# Patient Record
Sex: Female | Born: 1960 | Race: Black or African American | Hispanic: No | Smoking: Former smoker
Health system: Southern US, Community
[De-identification: ages and names within clinical notes are randomized; demographics above are authoritative.]

## PROBLEM LIST (undated history)

## (undated) DIAGNOSIS — I10 Essential (primary) hypertension: Secondary | ICD-10-CM

## (undated) DIAGNOSIS — I700 Atherosclerosis of aorta: Secondary | ICD-10-CM

## (undated) DIAGNOSIS — N186 End stage renal disease: Secondary | ICD-10-CM

## (undated) DIAGNOSIS — I63342 Cerebral infarction due to thrombosis of left cerebellar artery: Secondary | ICD-10-CM

## (undated) DIAGNOSIS — D509 Iron deficiency anemia, unspecified: Secondary | ICD-10-CM

## (undated) DIAGNOSIS — K429 Umbilical hernia without obstruction or gangrene: Secondary | ICD-10-CM

## (undated) DIAGNOSIS — D259 Leiomyoma of uterus, unspecified: Secondary | ICD-10-CM

## (undated) DIAGNOSIS — R110 Nausea: Secondary | ICD-10-CM

## (undated) DIAGNOSIS — K5790 Diverticulosis of intestine, part unspecified, without perforation or abscess without bleeding: Secondary | ICD-10-CM

## (undated) DIAGNOSIS — E876 Hypokalemia: Secondary | ICD-10-CM

## (undated) DIAGNOSIS — K219 Gastro-esophageal reflux disease without esophagitis: Secondary | ICD-10-CM

## (undated) DIAGNOSIS — E785 Hyperlipidemia, unspecified: Secondary | ICD-10-CM

## (undated) DIAGNOSIS — M1710 Unilateral primary osteoarthritis, unspecified knee: Secondary | ICD-10-CM

## (undated) DIAGNOSIS — I4729 Other ventricular tachycardia: Secondary | ICD-10-CM

## (undated) DIAGNOSIS — I472 Ventricular tachycardia, unspecified: Secondary | ICD-10-CM

## (undated) DIAGNOSIS — E1143 Type 2 diabetes mellitus with diabetic autonomic (poly)neuropathy: Secondary | ICD-10-CM

## (undated) DIAGNOSIS — N289 Disorder of kidney and ureter, unspecified: Secondary | ICD-10-CM

## (undated) DIAGNOSIS — Z972 Presence of dental prosthetic device (complete) (partial): Secondary | ICD-10-CM

## (undated) HISTORY — DX: Essential (primary) hypertension: I10

## (undated) HISTORY — PX: CATARACT EXTRACTION: SUR2

## (undated) HISTORY — DX: Atherosclerosis of aorta: I70.0

## (undated) HISTORY — PX: REDUCTION MAMMAPLASTY: SUR839

## (undated) HISTORY — PX: CHOLECYSTECTOMY: SHX55

## (undated) HISTORY — DX: Type 2 diabetes mellitus with diabetic autonomic (poly)neuropathy: E11.43

## (undated) HISTORY — PX: MULTIPLE TOOTH EXTRACTIONS: SHX2053

## (undated) HISTORY — DX: Hyperlipidemia, unspecified: E78.5

## (undated) HISTORY — DX: Umbilical hernia without obstruction or gangrene: K42.9

## (undated) HISTORY — DX: Unilateral primary osteoarthritis, unspecified knee: M17.10

## (undated) HISTORY — DX: Other ventricular tachycardia: I47.29

## (undated) HISTORY — DX: Iron deficiency anemia, unspecified: D50.9

## (undated) HISTORY — DX: Nausea: R11.0

## (undated) HISTORY — DX: Hypokalemia: E87.6

## (undated) HISTORY — PX: BREAST SURGERY: SHX581

## (undated) HISTORY — DX: Ventricular tachycardia, unspecified: I47.20

## (undated) HISTORY — DX: Diverticulosis of intestine, part unspecified, without perforation or abscess without bleeding: K57.90

## (undated) HISTORY — DX: Leiomyoma of uterus, unspecified: D25.9

## (undated) HISTORY — DX: Gastro-esophageal reflux disease without esophagitis: K21.9

---

## 2000-08-28 ENCOUNTER — Encounter: Payer: Self-pay | Admitting: Emergency Medicine

## 2000-08-28 ENCOUNTER — Emergency Department (HOSPITAL_COMMUNITY): Admission: EM | Admit: 2000-08-28 | Discharge: 2000-08-28 | Payer: Self-pay | Admitting: Emergency Medicine

## 2000-11-02 ENCOUNTER — Emergency Department (HOSPITAL_COMMUNITY): Admission: EM | Admit: 2000-11-02 | Discharge: 2000-11-02 | Payer: Self-pay | Admitting: Emergency Medicine

## 2001-03-22 ENCOUNTER — Emergency Department (HOSPITAL_COMMUNITY): Admission: EM | Admit: 2001-03-22 | Discharge: 2001-03-22 | Payer: Self-pay | Admitting: Emergency Medicine

## 2001-06-07 ENCOUNTER — Encounter: Payer: Self-pay | Admitting: Emergency Medicine

## 2001-06-07 ENCOUNTER — Emergency Department (HOSPITAL_COMMUNITY): Admission: EM | Admit: 2001-06-07 | Discharge: 2001-06-07 | Payer: Self-pay | Admitting: Emergency Medicine

## 2001-06-13 ENCOUNTER — Emergency Department (HOSPITAL_COMMUNITY): Admission: EM | Admit: 2001-06-13 | Discharge: 2001-06-13 | Payer: Self-pay | Admitting: Emergency Medicine

## 2001-09-06 ENCOUNTER — Encounter: Payer: Self-pay | Admitting: Emergency Medicine

## 2001-09-06 ENCOUNTER — Emergency Department (HOSPITAL_COMMUNITY): Admission: EM | Admit: 2001-09-06 | Discharge: 2001-09-06 | Payer: Self-pay | Admitting: Emergency Medicine

## 2001-10-16 ENCOUNTER — Emergency Department (HOSPITAL_COMMUNITY): Admission: EM | Admit: 2001-10-16 | Discharge: 2001-10-16 | Payer: Self-pay | Admitting: Emergency Medicine

## 2002-03-28 ENCOUNTER — Emergency Department (HOSPITAL_COMMUNITY): Admission: EM | Admit: 2002-03-28 | Discharge: 2002-03-28 | Payer: Self-pay | Admitting: Emergency Medicine

## 2003-01-23 ENCOUNTER — Emergency Department (HOSPITAL_COMMUNITY): Admission: EM | Admit: 2003-01-23 | Discharge: 2003-01-23 | Payer: Self-pay | Admitting: Emergency Medicine

## 2004-02-29 ENCOUNTER — Emergency Department (HOSPITAL_COMMUNITY): Admission: EM | Admit: 2004-02-29 | Discharge: 2004-02-29 | Payer: Self-pay | Admitting: Emergency Medicine

## 2004-07-17 IMAGING — CR DG CHEST 1V
1 series · 1 of 1 positions shown · non-contrast
Comparison: None.

CLINICAL DATA: Weakness and headache, code stroke.
 CHEST - 1 VIEW:

[view not recorded]
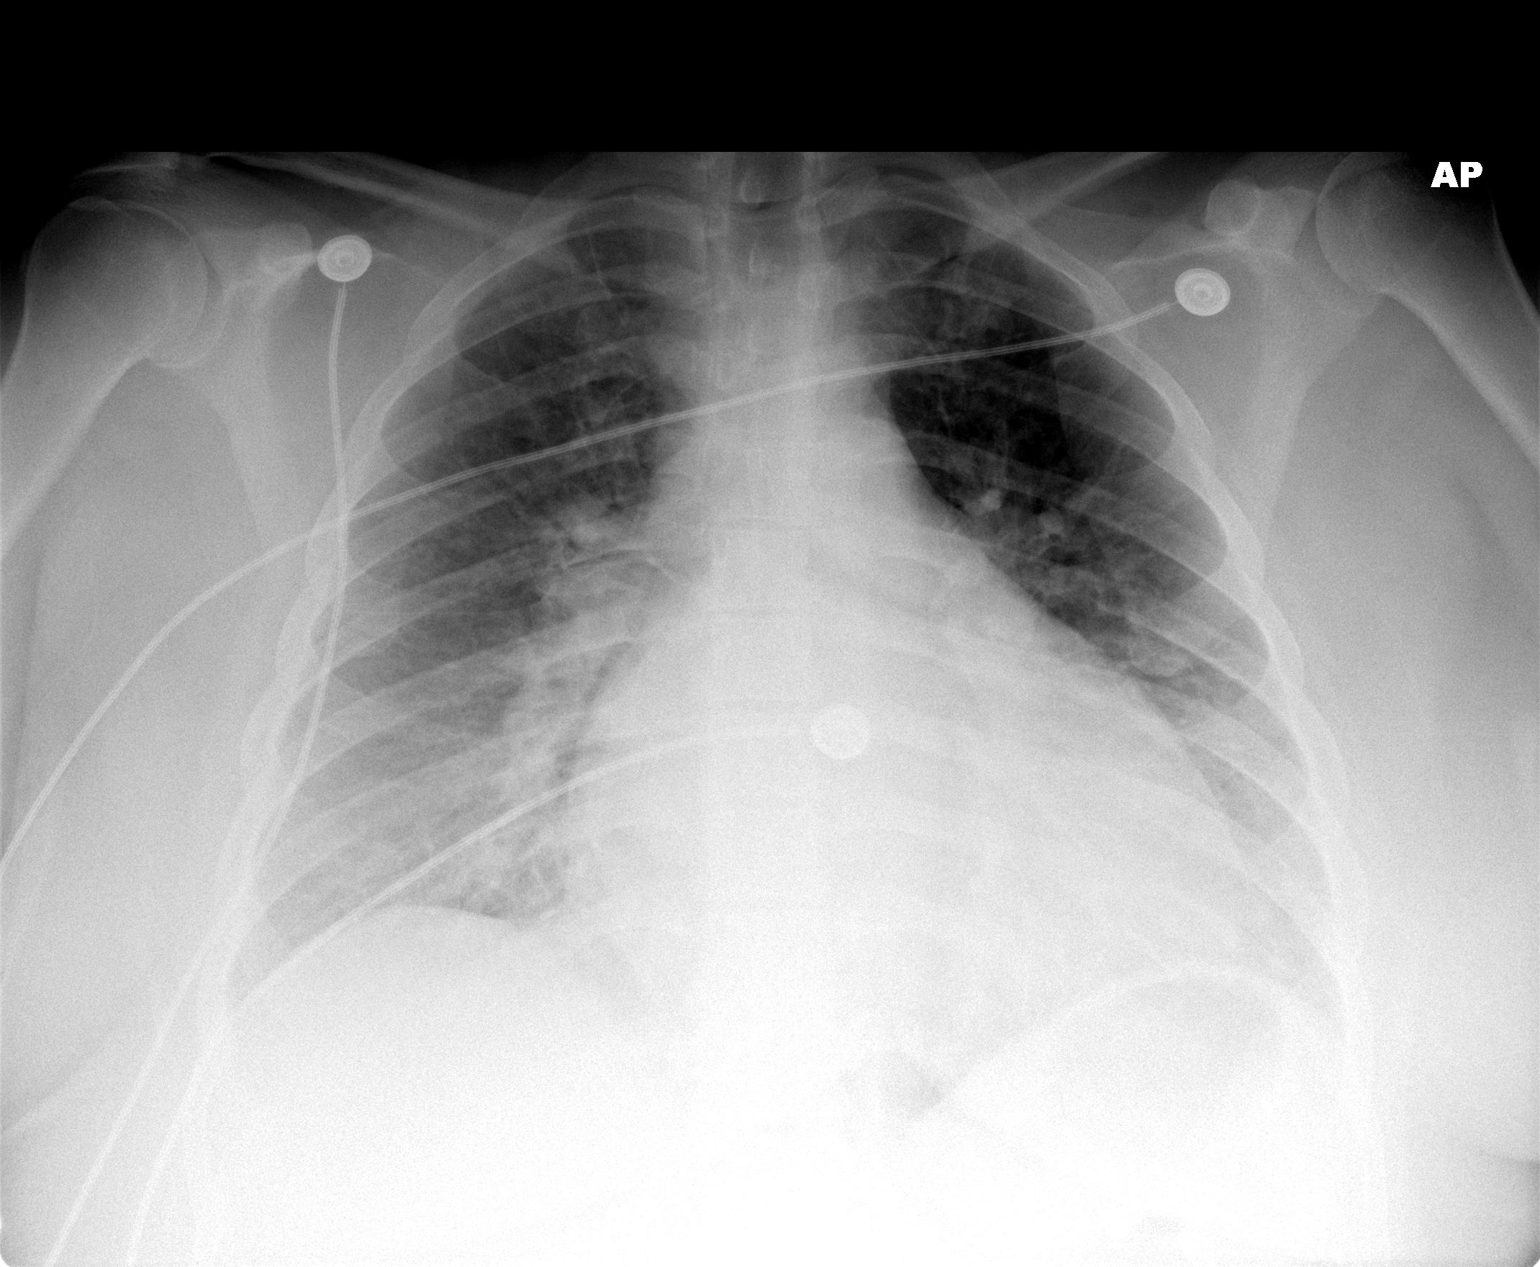

[1 of 1 positions shown; findings below may reference images not displayed]

FINDINGS: Cardiomediastinal silhouette is borderline enlarged.  No definite focal air space opacity is seen; generalized haziness over the bilateral lower lobes is most likely due to overlying soft tissue.  Osseous structures are grossly intact.
IMPRESSION: 1.  Borderline enlarged cardiomediastinal silhouette.
 2.  No definite focal air space opacity.

## 2005-01-23 ENCOUNTER — Emergency Department (HOSPITAL_COMMUNITY): Admission: EM | Admit: 2005-01-23 | Discharge: 2005-01-24 | Payer: Self-pay | Admitting: Emergency Medicine

## 2005-01-23 IMAGING — CT CT HEAD W/O CM
1 series · 16 of 30 positions shown, 20 images · IV contrast (agent unspecified)
Comparison: none

CLINICAL DATA: Mental status changes, code stroke. 
 HEAD CT WITHOUT CONTRAST:
TECHNIQUE: Contiguous axial images were obtained from the base of the skull through the vertex according to standard protocol without contrast.
 There is no evidence of intracranial hemorrhage, brain edema, or mass effect.  No other intra-axial abnormalities are seen, and the ventricles are within normal limits.  No abnormal extra-axial fluid collections or masses are identified.  No skull abnormalities are noted.

[Series 2: head_seq 4.5 h42s st · axial · 0.43mm/px · z∈[+1169,+1313]mm · 16 of 36 slices shown, 20 images]
[im 2/36  brain]
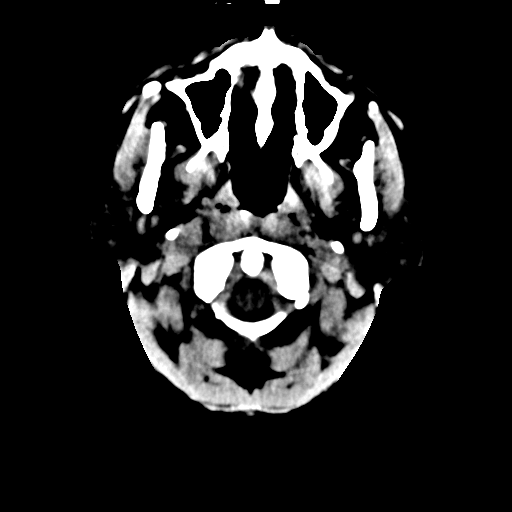
[im 2/36  bone]
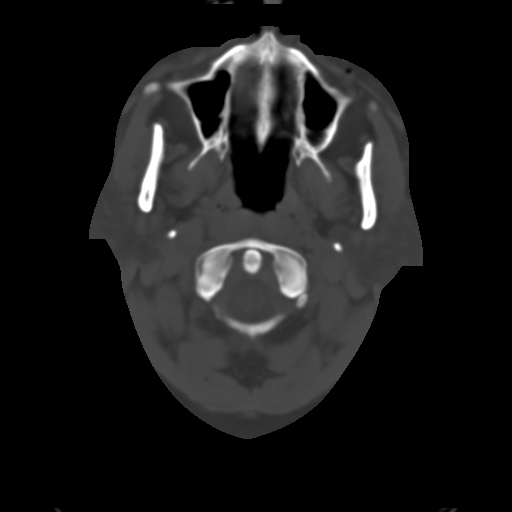
[im 4/36  brain]
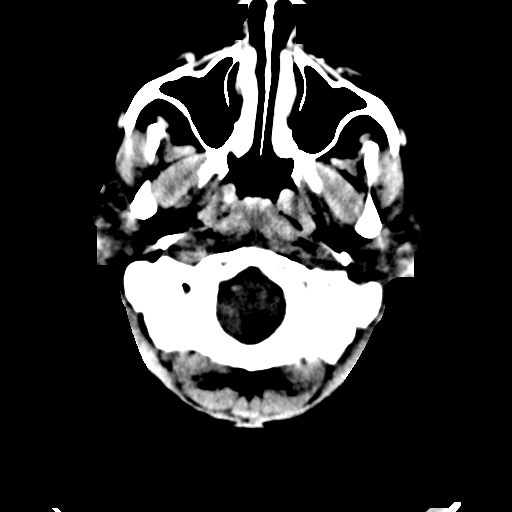
[im 7/36  brain]
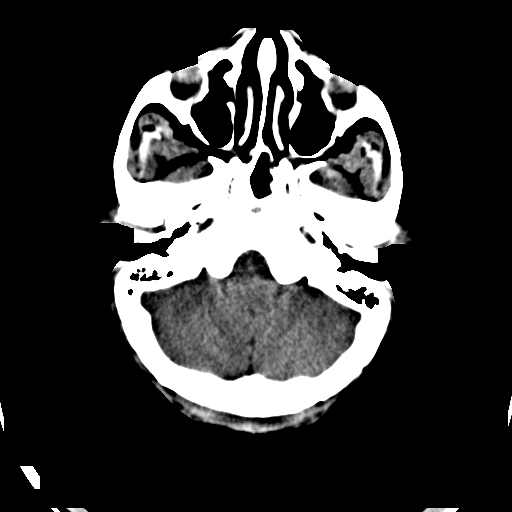
[im 9/36  brain]
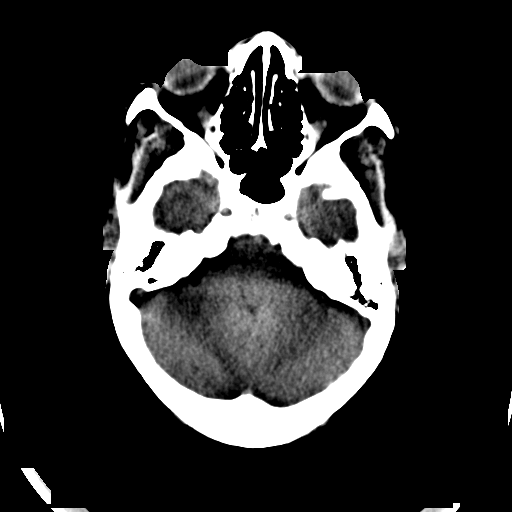
[im 10/36  brain]
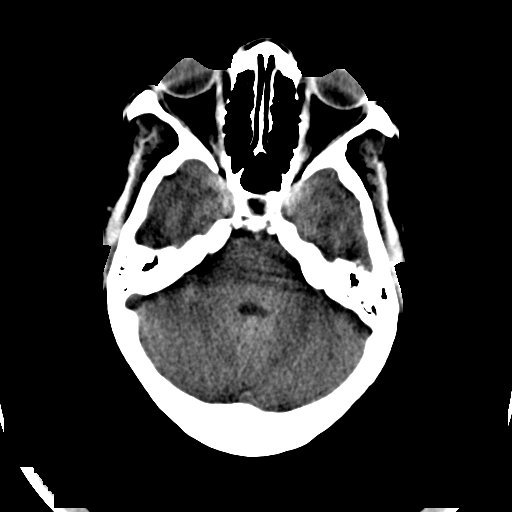
[im 10/36  bone]
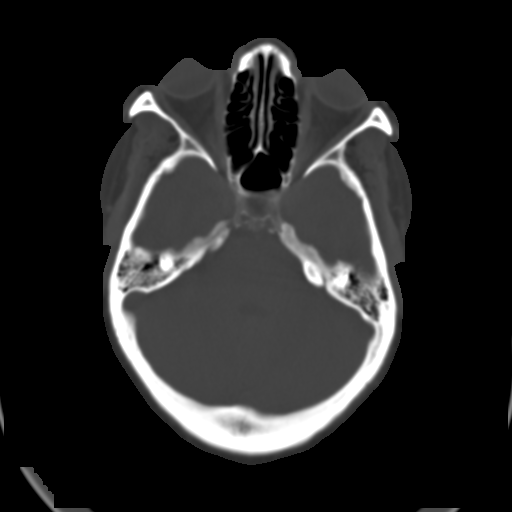
[im 13/36  brain]
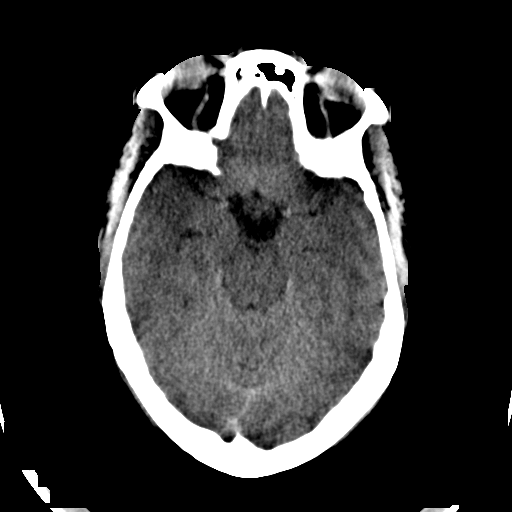
[im 15/36  brain]
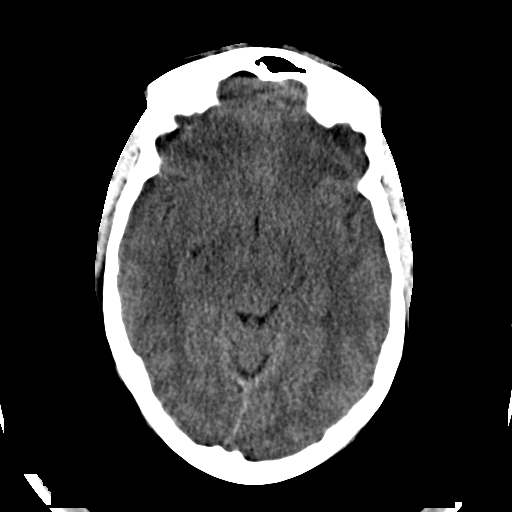
[im 17/36  brain]
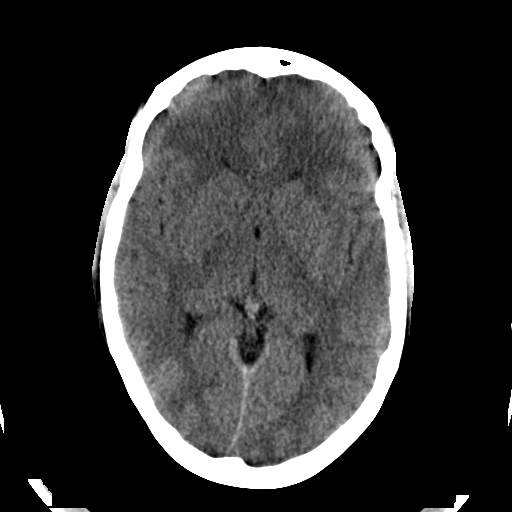
[im 19/36  brain]
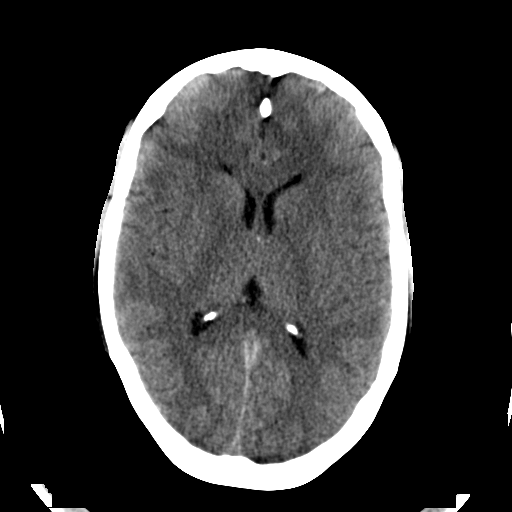
[im 19/36  bone]
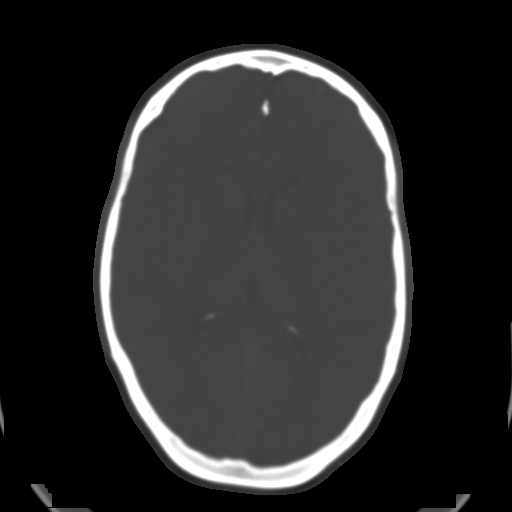
[im 21/36  brain]
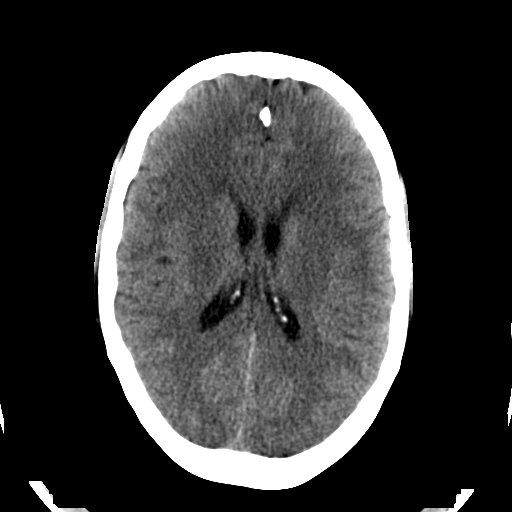
[im 23/36  brain]
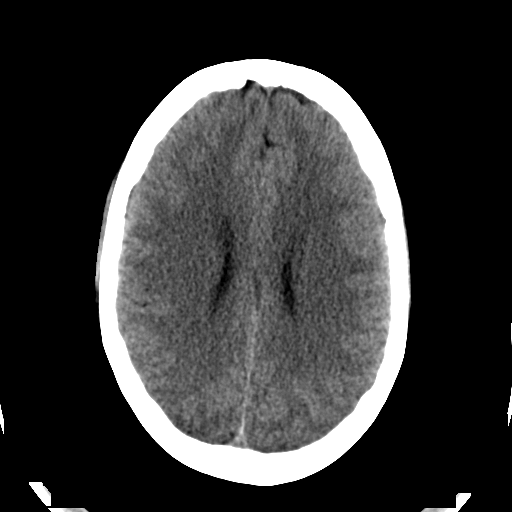
[im 26/36  brain]
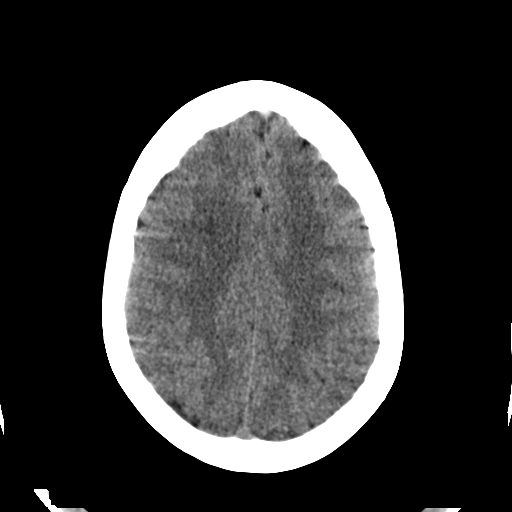
[im 27/36  brain]
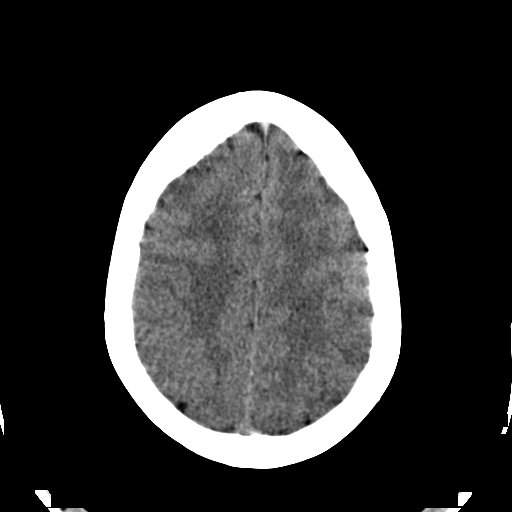
[im 27/36  bone]
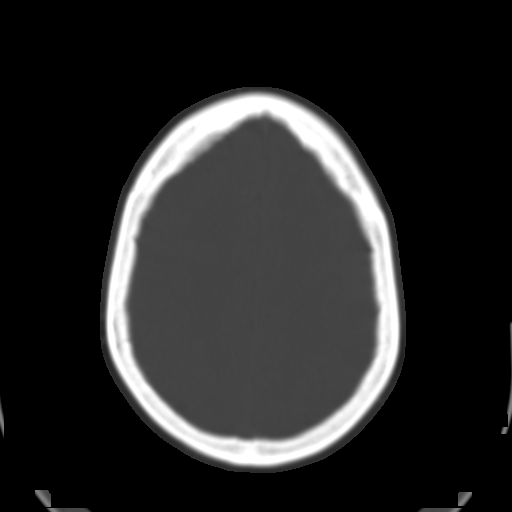
[im 29/36  brain]
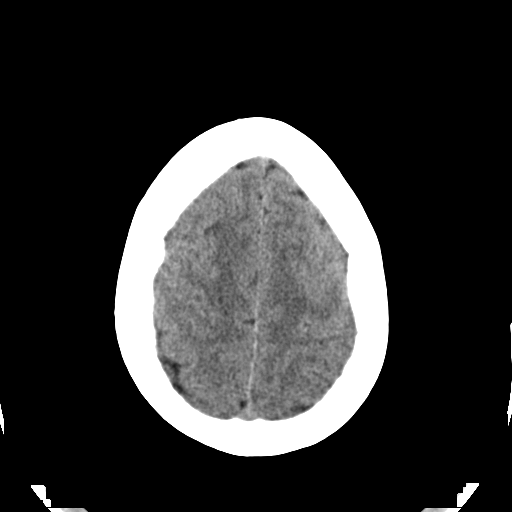
[im 32/36  brain]
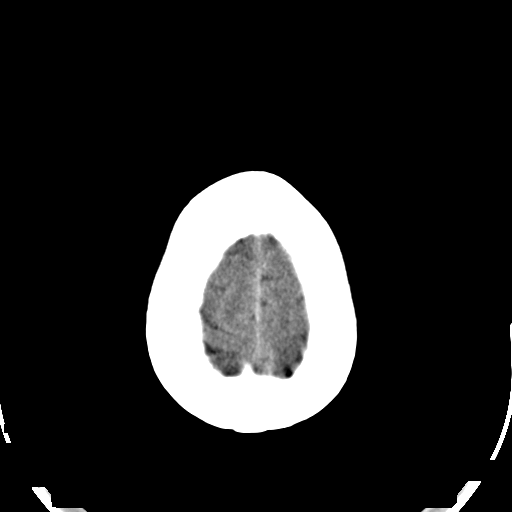
[im 34/36  brain]
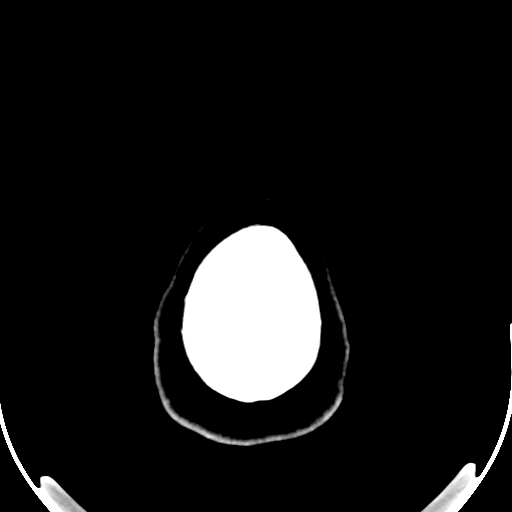

[16 of 30 positions shown; findings below may reference images not displayed]

IMPRESSION: Negative non-contrast head CT.

## 2005-06-04 ENCOUNTER — Emergency Department (HOSPITAL_COMMUNITY): Admission: EM | Admit: 2005-06-04 | Discharge: 2005-06-04 | Payer: Self-pay | Admitting: Emergency Medicine

## 2005-06-15 ENCOUNTER — Emergency Department (HOSPITAL_COMMUNITY): Admission: EM | Admit: 2005-06-15 | Discharge: 2005-06-15 | Payer: Self-pay | Admitting: Emergency Medicine

## 2005-08-06 ENCOUNTER — Emergency Department (HOSPITAL_COMMUNITY): Admission: EM | Admit: 2005-08-06 | Discharge: 2005-08-06 | Payer: Self-pay | Admitting: Emergency Medicine

## 2005-08-16 ENCOUNTER — Emergency Department (HOSPITAL_COMMUNITY): Admission: EM | Admit: 2005-08-16 | Discharge: 2005-08-16 | Payer: Self-pay | Admitting: Family Medicine

## 2005-08-27 ENCOUNTER — Ambulatory Visit: Payer: Self-pay | Admitting: Family Medicine

## 2005-09-03 ENCOUNTER — Encounter (INDEPENDENT_AMBULATORY_CARE_PROVIDER_SITE_OTHER): Payer: Self-pay | Admitting: *Deleted

## 2005-09-24 ENCOUNTER — Other Ambulatory Visit: Admission: RE | Admit: 2005-09-24 | Discharge: 2005-09-24 | Payer: Self-pay | Admitting: Family Medicine

## 2005-09-24 ENCOUNTER — Ambulatory Visit: Payer: Self-pay | Admitting: Family Medicine

## 2005-09-26 ENCOUNTER — Ambulatory Visit: Payer: Self-pay | Admitting: Sports Medicine

## 2006-01-22 ENCOUNTER — Ambulatory Visit: Payer: Self-pay | Admitting: Family Medicine

## 2006-01-22 ENCOUNTER — Ambulatory Visit (HOSPITAL_COMMUNITY): Admission: RE | Admit: 2006-01-22 | Discharge: 2006-01-22 | Payer: Self-pay | Admitting: Family Medicine

## 2006-01-22 IMAGING — CR DG ABDOMEN ACUTE W/ 1V CHEST
4 series · 4 of 4 positions shown · non-contrast
Comparison: none

CLINICAL DATA: Abdominal pain, history of cholecystectomy.  
 ACUTE ABDOMINAL SERIES WITH CHEST:

[view not recorded (1 of 4)]
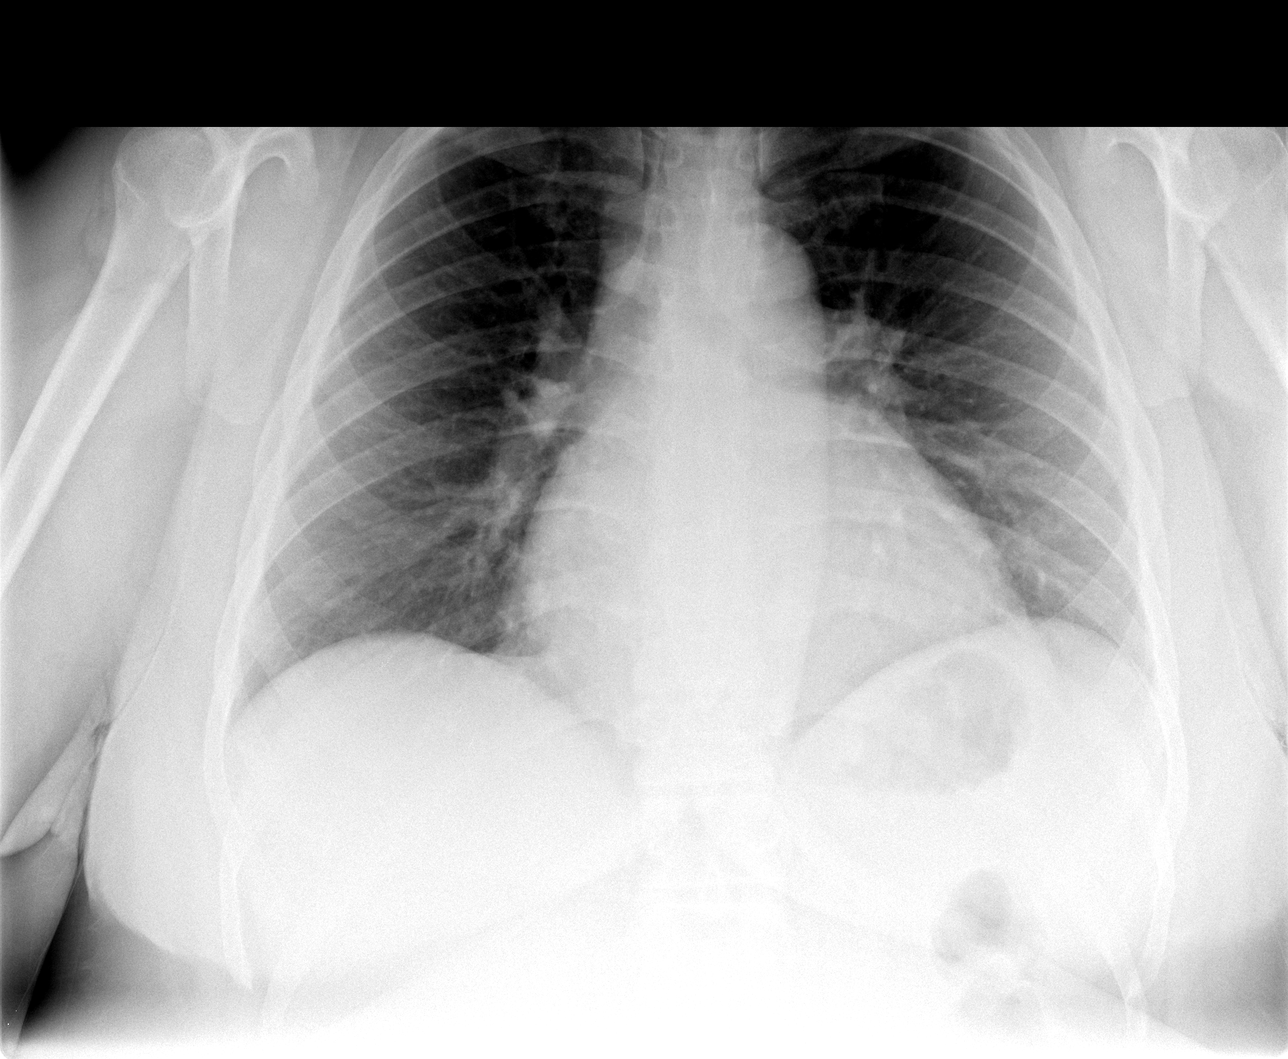

[view not recorded (2 of 4)]
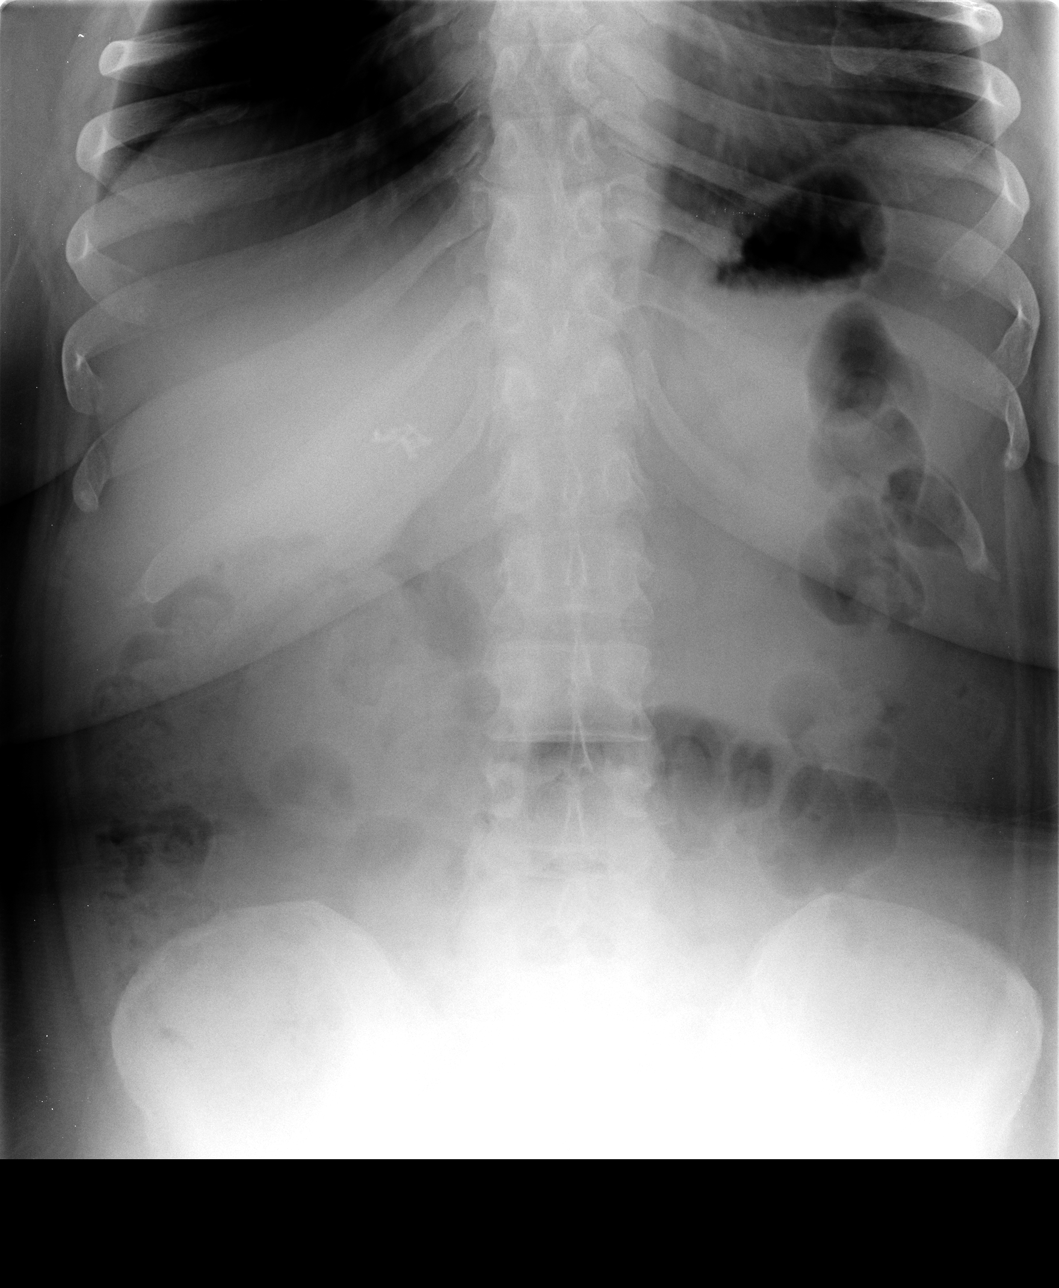

[view not recorded (3 of 4)]
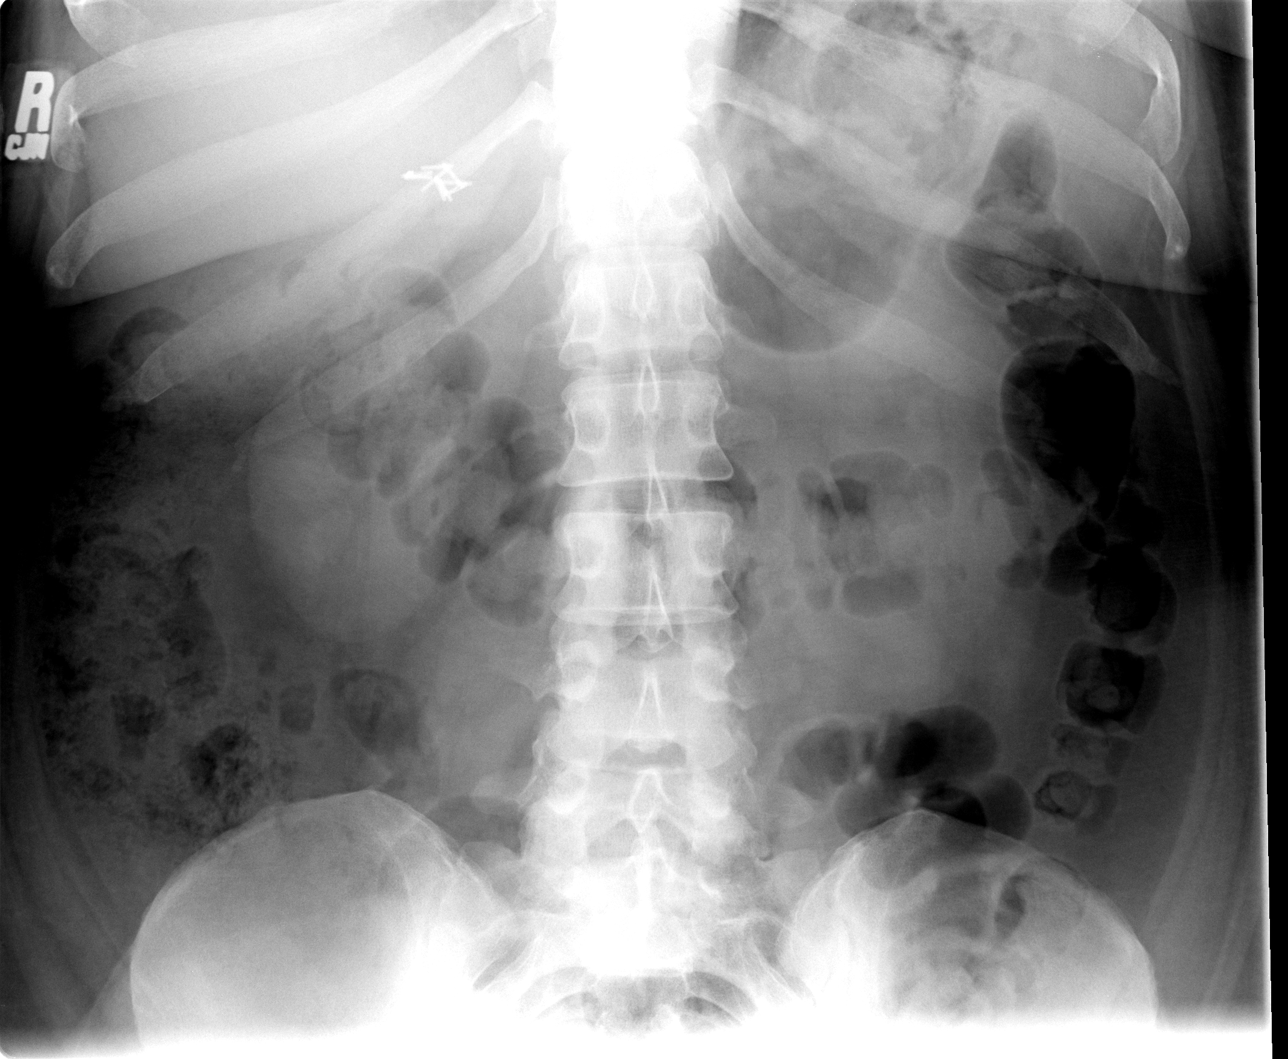

[view not recorded (4 of 4)]
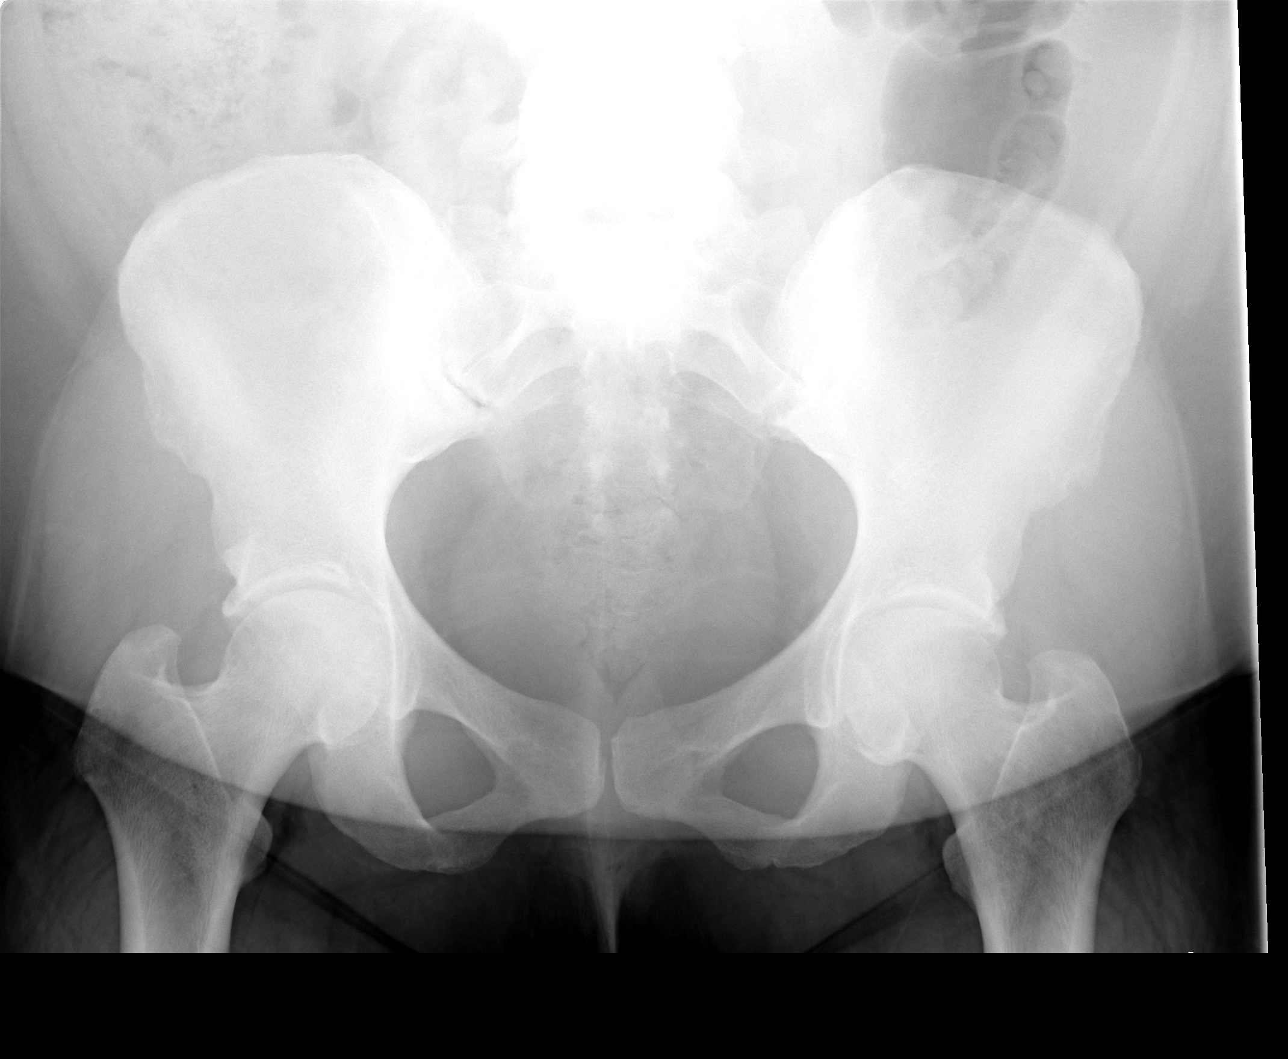

[4 of 4 positions shown; findings below may reference images not displayed]

FINDINGS: The bowel gas pattern is normal.  There are surgical clips within the upper quadrant from a prior cholecystectomy.  There is no evidence for free peritoneal air.  
 The upright chest view included in the study demonstrates borderline cardiomegaly.  There are no infiltrative or edematous changes.
IMPRESSION: 1.  Normal bowel gas pattern and no evidence for free peritoneal air. 
 2.  Borderline cardiomegaly.

## 2006-03-23 ENCOUNTER — Emergency Department (HOSPITAL_COMMUNITY): Admission: EM | Admit: 2006-03-23 | Discharge: 2006-03-24 | Payer: Self-pay | Admitting: Emergency Medicine

## 2006-03-23 IMAGING — CR DG NECK SOFT TISSUE
2 series · 2 of 2 positions shown · non-contrast
Comparison: none

CLINICAL DATA: The patient has food stuck in throat.
 LATERAL NECK SOFT TISSUES - 1 VIEW:

[w soft tissue neck * (1 of 2)]
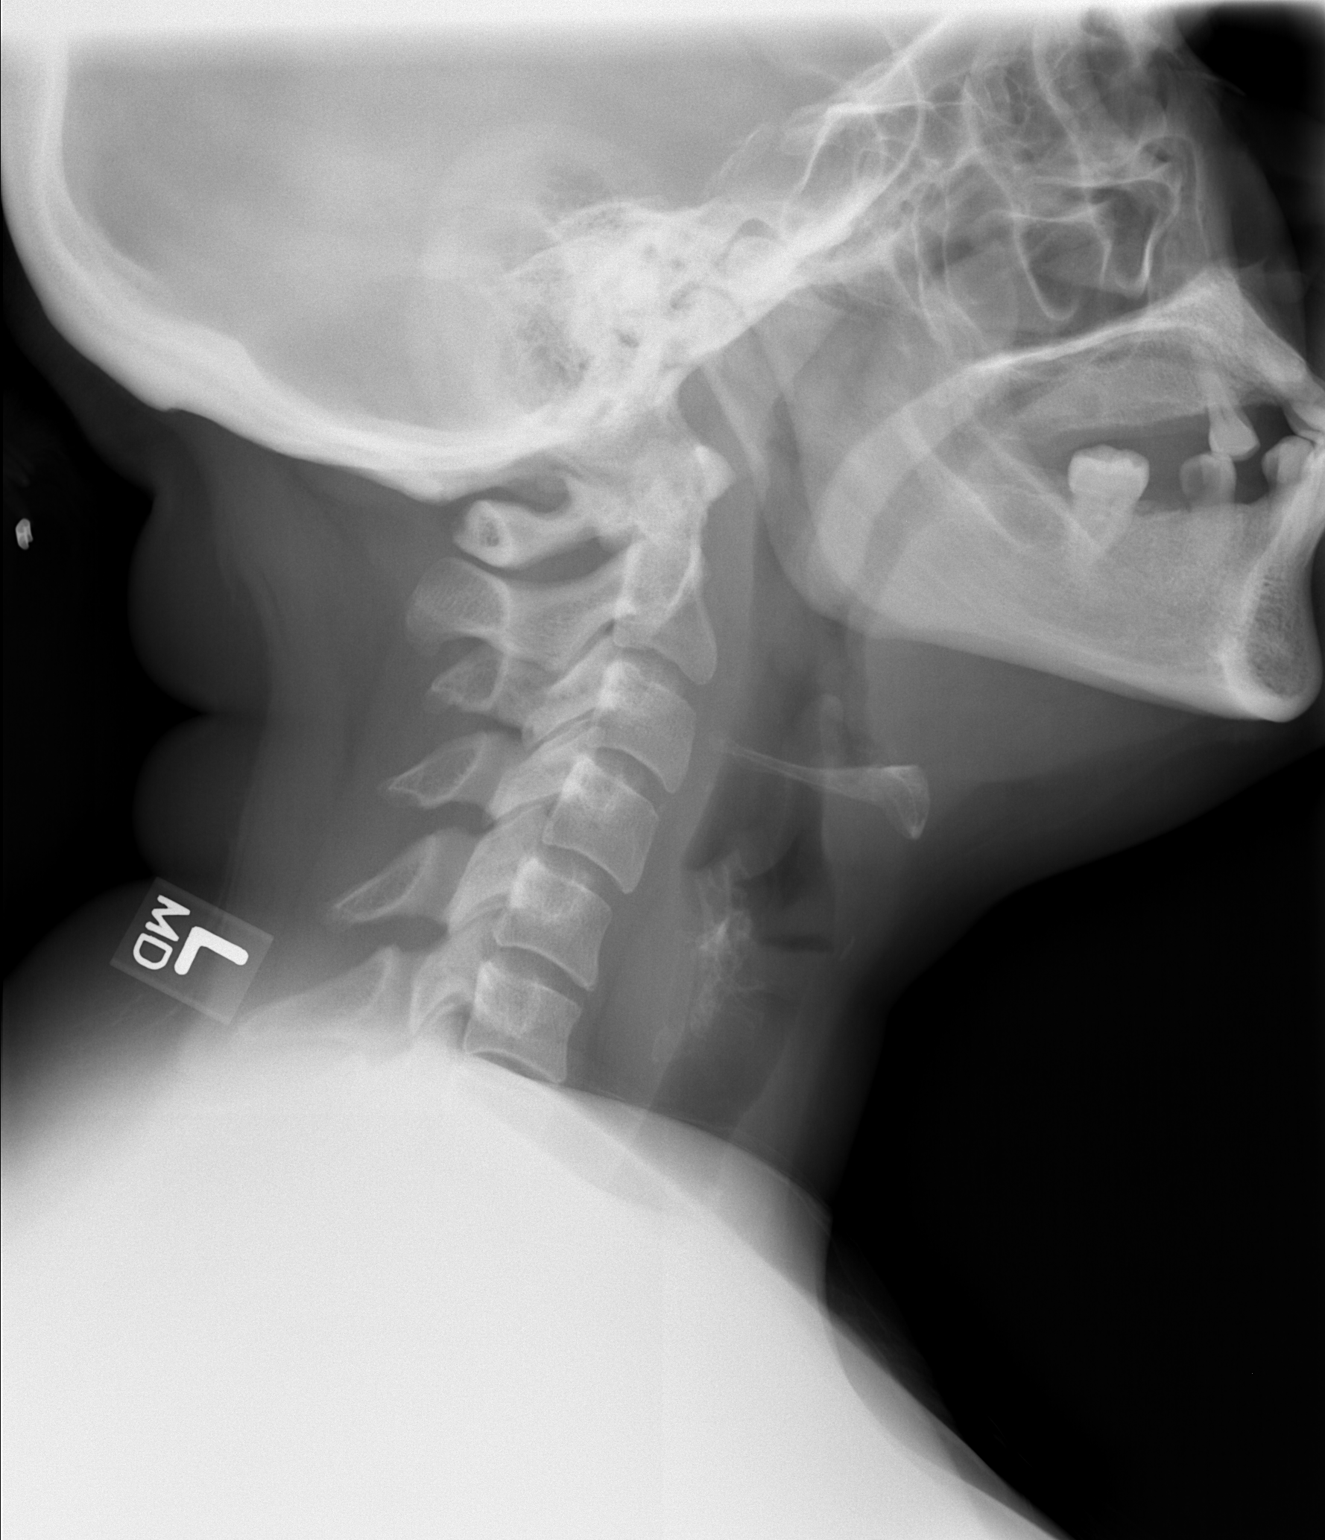

[w soft tissue neck * (2 of 2)]
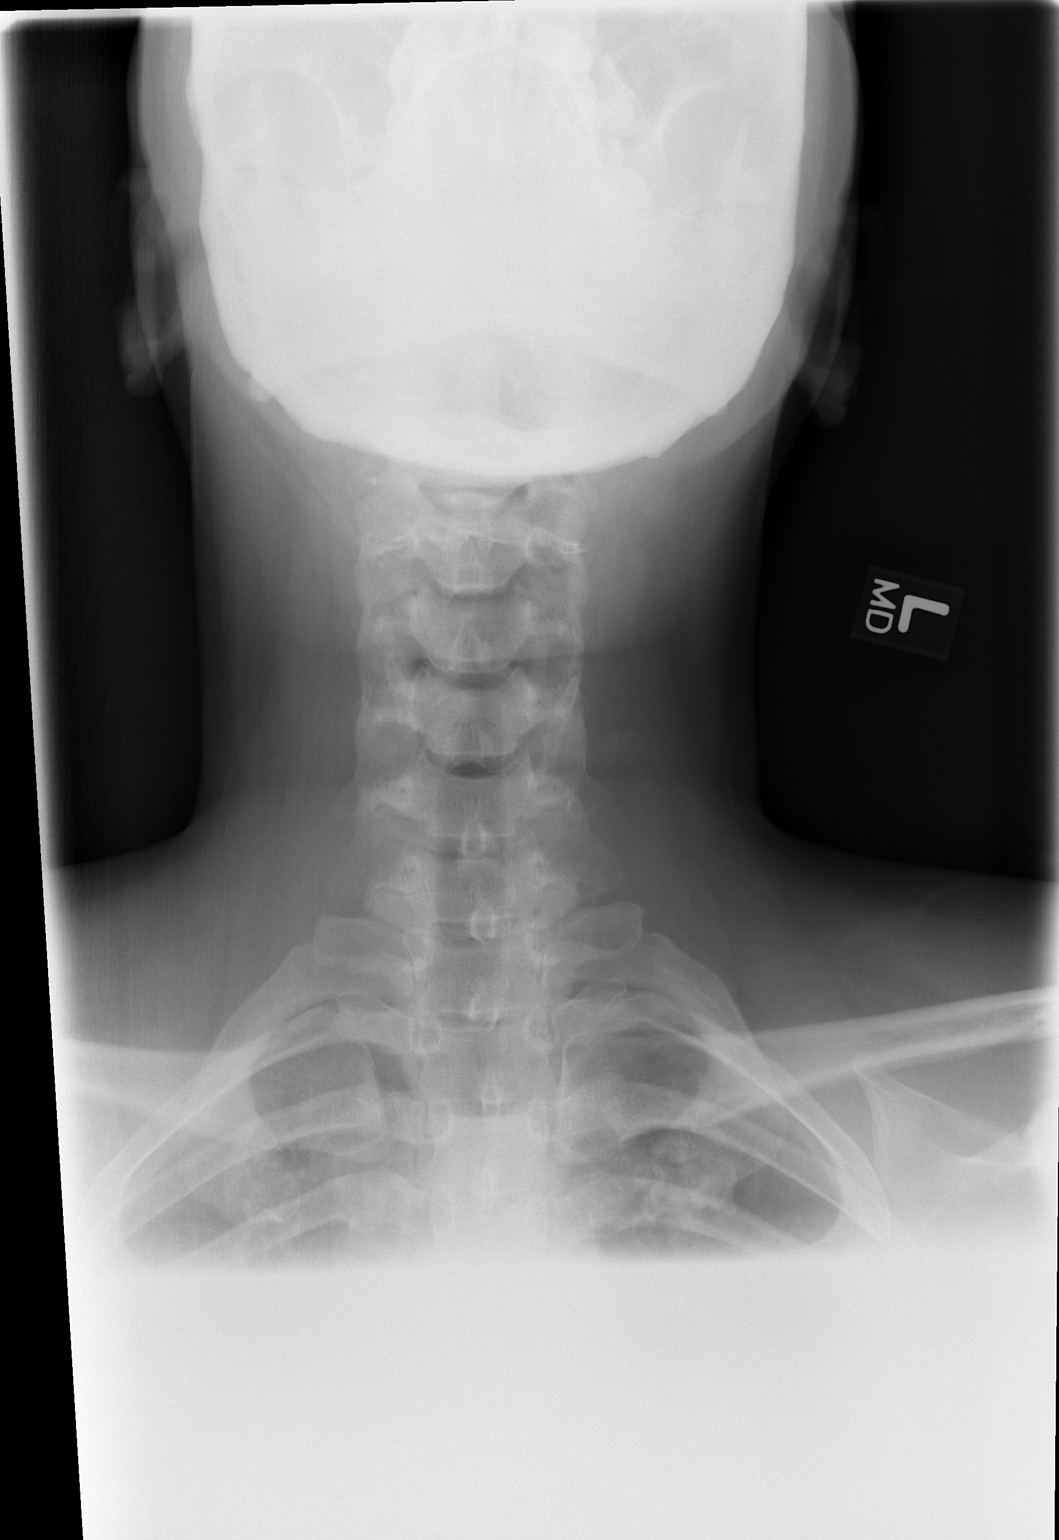

[2 of 2 positions shown; findings below may reference images not displayed]

FINDINGS: There is no radiopaque foreign body or food substance seen within the esophagus or tracheal air column.  Straightening of the cervical spine is noted.
IMPRESSION: Negative for obstructing food substance.

## 2006-07-02 ENCOUNTER — Emergency Department (HOSPITAL_COMMUNITY): Admission: EM | Admit: 2006-07-02 | Discharge: 2006-07-02 | Payer: Self-pay | Admitting: Emergency Medicine

## 2006-07-03 DIAGNOSIS — G44209 Tension-type headache, unspecified, not intractable: Secondary | ICD-10-CM | POA: Insufficient documentation

## 2006-07-03 DIAGNOSIS — Z87891 Personal history of nicotine dependence: Secondary | ICD-10-CM

## 2006-07-03 DIAGNOSIS — I10 Essential (primary) hypertension: Secondary | ICD-10-CM

## 2006-07-03 HISTORY — DX: Essential (primary) hypertension: I10

## 2006-07-04 ENCOUNTER — Encounter (INDEPENDENT_AMBULATORY_CARE_PROVIDER_SITE_OTHER): Payer: Self-pay | Admitting: *Deleted

## 2006-07-04 ENCOUNTER — Ambulatory Visit: Payer: Self-pay | Admitting: Family Medicine

## 2006-07-14 ENCOUNTER — Telehealth: Payer: Self-pay | Admitting: *Deleted

## 2006-10-16 ENCOUNTER — Telehealth: Payer: Self-pay | Admitting: *Deleted

## 2006-10-16 ENCOUNTER — Ambulatory Visit: Payer: Self-pay | Admitting: Family Medicine

## 2006-10-16 ENCOUNTER — Encounter (INDEPENDENT_AMBULATORY_CARE_PROVIDER_SITE_OTHER): Payer: Self-pay | Admitting: Family Medicine

## 2006-10-16 DIAGNOSIS — R7989 Other specified abnormal findings of blood chemistry: Secondary | ICD-10-CM | POA: Insufficient documentation

## 2006-10-16 LAB — CONVERTED CEMR LAB
BUN: 9 mg/dL (ref 6–23)
Bilirubin Urine: NEGATIVE
Blood Glucose, Fingerstick: 111
Blood in Urine, dipstick: NEGATIVE
CO2: 22 meq/L (ref 19–32)
Calcium: 8.9 mg/dL (ref 8.4–10.5)
Glucose, Bld: 90 mg/dL (ref 70–99)
Glucose, Urine, Semiquant: NEGATIVE
Hgb A1c MFr Bld: 6.2 %
Ketones, urine, test strip: NEGATIVE
Specific Gravity, Urine: 1.025
pH: 6.5

## 2006-10-29 ENCOUNTER — Emergency Department (HOSPITAL_COMMUNITY): Admission: EM | Admit: 2006-10-29 | Discharge: 2006-10-30 | Payer: Self-pay | Admitting: Emergency Medicine

## 2006-10-29 IMAGING — CT CT ANGIO CHEST
2 of 4 series · 19 of 36 positions shown · IV contrast (APPLIED)
Comparison: None.

CLINICAL DATA: Chest pain.   Shortness of breath and elevated D-dimer.  
 CT ANGIOGRAPHY OF CHEST:
TECHNIQUE: Multidetector CT imaging of the chest was performed during bolus injection of intravenous contrast.  Multiplanar CT angiographic image reconstructions were generated to evaluate the vascular anatomy.
 Contrast:  100 cc Omnipaque 300

[Series 4: pulm embolism 2.0 st · axial · 0.67mm/px · z∈[-376,-80]mm · 16 of 162 slices shown]
[im 7/162  lung]
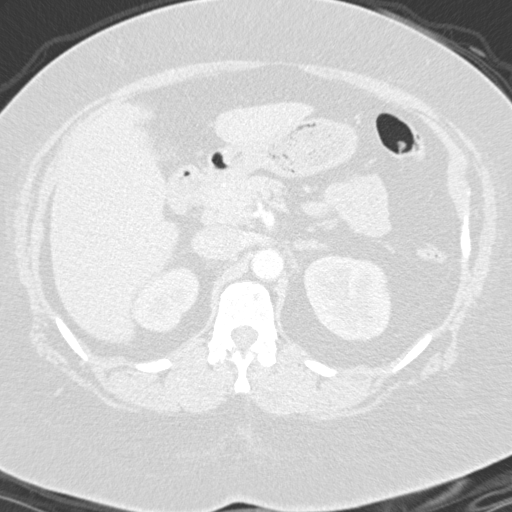
[im 19/162  mediastinal]
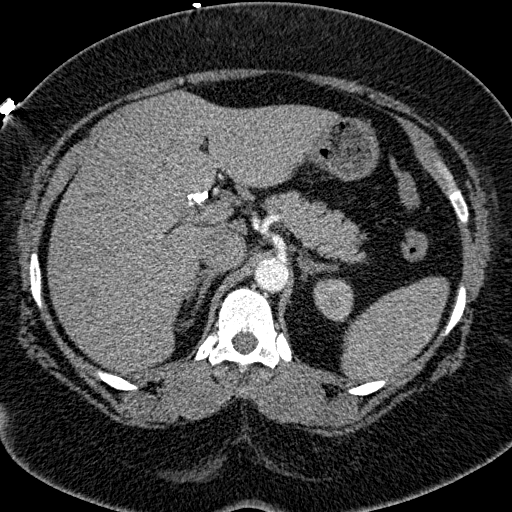
[im 25/162  lung]
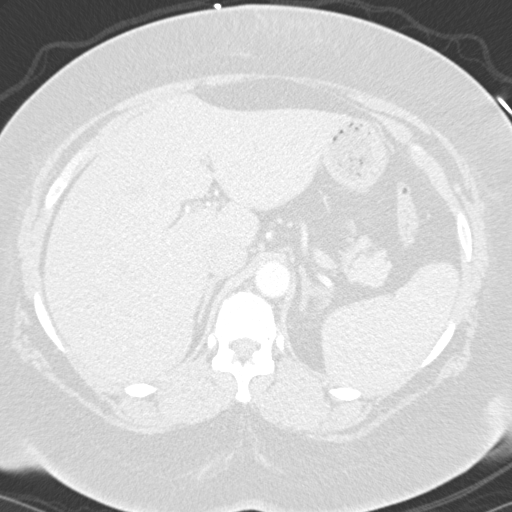
[im 38/162  mediastinal]
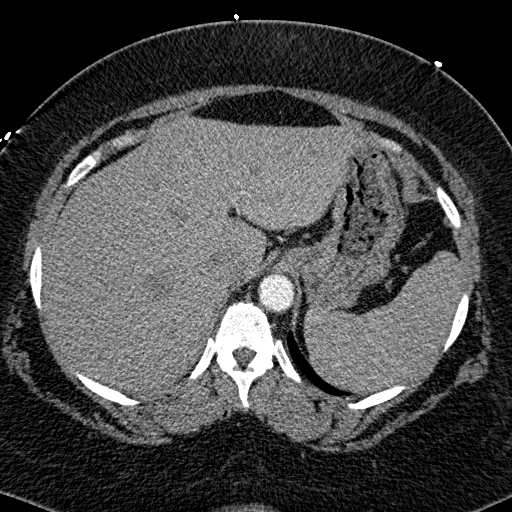
[im 50/162  lung]
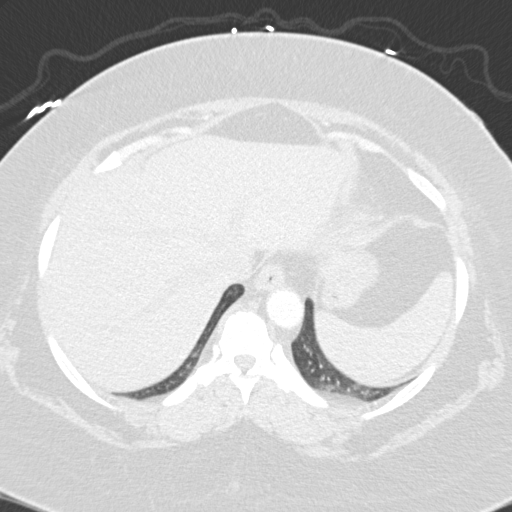
[im 56/162  mediastinal]
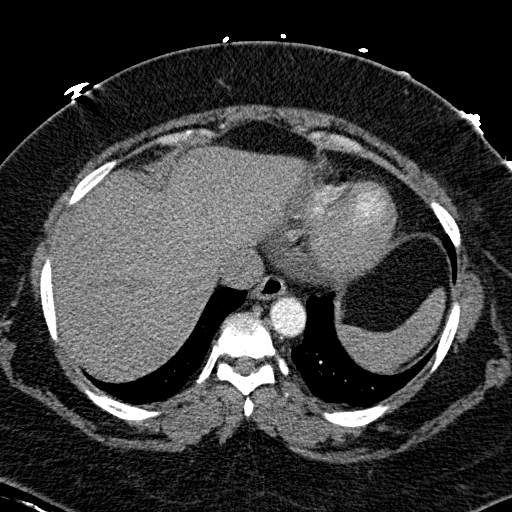
[im 69/162  lung]
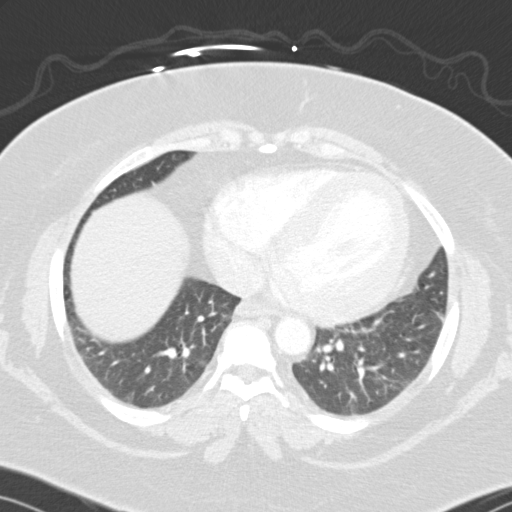
[im 75/162  mediastinal]
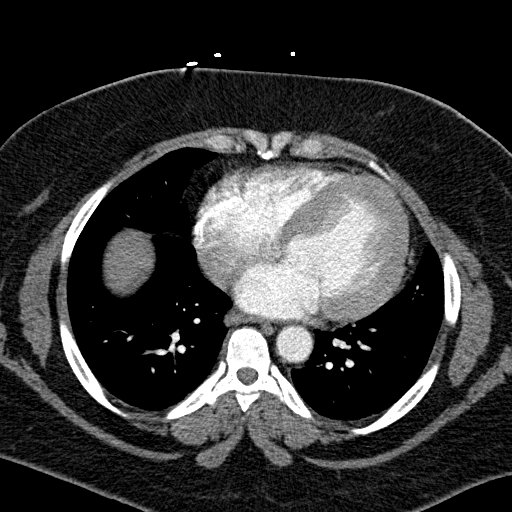
[im 87/162  lung]
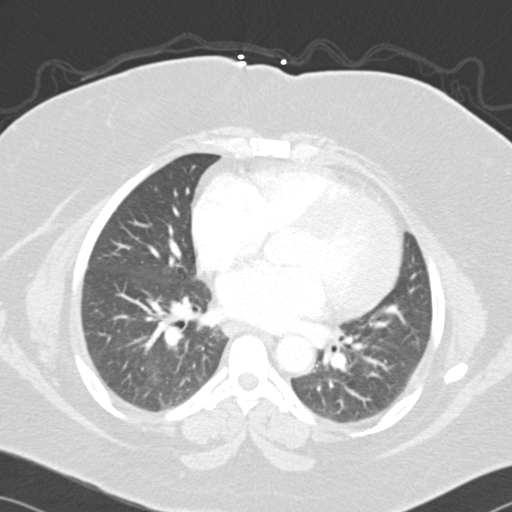
[im 93/162  mediastinal]
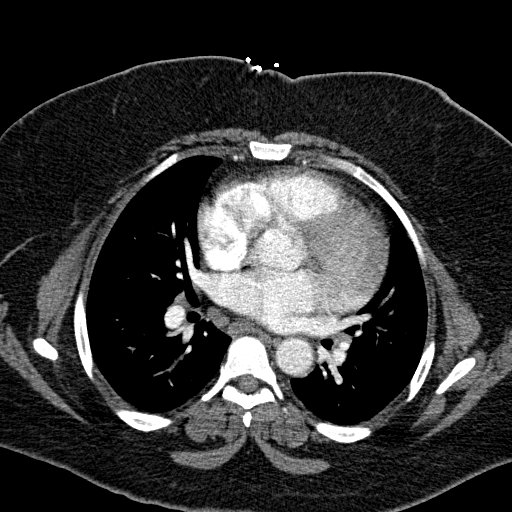
[im 106/162  lung]
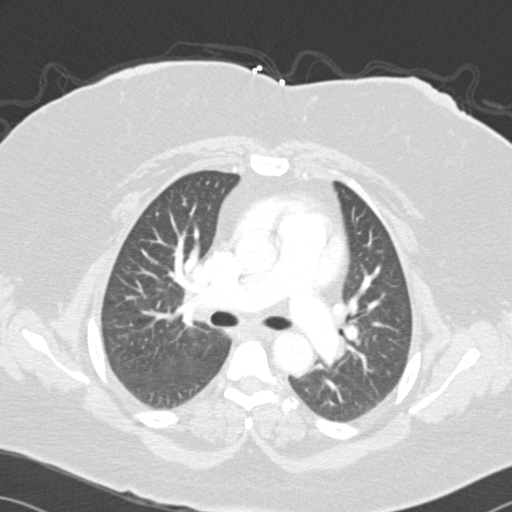
[im 112/162  mediastinal]
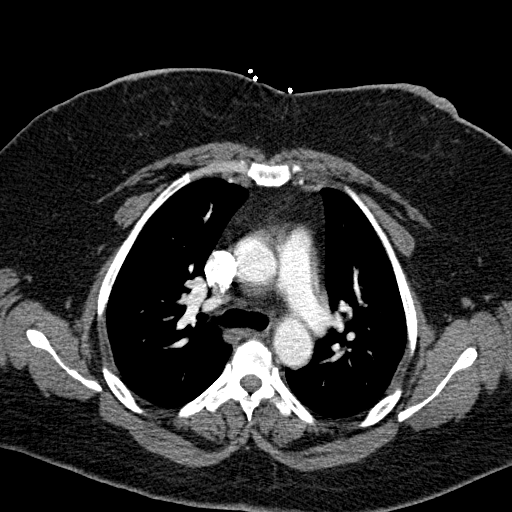
[im 124/162  lung]
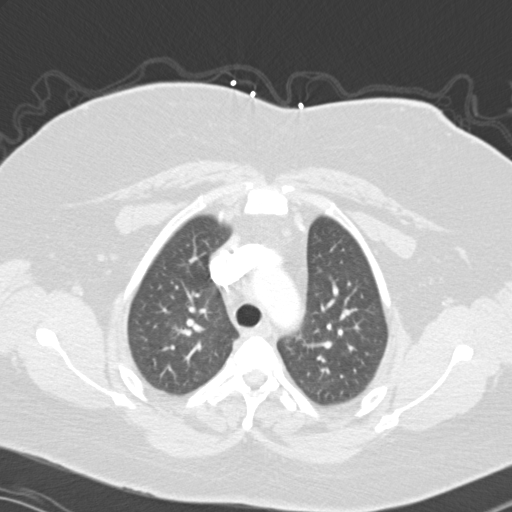
[im 137/162  mediastinal]
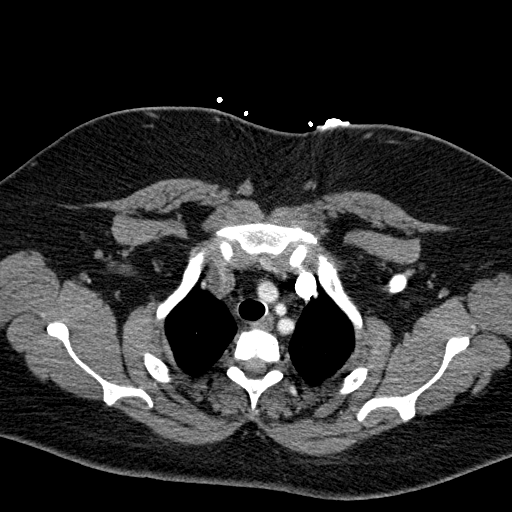
[im 143/162  lung]
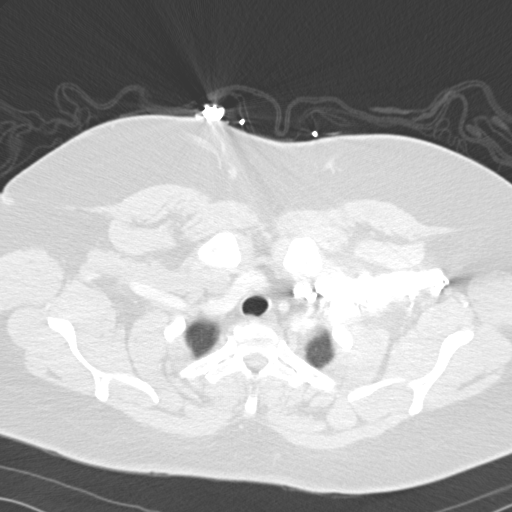
[im 155/162  mediastinal]
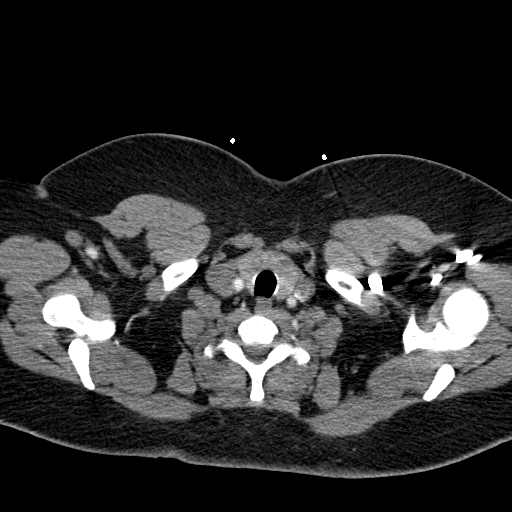

[Series 6: pulm embolism 2.0 cor · coronal · 0.63mm/px · 3 of 127 slices shown]
[im 26/127  mediastinal]
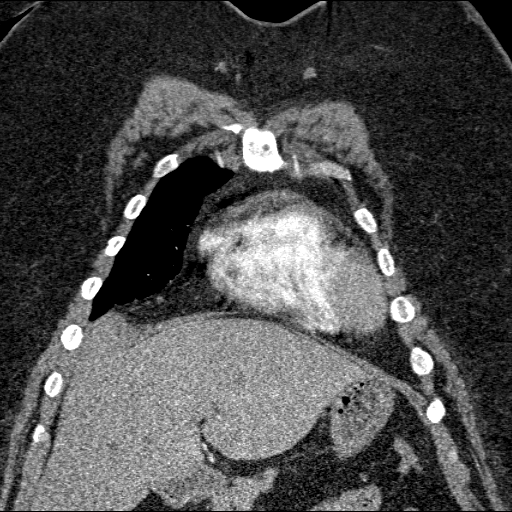
[im 51/127  mediastinal]
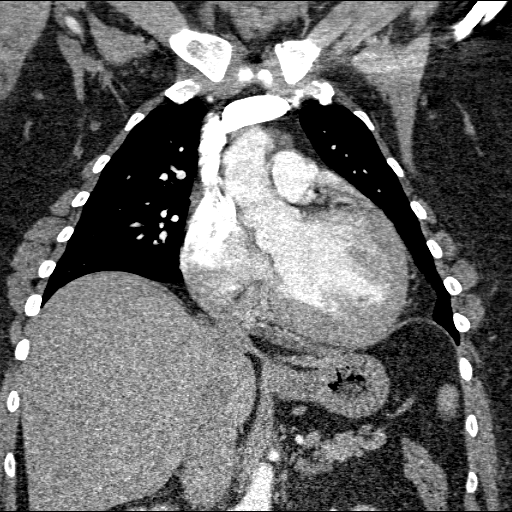
[im 76/127  mediastinal]
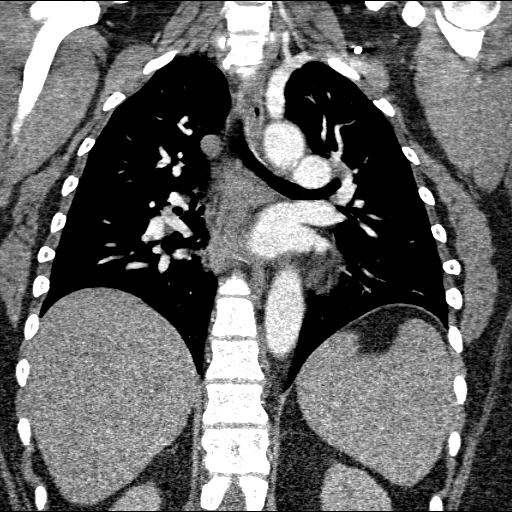

[19 of 36 positions shown; findings below may reference images not displayed]

FINDINGS: There is no CT evidence of pulmonary embolus.  No hilar, mediastinal, or axillary lymphadenopathy.  Heart size is enlarged.  No pleural or pericardial effusion.  The lungs demonstrate some mild dependent atelectatic change.  Lungs are otherwise clear.  Incidentally imaged upper abdomen demonstrates postoperative change of cholecystectomy.  Upper abdomen is otherwise unremarkable.  No focal bony abnormality.
IMPRESSION: 1.  Negative for pulmonary embolus or other acute process.
 2.  Cardiomegaly.

## 2007-01-13 ENCOUNTER — Telehealth: Payer: Self-pay | Admitting: *Deleted

## 2007-01-13 ENCOUNTER — Ambulatory Visit: Payer: Self-pay | Admitting: Family Medicine

## 2007-01-13 ENCOUNTER — Encounter: Admission: RE | Admit: 2007-01-13 | Discharge: 2007-01-13 | Payer: Self-pay | Admitting: Sports Medicine

## 2007-01-13 ENCOUNTER — Encounter (INDEPENDENT_AMBULATORY_CARE_PROVIDER_SITE_OTHER): Payer: Self-pay | Admitting: Family Medicine

## 2007-01-13 LAB — CONVERTED CEMR LAB
Basophils Absolute: 0 10*3/uL (ref 0.0–0.1)
CO2: 24 meq/L (ref 19–32)
Calcium: 9.4 mg/dL (ref 8.4–10.5)
Eosinophils Absolute: 0.1 10*3/uL (ref 0.0–0.7)
Eosinophils Relative: 1 % (ref 0–5)
Glucose, Bld: 105 mg/dL — ABNORMAL HIGH (ref 70–99)
HCT: 41.2 % (ref 36.0–46.0)
Lymphocytes Relative: 26 % (ref 12–46)
Lymphs Abs: 2.4 10*3/uL (ref 0.7–3.3)
Neutrophils Relative %: 67 % (ref 43–77)
Platelets: 481 10*3/uL — ABNORMAL HIGH (ref 150–400)
Potassium: 4.2 meq/L (ref 3.5–5.3)
RDW: 19.6 % — ABNORMAL HIGH (ref 11.5–14.0)
Sodium: 140 meq/L (ref 135–145)
Uric Acid, Serum: 4.5 mg/dL (ref 2.4–7.0)
WBC: 9.1 10*3/uL (ref 4.0–10.5)

## 2007-01-13 IMAGING — CR DG ELBOW 2V*R*
3 series · 3 of 3 positions shown · non-contrast
Comparison: None.

CLINICAL DATA: Left elbow pain for one day, no specific injury.  Decreased extension of right elbow.
 RIGHT ELBOW ? TWO VIEWS:

[view not recorded (1 of 3)]
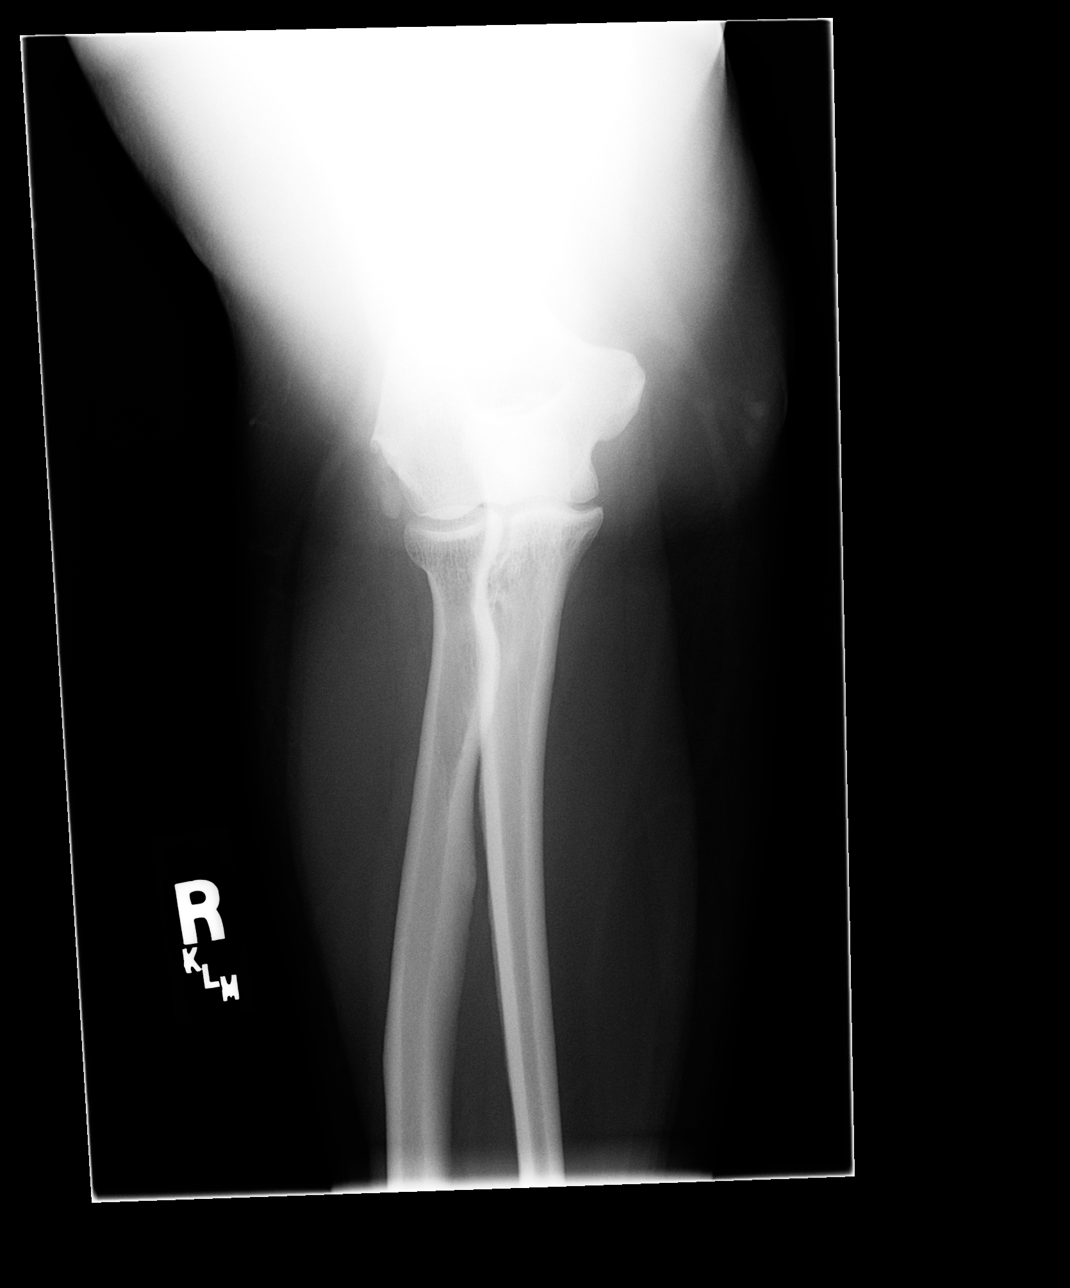

[view not recorded (2 of 3)]
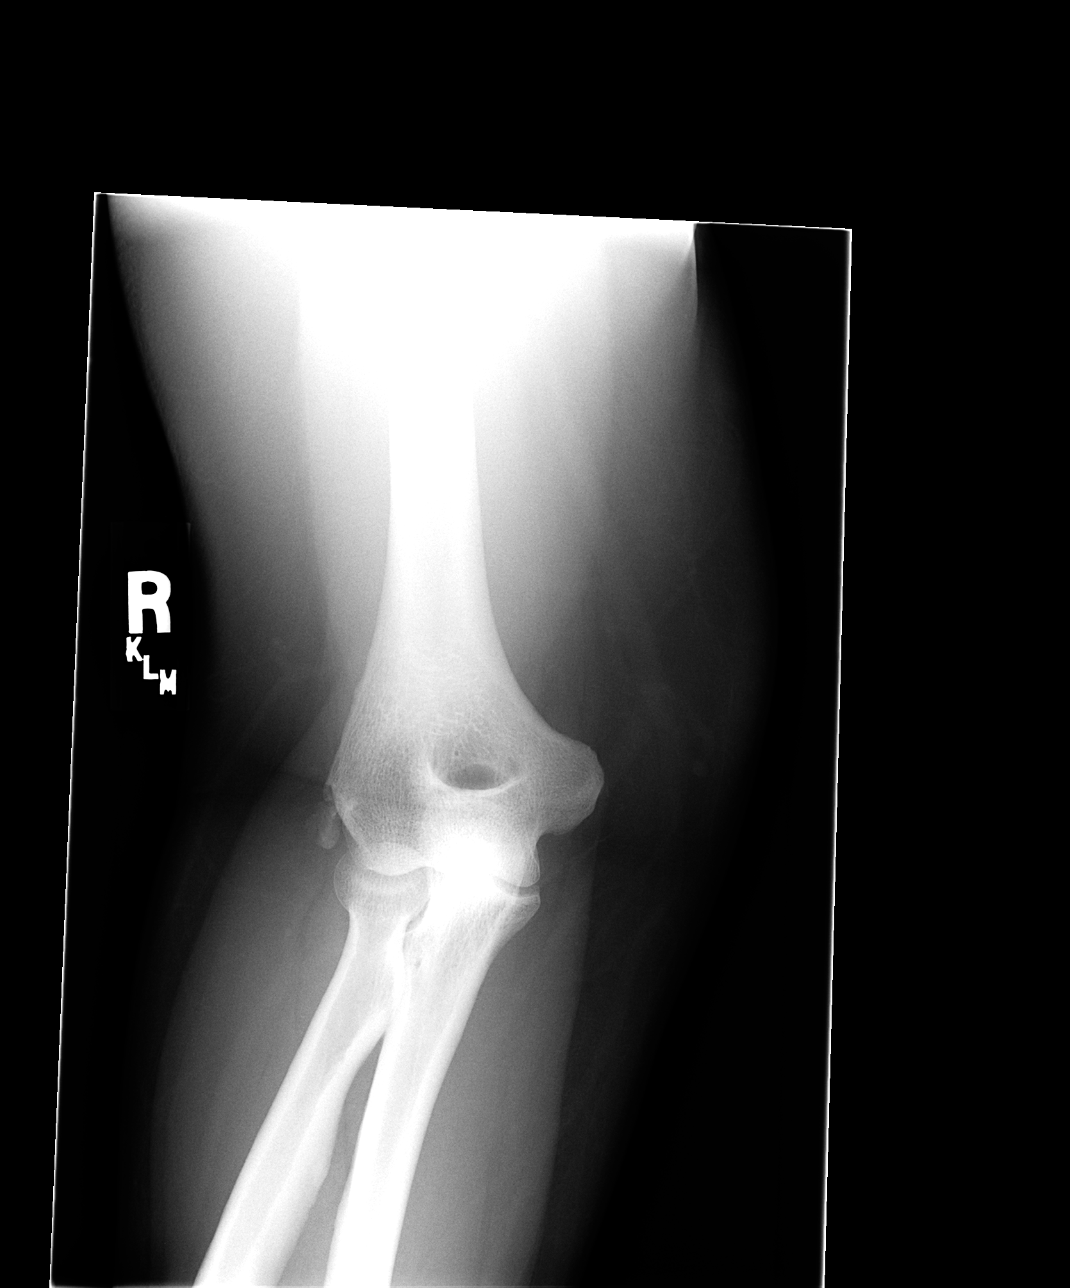

[view not recorded (3 of 3)]
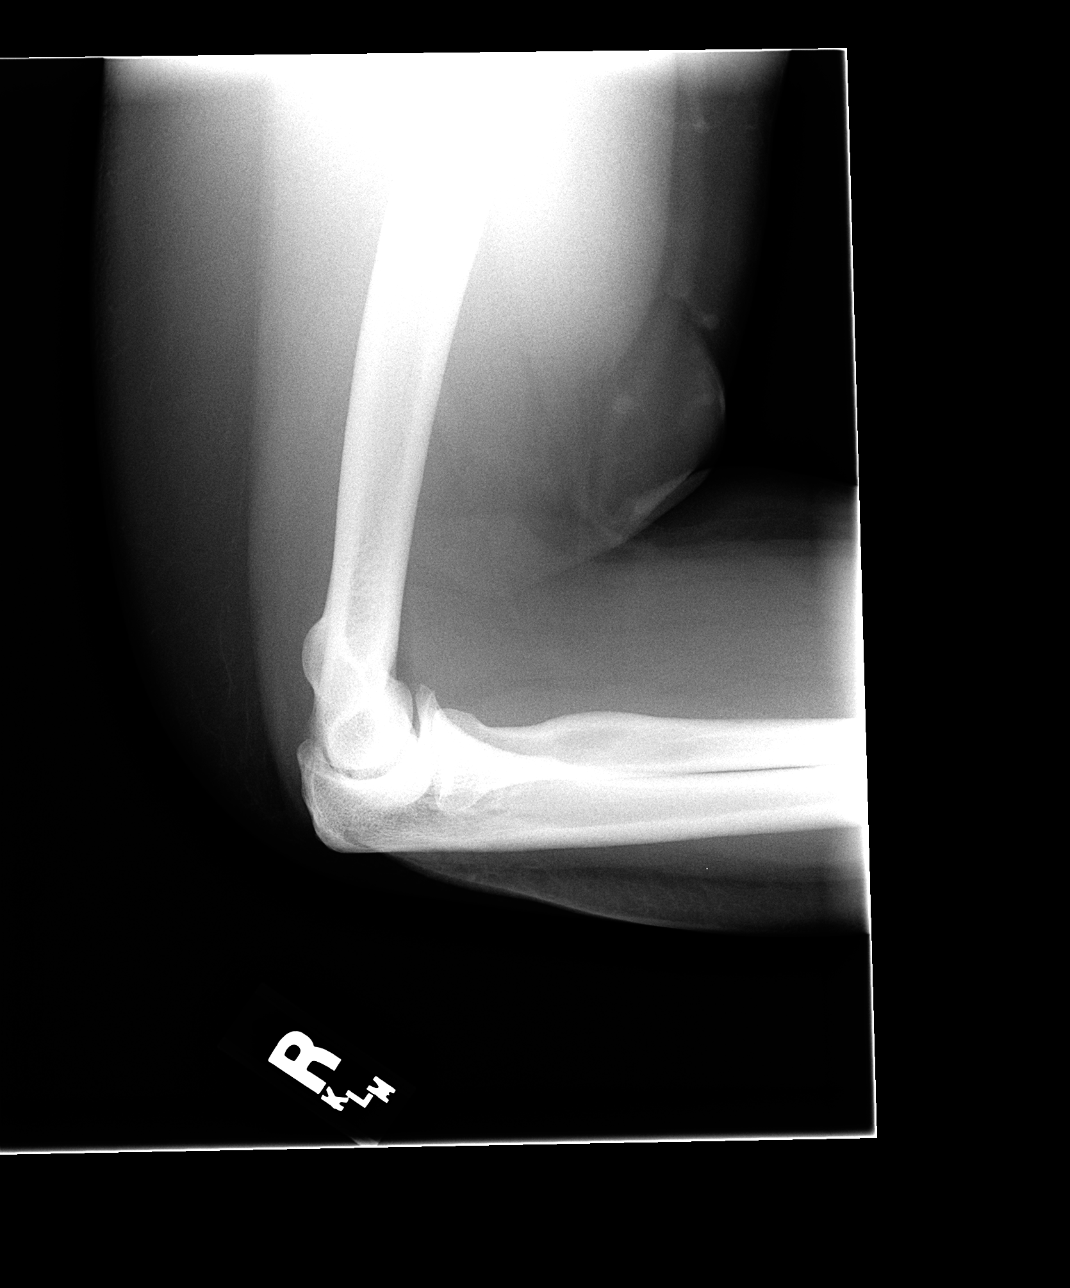

[3 of 3 positions shown; findings below may reference images not displayed]

FINDINGS: At the lateral right elbow joint is focal 14 mm long by 8 mm wide calcification consistent with chronic calcific tendinosis or tendinitis.  No displacement of fat pads for right elbow joint effusion seen.  No other significant osseous nor articular abnormality is seen.
IMPRESSION: 1.  Lateral peripheral right elbow calcific tendinosis/tendinitis.
 2.  Otherwise negative.

## 2007-01-14 ENCOUNTER — Ambulatory Visit: Payer: Self-pay | Admitting: Family Medicine

## 2007-01-14 DIAGNOSIS — D259 Leiomyoma of uterus, unspecified: Secondary | ICD-10-CM

## 2007-01-19 ENCOUNTER — Encounter (INDEPENDENT_AMBULATORY_CARE_PROVIDER_SITE_OTHER): Payer: Self-pay | Admitting: *Deleted

## 2007-03-04 ENCOUNTER — Telehealth: Payer: Self-pay | Admitting: *Deleted

## 2007-04-08 ENCOUNTER — Encounter: Payer: Self-pay | Admitting: *Deleted

## 2007-05-01 ENCOUNTER — Telehealth: Payer: Self-pay | Admitting: *Deleted

## 2007-05-06 ENCOUNTER — Encounter: Payer: Self-pay | Admitting: *Deleted

## 2007-06-29 ENCOUNTER — Encounter: Payer: Self-pay | Admitting: *Deleted

## 2007-07-12 ENCOUNTER — Emergency Department (HOSPITAL_COMMUNITY): Admission: EM | Admit: 2007-07-12 | Discharge: 2007-07-13 | Payer: Self-pay | Admitting: Emergency Medicine

## 2007-07-13 ENCOUNTER — Other Ambulatory Visit: Payer: Self-pay | Admitting: Emergency Medicine

## 2007-07-13 IMAGING — CT CT ABDOMEN W/O CM
3 of 4 series · 14 of 32 positions shown, 19 images · non-contrast
Comparison: Comparing report from [DATE].

CLINICAL DATA: Left back pain
ABDOMEN CT WITHOUT CONTRAST - [DATE]:
TECHNIQUE: Multidetector CT imaging of the abdomen was performed following the standard protocol without IV contrast.
TECHNIQUE: Multidetector CT imaging of the pelvis was performed following the standard protocol without IV contrast.

[Series 2: greater than (id) · axial · 0.98mm/px · z∈[-426,-120]mm · 4 of 103 slices shown, 9 images]
[im 21/103  soft-tissue]
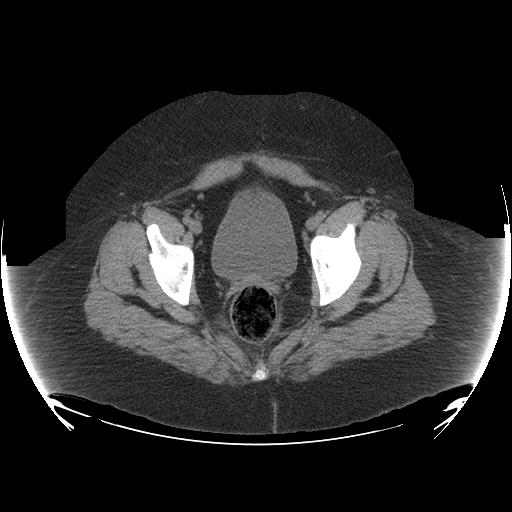
[im 21/103  lung]
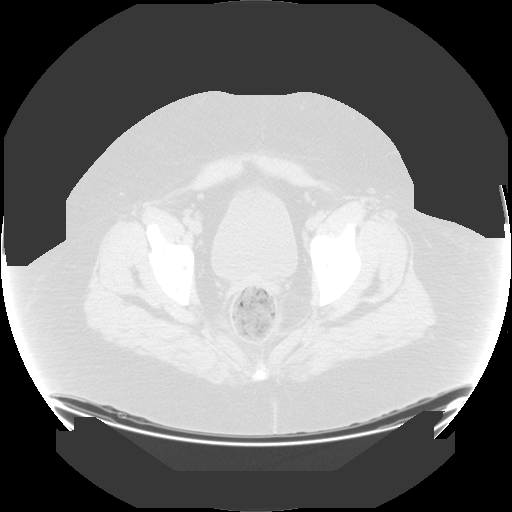
[im 21/103  bone]
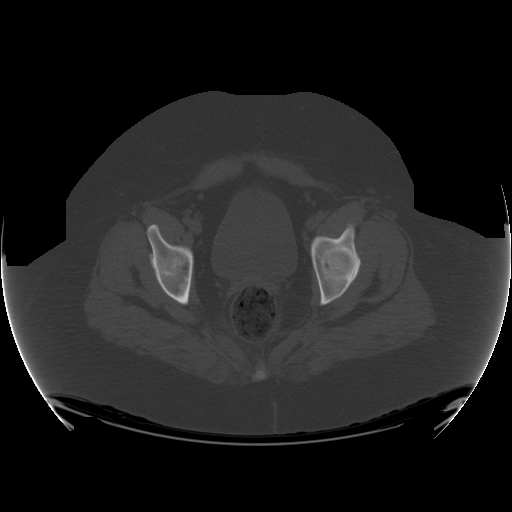
[im 41/103  soft-tissue]
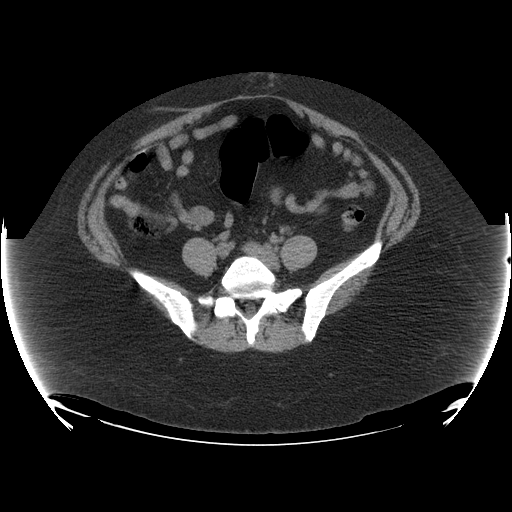
[im 41/103  lung]
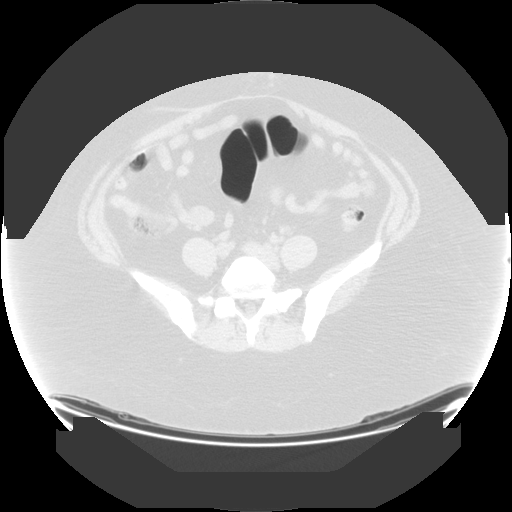
[im 62/103  soft-tissue]
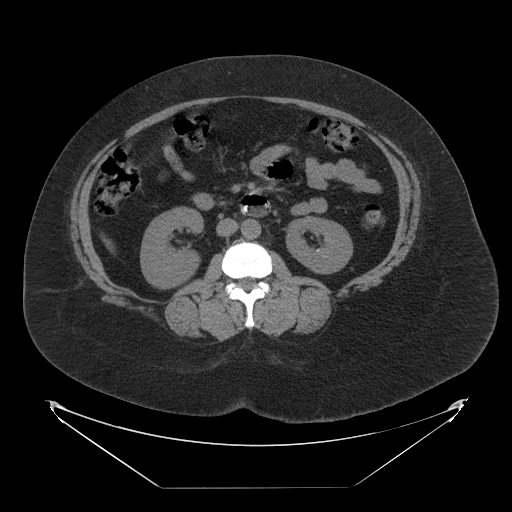
[im 62/103  lung]
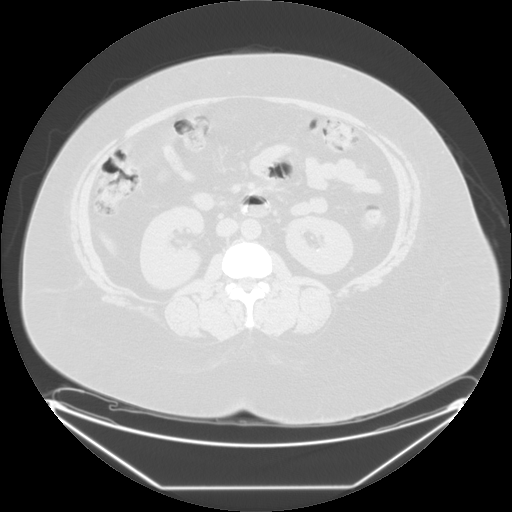
[im 82/103  soft-tissue]
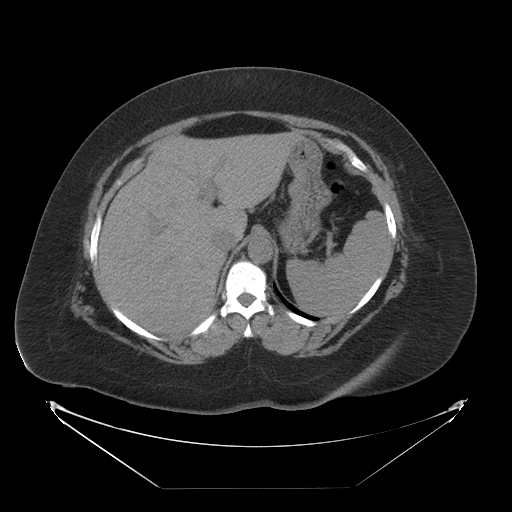
[im 82/103  lung]
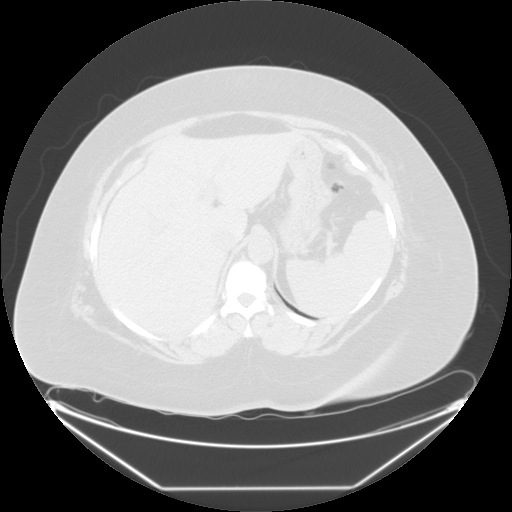

[Series 400: reformatted · sagittal · 1.02mm/px · 8 of 239 slices shown (1 of 2)]
[im 20/239  soft-tissue]
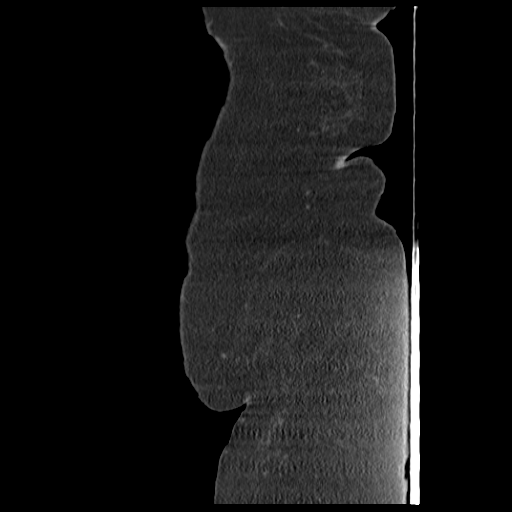
[im 60/239  soft-tissue]
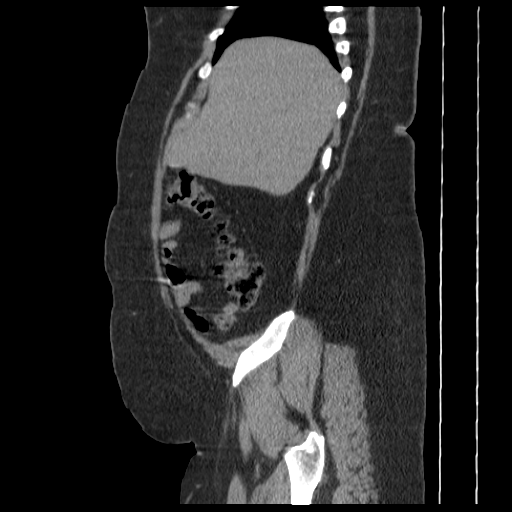
[im 80/239  soft-tissue]
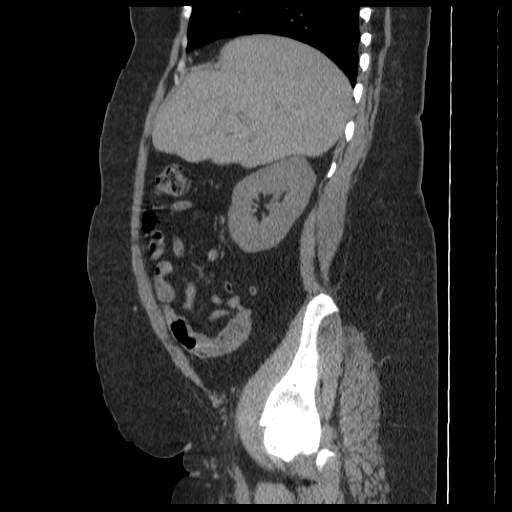
[im 100/239  soft-tissue]
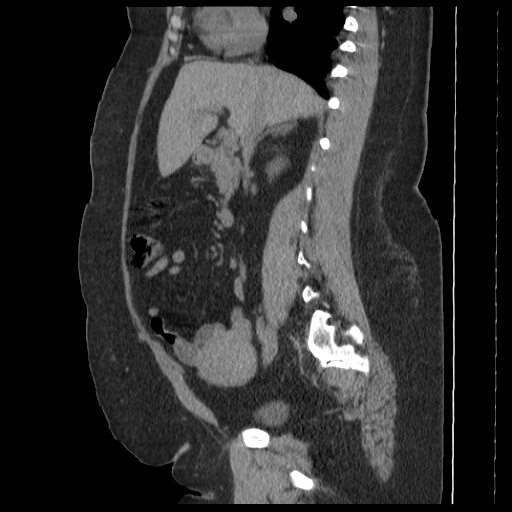
[im 139/239  soft-tissue]
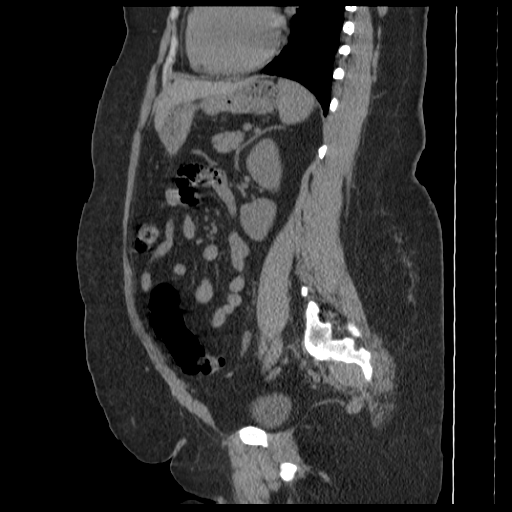
[im 159/239  soft-tissue]
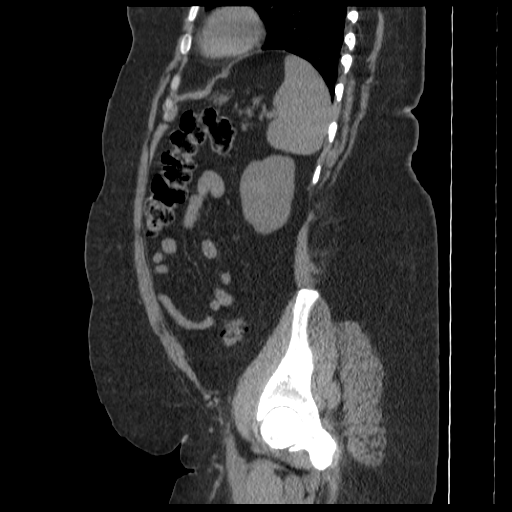
[im 179/239  soft-tissue]
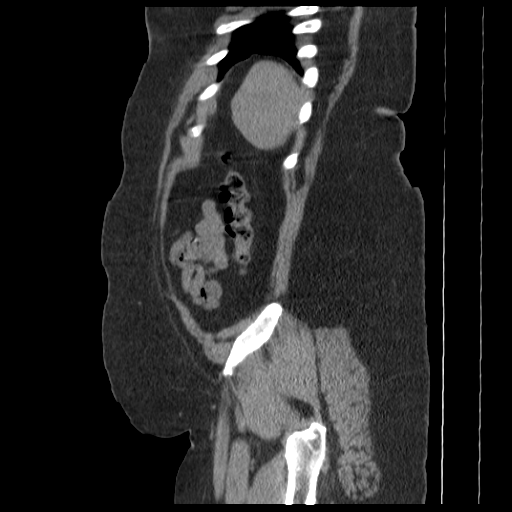
[im 219/239  soft-tissue]
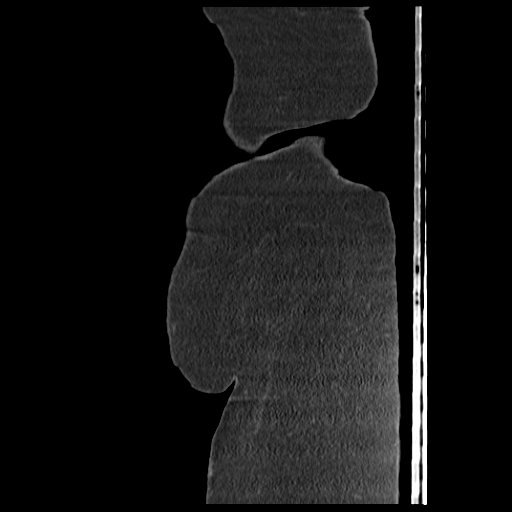

[Series 401: reformatted · coronal · 1.02mm/px · 2 of 203 slices shown (2 of 2)]
[im 21/203  soft-tissue]
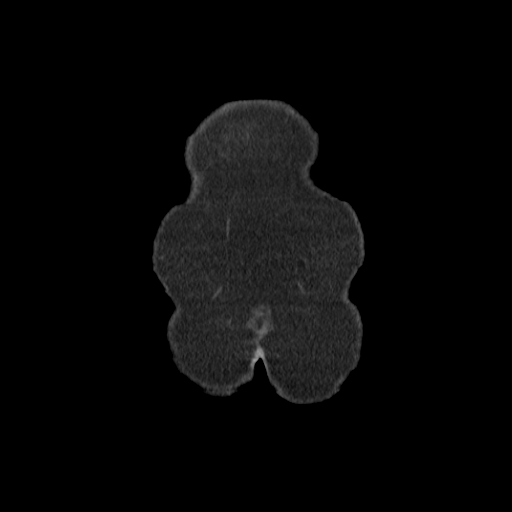
[im 41/203  soft-tissue]
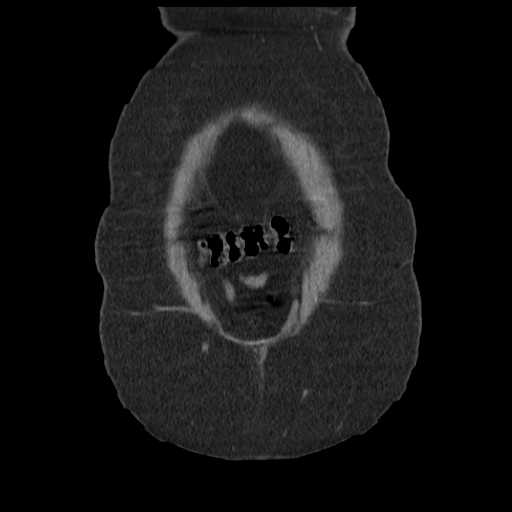

[14 of 32 positions shown; findings below may reference images not displayed]

FINDINGS: Dependent subsegmental atelectasis is present in the right lower lobe.  Cholecystectomy clips noted.  The noncontrast CT appearance of the liver, spleen, pancreas, and adrenal glands is normal.  A right kidney lower pole nonobstructive 2 mm calculus is present.  A similar finding is present on the left side.  No hydronephrosis or hydroureter noted.  No dilated bowel is noted.
IMPRESSION: Bilateral nephrolithiasis without hydronephrosis or hydroureter. 
PELVIS CT WITHOUT CONTRAST:
FINDINGS: An umbilical hernia contains adipose tissue.  There is lobularity of the uterus suggesting uterine fibroids.  The urinary bladder appears unremarkable.  No distal ureteral calculus or distal hydroureter is noted.  No pathologic pelvic adenopathy is identified.  Sclerosis along the iliac side of the sacroiliac joints suggests osteitis condensans ilii.
IMPRESSION: 1.  Suspected uterine fibroids.  
2.  Umbilical hernia contains adipose tissue.
3.  Osteitis condensans ilii.  
4.  The appendix and distal ureters appear normal.

## 2007-07-28 ENCOUNTER — Encounter: Payer: Self-pay | Admitting: Family Medicine

## 2007-07-28 ENCOUNTER — Encounter: Payer: Self-pay | Admitting: *Deleted

## 2007-08-15 ENCOUNTER — Emergency Department (HOSPITAL_COMMUNITY): Admission: EM | Admit: 2007-08-15 | Discharge: 2007-08-15 | Payer: Self-pay | Admitting: Family Medicine

## 2007-09-01 ENCOUNTER — Telehealth: Payer: Self-pay | Admitting: *Deleted

## 2007-09-22 ENCOUNTER — Ambulatory Visit: Payer: Self-pay | Admitting: Family Medicine

## 2007-09-22 DIAGNOSIS — N950 Postmenopausal bleeding: Secondary | ICD-10-CM | POA: Insufficient documentation

## 2007-09-22 DIAGNOSIS — E1065 Type 1 diabetes mellitus with hyperglycemia: Secondary | ICD-10-CM

## 2007-09-22 DIAGNOSIS — E108 Type 1 diabetes mellitus with unspecified complications: Secondary | ICD-10-CM

## 2007-09-22 LAB — CONVERTED CEMR LAB
ALT: 10 units/L (ref 0–35)
AST: 12 units/L (ref 0–37)
Albumin: 4.1 g/dL (ref 3.5–5.2)
Alkaline Phosphatase: 112 units/L (ref 39–117)
Potassium: 3.9 meq/L (ref 3.5–5.3)
Sodium: 142 meq/L (ref 135–145)
Total Bilirubin: 0.3 mg/dL (ref 0.3–1.2)
Total Protein: 7.6 g/dL (ref 6.0–8.3)

## 2007-10-02 ENCOUNTER — Telehealth: Payer: Self-pay | Admitting: *Deleted

## 2008-09-26 ENCOUNTER — Inpatient Hospital Stay (HOSPITAL_COMMUNITY): Admission: EM | Admit: 2008-09-26 | Discharge: 2008-09-29 | Payer: Self-pay | Admitting: Emergency Medicine

## 2008-09-26 IMAGING — CR DG CHEST 2V
2 series · 2 of 2 positions shown · non-contrast
Comparison: [DATE]

CLINICAL DATA: Chest pain

CHEST - 2 VIEW

[w chest pa]
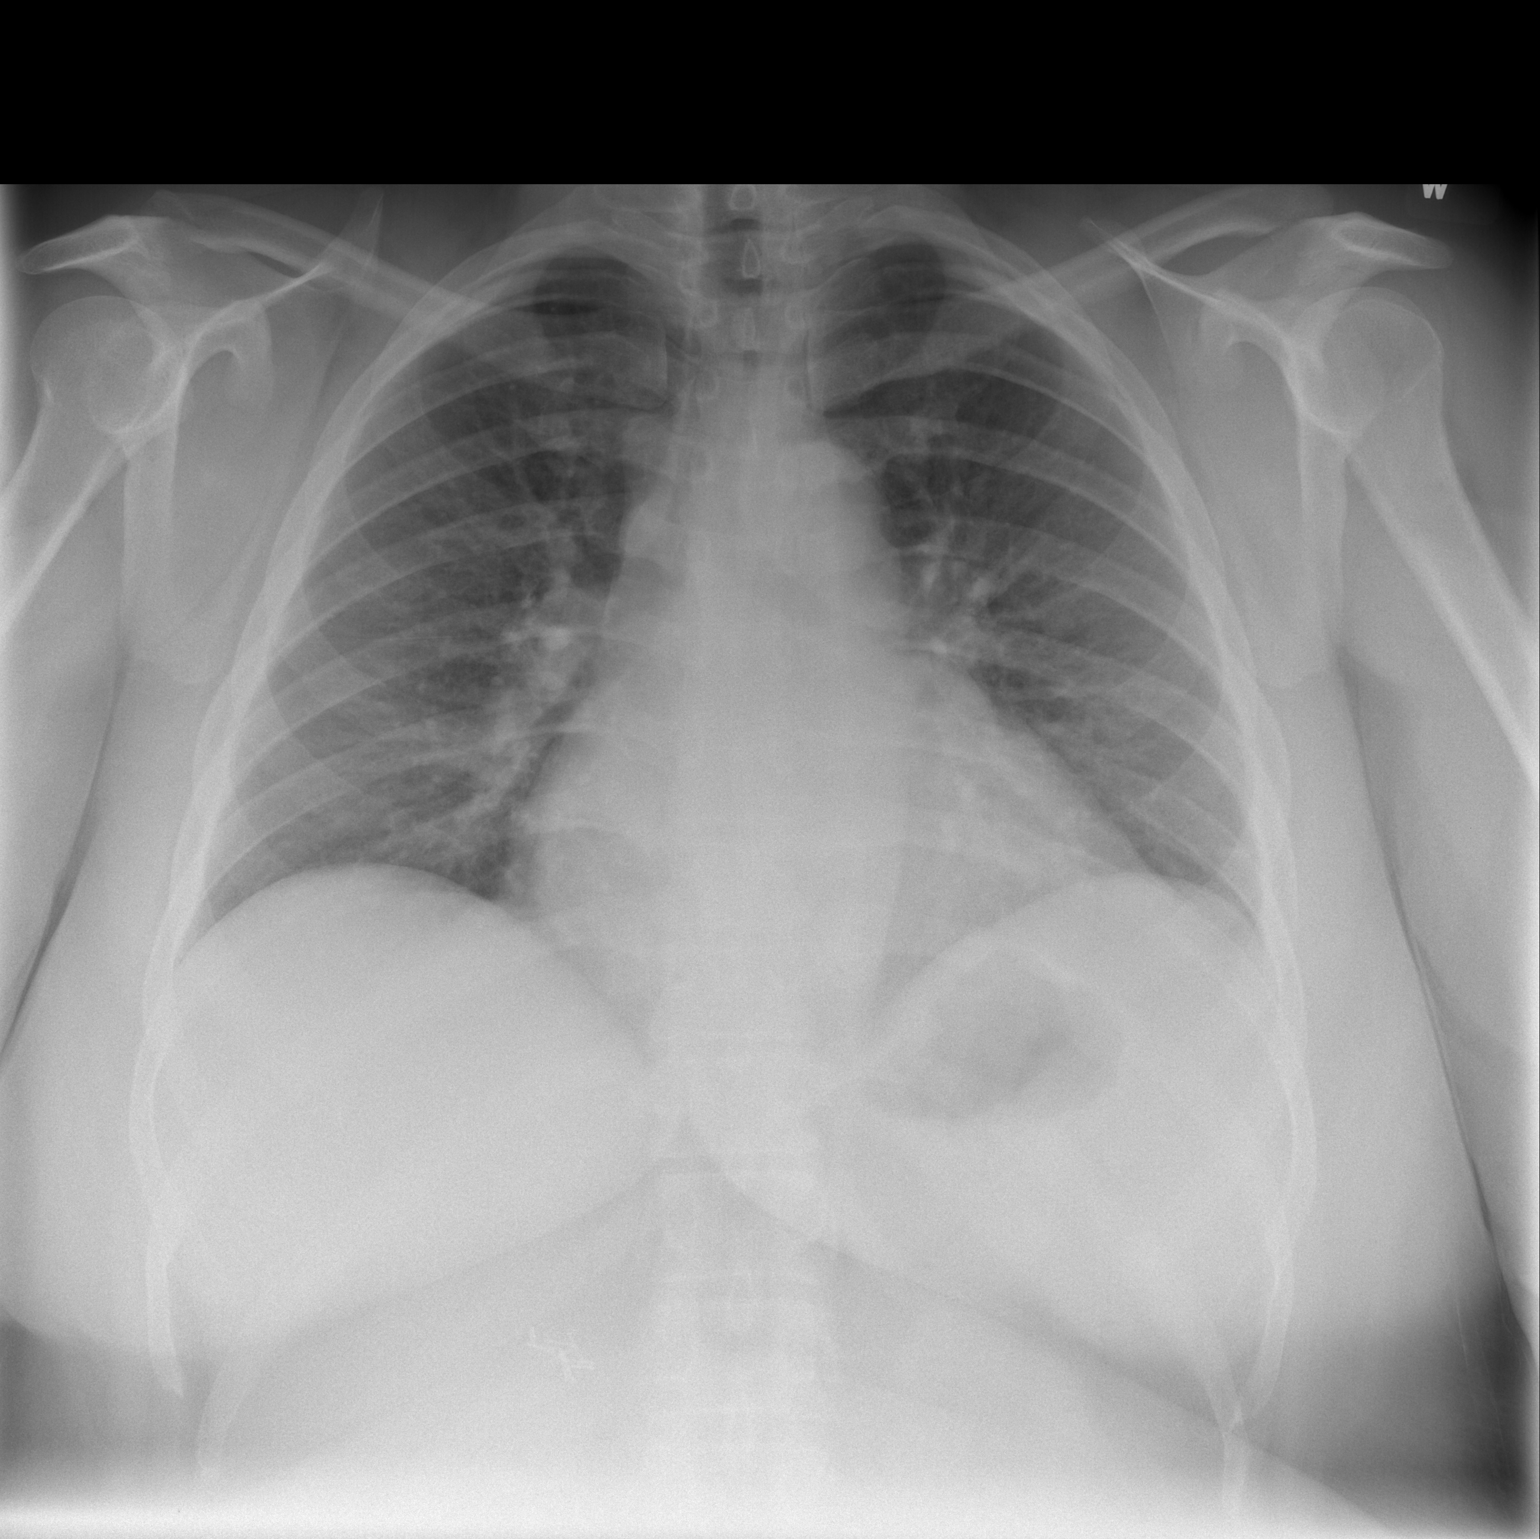

[w chest lat]
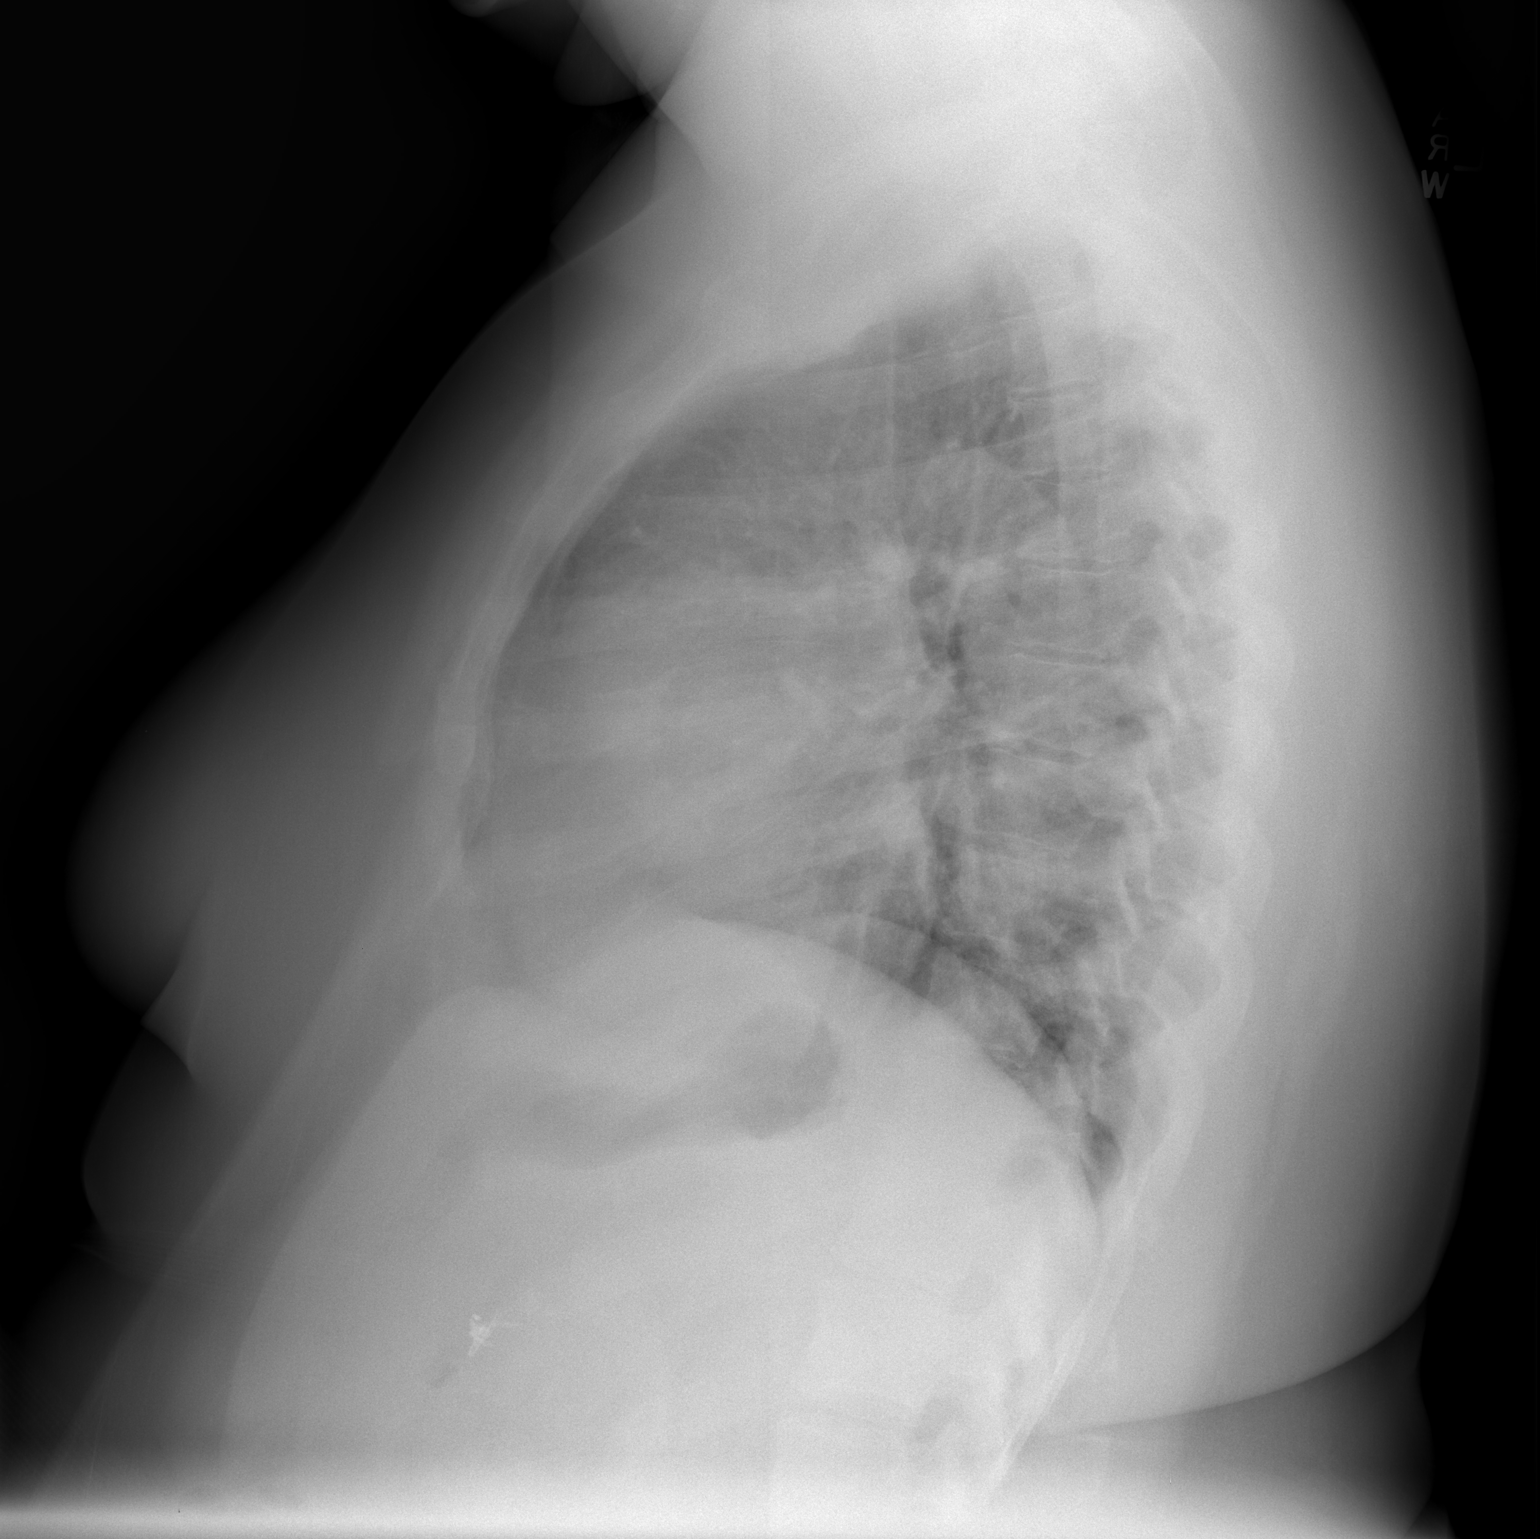

[2 of 2 positions shown; findings below may reference images not displayed]

FINDINGS: There is cardiac enlargement.

No pleural effusion or pulmonary edema noted.

There are no airspace densities identified.
IMPRESSION: 1.  No active cardiopulmonary disease.

## 2008-09-27 ENCOUNTER — Encounter: Payer: Self-pay | Admitting: Internal Medicine

## 2008-09-27 ENCOUNTER — Encounter (INDEPENDENT_AMBULATORY_CARE_PROVIDER_SITE_OTHER): Payer: Self-pay | Admitting: Internal Medicine

## 2008-11-13 ENCOUNTER — Emergency Department (HOSPITAL_COMMUNITY): Admission: EM | Admit: 2008-11-13 | Discharge: 2008-11-13 | Payer: Self-pay | Admitting: Emergency Medicine

## 2008-12-19 ENCOUNTER — Telehealth: Payer: Self-pay | Admitting: Family Medicine

## 2008-12-20 ENCOUNTER — Ambulatory Visit: Payer: Self-pay | Admitting: Family Medicine

## 2008-12-20 LAB — CONVERTED CEMR LAB: Hgb A1c MFr Bld: 6.8 %

## 2009-01-06 ENCOUNTER — Ambulatory Visit: Payer: Self-pay | Admitting: Family Medicine

## 2009-01-24 ENCOUNTER — Encounter: Payer: Self-pay | Admitting: Family Medicine

## 2009-05-25 ENCOUNTER — Telehealth: Payer: Self-pay | Admitting: Family Medicine

## 2009-10-25 ENCOUNTER — Encounter: Payer: Self-pay | Admitting: *Deleted

## 2009-10-30 ENCOUNTER — Ambulatory Visit: Payer: Self-pay | Admitting: Family Medicine

## 2010-03-19 ENCOUNTER — Encounter: Payer: Self-pay | Admitting: Family Medicine

## 2010-06-05 NOTE — Assessment & Plan Note (Signed)
 Summary: out of meds/[facility]   Vital Signs:  Patient profile:   50 year old female Height:      69 inches Weight:      297.4 pounds BMI:     44.08 Temp:     98.3 degrees F oral Pulse rate:   80 / minute BP sitting:   190 / 102  (left arm)  Vitals Entered By: Letitia Reusing (December 20, 2008 8:46 AM) CC: med refill Is Patient Diabetic? Yes  Pain Assessment Patient in pain? no        Primary Care Provider:  GLENYS BIRK MD  CC:  med refill.  History of Present Illness: 1F with HTN, HLD, DM2 comes in after not taking BP meds x >1 week.  Comes in for refill of meds.  BP very elevated today but no CP, SOB, LE swelling, visual changes, confusion, neck pain, numbness, tingling, weakness.  Was recently hospitalized and BP was well controlled on Coreg , HCTZ, Norvasc , and Clonidine .  She is no longer taking Atenolol .   Habits & Providers  Alcohol-Tobacco-Diet     Tobacco Status: never  Allergies: 1)  ! 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 children  Social History: Smoking Status:  never  Review of Systems       See HPI  Physical Exam  General:  Well-developed,well-nourished,in no acute distress; alert,appropriate and cooperative throughout examination Head:  Normocephalic and atraumatic without obvious abnormalities.  Eyes:  No corneal or conjunctival inflammation noted. EOMI. Perrla.  Ears:  External ear exam shows no significant lesions or deformities.  Nose:  External nasal examination shows no deformity or inflammation. Lungs:  Normal respiratory effort, chest expands  symmetrically. Lungs are clear to auscultation, no crackles or wheezes. Heart:  Normal rate and regular rhythm. S1 and S2 normal without gallop, murmur, click, rub or other extra sounds. Abdomen:  Bowel sounds positive,abdomen soft and non-tender without masses, organomegaly or hernias noted. Extremities:  No clubbing, cyanosis, edema, or deformity noted with normal full range of motion of all joints.   Neurologic:  Grossly non-focal.   Impression & Recommendations:  Problem # 1:  HYPERTENSION, BENIGN SYSTEMIC (ICD-401.1) Assessment Deteriorated Severe hypertension 2/2 noncompliance with medications.  No signs hypertensive emergency.  Will refill BP meds with 1 month supply.  This should give her time to get an appt with Dr. Birk to fu BP and other chronic diseases.    The following medications were removed from the medication list:    Atenolol  50 Mg Tabs (Atenolol ) .SABRA... 1 by mouth daily Her updated medication list for this problem includes:    Catapres  0.3 Mg Tabs (Clonidine  hcl) .SABRA... 1 by mouth two times a day    Hydrochlorothiazide  25 Mg Tabs (Hydrochlorothiazide ) .SABRA... Take one tablet by mouth every morning    Norvasc  5 Mg Tabs (Amlodipine  besylate) .SABRA... 1 by mouth daily    Coreg  12.5 Mg Tabs (Carvedilol ) ..... One tab by mouth two times a day  Orders: [facility]- Est Level  3 (00786)  Problem # 2:  DIABETES MELLITUS, NONINSULIN DEPENDENT (NIDDM) (ICD-250.00) Assessment: Deteriorated A1c checked today, 6.8.  Slightly worse than last.  May  need to consider increasing metformin  if still >6.5% in 3 months.  Her updated medication list for this problem includes:    Metformin  Hcl 500 Mg Tabs (Metformin  hcl) .SABRA... Take 1 tablet by mouth twice a day  Orders: A1C-[facility] (16963) [facility]- Est Level  3 (00786)  Complete Medication List: 1)  Catapres  0.3 Mg Tabs (Clonidine  hcl) .SABRA.. 1 by mouth two times a day 2)  Metformin  Hcl 500 Mg Tabs (Metformin  hcl) .... Take 1 tablet by mouth twice a day 3)   Naproxen  500 Mg Tabs (Naproxen ) .SABRA.. 1 two times a day 4)  Omeprazole  20 Mg Cpdr (Omeprazole ) .... Take 1 capsule by mouth twice a day 5)  Hydrochlorothiazide  25 Mg Tabs (Hydrochlorothiazide ) .... Take one tablet by mouth every morning 6)  Promethazine  Hcl 25 Mg Tabs (Promethazine  hcl) .... Take 1/2 - 1 tablet 4 times a day 7)  Norvasc  5 Mg Tabs (Amlodipine  besylate) .SABRA.. 1 by mouth daily 8)  Coreg  12.5 Mg Tabs (Carvedilol ) .... One tab by mouth two times a day  Patient Instructions: 1)  Good to meet you today, 2)  We gave you a month supply of your Catapres , Coreg , HCTZ, and Novasc.  This should give you time to make an appt with Dr. Fredirick to follow up your high blood pressures. 3)  Call back if you have chest pain, shortness of breath. 4)  -Dr. ONEIDA. Prescriptions: HYDROCHLOROTHIAZIDE  25 MG  TABS (HYDROCHLOROTHIAZIDE ) Take one tablet by mouth every morning  #30 x 0   Entered and Authorized by:   Debby Petties MD   Signed by:   Debby Petties MD on 12/20/2008   Method used:   Electronically to        [facility] [address] (retail)       [phone]       [place], [place]  [address]       Ph: [phone]       Fax: [phone]   RxID:   [id number] NORVASC  5 MG  TABS (AMLODIPINE  BESYLATE) 1 by mouth daily  #30 x 0   Entered and Authorized by:   Debby Petties MD   Signed by:   Debby Petties MD on 12/20/2008   Method used:   Electronically to        [facility] [address] (retail)       [phone]       [place], [place]  [address]       Ph: [phone]       Fax: [phone]   RxID:   [id number] CATAPRES  0.3 MG  TABS (CLONIDINE  HCL) 1 by mouth two times a day  #60 x 0   Entered and Authorized by:   Debby Petties MD   Signed by:   Debby Petties MD on 12/20/2008   Method used:   Electronically to        [facility] [address] (retail)       [phone]       [place], [place]  [address]       Ph: [phone]       Fax: [phone]   RxID:   [id number] COREG  12.5 MG TABS (CARVEDILOL ) One tab by mouth two times a day  #60 x 0   Entered and Authorized by:   Debby Petties MD   Signed  by:   Debby Petties MD on 12/20/2008   Method used:   Electronically to        [facility] [address] (retail)       [phone]       [place], [place]  [address]       Ph: [phone]       Fax: [phone]   RxID:   [id number]   Laboratory Results   Blood Tests   Date/Time Received: December 20, 2008 8:52 AM  Date/Time Reported: December 20, 2008 9:03 AM   HGBA1C: 6.8%   (Normal Range: Non-Diabetic - 3-6%   Control Diabetic - 6-8%)  Comments: ...............test performed by......SABRABonnie A. Jordan, [place] (ASCP)

## 2010-06-05 NOTE — Progress Notes (Signed)
Summary: needs refill on clonidine  Phone Note Outgoing Call   Summary of Call: called patient to advise her make appointment with Dr. Kennon Rounds to follow up on BP. states she tried to come in for appointment but we were not able to see her because she doesn't have any insurance. she was given phone number of Clarice Pole and plans to call her back tomorrow to schedule appointment and hopefully will get the certification  and then she will schedule appointment. advised her that metformin has been  sent in. states she needs refill on Clonidine also .will forward message to Dr. Kennon Rounds.   pharmacy , [facility] Initial call taken by: Marcell Barlow RN,  May 25, 2009 3:31 PM    Prescriptions: CATAPRES 0.3 MG  TABS (CLONIDINE HCL) 1 by mouth two times a day  #60 x 3   Entered and Authorized by:   Darron Doom MD   Signed by:   Darron Doom MD on 05/25/2009   Method used:   Electronically to        [facility] (retail)       [phone]       [place], [place]  [address]       Ph: [phone]       Fax: [phone]   RxID:   [id number]

## 2010-06-05 NOTE — Miscellaneous (Signed)
  Clinical Lists Changes  Problems: Removed problem of ELBOW PAIN, RIGHT (ICD-719.42) Removed problem of FAMILY HISTORY DIABETES 1ST DEGREE RELATIVE (ICD-V18.0)

## 2010-06-05 NOTE — Miscellaneous (Signed)
Summary: re; refills  Clinical Lists      message left on voicemail to return call. Marcell Barlow RN  October 25, 2009 5:12 PM

## 2010-06-05 NOTE — Assessment & Plan Note (Signed)
Summary: follow up BP, needs reffils/ls   Vital Signs:  Patient profile:   50 year old female Height:      69 inches Weight:      284.5 pounds BMI:     42.17 Temp:     98.4 degrees F oral Pulse rate:   80 / minute BP sitting:   164 / 98  (left arm) Cuff size:   large  Vitals Entered By: Isabelle Course (October 30, 2009 1:27 PM)  Serial Vital Signs/Assessments:  Time      Position  BP       Pulse  Resp  Temp     By 2:02 PM             162/102                        Isabelle Course  Comments: 2:02 PM re checked manually By: Isabelle Course   CC: F/U HTN, DM, med refill Is Patient Diabetic? Yes Did you bring your meter with you today? No Pain Assessment Patient in pain? no        Primary Care Provider:  Darron Doom MD  CC:  F/U HTN, DM, and med refill.  History of Present Illness: 1) HTN: [id number] systolic last visit (99991111) same today even after recheck (see vitals). Exercises 1.5 - 2 hrs per day total (walking on treadmill twice a day). Monitors sodium intake, has increased fruit and vegetable intake. Denies chest pain, LE edema, dyspnea. Reports near 100% compliance with all meds   2) DM2: 6.8 A1C at last visit in 9/10. 6.7 today. Reports near 100% compliance with all meds. Has not been checking blood sugars (used to check at work but she lost her job in January 2011). Diet and exercise as above. Has not been to ophtho in 2 years. Denies polyuria, polydipsia, vision change, neuropathy. Note ALLERGY - (angioedema) w/ ACE-I)   3) Obesity: Weight down 10 lbs since last visit in 9/10. Exercise and dietary changes as above.  4) Tobacco use: Actively quitting. Down to 1 pack every 2 weeks. Smokes when anxious or stressed.     Habits & Providers  Alcohol-Tobacco-Diet     Tobacco Status: current     Tobacco Counseling: to quit use of tobacco products     Cigarette Packs/Day: <0.25  Medications Prior to Update: 1)  Catapres 0.3 Mg  Tabs (Clonidine Hcl) .Marland Kitchen.. 1 By Mouth Two Times  A Day 2)  Metformin Hcl 500 Mg Tabs (Metformin Hcl) .... Take 1 Tablet By Mouth Twice A Day 3)  Naproxen 500 Mg Tabs (Naproxen) .Marland Kitchen.. 1 Two Times A Day 4)  Omeprazole 20 Mg Cpdr (Omeprazole) .... Take 1 Capsule By Mouth Twice A Day 5)  Hydrochlorothiazide 25 Mg  Tabs (Hydrochlorothiazide) .... Take One Tablet By Mouth Every Morning 6)  Promethazine Hcl 25 Mg  Tabs (Promethazine Hcl) .... Take 1/2 - 1 Tablet 4 Times A Day 7)  Norvasc 10 Mg Tabs (Amlodipine Besylate) .Marland Kitchen.. 1 By Mouth Daily 8)  Coreg 12.5 Mg Tabs (Carvedilol) .... One Tab By Mouth Two Times A Day  Current Medications (verified): 1)  Catapres 0.3 Mg  Tabs (Clonidine Hcl) .Marland Kitchen.. 1 By Mouth Two Times A Day 2)  Metformin Hcl 500 Mg Tabs (Metformin Hcl) .... Take 1 Tablet By Mouth Twice A Day 3)  Naproxen 500 Mg Tabs (Naproxen) .Marland Kitchen.. 1 Two Times A Day 4)  Omeprazole 20 Mg Cpdr (Omeprazole) .... Take  1 Capsule By Mouth Twice A Day 5)  Hydrochlorothiazide 25 Mg  Tabs (Hydrochlorothiazide) .... Take One Tablet By Mouth Every Morning 6)  Promethazine Hcl 25 Mg  Tabs (Promethazine Hcl) .... Take 1/2 - 1 Tablet 4 Times A Day 7)  Norvasc 10 Mg Tabs (Amlodipine Besylate) .Marland Kitchen.. 1 By Mouth Daily 8)  Coreg 25 Mg Tabs (Carvedilol) .... One Tab By Mouth Two Times A Day  Allergies (verified): 1)  ! Lisinopril (Lisinopril)  Review of Systems       as per HPI o/w negative   Physical Exam  General:  obese, NAD, pleasant  Eyes:  no retinopathy  Neck:  no JVD or carotid bruits  Lungs:  normal respiratory effort and normal breath sounds.   Heart:  normal rate and regular rhythm.   Pulses:  2+ radials and pedals  Extremities:  trace bilateral pedal edema  Neurologic:  alert & oriented X3.    Diabetes Management Exam:    Foot Exam (with socks and/or shoes not present):       Sensory-Pinprick/Light touch:          Left medial foot (L-4): normal          Left dorsal foot (L-5): normal          Left lateral foot (S-1): normal          Right  medial foot (L-4): normal          Right dorsal foot (L-5): normal          Right lateral foot (S-1): normal       Sensory-Monofilament:          Left foot: normal          Right foot: normal       Inspection:          Left foot: normal          Right foot: normal       Nails:          Left foot: normal          Right foot: normal   Impression & Recommendations:  Problem # 1:  DIABETES MELLITUS, NONINSULIN DEPENDENT (NIDDM) (ICD-250.00) Assessment Unchanged  At goal. Continue metformin at current dose. Encouraged to continue dietary and lifestyle modifications as above. NO ACE-I OR ARB w/ ANGIOEDEMA! Follow up three months. Advised to get Reli-On meter from [facility] (cheapest w/o insurance and check CBGs once a day).  Her updated medication list for this problem includes:    Metformin Hcl 500 Mg Tabs (Metformin hcl) .Marland Kitchen... Take 1 tablet by mouth twice a day  Orders: A1C-[facility] [id number]) [facility]- Est  Level 4 [id number])  Labs Reviewed: Creat: 0.71 (09/22/2007)   Microalbumin: 1+ (09/22/2007) Reviewed HgBA1c results: 6.7 (10/30/2009)  6.8 (12/20/2008)  Problem # 2:  TOBACCO DEPENDENCE (ICD-305.1) Assessment: Unchanged  Actively quitting. Encouraged complete cessation. Follow in three months   Orders: [facility]- Est  Level 4 [id number])  Problem # 3:  OBESITY, NOS (ICD-278.00) Assessment: Unchanged  Continue exercising and diet. Encouraged by current level of activity.   Ht: 69 (10/30/2009)   Wt: 284.5 (10/30/2009)   BMI: 42.17 (10/30/2009)  Orders: [facility]- Est  Level 4 [id number])  Problem # 4:  HYPERTENSION, BENIGN SYSTEMIC (ICD-401.1) Assessment: Unchanged  Will increase Coreg from 12.5 mg two times a day to 25 mg two times a day. Follow up in three months. DASH diet. WOULD CHECK LIPID PANEL AT NEXT APPOINTMENT.   Her updated  medication list for this problem includes:    Catapres 0.3 Mg Tabs (Clonidine hcl) .Marland Kitchen... 1 by mouth two times a day    Hydrochlorothiazide 25 Mg Tabs  (Hydrochlorothiazide) .Marland Kitchen... Take one tablet by mouth every morning    Norvasc 10 Mg Tabs (Amlodipine besylate) .Marland Kitchen... 1 by mouth daily    Coreg 25 Mg Tabs (Carvedilol) ..... One tab by mouth two times a day  BP today: 164/98 Prior BP: 164/91 (01/06/2009)  Prior 10 Yr Risk Heart Disease: Not enough information (01/13/2007)  Labs Reviewed: K+: 3.9 (09/22/2007) Creat: : 0.71 (09/22/2007)     Orders: [facility]- Est  Level 4 [id number])  Complete Medication List: 1)  Catapres 0.3 Mg Tabs (Clonidine hcl) .Marland Kitchen.. 1 by mouth two times a day 2)  Metformin Hcl 500 Mg Tabs (Metformin hcl) .... Take 1 tablet by mouth twice a day 3)  Naproxen 500 Mg Tabs (Naproxen) .Marland Kitchen.. 1 two times a day 4)  Omeprazole 20 Mg Cpdr (Omeprazole) .... Take 1 capsule by mouth twice a day 5)  Hydrochlorothiazide 25 Mg Tabs (Hydrochlorothiazide) .... Take one tablet by mouth every morning 6)  Promethazine Hcl 25 Mg Tabs (Promethazine hcl) .... Take 1/2 - 1 tablet 4 times a day 7)  Norvasc 10 Mg Tabs (Amlodipine besylate) .Marland Kitchen.. 1 by mouth daily 8)  Coreg 25 Mg Tabs (Carvedilol) .... One tab by mouth two times a day  Patient Instructions: 1)  It was great to see you today!  2)  Follow up in three months with Dr. Kennon Rounds. 3)  Keep working on quitting smoking. 4)  Continue to exercise, to reduce salt intake and increase vegetable and fruit intake.  5)  I have increased the dose of your Coreg. 6)  Go to [facility] on [address] to pick up your medicines. 7)  Go to your eye doctor to have your eyes checked YEARLY since you have diabetes  Prescriptions: NORVASC 10 MG TABS (AMLODIPINE BESYLATE) 1 by mouth daily  #30 Each x 3   Entered and Authorized by:   Mariana Arn  MD   Signed by:   Mariana Arn  MD on 10/30/2009   Method used:   Electronically to        [facility].* (retail)       [phone]       [place], [place]  [address]       Ph: [phone]       Fax: [phone]   [id number] HYDROCHLOROTHIAZIDE 25 MG  TABS (HYDROCHLOROTHIAZIDE) Take one tablet by mouth every morning  #30 x 3   Entered and Authorized by:   Mariana Arn  MD   Signed by:   Mariana Arn  MD on 10/30/2009   Method used:   Electronically to        [facility].* (retail)       [phone]       [place], [place]  [address]       Ph: [phone]       Fax: [phone]   RxID:   [id number] METFORMIN HCL 500 MG TABS (METFORMIN HCL) Take 1 tablet by mouth twice a day  #60 x 3   Entered and Authorized by:   Mariana Arn  MD   Signed by:   Mariana Arn  MD on 10/30/2009   Method used:   Electronically to  [facility].* (retail)       [phone]       [place], [place]  [address]       Ph: [phone]       Fax: [phone]   [id number] CATAPRES 0.3 MG  TABS (CLONIDINE HCL) 1 by mouth two times a day  #60 x 3   Entered and Authorized by:   Mariana Arn  MD   Signed by:   Mariana Arn  MD on 10/30/2009   Method used:   Electronically to        [facility].* (retail)       [phone]       [place], [place]  [address]       Ph: [phone]       Fax: [phone]   RxID:   [id number] COREG 25 MG TABS (CARVEDILOL) one tab by mouth two times a day  #60 x 3   Entered and Authorized by:   Mariana Arn  MD   Signed by:   Mariana Arn  MD on 10/30/2009   Method used:   Electronically to        [facility].* (retail)       [phone]       [place], [place]  [address]       Ph: [phone]       Fax: [phone]   RxID:   [id number] COREG 12.5 MG TABS (CARVEDILOL) One tab by mouth two times a day  #60 x 3   Entered and Authorized by:   Mariana Arn  MD   Signed by:   Mariana Arn  MD on 10/30/2009   Method used:   Electronically to        [facility]. [id number]* (retail)        [address]       [place], [place]  [address]       Ph: [phone] or [phone]       Fax: [phone]   RxID:   [id number] NORVASC 10 MG TABS (AMLODIPINE BESYLATE) 1 by mouth daily  #30 Each x 3   Entered and Authorized by:   Mariana Arn  MD   Signed by:   Mariana Arn  MD on 10/30/2009   Method used:   Electronically to        [facility]. [id number]* (retail)       [address]       [place], [place]  [address]       Ph: [phone] or [phone]       Fax: [phone]   [id number] HYDROCHLOROTHIAZIDE 25 MG  TABS (HYDROCHLOROTHIAZIDE) Take one tablet by mouth every morning  #30 x 3   Entered and Authorized by:   Mariana Arn  MD   Signed by:   Mariana Arn  MD on 10/30/2009   Method used:   Electronically to        [facility]. [id number]* (retail)       [address]       [place],   [address]       Ph: [phone]  or [phone]       Fax: [phone]   [id number] METFORMIN HCL 500 MG TABS (METFORMIN HCL) Take 1 tablet by mouth twice a day  #60 x 3   Entered and Authorized by:   Mariana Arn  MD   Signed by:   Mariana Arn  MD on 10/30/2009   Method used:   Electronically to        [facility]. [id number]* (retail)       [address].       [place], [place]  [address]       Ph: [phone] or [phone]       Fax: [phone]   [id number] CATAPRES 0.3 MG  TABS (CLONIDINE HCL) 1 by mouth two times a day  #60 x 3   Entered and Authorized by:   Mariana Arn  MD   Signed by:   Mariana Arn  MD on 10/30/2009   Method used:   Electronically to        [facility]. [id number]* (retail)       [address].       [place], [place]  [address]       Ph: [phone] or [phone]       Fax: [phone]   RxID:   [id number]   Laboratory Results   Blood Tests   Date/Time Received: October 30, 2009 1:33 PM  Date/Time  Reported: October 30, 2009 2:10 PM   HGBA1C: 6.7%   (Normal Range: Non-Diabetic - 3-6%   Control Diabetic - 6-8%)  Comments: ...........test performed by...........Marland KitchenHedy Camara, CMA       Prevention & Chronic Care Immunizations   Influenza vaccine: Not documented    Tetanus booster: Not documented    Pneumococcal vaccine: Not documented  Colorectal Screening   Hemoccult: Not documented    Colonoscopy: Not documented  Other Screening   Pap smear: Done.  (09/03/2005)    Mammogram: Not documented   Smoking status: current  (10/30/2009)  Diabetes Mellitus   HgbA1C: 6.7  (10/30/2009)   HgbA1C action/deferral: Ordered  (10/30/2009)    Eye exam: Not documented    Foot exam: yes  (10/30/2009)   Foot exam action/deferral: Do today   High risk foot: Not documented   Foot care education: Not documented    Urine microalbumin/creatinine ratio: Not documented    Diabetes flowsheet reviewed?: Yes   Progress toward A1C goal: At goal  Lipids   Total Cholesterol: Not documented   LDL: Not documented   LDL Direct: Not documented   HDL: Not documented   Triglycerides: Not documented   Lipid panel due: 01/30/2010  Hypertension   Last Blood Pressure: 164 / 98  (10/30/2009)   Serum creatinine: 0.71  (09/22/2007)   Serum potassium 3.9  (09/22/2007)    Hypertension flowsheet reviewed?: Yes   Progress toward BP goal: Unchanged  Self-Management Support :   Personal Goals (by the next clinic visit) :     Personal A1C goal: 7  (10/30/2009)     Personal blood pressure goal: 130/80  (10/30/2009)   Patient will work on the following items until the next clinic visit to reach self-care goals:     Medications and monitoring: take my medicines every day, check my blood sugar, check my blood pressure, bring all of my medications to every visit, weigh myself weekly, examine my feet  every day  (10/30/2009)     Eating: drink diet soda or water instead of juice or soda, eat more  vegetables, use fresh or frozen vegetables, eat foods that are low in salt, eat baked foods instead of fried foods, eat fruit for snacks and desserts  (10/30/2009)    Diabetes self-management support: Not documented    Diabetes self-management support not done because: Good outcomes  (10/30/2009)    Hypertension self-management support: BP self-monitoring log, Written self-care plan  (10/30/2009)   Hypertension self-care plan printed.

## 2010-07-06 ENCOUNTER — Other Ambulatory Visit: Payer: Self-pay | Admitting: Family Medicine

## 2010-07-06 NOTE — Telephone Encounter (Signed)
Please review and refill

## 2010-08-12 LAB — CBC
HCT: 43.5 % (ref 36.0–46.0)
Hemoglobin: 13.9 g/dL (ref 12.0–15.0)
MCHC: 32 g/dL (ref 30.0–36.0)
MCV: 82.7 fL (ref 78.0–100.0)
Platelets: 315 10*3/uL (ref 150–400)
RDW: 16.4 % — ABNORMAL HIGH (ref 11.5–15.5)

## 2010-08-12 LAB — BASIC METABOLIC PANEL
BUN: 3 mg/dL — ABNORMAL LOW (ref 6–23)
CO2: 28 mEq/L (ref 19–32)
Chloride: 106 mEq/L (ref 96–112)
GFR calc non Af Amer: >60 mL/min (ref >60)
Glucose, Bld: 113 mg/dL — ABNORMAL HIGH (ref 70–99)
Potassium: 3.3 mEq/L — ABNORMAL LOW (ref 3.5–5.1)
Sodium: 144 mEq/L (ref 135–145)

## 2010-08-12 LAB — DIFFERENTIAL
Basophils Absolute: 0 10*3/uL (ref 0.0–0.1)
Eosinophils Absolute: 0.3 10*3/uL (ref 0.0–0.7)
Eosinophils Relative: 3 % (ref 0–5)
Lymphocytes Relative: 20 % (ref 12–46)
Monocytes Absolute: 0.5 10*3/uL (ref 0.1–1.0)

## 2010-08-12 LAB — URINE MICROSCOPIC-ADD ON

## 2010-08-12 LAB — URINALYSIS, ROUTINE W REFLEX MICROSCOPIC
Bilirubin Urine: NEGATIVE
Ketones, ur: NEGATIVE mg/dL
Nitrite: POSITIVE — AB
Protein, ur: 30 mg/dL — AB
pH: 7 (ref 5.0–8.0)

## 2010-08-14 LAB — LIPID PANEL
Cholesterol: 208 mg/dL — ABNORMAL HIGH (ref 0–200)
LDL Cholesterol: 154 mg/dL — ABNORMAL HIGH (ref 0–99)
Total CHOL/HDL Ratio: 6.7 RATIO
VLDL: 23 mg/dL (ref 0–40)

## 2010-08-14 LAB — CBC
HCT: 40.8 % (ref 36.0–46.0)
HCT: 40.9 % (ref 36.0–46.0)
Hemoglobin: 13.1 g/dL (ref 12.0–15.0)
Hemoglobin: 13.4 g/dL (ref 12.0–15.0)
Hemoglobin: 15.1 g/dL — ABNORMAL HIGH (ref 12.0–15.0)
MCHC: 32.1 g/dL (ref 30.0–36.0)
MCHC: 32.9 g/dL (ref 30.0–36.0)
MCV: 80.3 fL (ref 78.0–100.0)
Platelets: 279 10*3/uL (ref 150–400)
RBC: 5.08 MIL/uL (ref 3.87–5.11)
RBC: 5.75 MIL/uL — ABNORMAL HIGH (ref 3.87–5.11)
RDW: 15.8 % — ABNORMAL HIGH (ref 11.5–15.5)
RDW: 15.9 % — ABNORMAL HIGH (ref 11.5–15.5)

## 2010-08-14 LAB — BASIC METABOLIC PANEL
BUN: 6 mg/dL (ref 6–23)
CO2: 26 mEq/L (ref 19–32)
CO2: 28 mEq/L (ref 19–32)
CO2: 30 mEq/L (ref 19–32)
Calcium: 8.8 mg/dL (ref 8.4–10.5)
Calcium: 9.5 mg/dL (ref 8.4–10.5)
Chloride: 103 mEq/L (ref 96–112)
GFR calc Af Amer: >60 mL/min (ref >60)
GFR calc Af Amer: >60 mL/min (ref >60)
GFR calc non Af Amer: >60 mL/min (ref >60)
GFR calc non Af Amer: >60 mL/min (ref >60)
Glucose, Bld: 137 mg/dL — ABNORMAL HIGH (ref 70–99)
Glucose, Bld: 202 mg/dL — ABNORMAL HIGH (ref 70–99)
Potassium: 3.6 mEq/L (ref 3.5–5.1)
Potassium: 3.6 mEq/L (ref 3.5–5.1)
Sodium: 137 mEq/L (ref 135–145)
Sodium: 138 mEq/L (ref 135–145)
Sodium: 140 mEq/L (ref 135–145)

## 2010-08-14 LAB — GLUCOSE, CAPILLARY
Glucose-Capillary: 113 mg/dL — ABNORMAL HIGH (ref 70–99)
Glucose-Capillary: 119 mg/dL — ABNORMAL HIGH (ref 70–99)
Glucose-Capillary: 153 mg/dL — ABNORMAL HIGH (ref 70–99)
Glucose-Capillary: 209 mg/dL — ABNORMAL HIGH (ref 70–99)

## 2010-08-14 LAB — URINALYSIS, ROUTINE W REFLEX MICROSCOPIC
Bilirubin Urine: NEGATIVE
Ketones, ur: NEGATIVE mg/dL
Nitrite: NEGATIVE
Urobilinogen, UA: 1 mg/dL (ref 0.0–1.0)
pH: 7 (ref 5.0–8.0)

## 2010-08-14 LAB — URINE CULTURE: Colony Count: >=100000

## 2010-08-14 LAB — BRAIN NATRIURETIC PEPTIDE: Pro B Natriuretic peptide (BNP): 66.5 pg/mL (ref 0.0–100.0)

## 2010-08-14 LAB — POCT CARDIAC MARKERS
CKMB, poc: <1 ng/mL — ABNORMAL LOW (ref 1.0–8.0)
Myoglobin, poc: 65.7 ng/mL (ref 12–200)
Troponin i, poc: <0.05 ng/mL (ref 0.00–0.09)

## 2010-08-14 LAB — CARDIAC PANEL(CRET KIN+CKTOT+MB+TROPI)
CK, MB: 0.6 ng/mL (ref 0.3–4.0)
Relative Index: INVALID (ref 0.0–2.5)
Relative Index: INVALID (ref 0.0–2.5)
Total CK: 49 U/L (ref 7–177)
Total CK: 51 U/L (ref 7–177)

## 2010-08-14 LAB — D-DIMER, QUANTITATIVE: D-Dimer, Quant: 0.54 ug/mL-FEU — ABNORMAL HIGH (ref 0.00–0.48)

## 2010-08-14 LAB — DIFFERENTIAL
Lymphocytes Relative: 22 % (ref 12–46)
Monocytes Absolute: 0.5 10*3/uL (ref 0.1–1.0)
Monocytes Relative: 7 % (ref 3–12)
Neutro Abs: 4.9 10*3/uL (ref 1.7–7.7)

## 2010-08-14 LAB — URINE MICROSCOPIC-ADD ON

## 2010-08-14 LAB — RAPID URINE DRUG SCREEN, HOSP PERFORMED: Barbiturates: NOT DETECTED

## 2010-09-18 NOTE — Discharge Summary (Signed)
[name], [name]                  ACCOUNT NO.:  [id number]   MEDICAL RECORD NO.:  [id number]          PATIENT TYPE:  INP   LOCATION:  1440                         FACILITY:  [facility]   PHYSICIAN:  Adele Barthel, MD    DATE OF BIRTH:  07/17/1960   DATE OF ADMISSION:  09/26/2008  DATE OF DISCHARGE:  09/29/2008                               DISCHARGE SUMMARY   PRIMARY CARE PHYSICIAN:  None.   ADMITTING HISTORY:  Please refer to the excellent admission note  dictated by Dr. Billey Chang under history of present illness.   DISCHARGE DIAGNOSES:  1. Chest pain.  2. Hypertensive urgency.  3. Diabetes mellitus.   SECONDARY DIAGNOSES:  1. History of hypertension.  2. History of diabetes.   DISCHARGE MEDICATIONS:  1. Norvasc 10 mg p.o. daily.  2. Aspirin 325 mg enteric-coated p.o. daily.  3. Coreg 12.5 mg p.o. q.12 h.  4. Hydrochlorothiazide 25 mg p.o. daily.  5. Clonidine 0.3 mg p.o. 12 h.  6. Glipizide 2.5 mg p.o. daily.  7. Metformin 500 mg p.o. q.12 h.  8. Zocor 10 mg p.o. daily.  9. Omeprazole 20 mg p.o. daily.   HOSPITAL COURSE:  The following issues were addressed during the  hospitalization;  1. Chest pain.  The patient was initially admitted to the step-down      unit because of hypertensive urgency.  Three set of cardiac enzymes      were performed which were negative.  A cardiology consultation was      performed by Dr. Marlou Porch, whose assessment was the most likely cause      of chest pain was hypertensive urgency.  An echocardiogram was done      which did not show any regional wall motion defect, EF was 60-65%.      Cardiac markers were negative.  Telemetry stayed negative.  The      patient became chest pain free once hypertension control was      achieved.  At the time of discharge, the patient is asymptomatic      and hemodynamically stable.  2. Hypertensive emergency.  The patient initially received IV      nitroglycerin drip.  Her blood pressure currently is  well-      controlled on current antihypertensives.  3. Diabetes mellitus.  The patient was started on glipizide, metformin      was continued.  4. Hyperlipidemia.  Zocor was started.   CONSULTATION PERFORMED:  Dr. Marlou Porch on Sep 27, 2008.   PROCEDURE PERFORMED:  None.   IMAGING PERFORMED:  As mentioned in HPI.  In addition, there was a chest  x-ray performed on Sep 26, 2008, which did not show any acute  cardiopulmonary process.   DISPOSITION:  The patient does not have a PCP at this time.  She is  going to establish a primary care physician soon for follow-up of her  diabetes and hypertension.   Total time spent in discharge of this patient 1 hour.      Adele Barthel, MD  Electronically Signed     NP/MEDQ  D:  09/29/2008  T:  09/29/2008  Job:  [id number]

## 2010-09-18 NOTE — H&P (Signed)
[name], ALMONOR                  ACCOUNT NO.:  [id number]   MEDICAL RECORD NO.:  [id number]          PATIENT TYPE:  EMS   LOCATION:  ED                           [facility]ACILITY[facility]  [facility]   PHYSICIAN:  Melissa L. Lovena Le, MD  DATE OF BIRTH:  05/06/1961   DATE OF ADMISSION:  09/26/2008  DATE OF DISCHARGE:                              HISTORY & PHYSICAL   PRIMARY CARE PHYSICIAN:  [name], M.D.   CHIEF COMPLAINT:  Chest pain.   HISTORY OF PRESENT ILLNESS:  The patient is a 50 year old African  American female with past medical history for hypertension and diabetes  who presented to the emergency room this afternoon with 2 days worth of  chest pain which was intermittent in nature.  The patient states that  yesterday and last p.m. she started to have chest pain with associated  sweating.  She states that the pain actually woke her up during the  night.  It persisted on and off today and was associated with some  shortness of breath today only and some nausea.  She also had some  nausea 2-3 days ago.  She locates the pain in the center of her chest,  nonradiating.  Nothing made it feel worse.  The medication given to her  in the emergency room which was 3 nitroglycerin and aspirin caused the  pain to go to 0.  She now just identifies an area of soreness in the  anterior chest wall.  The patient states she has never seen a  cardiologist, that Dr. Kennon Rounds manages her diabetes and her blood  pressure.   PAST MEDICAL HISTORY:  1. Hypertension.  2. Diabetes.   PAST SURGICAL HISTORY:  1. Cholecystectomy.  2. Breast reduction.  3. C-section.   REVIEW OF SYSTEMS:  The patient states that her weight has been the  same.  She has had no fever, chills.  HEENT:  Eyes:  No blurred vision, double vision.  Ears, nose and throat:  No tinnitus, dysphagia discharge.  CARDIOVASCULAR:  Chest pain as above.  RESPIRATORY:  Positive shortness of breath with the pain earlier.  No  cough.  GI:  Positive  nausea, no vomiting.  GU:  No hesitancy, frequency, dysuria.  ENDOCRINE:  She has diabetes.  She denies thyroid disorder.  HEMATOLOGY:  No bleeding disorder.  PSYCHIATRIC:  No depression or anxiety.  NEUROLOGIC:  No headaches or seizure disorder.   ALLERGIES:  LISINOPRIL WHICH CAUSES ANGIOEDEMA.  PLEASE NOTE THAT OUR  COMPUTER LISTS CLONIDINE.  THIS IS NOT CORRECT.  THE PATIENT HAD  ANGIOEDEMA RELATED TO AN ACE INHIBITOR.  SHE ACTUALLY IS TAKING CATAPRES  NOW SUCCESSFULLY.   MEDICATIONS AT HOME:  1. Atenolol 50 mg daily.  2. Catapres 0.3 p.o. b.i.d.  3. Hydrochlorothiazide 25 mg daily.  4. Metformin 500 mg b.i.d.  5. Omeprazole 20 mg daily.  6. Norvasc 5 mg daily.   FAMILY HISTORY:  Mom is living with a history of hypertension and  diabetes.  Dad is deceased with a history of lung cancer.   SOCIAL HISTORY:  She has  two children.  She is separated.  Her contact  person is her daughter, [name], [id number].  She occasionally  smokes.  She does not drink any alcohol, and she does not do illicit  drugs.   PHYSICAL EXAMINATION:  VITAL SIGNS:  The patient is afebrile,  temperature 98.2.  On arrival to the emergency room she was 253/100,  pulse 93, respirations 16, saturation 98%.  Currently she is 75, blood  pressures ranging from 160s to 180s, respirations 12, saturation 100%.  GENERAL:  She is tearful, in no acute distress.  She is normocephalic, atraumatic.  Pupils equal, round and reactive to  light.  Extraocular muscles intact.  I was unable to assess her fundi.  She has anicteric sclerae.  Examination of the ears reveal tympanic  membranes are intact without discharge.  Examination of nose, the septum  midline without discharge.  Examination of mouth reveals no oral lesions  or lip lesions.  Mucous membranes are moist.  NECK:  Supple.  There is no JVD.  No lymph nodes, no carotid bruits.  There is no thyromegaly.  Trachea is in the midline.  CHEST:  Decreased with  distant breath sounds.  I do not appreciate any  rhonchi, rales, wheezes.  CARDIOVASCULAR:  Very distant, regular rate, rhythm.  Positive S1 and  S2.  No S3, S4.  I do not appreciate a murmur, rub or gallop.  There is  no heave or thrill.  ABDOMEN:  Morbidly obese, nontender, nondistended  with positive bowel sounds.  There is no hepatosplenomegaly.  No  guarding or rebound.  No bruits.  EXTREMITIES:  No clubbing, cyanosis or edema.  NEUROLOGIC:  She is awake, alert, oriented.  Cranial nerves II-XII are  intact.  Power is 5/5.  DTRs are 2+.  Plantars are downgoing.  PSYCHIATRIC:  Affect is tearful.  She is scared, but her recent and  remote memory seem to be impaired by this anxiety.  She is unable to  give me her medication list.  Judgment and insight are intact, however.   EKG shows 94 beats per minute with LVH, no acute ST-T wave changes  noted.   White count 7.0, hemoglobin 15.1, hematocrit 46.0, platelets of 298.  Sodium 140, potassium 3.6, chloride 105, CO2 30, BUN 5, creatinine 0.6  and glucose is 150.  Urine pregnancy is negative.  Initial myoglobin is  65.7 with an MB of less than 1.30, troponin less than 0.05.  Her chest x-  ray shows no acute cardiopulmonary disease.   ASSESSMENT/PLAN:  This is a 50 year old African American female with a  history of diabetes, hypertension who presents with chest pain  associated with nausea, vomiting, sweating and hypertension.  She states  that the pain was relieved with the sublingual nitro and aspirin x3 and  she was also given Tylenol.  1. Chest pain history with hypertensive urgency.  Will admit her to      telemetry, rule her out for myocardial infarction on serial cardiac      markers.  I will consult cardiology unassigned for possible future      intervention, whether that be stress testing or cardiac      catheterization.  She has had her aspirin.  We will continue that.      She will be on Lovenox 1 mg/kg.  I will continue the  nitro paste 1      inch q.6 hours.  2. Pulmonary.  I will check a D-dimer.  If this  is high, may consider      CT scan of the chest.  For now, her obvious hypertensive urgency is      the likely source for her chest pain.  3. Gastrointestinal.  She has no complaints and did not give me a      history but is on omeprazole in the outpatient setting.  I did      speak with the clinic, so we will continue omeprazole.  4. Genitourinary.  She has no complaints but I will check a UA and a      UDS.  5. Endocrine.  Diabetes.  Will check a hemoglobin A1c.  Will continue      her metformin and use sliding scale insulin.  Also will check a      lipid panel and TSH.   Total time on this case was from 3:00 p.m. to 4:15 p.m.      Melissa L. Lovena Le, MD  Electronically Signed     MLT/MEDQ  D:  09/26/2008  T:  09/26/2008  Job:  [id number]   cc:   Standley Dakins. Kennon Rounds, M.D.

## 2010-09-18 NOTE — Consult Note (Signed)
[name], [name]                  ACCOUNT NO.:  [id number]   MEDICAL RECORD NO.:  [id number]          PATIENT TYPE:  INP   LOCATION:  1231                         FACILITY:  [facility]   PHYSICIAN:  Jerline Pain, MD      DATE OF BIRTH:  [birth year]   DATE OF CONSULTATION:  09/27/2008  DATE OF DISCHARGE:                                 CONSULTATION   REASON FOR CONSULTATION:  Ms. Feeser is being seen at the request of Dr.  Colin Ina for the evaluation of hypertensive emergency with chest pain.   HISTORY OF PRESENT ILLNESS:  A 50 year old female with morbid obesity,  hypertension, hyperlipidemia, diabetes, tobacco use with no early family  history of coronary artery disease who presented to the [facility]  Emergency Department with chief complaint of chest discomfort.  Yesterday morning, she was awoken by a dull pressure sensation,  substernal chest pain, describing mild shortness of breath and  diaphoresis as well as mild nausea.  This prompted evaluation at the  emergency department, and when she arrived she was found to have a blood  pressure of 255/100.  The chest pain was considered to be moderate to  severe and occurred at rest.  She has had difficulty in the past  controlling her blood pressure.  She does not have a primary care  physician currently, but she does see an OB/GYN physician.  At home, she  is supposed to be taking atenolol, clonidine, hydrochlorothiazide, as  well as metformin.   Once in the emergency department, she was given nitroglycerin which did  help both relieve her blood pressure and her chest discomfort.  She was  then placed in the [place] ICU overnight for careful observation and  careful monitoring of blood pressure and symptoms.  Currently, her blood  pressure is 163/87.  She currently is feeling comfortable without any  complaints of any chest discomfort or shortness of breath.   PAST MEDICAL HISTORY:  As described above.   PAST SURGICAL HISTORY:   Cholecystectomy and breast reduction.   ALLERGIES:  LISINOPRIL caused angioedema.  Please avoid ACE inhibitors.   SOCIAL HISTORY:  She has 2 children.  She does smoke.  No alcohol use.  No illicit drug use.  She denied any cocaine given the hypertensive  emergency.   FAMILY HISTORY:  Her mother has hypertension and diabetes.  Her father  died of lung cancer but no early family history of coronary artery  disease.   REVIEW OF SYSTEMS:  Occasional joint pains obesity.  No fevers.  No  chills.  No orthopnea.  Mild shortness of breath.  Chest discomfort as  noted above.  No syncope.  No bleeding.  Unless specified above, all  other 12 review of systems negative.   PHYSICAL EXAMINATION:  VITAL SIGNS:  Blood pressure currently 163/87,  was 250/100 on arrival.  Afebrile 98.7 with a heart rate mostly in the  80s, sating 99% on room air.  GENERAL:  Alert and oriented x3 in no acute distress, resting  comfortably in bed.  EYES:  Well-perfused conjunctivae.  EOMI.  No scleral icterus.  Sclerae  are clear.  NECK:  Thick.  Full range of motion.  No carotid bruits.  No JVD  appreciated.  No thyromegaly.  No lymphadenopathy appreciated.  CARDIOVASCULAR:  Regular rate and rhythm.  Somewhat distant heart sounds  secondary to body habitus.  I do not appreciate an S4 currently.  She  does have soft 1/6 systolic murmur heard at right upper sternal border.  LUNGS:  Clear to auscultation bilaterally.  No wheezes.  No rales.  Normal respiratory effort.  ABDOMEN:  Obese.  Positive bowel sounds.  Cannot palpate liver margin.  No bruits.  EXTREMITIES:  No clubbing, cyanosis, or any significant edema.  Pulses  2+ distally.  NEUROLOGIC:  Nonfocal.  Moves all extremities.  No tremors noted.  SKIN:  Warm, dry, and intact.  No rashes noted.  PSYCHIATRIC:  Normal affect.   DATA:  ECGs personally reviewed shows sinus rhythm with left ventricular  hypertrophy and subtle evidence of repolarization abnormality.   No  evidence of ST-segment elevation or depression or Q-waves present.  Ventricular rate was in the 80s to 90s.  Chest x-ray personally reviewed  shows no active cardiopulmonary disease, possible cardiac enlargement.  Prior CT angiogram of chest on October 29, 2006, showed cardiomegaly,  personally reviewed.  There is no significant coronary artery  calcification present.  Normal anatomical takeoff of coronary arteries.  Cardiac biomarkers are negative x3.  BNP was 66 and normal.  Urine  pregnancy was normal.  Urine drug screen was negative.  D-dimer mildly  elevated at 0.54.  Hemoglobin A1c 8.4, elevated.  Total cholesterol 288,  HDL 31, LDL 154, triglycerides are 116.  Sodium 138, potassium 3.6, BUN  6, creatinine 0.5, glucose 202.  White count 7.2, hemoglobin 13.1,  hematocrit 40.9, platelet count 279.   ASSESSMENT AND PLAN:  A 50 year old female with morbid obesity,  hypertension, hyperlipidemia, diabetes, smoking history, admitted with  hypertensive emergency categorized by chest discomfort and mild  shortness of breath.  1. Hypertensive emergency - this has been treated very well overnight,      and her chest discomfort is relieved with reduction in her blood      pressure.  Her nitroglycerin currently is at a very, very small      dose of 5 mcg per minute and can be weaned to off.  My      recommendations currently are to change her atenolol from 50 mg      once a day to carvedilol 12.5 mg twice a day for added alpha      inhibition.  Continue her hydrochlorothiazide 25 mg once a day as      well as Norvasc 10 mg once a day and clonidine 0.3 mg twice a day.      If she remains chest pain free after control of her blood pressure      has been established, then with her negative cardiac biomarkers and      control over the hypertensive emergency, I am pleased with the      situation.  If she does have recurrence of chest discomfort despite      blood pressure control, at that point we  would consider further      testing such as cardiac stress test.  At this time, I do believe      that control her hypertension is #1 goal, and when this was      accomplished her chest pain was  relieved.  2. Tobacco cessation has been discussed at length given her continued      smoking.  This also can raise blood pressure as I explained to her.  3. Hypertension - as described above and hypertensive emergency, salt      reduction, fluid reduction, NSAIDs, avoid Sudafed.  Needs to      maintain medical compliance.  4. Obesity - encouraged and diet exercise  5. Diabetes - A1c is 8.4, mildly elevated.  She is on metformin at      home, may need additional medication or uptitration of dose.  She      also should establish primary care physician locally.  6. Hyperlipidemia - LDL in the 150 range.  With her strong family      history of diabetes, I have started her on pravastatin 20 mg once a      day.   I will follow along with you.       Jerline Pain, MD  Electronically Signed     MCS/MEDQ  D:  09/27/2008  T:  09/27/2008  Job:  [id number]   cc:   Champ Mungo. Lovena Le, [address] [place]  [place] [address]

## 2010-09-21 NOTE — Consult Note (Signed)
NAMESHERIN, LEDINGHAM                  ACCOUNT NO.:  [id number]   MEDICAL RECORD NO.:  [id number]          PATIENT TYPE:  EMS   LOCATION:  ED                           FACILITY:  [facility]   PHYSICIAN:  Pramod P. Leonie Man, MD    DATE OF BIRTH:  1960/12/12   DATE OF CONSULTATION:  DATE OF DISCHARGE:                                   CONSULTATION   REFERRING PHYSICIAN:  [facility].   REASON FOR REFERRAL:  Headache and weakness.   HISTORY OF PRESENT ILLNESS:  Ms. Orrick is a 50 year old obese African-  American lady who presented with sudden onset of severe headache this  evening as well as nausea and an episode of vomiting and generalized  weakness.  She states that the headache began abruptly and was severe, has  not been able to shake it, could not get up because of the headache.  She  denies any slurred speech or loss of vision, double vision, vertigo, focal  extremity weakness and numbness.  She states that she has had headaches in  the past but not as severe as this.  She has a long history of high blood  pressure and states she ran out of blood pressure medications a month ago  and did not have a physician to fill it.  On arrival in the emergency room,  her blood pressure was significantly elevated at 210/110.  She has been  given a dose of labetalol, and she is now down to 180/100.  She was also  given morphine, which has resulted in near-complete resolution of the  headache.  She seems quite comfortable right now and does not have a  significant headache.   PAST MEDICAL HISTORY:  Significant for hypertension.   HOME MEDICATIONS:  Hydrochlorothiazide/triamterene 25/37.5 mg daily.   SOCIAL HISTORY:  She lives with others.  Smokes 1-2 packs per week.  Does  not drink alcohol.  Denies doing drugs.   REVIEW OF SYSTEMS:  Not significant for any chest pain, fever, cough,  shortness of breath, diarrhea, or other illness.   PHYSICAL EXAMINATION:  GENERAL:  An obese African-American lady  who is not  in distress.  VITAL SIGNS:  Temperature 97.6, pulse 105 per minute, respirations 26 per  minute.  Blood pressure as previously stated.  Respiratory rate 16 per  minute.  Distal pulses felt.  HEENT:  Head is normocephalic and atraumatic.  NECK:  Supple without any neck stiffness or tenderness.  NEUROLOGIC:  Face is symmetric bilaterally.  Movements are normal.  Tongue  is midline.  Motor system exam reveals symmetric upper and lower extremity  strength.  Knee jerks and ankle jerks are not obtained.  Plantars are  downgoing.  There is no sensory loss.  Coordination is intact.  Gait is not  tested.  CARDIAC:  Normal.  No murmur or gallop.  LUNGS:  Clear to auscultation.  ABDOMEN:  Soft and nontender.   DATA REVIEWED:  CT scan of the head, noncontrast study done today, is  unremarkable.  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   No labs are available.   EKG revealed sinus tachycardia with left ventricular hypertrophy.   IMPRESSION:  A 50 year old lady with sudden onset of headache, which seems  to have nearly subsided.  There are no focal symptoms or deficits on exam.  The exact etiology of the headache is uncertain at this point, but since her  symptoms essentially have resolved, she may be discharged home with  instructions to resume her antihypertensives and remain strict control of  hypertension and establish care with a primary care physician.  If the  headache returns, she may call or return for further evaluation.  I do not  believe further diagnostic testing as indicated.  If she has recurrence of  severe headache, may at some point need a spinal tap for further evaluation.  She can follow up in the office electively as necessary.  Thank you for this  referral.            ______________________________  Kathie Rhodes. Leonie Man, MD     PPS/MEDQ  D:  01/23/2005  T:  01/24/2005  Job:  [id number]

## 2010-10-02 ENCOUNTER — Other Ambulatory Visit: Payer: Self-pay | Admitting: Family Medicine

## 2010-10-02 DIAGNOSIS — I10 Essential (primary) hypertension: Secondary | ICD-10-CM

## 2010-10-02 NOTE — Telephone Encounter (Signed)
Refill request

## 2010-11-13 ENCOUNTER — Other Ambulatory Visit: Payer: Self-pay | Admitting: Family Medicine

## 2010-11-13 DIAGNOSIS — I10 Essential (primary) hypertension: Secondary | ICD-10-CM

## 2010-11-13 MED ORDER — CLONIDINE HCL 0.3 MG PO TABS: 0.3000 mg | ORAL_TABLET | Freq: Two times a day (BID) | ORAL | Status: DC

## 2010-11-20 ENCOUNTER — Other Ambulatory Visit: Payer: Self-pay | Admitting: Family Medicine

## 2010-11-21 ENCOUNTER — Other Ambulatory Visit: Payer: Self-pay | Admitting: Family Medicine

## 2010-11-21 MED ORDER — HYDROCHLOROTHIAZIDE 25 MG PO TABS: 25.0000 mg | ORAL_TABLET | Freq: Every day | ORAL | Status: DC

## 2010-11-21 MED ORDER — AMLODIPINE BESYLATE 10 MG PO TABS: 10.0000 mg | ORAL_TABLET | Freq: Every day | ORAL | Status: DC

## 2011-01-28 LAB — I-STAT 8, (EC8 V) (CONVERTED LAB)
Acid-Base Excess: 3 — ABNORMAL HIGH
BUN: 11
Chloride: 107
HCT: 43
Hemoglobin: 14.6
Operator id: 277751
Potassium: 3.7
Sodium: 139
pCO2, Ven: 38 — ABNORMAL LOW

## 2011-02-20 ENCOUNTER — Other Ambulatory Visit: Payer: Self-pay | Admitting: Family Medicine

## 2011-02-20 LAB — DIFFERENTIAL
Lymphocytes Relative: 23
Lymphs Abs: 2.1
Monocytes Relative: 9
Neutro Abs: 6
Neutrophils Relative %: 65

## 2011-02-20 LAB — I-STAT 8, (EC8 V) (CONVERTED LAB)
Acid-Base Excess: 1
BUN: 6
Chloride: 109
HCT: 41
Hemoglobin: 13.9
Operator id: 270651
Sodium: 140
pCO2, Ven: 38.4 — ABNORMAL LOW

## 2011-02-20 LAB — CBC
Platelets: 441 — ABNORMAL HIGH
RBC: 5.06
WBC: 9.3

## 2011-02-20 LAB — POCT I-STAT CREATININE
Creatinine, Ser: 0.6
Operator id: 270651

## 2011-02-20 LAB — D-DIMER, QUANTITATIVE: D-Dimer, Quant: 0.51 — ABNORMAL HIGH

## 2011-02-20 LAB — POCT CARDIAC MARKERS
CKMB, poc: 1.3
Myoglobin, poc: 56.2
Operator id: 270651

## 2011-03-11 ENCOUNTER — Telehealth: Payer: Self-pay | Admitting: Family Medicine

## 2011-03-11 ENCOUNTER — Ambulatory Visit (INDEPENDENT_AMBULATORY_CARE_PROVIDER_SITE_OTHER): Payer: Medicaid Other | Admitting: Family Medicine

## 2011-03-11 ENCOUNTER — Encounter: Payer: Self-pay | Admitting: Family Medicine

## 2011-03-11 ENCOUNTER — Other Ambulatory Visit (HOSPITAL_COMMUNITY)
Admission: RE | Admit: 2011-03-11 | Discharge: 2011-03-11 | Disposition: A | Discharge status: Home / Self Care | Payer: Medicaid Other | Source: Ambulatory Visit | Attending: Family Medicine | Admitting: Family Medicine

## 2011-03-11 DIAGNOSIS — Z01419 Encounter for gynecological examination (general) (routine) without abnormal findings: Secondary | ICD-10-CM | POA: Insufficient documentation

## 2011-03-11 DIAGNOSIS — Z124 Encounter for screening for malignant neoplasm of cervix: Secondary | ICD-10-CM

## 2011-03-11 DIAGNOSIS — Z1231 Encounter for screening mammogram for malignant neoplasm of breast: Secondary | ICD-10-CM

## 2011-03-11 DIAGNOSIS — Z0000 Encounter for general adult medical examination without abnormal findings: Secondary | ICD-10-CM

## 2011-03-11 DIAGNOSIS — I10 Essential (primary) hypertension: Secondary | ICD-10-CM

## 2011-03-11 DIAGNOSIS — E119 Type 2 diabetes mellitus without complications: Secondary | ICD-10-CM

## 2011-03-11 MED ORDER — AMLODIPINE BESYLATE 10 MG PO TABS: 10.0000 mg | ORAL_TABLET | Freq: Every day | ORAL | Status: DC

## 2011-03-11 MED ORDER — PROMETHAZINE HCL 25 MG PO TABS: 12.5000 mg | ORAL_TABLET | Freq: Four times a day (QID) | ORAL | Status: DC

## 2011-03-11 MED ORDER — OMEPRAZOLE 20 MG PO CPDR: 20.0000 mg | DELAYED_RELEASE_CAPSULE | Freq: Two times a day (BID) | ORAL | Status: DC

## 2011-03-11 MED ORDER — LOSARTAN POTASSIUM 25 MG PO TABS: 25.0000 mg | ORAL_TABLET | Freq: Every day | ORAL | Status: DC

## 2011-03-11 MED ORDER — METFORMIN HCL 500 MG PO TABS: 500.0000 mg | ORAL_TABLET | Freq: Two times a day (BID) | ORAL | Status: DC

## 2011-03-11 MED ORDER — CARVEDILOL 25 MG PO TABS: 25.0000 mg | ORAL_TABLET | Freq: Two times a day (BID) | ORAL | Status: DC

## 2011-03-11 MED ORDER — GLUCOSE BLOOD VI STRP: ORAL_STRIP | Status: DC

## 2011-03-11 MED ORDER — FREESTYLE SYSTEM KIT: 1.0000 | PACK | Status: DC | PRN

## 2011-03-11 MED ORDER — CLONIDINE HCL 0.3 MG PO TABS: 0.3000 mg | ORAL_TABLET | Freq: Two times a day (BID) | ORAL | Status: DC

## 2011-03-11 MED ORDER — HYDROCHLOROTHIAZIDE 25 MG PO TABS: 25.0000 mg | ORAL_TABLET | Freq: Every day | ORAL | Status: DC

## 2011-03-11 NOTE — Progress Notes (Addendum)
  Subjective:    Patient ID: [name], female    DOB: 1960/10/15, 50 y.o.   MRN: [id number]  HPI Here today for f/u and yearly exam.  Has not been seen here for some time.  Also, has not had health insurance and so has not had any important health maintenance done in some time.  Has no meter, or strips to check her Bs.  She is taking her medications except for Norvasc which is making her have dark stools.   She had lip swelling with Lisinopril and is not on an ARB.  She takes an ASA daily.  She has not seen opthalmology, had a mammogram, pap smear or colonoscopy.  She is postmenopausal.  She has no h/o abnl pap.  PMH, PSH, FH, SH, Meds, Allergies were all reviewed.  Review of Systems  Constitutional: Negative for fever and appetite change.  HENT: Negative for nosebleeds, congestion and sneezing.   Eyes: Negative for visual disturbance.  Respiratory: Negative for cough, chest tightness, shortness of breath and wheezing.   Cardiovascular: Negative for chest pain.  Gastrointestinal: Positive for nausea and constipation. Negative for vomiting, abdominal pain and diarrhea.  Genitourinary: Negative for dysuria, hematuria, vaginal bleeding and vaginal discharge.  Musculoskeletal: Negative for back pain and joint swelling.  Skin: Negative for rash.  Neurological: Negative for light-headedness, numbness and headaches.  Psychiatric/Behavioral: Negative for behavioral problems and agitation.       Objective:   Physical Exam  Vitals reviewed. Constitutional: She is oriented to person, place, and time. She appears well-developed and well-nourished.  HENT:  Head: Normocephalic and atraumatic.  Eyes: No scleral icterus.  Neck: Neck supple. No thyromegaly present.  Cardiovascular: Normal rate and regular rhythm.   No murmur heard. Pulmonary/Chest: Effort normal and breath sounds normal. Right breast exhibits no mass. Left breast exhibits no mass.       Healed scars from breast reduction noted.    Abdominal: Soft. Bowel sounds are normal. She exhibits no mass. There is no tenderness.  Genitourinary: Vagina normal and uterus normal. Cervix exhibits no motion tenderness and no discharge. Right adnexum displays no mass and no tenderness. Left adnexum displays no mass and no tenderness.  Musculoskeletal: Normal range of motion.  Lymphadenopathy:    She has cervical adenopathy.  Neurological: She is alert and oriented to person, place, and time. No sensory deficit.       Monofilament testing is normal on bilateral lower extremeties  Skin: Skin is warm and dry.  Psychiatric: She has a normal mood and affect.          Assessment:  Yearly exam. HTN-poorly controlled DM-? Control, pt is not checking BS  Plan: Pap smear Mammo Optho for eye exam GI for colonoscopy Declines flu and tdap Fasting labs:   CBC, Hgb A1C, CMP, Lipid, TSH Urine microalbumin Refill meds New glucometer and strips Weight loss, exercise Quit smoking Start ARB F/u in 3 months

## 2011-03-11 NOTE — Patient Instructions (Addendum)
Preventative Care for Adults, Female A healthy lifestyle and preventative care can promote health and wellness. Preventative health guidelines for women include the following key practices:  A routine yearly physical is a good way to check with your caregiver about your health and preventative screening. It is a chance to share any concerns and updates on your health, and to receive a thorough exam.   Visit your dentist for a routine exam and preventative care every 6 months. Brush your teeth twice a day and floss once a day. Good oral hygiene prevents tooth decay and gum disease.   The frequency of eye exams is based on your age, health, family medical history, use of contact lenses, and other factors. Follow your caregiver's recommendations for frequency of eye exams.   Eat a healthy diet. Foods like vegetables, fruits, whole grains, low-fat dairy products, and lean protein foods contain the nutrients you need without too many calories. Decrease your intake of foods high in solid fats, added sugars, and salt. Eat the right amount of calories for you.Get information about a proper diet from your caregiver, if necessary.   Regular physical exercise is one of the most important things you can do for your health. Most adults should get at least 150 minutes of moderate-intensity exercise (any activity that increases your heart rate and causes you to sweat) each week. In addition, most adults need muscle-strengthening exercises on 2 or more days a week.   Maintain a healthy weight. The body mass index (BMI) is a screening tool to identify possible weight problems. It provides an estimate of body fat based on height and weight. Your caregiver can help determine your BMI, and can help you achieve or maintain a healthy weight.For adults 20 years and older:   A BMI below 18.5 is considered underweight.   A BMI of 18.5 to 24.9 is normal.   A BMI of 25 to 29.9 is considered overweight.   A BMI of 30 and  above is considered obese.   Maintain normal blood lipids and cholesterol levels by exercising and minimizing your intake of saturated fat. Eat a balanced diet with plenty of fruit and vegetables. Blood tests for lipids and cholesterol should begin at age 20 and be repeated every 5 years. If your lipid or cholesterol levels are high, you are over 50, or you are a high risk for heart disease, you may need your cholesterol levels checked more frequently.Ongoing high lipid and cholesterol levels should be treated with medicines if diet and exercise are not effective.   If you smoke, find out from your caregiver how to quit. If you do not use tobacco, do not start.   If you are pregnant, do not drink alcohol. If you are breastfeeding, be very cautious about drinking alcohol. If you are not pregnant and choose to drink alcohol, do not exceed 1 drink per day. One drink is considered to be 12 ounces (355 mL) of beer, 5 ounces (148 mL) of wine, or 1.5 ounces (44 mL) of liquor.   Avoid use of street drugs. Do not share needles with anyone. Ask for help if you need support or instructions about stopping the use of drugs.   High blood pressure causes heart disease and increases the risk of stroke. Your blood pressure should be checked at least every 1 to 2 years. Ongoing high blood pressure should be treated with medicines if weight loss and exercise are not effective.   If you are 55 to 52   years old, ask your caregiver if you should take aspirin to prevent strokes.   Diabetes screening involves taking a blood sample to check your fasting blood sugar level. This should be done once every 3 years, after age 45, if you are within normal weight and without risk factors for diabetes. Testing should be considered at a younger age or be carried out more frequently if you are overweight and have at least 1 risk factor for diabetes.   Breast cancer screening is essential preventative care for women. You should  practice "breast self-awareness." This means understanding the normal appearance and feel of your breasts and may include breast self-examination. Any changes detected, no matter how small, should be reported to a caregiver. Women in their 20s and 30s should have a clinical breast exam (CBE) by a caregiver as part of a regular health exam every 1 to 3 years. After age 40, women should have a CBE every year. Starting at age 40, women should consider having a mammogram (breast X-ray) every year. Women who have a family history of breast cancer should talk to their caregiver about genetic screening. Women at a high risk of breast cancer should talk to their caregiver about having an MRI and a mammogram every year.   The Pap test is a screening test for cervical cancer. A Pap test can show cell changes on the cervix that might become cervical cancer if left untreated. A Pap test is a procedure in which cells are obtained and examined from the lower end of the uterus (cervix).   Women should have a Pap test starting at age 21.   Between ages 21 and 29, Pap tests should be repeated every 2 years.   Beginning at age 30, you should have a Pap test every 3 years as long as the past 3 Pap tests have been normal.   Some women have medical problems that increase the chance of getting cervical cancer. Talk to your caregiver about these problems. It is especially important to talk to your caregiver if a new problem develops soon after your last Pap test. In these cases, your caregiver may recommend more frequent screening and Pap tests.   The above recommendations are the same for women who have or have not gotten the vaccine for human papillomavirus (HPV).   If you had a hysterectomy for a problem that was not cancer or a condition that could lead to cancer, then you no longer need Pap tests. Even if you no longer need a Pap test, a regular exam is a good idea to make sure no other problems are starting.   If you  are between ages 65 and 70, and you have had normal Pap tests going back 10 years, you no longer need Pap tests. Even if you no longer need a Pap test, a regular exam is a good idea to make sure no other problems are starting.   If you have had past treatment for cervical cancer or a condition that could lead to cancer, you need Pap tests and screening for cancer for at least 20 years after your treatment.   If Pap tests have been discontinued, risk factors (such as a new sexual partner) need to be reassessed to determine if screening should be resumed.   The HPV test is an additional test that may be used for cervical cancer screening. The HPV test looks for the virus that can cause the cell changes on the cervix. The cells collected   during the Pap test can be tested for HPV. The HPV test could be used to screen women aged 30 years and older, and should be used in women of any age who have unclear Pap test results. After the age of 30, women should have HPV testing at the same frequency as a Pap test.   Colorectal cancer can be detected and often prevented. Most routine colorectal cancer screening begins at the age of 50 and continues through age 75. However, your caregiver may recommend screening at an earlier age if you have risk factors for colon cancer. On a yearly basis, your caregiver may provide home test kits to check for hidden blood in the stool. Use of a small camera at the end of a tube, to directly examine the colon (sigmoidoscopy or colonoscopy), can detect the earliest forms of colorectal cancer. Talk to your caregiver about this at age 50, when routine screening begins. Direct examination of the colon should be repeated every 5 to 10 years through age 75, unless early forms of pre-cancerous polyps or small growths are found.   Practice safe sex. Use condoms and avoid high-risk sexual practices to reduce the spread of sexually transmitted infections (STIs). STIs include gonorrhea,  chlamydia, syphilis, trichomonas, herpes, HPV, and human immunodeficiency virus (HIV). Herpes, HIV, and HPV are viral illnesses that have no cure. They can result in disability, cancer, and death. Sexually active women aged 25 and younger should be checked for Chlamydia. Older women with new or multiple partners should also be tested for Chlamydia. Testing for other STIs is recommended if you are sexually active and at increased risk.   Osteoporosis is a disease in which the bones lose minerals and strength with aging. This can result in serious bone fractures. The risk of osteoporosis can be identified using a bone density scan. Women ages 65 and over and women at risk for fractures or osteoporosis should discuss screening with their caregivers. Ask your caregiver whether you should take a calcium supplement or vitamin D to reduce the rate of osteoporosis.   Menopause can be associated with physical symptoms and risks. Hormone replacement therapy is available to decrease symptoms and risks. You should talk to your caregiver about whether hormone replacement therapy is right for you.   Use sunscreen with skin protection factor (SPF) of 30 or more. Apply sunscreen liberally and repeatedly throughout the day. You should seek shade when your shadow is shorter than you. Protect yourself by wearing long sleeves, pants, a wide-brimmed hat, and sunglasses year round, whenever you are outdoors.   Once a month, do a whole body skin exam, using a mirror to look at the skin on your back. Notify your caregiver of new moles, moles that have irregular borders, moles that are larger than a pencil eraser, or moles that have changed in shape or color.   Stay current with required immunizations.   Influenza. You need a dose every fall (or winter). The composition of the flu vaccine changes each year, so being vaccinated once is not enough.   Pneumococcal polysaccharide. You need 1 to 2 doses if you smoke cigarettes or  if you have certain chronic medical conditions. You need 1 dose at age 65 (or older) if you have never been vaccinated.   Tetanus, diphtheria, pertussis (Tdap, Td). Get 1 dose of Tdap vaccine if you are younger than age 65 years, are over 65 and have contact with an infant, are a healthcare worker, are pregnant, or simply want   to be protected from whooping cough. After that, you need a Td booster dose every 10 years. Consult your caregiver if you have not had at least 3 tetanus and diphtheria-containing shots sometime in your life or have a deep or dirty wound.   HPV. You need this vaccine if you are a woman age 26 years or younger. The vaccine is given in 3 doses over 6 months.   Measles, mumps, rubella (MMR). You need at least 1 dose of MMR if you were born in 1957 or later. You may also need a 2nd dose.   Meningococcal. If you are age 19 to 21 years and a first-year college student living in a residence hall, or have one of several medical conditions, you need to get vaccinated against meningococcal disease. You may also need additional booster doses.   Zoster (shingles). If you are age 60 years or older, you should get this vaccine.   Varicella (chickenpox). If you have never had chickenpox or you were vaccinated but received only 1 dose, talk to your caregiver to find out if you need this vaccine.   Hepatitis A. You need this vaccine if you have a specific risk factor for hepatitis A virus infection or you simply wish to be protected from this disease. The vaccine is usually given as 2 doses, 6 to 18 months apart.   Hepatitis B. You need this vaccine if you have a specific risk factor for hepatitis B virus infection or you simply wish to be protected from this disease. The vaccine is given in 3 doses, usually over 6 months.  Preventative Services / Frequency Ages 19 to 39  Blood pressure check.** / Every 1 to 2 years.   Lipid and cholesterol check.**/ Every 5 years beginning at age 20.    Clinical breast exam.** / Every 3 years for women in their 20s and 30s.   Pap Test.** / Every 2 years from ages 21 through 29. Every 3 years starting at age 30 years through age 65 or 70 with a history of 3 consecutive normal Pap tests.   HPV Screening.** / Every 3 years from ages 30 through ages 65 to 70 with a history of 3 consecutive normal Pap tests.   Skin self-exam. / Monthly.   Influenza immunization.** / Every year.   Pneumococcal polysaccharide immunization.** / 1 to 2 doses if you smoke cigarettes or if you have certain chronic medical conditions.   Tetanus, diphtheria, pertussis (Tdap,Td) immunization. / A one-time dose of Tdap vaccine. After that, you need a Td booster dose every 10 years.   HPV immunization. / 3 doses over 6 months, if 26 and younger.   Measles, mumps, rubella (MMR) immunization. / You need at least 1 dose of MMR if you were born in 1957 or later. You may also need a 2nd dose.   Meningococcal immunization. / 1 dose if you are age 19 to 21 years and a first-year college student living in a residence hall, or have one of several medical conditions, you need to get vaccinated against meningococcal disease. You may also need additional booster doses.   Varicella immunization. **/ Consult your caregiver.   Hepatitis A immunization. ** / Consult your caregiver. 2 doses, 6 to 18 months apart.   Hepatitis B immunization.** / Consult your caregiver. 3 doses usually over 6 months.  Ages 40 to 64  Blood pressure check.** / Every 1 to 2 years.   Lipid and cholesterol check.**/ Every 5 years beginning   at age 20.   Clinical breast exam.** / Every year after age 40.   Mammogram.** / Every year beginning at age 40 and continuing for as long as you are in good health. Consult with your caregiver.   Pap Test.** / Every 3 years starting at age 30 years through age 65 or 70 with a history of 3 consecutive normal Pap tests.   HPV Screening.** / Every 3 years from  ages 30 through ages 65 to 70 with a history of 3 consecutive normal Pap tests.   Fecal occult blood test (FOBT) of stool. / Every year beginning at age 50 and continuing until age 75. You may not have to do this test if you get colonoscopy every 10 years.   Flexible sigmoidoscopy** or colonoscopy.** / Every 5 years for a flexible sigmoidoscopy or every 10 years for a colonoscopy beginning at age 50 and continuing until age 75.   Skin self-exam. / Monthly.   Influenza immunization.** / Every year.   Pneumococcal polysaccharide immunization.** / 1 to 2 doses if you smoke cigarettes or if you have certain chronic medical conditions.   Tetanus, diphtheria, pertussis (Tdap/Td) immunization.** / A one-time dose of Tdap vaccine. After that, you need a Td booster dose every 10 years.   Measles, mumps, rubella (MMR) immunization. / You need at least 1 dose of MMR if you were born in 1957 or later. You may also need a 2nd dose.   Varicella immunization. **/ Consult your caregiver.   Meningococcal immunization.** / Consult your caregiver.     Hepatitis A immunization. ** / Consult your caregiver. 2 doses, 6 to 18 months apart.   Hepatitis B immunization.** / Consult your caregiver. 3 doses, usually over 6 months.  Ages 65 and over  Blood pressure check.** / Every 1 to 2 years.   Lipid and cholesterol check.**/ Every 5 years beginning at age 20.   Clinical breast exam.** / Every year after age 40.   Mammogram.** / Every year beginning at age 40 and continuing for as long as you are in good health. Consult with your caregiver.   Pap Test,** / Every 3 years starting at age 30 years through age 65 or 70 with a 3 consecutive normal Pap tests. Testing can be stopped between 65 and 70 with 3 consecutive normal Pap tests and no abnormal Pap or HPV tests in the past 10 years.   HPV Screening.** / Every 3 years from ages 30 through ages 65 or 70 with a history of 3 consecutive normal Pap tests.  Testing can be stopped between 65 and 70 with 3 consecutive normal Pap tests and no abnormal Pap or HPV tests in the past 10 years.   Fecal occult blood test (FOBT) of stool. / Every year beginning at age 50 and continuing until age 75. You may not have to do this test if you get colonoscopy every 10 years.   Flexible sigmoidoscopy** or colonoscopy.** / Every 5 years for a flexible sigmoidoscopy or every 10 years for a colonoscopy beginning at age 50 and continuing until age 75.   Osteoporosis screening.** / A one-time screening for women ages 65 and over and women at risk for fractures or osteoporosis.   Skin self-exam. / Monthly.   Influenza immunization.** / Every year.   Pneumococcal polysaccharide immunization.** / 1 dose at age 65 (or older) if you have never been vaccinated.   Tetanus, diphtheria, pertussis (Tdap, Td) immunization. / A one-time dose of Tdap   vaccine if you are over 65 and have contact with an infant, are a Dietitian, or simply want to be protected from whooping cough. After that, you need a Td booster dose every 10 years.   Varicella immunization. **/ Consult your caregiver.   Meningococcal immunization.** / Consult your caregiver.   Hepatitis A immunization. ** / Consult your caregiver. 2 doses, 6 to 18 months apart.   Hepatitis B immunization.** / Check with your caregiver. 3 doses, usually over 6 months.  ** Family history and personal history of risk and conditions may change your caregiver's recommendations. Document Released: 06/18/2001 Document Revised: 01/02/2011 Document Reviewed: 09/17/2010 [facility] Patient Information 2012 [place].Calorie Counting Diet A calorie counting diet requires you to eat the number of calories that are right for you in a day. Calories are the measurement of how much energy you get from the food you eat. Eating the right amount of calories is important for staying at a healthy weight. If you eat too many calories,  your body will store them as fat and you may gain weight. If you eat too few calories, you may lose weight. Counting the number of calories you eat during a day will help you know if you are eating the right amount. A Registered Dietitian can determine how many calories you need in a day. The amount of calories needed varies from person to person. If your goal is to lose weight, you will need to eat fewer calories. Losing weight can benefit you if you are overweight or have health problems such as heart disease, high blood pressure, or diabetes. If your goal is to gain weight, you will need to eat more calories. Gaining weight may be necessary if you have a certain health problem that causes your body to need more energy. TIPS Whether you are increasing or decreasing the number of calories you eat during a day, it may be hard to get used to changes in what you eat and drink. The following are tips to help you keep track of the number of calories you eat.  Measure foods at home with measuring cups. This helps you know the amount of food and number of calories you are eating.   Restaurants often serve food in amounts that are larger than 1 serving. While eating out, estimate how many servings of a food you are given. For example, a serving of cooked rice is  cup or about the size of half of a fist. Knowing serving sizes will help you be aware of how much food you are eating at restaurants.   Ask for smaller portion sizes or child-size portions at restaurants.   Plan to eat half of a meal at a restaurant. Take the rest home or share the other half with a friend.   Read the Nutrition Facts panel on food labels for calorie content and serving size. You can find out how many servings are in a package, the size of a serving, and the number of calories each serving has.   For example, a package might contain 3 cookies. The Nutrition Facts panel on that package says that 1 serving is 1 cookie. Below that, it  will say there are 3 servings in the container. The calories section of the Nutrition Facts label says there are 90 calories. This means there are 90 calories in 1 cookie (1 serving). If you eat 1 cookie you have eaten 90 calories. If you eat all 3 cookies, you have eaten 270  calories (3 servings x 90 calories = 270 calories).  The list below tells you how big or small some common portion sizes are.  1 oz.........4 stacked dice.   3 oz........Marland KitchenDeck of cards.   1 tsp.......Marland KitchenTip of little finger.   1 tbs......Marland KitchenMarland KitchenThumb.   2 tbs.......Marland KitchenGolf ball.    cup......Marland KitchenHalf of a fist.   1 cup.......Marland KitchenA fist.  KEEP A FOOD LOG Write down every food item you eat, the amount you eat, and the number of calories in each food you eat during the day. At the end of the day, you can add up the total number of calories you have eaten. It may help to keep a list like the one below. Find out the calorie information by reading the Nutrition Facts panel on food labels. Breakfast  Bran cereal (1 cup, 110 calories).   Fat-free milk ( cup, 45 calories).  Snack  Apple (1 medium, 80 calories).  Lunch  Spinach (1 cup, 20 calories).   Tomato ( medium, 20 calories).   Chicken breast strips (3 oz, 165 calories).   Shredded cheddar cheese ( cup, 110 calories).   Light [place] dressing (2 tbs, 60 calories).   Whole-wheat bread (1 slice, 80 calories).   Tub margarine (1 tsp, 35 calories).   Vegetable soup (1 cup, 160 calories).  Dinner  Pork chop (3 oz, 190 calories).   Brown rice (1 cup, 215 calories).   Steamed broccoli ( cup, 20 calories).   Strawberries (1  cup, 65 calories).   Whipped cream (1 tbs, 50 calories).  Daily Calorie Total: [id number] Document Released: 04/22/2005 Document Revised: 01/02/2011 Document Reviewed: 10/17/2006 [facility] Patient Information 2012 [place].Diabetes, Keeping Your Heart and Blood Vessels Healthy Too much glucose (sugar) in the blood for a long period of time  can cause problems for those who have diabetes. This high blood glucose can damage many parts of the body, such as the heart, blood vessels, eyes, and kidneys. Heart and blood vessel disease can lead to heart attacks and strokes, which is the leading cause of death for people with diabetes. There are many things you can to do slow down or prevent the complications from diabetes. WHAT DO MY HEART AND BLOOD VESSELS DO? Your heart and blood vessels make up your circulatory system. Your heart is a big muscle that pumps blood through your body. Your heart pumps blood carrying oxygen to large blood vessels (arteries) and small blood vessels (capillaries). Other blood vessels, called veins, carry blood back to the heart. HOW DO MY BLOOD VESSELS GET CLOGGED? Several things, including having diabetes, can make your blood cholesterol level too high. Cholesterol is a substance that is made by the body and used for many important functions. It is also found in some food that comes from animals. When cholesterol is too high, the insides of large blood vessels become narrowed, even clogged. Narrowed and clogged blood vessels make it harder for enough blood to get to all parts of your body. This problem is called atherosclerosis. WHAT CAN HAPPEN WHEN BLOOD VESSELS ARE CLOGGED? When arteries become narrowed and clogged, you may have heart problems and are at increased risk for heart attack and stroke:  Chest pain (angina) causes pain in your chest, arms, shoulders, or back. You may feel the pain more when your heart beats faster, such as when you exercise. The pain may go away when you rest. You also may feel very weak and sweaty. If you do not get treatment, chest pain may  happen more often. If diabetes has damaged the heart nerves, you may not feel the chest pain.   Heart attack. A heart attack happens when a blood vessel in or near the heart becomes blocked. Not enough blood can get to that part of the heart muscle so  the area becomes oxygen deprived and the heart muscle may be permanently damaged.  WHAT ARE THE WARNING SIGNS OF A HEART ATTACK? You may have one or more of the following warning signs:  Chest pain or discomfort.   Pain or discomfort in your arms, back, jaw, or neck.   Indigestion or stomach pain.   Shortness of breath.   Sweating.   Nausea or vomiting.   Light-headedness.   You may have no warning signs at all, or they may come and go.  HOW DOES HEART DISEASE CAUSE HIGH BLOOD PRESSURE? Narrowed blood vessels leave a smaller opening for blood to flow through. It is like turning on a garden hose and holding your thumb over the opening. The smaller opening makes the water shoot out with more pressure. In the same way, narrowed blood vessels lead to high blood pressure. Other factors, such as kidney problems and being overweight, can also lead to high blood pressure. Many people with diabetes also have high blood pressure. If you have heart, eye, or kidney problems from diabetes, high blood pressure can make them worse. If you have high blood pressure, ask your caregiver how to lower it. Your caregiver may be asked to take blood pressure medicine every day. Some types of blood pressure medicine can also help keep your kidneys healthy. To lower your blood pressure, your may be asked to lose weight; eat more fruits and vegetables; eat less salt and high-sodium foods, such as canned soups, luncheon meats, salty snack foods, and fast foods; and drink less alcohol. WHAT ARE THE WARNING SIGNS OF A STROKE? A stroke happens when part of your brain is not getting enough blood and stops working. Depending on the part of the brain that is damaged, a stroke can cause:  Sudden weakness or numbness of your face, arm, or leg on one side of your body.   Sudden confusion, trouble talking, or trouble understanding.   Sudden dizziness, loss of balance, or trouble walking.   Sudden trouble seeing in one or  both eyes or sudden double vision.   Sudden severe headache.  Sometimes, one or more of these warning signs may happen and then disappear. You might be having a "mini-stroke," also called a TIA (transient ischemic attack). If you have any of these warning signs, tell your caregiver right away. HOW CAN CLOGGED BLOOD VESSELS HURT MY LEGS AND FEET? Peripheral vascular disease can happen when the openings in your blood vessels become narrow and not enough blood gets to your legs and feet. You may feel pain in your buttocks, the back of your legs, or your thighs when you stand, walk, or exercise. Sometimes, surgery is necessary to treat this problem. WHAT CAN I DO TO KEEP MY BLOOD VESSELS HEALTHY AND PREVENT HEART DISEASE AND STROKE?  Keep your blood glucose under control. An A1c blood test will probably be ordered by your caregiver at least twice a year. The A1c test tells you your average blood glucose for the past 2 to 3 months and can give valuable information on the overall control of your diabetes.   Keep your blood pressure under control. Have it checked at every health care visit. If you are  on medication to control blood pressure, take it exactly as prescribed. The target for most people is below 130/80.   Keep your cholesterol under control. Have it checked at least once a year. The targets for most people are:   LDL (bad) cholesterol: below 100 mg/dl.   HDL (good) cholesterol: above 40 mg/dl in men and above 50 mg/dl in women.   Triglycerides (another type of fat in the blood): below 150 mg/dl.   Make physical activity a part of your daily routine. Aim for at least 30 minutes of exercise most days of the week. Check with your caregiver to learn what activities are best for you.   Make sure that the foods you eat are "heart-healthy." Include foods high in fiber, such as oat bran, oatmeal, whole-grain breads and cereals, fruits, and vegetables. Cut back on foods high in saturated fat or  cholesterol, such as meats, butter, dairy products with fat, eggs, shortening, lard, and foods with palm oil or coconut oil.   Maintain a healthy weight. If you are overweight, try to exercise most days of the week. See a registered dietitian for help in planning meals and lowering the fat and calorie content.   If you smoke, QUIT. Your caregiver can tell you about ways to help you quit smoking.   Ask your caregiver whether you should take an aspirin every day. Studies have shown that taking a low dose of aspirin every day can help reduce your risk of heart disease and stroke.   Take your medicines as directed.  SEEK MEDICAL CARE IF:   You have any of the warning signs of a heart attack or stroke.   If you have pain in your legs or feet when walking.   If your feet and legs are cool or cold to the touch.  FOR MORE INFORMATION   Diabetes educators (nurses, dietitians, pharmacists, and other health professionals).   To find a diabetes educator near you, call the American Association of Diabetes Educators (AADE) toll-free at 1-800-TEAMUP4 573-020-7354), or look on the Internet at www.diabeteseducator.org and click on "Find a Diabetes Educator."   Dietitians.   To find a dietitian near you, call the [place] toll-free at 9011144992, or look on the Internet at [place].org and click on "Find a Nutrition Professional."   Government.   The National Heart, Lung, and [place] ([place]) is part of the [employer]. To learn more about heart and blood vessel problems, write or call [facility], [id number], [place] [address], [phone]; or see https://wilson-eaton.com/ on the Internet.   To get more information about taking care of diabetes, contact:  [place] [address] Phone: [phone] or [phone] Fax: [phone] Email:  ndic@info .AmenCredit.is Internet: www.diabetes.AmenCredit.is National Diabetes Education Program [address] [place], [place] [address] Phone: [phone] Fax: [phone] Internet: lockgannon.com American Diabetes Association [address] [place] [address] Phone: [phone] Internet: www.diabetes.[place] [address] [place] [address] Phone: [phone] Internet: www.jdrf.org Document Released: 04/25/2003 Document Revised: 01/02/2011 Document Reviewed: 09/30/2008 [facility] Patient Information 2012 [place], [place].You Can Quit Smoking If you are ready to quit smoking or are thinking about it, congratulations! You have chosen to help yourself be healthier and live longer! There are lots of different ways to quit smoking. Nicotine gum, nicotine patches, a nicotine inhaler, or nicotine nasal spray can help with physical craving. Hypnosis, support groups, and medicines  help break the habit of smoking. TIPS TO GET OFF AND STAY OFF CIGARETTES  Learn to predict your moods. Do not let a bad situation be your excuse to have a cigarette. Some situations in your life might tempt you to have a cigarette.   Ask friends and co-workers not to smoke around you.   Make your home smoke-free.   Never have "just one" cigarette. It leads to wanting another and another. Remind yourself of your decision to quit.   On a card, make a list of your reasons for not smoking. Read it at least the same number of times a day as you have a cigarette. Tell yourself everyday, "I do not want to smoke. I choose not to smoke."   Ask someone at home or work to help you with your plan to quit smoking.   Have something planned after you eat or have a cup of coffee. Take a walk or get other exercise to perk you up. This will help to keep you from overeating.   Try a relaxation exercise to calm you down and decrease your stress.  Remember, you may be tense and nervous the first two weeks after you quit. This will pass.   Find new activities to keep your hands busy. Play with a pen, coin, or rubber band. Doodle or draw things on paper.   Brush your teeth right after eating. This will help cut down the craving for the taste of tobacco after meals. You can try mouthwash too.   Try gum, breath mints, or diet candy to keep something in your mouth.  IF YOU SMOKE AND WANT TO QUIT:  Do not stock up on cigarettes. Never buy a carton. Wait until one pack is finished before you buy another.   Never carry cigarettes with you at work or at home.   Keep cigarettes as far away from you as possible. Leave them with someone else.   Never carry matches or a lighter with you.   Ask yourself, "Do I need this cigarette or is this just a reflex?"   Bet with someone that you can quit. Put cigarette money in a piggy bank every morning. If you smoke, you give up the money. If you do not smoke, by the end of the week, you keep the money.   Keep trying. It takes 21 days to change a habit!   Talk to your doctor about using medicines to help you quit. These include nicotine replacement gum, lozenges, or skin patches.  Document Released: 02/16/2009 Document Revised: 01/02/2011 Document Reviewed: 02/16/2009 [facility] Patient Information 2012 [place].

## 2011-03-11 NOTE — Telephone Encounter (Signed)
Opened in error

## 2011-03-12 ENCOUNTER — Other Ambulatory Visit: Payer: Medicaid Other

## 2011-03-12 ENCOUNTER — Telehealth: Payer: Self-pay | Admitting: *Deleted

## 2011-03-12 LAB — MICROALBUMIN / CREATININE URINE RATIO
Creatinine, Urine: 107.9 mg/dL
Microalb Creat Ratio: 19.5 mg/g (ref 0.0–30.0)

## 2011-03-12 NOTE — Telephone Encounter (Signed)
PA request received for Losartan. Will place in MD box. Page sent to Dr. Kennon Rounds also.

## 2011-03-15 ENCOUNTER — Other Ambulatory Visit (INDEPENDENT_AMBULATORY_CARE_PROVIDER_SITE_OTHER): Payer: Medicaid Other

## 2011-03-15 DIAGNOSIS — E119 Type 2 diabetes mellitus without complications: Secondary | ICD-10-CM

## 2011-03-15 LAB — CBC
MCH: 26.7 pg (ref 26.0–34.0)
Platelets: 394 10*3/uL (ref 150–400)
RBC: 5.25 MIL/uL — ABNORMAL HIGH (ref 3.87–5.11)
WBC: 8.9 10*3/uL (ref 4.0–10.5)

## 2011-03-15 LAB — COMPREHENSIVE METABOLIC PANEL
ALT: 13 U/L (ref 0–35)
CO2: 30 mEq/L (ref 19–32)
Potassium: 4.1 mEq/L (ref 3.5–5.3)
Sodium: 142 mEq/L (ref 135–145)
Total Bilirubin: 0.5 mg/dL (ref 0.3–1.2)
Total Protein: 7.3 g/dL (ref 6.0–8.3)

## 2011-03-15 LAB — LIPID PANEL
Cholesterol: 209 mg/dL — ABNORMAL HIGH (ref 0–200)
LDL Cholesterol: 146 mg/dL — ABNORMAL HIGH (ref 0–99)
VLDL: 14 mg/dL (ref 0–40)

## 2011-03-15 LAB — TSH: TSH: 0.951 u[IU]/mL (ref 0.350–4.500)

## 2011-03-15 NOTE — Progress Notes (Signed)
Cmp,flp,tsh,cbc and a1c done today [facility] Kendyl Festa

## 2011-03-15 NOTE — Telephone Encounter (Signed)
Form faxed to insurance 

## 2011-03-18 ENCOUNTER — Encounter: Payer: Self-pay | Admitting: Family Medicine

## 2011-03-18 NOTE — Telephone Encounter (Signed)
Pharmacy notified that approval has been received from [facility] for losartan.

## 2011-03-20 ENCOUNTER — Ambulatory Visit
Admission: RE | Admit: 2011-03-20 | Discharge: 2011-03-20 | Disposition: A | Discharge status: Home / Self Care | Payer: Medicaid Other | Source: Ambulatory Visit | Attending: Family Medicine | Admitting: Family Medicine

## 2011-03-20 DIAGNOSIS — Z1231 Encounter for screening mammogram for malignant neoplasm of breast: Secondary | ICD-10-CM

## 2011-03-20 IMAGING — MG MM DIGITAL SCREENING BILAT W/ CAD
5 series · 5 of 5 positions shown · non-contrast
Comparison: none

DG SCREEN MAMMOGRAM BILATERAL
Bilateral CC and MLO view(s) were taken.
Technologist: LILL-TONE

DIGITAL SCREENING MAMMOGRAM WITH CAD:
The breast tissue is almost entirely fatty.  No masses or malignant type calcifications are 
identified.  Compared with prior studies.
Images were processed with CAD.

[R CC (1 of 2)]
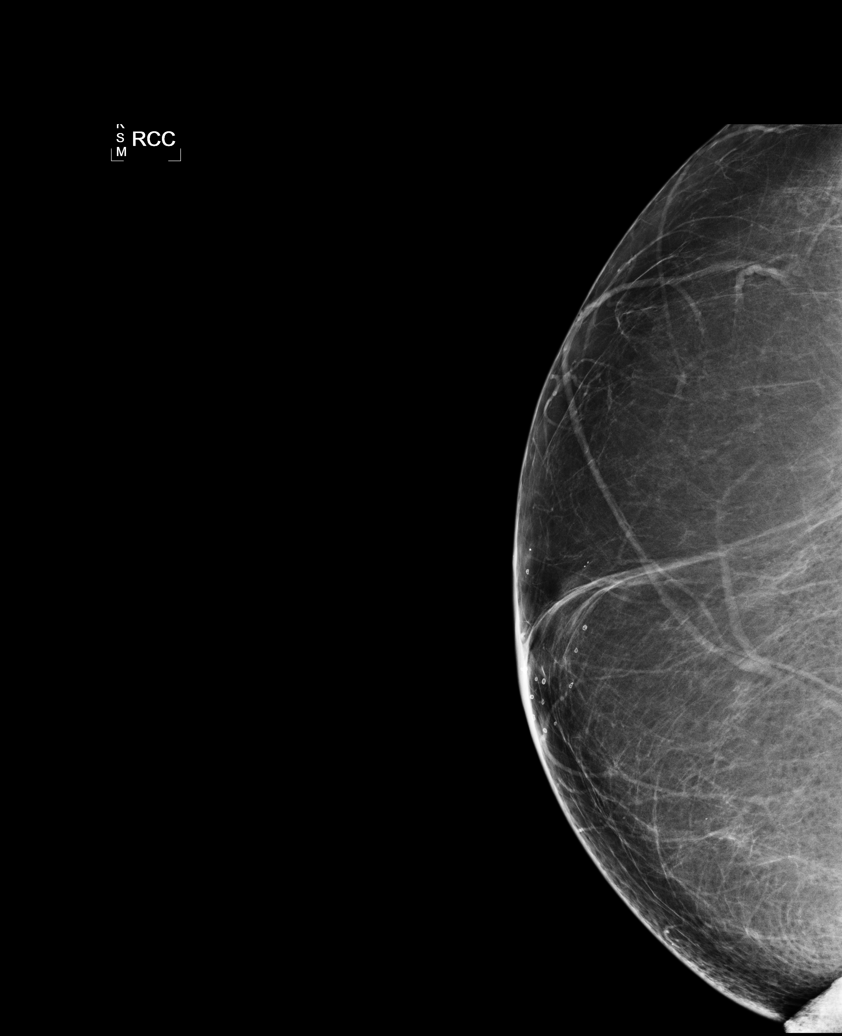

[L CC]
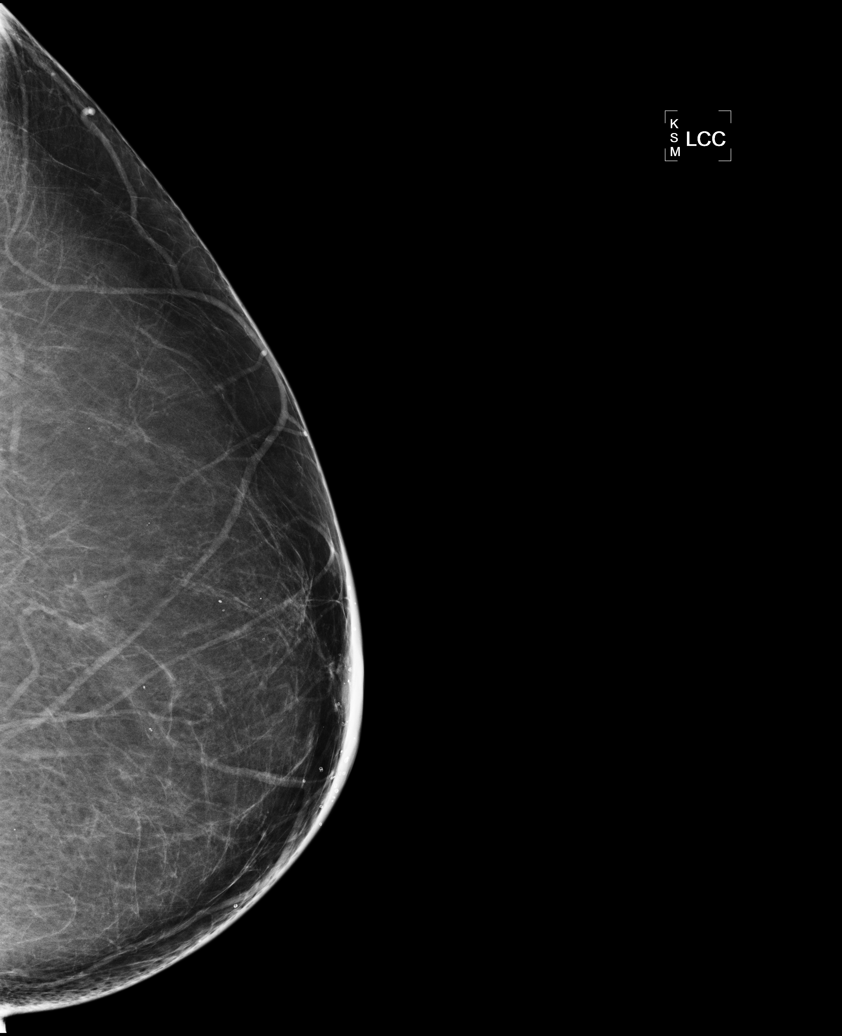

[L MLO]
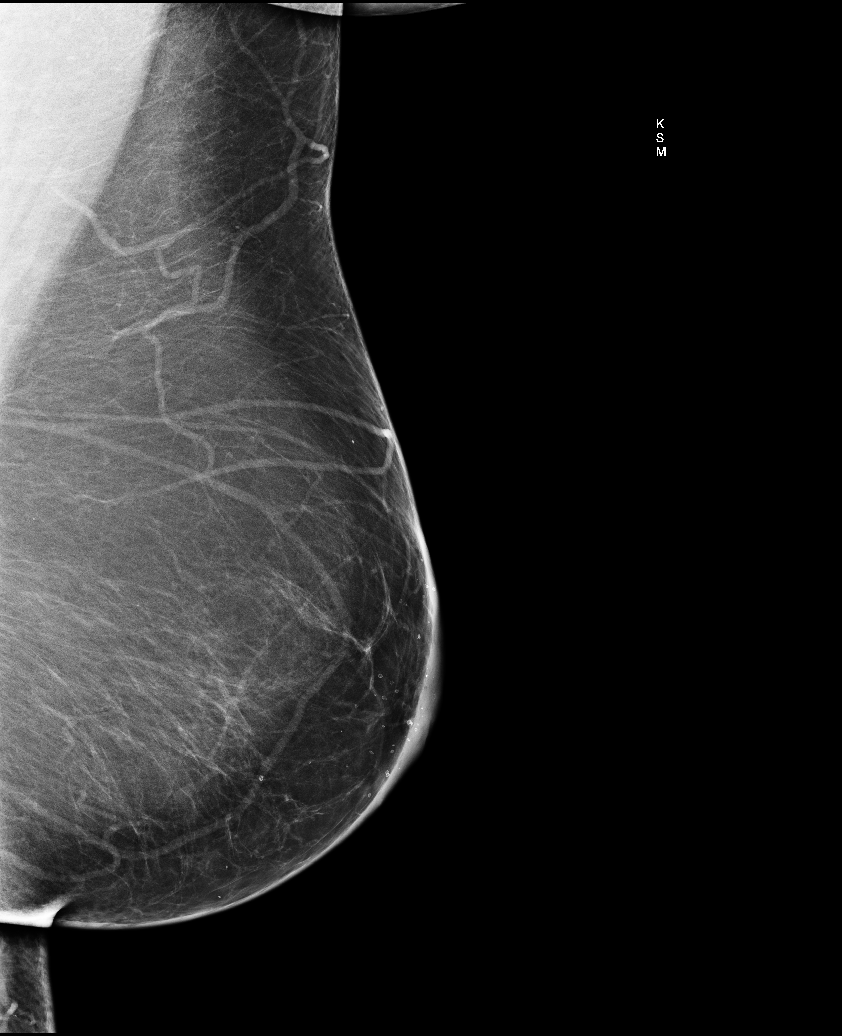

[R MLO]
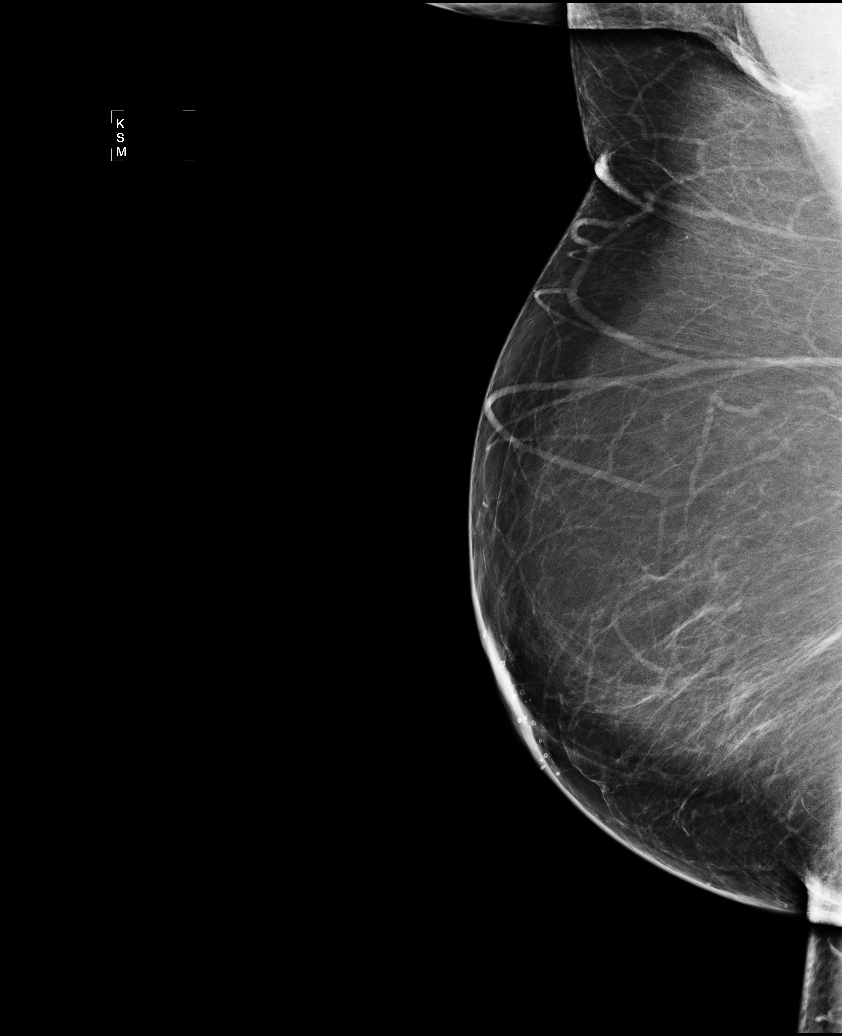

[R CC (2 of 2)]
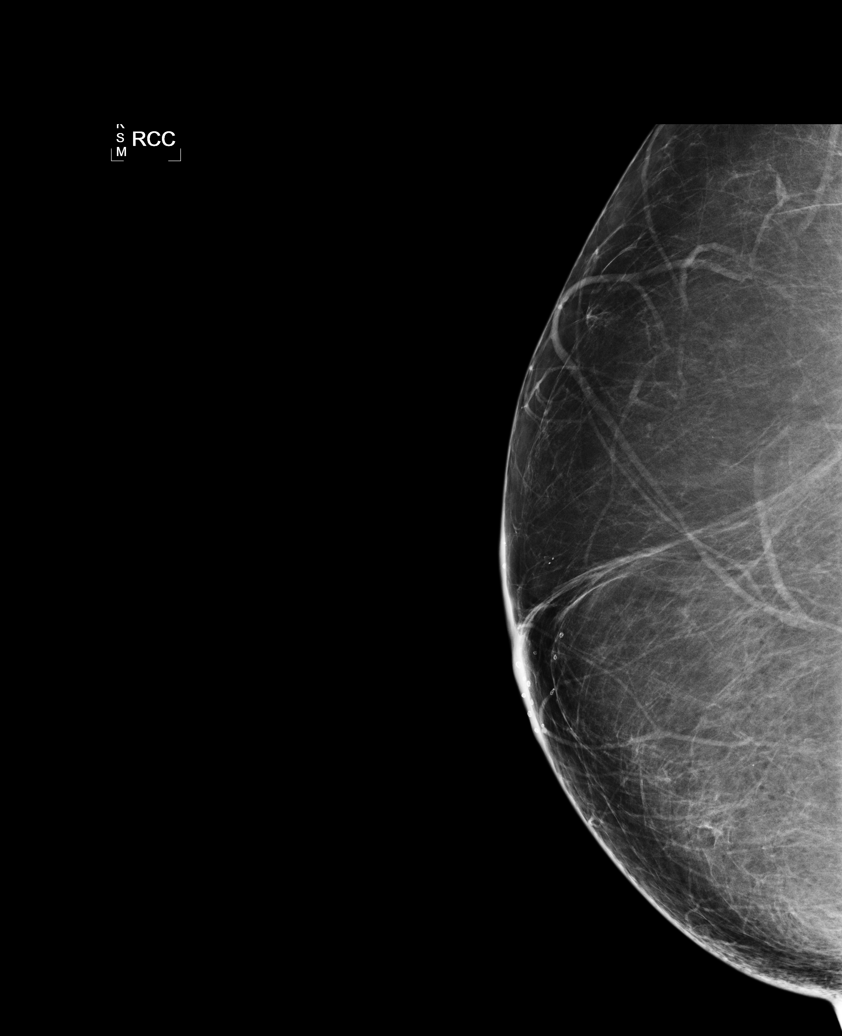

[5 of 5 positions shown; findings below may reference images not displayed]

IMPRESSION: No specific mammographic evidence of malignancy.  Next screening mammogram is recommended in one 
year.

A result letter of this screening mammogram will be mailed directly to the patient.

ASSESSMENT: Negative - BI-RADS 1

Screening mammogram in 1 year.
,

## 2011-04-01 ENCOUNTER — Telehealth: Payer: Self-pay | Admitting: *Deleted

## 2011-04-01 NOTE — Telephone Encounter (Signed)
Pt. No show for previsit for colon.  Unable to reach by phone.  Procedure cancelled and no show letter sent

## 2011-04-05 ENCOUNTER — Encounter: Payer: Self-pay | Admitting: Internal Medicine

## 2011-04-19 ENCOUNTER — Other Ambulatory Visit: Payer: Medicaid Other | Admitting: Internal Medicine

## 2011-04-23 ENCOUNTER — Ambulatory Visit (AMBULATORY_SURGERY_CENTER): Payer: Medicaid Other

## 2011-04-23 VITALS — Ht 71.0 in | Wt 296.2 lb

## 2011-04-23 DIAGNOSIS — Z801 Family history of malignant neoplasm of trachea, bronchus and lung: Secondary | ICD-10-CM

## 2011-04-23 DIAGNOSIS — Z1211 Encounter for screening for malignant neoplasm of colon: Secondary | ICD-10-CM

## 2011-04-23 MED ORDER — PEG-KCL-NACL-NASULF-NA ASC-C 100 G PO SOLR: 1.0000 | Freq: Once | ORAL | Status: AC

## 2011-05-14 ENCOUNTER — Encounter: Payer: Self-pay | Admitting: Internal Medicine

## 2011-05-14 ENCOUNTER — Ambulatory Visit (AMBULATORY_SURGERY_CENTER): Payer: Medicaid Other | Admitting: Internal Medicine

## 2011-05-14 VITALS — BP 134/63 | HR 89 | Temp 97.8°F | Resp 10 | Ht 71.0 in | Wt 296.0 lb

## 2011-05-14 DIAGNOSIS — Z1211 Encounter for screening for malignant neoplasm of colon: Secondary | ICD-10-CM

## 2011-05-14 LAB — GLUCOSE, CAPILLARY: Glucose-Capillary: 156 mg/dL — ABNORMAL HIGH (ref 70–99)

## 2011-05-14 MED ORDER — SODIUM CHLORIDE 0.9 % IV SOLN: 500.0000 mL | INTRAVENOUS | Status: DC

## 2011-05-14 NOTE — Progress Notes (Signed)
Patient did not experience any of the following events: a burn prior to discharge; a fall within the facility; wrong site/side/patient/procedure/implant event; or a hospital transfer or hospital admission upon discharge from the facility. [phone]) Patient with preoperative order for IV antibiotic SSI prophylaxis, antibiotic initiated on time. [phone])

## 2011-05-14 NOTE — Op Note (Signed)
[facility] [address] [facility] [address] [place], [place]  [id number]  COLONOSCOPY PROCEDURE REPORT  PATIENT:  Carolyn, Brown  MR#:  [id number] BIRTHDATE:  [birth year], 50 yrs. old  GENDER:  female ENDOSCOPIST:  Lajuan Lines. Mylin Hirano, MD REF. BY:  Jeannetta Nap, M.D. PROCEDURE DATE:  05/14/2011 PROCEDURE:  Colonoscopy [id number] ASA CLASS:  Class II INDICATIONS:  Routine Risk Screening MEDICATIONS:   These medications were titrated to patient response per physician's verbal order, Versed 8 mg IV, Fentanyl 75 mcg IV  DESCRIPTION OF PROCEDURE:   After the risks benefits and alternatives of the procedure were thoroughly explained, informed consent was obtained.  Digital rectal exam was performed and revealed no rectal masses.   The LB [id number]-[id number] endoscope was introduced through the anus and advanced to the terminal ileum which was intubated for a short distance, without limitations. The quality of the prep was good, using MoviPrep.  The instrument was then slowly withdrawn as the colon was fully examined. <<PROCEDUREIMAGES>>  FINDINGS:  The terminal ileum appeared normal.  Mild diverticulosis was found in the sigmoid colon.  This was otherwise a normal examination of the colon.   Retroflexed views in the rectum revealed no abnormalities.  The scope was then withdrawn from the cecum and the procedure completed.  COMPLICATIONS:  None  ENDOSCOPIC IMPRESSION: 1) Normal terminal ileum 2) Mild diverticulosis in the sigmoid colon 3) Otherwise normal examination  RECOMMENDATIONS: 1) High fiber diet. 2) You should continue to follow colorectal cancer screening guidelines for "routine risk" patients with a repeat colonoscopy in 10 years. There is no need for FOBT (stool) testing for at least 5 years.  Lajuan Lines. Chermaine Schnyder, MD  CC:  Jeannetta Nap, MD The Patient  n. eSIGNEDLajuan Lines. Laiklyn Pilkenton at 05/14/2011 11:25 AM  Milana Kidney, [id number]

## 2011-05-14 NOTE — Progress Notes (Signed)
Pt having PVC every fourth beat on her heart rhythm check when first accessed in the procedure room.  Dr. Hilarie Fredrickson was made aware of this pre-procedure.maw  After sedation was given the pt had occasional PVC unifocal. Maw  The pt tolerated the colonoscopy very well. maw

## 2011-05-15 ENCOUNTER — Ambulatory Visit (INDEPENDENT_AMBULATORY_CARE_PROVIDER_SITE_OTHER): Payer: Medicaid Other | Admitting: *Deleted

## 2011-05-15 ENCOUNTER — Telehealth: Payer: Self-pay | Admitting: *Deleted

## 2011-05-15 DIAGNOSIS — Z111 Encounter for screening for respiratory tuberculosis: Secondary | ICD-10-CM

## 2011-05-15 NOTE — Telephone Encounter (Signed)
No answer, message left for patient. 

## 2011-05-17 ENCOUNTER — Ambulatory Visit (INDEPENDENT_AMBULATORY_CARE_PROVIDER_SITE_OTHER): Payer: Medicaid Other | Admitting: *Deleted

## 2011-05-17 DIAGNOSIS — IMO0001 Reserved for inherently not codable concepts without codable children: Secondary | ICD-10-CM

## 2011-05-17 DIAGNOSIS — Z23 Encounter for immunization: Secondary | ICD-10-CM

## 2011-05-17 LAB — TB SKIN TEST
Induration: 0
TB Skin Test: NEGATIVE mm

## 2011-05-17 NOTE — Progress Notes (Signed)
Patient in to read PPD.  Reports 2 days ago sustained a laceration to right hand just above index finger  while washing a glass that broke.. The edges are approximated  and 1.5 cm in length .  Appears slightly swollen and patient reports it is sore to touch. Dr. Lindell Noe came in to look at hand and he does not think there is any infection at this time. Advised to keep clean and covered.  Call back is redness is noted , drainage , etc. Tdap given.

## 2011-05-29 ENCOUNTER — Telehealth: Payer: Self-pay | Admitting: Family Medicine

## 2011-05-29 NOTE — Telephone Encounter (Signed)
Message left to return call.

## 2011-05-29 NOTE — Telephone Encounter (Signed)
Patient reports chest congestion , cough with some yellow green sputum. No fever. Symptoms started 3-4 days ago. She tried Tussinex OTC and this AM when she woke up had swelling of lip and tongue.  Consulted with Dr. Doreene Nest and advised she can take plain mucinex and  not   D. Advised if not improving will need appointment.

## 2011-05-29 NOTE — Telephone Encounter (Signed)
[name] is wondering what kind of medication she can take for her cold symptoms being a diabetic.

## 2011-06-12 ENCOUNTER — Other Ambulatory Visit: Payer: Self-pay | Admitting: Family Medicine

## 2011-06-12 MED ORDER — OMEPRAZOLE 20 MG PO CPDR: 20.0000 mg | DELAYED_RELEASE_CAPSULE | Freq: Two times a day (BID) | ORAL | Status: DC

## 2011-07-18 ENCOUNTER — Encounter: Payer: Self-pay | Admitting: Family Medicine

## 2011-07-18 ENCOUNTER — Ambulatory Visit (INDEPENDENT_AMBULATORY_CARE_PROVIDER_SITE_OTHER): Payer: Medicaid Other | Admitting: Family Medicine

## 2011-07-18 DIAGNOSIS — G44209 Tension-type headache, unspecified, not intractable: Secondary | ICD-10-CM

## 2011-07-18 DIAGNOSIS — E1169 Type 2 diabetes mellitus with other specified complication: Secondary | ICD-10-CM | POA: Insufficient documentation

## 2011-07-18 DIAGNOSIS — G4710 Hypersomnia, unspecified: Secondary | ICD-10-CM

## 2011-07-18 DIAGNOSIS — E876 Hypokalemia: Secondary | ICD-10-CM

## 2011-07-18 DIAGNOSIS — E785 Hyperlipidemia, unspecified: Secondary | ICD-10-CM | POA: Insufficient documentation

## 2011-07-18 DIAGNOSIS — I10 Essential (primary) hypertension: Secondary | ICD-10-CM

## 2011-07-18 DIAGNOSIS — E119 Type 2 diabetes mellitus without complications: Secondary | ICD-10-CM

## 2011-07-18 DIAGNOSIS — F17200 Nicotine dependence, unspecified, uncomplicated: Secondary | ICD-10-CM

## 2011-07-18 DIAGNOSIS — E7800 Pure hypercholesterolemia, unspecified: Secondary | ICD-10-CM

## 2011-07-18 DIAGNOSIS — E669 Obesity, unspecified: Secondary | ICD-10-CM

## 2011-07-18 DIAGNOSIS — D259 Leiomyoma of uterus, unspecified: Secondary | ICD-10-CM

## 2011-07-18 MED ORDER — LOSARTAN POTASSIUM 25 MG PO TABS: 25.0000 mg | ORAL_TABLET | Freq: Every day | ORAL | Status: DC

## 2011-07-18 MED ORDER — HYDROCHLOROTHIAZIDE 25 MG PO TABS: 25.0000 mg | ORAL_TABLET | Freq: Every day | ORAL | Status: DC

## 2011-07-18 MED ORDER — POTASSIUM CHLORIDE ER 10 MEQ PO TBCR: 20.0000 meq | EXTENDED_RELEASE_TABLET | Freq: Two times a day (BID) | ORAL | Status: DC

## 2011-07-18 MED ORDER — CLONIDINE HCL 0.3 MG PO TABS: 0.3000 mg | ORAL_TABLET | Freq: Two times a day (BID) | ORAL | Status: DC

## 2011-07-18 MED ORDER — SIMVASTATIN 10 MG PO TABS: 10.0000 mg | ORAL_TABLET | Freq: Every day | ORAL | Status: DC

## 2011-07-18 NOTE — Assessment & Plan Note (Signed)
Sleep study pending 

## 2011-07-18 NOTE — Assessment & Plan Note (Signed)
Start simvastatin 10, recheck LFt'S in one month.

## 2011-07-18 NOTE — Patient Instructions (Addendum)
Smoking Cessation This document explains the best ways for you to quit smoking and new treatments to help. It lists new medicines that can double or triple your chances of quitting and quitting for good. It also considers ways to avoid relapses and concerns you may have about quitting, including weight gain. NICOTINE: A POWERFUL ADDICTION If you have tried to quit smoking, you know how hard it can be. It is hard because nicotine is a very addictive drug. For some people, it can be as addictive as heroin or cocaine. Usually, people make 2 or 3 tries, or more, before finally being able to quit. Each time you try to quit, you can learn about what helps and what hurts. Quitting takes hard work and a lot of effort, but you can quit smoking. QUITTING SMOKING IS ONE OF THE MOST IMPORTANT THINGS YOU WILL EVER DO.  You will live longer, feel better, and live better.   The impact on your body of quitting smoking is felt almost immediately:   Within 20 minutes, blood pressure decreases. Pulse returns to its normal level.   After 8 hours, carbon monoxide levels in the blood return to normal. Oxygen level increases.   After 24 hours, chance of heart attack starts to decrease. Breath, hair, and body stop smelling like smoke.   After 48 hours, damaged nerve endings begin to recover. Sense of taste and smell improve.   After 72 hours, the body is virtually free of nicotine. Bronchial tubes relax and breathing becomes easier.   After 2 to 12 weeks, lungs can hold more air. Exercise becomes easier and circulation improves.   Quitting will reduce your risk of having a heart attack, stroke, cancer, or lung disease:   After 1 year, the risk of coronary heart disease is cut in half.   After 5 years, the risk of stroke falls to the same as a nonsmoker.   After 10 years, the risk of lung cancer is cut in half and the risk of other cancers decreases significantly.   After 15 years, the risk of coronary heart  disease drops, usually to the level of a nonsmoker.   If you are pregnant, quitting smoking will improve your chances of having a healthy baby.   The people you live with, especially your children, will be healthier.   You will have extra money to spend on things other than cigarettes.  FIVE KEYS TO QUITTING Studies have shown that these 5 steps will help you quit smoking and quit for good. You have the best chances of quitting if you use them together: 1. Get ready.  2. Get support and encouragement.  3. Learn new skills and behaviors.  4. Get medicine to reduce your nicotine addiction and use it correctly.  5. Be prepared for relapse or difficult situations. Be determined to continue trying to quit, even if you do not succeed at first.  1. GET READY  Set a quit date.   Change your environment.   Get rid of ALL cigarettes, ashtrays, matches, and lighters in your home, car, and place of work.   Do not let people smoke in your home.   Review your past attempts to quit. Think about what worked and what did not.   Once you quit, do not smoke. NOT EVEN A PUFF!  2. GET SUPPORT AND ENCOURAGEMENT Studies have shown that you have a better chance of being successful if you have help. You can get support in many ways.  Tell   your family, friends, and coworkers that you are going to quit and need their support. Ask them not to smoke around you.   Talk to your caregivers (doctor, dentist, nurse, pharmacist, psychologist, and/or smoking counselor).   Get individual, group, or telephone counseling and support. The more counseling you have, the better your chances are of quitting. Programs are available at local hospitals and health centers. Call your local health department for information about programs in your area.   Spiritual beliefs and practices may help some smokers quit.   Quit meters are small computer programs online or downloadable that keep track of quit statistics, such as amount  of "quit-time," cigarettes not smoked, and money saved.   Many smokers find one or more of the many self-help books available useful in helping them quit and stay off tobacco.  3. LEARN NEW SKILLS AND BEHAVIORS  Try to distract yourself from urges to smoke. Talk to someone, go for a walk, or occupy your time with a task.   When you first try to quit, change your routine. Take a different route to work. Drink tea instead of coffee. Eat breakfast in a different place.   Do something to reduce your stress. Take a hot bath, exercise, or read a book.   Plan something enjoyable to do every day. Reward yourself for not smoking.   Explore interactive web-based programs that specialize in helping you quit.  4. GET MEDICINE AND USE IT CORRECTLY Medicines can help you stop smoking and decrease the urge to smoke. Combining medicine with the above behavioral methods and support can quadruple your chances of successfully quitting smoking. The [place] Food and Drug Administration (FDA) has approved 7 medicines to help you quit smoking. These medicines fall into 3 categories.  Nicotine replacement therapy (delivers nicotine to your body without the negative effects and risks of smoking):   Nicotine gum: Available over-the-counter.   Nicotine lozenges: Available over-the-counter.   Nicotine inhaler: Available by prescription.   Nicotine nasal spray: Available by prescription.   Nicotine skin patches (transdermal): Available by prescription and over-the-counter.   Antidepressant medicine (helps people abstain from smoking, but how this works is unknown):   Bupropion sustained-release (SR) tablets: Available by prescription.   Nicotinic receptor partial agonist (simulates the effect of nicotine in your brain):   Varenicline tartrate tablets: Available by prescription.   Ask your caregiver for advice about which medicines to use and how to use them. Carefully read the information on the package.    Everyone who is trying to quit may benefit from using a medicine. If you are pregnant or trying to become pregnant, nursing an infant, you are under age 18, or you smoke fewer than 10 cigarettes per day, talk to your caregiver before taking any nicotine replacement medicines.   You should stop using a nicotine replacement product and call your caregiver if you experience nausea, dizziness, weakness, vomiting, fast or irregular heartbeat, mouth problems with the lozenge or gum, or redness or swelling of the skin around the patch that does not go away.   Do not use any other product containing nicotine while using a nicotine replacement product.   Talk to your caregiver before using these products if you have diabetes, heart disease, asthma, stomach ulcers, you had a recent heart attack, you have high blood pressure that is not controlled with medicine, a history of irregular heartbeat, or you have been prescribed medicine to help you quit smoking.  5. BE PREPARED FOR RELAPSE OR   DIFFICULT SITUATIONS  Most relapses occur within the first 3 months after quitting. Do not be discouraged if you start smoking again. Remember, most people try several times before they finally quit.   You may have symptoms of withdrawal because your body is used to nicotine. You may crave cigarettes, be irritable, feel very hungry, cough often, get headaches, or have difficulty concentrating.   The withdrawal symptoms are only temporary. They are strongest when you first quit, but they will go away within 10 to 14 days.  Here are some difficult situations to watch for:  Alcohol. Avoid drinking alcohol. Drinking lowers your chances of successfully quitting.   Caffeine. Try to reduce the amount of caffeine you consume. It also lowers your chances of successfully quitting.   Other smokers. Being around smoking can make you want to smoke. Avoid smokers.   Weight gain. Many smokers will gain weight when they quit, usually  less than 10 pounds. Eat a healthy diet and stay active. Do not let weight gain distract you from your main goal, quitting smoking. Some medicines that help you quit smoking may also help delay weight gain. You can always lose the weight gained after you quit.   Bad mood or depression. There are a lot of ways to improve your mood other than smoking.  If you are having problems with any of these situations, talk to your caregiver. SPECIAL SITUATIONS AND CONDITIONS Studies suggest that everyone can quit smoking. Your situation or condition can give you a special reason to quit.  Pregnant women/new mothers: By quitting, you protect your baby's health and your own.   Hospitalized patients: By quitting, you reduce health problems and help healing.   Heart attack patients: By quitting, you reduce your risk of a second heart attack.   Lung, head, and neck cancer patients: By quitting, you reduce your chance of a second cancer.   Parents of children and adolescents: By quitting, you protect your children from illnesses caused by secondhand smoke.  QUESTIONS TO THINK ABOUT Think about the following questions before you try to stop smoking. You may want to talk about your answers with your caregiver.  Why do you want to quit?   If you tried to quit in the past, what helped and what did not?   What will be the most difficult situations for you after you quit? How will you plan to handle them?   Who can help you through the tough times? Your family? Friends? Caregiver?   What pleasures do you get from smoking? What ways can you still get pleasure if you quit?  Here are some questions to ask your caregiver:  How can you help me to be successful at quitting?   What medicine do you think would be best for me and how should I take it?   What should I do if I need more help?   What is smoking withdrawal like? How can I get information on withdrawal?  Quitting takes hard work and a lot of effort,  but you can quit smoking. FOR MORE INFORMATION  Smokefree.gov (Inrails.tn) provides free, accurate, evidence-based information and professional assistance to help support the immediate and long-term needs of people trying to quit smoking. Document Released: 04/16/2001 Document Revised: 04/11/2011 Document Reviewed: 02/06/2009 [facility] Patient Information 2012 [place]. Sleep Apnea Sleep apnea is a common disorder. The main problem of this disorder is excessive daytime sleepiness and compromised quality of life. This may include social and emotional problems. There  are two types of sleep apnea.  Obstructive sleep apnea is when breathing stops due to a blocked airway.   Central sleep apnea is a malfunction of the brain's normal signal to breathe.  SYMPTOMS  Restless sleep.   Falling asleep while driving and/or during the day.   Loss of energy.   Irritability.   Mood or behavior changes.   Loud, heavy snoring.   Morning headaches.   Trouble concentrating.   Forgetfulness.   Anxiety or depression.   Decreased interest in sex.  Not all people with sleep apnea have all of these symptoms. However, people who have a few of these symptoms should visit their caregiver for an evaluation. Problems related to untreated sleep apnea include:  High blood pressure (hypertension).   Coronary artery disease.   Impotence.   Cognitive dysfunction.   Memory loss.  TREATMENT  For mild cases, treatment may include avoiding sleeping on one's back.   For people with nasal congestion, a decongestant may be prescribed.   Patients with obstructive and central apnea should avoid depressants. This includes alcohol, sedatives and narcotics. Weight loss and diet control are encouraged for overweight patients.   Many serious cases of obstructive sleep apnea can be relieved by a treatment called nasal continuous positive airway pressure (nasal CPAP). Nasal CPAP uses a mask-like  device and pump that work together to keep the airway open. The pump delivers air pressure during each breath.   Surgery may help some patients by stopping or reducing the narrowing of the airway due to anatomical defects.  PROGNOSIS  Removing the obstruction usually reverses hypertension and cardiac problems. Untreated, sleep apnea sufferers have a tendency to fall asleep during the day. This is can result in serious accident or loss of ones job. RESEARCH Sleep apnea is currently one of the most active areas of sleep research.  Document Released: 04/12/2002 Document Revised: 04/11/2011 Document Reviewed: 08/08/2005 [facility] Patient Information 2012 [place].

## 2011-07-18 NOTE — Assessment & Plan Note (Signed)
BP looks good today. 

## 2011-07-18 NOTE — Assessment & Plan Note (Signed)
Hgb A1C is 7.0, looks good.

## 2011-07-18 NOTE — Progress Notes (Signed)
  Subjective:    Patient ID: [name], female    DOB: [birth year], 51 y.o.   MRN: [id number]  HPI Elevated cholesterol for DM.  Will begin treatment. Hypersomnolence, does snore at night.  She has lost 2 jobs for falling asleep on the job. Knee pain   Review of Systems  Constitutional: Negative for fever, chills and activity change.  HENT: Negative for rhinorrhea.   Respiratory: Negative for chest tightness and shortness of breath.   Gastrointestinal: Negative for nausea, vomiting and diarrhea.  Musculoskeletal: Positive for joint swelling and arthralgias.       Objective:   Physical Exam  Vitals reviewed. Constitutional: She is oriented to person, place, and time. She appears well-developed and well-nourished.  HENT:  Head: Normocephalic and atraumatic.  Neck: Neck supple.  Cardiovascular: Normal rate.   No murmur heard. Pulmonary/Chest: Effort normal and breath sounds normal.  Abdominal: Soft. There is no tenderness.  Musculoskeletal: Normal range of motion. She exhibits edema (right knee) and tenderness (right knee).  Neurological: She is alert and oriented to person, place, and time.          Assessment & Plan:   1. Essential hypertension  hydrochlorothiazide (HYDRODIURIL) 25 MG tablet  2. Essential hypertension, benign  hydrochlorothiazide (HYDRODIURIL) 25 MG tablet, losartan (COZAAR) 25 MG tablet, cloNIDine (CATAPRES) 0.3 MG tablet  3. Hypertension  cloNIDine (CATAPRES) 0.3 MG tablet  4. Hypopotassemia  potassium chloride (K-DUR) 10 MEQ tablet  5. Hypersomnolent  Nocturnal polysomnography (NPSG)  6. Hypercholesteremia  simvastatin (ZOCOR) 10 MG tablet  7. DIABETES MELLITUS, NONINSULIN DEPENDENT (NIDDM)    8. LEIOMYOMA, UTERUS    9. OBESITY, NOS    10. Tension headache    11. TOBACCO DEPENDENCE    see AVS, smoking cessation info given, sleep study. Follow up in 1 month and check LFT's.

## 2011-07-18 NOTE — Assessment & Plan Note (Signed)
smoking cessation discussed.

## 2011-07-22 ENCOUNTER — Telehealth: Payer: Self-pay | Admitting: Family Medicine

## 2011-07-22 MED ORDER — TRAMADOL HCL 50 MG PO TABS: 50.0000 mg | ORAL_TABLET | Freq: Four times a day (QID) | ORAL | Status: DC | PRN

## 2011-07-22 NOTE — Telephone Encounter (Signed)
Patient was in on 3/14 for her knee pain and would like Dr. Kennon Rounds to call ing something because Tylenol is not working.

## 2011-07-22 NOTE — Telephone Encounter (Signed)
Pt informed and agreeable. [name] Dawn  

## 2011-07-22 NOTE — Telephone Encounter (Signed)
Trial of Ultram, has NSAIDS (Naprosyn and Tylenol)  May take this in addition to those.

## 2011-08-07 ENCOUNTER — Telehealth: Payer: Self-pay | Admitting: Family Medicine

## 2011-08-07 NOTE — Telephone Encounter (Signed)
Pt has been taking simvastatin for 3 weeks and it's causing her to have stomach pains - wants to know what she should do

## 2011-08-07 NOTE — Telephone Encounter (Signed)
Returned call to patient.  Started simvastatin and potassium 3 weeks ago.  Stomach and back started hurting on Sunday evening.  Thinks it may due to the simvastatin.  States pain is sharp and intermittent and is 4/10.  "Feels nauseated sometimes" and wants to know if she should stop taking the med.  Offered patient a work-in appt and she declined.  States she will continue her meds and feels she can wait until her appt on Monday with Dr. Kennon Rounds.  Patient will call back for a sooner appt if pain worsens.  Will route note to Dr. Kennon Rounds.  Nolene Ebbs, RN

## 2011-08-12 ENCOUNTER — Ambulatory Visit: Payer: Medicaid Other | Admitting: Family Medicine

## 2011-08-14 ENCOUNTER — Ambulatory Visit (HOSPITAL_BASED_OUTPATIENT_CLINIC_OR_DEPARTMENT_OTHER): Payer: Medicaid Other | Attending: Family Medicine | Admitting: Radiology

## 2011-08-14 VITALS — Ht 69.0 in | Wt 290.0 lb

## 2011-08-14 DIAGNOSIS — G4733 Obstructive sleep apnea (adult) (pediatric): Secondary | ICD-10-CM | POA: Insufficient documentation

## 2011-08-14 DIAGNOSIS — G4710 Hypersomnia, unspecified: Secondary | ICD-10-CM

## 2011-08-14 DIAGNOSIS — I4949 Other premature depolarization: Secondary | ICD-10-CM | POA: Insufficient documentation

## 2011-08-18 DIAGNOSIS — I4949 Other premature depolarization: Secondary | ICD-10-CM

## 2011-08-18 DIAGNOSIS — G4733 Obstructive sleep apnea (adult) (pediatric): Secondary | ICD-10-CM

## 2011-08-18 NOTE — Procedures (Signed)
[name], [name]                  ACCOUNT NO.:  [id number]  MEDICAL RECORD NO.:  [id number]          PATIENT TYPE:  OUT  LOCATION:  SLEEP CENTER                 FACILITY:  [facility]  PHYSICIAN:  Beatriz Settles D. Annamaria Boots, MD, FCCP, FACPDATE OF BIRTH:  1960/11/02  DATE OF STUDY:  08/14/2011                           NOCTURNAL POLYSOMNOGRAM  REFERRING PHYSICIAN:  [name], M.D.  REFERRING PHYSICIAN:  Standley Dakins. Kennon Rounds, MD  INDICATION FOR STUDY:  Hypersomnia with sleep apnea.  EPWORTH SLEEPINESS SCORE:  18/24.  BMI 42.8, weight 290 pounds, height 69 inches, neck 14.5 inches.  MEDICATIONS:  Home medications are charted and reviewed.  SLEEP ARCHITECTURE:  Total sleep time 258.5 minutes with sleep efficiency 62.3%.  Stage I was 22.1%, stage II 71.2%, stage III absent, REM 6.8% of total sleep time.  Sleep latency 12 minutes, REM latency 135 minutes, awake after sleep onset 139 minutes, arousal index 36.9.  Bedtime medication:  None.  Sleep was marked by frequent brief and more prolonged awakenings throughout the night, nonspecific and suggesting an insomnia pattern.  RESPIRATORY DATA:  Apnea-hypopnea index (AHI) 16.9 per hour.  A total of 73 events were scored including 7 obstructive apneas and 66 hypopneas. Events were seen in all sleep positions.  REM AHI 20.6 per hour.  This is a diagnostic NPSG protocol study as ordered.  CPAP was not tested.  OXYGEN DATA:  Mild to moderate snoring with oxygen desaturation to a nadir of 85% and mean oxygen saturation through the study of 95.7% on room air.  CARDIAC DATA:  Sinus rhythm with frequent PVCs, multifocal and couplets.  MOVEMENT-PARASOMNIA:  No significant movement disturbance.  Bathroom x2.  IMPRESSIONS-RECOMMENDATION: 1. Moderate obstructive sleep apnea/hypopnea syndrome, apnea/hypopnea     index 16.9 per hour with events in all sleep positions.  Mild to     moderate snoring with oxygen desaturation to a nadir of 85% and     mean  oxygen saturation through the study of 95.7% on room air. 2. Sleep architecture was significant for frequent brief and more     prolonged spontaneous awakening throughout the night suggesting an     insomnia pattern, beyond the effects of respiratory sleep     disturbance. 3. This study was performed as a diagnostic nocturnal polysomnogram     protocol as ordered.  Consider return for dedicated CPAP titration     study if appropriate.  It may be helpful to bring a sleep     medication to improve sleep consolidation. 4. Frequent premature ventricular contractions including multifocal     events and couplets were noted.     Hardy Harcum D. Annamaria Boots, MD, [facility], FACP Diplomate, [place] Board of Sleep Medicine    CDY/MEDQ  D:  08/18/2011 09:11:55  T:  08/18/2011 13:14:13  Job:  [id number]

## 2011-08-19 ENCOUNTER — Telehealth: Payer: Self-pay | Admitting: *Deleted

## 2011-08-19 NOTE — Telephone Encounter (Signed)
Refill request received by fax for Amlodipine 10 mg and Carvedilol 25 mg . Will forward to Dr. Kennon Rounds.

## 2011-08-20 MED ORDER — CARVEDILOL 25 MG PO TABS: 25.0000 mg | ORAL_TABLET | Freq: Two times a day (BID) | ORAL | Status: DC

## 2011-08-20 MED ORDER — AMLODIPINE BESYLATE 10 MG PO TABS: 10.0000 mg | ORAL_TABLET | Freq: Every day | ORAL | Status: DC

## 2011-08-20 NOTE — Telephone Encounter (Signed)
Patient notified

## 2011-08-28 ENCOUNTER — Ambulatory Visit: Payer: Medicaid Other | Admitting: Family Medicine

## 2011-10-16 ENCOUNTER — Other Ambulatory Visit: Payer: Self-pay | Admitting: *Deleted

## 2011-10-17 MED ORDER — PROMETHAZINE HCL 25 MG PO TABS: 12.5000 mg | ORAL_TABLET | Freq: Four times a day (QID) | ORAL | Status: DC

## 2011-11-01 ENCOUNTER — Other Ambulatory Visit: Payer: Self-pay | Admitting: *Deleted

## 2011-11-01 MED ORDER — OMEPRAZOLE 20 MG PO CPDR: 20.0000 mg | DELAYED_RELEASE_CAPSULE | Freq: Two times a day (BID) | ORAL | Status: DC

## 2011-11-01 NOTE — Telephone Encounter (Signed)
Pharmacy requesting refill of medication, prilosec.

## 2011-11-11 ENCOUNTER — Other Ambulatory Visit: Payer: Self-pay | Admitting: *Deleted

## 2011-11-11 MED ORDER — TRAMADOL HCL 50 MG PO TABS: 50.0000 mg | ORAL_TABLET | Freq: Four times a day (QID) | ORAL | Status: DC | PRN

## 2011-11-11 MED ORDER — METFORMIN HCL 500 MG PO TABS: 500.0000 mg | ORAL_TABLET | Freq: Two times a day (BID) | ORAL | Status: DC

## 2011-12-02 ENCOUNTER — Telehealth: Payer: Self-pay | Admitting: Family Medicine

## 2011-12-02 DIAGNOSIS — E119 Type 2 diabetes mellitus without complications: Secondary | ICD-10-CM

## 2011-12-02 MED ORDER — FREESTYLE LANCETS MISC: Status: DC

## 2011-12-02 NOTE — Telephone Encounter (Signed)
needs lancets in order to check her BS [facility] [place]

## 2011-12-16 ENCOUNTER — Other Ambulatory Visit: Payer: Self-pay | Admitting: *Deleted

## 2011-12-16 DIAGNOSIS — I10 Essential (primary) hypertension: Secondary | ICD-10-CM

## 2011-12-17 ENCOUNTER — Other Ambulatory Visit: Payer: Self-pay | Admitting: *Deleted

## 2011-12-17 DIAGNOSIS — I10 Essential (primary) hypertension: Secondary | ICD-10-CM

## 2011-12-17 MED ORDER — CLONIDINE HCL 0.3 MG PO TABS: 0.3000 mg | ORAL_TABLET | Freq: Two times a day (BID) | ORAL | Status: DC

## 2011-12-17 MED ORDER — CARVEDILOL 25 MG PO TABS: 25.0000 mg | ORAL_TABLET | Freq: Two times a day (BID) | ORAL | Status: DC

## 2011-12-17 MED ORDER — HYDROCHLOROTHIAZIDE 25 MG PO TABS: 25.0000 mg | ORAL_TABLET | Freq: Every day | ORAL | Status: DC

## 2011-12-17 MED ORDER — AMLODIPINE BESYLATE 10 MG PO TABS: 10.0000 mg | ORAL_TABLET | Freq: Every day | ORAL | Status: DC

## 2011-12-17 NOTE — Telephone Encounter (Signed)
Rx sent to pharmacy electronically. Advised patient to call for appointment before next refill .

## 2011-12-17 NOTE — Telephone Encounter (Signed)
Patient is calling to check the status of these refills.  She said she had gone to [facility] on Friday to request these refills and we just received them today.  She wants Dr. Kennon Rounds to know that she has now been out for a week and would really appreciate if these can be refilled today.

## 2012-01-07 ENCOUNTER — Ambulatory Visit: Payer: Medicaid Other | Admitting: Family Medicine

## 2012-01-16 ENCOUNTER — Ambulatory Visit (INDEPENDENT_AMBULATORY_CARE_PROVIDER_SITE_OTHER): Payer: Medicaid Other | Admitting: Family Medicine

## 2012-01-16 ENCOUNTER — Encounter: Payer: Self-pay | Admitting: Family Medicine

## 2012-01-16 VITALS — BP 131/84 | HR 80 | Temp 98.2°F | Ht 69.0 in | Wt 291.0 lb

## 2012-01-16 DIAGNOSIS — I10 Essential (primary) hypertension: Secondary | ICD-10-CM

## 2012-01-16 DIAGNOSIS — E876 Hypokalemia: Secondary | ICD-10-CM

## 2012-01-16 DIAGNOSIS — G4710 Hypersomnia, unspecified: Secondary | ICD-10-CM

## 2012-01-16 DIAGNOSIS — M170 Bilateral primary osteoarthritis of knee: Secondary | ICD-10-CM | POA: Insufficient documentation

## 2012-01-16 DIAGNOSIS — K219 Gastro-esophageal reflux disease without esophagitis: Secondary | ICD-10-CM | POA: Insufficient documentation

## 2012-01-16 DIAGNOSIS — G4733 Obstructive sleep apnea (adult) (pediatric): Secondary | ICD-10-CM

## 2012-01-16 DIAGNOSIS — Z87891 Personal history of nicotine dependence: Secondary | ICD-10-CM

## 2012-01-16 DIAGNOSIS — R110 Nausea: Secondary | ICD-10-CM

## 2012-01-16 DIAGNOSIS — E119 Type 2 diabetes mellitus without complications: Secondary | ICD-10-CM

## 2012-01-16 DIAGNOSIS — M1710 Unilateral primary osteoarthritis, unspecified knee: Secondary | ICD-10-CM

## 2012-01-16 DIAGNOSIS — E7800 Pure hypercholesterolemia, unspecified: Secondary | ICD-10-CM

## 2012-01-16 MED ORDER — CARVEDILOL 25 MG PO TABS: 25.0000 mg | ORAL_TABLET | Freq: Two times a day (BID) | ORAL | Status: DC

## 2012-01-16 MED ORDER — HYDROCHLOROTHIAZIDE 25 MG PO TABS: 25.0000 mg | ORAL_TABLET | Freq: Every day | ORAL | Status: DC

## 2012-01-16 MED ORDER — DOXEPIN HCL 10 MG PO CAPS: 10.0000 mg | ORAL_CAPSULE | Freq: Every day | ORAL | Status: DC

## 2012-01-16 MED ORDER — METFORMIN HCL 500 MG PO TABS: 500.0000 mg | ORAL_TABLET | Freq: Two times a day (BID) | ORAL | Status: DC

## 2012-01-16 MED ORDER — TRAMADOL HCL 50 MG PO TABS: 50.0000 mg | ORAL_TABLET | Freq: Four times a day (QID) | ORAL | Status: DC | PRN

## 2012-01-16 MED ORDER — SIMVASTATIN 10 MG PO TABS: 10.0000 mg | ORAL_TABLET | Freq: Every day | ORAL | Status: DC

## 2012-01-16 MED ORDER — POTASSIUM CHLORIDE CRYS ER 20 MEQ PO TBCR: 20.0000 meq | EXTENDED_RELEASE_TABLET | Freq: Two times a day (BID) | ORAL | Status: DC

## 2012-01-16 MED ORDER — LOSARTAN POTASSIUM 25 MG PO TABS: 25.0000 mg | ORAL_TABLET | Freq: Every day | ORAL | Status: DC

## 2012-01-16 MED ORDER — ASPIRIN 325 MG PO TABS: 325.0000 mg | ORAL_TABLET | Freq: Every day | ORAL | Status: DC

## 2012-01-16 MED ORDER — OMEPRAZOLE 20 MG PO CPDR: 20.0000 mg | DELAYED_RELEASE_CAPSULE | Freq: Two times a day (BID) | ORAL | Status: DC

## 2012-01-16 MED ORDER — PROMETHAZINE HCL 25 MG PO TABS: 12.5000 mg | ORAL_TABLET | Freq: Four times a day (QID) | ORAL | Status: DC

## 2012-01-16 MED ORDER — CLONIDINE HCL 0.3 MG PO TABS: 0.3000 mg | ORAL_TABLET | Freq: Two times a day (BID) | ORAL | Status: DC

## 2012-01-16 MED ORDER — NAPROXEN 500 MG PO TABS: 500.0000 mg | ORAL_TABLET | Freq: Two times a day (BID) | ORAL | Status: DC

## 2012-01-16 MED ORDER — DOXEPIN HCL 3 MG PO TABS: 3.0000 mg | ORAL_TABLET | Freq: Every day | ORAL | Status: DC

## 2012-01-16 MED ORDER — AMLODIPINE BESYLATE 10 MG PO TABS: 10.0000 mg | ORAL_TABLET | Freq: Every day | ORAL | Status: DC

## 2012-01-16 NOTE — Assessment & Plan Note (Signed)
On meds now--will recheck levels next visit

## 2012-01-16 NOTE — Patient Instructions (Addendum)
Congratulations on quitting smoking!  I am so proud of your progress.  Keep up the good work.   We will schedule your sleep study.  I have refilled your medicines and have added Doxepin to be taken at bedtime to help you sleep.  Diabetes and Exercise Regular exercise is important and can help:   Control blood glucose (sugar).   Decrease blood pressure.    Control blood lipids (cholesterol, triglycerides).   Improve overall health.  BENEFITS FROM EXERCISE  Improved fitness.   Improved flexibility.   Improved endurance.   Increased bone density.   Weight control.   Increased muscle strength.   Decreased body fat.   Improvement of the body's use of insulin, a hormone.   Increased insulin sensitivity.   Reduction of insulin needs.   Reduced stress and tension.   Helps you feel better.  People with diabetes who add exercise to their lifestyle gain additional benefits, including:  Weight loss.   Reduced appetite.   Improvement of the body's use of blood glucose.   Decreased risk factors for heart disease:   Lowering of cholesterol and triglycerides.   Raising the level of good cholesterol (high-density lipoproteins, HDL).   Lowering blood sugar.   Decreased blood pressure.  TYPE 1 DIABETES AND EXERCISE  Exercise will usually lower your blood glucose.   If blood glucose is greater than 240 mg/dl, check urine ketones. If ketones are present, do not exercise.   Location of the insulin injection sites may need to be adjusted with exercise. Avoid injecting insulin into areas of the body that will be exercised. For example, avoid injecting insulin into:   The arms when playing tennis.   The legs when jogging. For more information, discuss this with your caregiver.   Keep a record of:   Food intake.   Type and amount of exercise.   Expected peak times of insulin action.   Blood glucose levels.  Do this before, during, and after exercise. Review your  records with your caregiver. This will help you to develop guidelines for adjusting food intake and insulin amounts.  TYPE 2 DIABETES AND EXERCISE  Regular physical activity can help control blood glucose.   Exercise is important because it may:   Increase the body's sensitivity to insulin.   Improve blood glucose control.   Exercise reduces the risk of heart disease. It decreases serum cholesterol and triglycerides. It also lowers blood pressure.   Those who take insulin or oral hypoglycemic agents should watch for signs of hypoglycemia. These signs include dizziness, shaking, sweating, chills, and confusion.   Body water is lost during exercise. It must be replaced. This will help to avoid loss of body fluids (dehydration) or heat stroke.  Be sure to talk to your caregiver before starting an exercise program to make sure it is safe for you. Remember, any activity is better than none.  Document Released: 07/13/2003 Document Revised: 04/11/2011 Document Reviewed: 10/27/2008 [facility] Patient Information 2012 [place].Calorie Counting Diet A calorie counting diet requires you to eat the number of calories that are right for you in a day. Calories are the measurement of how much energy you get from the food you eat. Eating the right amount of calories is important for staying at a healthy weight. If you eat too many calories, your body will store them as fat and you may gain weight. If you eat too few calories, you may lose weight. Counting the number of calories you eat during  a day will help you know if you are eating the right amount. A Registered Dietitian can determine how many calories you need in a day. The amount of calories needed varies from person to person. If your goal is to lose weight, you will need to eat fewer calories. Losing weight can benefit you if you are overweight or have health problems such as heart disease, high blood pressure, or diabetes. If your goal is to gain  weight, you will need to eat more calories. Gaining weight may be necessary if you have a certain health problem that causes your body to need more energy. TIPS Whether you are increasing or decreasing the number of calories you eat during a day, it may be hard to get used to changes in what you eat and drink. The following are tips to help you keep track of the number of calories you eat.  Measure foods at home with measuring cups. This helps you know the amount of food and number of calories you are eating.   Restaurants often serve food in amounts that are larger than 1 serving. While eating out, estimate how many servings of a food you are given. For example, a serving of cooked rice is  cup or about the size of half of a fist. Knowing serving sizes will help you be aware of how much food you are eating at restaurants.   Ask for smaller portion sizes or child-size portions at restaurants.   Plan to eat half of a meal at a restaurant. Take the rest home or share the other half with a friend.   Read the Nutrition Facts panel on food labels for calorie content and serving size. You can find out how many servings are in a package, the size of a serving, and the number of calories each serving has.   For example, a package might contain 3 cookies. The Nutrition Facts panel on that package says that 1 serving is 1 cookie. Below that, it will say there are 3 servings in the container. The calories section of the Nutrition Facts label says there are 90 calories. This means there are 90 calories in 1 cookie (1 serving). If you eat 1 cookie you have eaten 90 calories. If you eat all 3 cookies, you have eaten 270 calories (3 servings x 90 calories = 270 calories).  The list below tells you how big or small some common portion sizes are.  1 oz.........4 stacked dice.   3 oz........Marland KitchenDeck of cards.   1 tsp.......Marland KitchenTip of little finger.   1 tbs......Marland KitchenMarland KitchenThumb.   2 tbs.......Marland KitchenGolf ball.     cup......Marland KitchenHalf of a fist.   1 cup.......Marland KitchenA fist.  KEEP A FOOD LOG Write down every food item you eat, the amount you eat, and the number of calories in each food you eat during the day. At the end of the day, you can add up the total number of calories you have eaten. It may help to keep a list like the one below. Find out the calorie information by reading the Nutrition Facts panel on food labels. Breakfast  Bran cereal (1 cup, 110 calories).   Fat-free milk ( cup, 45 calories).  Snack  Apple (1 medium, 80 calories).  Lunch  Spinach (1 cup, 20 calories).   Tomato ( medium, 20 calories).   Chicken breast strips (3 oz, 165 calories).   Shredded cheddar cheese ( cup, 110 calories).   Light [place] dressing (2 tbs, 60 calories).  Whole-wheat bread (1 slice, 80 calories).   Tub margarine (1 tsp, 35 calories).   Vegetable soup (1 cup, 160 calories).  Dinner  Pork chop (3 oz, 190 calories).   Brown rice (1 cup, 215 calories).   Steamed broccoli ( cup, 20 calories).   Strawberries (1  cup, 65 calories).   Whipped cream (1 tbs, 50 calories).  Daily Calorie Total: [id number] Document Released: 04/22/2005 Document Revised: 04/11/2011 Document Reviewed: 10/17/2006 [facility] Patient Information 2012 [place], [place].

## 2012-01-16 NOTE — Assessment & Plan Note (Signed)
Good control today

## 2012-01-16 NOTE — Assessment & Plan Note (Signed)
Recently quit--encourage continued cessation

## 2012-01-16 NOTE — Assessment & Plan Note (Signed)
CPAP titration

## 2012-01-16 NOTE — Assessment & Plan Note (Signed)
Hgb A1C is 9.2--readjust diet

## 2012-01-16 NOTE — Addendum Note (Signed)
Addended by: Donnamae Jude on: 01/16/2012 03:39 PM   Modules accepted: Orders

## 2012-01-16 NOTE — Progress Notes (Signed)
  Subjective:    Patient ID: [name], female    DOB: 1960-12-20, 51 y.o.   MRN: [id number]  HPI Has quit smoking.  Doing juices, + one meal/day.  Reports BS 130-145.  Feeling better.  Is walking every morning.  Sometimes in the evening also.  Eating more fruits. HgbA1C is up today.  Discussed diet.  She started on Zocor 10 mg after last visit.  She had a sleep study that showed insomnia and moderate sleep apnea.  She needs CPAP testing and they recommended something for sleep.   Review of Systems  Constitutional: Negative for fever and chills.  HENT: Negative for congestion and sneezing.   Respiratory: Negative for chest tightness and shortness of breath.   Cardiovascular: Negative for chest pain.  Gastrointestinal: Positive for nausea. Negative for vomiting and abdominal pain.  Genitourinary: Negative for dysuria and frequency.  Musculoskeletal: Positive for arthralgias (knee pain-bilaterally).  Neurological: Negative for numbness and headaches.       Objective:   Physical Exam  Vitals reviewed. Constitutional: She is oriented to person, place, and time. She appears well-developed and well-nourished.  HENT:  Head: Normocephalic and atraumatic.  Eyes: No scleral icterus.  Neck: Neck supple.  Cardiovascular: Normal rate and regular rhythm.  Exam reveals no gallop.   Pulses:      Radial pulses are 2+ on the right side, and 2+ on the left side.       Dorsalis pedis pulses are 1+ on the right side, and 1+ on the left side.  Pulmonary/Chest: Effort normal and breath sounds normal.  Abdominal: Soft. She exhibits no distension. There is no tenderness.  Musculoskeletal: Normal range of motion.  Neurological: She is alert and oriented to person, place, and time. No sensory deficit.  Skin: Skin is warm and dry.  Psychiatric: She has a normal mood and affect.          Assessment & Plan:

## 2012-01-18 ENCOUNTER — Inpatient Hospital Stay (HOSPITAL_COMMUNITY): Payer: Medicaid Other

## 2012-01-18 ENCOUNTER — Emergency Department (HOSPITAL_COMMUNITY): Payer: Medicaid Other

## 2012-01-18 ENCOUNTER — Other Ambulatory Visit: Payer: Self-pay

## 2012-01-18 ENCOUNTER — Inpatient Hospital Stay (HOSPITAL_COMMUNITY)
Admission: EM | Admit: 2012-01-18 | Discharge: 2012-01-21 | DRG: 305 | Disposition: A | Discharge status: Home / Self Care | Payer: Medicaid Other | Attending: Internal Medicine | Admitting: Internal Medicine

## 2012-01-18 ENCOUNTER — Encounter (HOSPITAL_COMMUNITY): Payer: Self-pay | Admitting: Emergency Medicine

## 2012-01-18 DIAGNOSIS — E1169 Type 2 diabetes mellitus with other specified complication: Secondary | ICD-10-CM | POA: Diagnosis present

## 2012-01-18 DIAGNOSIS — R197 Diarrhea, unspecified: Secondary | ICD-10-CM | POA: Diagnosis present

## 2012-01-18 DIAGNOSIS — E119 Type 2 diabetes mellitus without complications: Secondary | ICD-10-CM | POA: Diagnosis present

## 2012-01-18 DIAGNOSIS — K219 Gastro-esophageal reflux disease without esophagitis: Secondary | ICD-10-CM | POA: Diagnosis present

## 2012-01-18 DIAGNOSIS — R110 Nausea: Secondary | ICD-10-CM

## 2012-01-18 DIAGNOSIS — I10 Essential (primary) hypertension: Principal | ICD-10-CM | POA: Diagnosis present

## 2012-01-18 DIAGNOSIS — R51 Headache: Secondary | ICD-10-CM | POA: Diagnosis present

## 2012-01-18 DIAGNOSIS — E7800 Pure hypercholesterolemia, unspecified: Secondary | ICD-10-CM | POA: Diagnosis present

## 2012-01-18 DIAGNOSIS — G4733 Obstructive sleep apnea (adult) (pediatric): Secondary | ICD-10-CM

## 2012-01-18 DIAGNOSIS — R079 Chest pain, unspecified: Secondary | ICD-10-CM

## 2012-01-18 DIAGNOSIS — E785 Hyperlipidemia, unspecified: Secondary | ICD-10-CM | POA: Diagnosis present

## 2012-01-18 DIAGNOSIS — E1065 Type 1 diabetes mellitus with hyperglycemia: Secondary | ICD-10-CM | POA: Diagnosis present

## 2012-01-18 DIAGNOSIS — D259 Leiomyoma of uterus, unspecified: Secondary | ICD-10-CM

## 2012-01-18 DIAGNOSIS — I160 Hypertensive urgency: Secondary | ICD-10-CM

## 2012-01-18 DIAGNOSIS — R112 Nausea with vomiting, unspecified: Secondary | ICD-10-CM | POA: Diagnosis present

## 2012-01-18 DIAGNOSIS — R1110 Vomiting, unspecified: Secondary | ICD-10-CM

## 2012-01-18 DIAGNOSIS — G44209 Tension-type headache, unspecified, not intractable: Secondary | ICD-10-CM

## 2012-01-18 DIAGNOSIS — R1012 Left upper quadrant pain: Secondary | ICD-10-CM | POA: Diagnosis present

## 2012-01-18 DIAGNOSIS — Z6841 Body Mass Index (BMI) 40.0 and over, adult: Secondary | ICD-10-CM

## 2012-01-18 DIAGNOSIS — Z87891 Personal history of nicotine dependence: Secondary | ICD-10-CM

## 2012-01-18 DIAGNOSIS — E876 Hypokalemia: Secondary | ICD-10-CM | POA: Diagnosis present

## 2012-01-18 DIAGNOSIS — G4710 Hypersomnia, unspecified: Secondary | ICD-10-CM

## 2012-01-18 DIAGNOSIS — E669 Obesity, unspecified: Secondary | ICD-10-CM

## 2012-01-18 DIAGNOSIS — N390 Urinary tract infection, site not specified: Secondary | ICD-10-CM | POA: Diagnosis present

## 2012-01-18 DIAGNOSIS — M170 Bilateral primary osteoarthritis of knee: Secondary | ICD-10-CM

## 2012-01-18 LAB — GLUCOSE, CAPILLARY: Glucose-Capillary: 221 mg/dL — ABNORMAL HIGH (ref 70–99)

## 2012-01-18 LAB — COMPREHENSIVE METABOLIC PANEL
BUN: 6 mg/dL (ref 6–23)
CO2: 28 mEq/L (ref 19–32)
Chloride: 99 mEq/L (ref 96–112)
Creatinine, Ser: 0.62 mg/dL (ref 0.50–1.10)
GFR calc non Af Amer: >90 mL/min (ref >90)
Total Bilirubin: 0.4 mg/dL (ref 0.3–1.2)

## 2012-01-18 LAB — CBC WITH DIFFERENTIAL/PLATELET
HCT: 43.2 % (ref 36.0–46.0)
Hemoglobin: 13.7 g/dL (ref 12.0–15.0)
Lymphocytes Relative: 17 % (ref 12–46)
Lymphs Abs: 1.8 10*3/uL (ref 0.7–4.0)
Monocytes Absolute: 0.3 10*3/uL (ref 0.1–1.0)
Monocytes Relative: 3 % (ref 3–12)
Neutro Abs: 8.2 10*3/uL — ABNORMAL HIGH (ref 1.7–7.7)
WBC: 10.5 10*3/uL (ref 4.0–10.5)

## 2012-01-18 LAB — LIPASE, BLOOD
Lipase: 26 U/L (ref 11–59)
Lipase: 26 U/L (ref 11–59)

## 2012-01-18 LAB — URINALYSIS, ROUTINE W REFLEX MICROSCOPIC
Ketones, ur: 15 mg/dL — AB
Protein, ur: >300 mg/dL — AB
Urobilinogen, UA: 1 mg/dL (ref 0.0–1.0)

## 2012-01-18 LAB — POCT I-STAT, CHEM 8
BUN: 4 mg/dL — ABNORMAL LOW (ref 6–23)
Calcium, Ion: 1.16 mmol/L (ref 1.12–1.23)
Glucose, Bld: 225 mg/dL — ABNORMAL HIGH (ref 70–99)
TCO2: 26 mmol/L (ref 0–100)

## 2012-01-18 LAB — AMYLASE: Amylase: 124 U/L — ABNORMAL HIGH (ref 0–105)

## 2012-01-18 LAB — URINE MICROSCOPIC-ADD ON

## 2012-01-18 LAB — APTT: aPTT: 30 seconds (ref 24–37)

## 2012-01-18 LAB — POCT I-STAT TROPONIN I: Troponin i, poc: 0 ng/mL (ref 0.00–0.08)

## 2012-01-18 LAB — TROPONIN I: Troponin I: <0.3 ng/mL (ref <0.30)

## 2012-01-18 LAB — PROTIME-INR
INR: 1.09 (ref 0.00–1.49)
Prothrombin Time: 14.3 seconds (ref 11.6–15.2)

## 2012-01-18 IMAGING — CT CT HEAD W/O CM
2 series · 16 of 30 positions shown, 20 images · non-contrast
Comparison: [DATE].

CLINICAL DATA: Vomiting

CT HEAD WITHOUT CONTRAST
TECHNIQUE: Contiguous axial images were obtained from the base of
the skull through the vertex without contrast.

[Series 2: head w/o · axial · non-contrast · 0.43mm/px · z∈[-128,+2]mm · 13 of 32 slices shown, 17 images]
[im 3/32  brain]
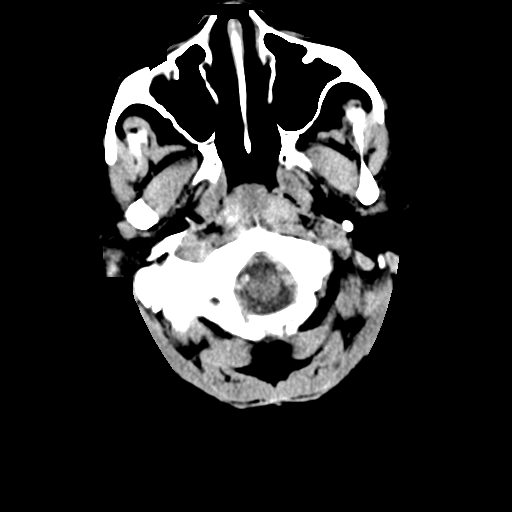
[im 3/32  bone]
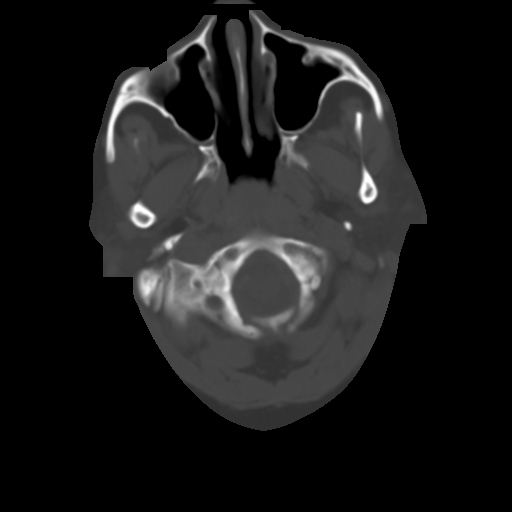
[im 5/32  brain]
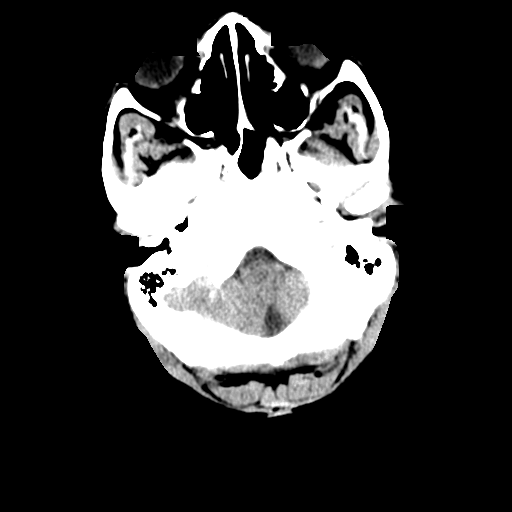
[im 7/32  brain]
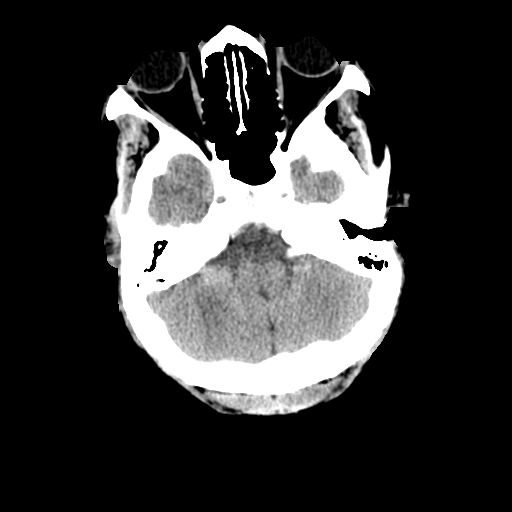
[im 9/32  brain]
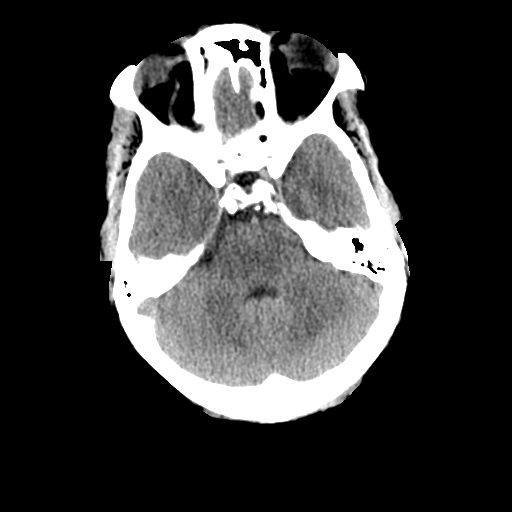
[im 12/32  brain]
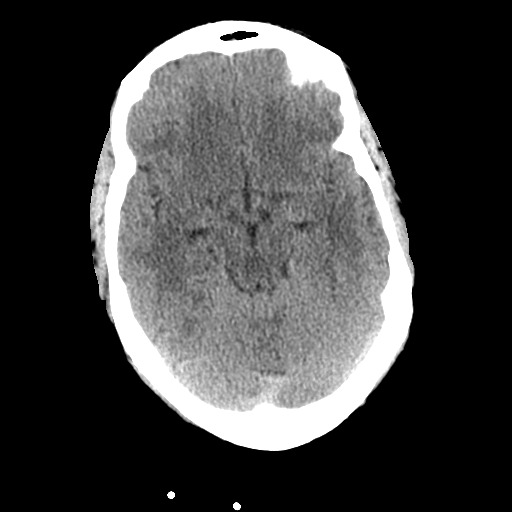
[im 12/32  bone]
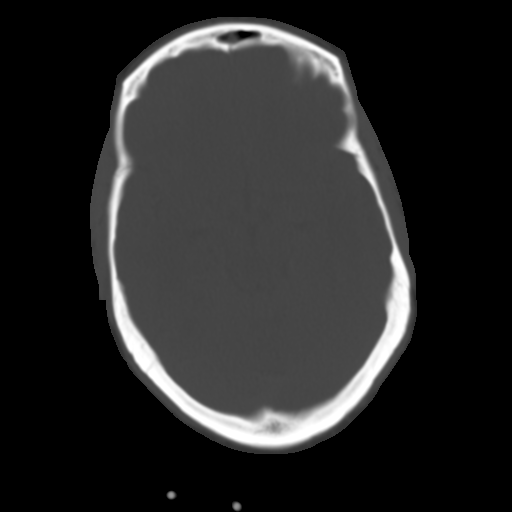
[im 14/32  brain]
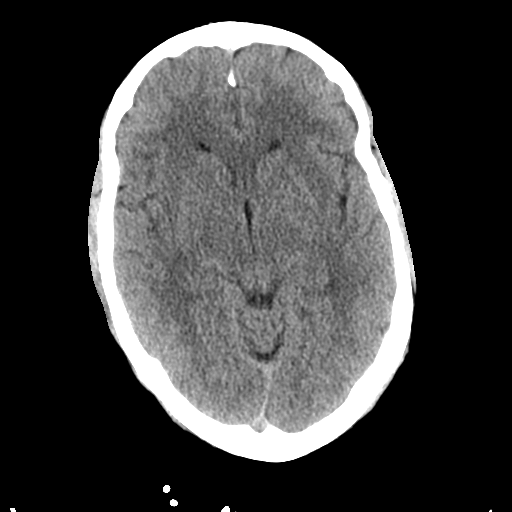
[im 16/32  brain]
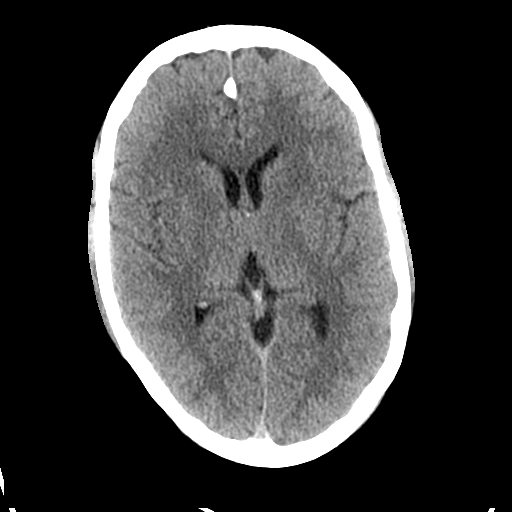
[im 18/32  brain]
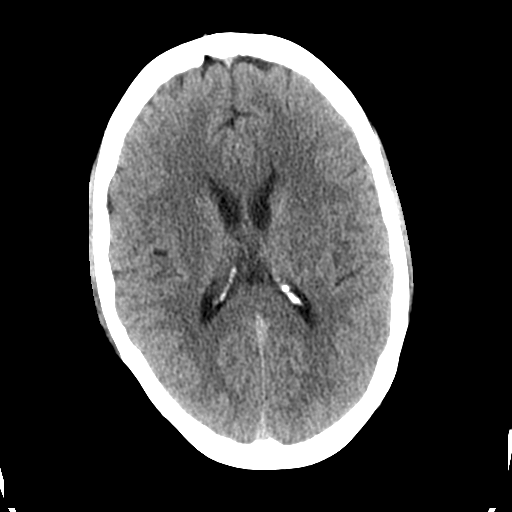
[im 20/32  brain]
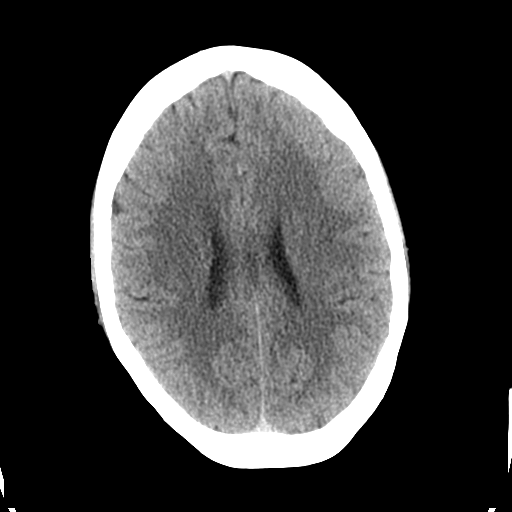
[im 20/32  bone]
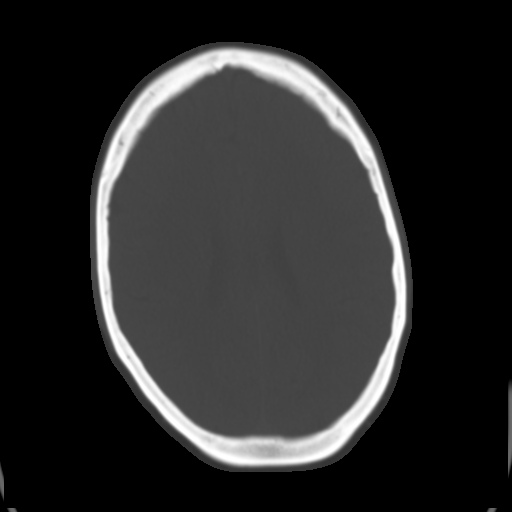
[im 23/32  brain]
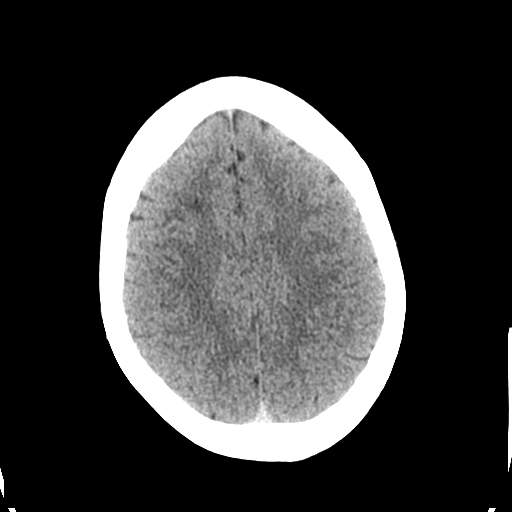
[im 25/32  brain]
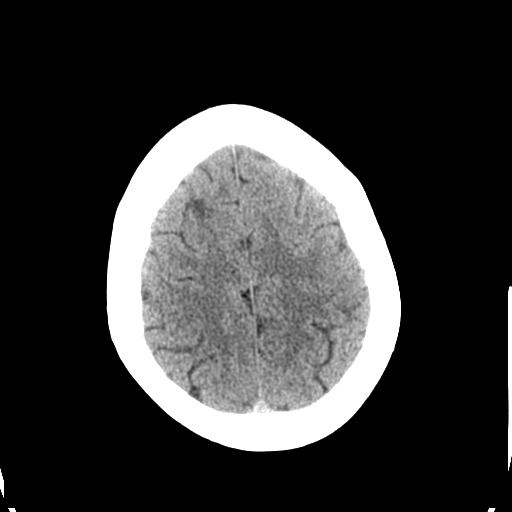
[im 27/32  brain]
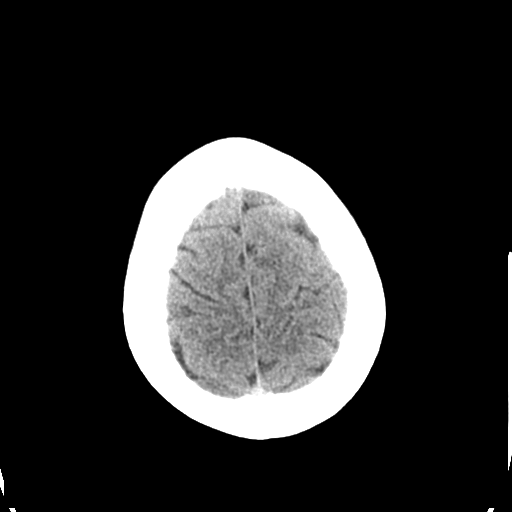
[im 29/32  brain]
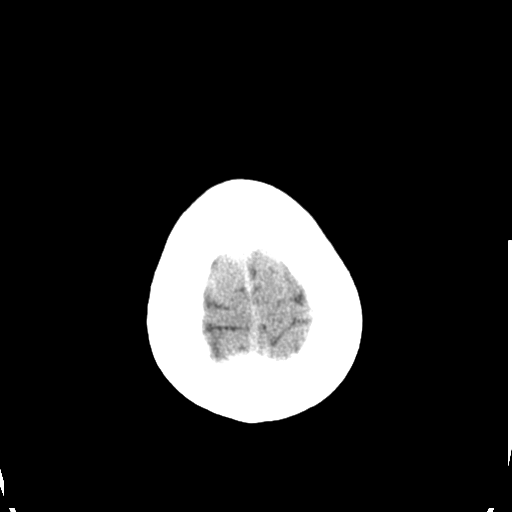
[im 29/32  bone]
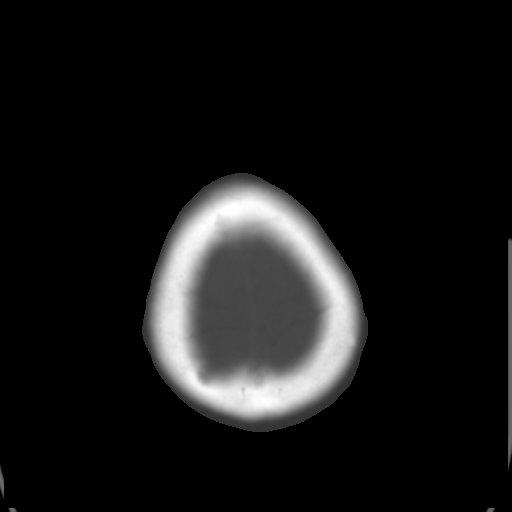

[Series 3: bone windows · axial · 0.43mm/px · z∈[-128,-83]mm · 3 of 32 slices shown]
[im 3/32  bone]
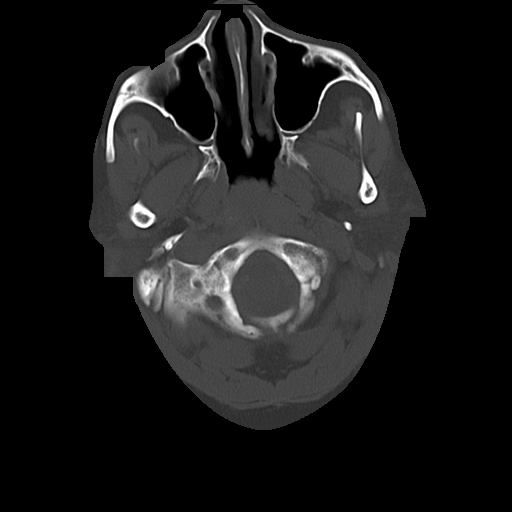
[im 7/32  bone]
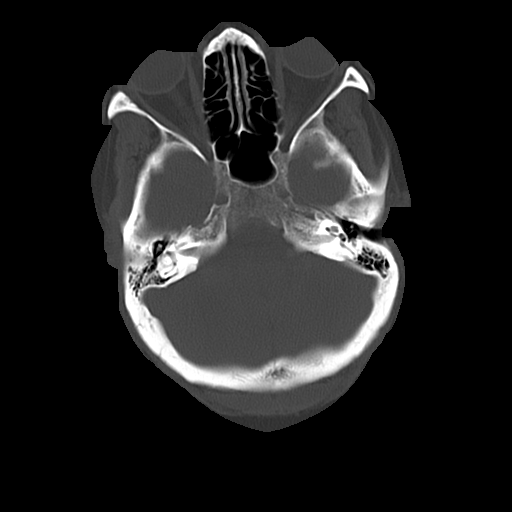
[im 12/32  bone]
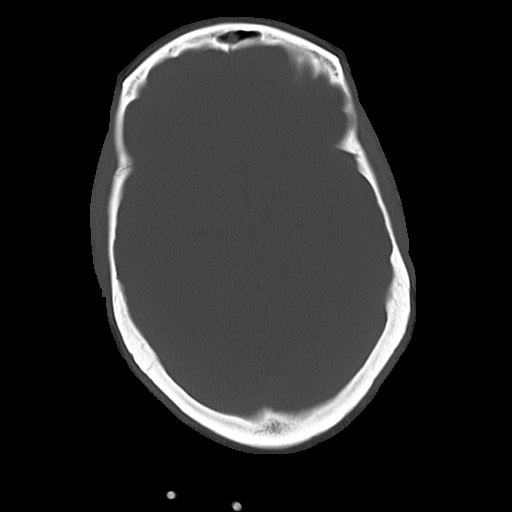

[16 of 30 positions shown; findings below may reference images not displayed]

FINDINGS: The brainstem and cerebellum appear unremarkable.  The
cerebral hemispheres are normal except for low density in the right
frontal white matter most consistent with an old small vessel
infarction.  Exact age is indeterminate . No evidence of mass
lesion, hemorrhage, hydrocephalus or extra-axial collection.  The
calvarium is unremarkable.  Sinuses, middle ears and mastoids are
clear.
IMPRESSION: 1 cm low density in the right frontal white matter likely to
represent an old infarction.  The exact age is indeterminate.  The
remainder the study is normal.

## 2012-01-18 IMAGING — CR DG ABDOMEN 1V
2 series · 2 of 2 positions shown · non-contrast
Comparison: [DATE] radiographs

CLINICAL DATA: Abdominal pelvic pain with nausea/vomiting.

ABDOMEN - 1 VIEW

[t abdomen supine (1 of 2)]
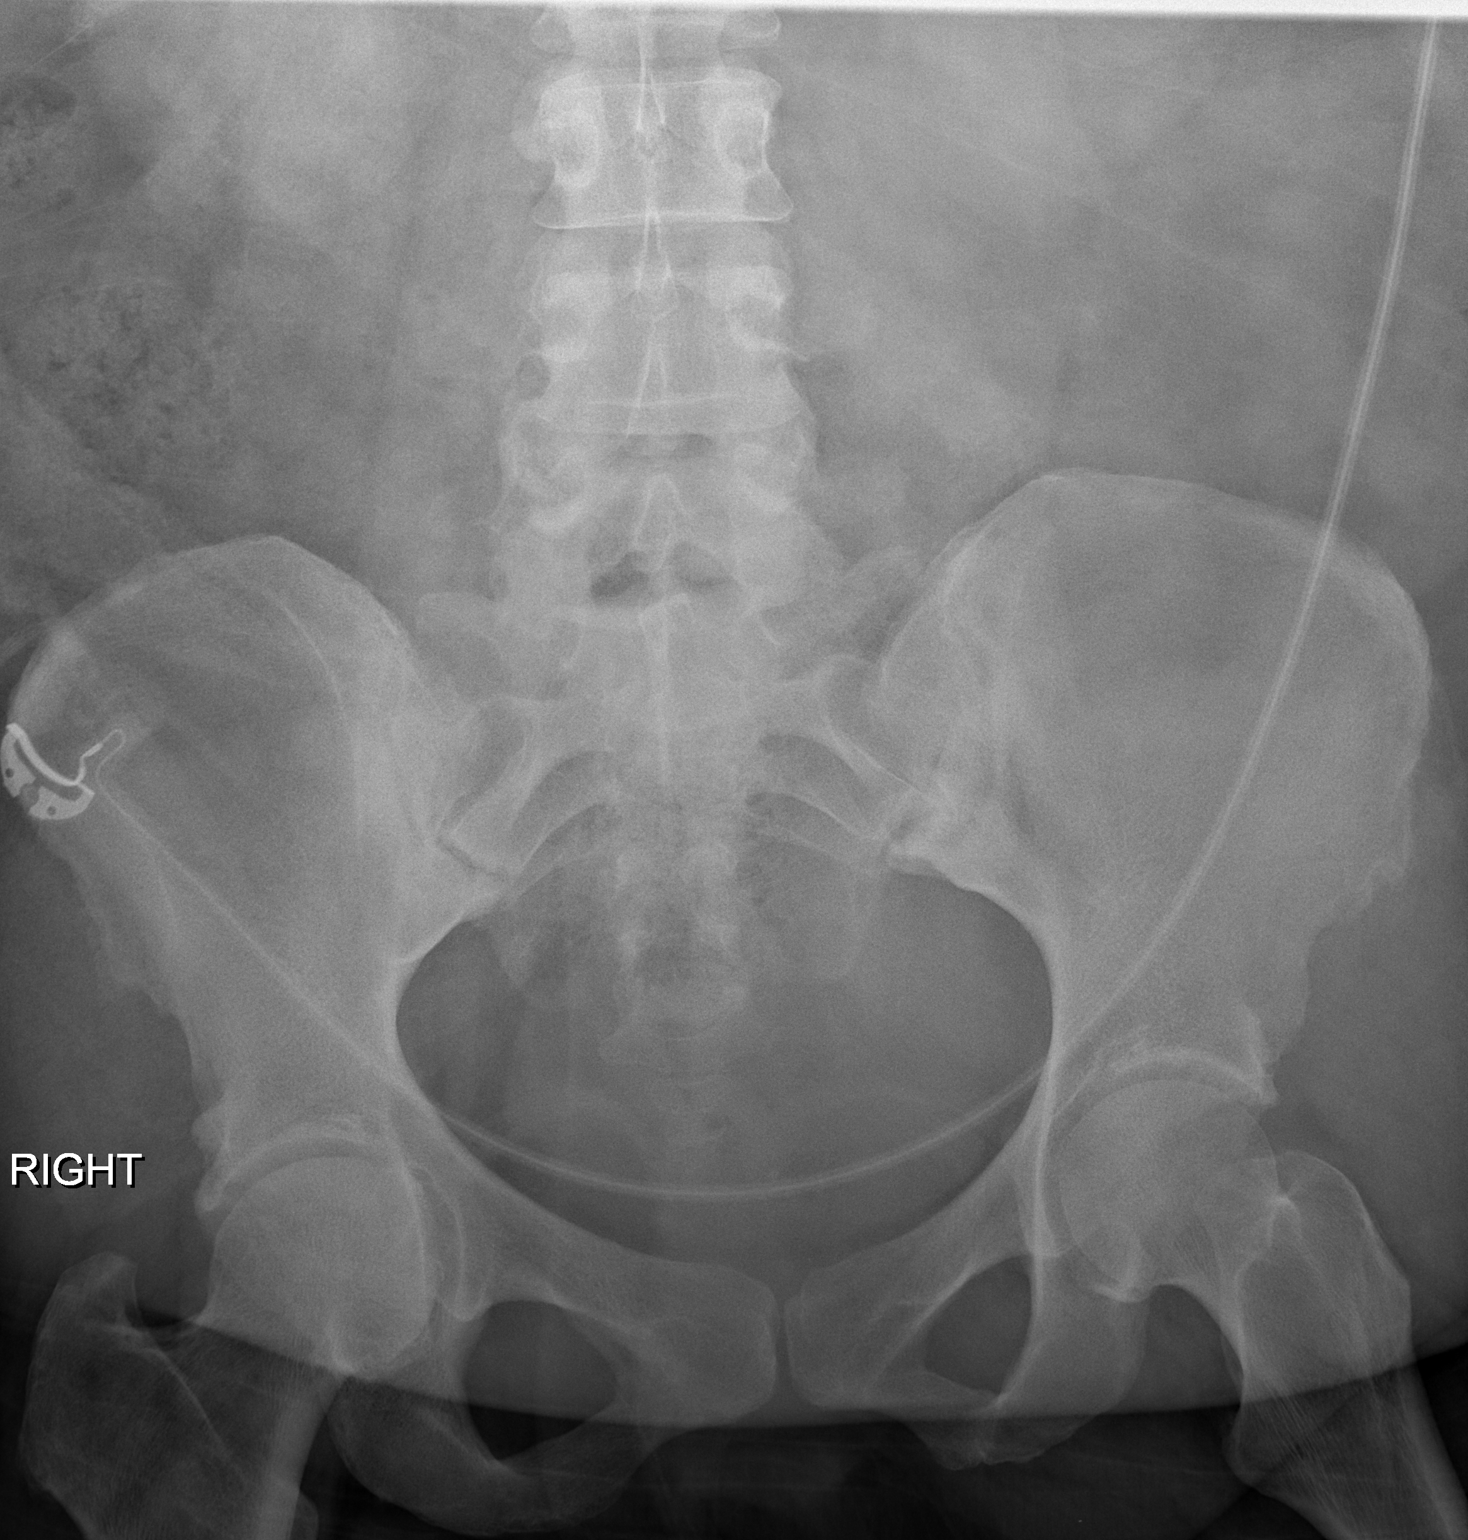

[t abdomen supine (2 of 2)]
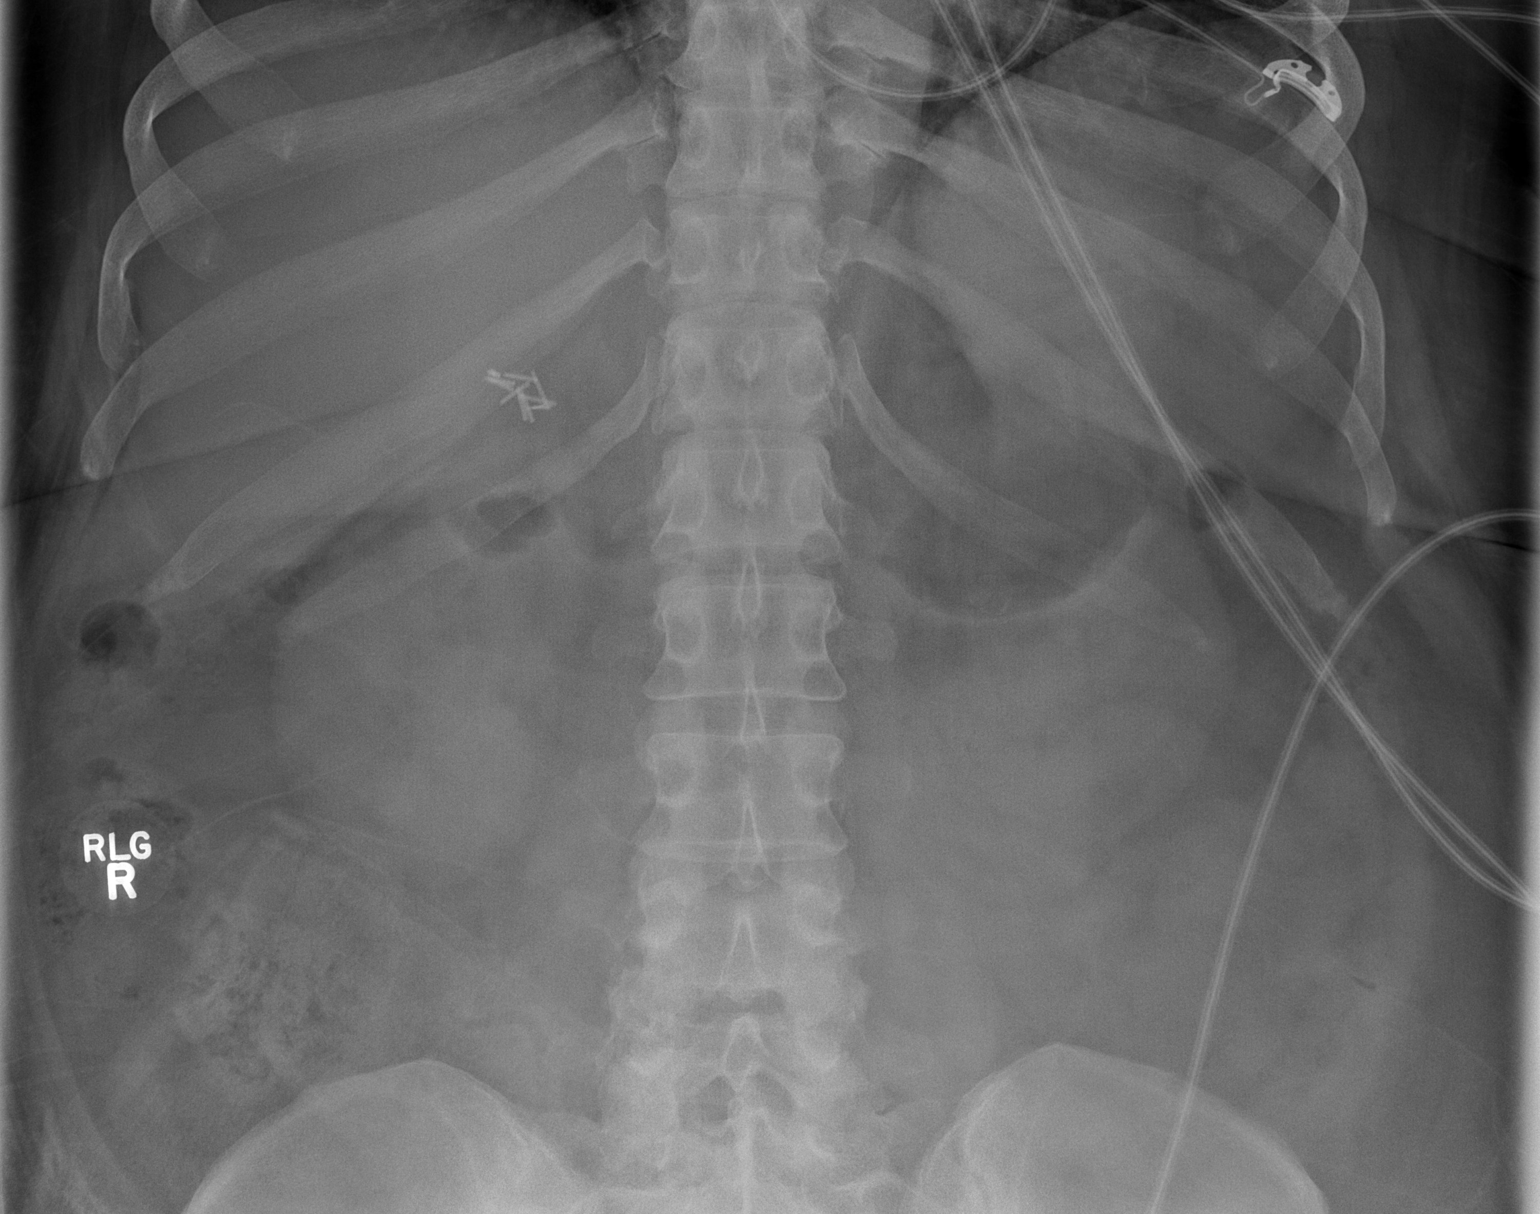

[2 of 2 positions shown; findings below may reference images not displayed]

FINDINGS: The bowel gas pattern is unremarkable.
There is no evidence of bowel obstruction.
No suspicious calcifications are identified.
Cholecystectomy clips are present.
No acute bony abnormalities are identified.
IMPRESSION: No acute or significant abnormalities identified..

## 2012-01-18 MED ORDER — ACETAMINOPHEN 325 MG PO TABS: 650.0000 mg | ORAL_TABLET | Freq: Four times a day (QID) | ORAL | Status: DC | PRN

## 2012-01-18 MED ORDER — ASPIRIN 300 MG RE SUPP
300.0000 mg | Freq: Once | RECTAL | Status: DC
  Filled 2012-01-18: qty 1

## 2012-01-18 MED ORDER — MORPHINE SULFATE 2 MG/ML IJ SOLN
2.0000 mg | Freq: Once | INTRAMUSCULAR | Status: AC
  Administered 2012-01-18: 2 mg via INTRAVENOUS
  Filled 2012-01-18: qty 1

## 2012-01-18 MED ORDER — ASPIRIN 325 MG PO TABS
325.0000 mg | ORAL_TABLET | Freq: Once | ORAL | Status: AC
  Administered 2012-01-18: 325 mg via ORAL
  Filled 2012-01-18: qty 1

## 2012-01-18 MED ORDER — HYDROMORPHONE HCL PF 1 MG/ML IJ SOLN
1.0000 mg | INTRAMUSCULAR | Status: DC | PRN
  Administered 2012-01-18 – 2012-01-19 (×4): 1 mg via INTRAVENOUS
  Filled 2012-01-18 (×4): qty 1

## 2012-01-18 MED ORDER — NITROGLYCERIN 0.4 MG SL SUBL
SUBLINGUAL_TABLET | SUBLINGUAL | Status: AC
  Administered 2012-01-18: 0.4 mg via SUBLINGUAL
  Filled 2012-01-18: qty 25

## 2012-01-18 MED ORDER — CEFTRIAXONE SODIUM 1 G IJ SOLR
1.0000 g | Freq: Once | INTRAMUSCULAR | Status: AC
  Administered 2012-01-18: 1 g via INTRAVENOUS
  Filled 2012-01-18: qty 10

## 2012-01-18 MED ORDER — ALUM & MAG HYDROXIDE-SIMETH 200-200-20 MG/5ML PO SUSP: 30.0000 mL | Freq: Four times a day (QID) | ORAL | Status: DC | PRN

## 2012-01-18 MED ORDER — LABETALOL HCL 5 MG/ML IV SOLN
20.0000 mg | Freq: Once | INTRAVENOUS | Status: AC
  Administered 2012-01-18 (×2): 10 mg via INTRAVENOUS
  Filled 2012-01-18 (×2): qty 4

## 2012-01-18 MED ORDER — PANTOPRAZOLE SODIUM 40 MG IV SOLR
40.0000 mg | Freq: Two times a day (BID) | INTRAVENOUS | Status: DC
  Administered 2012-01-18 – 2012-01-20 (×5): 40 mg via INTRAVENOUS
  Filled 2012-01-18 (×7): qty 40

## 2012-01-18 MED ORDER — PROMETHAZINE HCL 25 MG/ML IJ SOLN
12.5000 mg | Freq: Once | INTRAMUSCULAR | Status: AC
  Administered 2012-01-18: 12.5 mg via INTRAVENOUS
  Filled 2012-01-18: qty 1

## 2012-01-18 MED ORDER — NICARDIPINE HCL IN NACL 20-0.86 MG/200ML-% IV SOLN
5.0000 mg/h | Freq: Once | INTRAVENOUS | Status: AC
  Administered 2012-01-18: 5 mg/h via INTRAVENOUS
  Filled 2012-01-18: qty 200

## 2012-01-18 MED ORDER — DEXTROSE 5 % IV SOLN
1.0000 g | INTRAVENOUS | Status: DC
  Administered 2012-01-19 – 2012-01-20 (×2): 1 g via INTRAVENOUS
  Filled 2012-01-18 (×3): qty 10

## 2012-01-18 MED ORDER — ONDANSETRON HCL 4 MG/2ML IJ SOLN
INTRAMUSCULAR | Status: AC
  Administered 2012-01-18: 18:00:00
  Filled 2012-01-18: qty 2

## 2012-01-18 MED ORDER — PROMETHAZINE HCL 25 MG/ML IJ SOLN: 12.5000 mg | Freq: Four times a day (QID) | INTRAMUSCULAR | Status: DC | PRN

## 2012-01-18 MED ORDER — HEPARIN BOLUS VIA INFUSION
4000.0000 [IU] | Freq: Once | INTRAVENOUS | Status: AC
  Administered 2012-01-19: 4000 [IU] via INTRAVENOUS
  Filled 2012-01-18: qty 4000

## 2012-01-18 MED ORDER — SODIUM CHLORIDE 0.9 % IV SOLN
INTRAVENOUS | Status: DC
  Administered 2012-01-18: 20:00:00 via INTRAVENOUS

## 2012-01-18 MED ORDER — ONDANSETRON HCL 4 MG PO TABS: 4.0000 mg | ORAL_TABLET | ORAL | Status: DC | PRN

## 2012-01-18 MED ORDER — NICARDIPINE HCL IN NACL 20-0.86 MG/200ML-% IV SOLN
5.0000 mg/h | INTRAVENOUS | Status: DC
  Filled 2012-01-18 (×2): qty 200

## 2012-01-18 MED ORDER — ENOXAPARIN SODIUM 80 MG/0.8ML ~~LOC~~ SOLN
65.0000 mg | SUBCUTANEOUS | Status: DC
  Filled 2012-01-18: qty 0.8

## 2012-01-18 MED ORDER — NITROGLYCERIN IN D5W 200-5 MCG/ML-% IV SOLN
2.0000 ug/min | INTRAVENOUS | Status: DC
  Administered 2012-01-18: 20 ug/min via INTRAVENOUS
  Administered 2012-01-19: 70 ug/min via INTRAVENOUS
  Filled 2012-01-18 (×2): qty 250

## 2012-01-18 MED ORDER — ACETAMINOPHEN 650 MG RE SUPP: 650.0000 mg | Freq: Four times a day (QID) | RECTAL | Status: DC | PRN

## 2012-01-18 MED ORDER — ONDANSETRON HCL 4 MG/2ML IJ SOLN
4.0000 mg | Freq: Once | INTRAMUSCULAR | Status: AC
  Administered 2012-01-18: 4 mg via INTRAVENOUS
  Filled 2012-01-18: qty 2

## 2012-01-18 MED ORDER — INSULIN ASPART 100 UNIT/ML ~~LOC~~ SOLN
0.0000 [IU] | SUBCUTANEOUS | Status: DC
  Administered 2012-01-18: 3 [IU] via SUBCUTANEOUS
  Administered 2012-01-19: 5 [IU] via SUBCUTANEOUS
  Administered 2012-01-19 (×3): 3 [IU] via SUBCUTANEOUS
  Administered 2012-01-19: 5 [IU] via SUBCUTANEOUS
  Administered 2012-01-20: 2 [IU] via SUBCUTANEOUS
  Administered 2012-01-20: 1 [IU] via SUBCUTANEOUS
  Administered 2012-01-20: 2 [IU] via SUBCUTANEOUS
  Administered 2012-01-20: 3 [IU] via SUBCUTANEOUS
  Administered 2012-01-20: 1 [IU] via SUBCUTANEOUS

## 2012-01-18 MED ORDER — NITROGLYCERIN 0.4 MG SL SUBL
0.4000 mg | SUBLINGUAL_TABLET | SUBLINGUAL | Status: DC | PRN
  Administered 2012-01-18 (×3): 0.4 mg via SUBLINGUAL

## 2012-01-18 MED ORDER — ONDANSETRON HCL 4 MG/2ML IJ SOLN: 4.0000 mg | Freq: Three times a day (TID) | INTRAMUSCULAR | Status: DC | PRN

## 2012-01-18 MED ORDER — HEPARIN (PORCINE) IN NACL 100-0.45 UNIT/ML-% IJ SOLN
1250.0000 [IU]/h | INTRAMUSCULAR | Status: DC
  Administered 2012-01-19: 1250 [IU]/h via INTRAVENOUS
  Filled 2012-01-18: qty 250

## 2012-01-18 MED ORDER — ONDANSETRON HCL 4 MG/2ML IJ SOLN
4.0000 mg | INTRAMUSCULAR | Status: AC | PRN
  Administered 2012-01-18 – 2012-01-19 (×2): 4 mg via INTRAVENOUS
  Filled 2012-01-18 (×2): qty 2

## 2012-01-18 MED ORDER — SODIUM CHLORIDE 0.9 % IV BOLUS (SEPSIS)
1000.0000 mL | Freq: Once | INTRAVENOUS | Status: AC
  Administered 2012-01-18: 1000 mL via INTRAVENOUS

## 2012-01-18 NOTE — ED Notes (Signed)
Pt w/ sudden onset of emesis this morning around 1000. Denies pain, fever. Endorses one diarrhea stool, emesis 7-8 times since this morning.

## 2012-01-18 NOTE — ED Notes (Signed)
rn notified of cbg while in the room with pt

## 2012-01-18 NOTE — ED Provider Notes (Signed)
History     CSN: [id number]  Arrival date & time 01/18/12  80   First MD Initiated Contact with Patient 01/18/12 1641      Chief Complaint  Patient presents with  . Emesis    (Consider location/radiation/quality/duration/timing/severity/associated sxs/prior treatment) HPI Pt presents with multiple episodes of vomiting >10 starting at 1000 this AM. Last ate [employer] ham and cheese last night. She missed her evening HTN meds and was unable to take meds this AM. Has had 2 loose stools. No blood in either stool or vomit. No abd pain, HA, focal weakness, sensory changes, visual changes.  Past Medical History  Diagnosis Date  . Diabetes mellitus   . Hypertension   . GERD (gastroesophageal reflux disease)   . Nausea     Past Surgical History  Procedure Date  . Breast surgery     reduction  . Cesarean section     x2  . Cholecystectomy     laparoscopic    Family History  Problem Relation Age of Onset  . Hypertension Mother   . Diabetes Mother   . Cancer Father     lung    History  Substance Use Topics  . Smoking status: Former Smoker -- 10 years    Types: Cigarettes    Quit date: 10/16/2011  . Smokeless tobacco: Never Used   Comment: 1 pack will last a week  . Alcohol Use: No    OB History    Grav Para Term Preterm Abortions TAB SAB Ect Mult Living                  Review of Systems  Constitutional: Negative for fever and chills.  HENT: Negative for neck pain and neck stiffness.   Respiratory: Negative for shortness of breath.   Cardiovascular: Negative for chest pain, palpitations and leg swelling.  Gastrointestinal: Positive for nausea, vomiting and diarrhea. Negative for abdominal pain, constipation and abdominal distention.  Genitourinary: Negative for dysuria.  Musculoskeletal: Negative for myalgias and back pain.  Skin: Negative for rash.  Neurological: Negative for dizziness, syncope, weakness, light-headedness, numbness and headaches.     Allergies  Lisinopril  Home Medications   Current Outpatient Rx  Name Route Sig Dispense Refill  . AMLODIPINE BESYLATE 10 MG PO TABS Oral Take 10 mg by mouth daily.    . ASPIRIN 325 MG PO TABS Oral Take 325 mg by mouth daily.    Marland Kitchen CARVEDILOL 25 MG PO TABS Oral Take 25 mg by mouth 2 (two) times daily with a meal.    . CLONIDINE HCL 0.3 MG PO TABS Oral Take 0.3 mg by mouth 2 (two) times daily.    Marland Kitchen DOXEPIN HCL 10 MG PO CAPS Oral Take 10 mg by mouth at bedtime.    Marland Kitchen GLUCOSE BLOOD VI STRP  1 each. Use as instructed    . FREESTYLE SYSTEM KIT Does not apply 1 each by Does not apply route as needed.    Marland Kitchen HYDROCHLOROTHIAZIDE 25 MG PO TABS Oral Take 25 mg by mouth daily.    Marland Kitchen LOSARTAN POTASSIUM 25 MG PO TABS Oral Take 25 mg by mouth daily.    Marland Kitchen METFORMIN HCL 500 MG PO TABS Oral Take 500 mg by mouth 2 (two) times daily with a meal.    . OMEPRAZOLE 20 MG PO CPDR Oral Take 1 capsule (20 mg total) by mouth 2 (two) times daily. 60 capsule 5  . POTASSIUM CHLORIDE ER 10 MEQ PO TBCR Oral Take 20  mEq by mouth 2 (two) times daily.    Marland Kitchen POTASSIUM CHLORIDE CRYS ER 20 MEQ PO TBCR Oral Take 20 mEq by mouth 2 (two) times daily.    Marland Kitchen SIMVASTATIN 10 MG PO TABS Oral Take 10 mg by mouth at bedtime.      BP 205/96  Pulse 87  Temp 99.2 F (37.3 C) (Oral)  Resp 24  Ht 5\' 9"  (1.753 m)  Wt 291 lb (131.997 kg)  BMI 42.97 kg/m2  SpO2 98%  Physical Exam  Nursing note and vitals reviewed. Constitutional: She is oriented to person, place, and time. She appears well-developed and well-nourished. She appears distressed.  HENT:  Head: Normocephalic and atraumatic.  Mouth/Throat: Oropharynx is clear and moist.  Eyes: EOM are normal. Pupils are equal, round, and reactive to light.  Neck: Normal range of motion. Neck supple.  Cardiovascular: Normal rate and regular rhythm.   Pulmonary/Chest: Effort normal and breath sounds normal. No respiratory distress. She has no wheezes. She has no rales.  Abdominal: Soft.  Bowel sounds are normal. She exhibits no distension and no mass. There is no tenderness. There is no rebound and no guarding.  Musculoskeletal: Normal range of motion. She exhibits no edema and no tenderness.  Neurological: She is alert and oriented to person, place, and time.       5/5 motor in all ext, sensation intact.   Skin: Skin is warm and dry. No rash noted. No erythema.  Psychiatric: She has a normal mood and affect. Her behavior is normal.    ED Course  Procedures (including critical care time)  Labs Reviewed  CBC WITH DIFFERENTIAL - Abnormal; Notable for the following:    RBC 5.44 (*)     MCH 25.2 (*)     Platelets 459 (*)     Neutrophils Relative 79 (*)     Neutro Abs 8.2 (*)     All other components within normal limits  COMPREHENSIVE METABOLIC PANEL - Abnormal; Notable for the following:    Glucose, Bld 226 (*)     Total Protein 8.7 (*)     Alkaline Phosphatase 137 (*)     All other components within normal limits  URINALYSIS, ROUTINE W REFLEX MICROSCOPIC - Abnormal; Notable for the following:    APPearance CLOUDY (*)     Glucose, UA 100 (*)     Hgb urine dipstick SMALL (*)     Ketones, ur 15 (*)     Protein, ur >300 (*)     Nitrite POSITIVE (*)     All other components within normal limits  POCT I-STAT, CHEM 8 - Abnormal; Notable for the following:    BUN 4 (*)     Glucose, Bld 225 (*)     Hemoglobin 16.7 (*)     HCT 49.0 (*)     All other components within normal limits  URINE MICROSCOPIC-ADD ON - Abnormal; Notable for the following:    Bacteria, UA MANY (*)     All other components within normal limits  GLUCOSE, CAPILLARY - Abnormal; Notable for the following:    Glucose-Capillary 221 (*)     All other components within normal limits  LIPASE, BLOOD  POCT I-STAT TROPONIN I   Ct Head Wo Contrast  01/18/2012  *RADIOLOGY REPORT*  Clinical Data: Vomiting  CT HEAD WITHOUT CONTRAST  Technique:  Contiguous axial images were obtained from the base of the skull  through the vertex without contrast.  Comparison: 01/23/2005.  Findings:  The  brainstem and cerebellum appear unremarkable.  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  Exact age is indeterminate . No evidence of mass lesion, hemorrhage, hydrocephalus or extra-axial collection.  The calvarium is unremarkable.  Sinuses, middle ears and mastoids are clear.  IMPRESSION: 1 cm low density in the right frontal white matter likely to represent an old infarction.  The exact age is indeterminate.  The remainder the study is normal.   Original Report Authenticated By: Jules Schick, M.D.      1. Hypertensive urgency   2. Vomiting   3. UTI (urinary tract infection)       MDM   Pt continues to vomit in ED despite meds. Mild improvement with IV labetolol and then increased. Cardene started. Discussed with [facility] who will admit to stepdown.        Julianne Rice, MD 01/18/12 [phone]

## 2012-01-18 NOTE — ED Notes (Signed)
Pt sts she are Mrs [name] today for breakfast and shortly after she began experiencing N/V/D. Family member sts pt has vomited 8x and has runny stools 2x. Patient currently vomiting clear yellow bile. No other persons around patient have been experiencing same sx.

## 2012-01-18 NOTE — ED Notes (Signed)
Report given to RN in step down

## 2012-01-18 NOTE — Consult Note (Signed)
CARDIOLOGY CONSULT NOTE  Patient ID: [name] MRN: [id number], DOB/AGE: 53-Jan-1962   Admit date: 01/18/2012 Date of Consult: 01/18/2012   Primary Physician: Donnamae Jude, MD Primary Cardiologist: unassigned  Pt. Profile  ?EKG changes, admitted with hypertensive urgency  Problem List  Past Medical History  Diagnosis Date  . Diabetes mellitus   . Hypertension   . GERD (gastroesophageal reflux disease)   . Nausea     Past Surgical History  Procedure Date  . Breast surgery     reduction  . Cesarean section     x2  . Cholecystectomy     laparoscopic     Allergies  Allergies  Allergen Reactions  . Lisinopril     REACTION: angioedema    HPI   This is a 51 year old American female with history of hypertension on multiple antihypertensives, diabetes, hyperlipidemia, GERD presented with multiple episodes of nausea and vomiting that started today around 10 AM. Patient skipped her BP meds last night. She was able to tolerate some PO intake today but continued to have persistent N/V so decided to come to the ER. She also complains of mild left upper quadrant abdominal pain , 2 episodes of diarrhea with no hematochezia or melena. Patient was noted to have a systolic blood pressure in 200s, highest 221/101 and patient was started on cardene drip. Patient was admitted to the hospitalist service.  While she has been here she has developed pressure type sensation to the left upper quadrant of abdomen worse with palpation.  As she did not have optimal BP control, her Nipride was switched to NTG drip.  Her EKG showed sinus tachy with inferolateral ST depression. First set of cardiac enzymes were negative.  CT head reportedly negative. Cardiology is consulted for further evaluation.  Inpatient Medications     . aspirin  325 mg Oral Once  . cefTRIAXone (ROCEPHIN)  IV  1 g Intravenous Once  . cefTRIAXone (ROCEPHIN)  IV  1 g Intravenous Q24H  . heparin  4,000 Units Intravenous  Once  . insulin aspart  0-9 Units Subcutaneous Q4H  . labetalol  20 mg Intravenous Once  .  morphine injection  2 mg Intravenous Once  . niCARDipine  5 mg/hr Intravenous Once  . ondansetron      . ondansetron (ZOFRAN) IV  4 mg Intravenous Once  . pantoprazole (PROTONIX) IV  40 mg Intravenous Q12H  . promethazine  12.5 mg Intravenous Once  . sodium chloride  1,000 mL Intravenous Once  . DISCONTD: aspirin  300 mg Rectal Once  . DISCONTD: enoxaparin (LOVENOX) injection  65 mg Subcutaneous Q24H    Family History Family History  Problem Relation Age of Onset  . Hypertension Mother   . Diabetes Mother   . Cancer Father     lung     Social History History   Social History  . Marital Status: Legally Separated    Spouse Name: N/A    Number of Children: N/A  . Years of Education: N/A   Occupational History  . Not on file.   Social History Main Topics  . Smoking status: Former Smoker -- 10 years    Types: Cigarettes    Quit date: 10/16/2011  . Smokeless tobacco: Never Used   Comment: 1 pack will last a week  . Alcohol Use: No  . Drug Use: No  . Sexually Active: No   Other Topics Concern  . Not on file   Social History Narrative  .  No narrative on file     Review of Systems  Constitutional: Denies fever, chills, diaphoresis, + appetite change, unable to tolerate anything by mouth and fatigue.  HEENT: Denies photophobia, eye pain, redness, hearing loss, ear pain, congestion, sore throat, rhinorrhea, sneezing, mouth sores, trouble swallowing, neck pain, neck stiffness and tinnitus.  Respiratory: Denies SOB, DOE, cough, chest tightness, and wheezing.  Cardiovascular: Denies chest pain, palpitations and leg swelling.  Gastrointestinal: see HPI  Genitourinary: Denies dysuria, urgency, frequency, hematuria, flank pain and difficulty urinating.  Musculoskeletal: Denies myalgias, back pain, joint swelling, arthralgias and gait problem.  Skin: Denies pallor, rash and wound.    Neurological: see HPI  Hematological: Denies adenopathy. Easy bruising, personal or family bleeding history  Psychiatric/Behavioral: Denies suicidal ideation, mood changes, confusion, nervousness, sleep disturbance and agitation   Physical Exam  Blood pressure 189/93, pulse 117, temperature 99.2 F (37.3 C), temperature source Oral, resp. rate 20, height 5\' 9"  (1.753 m), weight 130.8 kg (288 lb 5.8 oz), SpO2 100.00%.  General: Pleasant, appears distressed secondary to pain. Psych: Normal affect. Neuro: Alert and oriented X 3. Moves all extremities spontaneously. HEENT: Normal  Neck: Supple without bruits or JVD. Lungs:  Resp regular and unlabored, CTA. Heart: RRR no s3, s4, or murmurs. Abdomen: Soft, non-tender, non-distended, BS + x 4.  Extremities: No clubbing, cyanosis or edema. DP/PT/Radials 2+ and equal bilaterally.  Labs   [facility] 01/18/12 1943  CKTOTAL 85  CKMB 2.3  TROPONINI <0.30   Lab Results  Component Value Date   WBC 10.5 01/18/2012   HGB 16.7* 01/18/2012   HCT 49.0* 01/18/2012   MCV 79.4 01/18/2012   PLT 459* 01/18/2012    Lab 01/18/12 1708 01/18/12 1645  NA 143 --  K 3.7 --  CL 104 --  CO2 -- 28  BUN 4* --  CREATININE 0.80 --  CALCIUM -- 10.2  PROT -- 8.7*  BILITOT -- 0.4  ALKPHOS -- 137*  ALT -- 16  AST -- 14  GLUCOSE 225* --   Lab Results  Component Value Date   CHOL 209* 03/15/2011   HDL 49 03/15/2011   LDLCALC 146* 03/15/2011   TRIG 72 03/15/2011   Lab Results  Component Value Date   DDIMER  Value: 0.54        AT THE INHOUSE ESTABLISHED CUTOFF VALUE OF 0.48 ug/mL FEU, THIS ASSAY HAS BEEN DOCUMENTED IN THE LITERATURE TO HAVE A SENSITIVITY AND NEGATIVE PREDICTIVE VALUE OF AT LEAST 98 TO 99%.  THE TEST RESULT SHOULD BE CORRELATED WITH AN ASSESSMENT OF THE CLINICAL PROBABILITY OF DVT / VTE.* 09/26/2008    Radiology/Studies  Dg Abd 1 View  01/18/2012  *RADIOLOGY REPORT*  Clinical Data: Abdominal pelvic pain with nausea/vomiting.  ABDOMEN - 1 VIEW   Comparison: 01/22/2006 radiographs  Findings: The bowel gas pattern is unremarkable. There is no evidence of bowel obstruction. No suspicious calcifications are identified. Cholecystectomy clips are present. No acute bony abnormalities are identified.  IMPRESSION: No acute or significant abnormalities identified.   Original Report Authenticated By: Lura Em, M.D.    Ct Head Wo Contrast  01/18/2012  *RADIOLOGY REPORT*  Clinical Data: Vomiting  CT HEAD WITHOUT CONTRAST  Technique:  Contiguous axial images were obtained from the base of the skull through the vertex without contrast.  Comparison: 01/23/2005.  Findings:  The brainstem and cerebellum appear unremarkable.  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  Exact age is indeterminate . No evidence of mass lesion, hemorrhage, hydrocephalus or extra-axial collection.  The calvarium is unremarkable.  Sinuses, middle ears and mastoids are clear.  IMPRESSION: 1 cm low density in the right frontal white matter likely to represent an old infarction.  The exact age is indeterminate.  The remainder the study is normal.   Original Report Authenticated By: Jules Schick, M.D.     ECG  Sinus tachycardia (VR 120s bpm), horizontal ST-D inferolateral leads  ASSESSMENT 1) Hypertensive urgency 2) LUQ abdominal pain (?etiology) though probably triggering sinus tachycardia and ST-D 3) DM   PLAN 1) continue to lower BP with IV NTG and add Nipride 2) follow cardiac enzymes and EKGs 3) Once BP is better controlled and imaging shows no evidence for bleeding, may use heparin drip per ACS until MI ruled out 4) May continue ASA 5) Continue workup of abd pain with CT as ordered 6) Thanks for consult and will follow and make recommendations as needed  Signed, Costella Hatcher, MD 01/18/2012, 11:03 PM

## 2012-01-18 NOTE — Progress Notes (Signed)
ANTICOAGULATION CONSULT NOTE - Initial Consult  Pharmacy Consult for Heparin Indication: R/O MI  Allergies  Allergen Reactions  . Lisinopril     REACTION: angioedema    Patient Measurements: Height: 5\' 9"  (175.3 cm) Weight: 288 lb 5.8 oz (130.8 kg) IBW/kg (Calculated) : 66.2  Heparin Dosing Weight: 97 kg  Vital Signs: Temp: 99.2 F (37.3 C) (09/14 1609) Temp src: Oral (09/14 1609) BP: 189/93 mmHg (09/14 2245) Pulse Rate: 117  (09/14 2245)  Labs:  [facility] 01/18/12 1943 01/18/12 1708 01/18/12 1645  HGB -- 16.7* 13.7  HCT -- 49.0* 43.2  PLT -- -- 459*  APTT -- -- --  LABPROT -- -- --  INR -- -- --  HEPARINUNFRC -- -- --  CREATININE -- 0.80 0.62  CKTOTAL 85 -- --  CKMB 2.3 -- --  TROPONINI <0.30 -- --    Estimated Creatinine Clearance: 119.5 ml/min (by C-G formula based on Cr of 0.8).   Medical History: Past Medical History  Diagnosis Date  . Diabetes mellitus   . Hypertension   . GERD (gastroesophageal reflux disease)   . Nausea     Medications:  Scheduled:    . aspirin  325 mg Oral Once  . cefTRIAXone (ROCEPHIN)  IV  1 g Intravenous Once  . cefTRIAXone (ROCEPHIN)  IV  1 g Intravenous Q24H  . insulin aspart  0-9 Units Subcutaneous Q4H  . labetalol  20 mg Intravenous Once  .  morphine injection  2 mg Intravenous Once  . niCARDipine  5 mg/hr Intravenous Once  . ondansetron      . ondansetron (ZOFRAN) IV  4 mg Intravenous Once  . pantoprazole (PROTONIX) IV  40 mg Intravenous Q12H  . promethazine  12.5 mg Intravenous Once  . sodium chloride  1,000 mL Intravenous Once  . DISCONTD: aspirin  300 mg Rectal Once  . DISCONTD: enoxaparin (LOVENOX) injection  65 mg Subcutaneous Q24H   Infusions:    . sodium chloride    . nitroGLYCERIN 20 mcg/min (01/18/12 2145)  . DISCONTD: niCARDipine Stopped (01/18/12 2145)    Assessment:  51 year old with initial chief complaint of nausea/vomiting. Found to have BP as high as 221/101. 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  IV heparin to begin for r/o MI.  Cardiology consult has been requested  Lovenox for DVT prophylaxis, ordered previously upon admission has been discontinued.  No Lovenox has been charted as given. Goal of Therapy:  Heparin level 0.3-0.7 units/ml Monitor platelets by anticoagulation protocol: Yes   Plan:   Obtain baseline PTT and PT/INR  Heparin 4000 unit IV bolus x 1 then followed by heparin drip @ 1250 units/hr  Check heparin level 6 hr after heparin started  Check daily heparin level & CBC while on heparin  [name], [name], PharmD 01/18/2012,10:49 PM

## 2012-01-18 NOTE — ED Notes (Signed)
1 set of blood cultures drawn in case MD would like them. Antibiotics started after blood cultures drawn.

## 2012-01-18 NOTE — ED Notes (Signed)
Report attempted. RN unavailable. Phone number given for call back.

## 2012-01-18 NOTE — Consult Note (Signed)
Name: [name] MRN: [id number] DOB: [birth year]    LOS: 0  PULMONARY / CRITICAL CARE MEDICINE  HPI:  Ms. [name] is a 51 year old lady who presented to the emergency department today following several hours of nausea and vomiting and chest pressure. This started at 10 AM this morning. She actually threw up, had no prodromal symptoms. Since then she's been describing pressure in her left lower chest versus left upper quadrant of her abdomen. She reports that this does not radiate, although her participation in my exam is limited. She has received Dilaudid for the chest pressure. Her blood pressures were quite elevated and this was not responding to nicardipine drip; she was transitioned to nitroglycerin drip, after 3 nitroglycerin dropped her pain from a 10 to a 2. However she continues to have some chest pressure.  Past Medical History  Diagnosis Date  . Diabetes mellitus   . Hypertension   . GERD (gastroesophageal reflux disease)   . Nausea    Past Surgical History  Procedure Date  . Breast surgery     reduction  . Cesarean section     x2  . Cholecystectomy     laparoscopic   Prior to Admission medications   Medication Sig Start Date End Date Taking? Authorizing Provider  amLODipine (NORVASC) 10 MG tablet Take 10 mg by mouth daily. 01/16/12  Yes Donnamae Jude, MD  aspirin 325 MG tablet Take 325 mg by mouth daily. 01/16/12  Yes Donnamae Jude, MD  carvedilol (COREG) 25 MG tablet Take 25 mg by mouth 2 (two) times daily with a meal. 01/16/12  Yes Donnamae Jude, MD  cloNIDine (CATAPRES) 0.3 MG tablet Take 0.3 mg by mouth 2 (two) times daily. 01/16/12  Yes Donnamae Jude, MD  doxepin (SINEQUAN) 10 MG capsule Take 10 mg by mouth at bedtime. 01/16/12 01/15/13 Yes Donnamae Jude, MD  glucose blood test strip 1 each. Use as instructed 03/11/11 03/10/12 Yes Donnamae Jude, MD  glucose monitoring kit (FREESTYLE) monitoring kit 1 each by Does not apply route as needed. 03/11/11 03/10/12 Yes Donnamae Jude, MD   hydrochlorothiazide (HYDRODIURIL) 25 MG tablet Take 25 mg by mouth daily. 01/16/12  Yes Donnamae Jude, MD  losartan (COZAAR) 25 MG tablet Take 25 mg by mouth daily. 01/16/12 01/15/13 Yes Donnamae Jude, MD  metFORMIN (GLUCOPHAGE) 500 MG tablet Take 500 mg by mouth 2 (two) times daily with a meal. 01/16/12  Yes Donnamae Jude, MD  omeprazole (PRILOSEC) 20 MG capsule Take 1 capsule (20 mg total) by mouth 2 (two) times daily. 01/16/12  Yes Donnamae Jude, MD  potassium chloride (K-DUR) 10 MEQ tablet Take 20 mEq by mouth 2 (two) times daily. 07/18/11 07/17/12 Yes Donnamae Jude, MD  potassium chloride SA (K-DUR,KLOR-CON) 20 MEQ tablet Take 20 mEq by mouth 2 (two) times daily. 01/16/12 01/15/13 Yes Donnamae Jude, MD  simvastatin (ZOCOR) 10 MG tablet Take 10 mg by mouth at bedtime. 01/16/12 01/15/13 Yes Donnamae Jude, MD   Allergies Allergies  Allergen Reactions  . Lisinopril     REACTION: angioedema    Family History Family History  Problem Relation Age of Onset  . Hypertension Mother   . Diabetes Mother   . Cancer Father     lung   Social History  reports that she quit smoking about 3 months ago. Her smoking use included Cigarettes. She quit after 10 years of use. She has never used smokeless tobacco. She  reports that she does not drink alcohol or use illicit drugs.  Review Of Systems:  Unable to provide.  Vital Signs: Temp:  [99.2 F (37.3 C)] 99.2 F (37.3 C) (09/14 1609) Pulse Rate:  [75-131] 117  (09/14 2245) Resp:  [12-29] 20  (09/14 2245) BP: (156-221)/(66-115) 189/93 mmHg (09/14 2245) SpO2:  [94 %-100 %] 100 % (09/14 2245) Weight:  [130.8 kg (288 lb 5.8 oz)-131.997 kg (291 lb)] 130.8 kg (288 lb 5.8 oz) (09/14 2000)  Physical Examination: General:  Somewhat sleepy and not completely interactive. Neuro:  Follows most commands but is somewhat slow and delayed. HEENT:  PERRL, pink conjunctivae, moist membranes Neck:  Supple, no JVD   Cardiovascular:  RRR, no M/R/G Lungs:  Bilateral  diminished air entry, no W/R/R Abdomen:  Soft, appears tender in the left upper quadrant with no tenderness in any other quadrant, nondistended, bowel sounds present Musculoskeletal:  Moves all extremities, no pedal edema Skin:  No rash  Principal Problem:  *HTN (hypertension), malignant Active Problems:  DIABETES MELLITUS, NONINSULIN DEPENDENT (NIDDM)  Hypercholesteremia  GERD (gastroesophageal reflux disease)  Nausea and vomiting  UTI (urinary tract infection)  Diarrhea   ASSESSMENT AND PLAN  CARDIOVASCULAR  Lab 01/18/12 1943  TROPONINI <0.30  LATICACIDVEN --  PROBNP --   ECG:  ST depressions in the inferolateral leads, new from earlier today Lines: PIV  A: Chest pain or pressure with ST changes, hypertensive crisis P:  It is not clear that this represents a purely cardiac source, however it is somewhat concerning that she continues to have some chest pressure with evolving ST depression on her EKG. It's reassuring that her enzymes are negative thus far as her symptoms started at 10:00 this morning. Would check a lactate to ensure that she is not having some other reason to have left upper quadrant pain like ischemic bowel. The hypertensive crisis could certainly be precipitating some ischemia.   GASTROINTESTINAL  Lab 01/18/12 1645  AST 14  ALT 16  ALKPHOS 137*  BILITOT 0.4  PROT 8.7*  ALBUMIN 3.8    A:  Nausea, vomiting, and diarrhea P:   C. differential is pending though her normal white count would argue against this. This could still be some ischemic bowel and I agree with the CT abdomen and pelvis. An additional consideration could be her pancreas although her amylase and lipase were normal. Her last labs indicate that she is probably third spacing some fluids as she appears more hemoconcentrated. However this was a point-of-care test and it is more likely that this was merely an error. She does also have a very slight gamma gap with an elevated total protein but  a normal albumin. I would not be critical now but would be of consideration to evaluate for a hypergammaglobulinemia.  INFECTIOUS  Lab 01/18/12 1645  WBC 10.5  PROCALCITON --   Cultures: None Antibiotics: Ceftriaxone 1 g every 24  A:  Possible UTI P:   Patient's UA was mildly dirty with many bacteria. An infected renal stone could certainly be causing some of her symptoms as well however again a normal white count and being particularly hypertensive with potentially argue against this.  ENDOCRINE  Lab 01/18/12 1831  GLUCAP 221*   A:  Hyperglycemia   P:   Tight glucose control   BEST PRACTICE / DISPOSITION - Level of Care:  ICU - Primary Service:  [facility] - Consultants:  Cardiology  - Code Status:  Full - Diet:  N.p.o. for now -  DVT Px:  Heparin infusion - GI Px:  Protonix - Skin Integrity:  Normal - Social / Family:  Discussed with family multiple complicated issues  Gaylyn Cheers, M.D. Pulmonary and [place] Pager: [phone]  01/18/2012, 10:54 PM

## 2012-01-18 NOTE — H&P (Signed)
History and Physical       Hospital Admission Note Date: 01/18/2012  Patient name: [name] Medical record number: [id number] Date of birth: 02-15-1961 Age: 51 y.o. Gender: female PCP: Donnamae Jude, MD    Chief Complaint:  Nausea vomiting since morning   HPI: 51 year old American female with history of hypertension on multiple antihypertensives, diabetes, hyperlipidemia, GERD presented with multiple episodes of nausea and vomiting that started today. History was obtained from the patient and her daughter in the room. Patient had dinner at [employer] last night and breakfast in restaurant this morning, family members to not have the same symptoms. The patient was able to tolerate breakfast, took her a.m. medications however around 10:00 she started having intractable episodes of nausea and vomiting. She also complains of mild left upper quadrant abdominal pain , 2 episodes of diarrhea with no hematochezia or melena. Patient was noted to have a systolic blood pressure in 200s, highest 221/101 and patient was started on cardene drip. Hospitalist service was requested for admission  Review of Systems:  Constitutional: Denies fever, chills, diaphoresis, + appetite change, unable to tolerate anything by mouth and fatigue.  HEENT: Denies photophobia, eye pain, redness, hearing loss, ear pain, congestion, sore throat, rhinorrhea, sneezing, mouth sores, trouble swallowing, neck pain, neck stiffness and tinnitus.   Respiratory: Denies SOB, DOE, cough, chest tightness,  and wheezing.   Cardiovascular: Denies chest pain, palpitations and leg swelling.  Gastrointestinal: see HPI Genitourinary: Denies dysuria, urgency, frequency, hematuria, flank pain and difficulty urinating.  Musculoskeletal: Denies myalgias, back pain, joint swelling, arthralgias and gait problem.  Skin: Denies pallor, rash and wound.  Neurological: see  HPI Hematological: Denies adenopathy. Easy bruising, personal or family bleeding history  Psychiatric/Behavioral: Denies suicidal ideation, mood changes, confusion, nervousness, sleep disturbance and agitation  Past Medical History: Past Medical History  Diagnosis Date  . Diabetes mellitus   . Hypertension   . GERD (gastroesophageal reflux disease)   . Nausea    Past Surgical History  Procedure Date  . Breast surgery     reduction  . Cesarean section     x2  . Cholecystectomy     laparoscopic    Medications: Prior to Admission medications   Medication Sig Start Date End Date Taking? Authorizing Provider  amLODipine (NORVASC) 10 MG tablet Take 10 mg by mouth daily. 01/16/12  Yes Donnamae Jude, MD  aspirin 325 MG tablet Take 325 mg by mouth daily. 01/16/12  Yes Donnamae Jude, MD  carvedilol (COREG) 25 MG tablet Take 25 mg by mouth 2 (two) times daily with a meal. 01/16/12  Yes Donnamae Jude, MD  cloNIDine (CATAPRES) 0.3 MG tablet Take 0.3 mg by mouth 2 (two) times daily. 01/16/12  Yes Donnamae Jude, MD  doxepin (SINEQUAN) 10 MG capsule Take 10 mg by mouth at bedtime. 01/16/12 01/15/13 Yes Donnamae Jude, MD  glucose blood test strip 1 each. Use as instructed 03/11/11 03/10/12 Yes Donnamae Jude, MD  glucose monitoring kit (FREESTYLE) monitoring kit 1 each by Does not apply route as needed. 03/11/11 03/10/12 Yes Donnamae Jude, MD  hydrochlorothiazide (HYDRODIURIL) 25 MG tablet Take 25 mg by mouth daily. 01/16/12  Yes Donnamae Jude, MD  losartan (COZAAR) 25 MG tablet Take 25 mg by mouth daily. 01/16/12 01/15/13 Yes Donnamae Jude, MD  metFORMIN (GLUCOPHAGE) 500 MG tablet Take 500 mg by mouth 2 (two) times daily with a meal. 01/16/12  Yes Donnamae Jude, MD  omeprazole (  PRILOSEC) 20 MG capsule Take 1 capsule (20 mg total) by mouth 2 (two) times daily. 01/16/12  Yes Donnamae Jude, MD  potassium chloride (K-DUR) 10 MEQ tablet Take 20 mEq by mouth 2 (two) times daily. 07/18/11 07/17/12 Yes Donnamae Jude, MD   potassium chloride SA (K-DUR,KLOR-CON) 20 MEQ tablet Take 20 mEq by mouth 2 (two) times daily. 01/16/12 01/15/13 Yes Donnamae Jude, MD  simvastatin (ZOCOR) 10 MG tablet Take 10 mg by mouth at bedtime. 01/16/12 01/15/13 Yes Donnamae Jude, MD    Allergies:   Allergies  Allergen Reactions  . Lisinopril     REACTION: angioedema    Social History:  reports that she quit smoking about 3 months ago. Her smoking use included Cigarettes. She quit after 10 years of use. She has never used smokeless tobacco. She reports that she does not drink alcohol or use illicit drugs.  Family History: Family History  Problem Relation Age of Onset  . Hypertension Mother   . Diabetes Mother   . Cancer Father     lung    Physical Exam: Blood pressure 203/83, pulse 103, temperature 99.2 F (37.3 C), temperature source Oral, resp. rate 22, height 5\' 9"  (1.753 m), weight 131.997 kg (291 lb), SpO2 100.00%. General: Alert, awake, oriented x3, appears miserable HEENT: normocephalic, atraumatic, anicteric sclera, pink conjunctiva, pupils equal and reactive to light and accomodation, oropharynx clear Neck: supple, no masses or lymphadenopathy, no goiter, no bruits  Heart: Regular rate and rhythm, without murmurs, rubs or gallops. Lungs: Clear to auscultation bilaterally, no wheezing, rales or rhonchi. Abdomen: LUQ abd pain, nondistended, hypoactive BS no masses. Extremities: No clubbing, cyanosis or edema with positive pedal pulses. Neuro: Grossly intact, no focal neurological deficits, strength 5/5 upper and lower extremities bilaterally Psych: alert and oriented x 3, normal mood and affect Skin: no rashes or lesions, warm and dry   LABS on Admission:  Basic Metabolic Panel:  Lab [id number] 01/18/12 1645  NA 143 138  K 3.7 3.6  CL 104 99  CO2 -- 28  GLUCOSE 225* 226*  BUN 4* 6  CREATININE 0.80 0.62  CALCIUM -- 10.2  MG -- --  PHOS -- --   Liver Function Tests:  Lab 01/18/12 1645  AST 14   ALT 16  ALKPHOS 137*  BILITOT 0.4  PROT 8.7*  ALBUMIN 3.8    Lab 01/18/12 1645  LIPASE 26  AMYLASE --   No results found for this basename: AMMONIA:2 in the last 168 hours CBC:  Lab 01/18/12 1708 01/18/12 1645  WBC -- 10.5  NEUTROABS -- 8.2*  HGB 16.7* 13.7  HCT 49.0* 43.2  MCV -- 79.4  PLT -- 459*   CBG:  Lab 01/18/12 1831  GLUCAP 221*     Radiological Exams on Admission: Ct Head Wo Contrast  01/18/2012  *RADIOLOGY REPORT*  Clinical Data: Vomiting  CT HEAD WITHOUT CONTRAST  Technique:  Contiguous axial images were obtained from the base of the skull through the vertex without contrast.  Comparison: 01/23/2005.  Findings:  The brainstem and cerebellum appear unremarkable.  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  Exact age is indeterminate . No evidence of mass lesion, hemorrhage, hydrocephalus or extra-axial collection.  The calvarium is unremarkable.  Sinuses, middle ears and mastoids are clear.  IMPRESSION: 1 cm low density in the right frontal white matter likely to represent an  old infarction.  The exact age is indeterminate.  The remainder the study is normal.   Original Report Authenticated By: Jules Schick, M.D.     Assessment/Plan Present on Admission:  .HTN (hypertension), malignant  - There is unable to tolerate oral antihypertensives can be intractable nausea and vomiting, hence admit to step down for IV antihypertensives. Patient will be continued on Cardene drip to titrate for SBP between 150-60 and on to nausea and vomiting improves and patient is able to tolerate oral . - obtain MRI brain (CT head with 1cm density in right frontol white matter), r/o CVA. Will place on ASA suppository until CVA ruled out. If MRI positive, will initiate stroke work-up. .Nausea and vomiting: Unclear etiology possibly secondary to malignant hypertension, gastroenteritis,  rule out SBO. Lipase WNL.  - obtain portable KUB, CT abdomen and pelvis, C. difficile PCR, start on IV antiemetics, n.p.o. Status, IVF   .UTI (urinary tract infection) - obtain urine cultures, blood cultures, continue Rocephin   .DIABETES MELLITUS, NONINSULIN DEPENDENT (NIDDM) - obtain HbA1C, place on SSI  .GERD (gastroesophageal reflux disease) - place on IV PPI  .Hypercholesteremia - NPO for now  .Diarrhea -obtain CDIFF PCR and stool studies  DVT prophylaxis: lovenox  CODE STATUS: Full Code  Further plan will depend as patient's clinical course evolves and further radiologic and laboratory data become available.   Time Spent on Admission: 1 hour  RAI,RIPUDEEP M.D. [facility] 01/18/2012, 7:30 PM Pager: [phone]  If 7PM-7AM, please contact night-coverage [email or web address] Password [email or web address]

## 2012-01-18 NOTE — Progress Notes (Signed)
Subjective: Interval History: patient admitted from ED w/malignant HTN - maximum dose on Cardene gtt with SBP 190's and initially reporting chest pressure / pain 10 on 1-10 scale  Objective: Vital signs in last 24 hours: Temp:  [99.2 F (37.3 C)] 99.2 F (37.3 C) (09/14 1609) Pulse Rate:  [75-123] 123  (09/14 2115) Resp:  [12-29] 29  (09/14 2115) BP: (156-221)/(72-115) 184/77 mmHg (09/14 2115) SpO2:  [94 %-100 %] 94 % (09/14 2115) Weight:  [130.8 kg (288 lb 5.8 oz)-131.997 kg (291 lb)] 130.8 kg (288 lb 5.8 oz) (09/14 2000)  Intake/Output from previous day:   Intake/Output this shift:    BP 184/77  Pulse 123  Temp 99.2 F (37.3 C) (Oral)  Resp 29  Ht 5\' 9"  (1.753 m)  Wt 130.8 kg (288 lb 5.8 oz)  BMI 42.58 kg/m2  SpO2 94% General appearance: distracted, fatigued and moderate distress Lungs: clear to auscultation bilaterally Heart: sinus tachycardia - rate 130's  Results for orders placed during the hospital encounter of 01/18/12 (from the past 24 hour(s))  CBC WITH DIFFERENTIAL     Status: Abnormal   Collection Time   01/18/12  4:45 PM      Component Value Range   WBC 10.5  4.0 - 10.5 K/uL   RBC 5.44 (*) 3.87 - 5.11 MIL/uL   Hemoglobin 13.7  12.0 - 15.0 g/dL   HCT 43.2  36.0 - 46.0 %   MCV 79.4  78.0 - 100.0 fL   MCH 25.2 (*) 26.0 - 34.0 pg   MCHC 31.7  30.0 - 36.0 g/dL   RDW 14.8  11.5 - 15.5 %   Platelets 459 (*) 150 - 400 K/uL   Neutrophils Relative 79 (*) 43 - 77 %   Neutro Abs 8.2 (*) 1.7 - 7.7 K/uL   Lymphocytes Relative 17  12 - 46 %   Lymphs Abs 1.8  0.7 - 4.0 K/uL   Monocytes Relative 3  3 - 12 %   Monocytes Absolute 0.3  0.1 - 1.0 K/uL   Eosinophils Relative 1  0 - 5 %   Eosinophils Absolute 0.1  0.0 - 0.7 K/uL   Basophils Relative 0  0 - 1 %   Basophils Absolute 0.0  0.0 - 0.1 K/uL  COMPREHENSIVE METABOLIC PANEL     Status: Abnormal   Collection Time   01/18/12  4:45 PM      Component Value Range   Sodium 138  135 - 145 mEq/L   Potassium 3.6  3.5 -  5.1 mEq/L   Chloride 99  96 - 112 mEq/L   CO2 28  19 - 32 mEq/L   Glucose, Bld 226 (*) 70 - 99 mg/dL   BUN 6  6 - 23 mg/dL   Creatinine, Ser 0.62  0.50 - 1.10 mg/dL   Calcium 10.2  8.4 - 10.5 mg/dL   Total Protein 8.7 (*) 6.0 - 8.3 g/dL   Albumin 3.8  3.5 - 5.2 g/dL   AST 14  0 - 37 U/L   ALT 16  0 - 35 U/L   Alkaline Phosphatase 137 (*) 39 - 117 U/L   Total Bilirubin 0.4  0.3 - 1.2 mg/dL   GFR calc non Af Amer >90  >90 mL/min   GFR calc Af Amer >90  >90 mL/min  LIPASE, BLOOD     Status: Normal   Collection Time   01/18/12  4:45 PM      Component Value Range  Lipase 26  11 - 59 U/L  POCT I-STAT, CHEM 8     Status: Abnormal   Collection Time   01/18/12  5:08 PM      Component Value Range   Sodium 143  135 - 145 mEq/L   Potassium 3.7  3.5 - 5.1 mEq/L   Chloride 104  96 - 112 mEq/L   BUN 4 (*) 6 - 23 mg/dL   Creatinine, Ser 0.80  0.50 - 1.10 mg/dL   Glucose, Bld 225 (*) 70 - 99 mg/dL   Calcium, Ion 1.16  1.12 - 1.23 mmol/L   TCO2 26  0 - 100 mmol/L   Hemoglobin 16.7 (*) 12.0 - 15.0 g/dL   HCT 49.0 (*) 36.0 - 46.0 %  POCT I-STAT TROPONIN I     Status: Normal   Collection Time   01/18/12  5:09 PM      Component Value Range   Troponin i, poc 0.00  0.00 - 0.08 ng/mL   Comment 3           URINALYSIS, ROUTINE W REFLEX MICROSCOPIC     Status: Abnormal   Collection Time   01/18/12  5:28 PM      Component Value Range   Color, Urine YELLOW  YELLOW   APPearance CLOUDY (*) CLEAR   Specific Gravity, Urine 1.019  1.005 - 1.030   pH 7.5  5.0 - 8.0   Glucose, UA 100 (*) NEGATIVE mg/dL   Hgb urine dipstick SMALL (*) NEGATIVE   Bilirubin Urine NEGATIVE  NEGATIVE   Ketones, ur 15 (*) NEGATIVE mg/dL   Protein, ur >300 (*) NEGATIVE mg/dL   Urobilinogen, UA 1.0  0.0 - 1.0 mg/dL   Nitrite POSITIVE (*) NEGATIVE   Leukocytes, UA NEGATIVE  NEGATIVE  URINE MICROSCOPIC-ADD ON     Status: Abnormal   Collection Time   01/18/12  5:28 PM      Component Value Range   Squamous Epithelial / LPF  RARE  RARE   WBC, UA 3-6  <3 WBC/hpf   RBC / HPF 0-2  <3 RBC/hpf   Bacteria, UA MANY (*) RARE  GLUCOSE, CAPILLARY     Status: Abnormal   Collection Time   01/18/12  6:31 PM      Component Value Range   Glucose-Capillary 221 (*) 70 - 99 mg/dL   Comment 1 Notify RN    TROPONIN I     Status: Normal   Collection Time   01/18/12  7:43 PM      Component Value Range   Troponin I <0.30  <0.30 ng/mL  CK TOTAL AND CKMB     Status: Normal   Collection Time   01/18/12  7:43 PM      Component Value Range   Total CK 85  7 - 177 U/L   CK, MB 2.3  0.3 - 4.0 ng/mL   Relative Index RELATIVE INDEX IS INVALID  0.0 - 2.5  LIPASE, BLOOD     Status: Normal   Collection Time   01/18/12  7:43 PM      Component Value Range   Lipase 26  11 - 59 U/L  AMYLASE     Status: Abnormal   Collection Time   01/18/12  7:43 PM      Component Value Range   Amylase 124 (*) 0 - 105 U/L    Studies/Results: Dg Abd 1 View  01/18/2012  *RADIOLOGY REPORT*  Clinical Data: Abdominal pelvic pain with nausea/vomiting.  ABDOMEN - 1 VIEW  Comparison: 01/22/2006 radiographs  Findings: The bowel gas pattern is unremarkable. There is no evidence of bowel obstruction. No suspicious calcifications are identified. Cholecystectomy clips are present. No acute bony abnormalities are identified.  IMPRESSION: No acute or significant abnormalities identified.   Original Report Authenticated By: Lura Em, M.D.    Ct Head Wo Contrast  01/18/2012  *RADIOLOGY REPORT*  Clinical Data: Vomiting  CT HEAD WITHOUT CONTRAST  Technique:  Contiguous axial images were obtained from the base of the skull through the vertex without contrast.  Comparison: 01/23/2005.  Findings:  The brainstem and cerebellum appear unremarkable.  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  Exact age is indeterminate . No evidence of mass lesion, hemorrhage, hydrocephalus or  extra-axial collection.  The calvarium is unremarkable.  Sinuses, middle ears and mastoids are clear.  IMPRESSION: 1 cm low density in the right frontal white matter likely to represent an old infarction.  The exact age is indeterminate.  The remainder the study is normal.   Original Report Authenticated By: Jules Schick, M.D.     Scheduled Meds:   . aspirin  325 mg Oral Once  . cefTRIAXone (ROCEPHIN)  IV  1 g Intravenous Once  . cefTRIAXone (ROCEPHIN)  IV  1 g Intravenous Q24H  . enoxaparin (LOVENOX) injection  65 mg Subcutaneous Q24H  . insulin aspart  0-9 Units Subcutaneous Q4H  . labetalol  20 mg Intravenous Once  .  morphine injection  2 mg Intravenous Once  . niCARDipine  5 mg/hr Intravenous Once  . ondansetron      . ondansetron (ZOFRAN) IV  4 mg Intravenous Once  . pantoprazole (PROTONIX) IV  40 mg Intravenous Q12H  . promethazine  12.5 mg Intravenous Once  . sodium chloride  1,000 mL Intravenous Once  . DISCONTD: aspirin  300 mg Rectal Once   Continuous Infusions:   . sodium chloride    . nitroGLYCERIN    . DISCONTD: niCARDipine     PRN Meds:acetaminophen, acetaminophen, alum & mag hydroxide-simeth, HYDROmorphone (DILAUDID) injection, nitroGLYCERIN, ondansetron (ZOFRAN) IV, ondansetron, promethazine, DISCONTD: ondansetron (ZOFRAN) IV  Assessment/Plan: Chest pain. 12-Lead EKG with ST changes - Dilaudid given and Ntg x 3 with improvement/decrease in pain to 2 on 1-10 pain scale. Contacted [facility] who agreed with changing to nitroglycerin gtt. Gave ASA. Cardiology consult called.   LOS: 0 days   Endy Easterly A.

## 2012-01-18 NOTE — ED Notes (Signed)
Pt going to CT at this time, will get EKG upon arrival back to room.

## 2012-01-19 ENCOUNTER — Inpatient Hospital Stay (HOSPITAL_COMMUNITY): Payer: Medicaid Other

## 2012-01-19 DIAGNOSIS — R112 Nausea with vomiting, unspecified: Secondary | ICD-10-CM

## 2012-01-19 DIAGNOSIS — I10 Essential (primary) hypertension: Secondary | ICD-10-CM

## 2012-01-19 LAB — T4, FREE: Free T4: 1.28 ng/dL (ref 0.80–1.80)

## 2012-01-19 LAB — CBC
HCT: 43.1 % (ref 36.0–46.0)
Hemoglobin: 14.8 g/dL (ref 12.0–15.0)
MCV: 79.2 fL (ref 78.0–100.0)
WBC: 17.1 10*3/uL — ABNORMAL HIGH (ref 4.0–10.5)

## 2012-01-19 LAB — BASIC METABOLIC PANEL
BUN: 6 mg/dL (ref 6–23)
CO2: 22 mEq/L (ref 19–32)
Chloride: 98 mEq/L (ref 96–112)
Creatinine, Ser: 0.49 mg/dL — ABNORMAL LOW (ref 0.50–1.10)
GFR calc Af Amer: >90 mL/min (ref >90)
Potassium: 3.2 mEq/L — ABNORMAL LOW (ref 3.5–5.1)

## 2012-01-19 LAB — CK TOTAL AND CKMB (NOT AT ARMC)
CK, MB: 3.7 ng/mL (ref 0.3–4.0)
Relative Index: INVALID (ref 0.0–2.5)
Total CK: 70 U/L (ref 7–177)
Total CK: 56 U/L (ref 7–177)

## 2012-01-19 LAB — HEMOGLOBIN A1C: Mean Plasma Glucose: 235 mg/dL — ABNORMAL HIGH (ref <117)

## 2012-01-19 LAB — GLUCOSE, CAPILLARY
Glucose-Capillary: 238 mg/dL — ABNORMAL HIGH (ref 70–99)
Glucose-Capillary: 279 mg/dL — ABNORMAL HIGH (ref 70–99)
Glucose-Capillary: 232 mg/dL — ABNORMAL HIGH (ref 70–99)
Glucose-Capillary: 226 mg/dL — ABNORMAL HIGH (ref 70–99)

## 2012-01-19 LAB — TROPONIN I
Troponin I: <0.3 ng/mL (ref <0.30)
Troponin I: <0.3 ng/mL (ref <0.30)

## 2012-01-19 LAB — LIPID PANEL
LDL Cholesterol: 148 mg/dL — ABNORMAL HIGH (ref 0–99)
Total CHOL/HDL Ratio: 4.3 RATIO
VLDL: 19 mg/dL (ref 0–40)

## 2012-01-19 LAB — LACTIC ACID, PLASMA: Lactic Acid, Venous: 1 mmol/L (ref 0.5–2.2)

## 2012-01-19 LAB — HEPARIN LEVEL (UNFRACTIONATED): Heparin Unfractionated: 0.24 IU/mL — ABNORMAL LOW (ref 0.30–0.70)

## 2012-01-19 LAB — TSH: TSH: 0.359 u[IU]/mL (ref 0.350–4.500)

## 2012-01-19 IMAGING — CT CT ABD-PELV W/ CM
2 of 5 series · 17 of 46 positions shown, 19 images · IV contrast (agent unspecified)
Comparison: [DATE] radiograph, [DATE] CT

CLINICAL DATA: Nausea, vomiting.

CT ABDOMEN AND PELVIS WITH CONTRAST
TECHNIQUE: Multidetector CT imaging of the abdomen and pelvis was
performed following the standard protocol during bolus
administration of intravenous contrast.
Contrast:  100 ml [SW]

[Series 2: rtn a/p with · axial · 0.93mm/px · z∈[-521,-76]mm · 14 of 99 slices shown, 16 images]
[im 5/99  soft-tissue]
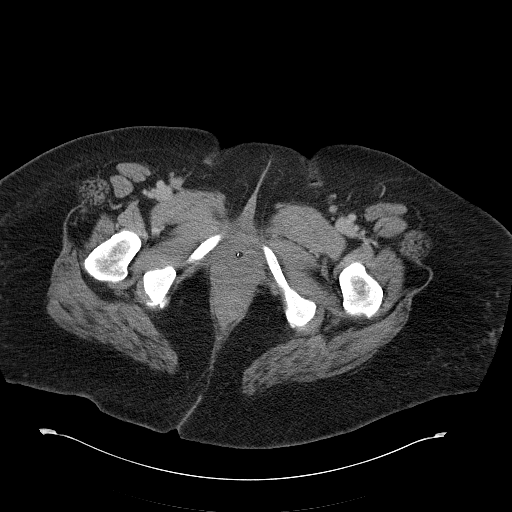
[im 5/99  bone]
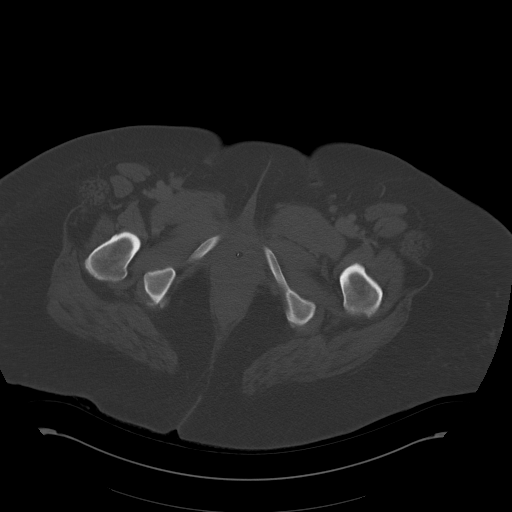
[im 15/99  soft-tissue]
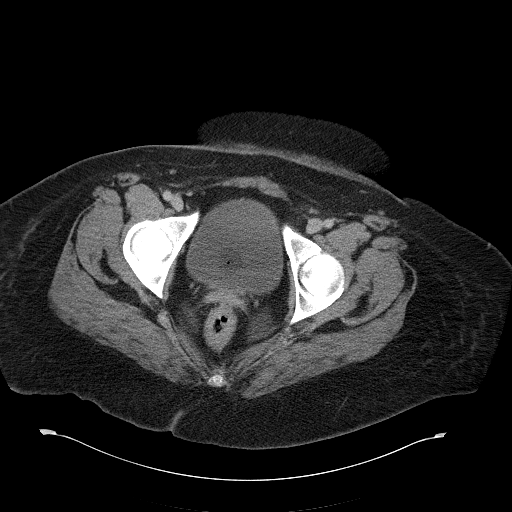
[im 20/99  soft-tissue]
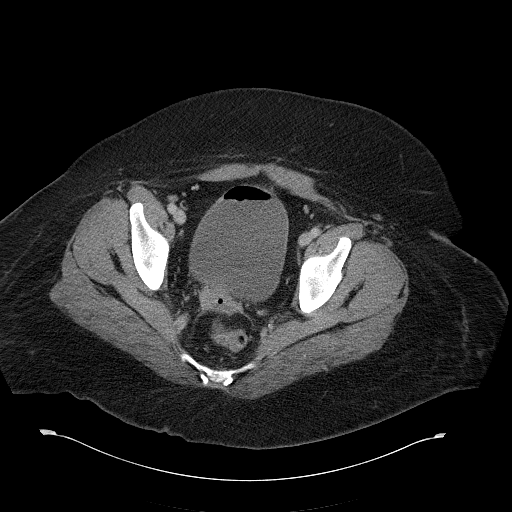
[im 25/99  soft-tissue]
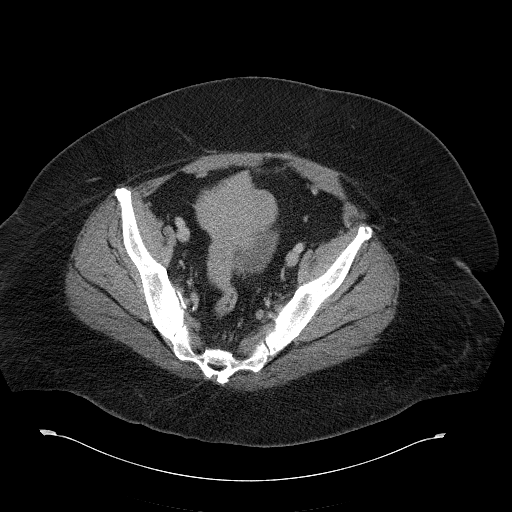
[im 35/99  soft-tissue]
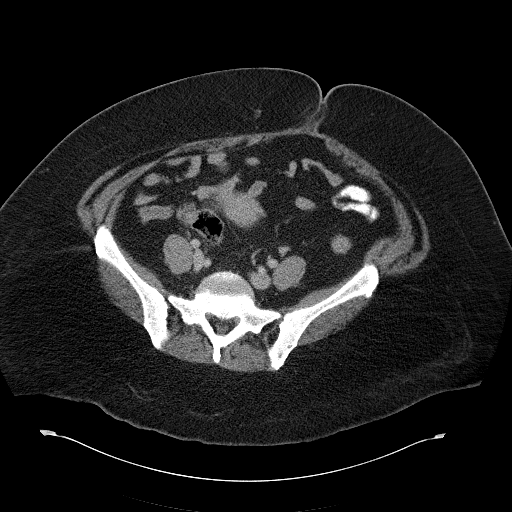
[im 40/99  soft-tissue]
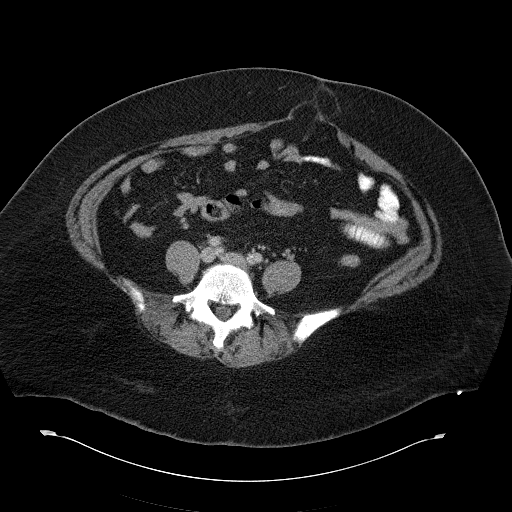
[im 45/99  soft-tissue]
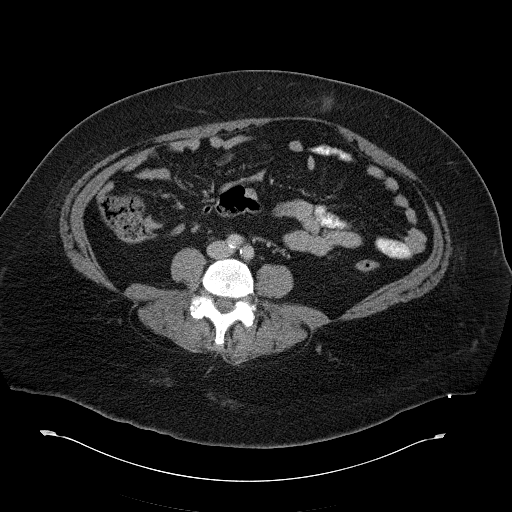
[im 54/99  soft-tissue]
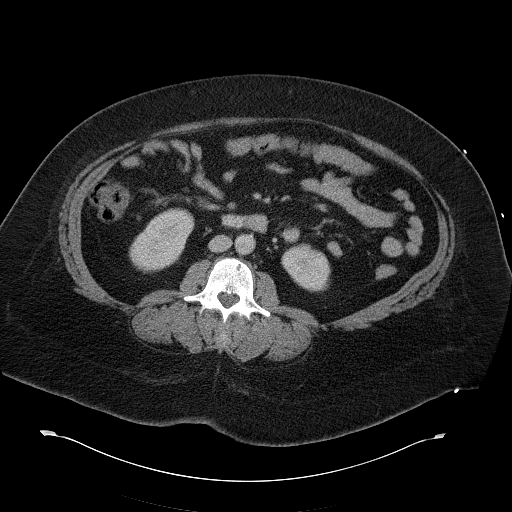
[im 59/99  soft-tissue]
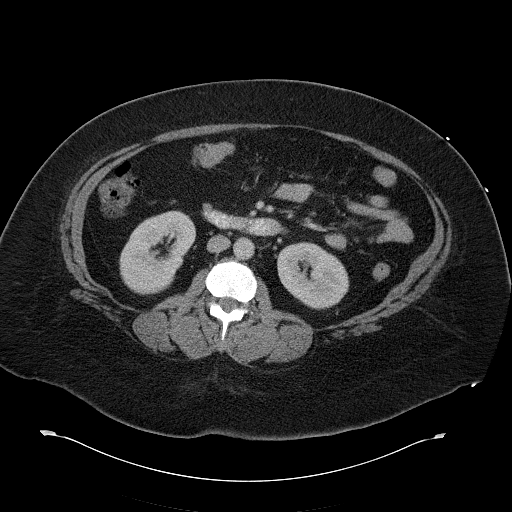
[im 59/99  bone]
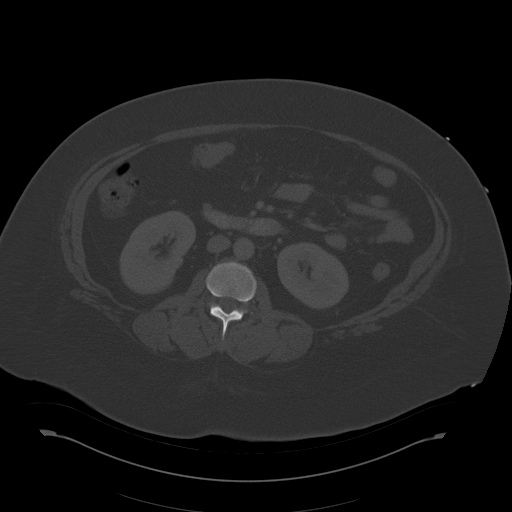
[im 64/99  soft-tissue]
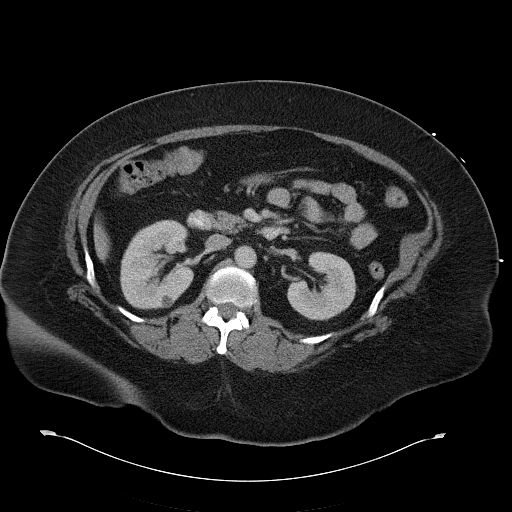
[im 74/99  soft-tissue]
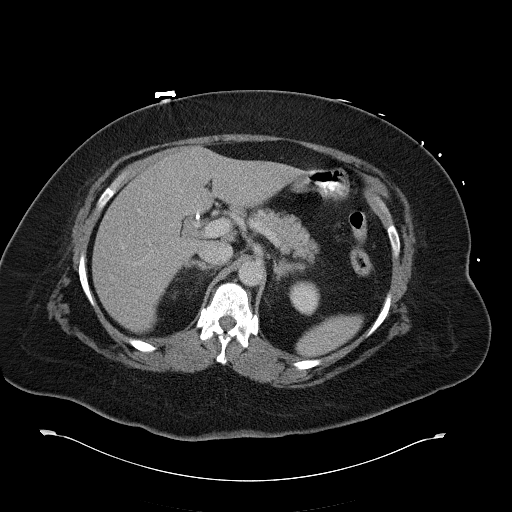
[im 79/99  soft-tissue]
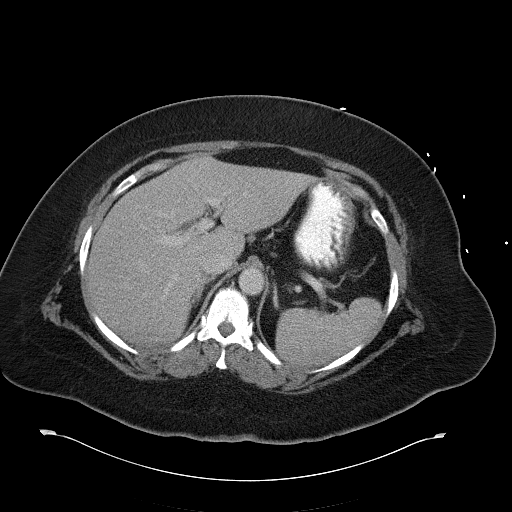
[im 84/99  soft-tissue]
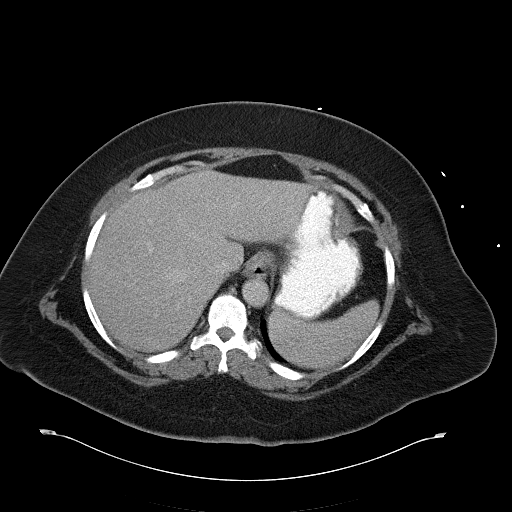
[im 94/99  soft-tissue]
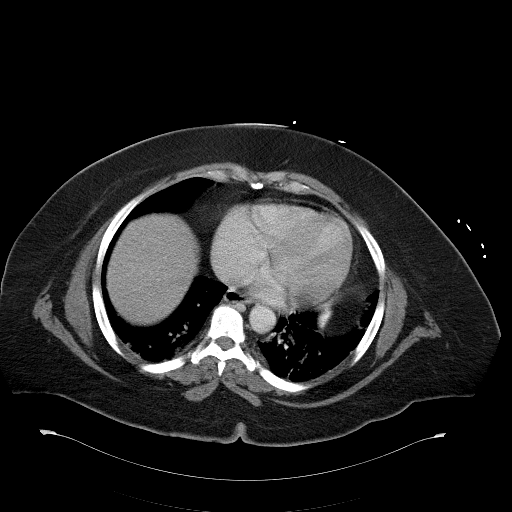

[Series 602: coronal abdomen · coronal · 0.96mm/px · 3 of 164 slices shown]
[im 55/164  soft-tissue]
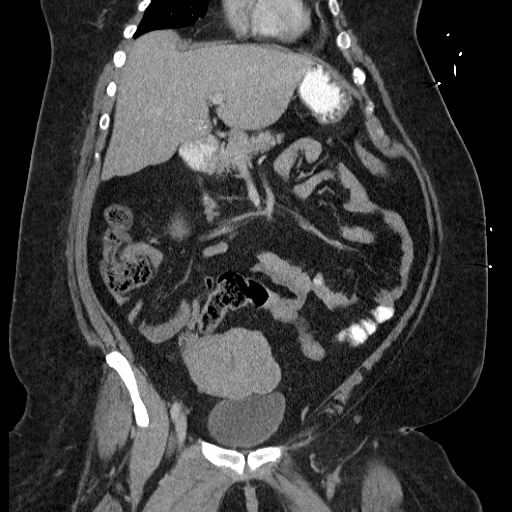
[im 73/164  soft-tissue]
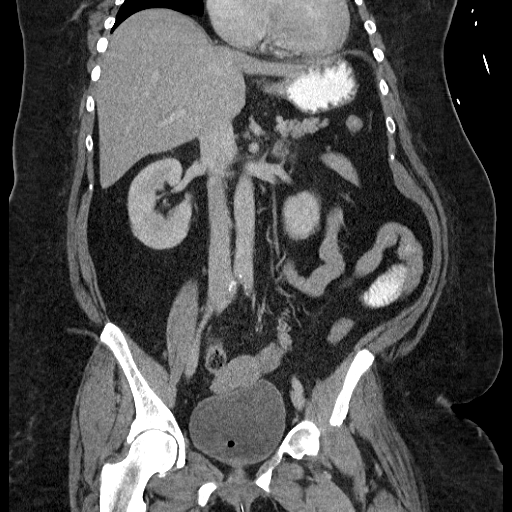
[im 91/164  soft-tissue]
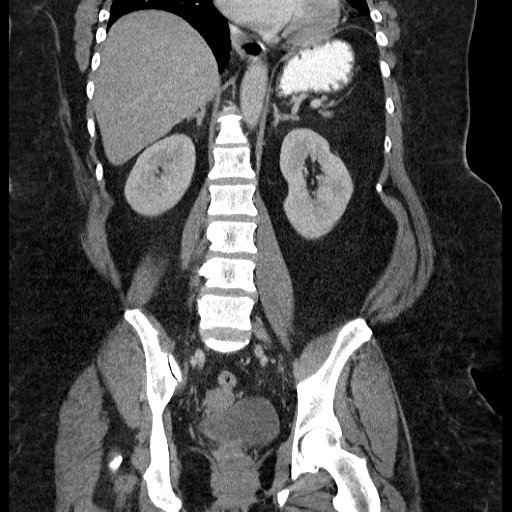

[17 of 46 positions shown; findings below may reference images not displayed]

FINDINGS: Heterogeneous attenuation of a right lower lobe pulmonary
vessel.  I cannot confirm on this study, however, am concerned this
may represent a pulmonary embolism.  Mild bibasilar opacities,
likely scarring or atelectasis.  Heart size upper normal to mildly
enlarged.

Low attenuation of the liver suggests fatty infiltration.  Absent
gallbladder.  Unremarkable spleen, pancreas, adrenal glands.  No
biliary ductal dilatation.

Bilateral renal hypodensities are incompletely characterized.  No
hydronephrosis or hydroureter.  Urinary tract calculi visualized.

No bowel obstruction.  No CT evidence for colitis.  Normal
appendix.  No free intraperitoneal air or fluid.

Normal caliber vasculature with scattered atherosclerosis of the
aorta and branch vessels.

Anterior abdominal wall fat containing periumbilical hernia.  No CT
evidence for incarceration.

Air non dependently within the bladder.  A Foley catheter is in
place.  The uterus is lobular in contour.  No adnexal mass
visualized.

Multilevel degenerative change.  No acute osseous finding.
IMPRESSION: No acute intra-abdominal process.

Questionable filling defect within a right lower lobe pulmonary
branch.  Recommend PE protocol chest CT. Discussed via telephone
with Dr. HON KWONG at [DATE] on [DATE].

## 2012-01-19 IMAGING — CT CT ANGIO CHEST
2 of 6 series · 19 of 36 positions shown · IV contrast (OMNIPAQUE)
Comparison: [DATE]

CLINICAL DATA: Questionable pulmonary embolism on abdominal CT.

CT ANGIOGRAPHY CHEST
TECHNIQUE: Multidetector CT imaging of the chest using the
standard protocol during bolus administration of intravenous
contrast. Multiplanar reconstructed images including MIPs were
obtained and reviewed to evaluate the vascular anatomy.
Contrast: 100mL OMNIPAQUE IOHEXOL 350 MG/ML SOLN

[Series 6: thins · axial · 0.70mm/px · z∈[-308,-108]mm · 18 of 224 slices shown]
[im 12/224  lung]
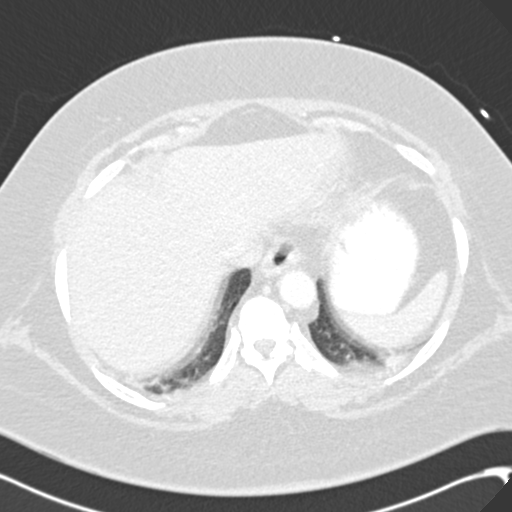
[im 23/224  mediastinal]
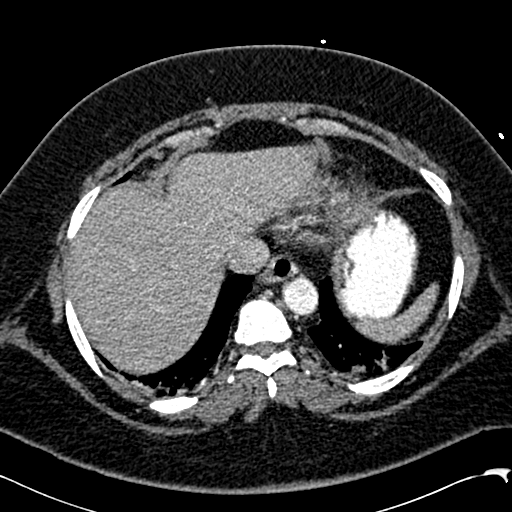
[im 34/224  lung]
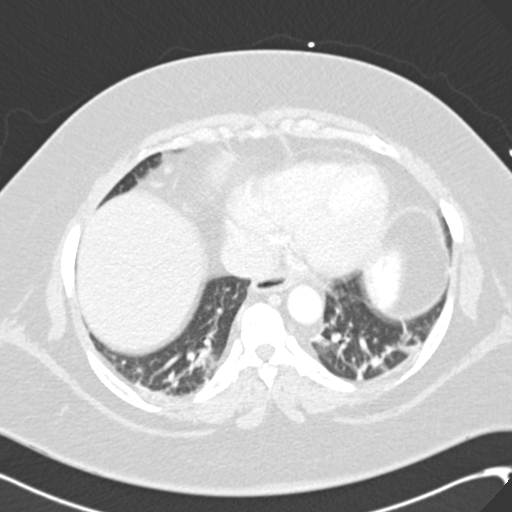
[im 45/224  mediastinal]
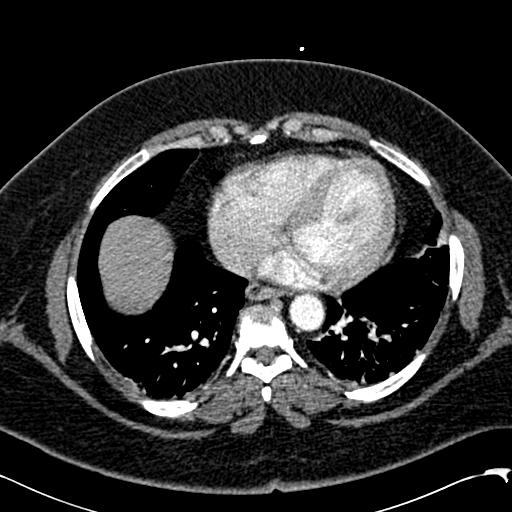
[im 56/224  lung]
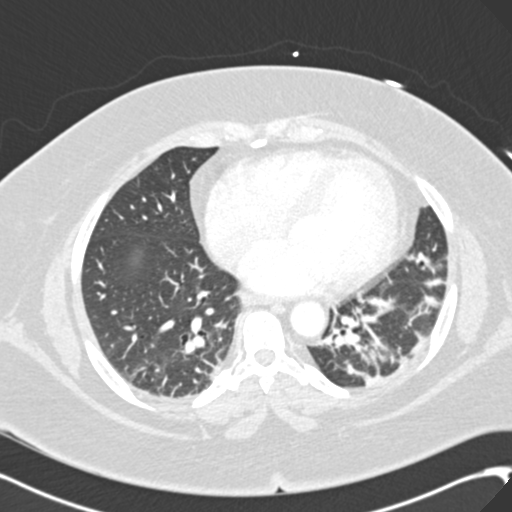
[im 67/224  mediastinal]
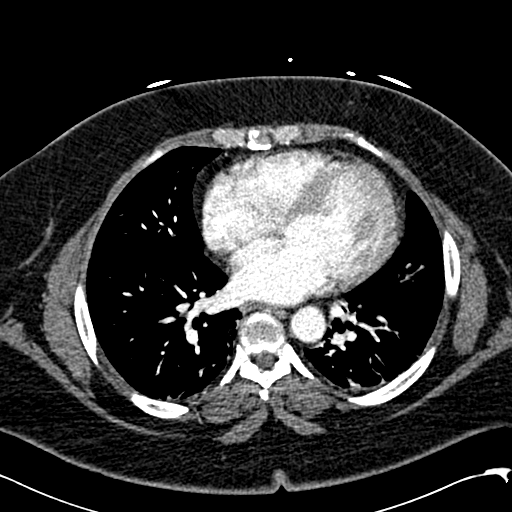
[im 79/224  lung]
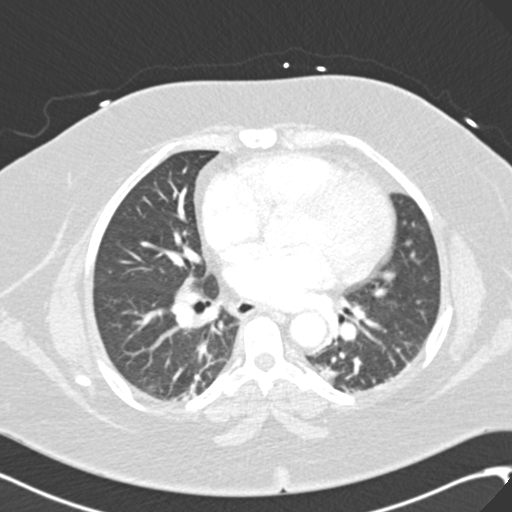
[im 90/224  mediastinal]
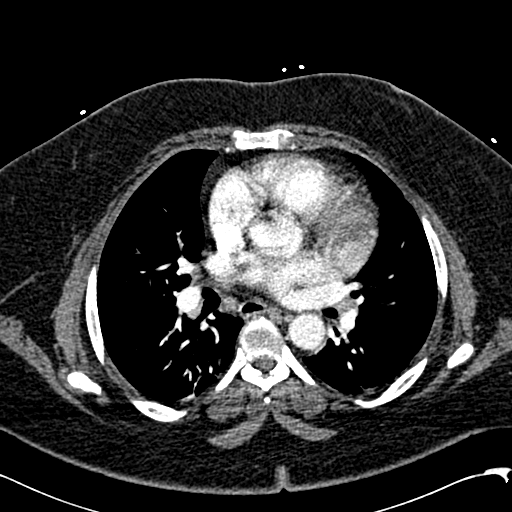
[im 101/224  lung]
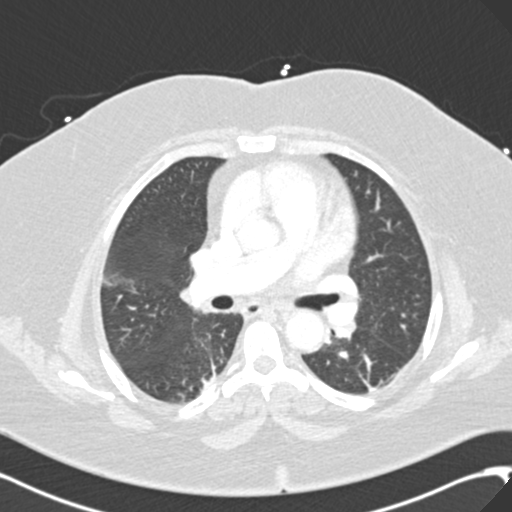
[im 123/224  mediastinal]
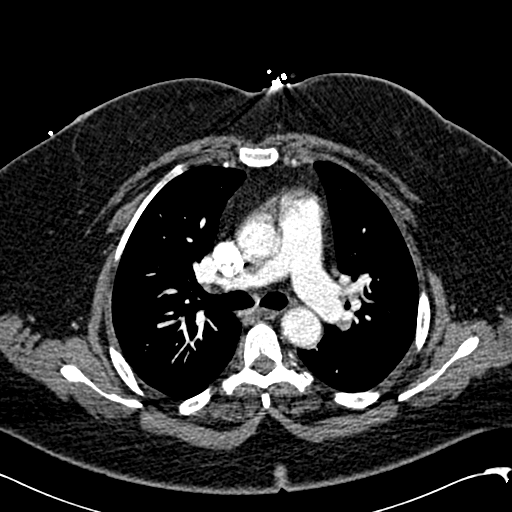
[im 134/224  lung]
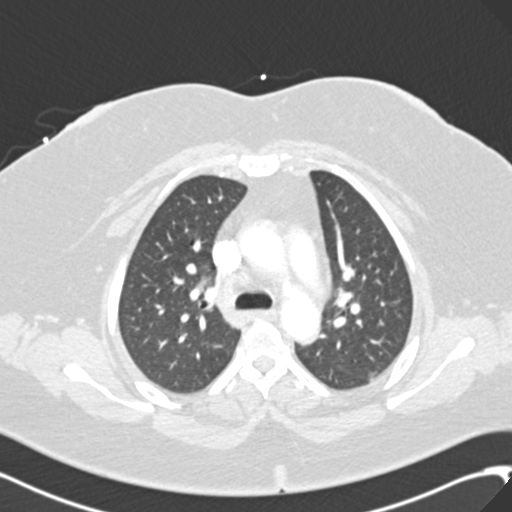
[im 145/224  mediastinal]
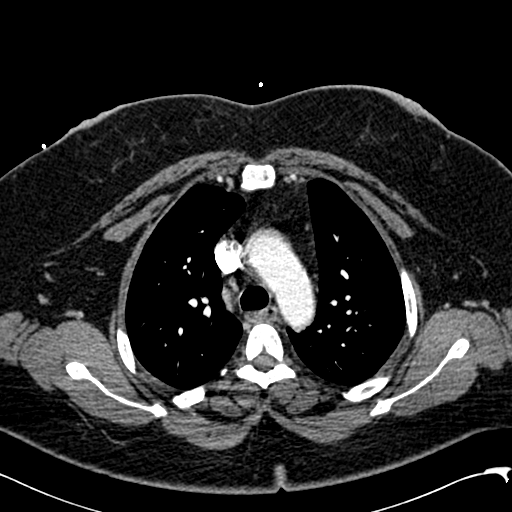
[im 157/224  lung]
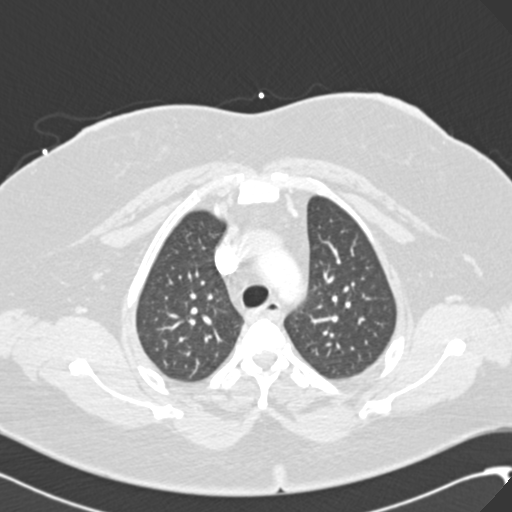
[im 168/224  mediastinal]
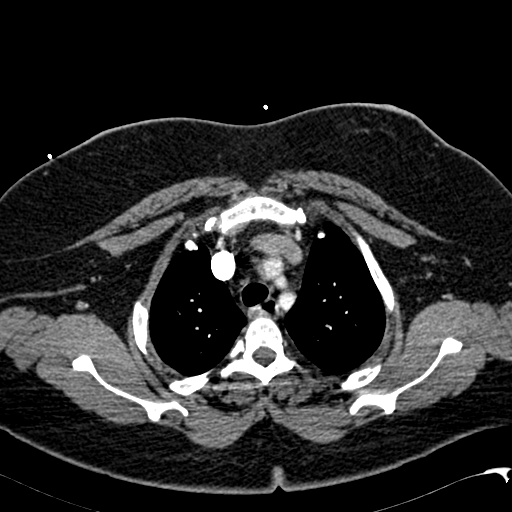
[im 179/224  lung]
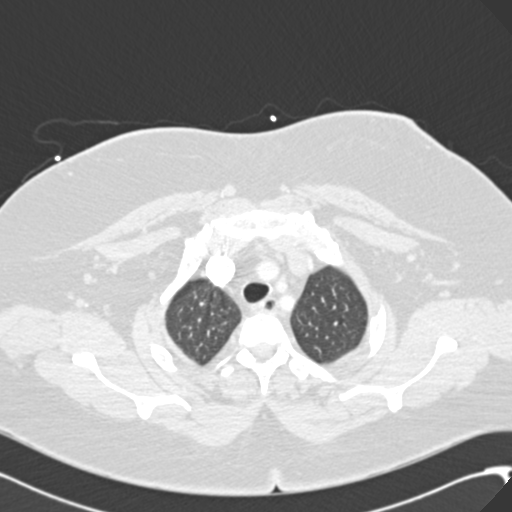
[im 190/224  mediastinal]
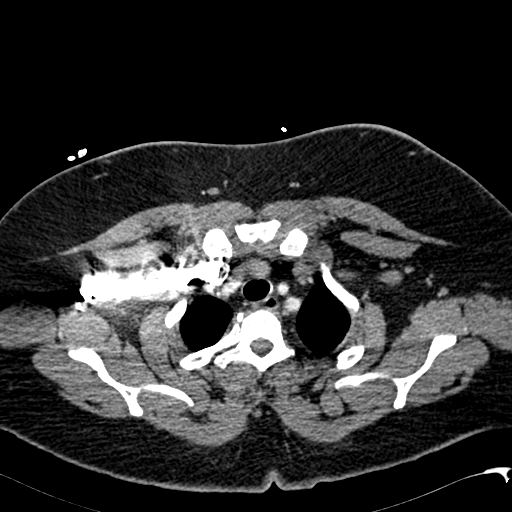
[im 201/224  lung]
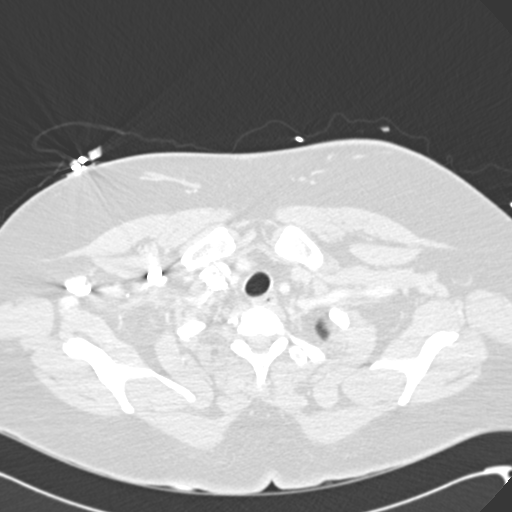
[im 212/224  mediastinal]
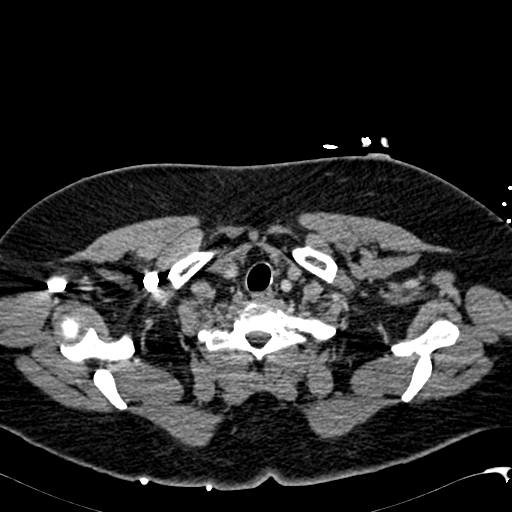

[Series 602: coronal chest · coronal · 0.70mm/px · 1 of 114 slices shown]
[im 57/114  mediastinal]
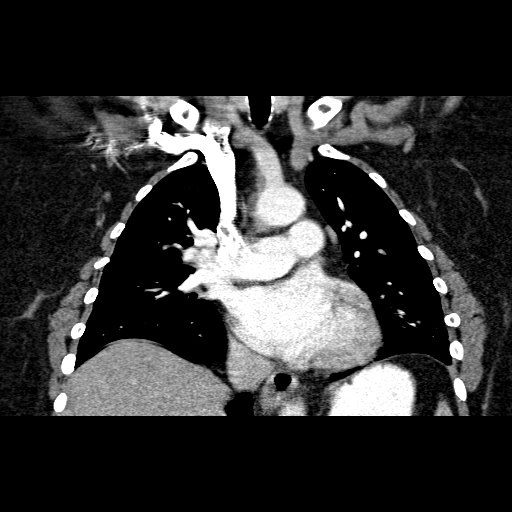

[19 of 36 positions shown; findings below may reference images not displayed]

FINDINGS: Suboptimal contrast bolus timing.  Allowing for this, no
pulmonary arterial branch filling defect is identified.  Heart size
upper normal to mildly enlarged.  No pleural or pericardial
effusion.  No intrathoracic lymphadenopathy.  Scattered
atherosclerosis of the aorta.  No aneurysmal dilatation.

Central airways are patent.  Mild dependent opacities bilaterally,
likely scarring or atelectasis.  No pneumothorax.

Limited images through the upper abdomen show no acute finding.

No acute osseous finding.
IMPRESSION: No pulmonary arterial filling defect identified. The vessel
questioned on abdominal CT may have corresponded to mixing artifact
as I do not see a filling defect here on the current examination.

No acute intrathoracic process.

## 2012-01-19 MED ORDER — POTASSIUM CHLORIDE ER 10 MEQ PO TBCR
20.0000 meq | EXTENDED_RELEASE_TABLET | Freq: Two times a day (BID) | ORAL | Status: DC
  Filled 2012-01-19 (×2): qty 2

## 2012-01-19 MED ORDER — CLONIDINE HCL 0.3 MG PO TABS
0.3000 mg | ORAL_TABLET | Freq: Two times a day (BID) | ORAL | Status: DC
  Administered 2012-01-19 – 2012-01-21 (×5): 0.3 mg via ORAL
  Filled 2012-01-19 (×6): qty 1

## 2012-01-19 MED ORDER — CLONIDINE HCL 0.2 MG/24HR TD PTWK
0.2000 mg | MEDICATED_PATCH | TRANSDERMAL | Status: DC
  Filled 2012-01-19: qty 1

## 2012-01-19 MED ORDER — METOPROLOL TARTRATE 25 MG PO TABS: 25.0000 mg | ORAL_TABLET | Freq: Two times a day (BID) | ORAL | Status: DC

## 2012-01-19 MED ORDER — INSULIN GLARGINE 100 UNIT/ML ~~LOC~~ SOLN
10.0000 [IU] | Freq: Every day | SUBCUTANEOUS | Status: DC
  Administered 2012-01-19 – 2012-01-21 (×3): 10 [IU] via SUBCUTANEOUS

## 2012-01-19 MED ORDER — CARVEDILOL 25 MG PO TABS
25.0000 mg | ORAL_TABLET | Freq: Two times a day (BID) | ORAL | Status: DC
  Administered 2012-01-20 – 2012-01-21 (×3): 25 mg via ORAL
  Filled 2012-01-19 (×7): qty 1

## 2012-01-19 MED ORDER — LOSARTAN POTASSIUM 25 MG PO TABS
25.0000 mg | ORAL_TABLET | Freq: Every day | ORAL | Status: DC
  Administered 2012-01-19 – 2012-01-21 (×3): 25 mg via ORAL
  Filled 2012-01-19 (×3): qty 1

## 2012-01-19 MED ORDER — POTASSIUM CHLORIDE CRYS ER 20 MEQ PO TBCR
20.0000 meq | EXTENDED_RELEASE_TABLET | Freq: Two times a day (BID) | ORAL | Status: DC
  Administered 2012-01-19 – 2012-01-21 (×5): 20 meq via ORAL
  Filled 2012-01-19 (×4): qty 1

## 2012-01-19 MED ORDER — POTASSIUM CHLORIDE IN NACL 20-0.9 MEQ/L-% IV SOLN: INTRAVENOUS | Status: DC

## 2012-01-19 MED ORDER — METOPROLOL TARTRATE 1 MG/ML IV SOLN
5.0000 mg | Freq: Once | INTRAVENOUS | Status: AC
  Administered 2012-01-19: 5 mg via INTRAVENOUS
  Filled 2012-01-19: qty 5

## 2012-01-19 MED ORDER — NITROGLYCERIN IN D5W 200-5 MCG/ML-% IV SOLN: 2.0000 ug/min | INTRAVENOUS | Status: DC

## 2012-01-19 MED ORDER — AMLODIPINE BESYLATE 10 MG PO TABS
10.0000 mg | ORAL_TABLET | Freq: Every day | ORAL | Status: DC
  Administered 2012-01-19: 10 mg via ORAL
  Filled 2012-01-19 (×2): qty 1

## 2012-01-19 MED ORDER — IOHEXOL 300 MG/ML  SOLN: 100.0000 mL | Freq: Once | INTRAMUSCULAR | Status: AC | PRN

## 2012-01-19 MED ORDER — DILTIAZEM HCL 25 MG/5ML IV SOLN
10.0000 mg | Freq: Once | INTRAVENOUS | Status: DC
  Filled 2012-01-19: qty 5

## 2012-01-19 MED ORDER — ENOXAPARIN SODIUM 40 MG/0.4ML ~~LOC~~ SOLN
40.0000 mg | SUBCUTANEOUS | Status: DC
  Administered 2012-01-19 – 2012-01-21 (×3): 40 mg via SUBCUTANEOUS
  Filled 2012-01-19 (×3): qty 0.4

## 2012-01-19 MED ORDER — IOHEXOL 350 MG/ML SOLN
100.0000 mL | Freq: Once | INTRAVENOUS | Status: AC | PRN
  Administered 2012-01-19: 100 mL via INTRAVENOUS

## 2012-01-19 MED ORDER — NICARDIPINE HCL IN NACL 20-0.86 MG/200ML-% IV SOLN
5.0000 mg/h | INTRAVENOUS | Status: DC
  Administered 2012-01-19 (×2): 5 mg/h via INTRAVENOUS
  Filled 2012-01-19 (×5): qty 200

## 2012-01-19 NOTE — Consult Note (Signed)
Name: [name] MRN: [id number] DOB: [birth year]    LOS: 1  PULMONARY / CRITICAL CARE MEDICINE  HPI:  [name] is a 51 year old lady who presented to the emergency department today following several hours of nausea and vomiting and chest pressure. This started at 10 AM this morning. She actually threw up, had no prodromal symptoms. Since then she's been describing pressure in her left lower chest versus left upper quadrant of her abdomen. She reports that this does not radiate, although her participation in my exam is limited. She has received Dilaudid for the chest pressure. Her blood pressures were quite elevated and this was not responding to nicardipine drip; she was transitioned to nitroglycerin drip, after 3 nitroglycerin dropped her pain from a 10 to a 2. However she continues to have some chest pressure.  Current : c/o chest pain & LUQ pain 2/10, denies dyspnea, headache better (from SL NTG)  Vital Signs: Temp:  [98.4 F (36.9 C)-99.3 F (37.4 C)] 98.4 F (36.9 C) (09/15 0930) Pulse Rate:  [75-131] 120  (09/15 0930) Resp:  [12-37] 27  (09/15 0930) BP: (130-221)/(54-115) 148/66 mmHg (09/15 0930) SpO2:  [94 %-100 %] 98 % (09/15 0930) Weight:  [130.8 kg (288 lb 5.8 oz)-131.997 kg (291 lb)] 130.8 kg (288 lb 5.8 oz) (09/14 2000)  Physical Examination: General:  Somewhat sleepy ,interactive. Neuro: non focal HEENT:  PERRL, pink conjunctivae, moist membranes Neck:  Supple, no JVD   Cardiovascular:  RRR, no M/R/G Lungs:  Bilateral diminished air entry, no W/R/R Abdomen:  Soft, appears tender in the left upper quadrant with no tenderness in any other quadrant, nondistended, bowel sounds present Musculoskeletal:  Moves all extremities, no pedal edema Skin:  No rash  Principal Problem:  *HTN (hypertension), malignant Active Problems:  DIABETES MELLITUS, NONINSULIN DEPENDENT (NIDDM)  Hypercholesteremia  GERD (gastroesophageal reflux disease)  Nausea and vomiting  UTI (urinary  tract infection)  Diarrhea   ASSESSMENT AND PLAN  CARDIOVASCULAR  Lab 01/19/12 0805 01/19/12 0129 01/18/12 2321 01/18/12 1943  TROPONINI <0.30 <0.30 -- <0.30  LATICACIDVEN -- -- 1.0 --  PROBNP -- -- -- --   ECG:  ST depressions in the inferolateral leads, new from earlier today Lines: PIV  A: Chest pain or pressure with ST changes, hypertensive crisis- reproducible ? Musculoskeletal vs GI Troponin negative . ECG abnormality with ST depression  Hypertensive urgency  P:  Echo, cards following Add po clonidine, losartan (home meds), amlodipin Dc NTG drip once PO doses given Dc cardene Replete K  GASTROINTESTINAL  Lab 01/18/12 1645  AST 14  ALT 16  ALKPHOS 137*  BILITOT 0.4  PROT 8.7*  ALBUMIN 3.8    A:  Nausea, vomiting, and diarrhea P:   C. differential is pending though her normal white count would argue against this. CT abdomen and pelvis neg for ischemic bowel.She does also have a very slight gamma gap with an elevated total protein but a normal albumin. ? hypergammaglobulinemia.  INFECTIOUS  Lab 01/19/12 0339 01/18/12 1645  WBC 17.1* 10.5  PROCALCITON -- --   Cultures: None Antibiotics: Ceftriaxone 1 g every 24  A:  Possible UTI P:   Patient's UA was mildly dirty with many bacteria. An infected renal stone could certainly be causing some of her symptoms as well however again a normal white count and being particularly hypertensive with potentially argue against this.  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 [name].  Pulmonary & Critical care Pager [phone] If no response call [phone]    01/19/2012, 10:30 AM

## 2012-01-19 NOTE — Progress Notes (Signed)
[facility] Progress Note Patient Name: [name] DOB: 07-24-1960 MRN: [id number]  Date of Service  01/19/2012   HPI/Events of Note  Nurse concerned about HTN and tacycardia. Pt. Earlier admitted for HTN urgency with chest pain. Trop neg. Some ST-T wave changes. Seen by cardiology.  eICU Interventions  Will use Metoprolol to bring down the HR and BP. Cont. NTG drip.      Emelda Brothers 01/19/2012, 1:57 AM

## 2012-01-19 NOTE — Progress Notes (Signed)
[facility] PROGRESS NOTE  [name] [id number] DOB: Mar 10, 1961 DOA: 01/18/2012 PCP: Donnamae Jude, MD  Assessment/Plan: Principal Problem:  *HTN (hypertension), malignant Active Problems:  DIABETES MELLITUS, NONINSULIN DEPENDENT (NIDDM)  Hypercholesteremia  GERD (gastroesophageal reflux disease)  Nausea and vomiting  UTI (urinary tract infection)  Diarrhea  Hypertensive urgency will resume Coreg clonidine, reintroduce home medications  Chest pain no pulmonary embolism cardiac enzymes negative, 2-D echo ordered, no pulmonary embolism, discontinue heparin drip  Headache/nausea/vomiting MRI of the brain ordered continue aspirin  Urinary tract infection continue Rocephin  Left upper quadrant pain no obvious pathology found on CT scan  Hypokalemia replete  Diabetes start the patient on sliding scale insulin, check hemoglobin A1c  Code Status: full Family Communication: family updated about patient's clinical progress Disposition Plan:  As above    Brief narrative: 51 year old American female with history of hypertension on multiple antihypertensives, diabetes, hyperlipidemia, GERD presented with multiple episodes of nausea and vomiting that started today around 10 AM   Consultants:  Cardiology  Pulmonary critical care  Procedures:  None  Antibiotics:  Rocephin  HPI/Subjective: Pain improved overnight   Objective: Filed Vitals:   01/19/12 0815 01/19/12 0830 01/19/12 0845 01/19/12 0900  BP: 155/66 156/69 162/68 154/75  Pulse: 126 126 126 126  Temp: 98.6 F (37 C) 98.6 F (37 C) 98.6 F (37 C) 98.6 F (37 C)  TempSrc: Core (Comment) Core (Comment) Core (Comment) Core (Comment)  Resp: 27 23 26 30   Height:      Weight:      SpO2: 97% 98% 95% 96%    Intake/Output Summary (Last 24 hours) at 01/19/12 0916 Last data filed at 01/19/12 0900  Gross per 24 hour  Intake 2913.6 ml  Output   1425 ml  Net 1488.6 ml    Exam:  HENT:  Head:  Atraumatic.  Nose: Nose normal.  Mouth/Throat: Oropharynx is clear and moist.  Eyes: Conjunctivae are normal. Pupils are equal, round, and reactive to light. No scleral icterus.  Neck: Neck supple. No tracheal deviation present.  Cardiovascular: Normal rate, regular rhythm, normal heart sounds and intact distal pulses.  Pulmonary/Chest: Effort normal and breath sounds normal. No respiratory distress.  Abdominal: Soft. Normal appearance and bowel sounds are normal. She exhibits no distension. There is no tenderness.  Musculoskeletal: She exhibits no edema and no tenderness.  Neurological: She is alert. No cranial nerve deficit.    Data Reviewed: Basic Metabolic Panel:  Lab [id number] 01/18/12 1708 01/18/12 1645  NA 135 143 138  K 3.2* 3.7 3.6  CL 98 104 99  CO2 22 -- 28  GLUCOSE 297* 225* 226*  BUN 6 4* 6  CREATININE 0.49* 0.80 0.62  CALCIUM 9.1 -- 10.2  MG -- -- --  PHOS -- -- --    Liver Function Tests:  Lab 01/18/12 1645  AST 14  ALT 16  ALKPHOS 137*  BILITOT 0.4  PROT 8.7*  ALBUMIN 3.8    Lab 01/18/12 1943 01/18/12 1645  LIPASE 26 26  AMYLASE 124* --   No results found for this basename: AMMONIA:5 in the last 168 hours  CBC:  Lab 01/19/12 0339 01/18/12 1708 01/18/12 1645  WBC 17.1* -- 10.5  NEUTROABS -- -- 8.2*  HGB 14.8 16.7* 13.7  HCT 43.1 49.0* 43.2  MCV 79.2 -- 79.4  PLT 419* -- 459*    Cardiac Enzymes:  Lab 01/19/12 0805 01/19/12 0129 01/18/12 1943  CKTOTAL 70 87 85  CKMB 3.7 2.9 2.3  CKMBINDEX -- -- --  TROPONINI <0.30 <0.30 <0.30   BNP (last 3 results) No results found for this basename: PROBNP:3 in the last 8760 hours   CBG:  Lab 01/19/12 0426 01/18/12 2325 01/18/12 1831  GLUCAP 279* 238* 221*    Recent Results (from the past 240 hour(s))  MRSA PCR SCREENING     Status: Normal   Collection Time   01/18/12  8:05 PM      Component Value Range Status Comment   MRSA by PCR NEGATIVE  NEGATIVE Final      Studies: Dg Abd 1  View  01/18/2012  *RADIOLOGY REPORT*  Clinical Data: Abdominal pelvic pain with nausea/vomiting.  ABDOMEN - 1 VIEW  Comparison: 01/22/2006 radiographs  Findings: The bowel gas pattern is unremarkable. There is no evidence of bowel obstruction. No suspicious calcifications are identified. Cholecystectomy clips are present. No acute bony abnormalities are identified.  IMPRESSION: No acute or significant abnormalities identified.   Original Report Authenticated By: Lura Em, M.D.    Ct Head Wo Contrast  01/18/2012  *RADIOLOGY REPORT*  Clinical Data: Vomiting  CT HEAD WITHOUT CONTRAST  Technique:  Contiguous axial images were obtained from the base of the skull through the vertex without contrast.  Comparison: 01/23/2005.  Findings:  The brainstem and cerebellum appear unremarkable.  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  Exact age is indeterminate . No evidence of mass lesion, hemorrhage, hydrocephalus or extra-axial collection.  The calvarium is unremarkable.  Sinuses, middle ears and mastoids are clear.  IMPRESSION: 1 cm low density in the right frontal white matter likely to represent an old infarction.  The exact age is indeterminate.  The remainder the study is normal.   Original Report Authenticated By: Jules Schick, M.D.    Ct Angio Chest Pe W/cm &/or Wo Cm  01/19/2012  *RADIOLOGY REPORT*  Clinical Data: Questionable pulmonary embolism on abdominal CT.  CT ANGIOGRAPHY CHEST  Technique:  Multidetector CT imaging of the chest using the standard protocol during bolus administration of intravenous contrast. Multiplanar reconstructed images including MIPs were obtained and reviewed to evaluate the vascular anatomy.  Contrast: 155mL OMNIPAQUE IOHEXOL 350 MG/ML SOLN  Comparison: 01/19/2012  Findings: Suboptimal contrast bolus timing.  Allowing for this, no pulmonary arterial branch filling defect is  identified.  Heart size upper normal to mildly enlarged.  No pleural or pericardial effusion.  No intrathoracic lymphadenopathy.  Scattered atherosclerosis of the aorta.  No aneurysmal dilatation.  Central airways are patent.  Mild dependent opacities bilaterally, likely scarring or atelectasis.  No pneumothorax.  Limited images through the upper abdomen show no acute finding.  No acute osseous finding.  IMPRESSION: No pulmonary arterial filling defect identified. The vessel questioned on abdominal CT may have corresponded to mixing artifact as I do not see a filling defect here on the current examination.  No acute intrathoracic process.   Original Report Authenticated By: Suanne Marker, M.D.    Ct Abdomen Pelvis W Contrast  01/19/2012  *RADIOLOGY REPORT*  Clinical Data: Nausea, vomiting.  CT ABDOMEN AND PELVIS WITH CONTRAST  Technique:  Multidetector CT imaging of the abdomen and pelvis was performed following the standard protocol during bolus administration of intravenous contrast.  Contrast:  100 ml Omnipaque-300  Comparison: 01/18/2012 radiograph, 07/13/2007 CT  Findings: Heterogeneous attenuation of a right lower lobe pulmonary vessel.  I cannot confirm on this study, however, am  concerned this may represent a pulmonary embolism.  Mild bibasilar opacities, likely scarring or atelectasis.  Heart size upper normal to mildly enlarged.  Low attenuation of the liver suggests fatty infiltration.  Absent gallbladder.  Unremarkable spleen, pancreas, adrenal glands.  No biliary ductal dilatation.  Bilateral renal hypodensities are incompletely characterized.  No hydronephrosis or hydroureter.  Urinary tract calculi visualized.  No bowel obstruction.  No CT evidence for colitis.  Normal appendix.  No free intraperitoneal air or fluid.  Normal caliber vasculature with scattered atherosclerosis of the aorta and branch vessels.  Anterior abdominal wall fat containing periumbilical hernia.  No CT evidence for  incarceration.  Air non dependently within the bladder.  A Foley catheter is in place.  The uterus is lobular in contour.  No adnexal mass visualized.  Multilevel degenerative change.  No acute osseous finding.  IMPRESSION: No acute intra-abdominal process.  Questionable filling defect within a right lower lobe pulmonary branch.  Recommend PE protocol chest CT. Discussed via telephone with Dr. Greer Pickerel at 02:58 on 01/19/2012.   Original Report Authenticated By: Suanne Marker, M.D.     Scheduled Meds:   . aspirin  325 mg Oral Once  . carvedilol  25 mg Oral BID WC  . cefTRIAXone (ROCEPHIN)  IV  1 g Intravenous Once  . cefTRIAXone (ROCEPHIN)  IV  1 g Intravenous Q24H  . cloNIDine  0.2 mg Transdermal Weekly  . diltiazem  10 mg Intravenous Once  . heparin  4,000 Units Intravenous Once  . insulin aspart  0-9 Units Subcutaneous Q4H  . labetalol  20 mg Intravenous Once  . metoprolol  5 mg Intravenous Once  .  morphine injection  2 mg Intravenous Once  . niCARDipine  5 mg/hr Intravenous Once  . ondansetron      . ondansetron (ZOFRAN) IV  4 mg Intravenous Once  . pantoprazole (PROTONIX) IV  40 mg Intravenous Q12H  . promethazine  12.5 mg Intravenous Once  . sodium chloride  1,000 mL Intravenous Once  . DISCONTD: aspirin  300 mg Rectal Once  . DISCONTD: enoxaparin (LOVENOX) injection  65 mg Subcutaneous Q24H  . DISCONTD: metoprolol tartrate  25 mg Oral BID   Continuous Infusions:   . sodium chloride 100 mL/hr at 01/18/12 2000  . heparin 1,250 Units/hr (01/19/12 0300)  . niCARDipine 5 mg/hr (01/19/12 0831)  . nitroGLYCERIN 60 mcg/min (01/19/12 0831)  . DISCONTD: niCARDipine Stopped (01/18/12 2145)    Principal Problem:  *HTN (hypertension), malignant Active Problems:  DIABETES MELLITUS, NONINSULIN DEPENDENT (NIDDM)  Hypercholesteremia  GERD (gastroesophageal reflux disease)  Nausea and vomiting  UTI (urinary tract infection)  Diarrhea    Time spent: 40  minutes   [facility] Pager [phone]. If 8PM-8AM, please contact night-coverage at [email or web address], password [facility] 01/19/2012, 9:16 AM  LOS: 1 day

## 2012-01-19 NOTE — Progress Notes (Signed)
Subjective:  Feeling a little better. No CP currently. Resting. Now tachycardic. No PE.   Objective:  Vital Signs in the last 24 hours: Temp:  [98.4 F (36.9 C)-99.3 F (37.4 C)] 98.6 F (37 C) (09/15 0900) Pulse Rate:  [75-131] 126  (09/15 0900) Resp:  [12-37] 30  (09/15 0900) BP: (140-221)/(54-115) 154/75 mmHg (09/15 0900) SpO2:  [94 %-100 %] 96 % (09/15 0900) Weight:  [130.8 kg (288 lb 5.8 oz)-131.997 kg (291 lb)] 130.8 kg (288 lb 5.8 oz) (09/14 2000)  Intake/Output from previous day: 09/14 0701 - 09/15 0700 In: 2618.6 [I.V.:2598.6; IV Piggyback:20] Out: 1200 [Urine:1200]   Physical Exam: General: Well developed, well nourished, in no acute distress. Appears tired.  Head:  Normocephalic and atraumatic. Lungs: Clear to auscultation and percussion. Heart: Tachy RR.  No murmur, rubs or gallops.  Abdomen: soft, non-tender, positive bowel sounds.Obese Extremities: No clubbing or cyanosis. No edema. Neurologic: Alert and oriented x 3.    Lab Results:  Basename 01/19/12 0339 01/18/12 1708 01/18/12 1645  WBC 17.1* -- 10.5  HGB 14.8 16.7* --  PLT 419* -- 459*    Basename 01/19/12 0339 01/18/12 1708 01/18/12 1645  NA 135 143 --  K 3.2* 3.7 --  CL 98 104 --  CO2 22 -- 28  GLUCOSE 297* 225* --  BUN 6 4* --  CREATININE 0.49* 0.80 --    Basename 01/19/12 0805 01/19/12 0129  TROPONINI <0.30 <0.30   Hepatic Function Panel  Basename 01/18/12 1645  PROT 8.7*  ALBUMIN 3.8  AST 14  ALT 16  ALKPHOS 137*  BILITOT 0.4  BILIDIR --  IBILI --   Imaging: Ct Head Wo Contrast  IMPRESSION: 1 cm low density in the right frontal white matter likely to represent an old infarction.  The exact age is indeterminate.  The remainder the study is normal.   Original Report Authenticated By: Jules Schick, M.D.    Ct Angio Chest Pe W/cm &/or Wo Cm  Heart size upper normal to mildly enlarged.  No pleural or pericardial effusion.   IMPRESSION: No pulmonary arterial filling defect  identified.   Ct Abdomen Pelvis W Contrast   IMPRESSION: No acute intra-abdominal process.      Telemetry: Sinus Tachy 123 current Personally viewed.   EKG:  Sinus tachy, ST depression lat leads.   Cardiac Studies:  ECHO pending  Assessment/Plan:   51 year old with morbid obesity admitted with persistent nausea and vomiting as well as LUQ abd pain, hypertensive urgency, abnormal ECG.   1. Chest pain - likely from GI source/ emesis. Troponin negative and reassuring. ECG abnormality with ST depression may be tachy induced repolarization change or perhaps exacerbated by severe HTN. Ischemia can not be excluded as contributing to ECG change. No objective evidence of ACS. ECHO pending.   2. HTN urgency/ accelerated HTN - nicardipine gtt. NTG gtt.  Clonidine, Coreg 25 BID. Consider wean off drip and add amlodipine 10mg  when able. Also consider spironolactone as well.   3. Leukocytosis - WBC now 17, PLTS increased as well 459. Acute phase reactant. ? Infectious source causing abd pain. CT's unremarkable.   4. Morbid obesity - encourage weight loss.   Will follow.      SKAINS, [place] 01/19/2012, 9:29 AM

## 2012-01-19 NOTE — Progress Notes (Signed)
[facility] Progress Note Patient Name: ABIHAIL COALE DOB: 12-Jan-1961 MRN: [id number]  Date of Service  01/19/2012   HPI/Events of Note  Radiology called regarding RLL pulmonary embolism.  Pt. Already on anticoagulation with heparin.   eICU Interventions  Will order CTA Will cont. Heparin. Will order echo to look for Rt heart strain.      Emelda Brothers 01/19/2012, 3:04 AM

## 2012-01-20 LAB — COMPREHENSIVE METABOLIC PANEL
ALT: 10 U/L (ref 0–35)
AST: 11 U/L (ref 0–37)
Albumin: 3.2 g/dL — ABNORMAL LOW (ref 3.5–5.2)
Alkaline Phosphatase: 111 U/L (ref 39–117)
CO2: 26 mEq/L (ref 19–32)
Chloride: 103 mEq/L (ref 96–112)
Creatinine, Ser: 0.82 mg/dL (ref 0.50–1.10)
GFR calc non Af Amer: 81 mL/min — ABNORMAL LOW (ref >90)
Potassium: 3.9 mEq/L (ref 3.5–5.1)
Total Bilirubin: 0.4 mg/dL (ref 0.3–1.2)

## 2012-01-20 LAB — CBC
MCH: 25.3 pg — ABNORMAL LOW (ref 26.0–34.0)
MCHC: 31.4 g/dL (ref 30.0–36.0)
MCV: 80.4 fL (ref 78.0–100.0)
Platelets: 374 10*3/uL (ref 150–400)
RBC: 5.06 MIL/uL (ref 3.87–5.11)
RDW: 15.4 % (ref 11.5–15.5)

## 2012-01-20 LAB — GLUCOSE, CAPILLARY
Glucose-Capillary: 157 mg/dL — ABNORMAL HIGH (ref 70–99)
Glucose-Capillary: 180 mg/dL — ABNORMAL HIGH (ref 70–99)

## 2012-01-20 MED ORDER — AMLODIPINE BESYLATE 2.5 MG PO TABS
2.5000 mg | ORAL_TABLET | Freq: Every day | ORAL | Status: DC
  Administered 2012-01-20 – 2012-01-21 (×2): 2.5 mg via ORAL
  Filled 2012-01-20 (×3): qty 1

## 2012-01-20 NOTE — Progress Notes (Signed)
Subjective:  She feels much better, no shortness of breath, heart rate is much decreased. Still with some mild left upper quadrant discomfort.  Objective:  Vital Signs in the last 24 hours: Temp:  [98.2 F (36.8 C)-99 F (37.2 C)] 98.2 F (36.8 C) (09/16 0600) Pulse Rate:  [68-126] 68  (09/16 0600) Resp:  [14-30] 18  (09/16 0600) BP: (94-172)/(43-87) 101/59 mmHg (09/16 0600) SpO2:  [95 %-100 %] 99 % (09/16 0600) Weight:  [131.6 kg (290 lb 2 oz)] 131.6 kg (290 lb 2 oz) (09/16 0300)  Intake/Output from previous day: 09/15 0701 - 09/16 0700 In: 525 [P.O.:120; I.V.:353; IV Piggyback:52] Out: 2075 [Urine:2075]   Physical Exam: General: Well developed, well nourished, in no acute distress. Head:  Normocephalic and atraumatic. Lungs: Clear to auscultation and percussion. Heart: Normal S1 and S2.  No murmur, rubs or gallops.  Abdomen: soft, slightly tender left upper quadrant, positive bowel sounds. Obese Extremities: No clubbing or cyanosis. No edema. Neurologic: Alert and oriented x 3.    Lab Results:  Basename 01/19/12 0339 01/18/12 1708 01/18/12 1645  WBC 17.1* -- 10.5  HGB 14.8 16.7* --  PLT 419* -- 459*    Basename 01/19/12 0339 01/18/12 1708 01/18/12 1645  NA 135 143 --  K 3.2* 3.7 --  CL 98 104 --  CO2 22 -- 28  GLUCOSE 297* 225* --  BUN 6 4* --  CREATININE 0.49* 0.80 --    Basename 01/19/12 0805 01/19/12 0129  TROPONINI <0.30 <0.30   Hepatic Function Panel  Basename 01/18/12 1645  PROT 8.7*  ALBUMIN 3.8  AST 14  ALT 16  ALKPHOS 137*  BILITOT 0.4  BILIDIR --  IBILI --    Basename 01/19/12 1410  CHOL 217*   No results found for this basename: PROTIME in the last 72 hours  Imaging: No pulmonary embolism.  Telemetry: Sinus rhythm, no adverse arrhythmias, decreased heart rate from sinus tachycardia yesterday. Personally viewed.   EKG:  Prior EKG with ST segment changes during tachycardia.  Cardiac Studies:  Echocardiogram  pending  Assessment/Plan:   51 year old female with left upper quadrant abdominal pain, previous tachycardia with EKG changes.   1. Abnormal EKG/tachycardia-currently reassuring. Doing well. Much improved. Await echocardiogram. No chest discomfort. Prior chest discomfort likely from GI source/emesis. Normal troponin.  2. Accelerated hypertension-much improved, she is off of nitroglycerin IV, nicardipine IV. Currently on amlodipine 10 mg Clonidine 0.3 mg twice a day, carvedilol 25 mg twice a day, losartan 25 mg a day. Blood pressure much better control. Actually, may need to decrease amlodipine dose to 5 mg daily. Continue to monitor.  3. Morbid obesity-encourage weight loss  I will review echocardiogram. At this point, I do not believe that further cardiac testing is warranted. If she begins to have exertional chest discomfort please let me know. I will go ahead and sign off.   SKAINS, [place] 01/20/2012, 8:27 AM

## 2012-01-20 NOTE — Progress Notes (Signed)
  Echocardiogram 2D Echocardiogram has been performed.  Basilia Jumbo 01/20/2012, 10:38 AM

## 2012-01-20 NOTE — Progress Notes (Signed)
[facility] PROGRESS NOTE  [name] [id number] DOB: May 23, 1960 DOA: 01/18/2012 PCP: Donnamae Jude, MD  Assessment/Plan: Principal Problem:  *HTN (hypertension), malignant Active Problems:  DIABETES MELLITUS, NONINSULIN DEPENDENT (NIDDM)  Hypercholesteremia  GERD (gastroesophageal reflux disease)  Nausea and vomiting  UTI (urinary tract infection)  Diarrhea    Hypertensive urgency she is off of nitroglycerin IV, nicardipine IV. Currently on amlodipine 10 mg  Clonidine 0.3 mg twice a day, carvedilol 25 mg twice a day, losartan 25 mg a day. Blood pressure much better control. Actually, may need to decrease amlodipine dose to 5 mg daily. Continue to monitor.  Chest pain no pulmonary embolism cardiac enzymes negative, Doing well. Much improved. Await echocardiogram. No chest discomfort. Prior chest discomfort likely from GI source/emesis. Normal troponin.  discontinue heparin drip  Stress test prior to discharge?  Headache/nausea/vomiting MRI of the brain ordered continue aspirin   Urinary tract infection continue Rocephin   Left upper quadrant pain no obvious pathology found on CT scan   Hypokalemia replete   Diabetes start the patient on sliding scale insulin, check hemoglobin A1c    Code Status: full  Family Communication: family updated about patient's clinical progress  Disposition Plan: As above    Brief narrative:  51 year old American female with history of hypertension on multiple antihypertensives, diabetes, hyperlipidemia, GERD presented with multiple episodes of nausea and vomiting that started today around 10 AM  Consultants:  Cardiology  Pulmonary critical care Procedures:  None Antibiotics:  Rocephin HPI/Subjective:  Pain improved overnight   Telemetry: Sinus rhythm, no adverse arrhythmias, decreased heart rate from sinus tachycardia yesterday.   Objective: Filed Vitals:   01/20/12 0400 01/20/12 0600 01/20/12 0800 01/20/12 1043  BP:  98/50 101/59 112/63 131/66  Pulse: 78 68 80   Temp: 98.8 F (37.1 C) 98.2 F (36.8 C) 98.1 F (36.7 C)   TempSrc: Core (Comment) Core (Comment)    Resp: 15 18 24    Height:      Weight:      SpO2: 98% 99% 100%     Intake/Output Summary (Last 24 hours) at 01/20/12 1125 Last data filed at 01/20/12 1040  Gross per 24 hour  Intake    675 ml  Output   2025 ml  Net  -1350 ml    Exam:  HENT:  Head: Atraumatic.  Nose: Nose normal.  Mouth/Throat: Oropharynx is clear and moist.  Eyes: Conjunctivae are normal. Pupils are equal, round, and reactive to light. No scleral icterus.  Neck: Neck supple. No tracheal deviation present.  Cardiovascular: Normal rate, regular rhythm, normal heart sounds and intact distal pulses.  Pulmonary/Chest: Effort normal and breath sounds normal. No respiratory distress.  Abdominal: Soft. Normal appearance and bowel sounds are normal. She exhibits no distension. There is no tenderness.  Musculoskeletal: She exhibits no edema and no tenderness.  Neurological: She is alert. No cranial nerve deficit.    Data Reviewed: Basic Metabolic Panel:  Lab [id number] 01/19/12 0339 01/18/12 1708 01/18/12 1645  NA 139 135 143 138  K 3.9 3.2* 3.7 3.6  CL 103 98 104 99  CO2 26 22 -- 28  GLUCOSE 203* 297* 225* 226*  BUN 11 6 4* 6  CREATININE 0.82 0.49* 0.80 0.62  CALCIUM 9.2 9.1 -- 10.2  MG -- -- -- --  PHOS -- -- -- --    Liver Function Tests:  Lab 01/20/12 0950 01/18/12 1645  AST 11 14  ALT 10 16  ALKPHOS 111 137*  BILITOT 0.4  0.4  PROT 7.3 8.7*  ALBUMIN 3.2* 3.8    Lab 01/18/12 1943 01/18/12 1645  LIPASE 26 26  AMYLASE 124* --   No results found for this basename: AMMONIA:5 in the last 168 hours  CBC:  Lab 01/20/12 0950 01/19/12 0339 01/18/12 1708 01/18/12 1645  WBC 10.0 17.1* -- 10.5  NEUTROABS -- -- -- 8.2*  HGB 12.8 14.8 16.7* 13.7  HCT 40.7 43.1 49.0* 43.2  MCV 80.4 79.2 -- 79.4  PLT 374 419* -- 459*    Cardiac Enzymes:  Lab  01/19/12 1410 01/19/12 0805 01/19/12 0129 01/18/12 1943  CKTOTAL 56 70 87 85  CKMB 3.5 3.7 2.9 2.3  CKMBINDEX -- -- -- --  TROPONINI -- <0.30 <0.30 <0.30   BNP (last 3 results) No results found for this basename: PROBNP:3 in the last 8760 hours   CBG:  Lab 01/20/12 0751 01/20/12 0421 01/20/12 0012 01/19/12 2013 01/19/12 1619  GLUCAP 145* 137* 157* 226* 236*    Recent Results (from the past 240 hour(s))  MRSA PCR SCREENING     Status: Normal   Collection Time   01/18/12  8:05 PM      Component Value Range Status Comment   MRSA by PCR NEGATIVE  NEGATIVE Final      Studies: Dg Abd 1 View  01/18/2012  *RADIOLOGY REPORT*  Clinical Data: Abdominal pelvic pain with nausea/vomiting.  ABDOMEN - 1 VIEW  Comparison: 01/22/2006 radiographs  Findings: The bowel gas pattern is unremarkable. There is no evidence of bowel obstruction. No suspicious calcifications are identified. Cholecystectomy clips are present. No acute bony abnormalities are identified.  IMPRESSION: No acute or significant abnormalities identified.   Original Report Authenticated By: Lura Em, M.D.    Ct Head Wo Contrast  01/18/2012  *RADIOLOGY REPORT*  Clinical Data: Vomiting  CT HEAD WITHOUT CONTRAST  Technique:  Contiguous axial images were obtained from the base of the skull through the vertex without contrast.  Comparison: 01/23/2005.  Findings:  The brainstem and cerebellum appear unremarkable.  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  Exact age is indeterminate . No evidence of mass lesion, hemorrhage, hydrocephalus or extra-axial collection.  The calvarium is unremarkable.  Sinuses, middle ears and mastoids are clear.  IMPRESSION: 1 cm low density in the right frontal white matter likely to represent an old infarction.  The exact age is indeterminate.  The remainder the study is normal.   Original Report Authenticated  By: Jules Schick, M.D.    Ct Angio Chest Pe W/cm &/or Wo Cm  01/19/2012  *RADIOLOGY REPORT*  Clinical Data: Questionable pulmonary embolism on abdominal CT.  CT ANGIOGRAPHY CHEST  Technique:  Multidetector CT imaging of the chest using the standard protocol during bolus administration of intravenous contrast. Multiplanar reconstructed images including MIPs were obtained and reviewed to evaluate the vascular anatomy.  Contrast: 165mL OMNIPAQUE IOHEXOL 350 MG/ML SOLN  Comparison: 01/19/2012  Findings: Suboptimal contrast bolus timing.  Allowing for this, no pulmonary arterial branch filling defect is identified.  Heart size upper normal to mildly enlarged.  No pleural or pericardial effusion.  No intrathoracic lymphadenopathy.  Scattered atherosclerosis of the aorta.  No aneurysmal dilatation.  Central airways are patent.  Mild dependent opacities bilaterally, likely scarring or atelectasis.  No pneumothorax.  Limited images through the upper abdomen show no acute finding.  No acute osseous finding.  IMPRESSION: No pulmonary arterial  filling defect identified. The vessel questioned on abdominal CT may have corresponded to mixing artifact as I do not see a filling defect here on the current examination.  No acute intrathoracic process.   Original Report Authenticated By: Suanne Marker, M.D.    Ct Abdomen Pelvis W Contrast  01/19/2012  *RADIOLOGY REPORT*  Clinical Data: Nausea, vomiting.  CT ABDOMEN AND PELVIS WITH CONTRAST  Technique:  Multidetector CT imaging of the abdomen and pelvis was performed following the standard protocol during bolus administration of intravenous contrast.  Contrast:  100 ml Omnipaque-300  Comparison: 01/18/2012 radiograph, 07/13/2007 CT  Findings: Heterogeneous attenuation of a right lower lobe pulmonary vessel.  I cannot confirm on this study, however, am concerned this may represent a pulmonary embolism.  Mild bibasilar opacities, likely scarring or atelectasis.  Heart size  upper normal to mildly enlarged.  Low attenuation of the liver suggests fatty infiltration.  Absent gallbladder.  Unremarkable spleen, pancreas, adrenal glands.  No biliary ductal dilatation.  Bilateral renal hypodensities are incompletely characterized.  No hydronephrosis or hydroureter.  Urinary tract calculi visualized.  No bowel obstruction.  No CT evidence for colitis.  Normal appendix.  No free intraperitoneal air or fluid.  Normal caliber vasculature with scattered atherosclerosis of the aorta and branch vessels.  Anterior abdominal wall fat containing periumbilical hernia.  No CT evidence for incarceration.  Air non dependently within the bladder.  A Foley catheter is in place.  The uterus is lobular in contour.  No adnexal mass visualized.  Multilevel degenerative change.  No acute osseous finding.  IMPRESSION: No acute intra-abdominal process.  Questionable filling defect within a right lower lobe pulmonary branch.  Recommend PE protocol chest CT. Discussed via telephone with Dr. Greer Pickerel at 02:58 on 01/19/2012.   Original Report Authenticated By: Suanne Marker, M.D.     Scheduled Meds:   . amLODipine  2.5 mg Oral Daily  . carvedilol  25 mg Oral BID WC  . cefTRIAXone (ROCEPHIN)  IV  1 g Intravenous Q24H  . cloNIDine  0.3 mg Oral BID  . enoxaparin (LOVENOX) injection  40 mg Subcutaneous Q24H  . insulin aspart  0-9 Units Subcutaneous Q4H  . insulin glargine  10 Units Subcutaneous Daily  . losartan  25 mg Oral Daily  . pantoprazole (PROTONIX) IV  40 mg Intravenous Q12H  . potassium chloride  20 mEq Oral BID  . DISCONTD: amLODipine  10 mg Oral Daily  . DISCONTD: potassium chloride  20 mEq Oral BID   Continuous Infusions:   . DISCONTD: nitroGLYCERIN Stopped (01/19/12 1345)    Principal Problem:  *HTN (hypertension), malignant Active Problems:  DIABETES MELLITUS, NONINSULIN DEPENDENT (NIDDM)  Hypercholesteremia  GERD (gastroesophageal reflux disease)  Nausea and vomiting  UTI  (urinary tract infection)  Diarrhea    Time spent: 40 minutes   [facility] Pager [phone]. If 8PM-8AM, please contact night-coverage at [email or web address], password [facility] 01/20/2012, 11:25 AM  LOS: 2 days

## 2012-01-21 LAB — GLUCOSE, CAPILLARY: Glucose-Capillary: 152 mg/dL — ABNORMAL HIGH (ref 70–99)

## 2012-01-21 MED ORDER — INSULIN ASPART 100 UNIT/ML ~~LOC~~ SOLN
0.0000 [IU] | Freq: Three times a day (TID) | SUBCUTANEOUS | Status: DC
  Administered 2012-01-21: 11 [IU] via SUBCUTANEOUS

## 2012-01-21 MED ORDER — INSULIN GLARGINE 100 UNIT/ML ~~LOC~~ SOLN: 10.0000 [IU] | Freq: Every day | SUBCUTANEOUS | Status: DC

## 2012-01-21 MED ORDER — CIPROFLOXACIN HCL 500 MG PO TABS: 500.0000 mg | ORAL_TABLET | Freq: Two times a day (BID) | ORAL | Status: AC

## 2012-01-21 MED ORDER — INSULIN GLARGINE 100 UNIT/ML ~~LOC~~ SOLN: 15.0000 [IU] | Freq: Every day | SUBCUTANEOUS | Status: DC

## 2012-01-21 MED ORDER — PANTOPRAZOLE SODIUM 40 MG PO TBEC: 40.0000 mg | DELAYED_RELEASE_TABLET | Freq: Two times a day (BID) | ORAL | Status: DC

## 2012-01-21 MED ORDER — "INSULIN SYRINGE 29G X 1/2"" 0.3 ML MISC": Status: DC

## 2012-01-21 MED ORDER — PANTOPRAZOLE SODIUM 40 MG PO TBEC
40.0000 mg | DELAYED_RELEASE_TABLET | Freq: Two times a day (BID) | ORAL | Status: DC
  Administered 2012-01-21: 40 mg via ORAL
  Filled 2012-01-21: qty 1

## 2012-01-21 MED ORDER — ACCU-CHEK SOFT TOUCH LANCETS MISC: Status: DC

## 2012-01-21 MED ORDER — INSULIN GLARGINE 100 UNIT/ML ~~LOC~~ SOLN
15.0000 [IU] | Freq: Every day | SUBCUTANEOUS | Status: DC
  Administered 2012-01-21: 15 [IU] via SUBCUTANEOUS

## 2012-01-21 NOTE — Progress Notes (Signed)
Patient discharged to home.  RN taught patient how to draw up and inject herself with insulin.  Patient was able to draw up her own dose of lantus insulin.  Patient stated she will be better when she uses her glasses which were at home.  RN printed up patient education materials of lanuts insulin, hypoglycemia, hyperglycemia and diabetes and exercise.  IV removed from right and left antecube.  Reviewed discharge instructions and prescriptions with patient.  Patient without questions at this time.  Patient escorted to lobby via wheelchair.  Patient discharged.

## 2012-01-21 NOTE — Progress Notes (Signed)
PHARMACY BRIEF NOTE:  PROTONIX IV TO PO CONVERSION  The patient is receiving Protonix by the intravenous route.  Based on criteria approved by the Pharmacy and [facility], the medication is being converted to the equivalent oral dose form.  These criteria include: -No Active GI bleeding -Able to tolerate diet of full liquids (or better) or tube feeding OR able to tolerate other medications by the oral or enteral route  If you have any questions about this conversion, please contact the Pharmacy Department (ext [phone]).  Thank you.  Joycelyn Rua, PHARMD Pager: [phone] 01/21/2012 8:54 AM

## 2012-01-21 NOTE — Progress Notes (Signed)
Pulse ox pre ambulation on room air 97%, HR 67. Pulse ox during ambulation on room air 98% HR 84. Post ambulation on room air 97% HR 79.  Patient tolerated ambulation well.

## 2012-01-21 NOTE — Progress Notes (Signed)
PT Cancellation Note  ___Treatment cancelled today due to medical issues with patient which prohibited   therapy  ___ Treatment cancelled today due to patient receiving procedure or test   ___ Treatment cancelled today due to patient's refusal to participate   __x_ Evaluationcancelled today due to pt. Has ambulated in hall w/ RN. Check back 9/18 [place] [facility] [phone]

## 2012-01-21 NOTE — Discharge Summary (Signed)
Physician Discharge Summary  [name] MRN: [id number] DOB/AGE: 1960-06-24 51 y.o.  PCP: Donnamae Jude, MD   Admit date: 01/18/2012 Discharge date: 01/21/2012  Discharge Diagnoses:  Morbid obesity   *HTN (hypertension), malignant Active Problems:  DIABETES MELLITUS, NONINSULIN DEPENDENT (NIDDM)  Hypercholesteremia  GERD (gastroesophageal reflux disease)  Nausea and vomiting  UTI (urinary tract infection)  Diarrhea     Medication List     As of 01/21/2012 11:26 AM    TAKE these medications         amLODipine 10 MG tablet   Commonly known as: NORVASC   Take 10 mg by mouth daily.      aspirin 325 MG tablet   Take 325 mg by mouth daily.      carvedilol 25 MG tablet   Commonly known as: COREG   Take 25 mg by mouth 2 (two) times daily with a meal.      ciprofloxacin 500 MG tablet   Commonly known as: CIPRO   Take 1 tablet (500 mg total) by mouth 2 (two) times daily.      cloNIDine 0.3 MG tablet   Commonly known as: CATAPRES   Take 0.3 mg by mouth 2 (two) times daily.      doxepin 10 MG capsule   Commonly known as: SINEQUAN   Take 10 mg by mouth at bedtime.      glucose blood test strip   1 each. Use as instructed      glucose monitoring kit monitoring kit   1 each by Does not apply route as needed.      hydrochlorothiazide 25 MG tablet   Commonly known as: HYDRODIURIL   Take 25 mg by mouth daily.      insulin glargine 100 UNIT/ML injection   Commonly known as: LANTUS   Inject 10 Units into the skin daily.      losartan 25 MG tablet   Commonly known as: COZAAR   Take 25 mg by mouth daily.      metFORMIN 500 MG tablet   Commonly known as: GLUCOPHAGE   Take 500 mg by mouth 2 (two) times daily with a meal.      omeprazole 20 MG capsule   Commonly known as: PRILOSEC   Take 1 capsule (20 mg total) by mouth 2 (two) times daily.      pantoprazole 40 MG tablet   Commonly known as: PROTONIX   Take 1 tablet (40 mg total) by mouth 2 (two) times  daily.      potassium chloride 10 MEQ tablet   Commonly known as: K-DUR   Take 20 mEq by mouth 2 (two) times daily.      potassium chloride SA 20 MEQ tablet   Commonly known as: K-DUR,KLOR-CON   Take 20 mEq by mouth 2 (two) times daily.      simvastatin 10 MG tablet   Commonly known as: ZOCOR   Take 10 mg by mouth at bedtime.        Discharge Condition: Stable   Disposition:    Consults:  #1 cardiology   #2 critical care   Significant Diagnostic Studies: Dg Abd 1 View  01/18/2012  *RADIOLOGY REPORT*  Clinical Data: Abdominal pelvic pain with nausea/vomiting.  ABDOMEN - 1 VIEW  Comparison: 01/22/2006 radiographs  Findings: The bowel gas pattern is unremarkable. There is no evidence of bowel obstruction. No suspicious calcifications are identified. Cholecystectomy clips are present. No acute bony abnormalities are identified.  IMPRESSION:  No acute or significant abnormalities identified.   Original Report Authenticated By: Lura Em, M.D.    Ct Head Wo Contrast  01/18/2012  *RADIOLOGY REPORT*  Clinical Data: Vomiting  CT HEAD WITHOUT CONTRAST  Technique:  Contiguous axial images were obtained from the base of the skull through the vertex without contrast.  Comparison: 01/23/2005.  Findings:  The brainstem and cerebellum appear unremarkable.  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  Exact age is indeterminate . No evidence of mass lesion, hemorrhage, hydrocephalus or extra-axial collection.  The calvarium is unremarkable.  Sinuses, middle ears and mastoids are clear.  IMPRESSION: 1 cm low density in the right frontal white matter likely to represent an old infarction.  The exact age is indeterminate.  The remainder the study is normal.   Original Report Authenticated By: Jules Schick, M.D.    Ct Angio Chest Pe W/cm &/or Wo Cm  01/19/2012  *RADIOLOGY REPORT*  Clinical Data:  Questionable pulmonary embolism on abdominal CT.  CT ANGIOGRAPHY CHEST  Technique:  Multidetector CT imaging of the chest using the standard protocol during bolus administration of intravenous contrast. Multiplanar reconstructed images including MIPs were obtained and reviewed to evaluate the vascular anatomy.  Contrast: 148mL OMNIPAQUE IOHEXOL 350 MG/ML SOLN  Comparison: 01/19/2012  Findings: Suboptimal contrast bolus timing.  Allowing for this, no pulmonary arterial branch filling defect is identified.  Heart size upper normal to mildly enlarged.  No pleural or pericardial effusion.  No intrathoracic lymphadenopathy.  Scattered atherosclerosis of the aorta.  No aneurysmal dilatation.  Central airways are patent.  Mild dependent opacities bilaterally, likely scarring or atelectasis.  No pneumothorax.  Limited images through the upper abdomen show no acute finding.  No acute osseous finding.  IMPRESSION: No pulmonary arterial filling defect identified. The vessel questioned on abdominal CT may have corresponded to mixing artifact as I do not see a filling defect here on the current examination.  No acute intrathoracic process.   Original Report Authenticated By: Suanne Marker, M.D.    Ct Abdomen Pelvis W Contrast  01/19/2012  *RADIOLOGY REPORT*  Clinical Data: Nausea, vomiting.  CT ABDOMEN AND PELVIS WITH CONTRAST  Technique:  Multidetector CT imaging of the abdomen and pelvis was performed following the standard protocol during bolus administration of intravenous contrast.  Contrast:  100 ml Omnipaque-300  Comparison: 01/18/2012 radiograph, 07/13/2007 CT  Findings: Heterogeneous attenuation of a right lower lobe pulmonary vessel.  I cannot confirm on this study, however, am concerned this may represent a pulmonary embolism.  Mild bibasilar opacities, likely scarring or atelectasis.  Heart size upper normal to mildly enlarged.  Low attenuation of the liver suggests fatty infiltration.  Absent gallbladder.   Unremarkable spleen, pancreas, adrenal glands.  No biliary ductal dilatation.  Bilateral renal hypodensities are incompletely characterized.  No hydronephrosis or hydroureter.  Urinary tract calculi visualized.  No bowel obstruction.  No CT evidence for colitis.  Normal appendix.  No free intraperitoneal air or fluid.  Normal caliber vasculature with scattered atherosclerosis of the aorta and branch vessels.  Anterior abdominal wall fat containing periumbilical hernia.  No CT evidence for incarceration.  Air non dependently within the bladder.  A Foley catheter is in place.  The uterus is lobular in contour.  No adnexal mass visualized.  Multilevel degenerative change.  No acute osseous finding.  IMPRESSION: No acute intra-abdominal process.  Questionable filling defect within  a right lower lobe pulmonary branch.  Recommend PE protocol chest CT. Discussed via telephone with Dr. Greer Pickerel at 02:58 on 01/19/2012.   Original Report Authenticated By: Suanne Marker, M.D.      2-D echo LV EF: 55% - 60%  ------------------------------------------------------------ Indications: Pulmonary embolus 415.19.  ------------------------------------------------------------ History: Risk factors: Former tobacco use. Hypertension. Diabetes mellitus. Obese. Dyslipidemia.  ------------------------------------------------------------ Study Conclusions  - Left ventricle: The cavity size was normal. There was mild concentric hypertrophy. Systolic function was normal. The estimated ejection fraction was in the range of 55% to 60%. Cannot exclude severe hypokinesis of the basalinferior myocardium. - Atrial septum: No defect or patent foramen ovale was identified.      Microbiology: Recent Results (from the past 240 hour(s))  MRSA PCR SCREENING     Status: Normal   Collection Time   01/18/12  8:05 PM      Component Value Range Status Comment   MRSA by PCR NEGATIVE  NEGATIVE Final   CLOSTRIDIUM DIFFICILE BY  PCR     Status: Normal   Collection Time   01/20/12  4:11 PM      Component Value Range Status Comment   C difficile by pcr NEGATIVE  NEGATIVE Final      Labs: Results for orders placed during the hospital encounter of 01/18/12 (from the past 48 hour(s))  GLUCOSE, CAPILLARY     Status: Abnormal   Collection Time   01/19/12 12:17 PM      Component Value Range Comment   Glucose-Capillary 232 (*) 70 - 99 mg/dL   CK TOTAL AND CKMB     Status: Normal   Collection Time   01/19/12  2:10 PM      Component Value Range Comment   Total CK 56  7 - 177 U/L    CK, MB 3.5  0.3 - 4.0 ng/mL    Relative Index RELATIVE INDEX IS INVALID  0.0 - 2.5   LIPID PANEL     Status: Abnormal   Collection Time   01/19/12  2:10 PM      Component Value Range Comment   Cholesterol 217 (*) 0 - 200 mg/dL    Triglycerides 96  <150 mg/dL    HDL 50  >39 mg/dL    Total CHOL/HDL Ratio 4.3      VLDL 19  0 - 40 mg/dL    LDL Cholesterol 148 (*) 0 - 99 mg/dL   TSH     Status: Normal   Collection Time   01/19/12  2:13 PM      Component Value Range Comment   TSH 0.359  0.350 - 4.500 uIU/mL   T4, FREE     Status: Normal   Collection Time   01/19/12  2:13 PM      Component Value Range Comment   Free T4 1.28  0.80 - 1.80 ng/dL   HEMOGLOBIN A1C     Status: Abnormal   Collection Time   01/19/12  2:13 PM      Component Value Range Comment   Hemoglobin A1C 9.7 (*) <5.7 %    Mean Plasma Glucose 232 (*) <117 mg/dL   GLUCOSE, CAPILLARY     Status: Abnormal   Collection Time   01/19/12  4:19 PM      Component Value Range Comment   Glucose-Capillary 236 (*) 70 - 99 mg/dL    Comment 1 Documented in Chart      Comment 2 Notify RN     GLUCOSE,  CAPILLARY     Status: Abnormal   Collection Time   01/19/12  8:13 PM      Component Value Range Comment   Glucose-Capillary 226 (*) 70 - 99 mg/dL    Comment 1 Documented in Chart      Comment 2 Notify RN     GLUCOSE, CAPILLARY     Status: Abnormal   Collection Time   01/20/12 12:12 AM        Component Value Range Comment   Glucose-Capillary 157 (*) 70 - 99 mg/dL    Comment 1 Documented in Chart      Comment 2 Notify RN     GLUCOSE, CAPILLARY     Status: Abnormal   Collection Time   01/20/12  4:21 AM      Component Value Range Comment   Glucose-Capillary 137 (*) 70 - 99 mg/dL    Comment 1 Documented in Chart      Comment 2 Notify RN     GLUCOSE, CAPILLARY     Status: Abnormal   Collection Time   01/20/12  7:51 AM      Component Value Range Comment   Glucose-Capillary 145 (*) 70 - 99 mg/dL    Comment 1 Documented in Chart      Comment 2 Notify RN     COMPREHENSIVE METABOLIC PANEL     Status: Abnormal   Collection Time   01/20/12  9:50 AM      Component Value Range Comment   Sodium 139  135 - 145 mEq/L    Potassium 3.9  3.5 - 5.1 mEq/L    Chloride 103  96 - 112 mEq/L    CO2 26  19 - 32 mEq/L    Glucose, Bld 203 (*) 70 - 99 mg/dL    BUN 11  6 - 23 mg/dL    Creatinine, Ser 0.82  0.50 - 1.10 mg/dL    Calcium 9.2  8.4 - 10.5 mg/dL    Total Protein 7.3  6.0 - 8.3 g/dL    Albumin 3.2 (*) 3.5 - 5.2 g/dL    AST 11  0 - 37 U/L    ALT 10  0 - 35 U/L    Alkaline Phosphatase 111  39 - 117 U/L    Total Bilirubin 0.4  0.3 - 1.2 mg/dL    GFR calc non Af Amer 81 (*) >90 mL/min    GFR calc Af Amer >90  >90 mL/min   CBC     Status: Abnormal   Collection Time   01/20/12  9:50 AM      Component Value Range Comment   WBC 10.0  4.0 - 10.5 K/uL    RBC 5.06  3.87 - 5.11 MIL/uL    Hemoglobin 12.8  12.0 - 15.0 g/dL    HCT 40.7  36.0 - 46.0 %    MCV 80.4  78.0 - 100.0 fL    MCH 25.3 (*) 26.0 - 34.0 pg    MCHC 31.4  30.0 - 36.0 g/dL    RDW 15.4  11.5 - 15.5 %    Platelets 374  150 - 400 K/uL   GLUCOSE, CAPILLARY     Status: Abnormal   Collection Time   01/20/12 12:49 PM      Component Value Range Comment   Glucose-Capillary 180 (*) 70 - 99 mg/dL    Comment 1 Notify RN     CLOSTRIDIUM DIFFICILE BY PCR     Status: Normal  Collection Time   01/20/12  4:11 PM      Component  Value Range Comment   C difficile by pcr NEGATIVE  NEGATIVE   GLUCOSE, CAPILLARY     Status: Abnormal   Collection Time   01/20/12  5:21 PM      Component Value Range Comment   Glucose-Capillary 206 (*) 70 - 99 mg/dL    Comment 1 Notify RN     GLUCOSE, CAPILLARY     Status: Abnormal   Collection Time   01/20/12  9:30 PM      Component Value Range Comment   Glucose-Capillary 170 (*) 70 - 99 mg/dL    Comment 1 Notify RN     GLUCOSE, CAPILLARY     Status: Abnormal   Collection Time   01/21/12  7:31 AM      Component Value Range Comment   Glucose-Capillary 152 (*) 70 - 99 mg/dL    Comment 1 Notify RN     GLUCOSE, CAPILLARY     Status: Abnormal   Collection Time   01/21/12 11:17 AM      Component Value Range Comment   Glucose-Capillary 316 (*) 70 - 99 mg/dL    Comment 1 Notify RN        HPI :51 year old lady who presented to the emergency department today following several hours of nausea and vomiting and chest pressure. This started at 10 AM this morning. She actually threw up, had no prodromal symptoms. She described pressure in her left lower chest versus left upper quadrant of her abdomen. She received Dilaudid for the chest pressure. Her blood pressures were quite elevated greater than [id number] systolic, she was admitted to ICU and started on a Cardene drip. Subsequently blood pressure  was not responding to nicardipine drip; she was transitioned to nitroglycerin drip, after 3 nitroglycerin dropped her pain from a 10 to a 2.    HOSPITAL COURSE:   Hypertensive urgency she was titrated off nitroglycerin IV and nicardipine IV. Restarted on amlodipine 10 mg  Clonidine 0.3 mg twice a day, carvedilol 25 mg twice a day, losartan 25 mg a day. Blood pressure much better control now.   Chest pain cardiology consultation was obtained due to abnormal EKG/tachycardia. The patient was initially started on nitroglycerin drip, heparin drip, workup included a CAT scan that showed no pulmonary embolism-  cardiac enzymes negative, chest pain improved, 2-D echo was done with results as above. Per cardiology chest discomfort likely from GI source/emesis. Normal troponin.  Given possibility of severe hypokinesis of the basalinferior myocardium a followup cardiology appointment will be scheduled. The patient may need a followup stress test outpatient.     Headache/nausea/vomiting MRI of the brain  canceled due to no gross focal neurologic deficits,  continue aspirin   Urinary tract infection  started  Rocephin , urine culture negative, switched to ciprofloxacin for another 4 days  Left upper quadrant pain no obvious pathology found on CT scan   Hypokalemia repleted   Diabetes start the patient  was started on sliding scale insulin, hemoglobin A1c was found to be 9.7. The patient was initiated on Lantus at 15 units at bedtime. She will followup with her primary care provider closely. She will monitor Accu-Cheks fasting and 15 minutes preprandial   Discharge Exam:  Blood pressure 144/81, pulse 68, temperature 98.1 F (36.7 C), temperature source Oral, resp. rate 18, height 5\' 9"  (1.753 m), weight 131.6 kg (290 lb 2 oz), SpO2 96.00%.   General: Well  developed, well nourished, in no acute distress.  Head: Normocephalic and atraumatic.  Lungs: Clear to auscultation and percussion.  Heart: Normal S1 and S2. No murmur, rubs or gallops.  Abdomen: soft, slightly tender left upper quadrant, positive bowel sounds. Obese  Extremities: No clubbing or cyanosis. No edema.  Neurologic: Alert and oriented x 3.       SignedReyne Dumas 01/21/2012, 11:26 AM

## 2012-01-22 LAB — FECAL LACTOFERRIN, QUANT: Fecal Lactoferrin: NEGATIVE

## 2012-01-23 LAB — OVA AND PARASITE EXAMINATION

## 2012-01-24 LAB — STOOL CULTURE

## 2012-02-17 ENCOUNTER — Other Ambulatory Visit: Payer: Self-pay | Admitting: *Deleted

## 2012-02-17 MED ORDER — HYDROCHLOROTHIAZIDE 25 MG PO TABS: 25.0000 mg | ORAL_TABLET | Freq: Every day | ORAL | Status: DC

## 2012-02-17 MED ORDER — CLONIDINE HCL 0.3 MG PO TABS: 0.3000 mg | ORAL_TABLET | Freq: Two times a day (BID) | ORAL | Status: DC

## 2012-02-17 MED ORDER — CARVEDILOL 25 MG PO TABS: 25.0000 mg | ORAL_TABLET | Freq: Two times a day (BID) | ORAL | Status: DC

## 2012-02-19 ENCOUNTER — Ambulatory Visit (HOSPITAL_BASED_OUTPATIENT_CLINIC_OR_DEPARTMENT_OTHER): Payer: Medicaid Other | Attending: Family Medicine | Admitting: Radiology

## 2012-02-19 VITALS — Ht 69.0 in | Wt 290.0 lb

## 2012-02-19 DIAGNOSIS — G4733 Obstructive sleep apnea (adult) (pediatric): Secondary | ICD-10-CM

## 2012-02-19 DIAGNOSIS — I4949 Other premature depolarization: Secondary | ICD-10-CM | POA: Insufficient documentation

## 2012-02-19 DIAGNOSIS — G4710 Hypersomnia, unspecified: Secondary | ICD-10-CM | POA: Insufficient documentation

## 2012-02-19 DIAGNOSIS — G4730 Sleep apnea, unspecified: Secondary | ICD-10-CM | POA: Insufficient documentation

## 2012-02-22 DIAGNOSIS — G4733 Obstructive sleep apnea (adult) (pediatric): Secondary | ICD-10-CM

## 2012-02-22 DIAGNOSIS — I4949 Other premature depolarization: Secondary | ICD-10-CM

## 2012-02-22 NOTE — Procedures (Signed)
[name], [name]                  ACCOUNT NO.:  [id number]  MEDICAL RECORD NO.:  [id number]          PATIENT TYPE:  OUT  LOCATION:  SLEEP CENTER                 FACILITY[facility]  [facility]  PHYSICIAN:  Curry Seefeldt D. Annamaria Boots, MD, FCCP, FACPDATE OF BIRTH:  25-Mar-1959  DATE OF STUDY:  02/19/2012                           NOCTURNAL POLYSOMNOGRAM  REFERRING PHYSICIAN:  [name], M.D.  REFERRING PHYSICIAN:  Standley Dakins. Kennon Rounds, MD  INDICATION FOR STUDY:  Hypersomnia with sleep apnea.  EPWORTH SLEEPINESS SCORE:  14/24.  BMI 42.8, weight 290 pounds, height 69 inches, neck 14.5 inches.  MEDICATIONS:  Home medications are charted and reviewed.  A baseline diagnostic NPSG on August 14, 2011 had recorded an AHI of 16.9 per hour.  Body weight for that initial study was 290 pounds.  CPAP titration is now requested.  SLEEP ARCHITECTURE:  Total sleep time 227 minutes with sleep efficiency 57.9%.  Stage I was 16.7%, stage II 76.2%.  Stage III absent, REM 7% of total sleep time.  Sleep latency 4 minutes.  REM latency 171 minutes. Awake after sleep onset 144.5 minutes.  Arousal index 23.3.  Bedtime medication:  None.  She was awake most of the night after 2 a.m. Difficulty initiating and maintaining sleep had been noted on her diagnostic study on August 14, 2011.  RESPIRATORY DATA:  CPAP titration protocol.  CPAP was titrated to 15 CWP with inadequate control and a residual AHI of 60 per hour.  Best pressure had been recorded at 8 CWP with AHI 2.5 per hour.  She wore a medium ResMed Mirage Quattro full-face mask with heated humidifier and a C-Flex setting of 3.  She was unable to maintain sleep on higher pressures.  OXYGEN DATA:  Snoring was prevented by CPAP with mean oxygen saturation 92.7% on room air.  CARDIAC DATA:  Sinus rhythm with multifocal PVCs.  MOVEMENT/PARASOMNIA:  No significant movement disturbance.  No bathroom trips.  IMPRESSION/RECOMMENDATION: 1. CPAP was titrated to a best pressure  of 8 CWP, AHI 2.5 per hour.     She was unable to maintain sleep on higher pressures.  A few     residual events were noted up to the maximum utilized pressure of     15 on this study.  Recommended pressure of 8 for initial home use.     She wore a medium ResMed Mirage Quattro full-face mask with heated     humidifier and a C-Flex setting of 3. 2. Baseline diagnostic NPSG on August 14, 2011 had recorded an AHI of     16.9 per hour.  Body weight was recorded 290 pounds for that study.     Josiah Wojtaszek D. Annamaria Boots, MD, [facility], FACP Diplomate, [place] Board of Sleep Medicine    CDY/MEDQ  D:  02/22/2012 12:09:51  T:  02/22/2012 13:36:14  Job:  [id number]

## 2012-02-26 ENCOUNTER — Other Ambulatory Visit: Payer: Self-pay | Admitting: *Deleted

## 2012-02-27 MED ORDER — AMLODIPINE BESYLATE 10 MG PO TABS: 10.0000 mg | ORAL_TABLET | Freq: Every day | ORAL | Status: DC

## 2012-05-21 ENCOUNTER — Encounter: Payer: Medicaid Other | Admitting: Family Medicine

## 2012-05-28 ENCOUNTER — Encounter: Payer: Medicaid Other | Admitting: Family Medicine

## 2012-06-10 ENCOUNTER — Other Ambulatory Visit: Payer: Self-pay | Admitting: Family Medicine

## 2012-06-11 ENCOUNTER — Other Ambulatory Visit: Payer: Self-pay | Admitting: Family Medicine

## 2012-07-14 ENCOUNTER — Other Ambulatory Visit: Payer: Self-pay | Admitting: Family Medicine

## 2012-08-17 ENCOUNTER — Other Ambulatory Visit: Payer: Self-pay | Admitting: Family Medicine

## 2012-08-19 ENCOUNTER — Other Ambulatory Visit: Payer: Self-pay | Admitting: Family Medicine

## 2012-08-19 MED ORDER — TRAMADOL HCL 50 MG PO TABS: 50.0000 mg | ORAL_TABLET | Freq: Four times a day (QID) | ORAL | Status: DC | PRN

## 2012-09-03 ENCOUNTER — Telehealth: Payer: Self-pay | Admitting: *Deleted

## 2012-09-03 ENCOUNTER — Encounter: Payer: Self-pay | Admitting: Family Medicine

## 2012-09-03 ENCOUNTER — Ambulatory Visit (INDEPENDENT_AMBULATORY_CARE_PROVIDER_SITE_OTHER): Payer: Medicaid Other | Admitting: Family Medicine

## 2012-09-03 VITALS — BP 130/76 | HR 76 | Ht 69.0 in | Wt 286.5 lb

## 2012-09-03 DIAGNOSIS — E7800 Pure hypercholesterolemia, unspecified: Secondary | ICD-10-CM

## 2012-09-03 DIAGNOSIS — E119 Type 2 diabetes mellitus without complications: Secondary | ICD-10-CM

## 2012-09-03 DIAGNOSIS — I10 Essential (primary) hypertension: Secondary | ICD-10-CM

## 2012-09-03 DIAGNOSIS — G4733 Obstructive sleep apnea (adult) (pediatric): Secondary | ICD-10-CM

## 2012-09-03 DIAGNOSIS — M1710 Unilateral primary osteoarthritis, unspecified knee: Secondary | ICD-10-CM

## 2012-09-03 DIAGNOSIS — K219 Gastro-esophageal reflux disease without esophagitis: Secondary | ICD-10-CM

## 2012-09-03 DIAGNOSIS — E876 Hypokalemia: Secondary | ICD-10-CM

## 2012-09-03 DIAGNOSIS — E669 Obesity, unspecified: Secondary | ICD-10-CM

## 2012-09-03 DIAGNOSIS — M170 Bilateral primary osteoarthritis of knee: Secondary | ICD-10-CM

## 2012-09-03 LAB — POCT GLYCOSYLATED HEMOGLOBIN (HGB A1C): Hemoglobin A1C: 8.3

## 2012-09-03 MED ORDER — OMEPRAZOLE 20 MG PO CPDR: 20.0000 mg | DELAYED_RELEASE_CAPSULE | Freq: Two times a day (BID) | ORAL | Status: DC

## 2012-09-03 MED ORDER — AMLODIPINE BESYLATE 10 MG PO TABS: 10.0000 mg | ORAL_TABLET | Freq: Every day | ORAL | Status: DC

## 2012-09-03 MED ORDER — CLONIDINE HCL 0.3 MG PO TABS: 0.3000 mg | ORAL_TABLET | Freq: Two times a day (BID) | ORAL | Status: DC

## 2012-09-03 MED ORDER — TRAMADOL HCL 50 MG PO TABS: 50.0000 mg | ORAL_TABLET | Freq: Four times a day (QID) | ORAL | Status: DC | PRN

## 2012-09-03 MED ORDER — ASPIRIN 325 MG PO TABS: 325.0000 mg | ORAL_TABLET | Freq: Every day | ORAL | Status: DC

## 2012-09-03 MED ORDER — INSULIN GLARGINE 100 UNIT/ML ~~LOC~~ SOLN: 15.0000 [IU] | Freq: Every day | SUBCUTANEOUS | Status: DC

## 2012-09-03 MED ORDER — INSULIN GLARGINE 100 UNIT/ML ~~LOC~~ SOLN: 20.0000 [IU] | Freq: Every day | SUBCUTANEOUS | Status: DC

## 2012-09-03 MED ORDER — CARVEDILOL 25 MG PO TABS: 25.0000 mg | ORAL_TABLET | Freq: Two times a day (BID) | ORAL | Status: DC

## 2012-09-03 MED ORDER — POTASSIUM CHLORIDE CRYS ER 20 MEQ PO TBCR: 20.0000 meq | EXTENDED_RELEASE_TABLET | Freq: Two times a day (BID) | ORAL | Status: DC

## 2012-09-03 MED ORDER — METFORMIN HCL 500 MG PO TABS: 500.0000 mg | ORAL_TABLET | Freq: Two times a day (BID) | ORAL | Status: DC

## 2012-09-03 MED ORDER — LOSARTAN POTASSIUM 25 MG PO TABS: 25.0000 mg | ORAL_TABLET | Freq: Every day | ORAL | Status: DC

## 2012-09-03 MED ORDER — HYDROCHLOROTHIAZIDE 25 MG PO TABS: 25.0000 mg | ORAL_TABLET | Freq: Every day | ORAL | Status: DC

## 2012-09-03 NOTE — Telephone Encounter (Signed)
Clarification given to pharmacy regarding e prescription of lanuts - no further questions Jonaven Hilgers, Ines Bloomer, RN

## 2012-09-03 NOTE — Patient Instructions (Signed)
Exercise to Lose Weight Exercise and a healthy diet may help you lose weight. Your doctor may suggest specific exercises. EXERCISE IDEAS AND TIPS  Choose low-cost things you enjoy doing, such as walking, bicycling, or exercising to workout videos.  Take stairs instead of the elevator.  Walk during your lunch break.  Park your car further away from work or school.  Go to a gym or an exercise class.  Start with 5 to 10 minutes of exercise each day. Build up to 30 minutes of exercise 4 to 6 days a week.  Wear shoes with good support and comfortable clothes.  Stretch before and after working out.  Work out until you breathe harder and your heart beats faster.  Drink extra water when you exercise.  Do not do so much that you hurt yourself, feel dizzy, or get very short of breath. Exercises that burn about 150 calories:  Running 1  miles in 15 minutes.  Playing volleyball for 45 to 60 minutes.  Washing and waxing a car for 45 to 60 minutes.  Playing touch football for 45 minutes.  Walking 1  miles in 35 minutes.  Pushing a stroller 1  miles in 30 minutes.  Playing basketball for 30 minutes.  Raking leaves for 30 minutes.  Bicycling 5 miles in 30 minutes.  Walking 2 miles in 30 minutes.  Dancing for 30 minutes.  Shoveling snow for 15 minutes.  Swimming laps for 20 minutes.  Walking up stairs for 15 minutes.  Bicycling 4 miles in 15 minutes.  Gardening for 30 to 45 minutes.  Jumping rope for 15 minutes.  Washing windows or floors for 45 to 60 minutes. Document Released: 05/25/2010 Document Revised: 07/15/2011 Document Reviewed: 05/25/2010 [facility] Patient Information 2013 [place]. Diabetes and Foot Care Diabetes may cause you to have a poor blood supply (circulation) to your legs and feet. Because of this, the skin may be thinner, break easier, and heal more slowly. You also may have nerve damage in your legs and feet causing decreased feeling. You  may not notice minor injuries to your feet that could lead to serious problems or infections. Taking care of your feet is one of the most important things you can do for yourself.  HOME CARE INSTRUCTIONS  Do not go barefoot. Bare feet are easily injured.  Check your feet daily for blisters, cuts, and redness.  Wash your feet with warm water (not hot) and mild soap. Pat your feet and between your toes until completely dry.  Apply a moisturizing lotion that does not contain alcohol or petroleum jelly to the dry skin on your feet and to dry brittle toenails. Do not put it between your toes.  Trim your toenails straight across. Do not dig under them or around the cuticle.  Do not cut corns or calluses, or try to remove them with medicine.  Wear clean cotton socks or stockings every day. Make sure they are not too tight. Do not wear knee high stockings since they may decrease blood flow to your legs.  Wear leather shoes that fit properly and have enough cushioning. To break in new shoes, wear them just a few hours a day to avoid injuring your feet.  Wear shoes at all times, even in the house.  Do not cross your legs. This may decrease the blood flow to your feet.  If you find a minor scrape, cut, or break in the skin on your feet, keep it and the skin around it  clean and dry. These areas may be cleansed with mild soap and water. Do not use peroxide, alcohol, iodine or Merthiolate.  When you remove an adhesive bandage, be sure not to harm the skin around it.  If you have a wound, look at it several times a day to make sure it is healing.  Do not use heating pads or hot water bottles. Burns can occur. If you have lost feeling in your feet or legs, you may not know it is happening until it is too late.  Report any cuts, sores or bruises to your caregiver. Do not wait! SEEK MEDICAL CARE IF:   You have an injury that is not healing or you notice redness, numbness, burning, or  tingling.  Your feet always feel cold.  You have pain or cramps in your legs and feet. SEEK IMMEDIATE MEDICAL CARE IF:   There is increasing redness, swelling, or increasing pain in the wound.  There is a red line that goes up your leg.  Pus is coming from a wound.  You develop an unexplained oral temperature above 102 F (38.9 C), or as your caregiver suggests.  You notice a bad smell coming from an ulcer or wound. MAKE SURE YOU:   Understand these instructions.  Will watch your condition.  Will get help right away if you are not doing well or get worse. Document Released: 04/19/2000 Document Revised: 07/15/2011 Document Reviewed: 10/26/2008 [facility] Patient Information 2013 [place].

## 2012-09-03 NOTE — Progress Notes (Signed)
Patient Identified Concern:  Dm managment Stage of Change Patient Is In:  Preparation, pt making changes for less than 6 months Patient Reported Barriers:  Lack of understanding disease management, Patient Reported Perceived Benefits:  Not having to take as much medication. Patient Reports Self-Efficacy:   Pt displays some self efficacy based on successful behavioral changes in the past.  Quit smoking 2013. Behavior Change Supports:  Daughter/family Goals:  To reduce the amount of fruits she is juicing, increase vegetables, eat 1/2 cup of scoopable carbohydrates, walk 3x this next week. Patient Education:  We talked about foods the raise blood sugar, importance of exercise.

## 2012-09-04 LAB — MICROALBUMIN / CREATININE URINE RATIO: Microalb Creat Ratio: 31.2 mg/g — ABNORMAL HIGH (ref 0.0–30.0)

## 2012-09-05 ENCOUNTER — Encounter: Payer: Self-pay | Admitting: Family Medicine

## 2012-09-05 NOTE — Assessment & Plan Note (Signed)
Continue to work on weight loss and exercise.

## 2012-09-05 NOTE — Assessment & Plan Note (Signed)
CPAP ordered with the following settings: 8 CWP, heated humidifier, and C-flex setting 3.

## 2012-09-05 NOTE — Progress Notes (Signed)
  Subjective:    Patient ID: [name], female    DOB: 06-09-1960, 52 y.o.   MRN: [id number]  Diabetes Pertinent negatives for hypoglycemia include no dizziness or headaches. Pertinent negatives for diabetes include no chest pain.    Here for annual exam.  A1C is better.  She is continuing to juice.  She is needing medication refill.  She declines pneumovax at this time.  Review of Systems  Constitutional: Negative for fever and chills.  HENT: Negative for nosebleeds, congestion and rhinorrhea.   Eyes: Negative for visual disturbance.  Respiratory: Negative for chest tightness and shortness of breath.   Cardiovascular: Negative for chest pain.  Gastrointestinal: Negative for nausea, vomiting, abdominal pain, diarrhea, constipation, blood in stool and abdominal distention.  Genitourinary: Negative for dysuria, frequency, vaginal bleeding and menstrual problem.  Musculoskeletal: Negative for arthralgias.  Skin: Negative for rash.  Neurological: Negative for dizziness and headaches.  Psychiatric/Behavioral: Negative for behavioral problems, sleep disturbance and dysphoric mood.  All other systems reviewed and are negative.       Objective:   Physical Exam  Vitals reviewed. Constitutional: She is oriented to person, place, and time. She appears well-developed and well-nourished. No distress.  HENT:  Head: Normocephalic and atraumatic.  Eyes: No scleral icterus.  Neck: Neck supple. No thyromegaly present.  Cardiovascular: Normal rate and regular rhythm.   No murmur heard. Pulmonary/Chest: Effort normal. She has no wheezes.  Abdominal: Soft. There is no tenderness.  Musculoskeletal: Normal range of motion. She exhibits no tenderness.  Neurological: She is alert and oriented to person, place, and time.  Skin: Skin is warm and dry. No rash noted.  Psychiatric: She has a normal mood and affect.          Assessment & Plan:

## 2012-09-05 NOTE — Assessment & Plan Note (Signed)
Meds refilled.  BP in excellent control.

## 2012-09-05 NOTE — Assessment & Plan Note (Signed)
Saw Health coach today, continue to address dietary issues and addition of exercise.  She is to work on weight loss.  Will increase her Lantus by 5 units at HS.  Microalbuminuria is stable.  Optho referral made.

## 2012-09-05 NOTE — Assessment & Plan Note (Addendum)
Not taking Zocor, as it led to muscle pain.  Will re-check at next visit and consider alternative therapy.

## 2012-09-05 NOTE — Assessment & Plan Note (Signed)
Continues to have issues, she is not ready for ortho referral.

## 2012-09-07 ENCOUNTER — Other Ambulatory Visit: Payer: Self-pay | Admitting: Family Medicine

## 2012-09-07 DIAGNOSIS — G4733 Obstructive sleep apnea (adult) (pediatric): Secondary | ICD-10-CM

## 2012-09-09 ENCOUNTER — Other Ambulatory Visit: Payer: Self-pay | Admitting: Family Medicine

## 2012-09-09 DIAGNOSIS — G4733 Obstructive sleep apnea (adult) (pediatric): Secondary | ICD-10-CM

## 2012-09-09 DIAGNOSIS — E669 Obesity, unspecified: Secondary | ICD-10-CM

## 2012-09-11 ENCOUNTER — Other Ambulatory Visit: Payer: Self-pay | Admitting: Family Medicine

## 2012-09-30 ENCOUNTER — Ambulatory Visit (HOSPITAL_BASED_OUTPATIENT_CLINIC_OR_DEPARTMENT_OTHER): Payer: Medicaid Other

## 2012-10-19 ENCOUNTER — Other Ambulatory Visit: Payer: Self-pay | Admitting: *Deleted

## 2012-10-19 MED ORDER — HYDROCHLOROTHIAZIDE 25 MG PO TABS: 25.0000 mg | ORAL_TABLET | Freq: Every day | ORAL | Status: DC

## 2012-11-12 ENCOUNTER — Other Ambulatory Visit: Payer: Self-pay

## 2012-11-16 ENCOUNTER — Other Ambulatory Visit: Payer: Self-pay | Admitting: *Deleted

## 2012-11-16 MED ORDER — HYDROCHLOROTHIAZIDE 25 MG PO TABS: 25.0000 mg | ORAL_TABLET | Freq: Every day | ORAL | Status: DC

## 2012-12-22 ENCOUNTER — Other Ambulatory Visit: Payer: Self-pay | Admitting: *Deleted

## 2012-12-23 ENCOUNTER — Encounter: Payer: Self-pay | Admitting: Home Health Services

## 2012-12-23 ENCOUNTER — Telehealth: Payer: Self-pay | Admitting: Family Medicine

## 2012-12-23 NOTE — Telephone Encounter (Signed)
Will forward to MD. Wilmore Holsomback,CMA  

## 2012-12-23 NOTE — Telephone Encounter (Signed)
[phone] Pt wants to know when the ffuild pill will be called in Please advise

## 2012-12-25 MED ORDER — HYDROCHLOROTHIAZIDE 25 MG PO TABS: 25.0000 mg | ORAL_TABLET | Freq: Every day | ORAL | Status: DC

## 2012-12-25 NOTE — Telephone Encounter (Signed)
It was sent over as rx refill.

## 2013-01-26 ENCOUNTER — Telehealth: Payer: Self-pay | Admitting: Family Medicine

## 2013-01-26 ENCOUNTER — Other Ambulatory Visit: Payer: Self-pay | Admitting: Family Medicine

## 2013-01-26 DIAGNOSIS — M170 Bilateral primary osteoarthritis of knee: Secondary | ICD-10-CM

## 2013-01-26 MED ORDER — TRAMADOL HCL 50 MG PO TABS: 50.0000 mg | ORAL_TABLET | Freq: Four times a day (QID) | ORAL | Status: DC | PRN

## 2013-01-26 NOTE — Telephone Encounter (Signed)
Patient calls stating that she requested a refill from her pharmacy about 2 weeks ago from Tramad[place], and the pharmacy stated that they never received anything from [place]. Patient is out of medicine. Pls call patient when med are ready for pickup or called in.

## 2013-01-26 NOTE — Telephone Encounter (Signed)
Do not see any refill request from pharmacy.  Will forward to MD. [name], [name]

## 2013-01-26 NOTE — Telephone Encounter (Signed)
Pt informed. Matt Delpizzo Dawn  

## 2013-01-26 NOTE — Progress Notes (Signed)
Rx called in.Clinton Sawyer, Salome Spotted

## 2013-02-01 ENCOUNTER — Ambulatory Visit: Payer: Medicaid Other | Admitting: Family Medicine

## 2013-02-17 ENCOUNTER — Ambulatory Visit: Payer: Medicaid Other | Admitting: Family Medicine

## 2013-02-23 ENCOUNTER — Other Ambulatory Visit: Payer: Self-pay | Admitting: Family Medicine

## 2013-03-11 ENCOUNTER — Other Ambulatory Visit: Payer: Self-pay

## 2013-03-30 ENCOUNTER — Telehealth: Payer: Self-pay | Admitting: *Deleted

## 2013-03-30 NOTE — Telephone Encounter (Signed)
Prior authorization form for losartan placed in MD box, please return to [place] or [place] when complete.

## 2013-03-31 ENCOUNTER — Telehealth: Payer: Self-pay | Admitting: Family Medicine

## 2013-03-31 DIAGNOSIS — I10 Essential (primary) hypertension: Secondary | ICD-10-CM

## 2013-03-31 MED ORDER — CLONIDINE HCL 0.3 MG PO TABS: 0.3000 mg | ORAL_TABLET | Freq: Two times a day (BID) | ORAL | Status: DC

## 2013-03-31 NOTE — Telephone Encounter (Signed)
Dr. Kennon Rounds, Medicaid requires a trial and failure of an ACE inhibitor before losartan will be covered. Patient out of bp med for a week, can something else be sent in?

## 2013-03-31 NOTE — Telephone Encounter (Signed)
Emergency Line / After Hours Call  Patient called the emergency line because she has been out of her clonidine, which she takes for hypertension. Has been having a headache due to being out of her medicine. Requests refill. States she tried to call the clinic for a refill on this but has not heard back from her PCP. I explained that we do not give out refills over the emergency line, but because this is a holiday weekend I will make an exception. Sent in rx for 30 days worth of clonidine with no refills. Advised pt that as she has not been seen in our clinic since May that she is due for another appointment to be seen by her PCP. Advised that if she is feeling poorly that she can always come into the ER for evaluation tonight. Pt understood these instructions.  Chrisandra Netters, MD Family Medicine PGY-2

## 2013-03-31 NOTE — Telephone Encounter (Signed)
Patient calls today. Has been without med for a week. Dr. Kennon Rounds will not be back in clinic until 12/11. Is there anyone else that could complete prior-auth form? Please call patient.

## 2013-04-01 NOTE — Telephone Encounter (Signed)
The patient is allergic to ACE-inhibitors. I will come by on Monday to fill out the form.

## 2013-04-05 NOTE — Telephone Encounter (Signed)
Completed form given to [place] to put into [employer]

## 2013-04-06 NOTE — Telephone Encounter (Signed)
Prior authorization completed in [place].  Approval pending.  Will recheck site in 24 hours.  Nolene Ebbs, RN

## 2013-04-07 NOTE — Telephone Encounter (Signed)
Prior approval for losartan completed via [place] [employer].  Med approved for 04/06/13 - 04/06/14  Prior approval # [id number].  [place] informed.  Nolene Ebbs, RN

## 2013-04-21 ENCOUNTER — Ambulatory Visit: Payer: Medicaid Other | Admitting: Family Medicine

## 2013-04-30 ENCOUNTER — Other Ambulatory Visit: Payer: Self-pay | Admitting: Family Medicine

## 2013-05-03 ENCOUNTER — Telehealth: Payer: Self-pay | Admitting: Family Medicine

## 2013-05-03 NOTE — Telephone Encounter (Signed)
Tried to call pt but the number listed does not work.  Please ask her which medications she needs filled and inform that she also needs to make an appt with Dr. Kennon Rounds since she didn't make the last one scheduled.  [name],CMA

## 2013-05-03 NOTE — Telephone Encounter (Signed)
Pt called because she is still waiting on her medications to be refilled. She has only received one and still has three more that need refilling. jw

## 2013-05-12 ENCOUNTER — Ambulatory Visit (INDEPENDENT_AMBULATORY_CARE_PROVIDER_SITE_OTHER): Payer: Medicaid Other | Admitting: Family Medicine

## 2013-05-12 ENCOUNTER — Encounter: Payer: Self-pay | Admitting: Family Medicine

## 2013-05-12 VITALS — BP 166/97 | HR 82 | Temp 98.8°F | Ht 69.0 in | Wt 276.0 lb

## 2013-05-12 DIAGNOSIS — K219 Gastro-esophageal reflux disease without esophagitis: Secondary | ICD-10-CM

## 2013-05-12 DIAGNOSIS — E119 Type 2 diabetes mellitus without complications: Secondary | ICD-10-CM

## 2013-05-12 DIAGNOSIS — I10 Essential (primary) hypertension: Secondary | ICD-10-CM

## 2013-05-12 DIAGNOSIS — E1165 Type 2 diabetes mellitus with hyperglycemia: Principal | ICD-10-CM

## 2013-05-12 DIAGNOSIS — E876 Hypokalemia: Secondary | ICD-10-CM

## 2013-05-12 DIAGNOSIS — M1710 Unilateral primary osteoarthritis, unspecified knee: Secondary | ICD-10-CM

## 2013-05-12 DIAGNOSIS — IMO0001 Reserved for inherently not codable concepts without codable children: Secondary | ICD-10-CM

## 2013-05-12 DIAGNOSIS — IMO0002 Reserved for concepts with insufficient information to code with codable children: Secondary | ICD-10-CM

## 2013-05-12 DIAGNOSIS — M170 Bilateral primary osteoarthritis of knee: Secondary | ICD-10-CM

## 2013-05-12 LAB — POCT GLYCOSYLATED HEMOGLOBIN (HGB A1C): Hemoglobin A1C: 7.3

## 2013-05-12 MED ORDER — HYDROCHLOROTHIAZIDE 25 MG PO TABS: 25.0000 mg | ORAL_TABLET | Freq: Every day | ORAL | Status: DC

## 2013-05-12 MED ORDER — AMLODIPINE BESYLATE 10 MG PO TABS: 10.0000 mg | ORAL_TABLET | Freq: Every day | ORAL | Status: DC

## 2013-05-12 MED ORDER — OMEPRAZOLE 20 MG PO CPDR: 20.0000 mg | DELAYED_RELEASE_CAPSULE | Freq: Two times a day (BID) | ORAL | Status: DC

## 2013-05-12 MED ORDER — CARVEDILOL 25 MG PO TABS: 25.0000 mg | ORAL_TABLET | Freq: Two times a day (BID) | ORAL | Status: DC

## 2013-05-12 MED ORDER — TRAMADOL HCL 50 MG PO TABS: 50.0000 mg | ORAL_TABLET | Freq: Four times a day (QID) | ORAL | Status: DC | PRN

## 2013-05-12 MED ORDER — METFORMIN HCL 500 MG PO TABS: 500.0000 mg | ORAL_TABLET | Freq: Two times a day (BID) | ORAL | Status: DC

## 2013-05-12 MED ORDER — EPINEPHRINE 0.3 MG/0.3ML IJ SOAJ: 0.3000 mg | Freq: Once | INTRAMUSCULAR | Status: DC

## 2013-05-12 MED ORDER — PROMETHAZINE HCL 25 MG PO TABS: ORAL_TABLET | ORAL | Status: DC

## 2013-05-12 MED ORDER — LOSARTAN POTASSIUM 25 MG PO TABS: 25.0000 mg | ORAL_TABLET | Freq: Every day | ORAL | Status: DC

## 2013-05-12 NOTE — Assessment & Plan Note (Signed)
Out of medications for some time. 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 Pt also w/ benadryl at home Pt aware of risk of medication

## 2013-05-12 NOTE — Assessment & Plan Note (Addendum)
Refill metformin.  Cont lantus 20.  Home CBG appropriate A1c improving despite Holiday binging per pt

## 2013-05-12 NOTE — Progress Notes (Signed)
[name] is a 53 y.o. female who presents to [facility] today for HTN and DM refills  HTN: out of all bp meds accept for clonidine for a couple of weeks. Deneis CP, SOB, palpitations, syncope  DM: CBG at home lately been around mid to high 100's. Out of metformin. No significant/symptomatic low or high sugars. Taking 20 units lantus daily  GERD: out of prilosec. Flares w/ spicy foods.   The following portions of the patient's history were reviewed and updated as appropriate: allergies, current medications, past medical history, family and social history, and problem list.    Past Medical History  Diagnosis Date  . Diabetes mellitus   . Hypertension   . GERD (gastroesophageal reflux disease)   . Nausea     ROS as above otherwise neg.    Medications reviewed. Current Outpatient Prescriptions  Medication Sig Dispense Refill  . amLODipine (NORVASC) 10 MG tablet Take 1 tablet (10 mg total) by mouth daily.  30 tablet  5  . aspirin 325 MG tablet Take 1 tablet (325 mg total) by mouth daily.  30 tablet  5  . carvedilol (COREG) 25 MG tablet Take 1 tablet (25 mg total) by mouth 2 (two) times daily with a meal.  60 tablet  5  . cloNIDine (CATAPRES) 0.3 MG tablet TAKE ONE TABLET BY MOUTH TWICE DAILY  60 tablet  0  . EPINEPHrine (EPIPEN) 0.3 mg/0.3 mL SOAJ injection Inject 0.3 mLs (0.3 mg total) into the muscle once.  2 Device  2  . hydrochlorothiazide (HYDRODIURIL) 25 MG tablet Take 1 tablet (25 mg total) by mouth daily.  30 tablet  3  . insulin glargine (LANTUS) 100 UNIT/ML injection Inject 0.2 mLs (20 Units total) into the skin daily.  100 mL  10  . Insulin Syringe-Needle U-100 (INSULIN SYRINGE .3CC/29GX1/2") 29G X 1/2" 0.3 ML MISC Insulin syringes  100 each  2  . Lancets (ACCU-CHEK SOFT TOUCH) lancets Use as instructed  100 each  12  . losartan (COZAAR) 25 MG tablet Take 1 tablet (25 mg total) by mouth daily.  30 tablet  5  . metFORMIN (GLUCOPHAGE) 500 MG tablet Take 1 tablet (500 mg total) by  mouth 2 (two) times daily with a meal.  60 tablet  5  . omeprazole (PRILOSEC) 20 MG capsule Take 1 capsule (20 mg total) by mouth 2 (two) times daily.  60 capsule  5  . potassium chloride (K-DUR) 10 MEQ tablet Take 20 mEq by mouth 2 (two) times daily.      . potassium chloride SA (K-DUR,KLOR-CON) 20 MEQ tablet Take 1 tablet (20 mEq total) by mouth 2 (two) times daily.  60 tablet  5  . promethazine (PHENERGAN) 25 MG tablet TAKE ONE-HALF TO ONE TABLET BY MOUTH 4 TIMES DAILY  42 tablet  0  . simvastatin (ZOCOR) 10 MG tablet Take 10 mg by mouth at bedtime.      . traMADol (ULTRAM) 50 MG tablet Take 1 tablet (50 mg total) by mouth every 6 (six) hours as needed.  42 tablet  5   No current facility-administered medications for this visit.    Exam:  BP 166/97  Pulse 82  Temp(Src) 98.8 F (37.1 C) (Oral)  Ht 5\' 9"  (1.753 m)  Wt 276 lb (125.193 kg)  BMI 40.74 kg/m2 Gen: Well NAD HEENT: EOMI,  MMM Lungs: CTABL Nl WOB Heart: RRR no MRG Abd: NABS, NT, ND Exts: Non edematous BL  LE, warm and well perfused.  Results for orders placed in visit on 05/12/13 (from the past 72 hour(s))  POCT GLYCOSYLATED HEMOGLOBIN (HGB A1C)     Status: None   Collection Time    05/12/13  2:36 PM      Result Value Range   Hemoglobin A1C 7.3      A/P (as seen in Problem list)  HTN (hypertension), malignant Out of medications for some time. 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 Pt also w/ benadryl at home Pt aware of risk of medication  Type II or unspecified type diabetes mellitus without mention of complication, uncontrolled Refill metformin.  Cont lantus 20.  Home CBG appropriate A1c improving despite Holiday binging per pt   GERD Refill Prilosec

## 2013-05-12 NOTE — Patient Instructions (Signed)
I have refilled all of yoru medicine  Please restart all of your medications as prescribed If you become dizzy please decrease how many of tyour blood pressure medications you are taking Pelase also keep an Epi pen and benadryl with you at all times in case your tongue and mouth start swelling Follow up with Dr. Kennon Rounds as needed

## 2013-05-19 ENCOUNTER — Other Ambulatory Visit: Payer: Self-pay | Admitting: Family Medicine

## 2013-07-01 ENCOUNTER — Other Ambulatory Visit: Payer: Self-pay | Admitting: Family Medicine

## 2013-07-21 ENCOUNTER — Telehealth: Payer: Self-pay | Admitting: Family Medicine

## 2013-07-21 NOTE — Telephone Encounter (Signed)
Needs tramadol refilled [facility] on [address]

## 2013-07-22 ENCOUNTER — Other Ambulatory Visit: Payer: Self-pay | Admitting: Family Medicine

## 2013-07-22 MED ORDER — TRAMADOL HCL 50 MG PO TABS: 50.0000 mg | ORAL_TABLET | Freq: Four times a day (QID) | ORAL | Status: DC | PRN

## 2013-07-22 NOTE — Telephone Encounter (Signed)
Rx phoned in.  LMOVM informing patient that "rx you requested was called into the pharmacy". [name], [name]

## 2013-08-07 ENCOUNTER — Other Ambulatory Visit: Payer: Self-pay | Admitting: Family Medicine

## 2013-09-10 ENCOUNTER — Other Ambulatory Visit: Payer: Self-pay | Admitting: Family Medicine

## 2013-10-15 ENCOUNTER — Other Ambulatory Visit: Payer: Self-pay | Admitting: Family Medicine

## 2013-10-18 ENCOUNTER — Other Ambulatory Visit: Payer: Self-pay | Admitting: *Deleted

## 2013-10-19 MED ORDER — CLONIDINE HCL 0.3 MG PO TABS: 0.3000 mg | ORAL_TABLET | Freq: Two times a day (BID) | ORAL | Status: DC

## 2013-10-19 MED ORDER — HYDROCHLOROTHIAZIDE 25 MG PO TABS: 25.0000 mg | ORAL_TABLET | Freq: Every day | ORAL | Status: DC

## 2013-10-25 ENCOUNTER — Telehealth: Payer: Self-pay | Admitting: Family Medicine

## 2013-10-27 NOTE — Telephone Encounter (Signed)
Called Pt about DM care. When Pt calls back please schedule appointment to check LDL and BP.

## 2013-11-21 ENCOUNTER — Other Ambulatory Visit: Payer: Self-pay | Admitting: Family Medicine

## 2013-11-29 ENCOUNTER — Other Ambulatory Visit: Payer: Self-pay | Admitting: Family Medicine

## 2013-11-29 NOTE — Telephone Encounter (Signed)
Rx called in . Shonteria Abeln, Salome Spotted

## 2013-12-26 ENCOUNTER — Other Ambulatory Visit: Payer: Self-pay | Admitting: Family Medicine

## 2013-12-27 ENCOUNTER — Other Ambulatory Visit: Payer: Self-pay | Admitting: Family Medicine

## 2013-12-27 NOTE — Telephone Encounter (Signed)
Rx called in. Leane Loring, Salome Spotted

## 2014-04-15 ENCOUNTER — Encounter: Payer: Self-pay | Admitting: *Deleted

## 2014-04-15 NOTE — Progress Notes (Signed)
Prior Authorization received from losartan pharmacy for [place]. Formulary and PA form placed in provider box for completion. Derl Barrow, RN

## 2014-04-20 ENCOUNTER — Other Ambulatory Visit: Payer: Self-pay | Admitting: *Deleted

## 2014-04-20 DIAGNOSIS — E1165 Type 2 diabetes mellitus with hyperglycemia: Secondary | ICD-10-CM

## 2014-04-20 DIAGNOSIS — IMO0002 Reserved for concepts with insufficient information to code with codable children: Secondary | ICD-10-CM

## 2014-04-21 MED ORDER — METFORMIN HCL 500 MG PO TABS: 500.0000 mg | ORAL_TABLET | Freq: Two times a day (BID) | ORAL | Status: DC

## 2014-04-26 NOTE — Progress Notes (Signed)
PA for losartan 25 mg pending per [place] [employer].  Confirmation number [id number] Lilia Pro, RN

## 2014-05-02 ENCOUNTER — Telehealth: Payer: Self-pay | Admitting: Family Medicine

## 2014-05-02 NOTE — Telephone Encounter (Signed)
Appt made for patient to be seen for DM 05/11/14.  She stopped taking her insulin on her own and informed her daughter just recently that it has been months since she took it.  Daughter is concerned that it could be life threatening.  Please advise if there is anything she needs to be doing before her appt. [name],CMA

## 2014-05-02 NOTE — Telephone Encounter (Signed)
Pt currently prescribed metformin and insulin, does not want to take insulin, wants to know if this is ok?

## 2014-05-03 NOTE — Telephone Encounter (Signed)
Over an extended period of time, there may be consequences, but she should be ok until her appointment.

## 2014-05-03 NOTE — Progress Notes (Signed)
Received PA approval for Losartan via [place] [employer].  Med approved for 04/26/2014 - 04/26/2015.  [facility] pharmacy informed.  PA confirmation number [id number] Teresita Madura, Rosine Beat, RN

## 2014-05-03 NOTE — Telephone Encounter (Signed)
Daughter is aware of this and appt date and time confirmed. Zaedyn Covin,CMA

## 2014-05-11 ENCOUNTER — Other Ambulatory Visit (HOSPITAL_COMMUNITY)
Admission: RE | Admit: 2014-05-11 | Discharge: 2014-05-11 | Disposition: A | Discharge status: Home / Self Care | Payer: Medicaid Other | Source: Ambulatory Visit | Attending: Family Medicine | Admitting: Family Medicine

## 2014-05-11 ENCOUNTER — Ambulatory Visit (INDEPENDENT_AMBULATORY_CARE_PROVIDER_SITE_OTHER): Payer: Medicaid Other | Admitting: Family Medicine

## 2014-05-11 ENCOUNTER — Encounter: Payer: Self-pay | Admitting: Family Medicine

## 2014-05-11 VITALS — BP 157/83 | HR 78 | Temp 98.1°F | Ht 69.0 in | Wt 284.0 lb

## 2014-05-11 DIAGNOSIS — I10 Essential (primary) hypertension: Secondary | ICD-10-CM

## 2014-05-11 DIAGNOSIS — K219 Gastro-esophageal reflux disease without esophagitis: Secondary | ICD-10-CM

## 2014-05-11 DIAGNOSIS — M545 Low back pain, unspecified: Secondary | ICD-10-CM

## 2014-05-11 DIAGNOSIS — Z01411 Encounter for gynecological examination (general) (routine) with abnormal findings: Secondary | ICD-10-CM | POA: Diagnosis present

## 2014-05-11 DIAGNOSIS — IMO0002 Reserved for concepts with insufficient information to code with codable children: Secondary | ICD-10-CM

## 2014-05-11 DIAGNOSIS — Z1151 Encounter for screening for human papillomavirus (HPV): Secondary | ICD-10-CM | POA: Diagnosis present

## 2014-05-11 DIAGNOSIS — E119 Type 2 diabetes mellitus without complications: Secondary | ICD-10-CM

## 2014-05-11 DIAGNOSIS — Z1239 Encounter for other screening for malignant neoplasm of breast: Secondary | ICD-10-CM

## 2014-05-11 DIAGNOSIS — E1165 Type 2 diabetes mellitus with hyperglycemia: Secondary | ICD-10-CM

## 2014-05-11 DIAGNOSIS — Z124 Encounter for screening for malignant neoplasm of cervix: Secondary | ICD-10-CM

## 2014-05-11 LAB — COMPREHENSIVE METABOLIC PANEL
ALT: 11 U/L (ref 0–35)
AST: 11 U/L (ref 0–37)
Albumin: 3.6 g/dL (ref 3.5–5.2)
Alkaline Phosphatase: 127 U/L — ABNORMAL HIGH (ref 39–117)
BUN: 9 mg/dL (ref 6–23)
CALCIUM: 9.3 mg/dL (ref 8.4–10.5)
CHLORIDE: 99 meq/L (ref 96–112)
CO2: 26 mEq/L (ref 19–32)
Creat: 0.48 mg/dL — ABNORMAL LOW (ref 0.50–1.10)
Glucose, Bld: 308 mg/dL — ABNORMAL HIGH (ref 70–99)
Potassium: 3.8 mEq/L (ref 3.5–5.3)
Sodium: 136 mEq/L (ref 135–145)
Total Bilirubin: 0.3 mg/dL (ref 0.2–1.2)
Total Protein: 7.1 g/dL (ref 6.0–8.3)

## 2014-05-11 LAB — TSH: TSH: 2.332 u[IU]/mL (ref 0.350–4.500)

## 2014-05-11 LAB — LIPID PANEL
CHOLESTEROL: 232 mg/dL — AB (ref 0–200)
HDL: 52 mg/dL (ref >39)
LDL Cholesterol: 151 mg/dL — ABNORMAL HIGH (ref 0–99)
TRIGLYCERIDES: 146 mg/dL (ref <150)
Total CHOL/HDL Ratio: 4.5 Ratio
VLDL: 29 mg/dL (ref 0–40)

## 2014-05-11 LAB — CBC
HCT: 38.3 % (ref 36.0–46.0)
Hemoglobin: 12.8 g/dL (ref 12.0–15.0)
MCH: 26 pg (ref 26.0–34.0)
MCHC: 33.4 g/dL (ref 30.0–36.0)
MCV: 77.8 fL — ABNORMAL LOW (ref 78.0–100.0)
MPV: 10.6 fL (ref 8.6–12.4)
Platelets: 365 10*3/uL (ref 150–400)
RBC: 4.92 MIL/uL (ref 3.87–5.11)
RDW: 16.5 % — ABNORMAL HIGH (ref 11.5–15.5)
WBC: 8.2 10*3/uL (ref 4.0–10.5)

## 2014-05-11 LAB — POCT GLYCOSYLATED HEMOGLOBIN (HGB A1C): Hemoglobin A1C: 11.4

## 2014-05-11 MED ORDER — CLONIDINE HCL 0.3 MG PO TABS: 0.3000 mg | ORAL_TABLET | Freq: Two times a day (BID) | ORAL | Status: DC

## 2014-05-11 MED ORDER — "INSULIN SYRINGE 29G X 1/2"" 0.3 ML MISC": Status: DC

## 2014-05-11 MED ORDER — AMLODIPINE BESYLATE 10 MG PO TABS: 10.0000 mg | ORAL_TABLET | Freq: Every day | ORAL | Status: DC

## 2014-05-11 MED ORDER — ACCU-CHEK SOFT TOUCH LANCETS MISC
Status: DC
Start: 2014-05-11 — End: 2015-05-19

## 2014-05-11 MED ORDER — METFORMIN HCL 500 MG PO TABS: 500.0000 mg | ORAL_TABLET | Freq: Two times a day (BID) | ORAL | Status: DC

## 2014-05-11 MED ORDER — TRAMADOL HCL 50 MG PO TABS: 50.0000 mg | ORAL_TABLET | Freq: Four times a day (QID) | ORAL | Status: DC | PRN

## 2014-05-11 MED ORDER — INSULIN GLARGINE 100 UNIT/ML ~~LOC~~ SOLN: 20.0000 [IU] | Freq: Every day | SUBCUTANEOUS | Status: DC

## 2014-05-11 MED ORDER — HYDROCHLOROTHIAZIDE 25 MG PO TABS: 25.0000 mg | ORAL_TABLET | Freq: Every day | ORAL | Status: DC

## 2014-05-11 MED ORDER — LOSARTAN POTASSIUM 25 MG PO TABS: 25.0000 mg | ORAL_TABLET | Freq: Every day | ORAL | Status: DC

## 2014-05-11 MED ORDER — OMEPRAZOLE 20 MG PO CPDR
20.0000 mg | DELAYED_RELEASE_CAPSULE | Freq: Two times a day (BID) | ORAL | Status: DC
Start: 2014-05-11 — End: 2015-08-06

## 2014-05-11 MED ORDER — ASPIRIN 325 MG PO TABS: 325.0000 mg | ORAL_TABLET | Freq: Every day | ORAL | Status: DC

## 2014-05-11 MED ORDER — CARVEDILOL 25 MG PO TABS: 25.0000 mg | ORAL_TABLET | Freq: Two times a day (BID) | ORAL | Status: DC

## 2014-05-11 MED ORDER — SIMVASTATIN 10 MG PO TABS: 10.0000 mg | ORAL_TABLET | Freq: Every day | ORAL | Status: DC

## 2014-05-11 NOTE — Assessment & Plan Note (Signed)
Advised to restart insulin at 20 u qhs, see Nutrition.

## 2014-05-11 NOTE — Patient Instructions (Signed)
Diabetes and Foot Care Diabetes may cause you to have problems because of poor blood supply (circulation) to your feet and legs. This may cause the skin on your feet to become thinner, break easier, and heal more slowly. Your skin may become dry, and the skin may peel and crack. You may also have nerve damage in your legs and feet causing decreased feeling in them. You may not notice minor injuries to your feet that could lead to infections or more serious problems. Taking care of your feet is one of the most important things you can do for yourself.  HOME CARE INSTRUCTIONS  Wear shoes at all times, even in the house. Do not go barefoot. Bare feet are easily injured.  Check your feet daily for blisters, cuts, and redness. If you cannot see the bottom of your feet, use a mirror or ask someone for help.  Wash your feet with warm water (do not use hot water) and mild soap. Then pat your feet and the areas between your toes until they are completely dry. Do not soak your feet as this can dry your skin.  Apply a moisturizing lotion or petroleum jelly (that does not contain alcohol and is unscented) to the skin on your feet and to dry, brittle toenails. Do not apply lotion between your toes.  Trim your toenails straight across. Do not dig under them or around the cuticle. File the edges of your nails with an emery board or nail file.  Do not cut corns or calluses or try to remove them with medicine.  Wear clean socks or stockings every day. Make sure they are not too tight. Do not wear knee-high stockings since they may decrease blood flow to your legs.  Wear shoes that fit properly and have enough cushioning. To break in new shoes, wear them for just a few hours a day. This prevents you from injuring your feet. Always look in your shoes before you put them on to be sure there are no objects inside.  Do not cross your legs. This may decrease the blood flow to your feet.  If you find a minor scrape,  cut, or break in the skin on your feet, keep it and the skin around it clean and dry. These areas may be cleansed with mild soap and water. Do not cleanse the area with peroxide, alcohol, or iodine.  When you remove an adhesive bandage, be sure not to damage the skin around it.  If you have a wound, look at it several times a day to make sure it is healing.  Do not use heating pads or hot water bottles. They may burn your skin. If you have lost feeling in your feet or legs, you may not know it is happening until it is too late.  Make sure your health care provider performs a complete foot exam at least annually or more often if you have foot problems. Report any cuts, sores, or bruises to your health care provider immediately. SEEK MEDICAL CARE IF:   You have an injury that is not healing.  You have cuts or breaks in the skin.  You have an ingrown nail.  You notice redness on your legs or feet.  You feel burning or tingling in your legs or feet.  You have pain or cramps in your legs and feet.  Your legs or feet are numb.  Your feet always feel cold. SEEK IMMEDIATE MEDICAL CARE IF:   There is increasing redness,   swelling, or pain in or around a wound.  There is a red line that goes up your leg.  Pus is coming from a wound.  You develop a fever or as directed by your health care provider.  You notice a bad smell coming from an ulcer or wound. Document Released: 04/19/2000 Document Revised: 12/23/2012 Document Reviewed: 09/29/2012 [facility] Patient Information 2015 [place], [place]. This information is not intended to replace advice given to you by your health care provider. Make sure you discuss any questions you have with your health care provider. Basic Carbohydrate Counting for Diabetes Mellitus Carbohydrate counting is a method for keeping track of the amount of carbohydrates you eat. Eating carbohydrates naturally increases the level of sugar (glucose) in your blood, so it is  important for you to know the amount that is okay for you to have in every meal. Carbohydrate counting helps keep the level of glucose in your blood within normal limits. The amount of carbohydrates allowed is different for every person. A dietitian can help you calculate the amount that is right for you. Once you know the amount of carbohydrates you can have, you can count the carbohydrates in the foods you want to eat. Carbohydrates are found in the following foods:  Grains, such as breads and cereals.  Dried beans and soy products.  Starchy vegetables, such as potatoes, peas, and corn.  Fruit and fruit juices.  Milk and yogurt.  Sweets and snack foods, such as cake, cookies, candy, chips, soft drinks, and fruit drinks. CARBOHYDRATE COUNTING There are two ways to count the carbohydrates in your food. You can use either of the methods or a combination of both. Reading the "Nutrition Facts" on [place] The "Nutrition Facts" is an area that is included on the labels of almost all packaged food and beverages in the [place]. It includes the serving size of that food or beverage and information about the nutrients in each serving of the food, including the grams (g) of carbohydrate per serving.  Decide the number of servings of this food or beverage that you will be able to eat or drink. Multiply that number of servings by the number of grams of carbohydrate that is listed on the label for that serving. The total will be the amount of carbohydrates you will be having when you eat or drink this food or beverage. Learning Standard Serving Sizes of Food When you eat food that is not packaged or does not include "Nutrition Facts" on the label, you need to measure the servings in order to count the amount of carbohydrates.A serving of most carbohydrate-rich foods contains about 15 g of carbohydrates. The following list includes serving sizes of carbohydrate-rich foods that provide 15 g  ofcarbohydrate per serving:   1 slice of bread (1 oz) or 1 six-inch tortilla.    of a hamburger bun or English muffin.  4-6 crackers.   cup unsweetened dry cereal.    cup hot cereal.   cup rice or pasta.    cup mashed potatoes or  of a large baked potato.  1 cup fresh fruit or one small piece of fruit.    cup canned or frozen fruit or fruit juice.  1 cup milk.   cup plain fat-free yogurt or yogurt sweetened with artificial sweeteners.   cup cooked dried beans or starchy vegetable, such as peas, corn, or potatoes.  Decide the number of standard-size servings that you will eat. Multiply that number of servings by 15 (  the grams of carbohydrates in that serving). For example, if you eat 2 cups of strawberries, you will have eaten 2 servings and 30 g of carbohydrates (2 servings x 15 g = 30 g). For foods such as soups and casseroles, in which more than one food is mixed in, you will need to count the carbohydrates in each food that is included. EXAMPLE OF CARBOHYDRATE COUNTING Sample Dinner  3 oz chicken breast.   cup of brown rice.   cup of corn.  1 cup milk.   1 cup strawberries with sugar-free whipped topping.  Carbohydrate Calculation Step 1: Identify the foods that contain carbohydrates:   Rice.   Corn.   Milk.   Strawberries. Step 2:Calculate the number of servings eaten of each:   2 servings of rice.   1 serving of corn.   1 serving of milk.   1 serving of strawberries. Step 3: Multiply each of those number of servings by 15 g:   2 servings of rice x 15 g = 30 g.   1 serving of corn x 15 g = 15 g.   1 serving of milk x 15 g = 15 g.   1 serving of strawberries x 15 g = 15 g. Step 4: Add together all of the amounts to find the total grams of carbohydrates eaten: 30 g + 15 g + 15 g + 15 g = 75 g. Document Released: 04/22/2005 Document Revised: 09/06/2013 Document Reviewed: 03/19/2013 [facility] Patient Information 2015  [place], [place]. This information is not intended to replace advice given to you by your health care provider. Make sure you discuss any questions you have with your health care provider.

## 2014-05-11 NOTE — Assessment & Plan Note (Signed)
Continue current meds-refills given.

## 2014-05-11 NOTE — Progress Notes (Signed)
Subjective:    Patient ID: [name] is a 54 y.o. female presenting with Diabetes and Medication Refill  on 05/11/2014  HPI: Has not taken insulin for about 1 year. Now her BS are in the 300's and her HgbA1c is 11.3 today.  Notes some lapses over the holiday.  Weight is stable. Needs lots of health maintenance.  Review of Systems  Respiratory: Negative for chest tightness and shortness of breath.   Cardiovascular: Negative for chest pain.  Gastrointestinal: Negative for abdominal pain.  Endocrine: Positive for polydipsia and polyuria.  Musculoskeletal: Positive for back pain.      Objective:    BP 157/83 mmHg  Pulse 78  Temp(Src) 98.1 F (36.7 C) (Oral)  Ht 5\' 9"  (1.753 m)  Wt 284 lb (128.822 kg)  BMI 41.92 kg/m2 Physical Exam  Constitutional: She is oriented to person, place, and time. She appears well-developed and well-nourished. No distress.  obese  HENT:  Head: Normocephalic and atraumatic.  Eyes: No scleral icterus.  Neck: Neck supple.  Cardiovascular: Normal rate.   Pulmonary/Chest: Effort normal.  Abdominal: Soft. She exhibits no mass. There is no tenderness.  Genitourinary: Vagina normal and uterus normal. No vaginal discharge found.  Musculoskeletal: Normal range of motion.  Neurological: She is alert and oriented to person, place, and time.  Skin: Skin is warm and dry.  Psychiatric: She has a normal mood and affect.        Assessment & Plan:   Problem List Items Addressed This Visit      Unprioritized   Diabetes mellitus without complication   Relevant Medications      metFORMIN (GLUCOPHAGE) tablet      insulin glargine (LANTUS) 100 UNIT/ML injection      losartan (COZAAR) tablet      simvastatin (ZOCOR) tablet      aspirin tablet   Other Relevant Orders      POCT A1C (Completed)   HYPERTENSION, BENIGN SYSTEMIC    Continue current meds-refills given.    Relevant Medications      amLODIpine (NORVASC) tablet      carvedilol (COREG) tablet       losartan (COZAAR) tablet      cloNIDine (CATAPRES) tablet      hydrochlorothiazide tablet      simvastatin (ZOCOR) tablet      aspirin tablet   GERD (gastroesophageal reflux disease)   Relevant Medications      omeprazole (PRILOSEC) capsule   RESOLVED: HTN (hypertension), malignant   Relevant Medications      amLODIpine (NORVASC) tablet      carvedilol (COREG) tablet      losartan (COZAAR) tablet      cloNIDine (CATAPRES) tablet      hydrochlorothiazide tablet      simvastatin (ZOCOR) tablet      aspirin tablet   Poorly controlled type 2 diabetes mellitus - Primary    Advised to restart insulin at 20 u qhs, see Nutrition.    Relevant Medications      metFORMIN (GLUCOPHAGE) tablet      insulin glargine (LANTUS) 100 UNIT/ML injection      losartan (COZAAR) tablet      Insulin Syringe-Needle U-100 (INSULIN SYRINGE .3CC/29GX1/2") 29G X 1/2" 0.3 ML MISC      Lancets (ACCU-CHEK SOFT TOUCH) lancets      simvastatin (ZOCOR) tablet      aspirin tablet   Other Relevant Orders      CBC  Comprehensive metabolic panel      TSH      Lipid panel      Microalbumin/Creatinine Ratio, Urine      Ambulatory referral to diabetic education    Other Visit Diagnoses    Screening for cervical cancer        Relevant Orders       Cytology - PAP    Screening for breast cancer        Relevant Orders       MM DIGITAL SCREENING BILATERAL    Type II diabetes mellitus, uncontrolled        Relevant Medications       metFORMIN (GLUCOPHAGE) tablet       insulin glargine (LANTUS) 100 UNIT/ML injection       losartan (COZAAR) tablet       simvastatin (ZOCOR) tablet       aspirin tablet    Bilateral low back pain without sciatica        Relevant Medications       traMADol (ULTRAM) 50 MG tablet       aspirin tablet

## 2014-05-12 LAB — MICROALBUMIN / CREATININE URINE RATIO
Creatinine, Urine: 74.3 mg/dL
MICROALB UR: 44.5 mg/dL — AB (ref <2.0)
MICROALB/CREAT RATIO: 598.9 mg/g — AB (ref 0.0–30.0)

## 2014-05-12 LAB — CYTOLOGY - PAP

## 2014-05-28 ENCOUNTER — Other Ambulatory Visit: Payer: Self-pay | Admitting: Family Medicine

## 2014-05-30 NOTE — Telephone Encounter (Signed)
rx called in . [name], [name]

## 2014-06-02 ENCOUNTER — Telehealth: Payer: Self-pay | Admitting: Family Medicine

## 2014-06-02 NOTE — Telephone Encounter (Signed)
Says the tramadol isnt working Wants another pain medication

## 2014-06-06 NOTE — Telephone Encounter (Signed)
appt made for 06-09-14. [name],CMA

## 2014-06-08 ENCOUNTER — Ambulatory Visit (HOSPITAL_COMMUNITY)
Admission: RE | Admit: 2014-06-08 | Discharge: 2014-06-08 | Disposition: A | Discharge status: Home / Self Care | Payer: Medicaid Other | Source: Ambulatory Visit | Attending: Family Medicine | Admitting: Family Medicine

## 2014-06-08 DIAGNOSIS — Z1231 Encounter for screening mammogram for malignant neoplasm of breast: Secondary | ICD-10-CM | POA: Insufficient documentation

## 2014-06-08 DIAGNOSIS — Z1239 Encounter for other screening for malignant neoplasm of breast: Secondary | ICD-10-CM

## 2014-06-08 IMAGING — MG MM DIGITAL SCREENING BILAT
5 series · 5 of 5 positions shown · non-contrast
Comparison: Previous exam(s).

ACR Breast Density Category a: The breast tissue is almost entirely
fatty.

CLINICAL DATA: Screening.

EXAM:
DIGITAL SCREENING BILATERAL MAMMOGRAM WITH CAD

[R CC]
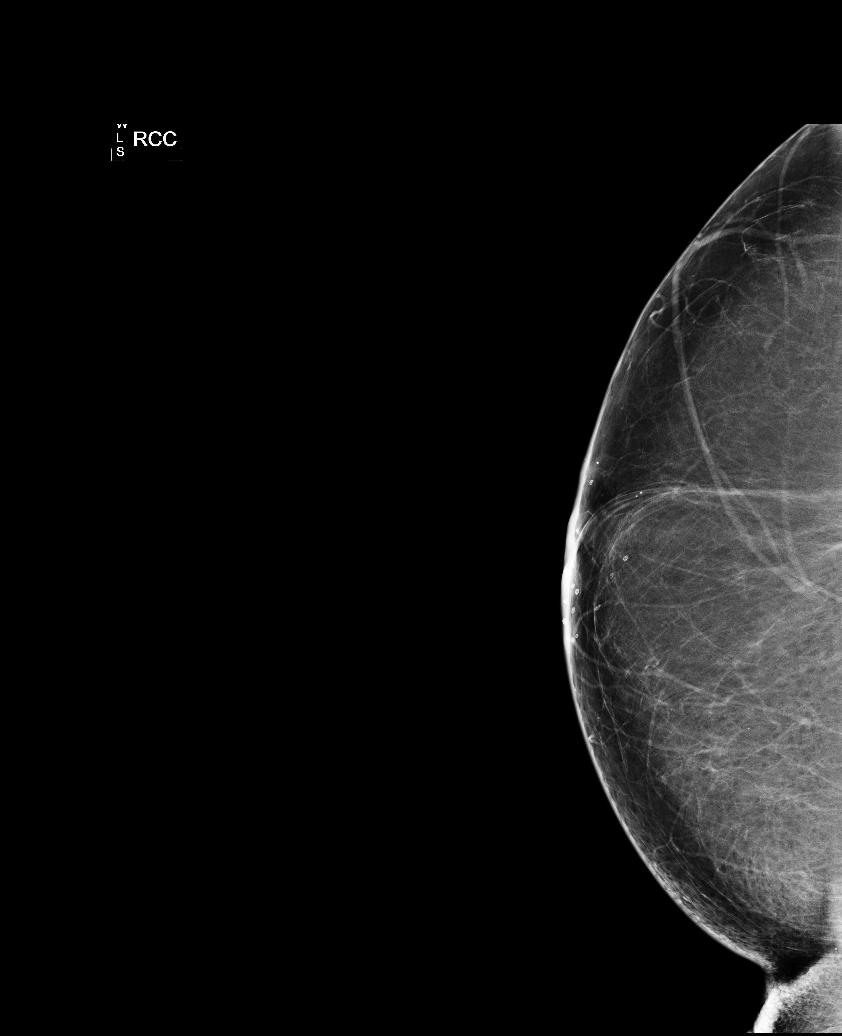

[R MLO]
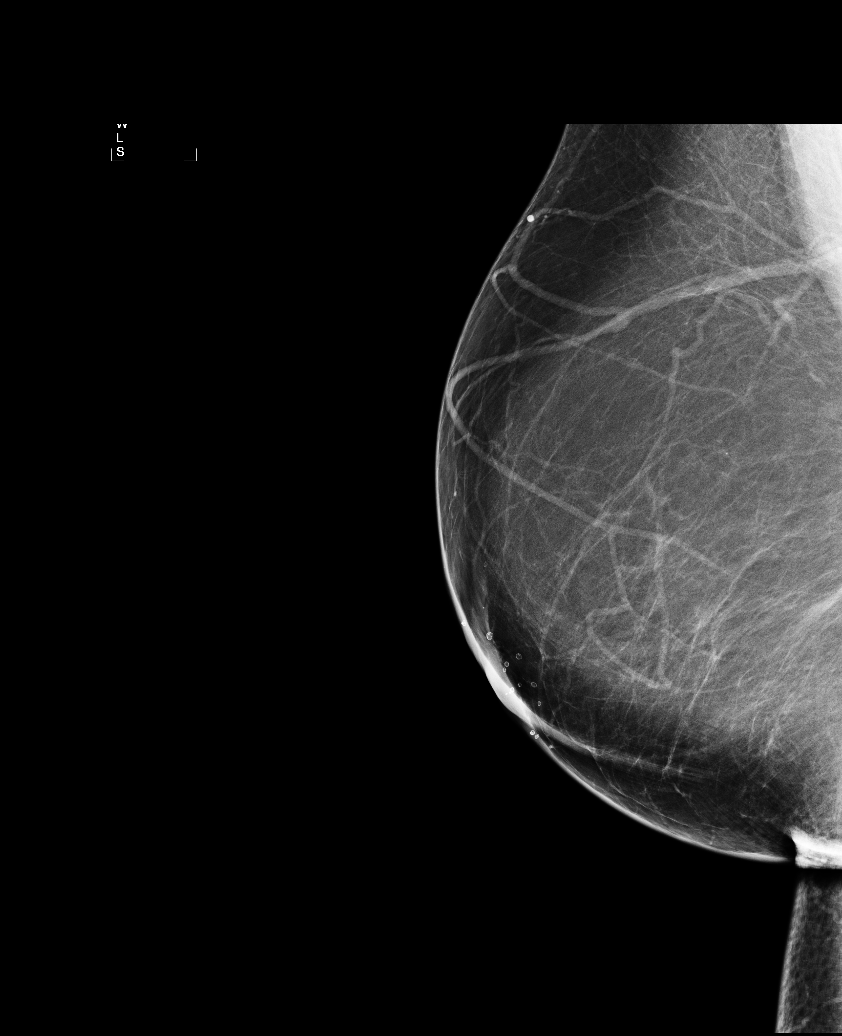

[L CC]
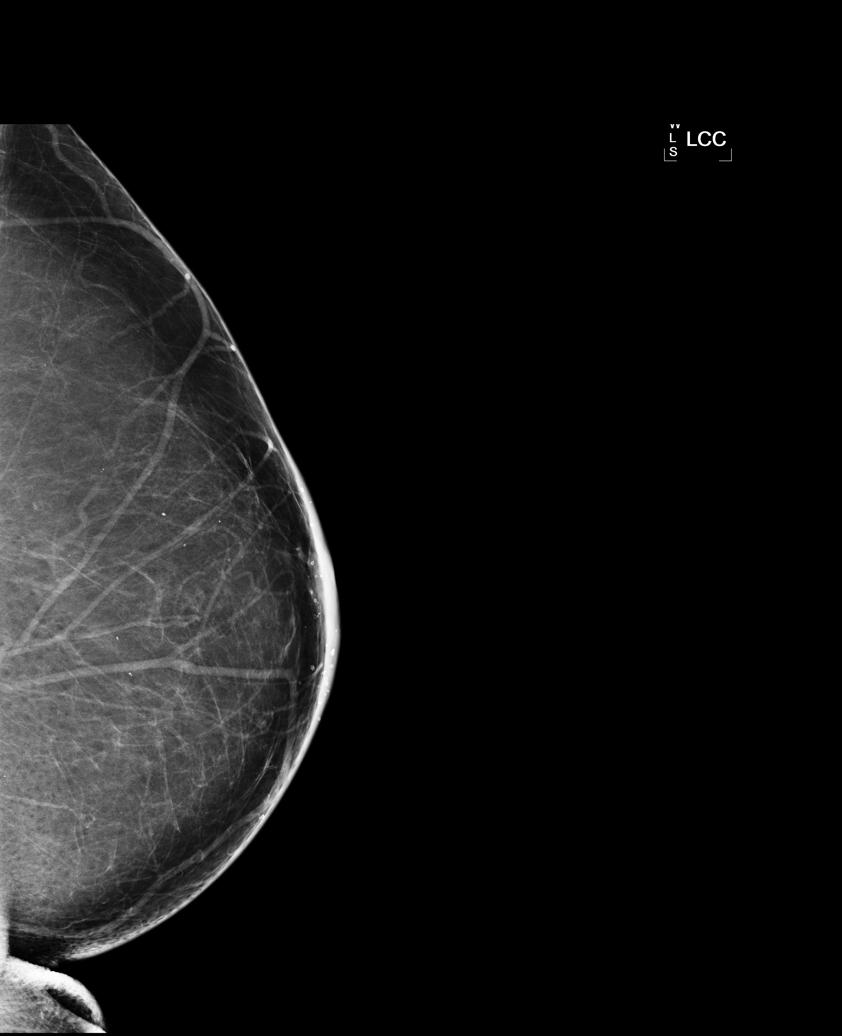

[L MLO (1 of 2)]
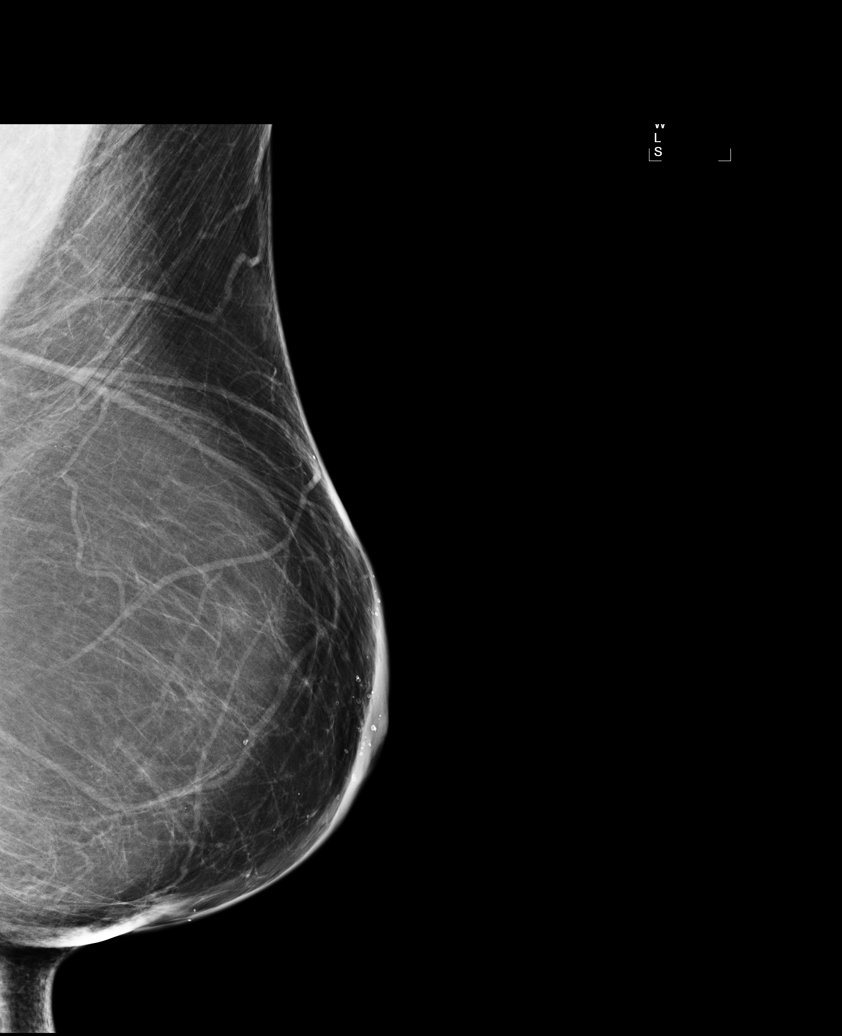

[L MLO (2 of 2)]
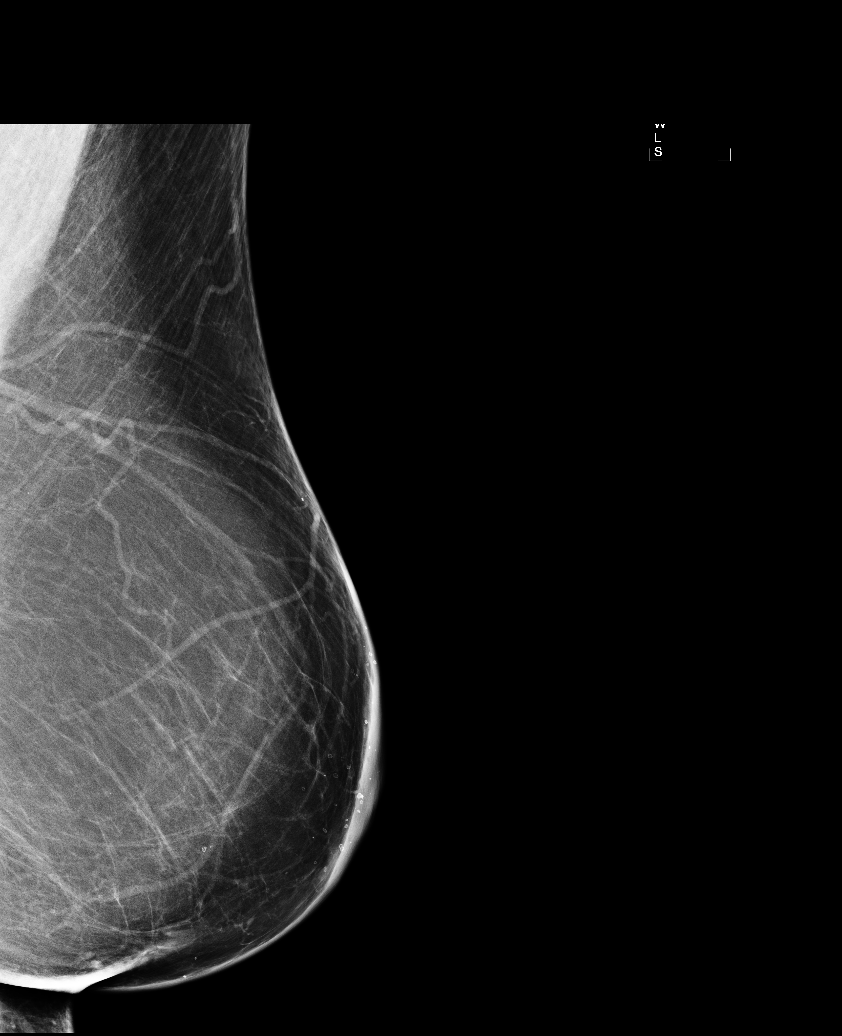

[5 of 5 positions shown; findings below may reference images not displayed]

FINDINGS: There are no findings suspicious for malignancy. Images were
processed with CAD.
IMPRESSION: No mammographic evidence of malignancy. A result letter of this
screening mammogram will be mailed directly to the patient.

RECOMMENDATION:
Screening mammogram in one year. (Code:[0V])

BI-RADS CATEGORY  1: Negative.

## 2014-06-09 ENCOUNTER — Encounter: Payer: Self-pay | Admitting: Family Medicine

## 2014-06-09 ENCOUNTER — Encounter: Payer: Self-pay | Admitting: *Deleted

## 2014-06-09 ENCOUNTER — Ambulatory Visit (INDEPENDENT_AMBULATORY_CARE_PROVIDER_SITE_OTHER): Payer: Medicaid Other | Admitting: Family Medicine

## 2014-06-09 ENCOUNTER — Encounter: Payer: Medicaid Other | Attending: Family Medicine | Admitting: *Deleted

## 2014-06-09 VITALS — Ht 69.0 in | Wt 284.5 lb

## 2014-06-09 VITALS — BP 178/94 | HR 77 | Temp 97.8°F | Ht 69.0 in | Wt 283.0 lb

## 2014-06-09 DIAGNOSIS — Z794 Long term (current) use of insulin: Secondary | ICD-10-CM | POA: Diagnosis not present

## 2014-06-09 DIAGNOSIS — E1165 Type 2 diabetes mellitus with hyperglycemia: Secondary | ICD-10-CM

## 2014-06-09 DIAGNOSIS — E119 Type 2 diabetes mellitus without complications: Secondary | ICD-10-CM | POA: Insufficient documentation

## 2014-06-09 DIAGNOSIS — M545 Low back pain, unspecified: Secondary | ICD-10-CM | POA: Insufficient documentation

## 2014-06-09 DIAGNOSIS — Z713 Dietary counseling and surveillance: Secondary | ICD-10-CM | POA: Diagnosis not present

## 2014-06-09 DIAGNOSIS — I10 Essential (primary) hypertension: Secondary | ICD-10-CM

## 2014-06-09 MED ORDER — LOSARTAN POTASSIUM 50 MG PO TABS: 50.0000 mg | ORAL_TABLET | Freq: Every day | ORAL | Status: DC

## 2014-06-09 MED ORDER — DICLOFENAC SODIUM 75 MG PO TBEC: 75.0000 mg | DELAYED_RELEASE_TABLET | Freq: Two times a day (BID) | ORAL | Status: DC

## 2014-06-09 MED ORDER — CYCLOBENZAPRINE HCL 10 MG PO TABS: 10.0000 mg | ORAL_TABLET | Freq: Three times a day (TID) | ORAL | Status: DC | PRN

## 2014-06-09 NOTE — Progress Notes (Signed)
Diabetes Self-Management Education   Appt. Start Time: 1030 Appt. End Time: 1200  06/09/2014  Ms. Yalini Duplessis, identified by name and date of birth, is a 54 y.o. female with a diagnosis of Diabetes: Type 2.  Other people present during visit:  Patient . Mrs. Cerra lives with her husband, grown son, grown daughter and grand child. She does most of the shopping and food preparation. She has begun testing her glucose BID. However she is not testing fasting. She is testing 30 minutes after breakfast and about 1-2 hours after dinner. I have directed her to test FBS & 2hpp dinner. She recognizes that part of her weight and glucose problem is due to snacking, poor choices and excess.  ASSESSMENT  Height 5\' 9"  (1.753 m), weight 284 lb 8 oz (129.048 kg). Body mass index is 41.99 kg/(m^2).  Initial Visit Information:  Are you currently following a meal plan?: No Are you taking your medications as prescribed?: Yes How often do you need to have someone help you when you read instructions, pamphlets, or other written materials from your doctor or pharmacy?: 1 - Never  Psychosocial:  Patient Belief/Attitude about Diabetes: Motivated to manage diabetes Self-management support: Doctor's office, CDE visits, Family Other persons present: Patient Patient Concerns: Nutrition/Meal planning, Monitoring, Glycemic Control Special Needs: None Preferred Learning Style: No preference indicated Learning Readiness: Change in progress  Complications:   Last HgB A1C per patient/outside source: 11.4 mg/dL How often do you check your blood sugar?: 1-2 times/day (patient has been testing 30-60 minutes after her meals) Fasting Blood glucose range (mg/dL): >200 Postprandial Blood glucose range (mg/dL): 180-200 Have you had a dilated eye exam in the past 12 months?: Yes Have you had a dental exam in the past 12 months?: Yes  Diet Intake:  Breakfast: oatmeal 1C, milk, water Lunch: soup (tomato) and sandwich (ham &  cheese) Dinner: baked chicken, snap green beans, corn, roll Beverage(s): water. coffe(milk, Monia Pouch), diet Pepsi (rare)  Exercise:  Exercise: Light (walking / raking leaves) Light Exercise amount of time (min / week): 150  Individualized Plan for Diabetes Self-Management Training:   Learning Objective:  Patient will have a greater understanding of diabetes self-management. Patient education plan per assessed needs and concerns is to attend individual sessions     Education Topics Reviewed with Patient Today:  Definition of diabetes, type 1 and 2, and the diagnosis of diabetes, Factors that contribute to the development of diabetes Role of diet in the treatment of diabetes and the relationship between the three main macronutrients and blood glucose level, Food label reading, portion sizes and measuring food., Carbohydrate counting, Information on hints to eating out and maintain blood glucose control., Meal options for control of blood glucose level and chronic complications. Role of exercise on diabetes management, blood pressure control and cardiac health. Reviewed patients medication for diabetes, action, purpose, timing of dose and side effects. Purpose and frequency of SMBG., Yearly dilated eye exam, Interpreting lab values - A1C, lipid, urine microalbumina. Relationship between chronic complications and blood glucose control, Dental care  PATIENTS GOALS/Plan (Developed by the patient):  Nutrition: Follow meal plan discussed Physical Activity: Exercise 5-7 days per week Medications: take my medication as prescribed Monitoring : test my blood glucose as discussed (note x per day with comment) (FBS & 2hpp dinner)  Patient Instructions  Plan:  Aim for 2 Carb Choices per meal (30 grams) +/- 1 either way  Aim for 0-15 Carbs per snack if hungry  Include protein in moderation  with your meals and snacks Consider reading food labels for Total Carbohydrate and Fat Grams of foods Consider   increasing your activity level by walking for 30-60 minutes daily as tolerated Consider checking BG at alternate times per day to include fasting( before breakfast) and 2 hours after dinner as directed by MD  Continue taking medication  as directed by MD  Decrease portions Make your plate and don't go back for seconds Measure out your snack [facility]) and put the box back Stay away from the Activia Regular Yogurt ( too much sugar)  Canned fished OK if packed in water Not oil  Expected Outcomes:  Demonstrated interest in learning. Expect positive outcomes  Education material provided: Living Well with Diabetes, A1C conversion sheet, Meal plan card and Snack sheet  If problems or questions, patient to contact team via:  Phone  Future DSME appointment: 4-6 wks

## 2014-06-09 NOTE — Patient Instructions (Addendum)
Plan:  Aim for 2 Carb Choices per meal (30 grams) +/- 1 either way  Aim for 0-15 Carbs per snack if hungry  Include protein in moderation with your meals and snacks Consider reading food labels for Total Carbohydrate and Fat Grams of foods Consider  increasing your activity level by walking for 30-60 minutes daily as tolerated Consider checking BG at alternate times per day to include fasting( before breakfast) and 2 hours after dinner as directed by MD  Continue taking medication  as directed by MD  Decrease portions Make your plate and don't go back for seconds Measure out your snack [facility]) and put the box back Stay away from the Activia Regular Yogurt ( too much sugar)  Canned fished OK if packed in water Not oil

## 2014-06-09 NOTE — Progress Notes (Signed)
    Subjective:    Patient ID: [name] is a 54 y.o. female presenting with Back Pain  on 06/09/2014  HPI: One month h/o low back pain.  It is midline.  Has been there for 1 month. Notes crampy pain.  There all the time.  Does not radiate. Denies leg pain or weakness. Has tried tylenol and ibuprofen and tramadol is not helping.  Notes some constipation.  Review of Systems  Constitutional: Negative for fever and chills.  Respiratory: Negative for shortness of breath.   Cardiovascular: Negative for chest pain.  Gastrointestinal: Negative for nausea, vomiting and abdominal pain.  Genitourinary: Negative for dysuria.  Skin: Negative for rash.      Objective:    BP 178/94 mmHg  Pulse 77  Temp(Src) 97.8 F (36.6 C) (Oral)  Ht 5\' 9"  (1.753 m)  Wt 283 lb (128.368 kg)  BMI 41.77 kg/m2 Physical Exam  Constitutional: She is oriented to person, place, and time. She appears well-developed and well-nourished. No distress.  HENT:  Head: Normocephalic and atraumatic.  Eyes: No scleral icterus.  Neck: Neck supple.  Cardiovascular: Normal rate.   Pulmonary/Chest: Effort normal.  Abdominal: Soft.  Musculoskeletal: She exhibits no tenderness (Neg SLR bilaterally).  Neurological: She is alert and oriented to person, place, and time. She displays normal reflexes.  Skin: Skin is warm and dry.  Psychiatric: She has a normal mood and affect.        Assessment & Plan:   Problem List Items Addressed This Visit      Unprioritized   Diabetes mellitus without complication    To see eye doctor, saw nutritionist today--will work on diet.      Relevant Medications   losartan (COZAAR) tablet   HYPERTENSION, BENIGN SYSTEMIC    Poorly controlled--will increase Cozaar.      Relevant Medications   losartan (COZAAR) tablet   Poorly controlled type 2 diabetes mellitus - Primary   Relevant Medications   losartan (COZAAR) tablet   Low back pain   Relevant Medications   diclofenac  (VOLTAREN) EC tablet   cyclobenzaprine (FLEXERIL) tablet      Return in about 4 weeks (around 07/07/2014).

## 2014-06-09 NOTE — Assessment & Plan Note (Signed)
Poorly controlled--will increase Cozaar.

## 2014-06-09 NOTE — Assessment & Plan Note (Signed)
To see eye doctor, saw nutritionist today--will work on diet.

## 2014-06-09 NOTE — Patient Instructions (Addendum)

## 2014-06-13 ENCOUNTER — Encounter: Payer: Self-pay | Admitting: *Deleted

## 2014-06-13 NOTE — Progress Notes (Signed)
Prior Authorization received from [place] for Losartan. Formulary and PA form placed in provider box for completion. Pt must have tried and failed an ACE Inhibitor, uneven when using a preferred product.  Derl Barrow, RN

## 2014-06-15 NOTE — Progress Notes (Signed)
PA for Losartan 50 mg pending per [place] [employer].  Confirmation number [id number] Teresita Madura, Rosine Beat, RN  Received PA approval for Losartan 50 mg via [place] [employer].  Med approved for 06/15/2014 - 06/15/2015.  [facility] pharmacy informed.  PA confirmation number [id number] W. PA form scanned in pt's chart for future reference.  Derl Barrow, RN

## 2014-07-14 ENCOUNTER — Other Ambulatory Visit: Payer: Self-pay | Admitting: *Deleted

## 2014-07-14 DIAGNOSIS — M545 Low back pain: Secondary | ICD-10-CM

## 2014-07-14 MED ORDER — CYCLOBENZAPRINE HCL 10 MG PO TABS: 10.0000 mg | ORAL_TABLET | Freq: Three times a day (TID) | ORAL | Status: DC | PRN

## 2014-07-21 ENCOUNTER — Ambulatory Visit: Payer: Medicaid Other | Admitting: *Deleted

## 2014-08-02 ENCOUNTER — Other Ambulatory Visit: Payer: Self-pay | Admitting: *Deleted

## 2014-08-02 MED ORDER — PROMETHAZINE HCL 25 MG PO TABS: ORAL_TABLET | ORAL | Status: DC

## 2014-08-11 ENCOUNTER — Other Ambulatory Visit: Payer: Self-pay | Admitting: *Deleted

## 2014-08-11 MED ORDER — PROMETHAZINE HCL 25 MG PO TABS: ORAL_TABLET | ORAL | Status: DC

## 2014-10-05 ENCOUNTER — Other Ambulatory Visit: Payer: Self-pay | Admitting: *Deleted

## 2014-10-05 DIAGNOSIS — M545 Low back pain: Secondary | ICD-10-CM

## 2014-10-05 MED ORDER — TRAMADOL HCL 50 MG PO TABS: 50.0000 mg | ORAL_TABLET | Freq: Four times a day (QID) | ORAL | Status: DC | PRN

## 2014-10-05 MED ORDER — CYCLOBENZAPRINE HCL 10 MG PO TABS: 10.0000 mg | ORAL_TABLET | Freq: Three times a day (TID) | ORAL | Status: DC | PRN

## 2014-10-05 NOTE — Telephone Encounter (Signed)
Tramadol refill left on pharmacy VM. [name],CMA

## 2014-12-27 ENCOUNTER — Other Ambulatory Visit: Payer: Self-pay | Admitting: Family Medicine

## 2015-01-31 ENCOUNTER — Other Ambulatory Visit: Payer: Self-pay | Admitting: Family Medicine

## 2015-02-01 ENCOUNTER — Other Ambulatory Visit: Payer: Self-pay | Admitting: *Deleted

## 2015-02-01 MED ORDER — TRAMADOL HCL 50 MG PO TABS: 50.0000 mg | ORAL_TABLET | Freq: Four times a day (QID) | ORAL | Status: DC | PRN

## 2015-02-01 NOTE — Telephone Encounter (Signed)
LM on pharmacy VM with rx directions and to call office back for any questions. Mckayla Mulcahey, CMA.

## 2015-03-02 ENCOUNTER — Other Ambulatory Visit: Payer: Self-pay | Admitting: Family Medicine

## 2015-03-03 ENCOUNTER — Other Ambulatory Visit: Payer: Self-pay | Admitting: *Deleted

## 2015-03-05 MED ORDER — TRAMADOL HCL 50 MG PO TABS: 50.0000 mg | ORAL_TABLET | Freq: Four times a day (QID) | ORAL | Status: DC | PRN

## 2015-03-06 NOTE — Telephone Encounter (Signed)
LM with pharmacy with refill information on it. Journi Moffa,CMA

## 2015-03-16 ENCOUNTER — Other Ambulatory Visit: Payer: Self-pay | Admitting: Family Medicine

## 2015-03-27 ENCOUNTER — Other Ambulatory Visit: Payer: Self-pay | Admitting: Family Medicine

## 2015-04-18 ENCOUNTER — Other Ambulatory Visit: Payer: Self-pay | Admitting: Family Medicine

## 2015-05-02 ENCOUNTER — Other Ambulatory Visit: Payer: Self-pay | Admitting: Family Medicine

## 2015-05-03 ENCOUNTER — Other Ambulatory Visit: Payer: Self-pay | Admitting: Family Medicine

## 2015-05-10 ENCOUNTER — Other Ambulatory Visit: Payer: Self-pay | Admitting: Family Medicine

## 2015-05-19 ENCOUNTER — Other Ambulatory Visit: Payer: Self-pay | Admitting: Family Medicine

## 2015-05-28 ENCOUNTER — Other Ambulatory Visit: Payer: Self-pay | Admitting: Family Medicine

## 2015-06-18 ENCOUNTER — Other Ambulatory Visit: Payer: Self-pay | Admitting: Family Medicine

## 2015-06-19 ENCOUNTER — Other Ambulatory Visit: Payer: Self-pay | Admitting: *Deleted

## 2015-06-19 MED ORDER — PROMETHAZINE HCL 25 MG PO TABS: ORAL_TABLET | ORAL | Status: DC

## 2015-06-19 MED ORDER — TRAMADOL HCL 50 MG PO TABS: 50.0000 mg | ORAL_TABLET | Freq: Four times a day (QID) | ORAL | Status: DC | PRN

## 2015-06-26 ENCOUNTER — Other Ambulatory Visit: Payer: Self-pay | Admitting: *Deleted

## 2015-06-26 MED ORDER — "INSULIN SYRINGE-NEEDLE U-100 28G X 1/2"" 0.5 ML MISC": 1.0000 [IU] | Status: DC

## 2015-06-26 NOTE — Telephone Encounter (Signed)
Medication is being called in and message sent to MD to approve insulin needle and syringes. Chrisopher Pustejovsky,CMA

## 2015-06-26 NOTE — Telephone Encounter (Signed)
Pt states that the pharmacy gave her pen needles instead of a needle and syringe (she uses vial insulin) and also that her Tramadol was not called in on 2/13 as out chart says.  Will forward to team. Clinton Sawyer, Salome Spotted

## 2015-07-09 ENCOUNTER — Other Ambulatory Visit: Payer: Self-pay | Admitting: Family Medicine

## 2015-07-12 ENCOUNTER — Other Ambulatory Visit: Payer: Self-pay | Admitting: Family Medicine

## 2015-07-18 ENCOUNTER — Other Ambulatory Visit: Payer: Self-pay | Admitting: Family Medicine

## 2015-07-23 ENCOUNTER — Other Ambulatory Visit: Payer: Self-pay | Admitting: Family Medicine

## 2015-08-05 ENCOUNTER — Encounter (HOSPITAL_COMMUNITY): Payer: Self-pay

## 2015-08-05 ENCOUNTER — Emergency Department (HOSPITAL_COMMUNITY): Payer: Medicaid Other

## 2015-08-05 ENCOUNTER — Emergency Department (HOSPITAL_COMMUNITY)
Admission: EM | Admit: 2015-08-05 | Discharge: 2015-08-05 | Disposition: A | Discharge status: Home / Self Care | Payer: Medicaid Other | Attending: Emergency Medicine | Admitting: Emergency Medicine

## 2015-08-05 DIAGNOSIS — Z7982 Long term (current) use of aspirin: Secondary | ICD-10-CM | POA: Insufficient documentation

## 2015-08-05 DIAGNOSIS — R112 Nausea with vomiting, unspecified: Secondary | ICD-10-CM | POA: Diagnosis not present

## 2015-08-05 DIAGNOSIS — I10 Essential (primary) hypertension: Secondary | ICD-10-CM | POA: Diagnosis not present

## 2015-08-05 DIAGNOSIS — Z79899 Other long term (current) drug therapy: Secondary | ICD-10-CM | POA: Insufficient documentation

## 2015-08-05 DIAGNOSIS — E119 Type 2 diabetes mellitus without complications: Secondary | ICD-10-CM | POA: Insufficient documentation

## 2015-08-05 DIAGNOSIS — Z8719 Personal history of other diseases of the digestive system: Secondary | ICD-10-CM | POA: Insufficient documentation

## 2015-08-05 DIAGNOSIS — Z7984 Long term (current) use of oral hypoglycemic drugs: Secondary | ICD-10-CM | POA: Insufficient documentation

## 2015-08-05 DIAGNOSIS — Z87891 Personal history of nicotine dependence: Secondary | ICD-10-CM | POA: Insufficient documentation

## 2015-08-05 DIAGNOSIS — Z794 Long term (current) use of insulin: Secondary | ICD-10-CM | POA: Insufficient documentation

## 2015-08-05 LAB — URINALYSIS, ROUTINE W REFLEX MICROSCOPIC
Bilirubin Urine: NEGATIVE
Ketones, ur: 15 mg/dL — AB
LEUKOCYTES UA: NEGATIVE
Nitrite: NEGATIVE
PH: 7.5 (ref 5.0–8.0)
Protein, ur: >300 mg/dL — AB
Specific Gravity, Urine: 1.02 (ref 1.005–1.030)

## 2015-08-05 LAB — URINE MICROSCOPIC-ADD ON

## 2015-08-05 LAB — COMPREHENSIVE METABOLIC PANEL
ALK PHOS: 132 U/L — AB (ref 38–126)
ALT: 15 U/L (ref 14–54)
AST: 21 U/L (ref 15–41)
Albumin: 4 g/dL (ref 3.5–5.0)
Anion gap: 13 (ref 5–15)
BUN: 8 mg/dL (ref 6–20)
CALCIUM: 9.7 mg/dL (ref 8.9–10.3)
CHLORIDE: 101 mmol/L (ref 101–111)
CO2: 24 mmol/L (ref 22–32)
CREATININE: 0.55 mg/dL (ref 0.44–1.00)
Glucose, Bld: 292 mg/dL — ABNORMAL HIGH (ref 65–99)
Potassium: 3.9 mmol/L (ref 3.5–5.1)
SODIUM: 138 mmol/L (ref 135–145)
Total Bilirubin: 0.4 mg/dL (ref 0.3–1.2)
Total Protein: 9.2 g/dL — ABNORMAL HIGH (ref 6.5–8.1)

## 2015-08-05 LAB — CBC WITH DIFFERENTIAL/PLATELET
BASOS ABS: 0 10*3/uL (ref 0.0–0.1)
Basophils Relative: 0 %
EOS PCT: 1 %
Eosinophils Absolute: 0.1 10*3/uL (ref 0.0–0.7)
HCT: 44.3 % (ref 36.0–46.0)
HEMOGLOBIN: 14.3 g/dL (ref 12.0–15.0)
LYMPHS ABS: 2 10*3/uL (ref 0.7–4.0)
LYMPHS PCT: 14 %
MCH: 25.4 pg — AB (ref 26.0–34.0)
MCHC: 32.3 g/dL (ref 30.0–36.0)
MCV: 78.8 fL (ref 78.0–100.0)
Monocytes Absolute: 0.3 10*3/uL (ref 0.1–1.0)
Monocytes Relative: 2 %
NEUTROS PCT: 83 %
Neutro Abs: 11.7 10*3/uL — ABNORMAL HIGH (ref 1.7–7.7)
PLATELETS: 404 10*3/uL — AB (ref 150–400)
RBC: 5.62 MIL/uL — AB (ref 3.87–5.11)
RDW: 14.8 % (ref 11.5–15.5)
WBC: 14.1 10*3/uL — AB (ref 4.0–10.5)

## 2015-08-05 LAB — CBG MONITORING, ED: GLUCOSE-CAPILLARY: 288 mg/dL — AB (ref 65–99)

## 2015-08-05 LAB — LIPASE, BLOOD: Lipase: 32 U/L (ref 11–51)

## 2015-08-05 LAB — TROPONIN I: Troponin I: <0.03 ng/mL (ref <0.031)

## 2015-08-05 IMAGING — CT CT RENAL STONE PROTOCOL
2 of 3 series · 17 of 46 positions shown, 19 images · non-contrast
Comparison: CT scan of [DATE].

CLINICAL DATA: Nausea.

EXAM:
CT ABDOMEN AND PELVIS WITHOUT CONTRAST
TECHNIQUE: Multidetector CT imaging of the abdomen and pelvis was performed
following the standard protocol without IV contrast.

[Series 4: lung · axial · 0.94mm/px · z∈[-104,-8]mm · 14 of 38 slices shown, 16 images]
[im 3/38  soft-tissue]
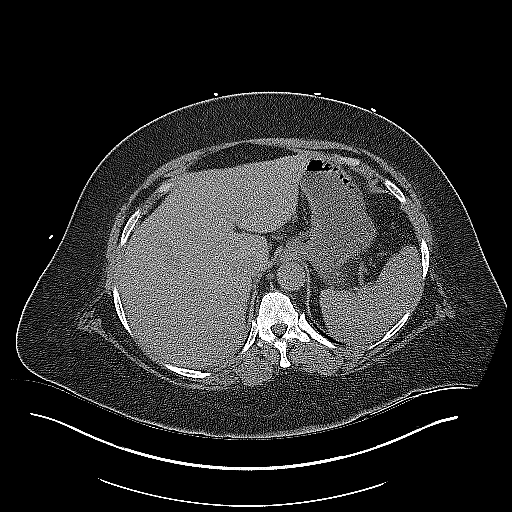
[im 3/38  bone]
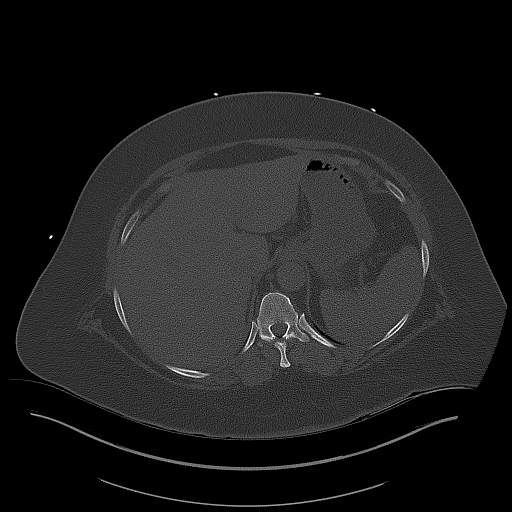
[im 5/38  soft-tissue]
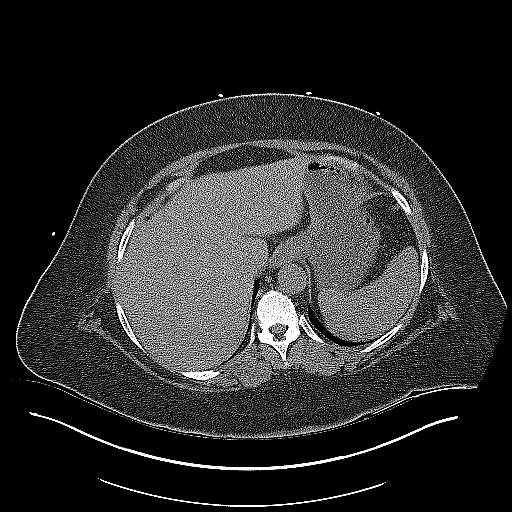
[im 8/38  soft-tissue]
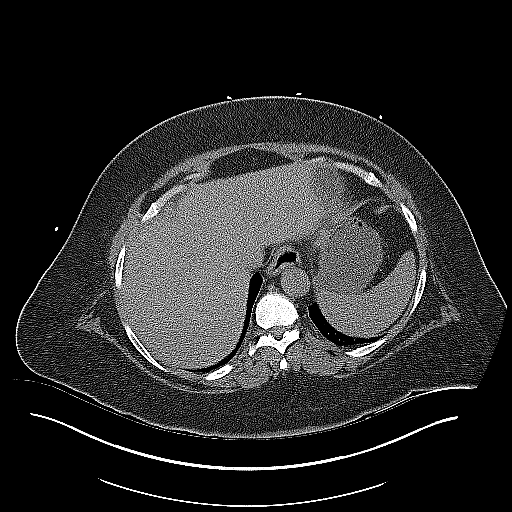
[im 10/38  soft-tissue]
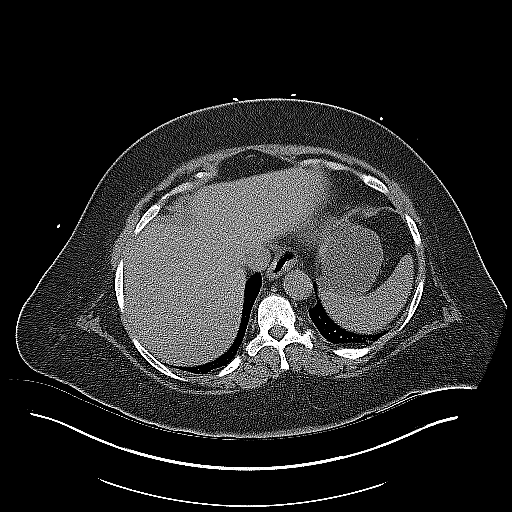
[im 12/38  soft-tissue]
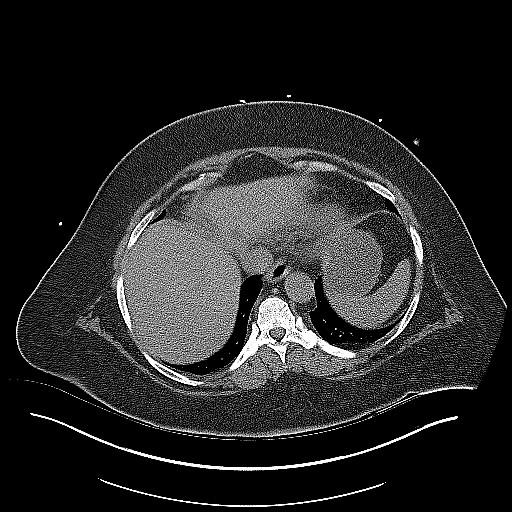
[im 15/38  soft-tissue]
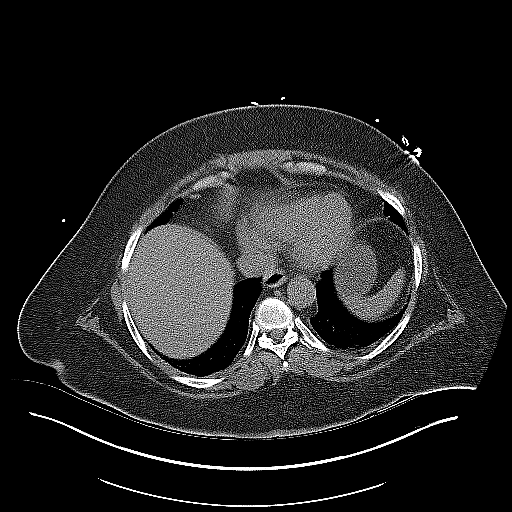
[im 17/38  soft-tissue]
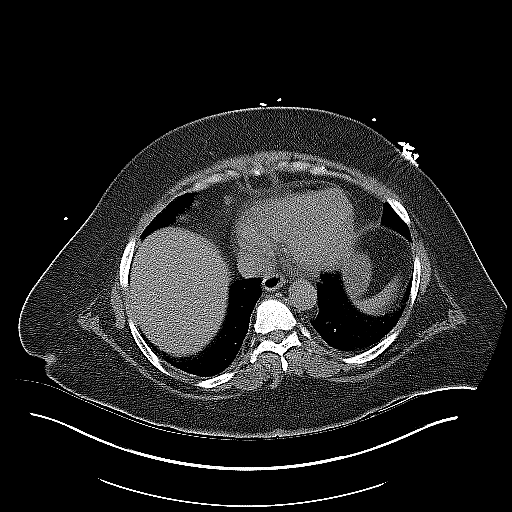
[im 21/38  soft-tissue]
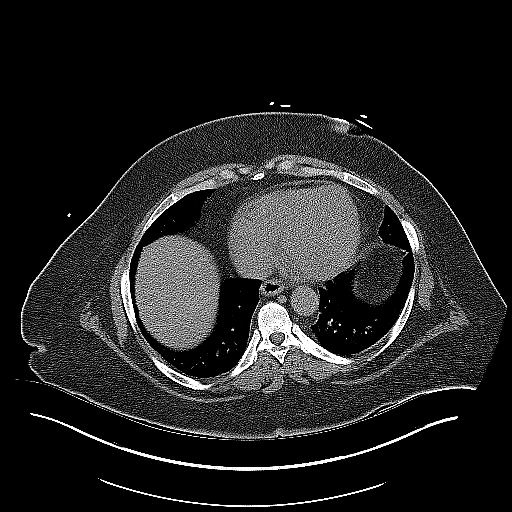
[im 23/38  soft-tissue]
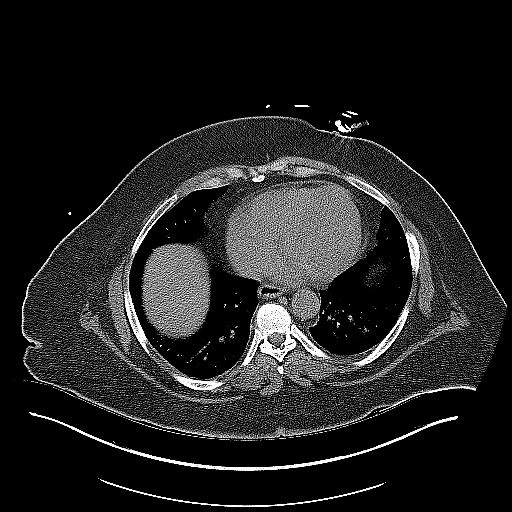
[im 23/38  bone]
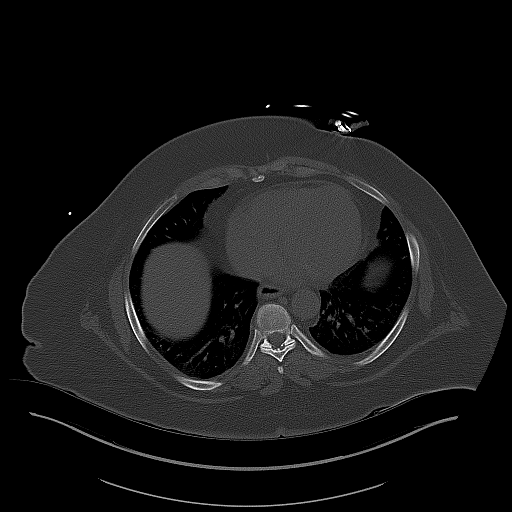
[im 26/38  soft-tissue]
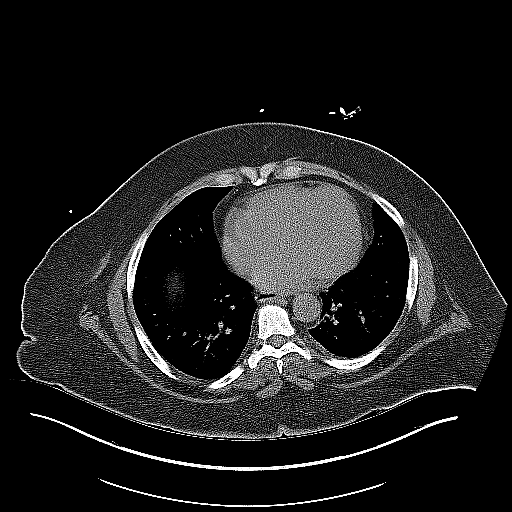
[im 28/38  soft-tissue]
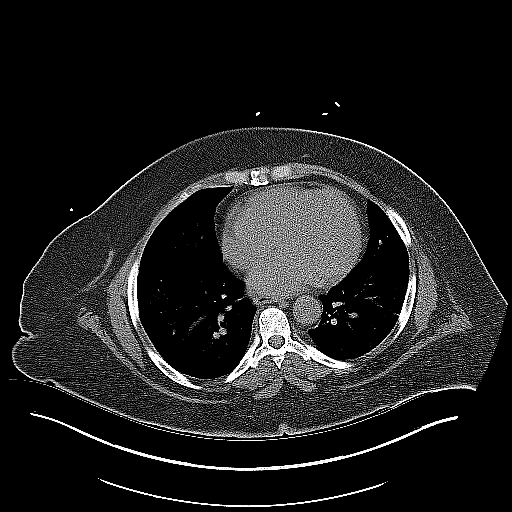
[im 30/38  soft-tissue]
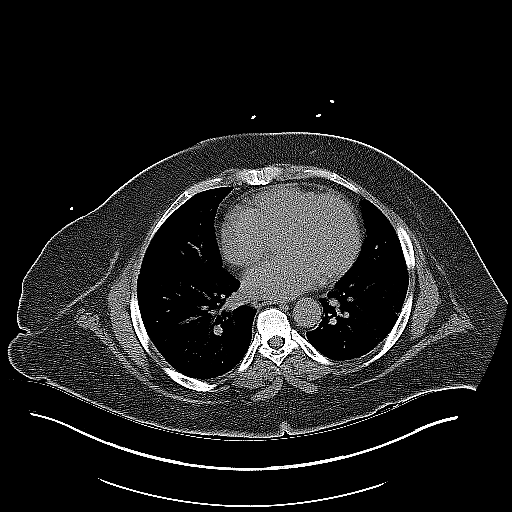
[im 33/38  soft-tissue]
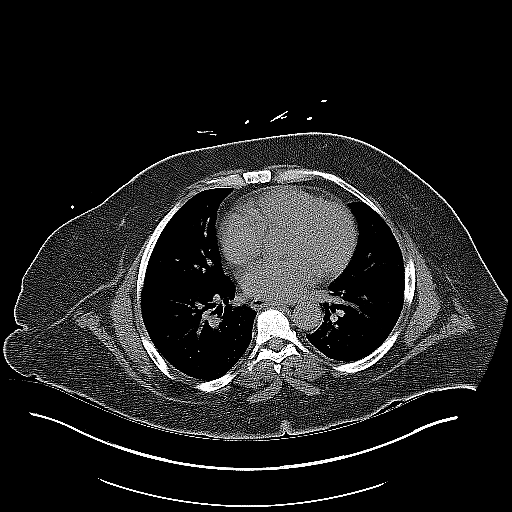
[im 35/38  soft-tissue]
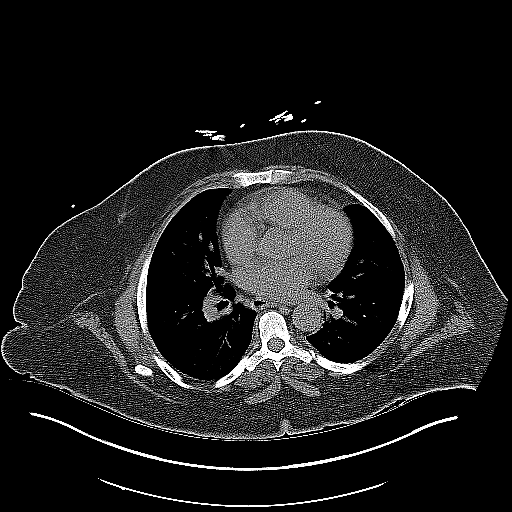

[Series 5: coronal · coronal · 0.92mm/px · 3 of 162 slices shown]
[im 54/162  soft-tissue]
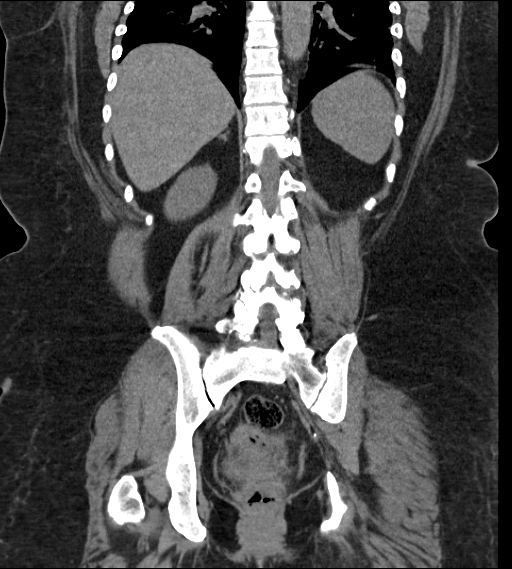
[im 72/162  soft-tissue]
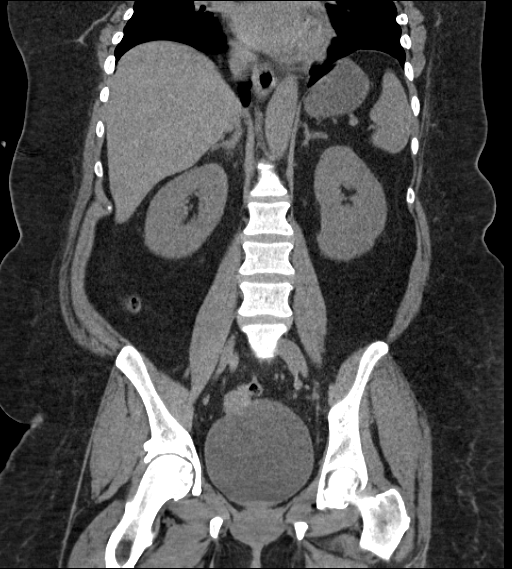
[im 90/162  soft-tissue]
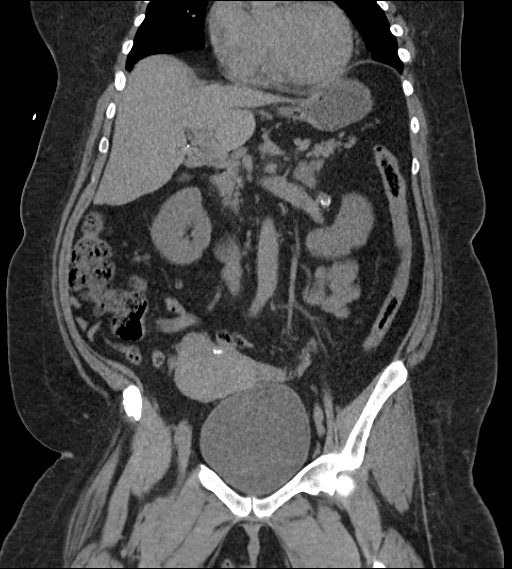

[17 of 46 positions shown; findings below may reference images not displayed]

FINDINGS: Visualized lung bases are unremarkable. No significant osseous
abnormality is noted.

Status post cholecystectomy. No focal abnormality is noted in the
liver, spleen or pancreas on these unenhanced images. Adrenal glands
appear normal. Small nonobstructive calculus is noted in lower pole
collecting system of right kidney. No hydronephrosis or renal
obstruction is noted. No ureteral calculi are noted. Possible 12 mm
calcified left renal artery aneurysm is noted. The appendix appears
normal. There is no evidence of bowel obstruction. No abnormal fluid
collection is noted. Urinary bladder is mildly distended. Fibroid
uterus is noted. Mild fat containing periumbilical hernia is noted.
No significant adenopathy is noted. There is no evidence of
abdominal aortic aneurysm.
IMPRESSION: Small nonobstructive left renal calculus. No hydronephrosis or renal
obstruction is noted.

Probable 12 mm calcified left renal artery aneurysm is noted. CT
angiography on nonemergent basis is recommended for further
evaluation.

Probable fibroid uterus is noted.

Mild fat containing periumbilical hernia.

## 2015-08-05 IMAGING — CR DG ABDOMEN ACUTE W/ 1V CHEST
4 series · 4 of 4 positions shown · non-contrast
Comparison: CTA of the chest, and CT of the abdomen and pelvis,
performed [DATE]

CLINICAL DATA: Acute onset of nausea.  Initial encounter.

EXAM:
DG ABDOMEN ACUTE W/ 1V CHEST

[w abdomen decub]
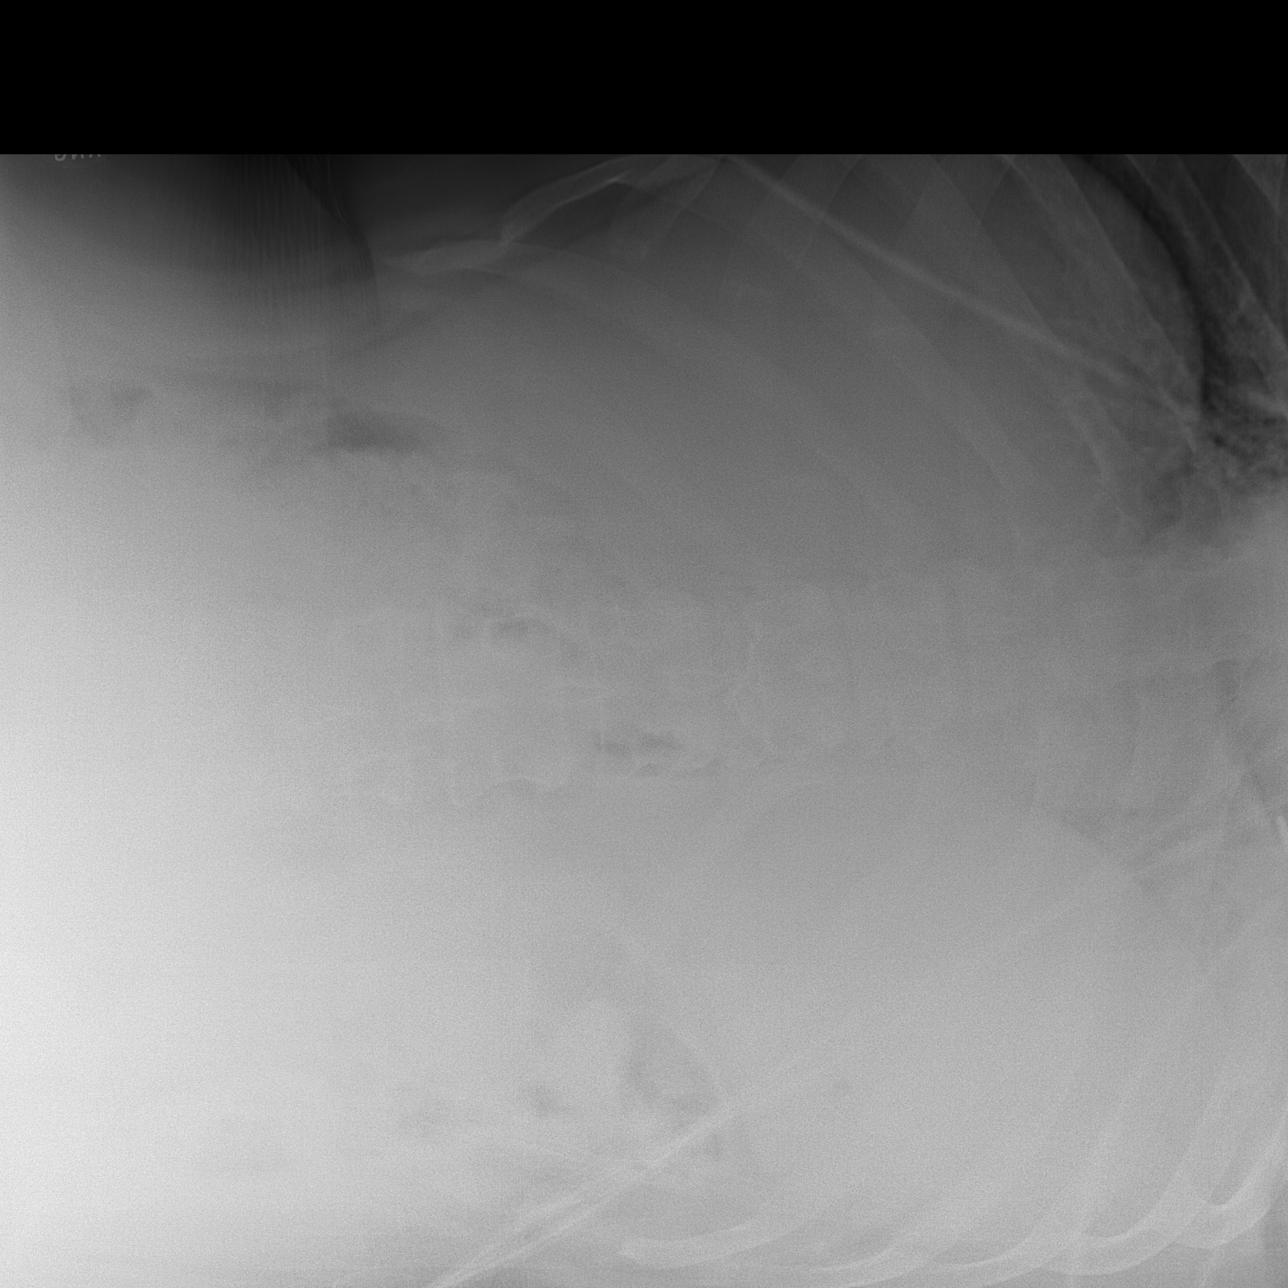

[t abdomen supine (1 of 2)]
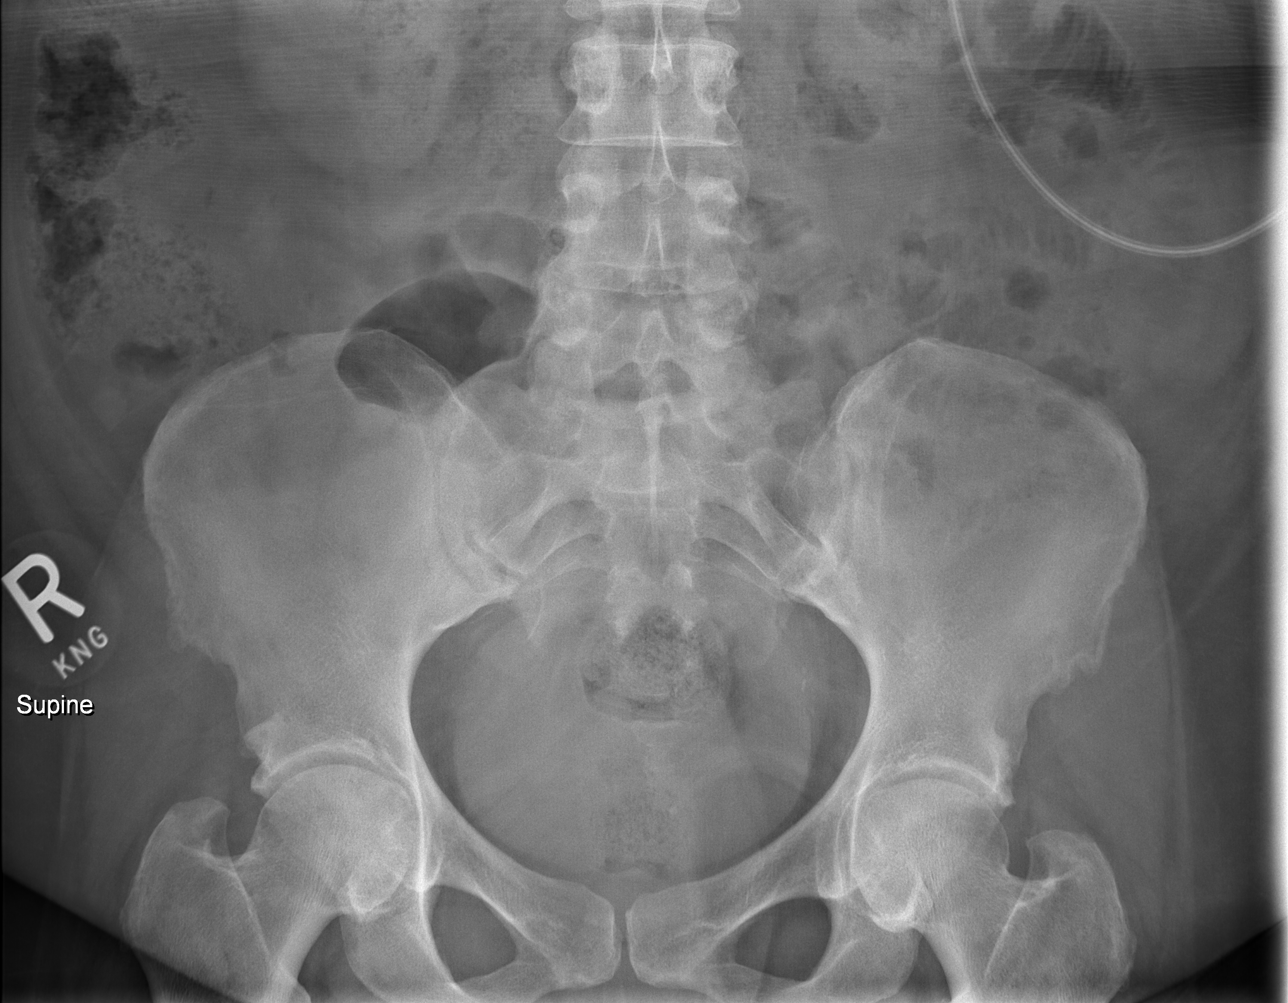

[t abdomen supine (2 of 2)]
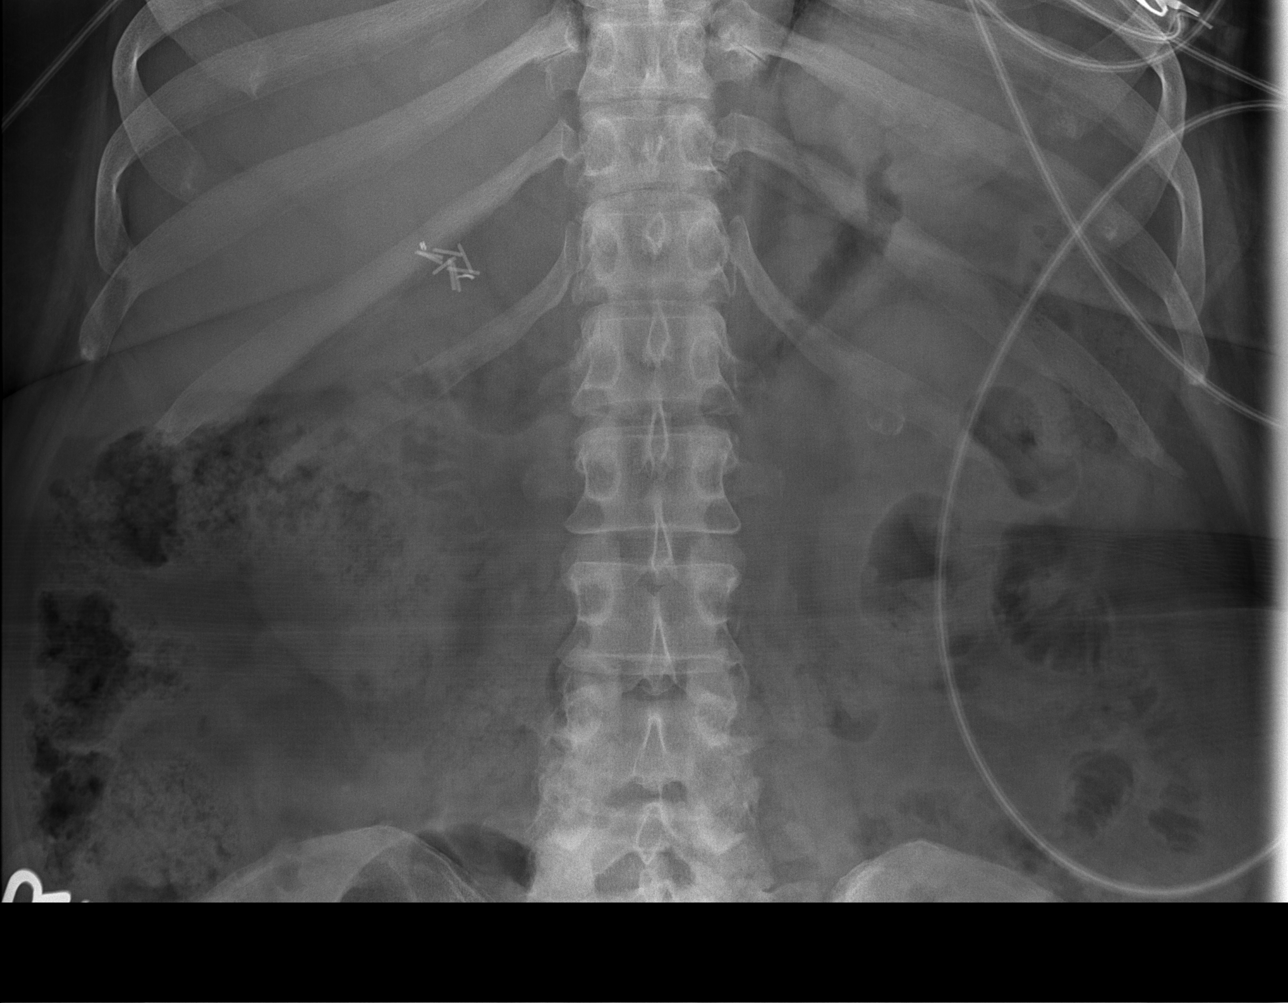

[t chest supine]
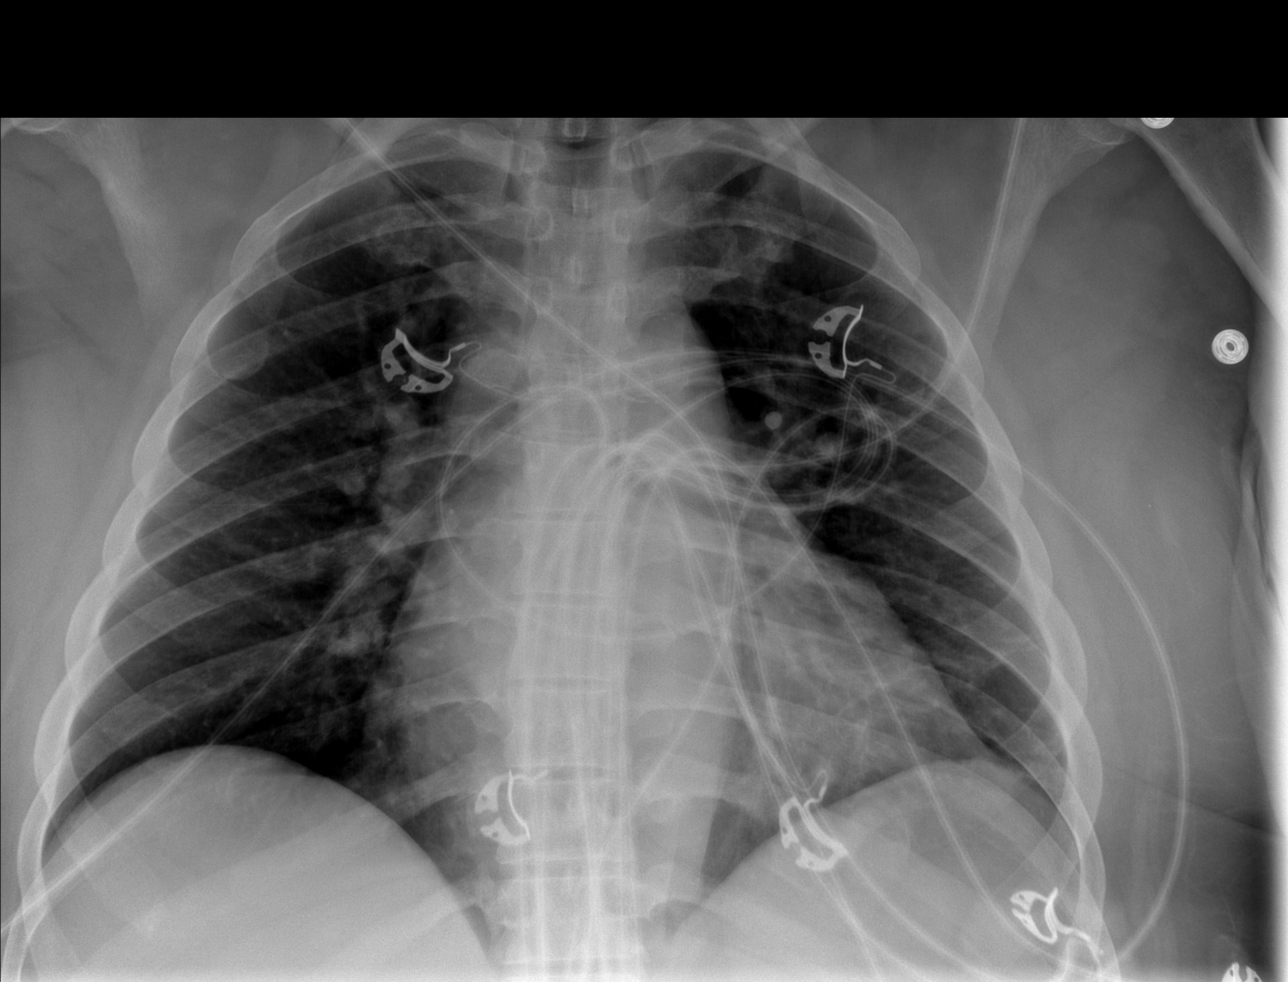

[4 of 4 positions shown; findings below may reference images not displayed]

FINDINGS: The lungs are well-aerated. Minimal left basilar atelectasis is
noted. There is no evidence of pleural effusion or pneumothorax. The
cardiomediastinal silhouette is borderline normal in size.

The visualized bowel gas pattern is unremarkable. Scattered stool
and air are seen within the colon; there is no evidence of small
bowel dilatation to suggest obstruction. No free intra-abdominal air
is identified on the provided decubitus view. Clips are noted within
the right upper quadrant, reflecting prior cholecystectomy.

No acute osseous abnormalities are seen; the sacroiliac joints are
unremarkable in appearance.
IMPRESSION: 1. Minimal left basilar atelectasis noted.  Lungs otherwise clear.
2. Unremarkable bowel gas pattern; no free intra-abdominal air seen.
Moderate amount of stool noted in the colon.

## 2015-08-05 MED ORDER — SODIUM CHLORIDE 0.9 % IV BOLUS (SEPSIS)
1000.0000 mL | Freq: Once | INTRAVENOUS | Status: AC
  Administered 2015-08-05: 1000 mL via INTRAVENOUS

## 2015-08-05 MED ORDER — DICYCLOMINE HCL 10 MG/ML IM SOLN
20.0000 mg | Freq: Once | INTRAMUSCULAR | Status: AC
  Administered 2015-08-05: 20 mg via INTRAMUSCULAR
  Filled 2015-08-05: qty 2

## 2015-08-05 MED ORDER — LORAZEPAM 2 MG/ML IJ SOLN
1.0000 mg | Freq: Once | INTRAMUSCULAR | Status: AC
  Administered 2015-08-05: 1 mg via INTRAVENOUS
  Filled 2015-08-05: qty 1

## 2015-08-05 MED ORDER — ONDANSETRON HCL 4 MG/2ML IJ SOLN
4.0000 mg | Freq: Once | INTRAMUSCULAR | Status: AC
  Administered 2015-08-05: 4 mg via INTRAVENOUS
  Filled 2015-08-05: qty 2

## 2015-08-05 MED ORDER — METOCLOPRAMIDE HCL 10 MG PO TABS: 10.0000 mg | ORAL_TABLET | Freq: Four times a day (QID) | ORAL | Status: DC

## 2015-08-05 MED ORDER — METOCLOPRAMIDE HCL 5 MG/ML IJ SOLN
10.0000 mg | Freq: Once | INTRAMUSCULAR | Status: AC
  Administered 2015-08-05: 10 mg via INTRAVENOUS
  Filled 2015-08-05: qty 2

## 2015-08-05 NOTE — ED Provider Notes (Signed)
Patient care signed out by Antonietta Breach, PA-C. Please see her note for further detail. Briefly, patient with history of diabetes presents for evaluation of emesis. Presentation appears to be consistent with a gastroparesis. Pending plain films of the abdomen to evaluate for possible obstruction. Given nausea medicines. Plan is to follow-up on imaging and reassess after medications.  Patient still with some nausea and discomfort and remains slightly tachycardic. Plan to give Ativan, 1 L fluid bolus. Reports relief with administration of Ativan. She does have hemoglobin in her urine as well as many bacteria. Also plan to obtain CT renal stone study to rule out other causes of abdominal pain.  CT scan does show a small nonobstructive left renal calculus with no hydronephrosis or obstruction. 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 I discussed these findings with the patient.  Upon reevaluation, patient is be resting comfortably, is tolerating POs. She does maintain a mild tachycardia. She does admit that she did not take any of her blood pressure medications today including her carvedilol. Suspect tachycardia and hypertension are due to her not taking medication this morning. No headache, vision changes, chest pain, shortness of breath or changes in urinary habits. No evidence of hypertensive emergency.   Physical exam is otherwise unremarkable, mild abdominal tenderness but no peritoneal signs. Overall, she appears well, nontoxic, afebrile and appropriate for outpatient follow-up. Prescription for Reglan. Discussed follow-up with PCP in the next 2 or 3 days for reevaluation. Discussed recurrent precautions. She verbalizes understanding and agrees with this plan Prior to patient discharge, I discussed and reviewed this case with Dr.Belfi Filed Vitals:   08/05/15 0550 08/05/15 0730 08/05/15 0758 08/05/15 0814  BP: 193/95 170/78 189/92  180/92  Pulse: 102 107 105 108  Temp:  98.8 F (37.1 C)  98.6 F (37 C)  TempSrc:  Oral  Oral  Resp: 18 26 22 24   SpO2: 96% 98% 94% 94%   Results for orders placed or performed during the hospital encounter of 08/05/15  CBC with Differential  Result Value Ref Range   WBC 14.1 (H) 4.0 - 10.5 K/uL   RBC 5.62 (H) 3.87 - 5.11 MIL/uL   Hemoglobin 14.3 12.0 - 15.0 g/dL   HCT 44.3 36.0 - 46.0 %   MCV 78.8 78.0 - 100.0 fL   MCH 25.4 (L) 26.0 - 34.0 pg   MCHC 32.3 30.0 - 36.0 g/dL   RDW 14.8 11.5 - 15.5 %   Platelets 404 (H) 150 - 400 K/uL   Neutrophils Relative % 83 %   Neutro Abs 11.7 (H) 1.7 - 7.7 K/uL   Lymphocytes Relative 14 %   Lymphs Abs 2.0 0.7 - 4.0 K/uL   Monocytes Relative 2 %   Monocytes Absolute 0.3 0.1 - 1.0 K/uL   Eosinophils Relative 1 %   Eosinophils Absolute 0.1 0.0 - 0.7 K/uL   Basophils Relative 0 %   Basophils Absolute 0.0 0.0 - 0.1 K/uL  Comprehensive metabolic panel  Result Value Ref Range   Sodium 138 135 - 145 mmol/L   Potassium 3.9 3.5 - 5.1 mmol/L   Chloride 101 101 - 111 mmol/L   CO2 24 22 - 32 mmol/L   Glucose, Bld 292 (H) 65 - 99 mg/dL   BUN 8 6 - 20 mg/dL   Creatinine, Ser 0.55 0.44 - 1.00 mg/dL   Calcium 9.7 8.9 - 10.3 mg/dL   Total Protein 9.2 (H) 6.5 - 8.1  g/dL   Albumin 4.0 3.5 - 5.0 g/dL   AST 21 15 - 41 U/L   ALT 15 14 - 54 U/L   Alkaline Phosphatase 132 (H) 38 - 126 U/L   Total Bilirubin 0.4 0.3 - 1.2 mg/dL   GFR calc non Af Amer >60 >60 mL/min   GFR calc Af Amer >60 >60 mL/min   Anion gap 13 5 - 15  Lipase, blood  Result Value Ref Range   Lipase 32 11 - 51 U/L  Troponin I  Result Value Ref Range   Troponin I <0.03 <0.031 ng/mL  Urinalysis, Routine w reflex microscopic (not at [facility])  Result Value Ref Range   Color, Urine YELLOW YELLOW   APPearance CLEAR CLEAR   Specific Gravity, Urine 1.020 1.005 - 1.030   pH 7.5 5.0 - 8.0   Glucose, UA >1000 (A) NEGATIVE mg/dL   Hgb urine dipstick MODERATE (A) NEGATIVE   Bilirubin Urine  NEGATIVE NEGATIVE   Ketones, ur 15 (A) NEGATIVE mg/dL   Protein, ur >300 (A) NEGATIVE mg/dL   Nitrite NEGATIVE NEGATIVE   Leukocytes, UA NEGATIVE NEGATIVE  Urine microscopic-add on  Result Value Ref Range   Squamous Epithelial / LPF 0-5 (A) NONE SEEN   WBC, UA 0-5 0 - 5 WBC/hpf   RBC / HPF 6-30 0 - 5 RBC/hpf   Bacteria, UA MANY (A) NONE SEEN  CBG monitoring, ED  Result Value Ref Range   Glucose-Capillary 288 (H) 65 - 99 mg/dL   Dg Abd Acute W/chest  08/05/2015  CLINICAL DATA:  Acute onset of nausea.  Initial encounter. EXAM: DG ABDOMEN ACUTE W/ 1V CHEST COMPARISON:  CTA of the chest, and CT of the abdomen and pelvis, performed 01/19/2012 FINDINGS: The lungs are well-aerated. Minimal left basilar atelectasis is noted. There is no evidence of pleural effusion or pneumothorax. The cardiomediastinal silhouette is borderline normal in size. The visualized bowel gas pattern is unremarkable. Scattered stool and air are seen within the colon; there is no evidence of small bowel dilatation to suggest obstruction. No free intra-abdominal air is identified on the provided decubitus view. Clips are noted within the right upper quadrant, reflecting prior cholecystectomy. No acute osseous abnormalities are seen; the sacroiliac joints are unremarkable in appearance. IMPRESSION: 1. Minimal left basilar atelectasis noted.  Lungs otherwise clear. 2. Unremarkable bowel gas pattern; no free intra-abdominal air seen. Moderate amount of stool noted in the colon. Electronically Signed   By: Garald Balding M.D.   On: 08/05/2015 06:27   Ct Renal Stone Study  08/05/2015  CLINICAL DATA:  Nausea. EXAM: CT ABDOMEN AND PELVIS WITHOUT CONTRAST TECHNIQUE: Multidetector CT imaging of the abdomen and pelvis was performed following the standard protocol without IV contrast. COMPARISON:  CT scan of January 19, 2012. FINDINGS: Visualized lung bases are unremarkable. No significant osseous abnormality is noted. Status post  cholecystectomy. No focal abnormality is noted in the liver, spleen or pancreas on these unenhanced images. Adrenal glands appear normal. Small nonobstructive calculus is noted in lower pole collecting system of right kidney. No hydronephrosis or renal obstruction is noted. No ureteral calculi are noted. 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 The appendix appears normal. There is no evidence of bowel obstruction. No abnormal fluid collection is noted. Urinary bladder is mildly distended. Fibroid uterus is noted. Mild fat containing periumbilical hernia is noted. No significant adenopathy is noted. There is no evidence of abdominal aortic aneurysm. IMPRESSION: Small nonobstructive left renal calculus. No  hydronephrosis or renal obstruction is noted. 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 CT angiography on nonemergent basis is recommended for further evaluation. Probable fibroid uterus is noted. Mild fat containing periumbilical hernia. Electronically Signed   By: Marijo Conception, M.D.   On: 08/05/2015 09:04     I personally reviewed the imaging and agree with the results as interpreted by the radiologist.  The patient appears reasonably screened and/or stabilized for discharge and I doubt any other medical condition or other [facility] requiring further screening, evaluation, or treatment in the ED at this time prior to discharge.       Comer Locket, PA-C 08/05/15 [place], MD 08/05/15 1148

## 2015-08-05 NOTE — ED Notes (Signed)
Pt states that she's been vomiting since 11pm

## 2015-08-05 NOTE — Discharge Instructions (Signed)
There does not appear to be an emergent cause for your symptoms at this time. It is important feet follow-up with your doctor in the next 2 days for reevaluation. It is also important to stay well-hydrated, drink plenty of water or Gatorade. You'll need to take your blood pressure medication to help with her blood pressure and heart rate. Your CT scan did show an aneurysm in your renal artery that he will need to have followed up by your PCP. Return to ED for any new, worsening or other concerning symptoms.

## 2015-08-05 NOTE — ED Provider Notes (Signed)
CSN: [id number]     Arrival date & time 08/05/15  0356 History   First MD Initiated Contact with Patient 08/05/15 0404     Chief Complaint  Patient presents with  . Emesis     (Consider location/radiation/quality/duration/timing/severity/associated sxs/prior Treatment) HPI Comments: 55 year old female with a history of diabetes mellitus, hypertension, and dyslipidemia presents to the emergency department for sudden onset of nausea which began at 2300 yesterday. Patient reports persistent nausea which has been waxing and waning, but, overall, worsening in severity. She has had 7 episodes of nonbloody, nonbilious emesis since onset of her symptoms. Patient denies taking any medication for her pain. She does state that she took her sugar today and it was slightly elevated, in the 300s. She reports taking all of her prescribed medication and denies missing any doses. Patient has no chest pain, abdominal pain, diarrhea, or fever. No syncope prior to arrival. Patient reports a normal bowel movement yesterday. Abdominal surgical history significant for cesarean section 2 and laparoscopic cholecystectomy.  Patient is a 55 y.o. female presenting with vomiting. The history is provided by the patient. No language interpreter was used.  Emesis Associated symptoms: no abdominal pain and no diarrhea     Past Medical History  Diagnosis Date  . Diabetes mellitus   . Hypertension   . GERD (gastroesophageal reflux disease)   . Nausea   . Hyperlipidemia    Past Surgical History  Procedure Laterality Date  . Breast surgery      reduction  . Cesarean section      x2  . Cholecystectomy      laparoscopic   Family History  Problem Relation Age of Onset  . Hypertension Mother   . Diabetes Mother   . Cancer Father     lung   Social History  Substance Use Topics  . Smoking status: Former Smoker -- 10 years    Types: Cigarettes    Quit date: 10/16/2011  . Smokeless tobacco: Never Used     Comment:  1 pack will last a week  . Alcohol Use: No   OB History    No data available      Review of Systems  Constitutional: Negative for fever.  Cardiovascular: Negative for chest pain.  Gastrointestinal: Positive for nausea and vomiting. Negative for abdominal pain, diarrhea, constipation and blood in stool.  Neurological: Negative for syncope.  All other systems reviewed and are negative.   Allergies  Lisinopril  Home Medications   Prior to Admission medications   Medication Sig Start Date End Date Taking? Authorizing Provider  amLODipine (NORVASC) 10 MG tablet TAKE ONE TABLET BY MOUTH ONCE DAILY Patient taking differently: TAKE 10 MG BY MOUTH ONCE DAILY 03/17/15  Yes Donnamae Jude, MD  aspirin 325 MG tablet Take 1 tablet (325 mg total) by mouth daily. 05/11/14  Yes Donnamae Jude, MD  carvedilol (COREG) 25 MG tablet TAKE ONE TABLET BY MOUTH TWICE DAILY WITH MEALS Patient taking differently: TAKE 25 MG BY MOUTH TWICE DAILY WITH MEALS 03/17/15  Yes Donnamae Jude, MD  cloNIDine (CATAPRES) 0.3 MG tablet TAKE ONE TABLET BY MOUTH TWICE DAILY Patient taking differently: TAKE 0.3 MG BY MOUTH TWICE DAILY 03/17/15  Yes Donnamae Jude, MD  EXEL COMFORT POINT PEN NEEDLE 29G X 12MM MISC AS DIRECTED 06/19/15  Yes Donnamae Jude, MD  hydrochlorothiazide (HYDRODIURIL) 25 MG tablet TAKE ONE TABLET BY MOUTH ONCE DAILY. Patient taking differently: TAKE 25 MG BY MOUTH ONCE DAILY.  05/29/15  Yes Donnamae Jude, MD  Insulin Syringe-Needle U-100 28G X 1/2" 0.5 ML MISC 1 Units by Does not apply route as directed. 06/26/15  Yes Donnamae Jude, MD  Lancets (ACCU-CHEK SOFT TOUCH) lancets TAKE AS DIRECTED. 05/20/15  Yes Tanna Savoy Stinson, DO  LANTUS 100 UNIT/ML injection INJECT 20 UNITS INTO THE SKIN DAILY 07/19/15  Yes Donnamae Jude, MD  losartan (COZAAR) 50 MG tablet TAKE ONE TABLET BY MOUTH ONCE DAILY Patient taking differently: TAKE 50 MG BY MOUTH ONCE DAILY 01/31/15  Yes Donnamae Jude, MD  metFORMIN (GLUCOPHAGE) 500 MG  tablet Take 1 tablet (500 mg total) by mouth 2 (two) times daily with a meal. 05/11/14  Yes Donnamae Jude, MD  promethazine (PHENERGAN) 25 MG tablet TAKE ONE-HALF TO ONE TABLET BY MOUTH 4 TIMES DAILY Patient taking differently: Take 12.5-25 mg by mouth every 6 (six) hours as needed for nausea or vomiting.  06/19/15  Yes Donnamae Jude, MD  traMADol (ULTRAM) 50 MG tablet Take 1 tablet (50 mg total) by mouth every 6 (six) hours as needed for moderate pain. 06/19/15  Yes Donnamae Jude, MD  cyclobenzaprine (FLEXERIL) 10 MG tablet TAKE ONE TABLET BY MOUTH EVERY 8 HOURS AS NEEDED FOR MUSCLE SPASM Patient not taking: Reported on 08/05/2015 07/12/15   Donnamae Jude, MD  EPINEPHrine (EPIPEN) 0.3 mg/0.3 mL SOAJ injection Inject 0.3 mLs (0.3 mg total) into the muscle once. Patient not taking: Reported on 08/05/2015 05/12/13   Waldemar Dickens, MD  omeprazole (PRILOSEC) 20 MG capsule Take 1 capsule (20 mg total) by mouth 2 (two) times daily. Patient not taking: Reported on 08/05/2015 05/11/14   Donnamae Jude, MD   BP 193/95 mmHg  Pulse 102  Temp(Src) 97.8 F (36.6 C) (Oral)  Resp 18  SpO2 96%   Physical Exam  Constitutional: She is oriented to person, place, and time. She appears well-developed and well-nourished. No distress.  Patient appears uncomfortable; nontoxic.  HENT:  Head: Normocephalic and atraumatic.  Mildly dry mm  Eyes: Conjunctivae and EOM are normal. No scleral icterus.  Neck: Normal range of motion.  Cardiovascular: Regular rhythm and intact distal pulses.   Mild tachycardia  Pulmonary/Chest: Effort normal and breath sounds normal. No respiratory distress. She has no wheezes. She has no rales.  No tachypnea or dyspnea. Chest expansion symmetric. Lungs grossly clear.  Abdominal: Soft. There is no tenderness. There is no rebound and no guarding.  Soft, obese abdomen. No masses or TTP. No rigidity.  Musculoskeletal: Normal range of motion.  Neurological: She is alert and oriented to person, place,  and time. She exhibits normal muscle tone. Coordination normal.  Skin: Skin is warm and dry. No rash noted. She is not diaphoretic. No erythema. No pallor.  Psychiatric: She has a normal mood and affect. Her behavior is normal.  Nursing note and vitals reviewed.   ED Course  Procedures (including critical care time) Labs Review Labs Reviewed  CBC WITH DIFFERENTIAL/PLATELET - Abnormal; Notable for the following:    WBC 14.1 (*)    RBC 5.62 (*)    MCH 25.4 (*)    Platelets 404 (*)    Neutro Abs 11.7 (*)    All other components within normal limits  COMPREHENSIVE METABOLIC PANEL - Abnormal; Notable for the following:    Glucose, Bld 292 (*)    Total Protein 9.2 (*)    Alkaline Phosphatase 132 (*)    All other components within normal limits  CBG MONITORING, ED - Abnormal; Notable for the following:    Glucose-Capillary 288 (*)    All other components within normal limits  LIPASE, BLOOD  TROPONIN I  URINALYSIS, ROUTINE W REFLEX MICROSCOPIC (NOT AT [facility])    Imaging Review No results found.   I have personally reviewed and evaluated these images and lab results as part of my medical decision-making.   EKG Interpretation   Date/Time:  Saturday August 05 2015 05:09:47 EDT Ventricular Rate:  94 PR Interval:  177 QRS Duration: 101 QT Interval:  361 QTC Calculation: 451 R Axis:   46 Text Interpretation:  Sinus rhythm Ventricular premature complex Baseline  wander in lead(s) II No significant change since last tracing Confirmed by  [facility]  MD, MARTHA [phone]) on 08/05/2015 5:15:19 AM      MDM   Final diagnoses:  Non-intractable vomiting with nausea, vomiting of unspecified type    55 year old female with a history of hypertension, dyslipidemia, and diabetes mellitus presents to the emergency department for onset of nausea and vomiting. Symptoms began at 2300. Patient reports 7 episodes of emesis prior to arrival. Patient with no complaints of abdominal pain and abdomen is  soft and grossly nontender. Patient does have a leukocytosis today which is thought to be secondary to the stress of emesis rather than infection as patient is afebrile. Mild hyperglycemia without DKA. No evidence of ischemic change on EKG. Troponin negative. Doubt atypically presenting ACS.  Suspect symptoms to be secondary to gastroparesis due to diabetes history. Patient continued to complain of nausea despite Zofran. Reglan ordered as well as Bentyl. We will further evaluate with abdominal x-ray. Patient signed out to Comer Locket, PA-C at change of shift who will follow-up on imaging and reassess.    Antonietta Breach, PA-C 08/05/15 [place], MD 08/05/15 [phone]

## 2015-08-06 ENCOUNTER — Emergency Department (HOSPITAL_COMMUNITY): Payer: Medicaid Other

## 2015-08-06 ENCOUNTER — Observation Stay (HOSPITAL_COMMUNITY)
Admission: EM | Admit: 2015-08-06 | Discharge: 2015-08-08 | Disposition: A | Discharge status: Home / Self Care | Payer: Medicaid Other | Source: Home / Self Care | Attending: Emergency Medicine | Admitting: Emergency Medicine

## 2015-08-06 ENCOUNTER — Encounter (HOSPITAL_COMMUNITY): Payer: Self-pay

## 2015-08-06 DIAGNOSIS — E785 Hyperlipidemia, unspecified: Secondary | ICD-10-CM | POA: Insufficient documentation

## 2015-08-06 DIAGNOSIS — Z79899 Other long term (current) drug therapy: Secondary | ICD-10-CM

## 2015-08-06 DIAGNOSIS — R0789 Other chest pain: Secondary | ICD-10-CM

## 2015-08-06 DIAGNOSIS — E860 Dehydration: Secondary | ICD-10-CM | POA: Diagnosis present

## 2015-08-06 DIAGNOSIS — R112 Nausea with vomiting, unspecified: Secondary | ICD-10-CM | POA: Insufficient documentation

## 2015-08-06 DIAGNOSIS — E119 Type 2 diabetes mellitus without complications: Secondary | ICD-10-CM | POA: Insufficient documentation

## 2015-08-06 DIAGNOSIS — R1084 Generalized abdominal pain: Secondary | ICD-10-CM | POA: Insufficient documentation

## 2015-08-06 DIAGNOSIS — I10 Essential (primary) hypertension: Secondary | ICD-10-CM | POA: Insufficient documentation

## 2015-08-06 DIAGNOSIS — Z7984 Long term (current) use of oral hypoglycemic drugs: Secondary | ICD-10-CM | POA: Insufficient documentation

## 2015-08-06 DIAGNOSIS — R1110 Vomiting, unspecified: Secondary | ICD-10-CM

## 2015-08-06 DIAGNOSIS — E1122 Type 2 diabetes mellitus with diabetic chronic kidney disease: Secondary | ICD-10-CM | POA: Insufficient documentation

## 2015-08-06 DIAGNOSIS — E1165 Type 2 diabetes mellitus with hyperglycemia: Secondary | ICD-10-CM

## 2015-08-06 DIAGNOSIS — I722 Aneurysm of renal artery: Secondary | ICD-10-CM | POA: Insufficient documentation

## 2015-08-06 DIAGNOSIS — Z794 Long term (current) use of insulin: Secondary | ICD-10-CM | POA: Insufficient documentation

## 2015-08-06 LAB — CBC
HEMATOCRIT: 48.6 % — AB (ref 36.0–46.0)
HEMOGLOBIN: 17 g/dL — AB (ref 12.0–15.0)
MCH: 26.1 pg (ref 26.0–34.0)
MCHC: 35 g/dL (ref 30.0–36.0)
MCV: 74.7 fL — ABNORMAL LOW (ref 78.0–100.0)
Platelets: 568 10*3/uL — ABNORMAL HIGH (ref 150–400)
RBC: 6.51 MIL/uL — AB (ref 3.87–5.11)
RDW: 14.3 % (ref 11.5–15.5)
WBC: 17.6 10*3/uL — ABNORMAL HIGH (ref 4.0–10.5)

## 2015-08-06 LAB — URINE MICROSCOPIC-ADD ON

## 2015-08-06 LAB — COMPREHENSIVE METABOLIC PANEL
ALK PHOS: 154 U/L — AB (ref 38–126)
ALT: 12 U/L — ABNORMAL LOW (ref 14–54)
ANION GAP: 14 (ref 5–15)
AST: 17 U/L (ref 15–41)
Albumin: 3.8 g/dL (ref 3.5–5.0)
BILIRUBIN TOTAL: 0.8 mg/dL (ref 0.3–1.2)
BUN: 16 mg/dL (ref 6–20)
CALCIUM: 9.7 mg/dL (ref 8.9–10.3)
CO2: 23 mmol/L (ref 22–32)
Chloride: 97 mmol/L — ABNORMAL LOW (ref 101–111)
Creatinine, Ser: 0.89 mg/dL (ref 0.44–1.00)
GFR calc non Af Amer: >60 mL/min (ref >60)
Glucose, Bld: 363 mg/dL — ABNORMAL HIGH (ref 65–99)
POTASSIUM: 3.2 mmol/L — AB (ref 3.5–5.1)
SODIUM: 134 mmol/L — AB (ref 135–145)
TOTAL PROTEIN: 9.6 g/dL — AB (ref 6.5–8.1)

## 2015-08-06 LAB — I-STAT TROPONIN, ED: Troponin i, poc: 0.06 ng/mL (ref 0.00–0.08)

## 2015-08-06 LAB — URINALYSIS, ROUTINE W REFLEX MICROSCOPIC
Bilirubin Urine: NEGATIVE
Glucose, UA: >1000 mg/dL — AB
Ketones, ur: NEGATIVE mg/dL
Leukocytes, UA: NEGATIVE
NITRITE: NEGATIVE
Protein, ur: >300 mg/dL — AB
SPECIFIC GRAVITY, URINE: 1.02 (ref 1.005–1.030)
pH: 6 (ref 5.0–8.0)

## 2015-08-06 LAB — LIPASE, BLOOD: Lipase: 30 U/L (ref 11–51)

## 2015-08-06 LAB — CBG MONITORING, ED: Glucose-Capillary: 382 mg/dL — ABNORMAL HIGH (ref 65–99)

## 2015-08-06 LAB — I-STAT CG4 LACTIC ACID, ED: Lactic Acid, Venous: 1.36 mmol/L (ref 0.5–2.0)

## 2015-08-06 IMAGING — CR DG CHEST 2V
2 series · 2 of 2 positions shown · non-contrast
Comparison: [DATE]

CLINICAL DATA: Nausea and vomiting 3 days. Some chest pain and
dizziness.

EXAM:
CHEST  2 VIEW

[w chest pa]
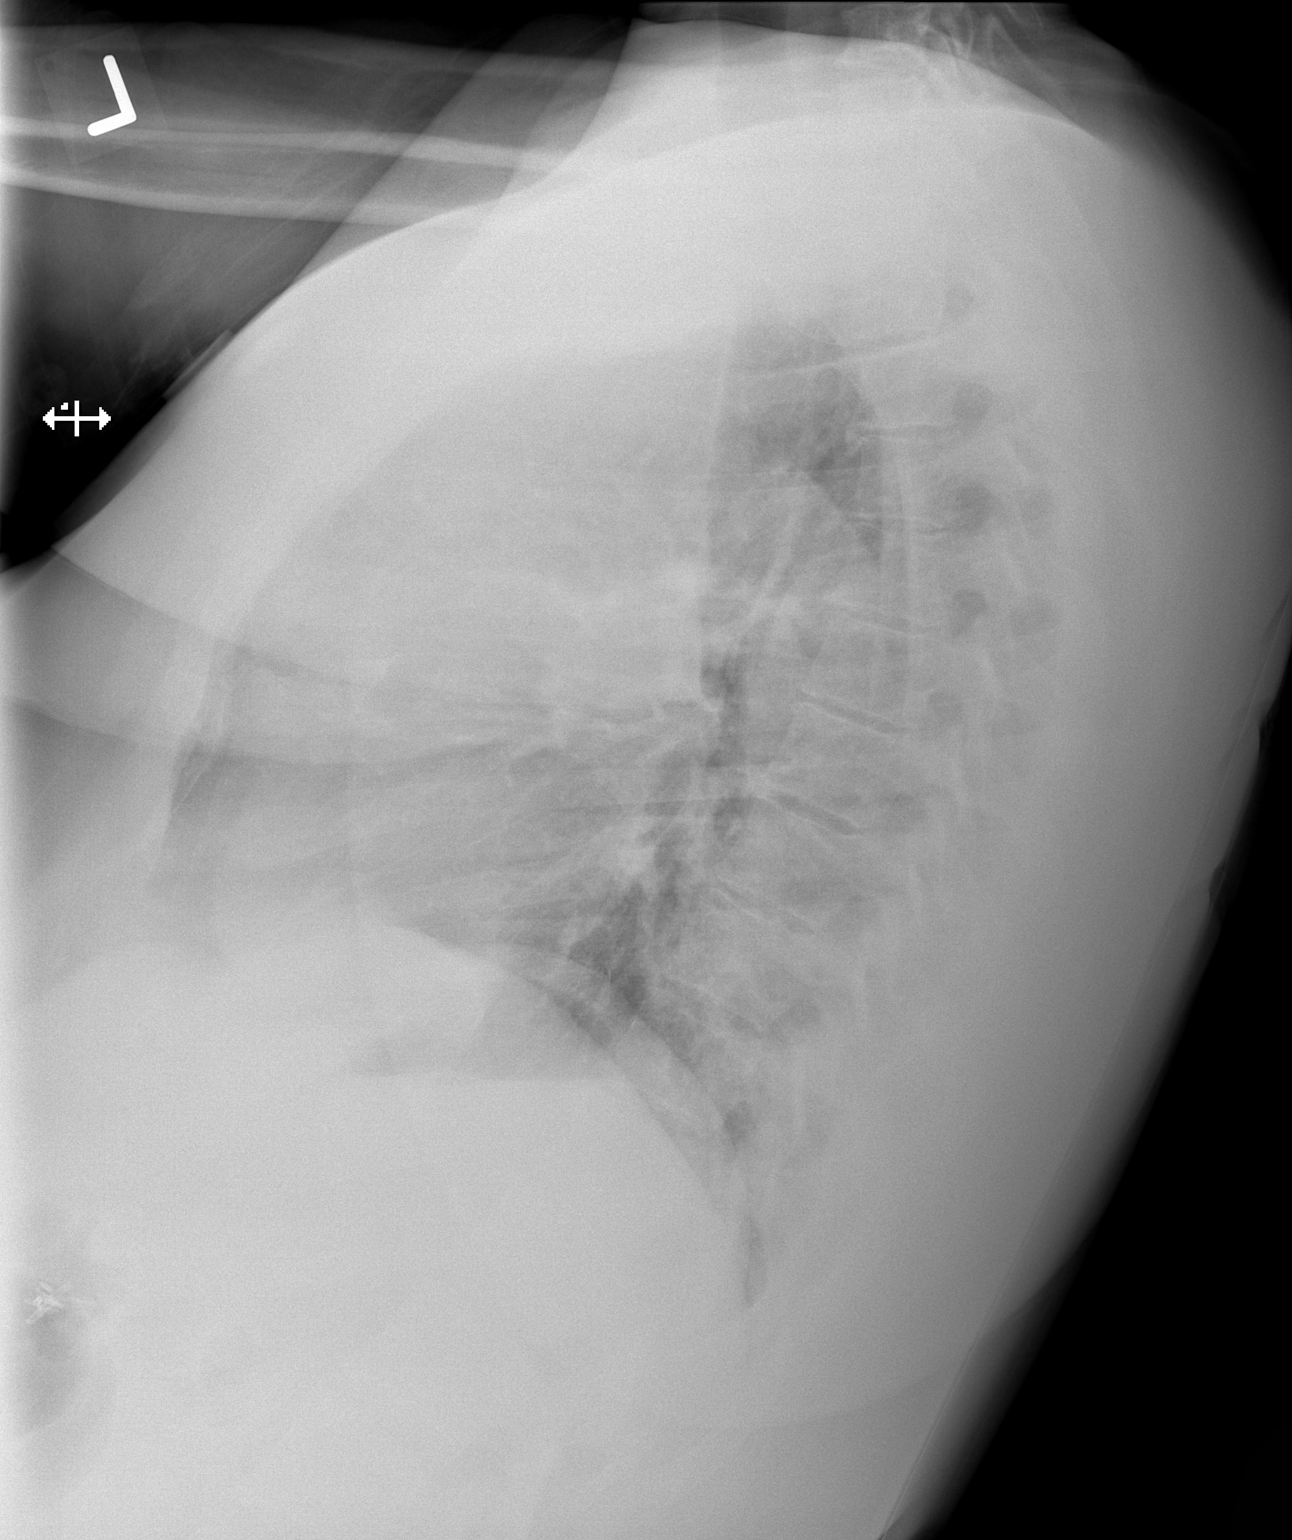

[x chest ap]
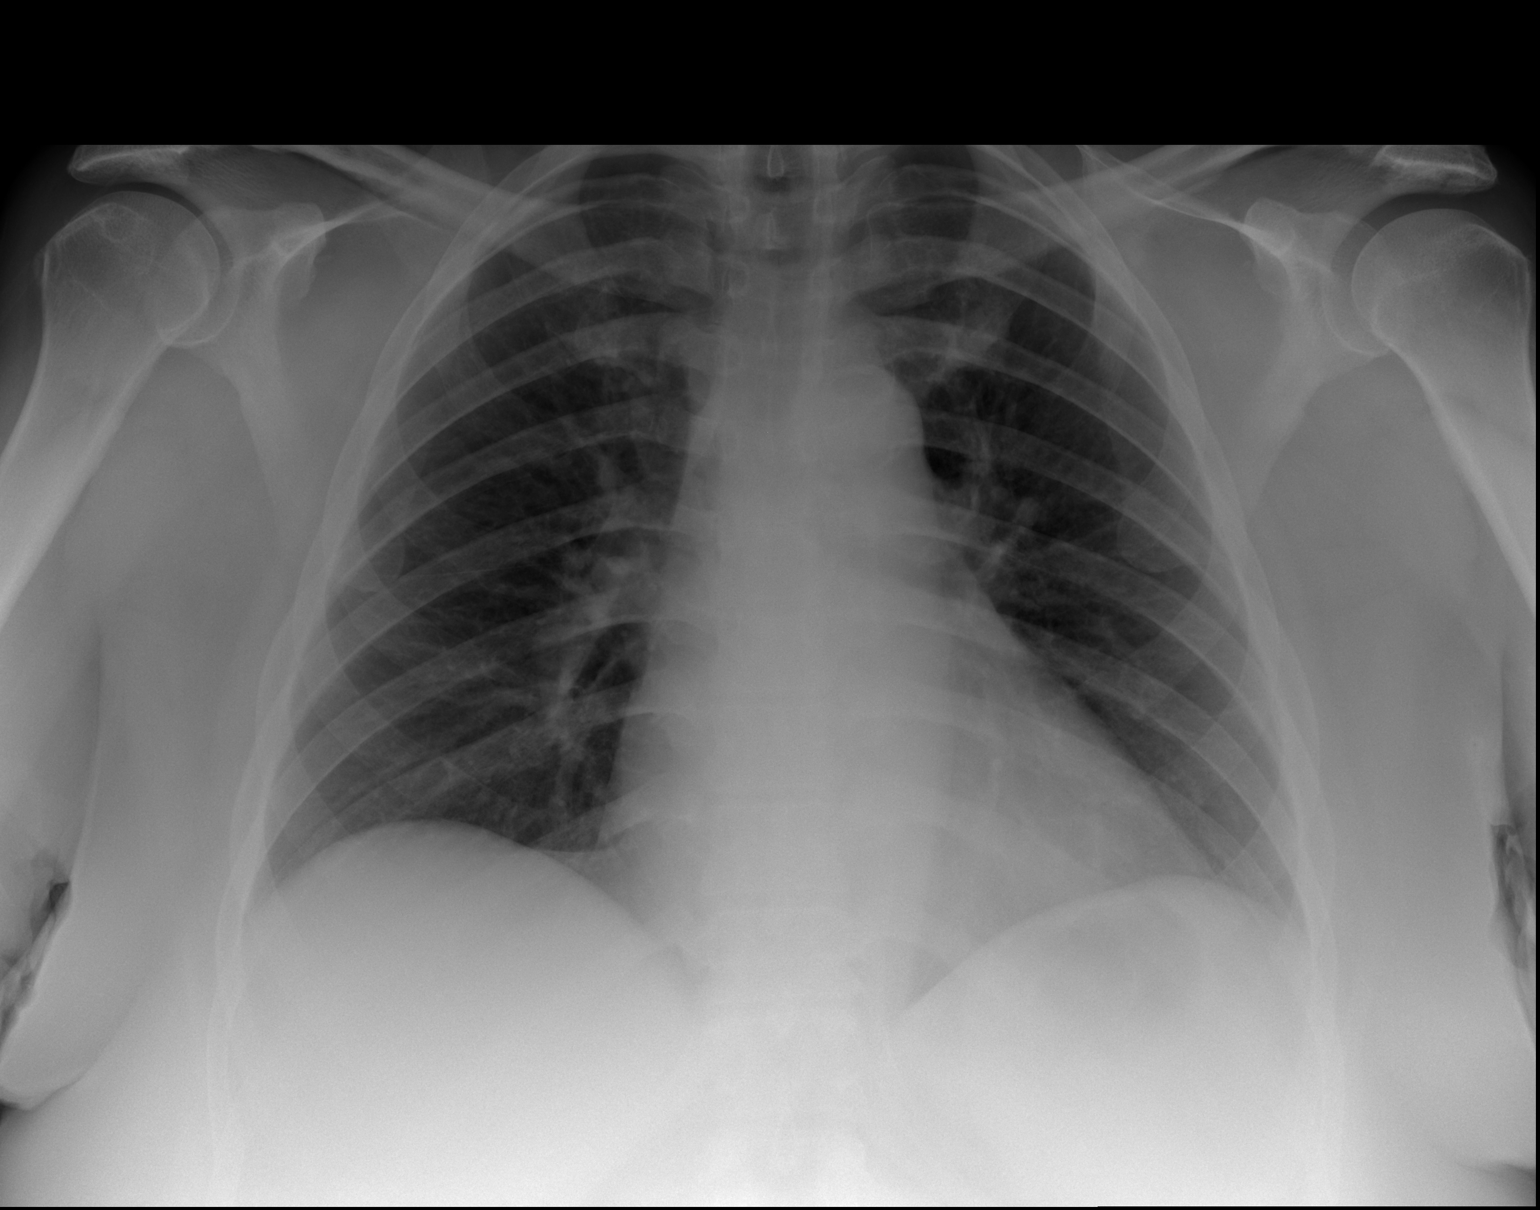

[2 of 2 positions shown; findings below may reference images not displayed]

FINDINGS: Lungs are adequately inflated without focal consolidation or
effusion. Cardiomediastinal silhouette is within normal. Bony
structures are within normal. There are prominent overlying soft
tissues.
IMPRESSION: No active cardiopulmonary disease.

## 2015-08-06 MED ORDER — LORAZEPAM 2 MG/ML IJ SOLN
0.5000 mg | Freq: Once | INTRAMUSCULAR | Status: AC
  Administered 2015-08-06: 0.5 mg via INTRAVENOUS
  Filled 2015-08-06: qty 1

## 2015-08-06 MED ORDER — DEXTROSE 5 % IV SOLN
1.0000 g | Freq: Once | INTRAVENOUS | Status: AC
  Administered 2015-08-06: 1 g via INTRAVENOUS
  Filled 2015-08-06: qty 10

## 2015-08-06 MED ORDER — ONDANSETRON 4 MG PO TBDP
4.0000 mg | ORAL_TABLET | Freq: Once | ORAL | Status: AC | PRN
  Administered 2015-08-06: 4 mg via ORAL
  Filled 2015-08-06: qty 1

## 2015-08-06 MED ORDER — PROCHLORPERAZINE EDISYLATE 5 MG/ML IJ SOLN
10.0000 mg | Freq: Once | INTRAMUSCULAR | Status: AC
  Administered 2015-08-06: 10 mg via INTRAVENOUS
  Filled 2015-08-06: qty 2

## 2015-08-06 MED ORDER — SODIUM CHLORIDE 0.9 % IV BOLUS (SEPSIS)
1000.0000 mL | Freq: Once | INTRAVENOUS | Status: AC
  Administered 2015-08-06: 1000 mL via INTRAVENOUS

## 2015-08-06 NOTE — ED Notes (Signed)
Pt c/o N&V since Friday. Pt denies fever or diarrhea. Pt denies ShOB, but states that today she developed some dizziness upon standing and CP that gets worse with vomiting.  Pt states she was evaluated in the ED on Friday and given an antiemetic, but that has not been helping. Pt states that she has been unable to keep down any food or liquids since onset (2 days).

## 2015-08-06 NOTE — ED Notes (Signed)
Attempted an IV x 2 but was unsuccessful. Notified Manuela Schwartz, RN who performed ultrasound guided IV's. She reports she will come to bedside. Informed Jada, RN (primary nurse) of this information.

## 2015-08-06 NOTE — ED Provider Notes (Signed)
CSN: [id number]     Arrival date & time 08/06/15  1601 History   First MD Initiated Contact with Patient 08/06/15 1727     Chief Complaint  Patient presents with  . Nausea     (Consider location/radiation/quality/duration/timing/severity/associated sxs/prior Treatment) HPI Patient presents to the emergency department with nausea, vomiting that started last night and she was seen in the emergency department early yesterday morning.  Patient states that she was feeling some better when she left, still felt nauseated.  She states that she is unable to keep any medicines down that is what brought her back to the emergency department.  Patient states that nothing seems make the condition better oral intake makes her condition worse. The patient denies chest pain, shortness of breath, headache,blurred vision, neck pain, fever, cough, weakness, numbness, dizziness, anorexia, edema, abdominal pain, nausea, vomiting, diarrhea, rash, back pain, dysuria, hematemesis, bloody stool, near syncope, or syncope. Past Medical History  Diagnosis Date  . Diabetes mellitus   . Hypertension   . GERD (gastroesophageal reflux disease)   . Nausea   . Hyperlipidemia    Past Surgical History  Procedure Laterality Date  . Breast surgery      reduction  . Cesarean section      x2  . Cholecystectomy      laparoscopic   Family History  Problem Relation Age of Onset  . Hypertension Mother   . Diabetes Mother   . Cancer Father     lung   Social History  Substance Use Topics  . Smoking status: Former Smoker -- 10 years    Types: Cigarettes    Quit date: 10/16/2011  . Smokeless tobacco: Never Used     Comment: 1 pack will last a week  . Alcohol Use: No   OB History    No data available     Review of Systems  All other systems negative except as documented in the HPI. All pertinent positives and negatives as reviewed in the HPI.  Allergies  Lisinopril  Home Medications   Prior to Admission  medications   Medication Sig Start Date End Date Taking? Authorizing Provider  amLODipine (NORVASC) 10 MG tablet TAKE ONE TABLET BY MOUTH ONCE DAILY Patient taking differently: TAKE 10 MG BY MOUTH ONCE DAILY 03/17/15  Yes Donnamae Jude, MD  aspirin 325 MG tablet Take 1 tablet (325 mg total) by mouth daily. 05/11/14  Yes Donnamae Jude, MD  carvedilol (COREG) 25 MG tablet TAKE ONE TABLET BY MOUTH TWICE DAILY WITH MEALS Patient taking differently: TAKE 25 MG BY MOUTH TWICE DAILY WITH MEALS 03/17/15  Yes Donnamae Jude, MD  cloNIDine (CATAPRES) 0.3 MG tablet TAKE ONE TABLET BY MOUTH TWICE DAILY Patient taking differently: TAKE 0.3 MG BY MOUTH TWICE DAILY 03/17/15  Yes Donnamae Jude, MD  cyclobenzaprine (FLEXERIL) 10 MG tablet TAKE ONE TABLET BY MOUTH EVERY 8 HOURS AS NEEDED FOR MUSCLE SPASM 07/12/15  Yes Donnamae Jude, MD  hydrochlorothiazide (HYDRODIURIL) 25 MG tablet TAKE ONE TABLET BY MOUTH ONCE DAILY. Patient taking differently: TAKE 25 MG BY MOUTH ONCE DAILY. 05/29/15  Yes Donnamae Jude, MD  LANTUS 100 UNIT/ML injection INJECT 20 UNITS INTO THE SKIN DAILY 07/19/15  Yes Donnamae Jude, MD  losartan (COZAAR) 50 MG tablet TAKE ONE TABLET BY MOUTH ONCE DAILY Patient taking differently: TAKE 50 MG BY MOUTH ONCE DAILY 01/31/15  Yes Donnamae Jude, MD  metFORMIN (GLUCOPHAGE) 500 MG tablet Take 1 tablet (500 mg total)  by mouth 2 (two) times daily with a meal. 05/11/14  Yes Donnamae Jude, MD  metoCLOPramide (REGLAN) 10 MG tablet Take 1 tablet (10 mg total) by mouth every 6 (six) hours. 08/05/15  Yes Benjamin Cartner, PA-C  promethazine (PHENERGAN) 25 MG tablet TAKE ONE-HALF TO ONE TABLET BY MOUTH 4 TIMES DAILY Patient taking differently: Take 12.5-25 mg by mouth every 6 (six) hours as needed for nausea or vomiting.  06/19/15  Yes Donnamae Jude, MD  traMADol (ULTRAM) 50 MG tablet Take 1 tablet (50 mg total) by mouth every 6 (six) hours as needed for moderate pain. 06/19/15  Yes Donnamae Jude, MD  EPINEPHrine (EPIPEN) 0.3  mg/0.3 mL SOAJ injection Inject 0.3 mLs (0.3 mg total) into the muscle once. Patient not taking: Reported on 08/05/2015 05/12/13   Waldemar Dickens, MD  EXEL COMFORT POINT PEN NEEDLE 29G X 12MM MISC AS DIRECTED 06/19/15   Donnamae Jude, MD  Insulin Syringe-Needle U-100 28G X 1/2" 0.5 ML MISC 1 Units by Does not apply route as directed. 06/26/15   Donnamae Jude, MD  Lancets (ACCU-CHEK SOFT TOUCH) lancets TAKE AS DIRECTED. 05/20/15   Tanna Savoy Stinson, DO   BP 205/92 mmHg  Pulse 124  Temp(Src) 98.3 F (36.8 C) (Oral)  Resp 33  Ht 5\' 11"  (1.803 m)  Wt 131.543 kg  BMI 40.46 kg/m2  SpO2 96% Physical Exam  Constitutional: She is oriented to person, place, and time. She appears well-developed and well-nourished. No distress.  HENT:  Head: Normocephalic and atraumatic.  Mouth/Throat: Oropharynx is clear and moist. Mucous membranes are dry.  Eyes: Pupils are equal, round, and reactive to light.  Neck: Normal range of motion. Neck supple.  Cardiovascular: Normal rate, regular rhythm and normal heart sounds.  Exam reveals no gallop and no friction rub.   No murmur heard. Pulmonary/Chest: Effort normal and breath sounds normal. No respiratory distress. She has no wheezes.  Abdominal: Soft. Bowel sounds are normal. She exhibits no distension. There is no tenderness.  Neurological: She is alert and oriented to person, place, and time. She exhibits normal muscle tone. Coordination normal.  Skin: Skin is warm and dry. No rash noted. No erythema.  Psychiatric: She has a normal mood and affect. Her behavior is normal.  Nursing note and vitals reviewed.   ED Course  Procedures (including critical care time) Labs Review Labs Reviewed  COMPREHENSIVE METABOLIC PANEL - Abnormal; Notable for the following:    Sodium 134 (*)    Potassium 3.2 (*)    Chloride 97 (*)    Glucose, Bld 363 (*)    Total Protein 9.6 (*)    ALT 12 (*)    Alkaline Phosphatase 154 (*)    All other components within normal limits  CBC  - Abnormal; Notable for the following:    WBC 17.6 (*)    RBC 6.51 (*)    Hemoglobin 17.0 (*)    HCT 48.6 (*)    MCV 74.7 (*)    Platelets 568 (*)    All other components within normal limits  URINALYSIS, ROUTINE W REFLEX MICROSCOPIC (NOT AT [facility]) - Abnormal; Notable for the following:    APPearance CLOUDY (*)    Glucose, UA >1000 (*)    Hgb urine dipstick MODERATE (*)    Protein, ur >300 (*)    All other components within normal limits  URINE MICROSCOPIC-ADD ON - Abnormal; Notable for the following:    Squamous Epithelial / LPF 0-5 (*)  Bacteria, UA MANY (*)    All other components within normal limits  CBG MONITORING, ED - Abnormal; Notable for the following:    Glucose-Capillary 382 (*)    All other components within normal limits  LIPASE, BLOOD  I-STAT TROPOININ, ED  I-STAT CG4 LACTIC ACID, ED    Imaging Review Dg Chest 2 View  08/06/2015  CLINICAL DATA:  Nausea and vomiting 3 days. Some chest pain and dizziness. EXAM: CHEST  2 VIEW COMPARISON:  08/05/2015 FINDINGS: Lungs are adequately inflated without focal consolidation or effusion. Cardiomediastinal silhouette is within normal. Bony structures are within normal. There are prominent overlying soft tissues. IMPRESSION: No active cardiopulmonary disease. Electronically Signed   By: Marin Olp M.D.   On: 08/06/2015 16:52   Dg Abd Acute W/chest  08/05/2015  CLINICAL DATA:  Acute onset of nausea.  Initial encounter. EXAM: DG ABDOMEN ACUTE W/ 1V CHEST COMPARISON:  CTA of the chest, and CT of the abdomen and pelvis, performed 01/19/2012 FINDINGS: The lungs are well-aerated. Minimal left basilar atelectasis is noted. There is no evidence of pleural effusion or pneumothorax. The cardiomediastinal silhouette is borderline normal in size. The visualized bowel gas pattern is unremarkable. Scattered stool and air are seen within the colon; there is no evidence of small bowel dilatation to suggest obstruction. No free intra-abdominal air  is identified on the provided decubitus view. Clips are noted within the right upper quadrant, reflecting prior cholecystectomy. No acute osseous abnormalities are seen; the sacroiliac joints are unremarkable in appearance. IMPRESSION: 1. Minimal left basilar atelectasis noted.  Lungs otherwise clear. 2. Unremarkable bowel gas pattern; no free intra-abdominal air seen. Moderate amount of stool noted in the colon. Electronically Signed   By: Garald Balding M.D.   On: 08/05/2015 06:27   Ct Renal Stone Study  08/05/2015  CLINICAL DATA:  Nausea. EXAM: CT ABDOMEN AND PELVIS WITHOUT CONTRAST TECHNIQUE: Multidetector CT imaging of the abdomen and pelvis was performed following the standard protocol without IV contrast. COMPARISON:  CT scan of January 19, 2012. FINDINGS: Visualized lung bases are unremarkable. No significant osseous abnormality is noted. Status post cholecystectomy. No focal abnormality is noted in the liver, spleen or pancreas on these unenhanced images. Adrenal glands appear normal. Small nonobstructive calculus is noted in lower pole collecting system of right kidney. No hydronephrosis or renal obstruction is noted. No ureteral calculi are noted. 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 The appendix appears normal. There is no evidence of bowel obstruction. No abnormal fluid collection is noted. Urinary bladder is mildly distended. Fibroid uterus is noted. Mild fat containing periumbilical hernia is noted. No significant adenopathy is noted. There is no evidence of abdominal aortic aneurysm. IMPRESSION: Small nonobstructive left renal calculus. No hydronephrosis or renal obstruction is noted. 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 CT angiography on nonemergent basis is recommended for further evaluation. Probable fibroid uterus is noted. Mild fat containing periumbilical hernia. Electronically Signed   By: Marijo Conception, M.D.   On: 08/05/2015 09:04   I  have personally reviewed and evaluated these images and lab results as part of my medical decision-making.   EKG Interpretation   Date/Time:  Sunday August 06 2015 16:25:15 EDT Ventricular Rate:  127 PR Interval:  140 QRS Duration: 98 QT Interval:  319 QTC Calculation: 464 R Axis:   25 Text Interpretation:  Sinus tachycardia Consider right atrial enlargement  Repol abnrm suggests ischemia, lateral leads Since prior ECG, heart  rate  has incerased and repolarization abnormality has developed Confirmed by  [facility] MD, [place] ([id number]) on 08/06/2015 4:35:35 PM      MDM   Final diagnoses:  Intractable vomiting with nausea, vomiting of unspecified type     Patient be admitted to [facility] by the family practice resident.  Patient is advised of the plan and all questions were answered.  Dalia Heading, PA-C 08/07/15 [id number]  Gareth Morgan, MD 08/07/15 2151

## 2015-08-07 DIAGNOSIS — R1110 Vomiting, unspecified: Secondary | ICD-10-CM | POA: Diagnosis not present

## 2015-08-07 DIAGNOSIS — E119 Type 2 diabetes mellitus without complications: Secondary | ICD-10-CM | POA: Diagnosis not present

## 2015-08-07 DIAGNOSIS — E860 Dehydration: Secondary | ICD-10-CM | POA: Diagnosis not present

## 2015-08-07 DIAGNOSIS — I10 Essential (primary) hypertension: Secondary | ICD-10-CM | POA: Diagnosis not present

## 2015-08-07 LAB — CBC
HCT: 48.3 % — ABNORMAL HIGH (ref 36.0–46.0)
Hemoglobin: 15.6 g/dL — ABNORMAL HIGH (ref 12.0–15.0)
MCH: 25.1 pg — ABNORMAL LOW (ref 26.0–34.0)
MCHC: 32.3 g/dL (ref 30.0–36.0)
MCV: 77.7 fL — ABNORMAL LOW (ref 78.0–100.0)
Platelets: 475 10*3/uL — ABNORMAL HIGH (ref 150–400)
RBC: 6.22 MIL/uL — AB (ref 3.87–5.11)
RDW: 14.5 % (ref 11.5–15.5)
WBC: 14.7 10*3/uL — AB (ref 4.0–10.5)

## 2015-08-07 LAB — COMPREHENSIVE METABOLIC PANEL
ALK PHOS: 135 U/L — AB (ref 38–126)
ALT: 16 U/L (ref 14–54)
AST: 17 U/L (ref 15–41)
Albumin: 2.9 g/dL — ABNORMAL LOW (ref 3.5–5.0)
Anion gap: 12 (ref 5–15)
BUN: 12 mg/dL (ref 6–20)
CALCIUM: 9.2 mg/dL (ref 8.9–10.3)
CHLORIDE: 101 mmol/L (ref 101–111)
CO2: 26 mmol/L (ref 22–32)
CREATININE: 0.87 mg/dL (ref 0.44–1.00)
Glucose, Bld: 304 mg/dL — ABNORMAL HIGH (ref 65–99)
Potassium: 3.1 mmol/L — ABNORMAL LOW (ref 3.5–5.1)
Sodium: 139 mmol/L (ref 135–145)
Total Bilirubin: 0.6 mg/dL (ref 0.3–1.2)
Total Protein: 8 g/dL (ref 6.5–8.1)

## 2015-08-07 LAB — CK: Total CK: 57 U/L (ref 38–234)

## 2015-08-07 LAB — LIPID PANEL
Cholesterol: 269 mg/dL — ABNORMAL HIGH (ref 0–200)
HDL: 54 mg/dL (ref >40)
LDL CALC: 195 mg/dL — AB (ref 0–99)
TRIGLYCERIDES: 99 mg/dL (ref <150)
Total CHOL/HDL Ratio: 5 RATIO
VLDL: 20 mg/dL (ref 0–40)

## 2015-08-07 LAB — TROPONIN I
TROPONIN I: 0.06 ng/mL — AB (ref <0.031)
TROPONIN I: 0.06 ng/mL — AB (ref <0.031)
TROPONIN I: 0.05 ng/mL — AB (ref <0.031)

## 2015-08-07 LAB — GLUCOSE, CAPILLARY
GLUCOSE-CAPILLARY: 320 mg/dL — AB (ref 65–99)
GLUCOSE-CAPILLARY: 254 mg/dL — AB (ref 65–99)
GLUCOSE-CAPILLARY: 306 mg/dL — AB (ref 65–99)
Glucose-Capillary: 259 mg/dL — ABNORMAL HIGH (ref 65–99)

## 2015-08-07 LAB — TSH: TSH: 0.667 u[IU]/mL (ref 0.350–4.500)

## 2015-08-07 MED ORDER — HEPARIN SODIUM (PORCINE) 5000 UNIT/ML IJ SOLN
5000.0000 [IU] | Freq: Three times a day (TID) | INTRAMUSCULAR | Status: DC
  Administered 2015-08-07 – 2015-08-08 (×5): 5000 [IU] via SUBCUTANEOUS
  Filled 2015-08-07 (×4): qty 1

## 2015-08-07 MED ORDER — CARVEDILOL 25 MG PO TABS
25.0000 mg | ORAL_TABLET | Freq: Two times a day (BID) | ORAL | Status: DC
  Administered 2015-08-07 – 2015-08-08 (×3): 25 mg via ORAL
  Filled 2015-08-07 (×3): qty 1

## 2015-08-07 MED ORDER — SODIUM CHLORIDE 0.9 % IV SOLN
INTRAVENOUS | Status: AC
  Administered 2015-08-07 (×2): via INTRAVENOUS

## 2015-08-07 MED ORDER — ONDANSETRON HCL 4 MG/2ML IJ SOLN
4.0000 mg | Freq: Four times a day (QID) | INTRAMUSCULAR | Status: DC | PRN
  Administered 2015-08-07: 4 mg via INTRAVENOUS
  Filled 2015-08-07: qty 2

## 2015-08-07 MED ORDER — INSULIN GLARGINE 100 UNIT/ML ~~LOC~~ SOLN
10.0000 [IU] | Freq: Every day | SUBCUTANEOUS | Status: DC
  Administered 2015-08-07: 10 [IU] via SUBCUTANEOUS
  Filled 2015-08-07 (×2): qty 0.1

## 2015-08-07 MED ORDER — ACETAMINOPHEN 650 MG RE SUPP: 650.0000 mg | Freq: Four times a day (QID) | RECTAL | Status: DC | PRN

## 2015-08-07 MED ORDER — POTASSIUM CHLORIDE CRYS ER 20 MEQ PO TBCR
40.0000 meq | EXTENDED_RELEASE_TABLET | Freq: Two times a day (BID) | ORAL | Status: AC
  Administered 2015-08-07 (×2): 40 meq via ORAL
  Filled 2015-08-07 (×2): qty 2

## 2015-08-07 MED ORDER — CLONIDINE HCL 0.3 MG PO TABS
0.3000 mg | ORAL_TABLET | Freq: Two times a day (BID) | ORAL | Status: DC
  Administered 2015-08-07 – 2015-08-08 (×3): 0.3 mg via ORAL
  Filled 2015-08-07 (×3): qty 1

## 2015-08-07 MED ORDER — TRAMADOL HCL 50 MG PO TABS
50.0000 mg | ORAL_TABLET | Freq: Four times a day (QID) | ORAL | Status: DC | PRN
  Administered 2015-08-07 – 2015-08-08 (×5): 50 mg via ORAL
  Filled 2015-08-07 (×5): qty 1

## 2015-08-07 MED ORDER — HYDROCHLOROTHIAZIDE 25 MG PO TABS
25.0000 mg | ORAL_TABLET | Freq: Every day | ORAL | Status: DC
  Administered 2015-08-07: 25 mg via ORAL
  Filled 2015-08-07 (×3): qty 1

## 2015-08-07 MED ORDER — INSULIN ASPART 100 UNIT/ML ~~LOC~~ SOLN
0.0000 [IU] | Freq: Three times a day (TID) | SUBCUTANEOUS | Status: DC
  Administered 2015-08-07: 7 [IU] via SUBCUTANEOUS
  Administered 2015-08-07: 5 [IU] via SUBCUTANEOUS
  Administered 2015-08-07: 7 [IU] via SUBCUTANEOUS
  Administered 2015-08-08: 3 [IU] via SUBCUTANEOUS
  Administered 2015-08-08: 5 [IU] via SUBCUTANEOUS

## 2015-08-07 MED ORDER — CYCLOBENZAPRINE HCL 10 MG PO TABS: 10.0000 mg | ORAL_TABLET | Freq: Three times a day (TID) | ORAL | Status: DC | PRN

## 2015-08-07 MED ORDER — ONDANSETRON HCL 4 MG/2ML IJ SOLN
4.0000 mg | Freq: Once | INTRAMUSCULAR | Status: AC
  Administered 2015-08-07: 4 mg via INTRAVENOUS
  Filled 2015-08-07: qty 2

## 2015-08-07 MED ORDER — AMLODIPINE BESYLATE 10 MG PO TABS
10.0000 mg | ORAL_TABLET | Freq: Every day | ORAL | Status: DC
  Administered 2015-08-07: 10 mg via ORAL
  Filled 2015-08-07: qty 1

## 2015-08-07 MED ORDER — ACETAMINOPHEN 325 MG PO TABS: 650.0000 mg | ORAL_TABLET | Freq: Four times a day (QID) | ORAL | Status: DC | PRN

## 2015-08-07 MED ORDER — ASPIRIN 325 MG PO TABS
325.0000 mg | ORAL_TABLET | Freq: Every day | ORAL | Status: DC
  Administered 2015-08-07 – 2015-08-08 (×2): 325 mg via ORAL
  Filled 2015-08-07 (×2): qty 1

## 2015-08-07 MED ORDER — MORPHINE SULFATE (PF) 4 MG/ML IV SOLN
4.0000 mg | Freq: Once | INTRAVENOUS | Status: AC
  Administered 2015-08-07: 4 mg via INTRAVENOUS
  Filled 2015-08-07: qty 1

## 2015-08-07 NOTE — H&P (Signed)
[facility] Admission History and Physical Service Pager: [phone]  Patient name: DMAYA KHATIB Medical record number: [id number] Date of birth: 24-Jul-1960 Age: 55 y.o. Gender: female  Primary Care Provider: Donnamae Jude, MD Consultants: None Code Status: Full  Chief Complaint: Nausea/Vomiting  Assessment and Plan: TINAMARIE BOSCO is a 55 y.o. female presenting with nausea and vomiting . PMH is significant for T2DM, HTN, and chronic back pain.  Nausea / Vomiting / Dehydration.  Likely viral gastroenteritis. Gastroparesis is also on the differential, though less likely has patient does not have any other complications from her diabetes. She appears moderately dehydrated on admission (dry mucus membranes, tachycardia to 120s). She has mild LLQ tenderness to palpation, otherwise her exam is benign without signs of acute intra-abdominal pathology. Abdominal pain likely secondary to retching.  - IVF: NS @125cc /hr - Will give an additional 1L bolus of NS - NPO, advance as tolerated - Zofran 4mg  IV q6hrs prn - Consider repeat CT scan if not improving or if exam worsens.  - Consider work up for gastroparesis if not improving - Check CK  Chest Pain. Atypical in nature. Likely secondary to retching. Heart score of 4 on admission. Initial troponin 0.06. EKG and CXR unremarkable.  - Trend trop - EKG in AM - Risk stratification labs - Telemetry   Hypokalemia.  K 3.2 on admission. Likely secondary to dehydration / vomiting. - Replete K - Monitor BMP  T2DM. Patient on metformin and lantus 20U daily at home. CBG elevated to 360s on admission, though with normal bicarb and no ketones in UA. - Half home lantus to 10U daily while NPO - Sensitive SSI while NPO - Titrate as needed.  - Hold home metformin  HTN. History of resistant hypertension. Was elevated to 210s/100s in ED. -Continue home norvasc, coreg, clonidine, and HCTZ -Holding home losartan. Restart as  indicated.   Asymptomatic Bacteruria. Many bacteria noted on UA. No urinary symptoms. Patient given 1g of ceftriaxone in ED. - Hold further doses of antibiotics unless patient becomes symptomatic.   Chronic Pain. - Continue home tramadol and flexeril.   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 - CT angiography recommended for further evaluation.  FEN/GI: NPO, NS @125cc /hr Prophylaxis: subQ heparin  Disposition: Admitted pending above management. Anticipate discharge home once tolerating PO.   History of Present Illness:  IWALANI SODA is a 55 y.o. female presenting with nausea and vomiting.  Symptoms started 3 days ago with sudden onset nausea and vomiting. Patient went to the ED and was given antinausea medications, which did not help. Patient has been unable to keep anything down and went back to the ED yesterday. She has not noticed anything that makes the pain better or worse. Associated symptoms include LLQ abdominal pain and central chest pain. She only has pain in her chest when actively vomiting. No shortness of breath. No diarrhea. No sick contacts. Patient has never had similar episodes in the past.   In the ED, patient was found to be tachycardic to the 120s and hypertensive to the 210s/100s. She was given zofran and compazine which helped some with the nausea. She was also given a total of 1L normal saline.   Review Of Systems: Per HPI with the following additions: No dysuria or increased urinary frequency.  Otherwise the remainder of the systems were negative.  Patient Active Problem List   Diagnosis Date Noted  . Dehydration 08/06/2015  . Low back pain 06/09/2014  .  Poorly controlled type 2 diabetes mellitus ([place]) 05/11/2014  . Obstructive sleep apnea of adult 01/16/2012  . GERD (gastroesophageal reflux disease) 01/16/2012  . Osteoarthritis of both knees 01/16/2012  . Hypercholesteremia 07/18/2011  . Diabetes mellitus without complication ([place])  [id number]  . LEIOMYOMA, UTERUS 01/14/2007  . OBESITY, NOS 07/03/2006  . Former smoker 07/03/2006  . Tension headache 07/03/2006  . HYPERTENSION, BENIGN SYSTEMIC 07/03/2006    Past Medical History: Past Medical History  Diagnosis Date  . Diabetes mellitus   . Hypertension   . GERD (gastroesophageal reflux disease)   . Nausea   . Hyperlipidemia     Past Surgical History: Past Surgical History  Procedure Laterality Date  . Breast surgery      reduction  . Cesarean section      x2  . Cholecystectomy      laparoscopic    Social History: Social History  Substance Use Topics  . Smoking status: Former Smoker -- 10 years    Types: Cigarettes    Quit date: 10/16/2011  . Smokeless tobacco: Never Used     Comment: 1 pack will last a week  . Alcohol Use: No   Please also refer to relevant sections of EMR.  Family History: Family History  Problem Relation Age of Onset  . Hypertension Mother   . Diabetes Mother   . Cancer Father     lung   Allergies and Medications: Allergies  Allergen Reactions  . Lisinopril     REACTION: angioedema   No current facility-administered medications on file prior to encounter.   Current Outpatient Prescriptions on File Prior to Encounter  Medication Sig Dispense Refill  . amLODipine (NORVASC) 10 MG tablet TAKE ONE TABLET BY MOUTH ONCE DAILY (Patient taking differently: TAKE 10 MG BY MOUTH ONCE DAILY) 30 tablet 5  . aspirin 325 MG tablet Take 1 tablet (325 mg total) by mouth daily. 30 tablet 5  . carvedilol (COREG) 25 MG tablet TAKE ONE TABLET BY MOUTH TWICE DAILY WITH MEALS (Patient taking differently: TAKE 25 MG BY MOUTH TWICE DAILY WITH MEALS) 60 tablet 5  . cloNIDine (CATAPRES) 0.3 MG tablet TAKE ONE TABLET BY MOUTH TWICE DAILY (Patient taking differently: TAKE 0.3 MG BY MOUTH TWICE DAILY) 60 tablet 5  . cyclobenzaprine (FLEXERIL) 10 MG tablet TAKE ONE TABLET BY MOUTH EVERY 8 HOURS AS NEEDED FOR MUSCLE SPASM 30 tablet 0  .  hydrochlorothiazide (HYDRODIURIL) 25 MG tablet TAKE ONE TABLET BY MOUTH ONCE DAILY. (Patient taking differently: TAKE 25 MG BY MOUTH ONCE DAILY.) 30 tablet 0  . LANTUS 100 UNIT/ML injection INJECT 20 UNITS INTO THE SKIN DAILY 100 vial 3  . losartan (COZAAR) 50 MG tablet TAKE ONE TABLET BY MOUTH ONCE DAILY (Patient taking differently: TAKE 50 MG BY MOUTH ONCE DAILY) 30 tablet 0  . metFORMIN (GLUCOPHAGE) 500 MG tablet Take 1 tablet (500 mg total) by mouth 2 (two) times daily with a meal. 60 tablet 5  . metoCLOPramide (REGLAN) 10 MG tablet Take 1 tablet (10 mg total) by mouth every 6 (six) hours. 30 tablet 0  . promethazine (PHENERGAN) 25 MG tablet TAKE ONE-HALF TO ONE TABLET BY MOUTH 4 TIMES DAILY (Patient taking differently: Take 12.5-25 mg by mouth every 6 (six) hours as needed for nausea or vomiting. ) 42 tablet 0  . traMADol (ULTRAM) 50 MG tablet Take 1 tablet (50 mg total) by mouth every 6 (six) hours as needed for moderate pain. 42 tablet 3  . EPINEPHrine (EPIPEN) 0.3 mg/0.3  mL SOAJ injection Inject 0.3 mLs (0.3 mg total) into the muscle once. (Patient not taking: Reported on 08/05/2015) 2 Device 2  . EXEL COMFORT POINT PEN NEEDLE 29G X 12MM MISC AS DIRECTED 100 each 0  . Insulin Syringe-Needle U-100 28G X 1/2" 0.5 ML MISC 1 Units by Does not apply route as directed. 100 each 4  . Lancets (ACCU-CHEK SOFT TOUCH) lancets TAKE AS DIRECTED. 100 each 0    Objective: BP 193/89 mmHg  Pulse 126  Temp(Src) 98.3 F (36.8 C) (Oral)  Resp 32  Ht 5\' 11"  (1.803 m)  Wt 290 lb (131.543 kg)  BMI 40.46 kg/m2  SpO2 97% Exam: General: Ill appearing 55 year old in NAD, lying on hospital bed.  Eyes: EOMMI, PERRL ENTM: Dry appearing mucus membranes, OP clear Neck: FROM Cardiovascular: Tachycardic, regular rate, no murmurs noted Respiratory: NWOB, CTAB Abdomen: +BS, tender in LLQ, no rebound or guarding, nondistended MSK: No cyanosis or edema Skin: warm, dry Neuro: Alert and oriented, moves all  extremities spontaneously, no focal deficits.  Psych: Normal affect and thought content.   Labs and Imaging: CBC BMET   Recent Labs Lab 08/06/15 1659  WBC 17.6*  HGB 17.0*  HCT 48.6*  PLT 568*    Recent Labs Lab 08/06/15 1659  NA 134*  K 3.2*  CL 97*  CO2 23  BUN 16  CREATININE 0.89  GLUCOSE 363*  CALCIUM 9.7     Trop 0.06 Lactic acid 1.36  Urinalysis    Component Value Date/Time   COLORURINE YELLOW 08/06/2015 1638   APPEARANCEUR CLOUDY* 08/06/2015 1638   LABSPEC 1.020 08/06/2015 1638   PHURINE 6.0 08/06/2015 1638   GLUCOSEU >1000* 08/06/2015 1638   HGBUR MODERATE* 08/06/2015 1638   HGBUR negative 10/16/2006 1403   [place] 08/06/2015 1638   KETONESUR NEGATIVE 08/06/2015 1638   PROTEINUR >300* 08/06/2015 1638   UROBILINOGEN 1.0 01/18/2012 1728   NITRITE NEGATIVE 08/06/2015 1638   LEUKOCYTESUR NEGATIVE 08/06/2015 1638    Dg Chest 2 View  08/06/2015  CLINICAL DATA:  Nausea and vomiting 3 days. Some chest pain and dizziness. EXAM: CHEST  2 VIEW COMPARISON:  08/05/2015 FINDINGS: Lungs are adequately inflated without focal consolidation or effusion. Cardiomediastinal silhouette is within normal. Bony structures are within normal. There are prominent overlying soft tissues. IMPRESSION: No active cardiopulmonary disease. Electronically Signed   By: Marin Olp M.D.   On: 08/06/2015 16:52   EKG: Sinus tachycardia  Vivi Barrack, MD 08/07/2015, 12:29 AM PGY-2, [employer] [facility] pager: [phone], text pages welcome

## 2015-08-07 NOTE — Progress Notes (Addendum)
Inpatient Diabetes Program Recommendations  AACE/ADA: New Consensus Statement on Inpatient Glycemic Control (2015)  Target Ranges:  Prepandial:   less than 140 mg/dL      Peak postprandial:   less than 180 mg/dL (1-2 hours)      Critically ill patients:  140 - 180 mg/dL   Review of Glycemic Control  Diabetes history: DM 2 Outpatient Diabetes medications: Lantus 20 and metformin 500 bid Current orders for Inpatient glycemic control: lantus 10  Inpatient Diabetes Program Recommendations:     Results for MYERS, HAWKS (MRN [id number]) as of 08/07/2015 15:43  Ref. Range 08/05/2015 04:43 08/06/2015 17:20 08/07/2015 07:46 08/07/2015 11:34  Glucose-Capillary Latest Ref Range: 65-99 mg/dL 288 (H) 382 (H) 320 (H) 254 (H)   Patient with nausea and vomiting, history of GERD, probable gastroparesis. Ordered lantu 10 units and sensitive correction tidwc. Noted that patient has history of not taking his insulin as prescribed. With glucose in the 200's-300's, please consider increase in lantus to 20 units to start and may need increase in correction to moderate tidwc. Will follow.  Ad-History states home meds include metformin-may not be appropriate in setting of GERD/Gastroparesis.  Thank you Rosita Kea, RN, MSN, CDE  Diabetes Inpatient Program Office: [phone] Pager: [phone] 8:00 am to 5:00 pm

## 2015-08-08 DIAGNOSIS — E119 Type 2 diabetes mellitus without complications: Secondary | ICD-10-CM | POA: Diagnosis not present

## 2015-08-08 DIAGNOSIS — I10 Essential (primary) hypertension: Secondary | ICD-10-CM | POA: Insufficient documentation

## 2015-08-08 DIAGNOSIS — E860 Dehydration: Secondary | ICD-10-CM | POA: Diagnosis not present

## 2015-08-08 DIAGNOSIS — R1110 Vomiting, unspecified: Secondary | ICD-10-CM | POA: Diagnosis not present

## 2015-08-08 LAB — BASIC METABOLIC PANEL
Anion gap: 9 (ref 5–15)
BUN: 21 mg/dL — ABNORMAL HIGH (ref 6–20)
CALCIUM: 8.8 mg/dL — AB (ref 8.9–10.3)
CO2: 26 mmol/L (ref 22–32)
CREATININE: 1.06 mg/dL — AB (ref 0.44–1.00)
Chloride: 103 mmol/L (ref 101–111)
GFR calc Af Amer: >60 mL/min (ref >60)
GFR calc non Af Amer: 58 mL/min — ABNORMAL LOW (ref >60)
GLUCOSE: 228 mg/dL — AB (ref 65–99)
Potassium: 3.6 mmol/L (ref 3.5–5.1)
Sodium: 138 mmol/L (ref 135–145)

## 2015-08-08 LAB — HEMOGLOBIN A1C
HEMOGLOBIN A1C: 10.4 % — AB (ref 4.8–5.6)
MEAN PLASMA GLUCOSE: 252 mg/dL

## 2015-08-08 LAB — GLUCOSE, CAPILLARY
Glucose-Capillary: 234 mg/dL — ABNORMAL HIGH (ref 65–99)
Glucose-Capillary: 273 mg/dL — ABNORMAL HIGH (ref 65–99)

## 2015-08-08 MED ORDER — LOSARTAN POTASSIUM 50 MG PO TABS: 25.0000 mg | ORAL_TABLET | Freq: Every day | ORAL | Status: DC

## 2015-08-08 MED ORDER — INSULIN GLARGINE 100 UNIT/ML ~~LOC~~ SOLN
15.0000 [IU] | Freq: Every day | SUBCUTANEOUS | Status: DC
  Administered 2015-08-08: 15 [IU] via SUBCUTANEOUS
  Filled 2015-08-08: qty 0.15

## 2015-08-08 MED ORDER — ONDANSETRON HCL 4 MG PO TABS: 4.0000 mg | ORAL_TABLET | Freq: Three times a day (TID) | ORAL | Status: DC | PRN

## 2015-08-08 MED ORDER — SIMVASTATIN 40 MG PO TABS: 40.0000 mg | ORAL_TABLET | Freq: Every day | ORAL | Status: DC

## 2015-08-08 NOTE — Progress Notes (Signed)
08/08/2015 2:58 PM  Reviewed discharge information with patient, including medications, follow-up appt information, diet instructions, etc.  Pt asked questions appropriately and verbalized understanding of information.  IV and telemetry removed.  Pt escorted out via wheelchair.  Princella Pellegrini

## 2015-08-08 NOTE — Care Management Note (Signed)
Case Management Note  Patient Details  Name: [name] MRN: [id number] Date of Birth: 07/27/1960  Subjective/Objective:       CM following for progression and d/c planning.             Action/Plan: 08/08/2015 Met with pt and family re [facility] , they selected [facility] for [facility] services. [place] notified. Pt appears to needs medication management.   Expected Discharge Date:    08/08/2015              Expected Discharge Plan:  [place]  In-House Referral:  NA  Discharge planning Services  CM Consult  Post Acute Care Choice:  Home Health Choice offered to:  Patient  DME Arranged:  N/A DME Agency:  NA  HH Arranged:  RN [place] Agency:  [place]  Status of Service:  Completed, signed off  Medicare Important Message Given:    Date Medicare IM Given:    Medicare IM give by:    Date Additional Medicare IM Given:    Additional Medicare Important Message give by:     If discussed at [place] of Stay Meetings, dates discussed:    Additional Comments:  Adron Bene, RN 08/08/2015, 2:15 PM

## 2015-08-08 NOTE — Progress Notes (Signed)
Family Medicine Teaching Service Daily Progress Note Intern Pager: [phone]  Patient name: [name] Medical record number: [id number] Date of birth: Jan 29, 1961 Age: 55 y.o. Gender: female  Primary Care Provider: Donnamae Jude, MD Consultants: None  Code Status: FULL   Pt Overview and Major Events to Date:  4/3: Patient admitted for nausea/vomiting/dehydration  4/4: Tolerating full diet without further emesis   Assessment and Plan: [name] is a 55 y.o. female presenting with nausea and vomiting . PMH is significant for T2DM, HTN, and chronic back pain.  Nausea / Vomiting / Dehydration. Viral gastoenteritis vs. Gastroparesis. Patient reports one similar episode of this last year, has otherwise been asymptomatic throughout the year. Denies taking Reglan (says she has no treatment for GI complications of DM) but this is on her home med list. Continues to have mild abdominal pain, likely 2/2 to retching. No emesis for over 24 hours.  Appears well hydrated with good PO intake.  - tolerating full diet  - Zofran 4mg  IV q6hrs prn  Chest Pain. Atypical in nature. Likely secondary to retching. Has since resolved. Troponins peaked at 0.06 and then down-trended to 0.05. EKG unremarkable.  -CVD risk score calculated to be about 37% 10 year risk; patient with simvastatin listed on home medication previously but she has stopped taking it, discussed importance of medication  -will give Rx for simvastatin at d/c   Hypokalemia. Repletion with Kdur 40 mEq x2 yesterday after K of 3.1.  -repeat BMET pending from today, will replete K prn   T2DM. Patient on metformin and lantus 20U daily at home. CBGs 254-320.  - increased lantus to 15 units this AM  - Sensitive SSI  - Hold home metformin  HTN. History of resistant hypertension. Was elevated to 210s/100s in ED. BPs now 101-110/48-55.  -Continue home coreg and clonidine -held home amlodipine and HCTZ on 4/4  -Holding home losartan.    Asymptomatic Bacteruria. Many bacteria noted on UA. No urinary symptoms. Patient given 1g of ceftriaxone in ED. - Hold further doses of antibiotics unless patient becomes symptomatic.   Chronic Pain. - Continue home tramadol and flexeril.   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 - CT angiography recommended for further evaluation on nonemergent basis, will put in d/c recommendations   FEN/GI: Carb Modified Diet, SLIV  Prophylaxis: subQ heparin  Disposition: Home likely today 4/4   Subjective:  Tolerating diet and drinking fluids. Some nausea, but no vomiting. Still some soreness in her abdomen. No chest pain.   Objective: Temp:  [97.8 F (36.6 C)-98.2 F (36.8 C)] 98.2 F (36.8 C) (04/04 0801) Pulse Rate:  [71-99] 71 (04/04 0801) Resp:  [17-20] 17 (04/04 0801) BP: (101-168)/(48-93) 110/48 mmHg (04/04 0801) SpO2:  [96 %-98 %] 96 % (04/04 0801) Weight:  [289 lb 11 oz (131.4 kg)] 289 lb 11 oz (131.4 kg) (04/03 2041) Physical Exam: General: female resting in bed in NAD, non-toxic  Cardiovascular: RRR. No murmurs appreciated.  Respiratory: CTAB. Normal WOB.  Abdomen: +BS, soft, ND, mildly TTP diffusely, no rebound or guarding.  Extremities: No LE edema.   Laboratory:  Recent Labs Lab 08/05/15 0440 08/06/15 1659 08/07/15 0406  WBC 14.1* 17.6* 14.7*  HGB 14.3 17.0* 15.6*  HCT 44.3 48.6* 48.3*  PLT 404* 568* 475*    Recent Labs Lab 08/05/15 0440 08/06/15 1659 08/07/15 0406  NA 138 134* 139  K 3.9 3.2* 3.1*  CL 101 97* 101  CO2 24 23 26  BUN 8 16 12   CREATININE 0.55 0.89 0.87  CALCIUM 9.7 9.7 9.2  PROT 9.2* 9.6* 8.0  BILITOT 0.4 0.8 0.6  ALKPHOS 132* 154* 135*  ALT 15 12* 16  AST 21 17 17   GLUCOSE 292* 363* 304*    Trop 0.06 >> 0.06 >> 0.05  Lactic acid 1.36  Imaging/Diagnostic Tests: Dg Chest 2 View  08/06/2015 CLINICAL DATA: Nausea and vomiting 3 days. Some chest pain and dizziness. EXAM: CHEST 2 VIEW COMPARISON:  08/05/2015 FINDINGS: Lungs are adequately inflated without focal consolidation or effusion. Cardiomediastinal silhouette is within normal. Bony structures are within normal. There are prominent overlying soft tissues. IMPRESSION: No active cardiopulmonary disease. Electronically Signed By: [name] M.D. On: 08/06/2015 16:52   EKG: Sinus tachycardia  Carolyn Bang, DO 08/08/2015, 9:10 AM PGY-1, [place] Intern pager: [phone], text pages welcome

## 2015-08-08 NOTE — Discharge Summary (Signed)
[facility] Discharge Summary  Patient name: [name] Medical record number: [id number] Date of birth: 01-Mar-1961 Age: 55 y.o. Gender: female Date of Admission: 08/06/2015  Date of Discharge: 08/08/2015 Admitting Physician: Carolyn Feil, MD  Primary Care Provider: Donnamae Jude, MD Consultants: None   Indication for Hospitalization: Dehydration 2/2 to Emesis   Discharge Diagnoses/Problem List:  Patient Active Problem List   Diagnosis Date Noted  . Accelerated hypertension   . Type 2 diabetes mellitus without complication, without long-term current use of insulin ([place])   . Uncontrollable vomiting   . Dehydration 08/06/2015  . Low back pain 06/09/2014  . Poorly controlled type 2 diabetes mellitus ([place]) 05/11/2014  . Obstructive sleep apnea of adult 01/16/2012  . GERD (gastroesophageal reflux disease) 01/16/2012  . Osteoarthritis of both knees 01/16/2012  . Hypercholesteremia 07/18/2011  . Diabetes mellitus without complication ([place]) [id number]  . LEIOMYOMA, UTERUS 01/14/2007  . OBESITY, NOS 07/03/2006  . Former smoker 07/03/2006  . Tension headache 07/03/2006  . HYPERTENSION, BENIGN SYSTEMIC 07/03/2006    Disposition: Home with [facility] RN   Discharge Condition: Improved   Discharge Exam:  General: female resting in bed in NAD, non-toxic  Cardiovascular: RRR. No murmurs appreciated.  Respiratory: CTAB. Normal WOB.  Abdomen: +BS, soft, ND, mildly TTP diffusely, no rebound or guarding.  Extremities: No LE edema.   Brief Hospital Course:  [name] is 55 y.o. female with PMH of T2DM, HTN, and chronic back pain who presented with nausea/vomiting/dehydration.   Nausea/Vomiting: IVF resuscitation performed during hospitalization and anti-emetics given. Patient without emesis since admission and diet advanced to full, which she was tolerating well at discharge. Abdominal exam was benign and abdominal pain seemed consistent with retching. Would  consider diabetic gastroparesis as cause. Patient reports one similar episode one year ago, which she reports she was told was related to her DM. Reglan is one her medication list in the EMR, however she denies taking this and says she was never given any type of treatment for this condition.   Chest Pain: Patient reported some atypical substernal chest pain related to emesis. ACS rule out was performed given heart score. Troponin peaked at 0.06 and then down-trended to 0.05. EKG was without acute changes. Chest pain resolved and was likely esophageal discomfort from recurrent vomiting.   HTN: Patient with h/o of resistant HTN. BPs were initially elevated to 210s/100s. Patient reported inability to take medications for several days due to vomiting. Was restarted on home amlodipine, coreg, Clonidine, and HCTZ. Losartan was held. However, BPs then became very soft to 100s/50s. HCTZ and Amlodipine were then held. Concern that patient does not actually take all of her medications at home given sudden decrease in BP after receiving home medications.   Issues for Follow Up:  1. Blood Pressure: Patient discharged with Losartan decreased to 25 mg daily (from 50 mg) and Coreg 25 mg BID. HCTZ, Amlodipine and Clonidine held at discharge. Home Health RN ordered for BP checks. Increase losartan and/or add back additional anti-hypertensive agents as needed.  2. CT renal stone study from prior ED visit on 4/1 showed a probable 12 mm calcified left renal artery aneurysm. CT angiography on non-emergent basis recommended for further evaluation.  3. CVD 10 year risk score calculated to be approximately 37%. Discussed importance of taking statin to lower risk. Patient previously on Simvastatin but self discontinued this medication at home. Rx for Simvastatin given at discharge. 4. Consider further work up for diabetic  gastroparesis in the future.    Significant Procedures: None   Significant Labs and Imaging:   Recent  Labs Lab 08/05/15 0440 08/06/15 1659 08/07/15 0406  WBC 14.1* 17.6* 14.7*  HGB 14.3 17.0* 15.6*  HCT 44.3 48.6* 48.3*  PLT 404* 568* 475*    Recent Labs Lab 08/05/15 0440 08/06/15 1659 08/07/15 0406 08/08/15 0851  NA 138 134* 139 138  K 3.9 3.2* 3.1* 3.6  CL 101 97* 101 103  CO2 24 23 26 26   GLUCOSE 292* 363* 304* 228*  BUN 8 16 12  21*  CREATININE 0.55 0.89 0.87 1.06*  CALCIUM 9.7 9.7 9.2 8.8*  ALKPHOS 132* 154* 135*  --   AST 21 17 17   --   ALT 15 12* 16  --   ALBUMIN 4.0 3.8 2.9*  --    Dg Chest 2 View  08/06/2015 CLINICAL DATA: Nausea and vomiting 3 days. Some chest pain and dizziness. EXAM: CHEST 2 VIEW COMPARISON: 08/05/2015 FINDINGS: Lungs are adequately inflated without focal consolidation or effusion. Cardiomediastinal silhouette is within normal. Bony structures are within normal. There are prominent overlying soft tissues. IMPRESSION: No active cardiopulmonary disease. Electronically Signed By: [name] M.D. On: 08/06/2015 16:52   Results/Tests Pending at Time of Discharge: Hemoglobin A1C   Discharge Medications:    Medication List    STOP taking these medications        amLODipine 10 MG tablet  Commonly known as:  NORVASC     cloNIDine 0.3 MG tablet  Commonly known as:  CATAPRES     hydrochlorothiazide 25 MG tablet  Commonly known as:  HYDRODIURIL     metoCLOPramide 10 MG tablet  Commonly known as:  REGLAN      TAKE these medications        accu-chek soft touch lancets  TAKE AS DIRECTED.     aspirin 325 MG tablet  Take 1 tablet (325 mg total) by mouth daily.     carvedilol 25 MG tablet  Commonly known as:  COREG  TAKE ONE TABLET BY MOUTH TWICE DAILY WITH MEALS     cyclobenzaprine 10 MG tablet  Commonly known as:  FLEXERIL  TAKE ONE TABLET BY MOUTH EVERY 8 HOURS AS NEEDED FOR MUSCLE SPASM     EPINEPHrine 0.3 mg/0.3 mL Soaj injection  Commonly known as:  EPIPEN  Inject 0.3 mLs (0.3 mg total) into the muscle once.     EXEL  COMFORT POINT PEN NEEDLE 29G X 12MM Misc  Generic drug:  Insulin Pen Needle  AS DIRECTED     Insulin Syringe-Needle U-100 28G X 1/2" 0.5 ML Misc  1 Units by Does not apply route as directed.     LANTUS 100 UNIT/ML injection  Generic drug:  insulin glargine  INJECT 20 UNITS INTO THE SKIN DAILY     losartan 50 MG tablet  Commonly known as:  COZAAR  Take 0.5 tablets (25 mg total) by mouth daily.     metFORMIN 500 MG tablet  Commonly known as:  GLUCOPHAGE  Take 1 tablet (500 mg total) by mouth 2 (two) times daily with a meal.     ondansetron 4 MG tablet  Commonly known as:  ZOFRAN  Take 1 tablet (4 mg total) by mouth every 8 (eight) hours as needed for nausea or vomiting.     promethazine 25 MG tablet  Commonly known as:  PHENERGAN  TAKE ONE-HALF TO ONE TABLET BY MOUTH 4 TIMES DAILY     simvastatin 40  MG tablet  Commonly known as:  ZOCOR  Take 1 tablet (40 mg total) by mouth daily.     traMADol 50 MG tablet  Commonly known as:  ULTRAM  Take 1 tablet (50 mg total) by mouth every 6 (six) hours as needed for moderate pain.        Discharge Instructions: Please refer to Patient Instructions section of EMR for full details.  Patient was counseled important signs and symptoms that should prompt return to medical care, changes in medications, dietary instructions, activity restrictions, and follow up appointments.   Follow-Up Appointments: Follow-up Information    Follow up with [facility]. Go on 08/11/2015.   Specialty:  Family Medicine   Why:  For Hospital Followup at 3:30 pm with Dr. Lilyan Punt information:   [address] [id number] [place] [id number] [place], DO 08/08/2015, 3:10 PM PGY-1, [place]

## 2015-08-08 NOTE — Discharge Instructions (Signed)
You were hospitalized for dehydration due to nausea and vomiting. Take Zofran as needed for the next few days for nausea. Eat small meals and eat bland foods. Make sure you are drinking plenty of water.   Your blood pressure was initially very high but got low when we restarted your home medications. You should continue taking your Coreg and we have sent a new prescription for a lower dose Losartan. Do not take your Clonidine, HCTZ, or Amlodipine. Home Health Nurses will come check your Blood pressure at home and help with medication management. Also, it is important you go to your scheduled follow up appointment.   We have sent Simvastatin to your pharmacy to help lower cholesterol.

## 2015-08-09 ENCOUNTER — Encounter (HOSPITAL_COMMUNITY): Payer: Self-pay

## 2015-08-09 ENCOUNTER — Emergency Department (HOSPITAL_COMMUNITY): Payer: Medicaid Other

## 2015-08-09 ENCOUNTER — Inpatient Hospital Stay (HOSPITAL_COMMUNITY)
Admission: EM | Admit: 2015-08-09 | Discharge: 2015-08-13 | DRG: 074 | Disposition: A | Discharge status: Home Health | Payer: Medicaid Other | Attending: Family Medicine | Admitting: Family Medicine

## 2015-08-09 DIAGNOSIS — E876 Hypokalemia: Secondary | ICD-10-CM | POA: Diagnosis present

## 2015-08-09 DIAGNOSIS — G4733 Obstructive sleep apnea (adult) (pediatric): Secondary | ICD-10-CM | POA: Diagnosis present

## 2015-08-09 DIAGNOSIS — R1110 Vomiting, unspecified: Secondary | ICD-10-CM

## 2015-08-09 DIAGNOSIS — I722 Aneurysm of renal artery: Secondary | ICD-10-CM | POA: Diagnosis not present

## 2015-08-09 DIAGNOSIS — Z7982 Long term (current) use of aspirin: Secondary | ICD-10-CM | POA: Diagnosis not present

## 2015-08-09 DIAGNOSIS — Z6835 Body mass index (BMI) 35.0-35.9, adult: Secondary | ICD-10-CM

## 2015-08-09 DIAGNOSIS — G43A1 Cyclical vomiting, intractable: Secondary | ICD-10-CM | POA: Diagnosis not present

## 2015-08-09 DIAGNOSIS — K219 Gastro-esophageal reflux disease without esophagitis: Secondary | ICD-10-CM | POA: Diagnosis present

## 2015-08-09 DIAGNOSIS — Z888 Allergy status to other drugs, medicaments and biological substances status: Secondary | ICD-10-CM

## 2015-08-09 DIAGNOSIS — K3184 Gastroparesis: Secondary | ICD-10-CM | POA: Diagnosis present

## 2015-08-09 DIAGNOSIS — D751 Secondary polycythemia: Secondary | ICD-10-CM | POA: Diagnosis present

## 2015-08-09 DIAGNOSIS — E860 Dehydration: Secondary | ICD-10-CM | POA: Diagnosis present

## 2015-08-09 DIAGNOSIS — R1115 Cyclical vomiting syndrome unrelated to migraine: Secondary | ICD-10-CM | POA: Diagnosis present

## 2015-08-09 DIAGNOSIS — E1143 Type 2 diabetes mellitus with diabetic autonomic (poly)neuropathy: Secondary | ICD-10-CM | POA: Diagnosis not present

## 2015-08-09 DIAGNOSIS — E118 Type 2 diabetes mellitus with unspecified complications: Secondary | ICD-10-CM | POA: Diagnosis not present

## 2015-08-09 DIAGNOSIS — Z9114 Patient's other noncompliance with medication regimen: Secondary | ICD-10-CM | POA: Diagnosis not present

## 2015-08-09 DIAGNOSIS — E1165 Type 2 diabetes mellitus with hyperglycemia: Secondary | ICD-10-CM | POA: Diagnosis present

## 2015-08-09 DIAGNOSIS — R1084 Generalized abdominal pain: Secondary | ICD-10-CM | POA: Diagnosis present

## 2015-08-09 DIAGNOSIS — Z79899 Other long term (current) drug therapy: Secondary | ICD-10-CM

## 2015-08-09 DIAGNOSIS — K5900 Constipation, unspecified: Secondary | ICD-10-CM | POA: Diagnosis not present

## 2015-08-09 DIAGNOSIS — M170 Bilateral primary osteoarthritis of knee: Secondary | ICD-10-CM | POA: Diagnosis present

## 2015-08-09 DIAGNOSIS — D7589 Other specified diseases of blood and blood-forming organs: Secondary | ICD-10-CM | POA: Diagnosis present

## 2015-08-09 DIAGNOSIS — Z87891 Personal history of nicotine dependence: Secondary | ICD-10-CM | POA: Diagnosis not present

## 2015-08-09 DIAGNOSIS — Z9049 Acquired absence of other specified parts of digestive tract: Secondary | ICD-10-CM | POA: Diagnosis not present

## 2015-08-09 DIAGNOSIS — Z09 Encounter for follow-up examination after completed treatment for conditions other than malignant neoplasm: Secondary | ICD-10-CM | POA: Diagnosis not present

## 2015-08-09 DIAGNOSIS — I10 Essential (primary) hypertension: Secondary | ICD-10-CM | POA: Diagnosis not present

## 2015-08-09 DIAGNOSIS — E785 Hyperlipidemia, unspecified: Secondary | ICD-10-CM | POA: Diagnosis present

## 2015-08-09 DIAGNOSIS — E108 Type 1 diabetes mellitus with unspecified complications: Secondary | ICD-10-CM

## 2015-08-09 DIAGNOSIS — IMO0002 Reserved for concepts with insufficient information to code with codable children: Secondary | ICD-10-CM | POA: Diagnosis present

## 2015-08-09 DIAGNOSIS — Z794 Long term (current) use of insulin: Secondary | ICD-10-CM | POA: Diagnosis not present

## 2015-08-09 DIAGNOSIS — E1065 Type 1 diabetes mellitus with hyperglycemia: Secondary | ICD-10-CM | POA: Diagnosis present

## 2015-08-09 LAB — CBC WITH DIFFERENTIAL/PLATELET
Basophils Absolute: 0 10*3/uL (ref 0.0–0.1)
Basophils Relative: 0 %
EOS PCT: 0 %
Eosinophils Absolute: 0 10*3/uL (ref 0.0–0.7)
HCT: 49.2 % — ABNORMAL HIGH (ref 36.0–46.0)
Hemoglobin: 15.9 g/dL — ABNORMAL HIGH (ref 12.0–15.0)
LYMPHS ABS: 1.4 10*3/uL (ref 0.7–4.0)
LYMPHS PCT: 9 %
MCH: 25.1 pg — ABNORMAL LOW (ref 26.0–34.0)
MCHC: 32.3 g/dL (ref 30.0–36.0)
MCV: 77.7 fL — AB (ref 78.0–100.0)
MONO ABS: 0.3 10*3/uL (ref 0.1–1.0)
MONOS PCT: 2 %
Neutro Abs: 13.3 10*3/uL — ABNORMAL HIGH (ref 1.7–7.7)
Neutrophils Relative %: 89 %
PLATELETS: 434 10*3/uL — AB (ref 150–400)
RBC: 6.33 MIL/uL — AB (ref 3.87–5.11)
RDW: 14.4 % (ref 11.5–15.5)
WBC: 15.1 10*3/uL — ABNORMAL HIGH (ref 4.0–10.5)

## 2015-08-09 LAB — COMPREHENSIVE METABOLIC PANEL
ALBUMIN: 3.4 g/dL — AB (ref 3.5–5.0)
ALT: 17 U/L (ref 14–54)
AST: 20 U/L (ref 15–41)
Alkaline Phosphatase: 129 U/L — ABNORMAL HIGH (ref 38–126)
Anion gap: 16 — ABNORMAL HIGH (ref 5–15)
BILIRUBIN TOTAL: 0.5 mg/dL (ref 0.3–1.2)
BUN: 6 mg/dL (ref 6–20)
CHLORIDE: 101 mmol/L (ref 101–111)
CO2: 21 mmol/L — AB (ref 22–32)
Calcium: 9.3 mg/dL (ref 8.9–10.3)
Creatinine, Ser: 0.71 mg/dL (ref 0.44–1.00)
GFR calc Af Amer: >60 mL/min (ref >60)
GFR calc non Af Amer: >60 mL/min (ref >60)
GLUCOSE: 236 mg/dL — AB (ref 65–99)
POTASSIUM: 3.3 mmol/L — AB (ref 3.5–5.1)
SODIUM: 138 mmol/L (ref 135–145)
TOTAL PROTEIN: 8.8 g/dL — AB (ref 6.5–8.1)

## 2015-08-09 LAB — LIPASE, BLOOD: Lipase: 30 U/L (ref 11–51)

## 2015-08-09 LAB — GLUCOSE, CAPILLARY: Glucose-Capillary: 269 mg/dL — ABNORMAL HIGH (ref 65–99)

## 2015-08-09 LAB — I-STAT TROPONIN, ED: Troponin i, poc: 0.05 ng/mL (ref 0.00–0.08)

## 2015-08-09 LAB — CBG MONITORING, ED: GLUCOSE-CAPILLARY: 202 mg/dL — AB (ref 65–99)

## 2015-08-09 IMAGING — CT CT ABD-PELV W/ CM
2 of 5 series · 8 of 46 positions shown, 9 images · IV contrast (iopamidol)
Comparison: None.

CLINICAL DATA: Generalized abdominal pain with nausea and vomiting
for 4 days.

EXAM:
CT ABDOMEN AND PELVIS WITH CONTRAST
TECHNIQUE: Multidetector CT imaging of the abdomen and pelvis was performed
using the standard protocol following bolus administration of
intravenous contrast.
CONTRAST:  100mL [6S] IOPAMIDOL ([6S]) INJECTION 61%

[Series 201: routine, idose (2) · axial · 0.93mm/px · z∈[+88,+498]mm · 5 of 104 slices shown, 6 images]
[im 11/104  soft-tissue]
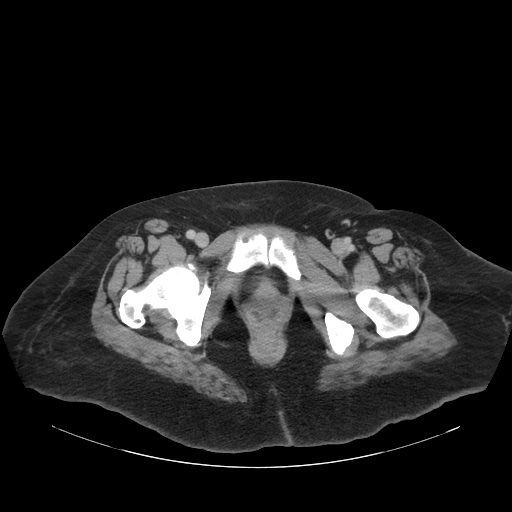
[im 11/104  bone]
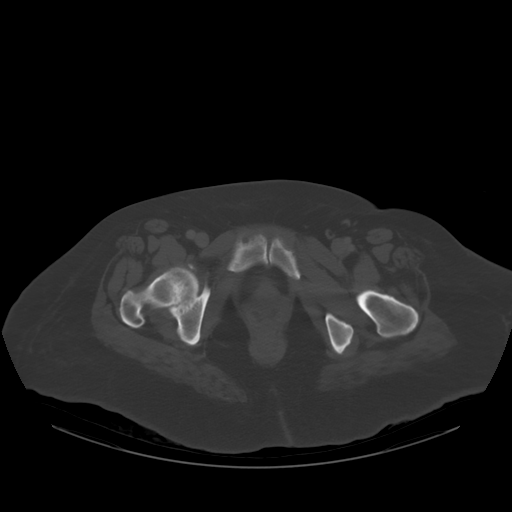
[im 33/104  soft-tissue]
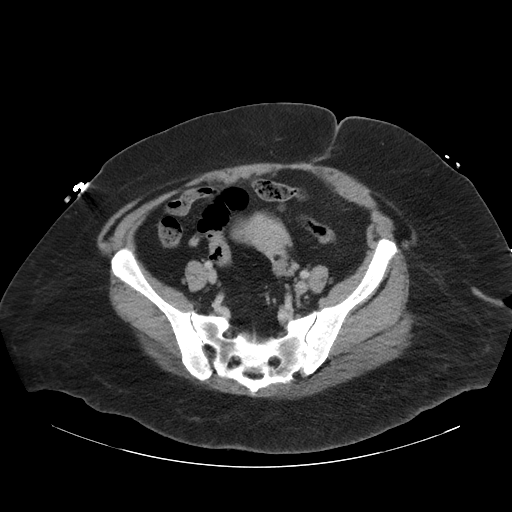
[im 55/104  soft-tissue]
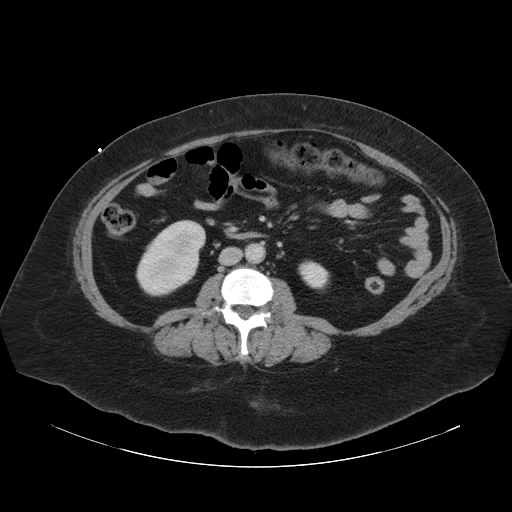
[im 71/104  soft-tissue]
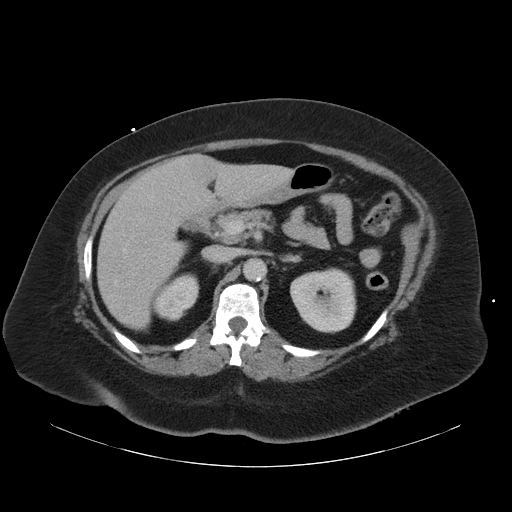
[im 93/104  soft-tissue]
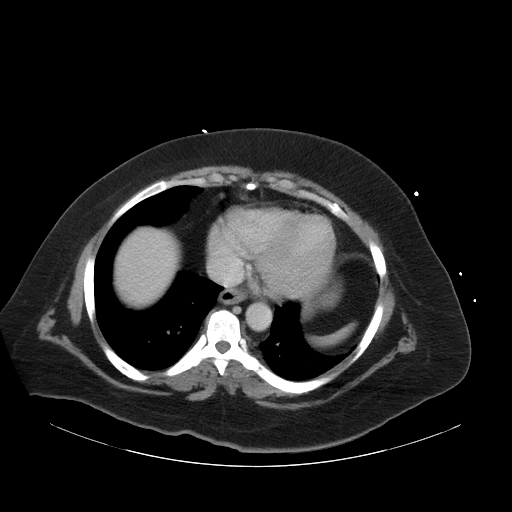

[Series 203: coronals, idose (2) · coronal · 0.45mm/px · 3 of 147 slices shown]
[im 49/147  soft-tissue]
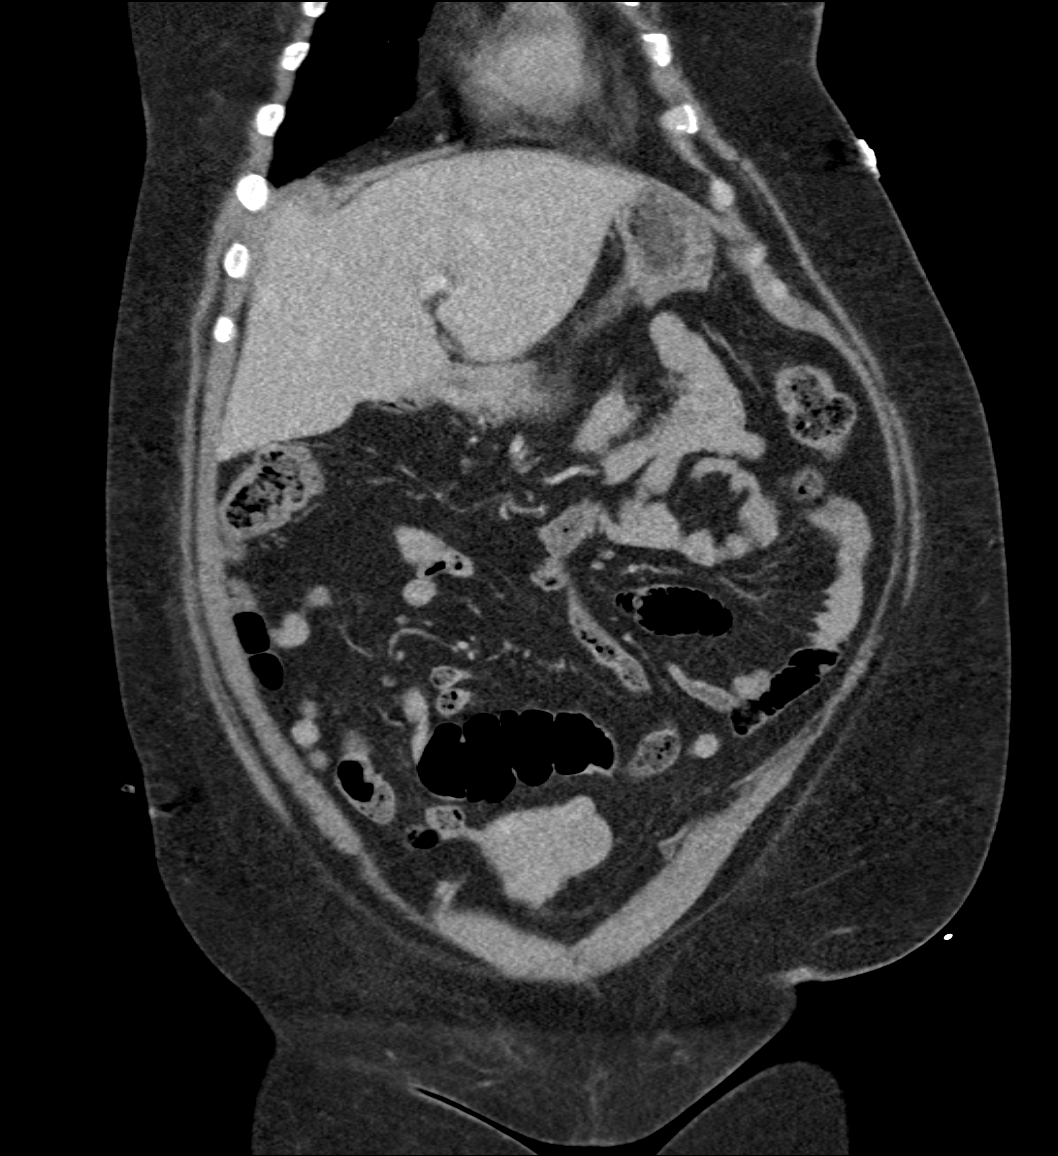
[im 65/147  soft-tissue]
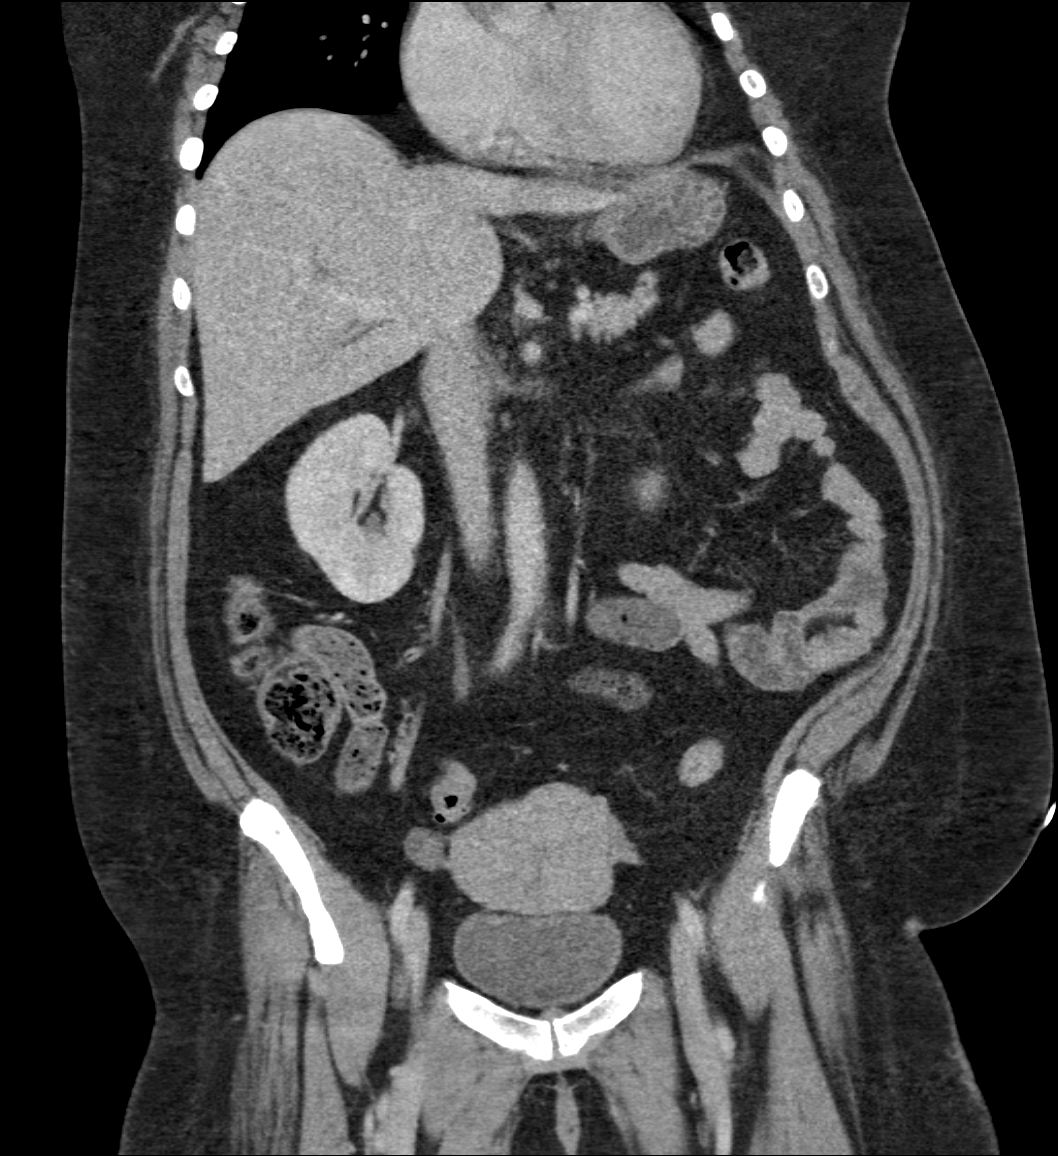
[im 82/147  soft-tissue]
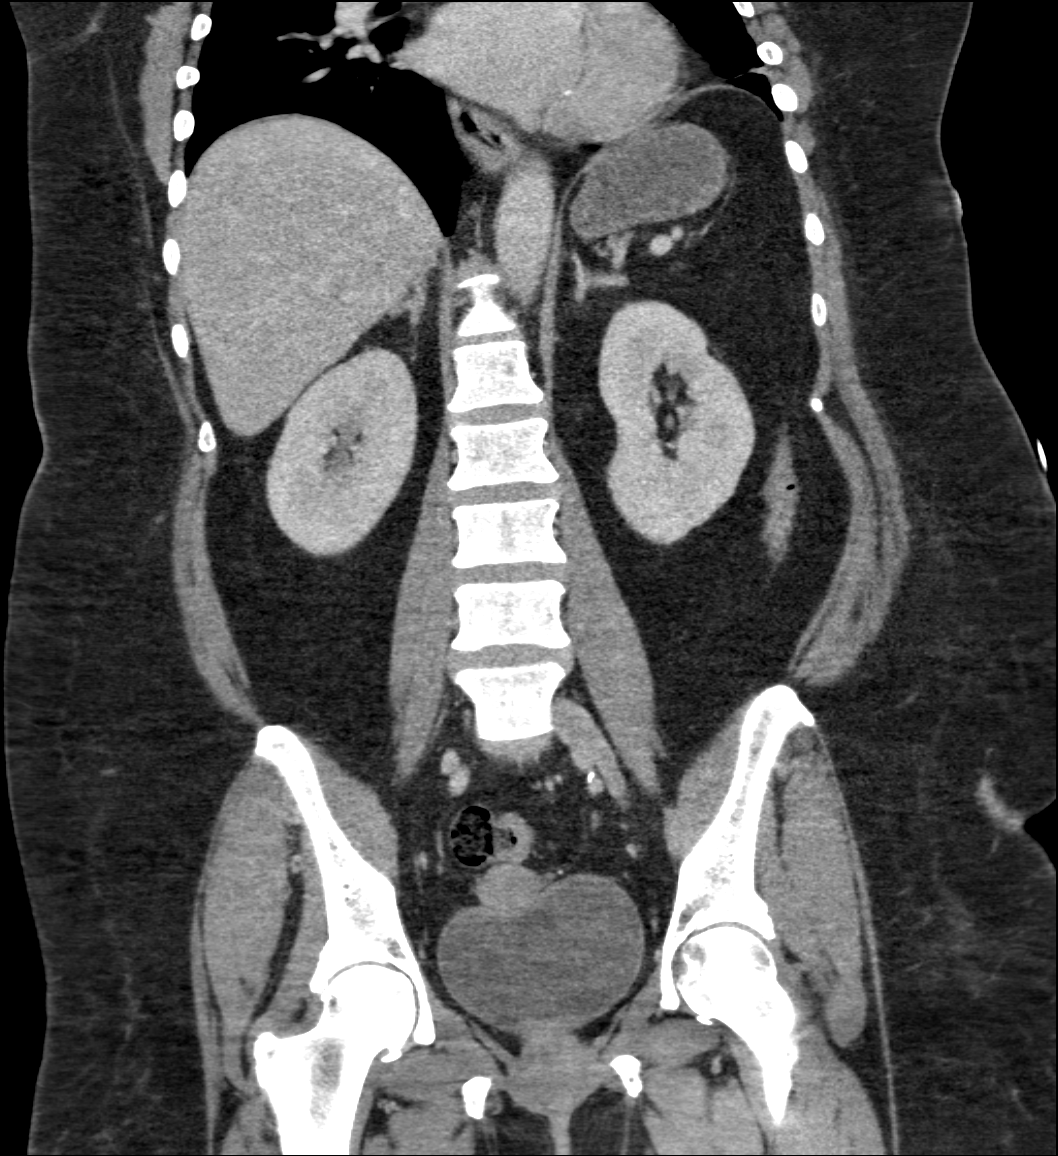

[8 of 46 positions shown; findings below may reference images not displayed]

FINDINGS: Lower chest:  No acute findings.

Hepatobiliary: No masses or other significant abnormality. Prior
cholecystectomy noted. No evidence of biliary dilatation.

Pancreas: No mass, inflammatory changes, or other significant
abnormality.

Spleen: Within normal limits in size and appearance.

Adrenals/Urinary Tract: No masses identified. Tiny bilateral renal
cysts noted. No evidence of hydronephrosis.

Stomach/Bowel: No evidence of obstruction, inflammatory process, or
abnormal fluid collections. Normal appendix visualized.

Vascular/Lymphatic: No pathologically enlarged lymph nodes. No
evidence of abdominal aortic aneurysm. 11 mm peripherally calcified
left renal artery aneurysm remains stable.

Reproductive: Mildly enlarged uterus with several small fibroids
shows no significant change. No adnexal mass or free fluid
identified. A small amount of fluid and gas is seen within the
vagina on today's study, which is of uncertain etiology and clinical
significance.

Other: A small periumbilical ventral hernia containing only fat is
unchanged.

Musculoskeletal:  No suspicious bone lesions identified.
IMPRESSION: Small amount of fluid and gas seen within the vagina, which is of
uncertain etiology and clinical significance. Clinical correlation
is recommended.

Stable mildly enlarged fibroid uterus.

Stable 11 mm calcified renal artery aneurysm.

Stable small periumbilical ventral hernia containing only fat.

## 2015-08-09 MED ORDER — INSULIN GLARGINE 100 UNIT/ML ~~LOC~~ SOLN: 10.0000 [IU] | Freq: Every day | SUBCUTANEOUS | Status: DC

## 2015-08-09 MED ORDER — IOPAMIDOL (ISOVUE-300) INJECTION 61%
INTRAVENOUS | Status: AC
  Filled 2015-08-09: qty 100

## 2015-08-09 MED ORDER — METOCLOPRAMIDE HCL 5 MG/ML IJ SOLN
10.0000 mg | Freq: Once | INTRAMUSCULAR | Status: AC
  Administered 2015-08-09: 10 mg via INTRAVENOUS
  Filled 2015-08-09: qty 2

## 2015-08-09 MED ORDER — INSULIN GLARGINE 100 UNIT/ML ~~LOC~~ SOLN
10.0000 [IU] | Freq: Every day | SUBCUTANEOUS | Status: DC
  Administered 2015-08-10: 10 [IU] via SUBCUTANEOUS
  Filled 2015-08-09 (×2): qty 0.1

## 2015-08-09 MED ORDER — INSULIN ASPART 100 UNIT/ML ~~LOC~~ SOLN
0.0000 [IU] | Freq: Every day | SUBCUTANEOUS | Status: DC
  Administered 2015-08-09: 3 [IU] via SUBCUTANEOUS
  Administered 2015-08-10 – 2015-08-11 (×2): 2 [IU] via SUBCUTANEOUS

## 2015-08-09 MED ORDER — ONDANSETRON 4 MG PO TBDP
4.0000 mg | ORAL_TABLET | Freq: Three times a day (TID) | ORAL | Status: DC | PRN
  Administered 2015-08-11 – 2015-08-13 (×2): 4 mg via ORAL
  Filled 2015-08-09 (×2): qty 1

## 2015-08-09 MED ORDER — ONDANSETRON HCL 4 MG/2ML IJ SOLN
4.0000 mg | Freq: Once | INTRAMUSCULAR | Status: AC
  Administered 2015-08-09: 4 mg via INTRAVENOUS
  Filled 2015-08-09: qty 2

## 2015-08-09 MED ORDER — IOPAMIDOL (ISOVUE-300) INJECTION 61%
100.0000 mL | Freq: Once | INTRAVENOUS | Status: AC | PRN
Start: 2015-08-09 — End: 2015-08-09
  Administered 2015-08-09: 100 mL via INTRAVENOUS

## 2015-08-09 MED ORDER — LABETALOL HCL 5 MG/ML IV SOLN
20.0000 mg | Freq: Once | INTRAVENOUS | Status: AC
  Administered 2015-08-09: 20 mg via INTRAVENOUS
  Filled 2015-08-09: qty 4

## 2015-08-09 MED ORDER — SIMVASTATIN 40 MG PO TABS
40.0000 mg | ORAL_TABLET | Freq: Every day | ORAL | Status: DC
  Administered 2015-08-10: 40 mg via ORAL
  Filled 2015-08-09: qty 1

## 2015-08-09 MED ORDER — HYDRALAZINE HCL 20 MG/ML IJ SOLN
5.0000 mg | Freq: Once | INTRAMUSCULAR | Status: AC
  Administered 2015-08-09: 5 mg via INTRAVENOUS
  Filled 2015-08-09: qty 1

## 2015-08-09 MED ORDER — INSULIN ASPART 100 UNIT/ML ~~LOC~~ SOLN
0.0000 [IU] | Freq: Three times a day (TID) | SUBCUTANEOUS | Status: DC
  Administered 2015-08-10: 5 [IU] via SUBCUTANEOUS
  Administered 2015-08-10: 8 [IU] via SUBCUTANEOUS
  Administered 2015-08-10: 5 [IU] via SUBCUTANEOUS
  Administered 2015-08-11: 8 [IU] via SUBCUTANEOUS
  Administered 2015-08-11 (×2): 5 [IU] via SUBCUTANEOUS
  Administered 2015-08-12: 3 [IU] via SUBCUTANEOUS
  Administered 2015-08-12: 11 [IU] via SUBCUTANEOUS
  Administered 2015-08-12: 3 [IU] via SUBCUTANEOUS
  Administered 2015-08-13: 5 [IU] via SUBCUTANEOUS
  Administered 2015-08-13: 3 [IU] via SUBCUTANEOUS

## 2015-08-09 MED ORDER — MORPHINE SULFATE (PF) 2 MG/ML IV SOLN
2.0000 mg | INTRAVENOUS | Status: DC | PRN
  Administered 2015-08-10 – 2015-08-11 (×4): 2 mg via INTRAVENOUS
  Filled 2015-08-09 (×4): qty 1

## 2015-08-09 MED ORDER — MORPHINE SULFATE (PF) 2 MG/ML IV SOLN
2.0000 mg | Freq: Once | INTRAVENOUS | Status: AC
  Administered 2015-08-09: 2 mg via INTRAVENOUS
  Filled 2015-08-09: qty 1

## 2015-08-09 MED ORDER — MORPHINE SULFATE (PF) 4 MG/ML IV SOLN
4.0000 mg | Freq: Once | INTRAVENOUS | Status: AC
  Administered 2015-08-09: 4 mg via INTRAVENOUS
  Filled 2015-08-09: qty 1

## 2015-08-09 MED ORDER — LOSARTAN POTASSIUM 25 MG PO TABS
25.0000 mg | ORAL_TABLET | Freq: Every day | ORAL | Status: DC
Start: 2015-08-10 — End: 2015-08-13
  Administered 2015-08-10 – 2015-08-13 (×4): 25 mg via ORAL
  Filled 2015-08-09 (×4): qty 1

## 2015-08-09 MED ORDER — CARVEDILOL 25 MG PO TABS
25.0000 mg | ORAL_TABLET | Freq: Two times a day (BID) | ORAL | Status: DC
  Administered 2015-08-10 – 2015-08-13 (×7): 25 mg via ORAL
  Filled 2015-08-09 (×7): qty 1

## 2015-08-09 MED ORDER — SODIUM CHLORIDE 0.9 % IV SOLN
INTRAVENOUS | Status: AC
  Administered 2015-08-09 – 2015-08-10 (×2): via INTRAVENOUS

## 2015-08-09 MED ORDER — ENOXAPARIN SODIUM 40 MG/0.4ML ~~LOC~~ SOLN
40.0000 mg | Freq: Every day | SUBCUTANEOUS | Status: DC
  Administered 2015-08-10 – 2015-08-12 (×3): 40 mg via SUBCUTANEOUS
  Filled 2015-08-09 (×4): qty 0.4

## 2015-08-09 MED ORDER — SODIUM CHLORIDE 0.9 % IV BOLUS (SEPSIS)
1000.0000 mL | Freq: Once | INTRAVENOUS | Status: AC
  Administered 2015-08-09: 1000 mL via INTRAVENOUS

## 2015-08-09 NOTE — H&P (Signed)
[facility] Admission History and Physical Service Pager: [phone]  Patient name: [name] Medical record number: [id number] Date of birth: Sep 15, 1960 Age: 55 y.o. Gender: female  Primary Care Provider: Donnamae Jude, MD Consultants: none Code Status: FULL  Chief Complaint: vomiting, abdominal pain  Assessment and Plan: [name] is a 55 y.o. female presenting with emesis and abdominal pain. PMH is significant for Type II DM, HTN, GERD, obesity.  Abdominal pain, vomiting: Diabetic gastroparesis vs viral gastroenteritis. Patient recently admitted with same symptoms which improved after IVF and anti-emetics, however symptoms returned less than one day after discharge. Patient afebrile and no symptoms other than vomiting. Electrolytes stable.  Patient does not appear dehydrated on admission (moist mucous membranes), though is slightly tachycardic. Endorses diffuse abdominal tenderness to palpation, which could be secondary to gastroparesis or retching. CT stable with no acute pathology- notes gas and fluid in the vagina which is changed from previous, however patient denies GU symptoms.  - Admit to inpatient unit, attending Nori Riis - Clear liquid diet - NS@150  mL/hr x 12hrs  - Morphine 2mg  q4hr PRN  - Zofran ODT 4mg  q8hr PRN  - AM BMP, CBC - lactic acid ordered, however patient history and exam not consistent with a mesenteric ischemia.  - Can consider Reglan for gastroparesis treatment if not improving - Consider gastric emptying study for further gastroparesis work-up  Type II DM: Hyperglycemic on admission (CBG 202-236). On metformin and Lantus 20U at home. Last A1C 10.4 (two days prior to this admission). Last A1c 10.4  - Hold metformin - Moderate SSI - Lantus 10U (half home dose) - CBG qACHS  HTN: BP 233/85 in ED. On losartan 25mg  (recently decreased from 50mg ), Coreg 25mg  BID. Formerly on HCTZ, amlodipine, and clonidine as well, though these  medications were discontinued on discharge yesterday. Concern that patient is non-compliant with meds, and as such did not need as many anti-hypertensives. Received IV labetalol and hydralazine in ED given elevated systolic and inability to tolerate PO.  - Continue losartan and Coreg - add back amlodipine  - continue to monitor, would like to avoid clonidine if possible in this patient with concerns for non compliance.   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  - Outpatient CT angiography recommended for further evaluation  FEN/GI: clear liquid diet, NS@150  mL/hr x12hrs  Prophylaxis: lovenox  Disposition: admit to inpatient unit  History of Present Illness:  [name] is a 55 y.o. female presenting with emesis and abdominal pain.  Patient was discharged yesterday after a two day admission for similar complaints (emesis with dehydration). Diabetic gastroparesis was thought to be cause, and patient improved with IVF resuscitation and anti-emetics. This afternoon, patient began experiencing centrally-located abdominal pain, nausea, and vomiting again. She subsequently presented to the ED.      She denies hematemesis- emesis was clear/yellow. Denies diarrhea and constipation, with last reported BM earlier today. She started having fevers (Tmax 101F) and chills while in the ED. She also reported a HA for most of the day today, but denies blurred vision. She reports taking all her medications last night, but vomited immediately after taking her meds this AM. CBGs since hospital discharge yesterday have been in low 200s.    In ED, she was given Reglan, Zofran, and morphine, with no improvement in symptoms. She was also found to be hypertensive, with BP 233/85 with a temperature of 101F.   Review Of Systems: Per HPI  with the following additions: none. Otherwise the remainder of the systems were negative.  Patient Active Problem List   Diagnosis Date Noted  .  Diabetic gastroparesis ([place]) 08/09/2015  . Emesis, persistent 08/09/2015  . Accelerated hypertension   . Type 2 diabetes mellitus without complication, without long-term current use of insulin ([place])   . Uncontrollable vomiting   . Dehydration 08/06/2015  . Low back pain 06/09/2014  . Poorly controlled type 2 diabetes mellitus ([place]) 05/11/2014  . Obstructive sleep apnea of adult 01/16/2012  . GERD (gastroesophageal reflux disease) 01/16/2012  . Osteoarthritis of both knees 01/16/2012  . Hypercholesteremia 07/18/2011  . Diabetes mellitus without complication ([place]) [id number]  . LEIOMYOMA, UTERUS 01/14/2007  . OBESITY, NOS 07/03/2006  . Former smoker 07/03/2006  . Tension headache 07/03/2006  . HYPERTENSION, BENIGN SYSTEMIC 07/03/2006    Past Medical History: Past Medical History  Diagnosis Date  . Diabetes mellitus   . Hypertension   . GERD (gastroesophageal reflux disease)   . Nausea   . Hyperlipidemia     Past Surgical History: Past Surgical History  Procedure Laterality Date  . Breast surgery      reduction  . Cesarean section      x2  . Cholecystectomy      laparoscopic    Social History: Social History  Substance Use Topics  . Smoking status: Former Smoker -- 10 years    Types: Cigarettes    Quit date: 10/16/2011  . Smokeless tobacco: Never Used     Comment: 1 pack will last a week  . Alcohol Use: No   Please also refer to relevant sections of EMR.  Family History: Family History  Problem Relation Age of Onset  . Hypertension Mother   . Diabetes Mother   . Cancer Father     lung    Allergies and Medications: Allergies  Allergen Reactions  . Lisinopril Other (See Comments)    REACTION: angioedema   No current facility-administered medications on file prior to encounter.   Current Outpatient Prescriptions on File Prior to Encounter  Medication Sig Dispense Refill  . aspirin 325 MG tablet Take 1 tablet (325 mg total) by mouth daily. 30 tablet 5   . carvedilol (COREG) 25 MG tablet TAKE ONE TABLET BY MOUTH TWICE DAILY WITH MEALS (Patient taking differently: TAKE 25 MG BY MOUTH TWICE DAILY WITH MEALS) 60 tablet 5  . cyclobenzaprine (FLEXERIL) 10 MG tablet TAKE ONE TABLET BY MOUTH EVERY 8 HOURS AS NEEDED FOR MUSCLE SPASM 30 tablet 0  . EPINEPHrine (EPIPEN) 0.3 mg/0.3 mL SOAJ injection Inject 0.3 mLs (0.3 mg total) into the muscle once. (Patient not taking: Reported on 08/05/2015) 2 Device 2  . EXEL COMFORT POINT PEN NEEDLE 29G X 12MM MISC AS DIRECTED 100 each 0  . Insulin Syringe-Needle U-100 28G X 1/2" 0.5 ML MISC 1 Units by Does not apply route as directed. 100 each 4  . Lancets (ACCU-CHEK SOFT TOUCH) lancets TAKE AS DIRECTED. 100 each 0  . LANTUS 100 UNIT/ML injection INJECT 20 UNITS INTO THE SKIN DAILY 100 vial 3  . losartan (COZAAR) 50 MG tablet Take 0.5 tablets (25 mg total) by mouth daily. 30 tablet 0  . metFORMIN (GLUCOPHAGE) 500 MG tablet Take 1 tablet (500 mg total) by mouth 2 (two) times daily with a meal. 60 tablet 5  . ondansetron (ZOFRAN) 4 MG tablet Take 1 tablet (4 mg total) by mouth every 8 (eight) hours as needed for nausea or vomiting. 20 tablet 0  .  promethazine (PHENERGAN) 25 MG tablet TAKE ONE-HALF TO ONE TABLET BY MOUTH 4 TIMES DAILY (Patient taking differently: Take 12.5-25 mg by mouth every 6 (six) hours as needed for nausea or vomiting. ) 42 tablet 0  . simvastatin (ZOCOR) 40 MG tablet Take 1 tablet (40 mg total) by mouth daily. 30 tablet 1  . traMADol (ULTRAM) 50 MG tablet Take 1 tablet (50 mg total) by mouth every 6 (six) hours as needed for moderate pain. 42 tablet 3    Objective: BP 206/70 mmHg  Pulse 100  Temp(Src) 101 F (38.3 C) (Oral)  Resp 0  SpO2 96% Exam: General: lying in bed, appears uncomfortably Eyes: PERRLA, EOMI, no scleral icterus or discharge ENTM: MMM, no oropharyngeal erythema or exudates Neck: supple, no lymphadenopathy Cardiovascular: RRR, no murmurs appreciated Respiratory: CTAB, no  wheezes or rhonchi noted Abdomen: diffusely tender to palpation, without rebound or guarding, soft, non-distended, +BS MSK: moving all extremities spontaneously; no LE edema Skin: no rashes or bruises noted Neuro: A&Ox3, no focal deficits  Labs and Imaging: CBC BMET   Recent Labs Lab 08/09/15 2021  WBC 15.1*  HGB 15.9*  HCT 49.2*  PLT 434*    Recent Labs Lab 08/09/15 2021  NA 138  K 3.3*  CL 101  CO2 21*  BUN 6  CREATININE 0.71  GLUCOSE 236*  CALCIUM 9.3    Itrop: 0.05  EKG: sinus tachycardia, HR 103, minimal ST depression in V5 unchanged from prior. QTc 459  Ct Abdomen Pelvis W Contrast 08/09/2015  CLINICAL DATA:  Generalized abdominal pain with nausea and vomiting for 4 days. EXAM: CT ABDOMEN AND PELVIS WITH CONTRAST. IMPRESSION: Small amount of fluid and gas seen within the vagina, which is of uncertain etiology and clinical significance. Clinical correlation is recommended. Stable mildly enlarged fibroid uterus. 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 Stable small periumbilical ventral hernia containing only fat.    Verner Mould, MD 08/10/2015, 12:04 AM PGY-1, [place] Intern pager: [phone], text pages welcome  Upper Level Addendum:  I have seen and evaluated this patient along with Dr. Avon Gully and reviewed the above note, making necessary revisions in purple.   Archie Patten, MD [place] Resident, PGY-2

## 2015-08-09 NOTE — ED Notes (Signed)
Dr. Stark Jock notified of 233/85 blood pressure

## 2015-08-09 NOTE — ED Notes (Addendum)
Dr Stark Jock notified of high blood pressure, daughter at bedside states pts doctor discontinued 2 of her blood pressure medications recently.

## 2015-08-09 NOTE — ED Notes (Signed)
Pt arrived via [facility] from home c/o lethargy and N/V.  Family states she has had recent admission for the same, and that pt was talking in sentences yesterday.  CBG 200.  EMS gave 536ml NS, 4mg  Zofran at 1655.

## 2015-08-09 NOTE — ED Notes (Signed)
Dr Avon Gully notified of 193/101 blood pressure.  Waiting for orders.

## 2015-08-09 NOTE — ED Provider Notes (Signed)
CSN: [id number]     Arrival date & time 08/09/15  1715 History   First MD Initiated Contact with Patient 08/09/15 1720     Chief Complaint  Patient presents with  . Nausea  . Emesis  . Altered Mental Status     (Consider location/radiation/quality/duration/timing/severity/associated sxs/prior Treatment) HPI Comments: Patient is a 55 year old female with history of diabetes, hypertension, reflux, obesity. She was recently admitted for vomiting that was felt to be related to gastroparesis. She was discharged yesterday and was initially doing well. This afternoon she began to vomit again and experienced severe generalized abdominal pain. She denies any fevers or chills. She denies any bloody vomit or stools. Her last bowel movement was earlier today and normal. She denies any urinary complaints.  Patient is a 55 y.o. female presenting with vomiting. The history is provided by the patient and a relative.  Emesis Severity:  Severe Duration:  6 hours Timing:  Constant Progression:  Unchanged Chronicity:  New Recent urination:  Normal Relieved by:  Nothing Worsened by:  Nothing tried Ineffective treatments:  None tried Associated symptoms: no chills, no diarrhea and no fever     Past Medical History  Diagnosis Date  . Diabetes mellitus   . Hypertension   . GERD (gastroesophageal reflux disease)   . Nausea   . Hyperlipidemia    Past Surgical History  Procedure Laterality Date  . Breast surgery      reduction  . Cesarean section      x2  . Cholecystectomy      laparoscopic   Family History  Problem Relation Age of Onset  . Hypertension Mother   . Diabetes Mother   . Cancer Father     lung   Social History  Substance Use Topics  . Smoking status: Former Smoker -- 10 years    Types: Cigarettes    Quit date: 10/16/2011  . Smokeless tobacco: Never Used     Comment: 1 pack will last a week  . Alcohol Use: No   OB History    No data available     Review of Systems   Constitutional: Negative for chills.  Gastrointestinal: Positive for vomiting. Negative for diarrhea.  All other systems reviewed and are negative.     Allergies  Lisinopril  Home Medications   Prior to Admission medications   Medication Sig Start Date End Date Taking? Authorizing Provider  aspirin 325 MG tablet Take 1 tablet (325 mg total) by mouth daily. 05/11/14   Donnamae Jude, MD  carvedilol (COREG) 25 MG tablet TAKE ONE TABLET BY MOUTH TWICE DAILY WITH MEALS Patient taking differently: TAKE 25 MG BY MOUTH TWICE DAILY WITH MEALS 03/17/15   Donnamae Jude, MD  cyclobenzaprine (FLEXERIL) 10 MG tablet TAKE ONE TABLET BY MOUTH EVERY 8 HOURS AS NEEDED FOR MUSCLE SPASM 07/12/15   Donnamae Jude, MD  EPINEPHrine (EPIPEN) 0.3 mg/0.3 mL SOAJ injection Inject 0.3 mLs (0.3 mg total) into the muscle once. Patient not taking: Reported on 08/05/2015 05/12/13   Waldemar Dickens, MD  EXEL COMFORT POINT PEN NEEDLE 29G X 12MM MISC AS DIRECTED 06/19/15   Donnamae Jude, MD  Insulin Syringe-Needle U-100 28G X 1/2" 0.5 ML MISC 1 Units by Does not apply route as directed. 06/26/15   Donnamae Jude, MD  Lancets (ACCU-CHEK SOFT TOUCH) lancets TAKE AS DIRECTED. 05/20/15   Truett Mainland, DO  LANTUS 100 UNIT/ML injection INJECT 20 UNITS INTO THE SKIN DAILY 07/19/15  Donnamae Jude, MD  losartan (COZAAR) 50 MG tablet Take 0.5 tablets (25 mg total) by mouth daily. 08/08/15   Nicolette Bang, DO  metFORMIN (GLUCOPHAGE) 500 MG tablet Take 1 tablet (500 mg total) by mouth 2 (two) times daily with a meal. 05/11/14   Donnamae Jude, MD  ondansetron (ZOFRAN) 4 MG tablet Take 1 tablet (4 mg total) by mouth every 8 (eight) hours as needed for nausea or vomiting. 08/08/15   Nicolette Bang, DO  promethazine (PHENERGAN) 25 MG tablet TAKE ONE-HALF TO ONE TABLET BY MOUTH 4 TIMES DAILY Patient taking differently: Take 12.5-25 mg by mouth every 6 (six) hours as needed for nausea or vomiting.  06/19/15   Donnamae Jude, MD   simvastatin (ZOCOR) 40 MG tablet Take 1 tablet (40 mg total) by mouth daily. 08/08/15   Nicolette Bang, DO  traMADol (ULTRAM) 50 MG tablet Take 1 tablet (50 mg total) by mouth every 6 (six) hours as needed for moderate pain. 06/19/15   Donnamae Jude, MD   Temp(Src) 100.1 F (37.8 C) (Rectal)  SpO2 100% Physical Exam  Constitutional: She is oriented to person, place, and time. She appears well-developed and well-nourished. No distress.  HENT:  Head: Normocephalic and atraumatic.  Neck: Normal range of motion. Neck supple.  Cardiovascular: Normal rate and regular rhythm.  Exam reveals no gallop and no friction rub.   No murmur heard. Pulmonary/Chest: Effort normal and breath sounds normal. No respiratory distress. She has no wheezes.  Abdominal: Soft. Bowel sounds are normal. She exhibits no distension. There is tenderness. There is no rebound.  There is generalized abdominal tenderness. Exam is somewhat limited secondary to patient compliance, severity of symptoms.  Musculoskeletal: Normal range of motion.  Neurological: She is alert and oriented to person, place, and time.  Skin: Skin is warm and dry. She is not diaphoretic.  Nursing note and vitals reviewed.   ED Course  Procedures (including critical care time) Labs Review Labs Reviewed  LIPASE, BLOOD  COMPREHENSIVE METABOLIC PANEL  CBC WITH DIFFERENTIAL/PLATELET  I-STAT TROPOININ, ED    Imaging Review No results found. I have personally reviewed and evaluated these images and lab results as part of my medical decision-making.   EKG Interpretation   Date/Time:  Wednesday August 09 2015 17:35:38 EDT Ventricular Rate:  103 PR Interval:  165 QRS Duration: 97 QT Interval:  351 QTC Calculation: 459 R Axis:   44 Text Interpretation:  Sinus tachycardia Minimal ST depression, lateral  leads No significant change from 08/06/15 Confirmed by Trigger Frasier  MD, [place]  [phone]) on 08/09/2015 6:15:49 PM      MDM   Final  diagnoses:  None    Patient returns one day after discharge with severe abdominal pain, nausea, vomiting. Her prior admission was for similar complaints that were felt to be related to gastroparesis. Today she appears very uncomfortable. Her workup today reveals a white count of 15,000 with CO2 of 21, glucose of 236. She was hydrated with normal saline and given medication for pain and nausea. She is markedly hypertensive and was also given an IV dose of labetalol. She will be admitted to the family medicine service.    Veryl Speak, MD 08/09/15 2158

## 2015-08-09 NOTE — ED Notes (Signed)
Phlebotomy at bedside.

## 2015-08-10 DIAGNOSIS — R1084 Generalized abdominal pain: Secondary | ICD-10-CM | POA: Insufficient documentation

## 2015-08-10 DIAGNOSIS — K5900 Constipation, unspecified: Secondary | ICD-10-CM

## 2015-08-10 LAB — URINE MICROSCOPIC-ADD ON

## 2015-08-10 LAB — URINALYSIS, ROUTINE W REFLEX MICROSCOPIC
BILIRUBIN URINE: NEGATIVE
GLUCOSE, UA: 500 mg/dL — AB
Ketones, ur: 15 mg/dL — AB
Leukocytes, UA: NEGATIVE
Nitrite: NEGATIVE
Protein, ur: >300 mg/dL — AB
SPECIFIC GRAVITY, URINE: 1.018 (ref 1.005–1.030)
pH: 6.5 (ref 5.0–8.0)

## 2015-08-10 LAB — BASIC METABOLIC PANEL
ANION GAP: 11 (ref 5–15)
Anion gap: 17 — ABNORMAL HIGH (ref 5–15)
BUN: 6 mg/dL (ref 6–20)
BUN: 9 mg/dL (ref 6–20)
CALCIUM: 9.3 mg/dL (ref 8.9–10.3)
CHLORIDE: 102 mmol/L (ref 101–111)
CO2: 25 mmol/L (ref 22–32)
CO2: 27 mmol/L (ref 22–32)
Calcium: 8.9 mg/dL (ref 8.9–10.3)
Chloride: 100 mmol/L — ABNORMAL LOW (ref 101–111)
Creatinine, Ser: 0.77 mg/dL (ref 0.44–1.00)
Creatinine, Ser: 0.77 mg/dL (ref 0.44–1.00)
GFR calc Af Amer: >60 mL/min (ref >60)
GFR calc Af Amer: >60 mL/min (ref >60)
GFR calc non Af Amer: >60 mL/min (ref >60)
GLUCOSE: 267 mg/dL — AB (ref 65–99)
GLUCOSE: 210 mg/dL — AB (ref 65–99)
POTASSIUM: 3 mmol/L — AB (ref 3.5–5.1)
Potassium: 2.9 mmol/L — ABNORMAL LOW (ref 3.5–5.1)
Sodium: 142 mmol/L (ref 135–145)
Sodium: 140 mmol/L (ref 135–145)

## 2015-08-10 LAB — GLUCOSE, CAPILLARY
GLUCOSE-CAPILLARY: 202 mg/dL — AB (ref 65–99)
Glucose-Capillary: 223 mg/dL — ABNORMAL HIGH (ref 65–99)
Glucose-Capillary: 262 mg/dL — ABNORMAL HIGH (ref 65–99)
Glucose-Capillary: 223 mg/dL — ABNORMAL HIGH (ref 65–99)

## 2015-08-10 LAB — MAGNESIUM
Magnesium: 1.6 mg/dL — ABNORMAL LOW (ref 1.7–2.4)
Magnesium: 2.1 mg/dL (ref 1.7–2.4)

## 2015-08-10 LAB — CBC
HEMATOCRIT: 49.6 % — AB (ref 36.0–46.0)
Hemoglobin: 16.6 g/dL — ABNORMAL HIGH (ref 12.0–15.0)
MCH: 25.9 pg — AB (ref 26.0–34.0)
MCHC: 33.5 g/dL (ref 30.0–36.0)
MCV: 77.5 fL — ABNORMAL LOW (ref 78.0–100.0)
Platelets: 479 10*3/uL — ABNORMAL HIGH (ref 150–400)
RBC: 6.4 MIL/uL — ABNORMAL HIGH (ref 3.87–5.11)
RDW: 14.9 % (ref 11.5–15.5)
WBC: 16.5 10*3/uL — ABNORMAL HIGH (ref 4.0–10.5)

## 2015-08-10 LAB — LACTIC ACID, PLASMA
LACTIC ACID, VENOUS: 1.5 mmol/L (ref 0.5–2.0)
Lactic Acid, Venous: 1.1 mmol/L (ref 0.5–2.0)

## 2015-08-10 MED ORDER — LORAZEPAM 2 MG/ML IJ SOLN
1.0000 mg | Freq: Once | INTRAMUSCULAR | Status: AC
  Administered 2015-08-10: 1 mg via INTRAVENOUS
  Filled 2015-08-10: qty 1

## 2015-08-10 MED ORDER — MORPHINE SULFATE (PF) 4 MG/ML IV SOLN
4.0000 mg | Freq: Three times a day (TID) | INTRAVENOUS | Status: AC
  Administered 2015-08-10 – 2015-08-12 (×6): 4 mg via INTRAVENOUS
  Filled 2015-08-10 (×6): qty 1

## 2015-08-10 MED ORDER — POTASSIUM CHLORIDE CRYS ER 20 MEQ PO TBCR
40.0000 meq | EXTENDED_RELEASE_TABLET | Freq: Two times a day (BID) | ORAL | Status: AC
  Administered 2015-08-10 – 2015-08-11 (×2): 40 meq via ORAL
  Filled 2015-08-10 (×2): qty 2

## 2015-08-10 MED ORDER — POTASSIUM CHLORIDE 10 MEQ/100ML IV SOLN
10.0000 meq | INTRAVENOUS | Status: AC
  Administered 2015-08-10 (×3): 10 meq via INTRAVENOUS
  Filled 2015-08-10 (×3): qty 100

## 2015-08-10 MED ORDER — PANTOPRAZOLE SODIUM 40 MG IV SOLR
40.0000 mg | INTRAVENOUS | Status: DC
  Administered 2015-08-10 – 2015-08-11 (×2): 40 mg via INTRAVENOUS
  Filled 2015-08-10 (×2): qty 40

## 2015-08-10 MED ORDER — AMLODIPINE BESYLATE 10 MG PO TABS
10.0000 mg | ORAL_TABLET | Freq: Every day | ORAL | Status: DC
  Administered 2015-08-10 – 2015-08-13 (×4): 10 mg via ORAL
  Filled 2015-08-10 (×4): qty 1

## 2015-08-10 MED ORDER — ATORVASTATIN CALCIUM 20 MG PO TABS
20.0000 mg | ORAL_TABLET | Freq: Every day | ORAL | Status: DC
  Administered 2015-08-11 – 2015-08-12 (×2): 20 mg via ORAL
  Filled 2015-08-10 (×2): qty 1

## 2015-08-10 MED ORDER — MAGNESIUM SULFATE 2 GM/50ML IV SOLN
2.0000 g | Freq: Once | INTRAVENOUS | Status: AC
  Administered 2015-08-10: 2 g via INTRAVENOUS
  Filled 2015-08-10: qty 50

## 2015-08-10 MED ORDER — METOCLOPRAMIDE HCL 5 MG/ML IJ SOLN
5.0000 mg | Freq: Three times a day (TID) | INTRAMUSCULAR | Status: DC
  Administered 2015-08-10 – 2015-08-11 (×3): 5 mg via INTRAVENOUS
  Filled 2015-08-10 (×4): qty 2

## 2015-08-10 NOTE — Progress Notes (Signed)
New Admission Note:  Arrival Method: Via stretcher with nurse Tech Mental Orientation: Alert and oriented x4 Telemetry: n/a Assessment: Completed Skin: dry and intact IV: Left AC  Pain: See MAR Tubes: N/A Safety Measures: Safety Fall Prevention Plan was given, discussed and signed. Admission: Completed [facility] [address] Orientation: Patient has been orientated to the room, unit and the staff. Family: significant other at bedside  Orders have been reviewed and implemented. Will continue to monitor the patient. Call light has been placed within reach and bed alarm has been activated.   Leandro Reasoner BSN, RN  Phone Number: [phone] [facility]

## 2015-08-10 NOTE — Progress Notes (Signed)
Family Medicine Teaching Service Daily Progress Note Intern Pager: [phone]  Patient name: [name] Medical record number: [id number] Date of birth: [birth year] Age: 55 y.o. Gender: female  Primary Care Provider: Donnamae Jude, MD Consultants: None  Code Status: FULL   Pt Overview and Major Events to Date:  4/5: Patient admitted with vomiting and abdominal pain   Assessment and Plan: [name] is a 55 y.o. female presenting with emesis and abdominal pain. PMH is significant for Type II DM, HTN, GERD, obesity.  Abdominal pain, vomiting: Diabetic gastroparesis vs viral gastroenteritis. Patient recently admitted with same symptoms which improved after IVF and anti-emetics, however symptoms returned less than one day after discharge. Patient febrile to 101 on 4/5 and with WBC of 16.5.  Electrolytes stable. Patient does not appear dehydrated on admission (moist mucous membranes), though is slightly tachycardic. Endorses diffuse abdominal tenderness to palpation, which could be secondary to gastroparesis or retching. CT stable with no acute pathology- notes gas and fluid in the vagina which is changed from previous, however patient denies GU symptoms.  - Clear liquid diet - NS@150  mL/hr x 12hrs  - Morphine 2mg  q4hr PRN  - Zofran ODT 4mg  q8hr PRN  -will start on Reglan for possible diabetic gastroparesis  -consult GI  -GC/Chlamydia and bimanual exam today   Hypokalemia: K 3.3 at admission >> 2.9 (4/6).  -10 mEq K IV q1h x3 for repletion  -repeat BMET this afternoon   Type II DM:  On metformin and Lantus 20U at home. Last A1C 10.4 (two days prior to this admission). CBGs 202-269.  - Hold metformin - Moderate SSI - Lantus 10U (half home dose) due to poor PO intake  - CBG qACHS  HTN: . On losartan 25mg  (recently decreased from 50mg ), Coreg 25mg  BID. Formerly on HCTZ, amlodipine, and clonidine as well, though these medications were discontinued on recent discharge. Concern that  patient is non-compliant with meds, and as such did not need as many anti-hypertensives. Received IV labetalol and hydralazine in ED given elevated systolic and inability to tolerate PO.  - Continue losartan and Coreg - add back amlodipine  - continue to monitor, would like to avoid clonidine if possible in this patient with concerns for non compliance.   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  - Outpatient CT angiography recommended for further evaluation  FEN/GI: clear liquid diet, NS@150  mL/hr x12hrs  Prophylaxis: lovenox  Disposition: Home pending clinical improvement   Subjective:  Still with abdominal pain this morning, mostly midline. Still having emesis this AM. Has not had anything off breakfast tray yet this morning.   Objective: Temp:  [98.2 F (36.8 C)-101 F (38.3 C)] 98.2 F (36.8 C) (04/06 0839) Pulse Rate:  [86-112] 111 (04/06 0839) Resp:  [0-30] 17 (04/06 0511) BP: (133-235)/(69-104) 164/72 mmHg (04/06 0839) SpO2:  [94 %-100 %] 100 % (04/06 0839) Weight:  [289 lb 11 oz (131.4 kg)] 289 lb 11 oz (131.4 kg) (04/06 0006) Physical Exam: General: obese female lying in bed, appears uncomfortable Cardiovascular: RRR. No murmurs appreciated.  Respiratory: CTAB. Normal WOB.  Abdomen: +BS, soft, non-distended, diffusely TTP, without rebound or guarding  Extremities: No LE edema.  Laboratory:  Recent Labs Lab 08/07/15 0406 08/09/15 2021 08/10/15 0644  WBC 14.7* 15.1* 16.5*  HGB 15.6* 15.9* 16.6*  HCT 48.3* 49.2* 49.6*  PLT 475* 434* 479*    Recent Labs Lab 08/06/15 1659 08/07/15 0406 08/08/15 [id number] 08/09/15 2021 08/10/15 [id number]  NA 134* 139 138 138 142  K 3.2* 3.1* 3.6 3.3* 2.9*  CL 97* 101 103 101 100*  CO2 23 26 26  21* 25  BUN 16 12 21* 6 6  CREATININE 0.89 0.87 1.06* 0.71 0.77  CALCIUM 9.7 9.2 8.8* 9.3 9.3  PROT 9.6* 8.0  --  8.8*  --   BILITOT 0.8 0.6  --  0.5  --   ALKPHOS 154* 135*  --  129*  --   ALT 12*  16  --  17  --   AST 17 17  --  20  --   GLUCOSE 363* 304* 228* 236* 267*    Itrop: 0.05  EKG: sinus tachycardia, HR 103, minimal ST depression in V5 unchanged from prior. QTc 459  Imaging/Diagnostic Tests:  Ct Abdomen Pelvis W Contrast 08/09/2015 CLINICAL DATA: Generalized abdominal pain with nausea and vomiting for 4 days. EXAM: CT ABDOMEN AND PELVIS WITH CONTRAST. IMPRESSION: Small amount of fluid and gas seen within the vagina, which is of uncertain etiology and clinical significance. Clinical correlation is recommended. Stable mildly enlarged fibroid uterus. 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 Stable small periumbilical ventral hernia containing only fat.   Nicolette Bang, DO 08/10/2015, 9:38 AM PGY-1, [place] Intern pager: [phone], text pages welcome

## 2015-08-10 NOTE — Consult Note (Signed)
[facility] Consult: 12:54 PM 08/10/2015  LOS: 1 day    Referring Provider: Dr Nori Riis  Primary Care Physician:  Donnamae Jude, MD Primary Gastroenterologist:  Dr. Hilarie Fredrickson    Reason for Consultation:  N/V   HPI: [name] is a 55 y.o. female.  Type 2 DM, on scheduled but not SS insulin. HTN.  OSA.  Obesity.  HLD.   05/2011 Colonoscopy.  Screening study.  Mild sigmoid tics. Normal TI.    Admission with n/v, chest pain, htn urgency in 01/2012.   ED visit with n/v on 4/1: hydrated and sent home. WBCs 14.1 Returned with ongoing sxs.  Admitted 4/2 - 08/08/2015 with 3 days of n/v and abdominal pain and dehydration. sxs coincided with eating [employer] chicken.  Plain xray showed stool in colon.     Renal stone protocol CT 08/05/15 showed non-obstructing left renal stone, left renal artery aneurysm, probable fibroid uterus, small fat-containing umbilical hernia.  Her alk phos to 154 but other lfts and lipase normal. WBCs of 17.6.  Glucose into 300s. + bacteruria, +glucosuria but negative leukocytes, nitrates, + proteinuria.  A second U/A showed pyuria. No urine clx obtained.    N/V/ pain in lower and mid abdomen/pelvis continues.  Emesis clear: yellow to brown color, no blood.  Last BM was 1 week ago, at onset of sxs. Readmitted.  Sxs better with Zofran and Morphine.  Reglan IV started, now at 50m tid but only 2 doses so far.   08/09/15 CT scan abd/pelvis with contrast:  Small amount fluid/gas in vagina of unclear significance (per resindent this is c/w intercourse). Fibroid uterus.  The renal artery aneurysm.  Small, fat containing ventral hernia.    K, mag both low. Alk phos 129.  WBCs now 16.6, fever to 101.  U/A ordered.   Pain present with abdominal exam and on bimanual exam.  Vaginal swab for GC/Chlamydia obtained.  Pain is  worse with consumption of clear liquids.    Pt's blood sugars regularly run into 200s.  No optical issues and up to date on eye exam.  Up to date on pap smear.  +neuropathic sxs in feet. Family report 15 to 20# weight drop as pt works 2 jobs, 6AM to 930 PM 5 days per week ([employer]) so little time to eat. However current weight vs 06/10/2015 actually shows weight up 6#. Has rx for phenergan to use for nausea which occurs 2 or 3 x per month.  Normally has 2 BMs per month.  Takes 325 ASA daily.  No PPI at home.       Past Medical History  Diagnosis Date  . Diabetes mellitus   . Hypertension   . GERD (gastroesophageal reflux disease)   . Nausea   . Hyperlipidemia     Past Surgical History  Procedure Laterality Date  . Breast surgery      reduction  . Cesarean section      x2  . Cholecystectomy      laparoscopic    Prior to Admission medications  Medication Sig Start Date End Date Taking? Authorizing Provider  aspirin 325 MG tablet Take 1 tablet (325 mg total) by mouth daily. 05/11/14  Yes Tanya S Pratt, MD  carvedilol (COREG) 25 MG tablet TAKE ONE TABLET BY MOUTH TWICE DAILY WITH MEALS Patient taking differently: TAKE 25 MG BY MOUTH TWICE DAILY WITH MEALS 03/17/15  Yes Tanya S Pratt, MD  cyclobenzaprine (FLEXERIL) 10 MG tablet TAKE ONE TABLET BY MOUTH EVERY 8 HOURS AS NEEDED FOR MUSCLE SPASM 07/12/15  Yes Tanya S Pratt, MD  EPINEPHrine (EPIPEN) 0.3 mg/0.3 mL SOAJ injection Inject 0.3 mLs (0.3 mg total) into the muscle once. 05/12/13  Yes David J Merrell, MD  LANTUS 100 UNIT/ML injection INJECT 20 UNITS INTO THE SKIN DAILY 07/19/15  Yes Tanya S Pratt, MD  losartan (COZAAR) 50 MG tablet Take 0.5 tablets (25 mg total) by mouth daily. 08/08/15  Yes Catherine Lauren Wallace, DO  metFORMIN (GLUCOPHAGE) 500 MG tablet Take 1 tablet (500 mg total) by mouth 2 (two) times daily with a meal. 05/11/14  Yes Tanya S Pratt, MD  ondansetron (ZOFRAN) 4 MG tablet Take 1 tablet (4 mg total) by  mouth every 8 (eight) hours as needed for nausea or vomiting. 08/08/15  Yes Catherine Lauren Wallace, DO  promethazine (PHENERGAN) 25 MG tablet TAKE ONE-HALF TO ONE TABLET BY MOUTH 4 TIMES DAILY Patient taking differently: Take 12.5-25 mg by mouth every 6 (six) hours as needed for nausea or vomiting.  06/19/15  Yes Tanya S Pratt, MD  simvastatin (ZOCOR) 40 MG tablet Take 1 tablet (40 mg total) by mouth daily. 08/08/15  Yes Catherine Lauren Wallace, DO  traMADol (ULTRAM) 50 MG tablet Take 1 tablet (50 mg total) by mouth every 6 (six) hours as needed for moderate pain. 06/19/15  Yes Tanya S Pratt, MD    Scheduled Meds: . amLODipine  10 mg Oral Daily  . carvedilol  25 mg Oral BID WC  . enoxaparin (LOVENOX) injection  40 mg Subcutaneous Daily  . insulin aspart  0-15 Units Subcutaneous TID WC  . insulin aspart  0-5 Units Subcutaneous QHS  . insulin glargine  10 Units Subcutaneous Daily  . losartan  25 mg Oral Daily  . metoCLOPramide (REGLAN) injection  5 mg Intravenous 3 times per day  . potassium chloride  10 mEq Intravenous Q1 Hr x 3  . simvastatin  40 mg Oral Daily   Infusions:   PRN Meds: morphine injection, ondansetron   Allergies as of 08/09/2015 - Review Complete 08/09/2015  Allergen Reaction Noted  . Lisinopril Other (See Comments) 07/04/2006    Family History  Problem Relation Age of Onset  . Hypertension Mother   . Diabetes Mother   . Cancer Father     lung    Social History   Social History  . Marital Status: Legally Separated    Spouse Name: N/A  . Number of Children: N/A  . Years of Education: N/A   Occupational History  . Not on file.   Social History Main Topics  . Smoking status: Former Smoker -- 10 years    Types: Cigarettes    Quit date: 10/16/2011  . Smokeless tobacco: Never Used     Comment: 1 pack will last a week  . Alcohol Use: No  . Drug Use: No  . Sexual Activity: No   Other Topics Concern  . Not on file   Social History Narrative     REVIEW OF SYSTEMS: Constitutional:  Weight drop of  ENT:    No nose bleeds Pulm:  No SOB or cough CV:  No palpitations, no LE edema.  GU:  No hematuria, no frequency Gyn:  05/2014 PAP unremarkable.Marland Kitchen GI:  No dysphagia.  Per HPI Heme:  No excessive bleeding or bruising   Transfusions:  None per her and dtr's recall Neuro:  No headaches, no peripheral tingling or numbness Derm:  No itching, no rash or sores.  Endocrine:  No sweats or chills.  No polyuria or dysuria Immunization:  reviewed Travel:  None beyond local counties in last few months.    PHYSICAL EXAM: Vital signs in last 24 hours: Filed Vitals:   08/10/15 0511 08/10/15 0839  BP: 133/69 164/72  Pulse: 112 111  Temp: 98.6 F (37 C) 98.2 F (36.8 C)  Resp: 17    Wt Readings from Last 3 Encounters:  08/10/15 131.4 kg (289 lb 11 oz)  08/07/15 131.4 kg (289 lb 11 oz)  06/09/14 128.368 kg (283 lb)    General: obese, somnolent after Morphine.  Not toxic Head:  No asymmetry or swelling  Eyes:  No icterus or pallor Ears:  Not HOH  Nose:  No discharge Mouth:  Clear, moist.  Upper denture Neck:  No mass, no JVD.  No TMG Lungs:  Clear bil.  No cough or dyspnea Heart: mild tachy, regular.  No mrg Abdomen:   Obese, soft, ND, BS active.  Tender all over but most pronounced in mid to lower abdomen without guard or rebound.  Rectal: not performed   Musc/Skeltl: no joint contracture, no swelllig Extremities:  No CCE.  Feet warm.  3+ pedal pulse  Neurologic:  Somnolent but arousable and oriented x 3.  Moves all 4 limbs, no tremor.   Skin:  No telangectasia or rash   Intake/Output from previous day: 04/05 0701 - 04/06 0700 In: 1317.5 [P.O.:240; I.V.:1077.5] Out: 0  Intake/Output this shift:    LAB RESULTS:  Recent Labs  08/09/15 2021 08/10/15 0644  WBC 15.1* 16.5*  HGB 15.9* 16.6*  HCT 49.2* 49.6*  PLT 434* 479*   BMET Lab Results  Component Value Date   NA 142 08/10/2015   NA 138 08/09/2015   NA 138  08/08/2015   K 2.9* 08/10/2015   K 3.3* 08/09/2015   K 3.6 08/08/2015   CL 100* 08/10/2015   CL 101 08/09/2015   CL 103 08/08/2015   CO2 25 08/10/2015   CO2 21* 08/09/2015   CO2 26 08/08/2015   GLUCOSE 267* 08/10/2015   GLUCOSE 236* 08/09/2015   GLUCOSE 228* 08/08/2015   BUN 6 08/10/2015   BUN 6 08/09/2015   BUN 21* 08/08/2015   CREATININE 0.77 08/10/2015   CREATININE 0.71 08/09/2015   CREATININE 1.06* 08/08/2015   CALCIUM 9.3 08/10/2015   CALCIUM 9.3 08/09/2015   CALCIUM 8.8* 08/08/2015   LFT  Recent Labs  08/09/15 2021  PROT 8.8*  ALBUMIN 3.4*  AST 20  ALT 17  ALKPHOS 129*  BILITOT 0.5   PT/INR Lab Results  Component Value Date   INR 1.09 01/18/2012   Hepatitis Panel No results for input(s): HEPBSAG, HCVAB, HEPAIGM, HEPBIGM in the last 72 hours. C-Diff No components found for: CDIFF Lipase     Component Value Date/Time   LIPASE 30 08/09/2015 2021    Drugs of Abuse     Component Value Date/Time   LABOPIA NONE DETECTED 09/26/2008 1449   COCAINSCRNUR NONE DETECTED 09/26/2008 1449   LABBENZ NONE DETECTED 09/26/2008 1449   AMPHETMU NONE DETECTED 09/26/2008  1449   THCU NONE DETECTED 09/26/2008 1449   LABBARB  09/26/2008 1449    NONE DETECTED        DRUG SCREEN FOR MEDICAL PURPOSES ONLY.  IF CONFIRMATION IS NEEDED FOR ANY PURPOSE, NOTIFY LAB WITHIN 5 DAYS.        LOWEST DETECTABLE LIMITS FOR URINE DRUG SCREEN Drug Class       Cutoff (ng/mL) Amphetamine      1000 Barbiturate      200 Benzodiazepine   200 Tricyclics       300 Opiates          300 Cocaine          300 THC              50     RADIOLOGY STUDIES: Ct Abdomen Pelvis W Contrast  08/09/2015  CLINICAL DATA:  Generalized abdominal pain with nausea and vomiting for 4 days. EXAM: CT ABDOMEN AND PELVIS WITH CONTRAST TECHNIQUE: Multidetector CT imaging of the abdomen and pelvis was performed using the standard protocol following bolus administration of intravenous contrast. CONTRAST:  100mL  ISOVUE-300 IOPAMIDOL (ISOVUE-300) INJECTION 61% COMPARISON:  None. FINDINGS: Lower chest:  No acute findings. Hepatobiliary: No masses or other significant abnormality. Prior cholecystectomy noted. No evidence of biliary dilatation. Pancreas: No mass, inflammatory changes, or other significant abnormality. Spleen: Within normal limits in size and appearance. Adrenals/Urinary Tract: No masses identified. Tiny bilateral renal cysts noted. No evidence of hydronephrosis. Stomach/Bowel: No evidence of obstruction, inflammatory process, or abnormal fluid collections. Normal appendix visualized. Vascular/Lymphatic: No pathologically enlarged lymph nodes. No evidence of abdominal aortic aneurysm. 11 mm peripherally calcified left renal artery aneurysm remains stable. Reproductive: Mildly enlarged uterus with several small fibroids shows no significant change. No adnexal mass or free fluid identified. A small amount of fluid and gas is seen within the vagina on today's study, which is of uncertain etiology and clinical significance. Other: A small periumbilical ventral hernia containing only fat is unchanged. Musculoskeletal:  No suspicious bone lesions identified. IMPRESSION: Small amount of fluid and gas seen within the vagina, which is of uncertain etiology and clinical significance. Clinical correlation is recommended. Stable mildly enlarged fibroid uterus. 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 Stable small periumbilical ventral hernia containing only fat. Electronically Signed   By: John  Stahl M.D.   On: 08/09/2015 18:50    ENDOSCOPIC STUDIES: Per HPI  IMPRESSION:   *  Prolonged, 1 weeks hx N/V and abdominal pain with electrolyte disturbance.  Suspect element of diabetic gastroparesis as has background of periodic nausea controlled with Phenergan.  Reglan just initiated.  CT shows vaginal air and fluid, medical team just obtained vaginal swab.   Her WBCs consistently elevated and fever to 101  yesterday, no defined source.  Pyuria and bacteruria present on recent U/A, this is to be repeated. Non-obstructing kidney stone on recent CT.    *  Insulin requiring DM2.  Poorly controlled, A1c is 10.4.     PLAN:     *  Needs urine clx.   *  Will add daily IV Protonix.  Dr Danis to follow.   *  Consider laxative PR as no BM for 7 days, moderate stool seen on recent xray.     Sarah Gribbin  08/10/2015, 12:54 PM Pager: [phone]     I have reviewed the entire case in detail with the above APP and discussed the plan in detail.  Therefore, I agree with the diagnoses recorded above. In   addition,  I have personally interviewed and examined the patient and have personally reviewed any abdominal/pelvic CT scan images.  My additional thoughts are as follows:  I think this patient does have underlying diabetic gastropathy with generalized abdominal pain and vomiting, but there is probably an additional process going on.  Perhaps a UTI given the UA findings and documented fever.  CT findings in the pelvis are of unclear significance.  IV reglan, judicious use of narcotics, glucose control.  ducolax suppository ordered for constipation.  We will follow  Thanks for the consult.    Arsenia Goracke L Danis III Pager [phone]  Mon-Fri 8a-5p [phone] after 5p, weekends, holidays  

## 2015-08-11 ENCOUNTER — Inpatient Hospital Stay: Payer: Medicaid Other | Admitting: Internal Medicine

## 2015-08-11 DIAGNOSIS — G43A1 Cyclical vomiting, intractable: Secondary | ICD-10-CM

## 2015-08-11 LAB — COMPREHENSIVE METABOLIC PANEL
ALT: 15 U/L (ref 14–54)
AST: 16 U/L (ref 15–41)
Albumin: 2.8 g/dL — ABNORMAL LOW (ref 3.5–5.0)
Alkaline Phosphatase: 131 U/L — ABNORMAL HIGH (ref 38–126)
Anion gap: 13 (ref 5–15)
BUN: 13 mg/dL (ref 6–20)
CHLORIDE: 100 mmol/L — AB (ref 101–111)
CO2: 28 mmol/L (ref 22–32)
Calcium: 9.2 mg/dL (ref 8.9–10.3)
Creatinine, Ser: 0.89 mg/dL (ref 0.44–1.00)
Glucose, Bld: 236 mg/dL — ABNORMAL HIGH (ref 65–99)
POTASSIUM: 3.5 mmol/L (ref 3.5–5.1)
SODIUM: 141 mmol/L (ref 135–145)
Total Bilirubin: 0.7 mg/dL (ref 0.3–1.2)
Total Protein: 8 g/dL (ref 6.5–8.1)

## 2015-08-11 LAB — CBC
HCT: 48.7 % — ABNORMAL HIGH (ref 36.0–46.0)
Hemoglobin: 15.9 g/dL — ABNORMAL HIGH (ref 12.0–15.0)
MCH: 25.7 pg — ABNORMAL LOW (ref 26.0–34.0)
MCHC: 32.6 g/dL (ref 30.0–36.0)
MCV: 78.8 fL (ref 78.0–100.0)
PLATELETS: 480 10*3/uL — AB (ref 150–400)
RBC: 6.18 MIL/uL — AB (ref 3.87–5.11)
RDW: 15.3 % (ref 11.5–15.5)
WBC: 14.8 10*3/uL — AB (ref 4.0–10.5)

## 2015-08-11 LAB — GLUCOSE, CAPILLARY
GLUCOSE-CAPILLARY: 258 mg/dL — AB (ref 65–99)
GLUCOSE-CAPILLARY: 250 mg/dL — AB (ref 65–99)
Glucose-Capillary: 243 mg/dL — ABNORMAL HIGH (ref 65–99)
Glucose-Capillary: 237 mg/dL — ABNORMAL HIGH (ref 65–99)

## 2015-08-11 LAB — CERVICOVAGINAL ANCILLARY ONLY
CHLAMYDIA, DNA PROBE: NEGATIVE
Neisseria Gonorrhea: NEGATIVE

## 2015-08-11 MED ORDER — METOCLOPRAMIDE HCL 10 MG PO TABS: 10.0000 mg | ORAL_TABLET | Freq: Three times a day (TID) | ORAL | Status: DC

## 2015-08-11 MED ORDER — BISACODYL 10 MG RE SUPP
10.0000 mg | Freq: Once | RECTAL | Status: AC
  Administered 2015-08-12: 10 mg via RECTAL
  Filled 2015-08-11: qty 1

## 2015-08-11 MED ORDER — METOCLOPRAMIDE HCL 10 MG PO TABS
10.0000 mg | ORAL_TABLET | Freq: Three times a day (TID) | ORAL | Status: DC
  Administered 2015-08-11 – 2015-08-12 (×3): 10 mg via ORAL
  Filled 2015-08-11 (×3): qty 1

## 2015-08-11 MED ORDER — BISACODYL 10 MG RE SUPP
10.0000 mg | Freq: Once | RECTAL | Status: AC
  Administered 2015-08-11: 10 mg via RECTAL
  Filled 2015-08-11: qty 1

## 2015-08-11 MED ORDER — METOCLOPRAMIDE HCL 5 MG/ML IJ SOLN
10.0000 mg | Freq: Four times a day (QID) | INTRAMUSCULAR | Status: DC
  Administered 2015-08-11: 10 mg via INTRAVENOUS

## 2015-08-11 MED ORDER — METOCLOPRAMIDE HCL 5 MG/ML IJ SOLN: 10.0000 mg | Freq: Four times a day (QID) | INTRAMUSCULAR | Status: DC

## 2015-08-11 MED ORDER — PANTOPRAZOLE SODIUM 40 MG PO TBEC
40.0000 mg | DELAYED_RELEASE_TABLET | Freq: Every day | ORAL | Status: DC
  Administered 2015-08-12 – 2015-08-13 (×2): 40 mg via ORAL
  Filled 2015-08-11 (×2): qty 1

## 2015-08-11 MED ORDER — INSULIN GLARGINE 100 UNIT/ML ~~LOC~~ SOLN
15.0000 [IU] | Freq: Every day | SUBCUTANEOUS | Status: DC
  Administered 2015-08-11: 15 [IU] via SUBCUTANEOUS
  Filled 2015-08-11 (×2): qty 0.15

## 2015-08-11 MED ORDER — SODIUM CHLORIDE 0.45 % IV SOLN
INTRAVENOUS | Status: DC
Start: 2015-08-11 — End: 2015-08-13
  Administered 2015-08-11 – 2015-08-13 (×3): via INTRAVENOUS
  Filled 2015-08-11 (×3): qty 1000

## 2015-08-11 NOTE — Progress Notes (Signed)
Family Medicine Teaching Service Daily Progress Note Intern Pager: [phone]  Patient name: [name] Medical record number: [id number] Date of birth: 1960/12/15 Age: 55 y.o. Gender: female  Primary Care Provider: Donnamae Jude, MD Consultants: GI Code Status: FULL   Pt Overview and Major Events to Date:  4/5: Patient admitted with vomiting and abdominal pain  4/6: GI consulted, IV Reglan started for probable diabetic gastroparesis   Assessment and Plan: [name] is a 55 y.o. female presenting with emesis and abdominal pain. PMH is significant for Type II DM, HTN, GERD, obesity.  Abdominal pain, vomiting: Diabetic gastroparesis vs viral gastroenteritis vs. Infectious process. Patient recently admitted with same symptoms which improved after IVF and anti-emetics, however symptoms returned less than one day after discharge. Patient febrile at admission but has been afebrile for 24 hours. WBC improving to 14.8. CT stable with no acute pathology- notes gas and fluid in the vagina which is changed from previous, however patient denies GU symptoms.   - Clear liquid diet - 1/2 NS at 75 cc/hr, continued encouragement of PO intake as tolerated  - Morphine 2mg  q4hr PRN  - Zofran ODT 4mg  q8hr PRN  -will start on Reglan for possible diabetic gastroparesis  -consult GI, appreciate recs  -GC/Chlamydia negative  -IV protonix started  -UA without signs of infection and negative for pyuria  -dulcolax suppository for constipation   Hypokalemia/Hypomagnesia, Resolved: K 3.5 today after oral Kdur repletion for K of 3.0. Magnesium 2.1 after repletion. --continue to monitor   Type II DM:  On metformin and Lantus 20U at home. Last A1C 10.4 (two days prior to this admission). CBGs [id number]. Required 12 units of Novolog.  - Hold metformin - Moderate SSI - Lantus increased to 15u  - CBG qACHS  HTN: . On losartan 25mg  (recently decreased from 50mg ), Coreg 25mg  BID. Formerly on HCTZ, amlodipine, and  clonidine as well, though these medications were discontinued on recent discharge. Concern that patient is non-compliant with meds, and as such did not need as many anti-hypertensives.  -continue losartan, coreg, and amlodopine - continue to monitor, would like to avoid clonidine if possible in this patient with concerns for non compliance.   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  - Outpatient CT angiography recommended for further evaluation  FEN/GI: clear liquid diet, NS@150  mL/hr x12hrs  Prophylaxis: lovenox  Disposition: Home pending clinical improvement   Subjective:  Last episode of emesis yesterday evening. Tolerating small sips of fluids but still feeling quite nauseous. Last BM one week ago.   Objective: Temp:  [97.6 F (36.4 C)-98.5 F (36.9 C)] 98.2 F (36.8 C) (04/07 0733) Pulse Rate:  [80-113] 111 (04/07 0733) Resp:  [17-20] 20 (04/07 0733) BP: (98-197)/(49-92) 159/92 mmHg (04/07 0733) SpO2:  [97 %-99 %] 98 % (04/07 0733) Physical Exam: General: obese female lying in bed, appears uncomfortable Cardiovascular: RRR. No murmurs appreciated.  Respiratory: CTAB. Normal WOB.  Abdomen: +BS, soft, non-distended, diffusely TTP, without rebound or guarding  Extremities: No LE edema.  Laboratory:  Recent Labs Lab 08/09/15 2021 08/10/15 0644 08/11/15 0528  WBC 15.1* 16.5* 14.8*  HGB 15.9* 16.6* 15.9*  HCT 49.2* 49.6* 48.7*  PLT 434* 479* 480*    Recent Labs Lab 08/07/15 0406  08/09/15 2021 08/10/15 0644 08/10/15 2000 08/11/15 0528  NA 139  < > 138 142 140 141  K 3.1*  < > 3.3* 2.9* 3.0* 3.5  CL 101  < > 101 100*  102 100*  CO2 26  < > 21* 25 27 28   BUN 12  < > 6 6 9 13   CREATININE 0.87  < > 0.71 0.77 0.77 0.89  CALCIUM 9.2  < > 9.3 9.3 8.9 9.2  PROT 8.0  --  8.8*  --   --  8.0  BILITOT 0.6  --  0.5  --   --  0.7  ALKPHOS 135*  --  129*  --   --  131*  ALT 16  --  17  --   --  15  AST 17  --  20  --   --  16   GLUCOSE 304*  < > 236* 267* 210* 236*  < > = values in this interval not displayed.  Itrop: 0.05  EKG: sinus tachycardia, HR 103, minimal ST depression in V5 unchanged from prior. QTc 459  Imaging/Diagnostic Tests:  Ct Abdomen Pelvis W Contrast 08/09/2015 CLINICAL DATA: Generalized abdominal pain with nausea and vomiting for 4 days. EXAM: CT ABDOMEN AND PELVIS WITH CONTRAST. IMPRESSION: Small amount of fluid and gas seen within the vagina, which is of uncertain etiology and clinical significance. Clinical correlation is recommended. Stable mildly enlarged fibroid uterus. 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 Stable small periumbilical ventral hernia containing only fat.   Nicolette Bang, DO 08/11/2015, 9:26 AM PGY-1, [facility] Intern pager: [phone], text pages welcome

## 2015-08-11 NOTE — Plan of Care (Signed)
Problem: Pain Managment: Goal: General experience of comfort will improve Outcome: Progressing Still c/o abdominal pain in muscles due to vomitting.  Problem: Activity: Goal: Risk for activity intolerance will decrease Outcome: Progressing General weakness due to n/v  Problem: Fluid Volume: Goal: Ability to maintain a balanced intake and output will improve Outcome: Progressing Restarting IVF today  Problem: Nutrition: Goal: Adequate nutrition will be maintained Outcome: Progressing Remains on clear liquids, due to n/v.

## 2015-08-11 NOTE — Progress Notes (Signed)
Daily Rounding Note  08/11/2015, 2:48 PM  LOS: 2 days   SUBJECTIVE:       No n/v thus far today.  Tolerating clears and would like to try full liquids.  + BM.  Abdomen is sore to touch, but the lower abdominal pain has passed.   OBJECTIVE:         Vital signs in last 24 hours:    Temp:  [97.6 F (36.4 C)-98.5 F (36.9 C)] 98.2 F (36.8 C) (04/07 0733) Pulse Rate:  [80-113] 111 (04/07 0733) Resp:  [18-20] 20 (04/07 0733) BP: (98-172)/(49-92) 142/90 mmHg (04/07 1026) SpO2:  [97 %-99 %] 98 % (04/07 0733) Last BM Date: 08/04/15 Filed Weights   08/10/15 0006  Weight: 131.4 kg (289 lb 11 oz)   General: looks somewhat ill    Heart: RRR Chest: clear bil.  No dypnea or cough Abdomen: obese, active BS.  Soft, diffusely tender to soft/medium pressure  Extremities: no CCE Neuro/Psych:  Oriented x 3.  No tremor, no involuntary movement or lip smacking.   Intake/Output from previous day: 04/06 0701 - 04/07 0700 In: 450 [P.O.:50; IV Piggyback:400] Out: 0   Intake/Output this shift: Total I/O In: 330 [P.O.:330] Out: -   Lab Results:  Recent Labs  08/09/15 2021 08/10/15 0644 08/11/15 0528  WBC 15.1* 16.5* 14.8*  HGB 15.9* 16.6* 15.9*  HCT 49.2* 49.6* 48.7*  PLT 434* 479* 480*   BMET  Recent Labs  08/10/15 0644 08/10/15 2000 08/11/15 0528  NA 142 140 141  K 2.9* 3.0* 3.5  CL 100* 102 100*  CO2 25 27 28   GLUCOSE 267* 210* 236*  BUN 6 9 13   CREATININE 0.77 0.77 0.89  CALCIUM 9.3 8.9 9.2   LFT  Recent Labs  08/09/15 2021 08/11/15 0528  PROT 8.8* 8.0  ALBUMIN 3.4* 2.8*  AST 20 16  ALT 17 15  ALKPHOS 129* 131*  BILITOT 0.5 0.7   PT/INR No results for input(s): LABPROT, INR in the last 72 hours. Hepatitis Panel No results for input(s): HEPBSAG, HCVAB, HEPAIGM, HEPBIGM in the last 72 hours.  Studies/Results: Ct Abdomen Pelvis W Contrast  08/09/2015  CLINICAL DATA:  Generalized abdominal pain  with nausea and vomiting for 4 days. EXAM: CT ABDOMEN AND PELVIS WITH CONTRAST TECHNIQUE: Multidetector CT imaging of the abdomen and pelvis was performed using the standard protocol following bolus administration of intravenous contrast. CONTRAST:  163mL ISOVUE-300 IOPAMIDOL (ISOVUE-300) INJECTION 61% COMPARISON:  None. FINDINGS: Lower chest:  No acute findings. Hepatobiliary: No masses or other significant abnormality. Prior cholecystectomy noted. No evidence of biliary dilatation. Pancreas: No mass, inflammatory changes, or other significant abnormality. Spleen: Within normal limits in size and appearance. Adrenals/Urinary Tract: No masses identified. Tiny bilateral renal cysts noted. No evidence of hydronephrosis. Stomach/Bowel: No evidence of obstruction, inflammatory process, or abnormal fluid collections. Normal appendix visualized. Vascular/Lymphatic: No pathologically enlarged lymph nodes. No evidence of abdominal aortic aneurysm. 11 mm peripherally calcified left renal artery aneurysm remains stable. Reproductive: Mildly enlarged uterus with several small fibroids shows no significant change. No adnexal mass or free fluid identified. A small amount of fluid and gas is seen within the vagina on today's study, which is of uncertain etiology and clinical significance. Other: A small periumbilical ventral hernia containing only fat is unchanged. Musculoskeletal:  No suspicious bone lesions identified. IMPRESSION: Small amount of fluid and gas seen within the vagina, which is of uncertain etiology and clinical significance.  Clinical correlation is recommended. Stable mildly enlarged fibroid uterus. 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 Stable small periumbilical ventral hernia containing only fat. Electronically Signed   By: Earle Gell M.D.   On: 08/09/2015 18:50   Scheduled Meds: . amLODipine  10 mg Oral Daily  . atorvastatin  20 mg Oral q1800  . [START ON 08/12/2015] bisacodyl  10 mg Rectal Once    . carvedilol  25 mg Oral BID WC  . enoxaparin (LOVENOX) injection  40 mg Subcutaneous Daily  . insulin aspart  0-15 Units Subcutaneous TID WC  . insulin aspart  0-5 Units Subcutaneous QHS  . insulin glargine  15 Units Subcutaneous Daily  . losartan  25 mg Oral Daily  . metoCLOPramide (REGLAN) injection  10 mg Intravenous 4 times per day  .  morphine injection  4 mg Intravenous 3 times per day  . pantoprazole (PROTONIX) IV  40 mg Intravenous Q24H   Continuous Infusions: . sodium chloride 0.45 % 1,000 mL infusion 75 mL/hr at 08/11/15 1031   PRN Meds:.morphine injection, ondansetron  ASSESMENT:   *  N/V, acute/persistent now but previously intermittent episodes.  Likely diabetic gastroparesis.  sxs better.  Vaginal swab negative for chlamydia and gonorrhea. U/A not worrisome for UTI.   WBCs improved.      PLAN   *  Advance to full liquids. Change to po Reglan, 10 mg QID, though would like to drop dose to 5 mg ASAP and wean off ASAP, if possible.  Switch to oral protonix one daily.     Azucena Freed  08/11/2015, 2:48 PM Pager: [phone]  I have discussed the case with the PA, and that is the plan I formulated.  CC: Nausea and vomiting Other Dx as above  I would drop the dose of reglan to 5 mg tid as soon as possible, then give her about 3-4 weeks of it to get this condition under control.  Give usual precautions about tardive dyskinesia.

## 2015-08-12 LAB — CBC
HEMATOCRIT: 44.2 % (ref 36.0–46.0)
HEMOGLOBIN: 13.8 g/dL (ref 12.0–15.0)
MCH: 25.1 pg — ABNORMAL LOW (ref 26.0–34.0)
MCHC: 31.2 g/dL (ref 30.0–36.0)
MCV: 80.4 fL (ref 78.0–100.0)
Platelets: 442 10*3/uL — ABNORMAL HIGH (ref 150–400)
RBC: 5.5 MIL/uL — AB (ref 3.87–5.11)
RDW: 15.1 % (ref 11.5–15.5)
WBC: 11.6 10*3/uL — ABNORMAL HIGH (ref 4.0–10.5)

## 2015-08-12 LAB — GLUCOSE, CAPILLARY
GLUCOSE-CAPILLARY: 310 mg/dL — AB (ref 65–99)
GLUCOSE-CAPILLARY: 159 mg/dL — AB (ref 65–99)
GLUCOSE-CAPILLARY: 186 mg/dL — AB (ref 65–99)
GLUCOSE-CAPILLARY: 138 mg/dL — AB (ref 65–99)

## 2015-08-12 MED ORDER — INSULIN GLARGINE 100 UNIT/ML ~~LOC~~ SOLN
20.0000 [IU] | Freq: Every day | SUBCUTANEOUS | Status: DC
  Administered 2015-08-12 – 2015-08-13 (×2): 20 [IU] via SUBCUTANEOUS
  Filled 2015-08-12 (×2): qty 0.2

## 2015-08-12 MED ORDER — METOCLOPRAMIDE HCL 5 MG PO TABS
5.0000 mg | ORAL_TABLET | Freq: Three times a day (TID) | ORAL | Status: DC
  Administered 2015-08-12 – 2015-08-13 (×5): 5 mg via ORAL
  Filled 2015-08-12 (×5): qty 1

## 2015-08-12 NOTE — Progress Notes (Signed)
Family Medicine Teaching Service Daily Progress Note Intern Pager: [phone]  Patient name: [name] Medical record number: [id number] Date of birth: 1960/12/19 Age: 55 y.o. Gender: female  Primary Care Provider: Donnamae Jude, MD Consultants: GI Code Status: FULL   Pt Overview and Major Events to Date:  4/5: Patient admitted with vomiting and abdominal pain  4/6: GI consulted, IV Reglan started for probable diabetic gastroparesis   Assessment and Plan: [name] is a 55 y.o. female presenting with emesis and abdominal pain. PMH is significant for Type II DM, HTN, GERD, obesity.  #Abdominal pain, vomiting: Most likely Diabetic gastroparesis. Doubtful for viral gastroenteritis vs. Infectious process.  - consult GI, appreciate recs  - Reglan 10 mg QID-->5 mg QID today  - Morphine 2mg  q4hr PRN  - Zofran ODT 4mg  q8hr PRN  - PO protonix 40 mg QD - dulcolax suppository for constipation   #Type II DM:  On metformin and Lantus 20U at home. Last A1C 10.4 (two days prior to this admission). CBGs [id number]. Required 12 units of Novolog.  - Lantus increased to 20 u  - Hold metformin - Moderate SSI - CBG qACHS  #HTN: . On losartan 25mg  (recently decreased from 50mg ), Coreg 25mg  BID. Formerly on HCTZ, amlodipine, and clonidine as well, though these medications were discontinued on recent discharge. Concern that patient is non-compliant with meds, and as such did not need as many anti-hypertensives.  -continue losartan, coreg, and amlodopine - continue to monitor, would like to avoid clonidine if possible in this patient with concerns for non compliance  #L Renal Artery Aneurysm: Incidental finding on CT renal stone study (08/05/15).  - Outpatient CT angiography recommended for further evaluation  #Hypokalemia/Hypomagnesia: Resolved.  Magnesium 2.1 after repletion. - continue to monitor   FEN/GI: Full liquid diet, 1/2 NS@10  mL/hr  Prophylaxis: lovenox  Disposition: Home pending  clinical improvement   Subjective:  Sitting up in chair. Denies any ab pain. She used the bathroom this morning with no problems.   Objective: Temp:  [97.6 F (36.4 C)-99.1 F (37.3 C)] 98.5 F (36.9 C) (04/08 0848) Pulse Rate:  [94-106] 94 (04/08 0848) Resp:  [17-20] 18 (04/08 0848) BP: (127-151)/(56-90) 127/56 mmHg (04/08 0848) SpO2:  [93 %-100 %] 93 % (04/08 0848) Weight:  [248 lb 12.8 oz (112.855 kg)] 248 lb 12.8 oz (112.855 kg) (04/07 2127) Physical Exam: General: obese female sitting up bed,  Cardiovascular: RRR. No murmurs appreciated.  Respiratory: CTAB. Normal WOB.  Abdomen: +BS, soft, non-distended, diffusely TTP, without rebound or guarding  Extremities: No LE edema.  Laboratory:  Recent Labs Lab 08/10/15 0644 08/11/15 0528 08/12/15 0608  WBC 16.5* 14.8* 11.6*  HGB 16.6* 15.9* 13.8  HCT 49.6* 48.7* 44.2  PLT 479* 480* 442*    Recent Labs Lab 08/07/15 0406  08/09/15 2021 08/10/15 0644 08/10/15 2000 08/11/15 0528  NA 139  < > 138 142 140 141  K 3.1*  < > 3.3* 2.9* 3.0* 3.5  CL 101  < > 101 100* 102 100*  CO2 26  < > 21* 25 27 28   BUN 12  < > 6 6 9 13   CREATININE 0.87  < > 0.71 0.77 0.77 0.89  CALCIUM 9.2  < > 9.3 9.3 8.9 9.2  PROT 8.0  --  8.8*  --   --  8.0  BILITOT 0.6  --  0.5  --   --  0.7  ALKPHOS 135*  --  129*  --   --  131*  ALT 16  --  17  --   --  15  AST 17  --  20  --   --  16  GLUCOSE 304*  < > 236* 267* 210* 236*  < > = values in this interval not displayed.  Itrop: 0.05  EKG: sinus tachycardia, HR 103, minimal ST depression in V5 unchanged from prior. QTc 459  Imaging/Diagnostic Tests:  Ct Abdomen Pelvis W Contrast 08/09/2015 CLINICAL DATA: Generalized abdominal pain with nausea and vomiting for 4 days. EXAM: CT ABDOMEN AND PELVIS WITH CONTRAST. IMPRESSION: Small amount of fluid and gas seen within the vagina, which is of uncertain etiology and clinical significance. Clinical correlation is recommended. Stable mildly enlarged  fibroid uterus. 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 Stable small periumbilical ventral hernia containing only fat.   Rosemarie Ax, MD 08/12/2015, 9:17 AM PGY-3, [place] Intern pager: [phone], text pages welcome

## 2015-08-13 LAB — CBC
HCT: 41.6 % (ref 36.0–46.0)
HEMOGLOBIN: 12.9 g/dL (ref 12.0–15.0)
MCH: 24.7 pg — ABNORMAL LOW (ref 26.0–34.0)
MCHC: 31 g/dL (ref 30.0–36.0)
MCV: 79.5 fL (ref 78.0–100.0)
Platelets: 387 10*3/uL (ref 150–400)
RBC: 5.23 MIL/uL — AB (ref 3.87–5.11)
RDW: 15.1 % (ref 11.5–15.5)
WBC: 10.2 10*3/uL (ref 4.0–10.5)

## 2015-08-13 LAB — BASIC METABOLIC PANEL
Anion gap: 12 (ref 5–15)
BUN: 21 mg/dL — ABNORMAL HIGH (ref 6–20)
CHLORIDE: 101 mmol/L (ref 101–111)
CO2: 27 mmol/L (ref 22–32)
CREATININE: 1.07 mg/dL — AB (ref 0.44–1.00)
Calcium: 9.1 mg/dL (ref 8.9–10.3)
GFR, EST NON AFRICAN AMERICAN: 57 mL/min — AB (ref >60)
Glucose, Bld: 191 mg/dL — ABNORMAL HIGH (ref 65–99)
Potassium: 3.4 mmol/L — ABNORMAL LOW (ref 3.5–5.1)
SODIUM: 140 mmol/L (ref 135–145)

## 2015-08-13 LAB — GLUCOSE, CAPILLARY
GLUCOSE-CAPILLARY: 175 mg/dL — AB (ref 65–99)
GLUCOSE-CAPILLARY: 210 mg/dL — AB (ref 65–99)

## 2015-08-13 MED ORDER — ATORVASTATIN CALCIUM 20 MG PO TABS: 20.0000 mg | ORAL_TABLET | Freq: Every day | ORAL | Status: DC

## 2015-08-13 MED ORDER — AMLODIPINE BESYLATE 10 MG PO TABS: 10.0000 mg | ORAL_TABLET | Freq: Every day | ORAL | Status: DC

## 2015-08-13 MED ORDER — POTASSIUM CHLORIDE CRYS ER 20 MEQ PO TBCR
40.0000 meq | EXTENDED_RELEASE_TABLET | Freq: Once | ORAL | Status: AC
  Administered 2015-08-13: 40 meq via ORAL
  Filled 2015-08-13: qty 2

## 2015-08-13 MED ORDER — PANTOPRAZOLE SODIUM 40 MG PO TBEC: 40.0000 mg | DELAYED_RELEASE_TABLET | Freq: Every day | ORAL | Status: DC

## 2015-08-13 MED ORDER — METOCLOPRAMIDE HCL 5 MG PO TABS: 5.0000 mg | ORAL_TABLET | Freq: Three times a day (TID) | ORAL | Status: DC

## 2015-08-13 NOTE — Progress Notes (Signed)
Family Medicine Teaching Service Daily Progress Note Intern Pager: [phone]  Patient name: [name] Medical record number: [id number] Date of birth: 07/05/1960 Age: 55 y.o. Gender: female  Primary Care Provider: Donnamae Jude, MD Consultants: GI Code Status: FULL   Pt Overview and Major Events to Date:  4/5: Patient admitted with vomiting and abdominal pain  4/6: GI consulted, IV Reglan started for probable diabetic gastroparesis   Assessment and Plan: [name] is a 55 y.o. female presenting with emesis and abdominal pain. PMH is significant for Type II DM, HTN, GERD, obesity.  #Abdominal pain, vomiting: Most likely Diabetic gastroparesis. Doubtful for viral gastroenteritis vs. Infectious process.  - consult GI, appreciate recs  - Reglan 5 mg TID, GI recommends patient continue this for 3-4 weeks to get gastroparesis under control  - Morphine 2mg  q4hr PRN  - Zofran ODT 4mg  q8hr PRN  - PO protonix 40 mg QD - dulcolax suppository for constipation   #Type II DM:  On metformin and Lantus 20U at home. Last A1C 10.4 (two days prior to this admission). CBGs 138-191.  Required 6 units of Novolog.  - Lantus 20u daily  - Hold metformin - Moderate SSI - CBG qACHS  #HTN: . On losartan 25mg  (recently decreased from 50mg ), Coreg 25mg  BID. Formerly on HCTZ, amlodipine, and clonidine as well, though these medications were discontinued on recent discharge. Concern that patient is non-compliant with meds, and as such did not need as many anti-hypertensives.  -continue losartan, coreg, and amlodopine - continue to monitor, would like to avoid clonidine if possible in this patient with concerns for non compliance  #L Renal Artery Aneurysm: Incidental finding on CT renal stone study (08/05/15).  - Outpatient CT angiography recommended for further evaluation  #Hypokalemia/Hypomagnesia: Resolved.  Magnesium 2.1 after repletion. - continue to monitor   FEN/GI: Heart Healthy/Carb Modified  Diet, 1/2 NS@10  mL/hr  Prophylaxis: lovenox  Disposition: Home pending clinical improvement   Subjective:  Feeling much better this AM. Still with mild nausea but no emesis. Having bowel movements. Tolerating regular diet.   Objective: Temp:  [98.4 F (36.9 C)-98.6 F (37 C)] 98.6 F (37 C) (04/09 0537) Pulse Rate:  [84-94] 92 (04/09 0537) Resp:  [16-18] 18 (04/09 0537) BP: (127-142)/(56-83) 129/73 mmHg (04/09 0537) SpO2:  [93 %-100 %] 100 % (04/09 0537) Weight:  [251 lb 1.6 oz (113.898 kg)] 251 lb 1.6 oz (113.898 kg) (04/08 2128) Physical Exam: General: obese female sitting up bed,  Cardiovascular: RRR. No murmurs appreciated.  Respiratory: CTAB. Normal WOB.  Abdomen: +BS, soft, non-distended, diffusely TTP, without rebound or guarding  Extremities: No LE edema.  Laboratory:  Recent Labs Lab 08/11/15 0528 08/12/15 0608 08/13/15 0508  WBC 14.8* 11.6* 10.2  HGB 15.9* 13.8 12.9  HCT 48.7* 44.2 41.6  PLT 480* 442* 387    Recent Labs Lab 08/07/15 0406  08/09/15 2021  08/10/15 2000 08/11/15 0528 08/13/15 0508  NA 139  < > 138  < > 140 141 140  K 3.1*  < > 3.3*  < > 3.0* 3.5 3.4*  CL 101  < > 101  < > 102 100* 101  CO2 26  < > 21*  < > 27 28 27   BUN 12  < > 6  < > 9 13 21*  CREATININE 0.87  < > 0.71  < > 0.77 0.89 1.07*  CALCIUM 9.2  < > 9.3  < > 8.9 9.2 9.1  PROT 8.0  --  8.8*  --   --  8.0  --   BILITOT 0.6  --  0.5  --   --  0.7  --   ALKPHOS 135*  --  129*  --   --  131*  --   ALT 16  --  17  --   --  15  --   AST 17  --  20  --   --  16  --   GLUCOSE 304*  < > 236*  < > 210* 236* 191*  < > = values in this interval not displayed.  Itrop: 0.05  EKG: sinus tachycardia, HR 103, minimal ST depression in V5 unchanged from prior. QTc 459  Imaging/Diagnostic Tests:  Ct Abdomen Pelvis W Contrast 08/09/2015 CLINICAL DATA: Generalized abdominal pain with nausea and vomiting for 4 days. EXAM: CT ABDOMEN AND PELVIS WITH CONTRAST. IMPRESSION: Small amount of fluid  and gas seen within the vagina, which is of uncertain etiology and clinical significance. Clinical correlation is recommended. Stable mildly enlarged fibroid uterus. 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 Stable small periumbilical ventral hernia containing only fat.   Nicolette Bang, DO 08/13/2015, 7:22 AM PGY-3, [place] Intern pager: [phone], text pages welcome

## 2015-08-13 NOTE — Discharge Summary (Signed)
[facility] Discharge Summary  Patient name: [name] Medical record number: [id number] Date of birth: 1960/05/28 Age: 55 y.o. Gender: female Date of Admission: 08/09/2015  Date of Discharge: 08/13/2015 Admitting Physician: Dickie La, MD  Primary Care Provider: Donnamae Jude, MD Consultants: GI   Indication for Hospitalization: Abdominal pain with N/V   Discharge Diagnoses/Problem List:  Patient Active Problem List   Diagnosis Date Noted  . Generalized abdominal pain   . Diabetic gastroparesis ([place]) 08/09/2015  . Emesis, persistent 08/09/2015  . Accelerated hypertension   . Type 2 diabetes mellitus without complication, without long-term current use of insulin ([place])   . Uncontrollable vomiting   . Dehydration 08/06/2015  . Low back pain 06/09/2014  . Poorly controlled type 2 diabetes mellitus ([place]) 05/11/2014  . Obstructive sleep apnea of adult 01/16/2012  . GERD (gastroesophageal reflux disease) 01/16/2012  . Osteoarthritis of both knees 01/16/2012  . Hypercholesteremia 07/18/2011  . Type I diabetes mellitus with complication, uncontrolled ([place]) 09/22/2007  . LEIOMYOMA, UTERUS 01/14/2007  . Morbid obesity ([place]) 07/03/2006  . Former smoker 07/03/2006  . Tension headache 07/03/2006  . HYPERTENSION, BENIGN SYSTEMIC 07/03/2006    Disposition: Home   Discharge Condition: Improved   Discharge Exam:  General: obese female sitting up bed,  Cardiovascular: RRR. No murmurs appreciated.  Respiratory: CTAB. Normal WOB.  Abdomen: +BS, soft, non-distended, diffusely TTP, without rebound or guarding  Extremities: No LE edema.  Brief Hospital Course:  [name] is 55 y.o. with PMH of T2DM, HTN, and chronic back pain who presented with nausea/vomiting/dehydration. She had been discharged from hospital one day prior after hospital stay for similar symptoms which improved rapidly.   Nausea/Vomiting: IVF resuscitation performed during  hospitalization and anti-emetics given. Patient with one documented temperature and mild leukocytosis. UA was negative for infectious processes. CT abdomen was benign. Bimanual exam was performed and was negative for cervical motion tenderness. GC/Chlamydia were negative. Leukocytosis resolved during hospitalization and patient was afebrile for over 48 hours prior to discharge. Suspected diabetic gastroparesis as cause of abdominal pain/nausea/vomiting. IV Reglan was started. GI was consulted. GI assisted with tapering down of Reglan. Patient was taking Reglan PO 5 mg TID at discharge. Diet was advanced throughout hospitalization, which patient tolerated well.   HTN: Patient with h/o of resistant HTN. BPs were initially elevated to 230s/80s. Patient presented similarly at previous recent admission. Was restarted on home amlodipine, coreg, and losartan (dose decreased at last admission). BPs quickly stabilized. Concern at this admission and at previous that patient does not take all of her home blood pressure medications because BPs quickly stabilized with less agents than her home regimen. Clonidine and HCTZ were not resumed during this hospitalization.    Issues for Follow Up:  1. Blood Pressure: Patient discharged with Losartan decreased to 25 mg daily (from 50 mg), Amlodipine 10 mg daily and Coreg 25 mg BID. HCTZ and Clonidine held at discharge. Home Health RN ordered for BP checks at previous admission. Increase losartan and/or add back additional anti-hypertensive agents as needed.  2. CT renal stone study from prior ED visit on 4/1 showed a probable 12 mm calcified left renal artery aneurysm. CT angiography on non-emergent basis recommended for further evaluation. 3. CVD 10 year risk score calculated to be approximately 37%. Discussed importance of taking statin to lower risk. Rx for Atorvastatin given. Discharged with Simvastatin at past hospitalization, but pharmacy recommended Atorvastatin given  Simvastatin's risk of interaction with amlodipine and increased  risk for rhabdomyolysis.  4. Patient to take Reglan 5 mg TID for 3 weeks.  5. HgB A1C elevated to 10.4. Will need close follow up regarding T2DM regimen and CBG control.    Significant Procedures: None   Significant Labs and Imaging:   Recent Labs Lab 08/11/15 0528 08/12/15 0608 08/13/15 0508  WBC 14.8* 11.6* 10.2  HGB 15.9* 13.8 12.9  HCT 48.7* 44.2 41.6  PLT 480* 442* 387    Recent Labs Lab 08/06/15 1659 08/07/15 0406  08/09/15 2021 08/10/15 [id number] 08/10/15 0715 08/10/15 2000 08/11/15 0528 08/13/15 0508  NA 134* 139  < > 138 142  --  140 141 140  K 3.2* 3.1*  < > 3.3* 2.9*  --  3.0* 3.5 3.4*  CL 97* 101  < > 101 100*  --  102 100* 101  CO2 23 26  < > 21* 25  --  27 28 27   GLUCOSE 363* 304*  < > 236* 267*  --  210* 236* 191*  BUN 16 12  < > 6 6  --  9 13 21*  CREATININE 0.89 0.87  < > 0.71 0.77  --  0.77 0.89 1.07*  CALCIUM 9.7 9.2  < > 9.3 9.3  --  8.9 9.2 9.1  MG  --   --   --   --   --  1.6* 2.1  --   --   ALKPHOS 154* 135*  --  129*  --   --   --  131*  --   AST 17 17  --  20  --   --   --  16  --   ALT 12* 16  --  17  --   --   --  15  --   ALBUMIN 3.8 2.9*  --  3.4*  --   --   --  2.8*  --   < > = values in this interval not displayed.  Dg Chest 2 View  08/06/2015  CLINICAL DATA:  Nausea and vomiting 3 days. Some chest pain and dizziness. EXAM: CHEST  2 VIEW COMPARISON:  08/05/2015 FINDINGS: Lungs are adequately inflated without focal consolidation or effusion. Cardiomediastinal silhouette is within normal. Bony structures are within normal. There are prominent overlying soft tissues. IMPRESSION: No active cardiopulmonary disease. Electronically Signed   By: Marin Olp M.D.   On: 08/06/2015 16:52   Ct Abdomen Pelvis W Contrast  08/09/2015  CLINICAL DATA:  Generalized abdominal pain with nausea and vomiting for 4 days. EXAM: CT ABDOMEN AND PELVIS WITH CONTRAST TECHNIQUE: Multidetector CT imaging of  the abdomen and pelvis was performed using the standard protocol following bolus administration of intravenous contrast. CONTRAST:  159mL ISOVUE-300 IOPAMIDOL (ISOVUE-300) INJECTION 61% COMPARISON:  None. FINDINGS: Lower chest:  No acute findings. Hepatobiliary: No masses or other significant abnormality. Prior cholecystectomy noted. No evidence of biliary dilatation. Pancreas: No mass, inflammatory changes, or other significant abnormality. Spleen: Within normal limits in size and appearance. Adrenals/Urinary Tract: No masses identified. Tiny bilateral renal cysts noted. No evidence of hydronephrosis. Stomach/Bowel: No evidence of obstruction, inflammatory process, or abnormal fluid collections. Normal appendix visualized. Vascular/Lymphatic: No pathologically enlarged lymph nodes. No evidence of abdominal aortic aneurysm. 11 mm peripherally calcified left renal artery aneurysm remains stable. Reproductive: Mildly enlarged uterus with several small fibroids shows no significant change. No adnexal mass or free fluid identified. A small amount of fluid and gas is seen within the vagina on today's study, which is  of uncertain etiology and clinical significance. Other: A small periumbilical ventral hernia containing only fat is unchanged. Musculoskeletal:  No suspicious bone lesions identified. IMPRESSION: Small amount of fluid and gas seen within the vagina, which is of uncertain etiology and clinical significance. Clinical correlation is recommended. Stable mildly enlarged fibroid uterus. 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 Stable small periumbilical ventral hernia containing only fat. Electronically Signed   By: Earle Gell M.D.   On: 08/09/2015 18:50   Dg Abd Acute W/chest  08/05/2015  CLINICAL DATA:  Acute onset of nausea.  Initial encounter. EXAM: DG ABDOMEN ACUTE W/ 1V CHEST COMPARISON:  CTA of the chest, and CT of the abdomen and pelvis, performed 01/19/2012 FINDINGS: The lungs are well-aerated.  Minimal left basilar atelectasis is noted. There is no evidence of pleural effusion or pneumothorax. The cardiomediastinal silhouette is borderline normal in size. The visualized bowel gas pattern is unremarkable. Scattered stool and air are seen within the colon; there is no evidence of small bowel dilatation to suggest obstruction. No free intra-abdominal air is identified on the provided decubitus view. Clips are noted within the right upper quadrant, reflecting prior cholecystectomy. No acute osseous abnormalities are seen; the sacroiliac joints are unremarkable in appearance. IMPRESSION: 1. Minimal left basilar atelectasis noted.  Lungs otherwise clear. 2. Unremarkable bowel gas pattern; no free intra-abdominal air seen. Moderate amount of stool noted in the colon. Electronically Signed   By: Garald Balding M.D.   On: 08/05/2015 06:27   Ct Renal Stone Study  08/05/2015  CLINICAL DATA:  Nausea. EXAM: CT ABDOMEN AND PELVIS WITHOUT CONTRAST TECHNIQUE: Multidetector CT imaging of the abdomen and pelvis was performed following the standard protocol without IV contrast. COMPARISON:  CT scan of January 19, 2012. FINDINGS: Visualized lung bases are unremarkable. No significant osseous abnormality is noted. Status post cholecystectomy. No focal abnormality is noted in the liver, spleen or pancreas on these unenhanced images. Adrenal glands appear normal. Small nonobstructive calculus is noted in lower pole collecting system of right kidney. No hydronephrosis or renal obstruction is noted. No ureteral calculi are noted. 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 The appendix appears normal. There is no evidence of bowel obstruction. No abnormal fluid collection is noted. Urinary bladder is mildly distended. Fibroid uterus is noted. Mild fat containing periumbilical hernia is noted. No significant adenopathy is noted. There is no evidence of abdominal aortic aneurysm. IMPRESSION: Small  nonobstructive left renal calculus. No hydronephrosis or renal obstruction is noted. 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 CT angiography on nonemergent basis is recommended for further evaluation. Probable fibroid uterus is noted. Mild fat containing periumbilical hernia. Electronically Signed   By: Marijo Conception, M.D.   On: 08/05/2015 09:04    Results/Tests Pending at Time of Discharge: None   Discharge Medications:    Medication List    ASK your doctor about these medications        aspirin 325 MG tablet  Take 1 tablet (325 mg total) by mouth daily.     carvedilol 25 MG tablet  Commonly known as:  COREG  TAKE ONE TABLET BY MOUTH TWICE DAILY WITH MEALS     cyclobenzaprine 10 MG tablet  Commonly known as:  FLEXERIL  TAKE ONE TABLET BY MOUTH EVERY 8 HOURS AS NEEDED FOR MUSCLE SPASM     EPINEPHrine 0.3 mg/0.3 mL Soaj injection  Commonly known as:  EPIPEN  Inject 0.3 mLs (0.3 mg total)  into the muscle once.     LANTUS 100 UNIT/ML injection  Generic drug:  insulin glargine  INJECT 20 UNITS INTO THE SKIN DAILY     losartan 50 MG tablet  Commonly known as:  COZAAR  Take 0.5 tablets (25 mg total) by mouth daily.     metFORMIN 500 MG tablet  Commonly known as:  GLUCOPHAGE  Take 1 tablet (500 mg total) by mouth 2 (two) times daily with a meal.     ondansetron 4 MG tablet  Commonly known as:  ZOFRAN  Take 1 tablet (4 mg total) by mouth every 8 (eight) hours as needed for nausea or vomiting.     promethazine 25 MG tablet  Commonly known as:  PHENERGAN  TAKE ONE-HALF TO ONE TABLET BY MOUTH 4 TIMES DAILY     simvastatin 40 MG tablet  Commonly known as:  ZOCOR  Take 1 tablet (40 mg total) by mouth daily.     traMADol 50 MG tablet  Commonly known as:  ULTRAM  Take 1 tablet (50 mg total) by mouth every 6 (six) hours as needed for moderate pain.        Discharge Instructions: Please refer to Patient Instructions section of EMR for full details.   Patient was counseled important signs and symptoms that should prompt return to medical care, changes in medications, dietary instructions, activity restrictions, and follow up appointments.   Follow-Up Appointments: Follow-up Information    Follow up with [facility] on 08/21/2015.   Specialty:  Family Medicine   Why:  For Hospital Followup at 9:30 am with Dr. Lucky Rathke information:   [address] [id number] [place] [id number] [place], DO 08/13/2015, 7:54 AM PGY-1, [place]

## 2015-08-13 NOTE — Progress Notes (Signed)
Patient discharge teaching given, including activity, diet, follow-up appoints, and medications. Patient verbalized understanding of all discharge instructions. IV access was d/c'd. Vitals are stable. Skin is intact except as charted in most recent assessments. Pt to be escorted out by NT, to be driven home by family.  Larae Caison, MBA, BSN, RN 

## 2015-08-13 NOTE — Discharge Instructions (Signed)
You were hospitalized for vomiting related to your diabetes (diabetic gastroparesis). Please take the Reglan three times a day for three weeks to treat your diabetic gastroparesis.    For your hypertension, take Losartan 25mg  daily, amlodipine 10 mg daily, and coreg 25 mg twice per day. Your blood pressure was well controlled on this regimen while you were hospitalized. It is important you continue to take these medications at home.   Stop taking Simvastatin and take Atorvastatin instead. This has been prescribed for you.

## 2015-08-14 ENCOUNTER — Inpatient Hospital Stay: Payer: Medicaid Other

## 2015-08-16 ENCOUNTER — Telehealth: Payer: Self-pay | Admitting: *Deleted

## 2015-08-16 NOTE — Telephone Encounter (Signed)
Prior Authorization received from [place] for Losartan 25 mg. PA was approved until 08/15/16.  Approval number: [id number].  Derl Barrow, RN

## 2015-08-17 ENCOUNTER — Telehealth: Payer: Self-pay | Admitting: Internal Medicine

## 2015-08-17 NOTE — Telephone Encounter (Signed)
[facility] called and said that the patient does not want home health. [facility] will not be taking the patient on. jw

## 2015-08-17 NOTE — Telephone Encounter (Signed)
Will forward to MD to make her aware.  Haleigh Desmith,CMA  

## 2015-08-21 ENCOUNTER — Ambulatory Visit (INDEPENDENT_AMBULATORY_CARE_PROVIDER_SITE_OTHER): Payer: Medicaid Other | Admitting: Internal Medicine

## 2015-08-21 ENCOUNTER — Encounter: Payer: Self-pay | Admitting: Internal Medicine

## 2015-08-21 VITALS — BP 124/73 | HR 79 | Temp 98.2°F | Ht 71.0 in | Wt 257.0 lb

## 2015-08-21 DIAGNOSIS — I10 Essential (primary) hypertension: Secondary | ICD-10-CM

## 2015-08-21 DIAGNOSIS — Z09 Encounter for follow-up examination after completed treatment for conditions other than malignant neoplasm: Secondary | ICD-10-CM

## 2015-08-21 DIAGNOSIS — K219 Gastro-esophageal reflux disease without esophagitis: Secondary | ICD-10-CM

## 2015-08-21 DIAGNOSIS — R1115 Cyclical vomiting syndrome unrelated to migraine: Secondary | ICD-10-CM

## 2015-08-21 DIAGNOSIS — R1084 Generalized abdominal pain: Secondary | ICD-10-CM | POA: Diagnosis not present

## 2015-08-21 DIAGNOSIS — IMO0002 Reserved for concepts with insufficient information to code with codable children: Secondary | ICD-10-CM

## 2015-08-21 DIAGNOSIS — K3184 Gastroparesis: Secondary | ICD-10-CM

## 2015-08-21 DIAGNOSIS — E1143 Type 2 diabetes mellitus with diabetic autonomic (poly)neuropathy: Secondary | ICD-10-CM

## 2015-08-21 DIAGNOSIS — I722 Aneurysm of renal artery: Secondary | ICD-10-CM

## 2015-08-21 DIAGNOSIS — E118 Type 2 diabetes mellitus with unspecified complications: Secondary | ICD-10-CM

## 2015-08-21 DIAGNOSIS — Z794 Long term (current) use of insulin: Secondary | ICD-10-CM

## 2015-08-21 DIAGNOSIS — E1165 Type 2 diabetes mellitus with hyperglycemia: Secondary | ICD-10-CM

## 2015-08-21 DIAGNOSIS — G43A1 Cyclical vomiting, intractable: Secondary | ICD-10-CM

## 2015-08-21 MED ORDER — LOSARTAN POTASSIUM 50 MG PO TABS: 25.0000 mg | ORAL_TABLET | Freq: Every day | ORAL | Status: DC

## 2015-08-21 MED ORDER — METFORMIN HCL 500 MG PO TABS: 500.0000 mg | ORAL_TABLET | Freq: Two times a day (BID) | ORAL | Status: DC

## 2015-08-21 MED ORDER — AMLODIPINE BESYLATE 10 MG PO TABS: 10.0000 mg | ORAL_TABLET | Freq: Every day | ORAL | Status: DC

## 2015-08-21 MED ORDER — CARVEDILOL 25 MG PO TABS
25.0000 mg | ORAL_TABLET | Freq: Two times a day (BID) | ORAL | Status: DC
Start: 2015-08-21 — End: 2016-08-21

## 2015-08-21 MED ORDER — ONDANSETRON HCL 4 MG PO TABS: 4.0000 mg | ORAL_TABLET | Freq: Three times a day (TID) | ORAL | Status: DC | PRN

## 2015-08-21 MED ORDER — ATORVASTATIN CALCIUM 20 MG PO TABS: 20.0000 mg | ORAL_TABLET | Freq: Every day | ORAL | Status: DC

## 2015-08-21 MED ORDER — PANTOPRAZOLE SODIUM 40 MG PO TBEC: 40.0000 mg | DELAYED_RELEASE_TABLET | Freq: Every day | ORAL | Status: DC

## 2015-08-21 NOTE — Assessment & Plan Note (Addendum)
Stable and at goal today. Patient has been taking Clonidine and HCTZ which were discontinued at discharge along with her Coreg, Losartan, and Amlodipine. Reports no symptoms of hypotension.  - recommended to only continue Coreg, Losartan and Amlodipine  - recommended to hold Clonidine and HCTZ - advised to check blood pressure at pharmacy and keep record, to call clinic if BP > 140/90 - cautioned about symptoms of hypotension; advised to call clinic if this occurs  - follow up in 4 weeks

## 2015-08-21 NOTE — Progress Notes (Signed)
Patient ID: [name], female   DOB: [birth year], 55 y.o.   MRN: [id number] Date of Visit: 08/21/2015   HPI:  Patient is here for hospital follow up. She was hospitalized from 4/5 to 4/9 for nausea, vomiting, and abdominal pain thought to be due to diabetic gastroparesis. She was instructed to take reglan TID for three weeks, Zofran PRN. Additionally, patient was noted to have a history of resistant HTN. Blood pressures were initially elevated but quickly stabilized once she was restarted on some of her home anti-hypertensive medications. She was discharged with Coreg, Amlodipine, and a decreased dose of Losartan; Clonidine and HCTZ were held because her BP were stable without these medications. There was some concern for noncompliance due to this. Additionally, A1c was noted to be 10.4 on admission; again there was some concern for noncompliance (also noted per chart review).   Vomiting/Abdominal Pain thought to be diabetic gastroparesis- resolved - doing well; has not had vomiting or abdominal pain  - has been taking Reglan as prescribed - has not needed zofran this week; took twice last week  - has never seen GI for evaluation   HTN: - has not taken any medications today because she wanted to wait until visit today; but has been compliant on all other days - still taking clonidine and HCTZ (which were stopped upon discharge) - denies dizziness/lightheadedness    DM2: - taking Lantus 20units in the morning; received education from home health nurse about diet   - CBGs 129 this morning; CBGs usually run 125-135 in the morning - check CBGs in AM and in evening - no hypoglycemia or hypoglycemia symptoms  - has an appointment with eye doctor this month  - declined PNA vaccine today   ROS: See HPI.  [name]:  DM2 HTN  PHYSICAL EXAM: BP 124/73 mmHg  Pulse 79  Temp(Src) 98.2 F (36.8 C) (Oral)  Ht 5\' 11"  (1.803 m)  Wt 257 lb (116.574 kg)  BMI 35.86 kg/m2 GEN: NAD  CV: RRR, no  murmurs, rubs, or gallops PULM: CTAB, normal effort ABD: Soft, nontender, nondistended, NABS, no organomegaly EXTR: No lower extremity edema or calf tenderness  Diabetic Foot Exam - Simple   Simple Foot Form  Diabetic Foot exam was performed with the following findings:  Yes 08/21/2015  5:31 PM  Visual Inspection  No deformities, no ulcerations, no other skin breakdown bilaterally:  Yes  Sensation Testing  Intact to touch and monofilament testing bilaterally:  Yes  Pulse Check  Posterior Tibialis and Dorsalis pulse intact bilaterally:  Yes  Comments      ASSESSMENT/PLAN:  Health maintenance:  - declined PNA vaccine   HYPERTENSION, BENIGN SYSTEMIC Stable and at goal today. Patient has been taking Clonidine and HCTZ which were discontinued at discharge along with her Coreg, Losartan, and Amlodipine. Reports no symptoms of hypotension.  - recommended to only continue Coreg, Losartan and Amlodipine  - recommended to hold Clonidine and HCTZ - advised to check blood pressure at pharmacy and keep record, to call clinic if BP > 140/90 - cautioned about symptoms of hypotension; advised to call clinic if this occurs  - follow up in 4 weeks  Diabetic gastroparesis ([place]) No prior evaluation or study to confirm this diagnosis - referral to GI for evaluation for gastroparesis  Poorly controlled type 2 diabetes mellitus ([name]) Recent A1c not at goal, however recent AM CBGs are well controlled. No hypoglycemia per patient.  - continue Lantus 20 units in the AM - continue  Metformin  - discussed hypoglycemia and plan for hypoglycemia.   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

## 2015-08-21 NOTE — Assessment & Plan Note (Signed)
No prior evaluation or study to confirm this diagnosis - referral to GI for evaluation for gastroparesis

## 2015-08-21 NOTE — Assessment & Plan Note (Signed)
Recent A1c not at goal, however recent AM CBGs are well controlled. No hypoglycemia per patient.  - continue Lantus 20 units in the AM - continue Metformin  - discussed hypoglycemia and plan for hypoglycemia.

## 2015-08-21 NOTE — Assessment & Plan Note (Addendum)
will touch base with vascular regarding specific imaging to order for evaluation vs referral

## 2015-08-21 NOTE — Patient Instructions (Signed)
Thank you for coming in.  Continue to take the blood pressure medications listed on your medication list. Please stop taking the Clonidine and Hydrochlorothiazide for now because I don't want your blood pressure to go down too much.  Please check your blood pressure at the pharmacy about every other day. If your blood pressure is consistently above 140/90, please give [place] a call.   Follow up in about 4 weeks for your blood pressure.   Hypoglycemia Hypoglycemia occurs when the glucose in your blood is too low. Glucose is a type of sugar that is your body's main energy source. Hormones, such as insulin and glucagon, control the level of glucose in the blood. Insulin lowers blood glucose and glucagon increases blood glucose. Having too much insulin in your blood stream, or not eating enough food containing sugar, can result in hypoglycemia. Hypoglycemia can happen to people with or without diabetes. It can develop quickly and can be a medical emergency.  CAUSES   Missing or delaying meals.  Not eating enough carbohydrates at meals.  Taking too much diabetes medicine.  Not timing your oral diabetes medicine or insulin doses with meals, snacks, and exercise.  Nausea and vomiting.  Certain medicines.  Severe illnesses, such as hepatitis, kidney disorders, and certain eating disorders.  Increased activity or exercise without eating something extra or adjusting medicines.  Drinking too much alcohol.  A nerve disorder that affects body functions like your heart rate, blood pressure, and digestion (autonomic neuropathy).  A condition where the stomach muscles do not function properly (gastroparesis). Therefore, medicines and food may not absorb properly.  Rarely, a tumor of the pancreas can produce too much insulin. SYMPTOMS   Hunger.  Sweating (diaphoresis).  Change in body temperature.  Shakiness.  Headache.  Anxiety.  Lightheadedness.  Irritability.  Difficulty  concentrating.  Dry mouth.  Tingling or numbness in the hands or feet.  Restless sleep or sleep disturbances.  Altered speech and coordination.  Change in mental status.  Seizures or prolonged convulsions.  Combativeness.  Drowsiness (lethargic).  Weakness.  Increased heart rate or palpitations.  Confusion.  Pale, gray skin color.  Blurred or double vision.  Fainting. DIAGNOSIS  A physical exam and medical history will be performed. Your caregiver may make a diagnosis based on your symptoms. Blood tests and other lab tests may be performed to confirm a diagnosis. Once the diagnosis is made, your caregiver will see if your signs and symptoms go away once your blood glucose is raised.  TREATMENT  Usually, you can easily treat your hypoglycemia when you notice symptoms.  Check your blood glucose. If it is less than 70 mg/dl, take one of the following:   3-4 glucose tablets.    cup juice.    cup regular soda.   1 cup skim milk.   -1 tube of glucose gel.   5-6 hard candies.   Avoid high-fat drinks or food that may delay a rise in blood glucose levels.  Do not take more than the recommended amount of sugary foods, drinks, gel, or tablets. Doing so will cause your blood glucose to go too high.   Wait 10-15 minutes and recheck your blood glucose. If it is still less than 70 mg/dl or below your target range, repeat treatment.   Eat a snack if it is more than 1 hour until your next meal.  There may be a time when your blood glucose may go so low that you are unable to treat yourself at  home when you start to notice symptoms. You may need someone to help you. You may even faint or be unable to swallow. If you cannot treat yourself, someone will need to bring you to the hospital.  [place]  If you have diabetes, follow your diabetes management plan by:  Taking your medicines as directed.  Following your exercise plan.  Following your meal  plan. Do not skip meals. Eat on time.  Testing your blood glucose regularly. Check your blood glucose before and after exercise. If you exercise longer or different than usual, be sure to check blood glucose more frequently.  Wearing your medical alert jewelry that says you have diabetes.  Identify the cause of your hypoglycemia. Then, develop ways to prevent the recurrence of hypoglycemia.  Do not take a hot bath or shower right after an insulin shot.  Always carry treatment with you. Glucose tablets are the easiest to carry.  If you are going to drink alcohol, drink it only with meals.  Tell friends or family members ways to keep you safe during a seizure. This may include removing hard or sharp objects from the area or turning you on your side.  Maintain a healthy weight. SEEK MEDICAL CARE IF:   You are having problems keeping your blood glucose in your target range.  You are having frequent episodes of hypoglycemia.  You feel you might be having side effects from your medicines.  You are not sure why your blood glucose is dropping so low.  You notice a change in vision or a new problem with your vision. SEEK IMMEDIATE MEDICAL CARE IF:   Confusion develops.  A change in mental status occurs.  The inability to swallow develops.  Fainting occurs.   This information is not intended to replace advice given to you by your health care provider. Make sure you discuss any questions you have with your health care provider.   Document Released: 04/22/2005 Document Revised: 04/27/2013 Document Reviewed: 12/27/2014 Elsevier Interactive Patient Education [employer].

## 2015-08-23 ENCOUNTER — Other Ambulatory Visit: Payer: Self-pay | Admitting: Family Medicine

## 2015-08-31 ENCOUNTER — Telehealth: Payer: Self-pay | Admitting: Internal Medicine

## 2015-08-31 NOTE — Telephone Encounter (Signed)
Covering inbox for Dr. Dallas Schimke:  Printed letter for patient excusing her from work while she was in the hospital. Left letter at front desk for her to pick up.   Smitty Cords, MD [place], PGY-1

## 2015-08-31 NOTE — Telephone Encounter (Signed)
Pt called because she was admitted to the hospital and she needs a letter stating this and for how long so that she can give this to her employer. Please call patient when ready. jw

## 2015-08-31 NOTE — Telephone Encounter (Signed)
Will forward to MD to write this letter and we can print here in the office if needed. [name],CMA

## 2015-09-09 ENCOUNTER — Other Ambulatory Visit: Payer: Self-pay | Admitting: Family Medicine

## 2015-09-14 ENCOUNTER — Other Ambulatory Visit: Payer: Self-pay | Admitting: Family Medicine

## 2015-09-20 ENCOUNTER — Encounter: Payer: Self-pay | Admitting: Internal Medicine

## 2015-09-21 ENCOUNTER — Telehealth: Payer: Self-pay | Admitting: Internal Medicine

## 2015-09-21 ENCOUNTER — Encounter (HOSPITAL_COMMUNITY): Payer: Self-pay | Admitting: *Deleted

## 2015-09-21 ENCOUNTER — Inpatient Hospital Stay (HOSPITAL_COMMUNITY)
Admission: EM | Admit: 2015-09-21 | Discharge: 2015-09-24 | DRG: 392 | Disposition: A | Discharge status: Home / Self Care | Payer: Medicaid Other | Attending: Family Medicine | Admitting: Family Medicine

## 2015-09-21 DIAGNOSIS — E876 Hypokalemia: Secondary | ICD-10-CM | POA: Diagnosis present

## 2015-09-21 DIAGNOSIS — Z7982 Long term (current) use of aspirin: Secondary | ICD-10-CM

## 2015-09-21 DIAGNOSIS — D72829 Elevated white blood cell count, unspecified: Secondary | ICD-10-CM | POA: Diagnosis present

## 2015-09-21 DIAGNOSIS — R109 Unspecified abdominal pain: Secondary | ICD-10-CM | POA: Diagnosis present

## 2015-09-21 DIAGNOSIS — E785 Hyperlipidemia, unspecified: Secondary | ICD-10-CM | POA: Diagnosis present

## 2015-09-21 DIAGNOSIS — E860 Dehydration: Secondary | ICD-10-CM | POA: Diagnosis present

## 2015-09-21 DIAGNOSIS — K219 Gastro-esophageal reflux disease without esophagitis: Principal | ICD-10-CM | POA: Diagnosis present

## 2015-09-21 DIAGNOSIS — Z79899 Other long term (current) drug therapy: Secondary | ICD-10-CM

## 2015-09-21 DIAGNOSIS — R112 Nausea with vomiting, unspecified: Secondary | ICD-10-CM | POA: Diagnosis present

## 2015-09-21 DIAGNOSIS — N179 Acute kidney failure, unspecified: Secondary | ICD-10-CM | POA: Diagnosis present

## 2015-09-21 DIAGNOSIS — R1110 Vomiting, unspecified: Secondary | ICD-10-CM

## 2015-09-21 DIAGNOSIS — E1159 Type 2 diabetes mellitus with other circulatory complications: Secondary | ICD-10-CM | POA: Diagnosis present

## 2015-09-21 DIAGNOSIS — I152 Hypertension secondary to endocrine disorders: Secondary | ICD-10-CM | POA: Diagnosis present

## 2015-09-21 DIAGNOSIS — E1122 Type 2 diabetes mellitus with diabetic chronic kidney disease: Secondary | ICD-10-CM

## 2015-09-21 DIAGNOSIS — I10 Essential (primary) hypertension: Secondary | ICD-10-CM | POA: Diagnosis present

## 2015-09-21 DIAGNOSIS — E119 Type 2 diabetes mellitus without complications: Secondary | ICD-10-CM

## 2015-09-21 DIAGNOSIS — E1165 Type 2 diabetes mellitus with hyperglycemia: Secondary | ICD-10-CM | POA: Diagnosis present

## 2015-09-21 DIAGNOSIS — Z794 Long term (current) use of insulin: Secondary | ICD-10-CM

## 2015-09-21 LAB — CBC
HCT: 45.7 % (ref 36.0–46.0)
Hemoglobin: 14.9 g/dL (ref 12.0–15.0)
MCH: 25.3 pg — ABNORMAL LOW (ref 26.0–34.0)
MCHC: 32.6 g/dL (ref 30.0–36.0)
MCV: 77.5 fL — ABNORMAL LOW (ref 78.0–100.0)
Platelets: 493 10*3/uL — ABNORMAL HIGH (ref 150–400)
RBC: 5.9 MIL/uL — ABNORMAL HIGH (ref 3.87–5.11)
RDW: 15.1 % (ref 11.5–15.5)
WBC: 15.6 10*3/uL — ABNORMAL HIGH (ref 4.0–10.5)

## 2015-09-21 LAB — LIPASE, BLOOD: Lipase: 23 U/L (ref 11–51)

## 2015-09-21 LAB — COMPREHENSIVE METABOLIC PANEL
ALT: 15 U/L (ref 14–54)
AST: 12 U/L — ABNORMAL LOW (ref 15–41)
Albumin: 3.3 g/dL — ABNORMAL LOW (ref 3.5–5.0)
Alkaline Phosphatase: 131 U/L — ABNORMAL HIGH (ref 38–126)
Anion gap: 15 (ref 5–15)
BUN: 32 mg/dL — ABNORMAL HIGH (ref 6–20)
CO2: 25 mmol/L (ref 22–32)
Calcium: 9.8 mg/dL (ref 8.9–10.3)
Chloride: 99 mmol/L — ABNORMAL LOW (ref 101–111)
Creatinine, Ser: 1.64 mg/dL — ABNORMAL HIGH (ref 0.44–1.00)
GFR calc Af Amer: 39 mL/min — ABNORMAL LOW (ref >60)
GFR calc non Af Amer: 34 mL/min — ABNORMAL LOW (ref >60)
Glucose, Bld: 276 mg/dL — ABNORMAL HIGH (ref 65–99)
Potassium: 3.4 mmol/L — ABNORMAL LOW (ref 3.5–5.1)
Sodium: 139 mmol/L (ref 135–145)
Total Bilirubin: 0.8 mg/dL (ref 0.3–1.2)
Total Protein: 8.3 g/dL — ABNORMAL HIGH (ref 6.5–8.1)

## 2015-09-21 LAB — URINALYSIS, ROUTINE W REFLEX MICROSCOPIC
Bilirubin Urine: NEGATIVE
Glucose, UA: 250 mg/dL — AB
Ketones, ur: NEGATIVE mg/dL
Leukocytes, UA: NEGATIVE
Nitrite: NEGATIVE
Protein, ur: >300 mg/dL — AB
Specific Gravity, Urine: 1.025 (ref 1.005–1.030)
pH: 5 (ref 5.0–8.0)

## 2015-09-21 LAB — URINE MICROSCOPIC-ADD ON

## 2015-09-21 MED ORDER — ONDANSETRON 4 MG PO TBDP
4.0000 mg | ORAL_TABLET | Freq: Once | ORAL | Status: AC | PRN
  Administered 2015-09-21: 4 mg via ORAL

## 2015-09-21 MED ORDER — METOPROLOL TARTRATE 5 MG/5ML IV SOLN
5.0000 mg | Freq: Once | INTRAVENOUS | Status: AC
  Administered 2015-09-22: 5 mg via INTRAVENOUS
  Filled 2015-09-21: qty 5

## 2015-09-21 MED ORDER — PROCHLORPERAZINE EDISYLATE 5 MG/ML IJ SOLN
10.0000 mg | Freq: Once | INTRAMUSCULAR | Status: AC
  Administered 2015-09-22: 10 mg via INTRAVENOUS
  Filled 2015-09-21: qty 2

## 2015-09-21 MED ORDER — ONDANSETRON 4 MG PO TBDP
ORAL_TABLET | ORAL | Status: AC
  Filled 2015-09-21: qty 1

## 2015-09-21 MED ORDER — HYDROMORPHONE HCL 1 MG/ML IJ SOLN
1.0000 mg | Freq: Once | INTRAMUSCULAR | Status: AC
  Administered 2015-09-21: 1 mg via INTRAVENOUS
  Filled 2015-09-21: qty 1

## 2015-09-21 MED ORDER — SODIUM CHLORIDE 0.9 % IV BOLUS (SEPSIS)
1000.0000 mL | Freq: Once | INTRAVENOUS | Status: AC
  Administered 2015-09-21: 1000 mL via INTRAVENOUS

## 2015-09-21 MED ORDER — SODIUM CHLORIDE 0.9 % IV SOLN
Freq: Once | INTRAVENOUS | Status: AC
  Administered 2015-09-22: 01:00:00 via INTRAVENOUS

## 2015-09-21 MED ORDER — METOCLOPRAMIDE HCL 5 MG/ML IJ SOLN
5.0000 mg | Freq: Once | INTRAMUSCULAR | Status: AC
  Administered 2015-09-21: 5 mg via INTRAVENOUS
  Filled 2015-09-21: qty 2

## 2015-09-21 NOTE — ED Notes (Signed)
MD at bedside. 

## 2015-09-21 NOTE — Telephone Encounter (Signed)
Patient reports N/V and abdominal pain since Tuesday. Last solid food was on Monday. Subjective fever last night. No blood in vomit. Vomit looks like the water/fluid that she drank. Has not been able to keep down fluids since Tuesday as well. She called the clinic today because symptoms are not getting better. No sick contact. Intermittently light headed. Has not been having as much urine output; about once a day. Patient has a history I recommended that she be evaluated in the ED because I am concerned she is not able to stay hydrated at home.  Patient understood and stated that she will come to the ED.  Of note, patient has a history of diabetic gastroparesis and was recently hospitalized for this (4/5 to 4/9).  She was seen at follow in on 4/17 and was referred to GI. Patient reports she has not heard from GI. Per chart review, the clinic had attempted to call patient but patient has not returned calls to schedule (noted on 09/20/15). I gave patient the number to the GI clinic.

## 2015-09-21 NOTE — ED Notes (Signed)
Pt reports abdominal pain and n/v for several days.

## 2015-09-21 NOTE — H&P (Signed)
[facility] Admission History and Physical Service Pager: [phone]  Patient name: [name] Medical record number: [id number] Date of birth: December 28, 1960 Age: 55 y.o. Gender: female  Primary Care Provider: [name], MD Consultants: None Code Status: Full (per discussion on admission)  Chief Complaint: abdominal pain, nausea, vomiting  Assessment and Plan: [name] is a 55 y.o. female presenting with nausea/vomiting/abdominal pain x 3 days. PMH is significant for Type II DM, HTN, GERD, obesity.  Abdominal pain, nausea, vomiting: Leading differentials include diabetic gastroparesis vs viral gastroenteritis. Patient recently admitted last month with same symptoms which improved after IVF and anti-emetics. She was also started on daily Reglan. She s/p Cholecystectomy so gallbladder would not be causing symptoms. Other less likely differentials include ileus, SBO, or obstruction. Symptoms unlikely infectious in origin as patient is afebrile but she does have a slight leukocytosis of 15.6. Electrolytes stable with slightly low K of 3.4. Patient does not appear dehydrated (moist mucous membranes), though is slightly tachycardic. Physical exam significant for diffuse abdominal tenderness, most prominent in epigastric region and patient is diaphoretic. No succusion splash.    - Admit to [place], attending Dr. Gwendlyn Deutscher - NPO except sips with meds, advance diet as tolerated - NS@150  mL/hr x 12hrs - Tramadol 50 mg q6hr PRN for pain - IV Zofran 4mg  q8hr PRN  - AM BMP, CBC - Schedule home Reglan 5 mg TID - this can be associated with tardive dyskinesia, consider trial of erythromycin if further work up consistent with gastroparesis.  - KUB - Consider gastric emptying study for further gastroparesis work-up and GI consult. Needs EGD to rule out gastric obstruction. Work up can be pursued as outpatient if patient improving symptomatically.  - Still needs colonoscopy  which can be done outpatient  AKI: Cr of 1.64. Baseline appears to be 0.6-0.7 . Likely pre-renal etiology due to dehydration from vomiting.  - IVF as above - Check AM BMP - Hold nephrotoxic agents   ACS R/O: Patient presenting with epigastric pain and is tachycardic and diaphoretic on exam. Although atypical presentation, will need to rule out ACS. 10 year ASCVD risk is 51.6%. - EKG now and in the morning - Trend troponins  Type II DM: Hyperglycemic on admission (Glucose 276). On metformin and Lantus 20U at home. Last A1c was 10.4 on 08/2015. - Hold metformin - Sensitive SSI - Lantus 10U (half home dose) while NPO - CBGs  HTN/HLD: BP up to 197/95 in ED. On losartan 50mg  daily, Coreg 25mg  BID, amlodipine 10 mg daily. Patient has not taken medications since Tuesday because she has not been able to tolerate PO. Received IV labetalol in ED. Has a history of medication noncompliance.  - Continue home Coreg and Amlodipine, hold home Losartan - IV Hydralazine PRN for Systolic BP [id number] and/or Diastolic BP [id number] - Continue home Atorvastatin  L Renal Artery Aneurysm: Incidental finding on CT renal stone study (08/05/15). Stable on repeat CT on 08/09/15 - Monitor outpatient  FEN/GI: NPO, NS@150  mL/hr x12hrs  Prophylaxis: lovenox  Disposition: Admit to [place], attending Dr. Gwendlyn Deutscher  History of Present Illness:  [name] is a 55 y.o. female presenting with [name] is a 55 y.o. female presenting with abdominal pain, nausea, and vomiting.  Symptoms stated 2 days ago, located in epigastric area. Intermittent in nature. No clear precipitating factors. Was admitted twice last month with the same symptoms. Has vomited 5-6 times over the last 24 hours. Emeses looks like stomach  contents. Nonbilious, nonbloody. Has not been able to keep anything down since 2 days ago. No diarrhea. Intermittent fevers. Last BM yesterday. Usually has BM ever 2 days. No sick contacts. No swelling. No shortness of breath. No  NSAID use.   Review Of Systems: Per HPI with the following additions: see HPI Otherwise the remainder of the systems were negative.  Patient Active Problem List   Diagnosis Date Noted  . 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 Generalized abdominal pain   . Diabetic gastroparesis ([place]) 08/09/2015  . Emesis, persistent 08/09/2015  . Accelerated hypertension   . Type 2 diabetes mellitus without complication, without long-term current use of insulin ([place])   . Uncontrollable vomiting   . Dehydration 08/06/2015  . Low back pain 06/09/2014  . Poorly controlled type 2 diabetes mellitus ([place]) 05/11/2014  . Obstructive sleep apnea of adult 01/16/2012  . GERD (gastroesophageal reflux disease) 01/16/2012  . Osteoarthritis of both knees 01/16/2012  . Hypercholesteremia 07/18/2011  . Type I diabetes mellitus with complication, uncontrolled ([place]) 09/22/2007  . LEIOMYOMA, UTERUS 01/14/2007  . Morbid obesity ([place]) 07/03/2006  . Former smoker 07/03/2006  . Tension headache 07/03/2006  . HYPERTENSION, BENIGN SYSTEMIC 07/03/2006    Past Medical History: Past Medical History  Diagnosis Date  . Diabetes mellitus   . Hypertension   . GERD (gastroesophageal reflux disease)   . Nausea   . Hyperlipidemia     Past Surgical History: Past Surgical History  Procedure Laterality Date  . Breast surgery      reduction  . Cesarean section      x2  . Cholecystectomy      laparoscopic    Social History: Social History  Substance Use Topics  . Smoking status: Former Smoker -- 10 years    Types: Cigarettes    Quit date: 10/16/2011  . Smokeless tobacco: Never Used     Comment: 1 pack will last a week  . Alcohol Use: No    Please also refer to relevant sections of EMR.  Family History: Family History  Problem Relation Age of Onset  . Hypertension Mother   . Diabetes Mother   . Cancer Father     lung     Allergies and Medications: Allergies  Allergen Reactions   . Lisinopril Other (See Comments)    REACTION: angioedema   No current facility-administered medications on file prior to encounter.   Current Outpatient Prescriptions on File Prior to Encounter  Medication Sig Dispense Refill  . amLODipine (NORVASC) 10 MG tablet Take 1 tablet (10 mg total) by mouth daily. 30 tablet 0  . aspirin 325 MG tablet Take 1 tablet (325 mg total) by mouth daily. 30 tablet 5  . atorvastatin (LIPITOR) 20 MG tablet Take 1 tablet (20 mg total) by mouth daily at 6 PM. 30 tablet 1  . carvedilol (COREG) 25 MG tablet Take 1 tablet (25 mg total) by mouth 2 (two) times daily with a meal. 60 tablet 5  . cyclobenzaprine (FLEXERIL) 10 MG tablet TAKE ONE TABLET BY MOUTH EVERY 8 HOURS AS NEEDED FOR MUSCLE SPASM 30 tablet 0  . EPINEPHrine (EPIPEN) 0.3 mg/0.3 mL SOAJ injection Inject 0.3 mLs (0.3 mg total) into the muscle once. 2 Device 2  . LANTUS 100 UNIT/ML injection INJECT 20 UNITS INTO THE SKIN DAILY 100 vial 3  . losartan (COZAAR) 50 MG tablet Take 0.5 tablets (25 mg total) by mouth daily. 30 tablet 0  . metFORMIN (GLUCOPHAGE) 500  MG tablet Take 1 tablet (500 mg total) by mouth 2 (two) times daily with a meal. 60 tablet 5  . metoCLOPramide (REGLAN) 5 MG tablet Take 1 tablet (5 mg total) by mouth 3 (three) times daily before meals. 63 tablet 0  . ondansetron (ZOFRAN) 4 MG tablet Take 1 tablet (4 mg total) by mouth every 8 (eight) hours as needed for nausea or vomiting. 20 tablet 0  . pantoprazole (PROTONIX) 40 MG tablet Take 1 tablet (40 mg total) by mouth daily. 30 tablet 0    Objective: BP 197/95 mmHg  Pulse 118  Temp(Src) 98.2 F (36.8 C) (Oral)  Resp 18  SpO2 96% Exam: General: Diaphoretic obese female laying in bed, appears to be in pain but pleasant Eyes: PERRLA, non icteric sclera ENTM: moist mucous membranes, false teeth, no pharyngeal erythema Neck: ~3 mm lymph node on left anterior cervical chain, no other lymphadenopathy Cardiovascular: Tachycardic, regular  rhythm, no murmurs. 2+ dorsalis pedis pulse bilaterally, no edema Respiratory: CTAB, no increased work of breathing Abdomen: Obese abdomen, hypoactive bowel sounds, no succusion splash. Diffuse abdominal tenderness to light palpation, most prominent in epigastric region    MSK: normal ROM of all joints Skin: No rashes Neuro: Alert and oriented, 5/5 strength in bilateral upper and lower extremities. Sensation intact throughout Psych: Normal mood and affect  Labs and Imaging: CBC BMET   Recent Labs Lab 09/21/15 1720  WBC 15.6*  HGB 14.9  HCT 45.7  PLT 493*    Recent Labs Lab 09/21/15 1720  NA 139  K 3.4*  CL 99*  CO2 25  BUN 32*  CREATININE 1.64*  GLUCOSE 276*  CALCIUM 9.8     No imaging.  No EKG.   Carlyle Dolly, MD 09/21/2015, 11:57 PM PGY-1, [place] [facility] pager: [phone], text pages welcome  Upper Level Resident Addendum I have read the above note and made revisions highlighted in blue.  Algis Greenhouse. Jerline Pain, [place] Resident PGY-2 09/22/2015 7:07 AM

## 2015-09-21 NOTE — ED Provider Notes (Signed)
CSN: [id number]     Arrival date & time 09/21/15  1710 History   First MD Initiated Contact with Patient 09/21/15 1838     Chief Complaint  Patient presents with  . Abdominal Pain     (Consider location/radiation/quality/duration/timing/severity/associated sxs/prior Treatment) HPI  55 year old female with abdominal pain and nausea/vomiting. Symptoms have been ongoing for several days but significantly worse today to the point where she is unable to keep her medications down. Recent admission for similar type symptoms. Felt to be secondary to diabetic gastroparesis. She is having pain in her epigastrium. Denies any fevers or chills. No diarrhea. No urinary complaints. No sick contacts.  Past Medical History  Diagnosis Date  . Diabetes mellitus   . Hypertension   . GERD (gastroesophageal reflux disease)   . Nausea   . Hyperlipidemia    Past Surgical History  Procedure Laterality Date  . Breast surgery      reduction  . Cesarean section      x2  . Cholecystectomy      laparoscopic   Family History  Problem Relation Age of Onset  . Hypertension Mother   . Diabetes Mother   . Cancer Father     lung   Social History  Substance Use Topics  . Smoking status: Former Smoker -- 10 years    Types: Cigarettes    Quit date: 10/16/2011  . Smokeless tobacco: Never Used     Comment: 1 pack will last a week  . Alcohol Use: No   OB History    No data available     Review of Systems  All systems reviewed and negative, other than as noted in HPI.   Allergies  Lisinopril  Home Medications   Prior to Admission medications   Medication Sig Start Date End Date Taking? Authorizing Provider  amLODipine (NORVASC) 10 MG tablet Take 1 tablet (10 mg total) by mouth daily. 08/21/15  Yes [name], MD  aspirin 325 MG tablet Take 1 tablet (325 mg total) by mouth daily. 05/11/14  Yes Donnamae Jude, MD  atorvastatin (LIPITOR) 20 MG tablet Take 1 tablet (20 mg total) by mouth daily  at 6 PM. 08/21/15  Yes [name], MD  carvedilol (COREG) 25 MG tablet Take 1 tablet (25 mg total) by mouth 2 (two) times daily with a meal. 08/21/15  Yes [name], MD  cyclobenzaprine (FLEXERIL) 10 MG tablet TAKE ONE TABLET BY MOUTH EVERY 8 HOURS AS NEEDED FOR MUSCLE SPASM 09/14/15  Yes Donnamae Jude, MD  EPINEPHrine (EPIPEN) 0.3 mg/0.3 mL SOAJ injection Inject 0.3 mLs (0.3 mg total) into the muscle once. 05/12/13  Yes Waldemar Dickens, MD  LANTUS 100 UNIT/ML injection INJECT 20 UNITS INTO THE SKIN DAILY 07/19/15  Yes Donnamae Jude, MD  losartan (COZAAR) 50 MG tablet Take 0.5 tablets (25 mg total) by mouth daily. 08/21/15  Yes [name], MD  metFORMIN (GLUCOPHAGE) 500 MG tablet Take 1 tablet (500 mg total) by mouth 2 (two) times daily with a meal. 08/21/15  Yes [name], MD  metoCLOPramide (REGLAN) 5 MG tablet Take 1 tablet (5 mg total) by mouth 3 (three) times daily before meals. 08/13/15  Yes Nicolette Bang, DO  ondansetron (ZOFRAN) 4 MG tablet Take 1 tablet (4 mg total) by mouth every 8 (eight) hours as needed for nausea or vomiting. 08/21/15  Yes [name], MD  pantoprazole (PROTONIX) 40 MG tablet Take 1 tablet (40 mg total)  by mouth daily. 08/21/15  Yes [name], MD   BP 197/95 mmHg  Pulse 118  Temp(Src) 98.2 F (36.8 C) (Oral)  Resp 18  SpO2 96% Physical Exam  Constitutional: She appears well-developed.  Sitting up in bed. Actively retching. Appears uncomfortable.  HENT:  Head: Normocephalic and atraumatic.  Eyes: Conjunctivae are normal. Right eye exhibits no discharge. Left eye exhibits no discharge.  Neck: Neck supple.  Cardiovascular: Regular rhythm and normal heart sounds.  Exam reveals no gallop and no friction rub.   No murmur heard. Tachycardic  Pulmonary/Chest: Effort normal and breath sounds normal. No respiratory distress.  Abdominal: Soft. She exhibits no distension. There is tenderness.  Musculoskeletal: She  exhibits no edema or tenderness.  Neurological: She is alert.  Skin: Skin is warm and dry.  Psychiatric: She has a normal mood and affect. Her behavior is normal. Thought content normal.  Nursing note and vitals reviewed.   ED Course  Procedures (including critical care time) Labs Review Labs Reviewed  COMPREHENSIVE METABOLIC PANEL - Abnormal; Notable for the following:    Potassium 3.4 (*)    Chloride 99 (*)    Glucose, Bld 276 (*)    BUN 32 (*)    Creatinine, Ser 1.64 (*)    Total Protein 8.3 (*)    Albumin 3.3 (*)    AST 12 (*)    Alkaline Phosphatase 131 (*)    GFR calc non Af Amer 34 (*)    GFR calc Af Amer 39 (*)    All other components within normal limits  CBC - Abnormal; Notable for the following:    WBC 15.6 (*)    RBC 5.90 (*)    MCV 77.5 (*)    MCH 25.3 (*)    Platelets 493 (*)    All other components within normal limits  URINALYSIS, ROUTINE W REFLEX MICROSCOPIC (NOT AT [facility]) - Abnormal; Notable for the following:    APPearance CLOUDY (*)    Glucose, UA 250 (*)    Hgb urine dipstick SMALL (*)    Protein, ur >300 (*)    All other components within normal limits  URINE MICROSCOPIC-ADD ON - Abnormal; Notable for the following:    Squamous Epithelial / LPF 6-30 (*)    Bacteria, UA FEW (*)    Casts HYALINE CASTS (*)    All other components within normal limits  LIPASE, BLOOD    Imaging Review No results found. I have personally reviewed and evaluated these images and lab results as part of my medical decision-making.   EKG Interpretation None      MDM   Final diagnoses:  Intractable vomiting with nausea, vomiting of unspecified type  AKI (acute kidney injury) ([place])    55 year old female with epigastric pain and nausea and vomiting. Suspect secondary to gastroparesis. She has a history of the same. Continues to be pretty symptomatic. Acute kidney injury. Very hypertensive. She reports that she is unable to hold her home medications found. Will admit  for ongoing treatment.  Virgel Manifold, MD 09/22/15 [phone]

## 2015-09-21 NOTE — Telephone Encounter (Signed)
Patient need to speak with provider regarding n/v and inability to keep anything in her stomach.

## 2015-09-21 NOTE — ED Notes (Signed)
Pt continues to report some low-level nausea, but is still improved compared to first arrival. Appears to be resting comfortably.

## 2015-09-22 ENCOUNTER — Observation Stay (HOSPITAL_COMMUNITY): Payer: Medicaid Other

## 2015-09-22 ENCOUNTER — Other Ambulatory Visit: Payer: Self-pay

## 2015-09-22 DIAGNOSIS — R1013 Epigastric pain: Secondary | ICD-10-CM | POA: Diagnosis not present

## 2015-09-22 DIAGNOSIS — E1143 Type 2 diabetes mellitus with diabetic autonomic (poly)neuropathy: Secondary | ICD-10-CM | POA: Diagnosis not present

## 2015-09-22 DIAGNOSIS — I152 Hypertension secondary to endocrine disorders: Secondary | ICD-10-CM | POA: Diagnosis present

## 2015-09-22 DIAGNOSIS — E860 Dehydration: Secondary | ICD-10-CM | POA: Diagnosis not present

## 2015-09-22 DIAGNOSIS — Z794 Long term (current) use of insulin: Secondary | ICD-10-CM

## 2015-09-22 DIAGNOSIS — E785 Hyperlipidemia, unspecified: Secondary | ICD-10-CM | POA: Diagnosis present

## 2015-09-22 DIAGNOSIS — R1110 Vomiting, unspecified: Secondary | ICD-10-CM | POA: Diagnosis present

## 2015-09-22 DIAGNOSIS — Z7982 Long term (current) use of aspirin: Secondary | ICD-10-CM | POA: Diagnosis not present

## 2015-09-22 DIAGNOSIS — E1165 Type 2 diabetes mellitus with hyperglycemia: Secondary | ICD-10-CM | POA: Diagnosis not present

## 2015-09-22 DIAGNOSIS — E876 Hypokalemia: Secondary | ICD-10-CM | POA: Diagnosis present

## 2015-09-22 DIAGNOSIS — K219 Gastro-esophageal reflux disease without esophagitis: Principal | ICD-10-CM

## 2015-09-22 DIAGNOSIS — E118 Type 2 diabetes mellitus with unspecified complications: Secondary | ICD-10-CM | POA: Insufficient documentation

## 2015-09-22 DIAGNOSIS — R1084 Generalized abdominal pain: Secondary | ICD-10-CM | POA: Diagnosis not present

## 2015-09-22 DIAGNOSIS — N179 Acute kidney failure, unspecified: Secondary | ICD-10-CM

## 2015-09-22 DIAGNOSIS — K3184 Gastroparesis: Secondary | ICD-10-CM | POA: Diagnosis not present

## 2015-09-22 DIAGNOSIS — Z79899 Other long term (current) drug therapy: Secondary | ICD-10-CM | POA: Diagnosis not present

## 2015-09-22 DIAGNOSIS — R109 Unspecified abdominal pain: Secondary | ICD-10-CM | POA: Diagnosis present

## 2015-09-22 DIAGNOSIS — D72829 Elevated white blood cell count, unspecified: Secondary | ICD-10-CM | POA: Diagnosis present

## 2015-09-22 DIAGNOSIS — I10 Essential (primary) hypertension: Secondary | ICD-10-CM

## 2015-09-22 DIAGNOSIS — R112 Nausea with vomiting, unspecified: Secondary | ICD-10-CM | POA: Diagnosis present

## 2015-09-22 LAB — CREATININE, SERUM
CREATININE: 1.05 mg/dL — AB (ref 0.44–1.00)
GFR calc Af Amer: >60 mL/min (ref >60)
GFR, EST NON AFRICAN AMERICAN: 58 mL/min — AB (ref >60)

## 2015-09-22 LAB — CBC
HCT: 43.2 % (ref 36.0–46.0)
HCT: 42.6 % (ref 36.0–46.0)
HEMOGLOBIN: 14 g/dL (ref 12.0–15.0)
Hemoglobin: 13.8 g/dL (ref 12.0–15.0)
MCH: 25.1 pg — ABNORMAL LOW (ref 26.0–34.0)
MCH: 25.1 pg — AB (ref 26.0–34.0)
MCHC: 32.4 g/dL (ref 30.0–36.0)
MCHC: 32.4 g/dL (ref 30.0–36.0)
MCV: 77.6 fL — ABNORMAL LOW (ref 78.0–100.0)
MCV: 77.5 fL — ABNORMAL LOW (ref 78.0–100.0)
PLATELETS: 499 10*3/uL — AB (ref 150–400)
PLATELETS: 496 10*3/uL — AB (ref 150–400)
RBC: 5.57 MIL/uL — AB (ref 3.87–5.11)
RBC: 5.5 MIL/uL — AB (ref 3.87–5.11)
RDW: 15.1 % (ref 11.5–15.5)
RDW: 15 % (ref 11.5–15.5)
WBC: 15 10*3/uL — ABNORMAL HIGH (ref 4.0–10.5)
WBC: 14 10*3/uL — ABNORMAL HIGH (ref 4.0–10.5)

## 2015-09-22 LAB — GLUCOSE, CAPILLARY
GLUCOSE-CAPILLARY: 220 mg/dL — AB (ref 65–99)
Glucose-Capillary: 219 mg/dL — ABNORMAL HIGH (ref 65–99)
Glucose-Capillary: 178 mg/dL — ABNORMAL HIGH (ref 65–99)
Glucose-Capillary: 177 mg/dL — ABNORMAL HIGH (ref 65–99)
Glucose-Capillary: 135 mg/dL — ABNORMAL HIGH (ref 65–99)

## 2015-09-22 LAB — BASIC METABOLIC PANEL
ANION GAP: 14 (ref 5–15)
BUN: 20 mg/dL (ref 6–20)
CALCIUM: 9.3 mg/dL (ref 8.9–10.3)
CHLORIDE: 102 mmol/L (ref 101–111)
CO2: 26 mmol/L (ref 22–32)
Creatinine, Ser: 0.91 mg/dL (ref 0.44–1.00)
GFR calc non Af Amer: >60 mL/min (ref >60)
Glucose, Bld: 208 mg/dL — ABNORMAL HIGH (ref 65–99)
Potassium: 3 mmol/L — ABNORMAL LOW (ref 3.5–5.1)
Sodium: 142 mmol/L (ref 135–145)

## 2015-09-22 LAB — TROPONIN I
TROPONIN I: 0.03 ng/mL (ref <0.031)
TROPONIN I: 0.03 ng/mL (ref <0.031)
Troponin I: 0.05 ng/mL — ABNORMAL HIGH (ref <0.031)

## 2015-09-22 IMAGING — CR DG ABDOMEN 1V
2 series · 2 of 2 positions shown · non-contrast
Comparison: [DATE] and [DATE]

CLINICAL DATA: Abdominal pain, vomiting for 1 day

EXAM:
ABDOMEN - 1 VIEW

[abdomen kub (1 of 2)]
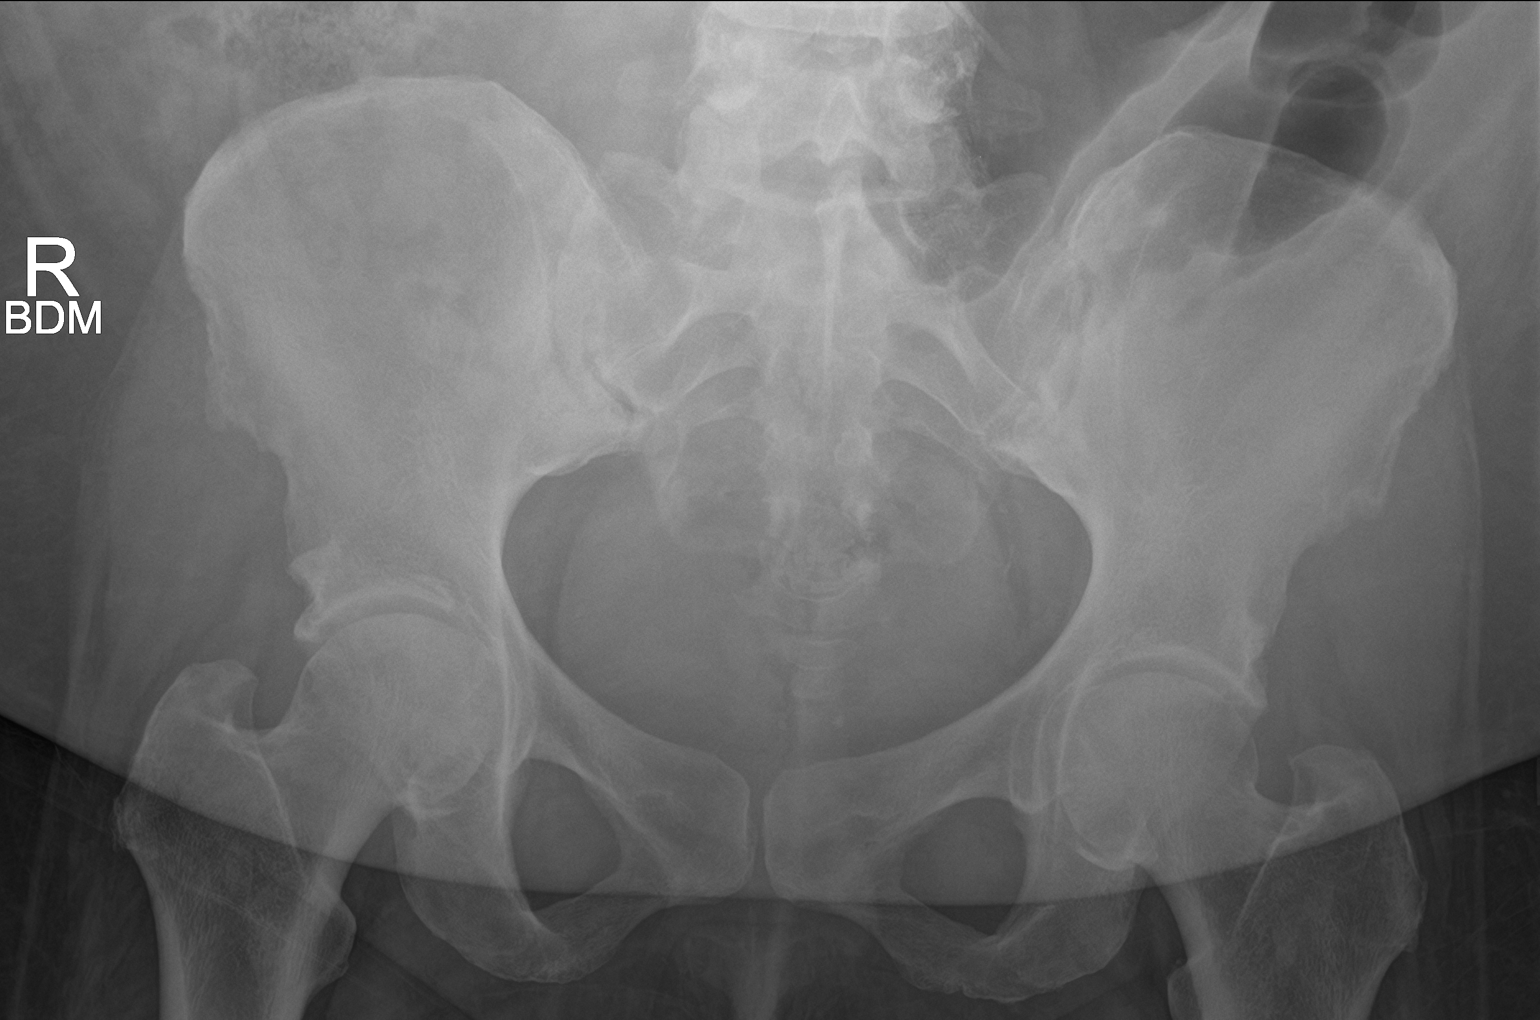

[abdomen kub (2 of 2)]
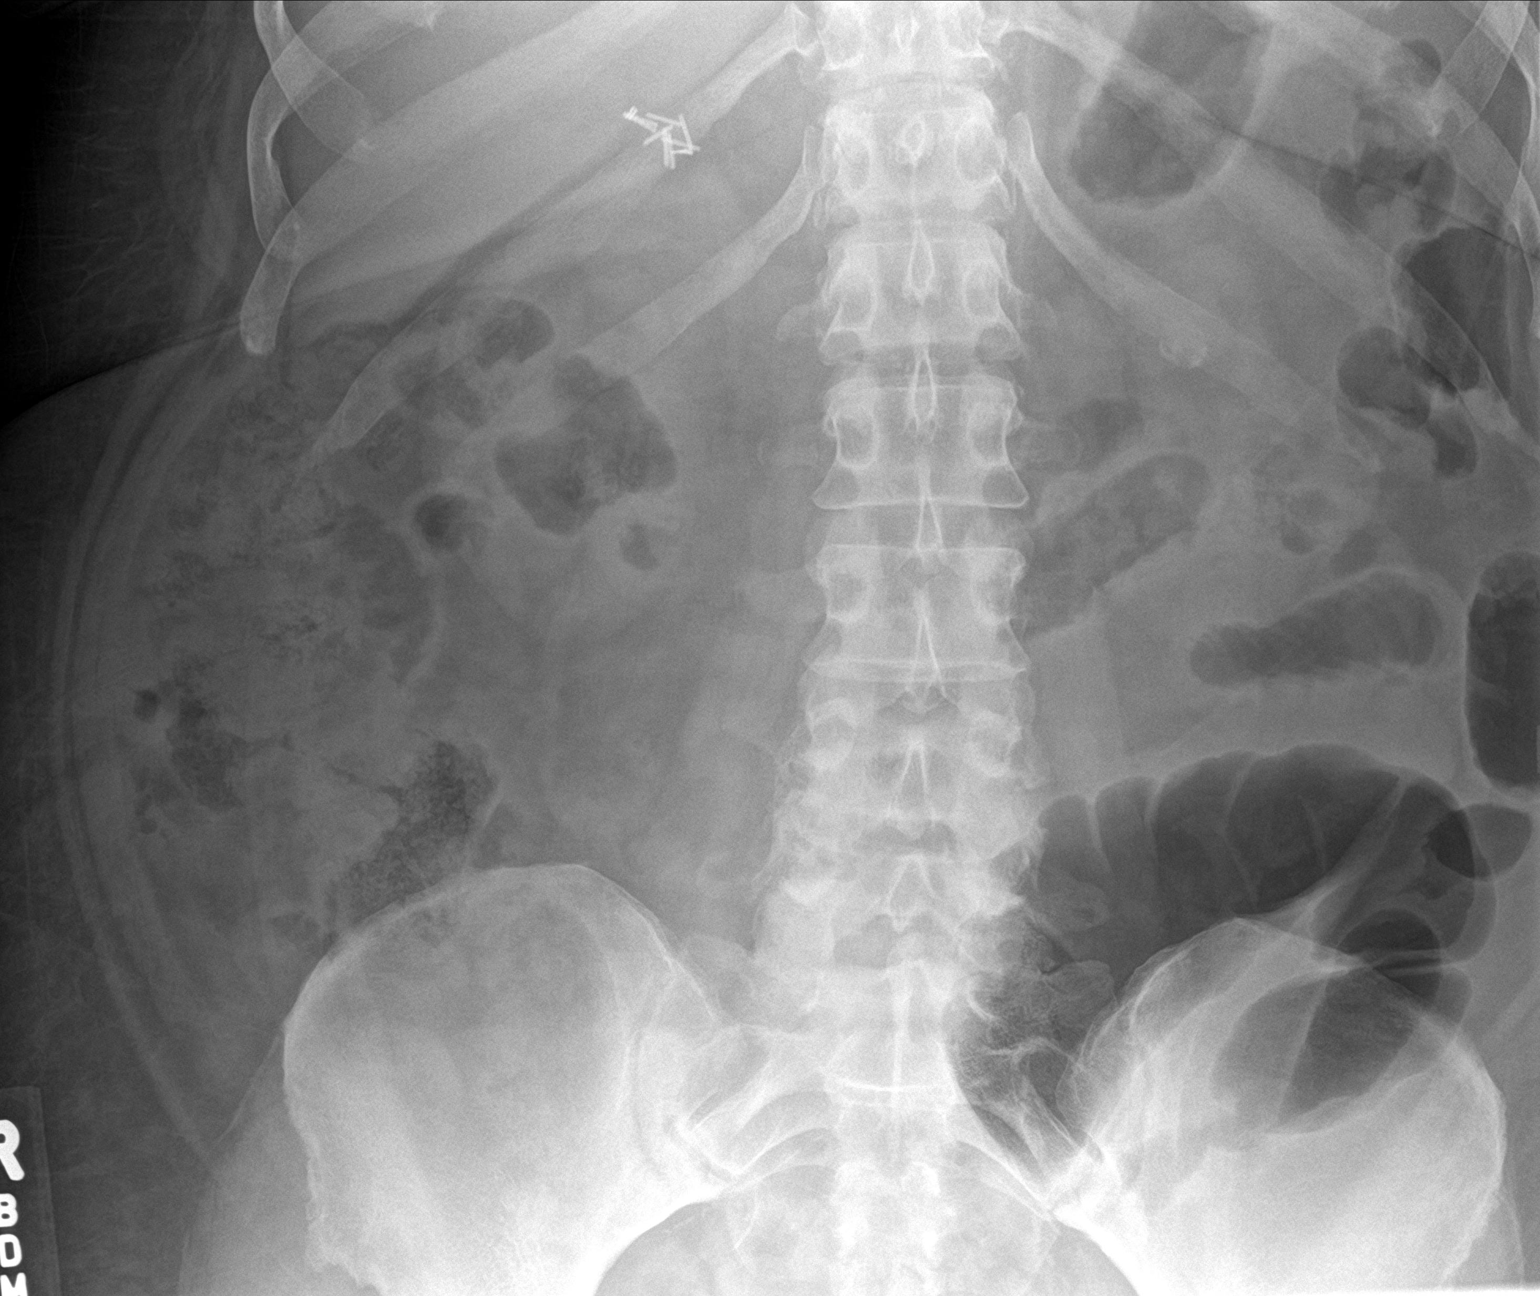

[2 of 2 positions shown; findings below may reference images not displayed]

FINDINGS: Minimal gaseous distended bowel loops in left mid abdomen suspicious
for ileus or enteritis. Postcholecystectomy surgical clips are
noted. Moderate stool noted in right colon. Moderate gaseous
distension mid sigmoid colon.
IMPRESSION: Minimal gaseous distended small bowel loops in left abdomen
suspicious for ileus or enteritis. Moderate colonic stool in right
colon. Moderate gas in mid sigmoid colon.

## 2015-09-22 MED ORDER — SODIUM CHLORIDE 0.9 % IV SOLN
INTRAVENOUS | Status: DC
  Administered 2015-09-22 (×2): via INTRAVENOUS

## 2015-09-22 MED ORDER — AMLODIPINE BESYLATE 10 MG PO TABS
10.0000 mg | ORAL_TABLET | Freq: Every day | ORAL | Status: DC
  Administered 2015-09-22 – 2015-09-24 (×3): 10 mg via ORAL
  Filled 2015-09-22 (×3): qty 1

## 2015-09-22 MED ORDER — METOCLOPRAMIDE HCL 5 MG/ML IJ SOLN
5.0000 mg | Freq: Three times a day (TID) | INTRAMUSCULAR | Status: DC
  Administered 2015-09-22 – 2015-09-24 (×7): 5 mg via INTRAVENOUS
  Filled 2015-09-22 (×7): qty 2

## 2015-09-22 MED ORDER — POLYETHYLENE GLYCOL 3350 17 G PO PACK: 17.0000 g | PACK | Freq: Every day | ORAL | Status: DC | PRN

## 2015-09-22 MED ORDER — LOSARTAN POTASSIUM 50 MG PO TABS: 25.0000 mg | ORAL_TABLET | Freq: Every day | ORAL | Status: DC

## 2015-09-22 MED ORDER — HYDRALAZINE HCL 20 MG/ML IJ SOLN
10.0000 mg | INTRAMUSCULAR | Status: DC | PRN
Start: 2015-09-22 — End: 2015-09-24
  Administered 2015-09-23: 10 mg via INTRAVENOUS
  Filled 2015-09-22: qty 1

## 2015-09-22 MED ORDER — POTASSIUM CHLORIDE CRYS ER 20 MEQ PO TBCR
40.0000 meq | EXTENDED_RELEASE_TABLET | Freq: Once | ORAL | Status: AC
  Administered 2015-09-22: 40 meq via ORAL
  Filled 2015-09-22: qty 2

## 2015-09-22 MED ORDER — SUCRALFATE 1 GM/10ML PO SUSP
1.0000 g | Freq: Three times a day (TID) | ORAL | Status: DC
  Administered 2015-09-22 – 2015-09-24 (×8): 1 g via ORAL
  Filled 2015-09-22 (×8): qty 10

## 2015-09-22 MED ORDER — ONDANSETRON HCL 4 MG PO TABS: 4.0000 mg | ORAL_TABLET | Freq: Four times a day (QID) | ORAL | Status: DC | PRN

## 2015-09-22 MED ORDER — EPINEPHRINE 0.3 MG/0.3ML IJ SOAJ: 0.3000 mg | Freq: Once | INTRAMUSCULAR | Status: DC

## 2015-09-22 MED ORDER — SODIUM CHLORIDE 0.9 % IV SOLN
INTRAVENOUS | Status: DC
  Administered 2015-09-22 (×2): via INTRAVENOUS
  Administered 2015-09-23: 1000 mL via INTRAVENOUS

## 2015-09-22 MED ORDER — ACETAMINOPHEN 650 MG RE SUPP: 650.0000 mg | Freq: Four times a day (QID) | RECTAL | Status: DC | PRN

## 2015-09-22 MED ORDER — CARVEDILOL 25 MG PO TABS
25.0000 mg | ORAL_TABLET | Freq: Two times a day (BID) | ORAL | Status: DC
Start: 2015-09-22 — End: 2015-09-24
  Administered 2015-09-22 – 2015-09-24 (×5): 25 mg via ORAL
  Filled 2015-09-22 (×5): qty 1

## 2015-09-22 MED ORDER — LOSARTAN POTASSIUM 50 MG PO TABS
25.0000 mg | ORAL_TABLET | Freq: Every day | ORAL | Status: DC
  Administered 2015-09-22 – 2015-09-24 (×3): 25 mg via ORAL
  Filled 2015-09-22 (×3): qty 1

## 2015-09-22 MED ORDER — ATORVASTATIN CALCIUM 20 MG PO TABS
20.0000 mg | ORAL_TABLET | Freq: Every day | ORAL | Status: DC
  Administered 2015-09-22 – 2015-09-23 (×2): 20 mg via ORAL
  Filled 2015-09-22 (×2): qty 1

## 2015-09-22 MED ORDER — PANTOPRAZOLE SODIUM 40 MG PO TBEC
40.0000 mg | DELAYED_RELEASE_TABLET | Freq: Every day | ORAL | Status: DC
  Administered 2015-09-22 – 2015-09-24 (×3): 40 mg via ORAL
  Filled 2015-09-22 (×3): qty 1

## 2015-09-22 MED ORDER — INSULIN GLARGINE 100 UNIT/ML ~~LOC~~ SOLN
10.0000 [IU] | Freq: Every day | SUBCUTANEOUS | Status: DC
Start: 2015-09-22 — End: 2015-09-24
  Administered 2015-09-22 – 2015-09-24 (×3): 10 [IU] via SUBCUTANEOUS
  Filled 2015-09-22 (×3): qty 0.1

## 2015-09-22 MED ORDER — INSULIN ASPART 100 UNIT/ML ~~LOC~~ SOLN
0.0000 [IU] | Freq: Three times a day (TID) | SUBCUTANEOUS | Status: DC
  Administered 2015-09-22: 3 [IU] via SUBCUTANEOUS
  Administered 2015-09-22 (×2): 2 [IU] via SUBCUTANEOUS
  Administered 2015-09-23: 1 [IU] via SUBCUTANEOUS
  Administered 2015-09-23 – 2015-09-24 (×4): 2 [IU] via SUBCUTANEOUS

## 2015-09-22 MED ORDER — ACETAMINOPHEN 325 MG PO TABS: 650.0000 mg | ORAL_TABLET | Freq: Four times a day (QID) | ORAL | Status: DC | PRN

## 2015-09-22 MED ORDER — ASPIRIN 325 MG PO TABS
325.0000 mg | ORAL_TABLET | Freq: Every day | ORAL | Status: DC
  Administered 2015-09-22 – 2015-09-24 (×3): 325 mg via ORAL
  Filled 2015-09-22 (×3): qty 1

## 2015-09-22 MED ORDER — ENOXAPARIN SODIUM 40 MG/0.4ML ~~LOC~~ SOLN
40.0000 mg | SUBCUTANEOUS | Status: DC
  Administered 2015-09-22 – 2015-09-24 (×3): 40 mg via SUBCUTANEOUS
  Filled 2015-09-22 (×3): qty 0.4

## 2015-09-22 MED ORDER — TRAMADOL HCL 50 MG PO TABS
50.0000 mg | ORAL_TABLET | Freq: Four times a day (QID) | ORAL | Status: DC | PRN
Start: 2015-09-22 — End: 2015-09-24
  Administered 2015-09-22 – 2015-09-24 (×6): 50 mg via ORAL
  Filled 2015-09-22 (×7): qty 1

## 2015-09-22 MED ORDER — ONDANSETRON HCL 4 MG/2ML IJ SOLN: 4.0000 mg | Freq: Four times a day (QID) | INTRAMUSCULAR | Status: DC | PRN

## 2015-09-22 NOTE — Progress Notes (Signed)
Inpatient Diabetes Program Recommendations  AACE/ADA: New Consensus Statement on Inpatient Glycemic Control (2015)  Target Ranges:  Prepandial:   less than 140 mg/dL      Peak postprandial:   less than 180 mg/dL (1-2 hours)      Critically ill patients:  140 - 180 mg/dL   Review of Glycemic Control:  Results for FRANKIE, LUMBRA (MRN [id number]) as of 09/22/2015 12:31  Ref. Range 09/22/2015 01:47 09/22/2015 07:40 09/22/2015 11:49  Glucose-Capillary Latest Ref Range: 65-99 mg/dL 219 (H) 220 (H) 178 (H)   Diabetes history: Type 2 diabetes Outpatient Diabetes medications:  Lantus 20 units daily, Metformin 500 mg bid  Current orders for Inpatient glycemic control:  Novolog sensitive tid with meals, Lantus 10 units daily  Inpatient Diabetes Program Recommendations:    Please consider increasing Lantus to 20 units daily.   Thanks, Adah Perl, RN, BC-ADM Inpatient Diabetes Coordinator Pager [phone] (8a-5p)

## 2015-09-22 NOTE — Progress Notes (Signed)
FPTS Interim Progress Note  S: Patient still experiencing some abdominal pain, slightly improved from last night. Nausea has improved. No emesis this morning.   O: BP 180/87 mmHg  Pulse 114  Temp(Src) 98.8 F (37.1 C) (Oral)  Resp 17  Ht 5\' 11"  (1.803 m)  Wt 241 lb 6.5 oz (109.5 kg)  BMI 33.68 kg/m2  SpO2 100%  General: Diaphoretic obese female laying in bed, appears to be in pain but pleasant Abdomen: Obese abdomen, hypoactive bowel sounds. Diffuse abdominal tenderness to light palpation, most prominent in epigastric region  Psych: appropriate mood and affect   Cr 1.05>0.91 K: 3.0 Troponin: 0.05>0.03  EKG: sinus tachycardia, no ST elevation but T wave abnormalities. Will need to repeat EKG as leads likely misplaced   Dg Abd 1 View  09/22/2015  CLINICAL DATA:  Abdominal pain, vomiting for 1 day EXAM: ABDOMEN - 1 VIEW COMPARISON:  08/09/2015 and 08/05/2015 FINDINGS: Minimal gaseous distended bowel loops in left mid abdomen suspicious for ileus or enteritis. Postcholecystectomy surgical clips are noted. Moderate stool noted in right colon. Moderate gaseous distension mid sigmoid colon. IMPRESSION: Minimal gaseous distended small bowel loops in left abdomen suspicious for ileus or enteritis. Moderate colonic stool in right colon. Moderate gas in mid sigmoid colon. Electronically Signed   By: Lahoma Crocker M.D.   On: 09/22/2015 08:35    A/P: [name] is a 55 y.o. female presenting with [name] is a 55 y.o. female presenting with abdominal pain, nausea, and vomiting.  Abdominal pain, nausea, vomiting: Leading differentials include diabetic gastroparesis vs viral gastroenteritis vs ileus. Nausea and vomiting improving with medication, abdominal pain persistant  - NPO except sips with meds, advance diet as tolerated - NS@150  mL/hr  - Tramadol 50 mg q6hr PRN for pain - IV Zofran 4mg  q8hr PRN  - Carafate - PPI - Schedule home Reglan 5 mg TID - Consider gastric emptying  study for further gastroparesis work-up and GI consult outpatient  AKI: Resolved  ACS R/O:  - EKG showing sinus tachycardia, but leads likely on incorrectly - Trend troponins: 0.05>0.03 - Repeat EKG  Type II DM:  - Hold metformin - Sensitive SSI - Lantus 10U (half home dose) while NPO - CBGs  HTN: Still elevated. Most recent BP 180/87 - Continue home Coreg and Amlodipine, and home Losartan - IV Hydralazine PRN for Systolic BP [id number] and/or Diastolic BP [id number]  Carlyle Dolly, MD 09/22/2015, 9:07 AM PGY-1, [place] Medicine Service pager [phone]

## 2015-09-23 DIAGNOSIS — E860 Dehydration: Secondary | ICD-10-CM

## 2015-09-23 DIAGNOSIS — R1084 Generalized abdominal pain: Secondary | ICD-10-CM

## 2015-09-23 LAB — CBC
HCT: 46.2 % — ABNORMAL HIGH (ref 36.0–46.0)
Hemoglobin: 14.4 g/dL (ref 12.0–15.0)
MCH: 24.8 pg — AB (ref 26.0–34.0)
MCHC: 31.2 g/dL (ref 30.0–36.0)
MCV: 79.7 fL (ref 78.0–100.0)
PLATELETS: 460 10*3/uL — AB (ref 150–400)
RBC: 5.8 MIL/uL — ABNORMAL HIGH (ref 3.87–5.11)
RDW: 15.2 % (ref 11.5–15.5)
WBC: 14.3 10*3/uL — ABNORMAL HIGH (ref 4.0–10.5)

## 2015-09-23 LAB — BASIC METABOLIC PANEL
ANION GAP: 14 (ref 5–15)
Anion gap: 10 (ref 5–15)
BUN: 8 mg/dL (ref 6–20)
BUN: 9 mg/dL (ref 6–20)
CALCIUM: 9.3 mg/dL (ref 8.9–10.3)
CHLORIDE: 104 mmol/L (ref 101–111)
CO2: 23 mmol/L (ref 22–32)
CO2: 24 mmol/L (ref 22–32)
CREATININE: 0.64 mg/dL (ref 0.44–1.00)
Calcium: 9.1 mg/dL (ref 8.9–10.3)
Chloride: 105 mmol/L (ref 101–111)
Creatinine, Ser: 0.66 mg/dL (ref 0.44–1.00)
GFR calc Af Amer: >60 mL/min (ref >60)
GFR calc non Af Amer: >60 mL/min (ref >60)
GFR calc non Af Amer: >60 mL/min (ref >60)
GLUCOSE: 159 mg/dL — AB (ref 65–99)
Glucose, Bld: 178 mg/dL — ABNORMAL HIGH (ref 65–99)
POTASSIUM: 2.9 mmol/L — AB (ref 3.5–5.1)
Potassium: 3.4 mmol/L — ABNORMAL LOW (ref 3.5–5.1)
SODIUM: 141 mmol/L (ref 135–145)
Sodium: 139 mmol/L (ref 135–145)

## 2015-09-23 LAB — GLUCOSE, CAPILLARY
GLUCOSE-CAPILLARY: 182 mg/dL — AB (ref 65–99)
GLUCOSE-CAPILLARY: 170 mg/dL — AB (ref 65–99)
Glucose-Capillary: 150 mg/dL — ABNORMAL HIGH (ref 65–99)
Glucose-Capillary: 166 mg/dL — ABNORMAL HIGH (ref 65–99)

## 2015-09-23 MED ORDER — POTASSIUM CHLORIDE 10 MEQ/100ML IV SOLN
10.0000 meq | INTRAVENOUS | Status: AC
  Administered 2015-09-23 (×5): 10 meq via INTRAVENOUS
  Filled 2015-09-23 (×5): qty 100

## 2015-09-23 NOTE — Progress Notes (Signed)
Family Medicine Teaching Service Daily Progress Note Intern Pager: [phone]  Patient name: [name] Medical record number: [id number] Date of birth: 1960-09-03 Age: 55 y.o. Gender: female  Primary Care Provider: [name], MD Consultants: None Code Status: Full (per discussion on admission)  Pt Overview and Major Events to Date:  5/19: Admit to FPTS, AKI resolved 5/20: advance diet to clears  Assessment and Plan: [name] is a 55 y.o. female presenting with nausea/vomiting/abdominal pain x 3 days. PMH is significant for Type II DM, HTN, GERD, obesity.  Abdominal pain, nausea, vomiting: Leading differentials include diabetic gastroparesis vs viral gastroenteritis. Other less likely differentials include ileus, SBO, or obstruction. KUB on 5/19 revealed: Minimal gaseous distended bowel loops in left mid abdomen suspicious for ileus or enteritis. Symptoms unlikely infectious in origin as patient is afebrile but did have a slight leukocytosis of 15.6 on admission. Has been getting IVF since admission. Improved abdominal tenderness, most prominent in epigastric region. Nausea and vomiting resolved. Patient no longer diaphoretic.  - NPO except sips with meds, advance diet as tolerated - Decrease NS @100  mL/hr until PO intake improves - Advance diet to clears now - Carafate - Tramadol 50 mg q6hr PRN for pain - IV Zofran 4mg  q8hr PRN  - AM BMPs - Schedule home Reglan 5 mg TID - this can be associated with tardive dyskinesia, consider trial of erythromycin if further work up consistent with gastroparesis.  - Consider gastric emptying study for further gastroparesis work-up and GI consult. Needs EGD to rule out gastric obstruction. Work up can be pursued as outpatient as patient is improving symptomatically.  - Still needs colonoscopy which can be done outpatient  AKI: Resolved  ACS R/O: Patient presenting with epigastric pain and is tachycardic and diaphoretic on exam. ACS  ruled out. 10 year ASCVD risk is 51.6%.  Type II DM: Hyperglycemic on admission (Glucose 276). On metformin and Lantus 20U at home. Last A1c was 10.4 on 08/2015. - Hold metformin - Sensitive SSI - Lantus 10U (half home dose) while NPO - CBGs  HTN/HLD: Still elevated. 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 Stable on repeat CT on 08/09/15 - Monitor outpatient  FEN/GI: clears, NS@100  mL/hr Prophylaxis: lovenox  Disposition: Continue to monitor for improved abdominal pain, nausea, and vomiting  Subjective:  Patient has improved abdominal pain today, she rates it a 0/10 right now but stated it was 9/10 overnight. Denies any nausea or vomiting since admission. Would like to try eating today.  Objective: Temp:  [98.3 F (36.8 C)-98.8 F (37.1 C)] 98.7 F (37.1 C) (05/20 0538) Pulse Rate:  [101-106] 106 (05/20 0538) Resp:  [18] 18 (05/20 0538) BP: (118-193)/(57-97) 141/57 mmHg (05/20 0625) SpO2:  [100 %] 100 % (05/20 0538) Physical Exam: General: In NAD, laying in bed Cardiovascular: Tachycardic, regular rhythm, no murmurs. 2+ dorsalis pedis pulse bilaterally, no edema Respiratory: CTAB, no increased work of breathing Abdomen: Obese abdomen, hypoactive bowel sounds. Diffuse abdominal tenderness to deep, most prominent in epigastric region   Laboratory:  Recent Labs Lab 09/22/15 0313 09/22/15 0717 09/23/15 0816  WBC 15.0* 14.0* 14.3*  HGB 14.0 13.8 14.4  HCT 43.2 42.6 46.2*  PLT 499* 496* 460*    Recent Labs Lab 09/21/15 1720 09/22/15 0313 09/22/15 0717  NA 139  --  142  K 3.4*  --  3.0*  CL 99*  --  102  CO2 25  --  26  BUN 32*  --  20  CREATININE 1.64* 1.05* 0.91  CALCIUM 9.8  --  9.3  PROT 8.3*  --   --   BILITOT 0.8  --   --   ALKPHOS 131*  --   --    ALT 15  --   --   AST 12*  --   --   GLUCOSE 276*  --  208*    Imaging/Diagnostic Tests: Dg Abd 1 View  09/22/2015  CLINICAL DATA:  Abdominal pain, vomiting for 1 day EXAM: ABDOMEN - 1 VIEW COMPARISON:  08/09/2015 and 08/05/2015 FINDINGS: Minimal gaseous distended bowel loops in left mid abdomen suspicious for ileus or enteritis. Postcholecystectomy surgical clips are noted. Moderate stool noted in right colon. Moderate gaseous distension mid sigmoid colon. IMPRESSION: Minimal gaseous distended small bowel loops in left abdomen suspicious for ileus or enteritis. Moderate colonic stool in right colon. Moderate gas in mid sigmoid colon. Electronically Signed   By: Lahoma Crocker M.D.   On: 09/22/2015 08:35     Carlyle Dolly, MD 09/23/2015, 8:05 AM PGY-1, [place] Intern pager: [phone], text pages welcome

## 2015-09-24 DIAGNOSIS — E119 Type 2 diabetes mellitus without complications: Secondary | ICD-10-CM

## 2015-09-24 LAB — BASIC METABOLIC PANEL
Anion gap: 15 (ref 5–15)
BUN: 10 mg/dL (ref 6–20)
CHLORIDE: 101 mmol/L (ref 101–111)
CO2: 22 mmol/L (ref 22–32)
CREATININE: 0.92 mg/dL (ref 0.44–1.00)
Calcium: 9.4 mg/dL (ref 8.9–10.3)
Glucose, Bld: 185 mg/dL — ABNORMAL HIGH (ref 65–99)
POTASSIUM: 3.2 mmol/L — AB (ref 3.5–5.1)
SODIUM: 138 mmol/L (ref 135–145)

## 2015-09-24 LAB — GLUCOSE, CAPILLARY
GLUCOSE-CAPILLARY: 186 mg/dL — AB (ref 65–99)
Glucose-Capillary: 179 mg/dL — ABNORMAL HIGH (ref 65–99)

## 2015-09-24 MED ORDER — POTASSIUM CHLORIDE CRYS ER 20 MEQ PO TBCR
40.0000 meq | EXTENDED_RELEASE_TABLET | Freq: Two times a day (BID) | ORAL | Status: DC
  Administered 2015-09-24: 40 meq via ORAL
  Filled 2015-09-24: qty 2

## 2015-09-24 MED ORDER — TRAMADOL HCL 50 MG PO TABS: 50.0000 mg | ORAL_TABLET | Freq: Four times a day (QID) | ORAL | Status: DC | PRN

## 2015-09-24 NOTE — Discharge Summary (Signed)
[facility] Discharge Summary  Patient name: [name] Medical record number: [id number] Date of birth: Feb 02, 1961 Age: 55 y.o. Gender: female Date of Admission: 09/21/2015  Date of Discharge: 09/24/15 Admitting Physician: Kinnie Feil, MD  Primary Care Provider: [name], MD Consultants: None  Indication for Hospitalization: nausea, vomiting, abdominal pain  Discharge Diagnoses/Problem List:  Patient Active Problem List   Diagnosis Date Noted  . Nausea and vomiting 09/22/2015  . Abdominal pain 09/22/2015  . AKI (acute kidney injury) ([place])   . Type 2 diabetes mellitus with complication, with long-term current use of insulin ([place])   . Essential hypertension   . Esophageal reflux   . 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 Generalized abdominal pain   . Diabetic gastroparesis ([place]) 08/09/2015  . Emesis, persistent 08/09/2015  . Accelerated hypertension   . Type 2 diabetes mellitus without complication, without long-term current use of insulin ([place])   . Uncontrollable vomiting   . Dehydration 08/06/2015  . Low back pain 06/09/2014  . Poorly controlled type 2 diabetes mellitus ([place]) 05/11/2014  . Obstructive sleep apnea of adult 01/16/2012  . GERD (gastroesophageal reflux disease) 01/16/2012  . Osteoarthritis of both knees 01/16/2012  . Hypercholesteremia 07/18/2011  . Type I diabetes mellitus with complication, uncontrolled ([place]) 09/22/2007  . LEIOMYOMA, UTERUS 01/14/2007  . Morbid obesity ([place]) 07/03/2006  . Former smoker 07/03/2006  . Tension headache 07/03/2006  . HYPERTENSION, BENIGN SYSTEMIC 07/03/2006    Disposition: home  Discharge Condition: stable  Discharge Exam: see previous progress note  Brief Hospital Course:  [name] is a 54 y.o. female presented with nausea/vomiting/abdominal pain x 3 days. PMH is significant for Type II DM, HTN, GERD, obesity.  Abdominal pain, nausea, vomiting:   Patient received IVF hydration, Zofran, and was placed on NPO diet with sips for meds until her nausea and vomiting improved. Her home Reglan was resumed. Her home Lantus was decreased while she was NPO, glucoses remained stable. Additionally, ACS rule out was initiated as she was tachycardic and diaphoretic on exam. EKG was unchanged from baseline and troponins were trended and were negative. She was given Tramadol for pain control and Carafate. Leading differentials included diabetic gastroparesis vs viral gastroenteritis. KUB on 5/19 revealed minimal gaseous distended bowel loops in left mid abdomen suspicious for ileus or enteritis. Symptoms unlikely infectious in origin as patient was afebrile but did have a slight leukocytosis of 15.6 on admission. Diet was advanced as tolerated. Upon discharge, patient was tolerating a diet and ate 100% of her meals. Her abdominal tenderness improved and her nausea and vomiting resolved.  AKI/Hypokalemia:  Patient presented with Cr of 1.64. Likely pre-renal etiology due to dehydration from vomiting. Patient was given IVF rehydration and AKI resolved.Her potassium was also repleted with K-DUR.   Issues for Follow Up:  1. Potential gastroparesis: Patient has been admitted several times for nausea, vomiting, and abdominal pain suspicious for gastroparesis but she has not had an official work up. Will need GI referral.  2. Hypokalemia: Potassium was repleted while inpatient. Will need repeat BMP.  3. Leukocytosis: Repeat CBC at follow up  Significant Procedures: None  Significant Labs and Imaging:   Recent Labs Lab 09/22/15 0313 09/22/15 0717 09/23/15 0816  WBC 15.0* 14.0* 14.3*  HGB 14.0 13.8 14.4  HCT 43.2 42.6 46.2*  PLT 499* 496* 460*    Recent Labs Lab 09/21/15 1720 09/22/15 [id number] 09/22/15 [id number] 09/23/15 0816 09/23/15 1623  09/24/15 0535  NA 139  --  142 141 139 138  K 3.4*  --  3.0* 2.9* 3.4* 3.2*  CL 99*  --  102 104 105 101  CO2 25  --   26 23 24 22   GLUCOSE 276*  --  208* 178* 159* 185*  BUN 32*  --  20 8 9 10   CREATININE 1.64* 1.05* 0.91 0.66 0.64 0.92  CALCIUM 9.8  --  9.3 9.1 9.3 9.4  ALKPHOS 131*  --   --   --   --   --   AST 12*  --   --   --   --   --   ALT 15  --   --   --   --   --   ALBUMIN 3.3*  --   --   --   --   --     Dg Abd 1 View  09/22/2015  CLINICAL DATA:  Abdominal pain, vomiting for 1 day EXAM: ABDOMEN - 1 VIEW COMPARISON:  08/09/2015 and 08/05/2015 FINDINGS: Minimal gaseous distended bowel loops in left mid abdomen suspicious for ileus or enteritis. Postcholecystectomy surgical clips are noted. Moderate stool noted in right colon. Moderate gaseous distension mid sigmoid colon. IMPRESSION: Minimal gaseous distended small bowel loops in left abdomen suspicious for ileus or enteritis. Moderate colonic stool in right colon. Moderate gas in mid sigmoid colon. Electronically Signed   By: Lahoma Crocker M.D.   On: 09/22/2015 08:35    Results/Tests Pending at Time of Discharge: None  Discharge Medications:    Medication List    TAKE these medications        amLODipine 10 MG tablet  Commonly known as:  NORVASC  Take 1 tablet (10 mg total) by mouth daily.     aspirin 325 MG tablet  Take 1 tablet (325 mg total) by mouth daily.     atorvastatin 20 MG tablet  Commonly known as:  LIPITOR  Take 1 tablet (20 mg total) by mouth daily at 6 PM.     carvedilol 25 MG tablet  Commonly known as:  COREG  Take 1 tablet (25 mg total) by mouth 2 (two) times daily with a meal.     cyclobenzaprine 10 MG tablet  Commonly known as:  FLEXERIL  TAKE ONE TABLET BY MOUTH EVERY 8 HOURS AS NEEDED FOR MUSCLE SPASM     EPINEPHrine 0.3 mg/0.3 mL Soaj injection  Commonly known as:  EPIPEN  Inject 0.3 mLs (0.3 mg total) into the muscle once.     LANTUS 100 UNIT/ML injection  Generic drug:  insulin glargine  INJECT 20 UNITS INTO THE SKIN DAILY     losartan 50 MG tablet  Commonly known as:  COZAAR  Take 0.5 tablets (25 mg  total) by mouth daily.     metFORMIN 500 MG tablet  Commonly known as:  GLUCOPHAGE  Take 1 tablet (500 mg total) by mouth 2 (two) times daily with a meal.     metoCLOPramide 5 MG tablet  Commonly known as:  REGLAN  Take 1 tablet (5 mg total) by mouth 3 (three) times daily before meals.     ondansetron 4 MG tablet  Commonly known as:  ZOFRAN  Take 1 tablet (4 mg total) by mouth every 8 (eight) hours as needed for nausea or vomiting.     pantoprazole 40 MG tablet  Commonly known as:  PROTONIX  Take 1 tablet (40 mg total) by mouth daily.  traMADol 50 MG tablet  Commonly known as:  ULTRAM  Take 1 tablet (50 mg total) by mouth every 6 (six) hours as needed for severe pain.        Discharge Instructions: Please refer to Patient Instructions section of EMR for full details.  Patient was counseled important signs and symptoms that should prompt return to medical care, changes in medications, dietary instructions, activity restrictions, and follow up appointments.   Follow-Up Appointments: Follow-up Information    Follow up with Lockie Pares, MD On 09/28/2015.   Specialty:  Family Medicine   Why:  hospital follow up at 2:30 PM   Contact information:   [place] [address] [phone]       Carlyle Dolly, MD 09/24/2015, 10:32 AM PGY-1, [place]

## 2015-09-24 NOTE — Progress Notes (Signed)
Family Medicine Teaching Service Daily Progress Note Intern Pager: [phone]  Patient name: [name] Medical record number: [id number] Date of birth: 1960/07/13 Age: 55 y.o. Gender: female  Primary Care Provider: [name], MD Consultants: None Code Status: Full (per discussion on admission)  Pt Overview and Major Events to Date:  5/19: Admit to FPTS, AKI resolved 5/20: advance diet to clears  Assessment and Plan: [name] is a 55 y.o. female presenting with nausea/vomiting/abdominal pain x 3 days. PMH is significant for Type II DM, HTN, GERD, obesity.  Abdominal pain, nausea, vomiting: Leading differentials include diabetic gastroparesis vs viral gastroenteritis. Other less likely differentials include ileus, SBO, or obstruction. KUB on 5/19 revealed: Minimal gaseous distended bowel loops in left mid abdomen suspicious for ileus or enteritis. Symptoms unlikely infectious in origin as patient is afebrile but did have a slight leukocytosis of 15.6 on admission. Improved abdominal tenderness, most prominent in epigastric region. Nausea and vomiting resolved. - clears, advance diet as tolerated - Stop IVF as patient is tolerating clear liquid diet - Carafate - Tramadol 50 mg q6hr PRN for pain - IV Zofran 4mg  q8hr PRN  - Schedule home Reglan 5 mg TID - this can be associated with tardive dyskinesia, consider trial of erythromycin if further work up consistent with gastroparesis.  - Consider gastric emptying study for further gastroparesis work-up and GI consult. Needs EGD to rule out gastric obstruction. Work up can be pursued as outpatient as patient is improving symptomatically.  - Still needs colonoscopy which can be done outpatient  AKI: Resolved  Hypokalemia: K of 3.2 today - Replete with 40 meq KDUR x 2  ACS R/O: Patient presenting with epigastric pain and is tachycardic and diaphoretic on exam. ACS ruled out. 10 year ASCVD risk is 51.6%.  Type II DM: Glucose  stable (159-182) in last 24 hours. On metformin and Lantus 20U at home. Last A1c was 10.4 on 08/2015. - Hold metformin - Sensitive SSI - Lantus 10U (half home dose) while only tolerating clears - CBGs  HTN/HLD: BP improving. 148/72 this AM  - Continue home Coreg and Amlodipine, and Losartan - IV Hydralazine PRN for Systolic BP [id number] and/or Diastolic BP [id number] - Continue home Atorvastatin  L Renal Artery Aneurysm: Incidental finding on CT renal stone study (08/05/15). Stable on repeat CT on 08/09/15 - Monitor outpatient  FEN/GI: clears, SLIV Prophylaxis: lovenox  Disposition: DC today  Subjective:  Patient has improved abdominal pain today, she rates it a 0/10 right now and states it just feels "sore".  Denies any nausea or vomiting since admission. Is tolerating PO and has a good appetite.   Objective: Temp:  [98.5 F (36.9 C)-98.6 F (37 C)] 98.5 F (36.9 C) (05/21 0450) Pulse Rate:  [101-103] 101 (05/20 2109) Resp:  [18] 18 (05/21 0450) BP: (148-161)/(72-85) 148/72 mmHg (05/21 0450) SpO2:  [100 %] 100 % (05/21 0450) Physical Exam: General: In NAD, laying in bed Cardiovascular: Tachycardic, regular rhythm, no murmurs. 2+ dorsalis pedis pulse bilaterally, no edema Respiratory: CTAB, no increased work of breathing Abdomen: Obese abdomen, hypoactive bowel sounds. Diffuse abdominal tenderness to deep, most prominent in epigastric region   Laboratory:  Recent Labs Lab 09/22/15 0313 09/22/15 0717 09/23/15 0816  WBC 15.0* 14.0* 14.3*  HGB 14.0 13.8 14.4  HCT 43.2 42.6 46.2*  PLT 499* 496* 460*    Recent Labs Lab 09/21/15 1720  09/23/15 0816 09/23/15 1623 09/24/15 0535  NA 139  < > 141 139 138  K 3.4*  < > 2.9* 3.4* 3.2*  CL 99*  < > 104 105 101  CO2 25  < > 23 24 22   BUN 32*  < > 8 9 10   CREATININE 1.64*  < > 0.66 0.64 0.92  CALCIUM 9.8  < > 9.1 9.3 9.4  PROT 8.3*  --   --   --   --   BILITOT 0.8  --   --   --   --   ALKPHOS 131*  --   --   --   --   ALT 15  --    --   --   --   AST 12*  --   --   --   --   GLUCOSE 276*  < > 178* 159* 185*  < > = values in this interval not displayed.  Imaging/Diagnostic Tests: No results found.   Carlyle Dolly, MD 09/24/2015, 6:49 AM PGY-1, [facility] Intern pager: [phone], text pages welcome

## 2015-09-24 NOTE — Discharge Instructions (Signed)
You were admitted to the hospital for abdominal pain, nausea, and vomiting. Your symptoms have improved with IV fluids and pain medication. Your symptoms are probably from gastroparesis (your stomach is not moving as well as it should).   You will need to follow up at the appointment time listed above.    Please take your medications as prescribed.      Gastroparesis Gastroparesis, also called delayed gastric emptying, is a condition in which food takes longer than normal to empty from the stomach. The condition is usually long-lasting (chronic). CAUSES This condition may be caused by:  An endocrine disorder, such as hypothyroidism or diabetes. Diabetes is the most common cause of this condition.  A nervous system disease, such as Parkinson disease or multiple sclerosis.  Cancer, infection, or surgery of the stomach or vagus nerve.  A connective tissue disorder, such as scleroderma.  Certain medicines. In most cases, the cause is not known. RISK FACTORS This condition is more likely to develop in:  People with certain disorders, including endocrine disorders, eating disorders, amyloidosis, and scleroderma.  People with certain diseases, including Parkinson disease or multiple sclerosis.  People with cancer or infection of the stomach or vagus nerve.  People who have had surgery on the stomach or vagus nerve.  People who take certain medicines.  Women. SYMPTOMS Symptoms of this condition include:  An early feeling of fullness when eating.  Nausea.  Weight loss.  Vomiting.  Heartburn.  Abdominal bloating.  Inconsistent blood glucose levels.  Lack of appetite.  Acid from the stomach coming up into the esophagus (gastroesophageal reflux).  Spasms of the stomach. Symptoms may come and go. DIAGNOSIS This condition is diagnosed with tests, such as:  Tests that check how long it takes food to move through the stomach and intestines. These tests include:  Upper  gastrointestinal (GI) series. In this test, X-rays of the intestines are taken after you drink a liquid. The liquid makes the intestines show up better on the X-rays.  Gastric emptying scintigraphy. In this test, scans are taken after you eat food that contains a small amount of radioactive material.  Wireless capsule GI monitoring system. This test involves swallowing a capsule that records information about movement through the stomach.  Gastric manometry. This test measures electrical and muscular activity in the stomach. It is done with a thin tube that is passed down the throat and into the stomach.  Endoscopy. This test checks for abnormalities in the lining of the stomach. It is done with a long, thin tube that is passed down the throat and into the stomach.  An ultrasound. This test can help rule out gallbladder disease or pancreatitis as a cause of your symptoms. It uses sound waves to take pictures of the inside of your body. TREATMENT There is no cure for gastroparesis. This condition may be managed with:  Treatment of the underlying condition causing the gastroparesis.  Lifestyle changes, including exercise and dietary changes. Dietary changes can include:  Changes in what and when you eat.  Eating smaller meals more often.  Eating low-fat foods.  Eating low-fiber forms of high-fiber foods, such as cooked vegetables instead of raw vegetables.  Having liquid foods in place of solid foods. Liquid foods are easier to digest.  Medicines. These may be given to control nausea and vomiting and to stimulate stomach muscles.  Getting food through a feeding tube. This may be done in severe cases.  A gastric neurostimulator. This is a device that is  inserted into the body with surgery. It helps improve stomach emptying and control nausea and vomiting. HOME CARE INSTRUCTIONS  Follow your health care provider's instructions about exercise and diet.  Take medicines only as directed  by your health care provider. SEEK MEDICAL CARE IF:  Your symptoms do not improve with treatment.  You have new symptoms. SEEK IMMEDIATE MEDICAL CARE IF:  You have severe abdominal pain that does not improve with treatment.  You have nausea that does not go away.  You cannot keep fluids down.   This information is not intended to replace advice given to you by your health care provider. Make sure you discuss any questions you have with your health care provider.   Document Released: 04/22/2005 Document Revised: 09/06/2014 Document Reviewed: 04/18/2014 Elsevier Interactive Patient Education [employer].

## 2015-09-28 ENCOUNTER — Encounter: Payer: Self-pay | Admitting: Internal Medicine

## 2015-09-28 ENCOUNTER — Ambulatory Visit (INDEPENDENT_AMBULATORY_CARE_PROVIDER_SITE_OTHER): Payer: Medicaid Other | Admitting: Internal Medicine

## 2015-09-28 VITALS — BP 110/70 | HR 102 | Temp 98.0°F | Ht 71.0 in | Wt 242.0 lb

## 2015-09-28 DIAGNOSIS — E1143 Type 2 diabetes mellitus with diabetic autonomic (poly)neuropathy: Secondary | ICD-10-CM

## 2015-09-28 DIAGNOSIS — R1110 Vomiting, unspecified: Secondary | ICD-10-CM | POA: Diagnosis not present

## 2015-09-28 DIAGNOSIS — K3184 Gastroparesis: Secondary | ICD-10-CM

## 2015-09-28 LAB — CBC
HCT: 39.8 % (ref 35.0–45.0)
HEMOGLOBIN: 12.9 g/dL (ref 11.7–15.5)
MCH: 25.3 pg — AB (ref 27.0–33.0)
MCHC: 32.4 g/dL (ref 32.0–36.0)
MCV: 78 fL — AB (ref 80.0–100.0)
MPV: 10.8 fL (ref 7.5–12.5)
PLATELETS: 378 10*3/uL (ref 140–400)
RBC: 5.1 MIL/uL (ref 3.80–5.10)
RDW: 16 % — ABNORMAL HIGH (ref 11.0–15.0)
WBC: 9.6 10*3/uL (ref 3.8–10.8)

## 2015-09-28 NOTE — Assessment & Plan Note (Addendum)
Recently discharge from hospital due episode of nausea/vomit, 3rd hospitalization for this issues. No formal diagnosis of gastroparesis with emptying study. Therefore, referral to GI for further work up  - Ambulatory referral to Gastroenterology - CBC  - BASIC METABOLIC PANEL- low potassium during hospitalization - Continue metoclopramide 5 mg TID- patient should stop this drug after 12 weeks of treatment due to issues with tardive dyskinesia  - Should stop metoclopramide on June 18th 2017 for a total of 12 weeks duration - Advised patient to look out for movement disorder side effects  - Zofran as needed for nausea

## 2015-09-28 NOTE — Progress Notes (Signed)
   [name] Kerrin Mo, MD Phone: [phone]  Reason For Visit: Hospital follow up  due to nausea/vomiting   # Nausea/Vomiting due to presumed gastroparesis vs viral gastritis  - Has been having attacks of nausea and vomiting for 3 years - 3 episodes which patient come hospital  - Last episode was proceeded by insomnia  - Discharge 5/21 from hospital  - Has been feeling weak following discharge  - No issues with eating, keeps down food and drink - Taking Reglan daily, used Zofran x 2 for two episodes of nausea this week. Improved following Zofran  - Not taking Protonix, no reflux - Patient with decreased sexual desire- occurred after starting Reglan vs increased tiredness/stress from episodes  - Patient without issues sleeping currently  - Denies feeling gulity, or depressed, not anxious about everything.   Past Medical History Reviewed problem list.  Medications- reviewed and updated No additions to family history Social history- patient is a previous smoker, stopped in 2015, smoked for 15 years   Objective: BP 110/70 mmHg  Pulse 102  Temp(Src) 98 F (36.7 C) (Oral)  Ht 5\' 11"  (1.803 m)  Wt 242 lb (109.77 kg)  BMI 33.77 kg/m2  SpO2 98% Gen: NAD, alert, cooperative with exam CV: RRR, good S1/S2, no murmur, radial pulses 2+ Abd: BS present, slight tenderness to palpation of epigastric region, no distention ts Skin: no rashes no lesions  Assessment/Plan: See problem based a/p  Diabetic gastroparesis (HCC) Recently discharge from hospital due episode of nausea/vomit, 3rd hospitalization for this issues. No formal diagnosis of gastroparesis with emptying study. Therefore, referral to GI for further work up  - Ambulatory referral to Gastroenterology - CBC  - BASIC METABOLIC PANEL- low potassium during hospitalization - Continue metoclopramide - patient should stop this drug after 12 weeks of treatment due to issues with tardive dyskinesia  -  Should stop metoclopramide on June 18th 2017 for a total of 12 weeks duration - Advised patient to look out for movement disorder side effects  - Zofran as needed for nausea

## 2015-09-28 NOTE — Patient Instructions (Addendum)
Referred you to GI, please follow up. Continue Reglan as needed. Follow up with your PCP in a month or so. If you have problems come in sooner

## 2015-09-29 LAB — BASIC METABOLIC PANEL WITH GFR
BUN: 33 mg/dL — AB (ref 7–25)
CHLORIDE: 99 mmol/L (ref 98–110)
CO2: 23 mmol/L (ref 20–31)
CREATININE: 1.19 mg/dL — AB (ref 0.50–1.05)
Calcium: 9.4 mg/dL (ref 8.6–10.4)
GFR, Est African American: 59 mL/min — ABNORMAL LOW (ref >=60)
GFR, Est Non African American: 51 mL/min — ABNORMAL LOW (ref >=60)
Glucose, Bld: 182 mg/dL — ABNORMAL HIGH (ref 65–99)
Potassium: 3.4 mmol/L — ABNORMAL LOW (ref 3.5–5.3)
Sodium: 138 mmol/L (ref 135–146)

## 2015-10-04 ENCOUNTER — Other Ambulatory Visit: Payer: Self-pay | Admitting: Family Medicine

## 2015-10-06 ENCOUNTER — Telehealth: Payer: Self-pay | Admitting: Internal Medicine

## 2015-10-06 NOTE — Telephone Encounter (Signed)
I called patient a left message regarding labs. Explained potassium was improving, however kidney function needed to monitored. I asked patient to come back to clinic to get labs recheck in a week and to try to follow up with her primary care physician if possible

## 2015-10-09 ENCOUNTER — Other Ambulatory Visit: Payer: Self-pay | Admitting: *Deleted

## 2015-10-09 DIAGNOSIS — K219 Gastro-esophageal reflux disease without esophagitis: Secondary | ICD-10-CM

## 2015-10-10 MED ORDER — EPINEPHRINE 0.3 MG/0.3ML IJ SOAJ: 0.3000 mg | Freq: Once | INTRAMUSCULAR | Status: DC

## 2015-10-10 MED ORDER — GLUCOSE BLOOD VI STRP: ORAL_STRIP | Status: DC

## 2015-10-10 NOTE — Telephone Encounter (Signed)
Did not refill Prilosec as patient is on Protonix per chart review

## 2015-10-11 ENCOUNTER — Other Ambulatory Visit: Payer: Self-pay | Admitting: Family Medicine

## 2015-10-12 ENCOUNTER — Other Ambulatory Visit: Payer: Self-pay | Admitting: *Deleted

## 2015-10-12 MED ORDER — GLUCOSE BLOOD VI STRP: ORAL_STRIP | Status: DC

## 2015-10-12 MED ORDER — TRAMADOL HCL 50 MG PO TABS: 50.0000 mg | ORAL_TABLET | Freq: Two times a day (BID) | ORAL | Status: DC | PRN

## 2015-10-20 ENCOUNTER — Other Ambulatory Visit: Payer: Self-pay | Admitting: Internal Medicine

## 2015-10-26 ENCOUNTER — Encounter: Payer: Self-pay | Admitting: Physician Assistant

## 2015-10-26 ENCOUNTER — Other Ambulatory Visit: Payer: Self-pay

## 2015-10-26 ENCOUNTER — Ambulatory Visit (INDEPENDENT_AMBULATORY_CARE_PROVIDER_SITE_OTHER): Payer: Medicaid Other | Admitting: Physician Assistant

## 2015-10-26 ENCOUNTER — Other Ambulatory Visit (INDEPENDENT_AMBULATORY_CARE_PROVIDER_SITE_OTHER): Payer: Medicaid Other

## 2015-10-26 VITALS — BP 140/90 | HR 100 | Ht 67.75 in | Wt 241.0 lb

## 2015-10-26 DIAGNOSIS — R112 Nausea with vomiting, unspecified: Secondary | ICD-10-CM | POA: Diagnosis not present

## 2015-10-26 DIAGNOSIS — R1013 Epigastric pain: Secondary | ICD-10-CM

## 2015-10-26 DIAGNOSIS — K3184 Gastroparesis: Secondary | ICD-10-CM

## 2015-10-26 LAB — BASIC METABOLIC PANEL
BUN: 19 mg/dL (ref 6–23)
CO2: 36 mEq/L — ABNORMAL HIGH (ref 19–32)
Calcium: 9.6 mg/dL (ref 8.4–10.5)
Chloride: 102 mEq/L (ref 96–112)
Creatinine, Ser: 0.96 mg/dL (ref 0.40–1.20)
GFR: 77.11 mL/min (ref >60.00)
Glucose, Bld: 221 mg/dL — ABNORMAL HIGH (ref 70–99)
Potassium: 3.1 mEq/L — ABNORMAL LOW (ref 3.5–5.1)
Sodium: 139 mEq/L (ref 135–145)

## 2015-10-26 LAB — CBC WITH DIFFERENTIAL/PLATELET
Basophils Absolute: 0 10*3/uL (ref 0.0–0.1)
Basophils Relative: 0.4 % (ref 0.0–3.0)
Eosinophils Absolute: 0.3 10*3/uL (ref 0.0–0.7)
Eosinophils Relative: 2.8 % (ref 0.0–5.0)
HCT: 39 % (ref 36.0–46.0)
Hemoglobin: 12.7 g/dL (ref 12.0–15.0)
Lymphocytes Relative: 23.5 % (ref 12.0–46.0)
Lymphs Abs: 2.4 10*3/uL (ref 0.7–4.0)
MCHC: 32.5 g/dL (ref 30.0–36.0)
MCV: 78.8 fl (ref 78.0–100.0)
Monocytes Absolute: 0.7 10*3/uL (ref 0.1–1.0)
Monocytes Relative: 6.3 % (ref 3.0–12.0)
Neutro Abs: 7 10*3/uL (ref 1.4–7.7)
Neutrophils Relative %: 67 % (ref 43.0–77.0)
Platelets: 386 10*3/uL (ref 150.0–400.0)
RBC: 4.95 Mil/uL (ref 3.87–5.11)
RDW: 16.3 % — ABNORMAL HIGH (ref 11.5–15.5)
WBC: 10.4 10*3/uL (ref 4.0–10.5)

## 2015-10-26 MED ORDER — METOCLOPRAMIDE HCL 5 MG PO TABS: 5.0000 mg | ORAL_TABLET | Freq: Three times a day (TID) | ORAL | Status: DC

## 2015-10-26 MED ORDER — PANTOPRAZOLE SODIUM 40 MG PO TBEC: 40.0000 mg | DELAYED_RELEASE_TABLET | Freq: Every day | ORAL | Status: DC

## 2015-10-26 MED ORDER — ONDANSETRON HCL 4 MG PO TABS: 4.0000 mg | ORAL_TABLET | Freq: Three times a day (TID) | ORAL | Status: DC | PRN

## 2015-10-26 MED ORDER — POTASSIUM CHLORIDE ER 10 MEQ PO TBCR
10.0000 meq | EXTENDED_RELEASE_TABLET | Freq: Two times a day (BID) | ORAL | Status: DC
Start: 2015-10-26 — End: 2017-05-14

## 2015-10-26 NOTE — Patient Instructions (Signed)
Please go to the basement level to have your labs drawn.  You have been scheduled for an endoscopy. Please follow written instructions given to you at your visit today. If you use inhalers (even only as needed), please bring them with you on the day of your procedure. Your physician has requested that you go to www.startemmi.com and enter the access code given to you at your visit today. This web site gives a general overview about your procedure. However, you should still follow specific instructions given to you by our office regarding your preparation for the procedure.  You have been scheduled for a gastric emptying scan at [facility] on Wednesday 11-15-2015 at 7:30 am. Please arrive at least 15 minutes prior to your appointment for registration. Please make certain not to have anything to eat or drink after midnight the night before your test. Hold all stomach medications (ex:  Protonix, Zofran, phenergan, Reglan) 48 hours prior to your test. If you need to reschedule your appointment, please contact radiology scheduling at [phone]. _____________________________________________________________________ A gastric-emptying study measures how long it takes for food to move through your stomach. There are several ways to measure stomach emptying. In the most common test, you eat food that contains a small amount of radioactive material. A scanner that detects the movement of the radioactive material is placed over your abdomen to monitor the rate at which food leaves your stomach. This test normally takes about 2 hours to complete. _____________________________________________________________________  We are sending refills to  your pharmacy for Protonix 40 mg, Reglan 5 mg and zofran for nausea.

## 2015-10-26 NOTE — Progress Notes (Addendum)
Patient ID: [name], female   DOB: 11-Aug-1960, 55 y.o.   MRN: [id number]   Subjective:    Patient ID: [name], female    DOB: 12-29-1960, 55 y.o.   MRN: [id number]  HPI [name] is a pleasant 55 year old African-American female known to Dr. Hilarie Fredrickson. She had last been seen here in 2013 when she underwent screening colonoscopy which was negative with the exception of mild diverticulosis. She is an insulin-dependent diabetic with history of obesity, GERD, hypertension and hyperlipidemia. She is status post cholecystectomy. She had an admission in mid May 2017 with complaints of nausea vomiting and abdominal pain. She was seen by GI during this admission. It was felt she may have symptoms secondary to gastroparesis versus a gastroenteritis. Undergone CT of the abdomen and pelvis in April 2017 showed her to be status post cholecystectomy she did have uterine fibroids and a small. Umbilical hernia containing only fat. Exline Patient says since discharge from the hospital she had another prolonged episode over the past week and a half with nausea, vomiting on a daily basis and associated weakness. She says whenever she would try to eat she would get some abdominal cramping in the epigastrium and then usually vomit. She denies any heartburn indigestion or dysphagia. No fever or chills. No diarrhea. She says today was the first day she was actually able to eat solid food without difficulty. She has a prescription for Zofran which she has not been taking regularly, is on Protonix 40 daily and just started Reglan 5 mg 3 times a day 2 days ago given to her by primary care.  Review of Systems Pertinent positive and negative review of systems were noted in the above HPI section.  All other review of systems was otherwise negative.  Outpatient Encounter Prescriptions as of 10/26/2015  Medication Sig  . amLODipine (NORVASC) 10 MG tablet Take 1 tablet (10 mg total) by mouth daily.  Marland Kitchen aspirin 325 MG tablet Take 1  tablet (325 mg total) by mouth daily.  Marland Kitchen atorvastatin (LIPITOR) 20 MG tablet Take 1 tablet (20 mg total) by mouth daily at 6 PM.  . carvedilol (COREG) 25 MG tablet Take 1 tablet (25 mg total) by mouth 2 (two) times daily with a meal.  . cyclobenzaprine (FLEXERIL) 10 MG tablet TAKE ONE TABLET BY MOUTH EVERY 8 HOURS AS NEEDED FOR MUSCLE SPASM  . EPINEPHrine 0.3 mg/0.3 mL IJ SOAJ injection Inject 0.3 mLs (0.3 mg total) into the muscle once.  Marland Kitchen glucose blood test strip Use as instructed  . LANTUS 100 UNIT/ML injection INJECT 20 UNITS INTO THE SKIN DAILY  . losartan (COZAAR) 50 MG tablet Take 0.5 tablets (25 mg total) by mouth daily.  . metFORMIN (GLUCOPHAGE) 500 MG tablet Take 1 tablet (500 mg total) by mouth 2 (two) times daily with a meal.  . metoCLOPramide (REGLAN) 5 MG tablet Take 1 tablet (5 mg total) by mouth 3 (three) times daily before meals.  . ondansetron (ZOFRAN) 4 MG tablet Take 1 tablet (4 mg total) by mouth every 8 (eight) hours as needed for nausea or vomiting.  . pantoprazole (PROTONIX) 40 MG tablet Take 1 tablet (40 mg total) by mouth daily.  . traMADol (ULTRAM) 50 MG tablet Take 1 tablet (50 mg total) by mouth every 12 (twelve) hours as needed for severe pain.  Marland Kitchen ULTICARE INSULIN SYRINGE 29G X 1/2" 0.3 ML MISC AS DIRECTED  . [DISCONTINUED] metoCLOPramide (REGLAN) 5 MG tablet Take 1 tablet (5 mg total) by  mouth 3 (three) times daily before meals.  . [DISCONTINUED] ondansetron (ZOFRAN) 4 MG tablet Take 1 tablet (4 mg total) by mouth every 8 (eight) hours as needed for nausea or vomiting.  . [DISCONTINUED] pantoprazole (PROTONIX) 40 MG tablet TAKE ONE TABLET BY MOUTH ONCE DAILY   No facility-administered encounter medications on file as of 10/26/2015.   Allergies  Allergen Reactions  . Lisinopril Other (See Comments)    REACTION: angioedema   Patient Active Problem List   Diagnosis Date Noted  . Nausea and vomiting 09/22/2015  . Abdominal pain 09/22/2015  . AKI (acute kidney  injury) ([place])   . Type 2 diabetes mellitus with complication, with long-term current use of insulin ([place])   . Essential hypertension   . Esophageal reflux   . 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 Generalized abdominal pain   . Diabetic gastroparesis ([place]) 08/09/2015  . Emesis, persistent 08/09/2015  . Accelerated hypertension   . Type 2 diabetes mellitus without complication, without long-term current use of insulin ([place])   . Uncontrollable vomiting   . Dehydration 08/06/2015  . Low back pain 06/09/2014  . Poorly controlled type 2 diabetes mellitus ([place]) 05/11/2014  . Obstructive sleep apnea of adult 01/16/2012  . GERD (gastroesophageal reflux disease) 01/16/2012  . Osteoarthritis of both knees 01/16/2012  . Hypercholesteremia 07/18/2011  . Type I diabetes mellitus with complication, uncontrolled ([place]) 09/22/2007  . LEIOMYOMA, UTERUS 01/14/2007  . Morbid obesity ([place]) 07/03/2006  . Former smoker 07/03/2006  . Tension headache 07/03/2006  . HYPERTENSION, BENIGN SYSTEMIC 07/03/2006   Social History   Social History  . Marital Status: Legally Separated    Spouse Name: N/A  . Number of Children: N/A  . Years of Education: N/A   Occupational History  . Not on file.   Social History Main Topics  . Smoking status: Former Smoker -- 10 years    Types: Cigarettes    Quit date: 10/16/2011  . Smokeless tobacco: Never Used     Comment: 1 pack will last a week  . Alcohol Use: No  . Drug Use: No  . Sexual Activity: No   Other Topics Concern  . Not on file   Social History Narrative    [name]'s family history includes Cancer in her father; Diabetes in her mother; Hypertension in her mother.      Objective:    Filed Vitals:   10/26/15 0901  BP: 140/90  Pulse: 100    Physical Exam  well-developed African-American female in no acute distress, pleasant blood pressure 140/90 pulse 100 Height 5 foot 7 weight 241 BMI of 36.9. HEENT; nontraumatic  normocephalic EOMI PERRLA sclera anicteric, Cardiovascular; regular rate and rhythm with S1-S2 no murmur or gallop, Pulmonary; clear bilaterally, Abdomen; obese soft nontender, she is mildly tender along the costal margins no palpable mass or hepatosplenomegaly bowel sounds present, Rectal; exam not done, Ext;no clubbing cyanosis or edema skin warm and dry, Neuropsych; mood and affect appropriate     Assessment & Plan:   #74  55 year old African-American female, insulin-dependent diabetic with recurrent episodes of nausea vomiting and epigastric discomfort, she is status post cholecystectomy and symptoms frequently associated with early satiety. Suspect diabetic gastroparesis, rule out gastropathy or peptic ulcer disease #2 hypertension  #3 hyperlipidemia   #4 obesity #5 diverticulosis  Plan; reviewed a gastroparesis diet and have asked her to follow step to initially and then step 3 gastroparesis diet Small frequent feedings Advised to take Zofran  4 mg 3 times a day on a scheduled basis for now Continue Reglan 5 mg by mouth one half hour before meals which she just started Continue Protonix 40 mg by mouth every morning Avoid pain medications, she had been taking tramadol for her abdominal pain and was advised to stop this Schedule for 4 hour gastric emptying scan Schedule for upper endoscopy with Dr. Hilarie Fredrickson. Procedure discussed in detail with patient including risks and benefits and she is agreeable to proceed   Alfredia Ferguson PA-C 10/26/2015   Cc: Tonette Bihari, MD  Addendum: Reviewed and agree with initial management. Pt needs to be off of metoclopramide at least 48 hr, preferable 72 hr before GES Jerene Bears, MD

## 2015-10-27 NOTE — Progress Notes (Signed)
Spoke with pt and she knows to hold reglan at least 48 but preferably 72 hours prior to GES.

## 2015-11-01 ENCOUNTER — Telehealth: Payer: Self-pay | Admitting: *Deleted

## 2015-11-01 ENCOUNTER — Other Ambulatory Visit: Payer: Self-pay | Admitting: Family Medicine

## 2015-11-01 NOTE — Telephone Encounter (Signed)
Received from [facility] needing verification if patient should be taking both Reglan and Promethazine.  Please give them a call at [phone]. Derl Barrow, RN

## 2015-11-03 NOTE — Telephone Encounter (Signed)
Called pharmacy to verify medications, specifically Reglan and Promethazine. Per GI note in 6/22, patient is to be on Reglan 5mg  by mouth before meals and Zofran 4mg  TID scheduled. Due to this, recommended deactivating rx for promethazine.

## 2015-11-09 ENCOUNTER — Encounter: Payer: Self-pay | Admitting: Internal Medicine

## 2015-11-09 ENCOUNTER — Ambulatory Visit (AMBULATORY_SURGERY_CENTER): Payer: Medicaid Other | Admitting: Internal Medicine

## 2015-11-09 VITALS — BP 164/82 | HR 92 | Temp 98.6°F | Resp 27 | Ht 67.75 in | Wt 241.0 lb

## 2015-11-09 DIAGNOSIS — R112 Nausea with vomiting, unspecified: Secondary | ICD-10-CM

## 2015-11-09 DIAGNOSIS — K2950 Unspecified chronic gastritis without bleeding: Secondary | ICD-10-CM | POA: Diagnosis not present

## 2015-11-09 LAB — GLUCOSE, CAPILLARY
GLUCOSE-CAPILLARY: 188 mg/dL — AB (ref 65–99)
Glucose-Capillary: 158 mg/dL — ABNORMAL HIGH (ref 65–99)

## 2015-11-09 MED ORDER — SODIUM CHLORIDE 0.9 % IV SOLN: 500.0000 mL | INTRAVENOUS | Status: DC

## 2015-11-09 NOTE — Progress Notes (Signed)
Called to room to assist during endoscopic procedure.  Patient ID and intended procedure confirmed with present staff. Received instructions for my participation in the procedure from the performing physician.  

## 2015-11-09 NOTE — Op Note (Signed)
[facility] Patient Name: [name] Procedure Date: 11/09/2015 11:14 AM MRN: [id number] Endoscopist: Jerene Bears , MD Age: 55 Referring MD:  Date of Birth: Jun 29, 1960 Gender: Female Account #: [id number] Procedure:                Upper GI endoscopy Indications:              Epigastric abdominal pain, Nausea with vomiting Medicines:                Monitored Anesthesia Care Procedure:                Pre-Anesthesia Assessment:                           - Prior to the procedure, a History and Physical                            was performed, and patient medications and                            allergies were reviewed. The patient's tolerance of                            previous anesthesia was also reviewed. The risks                            and benefits of the procedure and the sedation                            options and risks were discussed with the patient.                            All questions were answered, and informed consent                            was obtained. Prior Anticoagulants: The patient has                            taken no previous anticoagulant or antiplatelet                            agents. ASA Grade Assessment: III - A patient with                            severe systemic disease. After reviewing the risks                            and benefits, the patient was deemed in                            satisfactory condition to undergo the procedure.                           After obtaining informed consent, the endoscope was  passed under direct vision. Throughout the                            procedure, the patient's blood pressure, pulse, and                            oxygen saturations were monitored continuously. The                            Model GIF-[id number]) scope was introduced                            through the mouth, and advanced to the second part                            of  duodenum. The upper GI endoscopy was                            accomplished without difficulty. The patient                            tolerated the procedure well. Scope In: Scope Out: Findings:                 The esophagus was normal.                           Normal mucosa was found in the entire examined                            stomach. Biopsies were taken with a cold forceps                            for histology and Helicobacter pylori testing.                           The cardia and gastric fundus were normal on                            retroflexion.                           The examined duodenum was normal. Complications:            No immediate complications. Estimated Blood Loss:     Estimated blood loss was minimal. Impression:               - Normal esophagus.                           - Normal mucosa was found in the entire stomach.                            Biopsied.                           - Normal examined duodenum. Recommendation:           -  Patient has a contact number available for                            emergencies. The signs and symptoms of potential                            delayed complications were discussed with the                            patient. Return to normal activities tomorrow.                            Written discharge instructions were provided to the                            patient.                           - Gastroparesis diet.                           - Continue present medications.                           - Await pathology results. Jerene Bears, MD 11/09/2015 11:34:36 AM This report has been signed electronically.

## 2015-11-09 NOTE — Progress Notes (Signed)
To recovery, report to Scott, RN, VSS 

## 2015-11-09 NOTE — Patient Instructions (Signed)
YOU HAD AN ENDOSCOPIC PROCEDURE TODAY AT [facility]:   Refer to the procedure report that was given to you for any specific questions about what was found during the examination.  If the procedure report does not answer your questions, please call your gastroenterologist to clarify.  If you requested that your care partner not be given the details of your procedure findings, then the procedure report has been included in a sealed envelope for you to review at your convenience later.  YOU SHOULD EXPECT: Some feelings of bloating in the abdomen. Passage of more gas than usual.  Walking can help get rid of the air that was put into your GI tract during the procedure and reduce the bloating. If you had a lower endoscopy (such as a colonoscopy or flexible sigmoidoscopy) you may notice spotting of blood in your stool or on the toilet paper. If you underwent a bowel prep for your procedure, you may not have a normal bowel movement for a few days.  Please Note:  You might notice some irritation and congestion in your nose or some drainage.  This is from the oxygen used during your procedure.  There is no need for concern and it should clear up in a day or so.  SYMPTOMS TO REPORT IMMEDIATELY:   Following upper endoscopy (EGD)  Vomiting of blood or coffee ground material  New chest pain or pain under the shoulder blades  Painful or persistently difficult swallowing  New shortness of breath  Fever of 100F or higher  Black, tarry-looking stools  For urgent or emergent issues, a gastroenterologist can be reached at any hour by calling [phone].   DIET: Your first meal following the procedure should be a small meal and then it is ok to progress to your normal diet. Heavy or fried foods are harder to digest and may make you feel nauseous or bloated.  Likewise, meals heavy in dairy and vegetables can increase bloating.  Drink plenty of fluids but you should avoid alcoholic beverages for  24 hours.  ACTIVITY:  You should plan to take it easy for the rest of today and you should NOT DRIVE or use heavy machinery until tomorrow (because of the sedation medicines used during the test).    FOLLOW UP: Our staff will call the number listed on your records the next business day following your procedure to check on you and address any questions or concerns that you may have regarding the information given to you following your procedure. If we do not reach you, we will leave a message.  However, if you are feeling well and you are not experiencing any problems, there is no need to return our call.  We will assume that you have returned to your regular daily activities without incident.  If any biopsies were taken you will be contacted by phone or by letter within the next 1-3 weeks.  Please call us at [phone] if you have not heard about the biopsies in 3 weeks.    SIGNATURES/CONFIDENTIALITY: You and/or your care partner have signed paperwork which will be entered into your electronic medical record.  These signatures attest to the fact that that the information above on your After Visit Summary has been reviewed and is understood.  Full responsibility of the confidentiality of this discharge information lies with you and/or your care-partner.  Discharge instructions given. Biopsies taken. Resume previous medications.

## 2015-11-10 ENCOUNTER — Telehealth: Payer: Self-pay | Admitting: *Deleted

## 2015-11-10 NOTE — Telephone Encounter (Signed)
Number identifier, left message, follow-up  

## 2015-11-15 ENCOUNTER — Ambulatory Visit (HOSPITAL_COMMUNITY)
Admission: RE | Admit: 2015-11-15 | Discharge: 2015-11-15 | Disposition: A | Discharge status: Home / Self Care | Payer: Medicaid Other | Source: Ambulatory Visit | Attending: Physician Assistant | Admitting: Physician Assistant

## 2015-11-15 DIAGNOSIS — K3184 Gastroparesis: Secondary | ICD-10-CM | POA: Diagnosis not present

## 2015-11-15 DIAGNOSIS — R1013 Epigastric pain: Secondary | ICD-10-CM | POA: Diagnosis not present

## 2015-11-15 DIAGNOSIS — R112 Nausea with vomiting, unspecified: Secondary | ICD-10-CM | POA: Diagnosis present

## 2015-11-15 MED ORDER — TECHNETIUM TC 99M SULFUR COLLOID FILTERED
2.2000 | Freq: Once | INTRAVENOUS | Status: AC | PRN
  Administered 2015-11-15: 2.2 via INTRADERMAL

## 2015-11-20 ENCOUNTER — Encounter: Payer: Self-pay | Admitting: Internal Medicine

## 2015-12-22 ENCOUNTER — Telehealth: Payer: Self-pay | Admitting: Internal Medicine

## 2015-12-22 NOTE — Telephone Encounter (Signed)
Pt stated [facility] at [address] has been unable to fill her Rx of potassium for over month. Please advise. Thanks! ep

## 2015-12-25 NOTE — Telephone Encounter (Signed)
Per chart review, this potassium supplement is not a chronic medication for the patient. She was seen by GI in June and was given potassium for 4 days due to borderline low potassium. She likely needs a repeat lab to re-check her potassium if she has had continued vomiting from her gastroparesis. Essentially, this was not meant to be a chronic medication. Please call patient and let her know.

## 2015-12-26 ENCOUNTER — Other Ambulatory Visit: Payer: Self-pay | Admitting: Family Medicine

## 2015-12-27 ENCOUNTER — Telehealth: Payer: Self-pay | Admitting: *Deleted

## 2015-12-27 NOTE — Telephone Encounter (Signed)
Received fax from [place] requesting to change patient's DM supplies to Accu-chek products.  Rx was sent in for OneTouch, which are covered under Medicaid.  Please send Accu-Chek testing supplies.  Derl Barrow, RN

## 2015-12-28 ENCOUNTER — Other Ambulatory Visit: Payer: Self-pay | Admitting: Internal Medicine

## 2015-12-28 NOTE — Telephone Encounter (Signed)
Patient is aware of message and has an appt with Dr. Brett Albino on 01-01-16. [name],CMA

## 2015-12-28 NOTE — Progress Notes (Signed)
Received request to send in diabetic supplies from [place]. Printed prescriptions and placed on Ms. [name]'s office to fax. Unfortunately I am unable to e-prescribe or call in. Please call patient and let her know.

## 2015-12-29 MED ORDER — ACCU-CHEK AVIVA DEVI: 0 refills | Status: AC

## 2015-12-29 MED ORDER — GLUCOSE BLOOD VI STRP: ORAL_STRIP | 12 refills | Status: DC

## 2015-12-29 MED ORDER — ACCU-CHEK SOFTCLIX LANCETS MISC: 12 refills | Status: DC

## 2015-12-29 MED ORDER — ACCU-CHEK AVIVA DEVI: 0 refills | Status: DC

## 2015-12-29 NOTE — Telephone Encounter (Signed)
Ms. Hassell Done faxed prescriptions to pharmacy

## 2016-01-01 ENCOUNTER — Encounter: Payer: Self-pay | Admitting: Internal Medicine

## 2016-01-01 ENCOUNTER — Ambulatory Visit (INDEPENDENT_AMBULATORY_CARE_PROVIDER_SITE_OTHER): Payer: Medicaid Other | Admitting: Internal Medicine

## 2016-01-01 VITALS — BP 164/96 | HR 108 | Temp 98.6°F | Wt 266.0 lb

## 2016-01-01 DIAGNOSIS — E876 Hypokalemia: Secondary | ICD-10-CM

## 2016-01-01 DIAGNOSIS — Z111 Encounter for screening for respiratory tuberculosis: Secondary | ICD-10-CM | POA: Insufficient documentation

## 2016-01-01 DIAGNOSIS — Z0000 Encounter for general adult medical examination without abnormal findings: Secondary | ICD-10-CM | POA: Diagnosis not present

## 2016-01-01 LAB — MAGNESIUM: Magnesium: 1.9 mg/dL (ref 1.5–2.5)

## 2016-01-01 LAB — HIV ANTIBODY (ROUTINE TESTING W REFLEX): HIV 1&2 Ab, 4th Generation: NONREACTIVE

## 2016-01-01 LAB — POTASSIUM: Potassium: 4.1 mmol/L (ref 3.5–5.3)

## 2016-01-01 NOTE — Progress Notes (Signed)
[facility] Phone: [phone]  Subjective:  Hypokalemia: Here for recheck of potassium. She was seen by GI in June and was given K-dur for low K. She has taken K-dur 10 mEq once a day x 1 month. She would like her potassium re-checked to determine if she needs to stay on the K supplementation or if she can stop it. It was thought that her hypokalemia was likely secondary to vomiting due to diabetic gastroparesis. She was recently started on Phenergan and Regland and has not had any vomiting since then. No palpitations, no fatigue, no muscle weakness, no tingling/numbness.   ROS: See HPI for pertinent positives and negatives Past Medical History- HTN, T2DM with diabetic gastroparesis, HLD, morbid obesity Reviewed problem list.  Medications- reviewed and updated Current Outpatient Prescriptions  Medication Sig Dispense Refill  . ACCU-CHEK SOFTCLIX LANCETS lancets Use to test blood sugar twice a day.  ICD-10 code E11.8 100 each 12  . amLODipine (NORVASC) 10 MG tablet Take 1 tablet (10 mg total) by mouth daily. 30 tablet 0  . aspirin 325 MG tablet Take 1 tablet (325 mg total) by mouth daily. 30 tablet 5  . atorvastatin (LIPITOR) 20 MG tablet Take 1 tablet (20 mg total) by mouth daily at 6 PM. 30 tablet 1  . Blood Glucose Monitoring Suppl (ACCU-CHEK AVIVA) device Use to test blood sugar twice a day. ICD-10 code E11.8. 1 each 0  . carvedilol (COREG) 25 MG tablet Take 1 tablet (25 mg total) by mouth 2 (two) times daily with a meal. 60 tablet 5  . cyclobenzaprine (FLEXERIL) 10 MG tablet TAKE ONE TABLET BY MOUTH EVERY 8 HOURS AS NEEDED FOR MUSCLE SPASM 30 tablet 0  . EPINEPHrine 0.3 mg/0.3 mL IJ SOAJ injection Inject 0.3 mLs (0.3 mg total) into the muscle once. (Patient not taking: Reported on 11/09/2015) 2 Device 2  . EXEL COMFORT POINT PEN NEEDLE 29G X 12MM MISC USE AS DIRECTED 100 each 0  . glucose blood (ACCU-CHEK AVIVA) test strip Use to test blood sugar twice a day.   ICD-10 code E11.8. 100 each 12  . LANTUS 100 UNIT/ML injection INJECT 20 UNITS INTO THE SKIN DAILY 100 vial 3  . losartan (COZAAR) 50 MG tablet Take 0.5 tablets (25 mg total) by mouth daily. 30 tablet 0  . metFORMIN (GLUCOPHAGE) 500 MG tablet Take 1 tablet (500 mg total) by mouth 2 (two) times daily with a meal. 60 tablet 5  . metoCLOPramide (REGLAN) 5 MG tablet Take 1 tablet (5 mg total) by mouth 3 (three) times daily before meals. 90 tablet 4  . ondansetron (ZOFRAN) 4 MG tablet Take 1 tablet (4 mg total) by mouth every 8 (eight) hours as needed for nausea or vomiting. 60 tablet 4  . pantoprazole (PROTONIX) 40 MG tablet Take 1 tablet (40 mg total) by mouth daily. 30 tablet 4  . potassium chloride (K-DUR) 10 MEQ tablet Take 1 tablet (10 mEq total) by mouth 2 (two) times daily. 8 tablet 0  . promethazine (PHENERGAN) 25 MG tablet TAKE ONE-HALF TO ONE TABLET BY MOUTH 4 TIMES DAILY 42 tablet 0  . traMADol (ULTRAM) 50 MG tablet Take 1 tablet (50 mg total) by mouth every 12 (twelve) hours as needed for severe pain. 30 tablet 0   No current facility-administered medications for this visit.    Chief complaint-noted Family history reviewed for today's visit. No changes. Social history- patient is a former smoker  Objective: BP (!) 164/96  Pulse (!) 108   Temp 98.6 F (37 C) (Oral)   Wt 266 lb (120.7 kg)   SpO2 100%   BMI 40.74 kg/m  Gen: NAD, alert, cooperative with exam HEENT: NCAT, EOMI, MMM Neck: FROM, supple CV: Tachycardic, regular rhythm, no murmur Resp: CTABL, no wheezes, normal work of breathing Msk: No edema, warm, normal tone, moves UE/LE spontaneously Neuro: Alert and oriented, no gross deficits Skin: No rashes, no lesions Psych: Appropriate behavior  Assessment/Plan: Hypokalemia: Has taken K-Dur 9mEq daily x 1 month. Was started on this for hypokalemia in the setting of vomiting 2/2 diabetic gastroparesis. Vomiting has since resolved. - Will check a potassium and magnesium  level today - If normalized, can likely stop K-Dur - Follow-up with PCP in 1-3 months   Health Care Maintenance: Pt overdue for HIV and Hep C testing - HIV and Hep C ordered, since we are already doing other lab work today.   Hyman Bible, MD PGY-2

## 2016-01-01 NOTE — Assessment & Plan Note (Signed)
Has taken K-Dur 70mEq daily x 1 month. Was started on this for hypokalemia in the setting of vomiting 2/2 diabetic gastroparesis. Vomiting has since resolved. - Will check a potassium and magnesium level today - If normalized, can likely stop K-Dur - Follow-up with PCP in 1-3 months

## 2016-01-01 NOTE — Patient Instructions (Signed)
It was so nice to meet you!  I have ordered some labs today- potassium, magnesium, HIV, and Hepatitis C. We have ordered Hepatitis C and HIV because it is recommended that every adult have these labs checked once in their life.  I will call you with the results of these labs.  -Dr. Brett Albino

## 2016-01-01 NOTE — Assessment & Plan Note (Signed)
Pt overdue for HIV and Hep C testing - HIV and Hep C ordered, since we are already doing other lab work today.

## 2016-01-02 ENCOUNTER — Telehealth: Payer: Self-pay | Admitting: *Deleted

## 2016-01-02 LAB — HEPATITIS C ANTIBODY: HCV AB: NEGATIVE

## 2016-01-02 NOTE — Telephone Encounter (Signed)
-----   Message from Sela Hua, MD sent at 01/02/2016 12:31 PM EDT ----- Please let [name] know that her potassium level was normal. She should stop taking her potassium supplement. Her Hepatitis C and HIV screening tests were also negative. Thank you!

## 2016-01-02 NOTE — Telephone Encounter (Signed)
Patient is aware of results. Torben Soloway,CMA  

## 2016-02-05 ENCOUNTER — Other Ambulatory Visit: Payer: Self-pay | Admitting: Family Medicine

## 2016-02-05 ENCOUNTER — Other Ambulatory Visit: Payer: Self-pay | Admitting: Internal Medicine

## 2016-02-05 DIAGNOSIS — I10 Essential (primary) hypertension: Secondary | ICD-10-CM

## 2016-02-05 NOTE — Telephone Encounter (Signed)
Patient would benefit from being seen in clinic. We need to discuss her diabetes and blood pressure to see how this is going. Please call patient and ask her to make a follow up appointment

## 2016-02-06 ENCOUNTER — Encounter: Payer: Self-pay | Admitting: *Deleted

## 2016-02-06 NOTE — Telephone Encounter (Signed)
lmovm for pt to call back.  Please have her schedule an appt. [place], Salome Spotted, CMA

## 2016-02-07 ENCOUNTER — Telehealth: Payer: Self-pay | Admitting: Internal Medicine

## 2016-02-07 NOTE — Telephone Encounter (Signed)
Pt received a vm from [place] the other day, in the notes it said to have pt make an appointment. I informed patient, but pt did not want to make an appointment. Pt only wants to speak with Janett Billow. Please advise. Thanks! ep

## 2016-02-07 NOTE — Telephone Encounter (Signed)
Will forward to [place]. Ladene Allocca,CMA

## 2016-02-08 NOTE — Telephone Encounter (Signed)
I attempted to call pt back, but had to leave a message again.  Pt needs appt to check on her BP as it was high at her last visit.  She also has not had her A1c checked since 08/07/15.  Dr. Dallas Schimke would like to see her to followup on these issues. Clinton Sawyer, Salome Spotted, [place]

## 2016-02-13 ENCOUNTER — Ambulatory Visit: Payer: Medicaid Other | Admitting: Internal Medicine

## 2016-02-22 ENCOUNTER — Other Ambulatory Visit: Payer: Self-pay | Admitting: Family Medicine

## 2016-03-01 NOTE — Telephone Encounter (Signed)
Rx called in. Please inform patient that she needs to be seen in clinic for further refills.

## 2016-03-01 NOTE — Telephone Encounter (Signed)
Detailed message left for pt

## 2016-04-17 ENCOUNTER — Other Ambulatory Visit: Payer: Self-pay | Admitting: Internal Medicine

## 2016-04-17 ENCOUNTER — Other Ambulatory Visit: Payer: Self-pay | Admitting: Family Medicine

## 2016-04-17 DIAGNOSIS — I10 Essential (primary) hypertension: Secondary | ICD-10-CM

## 2016-04-18 NOTE — Telephone Encounter (Signed)
LVM with pt informed stating her rx was refilled, however, she does need to call the office to schedule a FU apt with PCP for HTN and DM.

## 2016-04-18 NOTE — Telephone Encounter (Signed)
Prescription refilled. Please call patient to ask her to make a follow up appointment for DM and HTN.

## 2016-04-23 NOTE — Telephone Encounter (Signed)
Left voicemail for patient to call office back. If she does please schedule her an appointment. Nat Christen, CMA

## 2016-04-23 NOTE — Telephone Encounter (Signed)
2nd request.  Anabella Capshaw L, RN  

## 2016-04-23 NOTE — Telephone Encounter (Signed)
Patient needs to be seen in clinic prior to this refill. Please call and inform patient.

## 2016-08-21 ENCOUNTER — Other Ambulatory Visit: Payer: Self-pay | Admitting: Internal Medicine

## 2016-08-21 ENCOUNTER — Other Ambulatory Visit: Payer: Self-pay | Admitting: Family Medicine

## 2016-08-21 DIAGNOSIS — I10 Essential (primary) hypertension: Secondary | ICD-10-CM

## 2016-08-22 NOTE — Telephone Encounter (Signed)
Please call patient and tell her that she needs to be seen in clinic for further refills. She needs to make an appointment.

## 2016-08-26 NOTE — Telephone Encounter (Signed)
Spoke with patient and she states that she is waiting for her medicaid to be approved again before making an appointment.  Patient said that it was cancelled and she is working on getting it active again now.  She will call back once this has been taken care of. [facility]

## 2016-12-16 ENCOUNTER — Other Ambulatory Visit: Payer: Self-pay | Admitting: *Deleted

## 2016-12-16 DIAGNOSIS — E1165 Type 2 diabetes mellitus with hyperglycemia: Secondary | ICD-10-CM

## 2016-12-16 DIAGNOSIS — E118 Type 2 diabetes mellitus with unspecified complications: Principal | ICD-10-CM

## 2016-12-16 DIAGNOSIS — I10 Essential (primary) hypertension: Secondary | ICD-10-CM

## 2016-12-16 DIAGNOSIS — Z794 Long term (current) use of insulin: Principal | ICD-10-CM

## 2016-12-16 DIAGNOSIS — IMO0002 Reserved for concepts with insufficient information to code with codable children: Secondary | ICD-10-CM

## 2016-12-16 MED ORDER — AMLODIPINE BESYLATE 10 MG PO TABS: 10.0000 mg | ORAL_TABLET | Freq: Every day | ORAL | 0 refills | Status: DC

## 2016-12-16 MED ORDER — METFORMIN HCL 500 MG PO TABS: 500.0000 mg | ORAL_TABLET | Freq: Two times a day (BID) | ORAL | 0 refills | Status: DC

## 2016-12-16 MED ORDER — CARVEDILOL 25 MG PO TABS: 25.0000 mg | ORAL_TABLET | Freq: Two times a day (BID) | ORAL | 0 refills | Status: DC

## 2017-05-13 NOTE — Progress Notes (Signed)
[facility] Phone: [phone]   Date of Visit: 05/14/2017   HPI:  Type 2 diabetes: -Until the beginning of this year patient has not had insurance and therefore has not been able to afford her Lantus -She has been taking metformin 500 mg twice daily -She has not been able to check her sugars because she has lost her meter and requests a prescription for this -Denies any polyuria, polydipsia, abdominal pain, nausea, vomiting  Constipation: -Intermittent constipation for the past year.  She has used MiraLAX in the past with good results but since she had not had insurance last year it was difficult for her to obtain this medication. -Her last bowel movement was yesterday and she reports stool was hard.  No bleeding or blood noted on stool. -Reports she does stay hydrated with water  Hypertension: - Her medication list lists Norvasc 10 mg daily, carvedilol 25 mg twice daily losartan 50 mg daily - She has been out of her medications except for carvedilol 25 mg twice daily.  This is because his carvedilol is on $3 list at the pharmacy.  She could not afford other medications until recently because she was able to get her insurance - Denies any chest pain, palpitations, shortness of breath, headaches, blurred vision  Bilateral lower extremity edema: -This was noticed during physical exam today -Patient reports this has been going on for the past few weeks -Denies any worsening dyspnea on exertion.  Denies any orthopnea.  No history of heart failure  ROS: See HPI.  [place]:  PMH: HTN HLD Aneurysm of Renal Artery  OSA GERD Diabetic Gastroparesis DM2 OA bilateral knees Leiomyoma, Uterus Tension HA Former Smoker Morbid Obesity   PHYSICAL EXAM: BP (!) 138/96   Pulse (!) 102   Temp 98 F (36.7 C)   Ht 5' 7.75" (1.721 m)   Wt 281 lb (127.5 kg)   SpO2 98%   BMI 43.04 kg/m  GEN: NAD HEENT: Moist mucous membranes CV: RRR, no murmurs, rubs, or  gallops PULM: CTAB, normal effort SKIN: No rash or cyanosis; warm and well-perfused EXTR: Bilateral pitting edema up to mid shin.  No calf tenderness PSYCH: Mood and affect euthymic, normal rate and volume of speech NEURO: Awake, alert, no focal deficits grossly, normal speech   ASSESSMENT/PLAN:   Type 2 diabetes mellitus without complication, without long-term current use of insulin (HCC) A1c 11 today from 10.08 August 2015.  This is partly because she has not been able to afford her medications, mainly the Lantus that she was on.  But now she has insurance.  I would like to see if she will be able to be managed on oral therapies.  Plan is to increase metformin to 1000 mg twice daily.  Additionally will start Jardiance 10 mg daily.  Discussed side effects and symptoms terms to monitor for.  Foot exam completed today.  Patient restarted on ARB today.  BMP today.  Provided prescription for diabetic supplies.  Patient to check her sugars 3 times a day before meals.  Discussed the importance of oral hydration to avoid dehydration.  Follow-up in 1 week.  HYPERTENSION, BENIGN SYSTEMIC Patient's repeat blood pressure is mildly elevated at 138/96.  She has not been on her medications for months.  Therefore I will not restart all of them at once.  We will hold off starting Norvasc 10 mg daily.  Will start losartan at a lower dose of 25 mg daily.  Continue Coreg 25 mg twice  daily.  BMP today.  Follow-up in 1 week.  Constipation: -MiraLAX 17 g daily as needed  Bilateral lower extremity edema: We did not get to discuss in depth about this as this was incidentally found on my physical exam.  No symptoms of pulmonary edema.  Will obtain CBC to evaluate for anemia that may be causing the edema.  Unlikely this is due to DVT as this is bilateral and symmetric.  Will also obtain CMP to further evaluate.  Patient follow-up in 1 week.   [name], MD PGY [place]

## 2017-05-14 ENCOUNTER — Other Ambulatory Visit: Payer: Self-pay

## 2017-05-14 ENCOUNTER — Ambulatory Visit (INDEPENDENT_AMBULATORY_CARE_PROVIDER_SITE_OTHER): Payer: BC Managed Care – PPO | Admitting: Internal Medicine

## 2017-05-14 ENCOUNTER — Encounter: Payer: Self-pay | Admitting: Internal Medicine

## 2017-05-14 VITALS — BP 138/96 | HR 102 | Temp 98.0°F | Ht 67.75 in | Wt 281.0 lb

## 2017-05-14 DIAGNOSIS — E118 Type 2 diabetes mellitus with unspecified complications: Secondary | ICD-10-CM

## 2017-05-14 DIAGNOSIS — E785 Hyperlipidemia, unspecified: Secondary | ICD-10-CM | POA: Diagnosis not present

## 2017-05-14 DIAGNOSIS — R600 Localized edema: Secondary | ICD-10-CM

## 2017-05-14 DIAGNOSIS — Z794 Long term (current) use of insulin: Secondary | ICD-10-CM

## 2017-05-14 DIAGNOSIS — I10 Essential (primary) hypertension: Secondary | ICD-10-CM

## 2017-05-14 DIAGNOSIS — E119 Type 2 diabetes mellitus without complications: Secondary | ICD-10-CM

## 2017-05-14 LAB — POCT GLYCOSYLATED HEMOGLOBIN (HGB A1C): HEMOGLOBIN A1C: 11

## 2017-05-14 MED ORDER — GLUCOSE BLOOD VI STRP: ORAL_STRIP | 12 refills | Status: DC

## 2017-05-14 MED ORDER — EMPAGLIFLOZIN 10 MG PO TABS: 10.0000 mg | ORAL_TABLET | Freq: Every day | ORAL | 0 refills | Status: DC

## 2017-05-14 MED ORDER — LOSARTAN POTASSIUM 25 MG PO TABS: 25.0000 mg | ORAL_TABLET | Freq: Every day | ORAL | 0 refills | Status: DC

## 2017-05-14 MED ORDER — ACCU-CHEK SOFT TOUCH LANCETS MISC: 12 refills | Status: DC

## 2017-05-14 MED ORDER — ATORVASTATIN CALCIUM 20 MG PO TABS: ORAL_TABLET | ORAL | 0 refills | Status: DC

## 2017-05-14 MED ORDER — POLYETHYLENE GLYCOL 3350 17 G PO PACK: 17.0000 g | PACK | Freq: Every day | ORAL | 0 refills | Status: DC | PRN

## 2017-05-14 MED ORDER — ACCU-CHEK AVIVA PLUS W/DEVICE KIT: PACK | 0 refills | Status: DC

## 2017-05-14 NOTE — Assessment & Plan Note (Addendum)
A1c 11 today from 10.08 August 2015.  This is partly because she has not been able to afford her medications, mainly the Lantus that she was on.  But now she has insurance.  I would like to see if she will be able to be managed on oral therapies.  Plan is to increase metformin to 1000 mg twice daily.  Additionally will start Jardiance 10 mg daily.  Discussed side effects and symptoms terms to monitor for.  Foot exam completed today.  Patient restarted on ARB today.  BMP today.  Provided prescription for diabetic supplies.  Patient to check her sugars 3 times a day before meals.  Discussed the importance of oral hydration to avoid dehydration.  Follow-up in 1 week.

## 2017-05-14 NOTE — Assessment & Plan Note (Signed)
Patient's repeat blood pressure is mildly elevated at 138/96.  She has not been on her medications for months.  Therefore I will not restart all of them at once.  We will hold off starting Norvasc 10 mg daily.  Will start losartan at a lower dose of 25 mg daily.  Continue Coreg 25 mg twice daily.  BMP today.  Follow-up in 1 week.

## 2017-05-14 NOTE — Patient Instructions (Addendum)
Please increase metformin to 2 tablets twice a day  We started you on Jardiance. Take one tab daily.  Please check sugars three times a day  Follow up in 1 week  we will check labs  We restarted you on Losartan for blood pressure    Diet Recommendations for Diabetes   Starchy (carb) foods include: Bread, rice, pasta, potatoes, corn, crackers, bagels, muffins, all baked goods.  (Fruits, milk, and yogurt also have carbohydrate, but most of these foods will not spike your blood sugar as the starchy foods will.)  A few fruits do cause high blood sugars; use small portions of bananas (limit to 1/2 at a time), grapes, and most tropical fruits.    Protein foods include: Meat, fish, poultry, eggs, dairy foods, and beans such as pinto and kidney beans (beans also provide carbohydrate).   1. Eat at least 3 meals and 1-2 snacks per day. Never go more than 4-5 hours while awake without eating.  2. Limit starchy foods to TWO per meal and ONE per snack. ONE portion of a starchy  food is equal to the following:   - ONE slice of bread (or its equivalent, such as half of a hamburger bun).   - 1/2 cup of a "scoopable" starchy food such as potatoes or rice.   - 15 grams of carbohydrate as shown on food label.  3. Both lunch and dinner should include a protein food, a carb food, and vegetables.   - Obtain twice as many veg's as protein or carbohydrate foods for both lunch and dinner.   - Fresh or frozen veg's are best.   - Try to keep frozen veg's on hand for a quick vegetable serving.    4. Breakfast should always include protein.

## 2017-05-15 LAB — CBC
Hematocrit: 44.1 % (ref 34.0–46.6)
Hemoglobin: 14.5 g/dL (ref 11.1–15.9)
MCH: 26 pg — ABNORMAL LOW (ref 26.6–33.0)
MCHC: 32.9 g/dL (ref 31.5–35.7)
MCV: 79 fL (ref 79–97)
PLATELETS: 425 10*3/uL — AB (ref 150–379)
RBC: 5.58 x10E6/uL — AB (ref 3.77–5.28)
RDW: 15.9 % — AB (ref 12.3–15.4)
WBC: 9.4 10*3/uL (ref 3.4–10.8)

## 2017-05-15 LAB — CMP14+EGFR
A/G RATIO: 1 — AB (ref 1.2–2.2)
ALT: 10 IU/L (ref 0–32)
AST: 10 IU/L (ref 0–40)
Albumin: 3.6 g/dL (ref 3.5–5.5)
Alkaline Phosphatase: 155 IU/L — ABNORMAL HIGH (ref 39–117)
BUN/Creatinine Ratio: 12 (ref 9–23)
BUN: 11 mg/dL (ref 6–24)
Bilirubin Total: <0.2 mg/dL (ref 0.0–1.2)
CALCIUM: 9.8 mg/dL (ref 8.7–10.2)
CO2: 26 mmol/L (ref 20–29)
CREATININE: 0.89 mg/dL (ref 0.57–1.00)
Chloride: 102 mmol/L (ref 96–106)
GFR, EST AFRICAN AMERICAN: 83 mL/min/{1.73_m2} (ref >59)
GFR, EST NON AFRICAN AMERICAN: 72 mL/min/{1.73_m2} (ref >59)
Globulin, Total: 3.5 g/dL (ref 1.5–4.5)
Glucose: 219 mg/dL — ABNORMAL HIGH (ref 65–99)
POTASSIUM: 4.3 mmol/L (ref 3.5–5.2)
Sodium: 142 mmol/L (ref 134–144)
TOTAL PROTEIN: 7.1 g/dL (ref 6.0–8.5)

## 2017-05-16 ENCOUNTER — Telehealth: Payer: Self-pay | Admitting: Internal Medicine

## 2017-05-16 NOTE — Telephone Encounter (Signed)
Pt says she needs refill on tramadol. I don't see it on her current med list, please give pt a call

## 2017-05-16 NOTE — Telephone Encounter (Signed)
Will forward to MD to advise. [name],CMA  

## 2017-05-19 NOTE — Telephone Encounter (Signed)
Patient had tramadol last filled on Oct 2017. Reports that she takes it for back and knee pain. We discussed that we should discuss about this first before refilling. Patient was agreeable. She has a follow up appointment in clinic this week (unfortunately not with me as I am not in clinic the rest of the week) but she should be able to discuss this at that visit. If appropriate, Dr. Vanetta Shawl can send me a message and I will be happy to prescribe it. Will forward to Dr. Vanetta Shawl.

## 2017-05-21 ENCOUNTER — Ambulatory Visit (INDEPENDENT_AMBULATORY_CARE_PROVIDER_SITE_OTHER): Payer: BC Managed Care – PPO | Admitting: Family Medicine

## 2017-05-21 ENCOUNTER — Encounter: Payer: Self-pay | Admitting: Family Medicine

## 2017-05-21 ENCOUNTER — Other Ambulatory Visit: Payer: Self-pay

## 2017-05-21 VITALS — BP 140/68 | HR 84 | Temp 98.0°F | Ht 68.0 in | Wt 282.0 lb

## 2017-05-21 DIAGNOSIS — E1165 Type 2 diabetes mellitus with hyperglycemia: Secondary | ICD-10-CM

## 2017-05-21 DIAGNOSIS — I10 Essential (primary) hypertension: Secondary | ICD-10-CM | POA: Diagnosis not present

## 2017-05-21 DIAGNOSIS — M170 Bilateral primary osteoarthritis of knee: Secondary | ICD-10-CM

## 2017-05-21 DIAGNOSIS — E119 Type 2 diabetes mellitus without complications: Secondary | ICD-10-CM | POA: Diagnosis not present

## 2017-05-21 MED ORDER — TRAMADOL HCL 50 MG PO TABS: 50.0000 mg | ORAL_TABLET | Freq: Three times a day (TID) | ORAL | 0 refills | Status: DC | PRN

## 2017-05-21 NOTE — Assessment & Plan Note (Signed)
  Rx for tramadol given in office today. Could consider steroid injections in the future if patient desires.

## 2017-05-21 NOTE — Patient Instructions (Signed)
Diet Recommendations for Diabetes   CAREFUL WITH: Starchy(carb) foods: Bread, rice, pasta, potatoes, corn, cereal, grits, crackers, bagels, muffins, all baked goods.  (Fruits, milk, and yogurt also have carbohydrate, but most of these foods will not spike your blood sugar as the starchy foods will.)  A few fruits do cause high blood sugars; use small portions of bananas (limit to 1/2 at a time), grapes, watermelon, oranges, and most tropical fruits.    EAT PLENTY OF: Protein foods: Meat, fish, poultry, eggs, dairy foods, and beans such as pinto and kidney beans (beans also provide carbohydrate).   1. Eat at least 3 meals and 1-2 snacks per day. Never go more than 4-5 hours while awake without eating. Eat breakfast within the first hour of getting up.   2. Limit starchy foods to TWO per meal and ONE per snack. ONE portion of a starchy  food is equal to the following:   - ONE slice of bread (or its equivalent, such as half of a hamburger bun).   - 1/2 cup of a "scoopable" starchy food such as potatoes or rice.   - 15 grams of carbohydrate as shown on food label.  3. Include at every meal: a protein food, a carb food, and vegetables and/or fruit.   - Obtain twice the volume of veg's as protein or carbohydrate foods for both lunch and dinner.   - Fresh or frozen veg's are best.   - Keep frozen veg's on hand for a quick vegetable serving.         Diabetes Mellitus and Nutrition When you have diabetes (diabetes mellitus), it is very important to have healthy eating habits because your blood sugar (glucose) levels are greatly affected by what you eat and drink. Eating healthy foods in the appropriate amounts, at about the same times every day, can help you:  Control your blood glucose.  Lower your risk of heart disease.  Improve your blood pressure.  Reach or maintain a healthy weight.  Every person with diabetes is different, and each person has different needs for a meal plan. Your health  care provider may recommend that you work with a diet and nutrition specialist (dietitian) to make a meal plan that is best for you. Your meal plan may vary depending on factors such as:  The calories you need.  The medicines you take.  Your weight.  Your blood glucose, blood pressure, and cholesterol levels.  Your activity level.  Other health conditions you have, such as heart or kidney disease.  How do carbohydrates affect me? Carbohydrates affect your blood glucose level more than any other type of food. Eating carbohydrates naturally increases the amount of glucose in your blood. Carbohydrate counting is a method for keeping track of how many carbohydrates you eat. Counting carbohydrates is important to keep your blood glucose at a healthy level, especially if you use insulin or take certain oral diabetes medicines. It is important to know how many carbohydrates you can safely have in each meal. This is different for every person. Your dietitian can help you calculate how many carbohydrates you should have at each meal and for snack. Foods that contain carbohydrates include:  Bread, cereal, rice, pasta, and crackers.  Potatoes and corn.  Peas, beans, and lentils.  Milk and yogurt.  Fruit and juice.  Desserts, such as cakes, cookies, ice cream, and candy.  How does alcohol affect me? Alcohol can cause a sudden decrease in blood glucose (hypoglycemia), especially if you use  insulin or take certain oral diabetes medicines. Hypoglycemia can be a life-threatening condition. Symptoms of hypoglycemia (sleepiness, dizziness, and confusion) are similar to symptoms of having too much alcohol. If your health care provider says that alcohol is safe for you, follow these guidelines:  Limit alcohol intake to no more than 1 drink per day for nonpregnant women and 2 drinks per day for men. One drink equals 12 oz of beer, 5 oz of wine, or 1 oz of hard liquor.  Do not drink on an empty  stomach.  Keep yourself hydrated with water, diet soda, or unsweetened iced tea.  Keep in mind that regular soda, juice, and other mixers may contain a lot of sugar and must be counted as carbohydrates.  What are tips for following this plan? Reading food labels  Start by checking the serving size on the label. The amount of calories, carbohydrates, fats, and other nutrients listed on the label are based on one serving of the food. Many foods contain more than one serving per package.  Check the total grams (g) of carbohydrates in one serving. You can calculate the number of servings of carbohydrates in one serving by dividing the total carbohydrates by 15. For example, if a food has 30 g of total carbohydrates, it would be equal to 2 servings of carbohydrates.  Check the number of grams (g) of saturated and trans fats in one serving. Choose foods that have low or no amount of these fats.  Check the number of milligrams (mg) of sodium in one serving. Most people should limit total sodium intake to less than 2,300 mg per day.  Always check the nutrition information of foods labeled as "low-fat" or "nonfat". These foods may be higher in added sugar or refined carbohydrates and should be avoided.  Talk to your dietitian to identify your daily goals for nutrients listed on the label. Shopping  Avoid buying canned, premade, or processed foods. These foods tend to be high in fat, sodium, and added sugar.  Shop around the outside edge of the grocery store. This includes fresh fruits and vegetables, bulk grains, fresh meats, and fresh dairy. Cooking  Use low-heat cooking methods, such as baking, instead of high-heat cooking methods like deep frying.  Cook using healthy oils, such as olive, canola, or sunflower oil.  Avoid cooking with butter, cream, or high-fat meats. Meal planning  Eat meals and snacks regularly, preferably at the same times every day. Avoid going long periods of time  without eating.  Eat foods high in fiber, such as fresh fruits, vegetables, beans, and whole grains. Talk to your dietitian about how many servings of carbohydrates you can eat at each meal.  Eat 4-6 ounces of lean protein each day, such as lean meat, chicken, fish, eggs, or tofu. 1 ounce is equal to 1 ounce of meat, chicken, or fish, 1 egg, or 1/4 cup of tofu.  Eat some foods each day that contain healthy fats, such as avocado, nuts, seeds, and fish. Lifestyle   Check your blood glucose regularly.  Exercise at least 30 minutes 5 or more days each week, or as told by your health care provider.  Take medicines as told by your health care provider.  Do not use any products that contain nicotine or tobacco, such as cigarettes and e-cigarettes. If you need help quitting, ask your health care provider.  Work with a Social worker or diabetes educator to identify strategies to manage stress and any emotional and social challenges. What  are some questions to ask my health care provider?  Do I need to meet with a diabetes educator?  Do I need to meet with a dietitian?  What number can I call if I have questions?  When are the best times to check my blood glucose? Where to find more information:  American Diabetes Association: diabetes.org/food-and-fitness/food  Academy of Nutrition and Dietetics: PokerClues.dk  [employer] of Diabetes and Digestive and Kidney Diseases (NIH): ContactWire.be Summary  A healthy meal plan will help you control your blood glucose and maintain a healthy lifestyle.  Working with a diet and nutrition specialist (dietitian) can help you make a meal plan that is best for you.  Keep in mind that carbohydrates and alcohol have immediate effects on your blood glucose levels. It is important to count carbohydrates and to use alcohol  carefully.

## 2017-05-21 NOTE — Assessment & Plan Note (Signed)
  Tolerating medications well and making good strides with dietary changes. Discussed good foods to choose for DM and handout given with further information. Continue current regimen. Follow up 3 weeks with PCP for med refills/assess progress.

## 2017-05-21 NOTE — Progress Notes (Signed)
    Subjective:    Patient ID: AMORY ZBIKOWSKI, female    DOB: April 24, 1961, 57 y.o.   MRN: [id number]   CC: follow up BP, DM  DM Was seen last week, metformin increased to 1000 BID and added jardiance. She is tolerating these medication changes very well. Additionally she has cut out sodas entirely and now is drinking water and occasionally sugar free crystal light. She is also trying to eat better, incorporate more vegetables into her diet and cutting out pastries/desserts. She reports she feels better. CBG yesterday morning was 211. She reports this is a big improvement from what they used to be.  HTN Restarted on losartan 25 mg and continued coreg. She is tolerating losartan well. Denies CP, headaches, SOB, DOE.   Knee pain/low back pain- worse w/ activty walking up and down steps. Arthritis in these joints. Used to take tramadol 1-2 times a day, 1-2 times a week if she had a "flare up". Pain worse in cold weather as well. Requesting refill on tramadol. No numbness/tingling of LE. No leg weakness. No injury or trauma to area.   Smoking status reviewed- former smoker, quit in 2013  Review of Systems- see HPI   Objective:  BP 140/68   Pulse 84   Temp 98 F (36.7 C) (Oral)   Ht 5\' 8"  (1.727 m)   Wt 282 lb (127.9 kg)   SpO2 95%   BMI 42.88 kg/m  Vitals and nursing note reviewed  General: well nourished, in no acute distress Cardiac: RRR, clear S1 and S2, no murmurs, rubs, or gallops Respiratory: clear to auscultation bilaterally, no increased work of breathing Extremities: no edema or cyanosis. Skin: warm and dry, no rashes noted Neuro: alert and oriented, no focal deficits  Assessment & Plan:    Osteoarthritis of both knees  Rx for tramadol given in office today. Could consider steroid injections in the future if patient desires.   Type 2 diabetes mellitus without complication, without long-term current use of insulin (HCC)  Tolerating medications well and making good  strides with dietary changes. Discussed good foods to choose for DM and handout given with further information. Continue current regimen. Follow up 3 weeks with PCP for med refills/assess progress.   Essential hypertension  At goal today of <140/90 on losartan 25mg  and coreg 25 BID. She could likely be increased on ARB however will give her more time on new medication. Would repeat BMP in 3 weeks to assess kidney function/potassium and possibly increase dose at that time. Appointment made with PCP.     Return in about 4 weeks (around 06/18/2017).   Lucila Maine, DO Family Medicine Resident PGY-2

## 2017-05-21 NOTE — Assessment & Plan Note (Signed)
  At goal today of <140/90 on losartan 25mg  and coreg 25 BID. She could likely be increased on ARB however will give her more time on new medication. Would repeat BMP in 3 weeks to assess kidney function/potassium and possibly increase dose at that time. Appointment made with PCP.

## 2017-06-15 NOTE — Progress Notes (Deleted)
   [facility] Phone: [phone]   Date of Visit: 06/16/2017   HPI:  ***  ROS: See HPI.  [place]:  PMH: HTN HLD Renal Artery Aneurysm  GERD Diabetic Gastroparesis OA Knees Leiomyoma Of Uterus Former Smoker Tension HA    PHYSICAL EXAM: There were no vitals taken for this visit. Gen: *** HEENT: *** Heart: *** Lungs: *** Neuro: *** Ext: ***  ASSESSMENT/PLAN:  Health maintenance:  -***  No problem-specific Assessment & Plan notes found for this encounter.  FOLLOW UP: Follow up in *** for ***  [name], MD PGY [place]

## 2017-06-16 ENCOUNTER — Ambulatory Visit: Payer: BC Managed Care – PPO | Admitting: Internal Medicine

## 2017-06-20 ENCOUNTER — Other Ambulatory Visit: Payer: Self-pay

## 2017-06-20 DIAGNOSIS — I10 Essential (primary) hypertension: Secondary | ICD-10-CM

## 2017-06-22 NOTE — Progress Notes (Signed)
   [facility] Phone: [phone]   Date of Visit: 06/23/2017   HPI:  Type 2 diabetes: -Medications: Jardiance 10 mg daily, metformin 1000 mg twice daily -Patient reports she is doing well with the regimen and not had any side effects -CBGs are ranging from 130-139 in the morning and around 139 in the evening -Denies any polyuria or polydipsia   Hypertension: -Patient has been out of medications for about a week.  We discussed letting the pharmacy know to weeks before she runs out of medication. -Denies any headache, chest pain, blurred vision, shortness of breath -She reports that she does watch her salt intake but does eat a significant amount of canned foods.  We discussed the added salt in the canned foods for preservation.  Recommended switching to frozen vegetables but avoiding frozen meals.  ROS: See HPI.  [place]:  PMH: HTN DM2  PHYSICAL EXAM: BP (!) 182/90   Pulse 88   Temp 98.4 F (36.9 C) (Oral)   Wt 283 lb (128.4 kg)   SpO2 98%   BMI 43.03 kg/m  GEN: NAD HEENT: Atraumatic, normocephalic, neck supple, EOMI, sclera clear  CV: RRR, no murmurs, rubs, or gallops PULM: CTAB, normal effort SKIN: No rash or cyanosis; warm and well-perfused EXTR: no calf tenderness;  PSYCH: Mood and affect euthymic, normal rate and volume of speech NEURO: Awake, alert, no focal deficits grossly, normal speech   ASSESSMENT/PLAN:  HYPERTENSION, BENIGN SYSTEMIC Blood pressure elevated today due to patient running out of medicine for 1 week.  Patient is asymptomatic.  Restart carvedilol and hydrochlorothiazide.  Discussed measuring blood pressures intermittently at home or at the pharmacy.  Goal blood pressures less than 140/90.  Type 2 diabetes mellitus without complication, without long-term current use of insulin (HCC) CBGs seem to be improving with the addition of Jardiance.  Continue current regimen.  Discussed importance of proper diet and lifestyle  habits.  Bilateral lower extremity edema: CBC and CMP at the prior visit were normal.  Will obtain TSH to rule out thyroid causes for swelling.  Again no signs of heart failure.  Consider echocardiogram in the near future.  Likely venous insufficiency.  Discussed decreasing salt intake.  [name], MD PGY [place]

## 2017-06-23 ENCOUNTER — Ambulatory Visit (INDEPENDENT_AMBULATORY_CARE_PROVIDER_SITE_OTHER): Payer: BC Managed Care – PPO | Admitting: Internal Medicine

## 2017-06-23 ENCOUNTER — Encounter: Payer: Self-pay | Admitting: Internal Medicine

## 2017-06-23 ENCOUNTER — Other Ambulatory Visit: Payer: Self-pay

## 2017-06-23 VITALS — BP 182/90 | HR 88 | Temp 98.4°F | Wt 283.0 lb

## 2017-06-23 DIAGNOSIS — E119 Type 2 diabetes mellitus without complications: Secondary | ICD-10-CM

## 2017-06-23 DIAGNOSIS — R600 Localized edema: Secondary | ICD-10-CM | POA: Diagnosis not present

## 2017-06-23 DIAGNOSIS — I10 Essential (primary) hypertension: Secondary | ICD-10-CM

## 2017-06-23 MED ORDER — POLYETHYLENE GLYCOL 3350 17 G PO PACK: 17.0000 g | PACK | Freq: Every day | ORAL | 0 refills | Status: DC | PRN

## 2017-06-23 MED ORDER — CARVEDILOL 25 MG PO TABS: 25.0000 mg | ORAL_TABLET | Freq: Two times a day (BID) | ORAL | 1 refills | Status: DC

## 2017-06-23 MED ORDER — EMPAGLIFLOZIN 10 MG PO TABS: 10.0000 mg | ORAL_TABLET | Freq: Every day | ORAL | 2 refills | Status: DC

## 2017-06-23 MED ORDER — LOSARTAN POTASSIUM 25 MG PO TABS: 25.0000 mg | ORAL_TABLET | Freq: Every day | ORAL | 0 refills | Status: DC

## 2017-06-23 MED ORDER — HYDROCHLOROTHIAZIDE 12.5 MG PO TABS: 12.5000 mg | ORAL_TABLET | Freq: Every day | ORAL | 1 refills | Status: DC

## 2017-06-23 MED ORDER — METFORMIN HCL 1000 MG PO TABS: 1000.0000 mg | ORAL_TABLET | Freq: Two times a day (BID) | ORAL | 2 refills | Status: DC

## 2017-06-23 NOTE — Patient Instructions (Addendum)
Blood pressure goal is less than 140/90 Please try to switch from canned foods to frozen vegetables.  We will stop your Losartan and start you on Hydrochlorothiazide   Please make an appointment for your mammogram   We will check your thyroid test   Follow up in April    DASH Eating Plan DASH stands for "Dietary Approaches to Stop Hypertension." The DASH eating plan is a healthy eating plan that has been shown to reduce high blood pressure (hypertension). It may also reduce your risk for type 2 diabetes, heart disease, and stroke. The DASH eating plan may also help with weight loss. What are tips for following this plan? General guidelines  Avoid eating more than 2,300 mg (milligrams) of salt (sodium) a day. If you have hypertension, you may need to reduce your sodium intake to 1,500 mg a day.  Limit alcohol intake to no more than 1 drink a day for nonpregnant women and 2 drinks a day for men. One drink equals 12 oz of beer, 5 oz of wine, or 1 oz of hard liquor.  Work with your health care provider to maintain a healthy body weight or to lose weight. Ask what an ideal weight is for you.  Get at least 30 minutes of exercise that causes your heart to beat faster (aerobic exercise) most days of the week. Activities may include walking, swimming, or biking.  Work with your health care provider or diet and nutrition specialist (dietitian) to adjust your eating plan to your individual calorie needs. Reading food labels  Check food labels for the amount of sodium per serving. Choose foods with less than 5 percent of the Daily Value of sodium. Generally, foods with less than 300 mg of sodium per serving fit into this eating plan.  To find whole grains, look for the word "whole" as the first word in the ingredient list. Shopping  Buy products labeled as "low-sodium" or "no salt added."  Buy fresh foods. Avoid canned foods and premade or frozen meals. Cooking  Avoid adding salt when  cooking. Use salt-free seasonings or herbs instead of table salt or sea salt. Check with your health care provider or pharmacist before using salt substitutes.  Do not fry foods. Cook foods using healthy methods such as baking, boiling, grilling, and broiling instead.  Cook with heart-healthy oils, such as olive, canola, soybean, or sunflower oil. Meal planning   Eat a balanced diet that includes: ? 5 or more servings of fruits and vegetables each day. At each meal, try to fill half of your plate with fruits and vegetables. ? Up to 6-8 servings of whole grains each day. ? Less than 6 oz of lean meat, poultry, or fish each day. A 3-oz serving of meat is about the same size as a deck of cards. One egg equals 1 oz. ? 2 servings of low-fat dairy each day. ? A serving of nuts, seeds, or beans 5 times each week. ? Heart-healthy fats. Healthy fats called Omega-3 fatty acids are found in foods such as flaxseeds and coldwater fish, like sardines, salmon, and mackerel.  Limit how much you eat of the following: ? Canned or prepackaged foods. ? Food that is high in trans fat, such as fried foods. ? Food that is high in saturated fat, such as fatty meat. ? Sweets, desserts, sugary drinks, and other foods with added sugar. ? Full-fat dairy products.  Do not salt foods before eating.  Try to eat at least 2 vegetarian  meals each week.  Eat more home-cooked food and less restaurant, buffet, and fast food.  When eating at a restaurant, ask that your food be prepared with less salt or no salt, if possible. What foods are recommended? The items listed may not be a complete list. Talk with your dietitian about what dietary choices are best for you. Grains Whole-grain or whole-wheat bread. Whole-grain or whole-wheat pasta. Brown rice. [place]. Bulgur. Whole-grain and low-sodium cereals. Pita bread. Low-fat, low-sodium crackers. Whole-wheat flour tortillas. Vegetables Fresh or frozen vegetables  (raw, steamed, roasted, or grilled). Low-sodium or reduced-sodium tomato and vegetable juice. Low-sodium or reduced-sodium tomato sauce and tomato paste. Low-sodium or reduced-sodium canned vegetables. Fruits All fresh, dried, or frozen fruit. Canned fruit in natural juice (without added sugar). Meat and other protein foods Skinless chicken or [place]. Ground chicken or [place]. Pork with fat trimmed off. Fish and seafood. Egg whites. Dried beans, peas, or lentils. Unsalted nuts, nut butters, and seeds. Unsalted canned beans. Lean cuts of beef with fat trimmed off. Low-sodium, lean deli meat. Dairy Low-fat (1%) or fat-free (skim) milk. Fat-free, low-fat, or reduced-fat cheeses. Nonfat, low-sodium ricotta or cottage cheese. Low-fat or nonfat yogurt. Low-fat, low-sodium cheese. Fats and oils Soft margarine without trans fats. Vegetable oil. Low-fat, reduced-fat, or light mayonnaise and salad dressings (reduced-sodium). Canola, safflower, olive, soybean, and sunflower oils. Avocado. Seasoning and other foods Herbs. Spices. Seasoning mixes without salt. Unsalted popcorn and pretzels. Fat-free sweets. What foods are not recommended? The items listed may not be a complete list. Talk with your dietitian about what dietary choices are best for you. Grains Baked goods made with fat, such as croissants, muffins, or some breads. Dry pasta or rice meal packs. Vegetables Creamed or fried vegetables. Vegetables in a cheese sauce. Regular canned vegetables (not low-sodium or reduced-sodium). Regular canned tomato sauce and paste (not low-sodium or reduced-sodium). Regular tomato and vegetable juice (not low-sodium or reduced-sodium). Angie Fava. Olives. Fruits Canned fruit in a light or heavy syrup. Fried fruit. Fruit in cream or butter sauce. Meat and other protein foods Fatty cuts of meat. Ribs. Fried meat. Berniece Salines. Sausage. Bologna and other processed lunch meats. Salami. Fatback. Hotdogs. Bratwurst. Salted nuts  and seeds. Canned beans with added salt. Canned or smoked fish. Whole eggs or egg yolks. Chicken or [place] with skin. Dairy Whole or 2% milk, cream, and half-and-half. Whole or full-fat cream cheese. Whole-fat or sweetened yogurt. Full-fat cheese. Nondairy creamers. Whipped toppings. Processed cheese and cheese spreads. Fats and oils Butter. Stick margarine. Lard. Shortening. Ghee. Bacon fat. Tropical oils, such as coconut, palm kernel, or palm oil. Seasoning and other foods Salted popcorn and pretzels. Onion salt, garlic salt, seasoned salt, table salt, and sea salt. Worcestershire sauce. Tartar sauce. Barbecue sauce. Teriyaki sauce. Soy sauce, including reduced-sodium. Steak sauce. Canned and packaged gravies. Fish sauce. Oyster sauce. Cocktail sauce. Horseradish that you find on the shelf. Ketchup. Mustard. Meat flavorings and tenderizers. Bouillon cubes. Hot sauce and Tabasco sauce. Premade or packaged marinades. Premade or packaged taco seasonings. Relishes. Regular salad dressings. Where to find more information:  National Heart, Lung, and [place]: https://wilson-eaton.com/  American Heart Association: www.heart.org Summary  The DASH eating plan is a healthy eating plan that has been shown to reduce high blood pressure (hypertension). It may also reduce your risk for type 2 diabetes, heart disease, and stroke.  With the DASH eating plan, you should limit salt (sodium) intake to 2,300 mg a day. If you have hypertension, you may need to  reduce your sodium intake to 1,500 mg a day.  When on the DASH eating plan, aim to eat more fresh fruits and vegetables, whole grains, lean proteins, low-fat dairy, and heart-healthy fats.  Work with your health care provider or diet and nutrition specialist (dietitian) to adjust your eating plan to your individual calorie needs. This information is not intended to replace advice given to you by your health care provider. Make sure you discuss any questions  you have with your health care provider. Document Released: 04/11/2011 Document Revised: 04/15/2016 Document Reviewed: 04/15/2016 Elsevier Interactive Patient Education  [employer].

## 2017-06-24 ENCOUNTER — Encounter: Payer: Self-pay | Admitting: Internal Medicine

## 2017-06-24 LAB — TSH: TSH: 0.772 u[IU]/mL (ref 0.450–4.500)

## 2017-06-25 ENCOUNTER — Encounter: Payer: Self-pay | Admitting: Internal Medicine

## 2017-06-25 ENCOUNTER — Telehealth: Payer: Self-pay | Admitting: Internal Medicine

## 2017-06-25 DIAGNOSIS — N281 Cyst of kidney, acquired: Secondary | ICD-10-CM

## 2017-06-25 NOTE — Telephone Encounter (Signed)
Discussed the need for CTA of abdomen to monitor her small left renal artery aneurysm. Patient okay with imaging. Will order and forward to nursing to get this scheduled.

## 2017-06-25 NOTE — Telephone Encounter (Signed)
CT scheduled, 2/28 @2 :45 [facility]. Pt has been notified.

## 2017-06-25 NOTE — Assessment & Plan Note (Signed)
Blood pressure elevated today due to patient running out of medicine for 1 week.  Patient is asymptomatic.  Restart carvedilol and hydrochlorothiazide.  Discussed measuring blood pressures intermittently at home or at the pharmacy.  Goal blood pressures less than 140/90.

## 2017-06-25 NOTE — Assessment & Plan Note (Signed)
CBGs seem to be improving with the addition of Jardiance.  Continue current regimen.  Discussed importance of proper diet and lifestyle habits.

## 2017-07-01 ENCOUNTER — Telehealth: Payer: Self-pay

## 2017-07-01 NOTE — Telephone Encounter (Signed)
Pam in pre-services center at [facility] left a message on nurse line that patient is scheduled for a CT on 07/03/17. This CT requires a prior auth through patient's insurance and this needs to be obtained.   Pam's call back is [phone]. Danley Danker, RN [facility] RN)

## 2017-07-01 NOTE — Telephone Encounter (Signed)
Spoke with Jeannene Patella. Prior authorization is approved by Medicare, but still pending with [employer].

## 2017-07-03 ENCOUNTER — Ambulatory Visit (HOSPITAL_COMMUNITY): Admission: RE | Admit: 2017-07-03 | Payer: BC Managed Care – PPO | Source: Ambulatory Visit

## 2017-07-24 ENCOUNTER — Telehealth: Payer: Self-pay | Admitting: Internal Medicine

## 2017-07-24 NOTE — Telephone Encounter (Signed)
Pt has a cold and can't seem to get rid of it. Pt wants to know if Gunadasa could call her and suggest something she could take over the counter. Pt said she doesn't know what to buy with all the meds she's on. Please advise

## 2017-07-24 NOTE — Telephone Encounter (Signed)
Sneezing and coughing for the past three days or so. No fevers or increased work of breathing. She is wondering what medications she can take OTC since she does have HTN. I recommended DayQuil HBP. She can also try flonase for her nasal congestion if she likes. Discussed monitoring for symptoms that may need evaluation in the office.

## 2017-08-01 ENCOUNTER — Other Ambulatory Visit: Payer: Self-pay | Admitting: *Deleted

## 2017-08-01 MED ORDER — TRAMADOL HCL 50 MG PO TABS: 50.0000 mg | ORAL_TABLET | Freq: Three times a day (TID) | ORAL | 0 refills | Status: DC | PRN

## 2017-08-01 NOTE — Telephone Encounter (Signed)
Patient scheduled appt for 08-06-17.  Foye Haggart,CMA

## 2017-08-01 NOTE — Addendum Note (Signed)
Addended by: [name] on: 08/01/2017 10:08 AM   Modules accepted: Orders

## 2017-08-01 NOTE — Telephone Encounter (Signed)
Please ask patient to come in for a visit to discuss knee pain further.

## 2017-08-04 ENCOUNTER — Other Ambulatory Visit: Payer: Self-pay | Admitting: *Deleted

## 2017-08-04 MED ORDER — HYDROCHLOROTHIAZIDE 12.5 MG PO TABS: 12.5000 mg | ORAL_TABLET | Freq: Every day | ORAL | 1 refills | Status: DC

## 2017-08-05 NOTE — Progress Notes (Signed)
[facility] Phone: [phone]   Date of Visit: 08/06/2017   HPI:  DM2:  - Medications: Jardiance 10 mg daily, metformin 1000 mg twice daily - checks CBGs twice a day: 134, 129, no higher than 158. AM and before dinner. did not bring CBG log  -Denies polyuria or polydipsia - exercise: ride bicycles on the weekend, walk 2 hours a day just for exercise and she is also very active during work - She is watching her carbohydrate intake but reports she can improve on this  Left knee pain: -Chronic issue that is stable -Worse with walking up and down the steps -She has been doing her leg strengthening exercises that was given at a prior visit -She feels like her trying to lose weight will help with the knee pain as well - She takes tramadol-1-2 times a day about 2 times a week if she has severe pain -No weakness or falls, no numbness or tingling of the legs  Constipation: -Reports pharmacy has not given her MiraLAX yet..  This was  prescribed in February.  Pharmacy may be out of stock  Hypertension: Medications: Coreg 25 mg twice daily, hydrochlorothiazide 12.5 mg daily Denies any chest pain, shortness of breath, dyspnea on exertion, headaches, blurred vision.  Reports of mild bilateral lower extremity pitting edema which resolves with elevation of the lower extremities.  Denies any orthopnea  ROS: See HPI.  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  Minimal lateral joint line tenderness and no medial joint line tenderness of the left knee no effusion or  overlying erythema.  Normal range of motion and normal strength.  Ligaments are intact SKIN: No rash or cyanosis; warm and well-perfused EXTR: Trace pitting edema to midshin bilaterally.  No calf pain bilaterally PSYCH: Mood and affect euthymic, normal rate and volume of speech NEURO: Awake, alert, no focal deficits grossly, normal speech Diabetic Foot Exam - Simple   Simple Foot Form Visual Inspection No deformities, no ulcerations, no other skin breakdown bilaterally:  Yes Sensation Testing Intact to touch and monofilament testing bilaterally:  Yes Pulse Check Posterior Tibialis and Dorsalis pulse intact bilaterally:  Yes Comments      ASSESSMENT/PLAN:  Health maintenance: -Declines shingles vaccine - Reminded to-make an appointment for mammogram  Venous (peripheral) insufficiency Lower extremity pitting edema which resolves with elevation of legs most consistent with venous insufficiency.  No other symptoms to be concerned about heart failure.  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  We will reschedule this for her  Type 2 diabetes mellitus without complication, without long-term current use of insulin (HCC) A1c has improved to 8.9 from 11 in 3 months after starting Jardiance.  We will increase Jardiance to 25 mg as she is tolerating medication well.  Continue metformin 1000 mg twice daily.  She is motivated to improve her diet as well as increase her exercise.  Follow-up in  about 1-2 months.  Foot exam completed today.  Patient declines pneumococcal 23 valent vaccine.  Urine microalbumin creatinine ratio obtained today.  She should be on a ACE or ARB as she does have hypertension in the setting of diabetes.  Unfortunately she does have an allergy to ACE inhibitors.  Due to current recall of certain ARB inhibitors, will hold off starting this for now.  Hypercholesteremia Patient to make a lab visit  and come in fasting for lipid panel   [name], MD PGY [place]

## 2017-08-06 ENCOUNTER — Ambulatory Visit (INDEPENDENT_AMBULATORY_CARE_PROVIDER_SITE_OTHER): Payer: BC Managed Care – PPO | Admitting: Internal Medicine

## 2017-08-06 ENCOUNTER — Other Ambulatory Visit: Payer: Self-pay

## 2017-08-06 ENCOUNTER — Encounter: Payer: Self-pay | Admitting: Internal Medicine

## 2017-08-06 VITALS — BP 142/70 | HR 78 | Temp 98.0°F | Wt 279.0 lb

## 2017-08-06 DIAGNOSIS — Z1231 Encounter for screening mammogram for malignant neoplasm of breast: Secondary | ICD-10-CM

## 2017-08-06 DIAGNOSIS — E119 Type 2 diabetes mellitus without complications: Secondary | ICD-10-CM | POA: Diagnosis not present

## 2017-08-06 DIAGNOSIS — E7800 Pure hypercholesterolemia, unspecified: Secondary | ICD-10-CM | POA: Diagnosis not present

## 2017-08-06 DIAGNOSIS — Z1239 Encounter for other screening for malignant neoplasm of breast: Secondary | ICD-10-CM

## 2017-08-06 DIAGNOSIS — I722 Aneurysm of renal artery: Secondary | ICD-10-CM

## 2017-08-06 DIAGNOSIS — R748 Abnormal levels of other serum enzymes: Secondary | ICD-10-CM | POA: Diagnosis not present

## 2017-08-06 DIAGNOSIS — I872 Venous insufficiency (chronic) (peripheral): Secondary | ICD-10-CM

## 2017-08-06 LAB — POCT GLYCOSYLATED HEMOGLOBIN (HGB A1C): HEMOGLOBIN A1C: 8.9

## 2017-08-06 MED ORDER — DOCUSATE SODIUM 100 MG PO CAPS: 100.0000 mg | ORAL_CAPSULE | Freq: Two times a day (BID) | ORAL | 0 refills | Status: DC

## 2017-08-06 MED ORDER — POLYETHYLENE GLYCOL 3350 17 G PO PACK: 17.0000 g | PACK | Freq: Every day | ORAL | 0 refills | Status: DC | PRN

## 2017-08-06 MED ORDER — EMPAGLIFLOZIN 25 MG PO TABS: 25.0000 mg | ORAL_TABLET | Freq: Every day | ORAL | 2 refills | Status: DC

## 2017-08-06 NOTE — Assessment & Plan Note (Addendum)
A1c has improved to 8.9 from 11 in 3 months after starting Jardiance.  We will increase Jardiance to 25 mg as she is tolerating medication well.  Continue metformin 1000 mg twice daily.  She is motivated to improve her diet as well as increase her exercise.  Follow-up in about 1-2 months.  Foot exam completed today.  Patient declines pneumococcal 23 valent vaccine.  Urine microalbumin creatinine ratio obtained today.  She should be on a ACE or ARB as she does have hypertension in the setting of diabetes.  Unfortunately she does have an allergy to ACE inhibitors.  Due to current recall of certain ARB inhibitors, will hold off starting this for now.

## 2017-08-06 NOTE — Assessment & Plan Note (Signed)
Lower extremity pitting edema which resolves with elevation of legs most consistent with venous insufficiency.  No other symptoms to be concerned about heart failure.  Monitor for now

## 2017-08-06 NOTE — Patient Instructions (Addendum)
Thank you for coming in   [place] will increase Jardiance to 25mg  daily.  We will get blood work   Constipation:  1. Colace 100mg  twice a day  2. Miralax daily as needed   Follow up in 1-2 months    Diet Recommendations for Diabetes   Starchy (carb) foods include: Bread, rice, pasta, potatoes, corn, crackers, bagels, muffins, all baked goods.  (Fruits, milk, and yogurt also have carbohydrate, but most of these foods will not spike your blood sugar as the starchy foods will.)  A few fruits do cause high blood sugars; use small portions of bananas (limit to 1/2 at a time), grapes, and most tropical fruits.    Protein foods include: Meat, fish, poultry, eggs, dairy foods, and beans such as pinto and kidney beans (beans also provide carbohydrate).   1. Eat at least 3 meals and 1-2 snacks per day. Never go more than 4-5 hours while awake without eating.  2. Limit starchy foods to TWO per meal and ONE per snack. ONE portion of a starchy  food is equal to the following:   - ONE slice of bread (or its equivalent, such as half of a hamburger bun).   - 1/2 cup of a "scoopable" starchy food such as potatoes or rice.   - 15 grams of carbohydrate as shown on food label.  3. Both lunch and dinner should include a protein food, a carb food, and vegetables.   - Obtain twice as many veg's as protein or carbohydrate foods for both lunch and dinner.   - Fresh or frozen veg's are best.   - Try to keep frozen veg's on hand for a quick vegetable serving.    4. Breakfast should always include protein.

## 2017-08-06 NOTE — Assessment & Plan Note (Signed)
Patient to make a lab visit and come in fasting for lipid panel

## 2017-08-06 NOTE — Assessment & Plan Note (Signed)
Patient forgot about her appointment for imaging study.  We will reschedule this for her

## 2017-08-07 LAB — CMP14+EGFR
A/G RATIO: 1.1 — AB (ref 1.2–2.2)
ALK PHOS: 145 IU/L — AB (ref 39–117)
ALT: 14 IU/L (ref 0–32)
AST: 13 IU/L (ref 0–40)
Albumin: 3.7 g/dL (ref 3.5–5.5)
BILIRUBIN TOTAL: 0.2 mg/dL (ref 0.0–1.2)
BUN/Creatinine Ratio: 12 (ref 9–23)
BUN: 14 mg/dL (ref 6–24)
CALCIUM: 9.8 mg/dL (ref 8.7–10.2)
CHLORIDE: 100 mmol/L (ref 96–106)
CO2: 24 mmol/L (ref 20–29)
Creatinine, Ser: 1.15 mg/dL — ABNORMAL HIGH (ref 0.57–1.00)
GFR calc Af Amer: 61 mL/min/{1.73_m2} (ref >59)
GFR, EST NON AFRICAN AMERICAN: 53 mL/min/{1.73_m2} — AB (ref >59)
Globulin, Total: 3.4 g/dL (ref 1.5–4.5)
Glucose: 184 mg/dL — ABNORMAL HIGH (ref 65–99)
POTASSIUM: 3.9 mmol/L (ref 3.5–5.2)
SODIUM: 140 mmol/L (ref 134–144)
Total Protein: 7.1 g/dL (ref 6.0–8.5)

## 2017-08-07 LAB — MICROALBUMIN / CREATININE URINE RATIO
Creatinine, Urine: 48.5 mg/dL
MICROALB/CREAT RATIO: 2935.1 mg/g{creat} — AB (ref 0.0–30.0)
Microalbumin, Urine: 1423.5 ug/mL

## 2017-08-08 ENCOUNTER — Telehealth: Payer: Self-pay | Admitting: Internal Medicine

## 2017-08-08 DIAGNOSIS — R801 Persistent proteinuria, unspecified: Secondary | ICD-10-CM

## 2017-08-08 NOTE — Telephone Encounter (Signed)
Spoke with patient about lab work. I would like her to be seen by nephrology for her significantly elevated urine microalbumin to creatinine ratio.  Her Cr is slightly elevated from January. No NSAIDs or other medications that are nephrotoxic. Discussed making a lab visit next week to repeat BMP.

## 2017-08-15 ENCOUNTER — Ambulatory Visit (HOSPITAL_COMMUNITY)
Admission: RE | Admit: 2017-08-15 | Discharge: 2017-08-15 | Disposition: A | Discharge status: Home / Self Care | Payer: BC Managed Care – PPO | Source: Ambulatory Visit | Attending: Family Medicine | Admitting: Family Medicine

## 2017-08-15 DIAGNOSIS — I722 Aneurysm of renal artery: Secondary | ICD-10-CM | POA: Diagnosis not present

## 2017-08-15 DIAGNOSIS — I700 Atherosclerosis of aorta: Secondary | ICD-10-CM | POA: Insufficient documentation

## 2017-08-15 DIAGNOSIS — N281 Cyst of kidney, acquired: Secondary | ICD-10-CM | POA: Diagnosis not present

## 2017-08-15 DIAGNOSIS — Z9049 Acquired absence of other specified parts of digestive tract: Secondary | ICD-10-CM | POA: Insufficient documentation

## 2017-08-15 IMAGING — CT CT ANGIO ABDOMEN
3 of 9 series · 11 of 46 positions shown, 17 images · IV contrast (Omni 300)
Comparison: CT abdomen pelvis, [DATE]

CLINICAL DATA: Followup monitoring for a left renal artery
aneurysm.

EXAM:
CT ANGIOGRAPHY ABDOMEN
TECHNIQUE: Multidetector CT imaging of the abdomen was performed using the
standard protocol during bolus administration of intravenous
contrast. Multiplanar reconstructed images and MIPs were obtained
and reviewed to evaluate the vascular anatomy.
CONTRAST:  100mL [UY] IOPAMIDOL ([UY]) INJECTION 76%

[Series 5: renal cta · axial · 0.98mm/px · z∈[+991,+1138]mm · 5 of 99 slices shown]
[im 10/99  soft-tissue]
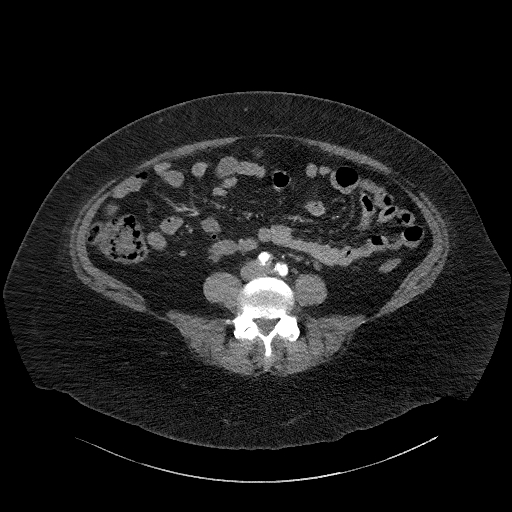
[im 20/99  soft-tissue]
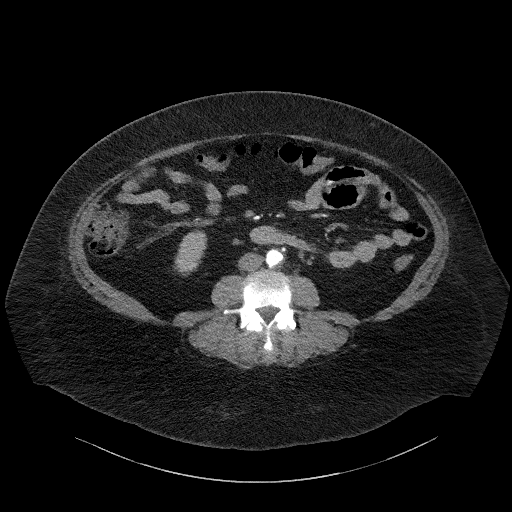
[im 30/99  soft-tissue]
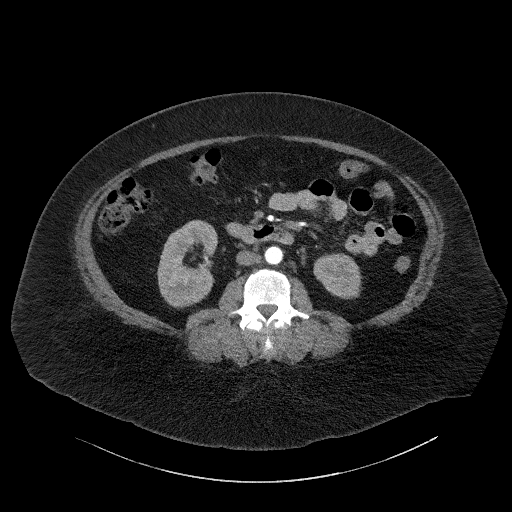
[im 40/99  soft-tissue]
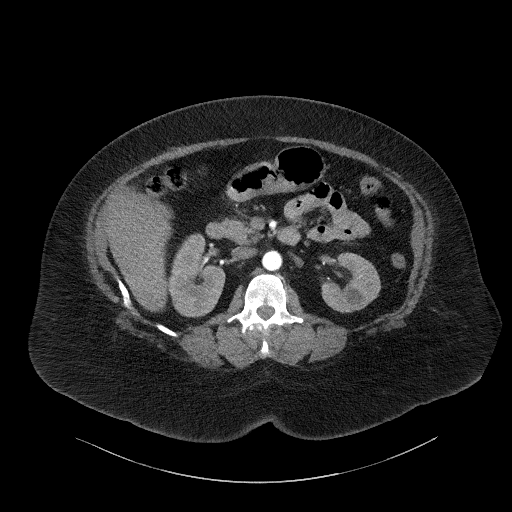
[im 59/99  soft-tissue]
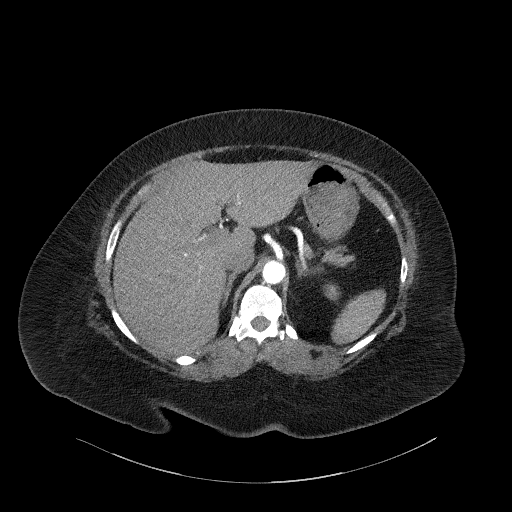

[Series 7: renal cta cor · coronal · 0.59mm/px · 2 of 177 slices shown, 3 images]
[im 59/177  soft-tissue]
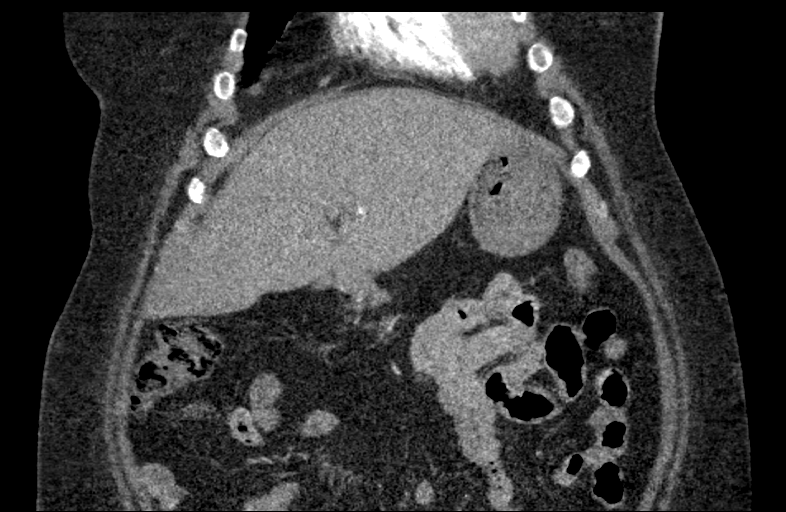
[im 59/177  bone]
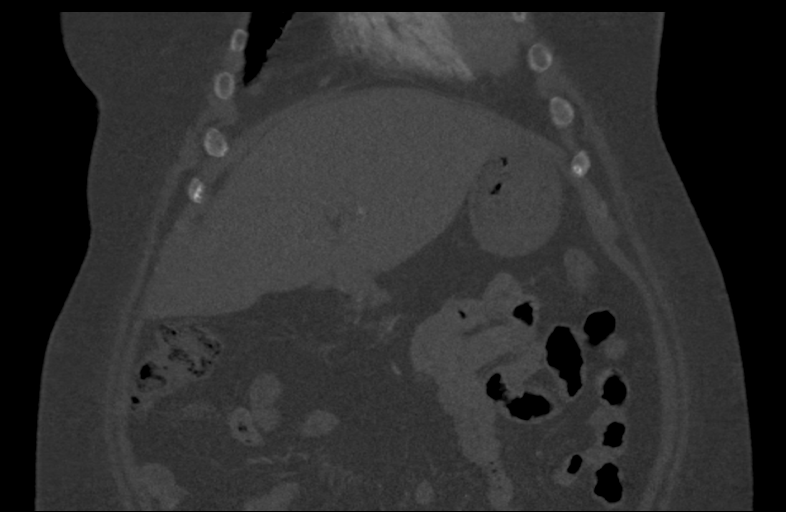
[im 118/177  soft-tissue]
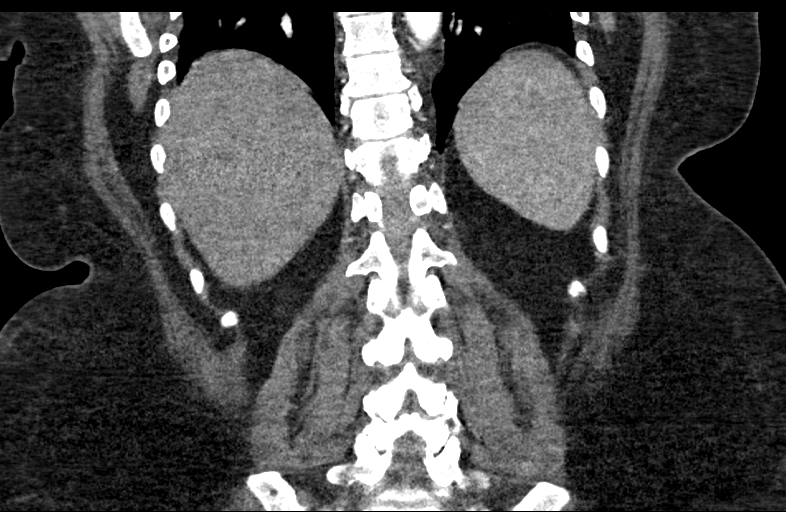

[Series 11: venous 5.0 · axial · portal-venous · 0.98mm/px · z∈[+1023,+1198]mm · 4 of 59 slices shown, 9 images]
[im 12/59  soft-tissue]
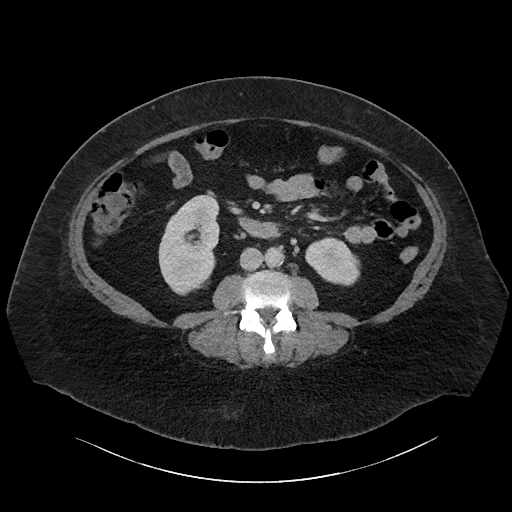
[im 12/59  lung]
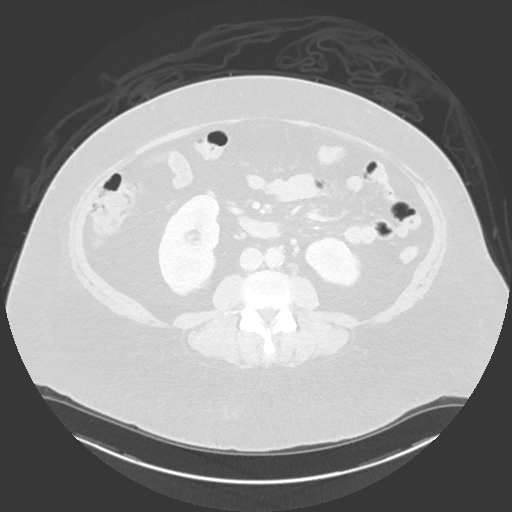
[im 12/59  bone]
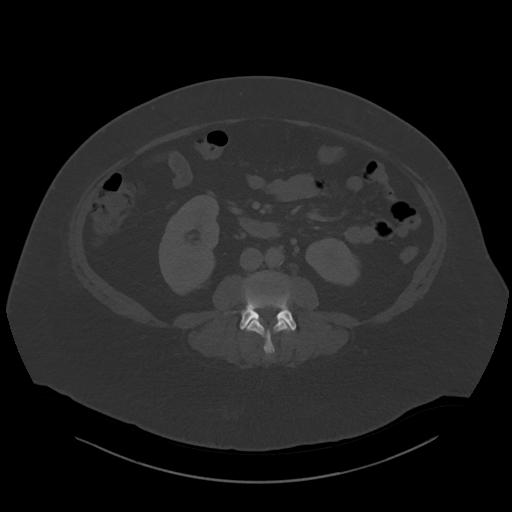
[im 24/59  soft-tissue]
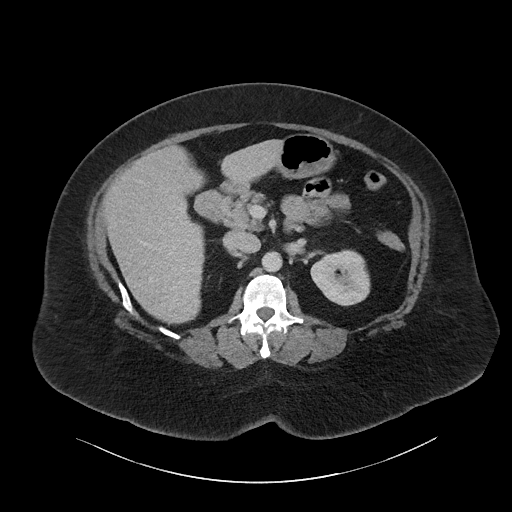
[im 24/59  lung]
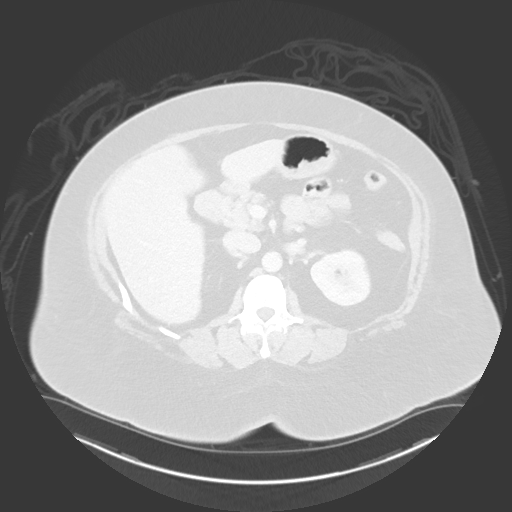
[im 35/59  soft-tissue]
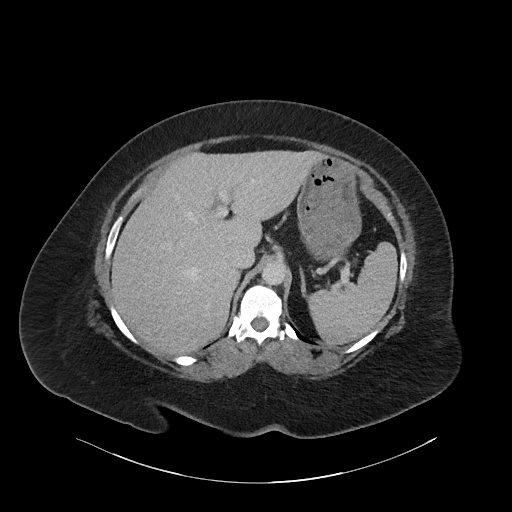
[im 35/59  lung]
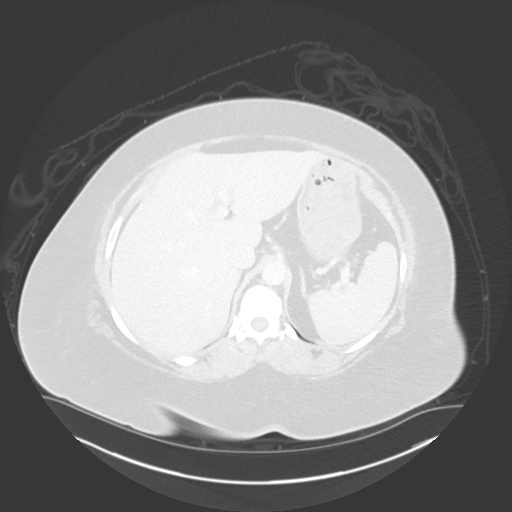
[im 47/59  soft-tissue]
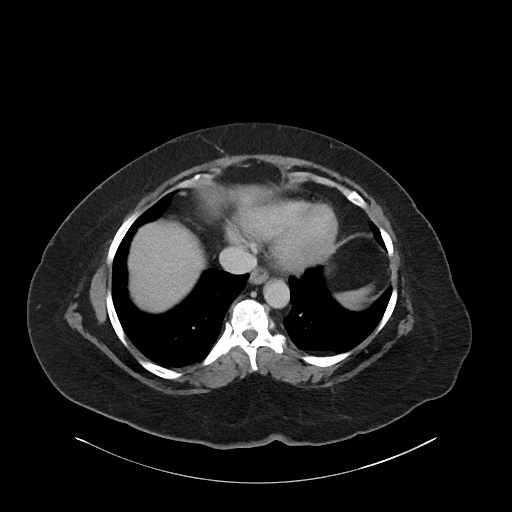
[im 47/59  lung]
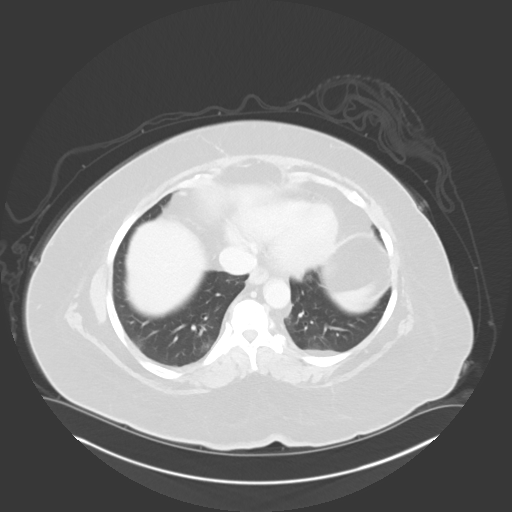

[11 of 46 positions shown; findings below may reference images not displayed]

FINDINGS: VASCULAR

Aorta: Normal in caliber. Mild atherosclerotic disease. No
significant stenosis.

Celiac: Patent without evidence of aneurysm, dissection, vasculitis
or significant stenosis.

SMA: Patent without evidence of aneurysm, dissection, vasculitis or
significant stenosis.

Renals: Minor plaque at the origin of the right renal artery. No
stenosis. No evidence of dissection or vasculitis or FMD.

1 cm aneurysm of the left renal artery, adjacent to the renal hilum,
with discontiguous peripheral wall calcification. This is unchanged
from the prior exam.

IMA: Patent without evidence of aneurysm, dissection, vasculitis or
significant stenosis.

Inflow: Mild atherosclerotic disease along the common femoral
arteries. No significant stenosis.

Veins: Unremarkable.  Venous phase imaging was performed.

Review of the MIP images confirms the above findings.

NON-VASCULAR

Lower chest: No acute abnormality.

Hepatobiliary: No focal liver abnormality is seen. Status post
cholecystectomy. No biliary dilatation.

Pancreas: Unremarkable. No pancreatic ductal dilatation or
surrounding inflammatory changes.

Spleen: Normal in size without focal abnormality.

Adrenals/Urinary Tract: No adrenal masses. 14 mm low-density mass
from the lower pole of the left kidney. 1 cm low-density mass from
the lower pole the right kidney, both consistent with cysts. Left
cysts has enlarged from 11 mm on the prior study. No other renal
masses, no stones and no hydronephrosis.

Stomach/Bowel: No bowel dilation. No wall thickening or
inflammation.

Lymphatic: No adenopathy.

Other: No hernia.  No ascites.

Musculoskeletal: No fracture or acute finding. Mild disc
degenerative changes at T11-T12. No osteoblastic or osteolytic
lesions.
IMPRESSION: VASCULAR

1. Stable 1 cm left renal artery aneurysm.
2. Mild aortic atherosclerosis. No significant stenosis the aorta or
of any branch vessel.

NON-VASCULAR

1. No acute findings.
2. Single renal cysts in each kidney. Status post cholecystectomy.

## 2017-08-15 MED ORDER — IOPAMIDOL (ISOVUE-370) INJECTION 76%: 100.0000 mL | Freq: Once | INTRAVENOUS | Status: DC | PRN

## 2017-08-15 MED ORDER — IOPAMIDOL (ISOVUE-370) INJECTION 76%
INTRAVENOUS | Status: AC
  Administered 2017-08-15: 100 mL
  Filled 2017-08-15: qty 100

## 2017-08-18 ENCOUNTER — Encounter: Payer: Self-pay | Admitting: Internal Medicine

## 2017-08-28 ENCOUNTER — Ambulatory Visit
Admission: RE | Admit: 2017-08-28 | Discharge: 2017-08-28 | Disposition: A | Discharge status: Home / Self Care | Payer: BC Managed Care – PPO | Source: Ambulatory Visit | Attending: Family Medicine | Admitting: Family Medicine

## 2017-08-28 DIAGNOSIS — Z1239 Encounter for other screening for malignant neoplasm of breast: Secondary | ICD-10-CM

## 2017-08-28 IMAGING — MG DIGITAL SCREENING BILATERAL MAMMOGRAM WITH TOMO AND CAD
6 of 10 series · 6 of 30 positions shown · non-contrast
Comparison: Previous exam(s).

ACR Breast Density Category a: The breast tissue is almost entirely
fatty.

CLINICAL DATA: Screening.

EXAM:
DIGITAL SCREENING BILATERAL MAMMOGRAM WITH TOMO AND CAD

[L MLO synth-2D (1 of 2)]
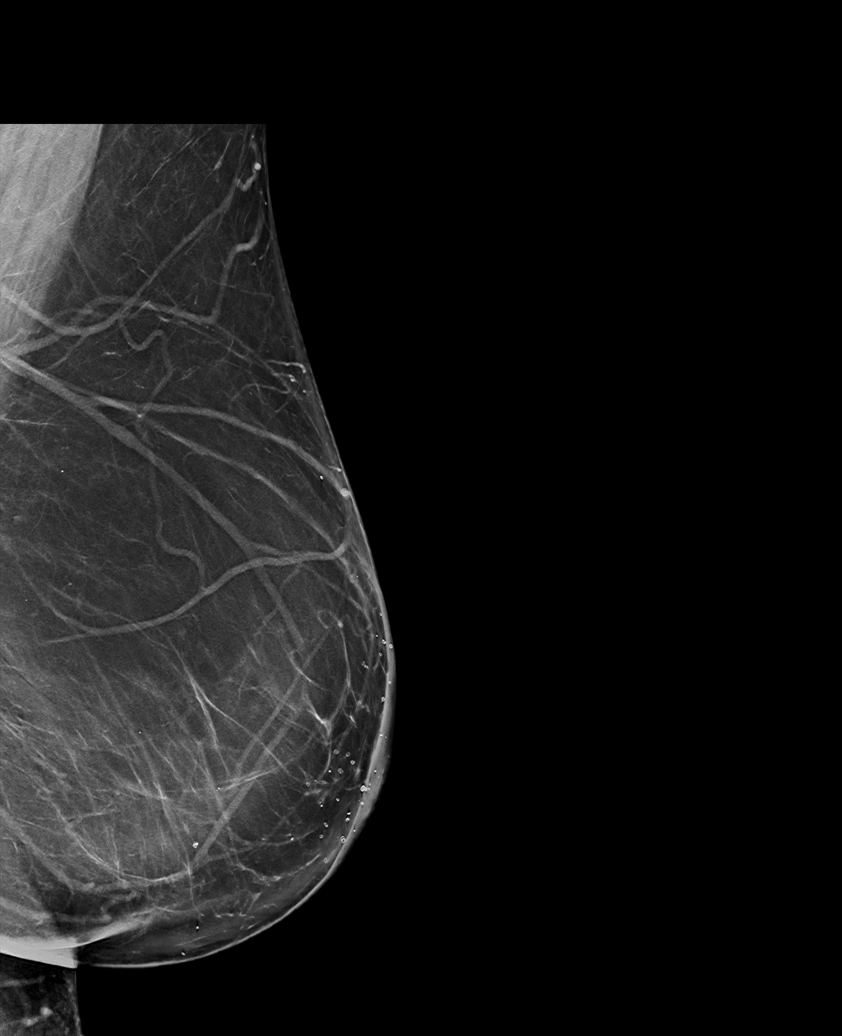

[L CC synth-2D]
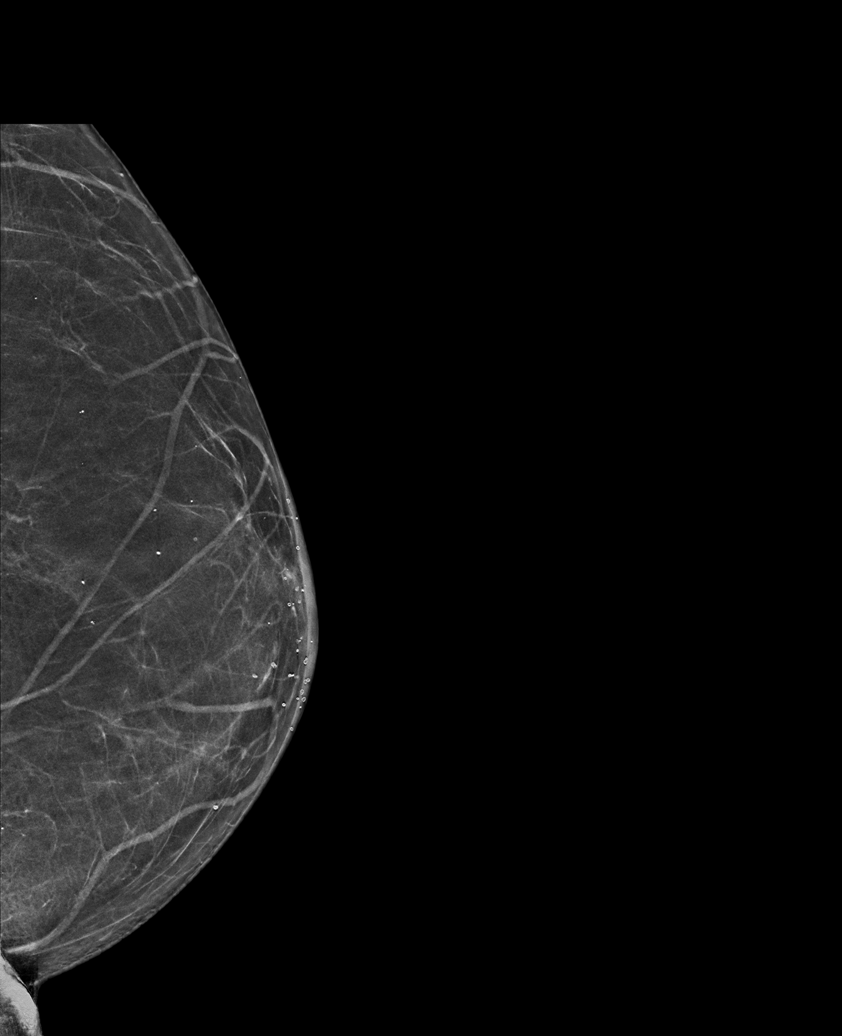

[R MLO synth-2D]
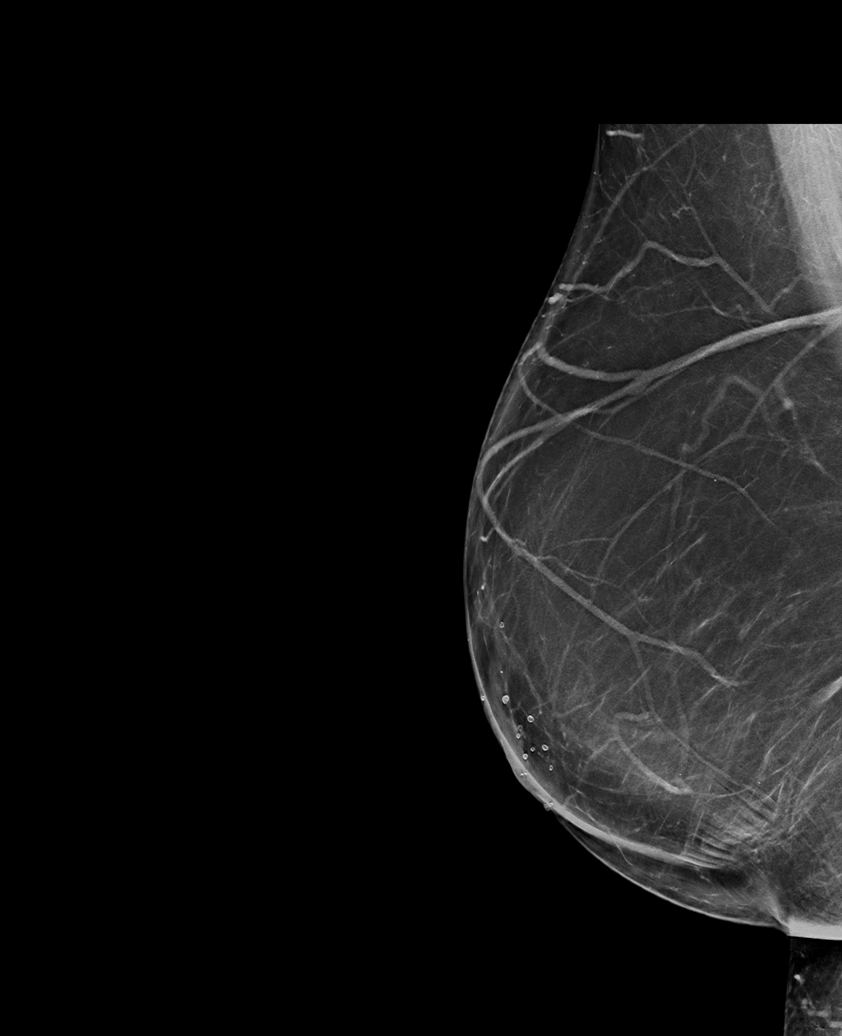

[L MLO synth-2D (2 of 2)]
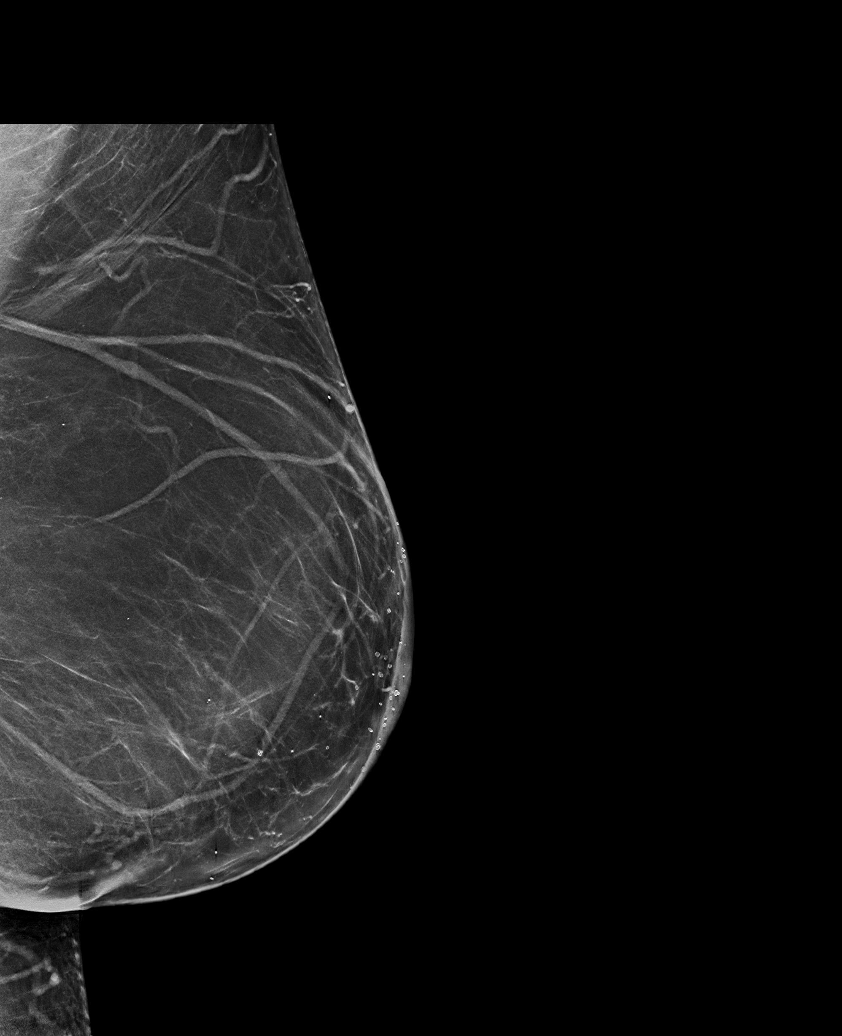

[R CC synth-2D]
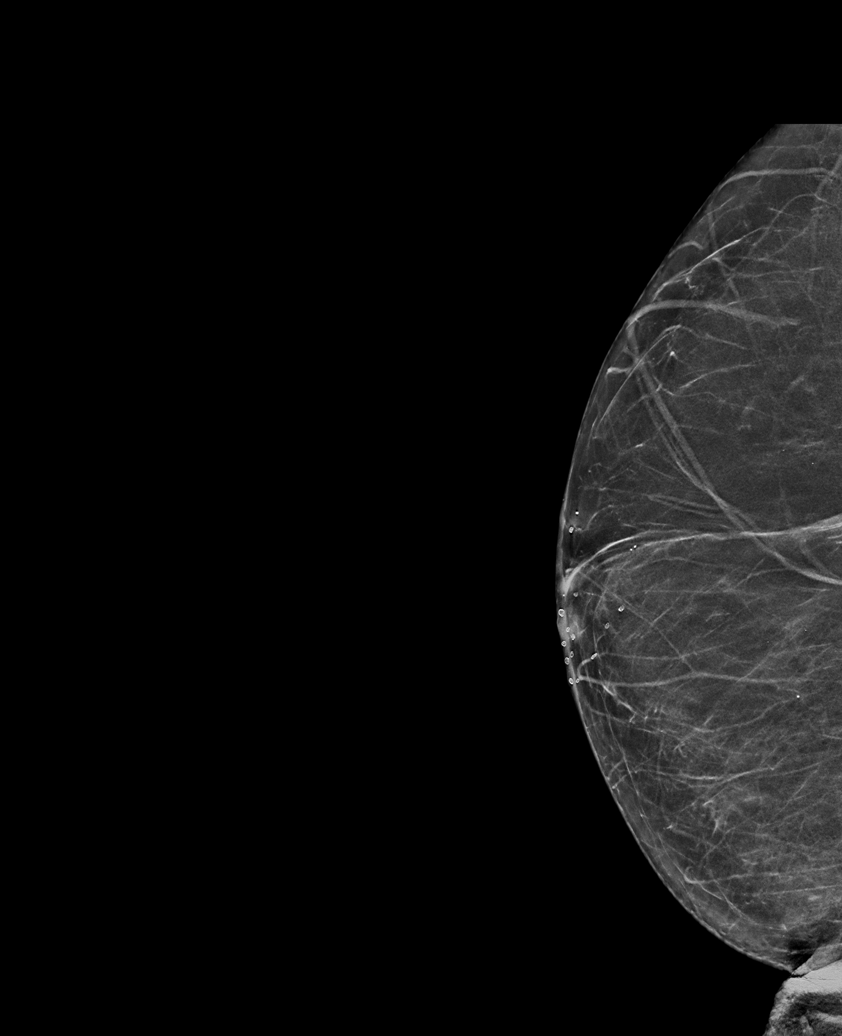

[R MLO tomo · tomo slice 38/75.0]
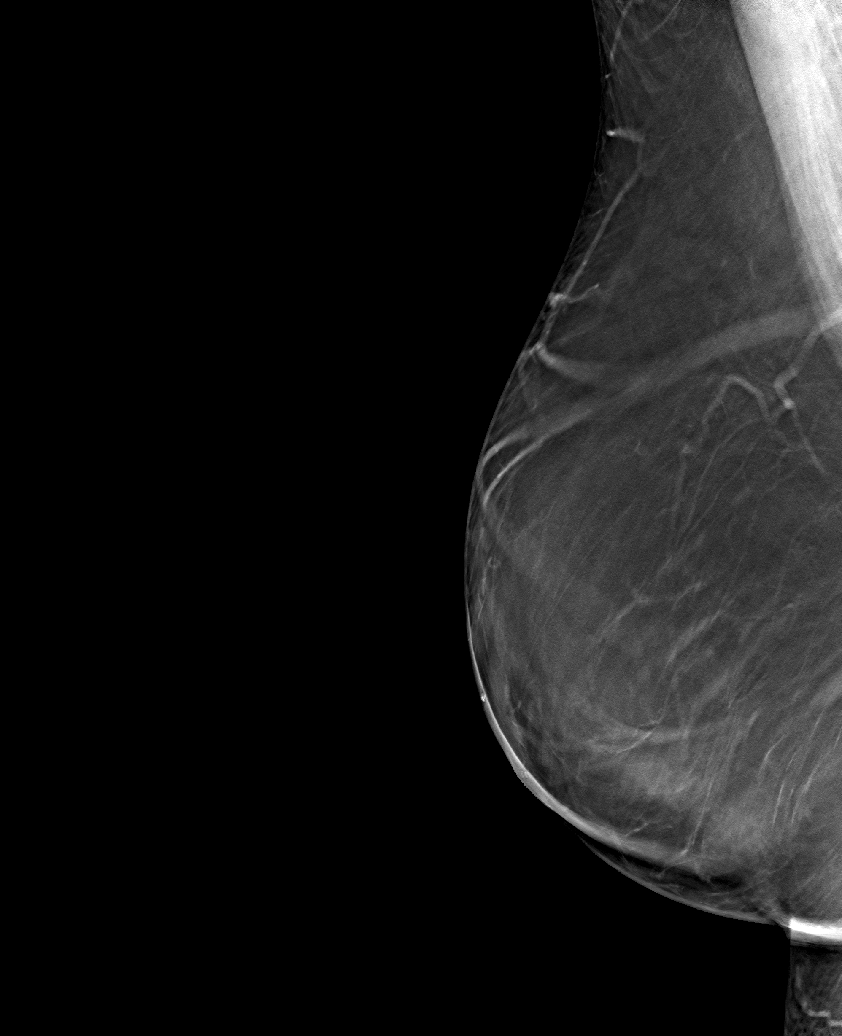

[6 of 30 positions shown; findings below may reference images not displayed]

FINDINGS: There are no findings suspicious for malignancy. Images were
processed with CAD.
IMPRESSION: No mammographic evidence of malignancy. A result letter of this
screening mammogram will be mailed directly to the patient.

RECOMMENDATION:
Screening mammogram in one year. (Code:[TA])

BI-RADS CATEGORY  1: Negative.

## 2017-09-16 ENCOUNTER — Other Ambulatory Visit: Payer: Self-pay | Admitting: Internal Medicine

## 2017-09-16 DIAGNOSIS — I10 Essential (primary) hypertension: Secondary | ICD-10-CM

## 2017-09-16 NOTE — Telephone Encounter (Signed)
Called patient to verify that she is not on norvasc. We did not restart this medication as her blood pressures were doing well off of it.

## 2017-09-29 ENCOUNTER — Other Ambulatory Visit: Payer: Self-pay | Admitting: Internal Medicine

## 2017-09-29 DIAGNOSIS — I10 Essential (primary) hypertension: Secondary | ICD-10-CM

## 2017-09-30 ENCOUNTER — Other Ambulatory Visit: Payer: Self-pay

## 2017-09-30 MED ORDER — TRAMADOL HCL 50 MG PO TABS: 50.0000 mg | ORAL_TABLET | Freq: Three times a day (TID) | ORAL | 0 refills | Status: DC | PRN

## 2017-10-15 ENCOUNTER — Ambulatory Visit (INDEPENDENT_AMBULATORY_CARE_PROVIDER_SITE_OTHER): Payer: BC Managed Care – PPO | Admitting: Student

## 2017-10-15 ENCOUNTER — Other Ambulatory Visit: Payer: Self-pay

## 2017-10-15 ENCOUNTER — Encounter: Payer: Self-pay | Admitting: Student

## 2017-10-15 VITALS — BP 185/100 | HR 87 | Temp 98.9°F | Ht 68.0 in | Wt 271.2 lb

## 2017-10-15 DIAGNOSIS — I160 Hypertensive urgency: Secondary | ICD-10-CM

## 2017-10-15 DIAGNOSIS — I10 Essential (primary) hypertension: Secondary | ICD-10-CM | POA: Diagnosis not present

## 2017-10-15 DIAGNOSIS — B372 Candidiasis of skin and nail: Secondary | ICD-10-CM | POA: Diagnosis not present

## 2017-10-15 MED ORDER — AMLODIPINE BESYLATE 5 MG PO TABS: 5.0000 mg | ORAL_TABLET | Freq: Every day | ORAL | 3 refills | Status: DC

## 2017-10-15 MED ORDER — NYSTATIN 100000 UNIT/GM EX CREA: 1.0000 "application " | TOPICAL_CREAM | Freq: Two times a day (BID) | CUTANEOUS | 1 refills | Status: DC

## 2017-10-15 MED ORDER — CLONIDINE HCL 0.1 MG PO TABS
0.1000 mg | ORAL_TABLET | Freq: Once | ORAL | Status: AC
  Administered 2017-10-15: 0.1 mg via ORAL

## 2017-10-15 MED ORDER — HYDROCHLOROTHIAZIDE 25 MG PO TABS: 25.0000 mg | ORAL_TABLET | Freq: Every day | ORAL | 0 refills | Status: DC

## 2017-10-15 NOTE — Patient Instructions (Addendum)
It was great seeing you today! We have addressed the following issues today  Hypertension/blood pressure: Your blood pressure is 185/100 mmHg.  This is very high.  Your goal blood pressure is less than 130/80 mmHg.  We increased your hydrochlorothiazide from 12.5 mg to 25 mg daily.  We also added a new medication called amlodipine at 5 mg daily.  Continue taking your carvedilol.  Check your blood pressure at home as we discussed in the office and keep the blood pressure log.  Follow-up in clinic in 1 week.  Please bring your blood pressure log to your next appointment.  Skin rash: this is likely due to yeast infection.  You are at risk of yeast infection due to your diabetes and Jardiance.  We sent a prescription for nystatin cream to the pharmacy.  You can use this until the rash resolves completely.  You may discuss about your medication with your primary care doctor.  Cough: this is likely residual from your common cold.  Cough may last about 4 to 5 weeks before it resolves completely.  I suggest good hydration.  You may try Mucinex over-the-counter to loosen up the phlegm.   If we did any lab work today, and the results require attention, either me or my nurse will get in touch with you. If everything is normal, you will get a letter in mail and a message via . If you don't hear from [place] in two weeks, please give [place] a call. Otherwise, we look forward to seeing you again at your next visit. If you have any questions or concerns before then, please call the clinic at [phone].  Please bring all your medications to every doctors visit  Sign up for My Chart to have easy access to your labs results, and communication with your Primary care physician.    Please check-out at the front desk before leaving the clinic.    Take Care,   Dr. Cyndia Skeeters

## 2017-10-15 NOTE — Progress Notes (Signed)
Subjective:    [name] is a 57 y.o. old female here for groin and perineal rash  HPI Rash: in both groins and perineal areas. For two weeks. It looks like it is clearing up but not completely. Has been using Vaseline. Itchy. Denies vaginal discharge or urinary symptoms. Denies skin break or lumps or bumps. 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 Started Jardiance two months ago.  She had similar rash in the past before she started [place].   Coughing: this has been going for three weeks. Started with runny nose and congestion. Cough is whitish to yellowish phlegm. ROS negative.   Blood pressure: she is on Coreg 25 twice a day and hydrochlorothiazide 12.5 mg once daily. Watch her salt intake. Doesn't take OTC pain medicine. Walk about 30 minutes a day. Doesn't check her blood pressure at home.  Thinks she snores at night.  However, she denies fatigue or daytime sleepiness.  Denies smoking cigarettes or drinking alcohol.  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   has a past medical history of Diabetes mellitus, GERD (gastroesophageal reflux disease), Hyperlipidemia, Hypertension, and Nausea.  FH:  Family History  Problem Relation Age of Onset  . Hypertension Mother   . Diabetes Mother   . Cancer Father        lung  . Colon cancer Neg Hx   . Stomach cancer Neg Hx   .  Esophageal cancer Neg Hx     SH Social History   Tobacco Use  . Smoking status: Former Smoker    Years: 10.00    Types: Cigarettes    Last attempt to quit: 10/16/2011    Years since quitting: 6.0  . Smokeless tobacco: Never Used  . Tobacco comment: 1 pack will last a week  Substance Use Topics  . Alcohol use: No    Alcohol/week: 0.0 oz  . Drug use: No    Review of Systems Review of systems negative except for pertinent positives and negatives in history of present illness above.     Objective:     Vitals:   10/15/17 1550 10/15/17 1558 10/15/17 1618 10/15/17 1702  BP: (!) 212/112 (!) 212/108 (!) 200/110 (!) 185/100  Pulse: 87     Temp: 98.9 F (37.2 C)     TempSrc: Oral     SpO2: 99%     Weight: 271 lb 3.2 oz (123 kg)     Height: 5\' 8"  (1.727 m)      Body mass index is 41.24 kg/m.  Physical Exam  GEN: appears well & comfortable. No apparent distress. Head: normocephalic and atraumatic  Eyes: conjunctiva without injection. Sclera anicteric. CVS: RRR, nl s1 & s2, no murmurs, no edema RESP: no IWOB, good air movement bilaterally, CTAB GI: BS present & normal, soft, NTND MSK: no focal tenderness or notable swelling SKIN: Indurated skin in the groin and perineal area.  No inguinal lymphadenopathy or obvious skin ulcer.  No signs of cellulitis.  No tenderness to palpation in groin area.  No notable vaginal discharge.  Chaperone (Ms.  April) present for this exam.  ENDO: negative thyromegally NEURO: awake, alert and oriented appropriately. Cranial nerves II-XII intact, motor 5/5 in all muscle groups of UE and LE bilaterally, normal tone, light sensation intact in all dermatomes of upper and lower ext bilaterally, no pronator drift, biceps, patellar and achille reflexes 2+ bilaterally, finger to nose intact PSYCH: euthymic mood with congruent affect    Assessment and Plan:  1. Hypertensive urgency: unclear etiology.  She reports good compliance with her medications.  She  is on carvedilol 25 mg twice daily and hydrochlorothiazide 12.5 mg daily.  She appears to be doing what she is supposed to do.  She has no signs of endorgan damage.  Cardiopulmonary and neuro exam within normal limits.  BP improved after oral clonidine 0.1 mg in clinic. (sublingual out of stock).  She has history of angioedema with lisinopril.  Will increase hydrochlorothiazide from 12.5 mg to 25 mg daily.  Will add amlodipine 5 mg daily.  Will check BMP today.  Gave her prescription for blood pressure cuff.  Recommended checking her blood pressure at least daily and keeping her blood pressure log.  Follow-up in 5 days or sooner if needed.  Recommended bringing her blood pressure log to that visit.  Discussed return precautions in detail.  We will recheck a BMP when she returns. Her STOP BANG score is 4. We may consider referral to sleep clinic if no improvement in BP. - Basic metabolic panel - DME Other see comment - cloNIDine (CATAPRES) tablet 0.1 mg - amLODipine (NORVASC) 5 MG tablet; Take 1 tablet (5 mg total) by mouth daily.  Dispense: 90 tablet; Refill: 3 - hydrochlorothiazide (HYDRODIURIL) 25 MG tablet; Take 1 tablet (25 mg total) by mouth daily.  Dispense: 30 tablet; Refill: 0  2. Skin candidiasis: history and exam concerning for candidiasis.  She is at high risk for this due to poorly controlled diabetes.  She is also on Jardiance which increases her risk.  Will treat with nystatin cream.  Recommended discussing about her diabetic medication with her PCP. - nystatin cream (MYCOSTATIN); Apply 1 application topically 2 (two) times daily.  Dispense: 30 g; Refill: 1  Return in about 5 days (around 10/20/2017) for Hypertension.  Mercy Riding, MD 10/15/17 Pager: [phone]

## 2017-10-16 LAB — BASIC METABOLIC PANEL
BUN / CREAT RATIO: 11 (ref 9–23)
BUN: 14 mg/dL (ref 6–24)
CO2: 23 mmol/L (ref 20–29)
CREATININE: 1.32 mg/dL — AB (ref 0.57–1.00)
Calcium: 9.5 mg/dL (ref 8.7–10.2)
Chloride: 101 mmol/L (ref 96–106)
GFR calc Af Amer: 51 mL/min/{1.73_m2} — ABNORMAL LOW (ref >59)
GFR, EST NON AFRICAN AMERICAN: 45 mL/min/{1.73_m2} — AB (ref >59)
Glucose: 158 mg/dL — ABNORMAL HIGH (ref 65–99)
POTASSIUM: 3.9 mmol/L (ref 3.5–5.2)
SODIUM: 141 mmol/L (ref 134–144)

## 2017-10-20 NOTE — Progress Notes (Deleted)
   [facility] Phone: [phone]   Date of Visit: 10/21/2017   HPI:  ***  ROS: See HPI.  [place]: ***  PHYSICAL EXAM: There were no vitals taken for this visit. Gen: *** HEENT: *** Heart: *** Lungs: *** Neuro: *** Ext: ***  ASSESSMENT/PLAN:  Health maintenance:  -***  No problem-specific Assessment & Plan notes found for this encounter.  FOLLOW UP: Follow up in *** for ***  [name], MD PGY [place]

## 2017-10-21 ENCOUNTER — Ambulatory Visit: Payer: BC Managed Care – PPO | Admitting: Internal Medicine

## 2017-11-15 ENCOUNTER — Other Ambulatory Visit: Payer: Self-pay | Admitting: Internal Medicine

## 2017-11-15 DIAGNOSIS — I10 Essential (primary) hypertension: Secondary | ICD-10-CM

## 2018-01-03 ENCOUNTER — Other Ambulatory Visit: Payer: Self-pay | Admitting: Internal Medicine

## 2018-01-03 ENCOUNTER — Other Ambulatory Visit: Payer: Self-pay | Admitting: Family Medicine

## 2018-01-03 DIAGNOSIS — I10 Essential (primary) hypertension: Secondary | ICD-10-CM

## 2018-02-02 ENCOUNTER — Emergency Department (HOSPITAL_COMMUNITY): Payer: No Typology Code available for payment source

## 2018-02-02 ENCOUNTER — Encounter (HOSPITAL_COMMUNITY): Payer: Self-pay

## 2018-02-02 ENCOUNTER — Emergency Department (HOSPITAL_COMMUNITY)
Admission: EM | Admit: 2018-02-02 | Discharge: 2018-02-02 | Disposition: A | Discharge status: Home / Self Care | Payer: No Typology Code available for payment source | Attending: Emergency Medicine | Admitting: Emergency Medicine

## 2018-02-02 DIAGNOSIS — Z87891 Personal history of nicotine dependence: Secondary | ICD-10-CM | POA: Diagnosis not present

## 2018-02-02 DIAGNOSIS — M25561 Pain in right knee: Secondary | ICD-10-CM | POA: Diagnosis not present

## 2018-02-02 DIAGNOSIS — I10 Essential (primary) hypertension: Secondary | ICD-10-CM | POA: Diagnosis not present

## 2018-02-02 DIAGNOSIS — Z79899 Other long term (current) drug therapy: Secondary | ICD-10-CM | POA: Diagnosis not present

## 2018-02-02 DIAGNOSIS — Y929 Unspecified place or not applicable: Secondary | ICD-10-CM | POA: Insufficient documentation

## 2018-02-02 DIAGNOSIS — M25562 Pain in left knee: Secondary | ICD-10-CM | POA: Insufficient documentation

## 2018-02-02 DIAGNOSIS — Z7984 Long term (current) use of oral hypoglycemic drugs: Secondary | ICD-10-CM | POA: Insufficient documentation

## 2018-02-02 DIAGNOSIS — Y999 Unspecified external cause status: Secondary | ICD-10-CM | POA: Diagnosis not present

## 2018-02-02 DIAGNOSIS — Y9301 Activity, walking, marching and hiking: Secondary | ICD-10-CM | POA: Diagnosis not present

## 2018-02-02 DIAGNOSIS — E119 Type 2 diabetes mellitus without complications: Secondary | ICD-10-CM | POA: Diagnosis not present

## 2018-02-02 DIAGNOSIS — W1842XA Slipping, tripping and stumbling without falling due to stepping into hole or opening, initial encounter: Secondary | ICD-10-CM | POA: Diagnosis not present

## 2018-02-02 IMAGING — CR DG KNEE COMPLETE 4+V*R*
4 series · 4 of 4 positions shown · non-contrast
Comparison: None.

CLINICAL DATA: Fell this morning. RIGHT greater than LEFT
generalized knee pain.

EXAM:
RIGHT KNEE - COMPLETE 4+ VIEW; LEFT KNEE - COMPLETE 4+ VIEW

[x knee ap right (1 of 3)]
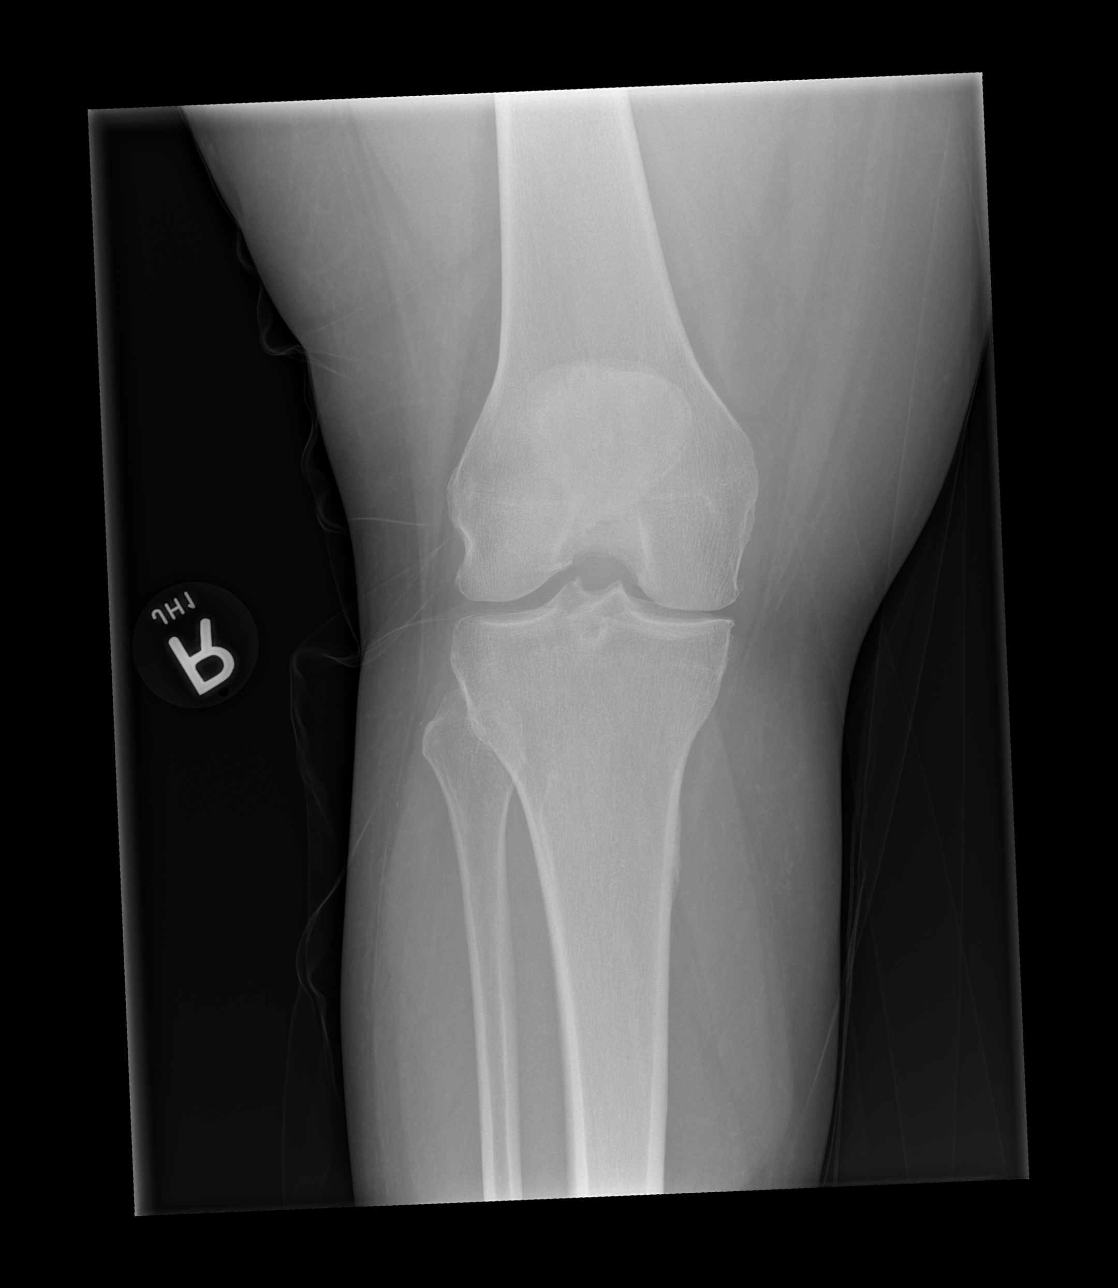

[x knee ap right (2 of 3)]
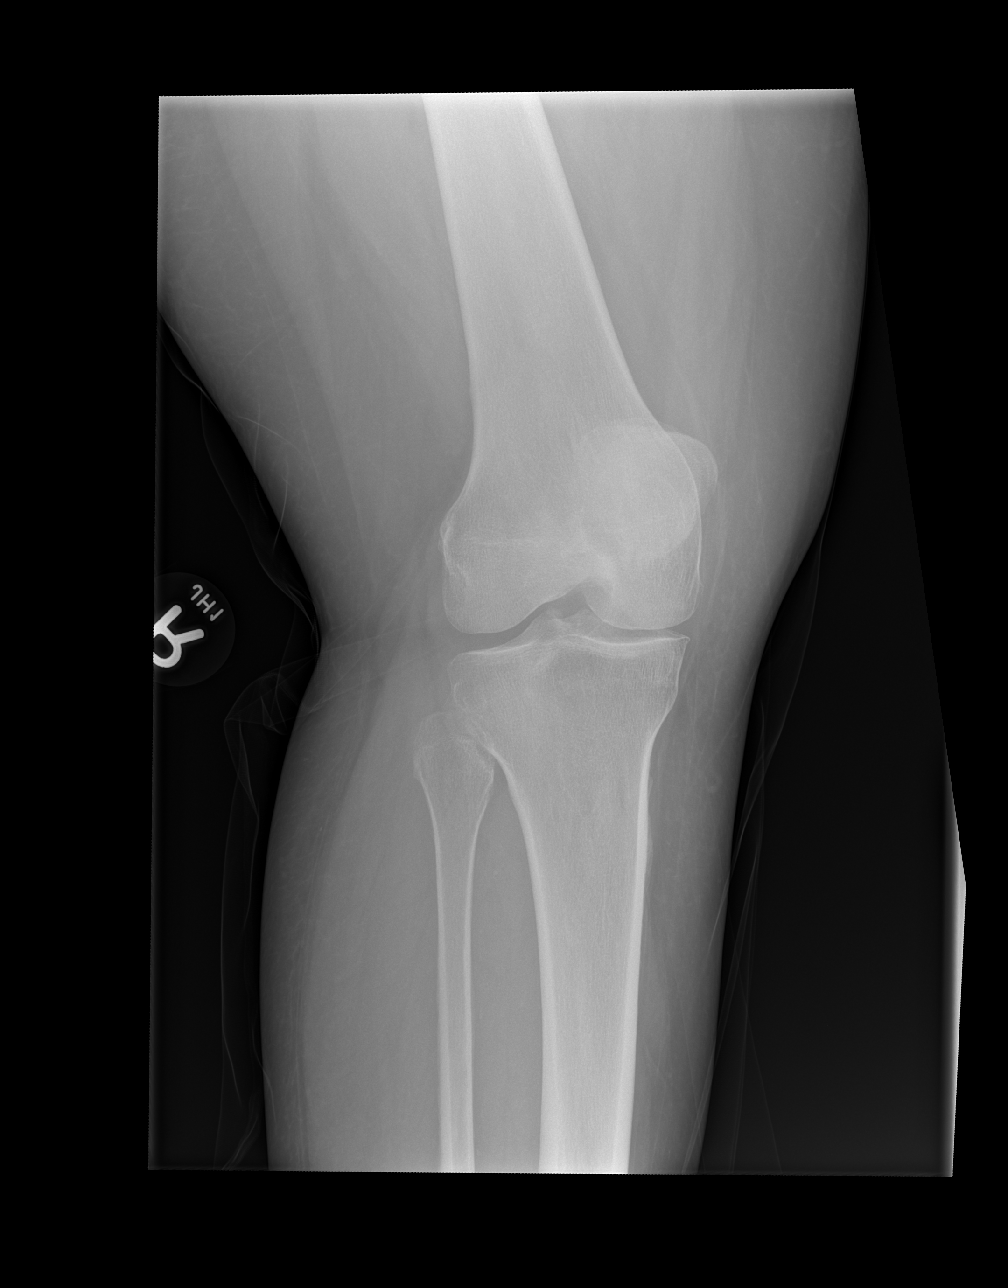

[x knee ap right (3 of 3)]
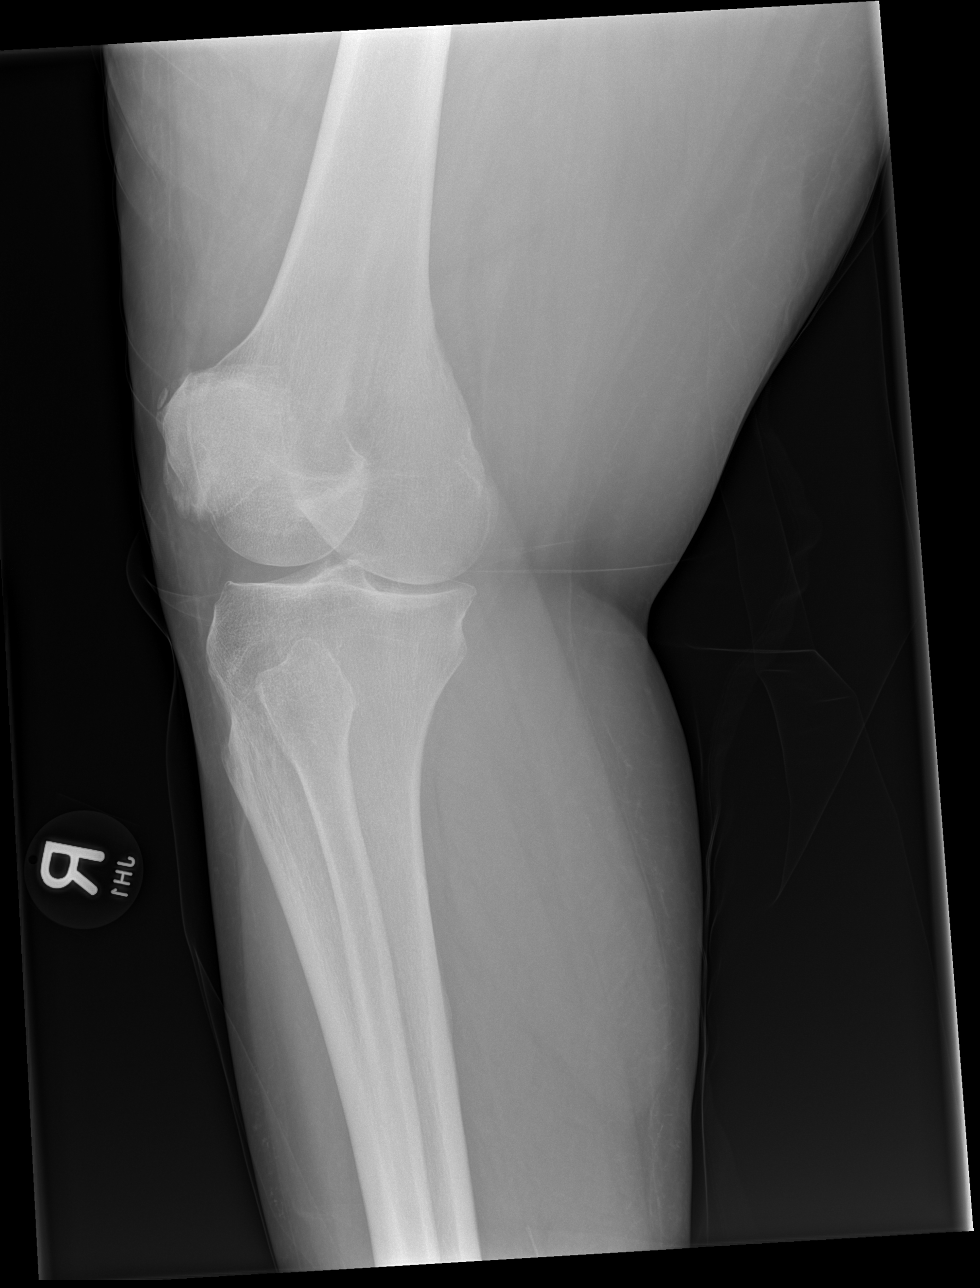

[x knee lat right]
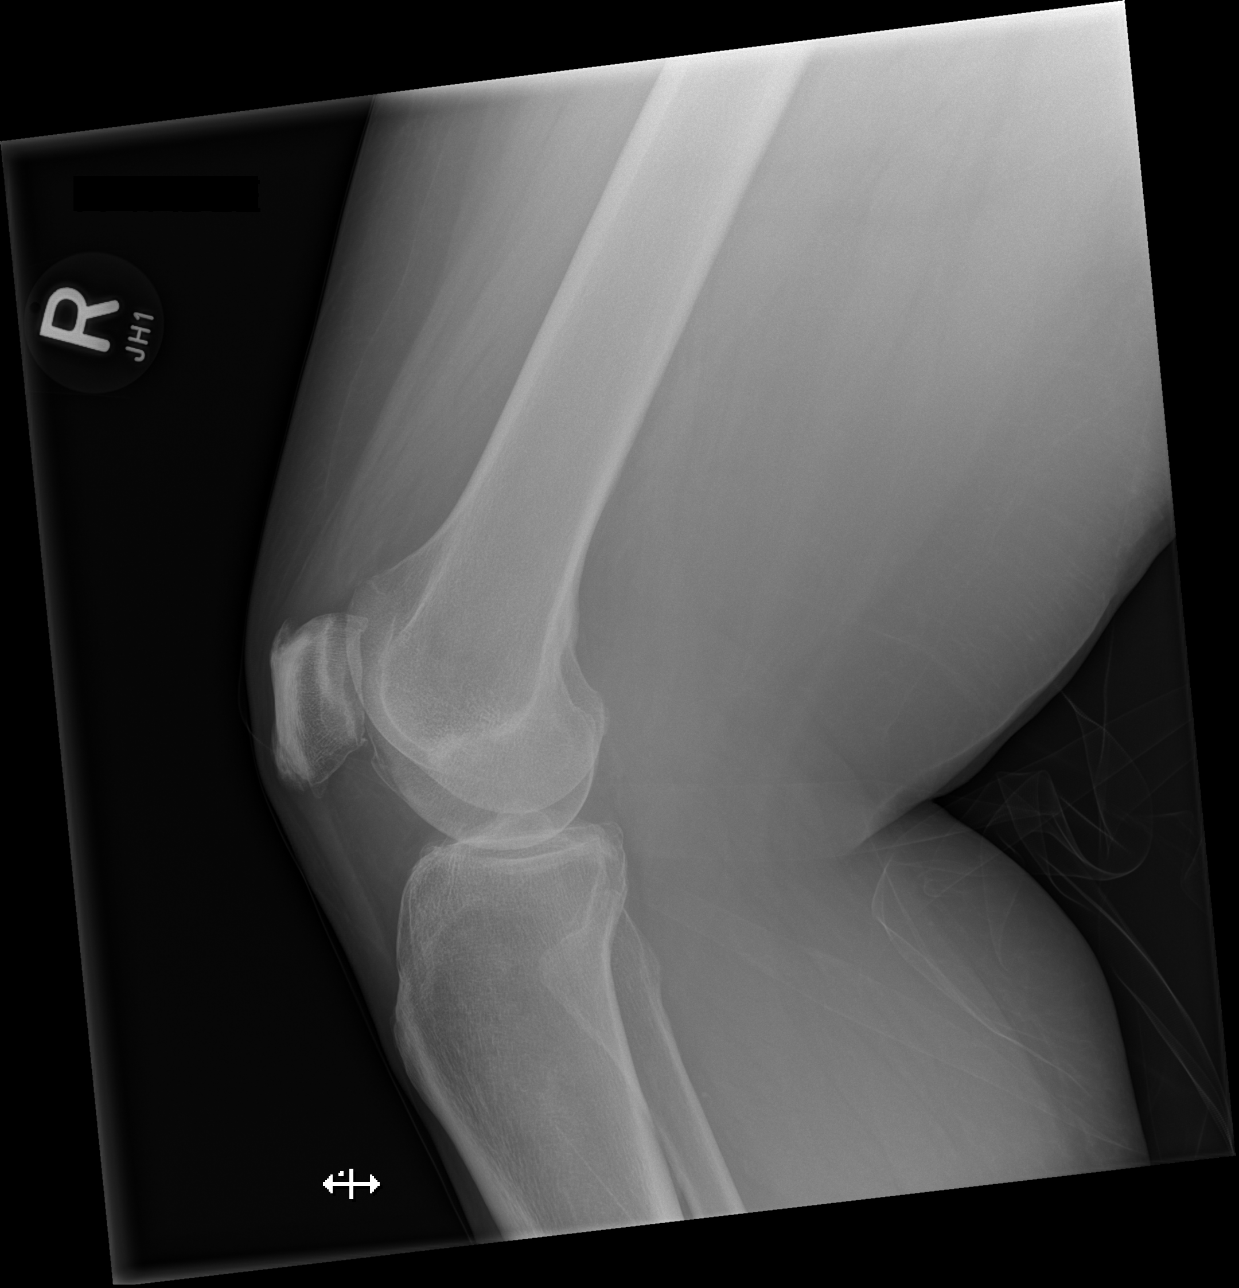

[4 of 4 positions shown; findings below may reference images not displayed]

FINDINGS: LEFT: Moderate medial and patellofemoral compartment narrowing with
periarticular sclerosis and marginal spurring. Tibial spine peaking.
No destructive bony lesions. Patellar enthesopathy. Soft tissue
planes are non suspicious.

RIGHT: Moderate medial and patellofemoral compartment narrowing with
periarticular sclerosis and marginal spurring. Tibial spine peaking.
No destructive bony lesions. Patellar enthesopathy. Soft tissue
planes are non suspicious.
IMPRESSION: 1. No acute fracture deformity or dislocation.
2. Moderate bilateral bicompartmental osteoarthrosis.

## 2018-02-02 IMAGING — CT CT TIBIA FIBULA *R* W/O CM
3 series · 10 of 33 positions shown, 11 images · non-contrast
Comparison: Radiographs [DATE]

CLINICAL DATA: Stepped in a hole this morning and injured leg.

EXAM:
CT OF THE RIGHT KNEE AND LOWER RIGHT EXTREMITY WITHOUT CONTRAST
TECHNIQUE: Multidetector CT imaging of the right lower extremity was performed
according to the standard protocol.

[Series 4: axial st · axial · 0.40mm/px · z∈[+292,+499]mm · 2 of 301 slices shown, 3 images]
[im 93/301  soft-tissue]
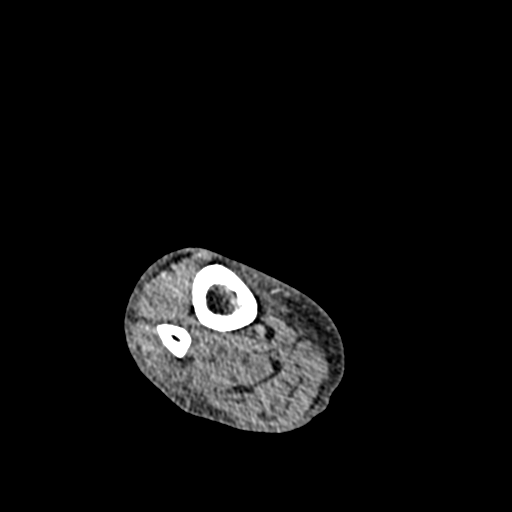
[im 93/301  bone]
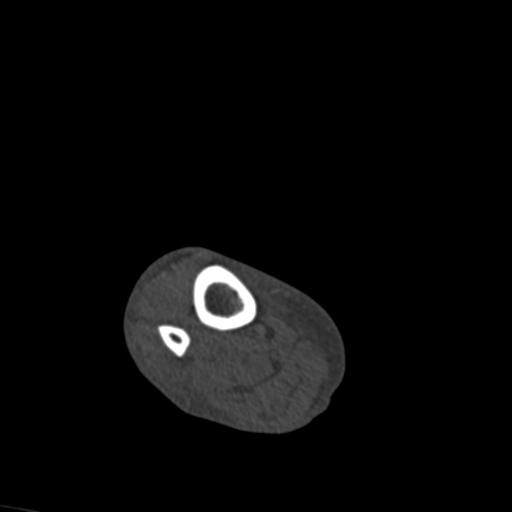
[im 231/301  bone]
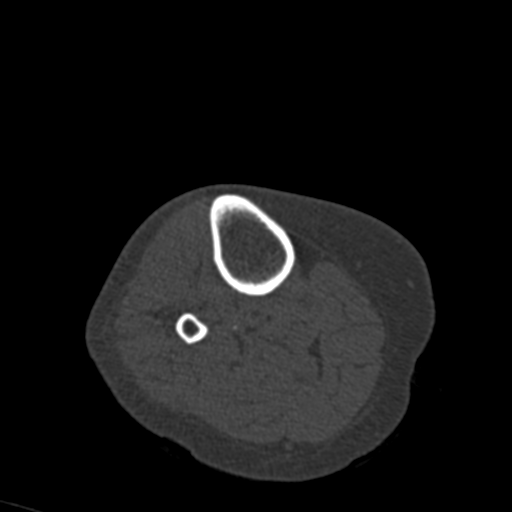

[Series 9: coronal st · coronal · 0.35mm/px · 3 of 90 slices shown]
[im 18/90  bone]
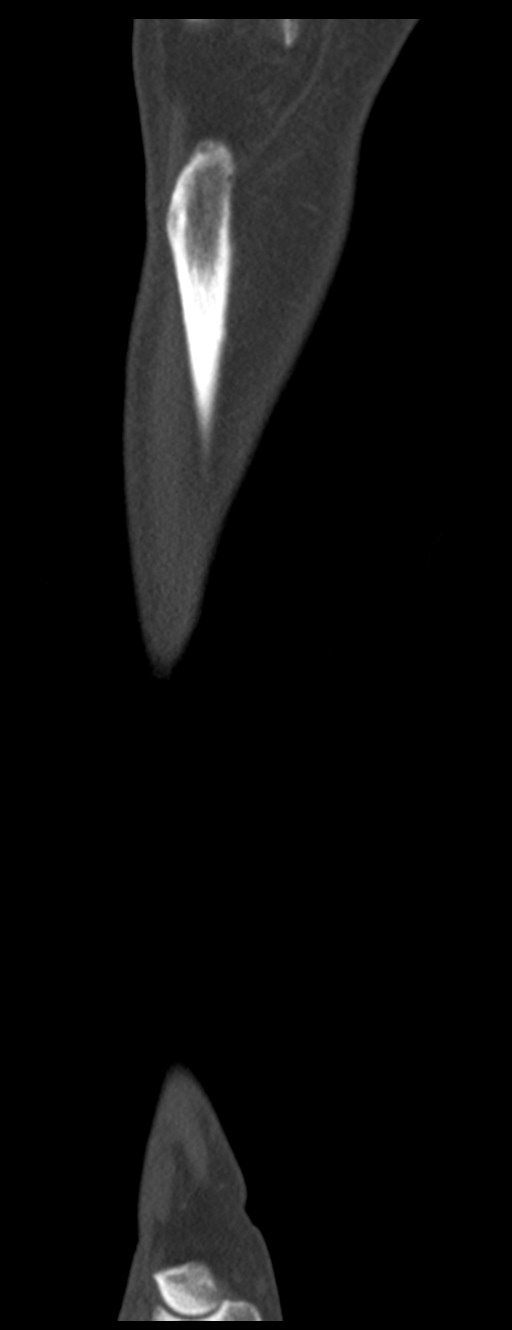
[im 36/90  bone]
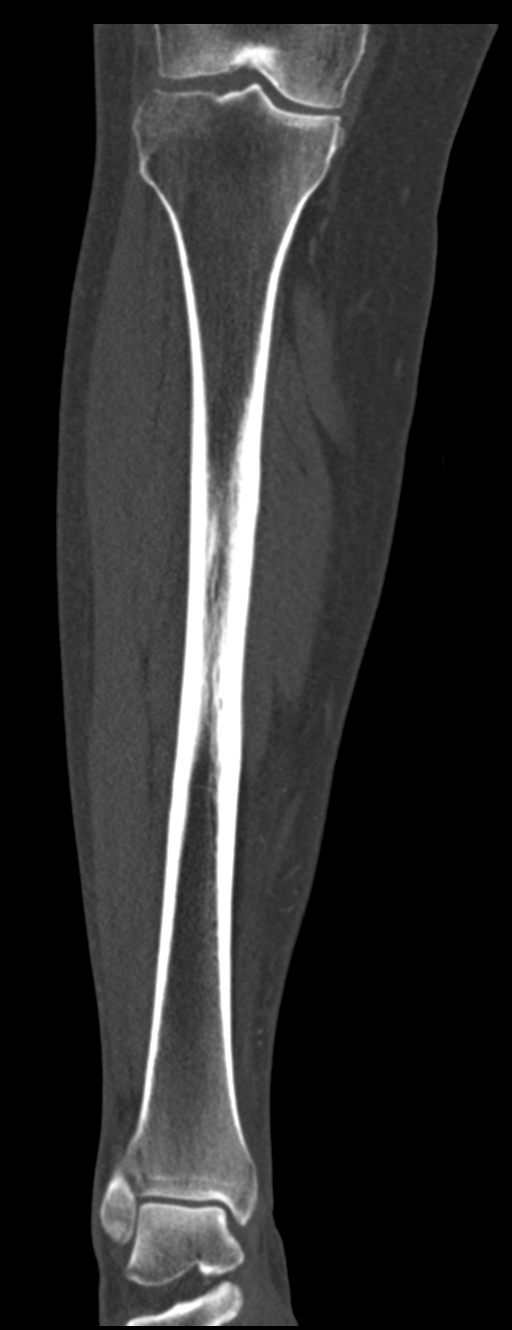
[im 54/90  bone]
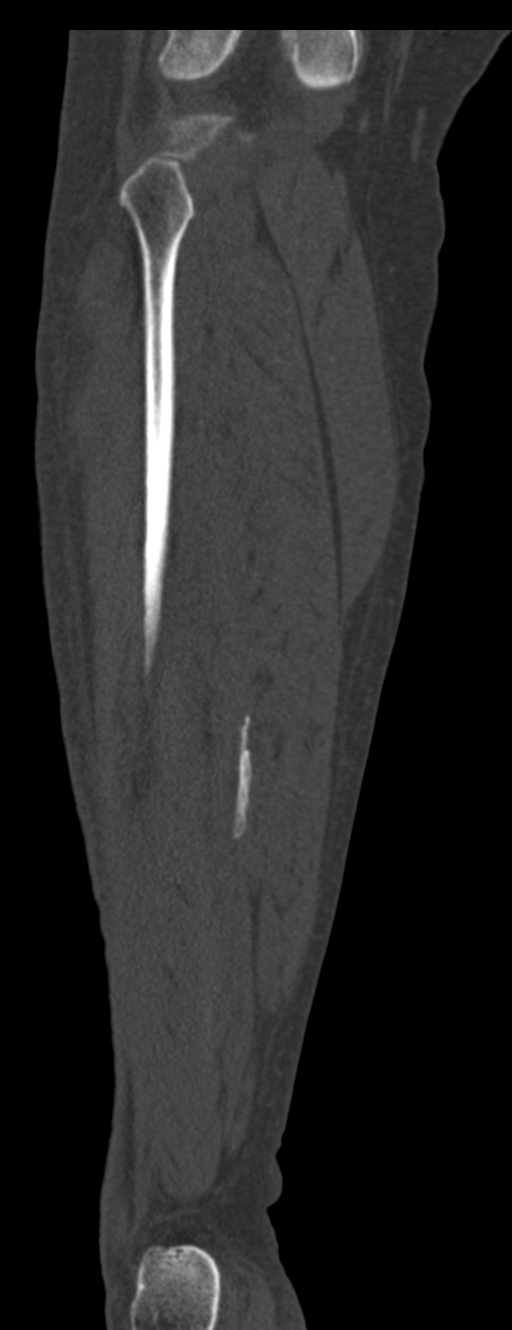

[Series 10: sagittal st · sagittal · 0.46mm/px · 5 of 107 slices shown]
[im 36/107  bone]
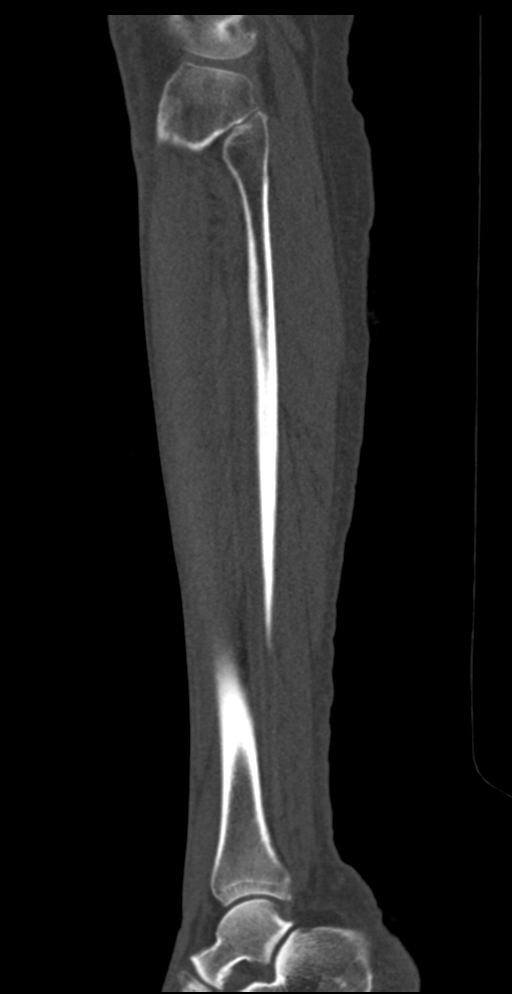
[im 45/107  bone]
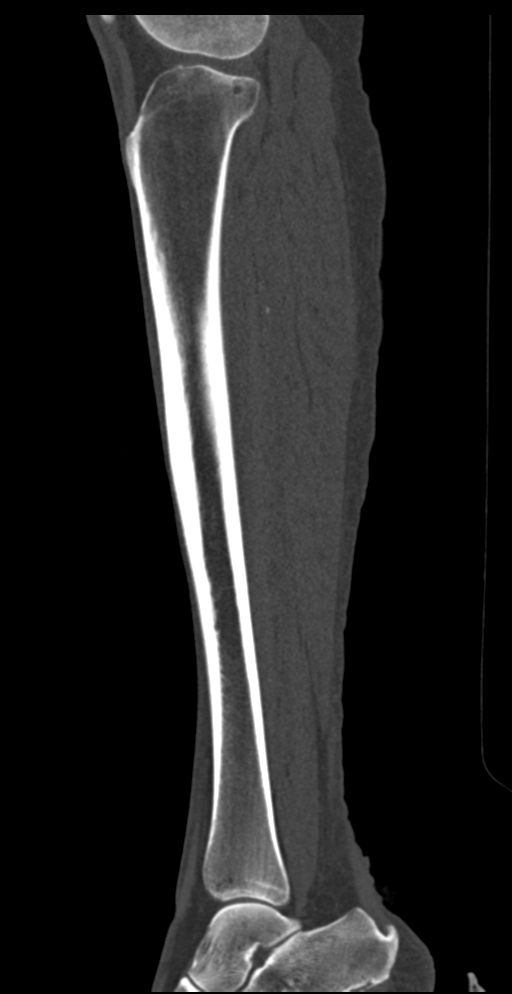
[im 54/107  bone]
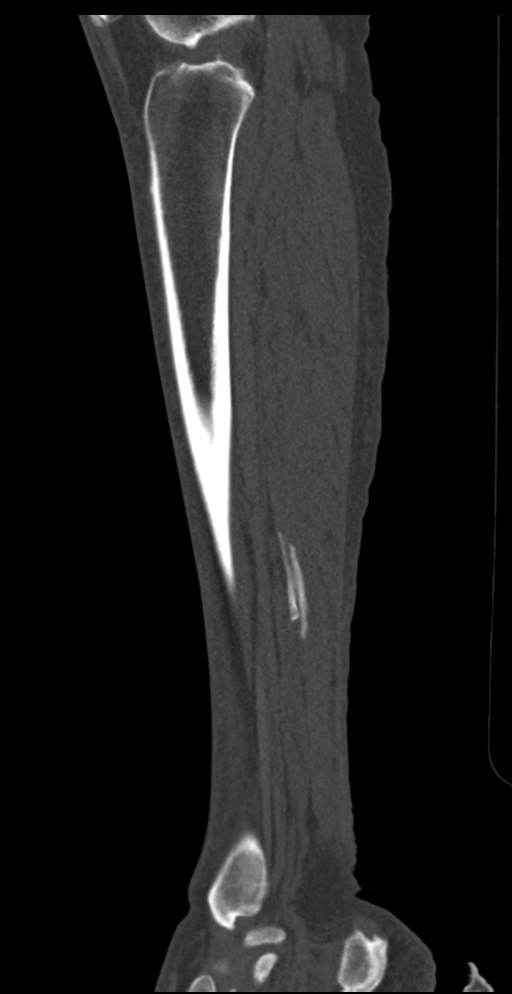
[im 62/107  bone]
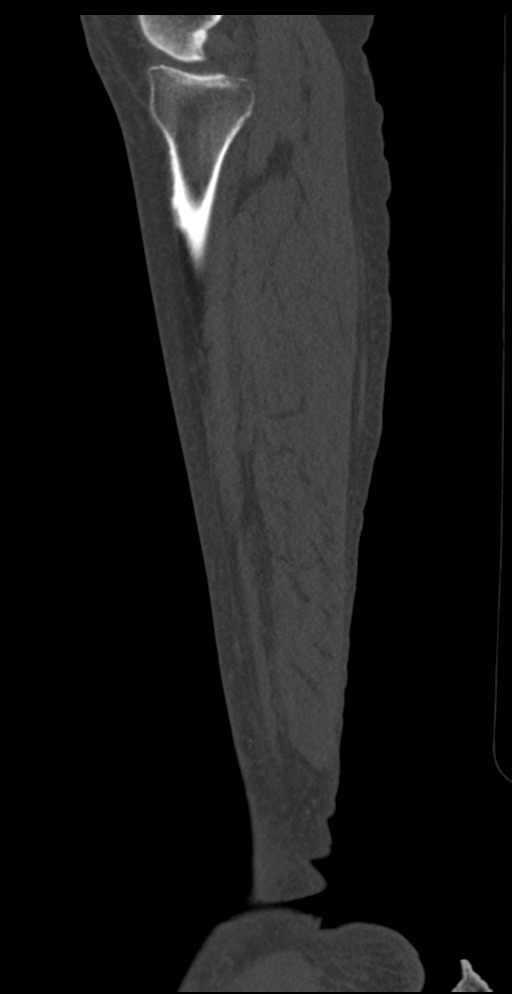
[im 71/107  bone]
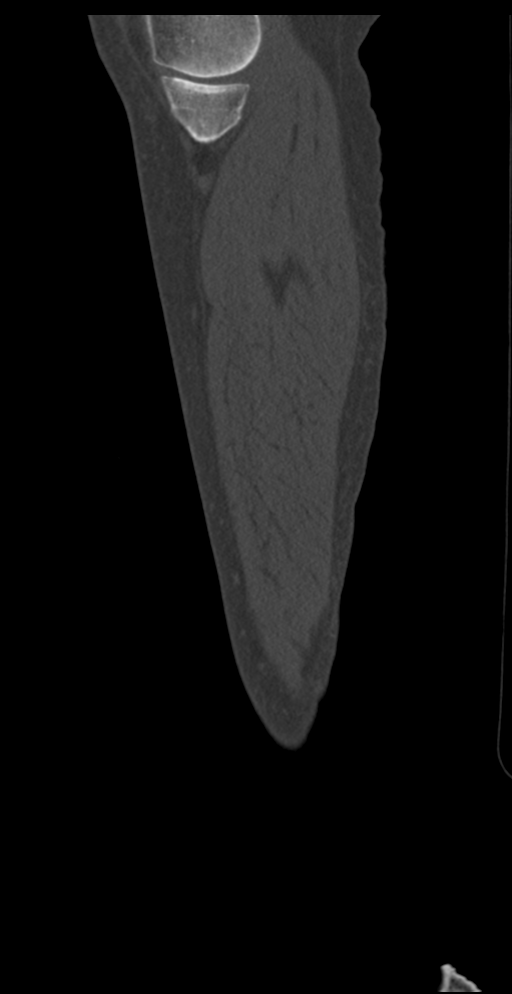

[10 of 33 positions shown; findings below may reference images not displayed]

FINDINGS: Mild-to-moderate degenerative changes noted at the knee and ankle
joints but no acute fractures identified. No knee joint effusion is
identified.

The quadriceps and patellar tendons are intact and the medial and
lateral collateral ligaments are grossly intact. The PCL appears
normal. I do not see the ACL very well but CT is limited for this
IMPRESSION: No acute fracture of the right knee or right tibia/fibula is
identified.

Moderate degenerative changes involving the knee joint.

## 2018-02-02 IMAGING — CT CT KNEE*R* W/O CM
2 of 4 series · 14 of 33 positions shown, 17 images · non-contrast
Comparison: Radiographs [DATE]

CLINICAL DATA: Stepped in a hole this morning and injured leg.

EXAM:
CT OF THE RIGHT KNEE AND LOWER RIGHT EXTREMITY WITHOUT CONTRAST
TECHNIQUE: Multidetector CT imaging of the right lower extremity was performed
according to the standard protocol.

[Series 4: axial st · axial · 0.38mm/px · z∈[+546,+642]mm · 11 of 76 slices shown, 14 images]
[im 6/76  soft-tissue]
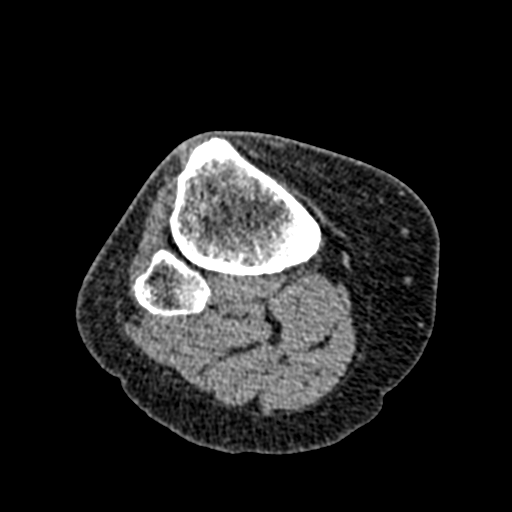
[im 6/76  bone]
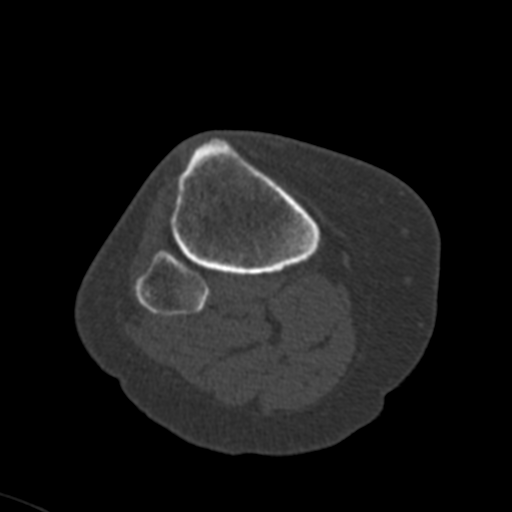
[im 12/76  bone]
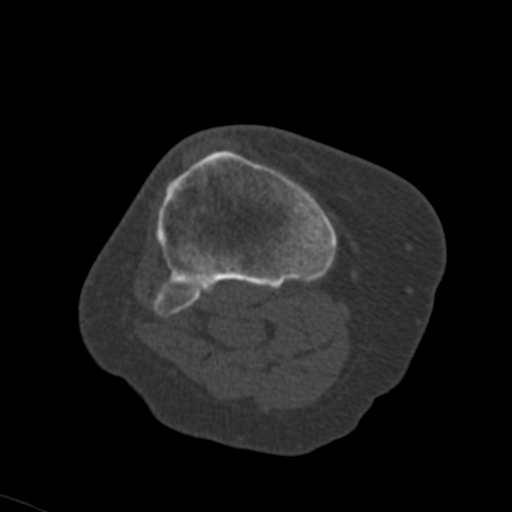
[im 18/76  bone]
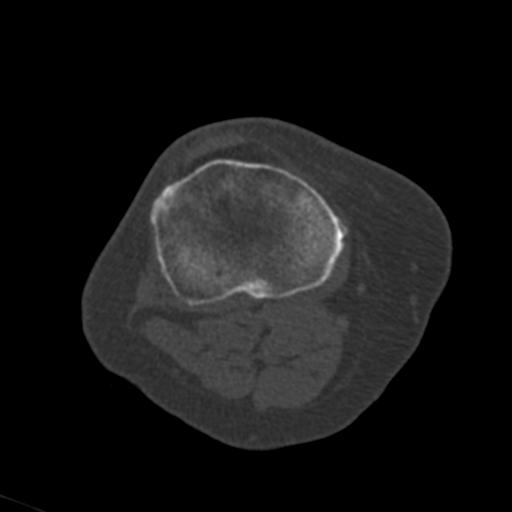
[im 24/76  bone]
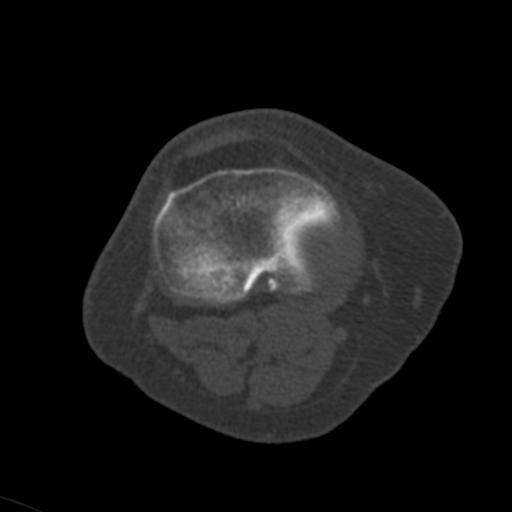
[im 29/76  soft-tissue]
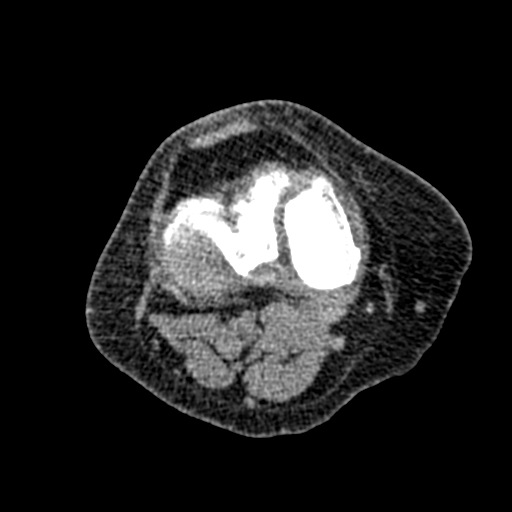
[im 29/76  bone]
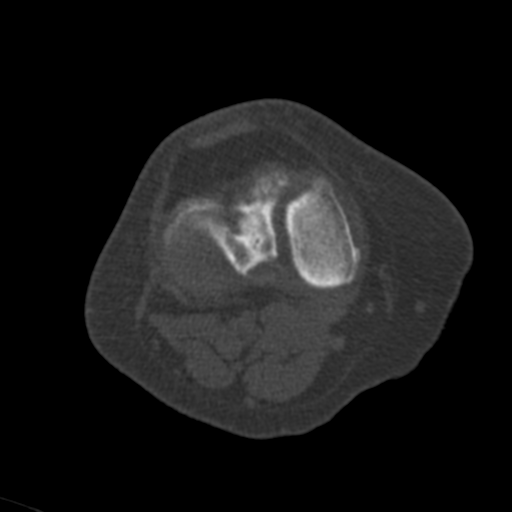
[im 41/76  bone]
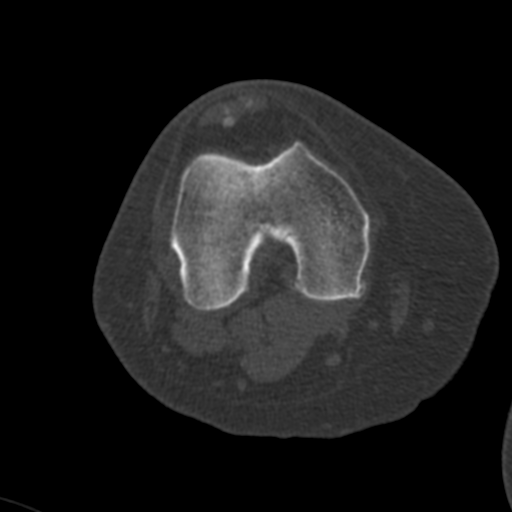
[im 47/76  bone]
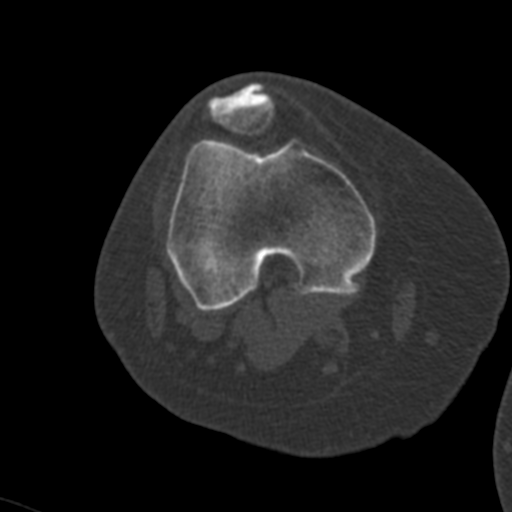
[im 52/76  bone]
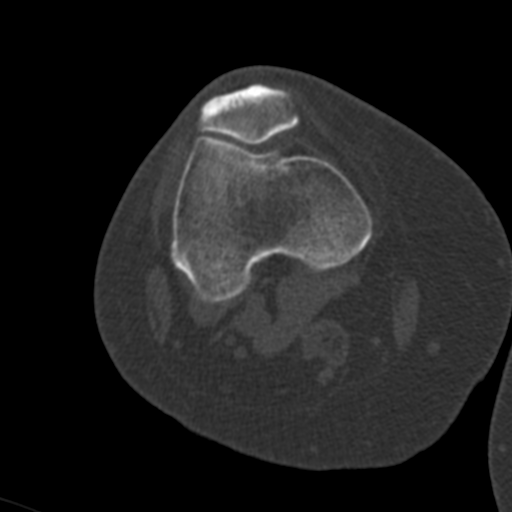
[im 58/76  soft-tissue]
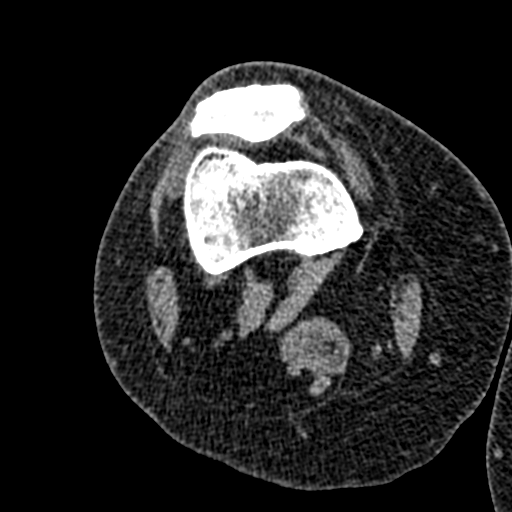
[im 58/76  bone]
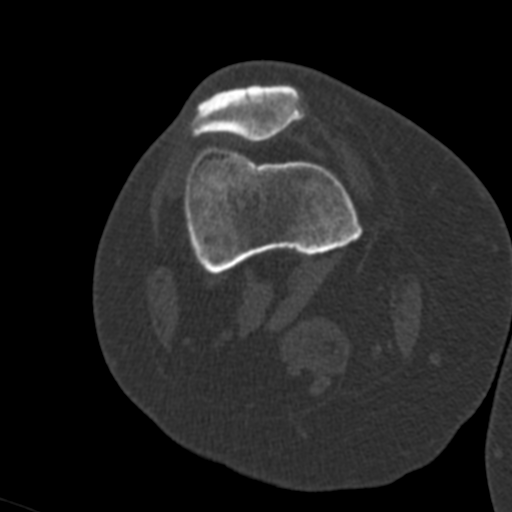
[im 64/76  bone]
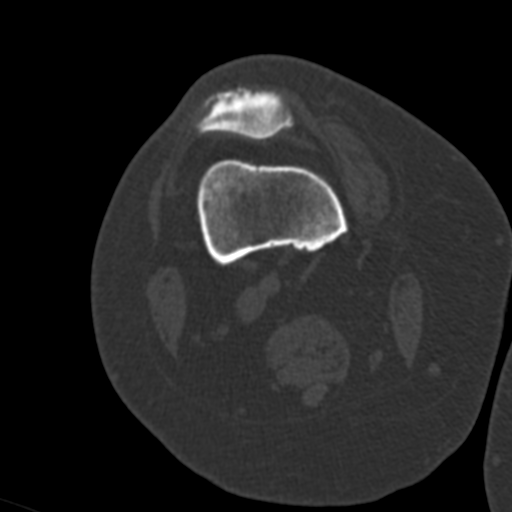
[im 70/76  bone]
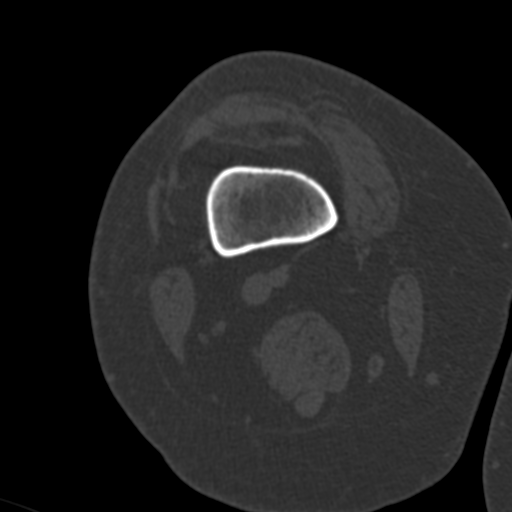

[Series 9: coronal st · coronal · 0.25mm/px · 3 of 96 slices shown]
[im 20/96  bone]
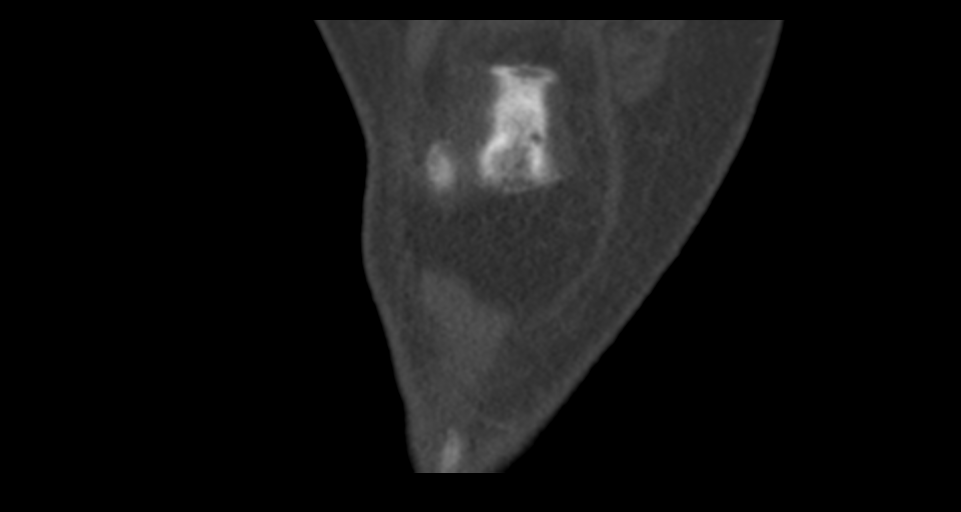
[im 39/96  bone]
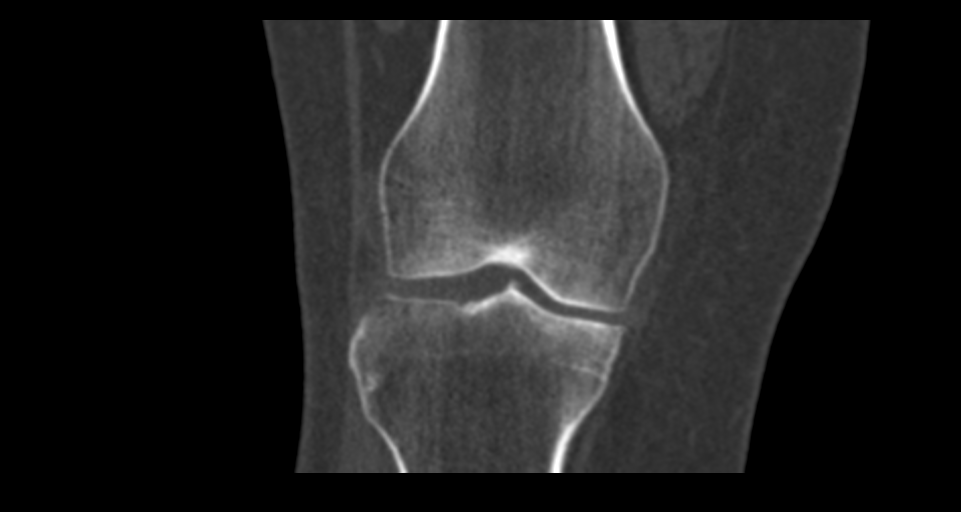
[im 58/96  bone]
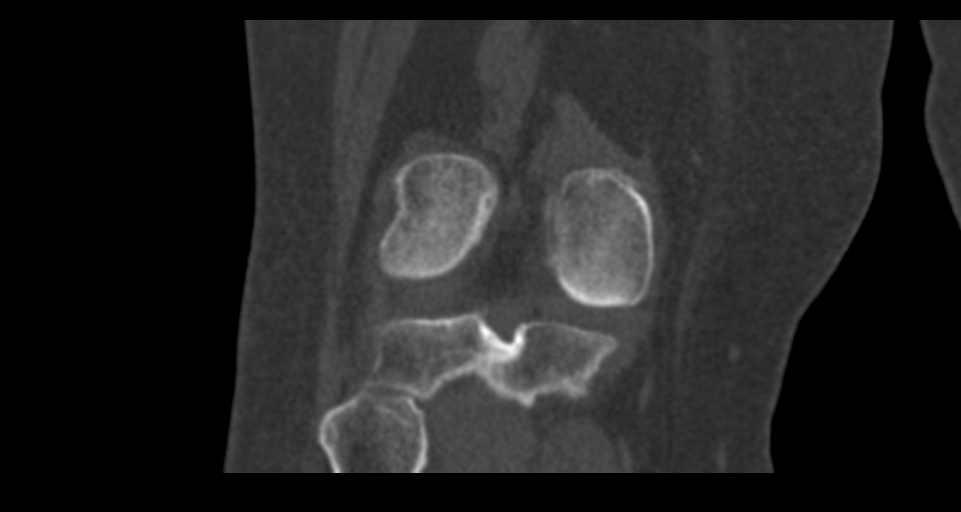

[14 of 33 positions shown; findings below may reference images not displayed]

FINDINGS: Mild-to-moderate degenerative changes noted at the knee and ankle
joints but no acute fractures identified. No knee joint effusion is
identified.

The quadriceps and patellar tendons are intact and the medial and
lateral collateral ligaments are grossly intact. The PCL appears
normal. I do not see the ACL very well but CT is limited for this
IMPRESSION: No acute fracture of the right knee or right tibia/fibula is
identified.

Moderate degenerative changes involving the knee joint.

## 2018-02-02 IMAGING — CR DG KNEE COMPLETE 4+V*L*
4 series · 4 of 4 positions shown · non-contrast
Comparison: None.

CLINICAL DATA: Fell this morning. RIGHT greater than LEFT
generalized knee pain.

EXAM:
RIGHT KNEE - COMPLETE 4+ VIEW; LEFT KNEE - COMPLETE 4+ VIEW

[x knee ap left (1 of 3)]
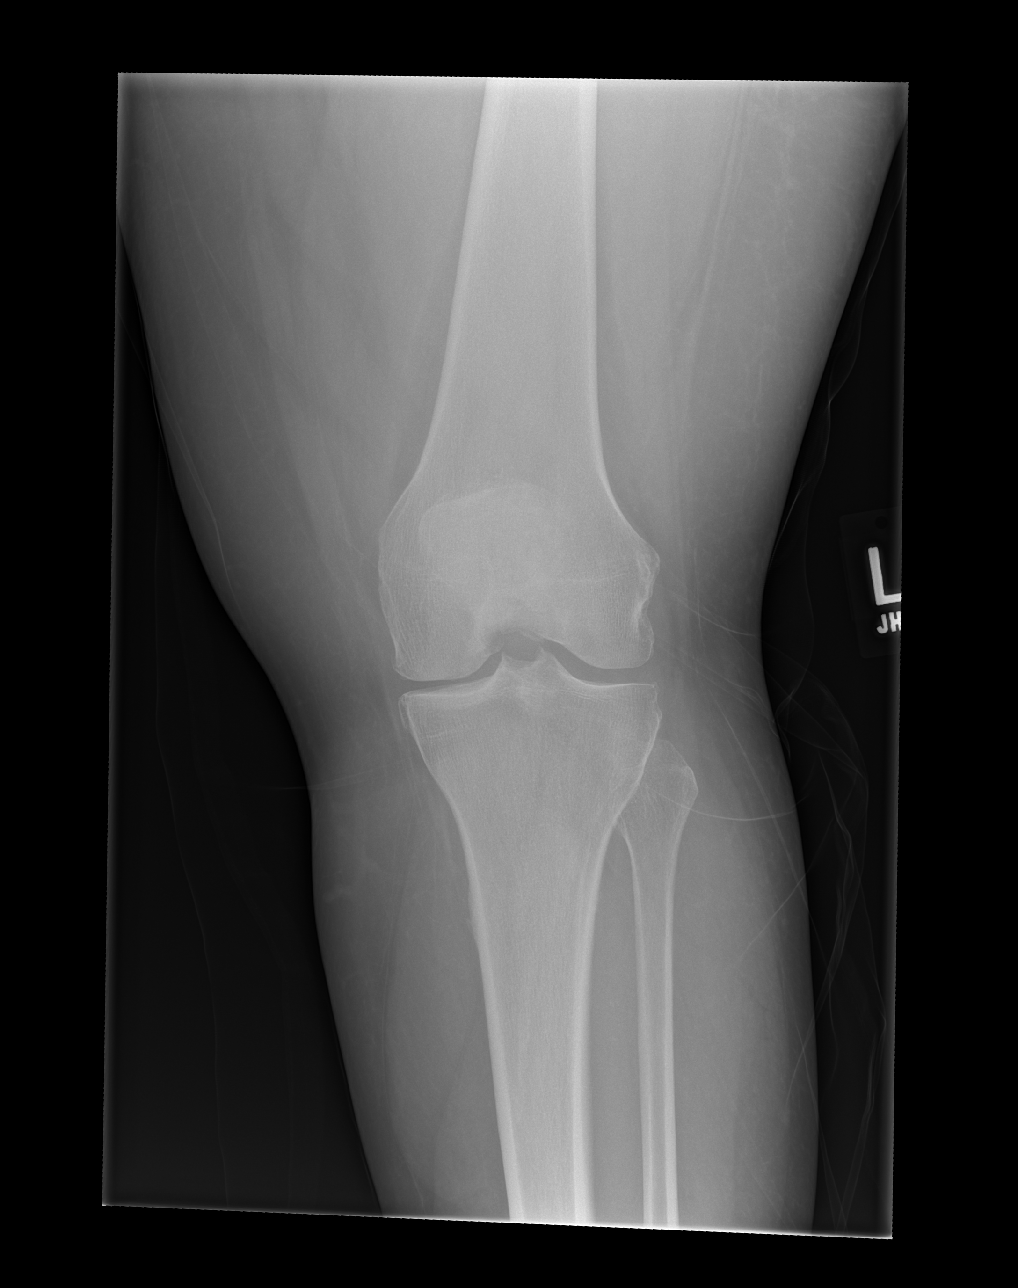

[x knee ap left (2 of 3)]
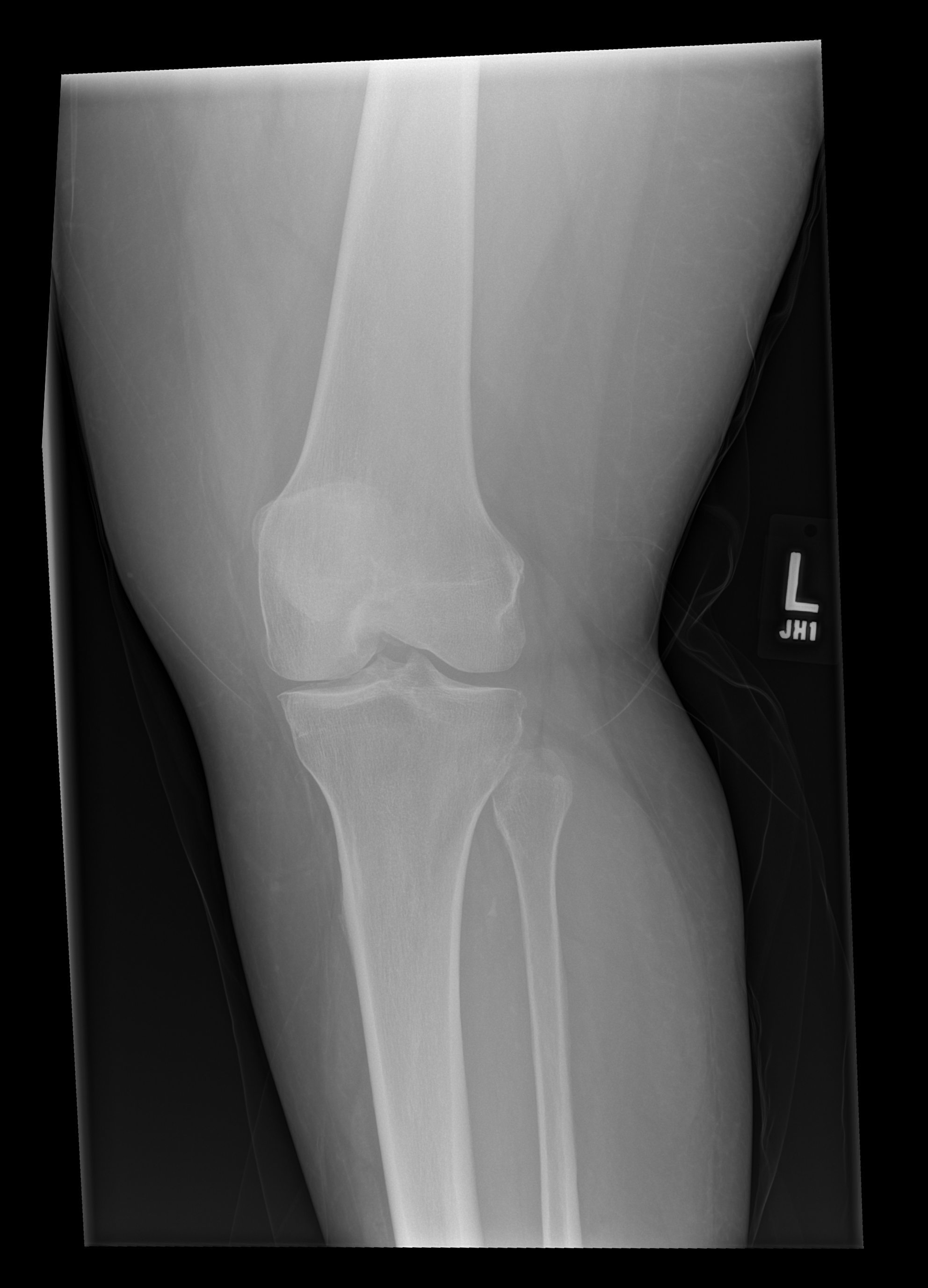

[x knee ap left (3 of 3)]
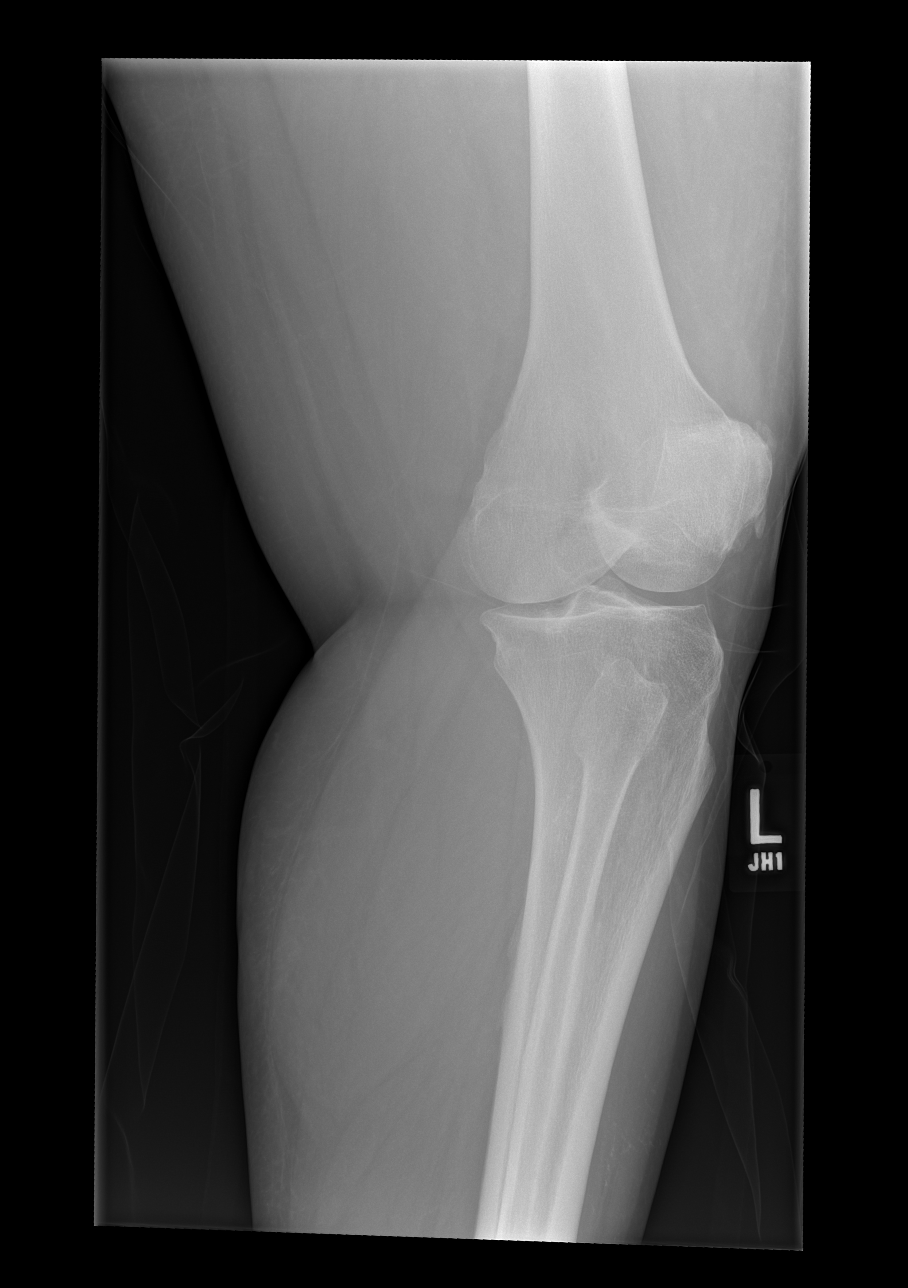

[x knee lat left]
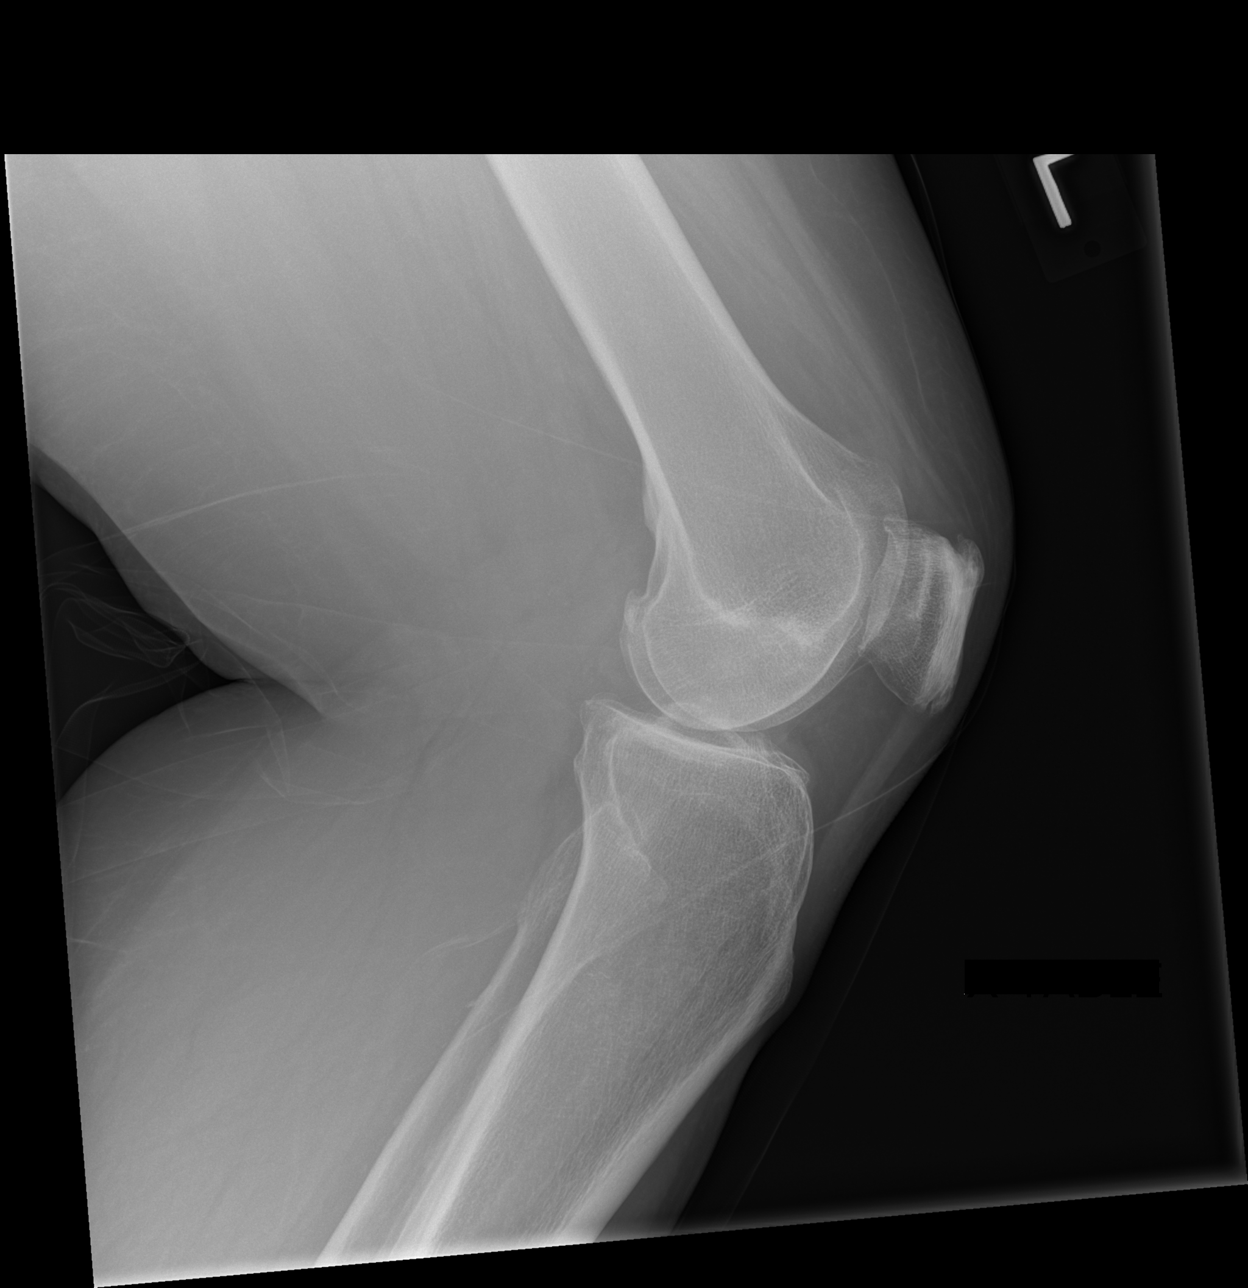

[4 of 4 positions shown; findings below may reference images not displayed]

FINDINGS: LEFT: Moderate medial and patellofemoral compartment narrowing with
periarticular sclerosis and marginal spurring. Tibial spine peaking.
No destructive bony lesions. Patellar enthesopathy. Soft tissue
planes are non suspicious.

RIGHT: Moderate medial and patellofemoral compartment narrowing with
periarticular sclerosis and marginal spurring. Tibial spine peaking.
No destructive bony lesions. Patellar enthesopathy. Soft tissue
planes are non suspicious.
IMPRESSION: 1. No acute fracture deformity or dislocation.
2. Moderate bilateral bicompartmental osteoarthrosis.

## 2018-02-02 MED ORDER — AMLODIPINE BESYLATE 5 MG PO TABS
5.0000 mg | ORAL_TABLET | Freq: Once | ORAL | Status: AC
  Administered 2018-02-02: 5 mg via ORAL
  Filled 2018-02-02: qty 1

## 2018-02-02 MED ORDER — HYDROCHLOROTHIAZIDE 25 MG PO TABS
25.0000 mg | ORAL_TABLET | Freq: Once | ORAL | Status: AC
  Administered 2018-02-02: 25 mg via ORAL
  Filled 2018-02-02: qty 1

## 2018-02-02 MED ORDER — IBUPROFEN 800 MG PO TABS
800.0000 mg | ORAL_TABLET | Freq: Once | ORAL | Status: AC
  Administered 2018-02-02: 800 mg via ORAL
  Filled 2018-02-02: qty 1

## 2018-02-02 NOTE — ED Notes (Signed)
Patient verbalized understanding of discharge instructions, no questions. Patient out of ED via wheelchair in no distress.  

## 2018-02-02 NOTE — ED Notes (Signed)
Attempted to ambulated patient, patient unable to bear weight to right leg. PA aware.

## 2018-02-02 NOTE — ED Triage Notes (Signed)
Pt arrived via [facility] due to a fall, fell and hit both knees.

## 2018-02-02 NOTE — ED Provider Notes (Signed)
[facility] DEPT Provider Note  CSN: [id number] Arrival date & time: 02/02/18  0605    History   Chief Complaint Chief Complaint  Patient presents with  . Fall    HPI [name] is a 57 y.o. female with a medical history of HTN, Type 2 DM and GERD who presented to the ED following a fall. Patient reports stepping into a hole in her yard while walking to the car and states that she landed forward on her knees on to concrete. Endores bilateral knee pain (right > left) and describes it as constant, aching and severe. Pain worse with flexion and palpation. No relieving factors. Denies color or temperature changes in extremity, paresthesias or weakness. Patient has not tried to ambulate since accident and called EMS to transport her to the ED.  Past Medical History:  Diagnosis Date  . Diabetes mellitus   . GERD (gastroesophageal reflux disease)   . Hyperlipidemia   . Hypertension   . Nausea     Patient Active Problem List   Diagnosis Date Noted  . Hypertensive urgency 10/15/2017  . Skin candidiasis 10/15/2017  . Venous (peripheral) insufficiency 08/06/2017  . Health care maintenance 01/01/2016  . Essential hypertension   . 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 Diabetic gastroparesis ([place]) 08/09/2015  . Accelerated hypertension   . Type 2 diabetes mellitus without complication, without long-term current use of insulin ([place])   . Low back pain 06/09/2014  . Obstructive sleep apnea of adult 01/16/2012  . GERD (gastroesophageal reflux disease) 01/16/2012  . Osteoarthritis of both knees 01/16/2012  . Hypercholesteremia 07/18/2011  . LEIOMYOMA, UTERUS 01/14/2007  . Morbid obesity ([place]) 07/03/2006  . Former smoker 07/03/2006  . Tension headache 07/03/2006  . HYPERTENSION, BENIGN SYSTEMIC 07/03/2006    Past Surgical History:  Procedure Laterality Date  . BREAST SURGERY     reduction  . CESAREAN SECTION     x2  .  CHOLECYSTECTOMY     laparoscopic  . REDUCTION MAMMAPLASTY Bilateral      OB History   None      Home Medications    Prior to Admission medications   Medication Sig Start Date End Date Taking? Authorizing Provider  amLODipine (NORVASC) 10 MG tablet TAKE 1 TABLET BY MOUTH ONCE DAILY 01/06/18   Diallo, Earna Coder, MD  amLODipine (NORVASC) 5 MG tablet Take 1 tablet (5 mg total) by mouth daily. 10/15/17   Mercy Riding, MD  atorvastatin (LIPITOR) 20 MG tablet TAKE 1 TABLET BY MOUTH ONCE DAILY AT  [facility] 09/16/17   [name], MD  Blood Glucose Monitoring Suppl (ACCU-CHEK AVIVA PLUS) w/Device KIT Use to check sugar three times a day 05/14/17   [name], MD  carvedilol (COREG) 25 MG tablet Take 1 tablet (25 mg total) by mouth 2 (two) times daily with a meal. 06/23/17   [name], MD  docusate sodium (COLACE) 100 MG capsule Take 1 capsule (100 mg total) by mouth 2 (two) times daily. 08/06/17   [name], MD  empagliflozin (JARDIANCE) 25 MG TABS tablet Take 25 mg by mouth daily. 08/06/17   [name], MD  glucose blood (ACCU-CHEK AVIVA PLUS) test strip Use as instructed 05/14/17   [name], MD  hydrochlorothiazide (HYDRODIURIL) 12.5 MG tablet TAKE 1 TABLET BY MOUTH ONCE DAILY 01/06/18   Diallo, Earna Coder, MD  hydrochlorothiazide (HYDRODIURIL) 25 MG tablet Take 1 tablet (25 mg total) by mouth  daily. 10/15/17   Mercy Riding, MD  Lancets (ACCU-CHEK SOFT TOUCH) lancets Use to check sugars three times a day 05/14/17   [name], MD  metFORMIN (GLUCOPHAGE) 1000 MG tablet Take 1 tablet (1,000 mg total) by mouth 2 (two) times daily with a meal. 06/23/17   [name], MD  nystatin cream (MYCOSTATIN) Apply 1 application topically 2 (two) times daily. 10/15/17   Mercy Riding, MD  pantoprazole (PROTONIX) 40 MG tablet Take 1 tablet (40 mg total) by mouth daily. 10/26/15   Esterwood, Amy S, PA-C  polyethylene glycol (MIRALAX / GLYCOLAX) packet  Take 17 g by mouth daily as needed for moderate constipation. 08/06/17   [name], MD  traMADol (ULTRAM) 50 MG tablet Take 1 tablet (50 mg total) by mouth every 8 (eight) hours as needed. 09/30/17   [name], MD    Family History Family History  Problem Relation Age of Onset  . Hypertension Mother   . Diabetes Mother   . Cancer Father        lung  . Colon cancer Neg Hx   . Stomach cancer Neg Hx   . Esophageal cancer Neg Hx     Social History Social History   Tobacco Use  . Smoking status: Former Smoker    Years: 10.00    Types: Cigarettes    Last attempt to quit: 10/16/2011    Years since quitting: 6.3  . Smokeless tobacco: Never Used  . Tobacco comment: 1 pack will last a week  Substance Use Topics  . Alcohol use: No    Alcohol/week: 0.0 standard drinks  . Drug use: No     Allergies   Lisinopril   Review of Systems Review of Systems  Constitutional: Negative.   Musculoskeletal: Positive for arthralgias and gait problem.  Skin: Negative.   Neurological: Negative for weakness and numbness.  Hematological: Negative.    Physical Exam Updated Vital Signs BP (!) 197/100   Pulse 82   Temp 98.2 F (36.8 C) (Oral)   Resp 18   Ht '5\' 9"'$  (1.753 m)   Wt 120.2 kg   SpO2 97%   BMI 39.13 kg/m   Physical Exam  Constitutional:  Obese  Cardiovascular:  Pulses:      Dorsalis pedis pulses are 2+ on the right side, and 2+ on the left side.       Posterior tibial pulses are 2+ on the right side, and 2+ on the left side.  Musculoskeletal:       Right hip: Normal.       Left hip: Normal.       Right knee: She exhibits decreased range of motion and bony tenderness. She exhibits no effusion. Tenderness found.       Left knee: She exhibits normal range of motion and no bony tenderness. Tenderness found.       Right ankle: Normal.       Left ankle: Normal.       Legs: Right lower extremity tender to palpation inferior to patella/patellar tendon >  left. Significant pain with flexion. No palpable effusion due to body habitus. Negative ligamental laxity tests in knees bilaterally.  Skin: Skin is warm and intact. Capillary refill takes less than 2 seconds. No abrasion and no bruising noted.  Nursing note and vitals reviewed.  ED Treatments / Results  Labs (all labs ordered are listed, but only abnormal results are displayed) Labs Reviewed - No data to display  EKG None  Radiology Dg Knee Complete 4 Views Left  Result Date: 02/02/2018 CLINICAL DATA:  [place] this morning. RIGHT greater than LEFT generalized knee pain. EXAM: RIGHT KNEE - COMPLETE 4+ VIEW; LEFT KNEE - COMPLETE 4+ VIEW COMPARISON:  None. FINDINGS: LEFT: Moderate medial and patellofemoral compartment narrowing with periarticular sclerosis and marginal spurring. Tibial spine peaking. No destructive bony lesions. Patellar enthesopathy. Soft tissue planes are non suspicious. RIGHT: Moderate medial and patellofemoral compartment narrowing with periarticular sclerosis and marginal spurring. Tibial spine peaking. No destructive bony lesions. Patellar enthesopathy. Soft tissue planes are non suspicious. IMPRESSION: 1. No acute fracture deformity or dislocation. 2. Moderate bilateral bicompartmental osteoarthrosis. Electronically Signed   By: Elon Alas M.D.   On: 02/02/2018 06:45   Dg Knee Complete 4 Views Right  Result Date: 02/02/2018 CLINICAL DATA:  [place] this morning. RIGHT greater than LEFT generalized knee pain. EXAM: RIGHT KNEE - COMPLETE 4+ VIEW; LEFT KNEE - COMPLETE 4+ VIEW COMPARISON:  None. FINDINGS: LEFT: Moderate medial and patellofemoral compartment narrowing with periarticular sclerosis and marginal spurring. Tibial spine peaking. No destructive bony lesions. Patellar enthesopathy. Soft tissue planes are non suspicious. RIGHT: Moderate medial and patellofemoral compartment narrowing with periarticular sclerosis and marginal spurring. Tibial spine peaking. No destructive  bony lesions. Patellar enthesopathy. Soft tissue planes are non suspicious. IMPRESSION: 1. No acute fracture deformity or dislocation. 2. Moderate bilateral bicompartmental osteoarthrosis. Electronically Signed   By: Elon Alas M.D.   On: 02/02/2018 06:45    Procedures Procedures (including critical care time)  Medications Ordered in ED Medications - No data to display   Initial Impression / Assessment and Plan / ED Course  Triage vital signs and the nursing notes have been reviewed.  Pertinent labs & imaging results that were available during care of the patient were reviewed and considered in medical decision making (see chart for details).  Patient presents following a mechanical fall where she landed on her knees on to concrete. She has bilaterally knee pain with right > left and has significant pain with flexion and extension. Patient has full sensation in lower extremities. No deformities, decreased muscle tone or other abnormalities visualized. Neurovascular function is intact. Patient has not attempted to ambulate since the fall ~ 45 minute prior to arrival. There are no other physical exam findings or s/s that suggest an underlying infectious or rheumatologic process that warrant further evaluation or intervention today. Will begin with basic x-ray imaging to evaluate for injury.  Clinical Course as of Feb 03 912  Mon Feb 02, 2018  7026 Knee x-rays normal. No fractures or dislocations. Osteoarthritic changes seen.  Patient has significant pain on exam with flexion and has not attempted to ambulate. Increased concern for tibial plateau fracture. Will ask RN to try to ambulate patient.   [GM]  0710 Hypertensive upon arrival. Documented diagnosis of HTN, but patient has not taken today's dose of antihypertensive.   [GM]  [phone] RN alerted this provider that patient unable to bear weight. Will order CT to further evaluate for injury such as tibial plateau. Will order pt's home BP  medications since she will be here for longer than expected.   [GM]  8850 CT resulted and showed no fractures of tib/fib or knee.   [GM]    Clinical Course User Index [GM] [place] Osentoski, Jonelle Sports, PA-C    Final Clinical Impressions(s) / ED Diagnoses  1. Bilateral Knee Pain. Education provided on OTC and supportive treatment for pain relief and swelling. Advised to follow-up  with PCP.  Dispo: Home. After thorough clinical evaluation, this patient is determined to be medically stable and can be safely discharged with the previously mentioned treatment and/or outpatient follow-up/referral(s). At this time, there are no other apparent medical conditions that require further screening, evaluation or treatment.   Final diagnoses:  Acute pain of both knees    ED Discharge Orders    None        Junita Push 02/02/18 3794    Shanon Rosser, MD 02/02/18 2241

## 2018-02-02 NOTE — Discharge Instructions (Addendum)
Both of your x-rays and CT scans are normal. There are no fractures or dislocations seen.  As we discussed, you will be sore for the next few days. I encourage you to try to walk as much as you can on your own. You may use Tylenol and/or Ibuprofen for pain relief and swelling. When you are home resting, keep your leg elevated. You may ice your knee in 15-20 minute intervals for additional relief.   Thank you for allowing me to take care of you today!

## 2018-02-17 ENCOUNTER — Other Ambulatory Visit: Payer: Self-pay | Admitting: Internal Medicine

## 2018-03-25 ENCOUNTER — Other Ambulatory Visit: Payer: Self-pay | Admitting: Internal Medicine

## 2018-03-27 ENCOUNTER — Other Ambulatory Visit: Payer: Self-pay | Admitting: Nephrology

## 2018-03-27 DIAGNOSIS — N183 Chronic kidney disease, stage 3 unspecified: Secondary | ICD-10-CM

## 2018-03-27 DIAGNOSIS — R809 Proteinuria, unspecified: Secondary | ICD-10-CM

## 2018-03-31 ENCOUNTER — Ambulatory Visit
Admission: RE | Admit: 2018-03-31 | Discharge: 2018-03-31 | Disposition: A | Discharge status: Home / Self Care | Payer: BC Managed Care – PPO | Source: Ambulatory Visit | Attending: Nephrology | Admitting: Nephrology

## 2018-03-31 DIAGNOSIS — R809 Proteinuria, unspecified: Secondary | ICD-10-CM

## 2018-03-31 DIAGNOSIS — N183 Chronic kidney disease, stage 3 unspecified: Secondary | ICD-10-CM

## 2018-03-31 IMAGING — US US RENAL
2 series · 14 of 25 positions shown · non-contrast
Comparison: CT of the abdomen and pelvis [DATE]

CLINICAL DATA: Chronic renal disease, stage III. Proteinuria,
unspecified type.

EXAM:
RENAL / URINARY TRACT ULTRASOUND COMPLETE

[Series 1: us renal · 0.25mm/px · 13 of 76 slices shown (1 of 2)]
[im 1/76]
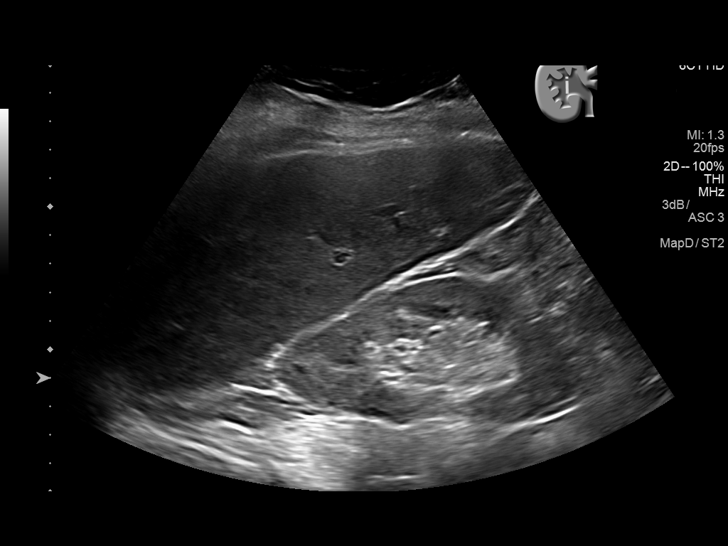
[im 7/76]
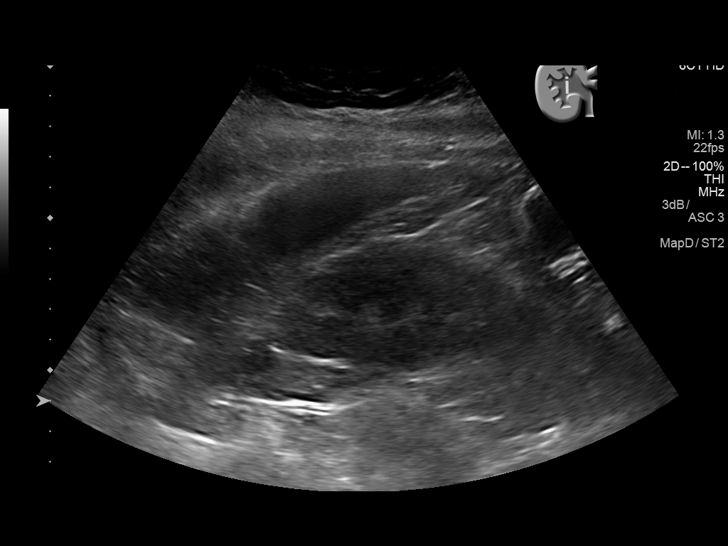
[im 14/76]
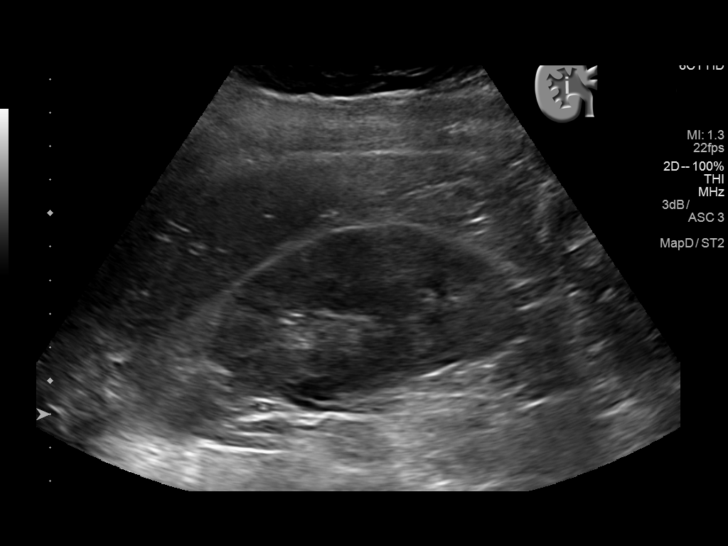
[im 20/76]
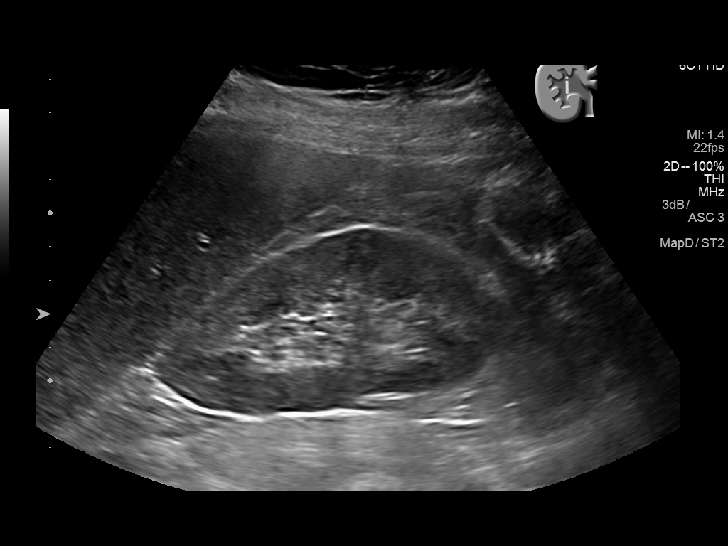
[im 27/76]
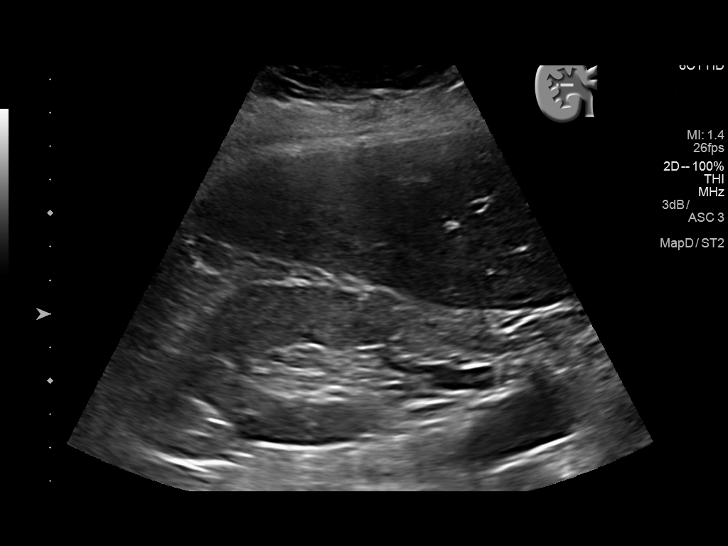
[im 30/76]
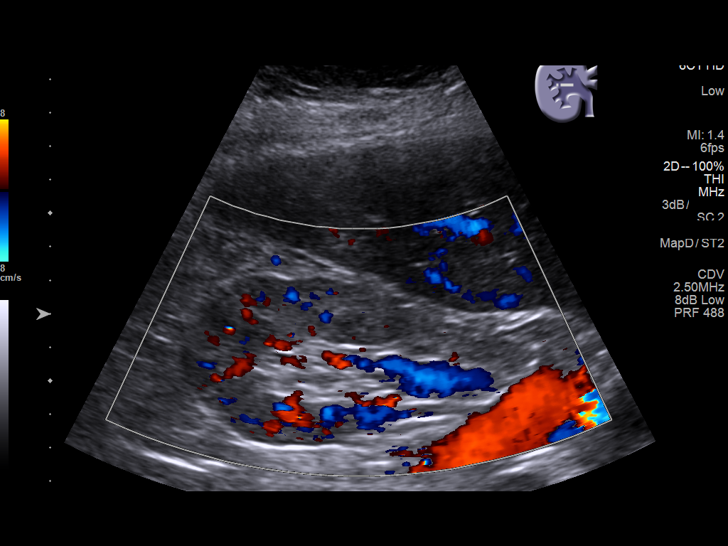
[im 36/76]
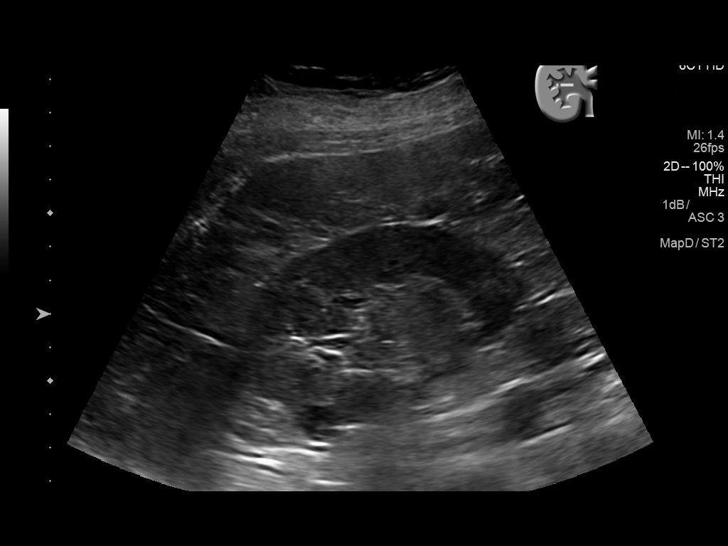
[im 43/76]
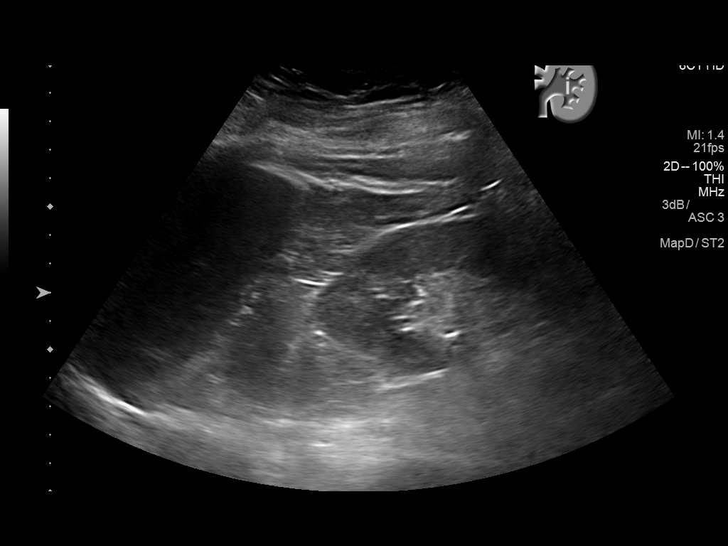
[im 49/76]
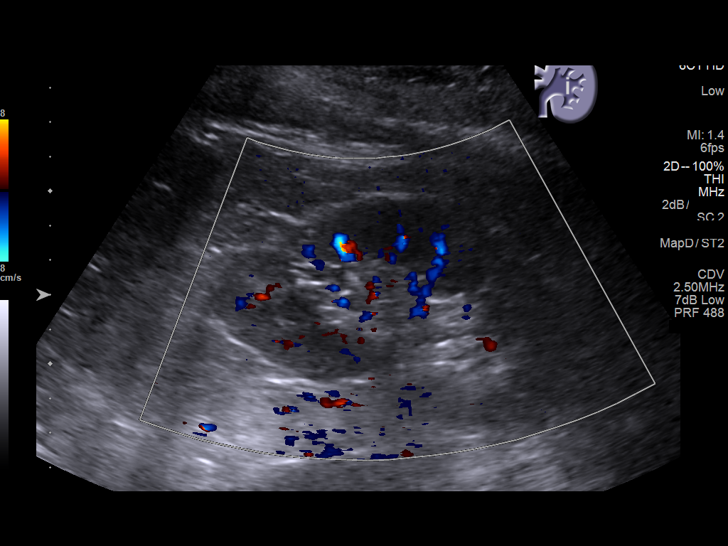
[im 53/76]
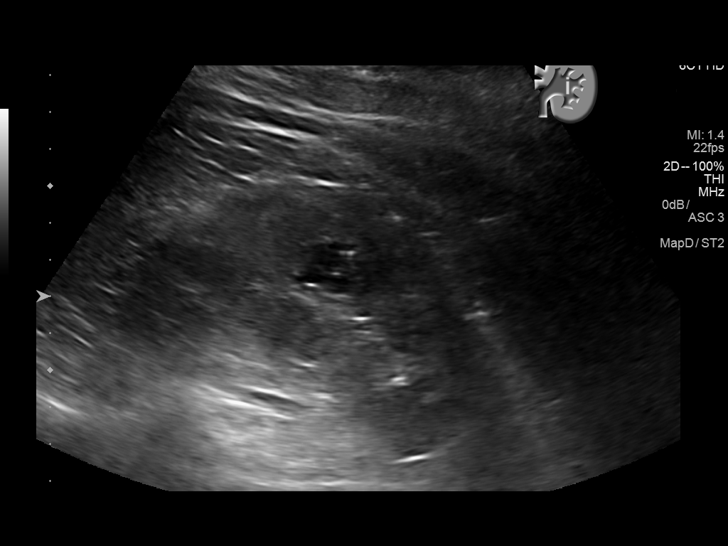
[im 59/76]
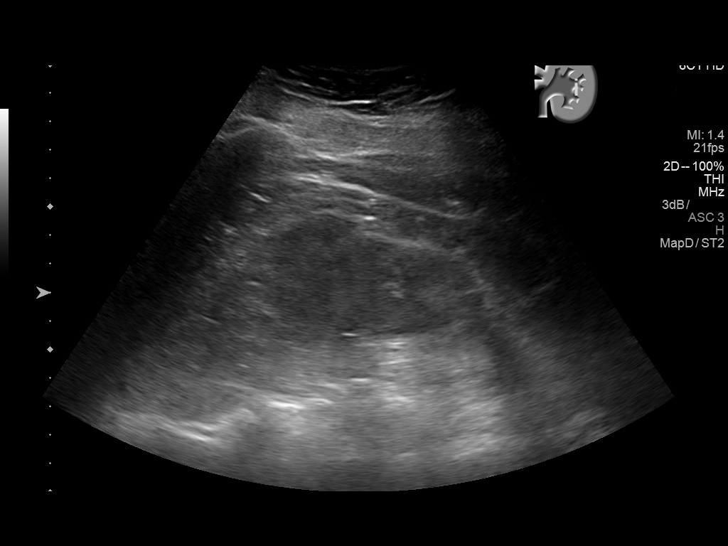
[im 66/76]
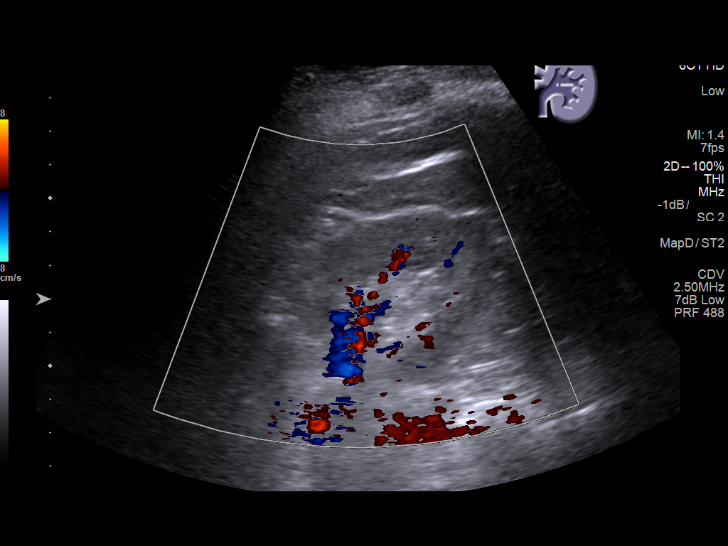
[im 72/76]
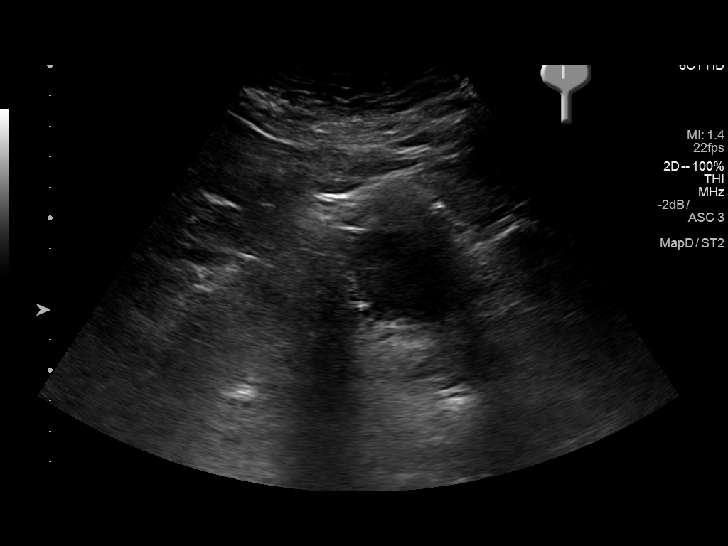

[Series 2: us renal · 0.21mm/px · 1 of 4 slices shown (2 of 2)]
[im 1/4]
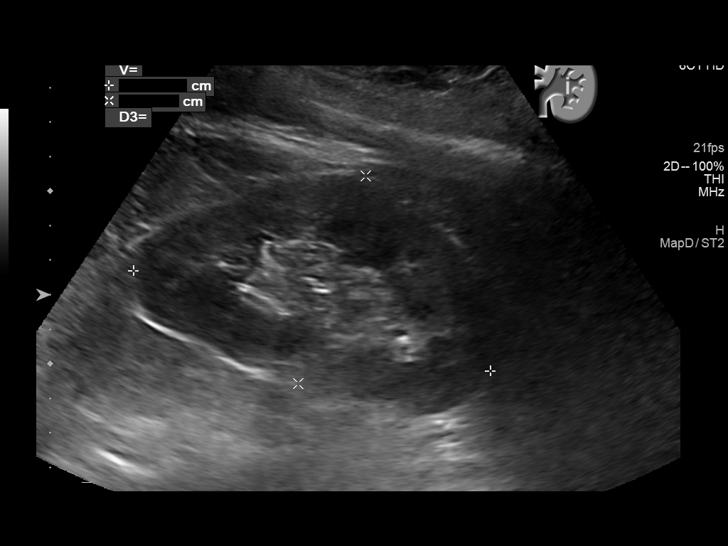

[14 of 25 positions shown; findings below may reference images not displayed]

FINDINGS: Right Kidney:

Renal measurements: 11.7 x 5.3 x 6.5 cm = volume: 212 mL. The renal
parenchyma is mildly echogenic relative to the index organ, the
liver. A small exophytic cyst appears benign, measuring 1.1 x 0.8 x
0.8 cm. No other focal lesions are present.

Left Kidney:

Renal measurements: 10.7 x 6.3 x 5.8 cm = volume: 205 mL. The
parenchyma is isoechoic to the index organ, the spleen. A septated
cyst at the lower pole measures 1.6 x 1.5 x 1.8 cm. No solid
component was present on the recent CT scan. This is a Bosniak 2
cyst.

Bladder:

Appears normal for degree of bladder distention.
IMPRESSION: 1. Echogenic renal parenchyma bilaterally. This is nonspecific, but
consistent with chronic medical renal disease.
2. No obstructive disease or solid mass lesions.
3. Benign cysts of both kidneys. No follow-up necessary for these
lesions.

## 2018-04-18 ENCOUNTER — Other Ambulatory Visit: Payer: Self-pay | Admitting: Internal Medicine

## 2018-06-24 ENCOUNTER — Other Ambulatory Visit (HOSPITAL_COMMUNITY): Payer: Self-pay | Admitting: Nephrology

## 2018-06-24 ENCOUNTER — Other Ambulatory Visit: Payer: Self-pay | Admitting: Nephrology

## 2018-06-24 DIAGNOSIS — R809 Proteinuria, unspecified: Secondary | ICD-10-CM

## 2018-06-30 ENCOUNTER — Other Ambulatory Visit: Payer: Self-pay | Admitting: Family Medicine

## 2018-07-01 ENCOUNTER — Other Ambulatory Visit: Payer: Self-pay

## 2018-07-01 DIAGNOSIS — I10 Essential (primary) hypertension: Secondary | ICD-10-CM

## 2018-07-01 MED ORDER — CARVEDILOL 25 MG PO TABS: 25.0000 mg | ORAL_TABLET | Freq: Two times a day (BID) | ORAL | 1 refills | Status: DC

## 2018-07-07 ENCOUNTER — Other Ambulatory Visit: Payer: Self-pay | Admitting: Radiology

## 2018-07-08 ENCOUNTER — Other Ambulatory Visit (HOSPITAL_COMMUNITY): Payer: Self-pay | Admitting: Nephrology

## 2018-07-08 ENCOUNTER — Encounter (HOSPITAL_COMMUNITY): Payer: Self-pay

## 2018-07-08 ENCOUNTER — Ambulatory Visit (HOSPITAL_COMMUNITY)
Admission: RE | Admit: 2018-07-08 | Discharge: 2018-07-08 | Disposition: A | Discharge status: Home / Self Care | Payer: BC Managed Care – PPO | Source: Ambulatory Visit | Attending: Nephrology | Admitting: Nephrology

## 2018-07-08 DIAGNOSIS — R809 Proteinuria, unspecified: Secondary | ICD-10-CM | POA: Insufficient documentation

## 2018-07-08 LAB — CBC
HCT: 39.8 % (ref 36.0–46.0)
Hemoglobin: 12.3 g/dL (ref 12.0–15.0)
MCH: 25.6 pg — AB (ref 26.0–34.0)
MCHC: 30.9 g/dL (ref 30.0–36.0)
MCV: 82.7 fL (ref 80.0–100.0)
PLATELETS: 360 10*3/uL (ref 150–400)
RBC: 4.81 MIL/uL (ref 3.87–5.11)
RDW: 15.6 % — AB (ref 11.5–15.5)
WBC: 8.4 10*3/uL (ref 4.0–10.5)
nRBC: 0 % (ref 0.0–0.2)

## 2018-07-08 LAB — PROTIME-INR
INR: 1 (ref 0.8–1.2)
Prothrombin Time: 13 seconds (ref 11.4–15.2)

## 2018-07-08 LAB — GLUCOSE, CAPILLARY: Glucose-Capillary: 126 mg/dL — ABNORMAL HIGH (ref 70–99)

## 2018-07-08 IMAGING — US US ABDOMEN LIMITED
1 series · 2 of 2 positions shown · non-contrast
Comparison: Renal ultrasound [DATE]

CLINICAL DATA: 57-year-old with proteinuria and scheduled for renal
biopsy.

EXAM:
ULTRASOUND ABDOMEN LIMITED

[Series 1: us abdomen limited · 2 of 2 slices shown]
[im 1/2]
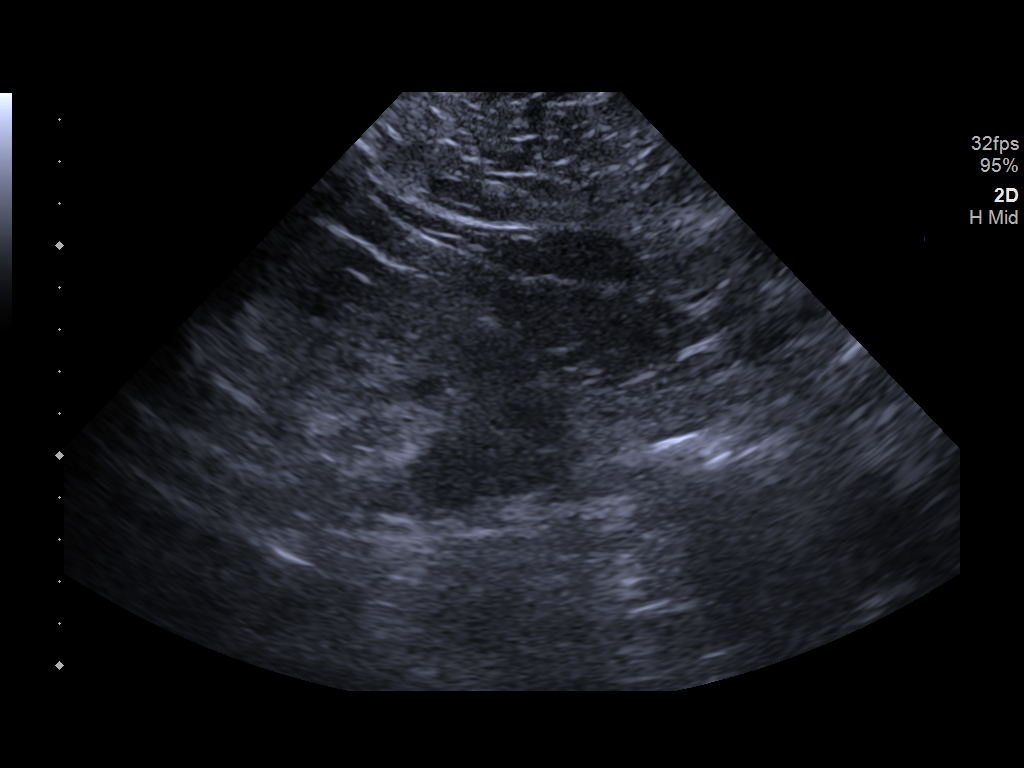
[im 2/2]
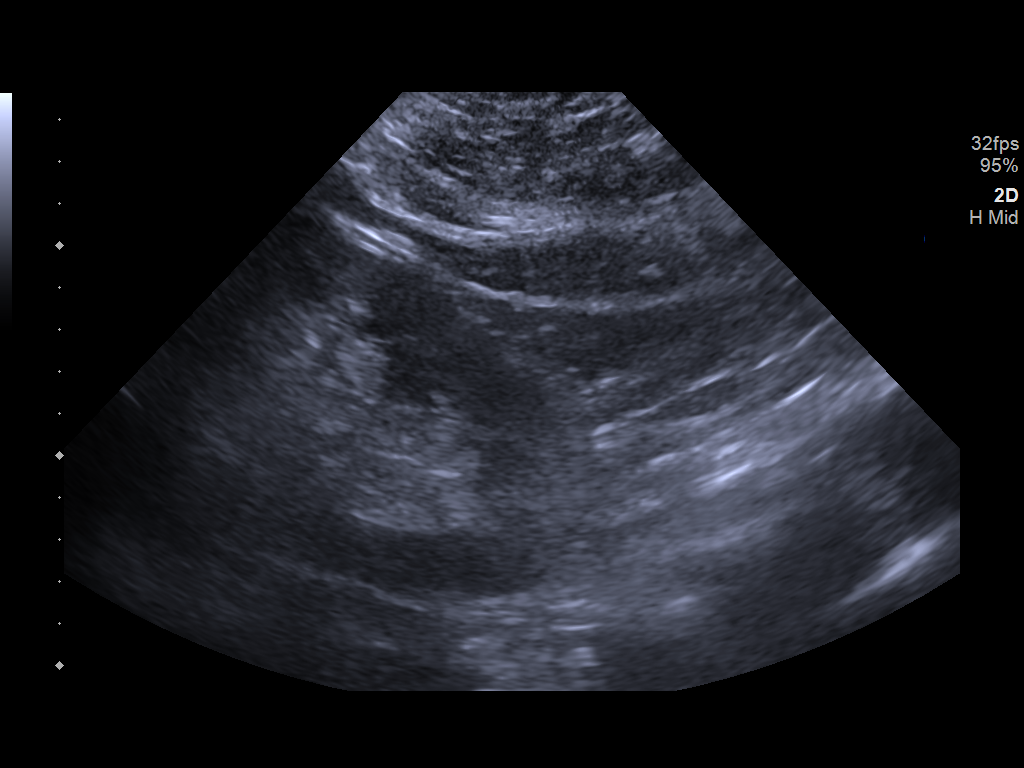

[2 of 2 positions shown; findings below may reference images not displayed]

FINDINGS: Both kidneys were evaluated with ultrasound in anticipation of a
renal biopsy. Both kidneys were visualized without hydronephrosis.
Both kidneys were felt to be suitable for percutaneous biopsy.
However, the patient's blood pressure was markedly elevated with
systolic pressures in the 190s. Due to the elevated blood pressure,
random renal biopsy was not performed.
IMPRESSION: Limited ultrasound images were obtained in anticipation for a renal
biopsy. The renal biopsy was canceled due to hypertension. Dr.
ROMIE was informed of this situation and the patient will be
rescheduled for a random renal biopsy in the future.

Negative for hydronephrosis. Both kidneys were suitable for
ultrasound-guided biopsy.

## 2018-07-08 MED ORDER — LIDOCAINE HCL (PF) 1 % IJ SOLN
INTRAMUSCULAR | Status: AC
  Filled 2018-07-08: qty 30

## 2018-07-08 MED ORDER — GELATIN ABSORBABLE 12-7 MM EX MISC
CUTANEOUS | Status: AC
  Filled 2018-07-08: qty 1

## 2018-07-08 MED ORDER — HYDRALAZINE HCL 20 MG/ML IJ SOLN
INTRAMUSCULAR | Status: DC | PRN
  Administered 2018-07-08: 10 mg via INTRAVENOUS

## 2018-07-08 MED ORDER — HYDRALAZINE HCL 20 MG/ML IJ SOLN
INTRAMUSCULAR | Status: AC
  Filled 2018-07-08: qty 1

## 2018-07-08 MED ORDER — FENTANYL CITRATE (PF) 100 MCG/2ML IJ SOLN
INTRAMUSCULAR | Status: AC
  Filled 2018-07-08: qty 2

## 2018-07-08 MED ORDER — MIDAZOLAM HCL 2 MG/2ML IJ SOLN
INTRAMUSCULAR | Status: AC
  Filled 2018-07-08: qty 2

## 2018-07-08 MED ORDER — SODIUM CHLORIDE 0.9 % IV SOLN: INTRAVENOUS | Status: DC

## 2018-07-08 NOTE — Sedation Documentation (Signed)
Renal biopsy procedure cancelled by Dr. Anselm Pancoast due to elevated blood pressure. Patient states understanding of why procedure was cancelled and she was informed by Dr. Anselm Pancoast to call nephrologist office today for appointment.

## 2018-07-08 NOTE — H&P (Signed)
Chief Complaint: Patient was seen in consultation today for random renal biopsy.  Referring Physician(s): Rosita Fire  Supervising Physician: Markus Daft  Patient Status: [name] - Out-pt  History of Present Illness: [name] is a 58 y.o. female with a past medical history significant for OSA, GERD, DM with gastroparesis, HTN, renal artery aneurysm and CKD III with proteinuria followed by [name] who presents today for a random renal biopsy. [name] reports that she has had problems with her blood pressure being high even with several medications that she takes as prescribed. She had lab work done in April of 2019 which showed a significantly elevated urine microalbumin to creatinine ratio as well as a slight elevation in her creatinine. She was then referred to nephrology for further evaluation. She underwent renal US in November of 2019 which showed echogenic renal parenchyma bilaterally, no obstructive disease or solid mass lesions and benign cysts of both kidneys. She has been referred to IR for a random renal biopsy to further evaluate these findings.  Patient reports she is nervous but denies any other complaints today. She has been feeling very good overall and has been working on weight loss. Her blood pressure at home is typically around 170/80 mmHg with 3 blood pressure medications. She states understanding of procedure and wishes to proceed.  Past Medical History:  Diagnosis Date  . Diabetes mellitus   . GERD (gastroesophageal reflux disease)   . Hyperlipidemia   . Hypertension   . Nausea     Past Surgical History:  Procedure Laterality Date  . BREAST SURGERY     reduction  . CESAREAN SECTION     x2  . CHOLECYSTECTOMY     laparoscopic  . REDUCTION MAMMAPLASTY Bilateral     Allergies: Lisinopril and Jardiance [empagliflozin]  Medications: Prior to Admission medications   Medication Sig Start Date End Date Taking? Authorizing Provider  amLODipine  (NORVASC) 10 MG tablet TAKE 1 TABLET BY MOUTH ONCE DAILY Patient taking differently: Take 10 mg by mouth daily.  01/06/18  Yes Diallo, Abdoulaye, MD  aspirin EC 325 MG tablet Take 325 mg by mouth daily.   Yes [provider]  atorvastatin (LIPITOR) 20 MG tablet TAKE 1 TABLET BY MOUTH ONCE DAILY AT  6PM Patient taking differently: Take 20 mg by mouth daily at 6 PM.  09/16/17  Yes [name], MD  Blood Glucose Monitoring Suppl (ACCU-CHEK AVIVA PLUS) w/Device KIT Use to check sugar three times a day 05/14/17  Yes [name], MD  carvedilol (COREG) 25 MG tablet Take 1 tablet (25 mg total) by mouth 2 (two) times daily with a meal. 07/01/18  Yes Diallo, Abdoulaye, MD  glucose blood (ACCU-CHEK AVIVA PLUS) test strip Use as instructed 05/14/17  Yes [name], MD  hydrochlorothiazide (HYDRODIURIL) 25 MG tablet Take 1 tablet (25 mg total) by mouth daily. 10/15/17  Yes Mercy Riding, MD  metFORMIN (GLUCOPHAGE) 1000 MG tablet Take 1 tablet (1,000 mg total) by mouth 2 (two) times daily with a meal. 06/23/17  Yes [name], MD  pantoprazole (PROTONIX) 40 MG tablet Take 1 tablet (40 mg total) by mouth daily. Patient taking differently: Take 40 mg by mouth daily as needed (acid reflux).  10/26/15  Yes Esterwood, Amy S, PA-C  traMADol (ULTRAM) 50 MG tablet TAKE 1 TABLET BY MOUTH EVERY 8 HOURS AS NEEDED Patient taking differently: Take 50 mg by mouth 2 (two) times daily as needed for moderate pain.  02/17/18  Yes Diallo, Abdoulaye, MD  acetaminophen (TYLENOL) 500 MG tablet Take 1,000 mg by mouth every 6 (six) hours as needed for moderate pain or headache.    [provider]  docusate sodium (COLACE) 100 MG capsule Take 1 capsule (100 mg total) by mouth 2 (two) times daily. Patient not taking: Reported on 07/07/2018 08/06/17   [name], MD  empagliflozin (JARDIANCE) 25 MG TABS tablet Take 25 mg by mouth daily. Patient not taking: Reported on 07/07/2018 08/06/17    [name], MD  hydrochlorothiazide (HYDRODIURIL) 12.5 MG tablet TAKE 1 TABLET BY MOUTH ONCE DAILY Patient not taking: Reported on 07/07/2018 07/01/18   Marjie Skiff, MD  Lancets (ACCU-CHEK SOFT TOUCH) lancets Use to check sugars three times a day 05/14/17   [name], MD  nystatin cream (MYCOSTATIN) Apply 1 application topically 2 (two) times daily. Patient not taking: Reported on 07/07/2018 10/15/17   Mercy Riding, MD  polyethylene glycol (MIRALAX / GLYCOLAX) packet Take 17 g by mouth daily as needed for moderate constipation. 08/06/17   [name], MD     Family History  Problem Relation Age of Onset  . Hypertension Mother   . Diabetes Mother   . Cancer Father        lung  . Colon cancer Neg Hx   . Stomach cancer Neg Hx   . Esophageal cancer Neg Hx     Social History   Socioeconomic History  . Marital status: Legally Separated    Spouse name: Not on file  . Number of children: Not on file  . Years of education: Not on file  . Highest education level: Not on file  Occupational History  . Not on file  Social Needs  . Financial resource strain: Not on file  . Food insecurity:    Worry: Not on file    Inability: Not on file  . Transportation needs:    Medical: Not on file    Non-medical: Not on file  Tobacco Use  . Smoking status: Former Smoker    Years: 10.00    Types: Cigarettes    Last attempt to quit: 10/16/2011    Years since quitting: 6.7  . Smokeless tobacco: Never Used  . Tobacco comment: 1 pack will last a week  Substance and Sexual Activity  . Alcohol use: No    Alcohol/week: 0.0 standard drinks  . Drug use: No  . Sexual activity: Never  Lifestyle  . Physical activity:    Days per week: Not on file    Minutes per session: Not on file  . Stress: Not on file  Relationships  . Social connections:    Talks on phone: Not on file    Gets together: Not on file    Attends religious service: Not on file    Active member of club or  organization: Not on file    Attends meetings of clubs or organizations: Not on file    Relationship status: Not on file  Other Topics Concern  . Not on file  Social History Narrative  . Not on file     Review of Systems: A 12 point ROS discussed and pertinent positives are indicated in the HPI above.  All other systems are negative.  Review of Systems  Constitutional: Negative for appetite change, chills, fatigue, fever and unexpected weight change.  Respiratory: Negative for cough.   Cardiovascular: Negative for chest pain.  Gastrointestinal: Negative for abdominal pain, blood in  stool, diarrhea, nausea and vomiting.  Genitourinary: Negative for dysuria, flank pain and hematuria.  Musculoskeletal: Negative for back pain.  Skin: Negative for color change.  Neurological: Negative for dizziness, syncope and headaches.  Psychiatric/Behavioral: Negative for confusion.    Vital Signs: BP (!) 188/89   Temp 97.9 F (36.6 C) (Oral)   Resp 16   Ht _0  (1.753 m)   Wt 260 lb (117.9 kg)   SpO2 97%   BMI 38.40 kg/m   Physical Exam Vitals signs reviewed.  Constitutional:      General: She is not in acute distress.    Comments: Pleasant, talkative, good historian.  HENT:     Head: Normocephalic.  Cardiovascular:     Rate and Rhythm: Normal rate and regular rhythm.  Pulmonary:     Effort: Pulmonary effort is normal.     Breath sounds: Normal breath sounds.  Abdominal:     General: There is no distension.     Palpations: Abdomen is soft.     Tenderness: There is no abdominal tenderness.  Skin:    General: Skin is warm and dry.  Neurological:     Mental Status: She is alert and oriented to person, place, and time.  Psychiatric:        Mood and Affect: Mood normal.        Behavior: Behavior normal.        Thought Content: Thought content normal.        Judgment: Judgment normal.      MD Evaluation Airway: WNL(top and bottom dentures) Heart: WNL Abdomen: WNL Chest/  Lungs: WNL ASA  Classification: 3 Mallampati/Airway Score: One   Imaging: No results found.  Labs:  CBC: Recent Labs    07/08/18 0650  WBC 8.4  HGB 12.3  HCT 39.8  PLT 360    COAGS: No results for input(s): INR, APTT in the last 8760 hours.  BMP: Recent Labs    08/06/17 1450 10/15/17 1710  NA 140 141  K 3.9 3.9  CL 100 101  CO2 24 23  GLUCOSE 184* 158*  BUN 14 14  CALCIUM 9.8 9.5  CREATININE 1.15* 1.32*  GFRNONAA 53* 45*  GFRAA 61 51*    LIVER FUNCTION TESTS: Recent Labs    08/06/17 1450  BILITOT 0.2  AST 13  ALT 14  ALKPHOS 145*  PROT 7.1  ALBUMIN 3.7    TUMOR MARKERS: No results for input(s): AFPTM, CEA, CA199, CHROMGRNA in the last 8760 hours.  Assessment and Plan:  58 y/o F with poorly controlled HTN, CKD III and proteinuria followed by [name] who presents today for a random renal biopsy.   Afebrile, WBC 8.4, hgb 12.3, plt 360, INR 1.0.  Risks and benefits of renal biopsy was discussed with the patient and/or patient's family including, but not limited to bleeding, infection, damage to adjacent structures or low yield requiring additional tests.  All of the questions were answered and there is agreement to proceed.  Consent signed and in chart.   Thank you for this interesting consult.  I greatly enjoyed meeting JESSICIA NAPOLITANO and look forward to participating in their care.  A copy of this report was sent to the requesting provider on this date.  Electronically Signed: Joaquim Nam, PA-C 07/08/2018, 7:40 AM   I spent a total of  30 Minutes in face to face in clinical consultation, greater than 50% of which was counseling/coordinating care for random renal biopsy.

## 2018-07-08 NOTE — Progress Notes (Signed)
Patient ID: [name], female   DOB: Sep 12, 1960, 58 y.o.   MRN: [id number] Patient's BP remained high despite IV hydralazine.  Discussed with Dr. Carolin Sicks and will re-schedule biopsy after better BP control.

## 2018-07-08 NOTE — Discharge Instructions (Signed)
Percutaneous Kidney Biopsy, Care After °This sheet gives you information about how to care for yourself after your procedure. Your health care provider may also give you more specific instructions. If you have problems or questions, contact your health care provider. °What can I expect after the procedure? °After the procedure, it is common to have: °· Pain or soreness near the area where the needle went through your skin (biopsy site). °· Bright pink or cloudy urine for 24 hours after the procedure. °Follow these instructions at home: °Activity °· Return to your normal activities as told by your health care provider. Ask your health care provider what activities are safe for you. °· Do not drive for 24 hours if you were given a medicine to help you relax (sedative). °· Do not lift anything that is heavier than 10 lb (4.5 kg) until your health care provider tells you that it is safe. °· Avoid activities that take a lot of effort (are strenuous) until your health care provider approves. Most people will have to wait 2 weeks before returning to activities such as exercise or sexual intercourse. °General instructions ° °· Take over-the-counter and prescription medicines only as told by your health care provider. °· You may eat and drink after your procedure. Follow instructions from your health care provider about eating or drinking restrictions. °· Check your biopsy site every day for signs of infection. Check for: °? More redness, swelling, or pain. °? More fluid or blood. °? Warmth. °? Pus or a bad smell. °· Keep all follow-up visits as told by your health care provider. This is important. °Contact a health care provider if: °· You have more redness, swelling, or pain around your biopsy site. °· You have more fluid or blood coming from your biopsy site. °· Your biopsy site feels warm to the touch. °· You have pus or a bad smell coming from your biopsy site. °· You have blood in your urine more than 24 hours after  your procedure. °Get help right away if: °· You have dark red or brown urine. °· You have a fever. °· You are unable to urinate. °· You feel burning when you urinate. °· You feel faint. °· You have severe pain in your abdomen or side. °This information is not intended to replace advice given to you by your health care provider. Make sure you discuss any questions you have with your health care provider. °Document Released: 12/23/2012 Document Revised: 02/02/2016 Document Reviewed: 02/02/2016 °Elsevier Interactive Patient Education © 2019 [employer] °Moderate Conscious Sedation, Adult, Care After °These instructions provide you with information about caring for yourself after your procedure. Your health care provider may also give you more specific instructions. Your treatment has been planned according to current medical practices, but problems sometimes occur. Call your health care provider if you have any problems or questions after your procedure. °What can I expect after the procedure? °After your procedure, it is common: °· To feel sleepy for several hours. °· To feel clumsy and have poor balance for several hours. °· To have poor judgment for several hours. °· To vomit if you eat too soon. °Follow these instructions at home: °For at least 24 hours after the procedure: ° °· Do not: °? Participate in activities where you could fall or become injured. °? Drive. °? Use heavy machinery. °? Drink alcohol. °? Take sleeping pills or medicines that cause drowsiness. °? Make important decisions or sign legal documents. °? Take care of children on   your own. °· Rest. °Eating and drinking °· Follow the diet recommended by your health care provider. °· If you vomit: °? Drink water, juice, or soup when you can drink without vomiting. °? Make sure you have little or no nausea before eating solid foods. °General instructions °· Have a responsible adult stay with you until you are awake and alert. °· Take over-the-counter  and prescription medicines only as told by your health care provider. °· If you smoke, do not smoke without supervision. °· Keep all follow-up visits as told by your health care provider. This is important. °Contact a health care provider if: °· You keep feeling nauseous or you keep vomiting. °· You feel light-headed. °· You develop a rash. °· You have a fever. °Get help right away if: °· You have trouble breathing. °This information is not intended to replace advice given to you by your health care provider. Make sure you discuss any questions you have with your health care provider. °Document Released: 02/10/2013 Document Revised: 09/25/2015 Document Reviewed: 08/12/2015 °Elsevier Interactive Patient Education © 2019 [employer] ° °

## 2018-09-24 ENCOUNTER — Other Ambulatory Visit: Payer: Self-pay | Admitting: *Deleted

## 2018-09-24 MED ORDER — GLUCOSE BLOOD VI STRP: ORAL_STRIP | 12 refills | Status: DC

## 2018-09-24 MED ORDER — ACCU-CHEK SOFT TOUCH LANCETS MISC: 12 refills | Status: AC

## 2018-09-29 ENCOUNTER — Other Ambulatory Visit: Payer: Self-pay | Admitting: Family Medicine

## 2018-09-29 ENCOUNTER — Other Ambulatory Visit: Payer: Self-pay | Admitting: *Deleted

## 2018-09-29 MED ORDER — GLUCOSE BLOOD VI STRP: ORAL_STRIP | 12 refills | Status: AC

## 2018-09-29 MED ORDER — ACCU-CHEK SOFTCLIX LANCETS MISC: 12 refills | Status: AC

## 2018-09-29 NOTE — Telephone Encounter (Signed)
Rx request for softclix lancets. Deseree Kennon Holter, CMA

## 2018-11-12 ENCOUNTER — Other Ambulatory Visit: Payer: Self-pay

## 2018-11-12 ENCOUNTER — Ambulatory Visit (INDEPENDENT_AMBULATORY_CARE_PROVIDER_SITE_OTHER): Payer: BC Managed Care – PPO | Admitting: Family Medicine

## 2018-11-12 ENCOUNTER — Encounter: Payer: Self-pay | Admitting: Family Medicine

## 2018-11-12 VITALS — BP 182/92 | HR 78

## 2018-11-12 DIAGNOSIS — E119 Type 2 diabetes mellitus without complications: Secondary | ICD-10-CM | POA: Diagnosis not present

## 2018-11-12 DIAGNOSIS — Z23 Encounter for immunization: Secondary | ICD-10-CM | POA: Diagnosis not present

## 2018-11-12 DIAGNOSIS — I10 Essential (primary) hypertension: Secondary | ICD-10-CM

## 2018-11-12 LAB — POCT GLYCOSYLATED HEMOGLOBIN (HGB A1C): HbA1c, POC (controlled diabetic range): 6.8 % (ref 0.0–7.0)

## 2018-11-12 LAB — POCT UA - MICROALBUMIN
Albumin/Creatinine Ratio, Urine, POC: >300
Creatinine, POC: 100 mg/dL
Microalbumin Ur, POC: 150 mg/L

## 2018-11-12 NOTE — Assessment & Plan Note (Addendum)
-  Continue diet and exercise, good job decreasing her A1c -Follow-up A1c in 3 months -Follow-up BMP, CBC, lipid panel

## 2018-11-12 NOTE — Progress Notes (Signed)
Subjective:  [name] is a 58 y.o. female who presents to the [facility] today with a chief complaint of diabetes follow up CKD follow up.   HPI: Diabetes She is not currently taking any diabetic medication.  She has made drastic changes in her diet and exercise.  She reports that while she used to drink at least a sixpack of soda daily, she now drinks maybe 2 cans per month.  She reports she is also exercising regularly trying to prioritize her health.  She is not currently endorsing polyuria or dysuria.  A1c 6.8 this morning.  Protein/creatinine ratio remains elevated over 300.  HTN He has been following up with nephrology.  She reports that she has had very elevated blood pressures above 200 during her recent visits with nephrology.  She was recently started on new medications and had old medications discontinued.  The chart has been updated to reflect these changes.  Significantly, she was started on clonidine in the past week.  She reports that she did not take her medications today which may be contributing to her current blood pressure of 182/92.  She reports that she rarely misses doses of medication (maybe twice per week).  CKD She has been following with [facility] for work-up of her CKD, proteinuria and hypertension.  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  She reports that they discussed a kidney biopsy at her last appointment.  Health maintenance Age-appropriate cancer screening is complete.  She was encouraged to get the pneumococcal vaccine today.  She reports that she does not like getting injections.  She was told that a single injection would likely be easier than a hospitalization for pneumonia.  Following this statement, she previously noted the recent passing of her mother due to coronavirus.  Her mother  passed away in a nursing home as result of coronavirus infection.   Chief Complaint noted Review of Symptoms - see HPI PMH -non-smoker, nephrotic syndrome.    Objective:  Physical Exam: BP (!) 182/92   Pulse 78   SpO2 99%    Gen: NAD, resting comfortably CV: RRR with no murmurs appreciated Pulm: NWOB, CTAB with no crackles, wheezes, or rhonchi GI: Normal bowel sounds present. Soft, Nontender, Nondistended. MSK: no edema, cyanosis, or clubbing noted Skin: warm, dry, 2+ LE edema(chronic)  Diabetic Foot Exam - Simple   Simple Foot Form Visual Inspection No deformities, no ulcerations, no other skin breakdown bilaterally: Yes Sensation Testing Intact to touch and monofilament testing bilaterally: Yes Pulse Check Posterior Tibialis and Dorsalis pulse intact bilaterally: Yes Comments      Results for orders placed or performed in visit on 11/12/18 (from the past 72 hour(s))  HgB A1c     Status: None   Collection Time: 11/12/18  8:50 AM  Result Value Ref Range   Hemoglobin A1C     HbA1c POC (<> result, manual entry)     HbA1c, POC (prediabetic range)     HbA1c, POC (controlled diabetic range) 6.8 0.0 - 7.0 %  POCT UA - Microalbumin     Status: Abnormal   Collection Time: 11/12/18  8:50 AM  Result Value Ref Range   Microalbumin Ur, POC 150 mg/L   Creatinine, POC 100 mg/dL   Albumin/Creatinine Ratio, Urine, POC >300      Assessment/Plan:  Type 2 diabetes mellitus  without complication, without long-term current use of insulin (HCC) -Continue diet and exercise, good job decreasing her A1c -Follow-up A1c in 3 months -Follow-up BMP, CBC, lipid panel  HYPERTENSION, BENIGN SYSTEMIC -Continue to follow with nephrology regarding appropriate work-up and treatment of hypertension -DC'd HCTZ in chart -Make sure to take all of her blood pressure medications every day -Continue clonidine -Continue spironolactone -Continue carvedilol -Continue amlodipine -We will call  [facility] for most recent lab work/note (last visit about 1 week ago) -Follow-up in 1 month following next appointment with [facility]  Need for vaccination against Streptococcus pneumoniae She was informed of her increased risk for pneumonia and the benefit she would receive from a pneumococcal vaccine.  She declined the vaccine today and says she would like to think about it. -Continue to offer pneumococcal vaccine

## 2018-11-12 NOTE — Patient Instructions (Signed)
It was great to see you today.  Here's a quick review:  Diabetes: great job! Keep up the good work with diet and exercise.    Blood pressure:  It looks like you pretty high today but you haven't taken you medication.  Continue to see you kidney doctor and make sure to take your medication everyday.  Vaccine: I'm going to keep encouraging you to get the pneumonia vaccine.  Please consider getting it.  Cancer screening: You are up-to-date on you cancer screening.  Come back in one month after you have seen you kidney doctor.

## 2018-11-12 NOTE — Assessment & Plan Note (Addendum)
-  Continue to follow with nephrology regarding appropriate work-up and treatment of hypertension -DC'd HCTZ in chart -Make sure to take all of her blood pressure medications every day -Continue clonidine -Continue spironolactone -Continue carvedilol -Continue amlodipine -We will call [facility] for most recent lab work/note (last visit about 1 week ago) -Follow-up in 1 month following next appointment with [facility]

## 2018-11-12 NOTE — Assessment & Plan Note (Signed)
She was informed of her increased risk for pneumonia and the benefit she would receive from a pneumococcal vaccine.  She declined the vaccine today and says she would like to think about it. -Continue to offer pneumococcal vaccine

## 2018-11-13 ENCOUNTER — Telehealth: Payer: Self-pay | Admitting: Family Medicine

## 2018-11-13 LAB — CBC
Hematocrit: 34.6 % (ref 34.0–46.6)
Hemoglobin: 11.4 g/dL (ref 11.1–15.9)
MCH: 25.7 pg — ABNORMAL LOW (ref 26.6–33.0)
MCHC: 32.9 g/dL (ref 31.5–35.7)
MCV: 78 fL — ABNORMAL LOW (ref 79–97)
Platelets: 404 10*3/uL (ref 150–450)
RBC: 4.43 x10E6/uL (ref 3.77–5.28)
RDW: 15 % (ref 11.7–15.4)
WBC: 9.8 10*3/uL (ref 3.4–10.8)

## 2018-11-13 LAB — BASIC METABOLIC PANEL
BUN/Creatinine Ratio: 10 (ref 9–23)
BUN: 34 mg/dL — ABNORMAL HIGH (ref 6–24)
CO2: 21 mmol/L (ref 20–29)
Calcium: 9.5 mg/dL (ref 8.7–10.2)
Chloride: 107 mmol/L — ABNORMAL HIGH (ref 96–106)
Creatinine, Ser: 3.35 mg/dL — ABNORMAL HIGH (ref 0.57–1.00)
GFR calc Af Amer: 17 mL/min/{1.73_m2} — ABNORMAL LOW (ref >59)
GFR calc non Af Amer: 14 mL/min/{1.73_m2} — ABNORMAL LOW (ref >59)
Glucose: 113 mg/dL — ABNORMAL HIGH (ref 65–99)
Potassium: 5 mmol/L (ref 3.5–5.2)
Sodium: 145 mmol/L — ABNORMAL HIGH (ref 134–144)

## 2018-11-13 LAB — LIPID PANEL
Chol/HDL Ratio: 3.3 ratio (ref 0.0–4.4)
Cholesterol, Total: 181 mg/dL (ref 100–199)
HDL: 55 mg/dL (ref >39)
LDL Calculated: 107 mg/dL — ABNORMAL HIGH (ref 0–99)
Triglycerides: 95 mg/dL (ref 0–149)
VLDL Cholesterol Cal: 19 mg/dL (ref 5–40)

## 2018-11-13 NOTE — Telephone Encounter (Signed)
Omar was called and the clinical assistant of Dr. Carolin Sicks was notified of the creatinine recently noted in clinic (3.3, increased from 1.3 about 1 year ago).  The assistant verified that this was similar to the creatinine of 3.4 noted from their labs drawn on 7/2.  The assistant was informed that because Ms. Risinger was generally well-appearing had no complaints consistent with acute kidney failure, she would not be sent to the ED at this time.  She will be following up with [facility] on 7/20.  [facility] was also notified that Ms. Wickes will be told to stop her spironolactone due to her worsening kidney function.  Ms. Trower was called shortly after the above-mentioned phone call.  She was informed that her kidney function has been worsening and that she should stop her spironolactone.  She was not experiencing symptoms at the time of the phone call.  She was told to follow-up with her kidney doctors on 7/20.  She was told to present to the ED if she began to experience nausea, vomiting, confusion, grogginess, muscle spasms, palpitations, shortness of breath.  Ms. Gintz phone died during our conversation related to reasons to present to the ED.  She was called again and a voicemail was left with the above-mentioned red flag symptoms that would require medical attention in the emergency department.  Matilde Haymaker, MD

## 2018-11-25 ENCOUNTER — Other Ambulatory Visit: Payer: Self-pay | Admitting: Nephrology

## 2018-11-25 DIAGNOSIS — N183 Chronic kidney disease, stage 3 unspecified: Secondary | ICD-10-CM

## 2018-12-02 ENCOUNTER — Ambulatory Visit
Admission: RE | Admit: 2018-12-02 | Discharge: 2018-12-02 | Disposition: A | Discharge status: Home / Self Care | Payer: BC Managed Care – PPO | Source: Ambulatory Visit | Attending: Nephrology | Admitting: Nephrology

## 2018-12-02 ENCOUNTER — Other Ambulatory Visit: Payer: Self-pay

## 2018-12-02 DIAGNOSIS — N183 Chronic kidney disease, stage 3 unspecified: Secondary | ICD-10-CM

## 2018-12-02 IMAGING — US US RENAL
1 series · 14 of 25 positions shown · non-contrast
Comparison: Renal ultrasound [DATE]

CLINICAL DATA: Stage III chronic renal disease

EXAM:
RENAL / URINARY TRACT ULTRASOUND COMPLETE

[Series 1: us renal · 0.25mm/px · 14 of 41 slices shown]
[im 1/41]
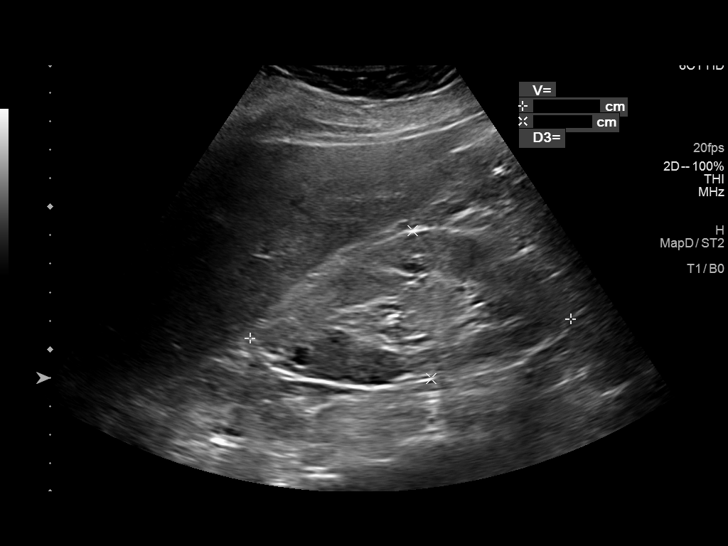
[im 4/41]
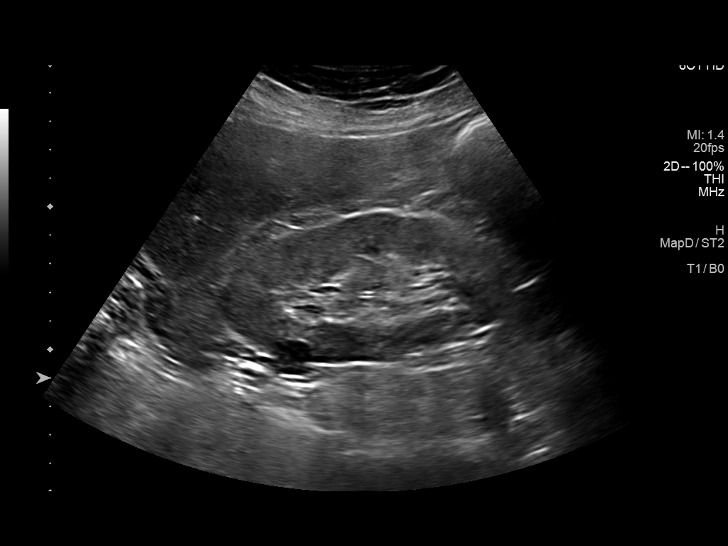
[im 7/41]
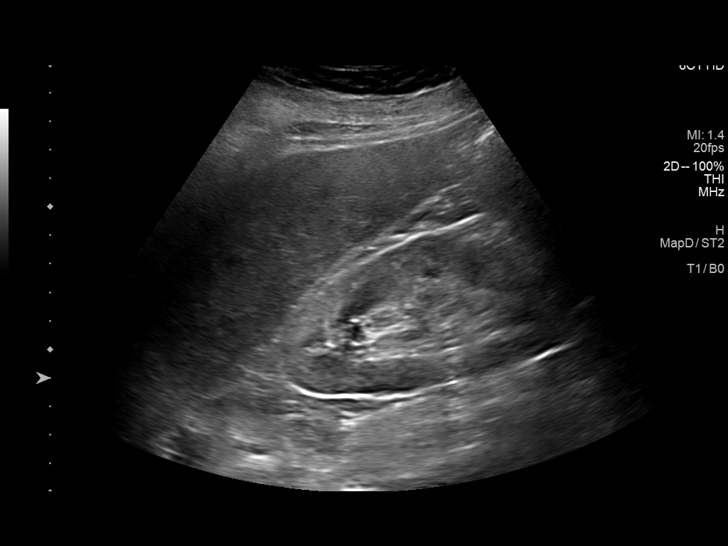
[im 11/41]
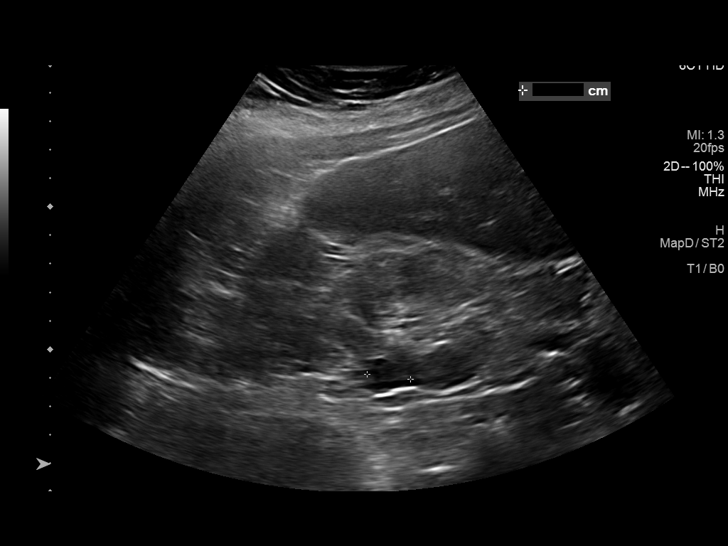
[im 14/41]
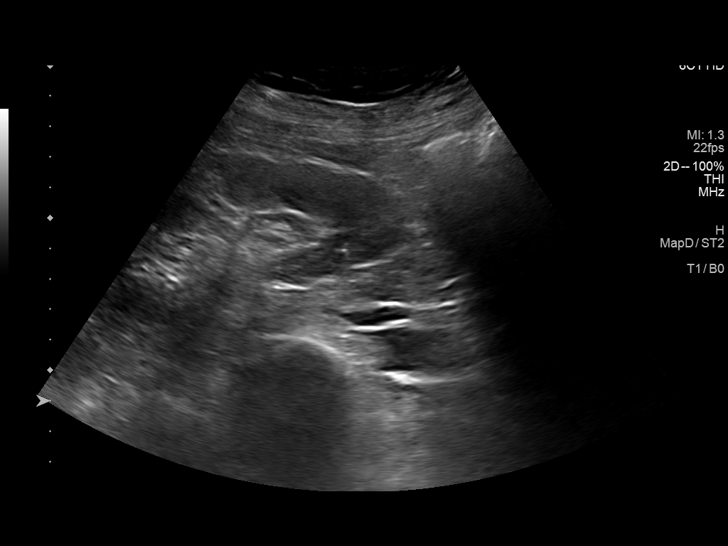
[im 16/41]
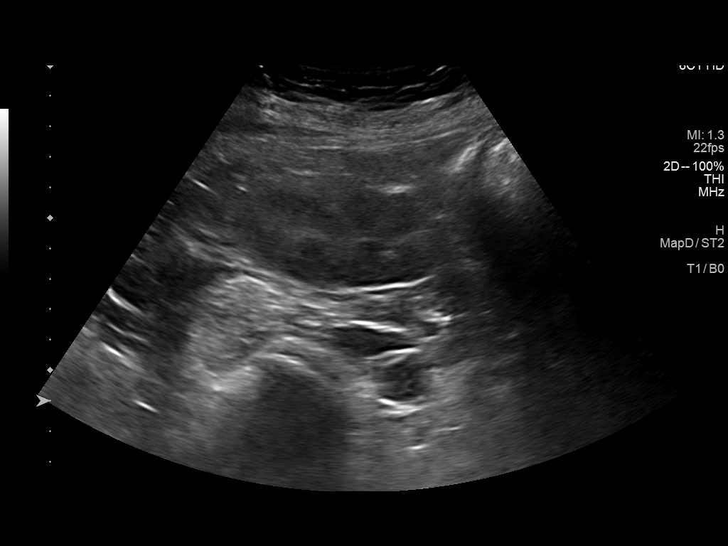
[im 19/41]
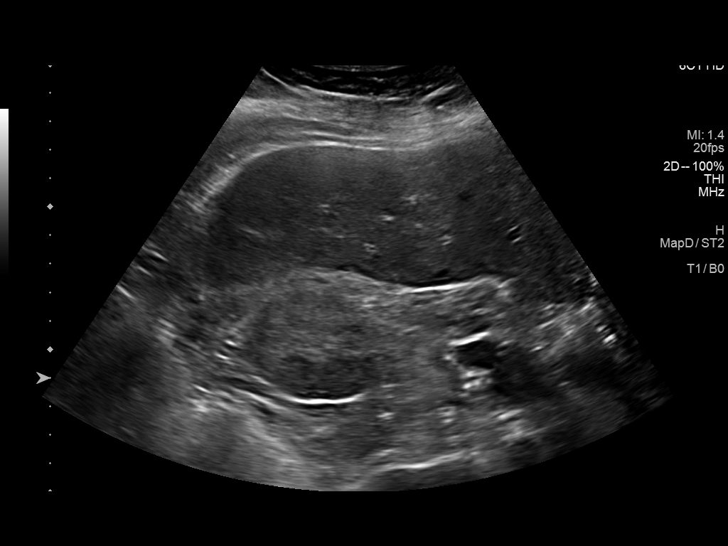
[im 22/41]
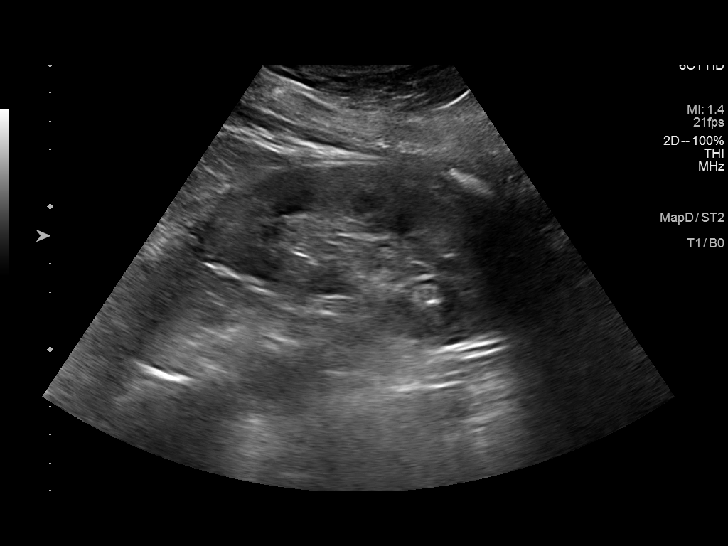
[im 26/41]
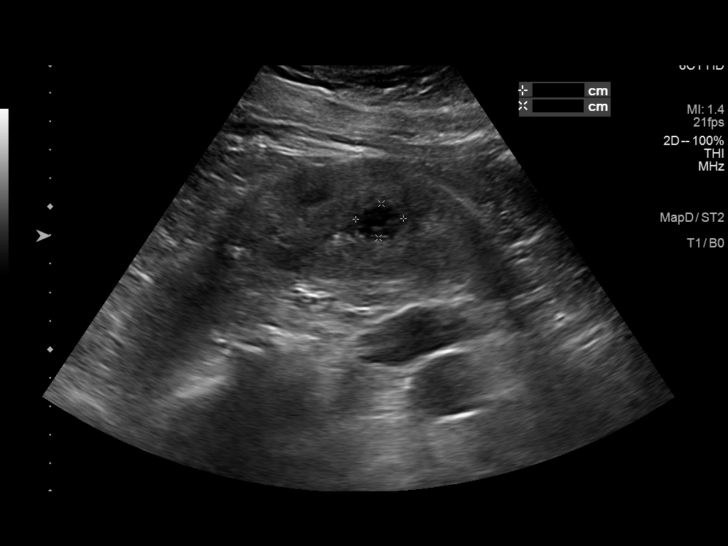
[im 27/41]
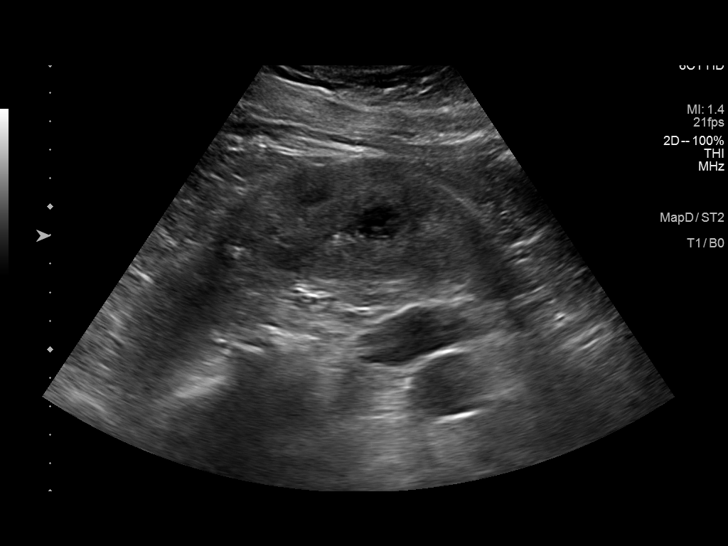
[im 31/41]
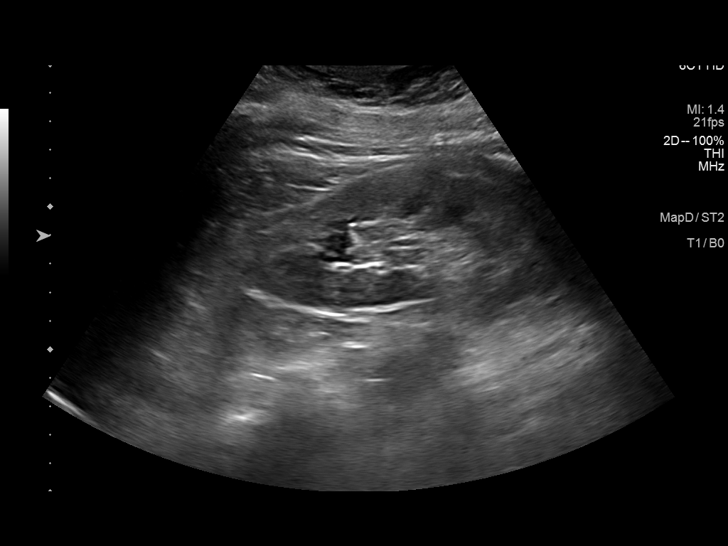
[im 34/41]
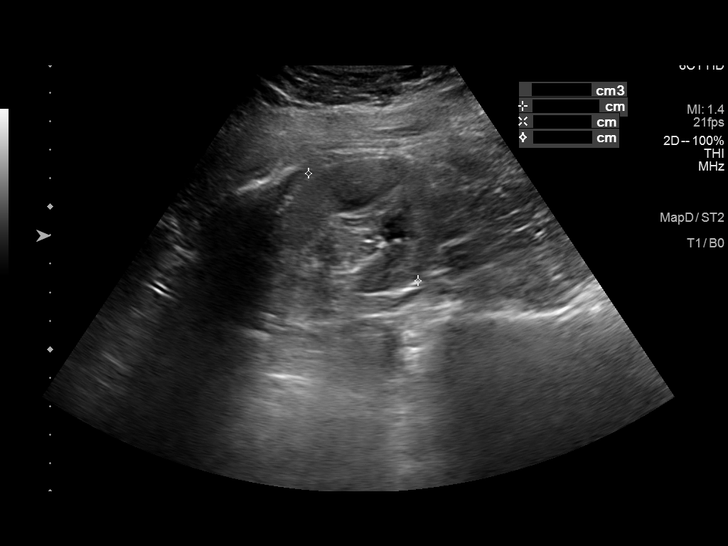
[im 37/41]
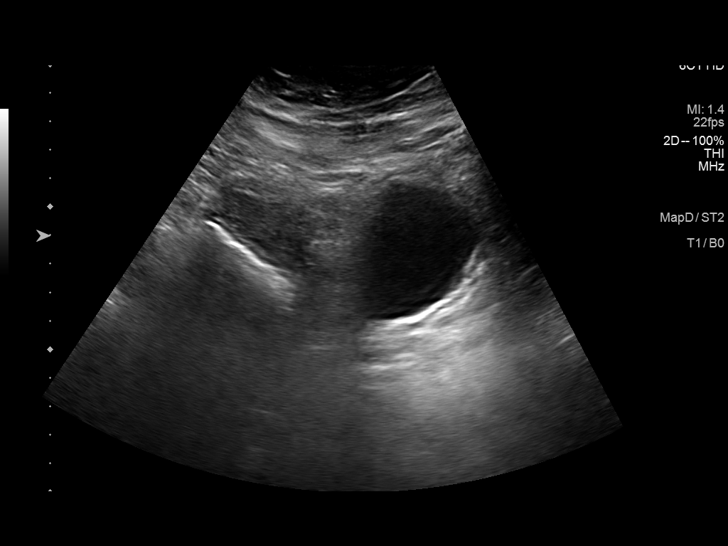
[im 41/41]
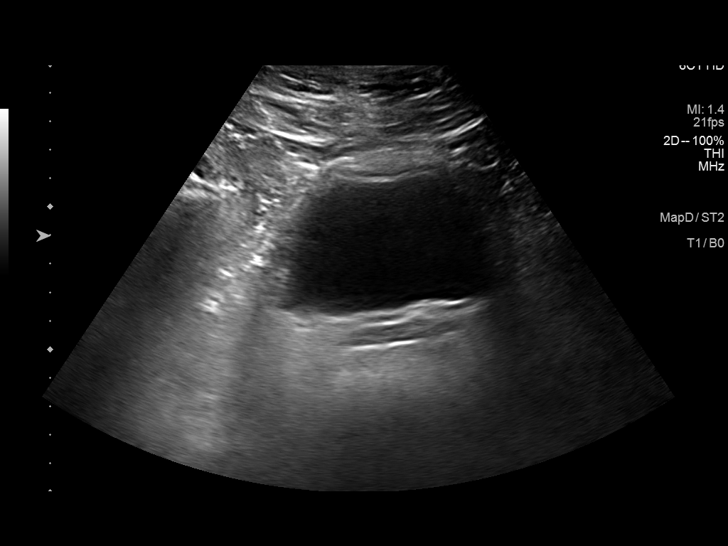

[14 of 25 positions shown; findings below may reference images not displayed]

FINDINGS: Right Kidney:

Renal measurements: 11.3 x 5.2 x 5.5 cm = volume: 169.6 mL .
Echogenicity is mildly increased. Renal cortical thickness normal.
No perinephric fluid or hydronephrosis visualized. There is a cyst
arising from the medial upper right kidney measuring 1.2 x 1.0 x
cm. No sonographically demonstrable calculus or ureterectasis.

Left Kidney:

Renal measurements: 11.8 x 5.6 x 5.4 cm = volume: 185.3 mL.
Echogenicity is mildly increased. Renal cortical thickness within
normal limits. No perinephric fluid or hydronephrosis visualized.
Septated cyst arising from the mid left kidney measures 1.7 x 1.2 x
1.7 cm, essentially stable. No sonographically demonstrable calculus
or ureterectasis.

Bladder:

Appears normal for degree of bladder distention.
IMPRESSION: 1. Kidneys mildly echogenic, a finding indicative of medical renal
disease. Renal cortical thickness normal bilaterally. No obstructing
focus in either kidney.

2. Cystic areas in each kidney with mildly complex cyst in the left
kidney, stable in appearance.

## 2018-12-10 LAB — HM DIABETES EYE EXAM

## 2018-12-16 ENCOUNTER — Encounter (INDEPENDENT_AMBULATORY_CARE_PROVIDER_SITE_OTHER): Payer: BC Managed Care – PPO | Admitting: Ophthalmology

## 2018-12-16 ENCOUNTER — Other Ambulatory Visit: Payer: Self-pay

## 2018-12-16 DIAGNOSIS — E11319 Type 2 diabetes mellitus with unspecified diabetic retinopathy without macular edema: Secondary | ICD-10-CM

## 2018-12-16 DIAGNOSIS — H35033 Hypertensive retinopathy, bilateral: Secondary | ICD-10-CM

## 2018-12-16 DIAGNOSIS — I10 Essential (primary) hypertension: Secondary | ICD-10-CM | POA: Diagnosis not present

## 2018-12-16 DIAGNOSIS — H43813 Vitreous degeneration, bilateral: Secondary | ICD-10-CM

## 2018-12-16 DIAGNOSIS — E113393 Type 2 diabetes mellitus with moderate nonproliferative diabetic retinopathy without macular edema, bilateral: Secondary | ICD-10-CM

## 2019-01-20 ENCOUNTER — Other Ambulatory Visit (HOSPITAL_COMMUNITY): Payer: Self-pay | Admitting: Nephrology

## 2019-01-20 DIAGNOSIS — N184 Chronic kidney disease, stage 4 (severe): Secondary | ICD-10-CM

## 2019-02-02 ENCOUNTER — Other Ambulatory Visit: Payer: Self-pay | Admitting: Radiology

## 2019-02-03 ENCOUNTER — Ambulatory Visit (HOSPITAL_COMMUNITY)
Admission: RE | Admit: 2019-02-03 | Discharge: 2019-02-03 | Disposition: A | Discharge status: Home / Self Care | Payer: BC Managed Care – PPO | Source: Ambulatory Visit | Attending: Nephrology | Admitting: Nephrology

## 2019-02-03 ENCOUNTER — Other Ambulatory Visit: Payer: Self-pay

## 2019-02-03 DIAGNOSIS — E1122 Type 2 diabetes mellitus with diabetic chronic kidney disease: Secondary | ICD-10-CM | POA: Diagnosis not present

## 2019-02-03 DIAGNOSIS — E785 Hyperlipidemia, unspecified: Secondary | ICD-10-CM | POA: Diagnosis not present

## 2019-02-03 DIAGNOSIS — Z79899 Other long term (current) drug therapy: Secondary | ICD-10-CM | POA: Insufficient documentation

## 2019-02-03 DIAGNOSIS — Z801 Family history of malignant neoplasm of trachea, bronchus and lung: Secondary | ICD-10-CM | POA: Insufficient documentation

## 2019-02-03 DIAGNOSIS — Z833 Family history of diabetes mellitus: Secondary | ICD-10-CM | POA: Diagnosis not present

## 2019-02-03 DIAGNOSIS — I129 Hypertensive chronic kidney disease with stage 1 through stage 4 chronic kidney disease, or unspecified chronic kidney disease: Secondary | ICD-10-CM | POA: Diagnosis not present

## 2019-02-03 DIAGNOSIS — Z888 Allergy status to other drugs, medicaments and biological substances status: Secondary | ICD-10-CM | POA: Diagnosis not present

## 2019-02-03 DIAGNOSIS — N184 Chronic kidney disease, stage 4 (severe): Secondary | ICD-10-CM

## 2019-02-03 DIAGNOSIS — Z7982 Long term (current) use of aspirin: Secondary | ICD-10-CM | POA: Diagnosis not present

## 2019-02-03 DIAGNOSIS — Z9049 Acquired absence of other specified parts of digestive tract: Secondary | ICD-10-CM | POA: Diagnosis not present

## 2019-02-03 DIAGNOSIS — Z8249 Family history of ischemic heart disease and other diseases of the circulatory system: Secondary | ICD-10-CM | POA: Diagnosis not present

## 2019-02-03 DIAGNOSIS — Z87891 Personal history of nicotine dependence: Secondary | ICD-10-CM | POA: Diagnosis not present

## 2019-02-03 DIAGNOSIS — N189 Chronic kidney disease, unspecified: Secondary | ICD-10-CM | POA: Insufficient documentation

## 2019-02-03 LAB — CBC
HCT: 30.2 % — ABNORMAL LOW (ref 36.0–46.0)
Hemoglobin: 9.7 g/dL — ABNORMAL LOW (ref 12.0–15.0)
MCH: 27.1 pg (ref 26.0–34.0)
MCHC: 32.1 g/dL (ref 30.0–36.0)
MCV: 84.4 fL (ref 80.0–100.0)
Platelets: 287 10*3/uL (ref 150–400)
RBC: 3.58 MIL/uL — ABNORMAL LOW (ref 3.87–5.11)
RDW: 15.9 % — ABNORMAL HIGH (ref 11.5–15.5)
WBC: 9.1 10*3/uL (ref 4.0–10.5)
nRBC: 0 % (ref 0.0–0.2)

## 2019-02-03 LAB — PROTIME-INR
INR: 1.1 (ref 0.8–1.2)
Prothrombin Time: 13.6 seconds (ref 11.4–15.2)

## 2019-02-03 LAB — GLUCOSE, CAPILLARY: Glucose-Capillary: 131 mg/dL — ABNORMAL HIGH (ref 70–99)

## 2019-02-03 IMAGING — US US BIOPSY
1 series · 12 of 12 positions shown · non-contrast
Comparison: none

INDICATION: 58-year-old female with a history of proteinuria and renal disease

[Series 1: us biopsy · 12 of 12 slices shown]
[im 1/12]
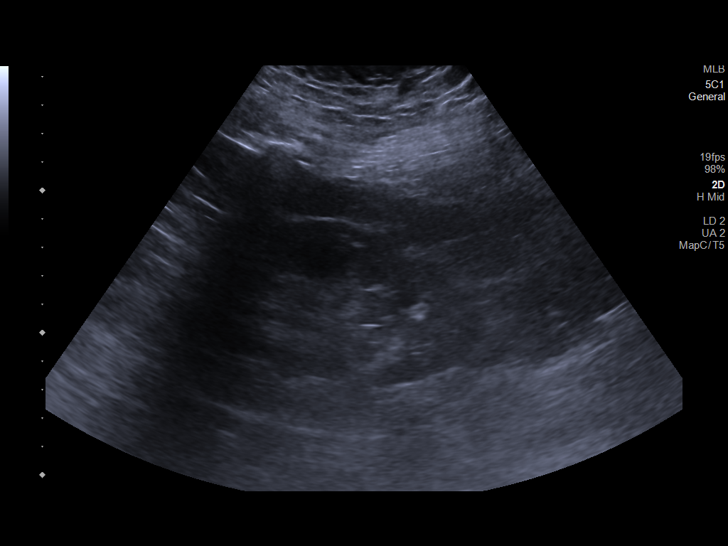
[im 2/12]
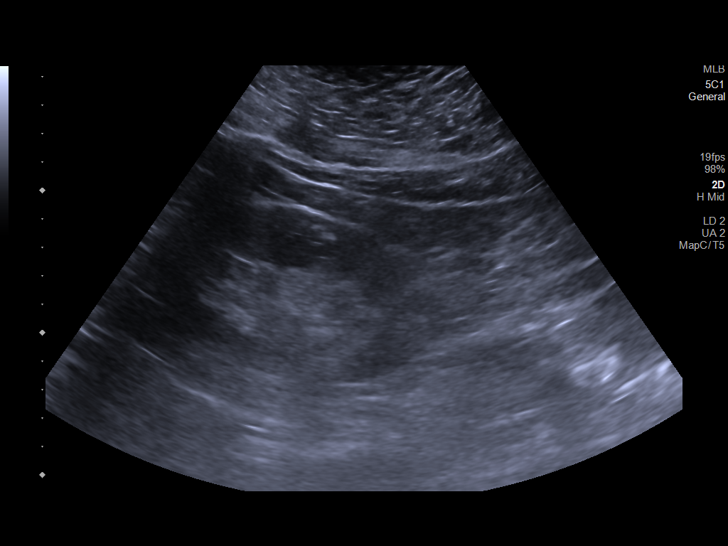
[im 3/12]
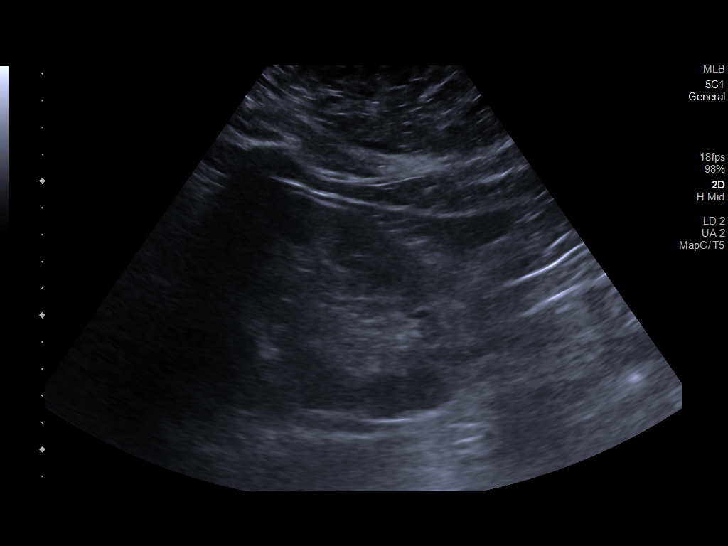
[im 4/12]
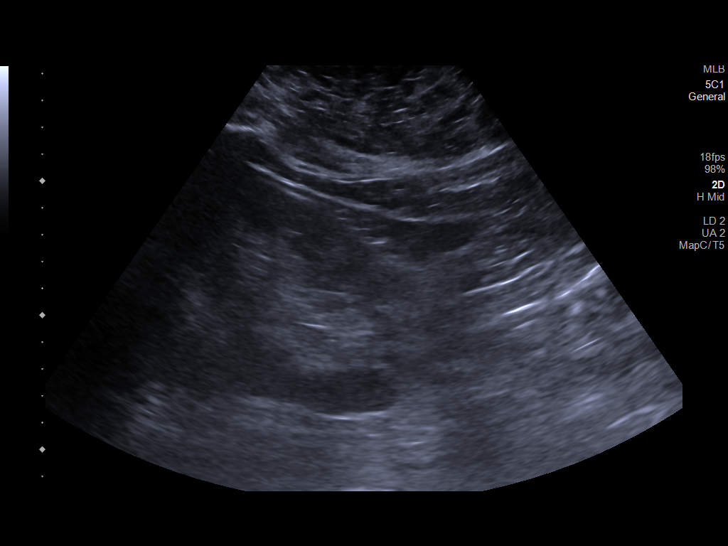
[im 5/12]
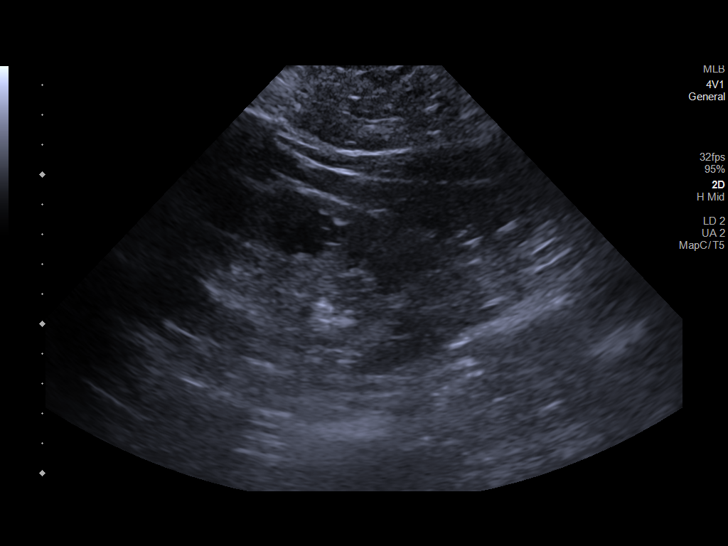
[im 6/12]
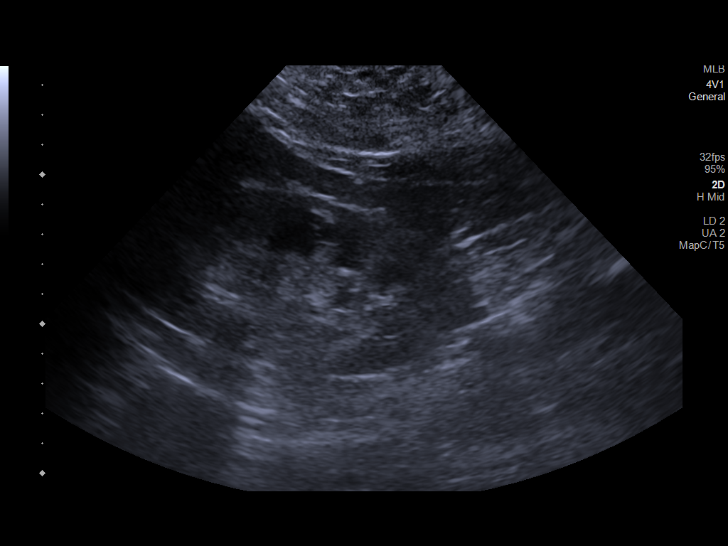
[im 7/12]
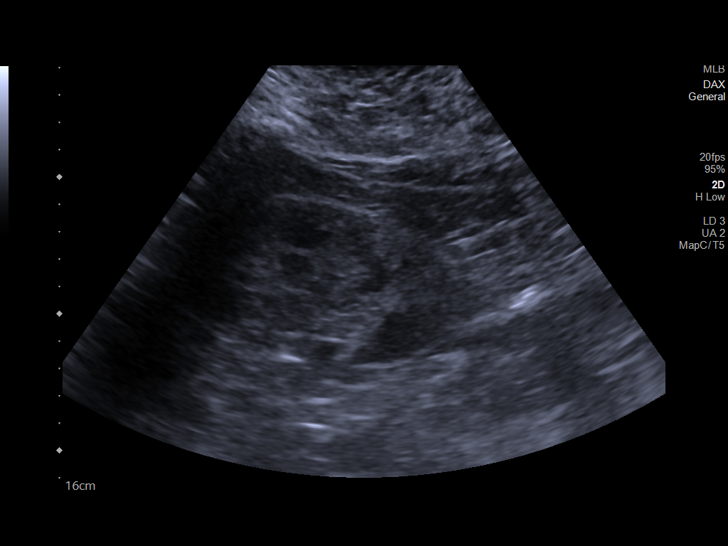
[im 8/12]
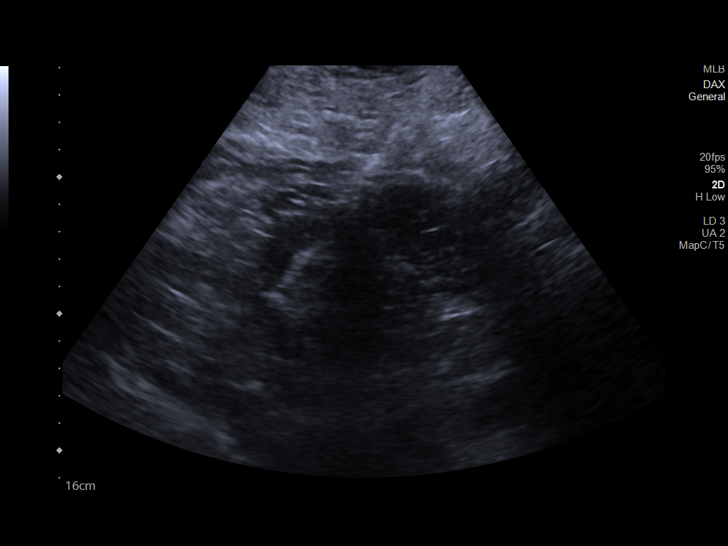
[im 9/12]
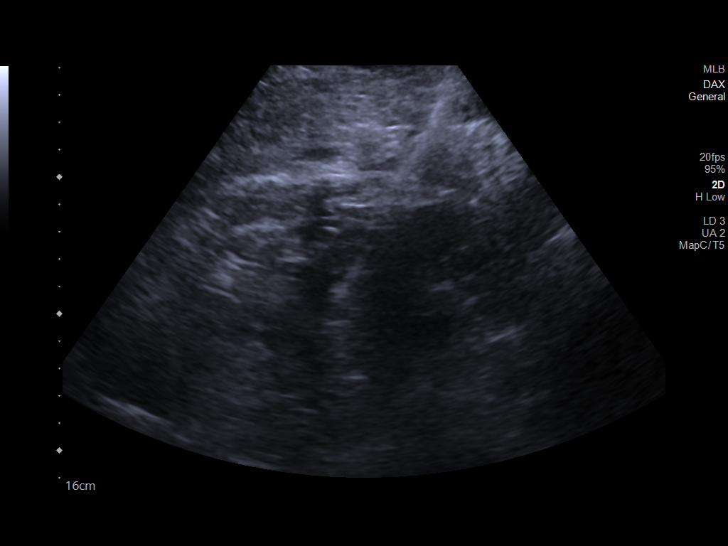
[im 10/12]
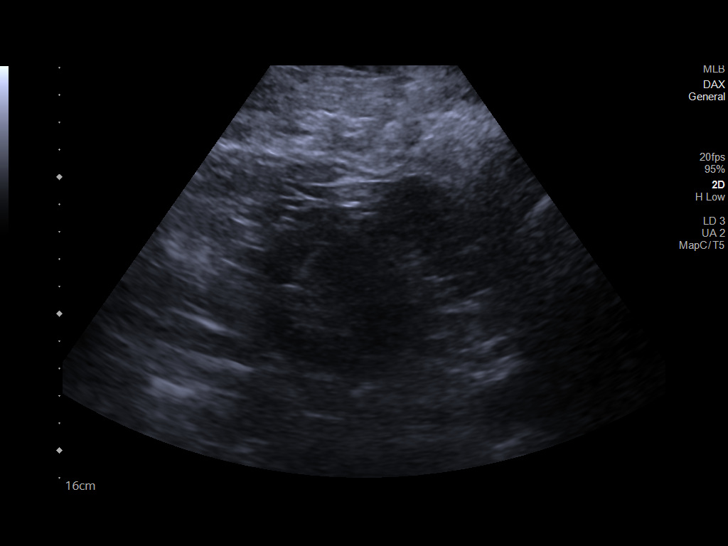
[im 11/12]
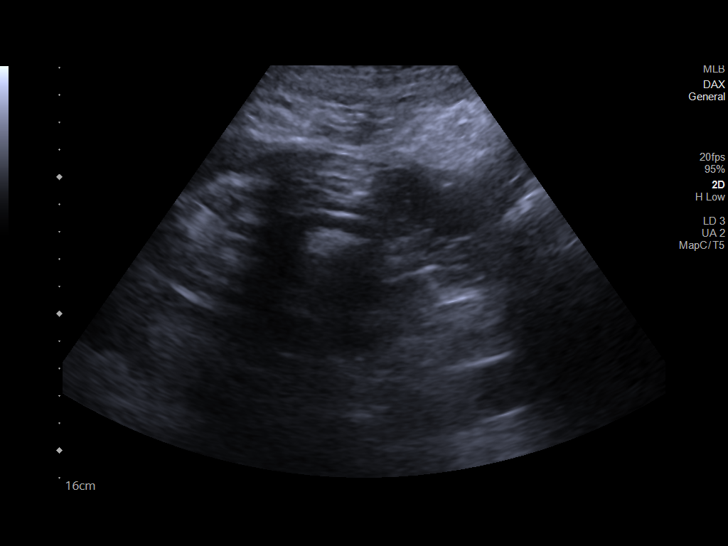
[im 12/12]
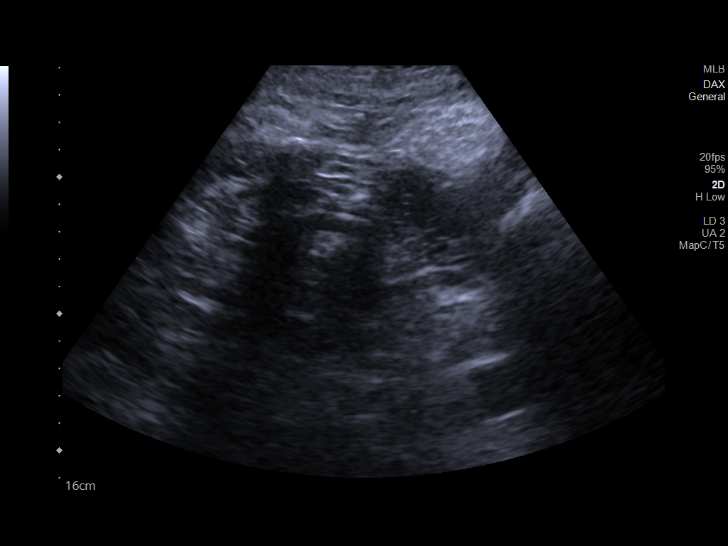

[12 of 12 positions shown; findings below may reference images not displayed]

EXAM:
IMAGE GUIDED medical renal biopsy

MEDICATIONS:
None.

ANESTHESIA/SEDATION:
Moderate (conscious) sedation was employed during this procedure. A
total of Versed 1.5 mg and Fentanyl 75 mcg was administered
intravenously.

Moderate Sedation Time: 13 minutes. The patient's level of
consciousness and vital signs were monitored continuously by
radiology nursing throughout the procedure under my direct
supervision.

FLUOROSCOPY TIME:  None).

COMPLICATIONS:
None

PROCEDURE:
Informed written consent was obtained from the patient after a
thorough discussion of the procedural risks, benefits and
alternatives. All questions were addressed. Maximal Sterile Barrier
Technique was utilized including caps, mask, sterile gowns, sterile
gloves, sterile drape, hand hygiene and skin antiseptic. A timeout
was performed prior to the initiation of the procedure.

Patient was positioned prone position on the gantry table. Images
were stored sent to PACs.

Once the patient is prepped and draped in the usual sterile fashion,
the skin and subcutaneous tissues overlying the left kidney were
generously infiltrated 1% lidocaine for local anesthesia.

Using ultrasound guidance, a 15 gauge guide needle was advanced into
the lower cortex of the left kidney.

Once we confirmed location of the needle tip, 3 separate 16 gauge
core biopsy were achieved.

Two Gel-Foam pledgets were infused with a small amount of saline.
The needle was removed.

Final images were stored.

The patient tolerated the procedure well and remained
hemodynamically stable throughout.

No complications were encountered and no significant blood loss
encountered.
IMPRESSION: Status post image guided biopsy of left kidney for medical renal
purpose

## 2019-02-03 MED ORDER — MIDAZOLAM HCL 2 MG/2ML IJ SOLN
INTRAMUSCULAR | Status: AC | PRN
  Administered 2019-02-03: 0.5 mg via INTRAVENOUS
  Administered 2019-02-03: 1 mg via INTRAVENOUS

## 2019-02-03 MED ORDER — GELATIN ABSORBABLE 12-7 MM EX MISC
CUTANEOUS | Status: AC
  Filled 2019-02-03: qty 1

## 2019-02-03 MED ORDER — FENTANYL CITRATE (PF) 100 MCG/2ML IJ SOLN
INTRAMUSCULAR | Status: AC | PRN
  Administered 2019-02-03: 50 ug via INTRAVENOUS
  Administered 2019-02-03: 25 ug via INTRAVENOUS

## 2019-02-03 MED ORDER — HYDRALAZINE HCL 20 MG/ML IJ SOLN
INTRAMUSCULAR | Status: AC
  Filled 2019-02-03: qty 1

## 2019-02-03 MED ORDER — HYDRALAZINE HCL 20 MG/ML IJ SOLN
INTRAMUSCULAR | Status: AC | PRN
  Administered 2019-02-03: 10 mg via INTRAVENOUS

## 2019-02-03 MED ORDER — SODIUM CHLORIDE 0.9 % IV SOLN: INTRAVENOUS | Status: DC

## 2019-02-03 MED ORDER — MIDAZOLAM HCL 2 MG/2ML IJ SOLN
INTRAMUSCULAR | Status: AC
  Filled 2019-02-03: qty 2

## 2019-02-03 MED ORDER — FENTANYL CITRATE (PF) 100 MCG/2ML IJ SOLN
INTRAMUSCULAR | Status: AC
  Filled 2019-02-03: qty 2

## 2019-02-03 MED ORDER — LIDOCAINE HCL (PF) 1 % IJ SOLN
INTRAMUSCULAR | Status: AC
  Filled 2019-02-03: qty 30

## 2019-02-03 NOTE — Discharge Instructions (Signed)
Moderate Conscious Sedation, Adult, Care After °These instructions provide you with information about caring for yourself after your procedure. Your health care provider may also give you more specific instructions. Your treatment has been planned according to current medical practices, but problems sometimes occur. Call your health care provider if you have any problems or questions after your procedure. °What can I expect after the procedure? °After your procedure, it is common: °· To feel sleepy for several hours. °· To feel clumsy and have poor balance for several hours. °· To have poor judgment for several hours. °· To vomit if you eat too soon. °Follow these instructions at home: °For at least 24 hours after the procedure: ° °· Do not: °? Participate in activities where you could fall or become injured. °? Drive. °? Use heavy machinery. °? Drink alcohol. °? Take sleeping pills or medicines that cause drowsiness. °? Make important decisions or sign legal documents. °? Take care of children on your own. °· Rest. °Eating and drinking °· Follow the diet recommended by your health care provider. °· If you vomit: °? Drink water, juice, or soup when you can drink without vomiting. °? Make sure you have little or no nausea before eating solid foods. °General instructions °· Have a responsible adult stay with you until you are awake and alert. °· Take over-the-counter and prescription medicines only as told by your health care provider. °· If you smoke, do not smoke without supervision. °· Keep all follow-up visits as told by your health care provider. This is important. °Contact a health care provider if: °· You keep feeling nauseous or you keep vomiting. °· You feel light-headed. °· You develop a rash. °· You have a fever. °Get help right away if: °· You have trouble breathing. °This information is not intended to replace advice given to you by your health care provider. Make sure you discuss any questions you have  with your health care provider. °Document Released: 02/10/2013 Document Revised: 04/04/2017 Document Reviewed: 08/12/2015 °Elsevier Patient Education © 2020 [employer] °Needle Biopsy, Care After °This sheet gives you information about how to care for yourself after your procedure. Your health care provider may also give you more specific instructions. If you have problems or questions, contact your health care provider. °What can I expect after the procedure? °After the procedure, it is common to have soreness, bruising, or mild pain at the puncture site. This should go away in a few days. °Follow these instructions at home: °Needle insertion site care ° °· Wash your hands with soap and water before you change your bandage (dressing). If you cannot use soap and water, use hand sanitizer. °· Follow instructions from your health care provider about how to take care of your puncture site. This includes: °? When and how to change your dressing. °? When to remove your dressing. °· Check your puncture site every day for signs of infection. Check for: °? Redness, swelling, or pain. °? Fluid or blood. °? Pus or a bad smell. °? Warmth. °General instructions °· Return to your normal activities as told by your health care provider. Ask your health care provider what activities are safe for you. °· Do not take baths, swim, or use a hot tub until your health care provider approves. Ask your health care provider if you may take showers. You may only be allowed to take sponge baths. °· Take over-the-counter and prescription medicines only as told by your health care provider. °· Keep all follow-up   visits as told by your health care provider. This is important. °Contact a health care provider if: °· You have a fever. °· You have redness, swelling, or pain at the puncture site that lasts longer than a few days. °· You have fluid, blood, or pus coming from your puncture site. °· Your puncture site feels warm to the touch. °Get  help right away if: °· You have severe bleeding from the puncture site. °Summary °· After the procedure, it is common to have soreness, bruising, or mild pain at the puncture site. This should go away in a few days. °· Check your puncture site every day for signs of infection, such as redness, swelling, or pain. °· Get help right away if you have severe bleeding from your puncture site. °This information is not intended to replace advice given to you by your health care provider. Make sure you discuss any questions you have with your health care provider. °Document Released: 09/06/2014 Document Revised: 07/04/2017 Document Reviewed: 05/05/2017 °Elsevier Patient Education © 2020 [employer] ° °

## 2019-02-03 NOTE — Progress Notes (Signed)
Ambulated in room tol well  

## 2019-02-03 NOTE — H&P (Signed)
Chief Complaint: Patient was seen in consultation today for random renal biopsy.  Referring Physician(s): Rosita Fire  Supervising Physician: Corrie Mckusick  Patient Status: [facility] - Out-pt  History of Present Illness: [name] is a 58 y.o. female with a past medical history significant for GERD, HLD. HTN, DM and CKD followed by Dr. Carolin Sicks who presents today for a random renal biopsy. She has been followed by nephrology since 2019 and has had poorly controlled HTN and DM since that time. Her kidney function had continued to decline slowly however her creatinine on 08/18/18 was 1.64 which rapidly worsened to 2.42 on 10/22/18, 3.4 on 11/05/18 and 3.15 on 11/23/18. She was seen by their office on 01/12/19 where she complained of continued lower extremity edema and SBP ~150s. A request was made to IR for a random renal biopsy to further evaluate worsening kidney function.    [name] denies any complaints today, she reports her blood pressure at home is "pretty high" before she takes her blood pressure medication but is "much better" after she takes her medication. She states she checks her blood pressure at home but cannot remember what the numbers are today. She states understanding of the procedure and wishes to proceed.   Past Medical History:  Diagnosis Date  . Diabetes mellitus   . GERD (gastroesophageal reflux disease)   . Hyperlipidemia   . Hypertension   . Nausea     Past Surgical History:  Procedure Laterality Date  . BREAST SURGERY     reduction  . CESAREAN SECTION     x2  . CHOLECYSTECTOMY     laparoscopic  . REDUCTION MAMMAPLASTY Bilateral     Allergies: Lisinopril and Jardiance [empagliflozin]  Medications: Prior to Admission medications   Medication Sig Start Date End Date Taking? Authorizing Provider  acetaminophen (TYLENOL) 500 MG tablet Take 1,000 mg by mouth every 6 (six) hours as needed for moderate pain or headache.   Yes [provider]   amLODipine (NORVASC) 10 MG tablet TAKE 1 TABLET BY MOUTH ONCE DAILY Patient taking differently: Take 10 mg by mouth daily.  01/06/18  Yes Diallo, Abdoulaye, MD  aspirin EC 325 MG tablet Take 325 mg by mouth daily.   Yes [provider]  atorvastatin (LIPITOR) 20 MG tablet TAKE 1 TABLET BY MOUTH ONCE DAILY AT  6PM Patient taking differently: Take 20 mg by mouth daily at 6 PM.  09/16/17  Yes [name], MD  carvedilol (COREG) 25 MG tablet Take 1 tablet (25 mg total) by mouth 2 (two) times daily with a meal. 07/01/18  Yes Diallo, Abdoulaye, MD  cloNIDine (CATAPRES) 0.2 MG tablet Take 0.2 mg by mouth 3 (three) times daily.   Yes [provider]  ferrous sulfate 325 (65 FE) MG tablet Take 325 mg by mouth daily with breakfast.   Yes [provider]  furosemide (LASIX) 40 MG tablet Take 40 mg by mouth 2 (two) times daily.   Yes [provider]  potassium chloride SA (K-DUR) 20 MEQ tablet Take 20 mEq by mouth daily.   Yes [provider]  Vitamin D, Ergocalciferol, (DRISDOL) 1.25 MG (50000 UT) CAPS capsule Take 50,000 Units by mouth every 7 (seven) days. For 5 weeks   Yes [provider]  Accu-Chek Softclix Lancets lancets Use as instructed 09/29/18   Diallo, Earna Coder, MD  Blood Glucose Monitoring Suppl (ACCU-CHEK AVIVA PLUS) w/Device KIT Use to check sugar three times a day 05/14/17   Dallas Schimke,  Almond Lint, MD  glucose blood (ACCU-CHEK AVIVA PLUS) test strip Use as instructed 09/29/18   Diallo, Earna Coder, MD  Lancets (ACCU-CHEK SOFT TOUCH) lancets Use to check sugars three times a day 09/24/18   Diallo, Earna Coder, MD  polyethylene glycol (MIRALAX / GLYCOLAX) packet Take 17 g by mouth daily as needed for moderate constipation. 08/06/17   [name], MD  traMADol (ULTRAM) 50 MG tablet TAKE 1 TABLET BY MOUTH EVERY 8 HOURS AS NEEDED Patient taking differently: Take 50 mg by mouth 2 (two) times daily as needed for moderate pain.  02/17/18   Marjie Skiff, MD     Family History  Problem Relation Age of Onset  . Hypertension Mother   . Diabetes Mother   . Cancer Father        lung  . Colon cancer Neg Hx   . Stomach cancer Neg Hx   . Esophageal cancer Neg Hx     Social History   Socioeconomic History  . Marital status: Legally Separated    Spouse name: Not on file  . Number of children: Not on file  . Years of education: Not on file  . Highest education level: Not on file  Occupational History  . Not on file  Social Needs  . Financial resource strain: Not on file  . Food insecurity    Worry: Not on file    Inability: Not on file  . Transportation needs    Medical: Not on file    Non-medical: Not on file  Tobacco Use  . Smoking status: Former Smoker    Years: 10.00    Types: Cigarettes    Quit date: 10/16/2011    Years since quitting: 7.3  . Smokeless tobacco: Never Used  . Tobacco comment: 1 pack will last a week  Substance and Sexual Activity  . Alcohol use: No    Alcohol/week: 0.0 standard drinks  . Drug use: No  . Sexual activity: Never  Lifestyle  . Physical activity    Days per week: Not on file    Minutes per session: Not on file  . Stress: Not on file  Relationships  . Social Herbalist on phone: Not on file    Gets together: Not on file    Attends religious service: Not on file    Active member of club or organization: Not on file    Attends meetings of clubs or organizations: Not on file    Relationship status: Not on file  Other Topics Concern  . Not on file  Social History Narrative  . Not on file     Review of Systems: A 12 point ROS discussed and pertinent positives are indicated in the HPI above.  All other systems are negative.  Review of Systems  Constitutional: Negative for chills and fever.  HENT: Negative for nosebleeds.   Respiratory: Negative for cough and shortness of breath.   Cardiovascular: Negative for chest pain.  Gastrointestinal: Negative for  abdominal pain, blood in stool, diarrhea, nausea and vomiting.  Genitourinary: Negative for dysuria and hematuria.  Musculoskeletal: Negative for back pain.  Skin: Negative for rash and wound.  Neurological: Negative for dizziness and headaches.    Vital Signs: BP (!) 164/80   Pulse 73   Temp 97.8 F (36.6 C) (Skin)   Resp 16   Ht '5\' 9"'$  (1.753 m)   Wt 279 lb (126.6 kg)   SpO2 100%   BMI 41.20 kg/m  Physical Exam Vitals signs reviewed.  Constitutional:      General: She is not in acute distress. HENT:     Head: Normocephalic.     Mouth/Throat:     Mouth: Mucous membranes are moist.     Pharynx: Oropharynx is clear. No oropharyngeal exudate or posterior oropharyngeal erythema.     Comments: (+) full upper denture Cardiovascular:     Rate and Rhythm: Normal rate and regular rhythm.  Pulmonary:     Effort: Pulmonary effort is normal.     Breath sounds: Normal breath sounds.  Abdominal:     General: Bowel sounds are normal. There is no distension.     Palpations: Abdomen is soft.     Tenderness: There is no abdominal tenderness.  Skin:    General: Skin is warm and dry.  Neurological:     Mental Status: She is alert and oriented to person, place, and time.  Psychiatric:        Mood and Affect: Mood normal.        Behavior: Behavior normal.        Thought Content: Thought content normal.        Judgment: Judgment normal.      MD Evaluation Airway: WNL Heart: WNL Abdomen: WNL Chest/ Lungs: WNL ASA  Classification: 3 Mallampati/Airway Score: Two    Imaging: No results found.  Labs:  CBC: Recent Labs    07/08/18 0650 11/12/18 0949 02/03/19 0703  WBC 8.4 9.8 9.1  HGB 12.3 11.4 9.7*  HCT 39.8 34.6 30.2*  PLT 360 404 287    COAGS: Recent Labs    07/08/18 0650 02/03/19 0703  INR 1.0 1.1    BMP: Recent Labs    11/12/18 0949  NA 145*  K 5.0  CL 107*  CO2 21  GLUCOSE 113*  BUN 34*  CALCIUM 9.5  CREATININE 3.35*  GFRNONAA 14*  GFRAA 17*     LIVER FUNCTION TESTS: No results for input(s): BILITOT, AST, ALT, ALKPHOS, PROT, ALBUMIN in the last 8760 hours.  TUMOR MARKERS: No results for input(s): AFPTM, CEA, CA199, CHROMGRNA in the last 8760 hours.  Assessment and Plan:  58 y/o F with history of CKD, poorly controlled HTN and DM followed by nephrology with rapidly worsening kidney function (creatinine 1.64 08/2018 --> 3.15 11/2018) who presents for a random renal biopsy today for further evaluation.  Patient has been NPO since 8 pm yesterday, she does not take blood thinning medications. Afebrile, WBC 9.1, hgb 9.7, plt 287, INR 1.1.  Risks and benefits of random renal biospy was discussed with the patient and/or patient's family including, but not limited to bleeding, infection, damage to adjacent structures or low yield requiring additional tests.  All of the questions were answered and there is agreement to proceed.  Consent signed and in chart.   Thank you for this interesting consult.  I greatly enjoyed meeting [name] and look forward to participating in their care.  A copy of this report was sent to the requesting provider on this date.  Electronically Signed: Joaquim Nam, PA-C 02/03/2019, 7:39 AM   I spent a total of  15 Minutes  in face to face in clinical consultation, greater than 50% of which was counseling/coordinating care for random renal biopsy.

## 2019-02-03 NOTE — Procedures (Signed)
Interventional Radiology Procedure Note  Procedure: US guided left renal medical renal biopsy. .  Complications: None Recommendations:  - Ok to shower tomorrow - Do not submerge for 7 days - Routine care - 2 hr recovery   Signed,  Dulcy Fanny. Earleen Newport, DO

## 2019-02-03 NOTE — Progress Notes (Signed)
Discharge instructions reviewed with pt and her daughter. Both voice understanding.  

## 2019-02-04 ENCOUNTER — Encounter (HOSPITAL_COMMUNITY): Payer: Self-pay

## 2019-02-04 ENCOUNTER — Emergency Department (HOSPITAL_COMMUNITY)
Admission: EM | Admit: 2019-02-04 | Discharge: 2019-02-04 | Discharge status: Against Medical Advice | Payer: BC Managed Care – PPO

## 2019-02-04 ENCOUNTER — Inpatient Hospital Stay (HOSPITAL_COMMUNITY)
Admission: EM | Admit: 2019-02-04 | Discharge: 2019-02-18 | DRG: 264 | Disposition: A | Discharge status: Home / Self Care | Payer: BC Managed Care – PPO | Attending: Family Medicine | Admitting: Family Medicine

## 2019-02-04 ENCOUNTER — Other Ambulatory Visit: Payer: Self-pay

## 2019-02-04 ENCOUNTER — Emergency Department (HOSPITAL_COMMUNITY): Payer: BC Managed Care – PPO

## 2019-02-04 DIAGNOSIS — N2581 Secondary hyperparathyroidism of renal origin: Secondary | ICD-10-CM | POA: Diagnosis present

## 2019-02-04 DIAGNOSIS — R778 Other specified abnormalities of plasma proteins: Secondary | ICD-10-CM | POA: Diagnosis present

## 2019-02-04 DIAGNOSIS — Z809 Family history of malignant neoplasm, unspecified: Secondary | ICD-10-CM

## 2019-02-04 DIAGNOSIS — G4733 Obstructive sleep apnea (adult) (pediatric): Secondary | ICD-10-CM | POA: Diagnosis present

## 2019-02-04 DIAGNOSIS — Z888 Allergy status to other drugs, medicaments and biological substances status: Secondary | ICD-10-CM

## 2019-02-04 DIAGNOSIS — D631 Anemia in chronic kidney disease: Secondary | ICD-10-CM | POA: Diagnosis present

## 2019-02-04 DIAGNOSIS — R823 Hemoglobinuria: Secondary | ICD-10-CM | POA: Diagnosis present

## 2019-02-04 DIAGNOSIS — K219 Gastro-esophageal reflux disease without esophagitis: Secondary | ICD-10-CM | POA: Diagnosis present

## 2019-02-04 DIAGNOSIS — I161 Hypertensive emergency: Secondary | ICD-10-CM | POA: Diagnosis not present

## 2019-02-04 DIAGNOSIS — M898X9 Other specified disorders of bone, unspecified site: Secondary | ICD-10-CM | POA: Diagnosis present

## 2019-02-04 DIAGNOSIS — Y848 Other medical procedures as the cause of abnormal reaction of the patient, or of later complication, without mention of misadventure at the time of the procedure: Secondary | ICD-10-CM | POA: Diagnosis present

## 2019-02-04 DIAGNOSIS — Z79899 Other long term (current) drug therapy: Secondary | ICD-10-CM

## 2019-02-04 DIAGNOSIS — I272 Pulmonary hypertension, unspecified: Secondary | ICD-10-CM | POA: Diagnosis present

## 2019-02-04 DIAGNOSIS — N99840 Postprocedural hematoma of a genitourinary system organ or structure following a genitourinary system procedure: Secondary | ICD-10-CM | POA: Diagnosis present

## 2019-02-04 DIAGNOSIS — Z6841 Body Mass Index (BMI) 40.0 and over, adult: Secondary | ICD-10-CM

## 2019-02-04 DIAGNOSIS — N179 Acute kidney failure, unspecified: Secondary | ICD-10-CM | POA: Diagnosis present

## 2019-02-04 DIAGNOSIS — D62 Acute posthemorrhagic anemia: Secondary | ICD-10-CM | POA: Diagnosis present

## 2019-02-04 DIAGNOSIS — E785 Hyperlipidemia, unspecified: Secondary | ICD-10-CM | POA: Diagnosis present

## 2019-02-04 DIAGNOSIS — E8779 Other fluid overload: Secondary | ICD-10-CM | POA: Diagnosis present

## 2019-02-04 DIAGNOSIS — E1122 Type 2 diabetes mellitus with diabetic chronic kidney disease: Secondary | ICD-10-CM | POA: Diagnosis present

## 2019-02-04 DIAGNOSIS — I313 Pericardial effusion (noninflammatory): Secondary | ICD-10-CM | POA: Diagnosis present

## 2019-02-04 DIAGNOSIS — Z7982 Long term (current) use of aspirin: Secondary | ICD-10-CM

## 2019-02-04 DIAGNOSIS — Z833 Family history of diabetes mellitus: Secondary | ICD-10-CM

## 2019-02-04 DIAGNOSIS — Z7984 Long term (current) use of oral hypoglycemic drugs: Secondary | ICD-10-CM

## 2019-02-04 DIAGNOSIS — I498 Other specified cardiac arrhythmias: Secondary | ICD-10-CM | POA: Diagnosis not present

## 2019-02-04 DIAGNOSIS — Z87891 Personal history of nicotine dependence: Secondary | ICD-10-CM

## 2019-02-04 DIAGNOSIS — D72829 Elevated white blood cell count, unspecified: Secondary | ICD-10-CM | POA: Diagnosis present

## 2019-02-04 DIAGNOSIS — N186 End stage renal disease: Secondary | ICD-10-CM | POA: Diagnosis present

## 2019-02-04 DIAGNOSIS — Z992 Dependence on renal dialysis: Secondary | ICD-10-CM

## 2019-02-04 DIAGNOSIS — L299 Pruritus, unspecified: Secondary | ICD-10-CM | POA: Diagnosis not present

## 2019-02-04 DIAGNOSIS — Z8249 Family history of ischemic heart disease and other diseases of the circulatory system: Secondary | ICD-10-CM

## 2019-02-04 DIAGNOSIS — J9811 Atelectasis: Secondary | ICD-10-CM | POA: Diagnosis present

## 2019-02-04 DIAGNOSIS — Z20828 Contact with and (suspected) exposure to other viral communicable diseases: Secondary | ICD-10-CM | POA: Diagnosis present

## 2019-02-04 DIAGNOSIS — T465X6A Underdosing of other antihypertensive drugs, initial encounter: Secondary | ICD-10-CM | POA: Diagnosis present

## 2019-02-04 DIAGNOSIS — I120 Hypertensive chronic kidney disease with stage 5 chronic kidney disease or end stage renal disease: Secondary | ICD-10-CM | POA: Diagnosis present

## 2019-02-04 DIAGNOSIS — E872 Acidosis: Secondary | ICD-10-CM | POA: Diagnosis not present

## 2019-02-04 HISTORY — DX: Disorder of kidney and ureter, unspecified: N28.9

## 2019-02-04 LAB — URINALYSIS, ROUTINE W REFLEX MICROSCOPIC
Bilirubin Urine: NEGATIVE
Glucose, UA: 150 mg/dL — AB
Ketones, ur: 5 mg/dL — AB
Leukocytes,Ua: NEGATIVE
Nitrite: NEGATIVE
Protein, ur: >=300 mg/dL — AB
Specific Gravity, Urine: 1.017 (ref 1.005–1.030)
pH: 5 (ref 5.0–8.0)

## 2019-02-04 LAB — BASIC METABOLIC PANEL
Anion gap: 13 (ref 5–15)
BUN: 39 mg/dL — ABNORMAL HIGH (ref 6–20)
CO2: 19 mmol/L — ABNORMAL LOW (ref 22–32)
Calcium: 9.3 mg/dL (ref 8.9–10.3)
Chloride: 109 mmol/L (ref 98–111)
Creatinine, Ser: 5.07 mg/dL — ABNORMAL HIGH (ref 0.44–1.00)
GFR calc Af Amer: 10 mL/min — ABNORMAL LOW (ref >60)
GFR calc non Af Amer: 9 mL/min — ABNORMAL LOW (ref >60)
Glucose, Bld: 169 mg/dL — ABNORMAL HIGH (ref 70–99)
Potassium: 3.7 mmol/L (ref 3.5–5.1)
Sodium: 141 mmol/L (ref 135–145)

## 2019-02-04 LAB — TROPONIN I (HIGH SENSITIVITY): Troponin I (High Sensitivity): 34 ng/L — ABNORMAL HIGH (ref <18)

## 2019-02-04 LAB — CBC
HCT: 40.2 % (ref 36.0–46.0)
Hemoglobin: 13.2 g/dL (ref 12.0–15.0)
MCH: 26.9 pg (ref 26.0–34.0)
MCHC: 32.8 g/dL (ref 30.0–36.0)
MCV: 81.9 fL (ref 80.0–100.0)
Platelets: 371 10*3/uL (ref 150–400)
RBC: 4.91 MIL/uL (ref 3.87–5.11)
RDW: 16 % — ABNORMAL HIGH (ref 11.5–15.5)
WBC: 17.9 10*3/uL — ABNORMAL HIGH (ref 4.0–10.5)
nRBC: 0 % (ref 0.0–0.2)

## 2019-02-04 IMAGING — CR DG CHEST 2V
2 series · 2 of 2 positions shown · non-contrast
Comparison: Radiograph [DATE], CT [DATE]

CLINICAL DATA: Chest pain

EXAM:
CHEST - 2 VIEW

[chest lat]
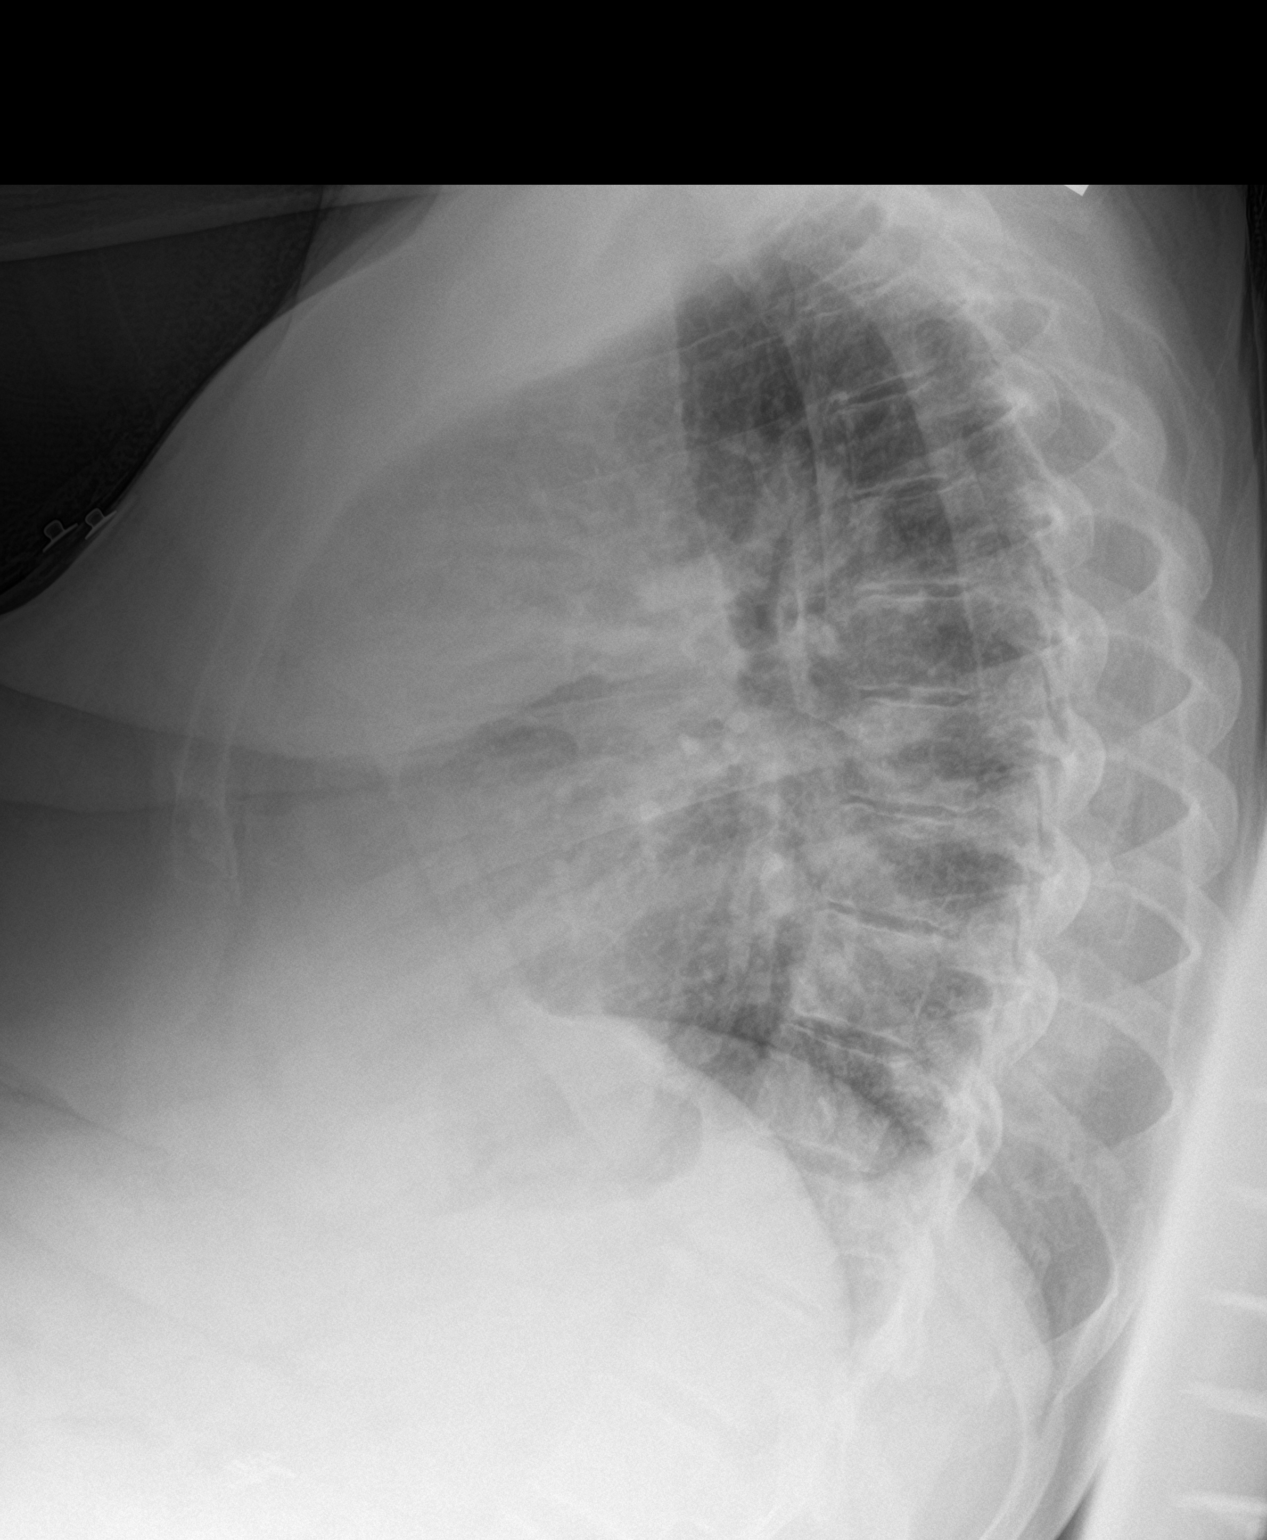

[chest ap]
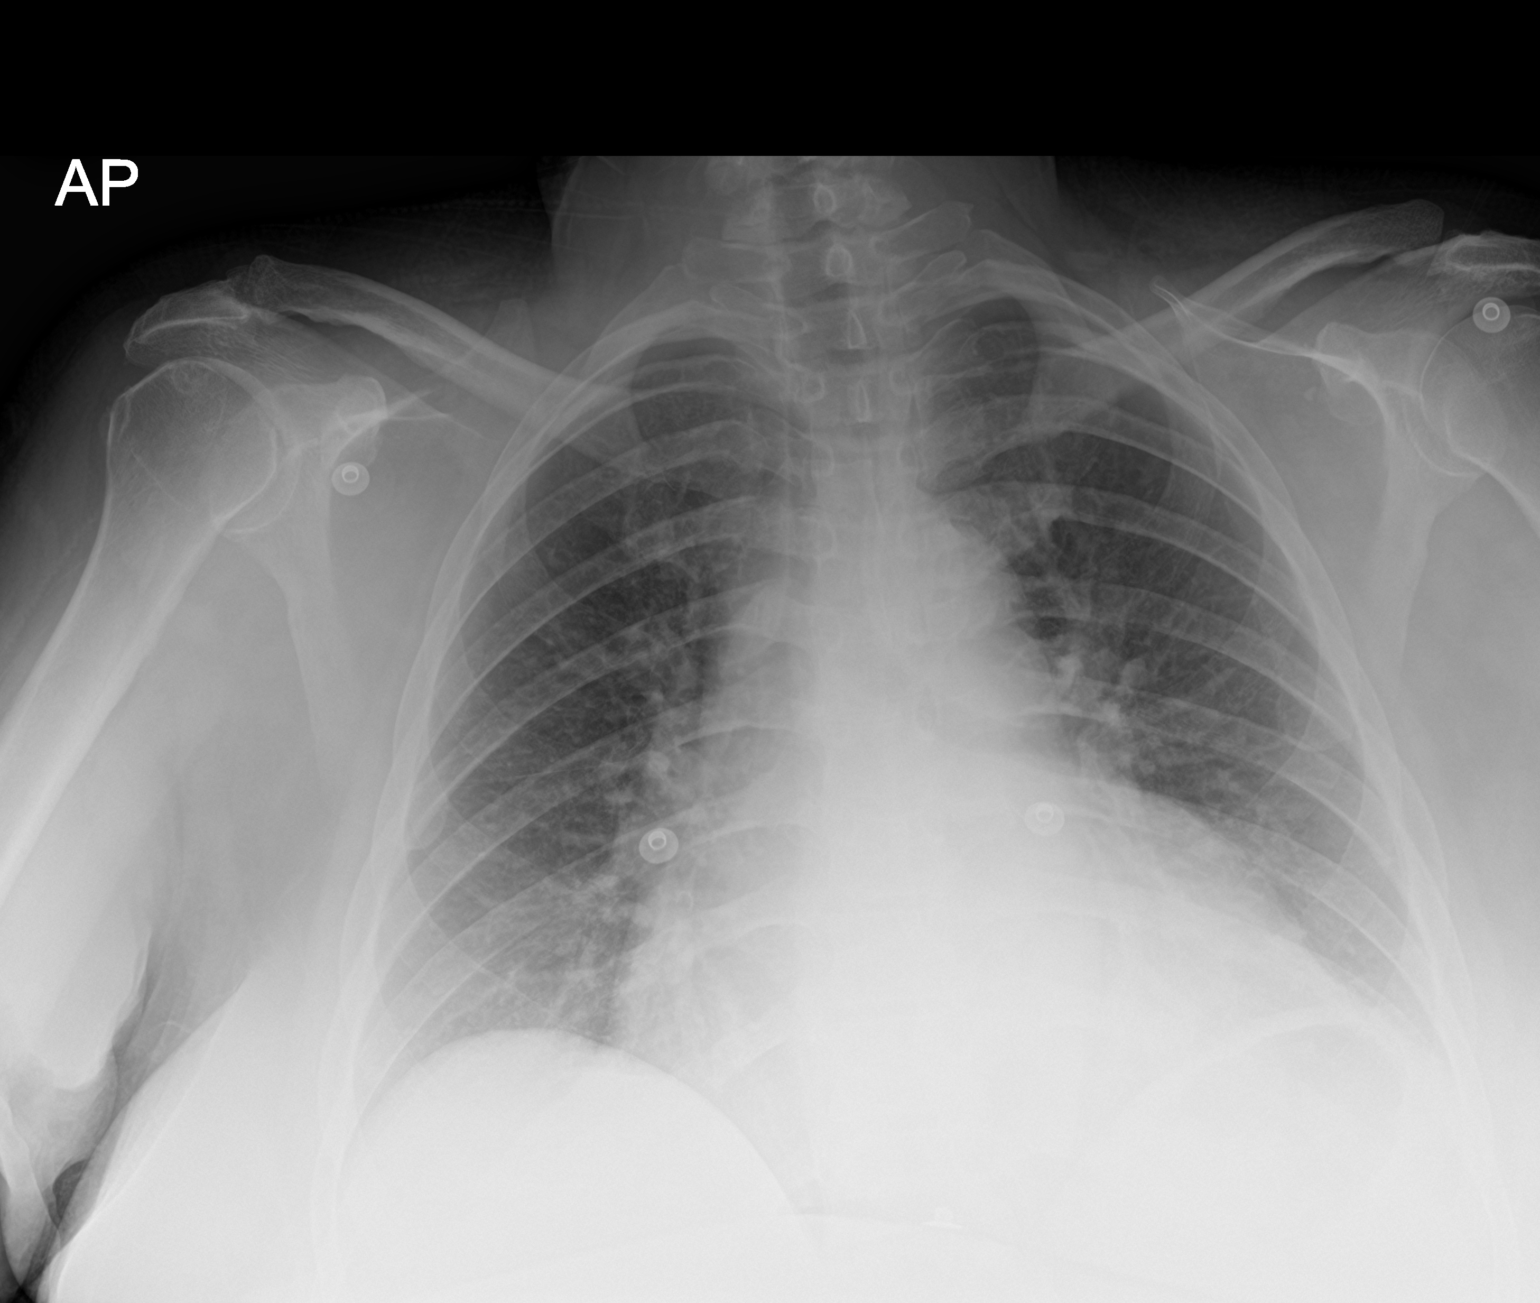

[2 of 2 positions shown; findings below may reference images not displayed]

FINDINGS: Lung volumes are diminished. Streaky basilar areas of atelectasis.
Accounting for body habitus, the lungs are otherwise clear. There is
cardiomegaly and abundant mediastinal fat, similar to priors.
Cardiomediastinal contours are otherwise unremarkable. Degenerative
changes are present in the imaged spine and shoulders.
IMPRESSION: Low lung volumes with bibasilar atelectasis. No other acute
cardiopulmonary abnormality.

Stable cardiomegaly.

## 2019-02-04 MED ORDER — SODIUM CHLORIDE 0.9 % IV BOLUS
1000.0000 mL | Freq: Once | INTRAVENOUS | Status: AC
  Administered 2019-02-04: 1000 mL via INTRAVENOUS

## 2019-02-04 MED ORDER — SODIUM CHLORIDE 0.9% FLUSH
3.0000 mL | Freq: Once | INTRAVENOUS | Status: AC
  Administered 2019-02-04: 22:00:00 3 mL via INTRAVENOUS

## 2019-02-04 MED ORDER — ONDANSETRON HCL 4 MG/2ML IJ SOLN
4.0000 mg | Freq: Once | INTRAMUSCULAR | Status: AC
  Administered 2019-02-04: 23:00:00 4 mg via INTRAVENOUS
  Filled 2019-02-04: qty 2

## 2019-02-04 MED ORDER — MORPHINE SULFATE (PF) 4 MG/ML IV SOLN
4.0000 mg | Freq: Once | INTRAVENOUS | Status: AC
  Administered 2019-02-04: 4 mg via INTRAVENOUS
  Filled 2019-02-04: qty 1

## 2019-02-04 NOTE — ED Provider Notes (Signed)
[facility] EMERGENCY DEPARTMENT Provider Note   CSN: [id number] Arrival date & time: 02/04/19  2142     History   Chief Complaint Chief Complaint  Patient presents with   Chest Pain   Emesis    HPI [name] is a 58 y.o. female past medical history significant for type 2 diabetes on metformin, GERD, hyperlipidemia, hypertension, CKD presents to emergency department today with chief complaint of chest pain and emesis.   Pt had an ultrasound guided random renal biopsy under sedation yesterday with Dr. Earleen Newport in an effort to evaluate her worsening kidney function.  Results are not yet available from the biopsy.  Patient's daughter is at the bedside and is contributing historian.  She states the biopsy was done outpatient and patient was sent home in stable condition.  Several hours later while at home patient began to feel nauseous.  She had multiple episodes of emesis.  She called the nephrologist who recommended she try drinking Gatorade. Unfortunately she continued to have vomiting despite the gatorade.  She estimates at least 15-20 episodes of nonbloody nonbilious emesis in the last 24 hours.  Patient reports generalized abdominal pain.  She describes it out of her abdominal muscles feel sore because she has had multiple episodes of emesis. Patient states while in the ambulance on the way to the hospital she started to feel chest pain.  Pain is located in the left side of her chest.  It does not radiate.  She describes it as sharp sensation and she rates the pain 7 out of 10 in severity.  Patient was noted to be hypertensive for EMS to 790 systolic.  Patient states she has not taken her hypertension medications today because she has been vomiting.  She notes her blood pressure always improves after her meds.  Chart review shows patient was seen in the clinic x3 months ago and changes were made to her hypertension medications including discontinuing HCTZ.  It was  recommended that she continue clonidine, spironolactone, carvedilol, amlodipine.  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   Diabetic gastroparesis ([place]) 08/09/2015   Accelerated hypertension    Type 2 diabetes mellitus without complication, without long-term current use of insulin ([facility])    Low back pain 06/09/2014   Obstructive sleep apnea of adult 01/16/2012   GERD (gastroesophageal reflux disease) 01/16/2012   Osteoarthritis of both knees 01/16/2012   Hypercholesteremia 07/18/2011   LEIOMYOMA, UTERUS 01/14/2007   Morbid obesity ([place]) 07/03/2006   Former smoker 07/03/2006   Tension headache 07/03/2006   HYPERTENSION, BENIGN SYSTEMIC 07/03/2006    Past Surgical History:  Procedure Laterality Date   BREAST SURGERY     reduction   CESAREAN SECTION     x2   CHOLECYSTECTOMY     laparoscopic   REDUCTION MAMMAPLASTY Bilateral      OB History   No obstetric history on file.      Home Medications    Prior to Admission medications  Medication Sig Start Date End Date Taking? Authorizing Provider  Accu-Chek Softclix Lancets lancets Use as instructed 09/29/18   Diallo, Earna Coder, MD  acetaminophen (TYLENOL) 500 MG tablet Take 1,000 mg by mouth every 6 (six) hours as needed for moderate pain or headache.    [provider]  amLODipine (NORVASC) 10 MG tablet TAKE 1 TABLET BY MOUTH ONCE DAILY Patient taking differently: Take 10 mg by mouth daily.  01/06/18   Diallo,  Earna Coder, MD  aspirin EC 325 MG tablet Take 325 mg by mouth daily.    [provider]  atorvastatin (LIPITOR) 20 MG tablet TAKE 1 TABLET BY MOUTH ONCE DAILY AT  6PM Patient taking differently: Take 20 mg by mouth daily at 6 PM.  09/16/17   [name], MD  Blood Glucose Monitoring Suppl (ACCU-CHEK AVIVA PLUS) w/Device KIT Use to check sugar three times a day 05/14/17   [name], MD  carvedilol (COREG) 25 MG tablet Take 1 tablet (25 mg total) by mouth 2 (two) times daily with a meal. 07/01/18   Diallo, Abdoulaye, MD  cloNIDine (CATAPRES) 0.2 MG tablet Take 0.2 mg by mouth 3 (three) times daily.    [provider]  ferrous sulfate 325 (65 FE) MG tablet Take 325 mg by mouth daily with breakfast.    [provider]  furosemide (LASIX) 40 MG tablet Take 40 mg by mouth 2 (two) times daily.    [provider]  glucose blood (ACCU-CHEK AVIVA PLUS) test strip Use as instructed 09/29/18   Diallo, Earna Coder, MD  Lancets (ACCU-CHEK SOFT TOUCH) lancets Use to check sugars three times a day 09/24/18   Diallo, Earna Coder, MD  polyethylene glycol (MIRALAX / GLYCOLAX) packet Take 17 g by mouth daily as needed for moderate constipation. 08/06/17   [name], MD  potassium chloride SA (K-DUR) 20 MEQ tablet Take 20 mEq by mouth daily.    [provider]  traMADol (ULTRAM) 50 MG tablet TAKE 1 TABLET BY MOUTH EVERY 8 HOURS AS NEEDED Patient taking differently: Take 50 mg by mouth 2 (two) times daily as needed for moderate pain.  02/17/18   Diallo, Earna Coder, MD  Vitamin D, Ergocalciferol, (DRISDOL) 1.25 MG (50000 UT) CAPS capsule Take 50,000 Units by mouth every 7 (seven) days. For 5 weeks    [provider]    Family History Family History  Problem Relation Age of Onset   Hypertension Mother    Diabetes Mother    Cancer Father        lung   Colon cancer Neg Hx    Stomach cancer Neg Hx    Esophageal cancer Neg Hx     Social  History Social History   Tobacco Use   Smoking status: Former Smoker    Years: 10.00    Types: Cigarettes    Quit date: 10/16/2011    Years since quitting: 7.3   Smokeless tobacco: Never Used   Tobacco comment: 1 pack will last a week  Substance Use Topics   Alcohol use: No    Alcohol/week: 0.0 standard drinks   Drug use: No     Allergies   Lisinopril and Jardiance [empagliflozin]   Review of Systems Review of Systems  Constitutional: Negative for chills and fever.  HENT: Negative for congestion, ear discharge, ear pain, sinus pressure, sinus pain and sore throat.   Eyes: Negative for pain and redness.  Respiratory: Negative for cough and shortness of breath.   Cardiovascular: Positive for  chest pain.  Gastrointestinal: Positive for abdominal pain, nausea and vomiting. Negative for constipation and diarrhea.  Genitourinary: Negative for dysuria and hematuria.  Musculoskeletal: Negative for back pain and neck pain.  Skin: Negative for wound.  Neurological: Negative for weakness, numbness and headaches.     Physical Exam Updated Vital Signs BP (!) 216/85 (BP Location: Right Arm)    Pulse (!) 110    Temp 98.5 F (36.9 C) (Oral)    Resp 18    SpO2 98%   Physical Exam Vitals signs and nursing note reviewed.  Constitutional:      General: She is not in acute distress.    Appearance: She is not ill-appearing.  HENT:     Head: Normocephalic and atraumatic.     Right Ear: Tympanic membrane and external ear normal.     Left Ear: Tympanic membrane and external ear normal.     Nose: Nose normal.     Mouth/Throat:     Mouth: Mucous membranes are dry.     Pharynx: Oropharynx is clear.  Eyes:     General: No scleral icterus.       Right eye: No discharge.        Left eye: No discharge.     Extraocular Movements: Extraocular movements intact.     Conjunctiva/sclera: Conjunctivae normal.     Pupils: Pupils are equal, round, and reactive to light.  Neck:      Musculoskeletal: Normal range of motion.     Vascular: No JVD.  Cardiovascular:     Rate and Rhythm: Regular rhythm. Tachycardia present.     Pulses: Normal pulses.          Radial pulses are 2+ on the right side and 2+ on the left side.     Heart sounds: Normal heart sounds.  Pulmonary:     Comments: Lungs clear to auscultation in all fields. Symmetric chest rise. No wheezing, rales, or rhonchi. Abdominal:     Comments: Abdomen is obese. Soft, non-distended with generalized tenderness. no rigidity, no guarding. No peritoneal signs.  Musculoskeletal: Normal range of motion.  Skin:    General: Skin is warm and dry.     Capillary Refill: Capillary refill takes less than 2 seconds.  Neurological:     Mental Status: She is oriented to person, place, and time.     GCS: GCS eye subscore is 4. GCS verbal subscore is 5. GCS motor subscore is 6.     Comments: Fluent speech, no facial droop.  Psychiatric:        Behavior: Behavior normal.      ED Treatments / Results  Labs (all labs ordered are listed, but only abnormal results are displayed) Labs Reviewed  BASIC METABOLIC PANEL - Abnormal; Notable for the following components:      Result Value   CO2 19 (*)    Glucose, Bld 169 (*)    BUN 39 (*)    Creatinine, Ser 5.07 (*)    GFR calc non Af Amer 9 (*)    GFR calc Af Amer 10 (*)    All other components within normal limits  CBC - Abnormal; Notable for the following components:   WBC 17.9 (*)    RDW 16.0 (*)    All other components within normal limits  TROPONIN I (HIGH SENSITIVITY) - Abnormal; Notable for the following components:   Troponin I (High Sensitivity) 34 (*)    All other components within normal limits  URINALYSIS, ROUTINE W  REFLEX MICROSCOPIC    EKG EKG Interpretation  Date/Time:  Thursday February 04 2019 22:12:45 EDT Ventricular Rate:  111 PR Interval:  154 QRS Duration: 98 QT Interval:  350 QTC Calculation: 476 R Axis:   22 Text Interpretation:  Sinus  tachycardia Left ventricular hypertrophy with repolarization abnormality Abnormal ECG Confirmed by Dene Gentry [phone]) on 02/04/2019 10:38:31 PM   Radiology Dg Chest 2 View  Result Date: 02/04/2019 CLINICAL DATA:  Chest pain EXAM: CHEST - 2 VIEW COMPARISON:  Radiograph 08/06/2015, CT 01/19/2012 FINDINGS: Lung volumes are diminished. Streaky basilar areas of atelectasis. Accounting for body habitus, the lungs are otherwise clear. There is cardiomegaly and abundant mediastinal fat, similar to priors. Cardiomediastinal contours are otherwise unremarkable. Degenerative changes are present in the imaged spine and shoulders. IMPRESSION: Low lung volumes with bibasilar atelectasis. No other acute cardiopulmonary abnormality. Stable cardiomegaly. Electronically Signed   By: Lovena Le M.D.   On: 02/04/2019 22:48   US Biopsy (kidney)  Result Date: 02/03/2019 INDICATION: 58 year old female with a history of proteinuria and renal disease EXAM: IMAGE GUIDED medical renal biopsy MEDICATIONS: None. ANESTHESIA/SEDATION: Moderate (conscious) sedation was employed during this procedure. A total of Versed 1.5 mg and Fentanyl 75 mcg was administered intravenously. Moderate Sedation Time: 13 minutes. The patient's level of consciousness and vital signs were monitored continuously by radiology nursing throughout the procedure under my direct supervision. FLUOROSCOPY TIME:  None). COMPLICATIONS: None PROCEDURE: Informed written consent was obtained from the patient after a thorough discussion of the procedural risks, benefits and alternatives. All questions were addressed. Maximal Sterile Barrier Technique was utilized including caps, mask, sterile gowns, sterile gloves, sterile drape, hand hygiene and skin antiseptic. A timeout was performed prior to the initiation of the procedure. Patient was positioned prone position on the gantry table. Images were stored sent to PACs. Once the patient is prepped and draped in the  usual sterile fashion, the skin and subcutaneous tissues overlying the left kidney were generously infiltrated 1% lidocaine for local anesthesia. Using ultrasound guidance, a 15 gauge guide needle was advanced into the lower cortex of the left kidney. Once we confirmed location of the needle tip, 3 separate 16 gauge core biopsy were achieved. Two Gel-Foam pledgets were infused with a small amount of saline. The needle was removed. Final images were stored. The patient tolerated the procedure well and remained hemodynamically stable throughout. No complications were encountered and no significant blood loss encountered. IMPRESSION: Status post image guided biopsy of left kidney for medical renal purpose Signed, Dulcy Fanny. Dellia Nims, RPVI [facility] Electronically Signed   By: Corrie Mckusick D.O.   On: 02/03/2019 11:27    Procedures Procedures (including critical care time)  Medications Ordered in ED Medications  morphine 4 MG/ML injection 4 mg (has no administration in time range)  sodium chloride flush (NS) 0.9 % injection 3 mL (3 mLs Intravenous Given 02/04/19 2203)  ondansetron (ZOFRAN) injection 4 mg (4 mg Intravenous Given 02/04/19 2251)  sodium chloride 0.9 % bolus 1,000 mL (1,000 mLs Intravenous New Bag/Given 02/04/19 2249)     Initial Impression / Assessment and Plan / ED Course  I have reviewed the triage vital signs and the nursing notes.  Pertinent labs & imaging results that were available during my care of the patient were reviewed by me and considered in my medical decision making (see chart for details).  Patient seen and examined. Patient appears to not feel well however does not appear to  be septic.  On arrival she was tachycardic to 110 with elevated blood pressure of 216/85.  She is afebrile, not tachypneic or hypoxic.  Mucous membranes are dry.  Lungs are to auscultation all fields.  Abdomen with generalized tenderness, no  peritoneal signs.  She is actively vomiting during exam. EKG with sinus tachycardia, no ischemic changes. Labs are significant for leukocytosis of 17.9 with hemoglobin of 13.2, appears to be hemoconcentrated as hemoglobin yesterday preop labs show hemoglobin of 9.7. BMP shows creatinine of 5.07.  Compared to months ago this is elevated from 3.35.  Troponin is 34.  UA is without signs of infection.  IV fluids, pain and nausea medicine given. Chest xray viewed by me shows lung volumes with bibasilar atelectasis, no infiltrate seen.  Discussed with ED attending Dr. Francia Greaves who agrees with plan of symptomatic care and reassess to see if vital signs and nausea have improved.  On reassessment vitals have not improved and tachycardia has worsened. Case discussed with oncoming team to determine treatment plan. Plan to add lactic acid, blood cultures, second liter IVF.  Patient care transferred to [facility]. McDonald PA-C at the end of my shift. Patient presentation, ED course, and plan of care discussed with review of all pertinent labs and imaging. Please see her note for further details regarding further ED course and disposition. Anticipate admission.   Portions of this note were generated with [employer]. Dictation errors may occur despite best attempts at proofreading.  Vitals:   02/05/19 0000 02/05/19 0015 02/05/19 0033 02/05/19 0047  BP: (!) 191/91 (!) 218/83  (!) 222/81  Pulse: (!) 118 (!) 122    Resp: (!) 26 (!) 29    Temp:   99.1 F (37.3 C) 98.9 F (37.2 C)  TempSrc:   Oral Rectal  SpO2: 100% 97%       Final Clinical Impressions(s) / ED Diagnoses   Final diagnoses:  None    ED Discharge Orders    None       Cherre Robins, PA-C 02/05/19 0050    Valarie Merino, MD 02/05/19 2253

## 2019-02-04 NOTE — ED Notes (Signed)
Pt returned from xray

## 2019-02-04 NOTE — ED Triage Notes (Signed)
Pt from home with ems for initial c.o n/v since having a kidney biopsy done earlier today. Pt also reports centralized chest pain that started about 1 hr ago. Pt hypertensive 220/118 with ems. Pt arrives to ED a.o, nad noted

## 2019-02-05 ENCOUNTER — Emergency Department (HOSPITAL_COMMUNITY): Payer: BC Managed Care – PPO

## 2019-02-05 ENCOUNTER — Inpatient Hospital Stay (HOSPITAL_COMMUNITY): Payer: BC Managed Care – PPO

## 2019-02-05 DIAGNOSIS — Y848 Other medical procedures as the cause of abnormal reaction of the patient, or of later complication, without mention of misadventure at the time of the procedure: Secondary | ICD-10-CM | POA: Diagnosis present

## 2019-02-05 DIAGNOSIS — N179 Acute kidney failure, unspecified: Secondary | ICD-10-CM | POA: Diagnosis present

## 2019-02-05 DIAGNOSIS — N185 Chronic kidney disease, stage 5: Secondary | ICD-10-CM | POA: Diagnosis not present

## 2019-02-05 DIAGNOSIS — N184 Chronic kidney disease, stage 4 (severe): Secondary | ICD-10-CM | POA: Diagnosis not present

## 2019-02-05 DIAGNOSIS — I161 Hypertensive emergency: Secondary | ICD-10-CM | POA: Diagnosis present

## 2019-02-05 DIAGNOSIS — I361 Nonrheumatic tricuspid (valve) insufficiency: Secondary | ICD-10-CM | POA: Diagnosis not present

## 2019-02-05 DIAGNOSIS — I313 Pericardial effusion (noninflammatory): Secondary | ICD-10-CM | POA: Diagnosis present

## 2019-02-05 DIAGNOSIS — N186 End stage renal disease: Secondary | ICD-10-CM | POA: Diagnosis present

## 2019-02-05 DIAGNOSIS — N99840 Postprocedural hematoma of a genitourinary system organ or structure following a genitourinary system procedure: Secondary | ICD-10-CM | POA: Diagnosis present

## 2019-02-05 DIAGNOSIS — M898X9 Other specified disorders of bone, unspecified site: Secondary | ICD-10-CM | POA: Diagnosis present

## 2019-02-05 DIAGNOSIS — K219 Gastro-esophageal reflux disease without esophagitis: Secondary | ICD-10-CM | POA: Diagnosis present

## 2019-02-05 DIAGNOSIS — N2581 Secondary hyperparathyroidism of renal origin: Secondary | ICD-10-CM | POA: Diagnosis present

## 2019-02-05 DIAGNOSIS — E1122 Type 2 diabetes mellitus with diabetic chronic kidney disease: Secondary | ICD-10-CM | POA: Diagnosis present

## 2019-02-05 DIAGNOSIS — J9811 Atelectasis: Secondary | ICD-10-CM | POA: Diagnosis present

## 2019-02-05 DIAGNOSIS — E119 Type 2 diabetes mellitus without complications: Secondary | ICD-10-CM | POA: Diagnosis not present

## 2019-02-05 DIAGNOSIS — G4733 Obstructive sleep apnea (adult) (pediatric): Secondary | ICD-10-CM | POA: Diagnosis present

## 2019-02-05 DIAGNOSIS — D72829 Elevated white blood cell count, unspecified: Secondary | ICD-10-CM | POA: Diagnosis present

## 2019-02-05 DIAGNOSIS — I498 Other specified cardiac arrhythmias: Secondary | ICD-10-CM | POA: Diagnosis not present

## 2019-02-05 DIAGNOSIS — E8779 Other fluid overload: Secondary | ICD-10-CM | POA: Diagnosis present

## 2019-02-05 DIAGNOSIS — Z6841 Body Mass Index (BMI) 40.0 and over, adult: Secondary | ICD-10-CM | POA: Diagnosis not present

## 2019-02-05 DIAGNOSIS — Z20828 Contact with and (suspected) exposure to other viral communicable diseases: Secondary | ICD-10-CM | POA: Diagnosis present

## 2019-02-05 DIAGNOSIS — M7989 Other specified soft tissue disorders: Secondary | ICD-10-CM | POA: Diagnosis not present

## 2019-02-05 DIAGNOSIS — D62 Acute posthemorrhagic anemia: Secondary | ICD-10-CM | POA: Diagnosis present

## 2019-02-05 DIAGNOSIS — I120 Hypertensive chronic kidney disease with stage 5 chronic kidney disease or end stage renal disease: Secondary | ICD-10-CM | POA: Diagnosis present

## 2019-02-05 DIAGNOSIS — M79609 Pain in unspecified limb: Secondary | ICD-10-CM

## 2019-02-05 DIAGNOSIS — L299 Pruritus, unspecified: Secondary | ICD-10-CM | POA: Diagnosis not present

## 2019-02-05 DIAGNOSIS — N189 Chronic kidney disease, unspecified: Secondary | ICD-10-CM | POA: Diagnosis not present

## 2019-02-05 DIAGNOSIS — D631 Anemia in chronic kidney disease: Secondary | ICD-10-CM | POA: Diagnosis present

## 2019-02-05 DIAGNOSIS — E872 Acidosis: Secondary | ICD-10-CM | POA: Diagnosis not present

## 2019-02-05 DIAGNOSIS — E785 Hyperlipidemia, unspecified: Secondary | ICD-10-CM | POA: Diagnosis present

## 2019-02-05 HISTORY — DX: Hypertensive emergency: I16.1

## 2019-02-05 LAB — POCT I-STAT 7, (LYTES, BLD GAS, ICA,H+H)
Acid-base deficit: 6 mmol/L — ABNORMAL HIGH (ref 0.0–2.0)
Bicarbonate: 17.7 mmol/L — ABNORMAL LOW (ref 20.0–28.0)
Calcium, Ion: 1.21 mmol/L (ref 1.15–1.40)
HCT: 34 % — ABNORMAL LOW (ref 36.0–46.0)
Hemoglobin: 11.6 g/dL — ABNORMAL LOW (ref 12.0–15.0)
O2 Saturation: 95 %
Potassium: 3.8 mmol/L (ref 3.5–5.1)
Sodium: 144 mmol/L (ref 135–145)
TCO2: 19 mmol/L — ABNORMAL LOW (ref 22–32)
pCO2 arterial: 30 mmHg — ABNORMAL LOW (ref 32.0–48.0)
pH, Arterial: 7.379 (ref 7.350–7.450)
pO2, Arterial: 74 mmHg — ABNORMAL LOW (ref 83.0–108.0)

## 2019-02-05 LAB — CBC
HCT: 33.6 % — ABNORMAL LOW (ref 36.0–46.0)
Hemoglobin: 10.6 g/dL — ABNORMAL LOW (ref 12.0–15.0)
MCH: 26.3 pg (ref 26.0–34.0)
MCHC: 31.5 g/dL (ref 30.0–36.0)
MCV: 83.4 fL (ref 80.0–100.0)
Platelets: 349 10*3/uL (ref 150–400)
RBC: 4.03 MIL/uL (ref 3.87–5.11)
RDW: 16.3 % — ABNORMAL HIGH (ref 11.5–15.5)
WBC: 15.7 10*3/uL — ABNORMAL HIGH (ref 4.0–10.5)
nRBC: 0 % (ref 0.0–0.2)

## 2019-02-05 LAB — CREATININE, SERUM
Creatinine, Ser: 5.76 mg/dL — ABNORMAL HIGH (ref 0.44–1.00)
GFR calc Af Amer: 9 mL/min — ABNORMAL LOW (ref >60)
GFR calc non Af Amer: 7 mL/min — ABNORMAL LOW (ref >60)

## 2019-02-05 LAB — HEPATIC FUNCTION PANEL
ALT: 21 U/L (ref 0–44)
AST: 22 U/L (ref 15–41)
Albumin: 2.8 g/dL — ABNORMAL LOW (ref 3.5–5.0)
Alkaline Phosphatase: 141 U/L — ABNORMAL HIGH (ref 38–126)
Bilirubin, Direct: 0.2 mg/dL (ref 0.0–0.2)
Indirect Bilirubin: 0.6 mg/dL (ref 0.3–0.9)
Total Bilirubin: 0.8 mg/dL (ref 0.3–1.2)
Total Protein: 7.7 g/dL (ref 6.5–8.1)

## 2019-02-05 LAB — CBG MONITORING, ED: Glucose-Capillary: 176 mg/dL — ABNORMAL HIGH (ref 70–99)

## 2019-02-05 LAB — MRSA PCR SCREENING: MRSA by PCR: NEGATIVE

## 2019-02-05 LAB — LACTIC ACID, PLASMA
Lactic Acid, Venous: 0.8 mmol/L (ref 0.5–1.9)
Lactic Acid, Venous: 1.3 mmol/L (ref 0.5–1.9)

## 2019-02-05 LAB — D-DIMER, QUANTITATIVE: D-Dimer, Quant: 2.68 ug/mL-FEU — ABNORMAL HIGH (ref 0.00–0.50)

## 2019-02-05 LAB — GLUCOSE, CAPILLARY
Glucose-Capillary: 164 mg/dL — ABNORMAL HIGH (ref 70–99)
Glucose-Capillary: 99 mg/dL (ref 70–99)
Glucose-Capillary: 171 mg/dL — ABNORMAL HIGH (ref 70–99)

## 2019-02-05 LAB — BRAIN NATRIURETIC PEPTIDE: B Natriuretic Peptide: 1269 pg/mL — ABNORMAL HIGH (ref 0.0–100.0)

## 2019-02-05 LAB — SARS CORONAVIRUS 2 BY RT PCR (HOSPITAL ORDER, PERFORMED IN ~~LOC~~ HOSPITAL LAB): SARS Coronavirus 2: NEGATIVE

## 2019-02-05 LAB — HIV ANTIBODY (ROUTINE TESTING W REFLEX): HIV Screen 4th Generation wRfx: NONREACTIVE

## 2019-02-05 LAB — ECHOCARDIOGRAM COMPLETE

## 2019-02-05 LAB — TROPONIN I (HIGH SENSITIVITY)
Troponin I (High Sensitivity): 47 ng/L — ABNORMAL HIGH (ref <18)
Troponin I (High Sensitivity): 71 ng/L — ABNORMAL HIGH (ref <18)

## 2019-02-05 LAB — SARS CORONAVIRUS 2 (TAT 6-24 HRS): SARS Coronavirus 2: NEGATIVE

## 2019-02-05 IMAGING — CT CT CHEST W/O CM
2 of 4 series · 16 of 46 positions shown, 18 images · non-contrast
Comparison: Abdominal CT [DATE]

CLINICAL DATA: Nausea and vomiting. Kidney biopsy earlier today.
Chest pain and marked hypertension

EXAM:
CT CHEST, ABDOMEN AND PELVIS WITHOUT CONTRAST
TECHNIQUE: Multidetector CT imaging of the chest, abdomen and pelvis was
performed following the standard protocol without IV contrast.

[Series 3: cap without · axial · non-contrast · 0.98mm/px · z∈[+873,+1458]mm · 13 of 137 slices shown, 15 images]
[im 10/137  soft-tissue]
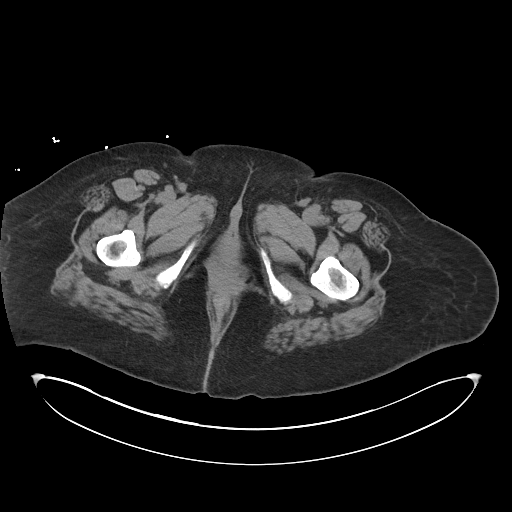
[im 10/137  bone]
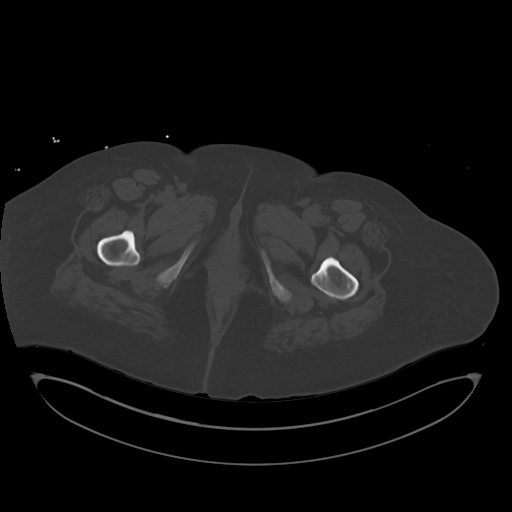
[im 20/137  soft-tissue]
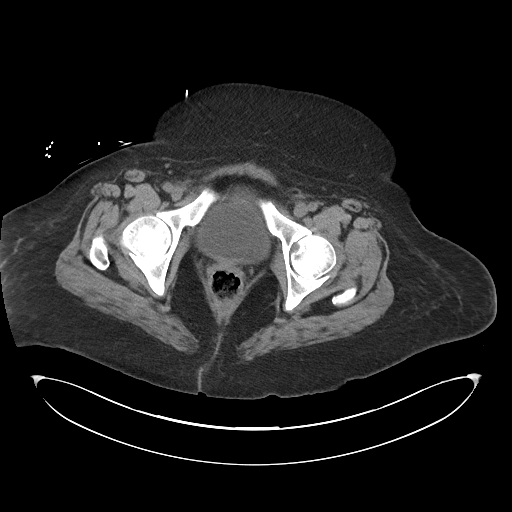
[im 30/137  soft-tissue]
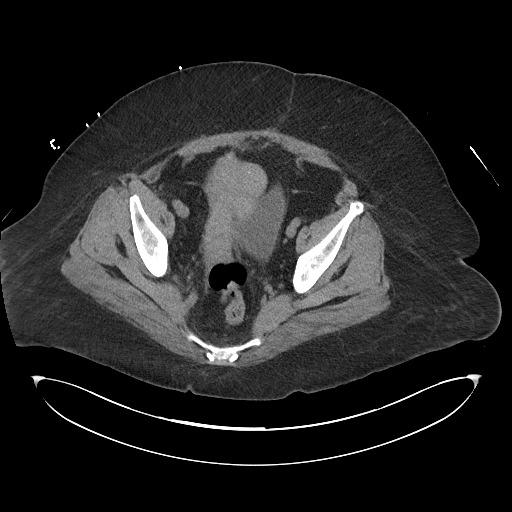
[im 39/137  soft-tissue]
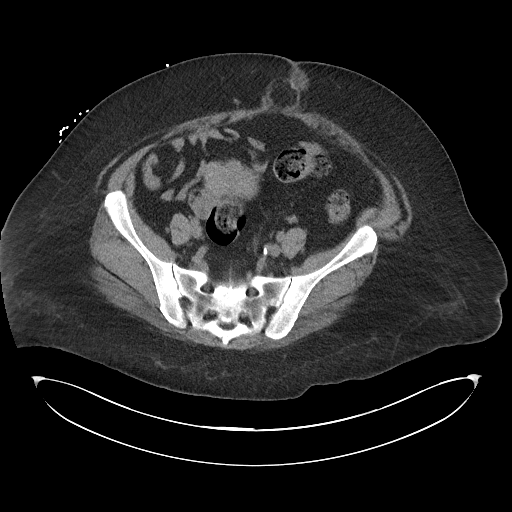
[im 49/137  soft-tissue]
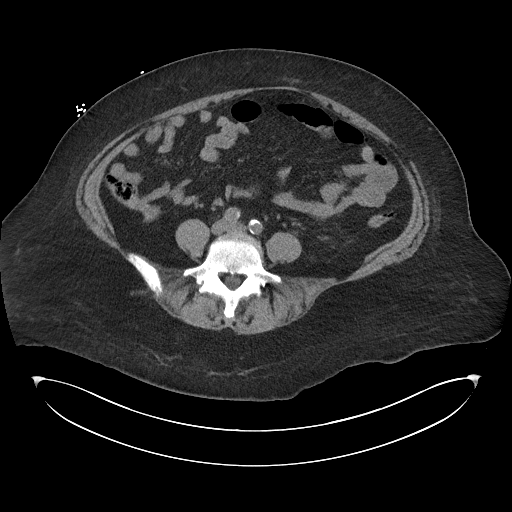
[im 59/137  soft-tissue]
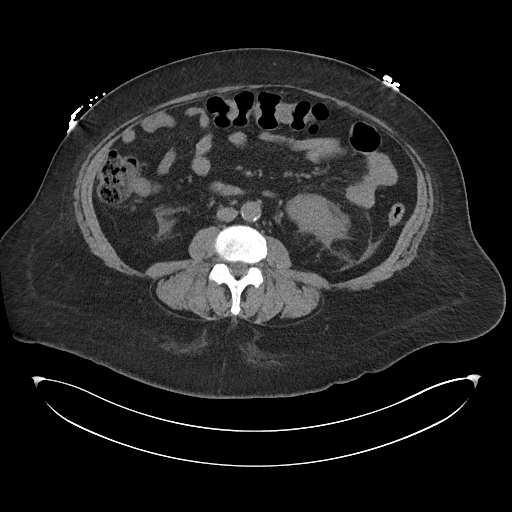
[im 69/137  soft-tissue]
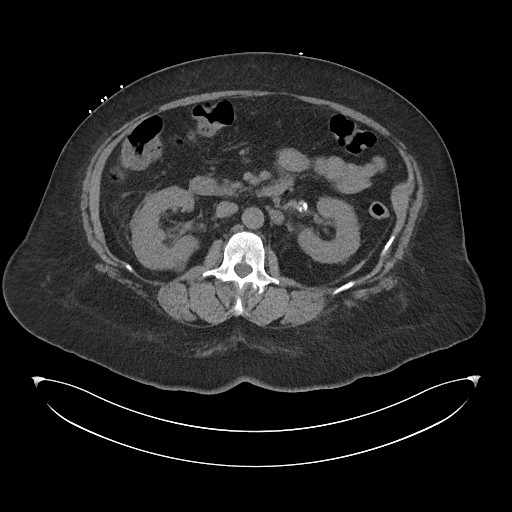
[im 78/137  soft-tissue]
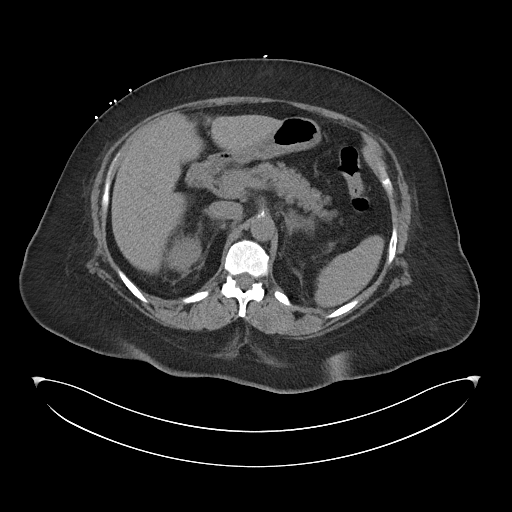
[im 88/137  soft-tissue]
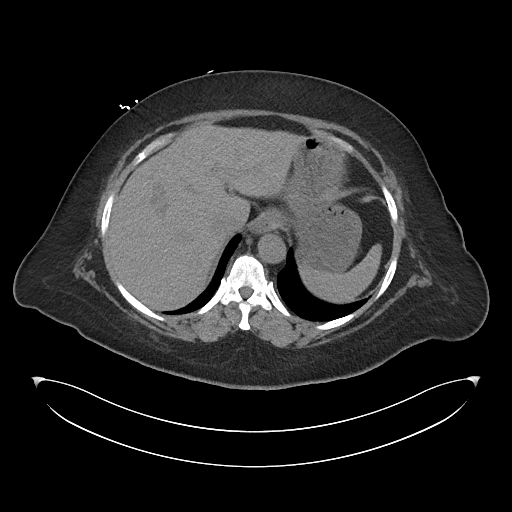
[im 88/137  bone]
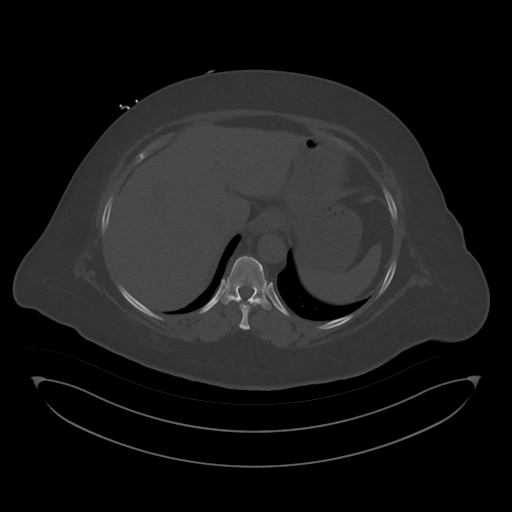
[im 98/137  soft-tissue]
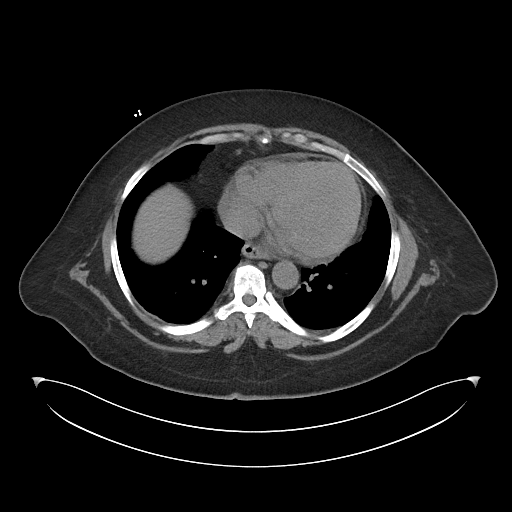
[im 107/137  soft-tissue]
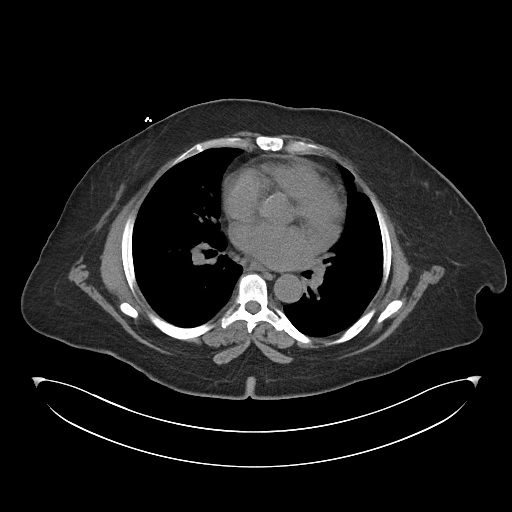
[im 117/137  soft-tissue]
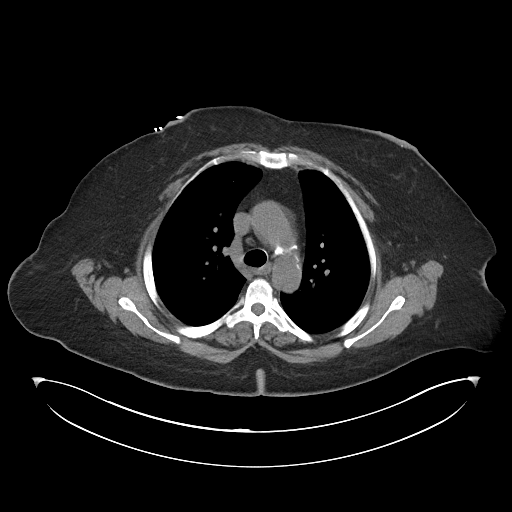
[im 127/137  soft-tissue]
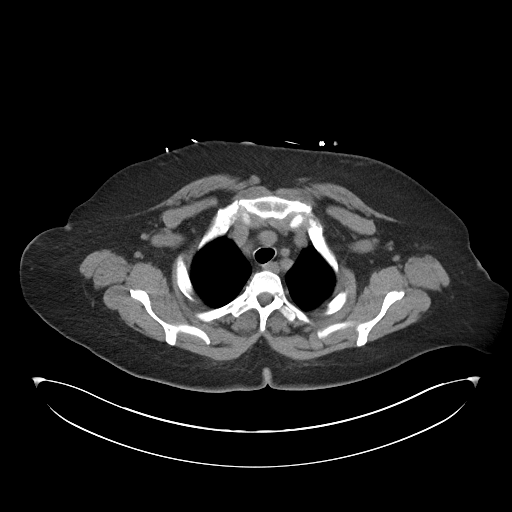

[Series 6: cor · coronal · 1.00mm/px · 3 of 129 slices shown]
[im 43/129  soft-tissue]
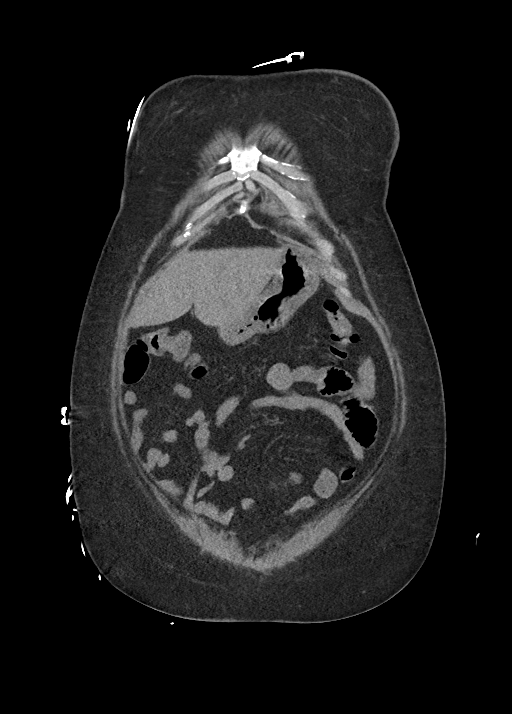
[im 57/129  soft-tissue]
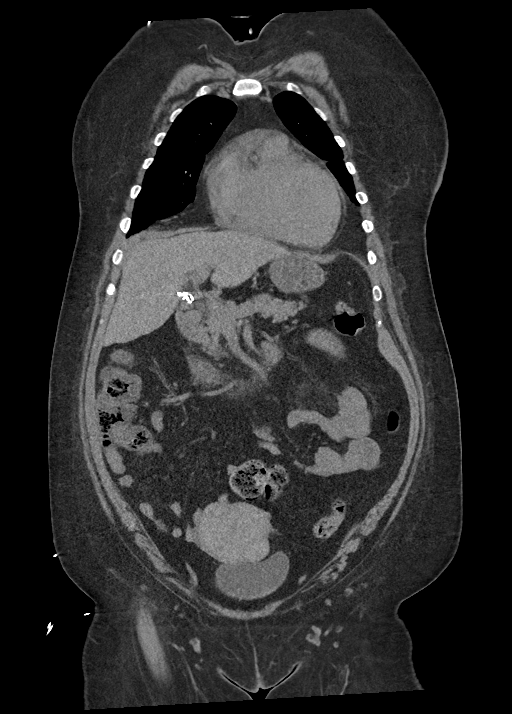
[im 72/129  soft-tissue]
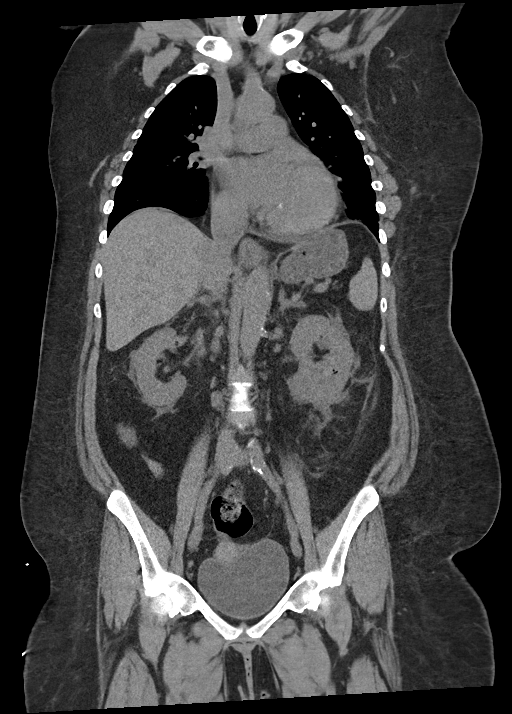

[16 of 46 positions shown; findings below may reference images not displayed]

FINDINGS: CT CHEST FINDINGS

Cardiovascular: Borderline heart size. Trace pericardial fluid or
less likely thickening. Mild atherosclerotic calcification of the
aorta.

Mediastinum/Nodes: No adenopathy.

Lungs/Pleura: There is no edema, consolidation, effusion, or
pneumothorax.

Musculoskeletal: No acute or aggressive finding

CT ABDOMEN PELVIS FINDINGS

Hepatobiliary: No focal liver abnormality.Cholecystectomy. No bile
duct dilatation

Pancreas: Unremarkable.

Spleen: Unremarkable.

Adrenals/Urinary Tract: Negative adrenals. Mild high-density
stranding around the lower pole left kidney where there is minimal
parenchymal high-density and gas, expected biopsy changes. No
measurable hematoma or cortical mass effect. Left renal artery
aneurysm known from prior CTA, calcification measuring 11 mm in
diameter.

Stomach/Bowel:  No obstruction. No evidence of bowel inflammation

Vascular/Lymphatic: No acute vascular abnormality. No mass or
adenopathy.

Reproductive:Lobulated enlarged uterus from fibroids which are
difficult to discretely visualize by noncontrast CT

Other: No ascites or pneumoperitoneum. Fatty wide necked umbilical
hernia.

Musculoskeletal: Degenerative changes without acute finding
IMPRESSION: 1. Small volume parenchymal and perinephric hemorrhage at the left
renal biopsy site which is commonly seen.
2. No acute intrathoracic finding.
3. Small volume pericardial effusion not seen [DATE].

## 2019-02-05 IMAGING — CR DG CHEST 2V
2 series · 2 of 2 positions shown · non-contrast
Comparison: Yesterday

CLINICAL DATA: Shortness of breath

EXAM:
CHEST - 2 VIEW

[chest lat]
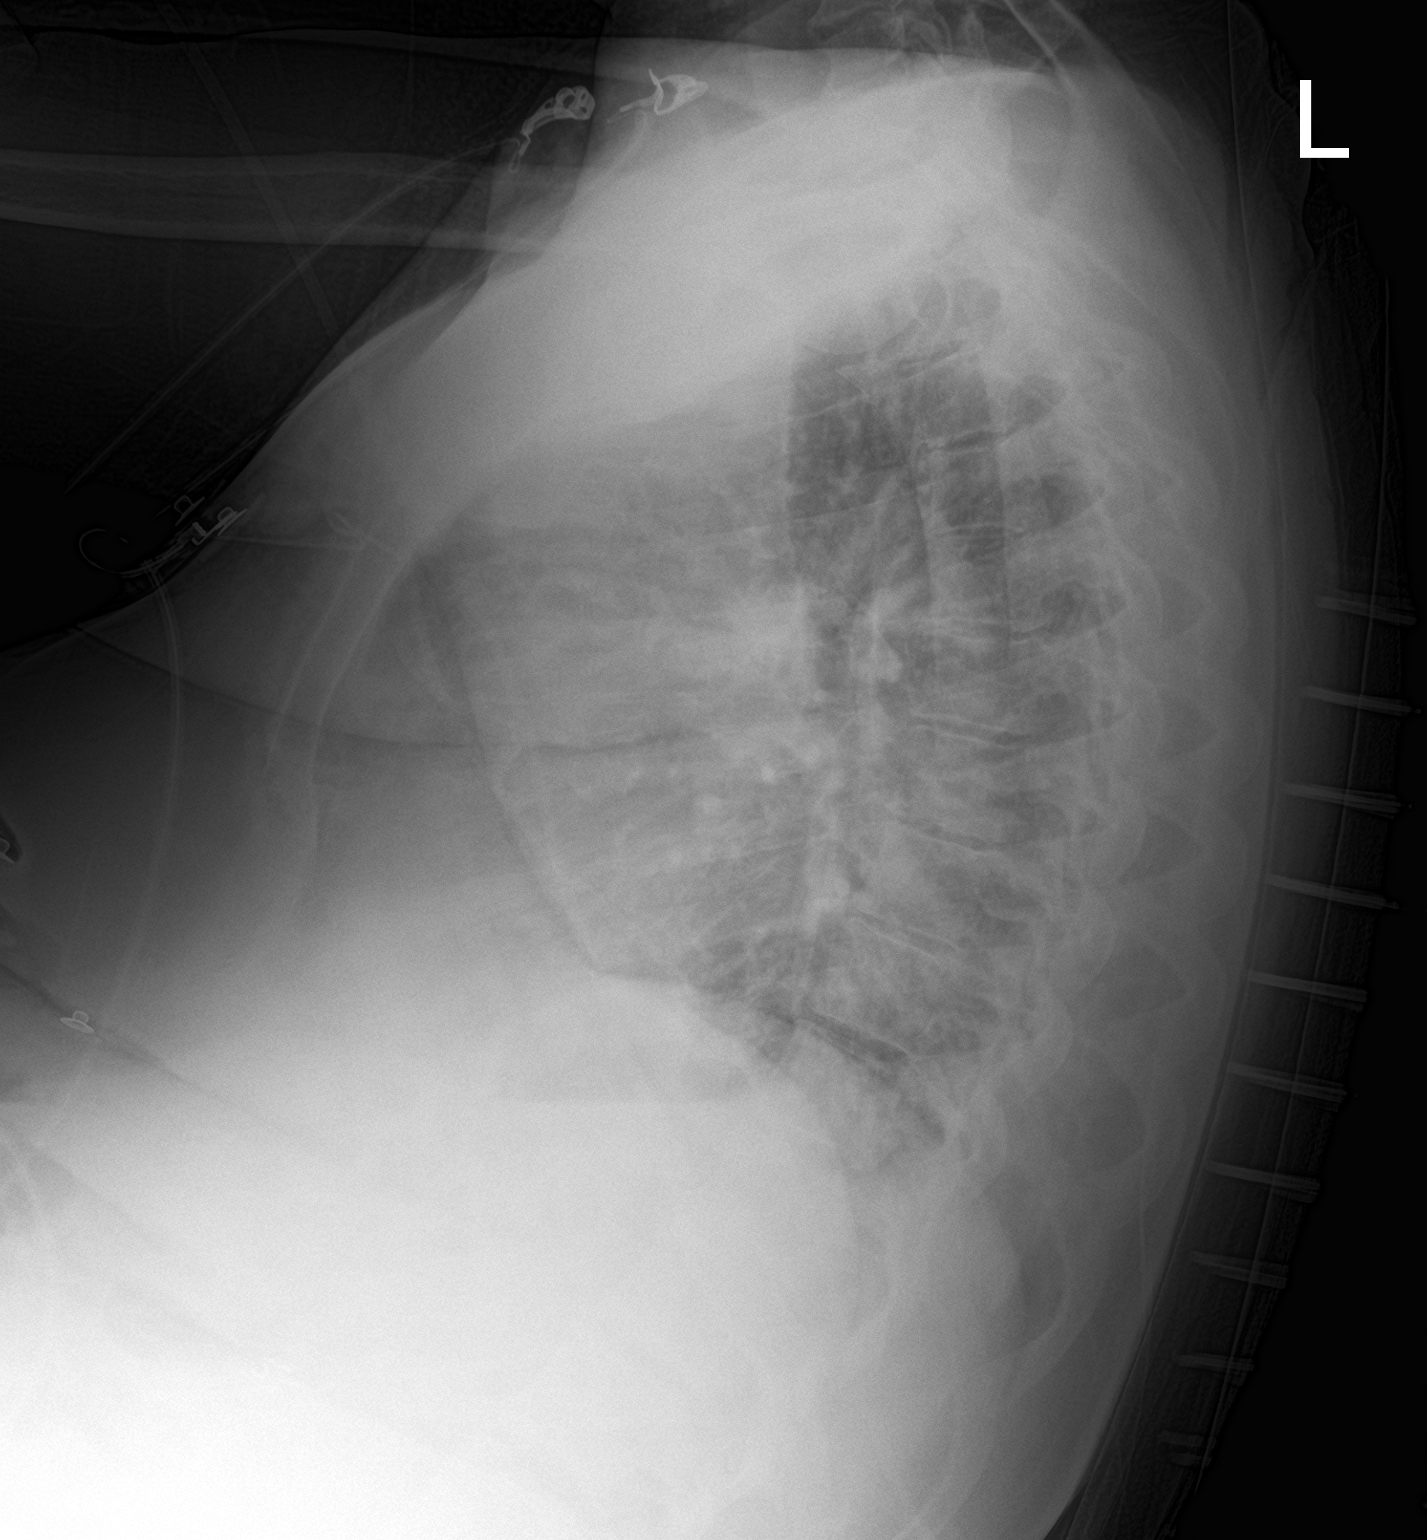

[chest ap]
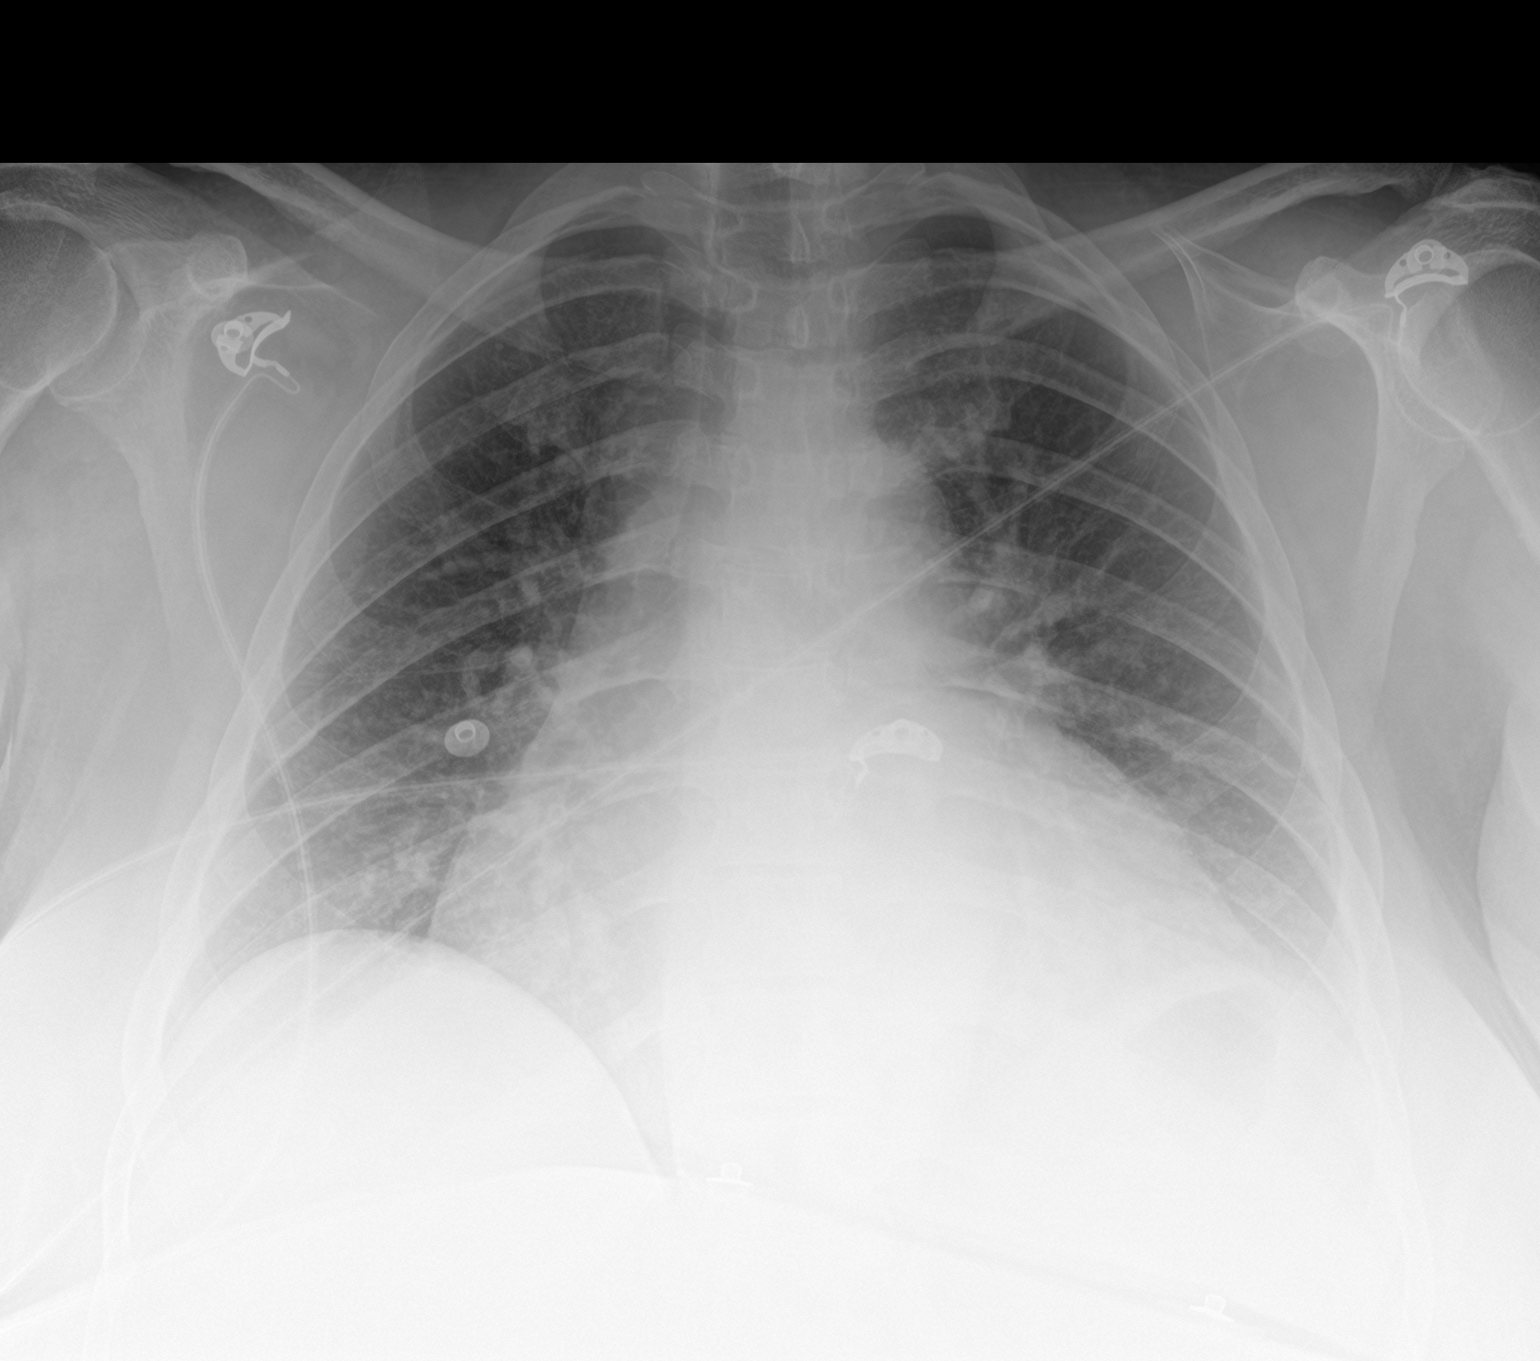

[2 of 2 positions shown; findings below may reference images not displayed]

FINDINGS: Cardiomegaly. Low lung volumes. There is no edema, consolidation,
effusion, or pneumothorax.
IMPRESSION: 1. No acute finding.
2. Chronic cardiomegaly

## 2019-02-05 MED ORDER — CLONIDINE HCL 0.2 MG PO TABS
0.2000 mg | ORAL_TABLET | Freq: Three times a day (TID) | ORAL | Status: DC
  Administered 2019-02-05 – 2019-02-18 (×36): 0.2 mg via ORAL
  Filled 2019-02-05 (×39): qty 1

## 2019-02-05 MED ORDER — CARVEDILOL 25 MG PO TABS
25.0000 mg | ORAL_TABLET | Freq: Two times a day (BID) | ORAL | Status: DC
  Administered 2019-02-05 – 2019-02-18 (×24): 25 mg via ORAL
  Filled 2019-02-05 (×25): qty 1

## 2019-02-05 MED ORDER — NICARDIPINE HCL IN NACL 20-0.86 MG/200ML-% IV SOLN
3.0000 mg/h | INTRAVENOUS | Status: DC
  Administered 2019-02-05 (×2): 15 mg/h via INTRAVENOUS
  Administered 2019-02-05 (×3): 5 mg/h via INTRAVENOUS
  Filled 2019-02-05 (×6): qty 200

## 2019-02-05 MED ORDER — SODIUM CHLORIDE 0.9 % IV BOLUS
1000.0000 mL | Freq: Once | INTRAVENOUS | Status: AC
  Administered 2019-02-05: 01:00:00 1000 mL via INTRAVENOUS

## 2019-02-05 MED ORDER — HYDRALAZINE HCL 20 MG/ML IJ SOLN
5.0000 mg | Freq: Once | INTRAMUSCULAR | Status: AC
  Administered 2019-02-05: 5 mg via INTRAVENOUS
  Filled 2019-02-05: qty 1

## 2019-02-05 MED ORDER — INSULIN ASPART 100 UNIT/ML ~~LOC~~ SOLN
0.0000 [IU] | SUBCUTANEOUS | Status: DC
  Administered 2019-02-05 (×2): 3 [IU] via SUBCUTANEOUS
  Administered 2019-02-06 (×2): 2 [IU] via SUBCUTANEOUS
  Administered 2019-02-07: 3 [IU] via SUBCUTANEOUS
  Administered 2019-02-07 – 2019-02-11 (×5): 2 [IU] via SUBCUTANEOUS
  Administered 2019-02-12 – 2019-02-13 (×2): 3 [IU] via SUBCUTANEOUS
  Administered 2019-02-13 – 2019-02-14 (×2): 2 [IU] via SUBCUTANEOUS
  Administered 2019-02-14: 12:00:00 3 [IU] via SUBCUTANEOUS
  Administered 2019-02-14: 5 [IU] via SUBCUTANEOUS
  Administered 2019-02-15 (×2): 2 [IU] via SUBCUTANEOUS
  Administered 2019-02-16: 17:00:00 3 [IU] via SUBCUTANEOUS
  Administered 2019-02-16: 2 [IU] via SUBCUTANEOUS
  Administered 2019-02-16 – 2019-02-18 (×2): 5 [IU] via SUBCUTANEOUS

## 2019-02-05 MED ORDER — HEPARIN SODIUM (PORCINE) 5000 UNIT/ML IJ SOLN
5000.0000 [IU] | Freq: Three times a day (TID) | INTRAMUSCULAR | Status: DC
  Administered 2019-02-05 – 2019-02-08 (×9): 5000 [IU] via SUBCUTANEOUS
  Filled 2019-02-05 (×9): qty 1

## 2019-02-05 MED ORDER — SODIUM CHLORIDE 0.9 % IV SOLN
2.0000 g | Freq: Once | INTRAVENOUS | Status: AC
  Administered 2019-02-05: 01:00:00 2 g via INTRAVENOUS
  Filled 2019-02-05: qty 2

## 2019-02-05 MED ORDER — VANCOMYCIN VARIABLE DOSE PER UNSTABLE RENAL FUNCTION (PHARMACIST DOSING): Status: DC

## 2019-02-05 MED ORDER — ONDANSETRON HCL 4 MG/2ML IJ SOLN: 4.0000 mg | Freq: Four times a day (QID) | INTRAMUSCULAR | Status: DC | PRN

## 2019-02-05 MED ORDER — PROMETHAZINE HCL 25 MG/ML IJ SOLN
25.0000 mg | Freq: Once | INTRAMUSCULAR | Status: AC
  Administered 2019-02-05: 25 mg via INTRAVENOUS
  Filled 2019-02-05: qty 1

## 2019-02-05 MED ORDER — VANCOMYCIN HCL 10 G IV SOLR
2500.0000 mg | Freq: Once | INTRAVENOUS | Status: AC
  Administered 2019-02-05: 02:00:00 2500 mg via INTRAVENOUS
  Filled 2019-02-05: qty 2500

## 2019-02-05 MED ORDER — HYDRALAZINE HCL 20 MG/ML IJ SOLN
10.0000 mg | Freq: Once | INTRAMUSCULAR | Status: AC
  Administered 2019-02-05: 02:00:00 10 mg via INTRAVENOUS
  Filled 2019-02-05: qty 1

## 2019-02-05 MED ORDER — CHLORHEXIDINE GLUCONATE CLOTH 2 % EX PADS
6.0000 | MEDICATED_PAD | Freq: Every day | CUTANEOUS | Status: DC
  Administered 2019-02-05 – 2019-02-18 (×12): 6 via TOPICAL

## 2019-02-05 MED ORDER — SODIUM CHLORIDE 0.9 % IV SOLN
1.0000 g | INTRAVENOUS | Status: DC
  Administered 2019-02-06: 1 g via INTRAVENOUS
  Filled 2019-02-05: qty 1

## 2019-02-05 MED ORDER — AMLODIPINE BESYLATE 10 MG PO TABS
10.0000 mg | ORAL_TABLET | Freq: Every day | ORAL | Status: DC
  Administered 2019-02-05 – 2019-02-18 (×12): 10 mg via ORAL
  Filled 2019-02-05: qty 2
  Filled 2019-02-05 (×13): qty 1

## 2019-02-05 MED ORDER — LABETALOL HCL 5 MG/ML IV SOLN
20.0000 mg | Freq: Once | INTRAVENOUS | Status: AC
  Administered 2019-02-05: 20 mg via INTRAVENOUS
  Filled 2019-02-05: qty 4

## 2019-02-05 MED ORDER — NITROGLYCERIN IN D5W 200-5 MCG/ML-% IV SOLN
0.0000 ug/min | INTRAVENOUS | Status: DC
  Administered 2019-02-05: 5 ug/min via INTRAVENOUS
  Filled 2019-02-05: qty 250

## 2019-02-05 NOTE — Consult Note (Signed)
Reason for Consult: Acute kidney injury on chronic kidney disease stage IV Referring Physician: Marshell Garfinkel MD (CCM)   HPI:  58 year old African-American woman with past medical history significant for hyperlipidemia, GERD, poorly controlled hypertension and diabetes mellitus with relatively rapidly progressive chronic kidney disease over the past several months for which a renal biopsy was undertaken on 02/03/2019.  Unfortunately, on the day after the procedure she developed nausea and vomiting limiting intake of her antihypertensive therapy and thereafter developed chest pain for which he presented to the emergency room.  Evaluation here showed hypertensive emergency with acute kidney injury on chronic kidney disease stage IV for which she is on nicardipine drip and has received 2 L of normal saline in the emergency room.  CT scan of the abdomen/pelvis showed a small left perinephric hematoma at the site of renal biopsy.   Per verbal report from Dr. 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  Past Medical History:  Diagnosis Date  . Diabetes mellitus   . GERD (gastroesophageal reflux disease)   . Hyperlipidemia   . Hypertension   . Nausea   . Renal disorder     Past Surgical History:  Procedure Laterality Date  . BREAST SURGERY     reduction  . CESAREAN SECTION     x2  . CHOLECYSTECTOMY     laparoscopic  . REDUCTION MAMMAPLASTY Bilateral     Family History  Problem Relation Age of Onset  . Hypertension Mother   . Diabetes Mother   . Cancer Father        lung  . Colon cancer Neg Hx   . Stomach cancer Neg Hx   . Esophageal cancer Neg Hx     Social History:  reports that she quit smoking about 7 years ago. Her smoking use included cigarettes. She quit after 10.00 years of use.  She has never used smokeless tobacco. She reports that she does not drink alcohol or use drugs.  Allergies:  Allergies  Allergen Reactions  . Lisinopril Anaphylaxis and Other (See Comments)    angioedema  . Jardiance [Empagliflozin] Rash    Medications:  Scheduled: . amLODipine  10 mg Oral Daily  . carvedilol  25 mg Oral BID WC  . Chlorhexidine Gluconate Cloth  6 each Topical Daily  . cloNIDine  0.2 mg Oral TID  . heparin  5,000 Units Subcutaneous Q8H  . insulin aspart  0-15 Units Subcutaneous Q4H  . vancomycin variable dose per unstable renal function (pharmacist dosing)   Does not apply See admin instructions    BMP Latest Ref Rng & Units 02/05/2019 02/04/2019 11/12/2018  Glucose 70 - 99 mg/dL - 169(H) 113(H)  BUN 6 - 20 mg/dL - 39(H) 34(H)  Creatinine 0.44 - 1.00 mg/dL - 5.07(H) 3.35(H)  BUN/Creat Ratio 9 - 23 - - 10  Sodium 135 - 145 mmol/L 144 141 145(H)  Potassium 3.5 - 5.1 mmol/L 3.8 3.7 5.0  Chloride 98 - 111 mmol/L - 109 107(H)  CO2 22 - 32 mmol/L - 19(L) 21  Calcium 8.9 - 10.3 mg/dL - 9.3 9.5   CBC Latest Ref Rng & Units 02/05/2019 02/04/2019 02/03/2019  WBC 4.0 - 10.5 K/uL - 17.9(H) 9.1  Hemoglobin 12.0 - 15.0 g/dL 11.6(L) 13.2 9.7(L)  Hematocrit 36.0 - 46.0 % 34.0(L) 40.2 30.2(L)  Platelets 150 - 400 K/uL - 371 287  Ct Abdomen Pelvis Wo Contrast  Result Date: 02/05/2019 CLINICAL DATA:  Nausea and vomiting. Kidney biopsy earlier today. Chest pain and marked hypertension EXAM: CT CHEST, ABDOMEN AND PELVIS WITHOUT CONTRAST TECHNIQUE: Multidetector CT imaging of the chest, abdomen and pelvis was performed following the standard protocol without IV contrast. COMPARISON:  Abdominal CT 08/15/2017 FINDINGS: CT CHEST FINDINGS Cardiovascular: Borderline heart size. Trace pericardial fluid or less likely thickening. Mild atherosclerotic calcification of the aorta. Mediastinum/Nodes: No adenopathy. Lungs/Pleura: There is no edema, consolidation, effusion, or pneumothorax.  Musculoskeletal: No acute or aggressive finding CT ABDOMEN PELVIS FINDINGS Hepatobiliary: No focal liver abnormality.Cholecystectomy. No bile duct dilatation Pancreas: Unremarkable. Spleen: Unremarkable. Adrenals/Urinary Tract: Negative adrenals. Mild high-density stranding around the lower pole left kidney where there is minimal parenchymal high-density and gas, expected biopsy changes. No measurable hematoma or cortical mass effect. 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 Stomach/Bowel:  No obstruction. No evidence of bowel inflammation Vascular/Lymphatic: No acute vascular abnormality. No mass or adenopathy. Reproductive:Lobulated enlarged uterus from fibroids which are difficult to discretely visualize by noncontrast CT Other: No ascites or pneumoperitoneum. Fatty wide necked umbilical hernia. Musculoskeletal: Degenerative changes without acute finding IMPRESSION: 1. Small volume parenchymal and perinephric hemorrhage at the left renal biopsy site which is commonly seen. 2. No acute intrathoracic finding. 3. Small volume pericardial effusion not seen August 15, 2017. Electronically Signed   By: Monte Fantasia M.D.   On: 02/05/2019 04:46   Dg Chest 2 View  Result Date: 02/05/2019 CLINICAL DATA:  Shortness of breath EXAM: CHEST - 2 VIEW COMPARISON:  Yesterday FINDINGS: Cardiomegaly. Low lung volumes. There is no edema, consolidation, effusion, or pneumothorax. IMPRESSION: 1. No acute finding. 2. Chronic cardiomegaly Electronically Signed   By: Monte Fantasia M.D.   On: 02/05/2019 04:29   Dg Chest 2 View  Result Date: 02/04/2019 CLINICAL DATA:  Chest pain EXAM: CHEST - 2 VIEW COMPARISON:  Radiograph 08/06/2015, CT 01/19/2012 FINDINGS: Lung volumes are diminished. Streaky basilar areas of atelectasis. Accounting for body habitus, the lungs are otherwise clear. There is cardiomegaly and abundant mediastinal fat, similar to priors. Cardiomediastinal contours  are otherwise unremarkable. Degenerative changes are present in the imaged spine and shoulders. IMPRESSION: Low lung volumes with bibasilar atelectasis. No other acute cardiopulmonary abnormality. Stable cardiomegaly. Electronically Signed   By: Lovena Le M.D.   On: 02/04/2019 22:48   Ct Chest Wo Contrast  Result Date: 02/05/2019 CLINICAL DATA:  Nausea and vomiting. Kidney biopsy earlier today. Chest pain and marked hypertension EXAM: CT CHEST, ABDOMEN AND PELVIS WITHOUT CONTRAST TECHNIQUE: Multidetector CT imaging of the chest, abdomen and pelvis was performed following the standard protocol without IV contrast. COMPARISON:  Abdominal CT 08/15/2017 FINDINGS: CT CHEST FINDINGS Cardiovascular: Borderline heart size. Trace pericardial fluid or less likely thickening. Mild atherosclerotic calcification of the aorta. Mediastinum/Nodes: No adenopathy. Lungs/Pleura: There is no edema, consolidation, effusion, or pneumothorax. Musculoskeletal: No acute or aggressive finding CT ABDOMEN PELVIS FINDINGS Hepatobiliary: No focal liver abnormality.Cholecystectomy. No bile duct dilatation Pancreas: Unremarkable. Spleen: Unremarkable. Adrenals/Urinary Tract: Negative adrenals. Mild high-density stranding around the lower pole left kidney where there is minimal parenchymal high-density and gas, expected biopsy changes. No measurable hematoma or cortical mass effect. 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 Stomach/Bowel:  No obstruction. No evidence of bowel inflammation Vascular/Lymphatic: No acute vascular abnormality. No mass or adenopathy. Reproductive:Lobulated enlarged uterus from fibroids which are difficult to discretely visualize by noncontrast  CT Other: No ascites or pneumoperitoneum. Fatty wide necked umbilical hernia. Musculoskeletal: Degenerative changes without acute finding IMPRESSION: 1. Small volume parenchymal and perinephric hemorrhage at the left renal  biopsy site which is commonly seen. 2. No acute intrathoracic finding. 3. Small volume pericardial effusion not seen August 15, 2017. Electronically Signed   By: Monte Fantasia M.D.   On: 02/05/2019 04:46    Review of Systems  Constitutional: Positive for malaise/fatigue. Negative for chills and fever.  HENT: Negative.   Eyes: Negative.   Respiratory: Negative.   Cardiovascular: Positive for chest pain and leg swelling. Negative for palpitations, orthopnea and claudication.       Transient chest pain yesterday  Gastrointestinal: Positive for abdominal pain, nausea and vomiting. Negative for blood in stool and heartburn.  Genitourinary: Negative.   Musculoskeletal: Negative.   Skin: Negative.   Neurological: Positive for headaches. Negative for dizziness and focal weakness.   Blood pressure (!) 183/73, pulse (!) 106, temperature 98.9 F (37.2 C), temperature source Rectal, resp. rate (!) 26, SpO2 99 %. Physical Exam  Nursing note and vitals reviewed. Constitutional: She is oriented to person, place, and time. She appears well-developed and well-nourished. No distress.  HENT:  Head: Normocephalic and atraumatic.  Mouth/Throat: Oropharynx is clear and moist. No oropharyngeal exudate.  Eyes: Pupils are equal, round, and reactive to light. Conjunctivae and EOM are normal. No scleral icterus.  Neck: Normal range of motion. Neck supple. JVD present.  9 to 10 cm JVP  Cardiovascular: Regular rhythm and normal heart sounds.  No murmur heard. Regular tachycardia  Respiratory: Effort normal. She has rales.  Diminished breath sounds over bases with fine rales audible left side  GI: Soft. Bowel sounds are normal. There is no abdominal tenderness. There is no rebound and no guarding.  Musculoskeletal:        General: Edema present.     Comments: 4+ bilateral pitting lower extremity edema, 2+ upper extremity edema  Neurological: She is alert and oriented to person, place, and time.  Skin: Skin  is warm and dry. No rash noted.  Psychiatric: She has a normal mood and affect. Her behavior is normal.    Assessment/Plan: 1.  Acute kidney injury on chronic kidney disease stage IV: This appears to be hemodynamically mediated with hypertensive emergency/volume contraction from GI loss.  She is status post intravenous fluids and is ongoing management of her hypertensive emergency on nicardipine drip.  Will continue to follow renal function/urine output closely to decide on need to augment antihypertensive therapy with diuresis given significant volume excess seen on exam.  Recent renal biopsy shows significant renal injury and fibrosis from chronic hypertension and diabetes. 2.  Hypertensive emergency: With acute kidney injury and ongoing hypertension management with nicardipine drip.  Will transition to oral antihypertensive therapy as permitted. 3.  Status post renal biopsy with left perinephric hematoma: Hemoglobin/hematocrit appear to be relatively stable (possibly dilutional following intravenous fluids).  We will continue to trend hemoglobin as accelerated hypertension can cause worsening of the perinephric bleed. 4.  Diabetes mellitus: Hold/discontinue metformin indefinitely with her current GFR.  SSI at this time with precautions against hypoglycemia in the setting of AKI. 5.  Leukocytosis: Without fever or other focal infection.  Suspect demargination from acute nausea and vomiting.  On broad-spectrum antimicrobial coverage per CCM.  Abijah Roussel K. 02/05/2019, 12:09 PM

## 2019-02-05 NOTE — Progress Notes (Signed)
  Echocardiogram 2D Echocardiogram has been performed.  [name] 02/05/2019, 10:12 AM

## 2019-02-05 NOTE — H&P (Addendum)
NAME:  [name], MRN:  [id number], DOB:  30-Nov-1960, LOS: 0 ADMISSION DATE:  02/04/2019, CONSULTATION DATE: 02/05/2019 REFERRING MD: EDP, CHIEF COMPLAINT: Hypertensive emergency  Brief History   58 Y/O with type 2 diabetes on metformin, GERD, hyperlipidemia, hypertension,CKD. Status post kidney biopsy on 10/30 for evaluation of chronic kidney disease.  Developed nausea, emesis after procedure.  She has not been taking her BP medications since day before biopsy. Came in with persistent emesis, chest pain.  Found to have hypertensive emergency.  Started on nicardipine drip.  Given 2 L of IV fluid.  PCCM consulted for further admission to ICU.                        Past Medical History  Diabetes, GERD, hyperlipidemia, hypertension, chronic kidney disease  Significant Hospital Events   10/2- admit  Consults:  PCCM, nephrology  Procedures:    Significant Diagnostic Tests:  CT chest abdomen pelvis 10/2- small volume perinephric hemorrhage in the left biopsy site.  No acute intrathoracic finding.  Small volume pericardial effusion.  I have reviewed the images personally.  Micro Data:  Blood cultures 10/2  Antimicrobials:  Vanco, cefepime  Interim history/subjective:    Objective   Blood pressure (!) 184/69, pulse (!) 111, temperature 98.9 F (37.2 C), temperature source Rectal, resp. rate (!) 6, SpO2 100 %.       No intake or output data in the 24 hours ending 02/05/19 0856 There were no vitals filed for this visit.  Examination: Blood pressure (!) 184/69, pulse (!) 111, temperature 98.9 F (37.2 C), temperature source Rectal, resp. rate (!) 6, SpO2 100 %. Gen:      No acute distress HEENT:  EOMI, sclera anicteric Neck:     No masses; no thyromegaly Lungs:    Clear to auscultation bilaterally; normal respiratory effort CV:         Regular rate and rhythm; no murmurs Abd:      + bowel sounds; soft, non-tender; no palpable masses, no distension Ext:    2-3+ UE and LE  edema; adequate peripheral perfusion Skin:      Warm and dry; no rash Neuro: alert and oriented x 3 Psych: normal mood and affect  Resolved Hospital Problem list     Assessment & Plan:  58 year old with chronic kidney disease presenting with hypertensive emergency, emesis after recent kidney biopsy.  Hypertensive emergency  Has not been taking BP meds for past 3 days due to emesis Continue nicardipine drip.  Restart home p.o. meds Zofran PRN for nausea  Chronic kidney disease Elevated creatinine with volume overload Awaiting results of kidney biopsy We will consult nephrology  Small pericardial effusion, elevated troponin, BNP. Findings likely secondary to uremia, hypertensive emergency. No evidence of ischemia on EKG.  Will trend troponin Check echocardiogram  Diabetes mellitus  Hold metformin.  SSI coverage  Leukocytosis Has been started empirically on broad antibiotic coverage Low suspicion for infection.  Low threshold to DC if cultures are negative.  Best practice:  Diet: Clear Pain/Anxiety/Delirium protocol (if indicated): NA VAP protocol (if indicated): NA DVT prophylaxis: Heparin SQ GI prophylaxis: NA Glucose control: SSI Mobility: Bed Code Status: Full Family Communication: Patient and daughter updated Disposition: ICU  Labs   CBC: Recent Labs  Lab 02/03/19 0703 02/04/19 2205 02/05/19 0306  WBC 9.1 17.9*  --   HGB 9.7* 13.2 11.6*  HCT 30.2* 40.2 34.0*  MCV 84.4 81.9  --   PLT  287 371  --     Basic Metabolic Panel: Recent Labs  Lab 02/04/19 2205 02/05/19 0306  NA 141 144  K 3.7 3.8  CL 109  --   CO2 19*  --   GLUCOSE 169*  --   BUN 39*  --   CREATININE 5.07*  --   CALCIUM 9.3  --    GFR: Estimated Creatinine Clearance: 16.8 mL/min (A) (by C-G formula based on SCr of 5.07 mg/dL (H)). Recent Labs  Lab 02/03/19 0703 02/04/19 2205 02/05/19 0133  WBC 9.1 17.9*  --   LATICACIDVEN  --   --  0.8    Liver Function Tests: Recent Labs   Lab 02/05/19 0043  AST 22  ALT 21  ALKPHOS 141*  BILITOT 0.8  PROT 7.7  ALBUMIN 2.8*   No results for input(s): LIPASE, AMYLASE in the last 168 hours. No results for input(s): AMMONIA in the last 168 hours.  ABG    Component Value Date/Time   PHART 7.379 02/05/2019 0306   PCO2ART 30.0 (L) 02/05/2019 0306   PO2ART 74.0 (L) 02/05/2019 0306   HCO3 17.7 (L) 02/05/2019 0306   TCO2 19 (L) 02/05/2019 0306   ACIDBASEDEF 6.0 (H) 02/05/2019 0306   O2SAT 95.0 02/05/2019 0306     Coagulation Profile: Recent Labs  Lab 02/03/19 0703  INR 1.1    Cardiac Enzymes: No results for input(s): CKTOTAL, CKMB, CKMBINDEX, TROPONINI in the last 168 hours.  HbA1C: Hemoglobin A1C  Date/Time Value Ref Range Status  08/06/2017 01:53 PM 8.9  Final  05/14/2017 03:20 PM 11.0  Final   HbA1c, POC (controlled diabetic range)  Date/Time Value Ref Range Status  11/12/2018 08:50 AM 6.8 0.0 - 7.0 % Final   Hgb A1c MFr Bld  Date/Time Value Ref Range Status  08/07/2015 04:06 AM 10.4 (H) 4.8 - 5.6 % Final    Comment:    (NOTE)         Pre-diabetes: 5.7 - 6.4         Diabetes: >6.4         Glycemic control for adults with diabetes: <7.0   01/19/2012 02:13 PM 9.7 (H) <5.7 % Final    Comment:    (NOTE)                                                                       According to the ADA Clinical Practice Recommendations for 2011, when HbA1c is used as a screening test:  >=6.5%   Diagnostic of Diabetes Mellitus           (if abnormal result is confirmed) 5.7-6.4%   Increased risk of developing Diabetes Mellitus References:Diagnosis and Classification of Diabetes Mellitus,Diabetes AVWU,9811,91(YNWGN 1):S62-S69 and Standards of Medical Care in         Diabetes - 2011,Diabetes Care,2011,34 (Suppl 1):S11-S61.    CBG: Recent Labs  Lab 02/03/19 0617  GLUCAP 131*    Review of Systems:   REVIEW OF SYSTEMS:   All negative; except for those that are bolded, which indicate positives.   Constitutional: weight loss, weight gain, night sweats, fevers, chills, fatigue, weakness.  HEENT: headaches, sore throat, sneezing, nasal congestion, post nasal drip, difficulty swallowing, tooth/dental problems, visual complaints, visual changes, ear  aches. Neuro: difficulty with speech, weakness, numbness, ataxia. CV:  chest pain, orthopnea, PND, swelling in lower extremities, dizziness, palpitations, syncope.  Resp: cough, hemoptysis, dyspnea, wheezing. GI: heartburn, indigestion, abdominal pain, nausea, vomiting, diarrhea, constipation, change in bowel habits, loss of appetite, hematemesis, melena, hematochezia.  GU: dysuria, change in color of urine, urgency or frequency, flank pain, hematuria. MSK: joint pain or swelling, decreased range of motion. Psych: change in mood or affect, depression, anxiety, suicidal ideations, homicidal ideations. Skin: rash, itching, bruising.  Past Medical History  She,  has a past medical history of Diabetes mellitus, GERD (gastroesophageal reflux disease), Hyperlipidemia, Hypertension, Nausea, and Renal disorder.   Surgical History    Past Surgical History:  Procedure Laterality Date  . BREAST SURGERY     reduction  . CESAREAN SECTION     x2  . CHOLECYSTECTOMY     laparoscopic  . REDUCTION MAMMAPLASTY Bilateral      Social History   reports that she quit smoking about 7 years ago. Her smoking use included cigarettes. She quit after 10.00 years of use. She has never used smokeless tobacco. She reports that she does not drink alcohol or use drugs.   Family History   Her family history includes Cancer in her father; Diabetes in her mother; Hypertension in her mother. There is no history of Colon cancer, Stomach cancer, or Esophageal cancer.   Allergies Allergies  Allergen Reactions  . Lisinopril Anaphylaxis and Other (See Comments)    angioedema  . Jardiance [Empagliflozin] Rash     Home Medications  Prior to Admission medications    Medication Sig Start Date End Date Taking? Authorizing Provider  acetaminophen (TYLENOL) 500 MG tablet Take 1,000 mg by mouth every 6 (six) hours as needed for moderate pain or headache.   Yes [provider]  amLODipine (NORVASC) 10 MG tablet TAKE 1 TABLET BY MOUTH ONCE DAILY Patient taking differently: Take 10 mg by mouth daily.  01/06/18  Yes Diallo, Abdoulaye, MD  aspirin EC 325 MG tablet Take 325 mg by mouth daily.   Yes [provider]  atorvastatin (LIPITOR) 20 MG tablet TAKE 1 TABLET BY MOUTH ONCE DAILY AT  6PM Patient taking differently: Take 20 mg by mouth daily at 6 PM.  09/16/17  Yes [name], MD  carvedilol (COREG) 25 MG tablet Take 1 tablet (25 mg total) by mouth 2 (two) times daily with a meal. 07/01/18  Yes Diallo, Abdoulaye, MD  cloNIDine (CATAPRES) 0.2 MG tablet Take 0.2 mg by mouth 3 (three) times daily.   Yes [provider]  ferrous sulfate 325 (65 FE) MG tablet Take 325 mg by mouth daily with breakfast.   Yes [provider]  furosemide (LASIX) 40 MG tablet Take 40 mg by mouth 2 (two) times daily.   Yes [provider]  polyethylene glycol (MIRALAX / GLYCOLAX) packet Take 17 g by mouth daily as needed for moderate constipation. 08/06/17  Yes [name], MD  potassium chloride SA (K-DUR) 20 MEQ tablet Take 20 mEq by mouth daily.   Yes [provider]  traMADol (ULTRAM) 50 MG tablet TAKE 1 TABLET BY MOUTH EVERY 8 HOURS AS NEEDED Patient taking differently: Take 50 mg by mouth 2 (two) times daily as needed for moderate pain.  02/17/18  Yes Diallo, Abdoulaye, MD  Vitamin D, Ergocalciferol, (DRISDOL) 1.25 MG (50000 UT) CAPS capsule Take 50,000 Units by mouth every Monday.    Yes [provider]  Accu-Chek Softclix Lancets lancets  Use as instructed 09/29/18   Diallo, Earna Coder, MD  Blood Glucose Monitoring Suppl (ACCU-CHEK AVIVA PLUS) w/Device KIT Use to check sugar three times a day 05/14/17   [name], MD  glucose blood (ACCU-CHEK AVIVA PLUS) test strip Use as instructed 09/29/18   Diallo, Earna Coder, MD  Lancets (ACCU-CHEK SOFT TOUCH) lancets Use to check sugars three times a day 09/24/18   Marjie Skiff, MD     Critical care time: 48    The patient is critically ill with multiple organ system failure and requires high complexity decision making for assessment and support, frequent evaluation and titration of therapies, advanced monitoring, review of radiographic studies and interpretation of complex data.   Critical Care Time devoted to patient care services, exclusive of separately billable procedures, described in this note is 35 minutes.   Marshell Garfinkel MD [facility] Pager [phone] If no answer call [phone] 02/05/2019, 9:29 AM

## 2019-02-05 NOTE — Progress Notes (Signed)
Pharmacy Antibiotic Note  [name] is a 58 y.o. female admitted on 02/04/2019 with bacteremia.  Pharmacy has been consulted for cefepime and vancomycin dosing.  Plan: Vancomycin 2500 mg IV x 1 Cefepime 2gm IV x 1 then 1gm IV q24 hours F/u random vancomycin level for redose F/u clinical course and cultures    Temp (24hrs), Avg:98.8 F (37.1 C), Min:98.5 F (36.9 C), Max:99.1 F (37.3 C)  Recent Labs  Lab 02/03/19 0703 02/04/19 2205  WBC 9.1 17.9*  CREATININE  --  5.07*    Estimated Creatinine Clearance: 16.8 mL/min (A) (by C-G formula based on SCr of 5.07 mg/dL (H)).    Allergies  Allergen Reactions  . Lisinopril Anaphylaxis and Other (See Comments)    angioedema  . Jardiance [Empagliflozin] Rash     Thank you for allowing pharmacy to be a part of this patient's care.  Excell Seltzer Poteet 02/05/2019 12:55 AM

## 2019-02-05 NOTE — ED Notes (Signed)
Attempted report 

## 2019-02-05 NOTE — Progress Notes (Signed)
Upper extremity venous has been completed.   Preliminary results in CV Proc.   Abram Sander 02/05/2019 3:41 PM

## 2019-02-05 NOTE — ED Notes (Signed)
Pt informed of visitor policy

## 2019-02-05 NOTE — ED Provider Notes (Signed)
58 year old female received at sign out from [place] pending   "[name] is a 58 y.o. female with a past medical history significant for type 2 diabetes on metformin, GERD, hyperlipidemia, hypertension, CKD presents to emergency department today with chief complaint of chest pain and emesis.   Pt had an ultrasound guided random renal biopsy yesterday with Dr. Earleen Newport in an effort to evaluate her worsening kidney function.  Results are not yet available from the biopsy.  Patient's daughter is at the bedside and is contributing historian.  She states the biopsy was done outpatient and patient was sent home in stable condition.  Several hours later while at home patient began to feel nauseous.  She had multiple episodes of emesis.  She called the nephrologist who recommended she try drinking Gatorade.  States that did not improve and she continued to have vomiting.  She estimates at least 15-20 episodes of nonbloody nonbilious emesis in the last 24 hours.  Patient reports generalized abdominal pain.  She describes it out of her abdominal muscles feel sore because she has had multiple bouts of emesis. Patient states while in the ambulance on the way to the hospital she started to feel chest pain.  Pain is located in the left side of her chest.  It does not radiate.  She describes it as sharp sensation and she rates the pain 7 out of 10 in severity.  Patient was noted to be hypertensive for EMS to 989 systolic.  Patient states she has not taken her hypertension medication today because she has been vomiting.  She notes her blood pressure always improves after her meds.  Chart review shows patient was seen in the clinic x3 months ago and changes were made to her hypertension medications including discontinuing HCTZ.  It was recommended that she continue clonidine, spironolactone, carvedilol, amlodipine."  Physical Exam  BP (!) 184/69   Pulse (!) 111   Temp 98.9 F (37.2 C) (Rectal)   Resp (!) 6   SpO2  100%   Physical Exam Vitals signs and nursing note reviewed.  Constitutional:      General: She is not in acute distress.    Appearance: She is not ill-appearing, toxic-appearing or diaphoretic.     Comments: Morbidly obese.  Ill-appearing.   HENT:     Head: Normocephalic.     Mouth/Throat:     Mouth: Mucous membranes are dry.  Eyes:     Conjunctiva/sclera: Conjunctivae normal.  Neck:     Musculoskeletal: Neck supple.  Cardiovascular:     Rate and Rhythm: Regular rhythm. Tachycardia present.     Heart sounds: No murmur. No friction rub. No gallop.   Pulmonary:     Effort: No respiratory distress.     Breath sounds: Normal breath sounds. No stridor. No wheezing, rhonchi or rales.     Comments: Tachypneic.  Lungs are clear to auscultation bilaterally.  No retractions or accessory muscle use. Abdominal:     General: There is no distension.     Palpations: Abdomen is soft. There is no mass.     Tenderness: There is no abdominal tenderness. There is no right CVA tenderness, left CVA tenderness, guarding or rebound.     Hernia: No hernia is present.  Musculoskeletal:     Right lower leg: Edema present.     Left lower leg: Edema present.     Comments: Band-Aid in place over the left flank.  No bleeding or bruising.  No surrounding redness, warmth,  or fluctuance.  No tenderness to palpation near the biopsy site.  3+ pitting edema to the bilateral lower extremities  Skin:    General: Skin is warm.     Capillary Refill: Capillary refill takes 2 to 3 seconds.     Findings: No rash.  Neurological:     Mental Status: She is alert.  Psychiatric:        Behavior: Behavior normal.     ED Course/Procedures   Clinical Course as of Feb 04 758  Fri Feb 05, 2019  0030 Patient examined with Dr. Wyvonnia Dusky at beside.  Systolic blood pressure is 210s.  She is tachypneic with shallow, rapid breathing.  Lungs are clear to auscultation bilaterally.  Rectal temp is 98.9.  However, the patient is  warm to the touch.  Will add on lactate, blood cultures, and empirically start the patient on broad-spectrum antibiotics, vancomycin and cefepime.  She does have a leukocytosis of 18, which could be questionable for sepsis versus hemoconcentration.   [MM]  [phone] Patient recheck.  Despite receiving labetalol and hydralazine, she remains markedly hypertensive with a systolic blood pressure in the 210s.  Down from maximum systolic blood pressure of 240 in the ER.  She reports that chest pain has resolved, but on repeat pulmonary exam she has crackles in the bilateral bases and 3+ pitting edema in the bilateral lower extremities.  The patient was also seen and independently by Dr. Wyvonnia Dusky, attending physician.  ABG has been ordered.  We will ordered stat COVID test as patient may require BiPAP as long as she has no further episodes of vomiting.  Will order noncontrasted scans of the chest, abdomen, and pelvis.  We will start the patient on a nitroglycerin drip for blood pressure control and plan for admission.   [MM]  [phone] Spoke with Dr. Joseph Art, critical care, regarding the patient's need for nifedipine infusion. Critical care will come to assess the patient.    [MM]    Clinical Course User Index [MM] McDonald, Mia A, PA-C    .Critical Care Performed by: Joanne Gavel, PA-C Authorized by: Joanne Gavel, PA-C   Critical care provider statement:    Critical care time (minutes):  65   Critical care time was exclusive of:  Separately billable procedures and treating other patients and teaching time   Critical care was necessary to treat or prevent imminent or life-threatening deterioration of the following conditions:  Circulatory failure   Critical care was time spent personally by me on the following activities:  Development of treatment plan with patient or surrogate, discussions with consultants, obtaining history from patient or surrogate, examination of patient, evaluation of patient's response  to treatment, ordering and performing treatments and interventions, ordering and review of laboratory studies, ordering and review of radiographic studies, pulse oximetry, re-evaluation of patient's condition and review of old charts    MDM    58 year old female with a past medical history significant for type 2 diabetes on metformin, GERD, hyperlipidemia, hypertension, CKD received at signout from [place].  In brief, the patient underwent a renal biopsy on 9/30 and subsequently developed severe nausea and vomiting and has been unable to take her home medications since yesterday morning.  She developed left-sided chest pain in route.  Patient's renal function 1.64 4/20 -> 3.15 7/20 -> 5.07 today.   On my exam, she is markedly hypertensive, 222/81 with tachycardia and tachypnea.  She is not hypoxic.  She is afebrile, but is warm to the  touch.   CBC appears markedly hemoconcentrated.  Hemoglobin yesterday was 9.7 and is 13.2 today.  I suspect that a large component of the leukocytosis of 18 is secondary to hemoconcentration, but given that the patient is warm to the touch with recent procedure, lactate and blood cultures were obtained and the patient was started on vancomycin and cefepime.  Lactate was normal.  UA with moderate hemoglobinuria, unchanged from previous.  UA does not appear infectious.  Minimally changed from previous urinalysis.  Initial chest x-ray with low lung volumes with bibasilar atelectasis, but no other acute cardiopulmonary abnormality.  At signout, plan was to fluid resuscitate and manage nausea and vomiting with antiemetics and give the patient her home medications.  Chest pain had resolved with morphine.  However, patient was ill appearing.  The patient was seen and evaluated with Dr. Wyvonnia Dusky, attending physician who is actively involved with the work-up and plan and independently evaluated the patient on multiple occasions.  After the patient received a second liter  of fluids, she had 3+ pitting edema on the bilateral lower extremities.  BNP was obtained.  Differential diagnosis included hypertensive emergency since the patient's initial troponin was 34 and second troponin 47 with a delta of 13.  EKG with sinus tachycardia.    Delta troponin change could be secondary to demand is given elevated blood pressure and persistent tachycardia.  She continues to be chest pain-free.  She denies shortness of breath, but continues to appear tachypneic.  ABG obtained with pH of 7.37. Hemoglobin improved to 11.6.   COVID-19 is pending.  BNP is 1269.  Tachypnea and tachycardia could be secondary to fluid overload given fluid resuscitation in the setting of nausea and vomiting with worsening creatinine.  Also considered dissection given recent procedure with persistent hypertension in the setting of chest pain.  However, chest pain has resolved.  Mediastinum does appear widened on chest x-ray, but unchanged from previous.  Given worsening kidney function, noncontrasted CT of the chest abdomen pelvis with small volume parenchymal and perinephric hemorrhage commonly seen with renal biopsy site.  There is a small volume pericardial effusion. Dr. Wyvonnia Dusky called Dr. Pascal Lux with radiology to discuss results and aortic dissection was thought to be low risk.  Suspect tachypnea secondary to volume overload with BNP of 1269 and pitting edema despite not seeing pulmonary vascular congestion on repeat chest x-ray.   In the ER, she remained markedly hypertensive.  Max systolic blood pressure was 240.  She was given multiple doses of hydralazine, labetalol, and was started on IV nitroglycerin drip without improvement in her blood pressure.  She was then started on a nicardipine drip with blood pressure downtrending to 888 systolic. Spoke with Dr. Joseph Art, remote critical care intensivist.  Critical care will come to evaluate the patient. Dr. Tyrone Nine, ER physician, has been made aware of the patient  and that critical care will be evaluating the patient with likely admissions as the patient is on a nicardipine drip.      Joline Maxcy A, PA-C 02/05/19 0759    Ezequiel Essex, MD 02/06/19 [phone]

## 2019-02-06 DIAGNOSIS — D62 Acute posthemorrhagic anemia: Secondary | ICD-10-CM

## 2019-02-06 DIAGNOSIS — I161 Hypertensive emergency: Principal | ICD-10-CM

## 2019-02-06 DIAGNOSIS — N189 Chronic kidney disease, unspecified: Secondary | ICD-10-CM

## 2019-02-06 LAB — RENAL FUNCTION PANEL
Albumin: 2.2 g/dL — ABNORMAL LOW (ref 3.5–5.0)
Anion gap: 10 (ref 5–15)
BUN: 52 mg/dL — ABNORMAL HIGH (ref 6–20)
CO2: 18 mmol/L — ABNORMAL LOW (ref 22–32)
Calcium: 8 mg/dL — ABNORMAL LOW (ref 8.9–10.3)
Chloride: 112 mmol/L — ABNORMAL HIGH (ref 98–111)
Creatinine, Ser: 6.6 mg/dL — ABNORMAL HIGH (ref 0.44–1.00)
GFR calc Af Amer: 7 mL/min — ABNORMAL LOW (ref >60)
GFR calc non Af Amer: 6 mL/min — ABNORMAL LOW (ref >60)
Glucose, Bld: 91 mg/dL (ref 70–99)
Phosphorus: 5.6 mg/dL — ABNORMAL HIGH (ref 2.5–4.6)
Potassium: 4.1 mmol/L (ref 3.5–5.1)
Sodium: 140 mmol/L (ref 135–145)

## 2019-02-06 LAB — GLUCOSE, CAPILLARY
Glucose-Capillary: 106 mg/dL — ABNORMAL HIGH (ref 70–99)
Glucose-Capillary: 114 mg/dL — ABNORMAL HIGH (ref 70–99)
Glucose-Capillary: 125 mg/dL — ABNORMAL HIGH (ref 70–99)
Glucose-Capillary: 150 mg/dL — ABNORMAL HIGH (ref 70–99)
Glucose-Capillary: 80 mg/dL (ref 70–99)
Glucose-Capillary: 99 mg/dL (ref 70–99)

## 2019-02-06 LAB — CBC
HCT: 27.9 % — ABNORMAL LOW (ref 36.0–46.0)
Hemoglobin: 8.8 g/dL — ABNORMAL LOW (ref 12.0–15.0)
MCH: 26.5 pg (ref 26.0–34.0)
MCHC: 31.5 g/dL (ref 30.0–36.0)
MCV: 84 fL (ref 80.0–100.0)
Platelets: 268 10*3/uL (ref 150–400)
RBC: 3.32 MIL/uL — ABNORMAL LOW (ref 3.87–5.11)
RDW: 16.3 % — ABNORMAL HIGH (ref 11.5–15.5)
WBC: 13.5 10*3/uL — ABNORMAL HIGH (ref 4.0–10.5)
nRBC: 0 % (ref 0.0–0.2)

## 2019-02-06 MED ORDER — ALBUMIN HUMAN 25 % IV SOLN
12.5000 g | Freq: Once | INTRAVENOUS | Status: AC
  Administered 2019-02-06: 12.5 g via INTRAVENOUS
  Filled 2019-02-06: qty 50

## 2019-02-06 MED ORDER — FUROSEMIDE 10 MG/ML IJ SOLN
120.0000 mg | Freq: Once | INTRAVENOUS | Status: AC
  Administered 2019-02-07: 120 mg via INTRAVENOUS
  Filled 2019-02-06: qty 10

## 2019-02-06 MED ORDER — FUROSEMIDE 10 MG/ML IJ SOLN
80.0000 mg | Freq: Two times a day (BID) | INTRAMUSCULAR | Status: DC
  Administered 2019-02-06: 80 mg via INTRAVENOUS
  Filled 2019-02-06: qty 8

## 2019-02-06 MED ORDER — FUROSEMIDE 10 MG/ML IJ SOLN
120.0000 mg | Freq: Once | INTRAVENOUS | Status: AC
  Administered 2019-02-06: 120 mg via INTRAVENOUS
  Filled 2019-02-06: qty 12

## 2019-02-06 MED ORDER — ALBUMIN HUMAN 25 % IV SOLN
12.5000 g | Freq: Once | INTRAVENOUS | Status: AC
  Administered 2019-02-07: 06:00:00 12.5 g via INTRAVENOUS
  Filled 2019-02-06: qty 50

## 2019-02-06 MED ORDER — FUROSEMIDE 10 MG/ML IJ SOLN
120.0000 mg | Freq: Once | INTRAVENOUS | Status: AC
  Administered 2019-02-06: 120 mg via INTRAVENOUS
  Filled 2019-02-06: qty 10

## 2019-02-06 MED ORDER — SODIUM CHLORIDE 0.9 % IV SOLN
INTRAVENOUS | Status: DC | PRN
  Administered 2019-02-06 – 2019-02-07 (×3): 250 mL via INTRAVENOUS
  Administered 2019-02-17 (×2): via INTRAVENOUS

## 2019-02-06 NOTE — Progress Notes (Signed)
Patient ID: [name], female   DOB: 1961-02-10, 58 y.o.   MRN: [id number]  [facility] Progress Note   Assessment/ Plan:   1.  Acute kidney injury on chronic kidney disease stage IV: This appears to be hemodynamically mediated with hypertensive emergency/volume contraction from GI loss.    Renal function worse this morning with persistent volume overload-we will restart diuretics and continue to follow this.  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  Will decide on timing for access placement based on trajectory of renal function this hospitalization. 2.  Hypertensive emergency: With acute kidney injury on chronic kidney disease, now transition to oral antihypertensive therapy from Cardene drip. 3.  Status post renal biopsy with left perinephric hematoma: Hemoglobin/hematocrit appear to be trending down which raises some concern with regards to need for repeat abdominal imaging; repeat CT scan if hemoglobin/hematocrit falls lower than current level on labs tomorrow (hemoglobin on 9/30 was 9.3). 4.  Diabetes mellitus: Hold/discontinue metformin indefinitely with her current GFR.    Continue SSI.  5.  Leukocytosis: Without fever or other focal infection.  Decreasing WBC count noted on broad-spectrum coverage with cefepime and vancomycin.  Subjective:   Denies any chest pain or shortness of breath.  Weaned off of Cardene drip.   Objective:   BP 139/62   Pulse 77   Temp 98.6 F (37 C) (Oral)   Resp (!) 29   Wt 118.9 kg   SpO2 100%   BMI 38.71 kg/m   Intake/Output Summary (Last 24 hours) at 02/06/2019 0820 Last data filed at 02/06/2019 0600 Gross per 24 hour  Intake 1045.55 ml  Output -  Net 1045.55 ml   Weight change:   Physical Exam: Gen: Comfortably sitting up in recliner CVS: Pulse regular rhythm, normal rate, S1 and S2 normal Resp: Clear to auscultation  bilaterally, no rales or rhonchi Abd: Soft, obese, nontender Ext: 2-3+ upper and lower extremity edema/anasarca  Imaging: Ct Abdomen Pelvis Wo Contrast  Result Date: 02/05/2019 CLINICAL DATA:  Nausea and vomiting. Kidney biopsy earlier today. Chest pain and marked hypertension EXAM: CT CHEST, ABDOMEN AND PELVIS WITHOUT CONTRAST TECHNIQUE: Multidetector CT imaging of the chest, abdomen and pelvis was performed following the standard protocol without IV contrast. COMPARISON:  Abdominal CT 08/15/2017 FINDINGS: CT CHEST FINDINGS Cardiovascular: Borderline heart size. Trace pericardial fluid or less likely thickening. Mild atherosclerotic calcification of the aorta. Mediastinum/Nodes: No adenopathy. Lungs/Pleura: There is no edema, consolidation, effusion, or pneumothorax. Musculoskeletal: No acute or aggressive finding CT ABDOMEN PELVIS FINDINGS Hepatobiliary: No focal liver abnormality.Cholecystectomy. No bile duct dilatation Pancreas: Unremarkable. Spleen: Unremarkable. Adrenals/Urinary Tract: Negative adrenals. Mild high-density stranding around the lower pole left kidney where there is minimal parenchymal high-density and gas, expected biopsy changes. No measurable hematoma or cortical mass effect. 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 Stomach/Bowel:  No obstruction. No evidence of bowel inflammation Vascular/Lymphatic: No acute vascular abnormality. No mass or adenopathy. Reproductive:Lobulated enlarged uterus from fibroids which are difficult to discretely visualize by noncontrast CT Other: No ascites or pneumoperitoneum. Fatty wide necked umbilical hernia. Musculoskeletal: Degenerative changes without acute finding IMPRESSION: 1. Small volume parenchymal and perinephric hemorrhage at the left renal biopsy site which is commonly seen. 2. No acute intrathoracic finding. 3. Small volume pericardial effusion not seen August 15, 2017. Electronically Signed    By: Monte Fantasia M.D.   On: 02/05/2019  04:46   Dg Chest 2 View  Result Date: 02/05/2019 CLINICAL DATA:  Shortness of breath EXAM: CHEST - 2 VIEW COMPARISON:  Yesterday FINDINGS: Cardiomegaly. Low lung volumes. There is no edema, consolidation, effusion, or pneumothorax. IMPRESSION: 1. No acute finding. 2. Chronic cardiomegaly Electronically Signed   By: Monte Fantasia M.D.   On: 02/05/2019 04:29   Dg Chest 2 View  Result Date: 02/04/2019 CLINICAL DATA:  Chest pain EXAM: CHEST - 2 VIEW COMPARISON:  Radiograph 08/06/2015, CT 01/19/2012 FINDINGS: Lung volumes are diminished. Streaky basilar areas of atelectasis. Accounting for body habitus, the lungs are otherwise clear. There is cardiomegaly and abundant mediastinal fat, similar to priors. Cardiomediastinal contours are otherwise unremarkable. Degenerative changes are present in the imaged spine and shoulders. IMPRESSION: Low lung volumes with bibasilar atelectasis. No other acute cardiopulmonary abnormality. Stable cardiomegaly. Electronically Signed   By: Lovena Le M.D.   On: 02/04/2019 22:48   Ct Chest Wo Contrast  Result Date: 02/05/2019 CLINICAL DATA:  Nausea and vomiting. Kidney biopsy earlier today. Chest pain and marked hypertension EXAM: CT CHEST, ABDOMEN AND PELVIS WITHOUT CONTRAST TECHNIQUE: Multidetector CT imaging of the chest, abdomen and pelvis was performed following the standard protocol without IV contrast. COMPARISON:  Abdominal CT 08/15/2017 FINDINGS: CT CHEST FINDINGS Cardiovascular: Borderline heart size. Trace pericardial fluid or less likely thickening. Mild atherosclerotic calcification of the aorta. Mediastinum/Nodes: No adenopathy. Lungs/Pleura: There is no edema, consolidation, effusion, or pneumothorax. Musculoskeletal: No acute or aggressive finding CT ABDOMEN PELVIS FINDINGS Hepatobiliary: No focal liver abnormality.Cholecystectomy. No bile duct dilatation Pancreas: Unremarkable. Spleen: Unremarkable. Adrenals/Urinary  Tract: Negative adrenals. Mild high-density stranding around the lower pole left kidney where there is minimal parenchymal high-density and gas, expected biopsy changes. No measurable hematoma or cortical mass effect. 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 Stomach/Bowel:  No obstruction. No evidence of bowel inflammation Vascular/Lymphatic: No acute vascular abnormality. No mass or adenopathy. Reproductive:Lobulated enlarged uterus from fibroids which are difficult to discretely visualize by noncontrast CT Other: No ascites or pneumoperitoneum. Fatty wide necked umbilical hernia. Musculoskeletal: Degenerative changes without acute finding IMPRESSION: 1. Small volume parenchymal and perinephric hemorrhage at the left renal biopsy site which is commonly seen. 2. No acute intrathoracic finding. 3. Small volume pericardial effusion not seen August 15, 2017. Electronically Signed   By: Monte Fantasia M.D.   On: 02/05/2019 04:46   Vas [place] Upper Extremity Venous Duplex  Result Date: 02/05/2019 UPPER VENOUS STUDY  Indications: Swelling, and Pain Comparison Study: no prior Performing Technologist: Abram Sander RVS  Examination Guidelines: A complete evaluation includes B-mode imaging, spectral Doppler, color Doppler, and power Doppler as needed of all accessible portions of each vessel. Bilateral testing is considered an integral part of a complete examination. Limited examinations for reoccurring indications may be performed as noted.  Right Findings: +----------+------------+---------+-----------+----------+-------+ RIGHT     CompressiblePhasicitySpontaneousPropertiesSummary +----------+------------+---------+-----------+----------+-------+ IJV           Full       Yes       Yes                      +----------+------------+---------+-----------+----------+-------+ Subclavian    Full       Yes       Yes                       +----------+------------+---------+-----------+----------+-------+ Axillary      Full  Yes       Yes                      +----------+------------+---------+-----------+----------+-------+ Brachial      Full       Yes       Yes                      +----------+------------+---------+-----------+----------+-------+ Radial        Full                                          +----------+------------+---------+-----------+----------+-------+ Ulnar         Full                                          +----------+------------+---------+-----------+----------+-------+ Cephalic      Full                                          +----------+------------+---------+-----------+----------+-------+ Basilic       Full                                          +----------+------------+---------+-----------+----------+-------+  Left Findings: +----------+------------+---------+-----------+----------+--------------+ LEFT      CompressiblePhasicitySpontaneousProperties   Summary     +----------+------------+---------+-----------+----------+--------------+ Subclavian                                          Not visualized +----------+------------+---------+-----------+----------+--------------+  Summary:  Right: No evidence of deep vein thrombosis in the upper extremity. No evidence of superficial vein thrombosis in the upper extremity.  *See table(s) above for measurements and observations.  Diagnosing physician: Monica Martinez MD Electronically signed by Monica Martinez MD on 02/05/2019 at 4:45:19 PM.    Final     Labs: BMET Recent Labs  Lab 02/04/19 2205 02/05/19 0306 02/05/19 1224 02/06/19 0250  NA 141 144  --  140  K 3.7 3.8  --  4.1  CL 109  --   --  112*  CO2 19*  --   --  18*  GLUCOSE 169*  --   --  91  BUN 39*  --   --  52*  CREATININE 5.07*  --  5.76* 6.60*  CALCIUM 9.3  --   --  8.0*  PHOS  --   --   --  5.6*   CBC Recent Labs  Lab  02/03/19 0703 02/04/19 2205 02/05/19 0306 02/05/19 1224 02/06/19 0250  WBC 9.1 17.9*  --  15.7* 13.5*  HGB 9.7* 13.2 11.6* 10.6* 8.8*  HCT 30.2* 40.2 34.0* 33.6* 27.9*  MCV 84.4 81.9  --  83.4 84.0  PLT 287 371  --  349 268    Medications:    . amLODipine  10 mg Oral Daily  . carvedilol  25 mg Oral BID WC  . Chlorhexidine Gluconate Cloth  6 each Topical Daily  . cloNIDine  0.2 mg Oral TID  . heparin  5,000 Units Subcutaneous Q8H  . insulin aspart  0-15 Units Subcutaneous Q4H  . vancomycin variable dose per unstable renal function (pharmacist dosing)   Does not apply See admin instructions   Elmarie Shiley, MD 02/06/2019, 8:20 AM

## 2019-02-06 NOTE — Progress Notes (Signed)
NAME:  [name], MRN:  [id number], DOB:  [birth year], LOS: 1 ADMISSION DATE:  02/04/2019, CONSULTATION DATE: 02/05/2019 REFERRING MD: EDP, CHIEF COMPLAINT: Hypertensive emergency  Brief History   58 Y/O with type 2 diabetes on metformin, GERD, hyperlipidemia, hypertension,CKD. Status post kidney biopsy on 10/30 for evaluation of chronic kidney disease.  Developed nausea, emesis after procedure.  She has not been taking her BP medications since day before biopsy. Came in with persistent emesis, chest pain.  Found to have hypertensive emergency.  Started on nicardipine drip.  Given 2 L of IV fluid.  PCCM consulted for further admission to ICU.                        Past Medical History  Diabetes, GERD, hyperlipidemia, hypertension, chronic kidney disease  Significant Hospital Events   10/2- admit  Consults:  PCCM, nephrology  Procedures:    Significant Diagnostic Tests:  CT chest abdomen pelvis 10/2- small volume perinephric hemorrhage in the left biopsy site.  No acute intrathoracic finding.  Small volume pericardial effusion.  I have reviewed the images personally.  Micro Data:  Blood cultures 10/2  Antimicrobials:  Vanco, cefepime  Interim history/subjective:  Off Cardene drip since late last evening.  She has some lightheadedness, but admits that her blood pressure here is significantly lower than her usual blood pressures at home.  She denies pain, nausea, vomiting, chest pain, shortness of breath.  Objective   Blood pressure (!) 142/69, pulse 78, temperature 98.6 F (37 C), temperature source Oral, resp. rate (!) 28, weight 118.9 kg, SpO2 99 %.        Intake/Output Summary (Last 24 hours) at 02/06/2019 1045 Last data filed at 02/06/2019 0900 Gross per 24 hour  Intake 1285.55 ml  Output -  Net 1285.55 ml   Filed Weights   02/06/19 0418  Weight: 118.9 kg    Examination: Gen:      Middle-aged woman sitting up in the chair no acute distress  HEENT:  Normocephalic, atraumatic, anicteric sclera Neck:     Supple Lungs:    Clear to auscultation bilaterally, breathing comfortably on room air CV:         Regular rate and rhythm, no murmurs Abd:   Obese, nontender, nondistended Ext: Via bilateral pitting lower extremity, no clubbing or cyanosis Skin:     No rashes or wounds Neuro: Awake and alert, answering questions appropriately, normal speech, moving all extremities spontaneously, no focal deficits Psych: Normal behavior  Resolved Hospital Problem list     Assessment & Plan:  58 year old with chronic kidney disease presenting with hypertensive emergency, emesis after recent kidney biopsy.  Hypertensive emergency -improved with oral medication tolerance, and no longer requiring nicardipine -Stable for stepdown to the floor.  Discussed with [facility] hospitalists. -Continue amlodipine, carvedilol, clonidine, Lasix -Zofran PRN for nausea.  May need IV pushes if unable to tolerate oral medications  Chronic kidney disease-rapidly progressive, but attributable to poorly controlled hypertension.  Recent renal biopsy with perinephric hematoma related to uncontrolled hypertension. -Appreciate nephrology's assistance - Long-term needs better control of hypertension  Acute on chronic anemia, likely some blood loss associated with perinephric hematoma -Repeat CT scan to evaluate for enlargement of perinephric hematoma if anemia worsens  Small pericardial effusion, elevated troponin, BNP. Findings likely secondary to uremia, hypertensive emergency. -Optimize volume status and long-term hypertension management  Diabetes mellitus-not currently requiring insulin -Holding metformin indefinitely due to severity of kidney disease -Continue Accu-Cheks every  4 hours with sliding scale insulin PRN -Diabetic diet  Leukocytosis-suspect this is due to acuity of presentation rather than an infectious etiology -Discontinue antibiotics and monitor -Continue  to monitor culture results   Best practice:  Diet: Clear, advance as tolerated Pain/Anxiety/Delirium protocol (if indicated): NA VAP protocol (if indicated): NA DVT prophylaxis: Heparin SQ GI prophylaxis: NA Glucose control: SSI Mobility: Bed Code Status: Full Family Communication: Patient updated Disposition: Stepdown  Labs   CBC: Recent Labs  Lab 02/03/19 0703 02/04/19 2205 02/05/19 0306 02/05/19 1224 02/06/19 0250  WBC 9.1 17.9*  --  15.7* 13.5*  HGB 9.7* 13.2 11.6* 10.6* 8.8*  HCT 30.2* 40.2 34.0* 33.6* 27.9*  MCV 84.4 81.9  --  83.4 84.0  PLT 287 371  --  349 332    Basic Metabolic Panel: Recent Labs  Lab 02/04/19 2205 02/05/19 0306 02/05/19 1224 02/06/19 0250  NA 141 144  --  140  K 3.7 3.8  --  4.1  CL 109  --   --  112*  CO2 19*  --   --  18*  GLUCOSE 169*  --   --  91  BUN 39*  --   --  52*  CREATININE 5.07*  --  5.76* 6.60*  CALCIUM 9.3  --   --  8.0*  PHOS  --   --   --  5.6*   GFR: Estimated Creatinine Clearance: 12.5 mL/min (A) (by C-G formula based on SCr of 6.6 mg/dL (H)). Recent Labs  Lab 02/03/19 0703 02/04/19 2205 02/05/19 0133 02/05/19 1224 02/05/19 1230 02/06/19 0250  WBC 9.1 17.9*  --  15.7*  --  13.5*  LATICACIDVEN  --   --  0.8  --  1.3  --     Liver Function Tests: Recent Labs  Lab 02/05/19 0043 02/06/19 0250  AST 22  --   ALT 21  --   ALKPHOS 141*  --   BILITOT 0.8  --   PROT 7.7  --   ALBUMIN 2.8* 2.2*   No results for input(s): LIPASE, AMYLASE in the last 168 hours. No results for input(s): AMMONIA in the last 168 hours.  ABG    Component Value Date/Time   PHART 7.379 02/05/2019 0306   PCO2ART 30.0 (L) 02/05/2019 0306   PO2ART 74.0 (L) 02/05/2019 0306   HCO3 17.7 (L) 02/05/2019 0306   TCO2 19 (L) 02/05/2019 0306   ACIDBASEDEF 6.0 (H) 02/05/2019 0306   O2SAT 95.0 02/05/2019 0306     Coagulation Profile: Recent Labs  Lab 02/03/19 0703  INR 1.1    Cardiac Enzymes: No results for input(s): CKTOTAL,  CKMB, CKMBINDEX, TROPONINI in the last 168 hours.  HbA1C: Hemoglobin A1C  Date/Time Value Ref Range Status  08/06/2017 01:53 PM 8.9  Final  05/14/2017 03:20 PM 11.0  Final   HbA1c, POC (controlled diabetic range)  Date/Time Value Ref Range Status  11/12/2018 08:50 AM 6.8 0.0 - 7.0 % Final   Hgb A1c MFr Bld  Date/Time Value Ref Range Status  08/07/2015 04:06 AM 10.4 (H) 4.8 - 5.6 % Final    Comment:    (NOTE)         Pre-diabetes: 5.7 - 6.4         Diabetes: >6.4         Glycemic control for adults with diabetes: <7.0   01/19/2012 02:13 PM 9.7 (H) <5.7 % Final    Comment:    (NOTE)  According to the ADA Clinical Practice Recommendations for 2011, when HbA1c is used as a screening test:  >=6.5%   Diagnostic of Diabetes Mellitus           (if abnormal result is confirmed) 5.7-6.4%   Increased risk of developing Diabetes Mellitus References:Diagnosis and Classification of Diabetes Mellitus,Diabetes FJKN,4000,50(RYRYG 1):S62-S69 and Standards of Medical Care in         Diabetes - 2011,Diabetes BBHQ,8266,66 (Suppl 1):S11-S61.    CBG: Recent Labs  Lab 02/05/19 1222 02/05/19 1623 02/05/19 2018 02/05/19 2336 02/06/19 0744  GLUCAP 164* 99 171* 106* 99    Julian Hy, DO 02/06/19 10:45 AM [facility]

## 2019-02-07 DIAGNOSIS — N184 Chronic kidney disease, stage 4 (severe): Secondary | ICD-10-CM | POA: Diagnosis not present

## 2019-02-07 DIAGNOSIS — G4733 Obstructive sleep apnea (adult) (pediatric): Secondary | ICD-10-CM

## 2019-02-07 DIAGNOSIS — E1122 Type 2 diabetes mellitus with diabetic chronic kidney disease: Secondary | ICD-10-CM

## 2019-02-07 DIAGNOSIS — I161 Hypertensive emergency: Secondary | ICD-10-CM | POA: Diagnosis not present

## 2019-02-07 LAB — RENAL FUNCTION PANEL
Albumin: 2.5 g/dL — ABNORMAL LOW (ref 3.5–5.0)
Anion gap: 10 (ref 5–15)
BUN: 61 mg/dL — ABNORMAL HIGH (ref 6–20)
CO2: 19 mmol/L — ABNORMAL LOW (ref 22–32)
Calcium: 8.1 mg/dL — ABNORMAL LOW (ref 8.9–10.3)
Chloride: 107 mmol/L (ref 98–111)
Creatinine, Ser: 7.95 mg/dL — ABNORMAL HIGH (ref 0.44–1.00)
GFR calc Af Amer: 6 mL/min — ABNORMAL LOW (ref >60)
GFR calc non Af Amer: 5 mL/min — ABNORMAL LOW (ref >60)
Glucose, Bld: 113 mg/dL — ABNORMAL HIGH (ref 70–99)
Phosphorus: 6.2 mg/dL — ABNORMAL HIGH (ref 2.5–4.6)
Potassium: 3.9 mmol/L (ref 3.5–5.1)
Sodium: 136 mmol/L (ref 135–145)

## 2019-02-07 LAB — CBC
HCT: 29.5 % — ABNORMAL LOW (ref 36.0–46.0)
Hemoglobin: 9.6 g/dL — ABNORMAL LOW (ref 12.0–15.0)
MCH: 26.9 pg (ref 26.0–34.0)
MCHC: 32.5 g/dL (ref 30.0–36.0)
MCV: 82.6 fL (ref 80.0–100.0)
Platelets: 238 10*3/uL (ref 150–400)
RBC: 3.57 MIL/uL — ABNORMAL LOW (ref 3.87–5.11)
RDW: 15.6 % — ABNORMAL HIGH (ref 11.5–15.5)
WBC: 9.2 10*3/uL (ref 4.0–10.5)
nRBC: 0 % (ref 0.0–0.2)

## 2019-02-07 LAB — GLUCOSE, CAPILLARY
Glucose-Capillary: 100 mg/dL — ABNORMAL HIGH (ref 70–99)
Glucose-Capillary: 106 mg/dL — ABNORMAL HIGH (ref 70–99)
Glucose-Capillary: 107 mg/dL — ABNORMAL HIGH (ref 70–99)
Glucose-Capillary: 129 mg/dL — ABNORMAL HIGH (ref 70–99)
Glucose-Capillary: 152 mg/dL — ABNORMAL HIGH (ref 70–99)
Glucose-Capillary: 92 mg/dL (ref 70–99)
Glucose-Capillary: 93 mg/dL (ref 70–99)

## 2019-02-07 MED ORDER — FUROSEMIDE 10 MG/ML IJ SOLN
120.0000 mg | Freq: Three times a day (TID) | INTRAVENOUS | Status: DC
  Administered 2019-02-07 – 2019-02-08 (×3): 120 mg via INTRAVENOUS
  Filled 2019-02-07: qty 10
  Filled 2019-02-07 (×3): qty 12

## 2019-02-07 NOTE — Progress Notes (Signed)
Patient ID: [name], female   DOB: Mar 29, 1961, 58 y.o.   MRN: [id number] [facility] Progress Note   Assessment/ Plan:   1.  Acute kidney injury on chronic kidney disease stage IV: This appears to be hemodynamically mediated with hypertensive emergency/volume contraction from GI loss.  Poor response to diuretics overnight and continued rise of creatinine noted without acute dialysis needs.  Will try diuretics for the next 24 hours again to see if we can augment urine output. 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  If renal function continues to worsen, may need to start dialysis this hospitalization. 2.  Hypertensive emergency: With acute kidney injury on chronic kidney disease, blood pressures better now with oral antihypertensive therapy-we will continue to follow. 3.  Status post renal biopsy with left perinephric hematoma: Downtrending hemoglobin and hematocrit noted on labs yesterday, will repeat CBC again this morning to see if she needs repeat CT scan of the abdomen/pelvis to assess for enlarging hematoma (she denies pain). 4.  Diabetes mellitus: Hold/discontinue metformin indefinitely with her current GFR.    Continue SSI.  5.  Leukocytosis: Without fever or other focal infection.  Decreasing WBC count noted yesterday on labs on broad-spectrum coverage with cefepime and vancomycin.  Subjective:   Denies any chest pain or shortness of breath and had some lightheadedness yesterday.  Denies any abnormal limb jerking movements.   Objective:   BP (!) 163/74   Pulse 68   Temp 97.7 F (36.5 C) (Oral)   Resp (!) 26   Wt 119.6 kg   SpO2 100%   BMI 38.94 kg/m   Intake/Output Summary (Last 24 hours) at 02/07/2019 0755 Last data filed at 02/07/2019 0600 Gross per 24 hour  Intake 1091.17 ml  Output 545 ml  Net 546.17 ml   Weight change: 0.7 kg  Physical  Exam: Gen: Comfortably resting in bed, easy to awaken and engage in conversation CVS: Pulse regular rhythm, normal rate, S1 and S2 normal Resp: Coarse breath sounds bilaterally-diminished over bases.  No distinct rhonchi or rales  Abd: Soft, obese, nontender Ext: 2-3+ upper and lower extremity edema/anasarca  Imaging: Vas [place] Upper Extremity Venous Duplex  Result Date: 02/05/2019 UPPER VENOUS STUDY  Indications: Swelling, and Pain Comparison Study: no prior Performing Technologist: Abram Sander RVS  Examination Guidelines: A complete evaluation includes B-mode imaging, spectral Doppler, color Doppler, and power Doppler as needed of all accessible portions of each vessel. Bilateral testing is considered an integral part of a complete examination. Limited examinations for reoccurring indications may be performed as noted.  Right Findings: +----------+------------+---------+-----------+----------+-------+ RIGHT     CompressiblePhasicitySpontaneousPropertiesSummary +----------+------------+---------+-----------+----------+-------+ IJV           Full       Yes       Yes                      +----------+------------+---------+-----------+----------+-------+ Subclavian    Full       Yes       Yes                      +----------+------------+---------+-----------+----------+-------+ Axillary      Full       Yes       Yes                      +----------+------------+---------+-----------+----------+-------+ Brachial  Full       Yes       Yes                      +----------+------------+---------+-----------+----------+-------+ Radial        Full                                          +----------+------------+---------+-----------+----------+-------+ Ulnar         Full                                          +----------+------------+---------+-----------+----------+-------+ Cephalic      Full                                           +----------+------------+---------+-----------+----------+-------+ Basilic       Full                                          +----------+------------+---------+-----------+----------+-------+  Left Findings: +----------+------------+---------+-----------+----------+--------------+ LEFT      CompressiblePhasicitySpontaneousProperties   Summary     +----------+------------+---------+-----------+----------+--------------+ Subclavian                                          Not visualized +----------+------------+---------+-----------+----------+--------------+  Summary:  Right: No evidence of deep vein thrombosis in the upper extremity. No evidence of superficial vein thrombosis in the upper extremity.  *See table(s) above for measurements and observations.  Diagnosing physician: Monica Martinez MD Electronically signed by Monica Martinez MD on 02/05/2019 at 4:45:19 PM.    Final     Labs: BMET Recent Labs  Lab 02/04/19 2205 02/05/19 0306 02/05/19 1224 02/06/19 0250 02/07/19 0307  NA 141 144  --  140 136  K 3.7 3.8  --  4.1 3.9  CL 109  --   --  112* 107  CO2 19*  --   --  18* 19*  GLUCOSE 169*  --   --  91 113*  BUN 39*  --   --  52* 61*  CREATININE 5.07*  --  5.76* 6.60* 7.95*  CALCIUM 9.3  --   --  8.0* 8.1*  PHOS  --   --   --  5.6* 6.2*   CBC Recent Labs  Lab 02/03/19 0703 02/04/19 2205 02/05/19 0306 02/05/19 1224 02/06/19 0250  WBC 9.1 17.9*  --  15.7* 13.5*  HGB 9.7* 13.2 11.6* 10.6* 8.8*  HCT 30.2* 40.2 34.0* 33.6* 27.9*  MCV 84.4 81.9  --  83.4 84.0  PLT 287 371  --  349 268    Medications:    . amLODipine  10 mg Oral Daily  . carvedilol  25 mg Oral BID WC  . Chlorhexidine Gluconate Cloth  6 each Topical Daily  . cloNIDine  0.2 mg Oral TID  . heparin  5,000 Units Subcutaneous Q8H  . insulin aspart  0-15 Units Subcutaneous Q4H   Elmarie Shiley, MD 02/07/2019, 7:55 AM

## 2019-02-07 NOTE — Progress Notes (Signed)
  Chaplain responded to spiritual care consult.  Following news of possible need for dialysis, pt was fearful and anxious, about her future, worried this meant she would die soon. Also has a 60 year old son and older daughter for whom she is worried how both will take the news.  Chaplain encouraged offered prayer and spiritual encouragement.    Please contact as needed for ongoing support.  Luana Shu [phone]     02/07/19 1232  Clinical Encounter Type  Visited With Patient  Visit Type Initial;Spiritual support  Referral From Patient  Consult/Referral To Chaplain  Spiritual Encounters  Spiritual Needs Prayer  Stress Factors  Patient Stress Factors Health changes

## 2019-02-07 NOTE — Progress Notes (Signed)
Family Medicine Teaching Service Daily Progress Note Intern Pager: [phone]  Patient name: [name] Medical record number: [id number] Date of birth: 1961/02/15 Age: 58 y.o. Gender: female  Primary Care Provider: Matilde Haymaker, MD Consultants: CCM, Nephrology Code Status: Full  Pt Overview and Major Events to Date:  10/2 - admitted for hypertensive emergency  Assessment and Plan: 58yo F with h/o T2DM, GERD, HLD, HTN, CKD presented with persistent emesis, chest pain, and headache found to be in hypertensive emergency in ED in setting of missed doses of BP meds.  Hypertensive emergency Presented with persistent emesis, chest pain, and headache found to be in hypertensive emergency and started on nicardipine drip in ED (10/2). In setting of 3 days of missed BP meds 2/2 emesis. Now on home oral meds. BP this am 408X systolic with 2+ pitting edema to U/LE bilaterally. - continue amlodipine, coreg, clonidine, lasix - nephrology following, appreciate recs - continues on lasix 120mg  18h  Perinephric hemorrhage Visualized on CT A/P at L kidney biopsy site. 2/2 markedly elevated HTN s/p renal biopsy. Hb improved 8.8>9.6 today. - monitor with CBC  AKI on CKD IV Cr 7.95, up from 6.6 yesterday and 5.07 on admission. Baseline ~1.3 although one measuremenet 3.3 in 11/2018. Likely prerenal 2/2 hemodynamic contraction in setting of persistent vomiting and hypertensive emergency. S/p renal biopsy 9/30 with medical renal disease 2/2 HTN and DM2 as well as superimposed FSGS. Nephrology following, mention potential for initiating HD this admission.  - Nephrology following, appreciate recs - continue lasix as above.  Type 2 Diabetes On metformin at home. A1c 6.8 11/2018. With current GFR, will likely discontinue metformin. 4u aspart received last 24 hours though has also received dextrose. - SSI  Leukocytosis, resolved Downtrended 17.9>>9.2 today. Blood cultures NGx24h. CXR, CT chest and A/P without  intrathoracic or infectious abnormalities. No infectious symptoms, no longer nauseous or SOB. S/p empiric abx coverage with Vanc (10/2) and Cefepime (10/2-10/3). Remains afebrile. - monitor  Acute on chronic anemia of CKD 2/2 perinephric hematoma in setting of markedly elevated BP with anemia of CKD. - monitor with daily CBC  FEN/GI: Carb modified PPx: heparin subq  Disposition: pending BP control and AKI resolution  Subjective:  Doing well this morning, denies chest pain, shortness of breath, nausea, vomiting.  Becomes tearful when discussing possibility of hemodialysis.  Objective: Temp:  [97.6 F (36.4 C)-98.4 F (36.9 C)] 97.7 F (36.5 C) (10/04 0732) Pulse Rate:  [65-78] 76 (10/04 0800) Resp:  [15-30] 23 (10/04 0800) BP: (128-169)/(65-79) 169/79 (10/04 0800) SpO2:  [99 %-100 %] 100 % (10/04 0800) Weight:  [119.6 kg] 119.6 kg (10/04 0351) Physical Exam: General: pleasant female, sitting in bed, in NAD Cardiovascular: RRR, no M/R/G Respiratory: CTA B, appropriately saturated on room air, normal WOB Abdomen: Soft, NTND, + BS Extremities: 2+ pitting edema to U/LE bilaterally.  Warm and well perfused.  Laboratory: Recent Labs  Lab 02/04/19 2205 02/05/19 0306 02/05/19 1224 02/06/19 0250  WBC 17.9*  --  15.7* 13.5*  HGB 13.2 11.6* 10.6* 8.8*  HCT 40.2 34.0* 33.6* 27.9*  PLT 371  --  349 268   Recent Labs  Lab 02/04/19 2205 02/05/19 0043 02/05/19 0306 02/05/19 1224 02/06/19 0250 02/07/19 0307  NA 141  --  144  --  140 136  K 3.7  --  3.8  --  4.1 3.9  CL 109  --   --   --  112* 107  CO2 19*  --   --   --  18* 19*  BUN 39*  --   --   --  52* 61*  CREATININE 5.07*  --   --  5.76* 6.60* 7.95*  CALCIUM 9.3  --   --   --  8.0* 8.1*  PROT  --  7.7  --   --   --   --   BILITOT  --  0.8  --   --   --   --   ALKPHOS  --  141*  --   --   --   --   ALT  --  21  --   --   --   --   AST  --  22  --   --   --   --   GLUCOSE 169*  --   --   --  91 113*     Imaging/Diagnostic Tests: No results found.  Rory Percy, DO 02/07/2019, 8:53 AM PGY-3, [facility] Intern pager: [phone], text pages welcome

## 2019-02-08 ENCOUNTER — Inpatient Hospital Stay (HOSPITAL_COMMUNITY): Payer: BC Managed Care – PPO

## 2019-02-08 LAB — CBC
HCT: 28.9 % — ABNORMAL LOW (ref 36.0–46.0)
Hemoglobin: 9.6 g/dL — ABNORMAL LOW (ref 12.0–15.0)
MCH: 27 pg (ref 26.0–34.0)
MCHC: 33.2 g/dL (ref 30.0–36.0)
MCV: 81.4 fL (ref 80.0–100.0)
Platelets: 243 10*3/uL (ref 150–400)
RBC: 3.55 MIL/uL — ABNORMAL LOW (ref 3.87–5.11)
RDW: 15.5 % (ref 11.5–15.5)
WBC: 7.7 10*3/uL (ref 4.0–10.5)
nRBC: 0 % (ref 0.0–0.2)

## 2019-02-08 LAB — GLUCOSE, CAPILLARY
Glucose-Capillary: 91 mg/dL (ref 70–99)
Glucose-Capillary: 94 mg/dL (ref 70–99)
Glucose-Capillary: 139 mg/dL — ABNORMAL HIGH (ref 70–99)
Glucose-Capillary: 117 mg/dL — ABNORMAL HIGH (ref 70–99)
Glucose-Capillary: 119 mg/dL — ABNORMAL HIGH (ref 70–99)

## 2019-02-08 LAB — RENAL FUNCTION PANEL
Albumin: 2.7 g/dL — ABNORMAL LOW (ref 3.5–5.0)
Anion gap: 13 (ref 5–15)
BUN: 68 mg/dL — ABNORMAL HIGH (ref 6–20)
CO2: 18 mmol/L — ABNORMAL LOW (ref 22–32)
Calcium: 8.4 mg/dL — ABNORMAL LOW (ref 8.9–10.3)
Chloride: 105 mmol/L (ref 98–111)
Creatinine, Ser: 9.25 mg/dL — ABNORMAL HIGH (ref 0.44–1.00)
GFR calc Af Amer: 5 mL/min — ABNORMAL LOW (ref >60)
GFR calc non Af Amer: 4 mL/min — ABNORMAL LOW (ref >60)
Glucose, Bld: 127 mg/dL — ABNORMAL HIGH (ref 70–99)
Phosphorus: 6.6 mg/dL — ABNORMAL HIGH (ref 2.5–4.6)
Potassium: 4 mmol/L (ref 3.5–5.1)
Sodium: 136 mmol/L (ref 135–145)

## 2019-02-08 LAB — PROTIME-INR
INR: 1.1 (ref 0.8–1.2)
Prothrombin Time: 13.8 seconds (ref 11.4–15.2)

## 2019-02-08 IMAGING — US US RENAL
1 series · 14 of 25 positions shown · non-contrast
Comparison: CT [DATE] renal ultrasound [DATE]

CLINICAL DATA: Assess for perinephric hematoma

EXAM:
RENAL / URINARY TRACT ULTRASOUND COMPLETE

[Series 1: us renal · 42 acquisitions, 14 frames shown]
[im 1/42]
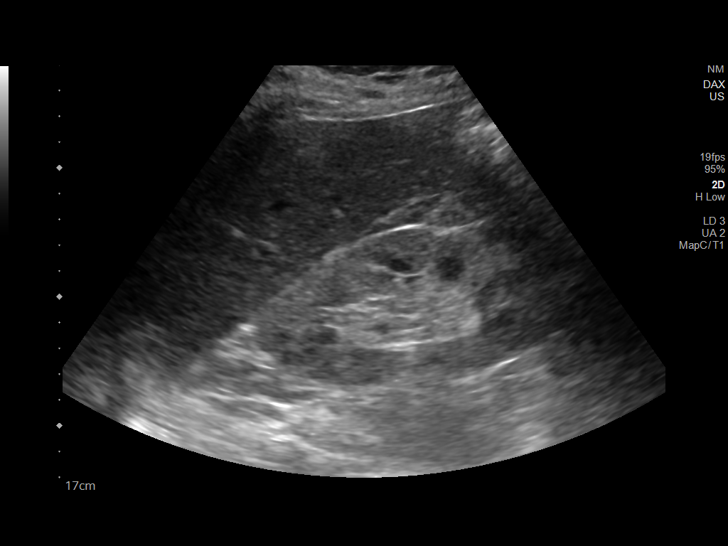
[im 4/42]
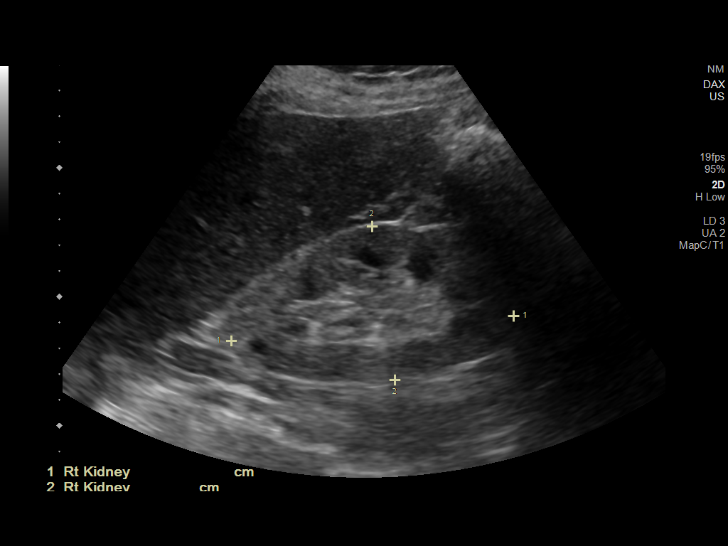
[im 7/42]
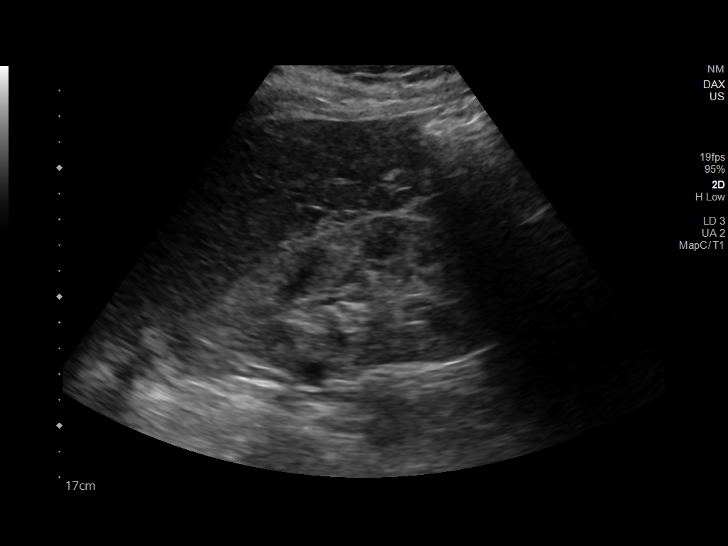
[im 11/42]
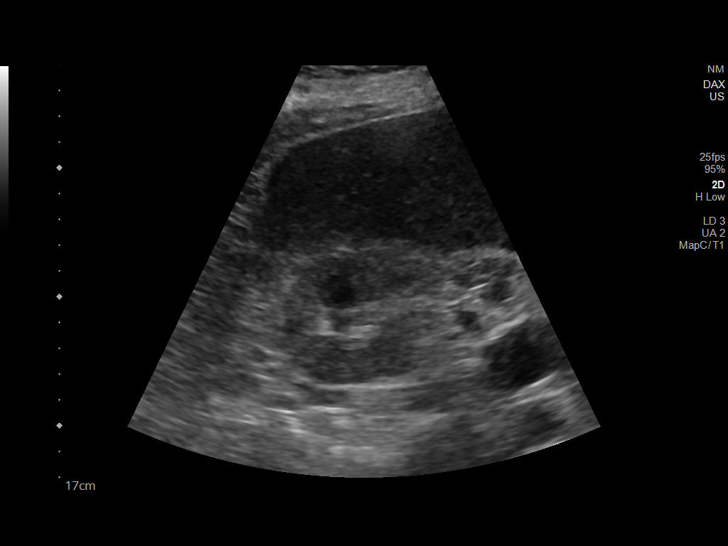
[im 14/42]
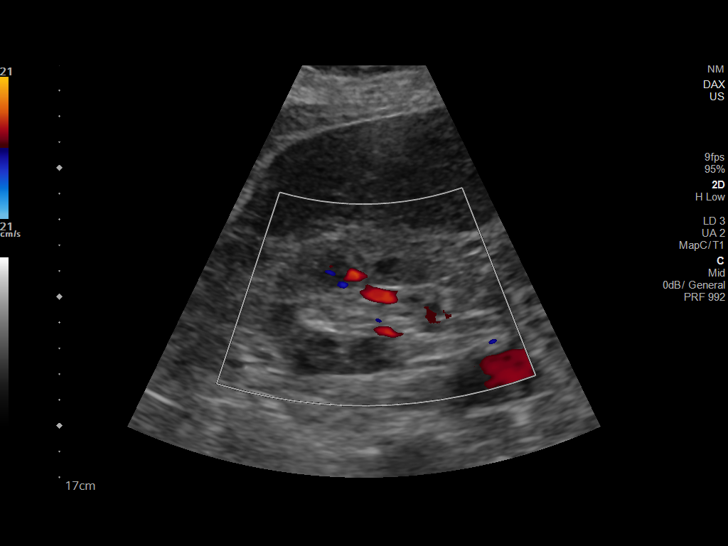
[im 16/42]
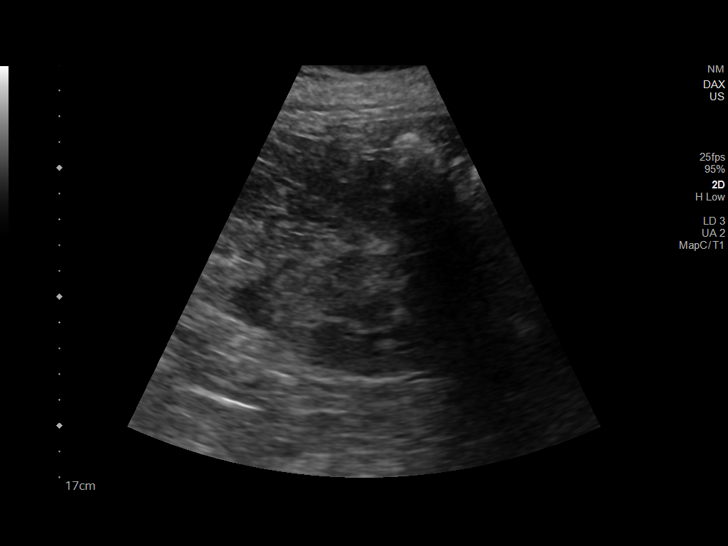
[im 19/42]
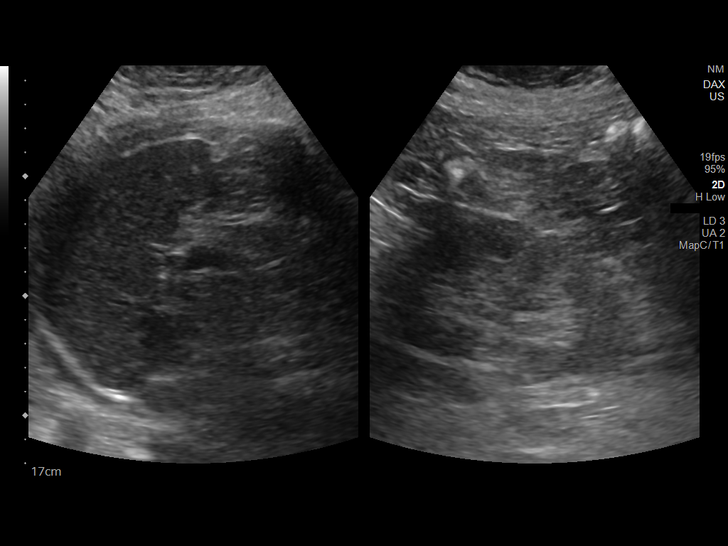
[im 23/42]
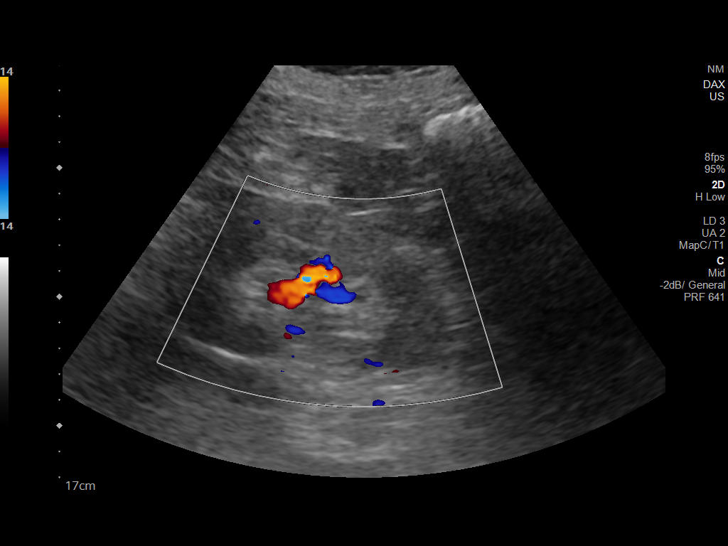
[im 26/42]
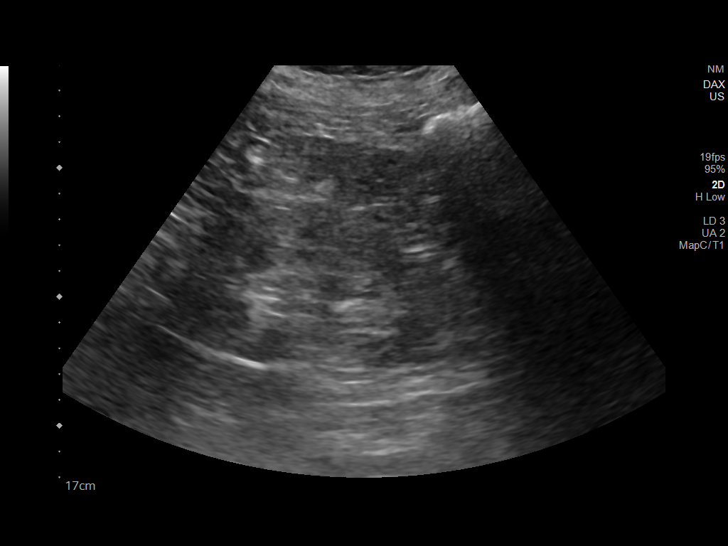
[im 28/42]
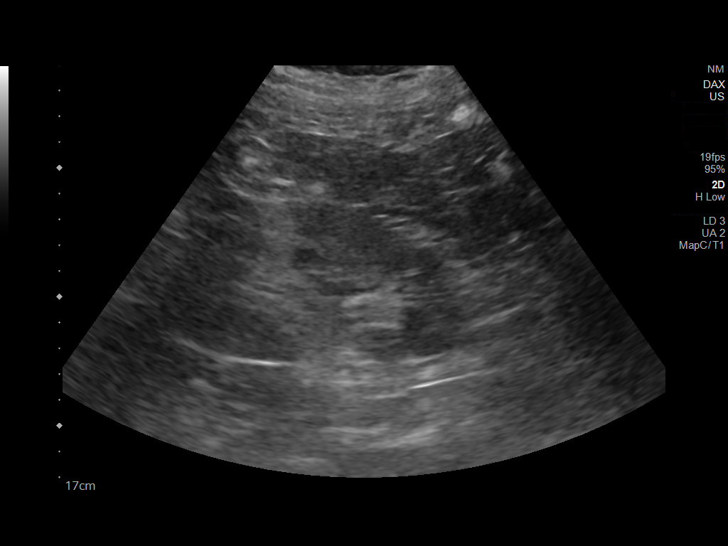
[im 31/42]
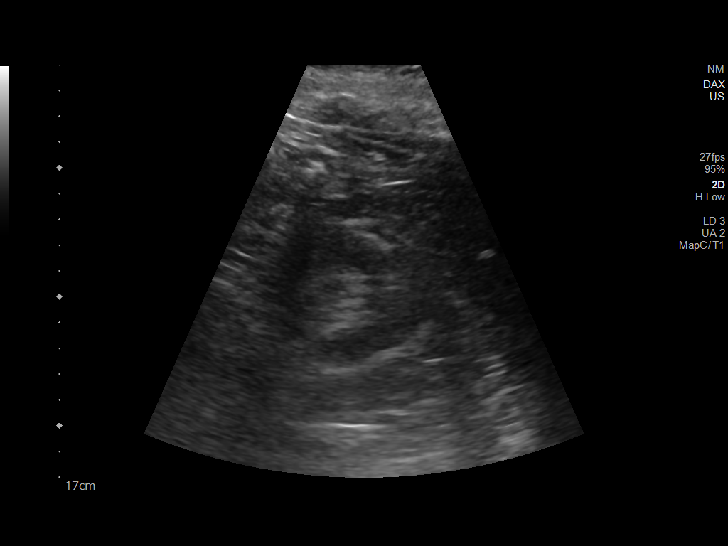
[im 35/42]
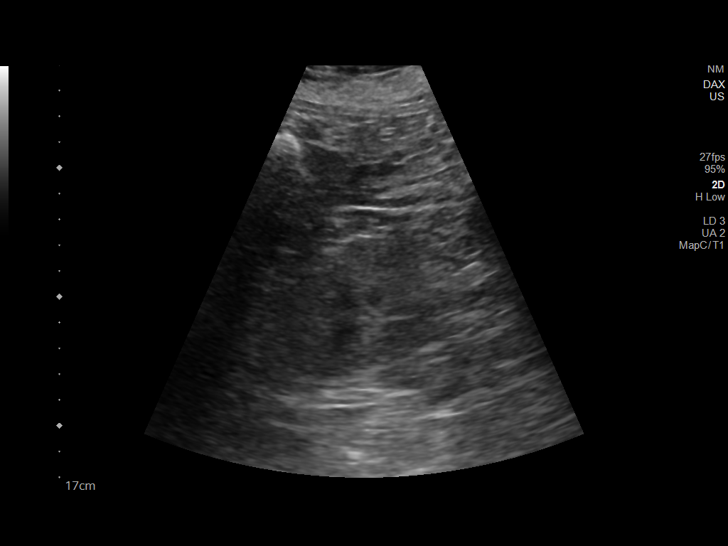
[im 38/42]
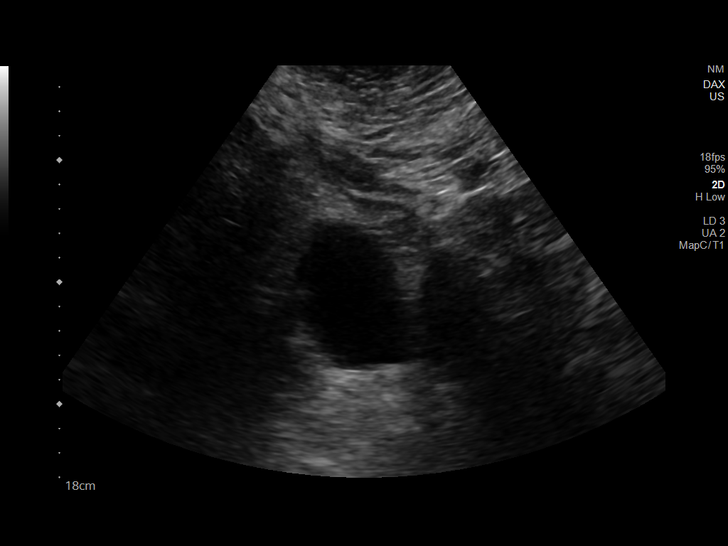
[im 42/42]
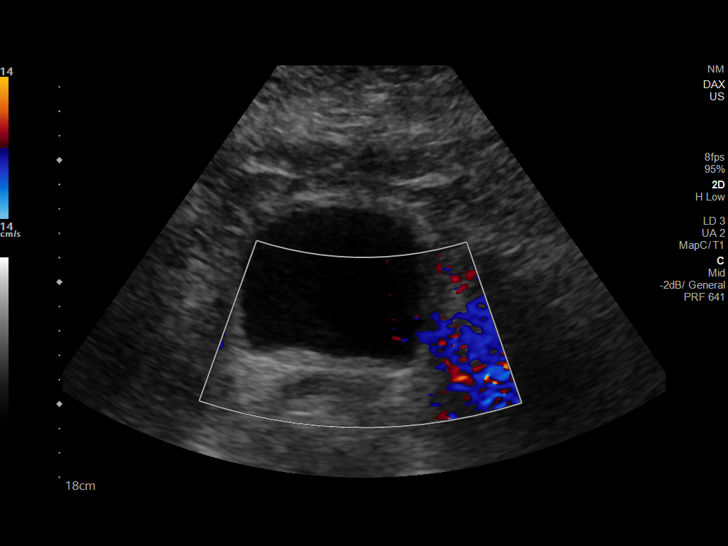

[14 of 25 positions shown; findings below may reference images not displayed]

FINDINGS: Right Kidney:

Renal measurements: 11 x 6 x 6.4 cm = volume: 221.7 mL. Increased
echogenicity. No hydronephrosis. Trace perinephric fluid near the
lower pole. Previously noted cyst in the right kidney on ultrasound
not confidently identified today.

Left Kidney:

Renal measurements: 10.3 x 6.1 x 5.7 cm = volume: 184.3 mL.
Increased cortical echogenicity. No hydronephrosis. Previously
identified complex cyst in the mid left kidney is not well
demonstrated today.

Bladder:

Appears normal for degree of bladder distention.
IMPRESSION: 1. Echogenic kidneys consistent with medical renal disease. No
hydronephrosis
2. Trace perinephric fluid at the lower pole right kidney. No
appreciable left perinephric fluid collections as noted on the
previous CT.

## 2019-02-08 MED ORDER — CHLORHEXIDINE GLUCONATE CLOTH 2 % EX PADS
6.0000 | MEDICATED_PAD | Freq: Every day | CUTANEOUS | Status: DC
  Administered 2019-02-09 – 2019-02-10 (×2): 6 via TOPICAL

## 2019-02-08 MED ORDER — HEPARIN SODIUM (PORCINE) 5000 UNIT/ML IJ SOLN: 5000.0000 [IU] | Freq: Three times a day (TID) | INTRAMUSCULAR | Status: DC

## 2019-02-08 MED ORDER — HEPARIN SODIUM (PORCINE) 5000 UNIT/ML IJ SOLN
5000.0000 [IU] | Freq: Three times a day (TID) | INTRAMUSCULAR | Status: DC
  Administered 2019-02-08: 5000 [IU] via SUBCUTANEOUS
  Filled 2019-02-08: qty 1

## 2019-02-08 MED ORDER — SODIUM BICARBONATE 650 MG PO TABS
650.0000 mg | ORAL_TABLET | Freq: Two times a day (BID) | ORAL | Status: DC
  Administered 2019-02-08 (×2): 650 mg via ORAL
  Filled 2019-02-08 (×2): qty 1

## 2019-02-08 MED ORDER — HEPARIN SODIUM (PORCINE) 5000 UNIT/ML IJ SOLN
5000.0000 [IU] | Freq: Three times a day (TID) | INTRAMUSCULAR | Status: DC
  Administered 2019-02-09 – 2019-02-18 (×23): 5000 [IU] via SUBCUTANEOUS
  Filled 2019-02-08 (×22): qty 1

## 2019-02-08 NOTE — H&P (Signed)
Chief Complaint: Renal failure  Referring Physician(s): Claudia Desanctis  Supervising Physician: Jacqulynn Cadet  Patient Status: [facility] - In-pt  History of Present Illness: CHANEKA TREFZ is a 58 y.o. female who is known to our service.  She underwent random renal biopsy on 02/03/2019 by Dr. Earleen Newport for worsening kidney disease.  Her nephrologist is Dr. Carolin Sicks.  She presented to the ED with persistent nausea and vomiting on 02/04/2019.  She was also hypertensive with SBP 220.  Creatinine was 5.07 and a small perinephric hemorrhage was seen on CT done 02/05/2019.  We are asked to place a tunneled hemodialysis catheter.  She is not NPO currently and had 5000 units Heparin SQ this am.  Past Medical History:  Diagnosis Date   Diabetes mellitus    GERD (gastroesophageal reflux disease)    Hyperlipidemia    Hypertension    Nausea    Renal disorder     Past Surgical History:  Procedure Laterality Date   BREAST SURGERY     reduction   CESAREAN SECTION     x2   CHOLECYSTECTOMY     laparoscopic   REDUCTION MAMMAPLASTY Bilateral     Allergies: Lisinopril and Jardiance [empagliflozin]  Medications: Prior to Admission medications   Medication Sig Start Date End Date Taking? Authorizing Provider  acetaminophen (TYLENOL) 500 MG tablet Take 1,000 mg by mouth every 6 (six) hours as needed for moderate pain or headache.   Yes [provider]  amLODipine (NORVASC) 10 MG tablet TAKE 1 TABLET BY MOUTH ONCE DAILY Patient taking differently: Take 10 mg by mouth daily.  01/06/18  Yes Diallo, Abdoulaye, MD  aspirin EC 325 MG tablet Take 325 mg by mouth daily.   Yes [provider]  atorvastatin (LIPITOR) 20 MG tablet TAKE 1 TABLET BY MOUTH ONCE DAILY AT  6PM Patient taking differently: Take 20 mg by mouth daily at 6 PM.  09/16/17  Yes [name], MD  carvedilol (COREG) 25 MG tablet Take 1 tablet (25 mg total) by mouth 2 (two) times daily with a  meal. 07/01/18  Yes Diallo, Abdoulaye, MD  cloNIDine (CATAPRES) 0.2 MG tablet Take 0.2 mg by mouth 3 (three) times daily.   Yes [provider]  ferrous sulfate 325 (65 FE) MG tablet Take 325 mg by mouth daily with breakfast.   Yes [provider]  furosemide (LASIX) 40 MG tablet Take 40 mg by mouth 2 (two) times daily.   Yes [provider]  polyethylene glycol (MIRALAX / GLYCOLAX) packet Take 17 g by mouth daily as needed for moderate constipation. 08/06/17  Yes [name], MD  potassium chloride SA (K-DUR) 20 MEQ tablet Take 20 mEq by mouth daily.   Yes [provider]  traMADol (ULTRAM) 50 MG tablet TAKE 1 TABLET BY MOUTH EVERY 8 HOURS AS NEEDED Patient taking differently: Take 50 mg by mouth 2 (two) times daily as needed for moderate pain.  02/17/18  Yes Diallo, Abdoulaye, MD  Vitamin D, Ergocalciferol, (DRISDOL) 1.25 MG (50000 UT) CAPS capsule Take 50,000 Units by mouth every Monday.    Yes [provider]  Accu-Chek Softclix Lancets lancets Use as instructed 09/29/18   Diallo, Earna Coder, MD  Blood Glucose Monitoring Suppl (ACCU-CHEK AVIVA PLUS) w/Device KIT Use to check sugar three times a day 05/14/17   [name], MD  glucose blood (ACCU-CHEK AVIVA PLUS) test strip Use as instructed 09/29/18   Marjie Skiff, MD  Lancets (ACCU-CHEK SOFT  TOUCH) lancets Use to check sugars three times a day 09/24/18   Marjie Skiff, MD     Family History  Problem Relation Age of Onset   Hypertension Mother    Diabetes Mother    Cancer Father        lung   Colon cancer Neg Hx    Stomach cancer Neg Hx    Esophageal cancer Neg Hx     Social History   Socioeconomic History   Marital status: Legally Separated    Spouse name: Not on file   Number of children: Not on file   Years of education: Not on file   Highest education level: Not on file  Occupational History   Not on file  Social Needs   Financial resource strain:  Not on file   Food insecurity    Worry: Not on file    Inability: Not on file   Transportation needs    Medical: Not on file    Non-medical: Not on file  Tobacco Use   Smoking status: Former Smoker    Years: 10.00    Types: Cigarettes    Quit date: 10/16/2011    Years since quitting: 7.3   Smokeless tobacco: Never Used   Tobacco comment: 1 pack will last a week  Substance and Sexual Activity   Alcohol use: No    Alcohol/week: 0.0 standard drinks   Drug use: No   Sexual activity: Never  Lifestyle   Physical activity    Days per week: Not on file    Minutes per session: Not on file   Stress: Not on file  Relationships   Social connections    Talks on phone: Not on file    Gets together: Not on file    Attends religious service: Not on file    Active member of club or organization: Not on file    Attends meetings of clubs or organizations: Not on file    Relationship status: Not on file  Other Topics Concern   Not on file  Social History Narrative   Not on file     Review of Systems: A 12 point ROS discussed and pertinent positives are indicated in the HPI above.  All other systems are negative.  Review of Systems  Vital Signs: BP (!) 190/87 (BP Location: Right Arm)    Pulse 84    Temp 98.7 F (37.1 C) (Oral)    Resp 18    Wt 119 kg    SpO2 100%    BMI 38.74 kg/m   Physical Exam Vitals signs reviewed.  Constitutional:      Appearance: She is obese.  HENT:     Head: Normocephalic and atraumatic.  Eyes:     Extraocular Movements: Extraocular movements intact.  Neck:     Musculoskeletal: Normal range of motion.  Cardiovascular:     Rate and Rhythm: Normal rate and regular rhythm.  Pulmonary:     Effort: Pulmonary effort is normal.     Breath sounds: Normal breath sounds.  Abdominal:     Palpations: Abdomen is soft.  Musculoskeletal: Normal range of motion.  Skin:    General: Skin is warm and dry.  Neurological:     General: No focal deficit  present.     Mental Status: She is alert and oriented to person, place, and time.  Psychiatric:        Mood and Affect: Mood normal.        Behavior: Behavior  normal.        Thought Content: Thought content normal.        Judgment: Judgment normal.     Imaging: Ct Abdomen Pelvis Wo Contrast  Result Date: 02/05/2019 CLINICAL DATA:  Nausea and vomiting. Kidney biopsy earlier today. Chest pain and marked hypertension EXAM: CT CHEST, ABDOMEN AND PELVIS WITHOUT CONTRAST TECHNIQUE: Multidetector CT imaging of the chest, abdomen and pelvis was performed following the standard protocol without IV contrast. COMPARISON:  Abdominal CT 08/15/2017 FINDINGS: CT CHEST FINDINGS Cardiovascular: Borderline heart size. Trace pericardial fluid or less likely thickening. Mild atherosclerotic calcification of the aorta. Mediastinum/Nodes: No adenopathy. Lungs/Pleura: There is no edema, consolidation, effusion, or pneumothorax. Musculoskeletal: No acute or aggressive finding CT ABDOMEN PELVIS FINDINGS Hepatobiliary: No focal liver abnormality.Cholecystectomy. No bile duct dilatation Pancreas: Unremarkable. Spleen: Unremarkable. Adrenals/Urinary Tract: Negative adrenals. Mild high-density stranding around the lower pole left kidney where there is minimal parenchymal high-density and gas, expected biopsy changes. No measurable hematoma or cortical mass effect. 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 Stomach/Bowel:  No obstruction. No evidence of bowel inflammation Vascular/Lymphatic: No acute vascular abnormality. No mass or adenopathy. Reproductive:Lobulated enlarged uterus from fibroids which are difficult to discretely visualize by noncontrast CT Other: No ascites or pneumoperitoneum. Fatty wide necked umbilical hernia. Musculoskeletal: Degenerative changes without acute finding IMPRESSION: 1. Small volume parenchymal and perinephric hemorrhage at the left renal biopsy site  which is commonly seen. 2. No acute intrathoracic finding. 3. Small volume pericardial effusion not seen August 15, 2017. Electronically Signed   By: Monte Fantasia M.D.   On: 02/05/2019 04:46   Dg Chest 2 View  Result Date: 02/05/2019 CLINICAL DATA:  Shortness of breath EXAM: CHEST - 2 VIEW COMPARISON:  Yesterday FINDINGS: Cardiomegaly. Low lung volumes. There is no edema, consolidation, effusion, or pneumothorax. IMPRESSION: 1. No acute finding. 2. Chronic cardiomegaly Electronically Signed   By: Monte Fantasia M.D.   On: 02/05/2019 04:29   Dg Chest 2 View  Result Date: 02/04/2019 CLINICAL DATA:  Chest pain EXAM: CHEST - 2 VIEW COMPARISON:  Radiograph 08/06/2015, CT 01/19/2012 FINDINGS: Lung volumes are diminished. Streaky basilar areas of atelectasis. Accounting for body habitus, the lungs are otherwise clear. There is cardiomegaly and abundant mediastinal fat, similar to priors. Cardiomediastinal contours are otherwise unremarkable. Degenerative changes are present in the imaged spine and shoulders. IMPRESSION: Low lung volumes with bibasilar atelectasis. No other acute cardiopulmonary abnormality. Stable cardiomegaly. Electronically Signed   By: Lovena Le M.D.   On: 02/04/2019 22:48   Ct Chest Wo Contrast  Result Date: 02/05/2019 CLINICAL DATA:  Nausea and vomiting. Kidney biopsy earlier today. Chest pain and marked hypertension EXAM: CT CHEST, ABDOMEN AND PELVIS WITHOUT CONTRAST TECHNIQUE: Multidetector CT imaging of the chest, abdomen and pelvis was performed following the standard protocol without IV contrast. COMPARISON:  Abdominal CT 08/15/2017 FINDINGS: CT CHEST FINDINGS Cardiovascular: Borderline heart size. Trace pericardial fluid or less likely thickening. Mild atherosclerotic calcification of the aorta. Mediastinum/Nodes: No adenopathy. Lungs/Pleura: There is no edema, consolidation, effusion, or pneumothorax. Musculoskeletal: No acute or aggressive finding CT ABDOMEN PELVIS FINDINGS  Hepatobiliary: No focal liver abnormality.Cholecystectomy. No bile duct dilatation Pancreas: Unremarkable. Spleen: Unremarkable. Adrenals/Urinary Tract: Negative adrenals. Mild high-density stranding around the lower pole left kidney where there is minimal parenchymal high-density and gas, expected biopsy changes. No measurable hematoma or cortical mass effect. 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 Stomach/Bowel:  No  obstruction. No evidence of bowel inflammation Vascular/Lymphatic: No acute vascular abnormality. No mass or adenopathy. Reproductive:Lobulated enlarged uterus from fibroids which are difficult to discretely visualize by noncontrast CT Other: No ascites or pneumoperitoneum. Fatty wide necked umbilical hernia. Musculoskeletal: Degenerative changes without acute finding IMPRESSION: 1. Small volume parenchymal and perinephric hemorrhage at the left renal biopsy site which is commonly seen. 2. No acute intrathoracic finding. 3. Small volume pericardial effusion not seen August 15, 2017. Electronically Signed   By: Monte Fantasia M.D.   On: 02/05/2019 04:46   US Biopsy (kidney)  Result Date: 02/03/2019 INDICATION: 58 year old female with a history of proteinuria and renal disease EXAM: IMAGE GUIDED medical renal biopsy MEDICATIONS: None. ANESTHESIA/SEDATION: Moderate (conscious) sedation was employed during this procedure. A total of Versed 1.5 mg and Fentanyl 75 mcg was administered intravenously. Moderate Sedation Time: 13 minutes. The patient's level of consciousness and vital signs were monitored continuously by radiology nursing throughout the procedure under my direct supervision. FLUOROSCOPY TIME:  None). COMPLICATIONS: None PROCEDURE: Informed written consent was obtained from the patient after a thorough discussion of the procedural risks, benefits and alternatives. All questions were addressed. Maximal Sterile Barrier Technique was utilized  including caps, mask, sterile gowns, sterile gloves, sterile drape, hand hygiene and skin antiseptic. A timeout was performed prior to the initiation of the procedure. Patient was positioned prone position on the gantry table. Images were stored sent to PACs. Once the patient is prepped and draped in the usual sterile fashion, the skin and subcutaneous tissues overlying the left kidney were generously infiltrated 1% lidocaine for local anesthesia. Using ultrasound guidance, a 15 gauge guide needle was advanced into the lower cortex of the left kidney. Once we confirmed location of the needle tip, 3 separate 16 gauge core biopsy were achieved. Two Gel-Foam pledgets were infused with a small amount of saline. The needle was removed. Final images were stored. The patient tolerated the procedure well and remained hemodynamically stable throughout. No complications were encountered and no significant blood loss encountered. IMPRESSION: Status post image guided biopsy of left kidney for medical renal purpose Signed, Dulcy Fanny. Dellia Nims, RPVI [facility] Electronically Signed   By: Corrie Mckusick D.O.   On: 02/03/2019 11:27   Vas [place] Upper Extremity Venous Duplex  Result Date: 02/05/2019 UPPER VENOUS STUDY  Indications: Swelling, and Pain Comparison Study: no prior Performing Technologist: Abram Sander RVS  Examination Guidelines: A complete evaluation includes B-mode imaging, spectral Doppler, color Doppler, and power Doppler as needed of all accessible portions of each vessel. Bilateral testing is considered an integral part of a complete examination. Limited examinations for reoccurring indications may be performed as noted.  Right Findings: +----------+------------+---------+-----------+----------+-------+  RIGHT      Compressible Phasicity Spontaneous Properties Summary  +----------+------------+---------+-----------+----------+-------+  IJV            Full         Yes        Yes                         +----------+------------+---------+-----------+----------+-------+  Subclavian     Full        Yes        Yes                         +----------+------------+---------+-----------+----------+-------+  Axillary       Full  Yes        Yes                         +----------+------------+---------+-----------+----------+-------+  Brachial       Full        Yes        Yes                         +----------+------------+---------+-----------+----------+-------+  Radial         Full                                               +----------+------------+---------+-----------+----------+-------+  Ulnar          Full                                               +----------+------------+---------+-----------+----------+-------+  Cephalic       Full                                               +----------+------------+---------+-----------+----------+-------+  Basilic        Full                                               +----------+------------+---------+-----------+----------+-------+  Left Findings: +----------+------------+---------+-----------+----------+--------------+  LEFT       Compressible Phasicity Spontaneous Properties    Summary      +----------+------------+---------+-----------+----------+--------------+  Subclavian                                               Not visualized  +----------+------------+---------+-----------+----------+--------------+  Summary:  Right: No evidence of deep vein thrombosis in the upper extremity. No evidence of superficial vein thrombosis in the upper extremity.  *See table(s) above for measurements and observations.  Diagnosing physician: Monica Martinez MD Electronically signed by Monica Martinez MD on 02/05/2019 at 4:45:19 PM.    Final     Labs:  CBC: Recent Labs    02/05/19 1224 02/06/19 0250 02/07/19 0846 02/08/19 0454  WBC 15.7* 13.5* 9.2 7.7  HGB 10.6* 8.8* 9.6* 9.6*  HCT 33.6* 27.9* 29.5* 28.9*    PLT 349 268 238 243    COAGS: Recent Labs    07/08/18 0650 02/03/19 0703 02/08/19 0822  INR 1.0 1.1 1.1    BMP: Recent Labs    02/04/19 2205 02/05/19 0306 02/05/19 1224 02/06/19 0250 02/07/19 0307 02/08/19 0454  NA 141 144  --  140 136 136  K 3.7 3.8  --  4.1 3.9 4.0  CL 109  --   --  112* 107 105  CO2 19*  --   --  18* 19* 18*  GLUCOSE 169*  --   --  91 113* 127*  BUN 39*  --   --  52* 61* 68*  CALCIUM 9.3  --   --  8.0* 8.1* 8.4*  CREATININE 5.07*  --  5.76* 6.60* 7.95* 9.25*  GFRNONAA 9*  --  7* 6* 5* 4*  GFRAA 10*  --  9* 7* 6* 5*    LIVER FUNCTION TESTS: Recent Labs    02/05/19 0043 02/06/19 0250 02/07/19 0307 02/08/19 0454  BILITOT 0.8  --   --   --   AST 22  --   --   --   ALT 21  --   --   --   ALKPHOS 141*  --   --   --   PROT 7.7  --   --   --   ALBUMIN 2.8* 2.2* 2.5* 2.7*    TUMOR MARKERS: No results for input(s): AFPTM, CEA, CA199, CHROMGRNA in the last 8760 hours.  Assessment and Plan:  Progressive renal failure now requiring hemodialysis.  Will proceed with placement of a tunneled HD catheter tomorrow.  Risks and benefits discussed with the patient including, but not limited to bleeding, infection, vascular injury, pneumothorax which may require chest tube placement, air embolism or even death  All of the patient's questions were answered, patient is agreeable to proceed. Consent signed and in chart.  Thank you for this interesting consult.  I greatly enjoyed meeting ZENIA GUEST and look forward to participating in their care.  A copy of this report was sent to the requesting provider on this date.  Electronically Signed: Murrell Redden, PA-C   02/08/2019, 8:48 AM      I spent a total of    25 Minutes in face to face in clinical consultation, greater than 50% of which was counseling/coordinating care for HD catheter placement.

## 2019-02-08 NOTE — TOC Initial Note (Addendum)
Transition of Care [facility]) - Initial/Assessment Note    Patient Details  Name: [name] MRN: [id number] Date of Birth: [birth year]  Transition of Care [facility]) CM/SW Contact:    Bartholomew Crews, RN Phone Number: [phone] 02/08/2019, 11:55 AM  Clinical Narrative:                 Spoke with patient at the bedside. PTA home with her son. 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 She was independent and driving. No HH. No DME.  She verbalized that she will be having a hemodialysis placed tomorrow in her chest and will have dialysis tomorrow. Stated that she was told nothing to eat or drink after midnight. States it is not known if her need for dialysis is short term or long term. Says that she has lots of family/friends who can assist her with transportation if needed.   Following for transition of care needs.   Expected Discharge Plan: Home/Self Care Barriers to Discharge: Continued Medical Work up   Patient Goals and CMS Choice Patient states their goals for this hospitalization and ongoing recovery are:: return to her home      Expected Discharge Plan and Services Expected Discharge Plan: Home/Self Care   Discharge Planning Services: CM Consult   Living arrangements for the past 2 months: Single Family Home                                      Prior Living Arrangements/Services Living arrangements for the past 2 months: Single Family Home Lives with:: Self, Adult Children Patient language and need for interpreter reviewed:: Yes Do you feel safe going back to the place where you live?: Yes            Criminal Activity/Legal Involvement Pertinent to Current Situation/Hospitalization: No - Comment as needed  Activities of Daily Living      Permission Sought/Granted Permission sought to share information with : Family Supports                Emotional Assessment Appearance:: Appears stated  age Attitude/Demeanor/Rapport: Engaged Affect (typically observed): Accepting Orientation: : Oriented to Self, Oriented to Place, Oriented to  Time, Oriented to Situation Alcohol / Substance Use: Not Applicable Psych Involvement: No (comment)  Admission diagnosis:  Hypertensive emergency [I16.1] Patient Active Problem List   Diagnosis Date Noted  . Hypertensive emergency 02/05/2019  . Need for vaccination against Streptococcus pneumoniae 11/12/2018  . Hypertensive urgency 10/15/2017  . Skin candidiasis 10/15/2017  . Venous (peripheral) insufficiency 08/06/2017  . Health care maintenance 01/01/2016  . Essential hypertension   . 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 Diabetic gastroparesis ([place]) 08/09/2015  . Accelerated hypertension   . Type 2 diabetes mellitus without complication, without long-term current use of insulin ([place])   . Low back pain 06/09/2014  . Obstructive sleep apnea of adult 01/16/2012  . GERD (gastroesophageal reflux disease) 01/16/2012  . Osteoarthritis of both knees 01/16/2012  . Hypercholesteremia 07/18/2011  . LEIOMYOMA, UTERUS 01/14/2007  . Morbid obesity ([place]) 07/03/2006  . Former smoker 07/03/2006  . Tension headache 07/03/2006  . HYPERTENSION, BENIGN SYSTEMIC 07/03/2006   PCP:  Matilde Haymaker, MD Pharmacy:   [facility] [address] [place]), [place] [address] [place] ([place]) [place] [address] Phone: [phone] Fax: [phone]  Social Determinants of Health (SDOH) Interventions    Readmission Risk Interventions No flowsheet data found.

## 2019-02-08 NOTE — Progress Notes (Signed)
Patient ID: [name], female   DOB: [birth year], 58 y.o.   MRN: [id number] [facility] Progress Note   Assessment/ Plan:    1.  Acute kidney injury on chronic kidney disease stage IV:  Appears to be hemodynamically mediated with hypertensive emergency/volume contraction from GI loss.  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  Noted effusion  - Discontinue lasix 120 mg IV every 8 hours - Plan for HD on 10/6 after tunneled catheter placement.  Consult IR.  NPO after midnight tonight.     2.  Hypertensive emergency: With acute kidney injury on chronic kidney disease, blood pressures better now with oral antihypertensive therapy  3.  Status post renal biopsy with left perinephric hematoma: - hemoglobin stable.  Assessed as small on initial imaging.  Defer repeat CT for now  4.  Diabetes mellitus: Hold/discontinue metformin indefinitely with her current GFR.  regimen per primary team   5.  Leukocytosis: Without fever or other focal infection.  Improved.  Antibiotics per primary team   6. Metabolic acidosis  - sodium bicarb - for HD  Subjective:   She does want dialysis when needed.  We discussed risks, benefits, and indications and she is willing to starat tomorrow.  She had 2.1 liters UOP over 10/4 with lasix 120 mg IV every 8 hours.  Also spoke with her daughter via phone.  She had some juice this AM.  Asked that she not eat her banana.   Review of systems: Denies shortness of breath or chest pain Denies nausea or vomiting   Objective:   BP (!) 169/74   Pulse 71   Temp 98.4 F (36.9 C) (Oral)   Resp 20   Wt 119 kg   SpO2 100%   BMI 38.74 kg/m   Intake/Output Summary (Last 24 hours) at 02/08/2019 0729 Last data filed at 02/08/2019 7209 Gross per 24 hour  Intake 601.19 ml  Output 2080 ml  Net -1478.81 ml   Weight change: -0.6 kg  Physical Exam: Gen:  adult female in bed in NAD at rest  CVS: S1S2; no rub Resp: clear to auscultation anteriorly; normal work of breathing at rest on room air  Abd: Soft, obese, nontender Ext: 1-2 edema extremities   Imaging: No results found.  Labs: BMET Recent Labs  Lab 02/04/19 2205 02/05/19 0306 02/05/19 1224 02/06/19 0250 02/07/19 0307 02/08/19 0454  NA 141 144  --  140 136 136  K 3.7 3.8  --  4.1 3.9 4.0  CL 109  --   --  112* 107 105  CO2 19*  --   --  18* 19* 18*  GLUCOSE 169*  --   --  91 113* 127*  BUN 39*  --   --  52* 61* 68*  CREATININE 5.07*  --  5.76* 6.60* 7.95* 9.25*  CALCIUM 9.3  --   --  8.0* 8.1* 8.4*  PHOS  --   --   --  5.6* 6.2* 6.6*   CBC Recent Labs  Lab 02/05/19 1224 02/06/19 0250 02/07/19 0846 02/08/19 0454  WBC 15.7* 13.5* 9.2 7.7  HGB 10.6* 8.8* 9.6* 9.6*  HCT 33.6* 27.9* 29.5* 28.9*  MCV 83.4 84.0 82.6 81.4  PLT 349 268 238 243    Medications:    . amLODipine  10 mg Oral Daily  . carvedilol  25 mg Oral BID WC  . Chlorhexidine Gluconate  Cloth  6 each Topical Daily  . cloNIDine  0.2 mg Oral TID  . heparin  5,000 Units Subcutaneous Q8H  . insulin aspart  0-15 Units Subcutaneous Q4H   Claudia Desanctis 02/08/2019, 7:29 AM

## 2019-02-08 NOTE — Progress Notes (Signed)
Family Medicine Teaching Service Daily Progress Note Intern Pager: [phone]  Patient name: [name] Medical record number: [id number] Date of birth: [birth year] Age: 57 y.o. Gender: female  Primary Care Provider: Matilde Haymaker, MD Consultants: CCM, Nephrology, IR Code Status: Full  Pt Overview and Major Events to Date:  10/02- Admitted to ICU for Hypertensive emergency  Assessment and Plan: 58yo F with h/o T2DM, GERD, HLD, HTN, CKD presented with persistent emesis, chest pain, and headache found to be in hypertensive emergency in ED in setting of missed doses of BP meds.  Hypertensive emergency-stable -stable, no c/o's visual disturbances, headaches, chest pain or SOB, bilateral lower extremity edema decreasing 2+ -Last BP 168/71, SBP as high as 190 -Continue current meds Amlodipine 10mg  OD, Carvedilol 25mg  BID, Clonidine 0.2mg  daily -Lasix discontinued 10/05 -Repeat BMP in am -Nephro follolowing   Perinephric hemorrhage-stable -Hbg today 9.6, no change from yesterday -Pt has no complaints of back pain, no hematuria -Monitor CBC daily -Per nephro, no need for repeat CT now   AKI on CKD IV- s/p renal biopsy -Cr 9.25, increased form 7.95 yesterday, Baseline 1.3 -Nephro following, plan for HD cath tomorrow by IR -NPO>midnight-Lasix discontinued -Consider holding Heparin -hold metformin -avoid nephrotoxic medications  Type 2 Diabetes-stable -Last CBG 94 -Diet controlled -Home med Metformin on hold -Moderate s/s for coverage, no insulin given overnight  Acute on chronic anemia of CKD-stable -Hbg9.6 -CBC daily   FEN/GI: -Carb Modified  PPx:  Hepatin [place]   Disposition: Home when medically cleared  Subjective:  Pt resting comfortable in bed.  No complaints voiced.  Denies chest pain, SOB, n/v abo pain. Denies any headaches, visual disturbances.  Eating well.  Passing gas   Objective: Temp:  [97.7 F (36.5 C)-98.7 F (37.1 C)] 97.7 F (36.5 C) (10/05  1200) Pulse Rate:  [69-84] 72 (10/05 1200) Resp:  [16-30] 23 (10/05 1200) BP: (97-190)/(55-103) 97/73 (10/05 1200) SpO2:  [100 %] 100 % (10/05 1200) Weight:  [119 kg] 119 kg (10/05 0500) Physical Exam: General: Alert, in no acute distress Cardiovascular: RRR, no murmurs appreciated, no gallops, distal pulses present Respiratory: CTAB, no crackles,no rhonchi appreciated,good cap refill Abdomen: Soft, non distended, non tender, BS present Extremities: Bilateral lower extremity 2+ edema,   Laboratory: Recent Labs  Lab 02/06/19 0250 02/07/19 0846 02/08/19 0454  WBC 13.5* 9.2 7.7  HGB 8.8* 9.6* 9.6*  HCT 27.9* 29.5* 28.9*  PLT 268 238 243   Recent Labs  Lab 02/05/19 0043  02/06/19 0250 02/07/19 0307 02/08/19 0454  NA  --    < > 140 136 136  K  --    < > 4.1 3.9 4.0  CL  --   --  112* 107 105  CO2  --   --  18* 19* 18*  BUN  --   --  52* 61* 68*  CREATININE  --    < > 6.60* 7.95* 9.25*  CALCIUM  --   --  8.0* 8.1* 8.4*  PROT 7.7  --   --   --   --   BILITOT 0.8  --   --   --   --   ALKPHOS 141*  --   --   --   --   ALT 21  --   --   --   --   AST 22  --   --   --   --   GLUCOSE  --   --  91 113* 127*   < > =  values in this interval not displayed.      Imaging/Diagnostic Tests:   Carollee Leitz, MD 02/08/2019, 2:20 PM PGY-1, [place] Intern pager: [phone], text pages welcome

## 2019-02-08 NOTE — Discharge Summary (Signed)
[facility] Discharge Summary  Patient name: [name] Medical record number: [id number] Date of birth: 1960-10-21 Age: 57 y.o. Gender: female Date of Admission: 02/04/2019  Date of Discharge: 02/18/2019 Admitting Physician: Marshell Garfinkel, MD  Primary Care Provider: Matilde Haymaker, MD Consultants: Critical Care, Nephrology  Indication for Hospitalization: Hypertensive crisis  Discharge Diagnoses/Problem List:    Disposition: Home  Discharge Condition: Stable  Discharge Exam:  General: 58 year old female, in no acute distress          Neck: Supple, no JVD appreciated Cardiovascular: Rate and rhythm, no murmurs appreciated Respiratory: Clear to auscultation bilaterally, no rhonchi, no crackles Gastrointestinal: Soft, nontender, nondisplaced, bowel sounds present MSK: All extremities, trace lower extremity edema  Derm: HD temp cath site dressing intact, no obvious signs of inflammation or infection. Neuro: Alert and orientated x4 Psych: Pleasant, cooperative and answers all questions appropriately.  Brief Hospital Course:  [name] is a 58 y.o. 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 Patient's presenting symptoms included chest pain and persistent emesis preventing her from tolerating PO medications at home. Patient's BP was initially 216/85 and she was started on a nitroglycerin IV and then transitioned to nicardipine drip. Non-contrast CT was done with no evidence of aortic hematoma nor aortic dilation. Patient was subsequently monitored in the ICU and was noted to have elevated Cr of 5.07 and BUN of 39. Troponins was intially elevated at 34 and subsequently down trended.  Echocardiogram showed LVEF 60-65%. Patient started on empiric  antibiotics including Vancomycin and cefepime on 10/2. 10/4-transferred out of the ICU. 10/6-HD cath placed.  In the following day patient had first dialysis treatment.  She continued to improve to other dialysis sessions. 10/14-AV fistula created.  And patient received last dialysis in hospital.  On the day of discharge vital signs stable.  Patient follow-up tomorrow for outpatient dialysis.  Schedule Monday Wednesday Fridays.    Issues for Follow Up:  1. Ensure patient has outpatient PT set up. 2. Monitor kidney function test. 3. Monitor blood pressure  Significant Procedures: HD tunneled cath AV fistula  Significant Labs and Imaging:  Recent Labs  Lab 02/14/19 0309 02/15/19 1654 02/17/19 0231  WBC 9.5 12.9* 10.5  HGB 9.1* 9.5* 9.0*  HCT 29.6* 30.2* 28.0*  PLT 152 65* 83*   Recent Labs  Lab 02/14/19 0309 02/15/19 0319 02/16/19 0253 02/17/19 0231 02/18/19 0233  NA 142 141 138 139 137  K 3.7 3.8 3.8 3.9 4.4  CL 104 106 100 104 99  CO2 '25 23 25 23 27  '$ GLUCOSE 78 98 95 91 159*  BUN 47* 53* 27* 35* 15  CREATININE 9.51* 10.11* 6.81* 7.60* 4.67*  CALCIUM 8.9 8.7* 8.8* 9.0 8.5*  MG  --  2.2  --   --   --   PHOS 5.7* 5.7* 4.4 5.3* 4.4  ALBUMIN 2.5* 2.6* 2.5* 2.7* 2.6*      Results/Tests Pending at Time of Discharge: \  Discharge Medications:  Allergies as of 02/18/2019      Reactions   Lisinopril Anaphylaxis, Other (See Comments)   angioedema   Jardiance [empagliflozin] Rash      Medication List    TAKE these medications   Accu-Chek Aviva Plus w/Device Kit Use to check sugar  three times a day   accu-chek soft touch lancets Use to check sugars three times a day   Accu-Chek Softclix Lancets lancets Use as instructed   acetaminophen 500 MG tablet Commonly known as: TYLENOL Take 1,000 mg by mouth every 6 (six) hours as needed for moderate pain or headache.   amLODipine 10 MG tablet Commonly known as: NORVASC TAKE 1 TABLET BY MOUTH ONCE DAILY    aspirin EC 325 MG tablet Take 325 mg by mouth daily.   atorvastatin 20 MG tablet Commonly known as: LIPITOR TAKE 1 TABLET BY MOUTH ONCE DAILY AT  6PM What changed: See the new instructions.   carvedilol 25 MG tablet Commonly known as: COREG Take 1 tablet (25 mg total) by mouth 2 (two) times daily with a meal.   cloNIDine 0.2 MG tablet Commonly known as: CATAPRES Take 0.2 mg by mouth 3 (three) times daily.   ferrous sulfate 325 (65 FE) MG tablet Take 325 mg by mouth daily with breakfast.   furosemide 40 MG tablet Commonly known as: LASIX Take 40 mg by mouth 2 (two) times daily.   glucose blood test strip Commonly known as: Accu-Chek Aviva Plus Use as instructed   polyethylene glycol 17 g packet Commonly known as: MIRALAX / GLYCOLAX Take 17 g by mouth daily as needed for moderate constipation.   potassium chloride SA 20 MEQ tablet Commonly known as: KLOR-CON Take 20 mEq by mouth daily.   traMADol 50 MG tablet Commonly known as: Ultram Take 1 tablet (50 mg total) by mouth every 6 (six) hours as needed. What changed: when to take this   Vitamin D (Ergocalciferol) 1.25 MG (50000 UT) Caps capsule Commonly known as: DRISDOL Take 50,000 Units by mouth every Monday.       Discharge Instructions: Please refer to Patient Instructions section of EMR for full details.  Patient was counseled important signs and symptoms that should prompt return to medical care, changes in medications, dietary instructions, activity restrictions, and follow up appointments.   Follow-Up Appointments: Follow-up Information    Waynetta Sandy, MD In 4 weeks.   Specialties: Vascular Surgery, Cardiology Why: Office will call you to arrange your appt (sent) Contact information: [place] [address] [phone]        [facility]. Go on 02/23/2019.   Why: apponint at 11:10am Contact information: [place]          Carollee Leitz, MD 02/20/2019, 10:40 PM PGY-1, [place]

## 2019-02-09 ENCOUNTER — Inpatient Hospital Stay (HOSPITAL_COMMUNITY): Payer: BC Managed Care – PPO

## 2019-02-09 DIAGNOSIS — N179 Acute kidney failure, unspecified: Secondary | ICD-10-CM

## 2019-02-09 DIAGNOSIS — I161 Hypertensive emergency: Secondary | ICD-10-CM | POA: Diagnosis not present

## 2019-02-09 DIAGNOSIS — E1122 Type 2 diabetes mellitus with diabetic chronic kidney disease: Secondary | ICD-10-CM | POA: Diagnosis not present

## 2019-02-09 DIAGNOSIS — N185 Chronic kidney disease, stage 5: Secondary | ICD-10-CM

## 2019-02-09 DIAGNOSIS — I120 Hypertensive chronic kidney disease with stage 5 chronic kidney disease or end stage renal disease: Secondary | ICD-10-CM | POA: Diagnosis not present

## 2019-02-09 HISTORY — PX: IR US GUIDE VASC ACCESS RIGHT: IMG2390

## 2019-02-09 HISTORY — PX: IR FLUORO GUIDE CV LINE RIGHT: IMG2283

## 2019-02-09 LAB — RENAL FUNCTION PANEL
Albumin: 2.5 g/dL — ABNORMAL LOW (ref 3.5–5.0)
Anion gap: 13 (ref 5–15)
BUN: 70 mg/dL — ABNORMAL HIGH (ref 6–20)
CO2: 18 mmol/L — ABNORMAL LOW (ref 22–32)
Calcium: 8.6 mg/dL — ABNORMAL LOW (ref 8.9–10.3)
Chloride: 108 mmol/L (ref 98–111)
Creatinine, Ser: 10.23 mg/dL — ABNORMAL HIGH (ref 0.44–1.00)
GFR calc Af Amer: 4 mL/min — ABNORMAL LOW (ref >60)
GFR calc non Af Amer: 4 mL/min — ABNORMAL LOW (ref >60)
Glucose, Bld: 108 mg/dL — ABNORMAL HIGH (ref 70–99)
Phosphorus: 7.4 mg/dL — ABNORMAL HIGH (ref 2.5–4.6)
Potassium: 4 mmol/L (ref 3.5–5.1)
Sodium: 139 mmol/L (ref 135–145)

## 2019-02-09 LAB — CBC
HCT: 30 % — ABNORMAL LOW (ref 36.0–46.0)
Hemoglobin: 9.7 g/dL — ABNORMAL LOW (ref 12.0–15.0)
MCH: 26.4 pg (ref 26.0–34.0)
MCHC: 32.3 g/dL (ref 30.0–36.0)
MCV: 81.5 fL (ref 80.0–100.0)
Platelets: 266 10*3/uL (ref 150–400)
RBC: 3.68 MIL/uL — ABNORMAL LOW (ref 3.87–5.11)
RDW: 15.4 % (ref 11.5–15.5)
WBC: 8.5 10*3/uL (ref 4.0–10.5)
nRBC: 0 % (ref 0.0–0.2)

## 2019-02-09 LAB — GLUCOSE, CAPILLARY
Glucose-Capillary: 100 mg/dL — ABNORMAL HIGH (ref 70–99)
Glucose-Capillary: 103 mg/dL — ABNORMAL HIGH (ref 70–99)
Glucose-Capillary: 104 mg/dL — ABNORMAL HIGH (ref 70–99)
Glucose-Capillary: 108 mg/dL — ABNORMAL HIGH (ref 70–99)
Glucose-Capillary: 114 mg/dL — ABNORMAL HIGH (ref 70–99)
Glucose-Capillary: 117 mg/dL — ABNORMAL HIGH (ref 70–99)
Glucose-Capillary: 89 mg/dL (ref 70–99)

## 2019-02-09 LAB — IRON AND TIBC
Iron: 40 ug/dL (ref 28–170)
Saturation Ratios: 17 % (ref 10.4–31.8)
TIBC: 230 ug/dL — ABNORMAL LOW (ref 250–450)
UIBC: 190 ug/dL

## 2019-02-09 LAB — FERRITIN: Ferritin: 330 ng/mL — ABNORMAL HIGH (ref 11–307)

## 2019-02-09 IMAGING — US IR FLUORO GUIDE CV LINE*R*
2 series · 3 of 3 positions shown · non-contrast
Comparison: none

INDICATION: 58-year-old female with a history of renal failure

[Series 1: ir fluoro guide cv line*right* · 2 of 2 slices shown]
[im 1/2]
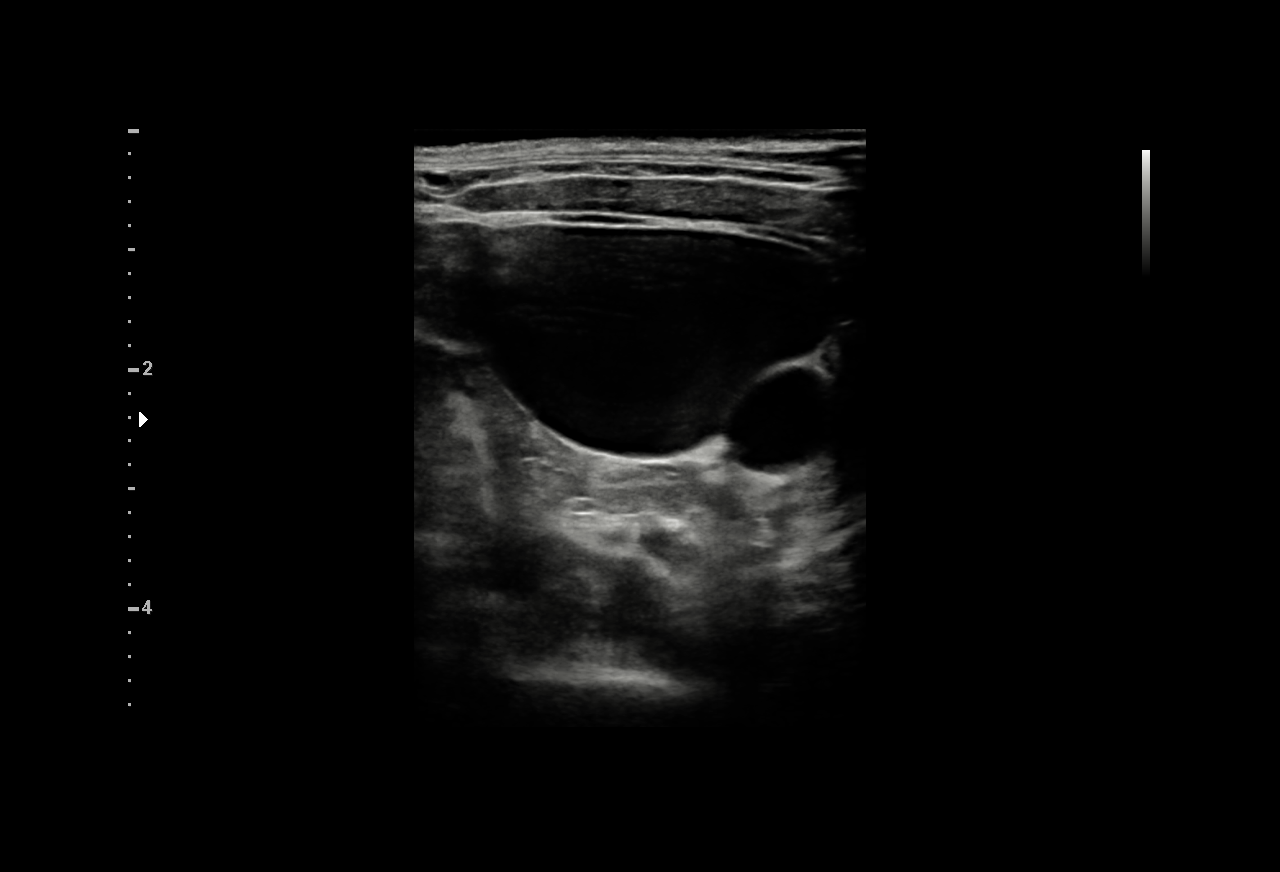
[im 2/2]
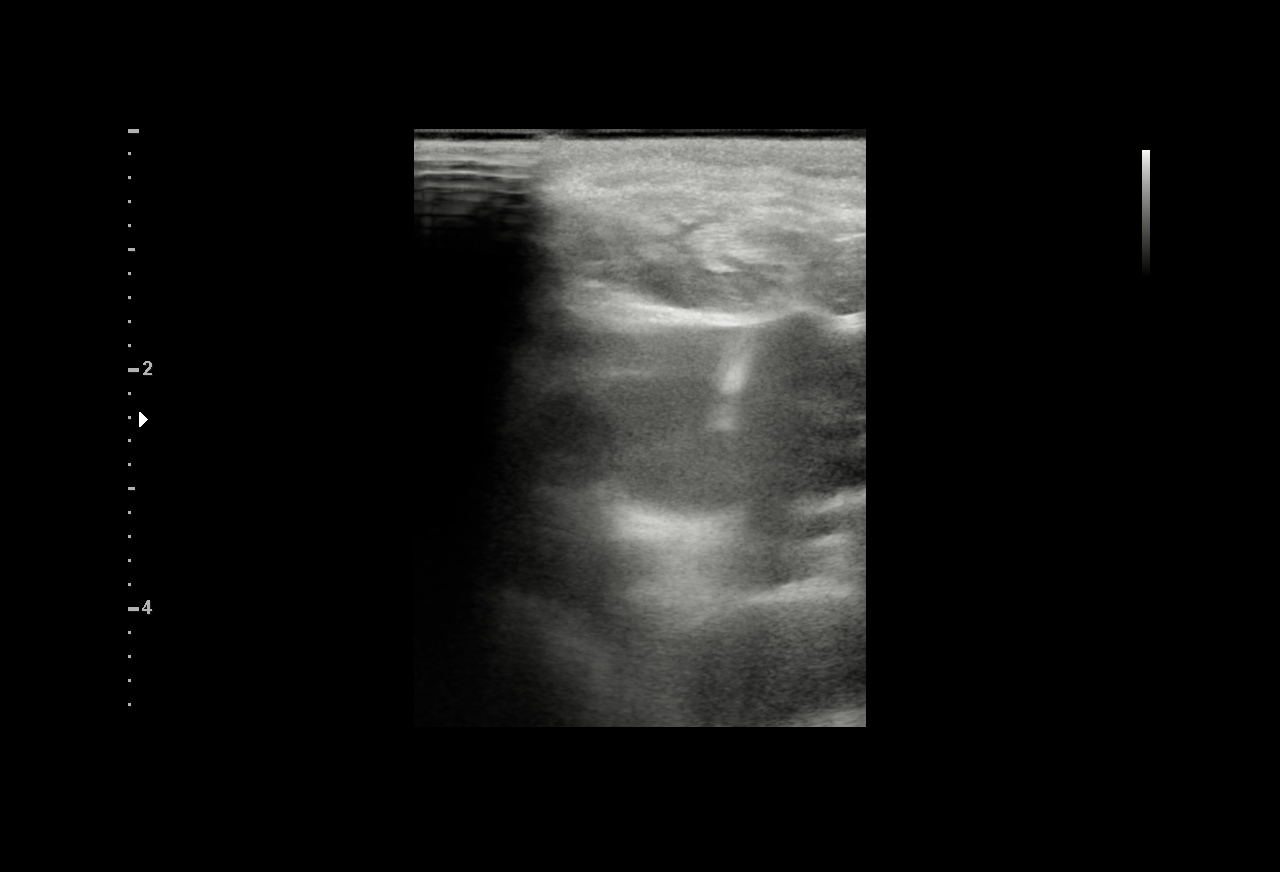

[Series 1: fl(-)  angio sharp · 1 of 1 slices shown]
[im 1/1]
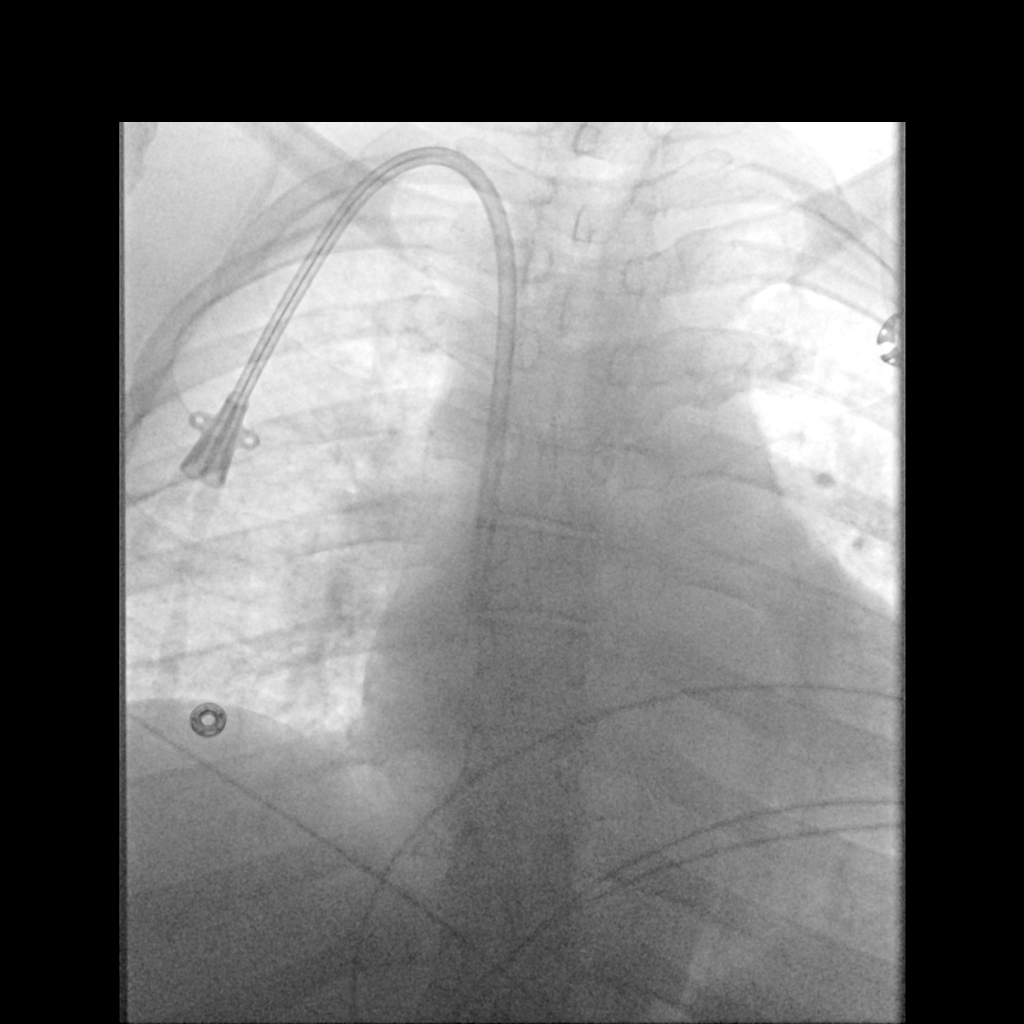

[3 of 3 positions shown; findings below may reference images not displayed]

EXAM:
IMAGE GUIDED TUNNELED HEMODIALYSIS CATHETER PLACEMENT

MEDICATIONS:
2 g Ancef; The antibiotic was administered within an appropriate
time interval prior to skin puncture.

ANESTHESIA/SEDATION:
Moderate (conscious) sedation was employed during this procedure. A
total of Versed 1.5 mg and Fentanyl 50 mcg was administered
intravenously.

Moderate Sedation Time: 10 minutes. The patient's level of
consciousness and vital signs were monitored continuously by
radiology nursing throughout the procedure under my direct
supervision.

FLUOROSCOPY TIME:  Fluoroscopy Time: 0 minutes 6 seconds (1.0 mGy).

COMPLICATIONS:
None



After creating a small venotomy incision, a micropuncture kit was
utilized to access the right internal jugular vein under direct,
real-time ultrasound guidance after the overlying soft tissues were
anesthetized with 1% lidocaine with epinephrine. Ultrasound image
documentation was performed. The microwire was marked to measure
appropriate internal catheter length. External tunneled length was
estimated. A total tip to cuff length of 19 cm was selected.

Skin and subcutaneous tissues of chest wall below the clavicle were
generously infiltrated with 1% lidocaine for local anesthesia. A
small stab incision was made with 11 blade scalpel. The selected
hemodialysis catheter was tunneled in a retrograde fashion from the
anterior chest wall to the venotomy incision.

A guidewire was advanced to the level of the IVC and the
micropuncture sheath was exchanged for a peel-away sheath.

The catheter was then placed through the peel-away sheath with tips
ultimately positioned within the superior aspect of the right
atrium. Final catheter positioning was confirmed and documented with
a spot radiographic image. The catheter aspirates and flushes
normally. The catheter was flushed with appropriate volume heparin
dwells.

The catheter exit site was secured with a 0-Prolene retention
suture. The venotomy incision was closed Derma bond and sterile
dressing. Dressings were applied at the chest wall.

Patient tolerated the procedure well and remained hemodynamically
stable throughout.

No complications were encountered and no significant blood loss
encountered.
IMPRESSION: Status post right IJ tunneled hemodialysis catheter placement.
Catheter ready for use

## 2019-02-09 MED ORDER — POLYETHYLENE GLYCOL 3350 17 G PO PACK
17.0000 g | PACK | Freq: Every day | ORAL | Status: DC
  Administered 2019-02-10 – 2019-02-11 (×2): 17 g via ORAL
  Filled 2019-02-09 (×2): qty 1

## 2019-02-09 MED ORDER — MIDAZOLAM HCL 2 MG/2ML IJ SOLN
INTRAMUSCULAR | Status: AC
  Filled 2019-02-09: qty 2

## 2019-02-09 MED ORDER — CEFAZOLIN SODIUM-DEXTROSE 2-4 GM/100ML-% IV SOLN
INTRAVENOUS | Status: AC
  Filled 2019-02-09: qty 100

## 2019-02-09 MED ORDER — LIDOCAINE HCL (PF) 1 % IJ SOLN
5.0000 mL | INTRAMUSCULAR | Status: DC | PRN
  Filled 2019-02-09: qty 5

## 2019-02-09 MED ORDER — LIDOCAINE HCL 1 % IJ SOLN
INTRAMUSCULAR | Status: AC | PRN
  Administered 2019-02-09: 10 mL

## 2019-02-09 MED ORDER — MIDAZOLAM HCL 2 MG/2ML IJ SOLN
INTRAMUSCULAR | Status: AC | PRN
  Administered 2019-02-09: 1 mg via INTRAVENOUS
  Administered 2019-02-09: 0.5 mg via INTRAVENOUS

## 2019-02-09 MED ORDER — LIDOCAINE-PRILOCAINE 2.5-2.5 % EX CREA
1.0000 "application " | TOPICAL_CREAM | CUTANEOUS | Status: DC | PRN
  Filled 2019-02-09: qty 5

## 2019-02-09 MED ORDER — FENTANYL CITRATE (PF) 100 MCG/2ML IJ SOLN
INTRAMUSCULAR | Status: AC
  Filled 2019-02-09: qty 2

## 2019-02-09 MED ORDER — GELATIN ABSORBABLE 12-7 MM EX MISC
CUTANEOUS | Status: AC
  Filled 2019-02-09: qty 1

## 2019-02-09 MED ORDER — GELATIN ABSORBABLE 12-7 MM EX MISC
CUTANEOUS | Status: AC | PRN
  Administered 2019-02-09: 1 via TOPICAL

## 2019-02-09 MED ORDER — SODIUM BICARBONATE 650 MG PO TABS
650.0000 mg | ORAL_TABLET | Freq: Once | ORAL | Status: AC
  Administered 2019-02-09: 650 mg via ORAL
  Filled 2019-02-09: qty 1

## 2019-02-09 MED ORDER — LIDOCAINE HCL 1 % IJ SOLN
INTRAMUSCULAR | Status: AC
  Filled 2019-02-09: qty 20

## 2019-02-09 MED ORDER — CEFAZOLIN SODIUM-DEXTROSE 2-4 GM/100ML-% IV SOLN
INTRAVENOUS | Status: AC | PRN
  Administered 2019-02-09: 2 g via INTRAVENOUS

## 2019-02-09 MED ORDER — SODIUM CHLORIDE 0.9 % IV SOLN: 100.0000 mL | INTRAVENOUS | Status: DC | PRN

## 2019-02-09 MED ORDER — DARBEPOETIN ALFA 40 MCG/0.4ML IJ SOSY
40.0000 ug | PREFILLED_SYRINGE | INTRAMUSCULAR | Status: DC
  Filled 2019-02-09: qty 0.4

## 2019-02-09 MED ORDER — SODIUM CHLORIDE 0.9 % IV SOLN
INTRAVENOUS | Status: AC | PRN
  Administered 2019-02-09: 10 mL/h via INTRAVENOUS

## 2019-02-09 MED ORDER — HEPARIN SODIUM (PORCINE) 1000 UNIT/ML IJ SOLN
INTRAMUSCULAR | Status: AC
  Filled 2019-02-09: qty 1

## 2019-02-09 MED ORDER — HEPARIN SODIUM (PORCINE) 1000 UNIT/ML IJ SOLN
INTRAMUSCULAR | Status: AC | PRN
  Administered 2019-02-09: 3.2 mL via INTRAVENOUS

## 2019-02-09 MED ORDER — HEPARIN SODIUM (PORCINE) 1000 UNIT/ML DIALYSIS
1000.0000 [IU] | INTRAMUSCULAR | Status: DC | PRN
  Administered 2019-02-10: 03:00:00 1000 [IU] via INTRAVENOUS_CENTRAL
  Filled 2019-02-09: qty 1

## 2019-02-09 MED ORDER — CHLORHEXIDINE GLUCONATE CLOTH 2 % EX PADS
6.0000 | MEDICATED_PAD | Freq: Every day | CUTANEOUS | Status: DC
  Administered 2019-02-11: 6 via TOPICAL

## 2019-02-09 MED ORDER — PENTAFLUOROPROP-TETRAFLUOROETH EX AERO: 1.0000 "application " | INHALATION_SPRAY | CUTANEOUS | Status: DC | PRN

## 2019-02-09 MED ORDER — FENTANYL CITRATE (PF) 100 MCG/2ML IJ SOLN
INTRAMUSCULAR | Status: AC | PRN
  Administered 2019-02-09: 50 ug via INTRAVENOUS

## 2019-02-09 MED ORDER — ALTEPLASE 2 MG IJ SOLR
2.0000 mg | Freq: Once | INTRAMUSCULAR | Status: DC | PRN
  Filled 2019-02-09: qty 2

## 2019-02-09 NOTE — Procedures (Signed)
Interventional Radiology Procedure Note  Procedure: Placement of a right IJ approach tunneled 19cm tip to cuff HD catheter.  Tip is positioned at the superior cavoatrial junction and catheter is ready for immediate use.  Complications: None Recommendations:  - Ok to use - Do not submerge - Routine line care   Signed,  Dulcy Fanny. Earleen Newport, DO

## 2019-02-09 NOTE — Progress Notes (Signed)
Pt being transported to IR.

## 2019-02-09 NOTE — Plan of Care (Signed)
  Problem: Health Behavior/Discharge Planning: Goal: Ability to manage health-related needs will improve Outcome: Progressing   Problem: Clinical Measurements: Goal: Ability to maintain clinical measurements within normal limits will improve Outcome: Progressing Goal: Will remain free from infection Outcome: Progressing Goal: Diagnostic test results will improve Outcome: Progressing Goal: Cardiovascular complication will be avoided Outcome: Progressing   Problem: Activity: Goal: Risk for activity intolerance will decrease Outcome: Progressing   Problem: Nutrition: Goal: Adequate nutrition will be maintained Outcome: Progressing   Problem: Elimination: Goal: Will not experience complications related to bowel motility Outcome: Progressing   Problem: Safety: Goal: Ability to remain free from injury will improve Outcome: Progressing   Problem: Skin Integrity: Goal: Risk for impaired skin integrity will decrease Outcome: Progressing   Problem: Education: Goal: Knowledge of General Education information will improve Description: Including pain rating scale, medication(s)/side effects and non-pharmacologic comfort measures Outcome: Adequate for Discharge   Problem: Clinical Measurements: Goal: Respiratory complications will improve Outcome: Adequate for Discharge   Problem: Coping: Goal: Level of anxiety will decrease Outcome: Adequate for Discharge   Problem: Elimination: Goal: Will not experience complications related to urinary retention Outcome: Adequate for Discharge   Problem: Pain Managment: Goal: General experience of comfort will improve Outcome: Adequate for Discharge

## 2019-02-09 NOTE — Progress Notes (Signed)
Family Medicine Teaching Service Daily Progress Note Intern Pager: [phone]  Patient name: [name] Medical record number: [id number] Date of birth: 1961/02/28 Age: 58 y.o. Gender: female  Primary Care Provider: Matilde Haymaker, MD Consultants: CCM, Nephrology, IR Code Status: Full  Pt Overview and Major Events to Date:  10/02- Admitted to ICU for Hypertensive emergency 10/06- HD cath placement  Assessment and Plan: 58yo F with h/o T2DM, GERD, HLD, HTN, CKD presented with persistent emesis, chest pain, and headache found to be in hypertensive emergency in ED in setting of missed doses of BP meds.  Hypertensive emergency-stable -stable, no c/o's visual disturbances, headaches, chest pain or SOB, bilateral lower extremity 2+ edema, pt states that the swelling is decreasing -Last BP 162/65, SBP as high as 162 overnight -Continue current meds Amlodipine 10mg  OD, Carvedilol 25mg  BID, Clonidine 0.2mg  daily -Lasix discontinued 10/05 -Repeat BMP in am -Nephro follolowing   Perinephric hemorrhage-stable -Hbg today 9.7 today -Pt has no complaints of back pain, no hematuria -Monitor CBC daily -Per nephro, no need for repeat CT now -Renal u/s shows echogenic kidneys consistent with medical renal disease and no hydronephrosis. Trace of perinephric fluid at the lower pole right kidney. No left perinephric fluid collections as previously noted on previous CT  AKI on CKD IV- s/p renal biopsy -Cr 10.23, increased form 9.25 yesterday, Baseline 1.3 -Nephro following, plan for HD cath today by IR and dialysis 10/07 -NPO -hold metformin -avoid nephrotoxic medications -output overnight 956mls, 1875 ml in 24hrs  Type 2 Diabetes-stable  -Last CBG 104 -Diet controlled -Home med Metformin on hold -Moderate s/s for coverage, no insulin given overnight  Acute on chronic anemia of CKD-stable -Hbg 9.7 -CBC daily -Iron studies- TIBC 230, Ferritin 330 -Per nephro start ESA (Aranesp 68mcg  IV)   FEN/GI: -NPO for procedure  -Carb Modified after procedure -N/S KVO  PPx:  Hepatin    Disposition: Home when medically cleared  Subjective: Pt sitting in chair having a sponge bath. A little nervous for procedure today.  Denies chest pain, SOB, n/v abo pain. Denies any headaches, visual disturbances.  NPO since midnight.  Passing gas and voiding well.  Objective: Temp:  [97.7 F (36.5 C)-98.7 F (37.1 C)] 98.5 F (36.9 C) (10/06 0336) Pulse Rate:  [68-84] 73 (10/06 0500) Resp:  [17-28] 24 (10/06 0500) BP: (97-190)/(49-125) 162/75 (10/06 0500) SpO2:  [100 %] 100 % (10/06 0500)   Physical Exam: General: Alert and in no acute distress Cardiovascular: RRR, no murmurs appreciated, no crackles, no rhonchi, distal pulses present Respiratory: CTAB, no crackles, no rhonchi appreciated, good cap refill Abdomen: Soft, non distended, non tender, BS present Extremities: strength 5/5 bilaterally and equal, pulses present, bilateral lower extremity 2+ edema  Laboratory: Recent Labs  Lab 02/07/19 0846 02/08/19 0454 02/09/19 0334  WBC 9.2 7.7 8.5  HGB 9.6* 9.6* 9.7*  HCT 29.5* 28.9* 30.0*  PLT 238 243 266   Recent Labs  Lab 02/05/19 0043  02/07/19 0307 02/08/19 0454 02/09/19 0334  NA  --    < > 136 136 139  K  --    < > 3.9 4.0 4.0  CL  --    < > 107 105 108  CO2  --    < > 19* 18* 18*  BUN  --    < > 61* 68* 70*  CREATININE  --    < > 7.95* 9.25* 10.23*  CALCIUM  --    < > 8.1* 8.4* 8.6*  PROT  7.7  --   --   --   --   BILITOT 0.8  --   --   --   --   ALKPHOS 141*  --   --   --   --   ALT 21  --   --   --   --   AST 22  --   --   --   --   GLUCOSE  --    < > 113* 127* 108*   < > = values in this interval not displayed.      Imaging/Diagnostic Tests: US Renal  Result Date: 02/08/2019 CLINICAL DATA:  Assess for perinephric hematoma EXAM: RENAL / URINARY TRACT ULTRASOUND COMPLETE COMPARISON:  CT 02/05/2019 renal ultrasound 12/02/2018 FINDINGS: Right Kidney:  Renal measurements: 11 x 6 x 6.4 cm = volume: 221.7 mL. Increased echogenicity. No hydronephrosis. Trace perinephric fluid near the lower pole. Previously noted cyst in the right kidney on ultrasound not confidently identified today. Left Kidney: Renal measurements: 10.3 x 6.1 x 5.7 cm = volume: 184.3 mL. Increased cortical echogenicity. No hydronephrosis. Previously identified complex cyst in the mid left kidney is not well demonstrated today. Bladder: Appears normal for degree of bladder distention. IMPRESSION: 1. Echogenic kidneys consistent with medical renal disease. No hydronephrosis 2. Trace perinephric fluid at the lower pole right kidney. No appreciable left perinephric fluid collections as noted on the previous CT. Electronically Signed   By: Donavan Foil M.D.   On: 02/08/2019 23:08    Carollee Leitz, MD 02/09/2019, 7:38 AM PGY-1, [place] Intern pager: [phone], text pages welcome

## 2019-02-09 NOTE — Progress Notes (Signed)
Patient ID: [name], female   DOB: [birth year], 58 y.o.   MRN: [id number] [facility] Progress Note   Assessment/ Plan:    1.  Acute kidney injury on chronic kidney disease stage IV:  Appears to be hemodynamically mediated with hypertensive emergency/volume contraction from GI loss.  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  Noted effusion.  Lasix was discontinued.  Renal ultrasound on 10/5 identifies only trace fluid at the lower pole of the kidney - Plan for HD on 10/6 after tunneled catheter placement.  Appreciate IR - Remain NPO until catheter later today  - Plan for HD on 10/7, as well.  Assess dialysis needs daily - Check post-void and place foley if indicated  2.  Hypertensive emergency: With acute kidney injury on chronic kidney disease, blood pressures better now with oral antihypertensive therapy.  Avoid hypotension.  Continue current regimen for now.  Starting HD.    3.  Status post renal biopsy with left perinephric hematoma: - hemoglobin stable.  Assessed as small on initial imaging.  - Renal ultrasound on 10/5 identifies only trace fluid at the lower pole of the kidney  4.  Diabetes mellitus: Hold/discontinue metformin indefinitely with her current GFR.  regimen per primary team   5.  Leukocytosis: Without fever or other focal infection.  resolved.  Antibiotics per primary team    6. Metabolic acidosis  - has been on sodium bicarb - transition to HD for management.   7. Anemia secondary in part to CKD.  Hb stable.  Add on iron profile.  Start ESA.  Subjective:   Has been NPO for tunneled catheter today.  Continued rise in creatinine.  She had 1.1 liters UOP over 10/5.  She confirms no history of malignancy.  Bed was soaked with urine per nursing report.  Feels ok today.  We discussed plans for HD and tunneled catheter again and she had no further  questions.    Review of systems: Denies shortness of breath or chest pain Denies nausea or vomiting  Has been NPO.  Objective:   BP (!) 162/75   Pulse 73   Temp 98.5 F (36.9 C) (Oral)   Resp (!) 24   Wt 119 kg   SpO2 100%   BMI 38.74 kg/m   Intake/Output Summary (Last 24 hours) at 02/09/2019 0545 Last data filed at 02/09/2019 0000 Gross per 24 hour  Intake 292.71 ml  Output 1500 ml  Net -1207.29 ml   Weight change:   Physical Exam: Gen: adult female in bed in NAD at rest  CVS: S1S2; no rub Resp: clear to auscultation anteriorly; normal work of breathing at rest on room air  Abd: Soft, obese, nontender Ext: trace to 1+ edema lower extremities  Neuro alert and oriented x 3; provides hx Psych normal mood and affect  GU - purewick  Imaging: US Renal  Result Date: 02/08/2019 CLINICAL DATA:  Assess for perinephric hematoma EXAM: RENAL / URINARY TRACT ULTRASOUND COMPLETE COMPARISON:  CT 02/05/2019 renal ultrasound 12/02/2018 FINDINGS: Right Kidney: Renal measurements: 11 x 6 x 6.4 cm = volume: 221.7 mL. Increased echogenicity. No hydronephrosis. Trace perinephric fluid near the lower pole. Previously noted cyst in the right kidney on ultrasound not confidently identified today. Left Kidney: Renal measurements: 10.3 x 6.1 x 5.7 cm = volume: 184.3 mL. Increased cortical echogenicity. No hydronephrosis. Previously identified complex cyst in the mid left kidney  is not well demonstrated today. Bladder: Appears normal for degree of bladder distention. IMPRESSION: 1. Echogenic kidneys consistent with medical renal disease. No hydronephrosis 2. Trace perinephric fluid at the lower pole right kidney. No appreciable left perinephric fluid collections as noted on the previous CT. Electronically Signed   By: Donavan Foil M.D.   On: 02/08/2019 23:08    Labs: BMET Recent Labs  Lab 02/04/19 2205 02/05/19 0306 02/05/19 1224 02/06/19 0250 02/07/19 0307 02/08/19 0454 02/09/19 0334  NA  141 144  --  140 136 136 139  K 3.7 3.8  --  4.1 3.9 4.0 4.0  CL 109  --   --  112* 107 105 108  CO2 19*  --   --  18* 19* 18* 18*  GLUCOSE 169*  --   --  91 113* 127* 108*  BUN 39*  --   --  52* 61* 68* 70*  CREATININE 5.07*  --  5.76* 6.60* 7.95* 9.25* 10.23*  CALCIUM 9.3  --   --  8.0* 8.1* 8.4* 8.6*  PHOS  --   --   --  5.6* 6.2* 6.6* 7.4*   CBC Recent Labs  Lab 02/06/19 0250 02/07/19 0846 02/08/19 0454 02/09/19 0334  WBC 13.5* 9.2 7.7 8.5  HGB 8.8* 9.6* 9.6* 9.7*  HCT 27.9* 29.5* 28.9* 30.0*  MCV 84.0 82.6 81.4 81.5  PLT 268 238 243 266    Medications:    . amLODipine  10 mg Oral Daily  . carvedilol  25 mg Oral BID WC  . Chlorhexidine Gluconate Cloth  6 each Topical Daily  . Chlorhexidine Gluconate Cloth  6 each Topical Q0600  . cloNIDine  0.2 mg Oral TID  . heparin  5,000 Units Subcutaneous Q8H  . insulin aspart  0-15 Units Subcutaneous Q4H  . sodium bicarbonate  650 mg Oral BID   Claudia Desanctis 02/09/2019, 5:45 AM

## 2019-02-10 ENCOUNTER — Encounter (HOSPITAL_COMMUNITY): Payer: Self-pay | Admitting: Interventional Radiology

## 2019-02-10 DIAGNOSIS — E119 Type 2 diabetes mellitus without complications: Secondary | ICD-10-CM | POA: Diagnosis not present

## 2019-02-10 DIAGNOSIS — I161 Hypertensive emergency: Secondary | ICD-10-CM | POA: Diagnosis not present

## 2019-02-10 DIAGNOSIS — D72829 Elevated white blood cell count, unspecified: Secondary | ICD-10-CM | POA: Diagnosis not present

## 2019-02-10 DIAGNOSIS — N179 Acute kidney failure, unspecified: Secondary | ICD-10-CM | POA: Diagnosis not present

## 2019-02-10 LAB — RENAL FUNCTION PANEL
Albumin: 2.7 g/dL — ABNORMAL LOW (ref 3.5–5.0)
Anion gap: 12 (ref 5–15)
BUN: 51 mg/dL — ABNORMAL HIGH (ref 6–20)
CO2: 21 mmol/L — ABNORMAL LOW (ref 22–32)
Calcium: 8.5 mg/dL — ABNORMAL LOW (ref 8.9–10.3)
Chloride: 105 mmol/L (ref 98–111)
Creatinine, Ser: 8.26 mg/dL — ABNORMAL HIGH (ref 0.44–1.00)
GFR calc Af Amer: 6 mL/min — ABNORMAL LOW (ref >60)
GFR calc non Af Amer: 5 mL/min — ABNORMAL LOW (ref >60)
Glucose, Bld: 100 mg/dL — ABNORMAL HIGH (ref 70–99)
Phosphorus: 5.3 mg/dL — ABNORMAL HIGH (ref 2.5–4.6)
Potassium: 3.7 mmol/L (ref 3.5–5.1)
Sodium: 138 mmol/L (ref 135–145)

## 2019-02-10 LAB — CULTURE, BLOOD (ROUTINE X 2)
Culture: NO GROWTH
Culture: NO GROWTH
Special Requests: ADEQUATE

## 2019-02-10 LAB — CBC WITH DIFFERENTIAL/PLATELET
Abs Immature Granulocytes: 0.12 10*3/uL — ABNORMAL HIGH (ref 0.00–0.07)
Basophils Absolute: 0 10*3/uL (ref 0.0–0.1)
Basophils Relative: 0 %
Eosinophils Absolute: 0.1 10*3/uL (ref 0.0–0.5)
Eosinophils Relative: 1 %
HCT: 30.5 % — ABNORMAL LOW (ref 36.0–46.0)
Hemoglobin: 10 g/dL — ABNORMAL LOW (ref 12.0–15.0)
Immature Granulocytes: 1 %
Lymphocytes Relative: 6 %
Lymphs Abs: 1 10*3/uL (ref 0.7–4.0)
MCH: 26.5 pg (ref 26.0–34.0)
MCHC: 32.8 g/dL (ref 30.0–36.0)
MCV: 80.9 fL (ref 80.0–100.0)
Monocytes Absolute: 0.8 10*3/uL (ref 0.1–1.0)
Monocytes Relative: 5 %
Neutro Abs: 14.4 10*3/uL — ABNORMAL HIGH (ref 1.7–7.7)
Neutrophils Relative %: 87 %
Platelets: 298 10*3/uL (ref 150–400)
RBC: 3.77 MIL/uL — ABNORMAL LOW (ref 3.87–5.11)
RDW: 15.5 % (ref 11.5–15.5)
WBC: 16.5 10*3/uL — ABNORMAL HIGH (ref 4.0–10.5)
nRBC: 0 % (ref 0.0–0.2)

## 2019-02-10 LAB — GLUCOSE, CAPILLARY
Glucose-Capillary: 116 mg/dL — ABNORMAL HIGH (ref 70–99)
Glucose-Capillary: 124 mg/dL — ABNORMAL HIGH (ref 70–99)
Glucose-Capillary: 139 mg/dL — ABNORMAL HIGH (ref 70–99)
Glucose-Capillary: 93 mg/dL (ref 70–99)
Glucose-Capillary: 94 mg/dL (ref 70–99)
Glucose-Capillary: 96 mg/dL (ref 70–99)

## 2019-02-10 LAB — HEPATITIS B SURFACE ANTIGEN: Hepatitis B Surface Ag: NONREACTIVE

## 2019-02-10 LAB — HEPATITIS B CORE ANTIBODY, TOTAL: Hep B Core Total Ab: NONREACTIVE

## 2019-02-10 MED ORDER — SODIUM CHLORIDE 0.9 % IV SOLN
510.0000 mg | Freq: Once | INTRAVENOUS | Status: AC
  Administered 2019-02-10: 09:00:00 510 mg via INTRAVENOUS
  Filled 2019-02-10: qty 17

## 2019-02-10 MED ORDER — DARBEPOETIN ALFA 40 MCG/0.4ML IJ SOSY
40.0000 ug | PREFILLED_SYRINGE | Freq: Once | INTRAMUSCULAR | Status: AC
  Administered 2019-02-10: 40 ug via INTRAVENOUS
  Filled 2019-02-10: qty 0.4

## 2019-02-10 MED ORDER — CHLORHEXIDINE GLUCONATE CLOTH 2 % EX PADS: 6.0000 | MEDICATED_PAD | Freq: Every day | CUTANEOUS | Status: DC

## 2019-02-10 MED ORDER — SENNA 8.6 MG PO TABS
1.0000 | ORAL_TABLET | Freq: Every day | ORAL | Status: DC
  Administered 2019-02-10 – 2019-02-18 (×8): 8.6 mg via ORAL
  Filled 2019-02-10 (×9): qty 1

## 2019-02-10 MED ORDER — SODIUM CHLORIDE 0.9 % IV SOLN: 125.0000 mg | Freq: Once | INTRAVENOUS | Status: DC

## 2019-02-10 NOTE — Progress Notes (Signed)
Family Medicine Teaching Service Daily Progress Note Intern Pager: [phone]  Patient name: [name] Medical record number: [id number] Date of birth: 03-28-1961 Age: 58 y.o. Gender: female  Primary Care Provider: Matilde Haymaker, MD Consultants: CCM, Nephrology, IR Code Status: Full  Pt Overview and Major Events to Date:  10/02- Admitted to ICU for Hypertensive emergency 10/06- HD cath placement 10/07-HD   Assessment and Plan: 58yo F with h/o T2DM, GERD, HLD, HTN, CKD presented with persistent emesis, chest pain, and headache found to be in hypertensive emergency in ED in setting of missed doses of BP meds.  Hypertensive emergency-stable -stable, no c/o's visual disturbances, headaches, chest pain or SOB, bilateral lower extremity 2+ edema, pt states that the swelling is decreasing -Last BP 162/65, SBP as high as 162 overnight -Continue current meds Amlodipine 10mg  OD, Carvedilol 25mg  BID, Clonidine 0.2mg  daily -Lasix discontinued 10/05 -Repeat BMP in am -Nephro follolowing   Perinephric hemorrhage-stable -Hbg today 10.0 today -Pt has no complaints of back pain, no hematuria -Monitor CBC daily -Per nephro, no need for repeat CT now  AKI on CKD IV- s/p renal biopsy -Cr 8.26, decreased form 10.23 yesterday, Baseline 1.3 -Nephro following -HD 10/07- 500cc removed -hold metformin -avoid nephrotoxic medications   Type 2 Diabetes-stable  -Last CBG 124 -Diet controlled -Home med Metformin on hold -Moderate s/s for coverage, no insulin given overnight  Acute on chronic anemia of CKD-stable -Hbg 10.0 -CBC daily   FEN/GI: -NPO for procedure  -Carb Modified after procedure -N/S KVO  PPx:  Hepatin [place]   Disposition: Home when medically cleared  Subjective: Pt sitting in chair having a sponge bath. A little nervous for procedure today.  Denies chest pain, SOB, n/v abo pain. Denies any headaches, visual disturbances.  NPO since midnight.  Passing gas and voiding  well.  Objective: Temp:  [98.2 F (36.8 C)-99.3 F (37.4 C)] 99.3 F (37.4 C) (10/07 0312) Pulse Rate:  [71-104] 85 (10/07 0500) Resp:  [13-30] 30 (10/07 0500) BP: (107-205)/(53-96) 138/71 (10/07 0500) SpO2:  [93 %-100 %] 97 % (10/07 0500) Weight:  [118.5 kg-119 kg] 118.5 kg (10/07 0312)   Physical Exam: General: Alert and oriented, no apparent distress  Cardiovascular: RRR,no murmurs noted,bilateral lower extremity 2+ edema  Respiratory: CTAB, no crackles, no rhonchi, good cap refill Gastrointestinal: Bowel sounds present. No abdominal pain MSK: Upper extremity strength 5/5 bilaterally, Lower extremity strength 5/5 bilaterally Derm: No rashes noted  Psych: Behavior and speech appropriate to situation   Laboratory: Recent Labs  Lab 02/08/19 0454 02/09/19 0334 02/10/19 0428  WBC 7.7 8.5 16.5*  HGB 9.6* 9.7* 10.0*  HCT 28.9* 30.0* 30.5*  PLT 243 266 298   Recent Labs  Lab 02/05/19 0043  02/08/19 0454 02/09/19 0334 02/10/19 0428  NA  --    < > 136 139 138  K  --    < > 4.0 4.0 3.7  CL  --    < > 105 108 105  CO2  --    < > 18* 18* 21*  BUN  --    < > 68* 70* 51*  CREATININE  --    < > 9.25* 10.23* 8.26*  CALCIUM  --    < > 8.4* 8.6* 8.5*  PROT 7.7  --   --   --   --   BILITOT 0.8  --   --   --   --   ALKPHOS 141*  --   --   --   --  ALT 21  --   --   --   --   AST 22  --   --   --   --   GLUCOSE  --    < > 127* 108* 100*   < > = values in this interval not displayed.      Imaging/Diagnostic Tests: No results found.  Carollee Leitz, MD 02/10/2019, 5:30 AM PGY-1, [place] Intern pager: [phone], text pages welcome

## 2019-02-10 NOTE — Progress Notes (Signed)
Patient ID: [name], female   DOB: [birth year], 58 y.o.   MRN: [id number] [facility] Progress Note   Assessment/ Plan:    1.  Acute kidney injury on chronic kidney disease stage IV:  Appears to be hemodynamically mediated with hypertensive emergency/volume contraction from GI loss.  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  Noted effusion.  Lasix was discontinued.  Renal ultrasound on 10/5 identifies only trace fluid at the lower pole of the kidney.  Tunneled dialysis catheter on 10/6 with IR and started HD early AM on 10/7.   - Plan for HD on 10/8.  Assess dialysis needs daily  - Concern for progression to ESRD   - Check post-void and place foley if indicated   - Going to the floor later today if bed available   2. CKD stage IV - reported baseline Cr 3-4 and admitted with Cr 5  3.  Hypertensive emergency: With acute kidney injury on chronic kidney disease, blood pressures better now with oral antihypertensive therapy.  Avoid hypotension.  Continue current regimen for now   4.  Status post renal biopsy with left perinephric hematoma: - hemoglobin stable.  Assessed as small on initial imaging.  - Renal ultrasound on 10/5 identifies only trace fluid at the lower pole of the kidney  5.  Diabetes mellitus: Hold/discontinue metformin indefinitely with her current GFR.  regimen per primary team   6.  Leukocytosis: Without fever or other focal infection.  resolved.  Antibiotics per primary team    7. Metabolic acidosis  - transitioned to HD for management.   8. Anemia secondary in part to CKD.  Hb stable.  ESA and feraheme on 10/7   Subjective:   Had her first HD treatment late overnight and finished early this AM at 3:00 per HD RN.   Had tunneled dialysis catheter on 10/6.  She is still getting used to the dialysis - a little overwhelming.   Review of systems:   Denies shortness of breath or chest pain Denies nausea or vomiting  Objective:   BP 138/71   Pulse 85   Temp 99.3 F (37.4 C) (Oral)   Resp (!) 30   Wt 118.5 kg   SpO2 97%   BMI 38.58 kg/m   Intake/Output Summary (Last 24 hours) at 02/10/2019 0616 Last data filed at 02/10/2019 0400 Gross per 24 hour  Intake 200 ml  Output 1100 ml  Net -900 ml   Weight change:   Physical Exam: Gen: adult female in bed in NAD at rest    CVS: S1S2; no rub Resp: clear to auscultation anteriorly; normal work of breathing at rest on room air  Abd: Soft, obese, nontender Ext: 1+ edema lower extremities  Neuro alert and oriented x 3; provides hx Psych normal mood and affect  GU - purewick Access Right chest tunneled catheter   Imaging: US Renal  Result Date: 02/08/2019 CLINICAL DATA:  Assess for perinephric hematoma EXAM: RENAL / URINARY TRACT ULTRASOUND COMPLETE COMPARISON:  CT 02/05/2019 renal ultrasound 12/02/2018 FINDINGS: Right Kidney: Renal measurements: 11 x 6 x 6.4 cm = volume: 221.7 mL. Increased echogenicity. No hydronephrosis. Trace perinephric fluid near the lower pole. Previously noted cyst in the right kidney on ultrasound not confidently identified today. Left Kidney: Renal measurements: 10.3 x 6.1 x 5.7 cm = volume: 184.3 mL. Increased cortical echogenicity. No hydronephrosis. Previously identified complex cyst in the  mid left kidney is not well demonstrated today. Bladder: Appears normal for degree of bladder distention. IMPRESSION: 1. Echogenic kidneys consistent with medical renal disease. No hydronephrosis 2. Trace perinephric fluid at the lower pole right kidney. No appreciable left perinephric fluid collections as noted on the previous CT. Electronically Signed   By: Donavan Foil M.D.   On: 02/08/2019 23:08    Labs: BMET Recent Labs  Lab 02/04/19 2205 02/05/19 0306 02/05/19 1224 02/06/19 0250 02/07/19 0307 02/08/19 0454 02/09/19 0334 02/10/19 0428  NA 141 144  --   140 136 136 139 138  K 3.7 3.8  --  4.1 3.9 4.0 4.0 3.7  CL 109  --   --  112* 107 105 108 105  CO2 19*  --   --  18* 19* 18* 18* 21*  GLUCOSE 169*  --   --  91 113* 127* 108* 100*  BUN 39*  --   --  52* 61* 68* 70* 51*  CREATININE 5.07*  --  5.76* 6.60* 7.95* 9.25* 10.23* 8.26*  CALCIUM 9.3  --   --  8.0* 8.1* 8.4* 8.6* 8.5*  PHOS  --   --   --  5.6* 6.2* 6.6* 7.4* 5.3*   CBC Recent Labs  Lab 02/07/19 0846 02/08/19 0454 02/09/19 0334 02/10/19 0428  WBC 9.2 7.7 8.5 16.5*  NEUTROABS  --   --   --  14.4*  HGB 9.6* 9.6* 9.7* 10.0*  HCT 29.5* 28.9* 30.0* 30.5*  MCV 82.6 81.4 81.5 80.9  PLT 238 243 266 298    Medications:    . amLODipine  10 mg Oral Daily  . carvedilol  25 mg Oral BID WC  . Chlorhexidine Gluconate Cloth  6 each Topical Daily  . Chlorhexidine Gluconate Cloth  6 each Topical Q0600  . Chlorhexidine Gluconate Cloth  6 each Topical Q0600  . cloNIDine  0.2 mg Oral TID  . darbepoetin (ARANESP) injection - DIALYSIS  40 mcg Intravenous Q Tue-HD  . heparin  5,000 Units Subcutaneous Q8H  . insulin aspart  0-15 Units Subcutaneous Q4H  . polyethylene glycol  17 g Oral Daily   Claudia Desanctis 02/10/2019, 6:16 AM

## 2019-02-11 DIAGNOSIS — N179 Acute kidney failure, unspecified: Secondary | ICD-10-CM | POA: Diagnosis not present

## 2019-02-11 DIAGNOSIS — I161 Hypertensive emergency: Secondary | ICD-10-CM | POA: Diagnosis not present

## 2019-02-11 LAB — RENAL FUNCTION PANEL
Albumin: 2.5 g/dL — ABNORMAL LOW (ref 3.5–5.0)
Anion gap: 14 (ref 5–15)
BUN: 58 mg/dL — ABNORMAL HIGH (ref 6–20)
CO2: 22 mmol/L (ref 22–32)
Calcium: 8.8 mg/dL — ABNORMAL LOW (ref 8.9–10.3)
Chloride: 104 mmol/L (ref 98–111)
Creatinine, Ser: 9.38 mg/dL — ABNORMAL HIGH (ref 0.44–1.00)
GFR calc Af Amer: 5 mL/min — ABNORMAL LOW (ref >60)
GFR calc non Af Amer: 4 mL/min — ABNORMAL LOW (ref >60)
Glucose, Bld: 91 mg/dL (ref 70–99)
Phosphorus: 6.2 mg/dL — ABNORMAL HIGH (ref 2.5–4.6)
Potassium: 3.8 mmol/L (ref 3.5–5.1)
Sodium: 140 mmol/L (ref 135–145)

## 2019-02-11 LAB — CBC WITH DIFFERENTIAL/PLATELET
Abs Immature Granulocytes: 0.13 10*3/uL — ABNORMAL HIGH (ref 0.00–0.07)
Basophils Absolute: 0 10*3/uL (ref 0.0–0.1)
Basophils Relative: 0 %
Eosinophils Absolute: 0.2 10*3/uL (ref 0.0–0.5)
Eosinophils Relative: 2 %
HCT: 28.2 % — ABNORMAL LOW (ref 36.0–46.0)
Hemoglobin: 9.4 g/dL — ABNORMAL LOW (ref 12.0–15.0)
Immature Granulocytes: 1 %
Lymphocytes Relative: 16 %
Lymphs Abs: 2.1 10*3/uL (ref 0.7–4.0)
MCH: 27.2 pg (ref 26.0–34.0)
MCHC: 33.3 g/dL (ref 30.0–36.0)
MCV: 81.7 fL (ref 80.0–100.0)
Monocytes Absolute: 0.8 10*3/uL (ref 0.1–1.0)
Monocytes Relative: 6 %
Neutro Abs: 9.5 10*3/uL — ABNORMAL HIGH (ref 1.7–7.7)
Neutrophils Relative %: 75 %
Platelets: 286 10*3/uL (ref 150–400)
RBC: 3.45 MIL/uL — ABNORMAL LOW (ref 3.87–5.11)
RDW: 15.6 % — ABNORMAL HIGH (ref 11.5–15.5)
WBC: 12.8 10*3/uL — ABNORMAL HIGH (ref 4.0–10.5)
nRBC: 0.2 % (ref 0.0–0.2)

## 2019-02-11 LAB — GLUCOSE, CAPILLARY
Glucose-Capillary: 94 mg/dL (ref 70–99)
Glucose-Capillary: 142 mg/dL — ABNORMAL HIGH (ref 70–99)
Glucose-Capillary: 102 mg/dL — ABNORMAL HIGH (ref 70–99)
Glucose-Capillary: 101 mg/dL — ABNORMAL HIGH (ref 70–99)
Glucose-Capillary: 107 mg/dL — ABNORMAL HIGH (ref 70–99)

## 2019-02-11 MED ORDER — POLYETHYLENE GLYCOL 3350 17 G PO PACK
17.0000 g | PACK | Freq: Two times a day (BID) | ORAL | Status: DC
  Administered 2019-02-11 – 2019-02-18 (×9): 17 g via ORAL
  Filled 2019-02-11 (×12): qty 1

## 2019-02-11 MED ORDER — ACETAMINOPHEN 325 MG PO TABS
650.0000 mg | ORAL_TABLET | Freq: Four times a day (QID) | ORAL | Status: DC | PRN
  Administered 2019-02-11 – 2019-02-17 (×2): 650 mg via ORAL
  Filled 2019-02-11 (×2): qty 2

## 2019-02-11 NOTE — Progress Notes (Signed)
PT transported to 5W37 with no adverse events.

## 2019-02-11 NOTE — Evaluation (Signed)
Physical Therapy Evaluation Patient Details Name: [name] MRN: [id number] DOB: 13-May-1960 Today's Date: 02/11/2019   History of Present Illness  58yo female s/p kidney biopsy done 02/03/19 after which she developed nausea and vomiting, found to be in hypertensive emergency in the ED and admitted to ICU. HD cath placed 10/6, first HD session 10/7. PMH DM, HLD, HTN, CKD, obesity  Clinical Impression   Patient received in bed, very pleasant and willing to work with therapy today. Able to complete functional bed mobility with mod(I), otherwise required Min guard for functional transfers and gait approximately 63f with no device. Easily fatigued and did require frequent standing rest breaks during gait but very motivated to improve. VSS during gait. She was left up in her chair with all needs met and questions/concerns addressed this morning. She will continue to benefit from skilled PT services in the acute setting, suspect that mobility/endurance will improve greatly along with medical status and currently recommending skilled OP PT services moving forward.     Follow Up Recommendations Outpatient PT    Equipment Recommendations  3in1 (PT)    Recommendations for Other Services       Precautions / Restrictions Precautions Precautions: Other (comment) Precaution Comments: easily fatigued Restrictions Weight Bearing Restrictions: No      Mobility  Bed Mobility Overal bed mobility: Modified Independent                Transfers Overall transfer level: Needs assistance Equipment used: None Transfers: Sit to/from Stand Sit to Stand: Min guard         General transfer comment: min guard for safety, no physical assist given  Ambulation/Gait Ambulation/Gait assistance: Min guard Gait Distance (Feet): 80 Feet Assistive device: None Gait Pattern/deviations: Step-through pattern;Decreased step length - right;Decreased step length - left Gait velocity: decreased   General  Gait Details: 2-3 standing rest breaks, easily fatigued; VSS with activity on room air but did become short of breath  Stairs            Wheelchair Mobility    Modified Rankin (Stroke Patients Only)       Balance Overall balance assessment: Mild deficits observed, not formally tested                                           Pertinent Vitals/Pain Pain Assessment: No/denies pain    Home Living Family/patient expects to be discharged to:: Private residence Living Arrangements: Children Available Help at Discharge: Family Type of Home: Apartment Home Access: Stairs to enter Entrance Stairs-Rails: Can reach both Entrance Stairs-Number of Steps: 2nd floor apartment, 15 steps going up Home Layout: One level Home Equipment: None      Prior Function Level of Independence: Independent         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     Hand Dominance   Dominant Hand: Right    Extremity/Trunk Assessment   Upper Extremity Assessment Upper Extremity Assessment: Generalized weakness    Lower Extremity Assessment Lower Extremity Assessment: Generalized weakness    Cervical / Trunk Assessment Cervical / Trunk Assessment: Normal  Communication   Communication: No difficulties  Cognition Arousal/Alertness: Awake/alert Behavior During Therapy: WFL for tasks assessed/performed Overall Cognitive Status: Within Functional Limits for tasks assessed  General Comments: A&Ox4      General Comments      Exercises     Assessment/Plan    PT Assessment Patient needs continued PT services  PT Problem List Decreased strength;Decreased mobility;Decreased safety awareness;Decreased coordination;Obesity;Decreased activity tolerance;Decreased balance       PT Treatment Interventions DME instruction;Therapeutic activities;Gait training;Therapeutic exercise;Patient/family education;Stair training;Balance  training;Functional mobility training;Neuromuscular re-education    PT Goals (Current goals can be found in the Care Plan section)  Acute Rehab PT Goals Patient Stated Goal: feel beter, go home PT Goal Formulation: With patient Time For Goal Achievement: 02/25/19 Potential to Achieve Goals: Good    Frequency Min 3X/week   Barriers to discharge        Co-evaluation               AM-PAC PT "6 Clicks" Mobility  Outcome Measure Help needed turning from your back to your side while in a flat bed without using bedrails?: None Help needed moving from lying on your back to sitting on the side of a flat bed without using bedrails?: None Help needed moving to and from a bed to a chair (including a wheelchair)?: A Little Help needed standing up from a chair using your arms (e.g., wheelchair or bedside chair)?: A Little Help needed to walk in hospital room?: A Little Help needed climbing 3-5 steps with a railing? : A Little 6 Click Score: 20    End of Session   Activity Tolerance: Patient tolerated treatment well Patient left: in chair;with call bell/phone within reach Nurse Communication: Mobility status PT Visit Diagnosis: Unsteadiness on feet (R26.81);Muscle weakness (generalized) (M62.81)    Time: 3539-1225 PT Time Calculation (min) (ACUTE ONLY): 25 min   Charges:   PT Evaluation $PT Eval Moderate Complexity: 1 Mod PT Treatments $Gait Training: 8-22 mins        Deniece Ree PT, DPT, CBIS  Supplemental Physical Therapist [place]    Pager [phone] Acute Rehab Office [phone]

## 2019-02-11 NOTE — Progress Notes (Signed)
Family Medicine Teaching Service Daily Progress Note Intern Pager: [phone]  Patient name: [name] Medical record number: [id number] Date of birth: [birth year] Age: 58 y.o. Gender: female  Primary Care Provider: Matilde Haymaker, MD Consultants: CCM, Nephrology, IR Code Status: Full  Pt Overview and Major Events to Date:  10/02- Admitted to ICU for Hypertensive emergency 10/06- HD cath placement 10/07-HD   Assessment and Plan: 58yo F with h/o T2DM, GERD, HLD, HTN, CKD presented with persistent emesis, chest pain, and headache found to be in hypertensive emergency in ED in setting of missed doses of BP meds.  Hypertensive emergency-improving BP overnight in 150's/60s. Did not need any prn meds overnight -Continue current meds Amlodipine 10mg  OD, Carvedilol 25mg  BID, Clonidine 0.2mg  daily -Repeat BMP in am  Kidney injury secondary to HTN emergency requiring dialysis Current Cr 9.38, worsening from 8.26. Urine output 163ml yesterday. Renal u/s 10/05 consistent with medical renal disease and no hydronephrosis. HD 10/07 for 500cc -renal adjustments for meds as needed -HD today -Nephro following -phos 6.2, Nephro will manage  -repeat renal panel in am   Leukocytosis Pt is afebrile. Vital signs stable.  No obvious signs of infection. Possible source HD cath but this was just placed on 10/06 so seems unlikely.Blood cultures 10/07 shows no growth after 5 daysCould be stress related. -repeat CBC  -CBC daily and monitor for signs and symptoms of infection.  Perinephric hemorrhage-stable -monitor for signs of reoccurance  Type 2 Diabetes-stable  CBG stable overnight, in the range of 94-139 Received 2u Novolog last night -Home med Metformin on hold -Moderate s/s for coverage  Acute on chronic anemia of CKD-stable -monitor cbc q2d   FEN/GI: -renal diet  -fluid restriction 1200cc -N/S KVO  PPx:  Heparin [place]   Disposition: Home when medically cleared  Subjective: Pt  resting comfortably in bed.  No issues overnight. Eating and drinking well.  Voiding well.  For dialysis today.  No recorded BM BM  Objective: Temp:  [98.3 F (36.8 C)-98.7 F (37.1 C)] 98.6 F (37 C) (10/08 1144) Pulse Rate:  [72-84] 76 (10/08 0918) Resp:  [17-28] 25 (10/08 0918) BP: (130-169)/(61-78) 130/62 (10/08 0815) SpO2:  [49 %-100 %] 100 % (10/08 0918) Weight:  [123.3 kg] 123.3 kg (10/08 0453)   Physical Exam: General: Alert and oriented, no apparent distress  Cardiovascular: RRR with no murmurs noted, distal pulses present Respiratory: CTA bilaterally, no crackles, no rhonchi , good cap refill Gastrointestinal: Bowel sounds present. No abdominal pain MSK: Upper extremity strength 5/5 bilaterally, Lower extremity strength 5/5 bilaterally, lower extremity edema decreasing  Derm: No rashes noted Neuro: orientated x4 Psych: Behavior and speech appropriate to situation    Laboratory: Recent Labs  Lab 02/08/19 0454 02/09/19 0334 02/10/19 0428  WBC 7.7 8.5 16.5*  HGB 9.6* 9.7* 10.0*  HCT 28.9* 30.0* 30.5*  PLT 243 266 298   Recent Labs  Lab 02/05/19 0043  02/09/19 0334 02/10/19 0428 02/11/19 0225  NA  --    < > 139 138 140  K  --    < > 4.0 3.7 3.8  CL  --    < > 108 105 104  CO2  --    < > 18* 21* 22  BUN  --    < > 70* 51* 58*  CREATININE  --    < > 10.23* 8.26* 9.38*  CALCIUM  --    < > 8.6* 8.5* 8.8*  PROT 7.7  --   --   --   --  BILITOT 0.8  --   --   --   --   ALKPHOS 141*  --   --   --   --   ALT 21  --   --   --   --   AST 22  --   --   --   --   GLUCOSE  --    < > 108* 100* 91   < > = values in this interval not displayed.      Imaging/Diagnostic Tests: No results found.  Carollee Leitz, MD 02/11/2019, 11:56 AM PGY-1, [place] Intern pager: [phone], text pages welcome

## 2019-02-11 NOTE — Progress Notes (Signed)
Patient arrived to unit [place] bed 37 from [address].Patient assisted to bathroom by nursing staff then assisted to bed.Patient alert and oriented x 4.Denies pain at present time.Oriented patient to nursing unit and call bell and educated patient to call for help prior to getting out of bed.Call bell and belongings within reach and bed alarm on for safety.Will continue to monitor.

## 2019-02-11 NOTE — Progress Notes (Signed)
Patient complains of soreness at HD site.No redness or edema noted.Call placed to Dr.Simmons for tylenol order.

## 2019-02-11 NOTE — Progress Notes (Signed)
Patient ID: [name], female   DOB: 05/05/1961, 58 y.o.   MRN: [id number] [facility] Progress Note   Assessment/ Plan:    1.  Acute kidney injury on chronic kidney disease stage IV:  Appears to be hemodynamically mediated with hypertensive emergency/volume contraction from GI loss.  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  Lasix was discontinued.  Renal ultrasound on 10/5 identifies only trace fluid at the lower pole of the kidney.  Tunneled dialysis catheter on 10/6 with IR and started HD early AM on 10/7.  Bladder scan acceptable on 10/8 - HD on 10/8 (2nd treatment)    - Following to determine length of dialysis need - may ultimately be found to have had CKD progression to ESRD and note fibrosis on bx  2. CKD stage IV - reported baseline Cr 3-4 and admitted with Cr 5.  Note Cr 3.35 on 11/12/18 (eGFR 17)  3.  Hypertensive emergency: With acute kidney injury on chronic kidney disease, blood pressures better now with oral antihypertensive therapy.  Avoid hypotension.  Continue current regimen for now  4.  Status post renal biopsy with left perinephric hematoma: - hemoglobin stable.  Assessed as small on initial imaging.  - Renal ultrasound on 10/5 identifies only trace fluid at the lower pole of the kidney  5.  Diabetes mellitus: Hold/discontinue metformin indefinitely with her current GFR.  regimen per primary team   6.  Leukocytosis:  afebrile.  Antibiotics per primary team.  Spoke with team - they are trending     7. Metabolic acidosis  - transitioned to HD for management.   8. Anemia secondary in part to CKD.  Hb stable.  On ESA and s/p feraheme   9. CKD - Bone mineral disease - hyperphos - improving with HD.  Check PTH.   Subjective:   Last HD on 10/6 late PM/10/7 early AM with 500 mL UF.  ICU RN reported bladder scan acceptable yesterday.  She had 1 liter  UOP over 10/7.  She feels well today and HD tx went well.  States has seen [facility] about a year.   Review of systems:   Denies shortness of breath or chest pain Denies nausea or vomiting  Objective:   BP 130/62   Pulse 76   Temp 98.3 F (36.8 C) (Oral)   Resp (!) 25   Wt 123.3 kg   SpO2 100%   BMI 40.14 kg/m   Intake/Output Summary (Last 24 hours) at 02/11/2019 1128 Last data filed at 02/11/2019 0640 Gross per 24 hour  Intake 700 ml  Output 1000 ml  Net -300 ml   Weight change: 4.3 kg  Physical Exam:    Gen: adult female in bed in NAD at rest    CVS: S1S2; no rub Resp: clear to auscultation anteriorly; normal work of breathing at rest on room air  Abd: Soft, obese, nontender Ext: 1+ edema lower extremities  Neuro alert and oriented x 3; provides hx Psych normal mood and affect  Access Right chest tunneled catheter   Imaging: Ir Fluoro Guide Cv Line Right  Result Date: 02/10/2019 INDICATION: 58 year old female with a history of renal failure EXAM: IMAGE GUIDED TUNNELED HEMODIALYSIS CATHETER PLACEMENT MEDICATIONS: 2 g Ancef; The antibiotic was administered within an appropriate time interval prior to skin puncture. ANESTHESIA/SEDATION: Moderate (conscious) sedation was employed during this procedure. A total of Versed 1.5 mg and Fentanyl  50 mcg was administered intravenously. Moderate Sedation Time: 10 minutes. The patient's level of consciousness and vital signs were monitored continuously by radiology nursing throughout the procedure under my direct supervision. FLUOROSCOPY TIME:  Fluoroscopy Time: 0 minutes 6 seconds (1.0 mGy). COMPLICATIONS: None PROCEDURE: Informed written consent was obtained from the patient after a discussion of the risks, benefits, and alternatives to treatment. Questions regarding the procedure were encouraged and answered. The right neck and chest were prepped with chlorhexidine in a sterile fashion, and a sterile drape was applied covering the operative  field. Maximum barrier sterile technique with sterile gowns and gloves were used for the procedure. A timeout was performed prior to the initiation of the procedure. After creating a small venotomy incision, a micropuncture kit was utilized to access the right internal jugular vein under direct, real-time ultrasound guidance after the overlying soft tissues were anesthetized with 1% lidocaine with epinephrine. Ultrasound image documentation was performed. The microwire was marked to measure appropriate internal catheter length. External tunneled length was estimated. A total tip to cuff length of 19 cm was selected. Skin and subcutaneous tissues of chest wall below the clavicle were generously infiltrated with 1% lidocaine for local anesthesia. A small stab incision was made with 11 blade scalpel. The selected hemodialysis catheter was tunneled in a retrograde fashion from the anterior chest wall to the venotomy incision. A guidewire was advanced to the level of the IVC and the micropuncture sheath was exchanged for a peel-away sheath. The catheter was then placed through the peel-away sheath with tips ultimately positioned within the superior aspect of the right atrium. Final catheter positioning was confirmed and documented with a spot radiographic image. The catheter aspirates and flushes normally. The catheter was flushed with appropriate volume heparin dwells. The catheter exit site was secured with a 0-Prolene retention suture. The venotomy incision was closed Derma bond and sterile dressing. Dressings were applied at the chest wall. Patient tolerated the procedure well and remained hemodynamically stable throughout. No complications were encountered and no significant blood loss encountered. IMPRESSION: Status post right IJ tunneled hemodialysis catheter placement. Catheter ready for use Signed, Dulcy Fanny. Dellia Nims, RPVI [facility] Radiology Electronically  Signed   By: Corrie Mckusick D.O.   On: 02/10/2019 07:20   Ir US Guide Vasc Access Right  Result Date: 02/10/2019 INDICATION: 58 year old female with a history of renal failure EXAM: IMAGE GUIDED TUNNELED HEMODIALYSIS CATHETER PLACEMENT MEDICATIONS: 2 g Ancef; The antibiotic was administered within an appropriate time interval prior to skin puncture. ANESTHESIA/SEDATION: Moderate (conscious) sedation was employed during this procedure. A total of Versed 1.5 mg and Fentanyl 50 mcg was administered intravenously. Moderate Sedation Time: 10 minutes. The patient's level of consciousness and vital signs were monitored continuously by radiology nursing throughout the procedure under my direct supervision. FLUOROSCOPY TIME:  Fluoroscopy Time: 0 minutes 6 seconds (1.0 mGy). COMPLICATIONS: None PROCEDURE: Informed written consent was obtained from the patient after a discussion of the risks, benefits, and alternatives to treatment. Questions regarding the procedure were encouraged and answered. The right neck and chest were prepped with chlorhexidine in a sterile fashion, and a sterile drape was applied covering the operative field. Maximum barrier sterile technique with sterile gowns and gloves were used for the procedure. A timeout was performed prior to the initiation of the procedure. After creating a small venotomy incision, a micropuncture kit was utilized to access the right internal jugular vein under direct, real-time ultrasound guidance after the overlying  soft tissues were anesthetized with 1% lidocaine with epinephrine. Ultrasound image documentation was performed. The microwire was marked to measure appropriate internal catheter length. External tunneled length was estimated. A total tip to cuff length of 19 cm was selected. Skin and subcutaneous tissues of chest wall below the clavicle were generously infiltrated with 1% lidocaine for local anesthesia. A small stab incision was made with 11 blade scalpel. The  selected hemodialysis catheter was tunneled in a retrograde fashion from the anterior chest wall to the venotomy incision. A guidewire was advanced to the level of the IVC and the micropuncture sheath was exchanged for a peel-away sheath. The catheter was then placed through the peel-away sheath with tips ultimately positioned within the superior aspect of the right atrium. Final catheter positioning was confirmed and documented with a spot radiographic image. The catheter aspirates and flushes normally. The catheter was flushed with appropriate volume heparin dwells. The catheter exit site was secured with a 0-Prolene retention suture. The venotomy incision was closed Derma bond and sterile dressing. Dressings were applied at the chest wall. Patient tolerated the procedure well and remained hemodynamically stable throughout. No complications were encountered and no significant blood loss encountered. IMPRESSION: Status post right IJ tunneled hemodialysis catheter placement. Catheter ready for use Signed, Dulcy Fanny. Dellia Nims, RPVI [facility] Radiology Electronically Signed   By: Corrie Mckusick D.O.   On: 02/10/2019 07:20    Labs: BMET Recent Labs  Lab 02/04/19 2205 02/05/19 0306 02/05/19 1224 02/06/19 0250 02/07/19 0307 02/08/19 0454 02/09/19 0334 02/10/19 0428 02/11/19 0225  NA 141 144  --  140 136 136 139 138 140  K 3.7 3.8  --  4.1 3.9 4.0 4.0 3.7 3.8  CL 109  --   --  112* 107 105 108 105 104  CO2 19*  --   --  18* 19* 18* 18* 21* 22  GLUCOSE 169*  --   --  91 113* 127* 108* 100* 91  BUN 39*  --   --  52* 61* 68* 70* 51* 58*  CREATININE 5.07*  --  5.76* 6.60* 7.95* 9.25* 10.23* 8.26* 9.38*  CALCIUM 9.3  --   --  8.0* 8.1* 8.4* 8.6* 8.5* 8.8*  PHOS  --   --   --  5.6* 6.2* 6.6* 7.4* 5.3* 6.2*   CBC Recent Labs  Lab 02/07/19 0846 02/08/19 0454 02/09/19 0334 02/10/19 0428  WBC 9.2 7.7 8.5 16.5*  NEUTROABS  --   --   --  14.4*  HGB  9.6* 9.6* 9.7* 10.0*  HCT 29.5* 28.9* 30.0* 30.5*  MCV 82.6 81.4 81.5 80.9  PLT 238 243 266 298    Medications:    . amLODipine  10 mg Oral Daily  . carvedilol  25 mg Oral BID WC  . Chlorhexidine Gluconate Cloth  6 each Topical Daily  . Chlorhexidine Gluconate Cloth  6 each Topical Q0600  . Chlorhexidine Gluconate Cloth  6 each Topical Q0600  . Chlorhexidine Gluconate Cloth  6 each Topical Q0600  . cloNIDine  0.2 mg Oral TID  . heparin  5,000 Units Subcutaneous Q8H  . insulin aspart  0-15 Units Subcutaneous Q4H  . polyethylene glycol  17 g Oral Daily  . senna  1 tablet Oral Daily   [name] 02/11/2019, 11:28 AM

## 2019-02-12 ENCOUNTER — Encounter (HOSPITAL_COMMUNITY): Payer: Self-pay | Admitting: *Deleted

## 2019-02-12 DIAGNOSIS — N179 Acute kidney failure, unspecified: Secondary | ICD-10-CM | POA: Diagnosis not present

## 2019-02-12 DIAGNOSIS — I161 Hypertensive emergency: Secondary | ICD-10-CM | POA: Diagnosis not present

## 2019-02-12 LAB — CBC WITH DIFFERENTIAL/PLATELET
Abs Immature Granulocytes: 0.15 10*3/uL — ABNORMAL HIGH (ref 0.00–0.07)
Basophils Absolute: 0 10*3/uL (ref 0.0–0.1)
Basophils Relative: 0 %
Eosinophils Absolute: 0.2 10*3/uL (ref 0.0–0.5)
Eosinophils Relative: 2 %
HCT: 29.8 % — ABNORMAL LOW (ref 36.0–46.0)
Hemoglobin: 9.6 g/dL — ABNORMAL LOW (ref 12.0–15.0)
Immature Granulocytes: 1 %
Lymphocytes Relative: 15 %
Lymphs Abs: 2 10*3/uL (ref 0.7–4.0)
MCH: 26.6 pg (ref 26.0–34.0)
MCHC: 32.2 g/dL (ref 30.0–36.0)
MCV: 82.5 fL (ref 80.0–100.0)
Monocytes Absolute: 0.9 10*3/uL (ref 0.1–1.0)
Monocytes Relative: 7 %
Neutro Abs: 10.2 10*3/uL — ABNORMAL HIGH (ref 1.7–7.7)
Neutrophils Relative %: 75 %
Platelets: 259 10*3/uL (ref 150–400)
RBC: 3.61 MIL/uL — ABNORMAL LOW (ref 3.87–5.11)
RDW: 15.5 % (ref 11.5–15.5)
WBC: 13.6 10*3/uL — ABNORMAL HIGH (ref 4.0–10.5)
nRBC: 0.1 % (ref 0.0–0.2)

## 2019-02-12 LAB — RENAL FUNCTION PANEL
Albumin: 2.6 g/dL — ABNORMAL LOW (ref 3.5–5.0)
Anion gap: 17 — ABNORMAL HIGH (ref 5–15)
BUN: 60 mg/dL — ABNORMAL HIGH (ref 6–20)
CO2: 20 mmol/L — ABNORMAL LOW (ref 22–32)
Calcium: 8.8 mg/dL — ABNORMAL LOW (ref 8.9–10.3)
Chloride: 105 mmol/L (ref 98–111)
Creatinine, Ser: 10.01 mg/dL — ABNORMAL HIGH (ref 0.44–1.00)
GFR calc Af Amer: 4 mL/min — ABNORMAL LOW (ref >60)
GFR calc non Af Amer: 4 mL/min — ABNORMAL LOW (ref >60)
Glucose, Bld: 100 mg/dL — ABNORMAL HIGH (ref 70–99)
Phosphorus: 5.9 mg/dL — ABNORMAL HIGH (ref 2.5–4.6)
Potassium: 3.8 mmol/L (ref 3.5–5.1)
Sodium: 142 mmol/L (ref 135–145)

## 2019-02-12 LAB — GLUCOSE, CAPILLARY
Glucose-Capillary: 101 mg/dL — ABNORMAL HIGH (ref 70–99)
Glucose-Capillary: 114 mg/dL — ABNORMAL HIGH (ref 70–99)
Glucose-Capillary: 114 mg/dL — ABNORMAL HIGH (ref 70–99)
Glucose-Capillary: 118 mg/dL — ABNORMAL HIGH (ref 70–99)
Glucose-Capillary: 171 mg/dL — ABNORMAL HIGH (ref 70–99)
Glucose-Capillary: 88 mg/dL (ref 70–99)

## 2019-02-12 MED ORDER — HEPARIN SODIUM (PORCINE) 1000 UNIT/ML IJ SOLN
INTRAMUSCULAR | Status: AC
  Filled 2019-02-12: qty 4

## 2019-02-12 NOTE — Progress Notes (Addendum)
Patient is complaining of itchiness and she;s requesting for Benadryl for that.Dr. Rosita Fire notified and received Benadryl 25 mg oral x one dose. Order already implemented. Will continue to monitor.

## 2019-02-12 NOTE — Evaluation (Signed)
Occupational Therapy Evaluation Patient Details Name: [name] MRN: [id number] DOB: 10/01/1960 Today's Date: 02/12/2019    History of Present Illness 58yo female s/p kidney biopsy done 02/03/19 after which she developed nausea and vomiting, found to be in hypertensive emergency in the ED and admitted to ICU. HD cath placed 10/6, first HD session 10/7. PMH DM, HLD, HTN, CKD, obesity   Clinical Impression   Pt is at min guard A - sup level for safety with ADLs/selfcare (standing) and mobility using no AD. No physical assist with activities. 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 Pt with mild balance deficits, fatigues easily. All education completed and no further acute OT is indicated at this time. Pt to continue with acute PT services for functional mobility safety. OT will sign off    Follow Up Recommendations  No OT follow up    Equipment Recommendations  Tub/shower bench    Recommendations for Other Services       Precautions / Restrictions Precautions Precautions: Other (comment) Precaution Comments: easily fatigued Restrictions Weight Bearing Restrictions: No      Mobility Bed Mobility Overal bed mobility: Modified Independent                Transfers Overall transfer level: Needs assistance Equipment used: None Transfers: Sit to/from Stand Sit to Stand: Min guard;Supervision         General transfer comment: min guard A for initial stand from recliner    Balance Overall balance assessment: Mild deficits observed, not formally tested                                         ADL either performed or assessed with clinical judgement   ADL                                         General ADL Comments: Min guard A - Sup for safety during standing. Min guard A - sup for toilet transfers      Vision Baseline Vision/History: Wears glasses Patient Visual Report: No change from baseline       Perception     Praxis      Pertinent Vitals/Pain Pain Assessment: No/denies pain     Hand Dominance Right   Extremity/Trunk Assessment Upper Extremity Assessment Upper Extremity Assessment: Generalized weakness   Lower Extremity Assessment Lower Extremity Assessment: Defer to PT evaluation   Cervical / Trunk Assessment Cervical / Trunk Assessment: Normal   Communication Communication Communication: No difficulties   Cognition Arousal/Alertness: Awake/alert Behavior During Therapy: WFL for tasks assessed/performed Overall Cognitive Status: Within Functional Limits for tasks assessed                                     General Comments       Exercises     Shoulder Instructions      Home Living Family/patient expects to be discharged to:: Private residence Living Arrangements: Children Available Help at Discharge: Family Type of Home: Apartment Home Access: Stairs to enter [employer] of Steps: 2nd floor apartment, 15 steps  going up Entrance Stairs-Rails: Can reach both Home Layout: One level     Bathroom Shower/Tub: Teacher, early years/pre: Standard     Home Equipment: None          Prior Functioning/Environment Level of Independence: Independent        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        OT Problem List: Decreased activity tolerance;Impaired balance (sitting and/or standing)      OT Treatment/Interventions:      OT Goals(Current goals can be found in the care plan section) Acute Rehab OT Goals Patient Stated Goal: feel beter, go home OT Goal Formulation: With patient  OT Frequency:     Barriers to D/C:    no barriers       Co-evaluation              AM-PAC OT "6 Clicks" Daily Activity     Outcome Measure Help from another person eating meals?: None Help from another  person taking care of personal grooming?: A Little Help from another person toileting, which includes using toliet, bedpan, or urinal?: A Little Help from another person bathing (including washing, rinsing, drying)?: A Little Help from another person to put on and taking off regular upper body clothing?: None Help from another person to put on and taking off regular lower body clothing?: A Little 6 Click Score: 20   End of Session    Activity Tolerance: Patient tolerated treatment well Patient left: in bed;with call bell/phone within reach  OT Visit Diagnosis: Unsteadiness on feet (R26.81);Muscle weakness (generalized) (M62.81)                Time: 4680-3212 OT Time Calculation (min): 23 min Charges:  OT General Charges $OT Visit: 1 Visit OT Evaluation $OT Eval Moderate Complexity: 1 Mod    Britt Bottom 02/12/2019, 1:09 PM

## 2019-02-12 NOTE — Progress Notes (Signed)
Family Medicine Teaching Service Daily Progress Note Intern Pager: [phone]  Patient name: [name] Medical record number: [id number] Date of birth: Jul 23, 1960 Age: 58 y.o. Gender: female  Primary Care Provider: Matilde Haymaker, MD Consultants: CCM, Nephrology, IR Code Status: Full  Pt Overview and Major Events to Date:  10/02- Admitted to ICU for Hypertensive emergency 10/06- HD cath placement 10/07-HD   Assessment and Plan: 58yo F with h/o T2DM, GERD, HLD, HTN, CKD presented with persistent emesis, chest pain, and headache found to be in hypertensive emergency in ED in setting of missed doses of BP meds.  Hypertensive emergency-improving BP overnight in 150's/60s. Did not need any prn meds overnight -Continue current meds Amlodipine 10mg  OD, Carvedilol 25mg  BID, Clonidine 0.2mg  daily -Repeat BMP in am  Kidney injury secondary to HTN emergency requiring dialysis Current Cr 10.01, worsening from 9.38.  Urine output 3400 mls yesterday. -renal adjustments for meds as needed -HD today -Nephro following and managing electrolytes -repeat renal panel in am   Leukocytosis WBCs 12.8, decreased from 16.5 yesterday.  Pt is afebrile. Vital signs stable.  No obvious signs of infection. Possible source HD cath but this was just placed on 10/06 so seems unlikely.Blood cultures 10/07 shows no growth after 5 daysCould be stress related. -repeat CBC  -CBC daily and monitor for signs and symptoms of infection.  Perinephric hemorrhage-stable -monitor for signs of reoccurance  Type 2 Diabetes-stable  CBG stable overnight, in the range of 94-139 Received 2u Novolog last night -Home med Metformin on hold -Moderate s/s for coverage  Acute on chronic anemia of CKD-stable -monitor cbc q2d   FEN/GI: -renal diet  -fluid restriction 1200cc -N/S KVO  PPx:  Heparin [employer]   Disposition: Home when medically cleared  Subjective: Patient currently had dialysis.  Denies any chest pain,  shortness of breath.  Patient states she feels good.   Objective: Temp:  [98.1 F (36.7 C)-99 F (37.2 C)] 98.8 F (37.1 C) (10/09 0656) Pulse Rate:  [72-94] 89 (10/09 0730) Resp:  [18-30] 25 (10/09 0730) BP: (130-217)/(62-99) 207/84 (10/09 0730) SpO2:  [49 %-100 %] 100 % (10/09 0656) Weight:  [116.3 kg-117.5 kg] 116.3 kg (10/09 0656)   Physical Exam: General: Awake and in no apparent distress  Cardiovascular: RRR with no murmurs noted Respiratory: CTA bilaterally, no crackles, no rhonchi  Gastrointestinal: Soft, nondistended, bowel sounds present. No abdominal pain MSK: Moves all extremities, lower extremity edema improving Derm: No rashes noted, HD cath site dressing intact Neuro: Alert and oriented x4 Psych: Behavior and speech appropriate to situation     Laboratory: Recent Labs  Lab 02/10/19 0428 02/11/19 1250 02/12/19 0318  WBC 16.5* 12.8* 13.6*  HGB 10.0* 9.4* 9.6*  HCT 30.5* 28.2* 29.8*  PLT 298 286 259   Recent Labs  Lab 02/10/19 0428 02/11/19 0225 02/12/19 0318  NA 138 140 142  K 3.7 3.8 3.8  CL 105 104 105  CO2 21* 22 20*  BUN 51* 58* 60*  CREATININE 8.26* 9.38* 10.01*  CALCIUM 8.5* 8.8* 8.8*  GLUCOSE 100* 91 100*      Imaging/Diagnostic Tests: No results found.  Carollee Leitz, MD 02/12/2019, 7:52 AM PGY-1, [place] Intern pager: [phone], text pages welcome

## 2019-02-12 NOTE — Progress Notes (Signed)
Patient ID: [name], female   DOB: 02-08-1961, 58 y.o.   MRN: [id number] [facility] Progress Note   Assessment/ Plan:    1.  Acute kidney injury on chronic kidney disease stage IV:  In July crt over 3. The acute change appears to be hemodynamically mediated with hypertensive emergency/volume contraction from GI loss.  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 Has required initiation of HD Tunneled dialysis catheter on 10/6 with IR and started HD early AM on 10/7 - HD  Today (2nd treatment)    - Following to determine length of dialysis need -I suspect is at ESRD given appearance of biopsy and crt 10 in lady with not much muscle mass but can watch over the weekend- making plenty of urine- if will require HD into next week will get vein map, access and CLIP for OP HD  2. CKD stage IV - reported baseline Cr 3-4 and admitted with Cr 5.  Note Cr 3.35 on 11/12/18 (eGFR 17)  3.  Hypertensive emergency: With acute kidney injury on chronic kidney disease, blood pressures better now with oral antihypertensive therapy.  Avoid hypotension.   amlodipine 10/coreg 25 BID, clonidine 0.2 TID and UF with HD - more hypertensive this AM- I suspect clonidine was on hold for HD- will watch it over weekend  4.  Diabetes mellitus: Hold/discontinue metformin indefinitely with her current GFR.  regimen per primary team   5.  Leukocytosis:  afebrile.  Antibiotics per primary team.  Spoke with team - they are trending     7. Metabolic acidosis  - transitioned to HD for management.   8. Anemia secondary in part to CKD.  Hb stable.  On ESA and s/p feraheme   9. CKD - Bone mineral disease - hyperphos - improving with HD.  Check PTH- pending   Subjective:   Had HD this AM- second treatment- BP high - said she had transient nausea but OK now.  appetite still sluggish- having great UOP     Objective:    BP (!) 191/82 (BP Location: Right Arm)   Pulse 88   Temp 98.6 F (37 C) (Oral)   Resp 20   Wt 116.3 kg   SpO2 100%   BMI 37.86 kg/m   Intake/Output Summary (Last 24 hours) at 02/12/2019 0941 Last data filed at 02/12/2019 0910 Gross per 24 hour  Intake 260 ml  Output 4950 ml  Net -4690 ml   Weight change: -5.8 kg  Physical Exam:    Gen: adult female in bed in NAD at rest    CVS: S1S2; no rub Resp: clear to auscultation anteriorly; normal work of breathing at rest on room air  Abd: Soft, obese, nontender Ext: trace to 1+ edema lower extremities  Neuro alert and oriented x 3; provides hx Psych normal mood and affect  Access Right chest tunneled catheter   Imaging: No results found.  Labs: BMET Recent Labs  Lab 02/06/19 0250 02/07/19 0307 02/08/19 0454 02/09/19 0334 02/10/19 0428 02/11/19 0225 02/12/19 0318  NA 140 136 136 139 138 140 142  K 4.1 3.9 4.0 4.0 3.7 3.8 3.8  CL 112* 107 105 108 105 104 105  CO2 18* 19* 18* 18* 21* 22 20*  GLUCOSE 91 113* 127* 108* 100* 91 100*  BUN 52* 61* 68* 70* 51* 58* 60*  CREATININE 6.60* 7.95* 9.25* 10.23* 8.26* 9.38* 10.01*  CALCIUM  8.0* 8.1* 8.4* 8.6* 8.5* 8.8* 8.8*  PHOS 5.6* 6.2* 6.6* 7.4* 5.3* 6.2* 5.9*   CBC Recent Labs  Lab 02/09/19 0334 02/10/19 0428 02/11/19 1250 02/12/19 0318  WBC 8.5 16.5* 12.8* 13.6*  NEUTROABS  --  14.4* 9.5* 10.2*  HGB 9.7* 10.0* 9.4* 9.6*  HCT 30.0* 30.5* 28.2* 29.8*  MCV 81.5 80.9 81.7 82.5  PLT 266 298 286 259    Medications:    . amLODipine  10 mg Oral Daily  . carvedilol  25 mg Oral BID WC  . Chlorhexidine Gluconate Cloth  6 each Topical Daily  . cloNIDine  0.2 mg Oral TID  . heparin      . heparin  5,000 Units Subcutaneous Q8H  . insulin aspart  0-15 Units Subcutaneous Q4H  . polyethylene glycol  17 g Oral BID  . senna  1 tablet Oral Daily   Jinx Gilden A Cara Aguino 02/12/2019, 9:41 AM

## 2019-02-12 NOTE — Progress Notes (Signed)
PT Cancellation Note  Patient Details Name: [name] MRN: [id number] DOB: [birth year]   Cancelled Treatment:    Reason Eval/Treat Not Completed: (P) Patient at procedure or test/unavailable(Pt off unit for dialysis.  Will continue efforts per POC.)   Krystal Teachey J Stann Mainland 02/12/2019, 9:55 AM Governor Rooks, PTA Acute Rehabilitation Services Pager [phone] Office [phone]

## 2019-02-12 NOTE — Progress Notes (Signed)
Pt declined PT session reports fatigue from dialysis and OT session.  Offered to perform exercises in bed due to her feeling fatigued and she continued to politely decline.

## 2019-02-13 DIAGNOSIS — I161 Hypertensive emergency: Secondary | ICD-10-CM | POA: Diagnosis not present

## 2019-02-13 DIAGNOSIS — N179 Acute kidney failure, unspecified: Secondary | ICD-10-CM | POA: Diagnosis not present

## 2019-02-13 LAB — GLUCOSE, CAPILLARY
Glucose-Capillary: 113 mg/dL — ABNORMAL HIGH (ref 70–99)
Glucose-Capillary: 117 mg/dL — ABNORMAL HIGH (ref 70–99)
Glucose-Capillary: 145 mg/dL — ABNORMAL HIGH (ref 70–99)
Glucose-Capillary: 165 mg/dL — ABNORMAL HIGH (ref 70–99)
Glucose-Capillary: 78 mg/dL (ref 70–99)
Glucose-Capillary: 89 mg/dL (ref 70–99)

## 2019-02-13 LAB — RENAL FUNCTION PANEL
Albumin: 2.4 g/dL — ABNORMAL LOW (ref 3.5–5.0)
Anion gap: 11 (ref 5–15)
BUN: 43 mg/dL — ABNORMAL HIGH (ref 6–20)
CO2: 24 mmol/L (ref 22–32)
Calcium: 8.7 mg/dL — ABNORMAL LOW (ref 8.9–10.3)
Chloride: 105 mmol/L (ref 98–111)
Creatinine, Ser: 8.07 mg/dL — ABNORMAL HIGH (ref 0.44–1.00)
GFR calc Af Amer: 6 mL/min — ABNORMAL LOW (ref >60)
GFR calc non Af Amer: 5 mL/min — ABNORMAL LOW (ref >60)
Glucose, Bld: 115 mg/dL — ABNORMAL HIGH (ref 70–99)
Phosphorus: 5.1 mg/dL — ABNORMAL HIGH (ref 2.5–4.6)
Potassium: 3.5 mmol/L (ref 3.5–5.1)
Sodium: 140 mmol/L (ref 135–145)

## 2019-02-13 LAB — PTH, INTACT AND CALCIUM
Calcium, Total (PTH): 8.8 mg/dL (ref 8.7–10.3)
PTH: 221 pg/mL — ABNORMAL HIGH (ref 15–65)

## 2019-02-13 LAB — CBC WITH DIFFERENTIAL/PLATELET
Abs Immature Granulocytes: 0.1 10*3/uL — ABNORMAL HIGH (ref 0.00–0.07)
Basophils Absolute: 0 10*3/uL (ref 0.0–0.1)
Basophils Relative: 0 %
Eosinophils Absolute: 0.3 10*3/uL (ref 0.0–0.5)
Eosinophils Relative: 2 %
HCT: 29.6 % — ABNORMAL LOW (ref 36.0–46.0)
Hemoglobin: 9.5 g/dL — ABNORMAL LOW (ref 12.0–15.0)
Immature Granulocytes: 1 %
Lymphocytes Relative: 18 %
Lymphs Abs: 2 10*3/uL (ref 0.7–4.0)
MCH: 26.7 pg (ref 26.0–34.0)
MCHC: 32.1 g/dL (ref 30.0–36.0)
MCV: 83.1 fL (ref 80.0–100.0)
Monocytes Absolute: 1 10*3/uL (ref 0.1–1.0)
Monocytes Relative: 9 %
Neutro Abs: 7.4 10*3/uL (ref 1.7–7.7)
Neutrophils Relative %: 70 %
Platelets: 198 10*3/uL (ref 150–400)
RBC: 3.56 MIL/uL — ABNORMAL LOW (ref 3.87–5.11)
RDW: 15.5 % (ref 11.5–15.5)
WBC: 10.8 10*3/uL — ABNORMAL HIGH (ref 4.0–10.5)
nRBC: 0.6 % — ABNORMAL HIGH (ref 0.0–0.2)

## 2019-02-13 MED ORDER — DIPHENHYDRAMINE HCL 25 MG PO CAPS
25.0000 mg | ORAL_CAPSULE | Freq: Once | ORAL | Status: AC
  Administered 2019-02-13: 25 mg via ORAL
  Filled 2019-02-13: qty 1

## 2019-02-13 MED ORDER — WHITE PETROLATUM EX OINT: TOPICAL_OINTMENT | CUTANEOUS | Status: DC | PRN

## 2019-02-13 NOTE — Progress Notes (Addendum)
Family Medicine Teaching Service Daily Progress Note Intern Pager: [phone]  Patient name: [name] Medical record number: [id number] Date of birth: 1960/12/13 Age: 58 y.o. Gender: female  Primary Care Provider: Matilde Haymaker, MD Consultants: CCM, Nephrology, IR  Code Status: Full   Pt Overview and Major Events to Date:  10/02- Admitted to ICU for Hypertensive emergency 10/06- HD cath placement 10/07-HD   Assessment and Plan: 58yo F with h/o T2DM, GERD, HLD, HTN, CKD presented with persistent emesis, chest pain, and headache found to be in hypertensive emergency in ED in setting of missed doses of BP meds.  Hypertensive emergency-improving Hypertensive. BP 167/80.  BP improved after HD -Continue current meds Amlodipine 10mg  OD, Carvedilol 25mg  BID, Clonidine 0.2mg  daily -Holding BP meds on HD days -Repeat BMP in am  Kidney injury secondary to HTN emergency requiring dialysis Current Cr 8.07, 10.01 on 10/9  Urine output 800 mls on 10/9. -avoid nephrotoxic meds  -Nephro recommendations, appreciate recommendations    Leukocytosis-improving WBCs trended at 12.8>13.6>10.8.  Pt is afebrile. Vital signs stable.  No obvious signs of infection. Unclear etiology at this time of initial cause -repeat CBC  -CBC daily and monitor for signs and symptoms of infection.  Perinephric hemorrhage-stable -monitor for signs of reoccurance  Type 2 Diabetes-stable  CBG stable overnight, in the range of 78-171. Received 3U Novolog ON -Home med Metformin on hold -Moderate s/s for coverage  Acute on chronic anemia of CKD-stable -monitor cbc q2d  Pruritis Patient reported continued pruritus especially after dialysis sessions.  Unclear etiology at this time.  No rashes present.  Can consider uremic pruritus.  This should improve with dialysis sessions.  Will defer to nephrology on treatment. -continue to monitor -nephrology consulted for HD, appreciate recommendations  -can use topical  emollients -if not responsive to HD and topical agents can trial hydroxyzine or diphenhydramine   FEN/GI: renal diet PPx: heparin  Disposition: continued inpatient stay for HD and pending nephrology recommendations   Subjective:  Patient states that she is overall doing well.  Is very appreciative the great care she has had here in the hospital.  Patient states that her only complaint is that she is very itchy.  Denies any rashes.  States that it occurred after her dialysis session.  I informed her that she should inform the nephrologist of this.  Objective: Temp:  [98.3 F (36.8 C)-98.8 F (37.1 C)] 98.5 F (36.9 C) (10/10 0447) Pulse Rate:  [72-90] 73 (10/10 0500) Resp:  [16-33] 23 (10/10 0500) BP: (155-217)/(68-99) 167/80 (10/10 0443) SpO2:  [96 %-100 %] 100 % (10/10 0500) Weight:  [115.8 kg-116.3 kg] 115.8 kg (10/10 0447) Physical Exam: General: awake and alert, laying in bed, NAD  Cardiovascular: RRR, no MRG Respiratory: CTAB, no wheezes, rales, or rhonchi  Abdomen: soft, non tender, non distended, bowel sounds present  Extremities: no edema, non tender Skin: no signs of infections around HD catheter site   Laboratory: Recent Labs  Lab 02/11/19 1250 02/12/19 0318 02/13/19 0150  WBC 12.8* 13.6* 10.8*  HGB 9.4* 9.6* 9.5*  HCT 28.2* 29.8* 29.6*  PLT 286 259 198   Recent Labs  Lab 02/11/19 0225 02/12/19 0318 02/13/19 0150  NA 140 142 140  K 3.8 3.8 3.5  CL 104 105 105  CO2 22 20* 24  BUN 58* 60* 43*  CREATININE 9.38* 10.01* 8.07*  CALCIUM 8.8* 8.8* 8.7*  GLUCOSE 91 100* 115*    Imaging/Diagnostic Tests: No new imaging   Caroline More, DO  02/13/2019, 6:21 AM PGY-3, [place] Intern pager: [phone], text pages welcome

## 2019-02-13 NOTE — Progress Notes (Signed)
Patient ID: [name], female   DOB: August 04, 1960, 58 y.o.   MRN: [id number] [facility] Progress Note   Assessment/ Plan:    1.  Acute kidney injury on chronic kidney disease stage IV:  In July crt over 3. The acute change of late appears to be hemodynamically mediated with hypertensive emergency.  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 Has required initiation of HD this admit Tunneled dialysis catheter on 10/6 with IR and started HD early AM on 10/7 - 2nd treatment on 10/9  - Following to determine length of dialysis need -I suspect is at ESRD given appearance of biopsy and crt 10 in lady with not much muscle mass but will watch over the weekend- making plenty of urine- if will require HD into next week will get vein map, access and CLIP for OP HD   2.  Hypertensive emergency: With acute kidney injury on chronic kidney disease, blood pressures better now with oral antihypertensive therapy.  Avoid hypotension.   amlodipine 10/coreg 25 BID, clonidine 0.2 TID and UF with HD - adequate for now 150-160 no change in regimen  3.  Diabetes mellitus: Hold/discontinue metformin indefinitely with her current GFR.  regimen per primary team   4.  Leukocytosis:  afebrile.  Antibiotics per primary team.  Spoke with team - they are trending better   5. Anemia secondary in part to CKD.  Hb stable.  On ESA and s/p feraheme   6.  Bone mineral disease - hyperphos - improving with HD.  Check PTH- pending   Subjective:   At least 800 of UOP - labs reflective of HD yesterday - BP still kind of high but good for her.  She thinks she is doing well     Objective:   BP (!) 169/76   Pulse 73   Temp 98.2 F (36.8 C) (Oral)   Resp (!) 28   Wt 115.8 kg   SpO2 100%   BMI 37.70 kg/m   Intake/Output Summary (Last 24 hours) at 02/13/2019 1154 Last data filed at 02/12/2019 1900 Gross per 24 hour   Intake 240 ml  Output 800 ml  Net -560 ml   Weight change: -1.7 kg  Physical Exam:    Gen: adult female in bed in NAD at rest    CVS: S1S2; no rub Resp: clear to auscultation anteriorly; normal work of breathing at rest on room air  Abd: Soft, obese, nontender Ext: trace to 1+ edema lower extremities  Neuro alert and oriented x 3; provides hx Psych normal mood and affect  Access Right chest tunneled catheter   Imaging: No results found.  Labs: BMET Recent Labs  Lab 02/07/19 0307 02/08/19 0454 02/09/19 0334 02/10/19 0428 02/11/19 0225 02/12/19 0318 02/13/19 0150  NA 136 136 139 138 140 142 140  K 3.9 4.0 4.0 3.7 3.8 3.8 3.5  CL 107 105 108 105 104 105 105  CO2 19* 18* 18* 21* 22 20* 24  GLUCOSE 113* 127* 108* 100* 91 100* 115*  BUN 61* 68* 70* 51* 58* 60* 43*  CREATININE 7.95* 9.25* 10.23* 8.26* 9.38* 10.01* 8.07*  CALCIUM 8.1* 8.4* 8.6* 8.5* 8.8* 8.8* 8.7*  PHOS 6.2* 6.6* 7.4* 5.3* 6.2* 5.9* 5.1*   CBC Recent Labs  Lab 02/10/19 0428 02/11/19 1250 02/12/19 0318 02/13/19 0150  WBC 16.5* 12.8* 13.6* 10.8*  NEUTROABS 14.4* 9.5* 10.2* 7.4  HGB 10.0* 9.4*  9.6* 9.5*  HCT 30.5* 28.2* 29.8* 29.6*  MCV 80.9 81.7 82.5 83.1  PLT 298 286 259 198    Medications:    . amLODipine  10 mg Oral Daily  . carvedilol  25 mg Oral BID WC  . Chlorhexidine Gluconate Cloth  6 each Topical Daily  . cloNIDine  0.2 mg Oral TID  . heparin  5,000 Units Subcutaneous Q8H  . insulin aspart  0-15 Units Subcutaneous Q4H  . polyethylene glycol  17 g Oral BID  . senna  1 tablet Oral Daily   Jeramyah Goodpasture A Anali Cabanilla 02/13/2019, 11:54 AM

## 2019-02-14 DIAGNOSIS — N179 Acute kidney failure, unspecified: Secondary | ICD-10-CM | POA: Diagnosis not present

## 2019-02-14 DIAGNOSIS — I161 Hypertensive emergency: Secondary | ICD-10-CM | POA: Diagnosis not present

## 2019-02-14 LAB — GLUCOSE, CAPILLARY
Glucose-Capillary: 102 mg/dL — ABNORMAL HIGH (ref 70–99)
Glucose-Capillary: 113 mg/dL — ABNORMAL HIGH (ref 70–99)
Glucose-Capillary: 147 mg/dL — ABNORMAL HIGH (ref 70–99)
Glucose-Capillary: 159 mg/dL — ABNORMAL HIGH (ref 70–99)
Glucose-Capillary: 205 mg/dL — ABNORMAL HIGH (ref 70–99)
Glucose-Capillary: 84 mg/dL (ref 70–99)

## 2019-02-14 LAB — CBC WITH DIFFERENTIAL/PLATELET
Abs Immature Granulocytes: 0.08 10*3/uL — ABNORMAL HIGH (ref 0.00–0.07)
Basophils Absolute: 0 10*3/uL (ref 0.0–0.1)
Basophils Relative: 0 %
Eosinophils Absolute: 0.3 10*3/uL (ref 0.0–0.5)
Eosinophils Relative: 4 %
HCT: 29.6 % — ABNORMAL LOW (ref 36.0–46.0)
Hemoglobin: 9.1 g/dL — ABNORMAL LOW (ref 12.0–15.0)
Immature Granulocytes: 1 %
Lymphocytes Relative: 23 %
Lymphs Abs: 2.1 10*3/uL (ref 0.7–4.0)
MCH: 26.1 pg (ref 26.0–34.0)
MCHC: 30.7 g/dL (ref 30.0–36.0)
MCV: 85.1 fL (ref 80.0–100.0)
Monocytes Absolute: 1 10*3/uL (ref 0.1–1.0)
Monocytes Relative: 10 %
Neutro Abs: 6 10*3/uL (ref 1.7–7.7)
Neutrophils Relative %: 62 %
Platelets: 152 10*3/uL (ref 150–400)
RBC: 3.48 MIL/uL — ABNORMAL LOW (ref 3.87–5.11)
RDW: 15.6 % — ABNORMAL HIGH (ref 11.5–15.5)
WBC: 9.5 10*3/uL (ref 4.0–10.5)
nRBC: 0.2 % (ref 0.0–0.2)

## 2019-02-14 LAB — RENAL FUNCTION PANEL
Albumin: 2.5 g/dL — ABNORMAL LOW (ref 3.5–5.0)
Anion gap: 13 (ref 5–15)
BUN: 47 mg/dL — ABNORMAL HIGH (ref 6–20)
CO2: 25 mmol/L (ref 22–32)
Calcium: 8.9 mg/dL (ref 8.9–10.3)
Chloride: 104 mmol/L (ref 98–111)
Creatinine, Ser: 9.51 mg/dL — ABNORMAL HIGH (ref 0.44–1.00)
GFR calc Af Amer: 5 mL/min — ABNORMAL LOW (ref >60)
GFR calc non Af Amer: 4 mL/min — ABNORMAL LOW (ref >60)
Glucose, Bld: 78 mg/dL (ref 70–99)
Phosphorus: 5.7 mg/dL — ABNORMAL HIGH (ref 2.5–4.6)
Potassium: 3.7 mmol/L (ref 3.5–5.1)
Sodium: 142 mmol/L (ref 135–145)

## 2019-02-14 MED ORDER — CHLORHEXIDINE GLUCONATE CLOTH 2 % EX PADS
6.0000 | MEDICATED_PAD | Freq: Every day | CUTANEOUS | Status: DC
  Administered 2019-02-15: 6 via TOPICAL

## 2019-02-14 MED ORDER — GABAPENTIN 100 MG PO CAPS
100.0000 mg | ORAL_CAPSULE | Freq: Every day | ORAL | Status: DC
  Administered 2019-02-14: 100 mg via ORAL
  Filled 2019-02-14: qty 1

## 2019-02-14 NOTE — Progress Notes (Signed)
Patient ID: [name], female   DOB: October 12, 1960, 58 y.o.   MRN: [id number] [facility] Progress Note   Assessment/ Plan:    1.  Acute kidney injury on chronic kidney disease stage IV:  In July crt over 3. The acute change of late appears to be hemodynamically mediated with hypertensive emergency.  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 Has required initiation of HD this admit Tunneled dialysis catheter on 10/6 with IR and started HD early AM on 10/7 - 2nd treatment on 10/9  - Following to determine length of dialysis need -I suspect is at ESRD given appearance of biopsy and crt 10 in lady with not much muscle mass but will watch over the weekend- making reasonable urine but not much clearance-  will need to go ahead and get vein map, access and CLIP for OP HD.  Orders for next HD written for tomorrow but will look at labs first, might be able to go until Tuesday   2.  Hypertensive emergency: With acute kidney injury on chronic kidney disease, blood pressures better now with oral antihypertensive therapy.  Avoid hypotension.   amlodipine 10/coreg 25 BID, clonidine 0.2 TID and UF with HD/good UOP - adequate for now 150-160 no change in regimen  3.  Diabetes mellitus: Hold/discontinue metformin indefinitely with her current GFR.  regimen per primary team   4.  Leukocytosis:  afebrile.  Antibiotics per primary team.  Spoke with team - they are trending better   5. Anemia secondary in part to CKD.  Hb stable.  On ESA and s/p feraheme   6.  Bone mineral disease - hyperphos - improving with HD.   PTH 221- no binders or meds yet   Subjective:   No UOP recorded  - labs worsening off of HD - BP still kind of high but good for her.  She thinks she is doing well     Objective:   BP (!) 152/71   Pulse 73   Temp 98.3 F (36.8 C) (Oral)   Resp 19   Wt 118.7 kg   SpO2 99%   BMI  38.64 kg/m   Intake/Output Summary (Last 24 hours) at 02/14/2019 1247 Last data filed at 02/14/2019 0435 Gross per 24 hour  Intake -  Output 1 ml  Net -1 ml   Weight change: 2.9 kg  Physical Exam:    Gen: adult female in bed in NAD at rest    CVS: S1S2; no rub Resp: clear to auscultation anteriorly; normal work of breathing at rest on room air  Abd: Soft, obese, nontender Ext: trace to 1+ edema lower extremities  Neuro alert and oriented x 3; provides hx Psych normal mood and affect  Access Right chest tunneled catheter   Imaging: No results found.  Labs: BMET Recent Labs  Lab 02/08/19 0454 02/09/19 0334 02/10/19 0428 02/11/19 0225 02/12/19 0318 02/13/19 0150 02/14/19 0309  NA 136 139 138 140 142 140 142  K 4.0 4.0 3.7 3.8 3.8 3.5 3.7  CL 105 108 105 104 105 105 104  CO2 18* 18* 21* 22 20* 24 25  GLUCOSE 127* 108* 100* 91 100* 115* 78  BUN 68* 70* 51* 58* 60* 43* 47*  CREATININE 9.25* 10.23* 8.26* 9.38* 10.01* 8.07* 9.51*  CALCIUM 8.4* 8.6* 8.5* 8.8* 8.8*  8.8 8.7* 8.9  PHOS 6.6* 7.4* 5.3* 6.2* 5.9* 5.1* 5.7*   CBC  Recent Labs  Lab 02/11/19 1250 02/12/19 0318 02/13/19 0150 02/14/19 0309  WBC 12.8* 13.6* 10.8* 9.5  NEUTROABS 9.5* 10.2* 7.4 6.0  HGB 9.4* 9.6* 9.5* 9.1*  HCT 28.2* 29.8* 29.6* 29.6*  MCV 81.7 82.5 83.1 85.1  PLT 286 259 198 152    Medications:    . amLODipine  10 mg Oral Daily  . carvedilol  25 mg Oral BID WC  . Chlorhexidine Gluconate Cloth  6 each Topical Daily  . cloNIDine  0.2 mg Oral TID  . gabapentin  100 mg Oral QHS  . heparin  5,000 Units Subcutaneous Q8H  . insulin aspart  0-15 Units Subcutaneous Q4H  . polyethylene glycol  17 g Oral BID  . senna  1 tablet Oral Daily   Lennon Richins A Taige Housman 02/14/2019, 12:47 PM

## 2019-02-14 NOTE — Progress Notes (Addendum)
Family Medicine Teaching Service Daily Progress Note Intern Pager: [phone]  Patient name: [name] Medical record number: [id number] Date of birth: 1960-07-10 Age: 58 y.o. Gender: female  Primary Care Provider: Matilde Haymaker, MD Consultants: CCM, Nephrology, IR  Code Status: Full   Pt Overview and Major Events to Date:  10/02- Admitted to ICU for Hypertensive emergency 10/06- HD cath placement 10/07-HD started   Assessment and Plan: 58yo F with h/o T2DM, GERD, HLD, HTN, CKD presented with persistent emesis, chest pain, and headache found to be in hypertensive emergency in ED in setting of missed doses of BP meds.  Hypertensive emergency-improving Blood pressure 128-149/62-70 -Continue current meds Amlodipine 10mg  OD, Carvedilol 25mg  BID, Clonidine 0.2mg  daily -Holding BP meds on HD days -Repeat BMP in am  Kidney injury secondary to HTN emergency requiring dialysis Creatinine 9.51, increased 8.07 yesterday.  No recorded urine output over 24 hours.  Last dialysis 10/10 -avoid nephrotoxic meds  -Nephro recommendations, appreciate recommendations    Leukocytosis-improving WBCs trended at 12.8>13.6>10.8>9.5.  Patient continues to be febrile.  Vital signs stable -CBC q2d and monitor for signs and symptoms of infection.  Perinephric hemorrhage-stable -monitor for signs of reoccurance  Type 2 Diabetes-stable  CBG stable overnight, in the range of 78-171. Received 2u insulin overnight -Home med Metformin on hold -Moderate s/s for coverage  Anemia of CKD-stable Hbg stable.  On ESA and Feraheme per nephro. -monitor cbc q2d  Pruritis No complaints of itchiness today.  Given Benadryl 25 mg overnight -Continue to monitor -Gabapentin 100 mg qhs -Nephrology following, appreciate recommendations   FEN/GI: renal diet PPx: heparin  Disposition: continued inpatient stay for HD and pending nephrology recommendations   Subjective:  Patient states that she is feeling good.   Still having some itching, no PRN meds given last night.  Last dose Benadryl 10/10 at midnight.  Denies has any chest pain, shortness of breath.  Good p.o. intake.  Voiding well.     Objective: Temp:  [98 F (36.7 C)-98.8 F (37.1 C)] 98.8 F (37.1 C) (10/11 0748) Pulse Rate:  [65-98] 73 (10/11 0822) Resp:  [16-28] 19 (10/11 0748) BP: (128-158)/(62-75) 152/71 (10/11 0822) SpO2:  [96 %-100 %] 99 % (10/11 0748) Weight:  [118.7 kg] 118.7 kg (10/11 0434)   Physical Exam: General: Sitting in bed watching TV, no apparent distress  Cardiovascular: RRR with no murmurs appreciated, no gallops, no rubs, distal pulses present Respiratory: Clear to auscultation bilaterally, no crackles, no rhonchi, cap refill  Gastrointestinal: Bowel sounds present. No abdominal pain MSK: Moving all extremities, lower extremity edema significantly improved Derm: No rashes noted, HD cath site dressing dry and intact, no erythema or drainage noted Neuro: Alert and orientated x4 Psych: Behavior and speech appropriate to situation    Laboratory: Recent Labs  Lab 02/12/19 0318 02/13/19 0150 02/14/19 0309  WBC 13.6* 10.8* 9.5  HGB 9.6* 9.5* 9.1*  HCT 29.8* 29.6* 29.6*  PLT 259 198 152   Recent Labs  Lab 02/12/19 0318 02/13/19 0150 02/14/19 0309  NA 142 140 142  K 3.8 3.5 3.7  CL 105 105 104  CO2 20* 24 25  BUN 60* 43* 47*  CREATININE 10.01* 8.07* 9.51*  CALCIUM 8.8*  8.8 8.7* 8.9  GLUCOSE 100* 115* 78    Imaging/Diagnostic Tests: No new imaging   Carollee Leitz, MD 02/14/2019, 9:15 AM PGY-1, [place] Intern pager: [phone], text pages welcome

## 2019-02-14 NOTE — Progress Notes (Signed)
Received page from patient's nurse stating that patient's telemetry had shown an occasional ventricular bigeminy.  Patient's nurse stated that this only occurred for a short bout and the patient was asymptomatic during this time. Went to evaluate patient and found her resting comfortably in bed watching TV without distress. Telemetry showed normal sinus rhythm at this time.  Physical Exam: General: Alert and oriented in no apparent distress Heart: Regular rate and rhythm with no murmurs appreciated Lungs: Breathing comfortably on room air, no respiratory distress, CTA    Will continue to monitor.  Lurline Del, DO

## 2019-02-15 ENCOUNTER — Encounter (HOSPITAL_COMMUNITY): Payer: Self-pay

## 2019-02-15 ENCOUNTER — Encounter (HOSPITAL_COMMUNITY): Payer: BC Managed Care – PPO

## 2019-02-15 LAB — MAGNESIUM: Magnesium: 2.2 mg/dL (ref 1.7–2.4)

## 2019-02-15 LAB — RENAL FUNCTION PANEL
Albumin: 2.6 g/dL — ABNORMAL LOW (ref 3.5–5.0)
Anion gap: 12 (ref 5–15)
BUN: 53 mg/dL — ABNORMAL HIGH (ref 6–20)
CO2: 23 mmol/L (ref 22–32)
Calcium: 8.7 mg/dL — ABNORMAL LOW (ref 8.9–10.3)
Chloride: 106 mmol/L (ref 98–111)
Creatinine, Ser: 10.11 mg/dL — ABNORMAL HIGH (ref 0.44–1.00)
GFR calc Af Amer: 4 mL/min — ABNORMAL LOW (ref >60)
GFR calc non Af Amer: 4 mL/min — ABNORMAL LOW (ref >60)
Glucose, Bld: 98 mg/dL (ref 70–99)
Phosphorus: 5.7 mg/dL — ABNORMAL HIGH (ref 2.5–4.6)
Potassium: 3.8 mmol/L (ref 3.5–5.1)
Sodium: 141 mmol/L (ref 135–145)

## 2019-02-15 LAB — CBC
HCT: 30.2 % — ABNORMAL LOW (ref 36.0–46.0)
Hemoglobin: 9.5 g/dL — ABNORMAL LOW (ref 12.0–15.0)
MCH: 26.7 pg (ref 26.0–34.0)
MCHC: 31.5 g/dL (ref 30.0–36.0)
MCV: 84.8 fL (ref 80.0–100.0)
Platelets: 65 10*3/uL — ABNORMAL LOW (ref 150–400)
RBC: 3.56 MIL/uL — ABNORMAL LOW (ref 3.87–5.11)
RDW: 15.6 % — ABNORMAL HIGH (ref 11.5–15.5)
WBC: 12.9 10*3/uL — ABNORMAL HIGH (ref 4.0–10.5)
nRBC: 0 % (ref 0.0–0.2)

## 2019-02-15 LAB — GLUCOSE, CAPILLARY
Glucose-Capillary: 100 mg/dL — ABNORMAL HIGH (ref 70–99)
Glucose-Capillary: 102 mg/dL — ABNORMAL HIGH (ref 70–99)
Glucose-Capillary: 95 mg/dL (ref 70–99)
Glucose-Capillary: 126 mg/dL — ABNORMAL HIGH (ref 70–99)
Glucose-Capillary: 145 mg/dL — ABNORMAL HIGH (ref 70–99)

## 2019-02-15 LAB — SURGICAL PATHOLOGY

## 2019-02-15 LAB — HEPATITIS B E ANTIBODY: Hep B E Ab: NEGATIVE

## 2019-02-15 MED ORDER — HEPARIN SODIUM (PORCINE) 1000 UNIT/ML DIALYSIS
20.0000 [IU]/kg | INTRAMUSCULAR | Status: DC | PRN
  Administered 2019-02-15: 3200 [IU] via INTRAVENOUS_CENTRAL
  Filled 2019-02-15: qty 2.4

## 2019-02-15 MED ORDER — GABAPENTIN 100 MG PO CAPS
100.0000 mg | ORAL_CAPSULE | Freq: Every evening | ORAL | Status: DC | PRN
  Filled 2019-02-15: qty 1

## 2019-02-15 MED ORDER — HEPARIN SODIUM (PORCINE) 1000 UNIT/ML IJ SOLN
INTRAMUSCULAR | Status: AC
  Filled 2019-02-15: qty 4

## 2019-02-15 NOTE — Progress Notes (Signed)
PT Cancellation Note  Patient Details Name: [name] MRN: [id number] DOB: Oct 16, 1960   Cancelled Treatment:    Reason Eval/Treat Not Completed: Patient at procedure or test/unavailable. Pt currently off unit for HD. Will check back as schedule allows to continue with PT POC.    Thelma Comp 02/15/2019, 11:53 AM   Rolinda Roan, PT, DPT Acute Rehabilitation Services Pager: [phone] Office: [phone]

## 2019-02-15 NOTE — Progress Notes (Addendum)
Patient ID: [name], female   DOB: 09-Oct-1960, 58 y.o.   MRN: [id number] S: No complaints O:BP (!) 163/83 (BP Location: Right Arm)   Pulse 70   Temp 97.8 F (36.6 C) (Oral)   Resp (!) 22   Wt 117.3 kg   SpO2 98%   BMI 38.19 kg/m   Intake/Output Summary (Last 24 hours) at 02/15/2019 1235 Last data filed at 02/15/2019 0537 Gross per 24 hour  Intake 680 ml  Output 500 ml  Net 180 ml   Intake/Output: I/O last 3 completed shifts: In: 800 [P.O.:800] Out: 501 [Urine:501]  Intake/Output this shift:  No intake/output data recorded. Weight change:  Gen: seen on HD in NAD CVS: no rub Resp: cta Abd: +BS, soft, NT Ext: trace edema  Recent Labs  Lab 02/09/19 0334 02/10/19 0428 02/11/19 0225 02/12/19 0318 02/13/19 0150 02/14/19 0309 02/15/19 0319  NA 139 138 140 142 140 142 141  K 4.0 3.7 3.8 3.8 3.5 3.7 3.8  CL 108 105 104 105 105 104 106  CO2 18* 21* 22 20* 24 25 23   GLUCOSE 108* 100* 91 100* 115* 78 98  BUN 70* 51* 58* 60* 43* 47* 53*  CREATININE 10.23* 8.26* 9.38* 10.01* 8.07* 9.51* 10.11*  ALBUMIN 2.5* 2.7* 2.5* 2.6* 2.4* 2.5* 2.6*  CALCIUM 8.6* 8.5* 8.8* 8.8*  8.8 8.7* 8.9 8.7*  PHOS 7.4* 5.3* 6.2* 5.9* 5.1* 5.7* 5.7*   Liver Function Tests: Recent Labs  Lab 02/13/19 0150 02/14/19 0309 02/15/19 0319  ALBUMIN 2.4* 2.5* 2.6*   No results for input(s): LIPASE, AMYLASE in the last 168 hours. No results for input(s): AMMONIA in the last 168 hours. CBC: Recent Labs  Lab 02/10/19 0428 02/11/19 1250 02/12/19 0318 02/13/19 0150 02/14/19 0309  WBC 16.5* 12.8* 13.6* 10.8* 9.5  NEUTROABS 14.4* 9.5* 10.2* 7.4 6.0  HGB 10.0* 9.4* 9.6* 9.5* 9.1*  HCT 30.5* 28.2* 29.8* 29.6* 29.6*  MCV 80.9 81.7 82.5 83.1 85.1  PLT 298 286 259 198 152   Cardiac Enzymes: No results for input(s): CKTOTAL, CKMB, CKMBINDEX, TROPONINI in the last 168 hours. CBG: Recent Labs  Lab 02/14/19 1622 02/14/19 2010 02/15/19 0021 02/15/19 0440 02/15/19 0755  GLUCAP 205* 113* 100*  102* 95    Iron Studies: No results for input(s): IRON, TIBC, TRANSFERRIN, FERRITIN in the last 72 hours. Studies/Results: No results found. Marland Kitchen amLODipine  10 mg Oral Daily  . carvedilol  25 mg Oral BID WC  . Chlorhexidine Gluconate Cloth  6 each Topical Daily  . Chlorhexidine Gluconate Cloth  6 each Topical Q0600  . cloNIDine  0.2 mg Oral TID  . heparin  5,000 Units Subcutaneous Q8H  . insulin aspart  0-15 Units Subcutaneous Q4H  . polyethylene glycol  17 g Oral BID  . senna  1 tablet Oral Daily    BMET    Component Value Date/Time   NA 141 02/15/2019 0319   NA 145 (H) 11/12/2018 0949   K 3.8 02/15/2019 0319   CL 106 02/15/2019 0319   CO2 23 02/15/2019 0319   GLUCOSE 98 02/15/2019 0319   BUN 53 (H) 02/15/2019 0319   BUN 34 (H) 11/12/2018 0949   CREATININE 10.11 (H) 02/15/2019 0319   CREATININE 1.19 (H) 09/28/2015 1524   CALCIUM 8.7 (L) 02/15/2019 0319   CALCIUM 8.8 02/12/2019 0318   GFRNONAA 4 (L) 02/15/2019 0319   GFRNONAA 51 (L) 09/28/2015 1524   GFRAA 4 (L) 02/15/2019 0319   GFRAA 59 (L) 09/28/2015 1524  CBC    Component Value Date/Time   WBC 9.5 02/14/2019 0309   RBC 3.48 (L) 02/14/2019 0309   HGB 9.1 (L) 02/14/2019 0309   HGB 11.4 11/12/2018 0949   HCT 29.6 (L) 02/14/2019 0309   HCT 34.6 11/12/2018 0949   PLT 152 02/14/2019 0309   PLT 404 11/12/2018 0949   MCV 85.1 02/14/2019 0309   MCV 78 (L) 11/12/2018 0949   MCH 26.1 02/14/2019 0309   MCHC 30.7 02/14/2019 0309   RDW 15.6 (H) 02/14/2019 0309   RDW 15.0 11/12/2018 0949   LYMPHSABS 2.1 02/14/2019 0309   MONOABS 1.0 02/14/2019 0309   EOSABS 0.3 02/14/2019 0309   BASOSABS 0.0 02/14/2019 0309     Assessment/Plan:  1. AKI/CKD stage 4 now ESRD- due to hypertensive emergency.  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  Started on HD 02/10/19.  BUN/Cr cont to climb between treatments.  She will need AVF/AVG placement as well  as CLIP for OP HD. 2. Hypertensive emergency- improved with meds and HD. 3. DM- off of metformin, management per primary svc 4. Anemia of CKD- on ESA and s/p feraheme 5. Secondary HPTH- stable without meds for now.  6. Disposition- will need outpatient HD to be arranged and AVF/AVG placement prior to discharge.   Donetta Potts, MD [employer] [phone]

## 2019-02-15 NOTE — Progress Notes (Addendum)
Family Medicine Teaching Service Daily Progress Note Intern Pager: [phone]  Patient name: [name] Medical record number: [id number] Date of birth: 11-May-1960 Age: 58 y.o. Gender: female  Primary Care Provider: Matilde Haymaker, MD Consultants: CCM, Nephrology, IR  Code Status: Full   Pt Overview and Major Events to Date:  10/02- Admitted to ICU for Hypertensive emergency 10/06- HD cath placement 10/07-HD started   Assessment and Plan: 58yo F with h/o T2DM, GERD, HLD, HTN, CKD presented with persistent emesis, chest pain, and headache found to be in hypertensive emergency in ED in setting of missed doses of BP meds.  Hypertensive emergency-improving Blood pressure overnight 149- 168/77-82.  Requiring no PRN medication overnight -Continue current meds amlodipine, carvedilol, clonidine -Holding BP meds on dialysis days -Repeat BMP in a.m.  Kidney injury secondary to HTN emergency requiring dialysis Creatinine 10.11, increased from 9.51 yesterday.  Urine output overnight 500 cc.  Last dialysis 10/10 -Nephro following, per nephro patient will need vein mapping for outpatient dialysis -For possible dialysis today -Avoid nephrotoxic medication   Leukocytosis-improving Patient afebrile overnight.  Vital signs stable.  WBC is trending. -CBC daily -Monitor for any signs symptoms of infection  Perinephric hemorrhage-stable -monitor for signs of reoccurance  Type 2 Diabetes-stable  CBGs stable overnight ranging 98- 113.  No insulin coverage given overnight -Metformin on hold, consider discontinuing in the setting of end-stage renal disease -Continue moderate sliding scale insulin coverage  Anemia of CKD-stable Asymptomatic.  Patient currently on ESA and Feraheme. -Monitor CBC every 2 days  Pruritis Started gabapentin 100 mg nightly yesterday evening.  Patient appears to have no complaints of itching.  Most likely secondary to dialysis. -Continue to monitor -Continue  gabapentin nightly as needed -Nephro following  Arrhythmia Overnight patient appeared to have occasional ventricular bigeminy.  Telemetry had shown normal sinus rhythm.  Patient reports no palpitations, no chest pain today.  Electrolytes stable. -Add magnesium to previous labs drawn -We will continue to monitor -Obtain EKG if repeat episode  FEN/GI: renal diet PPx: heparin  Disposition: continued inpatient stay for HD and pending nephrology recommendations   Subjective:  No acute events overnight.  Patient feels good.  Denies any chest pain, palpitations, shortness of breath.  Pruritus has resolved.  Allergy to gabapentin last night.  Good p.o. intake.  Voiding well.  Last BM 10/11  Objective: Temp:  [97.8 F (36.6 C)-98.7 F (37.1 C)] 98.1 F (36.7 C) (10/12 0755) Pulse Rate:  [72-76] 75 (10/12 0755) Resp:  [11-33] 18 (10/12 0755) BP: (129-168)/(68-82) 161/68 (10/12 0755) SpO2:  [91 %-99 %] 96 % (10/12 0755)   Physical Exam: General: Alert and oriented, no apparent distress  Cardiovascular: RRR with no murmurs noted Respiratory: CTA bilaterally  Gastrointestinal: Bowel sounds present. No abdominal pain MSK: Moves all extremities, lower extremity edema significantly improved with Derm: No rashes noted, right upper chest HD cath site intact, no erythema, no drainage. Neuro: Orientated x4 Psych: Behavior and speech appropriate to situation   Laboratory: Recent Labs  Lab 02/12/19 0318 02/13/19 0150 02/14/19 0309  WBC 13.6* 10.8* 9.5  HGB 9.6* 9.5* 9.1*  HCT 29.8* 29.6* 29.6*  PLT 259 198 152   Recent Labs  Lab 02/13/19 0150 02/14/19 0309 02/15/19 0319  NA 140 142 141  K 3.5 3.7 3.8  CL 105 104 106  CO2 24 25 23   BUN 43* 47* 53*  CREATININE 8.07* 9.51* 10.11*  CALCIUM 8.7* 8.9 8.7*  GLUCOSE 115* 78 98    Imaging/Diagnostic Tests: No  new imaging   Carollee Leitz, MD 02/15/2019, 9:33 AM PGY-1, [place] Intern pager: [phone], text  pages welcome

## 2019-02-15 NOTE — Progress Notes (Signed)
Family Medicine Teaching Service Daily Progress Note Intern Pager: [phone]  Patient name: [name] Medical record number: [id number] Date of birth: 05-15-1960 Age: 58 y.o. Gender: female  Primary Care Provider: Matilde Haymaker, MD Consultants: CCM, Nephrology, IR  Code Status: Full   Pt Overview and Major Events to Date:  10/02- Admitted to ICU for Hypertensive emergency 10/06- HD cath placement 10/07-HD started   Assessment and Plan: 58yo F with h/o T2DM, GERD, HLD, HTN, CKD presented with persistent emesis, chest pain, and headache found to be in hypertensive emergency in ED in setting of missed doses of BP meds.  Hypertensive emergency-improving BP overnight 147-150/70-75.  Requiring no as needed medications.  Dialysis yesterday -Continue current meds amlodipine, carvedilol, clonidine -Hold BP meds on dialysis days -BMP daily  Kidney injury secondary to HTN emergency requiring dialysis Creatinine 6.81, decreased from 10.11 yesterday.  No documented urine output overnight.  Dialyzed yesterday for 1 L. -Nephro following, patient will need vein mapping for outpatient dialysis -Avoid nephrotoxic agents   Leukocytosis Afebrile overnight.  Postdialysis, WBC 12.9 yesterday, increased from 9.5 -CBC every 2 days -Monitor for signs and symptoms of infection  Perinephric hemorrhage-stable -monitor for signs of reoccurance  Type 2 Diabetes-stable  CBGs overnight range 86- 145.  Required 4 units of insulin coverage yesterday. -Metformin on hold in the setting of end-stage renal disease -Continue moderate sliding scale insulin coverage  Anemia of CKD-stable Asymptomatic.  Patient currently on ESA and Feraheme. -Monitor CBC every 2 days  Pruritis Reports no pruritus. -Continue to monitor -Continue gabapentin nightly as needed -Nephro following  Arrhythmia Resolved -We will continue to monitor   FEN/GI: renal diet PPx: heparin  Disposition: continued inpatient  stay for HD and pending nephrology recommendations   Subjective:  Patient resting comfortably.  States she feels well.  Denies any chest pain, shortness of breath, abdominal pain.  Appetite good.  Bowels yesterday.  Voiced no new concerns.  Objective: Temp:  [98 F (36.7 C)-98.6 F (37 C)] 98.6 F (37 C) (10/13 0816) Pulse Rate:  [70-80] 73 (10/13 0816) Resp:  [16-35] 20 (10/13 0816) BP: (147-172)/(64-85) 154/64 (10/13 0816) SpO2:  [94 %-98 %] 98 % (10/13 0816) Weight:  [115.9 kg] 115.9 kg (10/13 0500)   Physical Exam: General: Pleasant 58 year old female in no apparent distress  Cardiovascular: Regular rate and rhythm, no murmurs appreciated Respiratory: Clear to auscultation bilaterally, no crackles, no rhonchi Gastrointestinal: Soft, nontender, nondistended, bowel sounds present MSK: Moves all extremities, lower remedy edema improving Derm: No rashes noted Neuro: Alert and orientated x3 Psych: Alert and cooperative    Laboratory: Recent Labs  Lab 02/13/19 0150 02/14/19 0309 02/15/19 1654  WBC 10.8* 9.5 12.9*  HGB 9.5* 9.1* 9.5*  HCT 29.6* 29.6* 30.2*  PLT 198 152 65*   Recent Labs  Lab 02/14/19 0309 02/15/19 0319 02/16/19 0253  NA 142 141 138  K 3.7 3.8 3.8  CL 104 106 100  CO2 25 23 25   BUN 47* 53* 27*  CREATININE 9.51* 10.11* 6.81*  CALCIUM 8.9 8.7* 8.8*  GLUCOSE 78 98 95    Imaging/Diagnostic Tests: No new imaging   Carollee Leitz, MD 02/16/2019, 1:52 PM PGY-1, [place] Intern pager: [phone], text pages welcome

## 2019-02-15 NOTE — Plan of Care (Signed)

## 2019-02-15 NOTE — Progress Notes (Signed)
Renal Navigator received notification from Nephrologist/Dr. Moshe Cipro to refer patient for OP HD treatment. Renal Navigator met with patient at bedside who states understanding of referral and that her daughter will take her on any schedule/shift.  Referral completed to [facility] Admissions. Renal Navigator will continue to follow closely and follow up with patient and Nephrologist regarding acceptance and seat schedule.  Alphonzo Cruise, [place] Renal Navigator [phone]

## 2019-02-15 NOTE — Consult Note (Signed)
Hospital Consult    Reason for Consult: New dialysis access evaluation Referring Physician: Nephrology MRN #:  [id number]  History of Present Illness: This is a 58 y.o. female with history of diabetes, hypertension, chronic kidney disease that vascular surgery has been consulted for permanent dialysis access evaluation.  Patient reportedly presented to the ED with hypertensive emergency after she missed several of her blood pressure medicines.  Patient states she is right-handed.  She currently has a right IJ tunneled catheter.  She has never dialyzed in the past.  Still works as a Licensed conveyancer at [employer].  Excited to get back to work.  Seen in dialysis today.  Past Medical History:  Diagnosis Date  . Diabetes mellitus   . GERD (gastroesophageal reflux disease)   . Hyperlipidemia   . Hypertension   . Nausea   . Renal disorder     Past Surgical History:  Procedure Laterality Date  . BREAST SURGERY     reduction  . CESAREAN SECTION     x2  . CHOLECYSTECTOMY     laparoscopic  . IR FLUORO GUIDE CV LINE RIGHT  02/09/2019  . IR US GUIDE VASC ACCESS RIGHT  02/09/2019  . REDUCTION MAMMAPLASTY Bilateral     Allergies  Allergen Reactions  . Lisinopril Anaphylaxis and Other (See Comments)    angioedema  . Jardiance [Empagliflozin] Rash    Prior to Admission medications   Medication Sig Start Date End Date Taking? Authorizing Provider  acetaminophen (TYLENOL) 500 MG tablet Take 1,000 mg by mouth every 6 (six) hours as needed for moderate pain or headache.   Yes [provider]  amLODipine (NORVASC) 10 MG tablet TAKE 1 TABLET BY MOUTH ONCE DAILY Patient taking differently: Take 10 mg by mouth daily.  01/06/18  Yes Diallo, Abdoulaye, MD  aspirin EC 325 MG tablet Take 325 mg by mouth daily.   Yes [provider]  atorvastatin (LIPITOR) 20 MG tablet TAKE 1 TABLET BY MOUTH ONCE DAILY AT  6PM Patient taking differently: Take 20 mg by mouth daily at 6 PM.  09/16/17  Yes  [name], MD  carvedilol (COREG) 25 MG tablet Take 1 tablet (25 mg total) by mouth 2 (two) times daily with a meal. 07/01/18  Yes Diallo, Abdoulaye, MD  cloNIDine (CATAPRES) 0.2 MG tablet Take 0.2 mg by mouth 3 (three) times daily.   Yes [provider]  ferrous sulfate 325 (65 FE) MG tablet Take 325 mg by mouth daily with breakfast.   Yes [provider]  furosemide (LASIX) 40 MG tablet Take 40 mg by mouth 2 (two) times daily.   Yes [provider]  polyethylene glycol (MIRALAX / GLYCOLAX) packet Take 17 g by mouth daily as needed for moderate constipation. 08/06/17  Yes [name], MD  potassium chloride SA (K-DUR) 20 MEQ tablet Take 20 mEq by mouth daily.   Yes [provider]  traMADol (ULTRAM) 50 MG tablet TAKE 1 TABLET BY MOUTH EVERY 8 HOURS AS NEEDED Patient taking differently: Take 50 mg by mouth 2 (two) times daily as needed for moderate pain.  02/17/18  Yes Diallo, Abdoulaye, MD  Vitamin D, Ergocalciferol, (DRISDOL) 1.25 MG (50000 UT) CAPS capsule Take 50,000 Units by mouth every Monday.    Yes [provider]  Accu-Chek Softclix Lancets lancets Use as instructed 09/29/18   Diallo, Earna Coder, MD  Blood Glucose Monitoring Suppl (ACCU-CHEK AVIVA PLUS) w/Device KIT Use to check sugar three times a day 05/14/17  [name], MD  glucose blood (ACCU-CHEK AVIVA PLUS) test strip Use as instructed 09/29/18   Diallo, Earna Coder, MD  Lancets (ACCU-CHEK SOFT TOUCH) lancets Use to check sugars three times a day 09/24/18   Marjie Skiff, MD    Social History   Socioeconomic History  . Marital status: Legally Separated    Spouse name: Not on file  . Number of children: Not on file  . Years of education: Not on file  . Highest education level: Not on file  Occupational History  . Not on file  Social Needs  . Financial resource strain: Not on file  . Food insecurity    Worry: Not on file    Inability: Not on file  .  Transportation needs    Medical: Not on file    Non-medical: Not on file  Tobacco Use  . Smoking status: Former Smoker    Years: 10.00    Types: Cigarettes    Quit date: 10/16/2011    Years since quitting: 7.3  . Smokeless tobacco: Never Used  . Tobacco comment: 1 pack will last a week  Substance and Sexual Activity  . Alcohol use: No    Alcohol/week: 0.0 standard drinks  . Drug use: No  . Sexual activity: Never  Lifestyle  . Physical activity    Days per week: Not on file    Minutes per session: Not on file  . Stress: Not on file  Relationships  . Social Herbalist on phone: Not on file    Gets together: Not on file    Attends religious service: Not on file    Active member of club or organization: Not on file    Attends meetings of clubs or organizations: Not on file    Relationship status: Not on file  . Intimate partner violence    Fear of current or ex partner: Not on file    Emotionally abused: Not on file    Physically abused: Not on file    Forced sexual activity: Not on file  Other Topics Concern  . Not on file  Social History Narrative  . Not on file     Family History  Problem Relation Age of Onset  . Hypertension Mother   . Diabetes Mother   . Cancer Father        lung  . Colon cancer Neg Hx   . Stomach cancer Neg Hx   . Esophageal cancer Neg Hx     ROS: '[x]'$  Positive   '[ ]'$  Negative   '[ ]'$  All sytems reviewed and are negative  Cardiovascular: '[]'$  chest pain/pressure '[]'$  palpitations '[]'$  SOB lying flat '[]'$  DOE '[]'$  pain in legs while walking '[]'$  pain in legs at rest '[]'$  pain in legs at night '[]'$  non-healing ulcers '[]'$  hx of DVT '[]'$  swelling in legs  Pulmonary: '[]'$  productive cough '[]'$  asthma/wheezing '[]'$  home O2  Neurologic: '[]'$  weakness in '[]'$  arms '[]'$  legs '[]'$  numbness in '[]'$  arms '[]'$  legs '[]'$  hx of CVA '[]'$  mini stroke '[]'$ difficulty speaking or slurred speech '[]'$  temporary loss of vision in one eye '[]'$  dizziness  Hematologic: '[]'$  hx of cancer  '[]'$  bleeding problems '[]'$  problems with blood clotting easily  Endocrine:   '[]'$  diabetes '[]'$  thyroid disease  GI '[]'$  vomiting blood '[]'$  blood in stool  GU: '[]'$  CKD/renal failure '[]'$  HD--'[]'$  M/W/F or '[]'$  T/T/S '[]'$  burning with urination '[]'$  blood in urine  Psychiatric: '[]'$  anxiety '[]'$  depression  Musculoskeletal: '[]'$   arthritis '[]'$  joint pain  Integumentary: '[]'$  rashes '[]'$  ulcers  Constitutional: '[]'$  fever '[]'$  chills   Physical Examination  Vitals:   02/15/19 1430 02/15/19 1453  BP: (!) 171/82 (!) 172/84  Pulse: 74 77  Resp:  19  Temp:  98.5 F (36.9 C)  SpO2:  97%   Body mass index is 38.19 kg/m.  General:  WDWN in NAD Gait: Not observed HENT: WNL, normocephalic Pulmonary: normal non-labored breathing, without Rales, rhonchi,  wheezing Cardiac: regular, without  Murmurs, rubs or gallops Abdomen: soft, NT/ND, no masses Skin: without rashes Vascular Exam/Pulses: 2+ radial pulse palpable bilateral 2+ brachial pulse palpable bilateral No tissue loss bilateral upper extremity Extremities: without ischemic changes, without Gangrene , without cellulitis; without open wounds;  Musculoskeletal: no muscle wasting or atrophy  Neurologic: A&O X 3; Appropriate Affect ; SENSATION: normal; MOTOR FUNCTION:  moving all extremities equally. Speech is fluent/normal   CBC    Component Value Date/Time   WBC 9.5 02/14/2019 0309   RBC 3.48 (L) 02/14/2019 0309   HGB 9.1 (L) 02/14/2019 0309   HGB 11.4 11/12/2018 0949   HCT 29.6 (L) 02/14/2019 0309   HCT 34.6 11/12/2018 0949   PLT 152 02/14/2019 0309   PLT 404 11/12/2018 0949   MCV 85.1 02/14/2019 0309   MCV 78 (L) 11/12/2018 0949   MCH 26.1 02/14/2019 0309   MCHC 30.7 02/14/2019 0309   RDW 15.6 (H) 02/14/2019 0309   RDW 15.0 11/12/2018 0949   LYMPHSABS 2.1 02/14/2019 0309   MONOABS 1.0 02/14/2019 0309   EOSABS 0.3 02/14/2019 0309   BASOSABS 0.0 02/14/2019 0309    BMET    Component Value Date/Time   NA 141 02/15/2019 0319    NA 145 (H) 11/12/2018 0949   K 3.8 02/15/2019 0319   CL 106 02/15/2019 0319   CO2 23 02/15/2019 0319   GLUCOSE 98 02/15/2019 0319   BUN 53 (H) 02/15/2019 0319   BUN 34 (H) 11/12/2018 0949   CREATININE 10.11 (H) 02/15/2019 0319   CREATININE 1.19 (H) 09/28/2015 1524   CALCIUM 8.7 (L) 02/15/2019 0319   CALCIUM 8.8 02/12/2019 0318   GFRNONAA 4 (L) 02/15/2019 0319   GFRNONAA 51 (L) 09/28/2015 1524   GFRAA 4 (L) 02/15/2019 0319   GFRAA 59 (L) 09/28/2015 1524    COAGS: Lab Results  Component Value Date   INR 1.1 02/08/2019   INR 1.1 02/03/2019   INR 1.0 07/08/2018     Non-Invasive Vascular Imaging:     Vein mapping pending.  ASSESSMENT/PLAN: This is a 58 y.o. female with history of stage IV chronic kidney disease now progressing to end-stage renal disease in the setting of hypertensive emergency.  Vascular surgery was asked to place permanent dialysis access.  She currently has a right IJ tunneled catheter.  Vein mapping is pending.  We will tentatively plan for left arm AV fistula versus graft later this week pending vein mapping - likely Wednesday.  Marty Heck, MD [facility] [place] Office: [phone] Pager: [phone]

## 2019-02-15 NOTE — Progress Notes (Signed)
Tele called patient has been running ventricular bigeminy occasional asymptomatic notified on call family medicine.

## 2019-02-16 ENCOUNTER — Inpatient Hospital Stay (HOSPITAL_COMMUNITY): Payer: BC Managed Care – PPO

## 2019-02-16 DIAGNOSIS — I161 Hypertensive emergency: Secondary | ICD-10-CM | POA: Diagnosis not present

## 2019-02-16 DIAGNOSIS — N186 End stage renal disease: Secondary | ICD-10-CM

## 2019-02-16 DIAGNOSIS — N179 Acute kidney failure, unspecified: Secondary | ICD-10-CM | POA: Diagnosis not present

## 2019-02-16 LAB — GLUCOSE, CAPILLARY
Glucose-Capillary: 114 mg/dL — ABNORMAL HIGH (ref 70–99)
Glucose-Capillary: 119 mg/dL — ABNORMAL HIGH (ref 70–99)
Glucose-Capillary: 142 mg/dL — ABNORMAL HIGH (ref 70–99)
Glucose-Capillary: 151 mg/dL — ABNORMAL HIGH (ref 70–99)
Glucose-Capillary: 243 mg/dL — ABNORMAL HIGH (ref 70–99)
Glucose-Capillary: 86 mg/dL (ref 70–99)

## 2019-02-16 LAB — RENAL FUNCTION PANEL
Albumin: 2.5 g/dL — ABNORMAL LOW (ref 3.5–5.0)
Anion gap: 13 (ref 5–15)
BUN: 27 mg/dL — ABNORMAL HIGH (ref 6–20)
CO2: 25 mmol/L (ref 22–32)
Calcium: 8.8 mg/dL — ABNORMAL LOW (ref 8.9–10.3)
Chloride: 100 mmol/L (ref 98–111)
Creatinine, Ser: 6.81 mg/dL — ABNORMAL HIGH (ref 0.44–1.00)
GFR calc Af Amer: 7 mL/min — ABNORMAL LOW (ref >60)
GFR calc non Af Amer: 6 mL/min — ABNORMAL LOW (ref >60)
Glucose, Bld: 95 mg/dL (ref 70–99)
Phosphorus: 4.4 mg/dL (ref 2.5–4.6)
Potassium: 3.8 mmol/L (ref 3.5–5.1)
Sodium: 138 mmol/L (ref 135–145)

## 2019-02-16 MED ORDER — CEFAZOLIN SODIUM-DEXTROSE 2-4 GM/100ML-% IV SOLN
2.0000 g | INTRAVENOUS | Status: AC
  Administered 2019-02-17: 2 g via INTRAVENOUS
  Filled 2019-02-16 (×2): qty 100

## 2019-02-16 NOTE — Progress Notes (Signed)
Physical Therapy Treatment Patient Details Name: [name] MRN: [id number] DOB: [birth year] Today's Date: 02/16/2019    History of Present Illness 58yo female s/p kidney biopsy done 02/03/19 after which she developed nausea and vomiting, found to be in hypertensive emergency in the ED and admitted to ICU. HD cath placed 10/6, first HD session 10/7. PMH DM, HLD, HTN, CKD, obesity    PT Comments    Pt tolerated treatment well. Pt with improved mobility and ambulation quality during session, not requiring physical assistance or assistive device use. Pt remains limited in tolerance for OOB activity and will benefit from continued PT POC to improve endurance and assess stair negotiation. Pt will benefit from discharge home with outpatient PT to improve endurance and power.   Follow Up Recommendations  Outpatient PT     Equipment Recommendations  3in1 (PT)    Recommendations for Other Services       Precautions / Restrictions Precautions Precautions: None Restrictions Weight Bearing Restrictions: No    Mobility  Bed Mobility Overal bed mobility: Modified Independent             General bed mobility comments: use of rail  Transfers Overall transfer level: Independent Equipment used: None Transfers: Sit to/from Stand Sit to Stand: Independent            Ambulation/Gait Ambulation/Gait assistance: Supervision Gait Distance (Feet): 100 Feet Assistive device: None Gait Pattern/deviations: Step-through pattern Gait velocity: decreased Gait velocity interpretation: 1.31 - 2.62 ft/sec, indicative of limited community ambulator General Gait Details: pt with widened BOS and slightly reduced gait speed, otherwise unremarkable   Stairs             Wheelchair Mobility    Modified Rankin (Stroke Patients Only)       Balance Overall balance assessment: Mild deficits observed, not formally tested                                           Cognition Arousal/Alertness: Awake/alert Behavior During Therapy: WFL for tasks assessed/performed Overall Cognitive Status: Within Functional Limits for tasks assessed                                        Exercises      General Comments        Pertinent Vitals/Pain Pain Assessment: No/denies pain    Home Living                      Prior Function            PT Goals (current goals can now be found in the care plan section) Acute Rehab PT Goals Patient Stated Goal: feel beter, go home Progress towards PT goals: Progressing toward goals    Frequency    Min 3X/week      PT Plan Current plan remains appropriate    Co-evaluation              AM-PAC PT "6 Clicks" Mobility   Outcome Measure  Help needed turning from your back to your side while in a flat bed without using bedrails?: None Help needed moving from lying on your back to sitting on the side of a flat bed without using bedrails?: None Help needed moving to and from a bed  to a chair (including a wheelchair)?: None Help needed standing up from a chair using your arms (e.g., wheelchair or bedside chair)?: None Help needed to walk in hospital room?: None Help needed climbing 3-5 steps with a railing? : A Little 6 Click Score: 23    End of Session Equipment Utilized During Treatment: (none) Activity Tolerance: Patient tolerated treatment well Patient left: in bed;with call bell/phone within reach Nurse Communication: Mobility status PT Visit Diagnosis: Unsteadiness on feet (R26.81);Muscle weakness (generalized) (M62.81)     Time: 8412-8208 PT Time Calculation (min) (ACUTE ONLY): 10 min  Charges:  $Gait Training: 8-22 mins                     Zenaida Niece, PT, DPT Acute Rehabilitation Pager: [phone]    Zenaida Niece 02/16/2019, 3:37 PM

## 2019-02-16 NOTE — Progress Notes (Signed)
Patient has been accepted for OP HD treatment at [facility] on a MWF schedule with a seat time of 12:35pm. She needs to arrive to her appointments at 12:15pm.  She needs to report to the clinic at 11:35am on her first day of treatment to complete intake paperwork prior to her first treatment. Renal Navigator notified Nephrologist/Dr. Marval Regal and will meet with patient to provide information verbally and written. Renal Navigator will follow for discharge and inform clinic of start date.   Alphonzo Cruise, [place] Renal Navigator [phone]

## 2019-02-16 NOTE — Progress Notes (Signed)
Patient ID: [name], female   DOB: February 11, 1961, 58 y.o.   MRN: [id number] S: Feels well, no new complaints. O:BP (!) 154/64 (BP Location: Right Arm)   Pulse 73   Temp 98.6 F (37 C) (Oral)   Resp 20   Wt 115.9 kg   SpO2 98%   BMI 37.73 kg/m   Intake/Output Summary (Last 24 hours) at 02/16/2019 1228 Last data filed at 02/16/2019 0125 Gross per 24 hour  Intake 820 ml  Output 1000 ml  Net -180 ml   Intake/Output: I/O last 3 completed shifts: In: 1020 [P.O.:1020] Out: 1500 [Urine:500; Other:1000]  Intake/Output this shift:  No intake/output data recorded. Weight change:  Gen: NAD CVS: no rub Resp: cta Abd: benign Ext: trace edema  Recent Labs  Lab 02/10/19 0428 02/11/19 0225 02/12/19 0318 02/13/19 0150 02/14/19 0309 02/15/19 0319 02/16/19 0253  NA 138 140 142 140 142 141 138  K 3.7 3.8 3.8 3.5 3.7 3.8 3.8  CL 105 104 105 105 104 106 100  CO2 21* 22 20* 24 25 23 25   GLUCOSE 100* 91 100* 115* 78 98 95  BUN 51* 58* 60* 43* 47* 53* 27*  CREATININE 8.26* 9.38* 10.01* 8.07* 9.51* 10.11* 6.81*  ALBUMIN 2.7* 2.5* 2.6* 2.4* 2.5* 2.6* 2.5*  CALCIUM 8.5* 8.8* 8.8*  8.8 8.7* 8.9 8.7* 8.8*  PHOS 5.3* 6.2* 5.9* 5.1* 5.7* 5.7* 4.4   Liver Function Tests: Recent Labs  Lab 02/14/19 0309 02/15/19 0319 02/16/19 0253  ALBUMIN 2.5* 2.6* 2.5*   No results for input(s): LIPASE, AMYLASE in the last 168 hours. No results for input(s): AMMONIA in the last 168 hours. CBC: Recent Labs  Lab 02/11/19 1250 02/12/19 0318 02/13/19 0150 02/14/19 0309 02/15/19 1654  WBC 12.8* 13.6* 10.8* 9.5 12.9*  NEUTROABS 9.5* 10.2* 7.4 6.0  --   HGB 9.4* 9.6* 9.5* 9.1* 9.5*  HCT 28.2* 29.8* 29.6* 29.6* 30.2*  MCV 81.7 82.5 83.1 85.1 84.8  PLT 286 259 198 152 65*   Cardiac Enzymes: No results for input(s): CKTOTAL, CKMB, CKMBINDEX, TROPONINI in the last 168 hours. CBG: Recent Labs  Lab 02/15/19 2018 02/16/19 0019 02/16/19 0421 02/16/19 0807 02/16/19 1213  GLUCAP 145* 142* 86 114*  119*    Iron Studies: No results for input(s): IRON, TIBC, TRANSFERRIN, FERRITIN in the last 72 hours. Studies/Results: No results found. Marland Kitchen amLODipine  10 mg Oral Daily  . carvedilol  25 mg Oral BID WC  . Chlorhexidine Gluconate Cloth  6 each Topical Daily  . cloNIDine  0.2 mg Oral TID  . heparin  5,000 Units Subcutaneous Q8H  . insulin aspart  0-15 Units Subcutaneous Q4H  . polyethylene glycol  17 g Oral BID  . senna  1 tablet Oral Daily    BMET    Component Value Date/Time   NA 138 02/16/2019 0253   NA 145 (H) 11/12/2018 0949   K 3.8 02/16/2019 0253   CL 100 02/16/2019 0253   CO2 25 02/16/2019 0253   GLUCOSE 95 02/16/2019 0253   BUN 27 (H) 02/16/2019 0253   BUN 34 (H) 11/12/2018 0949   CREATININE 6.81 (H) 02/16/2019 0253   CREATININE 1.19 (H) 09/28/2015 1524   CALCIUM 8.8 (L) 02/16/2019 0253   CALCIUM 8.8 02/12/2019 0318   GFRNONAA 6 (L) 02/16/2019 0253   GFRNONAA 51 (L) 09/28/2015 1524   GFRAA 7 (L) 02/16/2019 0253   GFRAA 59 (L) 09/28/2015 1524   CBC    Component Value Date/Time  WBC 12.9 (H) 02/15/2019 1654   RBC 3.56 (L) 02/15/2019 1654   HGB 9.5 (L) 02/15/2019 1654   HGB 11.4 11/12/2018 0949   HCT 30.2 (L) 02/15/2019 1654   HCT 34.6 11/12/2018 0949   PLT 65 (L) 02/15/2019 1654   PLT 404 11/12/2018 0949   MCV 84.8 02/15/2019 1654   MCV 78 (L) 11/12/2018 0949   MCH 26.7 02/15/2019 1654   MCHC 31.5 02/15/2019 1654   RDW 15.6 (H) 02/15/2019 1654   RDW 15.0 11/12/2018 0949   LYMPHSABS 2.1 02/14/2019 0309   MONOABS 1.0 02/14/2019 0309   EOSABS 0.3 02/14/2019 0309   BASOSABS 0.0 02/14/2019 0309     Assessment/Plan:  1. AKI/CKD stage 4 now ESRD- due to hypertensive emergency.  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  Started on HD 02/10/19.  BUN/Cr cont to climb between treatments.   1. CLIP placement pending for outpatient HD 2. For AVF/AVG placement 02/17/19  3. Will  hold HD tomorrow due to AVF/AVG placement and plan for HD on Thursday (she will  Likely be on TTS schedule) 2. Hypertensive emergency- improved with meds and HD. 3. DM- off of metformin, management per primary svc 4. Anemia of CKD- on ESA and s/p feraheme 5. Secondary HPTH- stable without meds for now.  6. Disposition- will need outpatient HD to be arranged and AVF/AVG placement prior to discharge.   Donetta Potts, MD [employer] [phone]

## 2019-02-16 NOTE — Progress Notes (Signed)
Plan for left arm AV fistula versus graft tomorrow.  Vein mapping is pending.  Please have n.p.o. after midnight.  Consent order placed in the chart.  Please restrict left upper extremity.  Marty Heck, MD [facility] [place] Office: [phone] Pager: [phone]

## 2019-02-17 ENCOUNTER — Encounter (HOSPITAL_COMMUNITY): Payer: Self-pay

## 2019-02-17 ENCOUNTER — Other Ambulatory Visit: Payer: Self-pay

## 2019-02-17 ENCOUNTER — Inpatient Hospital Stay (HOSPITAL_COMMUNITY): Payer: BC Managed Care – PPO | Admitting: Certified Registered"

## 2019-02-17 ENCOUNTER — Encounter (HOSPITAL_COMMUNITY)
Admission: EM | Disposition: A | Discharge status: Home / Self Care | Payer: Self-pay | Source: Home / Self Care | Attending: Family Medicine

## 2019-02-17 DIAGNOSIS — I161 Hypertensive emergency: Secondary | ICD-10-CM | POA: Diagnosis not present

## 2019-02-17 DIAGNOSIS — N185 Chronic kidney disease, stage 5: Secondary | ICD-10-CM

## 2019-02-17 DIAGNOSIS — N179 Acute kidney failure, unspecified: Secondary | ICD-10-CM | POA: Diagnosis not present

## 2019-02-17 HISTORY — PX: AV FISTULA PLACEMENT: SHX1204

## 2019-02-17 LAB — RENAL FUNCTION PANEL
Albumin: 2.7 g/dL — ABNORMAL LOW (ref 3.5–5.0)
Anion gap: 12 (ref 5–15)
BUN: 35 mg/dL — ABNORMAL HIGH (ref 6–20)
CO2: 23 mmol/L (ref 22–32)
Calcium: 9 mg/dL (ref 8.9–10.3)
Chloride: 104 mmol/L (ref 98–111)
Creatinine, Ser: 7.6 mg/dL — ABNORMAL HIGH (ref 0.44–1.00)
GFR calc Af Amer: 6 mL/min — ABNORMAL LOW (ref >60)
GFR calc non Af Amer: 5 mL/min — ABNORMAL LOW (ref >60)
Glucose, Bld: 91 mg/dL (ref 70–99)
Phosphorus: 5.3 mg/dL — ABNORMAL HIGH (ref 2.5–4.6)
Potassium: 3.9 mmol/L (ref 3.5–5.1)
Sodium: 139 mmol/L (ref 135–145)

## 2019-02-17 LAB — GLUCOSE, CAPILLARY
Glucose-Capillary: 105 mg/dL — ABNORMAL HIGH (ref 70–99)
Glucose-Capillary: 118 mg/dL — ABNORMAL HIGH (ref 70–99)
Glucose-Capillary: 168 mg/dL — ABNORMAL HIGH (ref 70–99)
Glucose-Capillary: 78 mg/dL (ref 70–99)
Glucose-Capillary: 78 mg/dL (ref 70–99)
Glucose-Capillary: 88 mg/dL (ref 70–99)
Glucose-Capillary: 91 mg/dL (ref 70–99)

## 2019-02-17 LAB — CBC
HCT: 28 % — ABNORMAL LOW (ref 36.0–46.0)
Hemoglobin: 9 g/dL — ABNORMAL LOW (ref 12.0–15.0)
MCH: 27.1 pg (ref 26.0–34.0)
MCHC: 32.1 g/dL (ref 30.0–36.0)
MCV: 84.3 fL (ref 80.0–100.0)
Platelets: 83 10*3/uL — ABNORMAL LOW (ref 150–400)
RBC: 3.32 MIL/uL — ABNORMAL LOW (ref 3.87–5.11)
RDW: 15.3 % (ref 11.5–15.5)
WBC: 10.5 10*3/uL (ref 4.0–10.5)
nRBC: 0 % (ref 0.0–0.2)

## 2019-02-17 SURGERY — ARTERIOVENOUS (AV) FISTULA CREATION
Anesthesia: General | Site: Arm Upper | Laterality: Left

## 2019-02-17 MED ORDER — FENTANYL CITRATE (PF) 100 MCG/2ML IJ SOLN
INTRAMUSCULAR | Status: AC
  Filled 2019-02-17: qty 2

## 2019-02-17 MED ORDER — GLYCOPYRROLATE PF 0.2 MG/ML IJ SOSY
PREFILLED_SYRINGE | INTRAMUSCULAR | Status: DC | PRN
  Administered 2019-02-17: .1 mg via INTRAVENOUS

## 2019-02-17 MED ORDER — SODIUM CHLORIDE 0.9 % IV SOLN
INTRAVENOUS | Status: AC
  Filled 2019-02-17: qty 1.2

## 2019-02-17 MED ORDER — FENTANYL CITRATE (PF) 100 MCG/2ML IJ SOLN
INTRAMUSCULAR | Status: DC | PRN
  Administered 2019-02-17 (×4): 25 ug via INTRAVENOUS

## 2019-02-17 MED ORDER — FENTANYL CITRATE (PF) 100 MCG/2ML IJ SOLN
25.0000 ug | INTRAMUSCULAR | Status: DC | PRN
  Administered 2019-02-17: 50 ug via INTRAVENOUS

## 2019-02-17 MED ORDER — DEXAMETHASONE SODIUM PHOSPHATE 10 MG/ML IJ SOLN
INTRAMUSCULAR | Status: AC
  Filled 2019-02-17: qty 1

## 2019-02-17 MED ORDER — OXYCODONE HCL 5 MG PO TABS: 5.0000 mg | ORAL_TABLET | Freq: Once | ORAL | Status: DC | PRN

## 2019-02-17 MED ORDER — ONDANSETRON HCL 4 MG/2ML IJ SOLN: 4.0000 mg | Freq: Once | INTRAMUSCULAR | Status: DC | PRN

## 2019-02-17 MED ORDER — LIDOCAINE-EPINEPHRINE (PF) 1 %-1:200000 IJ SOLN
INTRAMUSCULAR | Status: AC
  Filled 2019-02-17: qty 30

## 2019-02-17 MED ORDER — TRAMADOL HCL 50 MG PO TABS: 50.0000 mg | ORAL_TABLET | Freq: Four times a day (QID) | ORAL | 0 refills | Status: DC | PRN

## 2019-02-17 MED ORDER — OXYCODONE HCL 5 MG/5ML PO SOLN: 5.0000 mg | Freq: Once | ORAL | Status: DC | PRN

## 2019-02-17 MED ORDER — MIDAZOLAM HCL 5 MG/5ML IJ SOLN
INTRAMUSCULAR | Status: DC | PRN
  Administered 2019-02-17: 1 mg via INTRAVENOUS

## 2019-02-17 MED ORDER — ONDANSETRON HCL 4 MG/2ML IJ SOLN
INTRAMUSCULAR | Status: AC
  Filled 2019-02-17: qty 2

## 2019-02-17 MED ORDER — LIDOCAINE 2% (20 MG/ML) 5 ML SYRINGE
INTRAMUSCULAR | Status: AC
  Filled 2019-02-17: qty 5

## 2019-02-17 MED ORDER — MIDAZOLAM HCL 2 MG/2ML IJ SOLN
INTRAMUSCULAR | Status: AC
  Filled 2019-02-17: qty 2

## 2019-02-17 MED ORDER — OXYCODONE HCL 5 MG PO TABS
5.0000 mg | ORAL_TABLET | Freq: Once | ORAL | Status: AC
  Administered 2019-02-17: 5 mg via ORAL
  Filled 2019-02-17: qty 1

## 2019-02-17 MED ORDER — HEPARIN SODIUM (PORCINE) 1000 UNIT/ML IJ SOLN
INTRAMUSCULAR | Status: AC
  Filled 2019-02-17: qty 4

## 2019-02-17 MED ORDER — DEXAMETHASONE SODIUM PHOSPHATE 4 MG/ML IJ SOLN
INTRAMUSCULAR | Status: DC | PRN
  Administered 2019-02-17: 4 mg via INTRAVENOUS

## 2019-02-17 MED ORDER — SODIUM CHLORIDE 0.9 % IV SOLN
INTRAVENOUS | Status: DC | PRN
  Administered 2019-02-17: 500 mL

## 2019-02-17 MED ORDER — 0.9 % SODIUM CHLORIDE (POUR BTL) OPTIME
TOPICAL | Status: DC | PRN
  Administered 2019-02-17: 11:00:00 1000 mL

## 2019-02-17 MED ORDER — PROPOFOL 10 MG/ML IV BOLUS
INTRAVENOUS | Status: AC
  Filled 2019-02-17: qty 20

## 2019-02-17 MED ORDER — SODIUM CHLORIDE 0.9 % IV SOLN: INTRAVENOUS | Status: DC

## 2019-02-17 MED ORDER — LIDOCAINE 2% (20 MG/ML) 5 ML SYRINGE
INTRAMUSCULAR | Status: DC | PRN
  Administered 2019-02-17: 60 mg via INTRAVENOUS

## 2019-02-17 MED ORDER — ONDANSETRON HCL 4 MG/2ML IJ SOLN
INTRAMUSCULAR | Status: DC | PRN
  Administered 2019-02-17: 4 mg via INTRAVENOUS

## 2019-02-17 MED ORDER — GLYCOPYRROLATE PF 0.2 MG/ML IJ SOSY
PREFILLED_SYRINGE | INTRAMUSCULAR | Status: AC
  Filled 2019-02-17: qty 1

## 2019-02-17 MED ORDER — PROPOFOL 10 MG/ML IV BOLUS
INTRAVENOUS | Status: DC | PRN
  Administered 2019-02-17: 120 mg via INTRAVENOUS

## 2019-02-17 MED ORDER — FENTANYL CITRATE (PF) 250 MCG/5ML IJ SOLN
INTRAMUSCULAR | Status: AC
  Filled 2019-02-17: qty 5

## 2019-02-17 SURGICAL SUPPLY — 34 items
ADH SKN CLS APL DERMABOND .7 (GAUZE/BANDAGES/DRESSINGS) ×1
ARMBAND PINK RESTRICT EXTREMIT (MISCELLANEOUS) ×3 IMPLANT
CANISTER SUCT 3000ML PPV (MISCELLANEOUS) ×3 IMPLANT
CLIP VESOCCLUDE MED 6/CT (CLIP) ×3 IMPLANT
CLIP VESOCCLUDE SM WIDE 6/CT (CLIP) ×3 IMPLANT
COVER PROBE W GEL 5X96 (DRAPES) IMPLANT
COVER WAND RF STERILE (DRAPES) ×1 IMPLANT
DERMABOND ADVANCED (GAUZE/BANDAGES/DRESSINGS) ×2
DERMABOND ADVANCED .7 DNX12 (GAUZE/BANDAGES/DRESSINGS) ×1 IMPLANT
ELECT REM PT RETURN 9FT ADLT (ELECTROSURGICAL) ×3
ELECTRODE REM PT RTRN 9FT ADLT (ELECTROSURGICAL) ×1 IMPLANT
GLOVE BIO SURGEON STRL SZ7.5 (GLOVE) ×3 IMPLANT
GLOVE BIOGEL PI IND STRL 6.5 (GLOVE) IMPLANT
GLOVE BIOGEL PI IND STRL 7.5 (GLOVE) IMPLANT
GLOVE BIOGEL PI INDICATOR 6.5 (GLOVE) ×8
GLOVE BIOGEL PI INDICATOR 7.5 (GLOVE) ×2
GLOVE ECLIPSE 7.5 STRL STRAW (GLOVE) ×2 IMPLANT
GOWN STRL REUS W/ TWL LRG LVL3 (GOWN DISPOSABLE) ×2 IMPLANT
GOWN STRL REUS W/ TWL XL LVL3 (GOWN DISPOSABLE) ×1 IMPLANT
GOWN STRL REUS W/TWL LRG LVL3 (GOWN DISPOSABLE) ×9
GOWN STRL REUS W/TWL XL LVL3 (GOWN DISPOSABLE) ×3
INSERT FOGARTY SM (MISCELLANEOUS) IMPLANT
KIT BASIN OR (CUSTOM PROCEDURE TRAY) ×3 IMPLANT
KIT TURNOVER KIT B (KITS) ×3 IMPLANT
NS IRRIG 1000ML POUR BTL (IV SOLUTION) ×3 IMPLANT
PACK CV ACCESS (CUSTOM PROCEDURE TRAY) ×3 IMPLANT
PAD ARMBOARD 7.5X6 YLW CONV (MISCELLANEOUS) ×6 IMPLANT
SUT MNCRL AB 4-0 PS2 18 (SUTURE) ×3 IMPLANT
SUT PROLENE 6 0 BV (SUTURE) ×5 IMPLANT
SUT VIC AB 3-0 SH 27 (SUTURE) ×3
SUT VIC AB 3-0 SH 27X BRD (SUTURE) ×1 IMPLANT
TOWEL GREEN STERILE (TOWEL DISPOSABLE) ×3 IMPLANT
UNDERPAD 30X30 (UNDERPADS AND DIAPERS) ×3 IMPLANT
WATER STERILE IRR 1000ML POUR (IV SOLUTION) ×3 IMPLANT

## 2019-02-17 NOTE — Transfer of Care (Signed)
Immediate Anesthesia Transfer of Care Note  Patient: [name]  Procedure(s) Performed: ARTERIOVENOUS (AV) FISTULA CREATION LEFT UPPER ARM (Left Arm Upper)  Patient Location: PACU  Anesthesia Type:General  Level of Consciousness: awake and patient cooperative  Airway & Oxygen Therapy: Patient Spontanous Breathing and Patient connected to nasal cannula oxygen  Post-op Assessment: Report given to RN and Post -op Vital signs reviewed and stable  Post vital signs: Reviewed and stable  Last Vitals:  Vitals Value Taken Time  BP    Temp    Pulse 41 02/17/19 1148  Resp 17 02/17/19 1148  SpO2 87 % 02/17/19 1148  Vitals shown include unvalidated device data.  Last Pain:  Vitals:   02/17/19 1019  TempSrc:   PainSc: 0-No pain      Patients Stated Pain Goal: 0 (41/74/08 1448)  Complications: No apparent anesthesia complications

## 2019-02-17 NOTE — Op Note (Signed)
    Patient name: [name] MRN: [id number] DOB: 12-05-1960 Sex: female  02/17/2019 Pre-operative Diagnosis: End-stage renal disease Post-operative diagnosis:  Same Surgeon:  Erlene Quan C. Donzetta Matters, MD Assistant: Laurence Slate, PA Procedure Performed  Left arm brachiocephalic AV fistula creation  Indications: 58 year old female with end-stage renal disease currently dialyzing via catheter.  She is indicated for permanent access.  Findings: Cephalic vein was large there had been multiple IVs placed into it 1 area needed to be repaired primarily.  We used a side branch to touch the brachial artery.  Completion was a strong thrill and signal at the radial artery at the wrist augmented with compression of the fistula.   Procedure:  The patient was identified in the holding area and taken to the operating room where she is placed upon operative MAC anesthesia induced.  She was sterilely prepped draped in left upper extremity fashion antibiotics were administered timeout was called.  We used ultrasound to identify the cephalic vein this is also identifiable in the skin where there were multiple IVs.  Transverse incision was made between that and the palpable brachial pulse below the antecubitum.  We dissected out the vein.  There was 1 area of injury that was repaired primarily with 6-0 Prolene suture.  We marked the vein for orientation.  There was a large side branch coming off towards the deep venous system that we exposed to used for creation of her fistula.  We identified the brachial artery dissected [place] vessel loop around it.  We transected the cephalic vein tied off distally.  We flushed with heparinized saline and spatulated.  It was then clamped.  We clamped the brachial artery distally proximally opened longitudinally.  We flushed with heparinized saline both directions.  The vein was then sewn inside with 6-0 Prolene suture.  Prior to completion anastomosis we allowed flushing all directions.   Upon completion there was a thrill in the fistula and radial artery signal at the wrist was monophasic did improve with compression of the fistula.  Satisfied with this we irrigated the wounds obtained stasis.  We closed in layers of Vicryl Monocryl dermal was placed to level skin.  She was awakened anesthesia having tolerated procedure without any complication.  Counts were correct at completion.  EBL: 10cc   Larken Urias C. Donzetta Matters, MD [facility] Office: [phone] Pager: [phone]

## 2019-02-17 NOTE — Anesthesia Postprocedure Evaluation (Signed)
Anesthesia Post Note  Patient: [name]  Procedure(s) Performed: ARTERIOVENOUS (AV) FISTULA CREATION LEFT UPPER ARM (Left Arm Upper)     Anesthesia Post Evaluation  Last Vitals:  Vitals:   02/17/19 1233 02/17/19 1315  BP: (!) 150/71 (!) 164/76  Pulse: 72 71  Resp: 20 17  Temp:  36.9 C  SpO2: 91% 98%    Last Pain:  Vitals:   02/17/19 1315  TempSrc: Oral  PainSc: [place]

## 2019-02-17 NOTE — Progress Notes (Signed)
  Progress Note    02/17/2019 10:18 AM Day of Surgery  Subjective:  No overnight issues  Vitals:   02/16/19 2100 02/17/19 0423  BP: (!) 150/61 (!) 145/83  Pulse: 74 68  Resp: 17 16  Temp: 98.4 F (36.9 C) 98.1 F (36.7 C)  SpO2: 97% 100%    Physical Exam: aaox3 Non labored respirations Palpable left radial pulse  CBC    Component Value Date/Time   WBC 10.5 02/17/2019 0231   RBC 3.32 (L) 02/17/2019 0231   HGB 9.0 (L) 02/17/2019 0231   HGB 11.4 11/12/2018 0949   HCT 28.0 (L) 02/17/2019 0231   HCT 34.6 11/12/2018 0949   PLT 83 (L) 02/17/2019 0231   PLT 404 11/12/2018 0949   MCV 84.3 02/17/2019 0231   MCV 78 (L) 11/12/2018 0949   MCH 27.1 02/17/2019 0231   MCHC 32.1 02/17/2019 0231   RDW 15.3 02/17/2019 0231   RDW 15.0 11/12/2018 0949   LYMPHSABS 2.1 02/14/2019 0309   MONOABS 1.0 02/14/2019 0309   EOSABS 0.3 02/14/2019 0309   BASOSABS 0.0 02/14/2019 0309    BMET    Component Value Date/Time   NA 139 02/17/2019 0231   NA 145 (H) 11/12/2018 0949   K 3.9 02/17/2019 0231   CL 104 02/17/2019 0231   CO2 23 02/17/2019 0231   GLUCOSE 91 02/17/2019 0231   BUN 35 (H) 02/17/2019 0231   BUN 34 (H) 11/12/2018 0949   CREATININE 7.60 (H) 02/17/2019 0231   CREATININE 1.19 (H) 09/28/2015 1524   CALCIUM 9.0 02/17/2019 0231   CALCIUM 8.8 02/12/2019 0318   GFRNONAA 5 (L) 02/17/2019 0231   GFRNONAA 51 (L) 09/28/2015 1524   GFRAA 6 (L) 02/17/2019 0231   GFRAA 59 (L) 09/28/2015 1524    INR    Component Value Date/Time   INR 1.1 02/08/2019 0822     Intake/Output Summary (Last 24 hours) at 02/17/2019 1018 Last data filed at 02/16/2019 1630 Gross per 24 hour  Intake 0 ml  Output -  Net 0 ml     Assessment:  58 y.o. female is in need of permanent dialysis access.  Plan: OR today for left arm avf vs avg   [name] C. Donzetta Matters, MD [facility] Office: [phone] Pager: [phone]  02/17/2019 10:18 AM

## 2019-02-17 NOTE — Anesthesia Preprocedure Evaluation (Signed)
Anesthesia Evaluation  Patient identified by MRN, date of birth, ID band Patient awake    Reviewed: Allergy & Precautions, NPO status , Patient's Chart, lab work & pertinent test results  History of Anesthesia Complications Negative for: history of anesthetic complications  Airway Mallampati: II  TM Distance: >3 FB Neck ROM: Full    Dental  (+) Edentulous Upper   Pulmonary sleep apnea , former smoker,    Pulmonary exam normal        Cardiovascular hypertension, Normal cardiovascular exam     Neuro/Psych negative neurological ROS  negative psych ROS   GI/Hepatic Neg liver ROS, GERD  ,  Endo/Other  diabetes, Type 2  Renal/GU Renal disease  negative genitourinary   Musculoskeletal  (+) Arthritis , Osteoarthritis,    Abdominal   Peds  Hematology  (+) anemia ,   Anesthesia Other Findings Pulmonary hypertension  Reproductive/Obstetrics                           Anesthesia Physical Anesthesia Plan  ASA: IV  Anesthesia Plan: General   Post-op Pain Management:    Induction: Intravenous  PONV Risk Score and Plan: 3 and Ondansetron, Dexamethasone, Treatment may vary due to age or medical condition and Midazolam  Airway Management Planned: LMA  Additional Equipment: None  Intra-op Plan:   Post-operative Plan: Extubation in OR  Informed Consent: I have reviewed the patients History and Physical, chart, labs and discussed the procedure including the risks, benefits and alternatives for the proposed anesthesia with the patient or authorized representative who has indicated his/her understanding and acceptance.     Dental advisory given  Plan Discussed with:   Anesthesia Plan Comments:         Anesthesia Quick Evaluation

## 2019-02-17 NOTE — Discharge Instructions (Signed)
° °  [facility] ° °Discharge Instructions ° °AV Fistula or Graft Surgery for Dialysis Access ° °Please refer to the following instructions for your post-procedure care. Your surgeon or physician assistant will discuss any changes with you. ° °Activity ° °You may drive the day following your surgery, if you are comfortable and no longer taking prescription pain medication. Resume full activity as the soreness in your incision resolves. ° °Bathing/Showering ° °You may shower after you go home. Keep your incision dry for 48 hours. Do not soak in a bathtub, hot tub, or swim until the incision heals completely. You may not shower if you have a hemodialysis catheter. ° °Incision Care ° °Clean your incision with mild soap and water after 48 hours. Pat the area dry with a clean towel. You do not need a bandage unless otherwise instructed. Do not apply any ointments or creams to your incision. You may have skin glue on your incision. Do not peel it off. It will come off on its own in about one week. Your arm may swell a bit after surgery. To reduce swelling use pillows to elevate your arm so it is above your heart. Your doctor will tell you if you need to lightly wrap your arm with an ACE bandage. ° °Diet ° °Resume your normal diet. There are not special food restrictions following this procedure. In order to heal from your surgery, it is CRITICAL to get adequate nutrition. Your body requires vitamins, minerals, and protein. Vegetables are the best source of vitamins and minerals. Vegetables also provide the perfect balance of protein. Processed food has little nutritional value, so try to avoid this. ° °Medications ° °Resume taking all of your medications. If your incision is causing pain, you may take over-the counter pain relievers such as acetaminophen (Tylenol). If you were prescribed a stronger pain medication, please be aware these medications can cause nausea and constipation. Prevent  nausea by taking the medication with a snack or meal. Avoid constipation by drinking plenty of fluids and eating foods with high amount of fiber, such as fruits, vegetables, and grains. Do not take Tylenol if you are taking prescription pain medications. ° ° ° ° °Follow up °Your surgeon may want to see you in the office following your access surgery. If so, this will be arranged at the time of your surgery. ° °Please call us immediately for any of the following conditions: ° °Increased pain, redness, drainage (pus) from your incision site °Fever of 101 degrees or higher °Severe or worsening pain at your incision site °Hand pain or numbness. ° °Reduce your risk of vascular disease: ° °Stop smoking. If you would like help, call [facility] at 1-800-QUIT-NOW (1-800-784-8669) or [place] at 336-586-4000 ° °Manage your cholesterol °Maintain a desired weight °Control your diabetes °Keep your blood pressure down ° °Dialysis ° °It will take several weeks to several months for your new dialysis access to be ready for use. Your surgeon will determine when it is OK to use it. Your nephrologist will continue to direct your dialysis. You can continue to use your Permcath until your new access is ready for use. ° °If you have any questions, please call the office at [phone]. ° °

## 2019-02-17 NOTE — Anesthesia Procedure Notes (Signed)
Procedure Name: LMA Insertion Date/Time: 02/17/2019 10:52 AM Performed by: Orlie Dakin, CRNA Pre-anesthesia Checklist: Patient identified, Emergency Drugs available, Suction available and Patient being monitored Patient Re-evaluated:Patient Re-evaluated prior to induction Oxygen Delivery Method: Circle system utilized Preoxygenation: Pre-oxygenation with 100% oxygen Induction Type: IV induction LMA: LMA inserted LMA Size: 4.0 Tube type: Oral Number of attempts: 1 Placement Confirmation: positive ETCO2 Tube secured with: Tape Dental Injury: Teeth and Oropharynx as per pre-operative assessment

## 2019-02-17 NOTE — Progress Notes (Signed)
PT Cancellation Note  Patient Details Name: [name] MRN: [id number] DOB: 1961/02/05   Cancelled Treatment:    Reason Eval/Treat Not Completed: Patient at procedure or test/unavailable. Will re-attempt later today if time allows.    [name] 02/17/2019, 10:59 AM

## 2019-02-17 NOTE — Progress Notes (Signed)
Renal Navigator met with patient at HD bedside to discuss OP HD clinic and schedule. Renal Navigator provided information in writing. Patient stated understanding that she will start in the clinic on Friday, 02/19/19 at [facility] and that she needs to arrive at 11:35am to complete intake paperwork prior to her first treatment. Patient states she is hopeful for discharge tomorrow and feels she would be rushed with a discharge after surgery and HD today. Renal Navigator discussed with attending. Patient still on HD at this time.   Alphonzo Cruise, [place] Renal Navigator [phone]

## 2019-02-17 NOTE — Procedures (Signed)
I was present at this dialysis session. I have reviewed the session itself and made appropriate changes.  Still sore from surgery and doesn't feel well.  Vital signs in last 24 hours:  Temp:  [97.5 F (36.4 C)-98.4 F (36.9 C)] 97.5 F (36.4 C) (10/14 1400) Pulse Rate:  [41-76] 72 (10/14 1500) Resp:  [15-24] 19 (10/14 1400) BP: (145-190)/(59-88) 172/88 (10/14 1500) SpO2:  [87 %-100 %] 95 % (10/14 1400) Weight:  [115.9 kg-116.9 kg] 116.9 kg (10/14 1400) Weight change:  Filed Weights   02/16/19 0500 02/17/19 1019 02/17/19 1400  Weight: 115.9 kg 115.9 kg 116.9 kg    Recent Labs  Lab 02/17/19 0231  NA 139  K 3.9  CL 104  CO2 23  GLUCOSE 91  BUN 35*  CREATININE 7.60*  CALCIUM 9.0  PHOS 5.3*    Recent Labs  Lab 02/12/19 0318 02/13/19 0150 02/14/19 0309 02/15/19 1654 02/17/19 0231  WBC 13.6* 10.8* 9.5 12.9* 10.5  NEUTROABS 10.2* 7.4 6.0  --   --   HGB 9.6* 9.5* 9.1* 9.5* 9.0*  HCT 29.8* 29.6* 29.6* 30.2* 28.0*  MCV 82.5 83.1 85.1 84.8 84.3  PLT 259 198 152 65* 83*    Scheduled Meds: . amLODipine  10 mg Oral Daily  . carvedilol  25 mg Oral BID WC  . Chlorhexidine Gluconate Cloth  6 each Topical Daily  . cloNIDine  0.2 mg Oral TID  . fentaNYL      . heparin  5,000 Units Subcutaneous Q8H  . insulin aspart  0-15 Units Subcutaneous Q4H  . polyethylene glycol  17 g Oral BID  . senna  1 tablet Oral Daily   Continuous Infusions: . sodium chloride 0 mL/hr at 02/08/19 1436   PRN Meds:.sodium chloride, acetaminophen, gabapentin, ondansetron (ZOFRAN) IV, white petrolatum    Assessment/Plan:  1. AKI/CKD stage 4now ESRD- due to hypertensive emergency. 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 Started on HD 02/10/19. BUN/Cr cont to climb between treatments.  1. CLIP placement pending for outpatient HD 2. S/p LUE AVF creation 02/17/19  3. Continue with MWF 2. Hypertensive emergency-  improved with meds and HD. 3. DM- off of metformin, management per primary svc 4. Anemia of CKD- on ESA and s/p feraheme 5. Secondary HPTH- stable without meds for now. 6. Disposition- has outpatient HD at [facility] MWF and can be discharged tomorrow and f/u at the unit on 02/19/19.  Donetta Potts,  MD 02/17/2019, 3:32 PM

## 2019-02-17 NOTE — Progress Notes (Signed)
Family Medicine Teaching Service Daily Progress Note Intern Pager: [phone]  Patient name: [name] Medical record number: [id number] Date of birth: 1961-03-06 Age: 58 y.o. Gender: female  Primary Care Provider: Matilde Haymaker, MD Consultants: CCM, Nephrology, IR  Code Status: Full   Pt Overview and Major Events to Date:  10/02- Admitted to ICU for Hypertensive emergency 10/06- HD cath placement 10/07-HD started   Assessment and Plan: 58yo F with h/o T2DM, GERD, HLD, HTN, CKD presented with persistent emesis, chest pain, and headache found to be in hypertensive emergency in ED in setting of missed doses of BP meds.  Hypertensive emergency-improving BP stable overnight.  145- 150/61- 83.  Required no as needed meds overnight. -Nephro following -Continue current meds amlodipine, carvedilol, clonidine -Hold BP meds on dialysis days -BMP daily  Kidney injury secondary to HTN emergency requiring dialysis Stable creatinine 7.60, 6.81 yesterday.  No documented urine output overnight.   -Nephro following, for HD AV fistula today -Avoid nephrotoxic agents   Leukocytosis stable Patient afebrile.  WBC within normal limits -CBC every 2 days -Monitor for signs and symptoms of infection  Perinephric hemorrhage -monitor for signs of reoccurance  Type 2 Diabetes-stable  CBGs overnight 78-243.  Required 5 units insulin coverage overnight -Metformin on hold in the setting of end-stage renal disease -Continue moderate sliding scale insulin coverage  Anemia of CKD-stable Asymptomatic.  Hemoglobin 9.0 today.  -Per nephro patient currently on ESA and Feraheme -Monitor CBC every 2 days  Pruritis Resolved -Continue to monitor -Continue gabapentin nightly as needed -Nephro following  FEN/GI: renal diet PPx: heparin  Disposition: continued inpatient stay for HD and pending nephrology recommendations   Subjective:  No acute events overnight.  Denies any chest pain, shortness of  breath, nausea vomiting, abdominal pain.  Good appetite.  Voiding well.  Last BM 10/13.  Patient voices that she is a little anxious about her procedure today.  Spoke briefly with patient to help with anxiety.  Objective: Temp:  [98.1 F (36.7 C)-98.9 F (37.2 C)] 98.1 F (36.7 C) (10/14 0423) Pulse Rate:  [68-74] 68 (10/14 0423) Resp:  [16-17] 16 (10/14 0423) BP: (145-151)/(61-83) 145/83 (10/14 0423) SpO2:  [97 %-100 %] 100 % (10/14 0423)   Physical Exam:  General: 58 year old female, in no acute distress  Neck: Supple, no increased JVD appreciated Cardiovascular: Rate and rhythm, no murmurs appreciated Respiratory: Chest clear to auscultation bilaterally, no rhonchi, no crackles noted Gastrointestinal: Soft, nontender, nondistended, bowel sounds present. MSK: Moving all extremities, trace lower extremity edema  Derm: HD cath site dressing intact, no obvious signs of infection. Neuro: Alert and orientated x4 Psych: Pleasant, cooperative answers questions appropriately.     Laboratory: Recent Labs  Lab 02/14/19 0309 02/15/19 1654 02/17/19 0231  WBC 9.5 12.9* 10.5  HGB 9.1* 9.5* 9.0*  HCT 29.6* 30.2* 28.0*  PLT 152 65* 83*   Recent Labs  Lab 02/15/19 0319 02/16/19 0253 02/17/19 0231  NA 141 138 139  K 3.8 3.8 3.9  CL 106 100 104  CO2 23 25 23   BUN 53* 27* 35*  CREATININE 10.11* 6.81* 7.60*  CALCIUM 8.7* 8.8* 9.0  GLUCOSE 98 95 91    Imaging/Diagnostic Tests: No new imaging   Carollee Leitz, MD 02/17/2019, 9:19 AM PGY-1, [place] Intern pager: [phone], text pages welcome

## 2019-02-18 ENCOUNTER — Encounter (HOSPITAL_COMMUNITY): Payer: Self-pay | Admitting: Vascular Surgery

## 2019-02-18 LAB — GLUCOSE, CAPILLARY
Glucose-Capillary: 116 mg/dL — ABNORMAL HIGH (ref 70–99)
Glucose-Capillary: 115 mg/dL — ABNORMAL HIGH (ref 70–99)
Glucose-Capillary: 109 mg/dL — ABNORMAL HIGH (ref 70–99)
Glucose-Capillary: 204 mg/dL — ABNORMAL HIGH (ref 70–99)

## 2019-02-18 LAB — RENAL FUNCTION PANEL
Albumin: 2.6 g/dL — ABNORMAL LOW (ref 3.5–5.0)
Anion gap: 11 (ref 5–15)
BUN: 15 mg/dL (ref 6–20)
CO2: 27 mmol/L (ref 22–32)
Calcium: 8.5 mg/dL — ABNORMAL LOW (ref 8.9–10.3)
Chloride: 99 mmol/L (ref 98–111)
Creatinine, Ser: 4.67 mg/dL — ABNORMAL HIGH (ref 0.44–1.00)
GFR calc Af Amer: 11 mL/min — ABNORMAL LOW (ref >60)
GFR calc non Af Amer: 9 mL/min — ABNORMAL LOW (ref >60)
Glucose, Bld: 159 mg/dL — ABNORMAL HIGH (ref 70–99)
Phosphorus: 4.4 mg/dL (ref 2.5–4.6)
Potassium: 4.4 mmol/L (ref 3.5–5.1)
Sodium: 137 mmol/L (ref 135–145)

## 2019-02-18 NOTE — Progress Notes (Signed)
PT Cancellation Note  Patient Details Name: [name] MRN: [id number] DOB: Sep 09, 1960   Cancelled Treatment:    Reason Eval/Treat Not Completed: Patient declined, no reason specified. PT attempted to see pt for treatment however pt declining, reporting that she is preparing to discharge and wants to save her energy for when she gets home. Pt reports she has been mobilizing well and does not need a PT session at this time. Acute PT will continue to follow until discharge is complete.   Zenaida Niece 02/18/2019, 12:11 PM

## 2019-02-18 NOTE — Progress Notes (Signed)
Pt accidentally removed IV and stated she should be going home today. RN paged Vanessa Jay and stated that was correct. Okay to have no IV access.

## 2019-02-18 NOTE — Progress Notes (Signed)
Patient ID: [name], female   DOB: 1961/02/15, 58 y.o.   MRN: [id number] S: Doing well  O:BP (!) 137/58 (BP Location: Right Arm)   Pulse 82   Temp 98.5 F (36.9 C) (Oral)   Resp 14   Ht 5\' 9"  (1.753 m)   Wt 116.7 kg   SpO2 94%   BMI 37.99 kg/m   Intake/Output Summary (Last 24 hours) at 02/18/2019 0947 Last data filed at 02/17/2019 1752 Gross per 24 hour  Intake 600 ml  Output 1200 ml  Net -600 ml   Intake/Output: I/O last 3 completed shifts: In: 600 [I.V.:600] Out: 1200 [Other:1200]  Intake/Output this shift:  No intake/output data recorded. Weight change:  Gen:NAD CVS: No rub Resp: cta Abd: +BS, soft, NT ND Ext: trace pretibial edema, LUE AVF +T/B  Recent Labs  Lab 02/12/19 0318 02/13/19 0150 02/14/19 0309 02/15/19 0319 02/16/19 0253 02/17/19 0231 02/18/19 0233  NA 142 140 142 141 138 139 137  K 3.8 3.5 3.7 3.8 3.8 3.9 4.4  CL 105 105 104 106 100 104 99  CO2 20* 24 25 23 25 23 27   GLUCOSE 100* 115* 78 98 95 91 159*  BUN 60* 43* 47* 53* 27* 35* 15  CREATININE 10.01* 8.07* 9.51* 10.11* 6.81* 7.60* 4.67*  ALBUMIN 2.6* 2.4* 2.5* 2.6* 2.5* 2.7* 2.6*  CALCIUM 8.8*  8.8 8.7* 8.9 8.7* 8.8* 9.0 8.5*  PHOS 5.9* 5.1* 5.7* 5.7* 4.4 5.3* 4.4   Liver Function Tests: Recent Labs  Lab 02/16/19 0253 02/17/19 0231 02/18/19 0233  ALBUMIN 2.5* 2.7* 2.6*   No results for input(s): LIPASE, AMYLASE in the last 168 hours. No results for input(s): AMMONIA in the last 168 hours. CBC: Recent Labs  Lab 02/12/19 0318 02/13/19 0150 02/14/19 0309 02/15/19 1654 02/17/19 0231  WBC 13.6* 10.8* 9.5 12.9* 10.5  NEUTROABS 10.2* 7.4 6.0  --   --   HGB 9.6* 9.5* 9.1* 9.5* 9.0*  HCT 29.8* 29.6* 29.6* 30.2* 28.0*  MCV 82.5 83.1 85.1 84.8 84.3  PLT 259 198 152 65* 83*   Cardiac Enzymes: No results for input(s): CKTOTAL, CKMB, CKMBINDEX, TROPONINI in the last 168 hours. CBG: Recent Labs  Lab 02/17/19 1856 02/17/19 2004 02/18/19 0109 02/18/19 0452 02/18/19 0755   GLUCAP 118* 168* 116* 115* 109*    Iron Studies: No results for input(s): IRON, TIBC, TRANSFERRIN, FERRITIN in the last 72 hours. Studies/Results: Vas [place] Upper Ext Vein Mapping (pre-op Avf)  Result Date: 02/16/2019 UPPER EXTREMITY VEIN MAPPING  Indications: Pre-access. Comparison Study: No prior study. Performing Technologist: Antonieta Pert RDMS, RVT  Examination Guidelines: A complete evaluation includes B-mode imaging, spectral Doppler, color Doppler, and power Doppler as needed of all accessible portions of each vessel. Bilateral testing is considered an integral part of a complete examination. Limited examinations for reoccurring indications may be performed as noted. +-----------------+-------------+----------+----------+ Right Cephalic   Diameter (cm)Depth (cm) Findings  +-----------------+-------------+----------+----------+ Shoulder             0.42        1.51              +-----------------+-------------+----------+----------+ Prox upper arm       0.38        1.95              +-----------------+-------------+----------+----------+ Mid upper arm        0.53        0.80   branching  +-----------------+-------------+----------+----------+ Dist upper arm  0.50        0.54   branching  +-----------------+-------------+----------+----------+ Antecubital fossa    0.33        0.48              +-----------------+-------------+----------+----------+ Prox forearm         0.41        0.19   branching  +-----------------+-------------+----------+----------+ Mid forearm          0.38        0.30              +-----------------+-------------+----------+----------+ Dist forearm         0.33        0.30              +-----------------+-------------+----------+----------+ Wrist                0.41        0.36   Thrombosed +-----------------+-------------+----------+----------+ +-----------------+-------------+----------+---------+ Right Basilic     Diameter (cm)Depth (cm)Findings  +-----------------+-------------+----------+---------+ Mid upper arm        0.59                         +-----------------+-------------+----------+---------+ Dist upper arm       0.58                         +-----------------+-------------+----------+---------+ Antecubital fossa    0.44               branching +-----------------+-------------+----------+---------+ Prox forearm         0.33                         +-----------------+-------------+----------+---------+ Mid forearm          0.36                         +-----------------+-------------+----------+---------+ Distal forearm       0.32                         +-----------------+-------------+----------+---------+ Wrist                0.28                         +-----------------+-------------+----------+---------+ +-----------------+-------------+----------+---------+ Left Cephalic    Diameter (cm)Depth (cm)Findings  +-----------------+-------------+----------+---------+ Shoulder             0.32        1.66             +-----------------+-------------+----------+---------+ Prox upper arm       0.40        1.53             +-----------------+-------------+----------+---------+ Mid upper arm        0.43        1.24   branching +-----------------+-------------+----------+---------+ Dist upper arm       0.36        0.77             +-----------------+-------------+----------+---------+ Antecubital fossa    0.61        0.35             +-----------------+-------------+----------+---------+ Prox forearm         0.34        0.49             +-----------------+-------------+----------+---------+  Mid forearm          0.41        0.32   branching +-----------------+-------------+----------+---------+ Dist forearm         0.32        0.20             +-----------------+-------------+----------+---------+ Wrist                0.25        0.26    branching +-----------------+-------------+----------+---------+ +-----------------+-------------+----------+---------+ Left Basilic     Diameter (cm)Depth (cm)Findings  +-----------------+-------------+----------+---------+ Shoulder             0.49                         +-----------------+-------------+----------+---------+ Prox upper arm       0.49                         +-----------------+-------------+----------+---------+ Mid upper arm        0.49                         +-----------------+-------------+----------+---------+ Dist upper arm       0.47               branching +-----------------+-------------+----------+---------+ Antecubital fossa    0.40               branching +-----------------+-------------+----------+---------+ Prox forearm         0.44               branching +-----------------+-------------+----------+---------+ Mid forearm          0.40                         +-----------------+-------------+----------+---------+ Distal forearm       0.26               branching +-----------------+-------------+----------+---------+ Wrist                0.30                         +-----------------+-------------+----------+---------+ *See table(s) above for measurements and observations.  Diagnosing physician: Deitra Mayo MD Electronically signed by Deitra Mayo MD on 02/16/2019 at 3:29:18 PM.    Final    . amLODipine  10 mg Oral Daily  . carvedilol  25 mg Oral BID WC  . Chlorhexidine Gluconate Cloth  6 each Topical Daily  . cloNIDine  0.2 mg Oral TID  . heparin  5,000 Units Subcutaneous Q8H  . insulin aspart  0-15 Units Subcutaneous Q4H  . polyethylene glycol  17 g Oral BID  . senna  1 tablet Oral Daily    BMET    Component Value Date/Time   NA 137 02/18/2019 0233   NA 145 (H) 11/12/2018 0949   K 4.4 02/18/2019 0233   CL 99 02/18/2019 0233   CO2 27 02/18/2019 0233   GLUCOSE 159 (H) 02/18/2019 0233   BUN 15  02/18/2019 0233   BUN 34 (H) 11/12/2018 0949   CREATININE 4.67 (H) 02/18/2019 0233   CREATININE 1.19 (H) 09/28/2015 1524   CALCIUM 8.5 (L) 02/18/2019 0233   CALCIUM 8.8 02/12/2019 0318   GFRNONAA 9 (L) 02/18/2019 0233   GFRNONAA 51 (L) 09/28/2015 1524   GFRAA 11 (L) 02/18/2019 0233   GFRAA 59 (  L) 09/28/2015 1524   CBC    Component Value Date/Time   WBC 10.5 02/17/2019 0231   RBC 3.32 (L) 02/17/2019 0231   HGB 9.0 (L) 02/17/2019 0231   HGB 11.4 11/12/2018 0949   HCT 28.0 (L) 02/17/2019 0231   HCT 34.6 11/12/2018 0949   PLT 83 (L) 02/17/2019 0231   PLT 404 11/12/2018 0949   MCV 84.3 02/17/2019 0231   MCV 78 (L) 11/12/2018 0949   MCH 27.1 02/17/2019 0231   MCHC 32.1 02/17/2019 0231   RDW 15.3 02/17/2019 0231   RDW 15.0 11/12/2018 0949   LYMPHSABS 2.1 02/14/2019 0309   MONOABS 1.0 02/14/2019 0309   EOSABS 0.3 02/14/2019 0309   BASOSABS 0.0 02/14/2019 0309     Assessment/Plan:  1. AKI/CKD stage 4now ESRD- due to hypertensive emergency. 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 Started on HD 02/10/19. BUN/Cr cont to climb between treatments. 1. CLIP placement pending for outpatient HD 2. S/p LUEAVF creation 02/17/19  3. Continue with MWF 2. Hypertensive emergency- improved with meds and HD. 3. DM- off of metformin, management per primary svc 4. Anemia of CKD- on ESA and s/p feraheme 5. Secondary HPTH- stable without meds for now. 6. Disposition- has outpatient HD at [facility] MWF and can be discharged today and f/u at the unit on 02/19/19.  Donetta Potts, MD [employer] [phone]

## 2019-02-18 NOTE — Progress Notes (Addendum)
[facility] [place]  Subjective  - Doing well.  The left arm is a little sore.   Objective (!) 137/58 82 98.5 F (36.9 C) (Oral) 14 94%  Intake/Output Summary (Last 24 hours) at 02/18/2019 0906 Last data filed at 02/17/2019 1752 Gross per 24 hour  Intake 600 ml  Output 1200 ml  Net -600 ml    Left AC incision healing well Palpable radial pulse distally, palpable thrill in fistula Sensation and motor of left UE intact  Assessment/Planning: POD # 1 s/p Left arm brachiocephalic AV fistula creation  Plan to have her f/u with a fistula duplex in 5-6 weeks.  Activity as tolerates with left UE.  The fistula will be accessible in 12 weeks.  Roxy Horseman 02/18/2019 9:06 AM --  Laboratory Lab Results: Recent Labs    02/15/19 1654 02/17/19 0231  WBC 12.9* 10.5  HGB 9.5* 9.0*  HCT 30.2* 28.0*  PLT 65* 83*   BMET Recent Labs    02/17/19 0231 02/18/19 0233  NA 139 137  K 3.9 4.4  CL 104 99  CO2 23 27  GLUCOSE 91 159*  BUN 35* 15  CREATININE 7.60* 4.67*  CALCIUM 9.0 8.5*    COAG Lab Results  Component Value Date   INR 1.1 02/08/2019   INR 1.1 02/03/2019   INR 1.0 07/08/2018   No results found for: PTT   I have independently interviewed and examined patient and agree with PA assessment and plan above. [place] for [employer] from vascular standpoint. Will f/u with duplex in 4-6 weeks.   Shavette Shoaff C. Donzetta Matters, MD [facility] [place] Office: [phone] Pager: [phone]

## 2019-02-18 NOTE — TOC Transition Note (Signed)
Transition of Care [facility]) - CM/SW Discharge Note   Patient Details  Name: [name] MRN: [id number] Date of Birth: 11-Dec-1960  [facility] of [facility] CM/SW Contact:  Benard Halsted, [place] Phone Number: 02/18/2019, 9:44 AM   Clinical Narrative:    CSW spoke with patient regarding recommendation for outpatient therapy. Patient reported that she does not need any physical assistance at this time and declines outpatient PT. She also declines need for any equipment at home, though she asked CSW about a shower chair. CSW advised her that her insurance would not cover a shower chair but that she could get one from [place] or a medical equipment store. She reports that her daughter and son will be assisting her and is aware of her dialysis schedule. No other needs reported at this time.    Final next level of care: Home/Self Care Barriers to Discharge: No Barriers Identified   Patient Goals and CMS Choice Patient states their goals for this hospitalization and ongoing recovery are:: return to her home      Discharge Placement                       Discharge Plan and Services   Discharge Planning Services: CM Consult            DME Arranged: N/A DME Agency: NA       HH Arranged: NA HH Agency: NA        Social Determinants of Health (SDOH) Interventions     Readmission Risk Interventions Readmission Risk Prevention Plan 02/18/2019  Transportation Screening Complete  PCP or Specialist Appt within 5-7 Days Complete  Home Care Screening Complete  Medication Review (RN CM) Complete  Some recent data might be hidden

## 2019-02-18 NOTE — Progress Notes (Signed)
Family Medicine Teaching Service Daily Progress Note Intern Pager: [phone]  Patient name: [name] Medical record number: [id number] Date of birth: 16-Apr-1961 Age: 58 y.o. Gender: female  Primary Care Provider: Matilde Haymaker, MD Consultants: CCM, Nephrology, IR  Code Status: Full   Pt Overview and Major Events to Date:  10/02- Admitted to ICU for Hypertensive emergency 10/06- HD cath placement 10/07-HD started   Assessment and Plan: 58yo F with h/o T2DM, GERD, HLD, HTN, CKD presented with persistent emesis, chest pain, and headache found to be in hypertensive emergency in ED in setting of missed doses of BP meds.  Hypertensive emergency-improving BP stable overnight.  Required no as needed meds -Nephro following -Continue current meds amlodipine, carvedilol, clonidine -Hold BP meds on dialysis days   Kidney injury secondary to HTN emergency requiring dialysis Stable creatinine 4.67.  No documented output overnight, patient states that she is voiding.  Dialysis yesterday for 1.2 L. -Nephro following, HD AV fistula 10/14 -Avoid nephrotoxic agents -Follow-up outpatient dialysis Monday Wednesdays and Fridays.   Type 2 Diabetes-stable  CBGs stable, range 115-159.  No required insulin coverage overnight -Continue moderate sliding scale insulin coverage  Anemia of CKD-stable Asymptomatic -Per nephro patient currently on ESA and Feraheme -Monitor CBC every 2 days   FEN/GI: renal diet PPx: heparin  Disposition: Medically stable.  Plan for discharge 10/15  Subjective:  No acute events overnight.  Feeling good.  Wanting to go home.  Denies any chest pain, shortness of breath, abdominal pain.  Take good.  Last BM 10/14.  Voiding well.  Dialyzed 10/14.  Objective: Temp:  [97.5 F (36.4 C)-98.8 F (37.1 C)] 98.5 F (36.9 C) (10/14 2159) Pulse Rate:  [41-92] 82 (10/14 2159) Resp:  [14-24] 14 (10/14 2159) BP: (137-190)/(58-88) 137/58 (10/14 2159) SpO2:  [87 %-99 %] 94 %  (10/14 2159) Weight:  [115.8 kg-118.1 kg] 116.7 kg (10/15 0622)   Physical Exam:  General: 58 year old female, in no acute distress 58 year old female, in no acute distress  Neck: Supple, no JVD appreciated Cardiovascular: Rate and rhythm, no murmurs appreciated Respiratory: Clear to auscultation bilaterally, no rhonchi, no crackles Gastrointestinal: Soft, nontender, nondisplaced, bowel sounds present MSK: All extremities, trace lower extremity edema  Derm: HD temp cath site dressing intact, no obvious signs of inflammation or infection. Neuro: Alert and orientated x4 Psych: Pleasant, cooperative and answers all questions appropriately.     Laboratory: Recent Labs  Lab 02/14/19 0309 02/15/19 1654 02/17/19 0231  WBC 9.5 12.9* 10.5  HGB 9.1* 9.5* 9.0*  HCT 29.6* 30.2* 28.0*  PLT 152 65* 83*   Recent Labs  Lab 02/16/19 0253 02/17/19 0231 02/18/19 0233  NA 138 139 137  K 3.8 3.9 4.4  CL 100 104 99  CO2 25 23 27   BUN 27* 35* 15  CREATININE 6.81* 7.60* 4.67*  CALCIUM 8.8* 9.0 8.5*  GLUCOSE 95 91 159*    Imaging/Diagnostic Tests: No new imaging   Carollee Leitz, MD 02/18/2019, 9:53 AM PGY-1, [place] Intern pager: [phone], text pages welcome

## 2019-02-18 NOTE — Progress Notes (Signed)
Patient was stable at discharge. We reviewed the discharge education. Patient/Family verbalized understanding and had no further questions. She is aware of her dialysis scheduled tomorrow. Patient left with belongings in hand.

## 2019-02-19 DIAGNOSIS — N189 Chronic kidney disease, unspecified: Secondary | ICD-10-CM | POA: Insufficient documentation

## 2019-02-19 DIAGNOSIS — D631 Anemia in chronic kidney disease: Secondary | ICD-10-CM | POA: Insufficient documentation

## 2019-02-19 DIAGNOSIS — D689 Coagulation defect, unspecified: Secondary | ICD-10-CM

## 2019-02-19 DIAGNOSIS — N2581 Secondary hyperparathyroidism of renal origin: Secondary | ICD-10-CM | POA: Insufficient documentation

## 2019-02-19 DIAGNOSIS — E119 Type 2 diabetes mellitus without complications: Secondary | ICD-10-CM | POA: Insufficient documentation

## 2019-02-19 DIAGNOSIS — E1122 Type 2 diabetes mellitus with diabetic chronic kidney disease: Secondary | ICD-10-CM | POA: Insufficient documentation

## 2019-02-19 DIAGNOSIS — N186 End stage renal disease: Secondary | ICD-10-CM | POA: Insufficient documentation

## 2019-02-19 HISTORY — DX: Coagulation defect, unspecified: D68.9

## 2019-02-22 NOTE — Progress Notes (Signed)
   Subjective:    Patient ID: KYONNA FRIER, female    DOB: [birth year], 58 y.o.   MRN: [id number]   CC: hospital follow up  HPI: Hypertensive emergency Patient recently admitted on 10/2-10/15.  Was admitted in hypertensive emergency.  Patient initiated hemodialysis while inpatient and blood pressure seem to improve.  During admission patient's blood pressure seemed to improve while on dialysis.  Patient states that since discharge she has been feeling well.  Was discharged on Friday.  States that she has started her outpatient dialysis on a Monday Wednesday Friday schedule.  Last dialysis session was yesterday.  Dates that she was not discharged with any physical therapy and does not want referral to decubital therapy as an outpatient.  States that she does get dizzy when she stands up especially after her dialysis sessions.  Denies any shortness of breath or chest pain.  Denies any edema.  States that they recently checked her labs on Friday at the dialysis center.  Fistula was placed on 10/14 and is still maturing.  Currently using a port for her dialysis.  She overall is doing really well and states that she feels much better after dialysis.   Objective:  BP 118/62   Pulse 85   Wt 247 lb 9.6 oz (112.3 kg)   SpO2 99%   BMI 36.56 kg/m  Vitals and nursing note reviewed  General: well nourished, in no acute distress HEENT: normocephalic, no scleral icterus or conjunctival pallor  Neck: supple  Cardiac: RRR, clear S1 and S2, no murmurs, rubs, or gallops Respiratory: clear to auscultation bilaterally, no increased work of breathing Abdomen: soft, nontender, nondistended, no masses or organomegaly. Bowel sounds present Extremities: no edema or cyanosis. Warm, well perfused.   Skin: warm and dry, no rashes noted Neuro: alert and oriented, no focal deficits   Assessment & Plan:    HYPERTENSION, BENIGN SYSTEMIC Patient's current blood pressure medications include amlodipine 10 mg,  carvedilol 25 mg twice daily, clonidine 0.3 mg 3 times daily.  Patient is also on Lasix twice daily.  Patient is borderline hypotensive today especially with systolic of 62.  Seems to have a 30 pound weight loss.  Likely secondary to volume loss from dialysis.  Given that patient is having some dizziness when she stands after dialysis I am concerned that her blood pressure is 92 tightly controlled.  Will decrease clonidine to 0.2 mg twice daily as a slow wean.  If blood pressures remain low can consider then decreasing to 0.1 mg 3 times daily.  Will defer to PCP if blood pressure should be managed at [facility] versus nephrology.  Has scheduled a follow-up in 2 weeks with Dr. Ky Barban for blood pressure management to ensure that her blood pressures are remaining well controlled with this decreasing clonidine dose.  If blood pressure will be managed by nephrology she can follow-up there and started at [facility].  Patient advised to continue to check blood pressures at home and at dialysis center.  She will call us if she is found to be hypertensive at her nephrology center or at home.  Will not check BMP today as patient gets frequent checks by nephrologist at dialysis, last check was on Friday. Strict return precautions given.  Follow-up in 2 weeks.   Discussed patient with Dr. Andria Frames.    Return in about 15 days (around 03/10/2019).   Caroline More, DO, PGY-3

## 2019-02-23 ENCOUNTER — Ambulatory Visit (INDEPENDENT_AMBULATORY_CARE_PROVIDER_SITE_OTHER): Payer: BC Managed Care – PPO | Admitting: Family Medicine

## 2019-02-23 ENCOUNTER — Other Ambulatory Visit: Payer: Self-pay

## 2019-02-23 DIAGNOSIS — I10 Essential (primary) hypertension: Secondary | ICD-10-CM

## 2019-02-23 NOTE — Assessment & Plan Note (Addendum)
Patient's current blood pressure medications include amlodipine 10 mg, carvedilol 25 mg twice daily, clonidine 0.3 mg 3 times daily.  Patient is also on Lasix twice daily.  Patient is borderline hypotensive today especially with systolic of 62.  Seems to have a 30 pound weight loss.  Likely secondary to volume loss from dialysis.  Given that patient is having some dizziness when she stands after dialysis I am concerned that her blood pressure is 92 tightly controlled.  Will decrease clonidine to 0.2 mg twice daily as a slow wean.  If blood pressures remain low can consider then decreasing to 0.1 mg 3 times daily.  Will defer to PCP if blood pressure should be managed at [facility] versus nephrology.  Has scheduled a follow-up in 2 weeks with Dr. Ky Barban for blood pressure management to ensure that her blood pressures are remaining well controlled with this decreasing clonidine dose.  If blood pressure will be managed by nephrology she can follow-up there and started at [facility].  Patient advised to continue to check blood pressures at home and at dialysis center.  She will call us if she is found to be hypertensive at her nephrology center or at home.  Will not check BMP today as patient gets frequent checks by nephrologist at dialysis, last check was on Friday. Strict return precautions given.  Follow-up in 2 weeks.   Discussed patient with Dr. Andria Frames.

## 2019-02-23 NOTE — Patient Instructions (Signed)
It was a pleasure seeing you today.   Today we discussed your hospital follow up  For your BP: I have decreased her clonidine to 0.2 mg twice a day instead of what you were previously on (3 times a day).  This is because your blood pressure was borderline low today and you did report that you were having some dizziness when you stand up.  Please monitor your blood pressures at home or at dialysis.  If they are high please give [place] a call.  I have scheduled you to follow-up in 2 weeks with Dr. Ky Barban so we can discuss your blood pressure further.  If your nephrologist would like to follow this up instead of at [facility] they can also follow this up.  Please come in sooner if you are having worsening blood pressures either high or low.  Please follow up in 2 weeks or sooner if symptoms persist or worsen. Please call the clinic immediately if you have any concerns.   Our clinic's number is [phone]. Please call with questions or concerns.   Please go to the emergency room if you have chest pain, shortness of breath significantly elevated blood pressures, or loss of consciousness   Thank you,  Caroline More, DO

## 2019-02-24 DIAGNOSIS — D509 Iron deficiency anemia, unspecified: Secondary | ICD-10-CM

## 2019-02-24 HISTORY — DX: Iron deficiency anemia, unspecified: D50.9

## 2019-03-03 ENCOUNTER — Other Ambulatory Visit (HOSPITAL_COMMUNITY): Payer: Self-pay | Admitting: Nephrology

## 2019-03-03 DIAGNOSIS — Z992 Dependence on renal dialysis: Secondary | ICD-10-CM

## 2019-03-03 DIAGNOSIS — N186 End stage renal disease: Secondary | ICD-10-CM

## 2019-03-04 ENCOUNTER — Other Ambulatory Visit: Payer: Self-pay | Admitting: Radiology

## 2019-03-05 ENCOUNTER — Other Ambulatory Visit: Payer: Self-pay

## 2019-03-05 ENCOUNTER — Ambulatory Visit (HOSPITAL_COMMUNITY)
Admission: RE | Admit: 2019-03-05 | Discharge: 2019-03-05 | Disposition: A | Discharge status: Home / Self Care | Payer: BC Managed Care – PPO | Source: Ambulatory Visit | Attending: Nephrology | Admitting: Nephrology

## 2019-03-05 ENCOUNTER — Encounter (HOSPITAL_COMMUNITY): Payer: Self-pay | Admitting: Diagnostic Radiology

## 2019-03-05 DIAGNOSIS — N186 End stage renal disease: Secondary | ICD-10-CM | POA: Insufficient documentation

## 2019-03-05 DIAGNOSIS — Z992 Dependence on renal dialysis: Secondary | ICD-10-CM | POA: Diagnosis not present

## 2019-03-05 HISTORY — PX: IR FLUORO GUIDE CV LINE RIGHT: IMG2283

## 2019-03-05 LAB — GLUCOSE, CAPILLARY: Glucose-Capillary: 101 mg/dL — ABNORMAL HIGH (ref 70–99)

## 2019-03-05 IMAGING — XA IR FLUORO GUIDE CV LINE*R*
1 series · 1 of 1 positions shown · non-contrast
Comparison: none

INDICATION: 58-year-old with end-stage renal disease and poorly functioning
dialysis catheter. Patient presents for dialysis catheter exchange.

[Series 2: dr (person_name) · 1 of 1 slices shown]
[im 1/1]
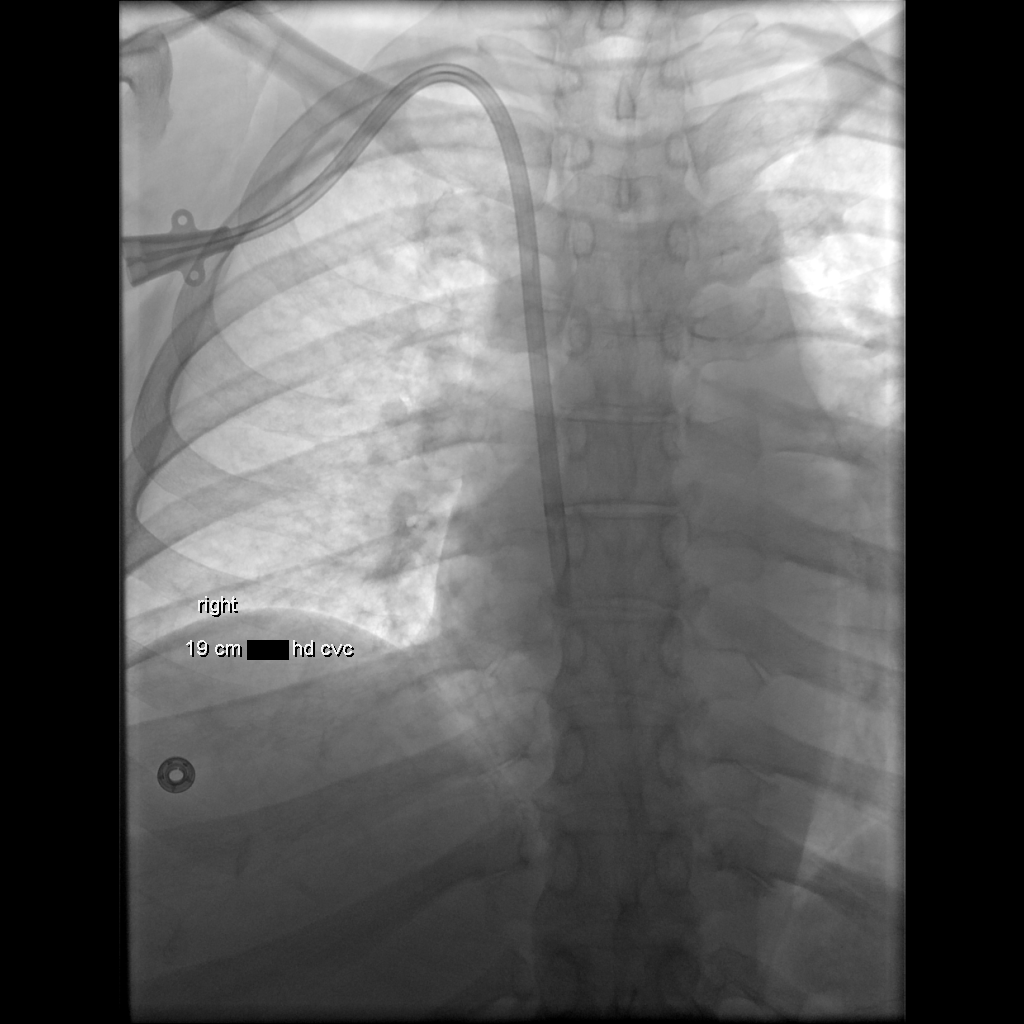

[1 of 1 positions shown; findings below may reference images not displayed]

EXAM:
EXCHANGE OF TUNNELED DIALYSIS CATHETER WITH FLUOROSCOPY

MEDICATIONS:
Ancef 2 g; The antibiotic was administered within an appropriate
time interval prior to skin puncture.

ANESTHESIA/SEDATION:
None

FLUOROSCOPY TIME:  Fluoroscopy Time: 30 seconds, 4 mGy

COMPLICATIONS:
None immediate.

PROCEDURE:
The procedure was explained to the patient. The risks and benefits
of the procedure were discussed and the patient's questions were
addressed. Informed consent was obtained from the patient. The right
chest and existing catheter were prepped and draped in sterile
fashion. Heparin was removed from both lumens. The catheter cuff was
easily exposed with manual traction. Catheter was removed over a
stiff Glidewire. A new 19 cm tip to cuff Palindrome catheter was
advanced over the wire. Catheter tip was positioned at the SVC and
right atrium junction. Both lumens aspirated and flushed well.
Appropriate amount of heparin was placed in both lumens. Catheter
was sutured to skin with Prolene suture.

Fluoroscopic images were taken and saved for this procedure.
FINDINGS: New catheter tip at the SVC and right atrium junction.
IMPRESSION: Successful exchange of the right chest tunneled dialysis catheter
with fluoroscopy. Catheter is ready to be used.

## 2019-03-05 MED ORDER — CEFAZOLIN SODIUM-DEXTROSE 2-4 GM/100ML-% IV SOLN
INTRAVENOUS | Status: AC
  Filled 2019-03-05: qty 100

## 2019-03-05 MED ORDER — CHLORHEXIDINE GLUCONATE 4 % EX LIQD
CUTANEOUS | Status: AC
  Filled 2019-03-05: qty 15

## 2019-03-05 MED ORDER — HEPARIN SODIUM (PORCINE) 1000 UNIT/ML IJ SOLN
INTRAMUSCULAR | Status: AC
  Filled 2019-03-05: qty 1

## 2019-03-05 MED ORDER — SODIUM CHLORIDE 0.9 % IV SOLN: INTRAVENOUS | Status: DC

## 2019-03-05 MED ORDER — LIDOCAINE HCL 1 % IJ SOLN
INTRAMUSCULAR | Status: AC
  Filled 2019-03-05: qty 20

## 2019-03-05 MED ORDER — CEFAZOLIN SODIUM-DEXTROSE 2-4 GM/100ML-% IV SOLN
2.0000 g | INTRAVENOUS | Status: AC
  Administered 2019-03-05: 11:00:00 2 g via INTRAVENOUS

## 2019-03-05 MED ORDER — LIDOCAINE HCL (PF) 1 % IJ SOLN
INTRAMUSCULAR | Status: DC | PRN
  Administered 2019-03-05: 10 mL

## 2019-03-05 NOTE — Procedures (Signed)
Interventional Radiology Procedure:   Indications: Poorly functioning HD catheter  Procedure: Tunneled dialysis catheter exchange  Findings: 19 cm Palindrome tip at SVC/RA junction  Complications: None     EBL: less than 10 ml  Plan: Dialysis catheter is ready to use.     Prabhjot Piscitello R. Anselm Pancoast, MD  Pager: [phone]

## 2019-03-10 ENCOUNTER — Ambulatory Visit (INDEPENDENT_AMBULATORY_CARE_PROVIDER_SITE_OTHER): Payer: BC Managed Care – PPO | Admitting: Family Medicine

## 2019-03-10 ENCOUNTER — Other Ambulatory Visit: Payer: Self-pay

## 2019-03-10 ENCOUNTER — Encounter: Payer: Self-pay | Admitting: Family Medicine

## 2019-03-10 VITALS — BP 152/84 | HR 87 | Wt 253.0 lb

## 2019-03-10 DIAGNOSIS — E119 Type 2 diabetes mellitus without complications: Secondary | ICD-10-CM

## 2019-03-10 DIAGNOSIS — I10 Essential (primary) hypertension: Secondary | ICD-10-CM | POA: Diagnosis not present

## 2019-03-10 LAB — POCT GLYCOSYLATED HEMOGLOBIN (HGB A1C): HbA1c, POC (controlled diabetic range): 5.1 % (ref 0.0–7.0)

## 2019-03-10 NOTE — Progress Notes (Signed)
  Subjective:   Patient ID: [name]    DOB: [birth year], 58 y.o. female   MRN: [id number]  [name] is a 58 y.o. female with a history of HTN, renal artery aneursym, OSA, GERD, T2DM, knee OA, uterine leiomyoma, former smoker, HLD, morbid obesity, ESRD here for   Hypertension: - recent hospitalization 10/2-10/5 for hypertensive emergency, now ESRD MWF. Fistula placed 10/14. At hospital f/u was hypotensive to SBP 60s and symptomatic, decreased clonidine at that time. - had port replacement Friday. Fistula still maturing. - Medications: amlodipine 10mg , coreg 25mg  BID, clonidine 0.2mg  BID, lasix 40mg  BID - Doesn't take antihypertensives prior to dialysis. - Compliance: good - Checking BP at home: yes, SBP 140s. SBP is around 150s after dialysis per patient report. - Denies any SOB, CP, medication SEs, or symptoms of hypotension. - Diet: appetite starting to come back, eating 1 meal per day - Exercise: stays active in the house during the day  Diabetes, Type 2 - Last A1c 6.8 11/2018 - Medications: none, diet controlled - Diet: see above - Exercise: see above - Eye exam: UTD, will obtain records - Foot exam: due - Microalbumin: n/a - Statin: yes - Denies symptoms of hypoglycemia  Review of Systems:  Per HPI.  Medications and smoking status reviewed.  Objective:   BP (!) 152/84   Pulse 87   Wt 253 lb (114.8 kg)   SpO2 96%   BMI 37.36 kg/m  Vitals and nursing note reviewed.  General: elderly, overweight female, in no acute distress with non-toxic appearance CV: regular rate and rhythm without murmurs, rubs, or gallops, 1+ pitting lower extremity edema Lungs: clear to auscultation bilaterally with normal work of breathing Skin: warm, dry.  Port and fistula sites clean and intact without signs of infection  Assessment & Plan:   Essential hypertension Doing well, no longer with symptomatic hypotension.  At goal for age after decreasing clonidine at last visit.  Advised  patient to check with nephrologist to see if they prefer managing her hypertension given she is now ESRD.  Otherwise can follow-up in 6 months.  Type 2 diabetes mellitus without complications (HCC) Diet controlled with good control, no changes made today.  Suspect sugars may increase as patient's appetite continues to return.  Will obtain updated eye exam records, last seen at [facility] eye care a month ago.  Declined pneumococcal vaccine today.  Follow-up in 6 months.  Orders Placed This Encounter  Procedures  . HgB A1c   No orders of the defined types were placed in this encounter.   Rory Percy, DO PGY-3, [facility] 03/10/2019 9:46 AM

## 2019-03-10 NOTE — Patient Instructions (Signed)
It was great to see you!  Our plans for today:  - No changes to your blood pressure medication today. Check with the kidney doctors to see if they want to manage your blood pressure now that you are on dialysis. - Your A1c is 5.1 and shows great diabetic control. - We will get your records from your recent eye exam. - Come back in 6 months for blood pressure and diabetes follow up.  Take care and seek immediate care sooner if you develop any concerns.   Dr. [facility]

## 2019-03-10 NOTE — Assessment & Plan Note (Addendum)
Diet controlled with good control, no changes made today.  Suspect sugars may increase as patient's appetite continues to return.  Will obtain updated eye exam records, last seen at [facility] eye care a month ago.  Declined pneumococcal vaccine today.  Follow-up in 6 months.

## 2019-03-10 NOTE — Assessment & Plan Note (Signed)
Doing well, no longer with symptomatic hypotension.  At goal for age after decreasing clonidine at last visit.  Advised patient to check with nephrologist to see if they prefer managing her hypertension given she is now ESRD.  Otherwise can follow-up in 6 months.

## 2019-03-23 ENCOUNTER — Other Ambulatory Visit: Payer: Self-pay

## 2019-03-23 DIAGNOSIS — N186 End stage renal disease: Secondary | ICD-10-CM

## 2019-03-26 ENCOUNTER — Other Ambulatory Visit: Payer: Self-pay

## 2019-03-26 ENCOUNTER — Ambulatory Visit (INDEPENDENT_AMBULATORY_CARE_PROVIDER_SITE_OTHER): Payer: Self-pay | Admitting: Physician Assistant

## 2019-03-26 ENCOUNTER — Ambulatory Visit (HOSPITAL_COMMUNITY)
Admission: RE | Admit: 2019-03-26 | Discharge: 2019-03-26 | Disposition: A | Discharge status: Home / Self Care | Payer: BC Managed Care – PPO | Source: Ambulatory Visit | Attending: Vascular Surgery | Admitting: Vascular Surgery

## 2019-03-26 ENCOUNTER — Other Ambulatory Visit: Payer: Self-pay | Admitting: *Deleted

## 2019-03-26 ENCOUNTER — Encounter: Payer: Self-pay | Admitting: *Deleted

## 2019-03-26 VITALS — BP 128/78 | HR 74 | Temp 97.9°F | Resp 14 | Ht 64.0 in | Wt 249.7 lb

## 2019-03-26 DIAGNOSIS — N186 End stage renal disease: Secondary | ICD-10-CM | POA: Insufficient documentation

## 2019-03-26 NOTE — Progress Notes (Signed)
POST OPERATIVE OFFICE NOTE    CC:  F/u for surgery  HPI:  This is a 58 y.o. female who is s/p Left arm brachiocephalic AV fistula creation 02/17/2019 by Dr. Donzetta Matters.  She is here today for f/u visit.  She denise pain, loss of motor or sensation.    Allergies  Allergen Reactions  . Lisinopril Anaphylaxis and Other (See Comments)    angioedema  . Camellia Other (See Comments)  . Jardiance [Empagliflozin] Rash    Current Outpatient Medications  Medication Sig Dispense Refill  . Accu-Chek Softclix Lancets lancets Use as instructed 100 each 12  . acetaminophen (TYLENOL) 500 MG tablet Take 1,000 mg by mouth every 6 (six) hours as needed for moderate pain or headache.    Marland Kitchen amLODipine (NORVASC) 10 MG tablet TAKE 1 TABLET BY MOUTH ONCE DAILY (Patient taking differently: Take 10 mg by mouth daily. ) 30 tablet 0  . aspirin EC 325 MG tablet Take 325 mg by mouth daily.    Marland Kitchen atorvastatin (LIPITOR) 20 MG tablet TAKE 1 TABLET BY MOUTH ONCE DAILY AT  6PM (Patient taking differently: Take 20 mg by mouth daily at 6 PM. ) 90 tablet 2  . B Complex-C-Folic Acid (DIALYVITE 694) 0.8 MG TABS Take 1 tablet by mouth daily.    . Blood Glucose Monitoring Suppl (ACCU-CHEK AVIVA PLUS) w/Device KIT Use to check sugar three times a day 1 kit 0  . carvedilol (COREG) 25 MG tablet Take 1 tablet (25 mg total) by mouth 2 (two) times daily with a meal. 180 tablet 1  . cloNIDine (CATAPRES) 0.2 MG tablet Take 0.2 mg by mouth 2 (two) times daily.    . ferrous sulfate 325 (65 FE) MG tablet Take 325 mg by mouth daily with breakfast.    . furosemide (LASIX) 40 MG tablet Take 40 mg by mouth 2 (two) times daily.    Marland Kitchen glucose blood (ACCU-CHEK AVIVA PLUS) test strip Use as instructed 100 each 12  . ketorolac (ACULAR) 0.4 % SOLN Place 1 drop into the right eye 4 (four) times daily.    . Lancets (ACCU-CHEK SOFT TOUCH) lancets Use to check sugars three times a day 100 each 12  . polyethylene glycol (MIRALAX / GLYCOLAX) packet Take 17 g  by mouth daily as needed for moderate constipation. 30 each 0  . Potassium Chloride ER 20 MEQ TBCR Take 1 tablet by mouth daily.    . potassium chloride SA (K-DUR) 20 MEQ tablet Take 20 mEq by mouth daily.    . prednisoLONE acetate (PRED FORTE) 1 % ophthalmic suspension Place 1 drop into the right eye 4 (four) times daily.    . SYSTANE ULTRA 0.4-0.3 % SOLN Apply 1 drop to eye 4 (four) times daily.    . traMADol (ULTRAM) 50 MG tablet Take 1 tablet (50 mg total) by mouth every 6 (six) hours as needed. 10 tablet 0  . Vitamin D, Ergocalciferol, (DRISDOL) 1.25 MG (50000 UT) CAPS capsule Take 50,000 Units by mouth every Monday.      No current facility-administered medications for this visit.      ROS:  See HPI  Physical Exam:  +------------+----------+-------------+----------+----------------+ OUTFLOW VEINPSV (cm/s)Diameter (cm)Depth (cm)    Describe     +------------+----------+-------------+----------+----------------+ Shoulder       101        0.53        1.47                    +------------+----------+-------------+----------+----------------+  Prox UA        116        0.53        1.12                    +------------+----------+-------------+----------+----------------+ Mid UA         121        0.55        0.61                    +------------+----------+-------------+----------+----------------+ Dist UA        227        0.78        0.40                    +------------+----------+-------------+----------+----------------+ AC Fossa       221        1.22        0.17   competing branch +------------+----------+-------------+----------+----------------+   Incision:  Well healed Extremities:  Left UE palpable thrill at anastomosis, palpable radial pulse   Assessment/Plan:  This is a 58 y.o. female who is s/p:Left BC fistula.    The fistula is deeper than 0.6 cm from the skin surface.  We will schedule her for superficialization of the fistula.   HD MWF.     Roxy Horseman , PA-C [facility] [phone]  Clinic MD:  Donzetta Matters

## 2019-04-02 ENCOUNTER — Observation Stay (HOSPITAL_COMMUNITY)
Admission: EM | Admit: 2019-04-02 | Discharge: 2019-04-04 | Disposition: A | Discharge status: Home / Self Care | Payer: BC Managed Care – PPO | Attending: Family Medicine | Admitting: Family Medicine

## 2019-04-02 ENCOUNTER — Encounter (HOSPITAL_COMMUNITY): Payer: Self-pay | Admitting: Emergency Medicine

## 2019-04-02 ENCOUNTER — Observation Stay (HOSPITAL_COMMUNITY): Payer: BC Managed Care – PPO

## 2019-04-02 ENCOUNTER — Emergency Department (HOSPITAL_COMMUNITY): Payer: BC Managed Care – PPO

## 2019-04-02 ENCOUNTER — Other Ambulatory Visit: Payer: Self-pay

## 2019-04-02 DIAGNOSIS — M25562 Pain in left knee: Secondary | ICD-10-CM | POA: Insufficient documentation

## 2019-04-02 DIAGNOSIS — R000 Tachycardia, unspecified: Secondary | ICD-10-CM | POA: Diagnosis not present

## 2019-04-02 DIAGNOSIS — Z87891 Personal history of nicotine dependence: Secondary | ICD-10-CM | POA: Insufficient documentation

## 2019-04-02 DIAGNOSIS — E1122 Type 2 diabetes mellitus with diabetic chronic kidney disease: Secondary | ICD-10-CM | POA: Insufficient documentation

## 2019-04-02 DIAGNOSIS — R0789 Other chest pain: Secondary | ICD-10-CM | POA: Diagnosis present

## 2019-04-02 DIAGNOSIS — R079 Chest pain, unspecified: Secondary | ICD-10-CM | POA: Diagnosis present

## 2019-04-02 DIAGNOSIS — N2581 Secondary hyperparathyroidism of renal origin: Secondary | ICD-10-CM | POA: Diagnosis not present

## 2019-04-02 DIAGNOSIS — Z992 Dependence on renal dialysis: Secondary | ICD-10-CM | POA: Diagnosis not present

## 2019-04-02 DIAGNOSIS — R6510 Systemic inflammatory response syndrome (SIRS) of non-infectious origin without acute organ dysfunction: Secondary | ICD-10-CM

## 2019-04-02 DIAGNOSIS — Z7982 Long term (current) use of aspirin: Secondary | ICD-10-CM | POA: Insufficient documentation

## 2019-04-02 DIAGNOSIS — Z20828 Contact with and (suspected) exposure to other viral communicable diseases: Secondary | ICD-10-CM | POA: Insufficient documentation

## 2019-04-02 DIAGNOSIS — E785 Hyperlipidemia, unspecified: Secondary | ICD-10-CM | POA: Diagnosis not present

## 2019-04-02 DIAGNOSIS — E1143 Type 2 diabetes mellitus with diabetic autonomic (poly)neuropathy: Secondary | ICD-10-CM | POA: Insufficient documentation

## 2019-04-02 DIAGNOSIS — E872 Acidosis: Secondary | ICD-10-CM | POA: Insufficient documentation

## 2019-04-02 DIAGNOSIS — I5032 Chronic diastolic (congestive) heart failure: Secondary | ICD-10-CM | POA: Diagnosis not present

## 2019-04-02 DIAGNOSIS — A084 Viral intestinal infection, unspecified: Principal | ICD-10-CM | POA: Insufficient documentation

## 2019-04-02 DIAGNOSIS — Z79899 Other long term (current) drug therapy: Secondary | ICD-10-CM | POA: Insufficient documentation

## 2019-04-02 DIAGNOSIS — K3184 Gastroparesis: Secondary | ICD-10-CM | POA: Diagnosis not present

## 2019-04-02 DIAGNOSIS — I132 Hypertensive heart and chronic kidney disease with heart failure and with stage 5 chronic kidney disease, or end stage renal disease: Secondary | ICD-10-CM | POA: Diagnosis not present

## 2019-04-02 DIAGNOSIS — G4733 Obstructive sleep apnea (adult) (pediatric): Secondary | ICD-10-CM | POA: Insufficient documentation

## 2019-04-02 DIAGNOSIS — D72829 Elevated white blood cell count, unspecified: Secondary | ICD-10-CM | POA: Diagnosis not present

## 2019-04-02 DIAGNOSIS — R112 Nausea with vomiting, unspecified: Secondary | ICD-10-CM

## 2019-04-02 DIAGNOSIS — M25561 Pain in right knee: Secondary | ICD-10-CM | POA: Insufficient documentation

## 2019-04-02 DIAGNOSIS — G8929 Other chronic pain: Secondary | ICD-10-CM | POA: Insufficient documentation

## 2019-04-02 DIAGNOSIS — R748 Abnormal levels of other serum enzymes: Secondary | ICD-10-CM | POA: Insufficient documentation

## 2019-04-02 DIAGNOSIS — D631 Anemia in chronic kidney disease: Secondary | ICD-10-CM | POA: Diagnosis not present

## 2019-04-02 DIAGNOSIS — N186 End stage renal disease: Secondary | ICD-10-CM | POA: Diagnosis not present

## 2019-04-02 DIAGNOSIS — R1011 Right upper quadrant pain: Secondary | ICD-10-CM

## 2019-04-02 HISTORY — DX: End stage renal disease: N18.6

## 2019-04-02 LAB — CBC WITH DIFFERENTIAL/PLATELET
Abs Immature Granulocytes: 0.09 10*3/uL — ABNORMAL HIGH (ref 0.00–0.07)
Basophils Absolute: 0 10*3/uL (ref 0.0–0.1)
Basophils Relative: 0 %
Eosinophils Absolute: 0 10*3/uL (ref 0.0–0.5)
Eosinophils Relative: 0 %
HCT: 45.6 % (ref 36.0–46.0)
Hemoglobin: 14.1 g/dL (ref 12.0–15.0)
Immature Granulocytes: 0 %
Lymphocytes Relative: 8 %
Lymphs Abs: 1.6 10*3/uL (ref 0.7–4.0)
MCH: 26.8 pg (ref 26.0–34.0)
MCHC: 30.9 g/dL (ref 30.0–36.0)
MCV: 86.5 fL (ref 80.0–100.0)
Monocytes Absolute: 0.7 10*3/uL (ref 0.1–1.0)
Monocytes Relative: 4 %
Neutro Abs: 17.9 10*3/uL — ABNORMAL HIGH (ref 1.7–7.7)
Neutrophils Relative %: 88 %
Platelets: 507 10*3/uL — ABNORMAL HIGH (ref 150–400)
RBC: 5.27 MIL/uL — ABNORMAL HIGH (ref 3.87–5.11)
RDW: 14.9 % (ref 11.5–15.5)
WBC: 20.4 10*3/uL — ABNORMAL HIGH (ref 4.0–10.5)
nRBC: 0 % (ref 0.0–0.2)

## 2019-04-02 LAB — COMPREHENSIVE METABOLIC PANEL
ALT: 15 U/L (ref 0–44)
AST: 23 U/L (ref 15–41)
Albumin: 4.1 g/dL (ref 3.5–5.0)
Alkaline Phosphatase: 110 U/L (ref 38–126)
Anion gap: 24 — ABNORMAL HIGH (ref 5–15)
BUN: 63 mg/dL — ABNORMAL HIGH (ref 6–20)
CO2: 19 mmol/L — ABNORMAL LOW (ref 22–32)
Calcium: 10.3 mg/dL (ref 8.9–10.3)
Chloride: 98 mmol/L (ref 98–111)
Creatinine, Ser: 8.51 mg/dL — ABNORMAL HIGH (ref 0.44–1.00)
GFR calc Af Amer: 5 mL/min — ABNORMAL LOW (ref >60)
GFR calc non Af Amer: 5 mL/min — ABNORMAL LOW (ref >60)
Glucose, Bld: 220 mg/dL — ABNORMAL HIGH (ref 70–99)
Potassium: 4.2 mmol/L (ref 3.5–5.1)
Sodium: 141 mmol/L (ref 135–145)
Total Bilirubin: 0.8 mg/dL (ref 0.3–1.2)
Total Protein: 9 g/dL — ABNORMAL HIGH (ref 6.5–8.1)

## 2019-04-02 LAB — GLUCOSE, CAPILLARY: Glucose-Capillary: 167 mg/dL — ABNORMAL HIGH (ref 70–99)

## 2019-04-02 LAB — TROPONIN I (HIGH SENSITIVITY)
Troponin I (High Sensitivity): 154 ng/L (ref <18)
Troponin I (High Sensitivity): 176 ng/L (ref <18)
Troponin I (High Sensitivity): 152 ng/L (ref <18)
Troponin I (High Sensitivity): 148 ng/L (ref <18)

## 2019-04-02 LAB — LIPASE, BLOOD: Lipase: 133 U/L — ABNORMAL HIGH (ref 11–51)

## 2019-04-02 LAB — SARS CORONAVIRUS 2 (TAT 6-24 HRS): SARS Coronavirus 2: NEGATIVE

## 2019-04-02 IMAGING — DX DG CHEST 1V PORT
1 series · 1 of 1 positions shown · non-contrast
Comparison: Chest radiograph [DATE]

CLINICAL DATA: Shortness of breath. Central chest pain. Vomiting.

EXAM:
PORTABLE CHEST 1 VIEW

[chest ap]
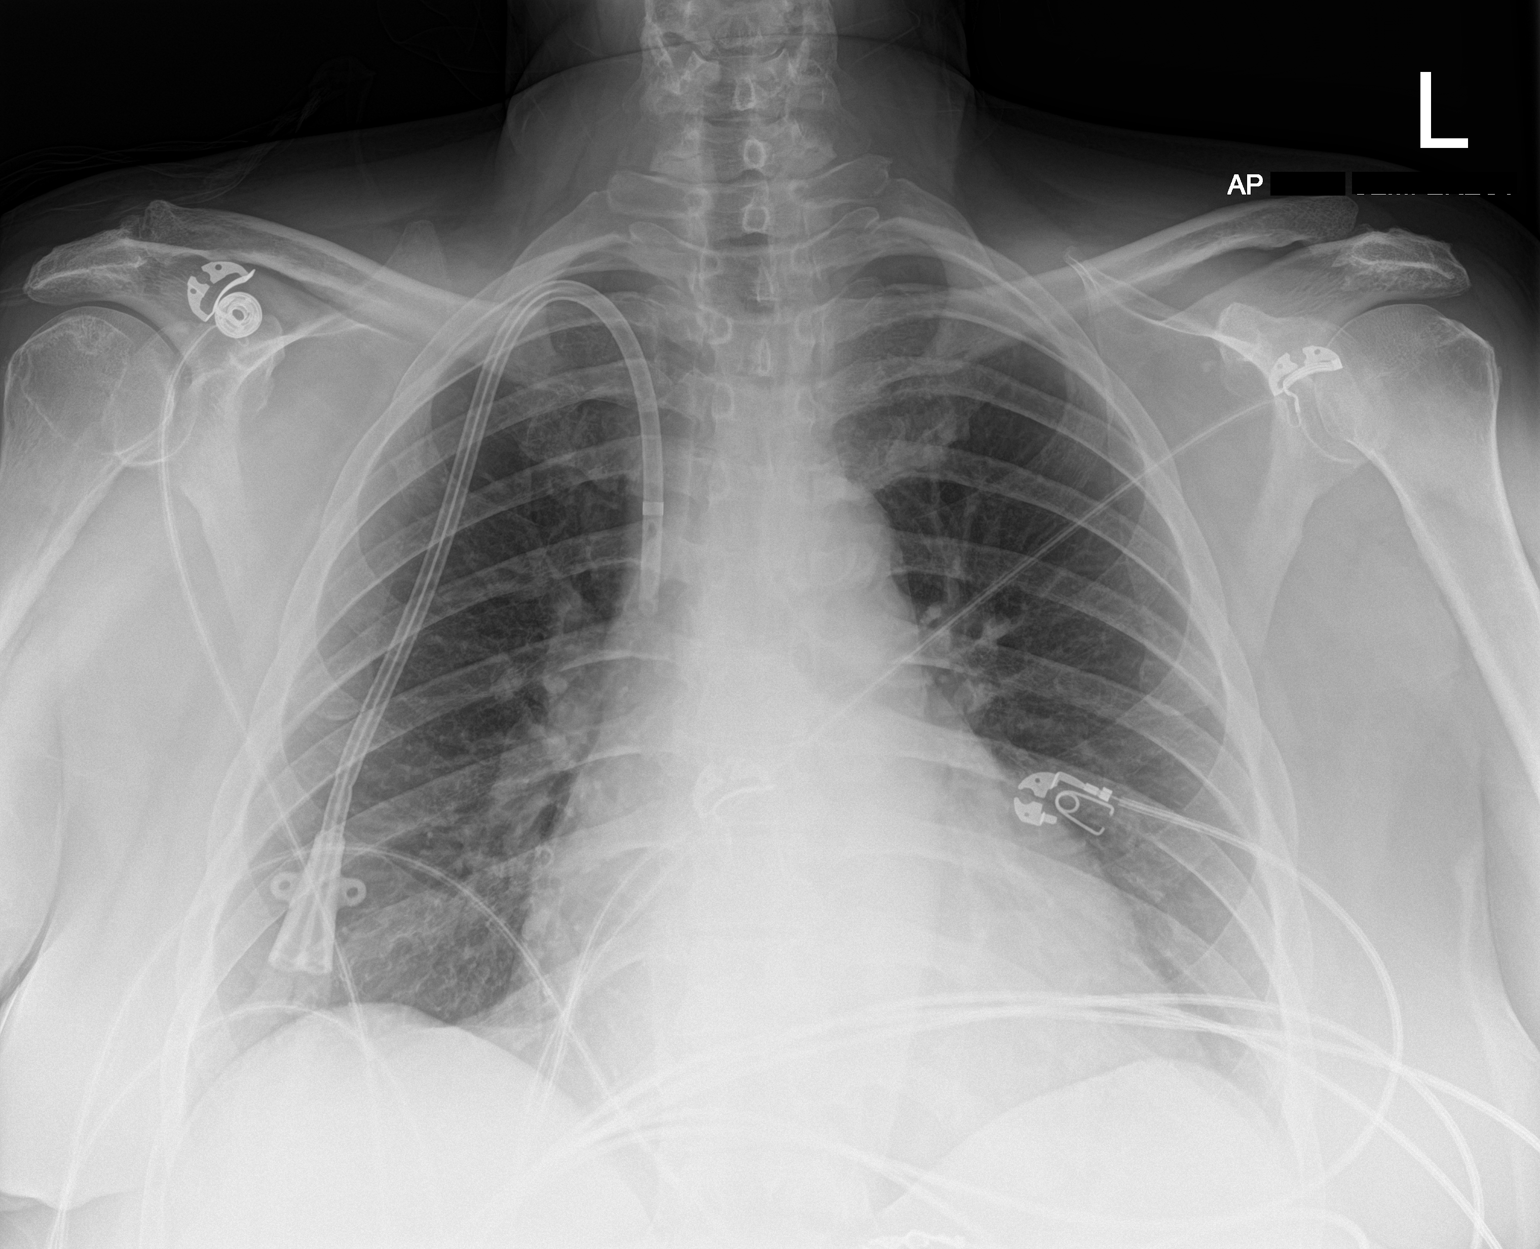

[1 of 1 positions shown; findings below may reference images not displayed]

FINDINGS: Right internal jugular dialysis catheter tip in the mid-upper SVC.
Mild cardiomegaly not significantly changed from prior. Unchanged
mediastinal contours. No pulmonary edema, focal airspace disease,
pleural effusion or pneumothorax. No acute osseous abnormalities are
seen.
IMPRESSION: 1. Unchanged mild cardiomegaly.
2. Right internal jugular dialysis catheter tip in the mid-upper
SVC. No other acute findings or change from prior exam.

## 2019-04-02 IMAGING — CT CT ABD-PELV W/O CM
2 of 4 series · 16 of 46 positions shown, 18 images · non-contrast
Comparison: CT the abdomen and pelvis [DATE].

CLINICAL DATA: 58-year-old female with history of abdominal
distension. Central chest pain. Vomiting for the past 2 days.

EXAM:
CT ABDOMEN AND PELVIS WITHOUT CONTRAST
TECHNIQUE: Multidetector CT imaging of the abdomen and pelvis was performed
following the standard protocol without IV contrast.

[Series 3: a/p w/o 5mm · axial · non-contrast · 0.98mm/px · z∈[+865,+1315]mm · 13 of 98 slices shown, 15 images]
[im 4/98  soft-tissue]
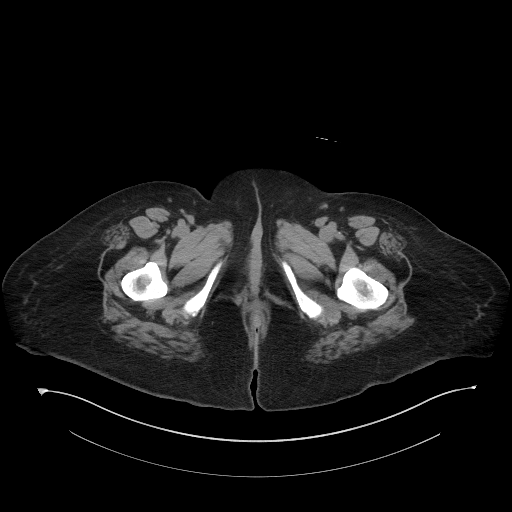
[im 4/98  bone]
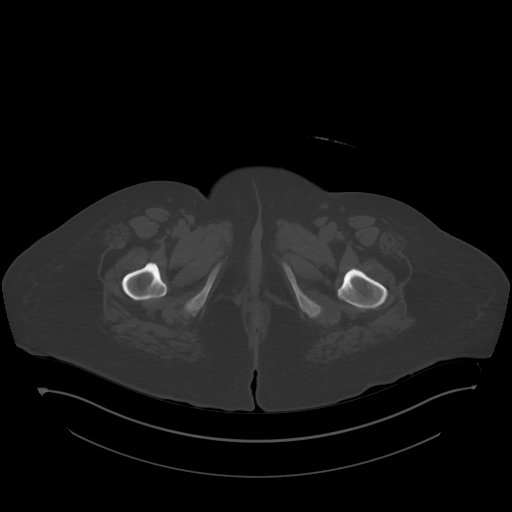
[im 12/98  soft-tissue]
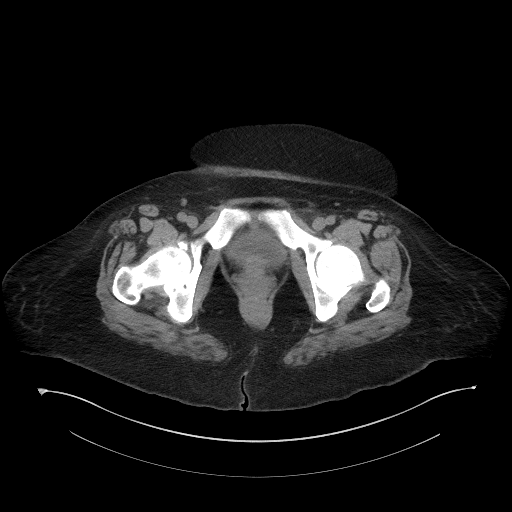
[im 20/98  soft-tissue]
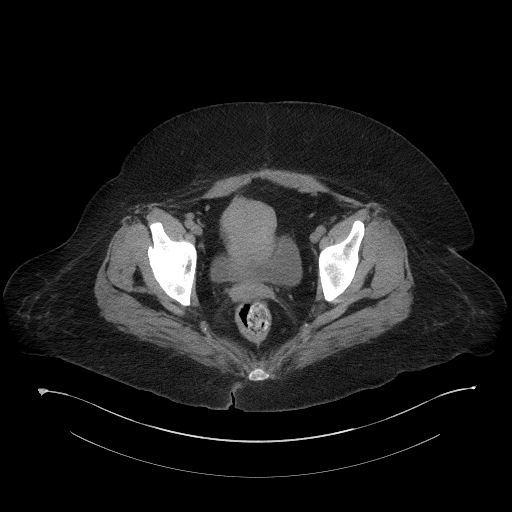
[im 28/98  soft-tissue]
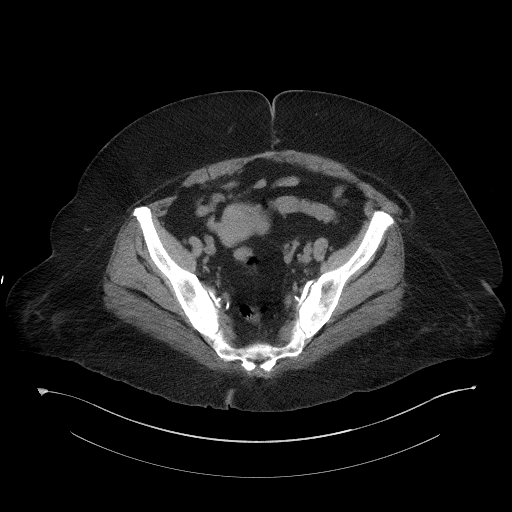
[im 35/98  soft-tissue]
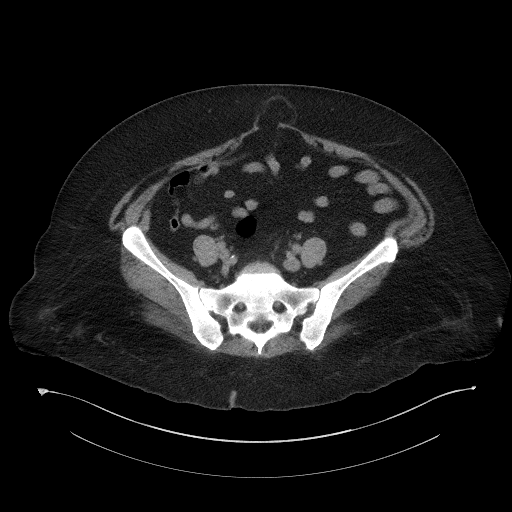
[im 43/98  soft-tissue]
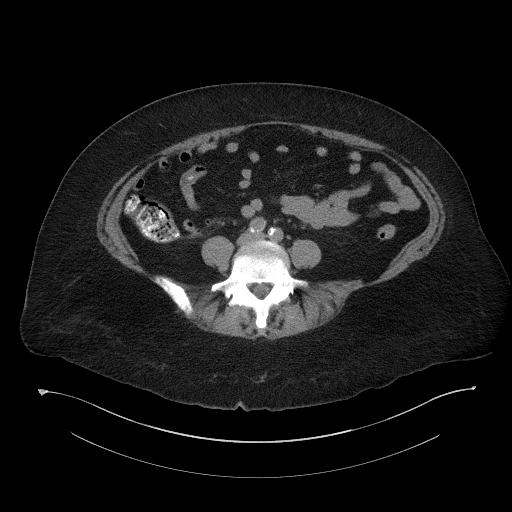
[im 51/98  soft-tissue]
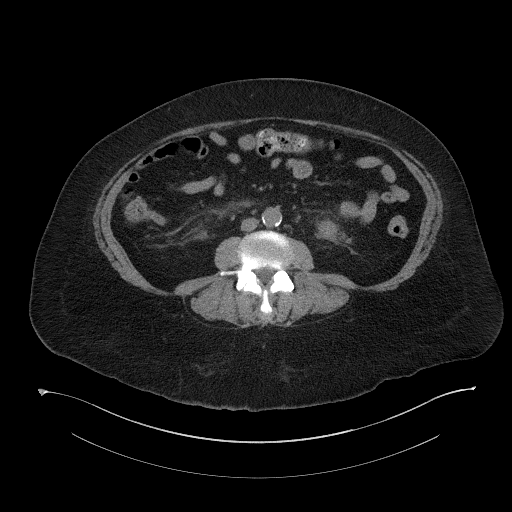
[im 55/98  soft-tissue]
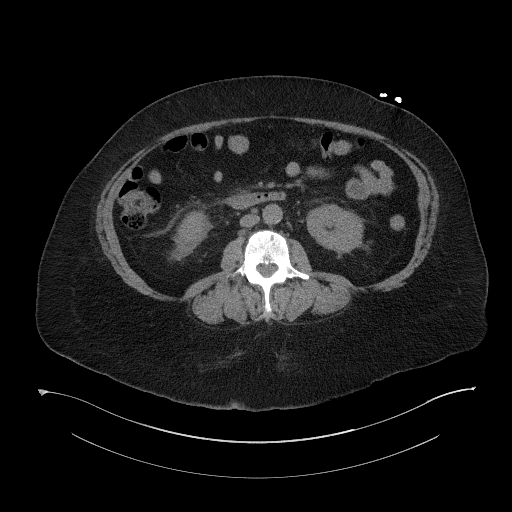
[im 63/98  soft-tissue]
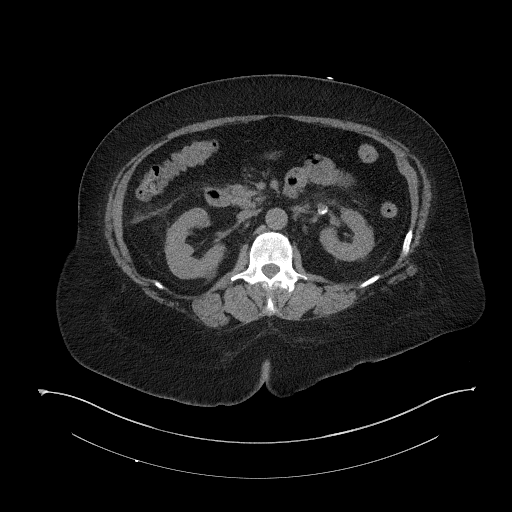
[im 63/98  bone]
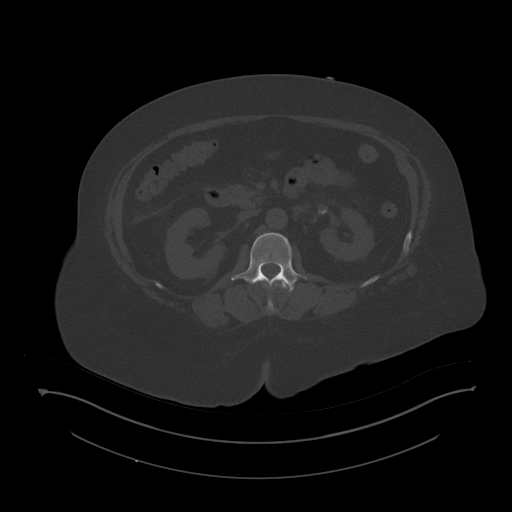
[im 70/98  soft-tissue]
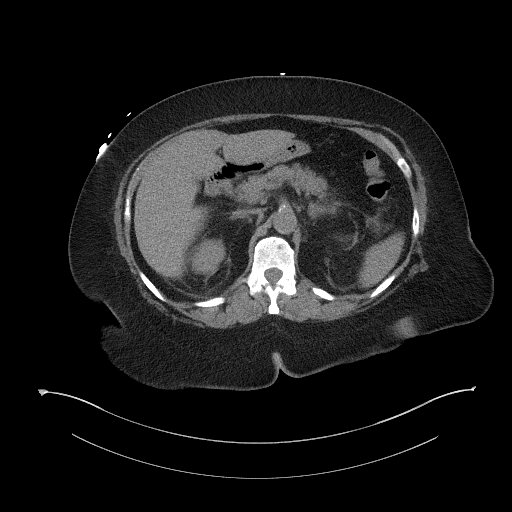
[im 78/98  soft-tissue]
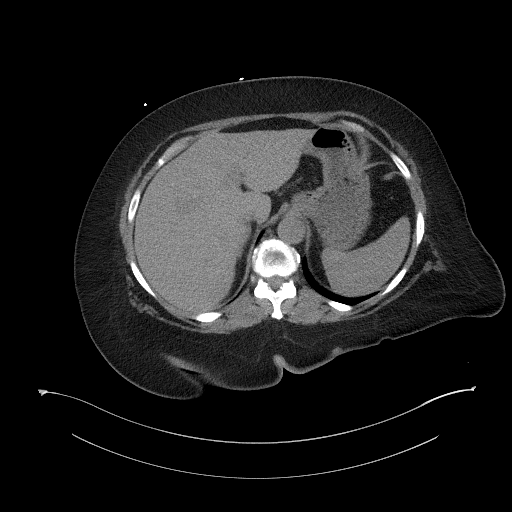
[im 86/98  soft-tissue]
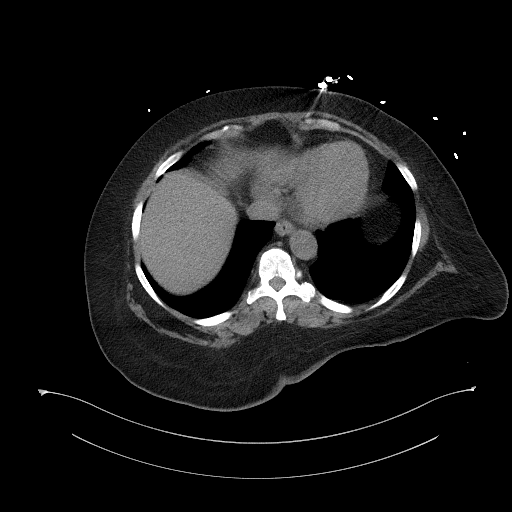
[im 94/98  soft-tissue]
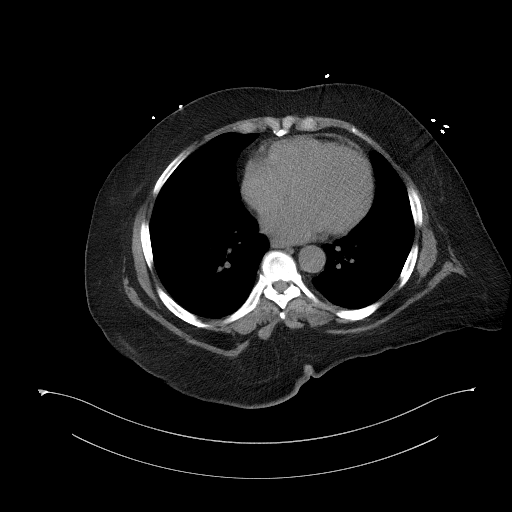

[Series 6: a/p w/o cor · coronal · non-contrast · 0.91mm/px · 3 of 181 slices shown]
[im 61/181  soft-tissue]
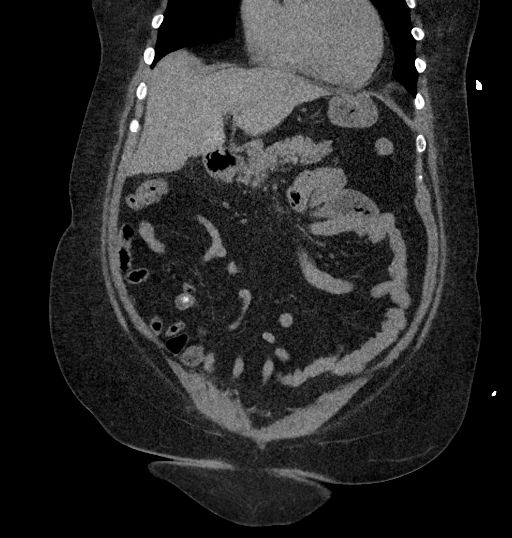
[im 81/181  soft-tissue]
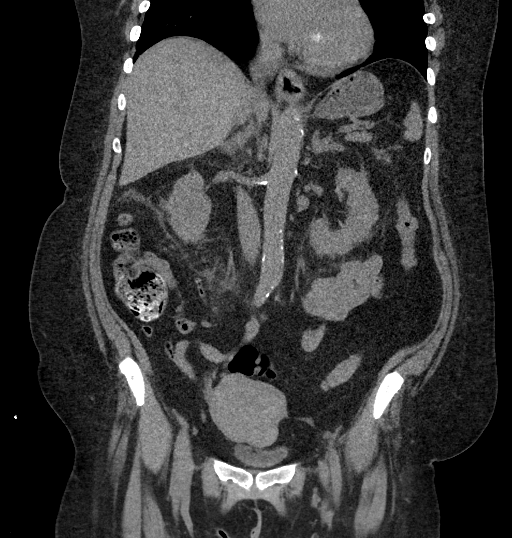
[im 101/181  soft-tissue]
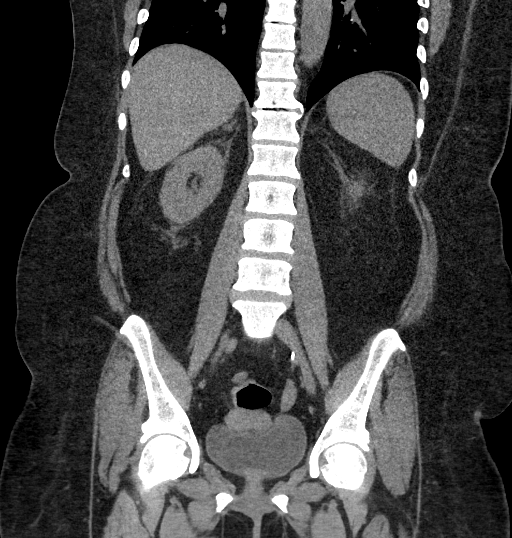

[16 of 46 positions shown; findings below may reference images not displayed]

FINDINGS: Lower chest: Unremarkable.

Hepatobiliary: No definite suspicious cystic or solid hepatic
lesions are confidently identified on today's noncontrast CT
examination. Status post cholecystectomy.

Pancreas: No definite pancreatic mass or peripancreatic fluid
collections or inflammatory changes noted on today's noncontrast CT
examination.

Spleen: Unremarkable.

Adrenals/Urinary Tract: No calcifications are identified within the
collecting system of either kidneys, along the course of either
ureter, or within the lumen of the urinary bladder. No
hydroureteronephrosis. Low-attenuation lesions in the right kidney
measuring up to 1.8 cm in the interpolar region, incompletely
characterized on today's non-contrast CT examination, but similar to
the prior study and statistically likely to represent cysts.
Previously noted gas and high attenuation in the interpolar region
of the left kidney has completely resolved (related to remote
biopsy). Small amount of bilateral perinephric soft tissue stranding
(nonspecific). Urinary bladder is normal in appearance. Bilateral
adrenal glands are normal in appearance.

Stomach/Bowel: Normal appearance of the stomach. No pathologic
dilatation of small bowel or colon. Numerous colonic diverticulae
are noted, without surrounding inflammatory changes to suggest an
acute diverticulitis at this time. Normal appendix.

Vascular/Lymphatic: Aortic atherosclerosis. No lymphadenopathy noted
in the abdomen or pelvis.

Reproductive: Uterus is enlarged and heterogeneous in appearance
with multiple small lesions, some of which are coarsely calcified,
presumably multifocal fibroids. Ovaries are unremarkable in
appearance.

Other: Small umbilical and supraumbilical ventral hernias containing
only omental fat. No significant volume of ascites. No
pneumoperitoneum.

Musculoskeletal: There are no aggressive appearing lytic or blastic
lesions noted in the visualized portions of the skeleton.
IMPRESSION: 1. No acute findings are noted in the abdomen or pelvis to account
for the patient's symptoms.
2. Small umbilical and supraumbilical ventral hernias containing
only omental fat. No associated bowel incarceration or obstruction
at this time.
3. Normal appendix.
4. Aortic atherosclerosis.
5. Fibroid uterus.
6. Additional incidental findings, as above.

## 2019-04-02 MED ORDER — SODIUM CHLORIDE 0.9 % IV SOLN: 100.0000 mL | INTRAVENOUS | Status: DC | PRN

## 2019-04-02 MED ORDER — SODIUM CHLORIDE 0.9 % IV SOLN: INTRAVENOUS | Status: DC

## 2019-04-02 MED ORDER — ALTEPLASE 2 MG IJ SOLR: 2.0000 mg | Freq: Once | INTRAMUSCULAR | Status: DC | PRN

## 2019-04-02 MED ORDER — ACETAMINOPHEN 325 MG PO TABS
650.0000 mg | ORAL_TABLET | Freq: Four times a day (QID) | ORAL | Status: DC | PRN
  Administered 2019-04-02 – 2019-04-03 (×3): 650 mg via ORAL
  Filled 2019-04-02 (×2): qty 2

## 2019-04-02 MED ORDER — LIDOCAINE HCL (PF) 1 % IJ SOLN: 5.0000 mL | INTRAMUSCULAR | Status: DC | PRN

## 2019-04-02 MED ORDER — FERROUS SULFATE 325 (65 FE) MG PO TABS: 325.0000 mg | ORAL_TABLET | Freq: Every day | ORAL | Status: DC

## 2019-04-02 MED ORDER — ONDANSETRON 4 MG PO TBDP
4.0000 mg | ORAL_TABLET | Freq: Once | ORAL | Status: AC
  Administered 2019-04-02: 4 mg via ORAL
  Filled 2019-04-02: qty 1

## 2019-04-02 MED ORDER — CHLORHEXIDINE GLUCONATE CLOTH 2 % EX PADS
6.0000 | MEDICATED_PAD | Freq: Every day | CUTANEOUS | Status: DC
  Administered 2019-04-03: 6 via TOPICAL

## 2019-04-02 MED ORDER — ONDANSETRON 4 MG PO TBDP: 4.0000 mg | ORAL_TABLET | Freq: Three times a day (TID) | ORAL | Status: DC | PRN

## 2019-04-02 MED ORDER — LIDOCAINE-PRILOCAINE 2.5-2.5 % EX CREA: 1.0000 "application " | TOPICAL_CREAM | CUTANEOUS | Status: DC | PRN

## 2019-04-02 MED ORDER — CLONIDINE HCL 0.2 MG PO TABS
0.2000 mg | ORAL_TABLET | Freq: Two times a day (BID) | ORAL | Status: DC
  Administered 2019-04-02 – 2019-04-04 (×3): 0.2 mg via ORAL
  Filled 2019-04-02 (×3): qty 1

## 2019-04-02 MED ORDER — MORPHINE SULFATE (PF) 4 MG/ML IV SOLN
4.0000 mg | Freq: Once | INTRAVENOUS | Status: AC
  Administered 2019-04-02: 4 mg via INTRAVENOUS
  Filled 2019-04-02: qty 1

## 2019-04-02 MED ORDER — ONDANSETRON HCL 4 MG/2ML IJ SOLN
4.0000 mg | Freq: Once | INTRAMUSCULAR | Status: AC
  Administered 2019-04-02: 4 mg via INTRAVENOUS
  Filled 2019-04-02: qty 2

## 2019-04-02 MED ORDER — ACETAMINOPHEN 650 MG RE SUPP: 650.0000 mg | Freq: Four times a day (QID) | RECTAL | Status: DC | PRN

## 2019-04-02 MED ORDER — HEPARIN SODIUM (PORCINE) 5000 UNIT/ML IJ SOLN
5000.0000 [IU] | Freq: Three times a day (TID) | INTRAMUSCULAR | Status: DC
  Administered 2019-04-02 – 2019-04-04 (×4): 5000 [IU] via SUBCUTANEOUS
  Filled 2019-04-02 (×4): qty 1

## 2019-04-02 MED ORDER — SODIUM CHLORIDE 0.9 % IV BOLUS: 250.0000 mL | Freq: Once | INTRAVENOUS | Status: DC

## 2019-04-02 MED ORDER — ATORVASTATIN CALCIUM 10 MG PO TABS
20.0000 mg | ORAL_TABLET | Freq: Every day | ORAL | Status: DC
  Administered 2019-04-03: 20 mg via ORAL
  Filled 2019-04-02: qty 2

## 2019-04-02 MED ORDER — INSULIN ASPART 100 UNIT/ML ~~LOC~~ SOLN
0.0000 [IU] | Freq: Three times a day (TID) | SUBCUTANEOUS | Status: DC
  Administered 2019-04-04: 12:00:00 1 [IU] via SUBCUTANEOUS

## 2019-04-02 MED ORDER — AMLODIPINE BESYLATE 5 MG PO TABS: 10.0000 mg | ORAL_TABLET | Freq: Every day | ORAL | Status: DC

## 2019-04-02 MED ORDER — ONDANSETRON HCL 4 MG PO TABS: 4.0000 mg | ORAL_TABLET | Freq: Three times a day (TID) | ORAL | Status: DC | PRN

## 2019-04-02 MED ORDER — PENTAFLUOROPROP-TETRAFLUOROETH EX AERO: 1.0000 "application " | INHALATION_SPRAY | CUTANEOUS | Status: DC | PRN

## 2019-04-02 MED ORDER — ONDANSETRON HCL 4 MG/2ML IJ SOLN: 4.0000 mg | Freq: Three times a day (TID) | INTRAMUSCULAR | Status: DC | PRN

## 2019-04-02 MED ORDER — INSULIN ASPART 100 UNIT/ML ~~LOC~~ SOLN
0.0000 [IU] | Freq: Every day | SUBCUTANEOUS | Status: DC
  Administered 2019-04-03: 2 [IU] via SUBCUTANEOUS

## 2019-04-02 MED ORDER — CARVEDILOL 25 MG PO TABS
25.0000 mg | ORAL_TABLET | Freq: Two times a day (BID) | ORAL | Status: DC
  Administered 2019-04-03 – 2019-04-04 (×2): 25 mg via ORAL
  Filled 2019-04-02 (×3): qty 1

## 2019-04-02 MED ORDER — HEPARIN SODIUM (PORCINE) 1000 UNIT/ML DIALYSIS: 1000.0000 [IU] | INTRAMUSCULAR | Status: DC | PRN

## 2019-04-02 MED ORDER — METOPROLOL TARTRATE 5 MG/5ML IV SOLN
5.0000 mg | Freq: Once | INTRAVENOUS | Status: AC
  Administered 2019-04-02: 13:00:00 5 mg via INTRAVENOUS
  Filled 2019-04-02: qty 5

## 2019-04-02 NOTE — ED Provider Notes (Signed)
[facility] EMERGENCY DEPARTMENT Provider Note   CSN: [id number] Arrival date & time: 04/02/19  1203     History   Chief Complaint Chief Complaint  Patient presents with  . Chest Pain    HPI [name] is a 58 y.o. female.     58 year old female with history of end-stage renal disease presents with 2 days of emesis with right lower quadrant abdominal pain.  No fever or chills.  Patient scheduled dialysis but missed it due to increased emesis.  Emesis has been green but without blood.  No diarrhea.  She endorses chest discomfort that only occurs when she has nausea or as emesis.  No anginal quality to it.  No treatment use prior to arrival.     Past Medical History:  Diagnosis Date  . Accelerated hypertension   . Diabetes mellitus   . GERD (gastroesophageal reflux disease)   . Hyperlipidemia   . Hypertension   . HYPERTENSION, BENIGN SYSTEMIC 07/03/2006        . Hypertensive emergency 02/05/2019  . Nausea   . Renal disorder     Patient Active Problem List   Diagnosis Date Noted  . Type 2 diabetes mellitus without complications ([place]) 89/38/1017  . Anemia in chronic kidney disease 02/19/2019  . End stage renal disease ([place]) 02/19/2019  . Secondary hyperparathyroidism of renal origin ([place]) 02/19/2019  . Coagulation defect, unspecified ([place]) 02/19/2019  . Venous (peripheral) insufficiency 08/06/2017  . Encounter for screening for respiratory tuberculosis 01/01/2016  . Essential hypertension   . 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 Diabetic gastroparesis ([place]) 08/09/2015  . Low back pain 06/09/2014  . Diarrhea, unspecified 01/18/2012  . Obstructive sleep apnea of adult 01/16/2012  . GERD (gastroesophageal reflux disease) 01/16/2012  . Osteoarthritis of both knees 01/16/2012  . Hyperlipidemia, unspecified 07/18/2011  . LEIOMYOMA, UTERUS 01/14/2007  . Morbid obesity ([place]) 07/03/2006  . Former smoker 07/03/2006  . Tension  headache 07/03/2006    Past Surgical History:  Procedure Laterality Date  . AV FISTULA PLACEMENT Left 02/17/2019   Procedure: ARTERIOVENOUS (AV) FISTULA CREATION LEFT UPPER ARM;  Surgeon: Waynetta Sandy, MD;  Location: [facility];  Service: Vascular;  Laterality: Left;  . BREAST SURGERY     reduction  . CESAREAN SECTION     x2  . CHOLECYSTECTOMY     laparoscopic  . IR FLUORO GUIDE CV LINE RIGHT  02/09/2019  . IR FLUORO GUIDE CV LINE RIGHT  03/05/2019  . IR US GUIDE VASC ACCESS RIGHT  02/09/2019  . REDUCTION MAMMAPLASTY Bilateral      OB History   No obstetric history on file.      Home Medications    Prior to Admission medications   Medication Sig Start Date End Date Taking? Authorizing Provider  Accu-Chek Softclix Lancets lancets Use as instructed 09/29/18   Diallo, Earna Coder, MD  acetaminophen (TYLENOL) 500 MG tablet Take 1,000 mg by mouth every 6 (six) hours as needed for moderate pain or headache.    [provider]  amLODipine (NORVASC) 10 MG tablet TAKE 1 TABLET BY MOUTH ONCE DAILY Patient taking differently: Take 10 mg by mouth daily.  01/06/18   Diallo, Earna Coder, MD  aspirin EC 325 MG tablet Take 325 mg by mouth daily.    [provider]  atorvastatin (LIPITOR) 20 MG tablet TAKE 1 TABLET BY MOUTH ONCE DAILY AT  6PM Patient taking differently: Take 20 mg by mouth daily at 6  PM.  09/16/17   [name], MD  B Complex-C-Folic Acid (DIALYVITE 440) 0.8 MG TABS Take 1 tablet by mouth daily. 03/02/19   [provider]  Blood Glucose Monitoring Suppl (ACCU-CHEK AVIVA PLUS) w/Device KIT Use to check sugar three times a day 05/14/17   [name], MD  carvedilol (COREG) 25 MG tablet Take 1 tablet (25 mg total) by mouth 2 (two) times daily with a meal. 07/01/18   Diallo, Abdoulaye, MD  cloNIDine (CATAPRES) 0.2 MG tablet Take 0.2 mg by mouth 2 (two) times daily.    [provider]  ferrous sulfate 325 (65 FE) MG tablet Take 325  mg by mouth daily with breakfast.    [provider]  furosemide (LASIX) 40 MG tablet Take 40 mg by mouth 2 (two) times daily.    [provider]  glucose blood (ACCU-CHEK AVIVA PLUS) test strip Use as instructed 09/29/18   Diallo, Abdoulaye, MD  ketorolac (ACULAR) 0.4 % SOLN Place 1 drop into the right eye 4 (four) times daily. 01/14/19   [provider]  Lancets (ACCU-CHEK SOFT TOUCH) lancets Use to check sugars three times a day 09/24/18   Diallo, Earna Coder, MD  polyethylene glycol (MIRALAX / GLYCOLAX) packet Take 17 g by mouth daily as needed for moderate constipation. 08/06/17   [name], MD  Potassium Chloride ER 20 MEQ TBCR Take 1 tablet by mouth daily. 02/24/19   [provider]  potassium chloride SA (K-DUR) 20 MEQ tablet Take 20 mEq by mouth daily.    [provider]  prednisoLONE acetate (PRED FORTE) 1 % ophthalmic suspension Place 1 drop into the right eye 4 (four) times daily. 01/14/19   [provider]  SYSTANE ULTRA 0.4-0.3 % SOLN Apply 1 drop to eye 4 (four) times daily. 01/14/19   [provider]  traMADol (ULTRAM) 50 MG tablet Take 1 tablet (50 mg total) by mouth every 6 (six) hours as needed. 02/17/19   Ulyses Amor, PA-C  Vitamin D, Ergocalciferol, (DRISDOL) 1.25 MG (50000 UT) CAPS capsule Take 50,000 Units by mouth every Monday.     [provider]    Family History Family History  Problem Relation Age of Onset  . Hypertension Mother   . Diabetes Mother   . Cancer Father        lung  . Colon cancer Neg Hx   . Stomach cancer Neg Hx   . Esophageal cancer Neg Hx     Social History Social History   Tobacco Use  . Smoking status: Former Smoker    Years: 10.00    Types: Cigarettes    Quit date: 10/16/2011    Years since quitting: 7.4  . Smokeless tobacco: Never Used  . Tobacco comment: 1 pack will last a week  Substance Use Topics  . Alcohol use: No    Alcohol/week: 0.0 standard drinks   . Drug use: No     Allergies   Lisinopril, Camellia, and Jardiance [empagliflozin]   Review of Systems Review of Systems  All other systems reviewed and are negative.    Physical Exam Updated Vital Signs BP (!) 178/97   Pulse (!) 123   Temp 97.8 F (36.6 C) (Oral)   Resp 14   Ht 1.626 m ('5\' 4"'$ )   Wt 113 kg   SpO2 99%   BMI 42.76 kg/m   Physical Exam Vitals signs and nursing note reviewed.  Constitutional:      General: She  is not in acute distress.    Appearance: Normal appearance. She is well-developed. She is not toxic-appearing.  HENT:     Head: Normocephalic and atraumatic.  Eyes:     General: Lids are normal.     Conjunctiva/sclera: Conjunctivae normal.     Pupils: Pupils are equal, round, and reactive to light.  Neck:     Musculoskeletal: Normal range of motion and neck supple.     Thyroid: No thyroid mass.     Trachea: No tracheal deviation.  Cardiovascular:     Rate and Rhythm: Regular rhythm. Tachycardia present.     Heart sounds: Normal heart sounds. No murmur. No gallop.   Pulmonary:     Effort: Pulmonary effort is normal. No respiratory distress.     Breath sounds: Normal breath sounds. No stridor. No decreased breath sounds, wheezing, rhonchi or rales.  Abdominal:     General: Bowel sounds are normal. There is no distension.     Palpations: Abdomen is soft.     Tenderness: There is no abdominal tenderness. There is no rebound.    Musculoskeletal: Normal range of motion.        General: No tenderness.  Skin:    General: Skin is warm and dry.     Findings: No abrasion or rash.  Neurological:     Mental Status: She is alert and oriented to person, place, and time.     GCS: GCS eye subscore is 4. GCS verbal subscore is 5. GCS motor subscore is 6.     Cranial Nerves: No cranial nerve deficit.     Sensory: No sensory deficit.  Psychiatric:        Speech: Speech normal.        Behavior: Behavior normal.      ED Treatments / Results  Labs  (all labs ordered are listed, but only abnormal results are displayed) Labs Reviewed  CBC WITH DIFFERENTIAL/PLATELET - Abnormal; Notable for the following components:      Result Value   WBC 20.4 (*)    RBC 5.27 (*)    Platelets 507 (*)    Neutro Abs 17.9 (*)    Abs Immature Granulocytes 0.09 (*)    All other components within normal limits  COMPREHENSIVE METABOLIC PANEL  LIPASE, BLOOD  TROPONIN I (HIGH SENSITIVITY)    EKG None  Radiology No results found.  Procedures Procedures (including critical care time)  Medications Ordered in ED Medications  0.9 %  sodium chloride infusion (has no administration in time range)  morphine 4 MG/ML injection 4 mg (has no administration in time range)  ondansetron (ZOFRAN) injection 4 mg (4 mg Intravenous Given 04/02/19 1253)  metoprolol tartrate (LOPRESSOR) injection 5 mg (5 mg Intravenous Given 04/02/19 1254)     Initial Impression / Assessment and Plan / ED Course  I have reviewed the triage vital signs and the nursing notes.  Pertinent labs & imaging results that were available during my care of the patient were reviewed by me and considered in my medical decision making (see chart for details).        Patient here with tachycardia and abdominal discomfort.  Leukocytosis noted on her CBC.  CT without acute findings.  Labs do show elevated lipase.  Patient has been able to keep down her medications for the last few days and was given metoprolol which did improve her heart rate.  Chest x-ray without acute findings.  First troponin was elevated at 154.  Likely demand ischemia.  Will admit for further work-up  CRITICAL CARE Performed by: Leota Jacobsen Total critical care time: 60 minutes Critical care time was exclusive of separately billable procedures and treating other patients. Critical care was necessary to treat or prevent imminent or life-threatening deterioration. Critical care was time spent personally by me on the  following activities: development of treatment plan with patient and/or surrogate as well as nursing, discussions with consultants, evaluation of patient's response to treatment, examination of patient, obtaining history from patient or surrogate, ordering and performing treatments and interventions, ordering and review of laboratory studies, ordering and review of radiographic studies, pulse oximetry and re-evaluation of patient's condition.   Final Clinical Impressions(s) / ED Diagnoses   Final diagnoses:  None    ED Discharge Orders    None       Lacretia Leigh, MD 04/02/19 1503

## 2019-04-02 NOTE — H&P (Addendum)
[facility] Admission History and Physical Service Pager: [phone]  Patient name: [name] Medical record number: [id number] Date of birth: [birth year] Age: 58 y.o. Gender: female  Primary Care Provider: Matilde Haymaker, MD Consultants: None  Code Status: Full Code Preferred Emergency Contact: Daughter [name] (goes by tamile) [phone]  Chief Complaint: Abdominal pain, nausea and vomiting  Assessment and Plan: [name] is a 58 y.o. female presenting with N/V and chest pain. PMH is significant for newly started on HD with ESRD, HTN, T2DM, HLD, HPpEF, chronic knee pain, and anemia.   Tachycardia  Atypical chest pain  Abdominal pain Patient presented to the ED with right lower quadrant abdominal pain that radiated to the epigastric and chest with associated nausea and vomiting for the past 2 days.  In the ED, patient tachycardic in the 130s and was given metoprolol IV '5mg'$  x1.  Patient takes clonidine however has not been able to keep her medication down the past 2 days. Initial troponin 154 up trended to 176,  most likely secondary to demand ischemia. Chest x-ray indicated mild cardiomegaly. EKG with sinus tach and borderline QT prolongation. CT ABD/Pelvis no acute findings identified. Lipase elevated at 133, however transaminases were not elevated.  Leukocytosis (WBC 20.4) with left shift. Pancreatis less likely though lipase elevated there was no peri-pancreatic fluid appreciated on CT. Choledocholithiasis less likely as patient had cholecystectomy and LFTs unremarkable.  However, patient with right upper quadrant tenderness on exam.  Patient denies alcohol use illicit drug use.  Denies changes in bowel and bladder habits, hematochezia, hematuria, melena.  Last ate on 03/31/19.  Will obtain RUQ [place] to assess for acute pathology.  Appendicitis not likely as sinusitis on CT was unremarkable. GERD possibly as patient reports chest pain following nausea and vomiting.   Consider single dose of GI cocktail.  Atypical chest pain with elevated troponins could be due to demand ischemia however ACS cannot be ruled out.  PE possibly as patient is tachycardic and tachypneic however denies shortness of breath. Wells score 1.5.  Patient states she hasn't eaten in 2 days however she is hyperglycemic. Glucose 220, with an elevated anion gap of 24 on admission in concerning for DKA. Patient s/p 1 bolus 250cc NS in the ED.  Will obtain UA to look for ketonuria, patient still makes urine.   -Admit to cardiac telemetry, attending Dr. Owens Shark  -Nephrology consulted -Follow RUQ ultrasound -Zofran as needed -Restart Coreg and Clonidine, continue to hold Norvasc -Vitals per routine -Up with assistance -Advance diet from clears to regular diet, as tolerated -AM CBC/RFP -Trend troponins  - AM EKG 11/27  Leukocytosis Leukocytosis with left shift on admission, WBC 20.4, Abs Neutro 17.9.  Patient meets SIRS criteria.  X-ray was unremarkable for infectious process.  Patient has temporary dialysis catheter on the right.  Urinalysis pending.  Patient denies dysuria. Regarding potential right upper quadrant acute pathology see above.  Blood cultures pending. Thrombocytosis 507. -Obtain dialysis catheter culture -Follow-up UA -Follow-up RUQ ultrasound -Follow blood cultures -AM CBC  ESRD Patient missed dialysis today due to her current illness.  Dialysis schedule is M/W/F.  Patient recently had LUE AV fistula created on 02/17/2019 by Dr. Donzetta Matters.  Home medications include Dialyvite 800, vitamin D 5000 units weekly.  Hemodialysis per nephrology.  -Nephrology following, appreciate recommendations -AM RFP  Anemia Patient has history of anemia, baseline about ~9.5. However, hemoglobin on admission is 14.1 likely hemoconcentrated. Home medications include ferrous sulfate 325 mg with breakfast. -  Hold home iron while patient continues to have nausea/vomiting -AM CBC  Hypertension Blood  pressure on admission 155/78, hypertensive. Patient has not had her home BP medications since Wednesday night. Home medications include 10 mg amlodipine, Coreg 25 mg twice daily, clonidine 0.2 mg twice daily -Monitor blood pressure -Continue home Coreg and clonidine, -Hold home amlodipine  T2DM Glucose on admission 220. Unknown ketonuria, urinalysis pending.  Last A1c on 03/10/2019 was 5.1.  Diabetes well controlled with diet; however A1c might be falsely low while on HD. -Monitor glucose with RFP -Sliding scale insulin -Follow-up UA  Chronic bilateral knee pain  Home medications include 50 mg tramadol every 6 hours as needed. -Hold home medication   Hyperlipidemia Home medications include aspirin 325 mg daily, atorvastatin 20 mg daily.  Last lipid panel on 11/12/2018: Cholesterol 181, TGs 95, HDL 55, LDL 107.  -Continue home atorvastatin -Hold home aspirin  HFpEF ECHO on 02/05/2019 indicated EF 60 to 65% with moderately increased LVH and impaired relaxation pattern of LV diastolic filling.  Home medications include Coreg 25 mg twice daily, Lasix 40 mg twice daily, potassium ER 20 mg daily. No signs of volume overload on exam. -Continue home medication  FEN/GI: Replete electrolytes as needed, advance to renal carb modified diet Prophylaxis: Heparin  Disposition: Admit to cardiac telemetry    History of Present Illness:  [name] is a 58 y.o. female presenting with persistent nausea that started 03/31/2019 and progressed to nonbloody, nonbilious vomiting the next day.  She was nauseated for the past 2 days with pain in her right lower quadrant, and then her chest started hurting. She feels better now. Denies fevers, chills, shaking, neck jaw pain, back pain, numbness, tingling, sore throat, congestion, rashes, headaches.  There have been no changes to bowel or bladder habits. She feels weak right now because she's not eating right now. She last ate on Wednesday 03/31/19 because she  started throwing up yesterday morning. Her sister thinks she was overdoing it because she had dialysis over the weekend and on Tuesday, and was "going and going a lot". She just started dialysis last month.  Follows with [facility].  Her last BM was yesterday 04/01/19. She makes urine, no blood in her urine. She feels much better after getting Zofran ODT.    Review Of Systems: Per HPI with the following additions:   Review of Systems  Constitutional: Negative for chills and fever.  HENT: Negative for congestion and sore throat.   Respiratory: Negative for cough, sputum production and shortness of breath.   Cardiovascular: Negative for chest pain.  Gastrointestinal: Positive for abdominal pain, nausea and vomiting. Negative for blood in stool and melena.  Genitourinary: Positive for flank pain. Negative for dysuria.  Musculoskeletal: Negative for back pain, joint pain and neck pain.  Skin: Negative for rash.  Neurological: Positive for weakness. Negative for focal weakness and headaches.  Psychiatric/Behavioral:       Difficulty sleeping with illness    Patient Active Problem List   Diagnosis Date Noted  . Type 2 diabetes mellitus without complications ([place]) 03/54/6568  . Anemia in chronic kidney disease 02/19/2019  . End stage renal disease ([place]) 02/19/2019  . Secondary hyperparathyroidism of renal origin ([place]) 02/19/2019  . Coagulation defect, unspecified ([place]) 02/19/2019  . Venous (peripheral) insufficiency 08/06/2017  . Encounter for screening for respiratory tuberculosis 01/01/2016  . Essential hypertension   . 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 Diabetic gastroparesis ([place]) 08/09/2015  .  Low back pain 06/09/2014  . Diarrhea, unspecified 01/18/2012  . Obstructive sleep apnea of adult 01/16/2012  . GERD (gastroesophageal reflux disease) 01/16/2012  . Osteoarthritis of both knees 01/16/2012  . Hyperlipidemia, unspecified 07/18/2011  . LEIOMYOMA,  UTERUS 01/14/2007  . Morbid obesity ([place]) 07/03/2006  . Former smoker 07/03/2006  . Tension headache 07/03/2006    Past Medical History: Past Medical History:  Diagnosis Date  . Accelerated hypertension   . Diabetes mellitus   . GERD (gastroesophageal reflux disease)   . Hyperlipidemia   . Hypertension   . HYPERTENSION, BENIGN SYSTEMIC 07/03/2006        . Hypertensive emergency 02/05/2019  . Nausea   . Renal disorder     Past Surgical History: Past Surgical History:  Procedure Laterality Date  . AV FISTULA PLACEMENT Left 02/17/2019   Procedure: ARTERIOVENOUS (AV) FISTULA CREATION LEFT UPPER ARM;  Surgeon: Waynetta Sandy, MD;  Location: [facility];  Service: Vascular;  Laterality: Left;  . BREAST SURGERY     reduction  . CESAREAN SECTION     x2  . CHOLECYSTECTOMY     laparoscopic  . IR FLUORO GUIDE CV LINE RIGHT  02/09/2019  . IR FLUORO GUIDE CV LINE RIGHT  03/05/2019  . IR US GUIDE VASC ACCESS RIGHT  02/09/2019  . REDUCTION MAMMAPLASTY Bilateral     Social History: Social History   Tobacco Use  . Smoking status: Former Smoker    Years: 10.00    Types: Cigarettes    Quit date: 10/16/2011    Years since quitting: 7.4  . Smokeless tobacco: Never Used  . Tobacco comment: 1 pack will last a week  Substance Use Topics  . Alcohol use: No    Alcohol/week: 0.0 standard drinks  . Drug use: No   Additional social history:   Please also refer to relevant sections of EMR.  Family History: Family History  Problem Relation Age of Onset  . Hypertension Mother   . Diabetes Mother   . Cancer Father        lung  . Colon cancer Neg Hx   . Stomach cancer Neg Hx   . Esophageal cancer Neg Hx     Allergies and Medications: Allergies  Allergen Reactions  . Lisinopril Anaphylaxis and Other (See Comments)    angioedema  . Camellia Other (See Comments)  . Jardiance [Empagliflozin] Rash   No current facility-administered medications on file prior to encounter.     Current Outpatient Medications on File Prior to Encounter  Medication Sig Dispense Refill  . Accu-Chek Softclix Lancets lancets Use as instructed 100 each 12  . acetaminophen (TYLENOL) 500 MG tablet Take 1,000 mg by mouth every 6 (six) hours as needed for moderate pain or headache.    Marland Kitchen amLODipine (NORVASC) 10 MG tablet TAKE 1 TABLET BY MOUTH ONCE DAILY (Patient taking differently: Take 10 mg by mouth daily. ) 30 tablet 0  . aspirin EC 325 MG tablet Take 325 mg by mouth daily.    Marland Kitchen atorvastatin (LIPITOR) 20 MG tablet TAKE 1 TABLET BY MOUTH ONCE DAILY AT  6PM (Patient taking differently: Take 20 mg by mouth daily at 6 PM. ) 90 tablet 2  . B Complex-C-Folic Acid (DIALYVITE 789) 0.8 MG TABS Take 1 tablet by mouth daily.    . Blood Glucose Monitoring Suppl (ACCU-CHEK AVIVA PLUS) w/Device KIT Use to check sugar three times a day 1 kit 0  . carvedilol (COREG) 25 MG tablet Take 1 tablet (25 mg  total) by mouth 2 (two) times daily with a meal. 180 tablet 1  . cloNIDine (CATAPRES) 0.2 MG tablet Take 0.2 mg by mouth 2 (two) times daily.    . ferrous sulfate 325 (65 FE) MG tablet Take 325 mg by mouth daily with breakfast.    . furosemide (LASIX) 40 MG tablet Take 40 mg by mouth 2 (two) times daily.    Marland Kitchen glucose blood (ACCU-CHEK AVIVA PLUS) test strip Use as instructed 100 each 12  . ketorolac (ACULAR) 0.4 % SOLN Place 1 drop into the right eye 4 (four) times daily.    . Lancets (ACCU-CHEK SOFT TOUCH) lancets Use to check sugars three times a day 100 each 12  . polyethylene glycol (MIRALAX / GLYCOLAX) packet Take 17 g by mouth daily as needed for moderate constipation. 30 each 0  . Potassium Chloride ER 20 MEQ TBCR Take 1 tablet by mouth daily.    . potassium chloride SA (K-DUR) 20 MEQ tablet Take 20 mEq by mouth daily.    . prednisoLONE acetate (PRED FORTE) 1 % ophthalmic suspension Place 1 drop into the right eye 4 (four) times daily.    . SYSTANE ULTRA 0.4-0.3 % SOLN Apply 1 drop to eye 4 (four) times  daily.    . traMADol (ULTRAM) 50 MG tablet Take 1 tablet (50 mg total) by mouth every 6 (six) hours as needed. 10 tablet 0  . Vitamin D, Ergocalciferol, (DRISDOL) 1.25 MG (50000 UT) CAPS capsule Take 50,000 Units by mouth every Monday.       Objective: BP (!) 155/78 (BP Location: Right Arm)   Pulse (!) 118   Temp 97.8 F (36.6 C) (Oral)   Resp (!) 22   Ht '5\' 4"'$  (1.626 m)   Wt 113 kg   SpO2 100%   BMI 42.76 kg/m  Exam:  GEN:     alert, drowsy (s/p Morphine and Zofran) and no acute distress    HENT:  mucus membranes moist, oropharyngeal without lesions or erythema,  nares patent, no nasal discharge  EYES:   pupils equal and reactive, EOM intact NECK:  supple, normal ROM RESP:  clear to auscultation bilaterally, no increased work of breathing  CVS:   regular rate and rhythm, no murmur, distal pulses intact, temp dialysis catheter on right, LUE fistula  ABD:  soft, tender to palpation RUQ and RLQ tenderness, no CVA tenderness, non-distended, bowel sounds present EXT:   normal ROM, atraumatic, no lower extremity edema  NEURO:  normal without focal findings,  speech normal, drowsy and oriented Skin:   warm and dry, no rash, normal skin turgor Psych: Normal affect and thought content   Labs and Imaging: CBC BMET  Recent Labs  Lab 04/02/19 1223  WBC 20.4*  HGB 14.1  HCT 45.6  PLT 507*   Recent Labs  Lab 04/02/19 1223  NA 141  K 4.2  CL 98  CO2 19*  BUN 63*  CREATININE 8.51*  GLUCOSE 220*  CALCIUM 10.3     EKG: Sinus tachycardia, heart rate 127, LVH with borderline QT prolongation  Ct Abdomen Pelvis Wo Contrast  Result Date: 04/02/2019 CLINICAL DATA:  58 year old female with history of abdominal distension. Central chest pain. Vomiting for the past 2 days. EXAM: CT ABDOMEN AND PELVIS WITHOUT CONTRAST TECHNIQUE: Multidetector CT imaging of the abdomen and pelvis was performed following the standard protocol without IV contrast. COMPARISON:  CT the abdomen and pelvis  02/05/2019. FINDINGS: Lower chest: Unremarkable. Hepatobiliary: No definite suspicious cystic  or solid hepatic lesions are confidently identified on today's noncontrast CT examination. Status post cholecystectomy. Pancreas: No definite pancreatic mass or peripancreatic fluid collections or inflammatory changes noted on today's noncontrast CT examination. Spleen: Unremarkable. Adrenals/Urinary Tract: No calcifications are identified within the collecting system of either kidneys, along the course of either ureter, or within the lumen of the urinary bladder. No hydroureteronephrosis. Low-attenuation lesions in the right kidney measuring up to 1.8 cm in the interpolar region, incompletely characterized on today's non-contrast CT examination, but similar to the prior study and statistically likely to represent cysts. Previously noted gas and high attenuation in the interpolar region of the left kidney has completely resolved (related to remote biopsy). Small amount of bilateral perinephric soft tissue stranding (nonspecific). Urinary bladder is normal in appearance. Bilateral adrenal glands are normal in appearance. Stomach/Bowel: Normal appearance of the stomach. No pathologic dilatation of small bowel or colon. Numerous colonic diverticulae are noted, without surrounding inflammatory changes to suggest an acute diverticulitis at this time. Normal appendix. Vascular/Lymphatic: Aortic atherosclerosis. No lymphadenopathy noted in the abdomen or pelvis. Reproductive: Uterus is enlarged and heterogeneous in appearance with multiple small lesions, some of which are coarsely calcified, presumably multifocal fibroids. Ovaries are unremarkable in appearance. Other: Small umbilical and supraumbilical ventral hernias containing only omental fat. No significant volume of ascites. No pneumoperitoneum. Musculoskeletal: There are no aggressive appearing lytic or blastic lesions noted in the visualized portions of the skeleton.  IMPRESSION: 1. No acute findings are noted in the abdomen or pelvis to account for the patient's symptoms. 2. Small umbilical and supraumbilical ventral hernias containing only omental fat. No associated bowel incarceration or obstruction at this time. 3. Normal appendix. 4. Aortic atherosclerosis. 5. Fibroid uterus. 6. Additional incidental findings, as above. Electronically Signed   By: Vinnie Langton M.D.   On: 04/02/2019 13:42   Dg Chest Port 1 View  Result Date: 04/02/2019 CLINICAL DATA:  Shortness of breath. Central chest pain. Vomiting. EXAM: PORTABLE CHEST 1 VIEW COMPARISON:  Chest radiograph 02/05/2019 FINDINGS: Right internal jugular dialysis catheter tip in the mid-upper SVC. Mild cardiomegaly not significantly changed from prior. Unchanged mediastinal contours. No pulmonary edema, focal airspace disease, pleural effusion or pneumothorax. No acute osseous abnormalities are seen. IMPRESSION: 1. Unchanged mild cardiomegaly. 2. Right internal jugular dialysis catheter tip in the mid-upper SVC. No other acute findings or change from prior exam. Electronically Signed   By: Keith Rake M.D.   On: 04/02/2019 13:08     Lyndee Hensen, MD 04/02/2019, 3:32 PM PGY-1, [place] Intern pager: [phone], text pages welcome   FPTS Upper-Level Resident Addendum I have independently interviewed and examined the patient. I have discussed the above with the original author and agree with their documentation. My edits for correction/addition/clarification are in blue. Please see also any attending notes.    Milus Banister, DO PGY-2, [facility] 04/02/2019 8:52 PM  [place] Service pager: [phone] (text pages welcome through [place])

## 2019-04-02 NOTE — ED Triage Notes (Addendum)
Pt from home with reports of central chest pain started this morning.  Radiates to left neck Vomiting X 2dyas Denies diarrhea

## 2019-04-02 NOTE — Progress Notes (Signed)
   FMTS Attending Brief Note: Carolyn Singh, MD  Personal pager:  [phone] [place] Service Pager:  [phone]   Patient seen at 1630. Will sign resident note as available.   58 year old woman with history of ESRD, cholecystectomy, HTN, and dyslipidemia presenting with intractable nausea and vomiting. This onset two days ago, progressed to developing abdominal pain in RUQ and chest pain, particularly with emesis. Emesis multiple times on 11/26 and today. No headache, dyspnea, fevers, chills, line drainage, diarrhea, melena, hematochezia, constipation, or dysuria. Pain is throughout right side of abdomen. Reports no missed HD days, no new medications, no new foods, no new sick contacts.   PMH/PSH/Social history reviewed.  Vitals notable for tachycardia, hypertension. Labs reviewed, marked leukocytosis, elevated lipase, anion gap acidosis, elevated troponin. EKG shows sinus tachycardia with T wave changes and LVH, similar to October EKG. Chest x-ray and CT reviewed.    Exam notable for ill appearing woman, no distress. Tachycardia with PMI displace. No murmurs. Lungs coarse. Abdomen with diminished bowel sounds, tenderness but no rebound in RUQ and RLQ. Trace edema. No vesicular rashes. Prior mastectomy scars present. Tunneled line in right chest wall.   1. Intractable nausea and vomiting, unclear etiology. Differential includes acute gastroetneritis, bacteremia. Less likely pancreatitis (lipase mildly elevated, no imaging findings), hepatitis (normal LFT). Must also consider atypical presentation of MI however troponin mildly elevated and chest pain onset after multiple episodes of emesis. Less likely uremia as has not missed HD. Blood glucose not in range for DKA as cause.  2. Metabolic acidosis, likely due to renal disease and intravascular depletion due to multiple episodes of emesis without PO intake. Will give 250 cc over several hours and stop. HD in AM.  3. Type II NSTEMI, likely due to demand  ischemia in setting of multiple episodes of emesis. 4. Leukocytosis, will evaluate for bacteremia or other infection as below. Monitor. If febrile, hypotensive, or tachycardia does not improve, consider vancomycin and cefepime.  5. Tachycardia, due to dehydration and medication withdrawal most likely.

## 2019-04-03 ENCOUNTER — Encounter (HOSPITAL_COMMUNITY): Payer: Self-pay

## 2019-04-03 ENCOUNTER — Observation Stay (HOSPITAL_COMMUNITY): Payer: BC Managed Care – PPO

## 2019-04-03 ENCOUNTER — Other Ambulatory Visit (HOSPITAL_COMMUNITY): Payer: BC Managed Care – PPO

## 2019-04-03 DIAGNOSIS — N186 End stage renal disease: Secondary | ICD-10-CM | POA: Diagnosis not present

## 2019-04-03 DIAGNOSIS — I132 Hypertensive heart and chronic kidney disease with heart failure and with stage 5 chronic kidney disease, or end stage renal disease: Secondary | ICD-10-CM | POA: Diagnosis not present

## 2019-04-03 DIAGNOSIS — A084 Viral intestinal infection, unspecified: Secondary | ICD-10-CM | POA: Diagnosis not present

## 2019-04-03 DIAGNOSIS — R000 Tachycardia, unspecified: Secondary | ICD-10-CM

## 2019-04-03 DIAGNOSIS — R1011 Right upper quadrant pain: Secondary | ICD-10-CM | POA: Diagnosis not present

## 2019-04-03 DIAGNOSIS — E1122 Type 2 diabetes mellitus with diabetic chronic kidney disease: Secondary | ICD-10-CM | POA: Diagnosis not present

## 2019-04-03 DIAGNOSIS — R0789 Other chest pain: Secondary | ICD-10-CM

## 2019-04-03 LAB — CBC
HCT: 38 % (ref 36.0–46.0)
Hemoglobin: 11.9 g/dL — ABNORMAL LOW (ref 12.0–15.0)
MCH: 26.8 pg (ref 26.0–34.0)
MCHC: 31.3 g/dL (ref 30.0–36.0)
MCV: 85.6 fL (ref 80.0–100.0)
Platelets: 387 10*3/uL (ref 150–400)
RBC: 4.44 MIL/uL (ref 3.87–5.11)
RDW: 14.7 % (ref 11.5–15.5)
WBC: 15.5 10*3/uL — ABNORMAL HIGH (ref 4.0–10.5)
nRBC: 0 % (ref 0.0–0.2)

## 2019-04-03 LAB — RENAL FUNCTION PANEL
Albumin: 3.5 g/dL (ref 3.5–5.0)
Anion gap: 22 — ABNORMAL HIGH (ref 5–15)
BUN: 80 mg/dL — ABNORMAL HIGH (ref 6–20)
CO2: 21 mmol/L — ABNORMAL LOW (ref 22–32)
Calcium: 9.7 mg/dL (ref 8.9–10.3)
Chloride: 98 mmol/L (ref 98–111)
Creatinine, Ser: 9.92 mg/dL — ABNORMAL HIGH (ref 0.44–1.00)
GFR calc Af Amer: 4 mL/min — ABNORMAL LOW (ref >60)
GFR calc non Af Amer: 4 mL/min — ABNORMAL LOW (ref >60)
Glucose, Bld: 117 mg/dL — ABNORMAL HIGH (ref 70–99)
Phosphorus: 10.4 mg/dL — ABNORMAL HIGH (ref 2.5–4.6)
Potassium: 4.9 mmol/L (ref 3.5–5.1)
Sodium: 141 mmol/L (ref 135–145)

## 2019-04-03 LAB — GLUCOSE, CAPILLARY
Glucose-Capillary: 117 mg/dL — ABNORMAL HIGH (ref 70–99)
Glucose-Capillary: 196 mg/dL — ABNORMAL HIGH (ref 70–99)
Glucose-Capillary: 99 mg/dL (ref 70–99)
Glucose-Capillary: 223 mg/dL — ABNORMAL HIGH (ref 70–99)

## 2019-04-03 IMAGING — US US ABDOMEN LIMITED
1 series · 14 of 25 positions shown · non-contrast
Comparison: CT abdomen and pelvis [DATE]. Renal ultrasound
[DATE].

CLINICAL DATA: Right upper quadrant pain for 1 day. Previous
cholecystectomy.

EXAM:
ULTRASOUND ABDOMEN LIMITED RIGHT UPPER QUADRANT

[Series 1: us abdomen limited · 14 of 38 slices shown]
[im 1/38]
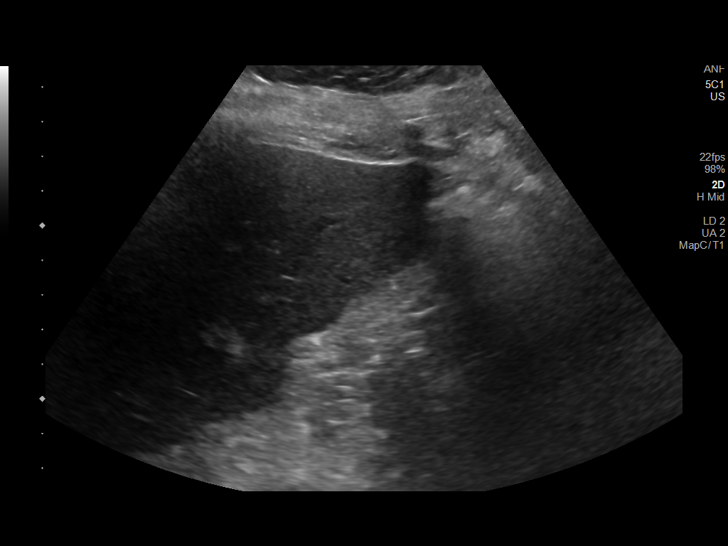
[im 4/38]
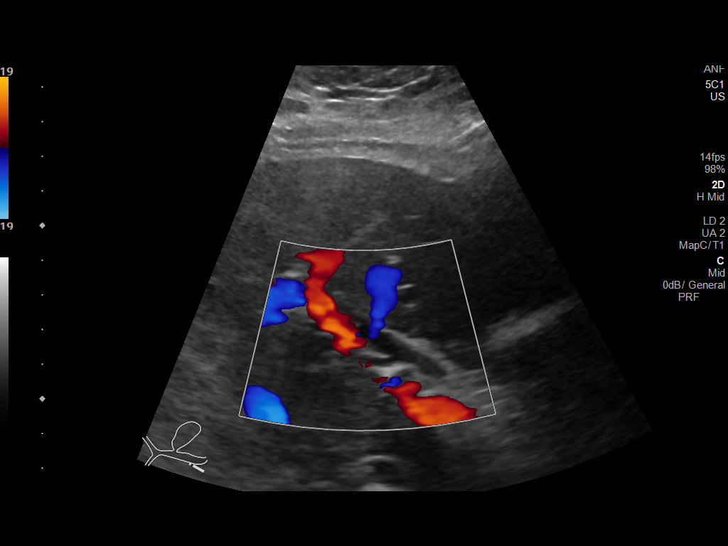
[im 7/38]
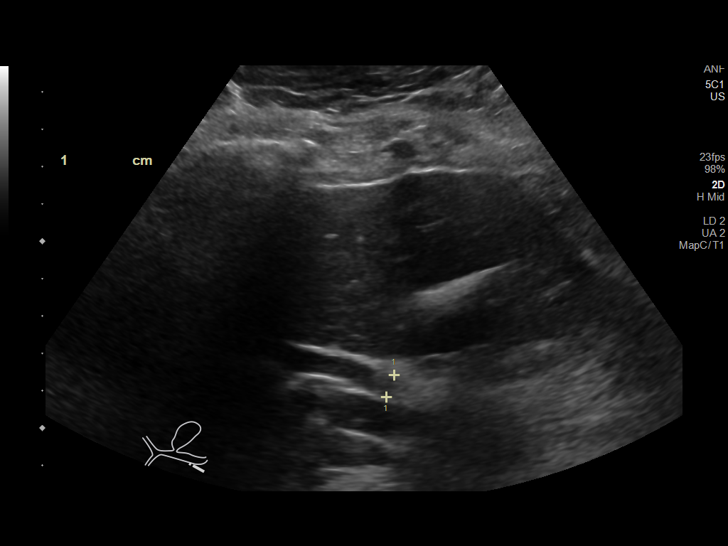
[im 10/38]
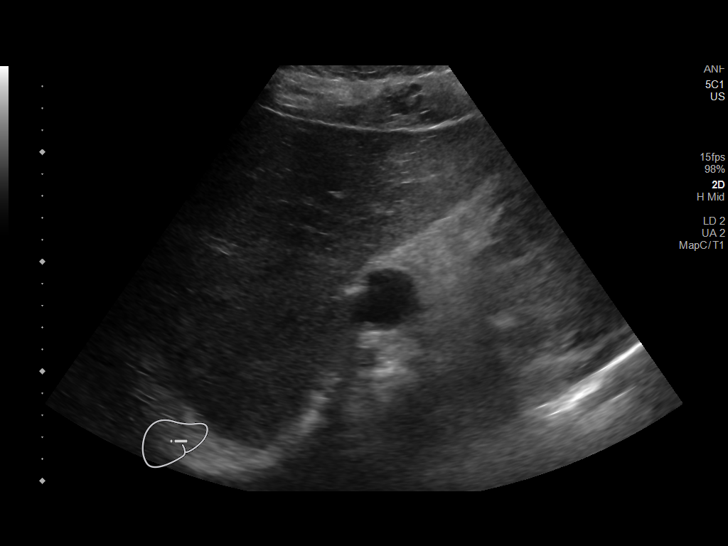
[im 13/38]
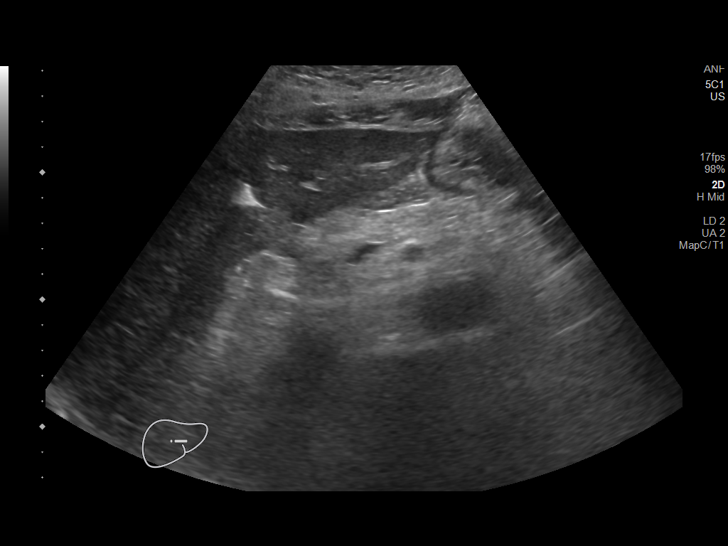
[im 14/38]
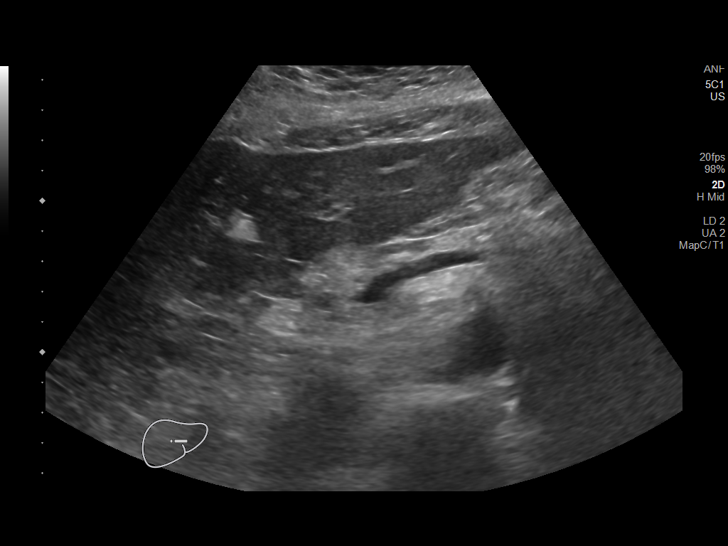
[im 17/38]
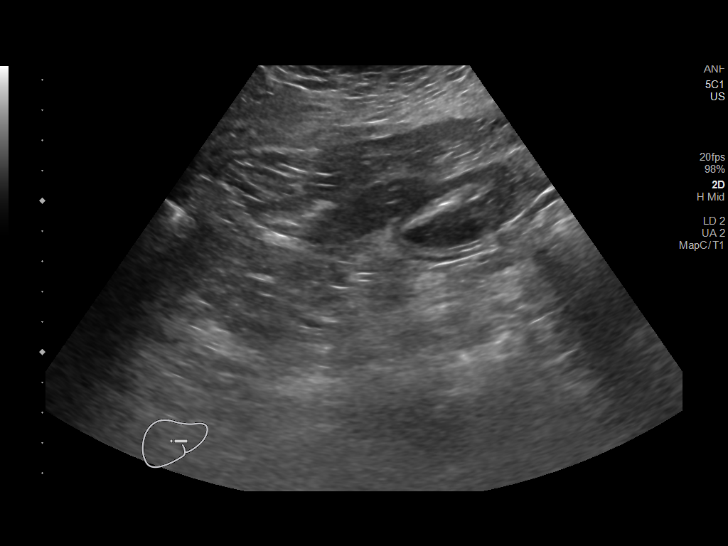
[im 21/38]
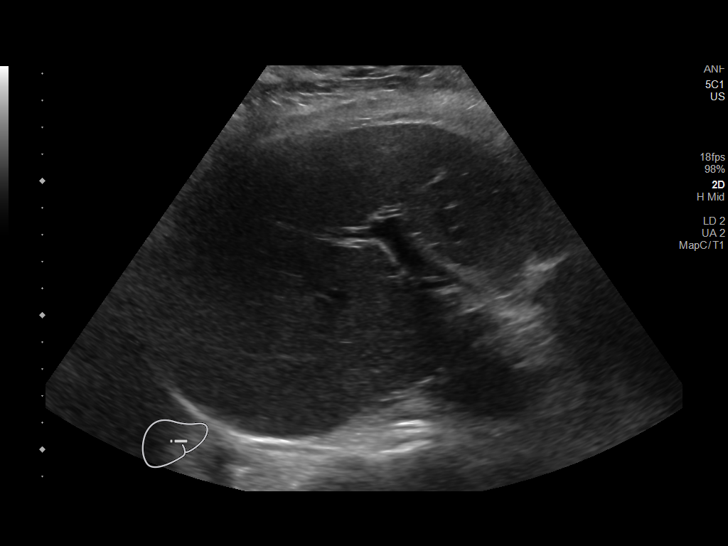
[im 24/38]
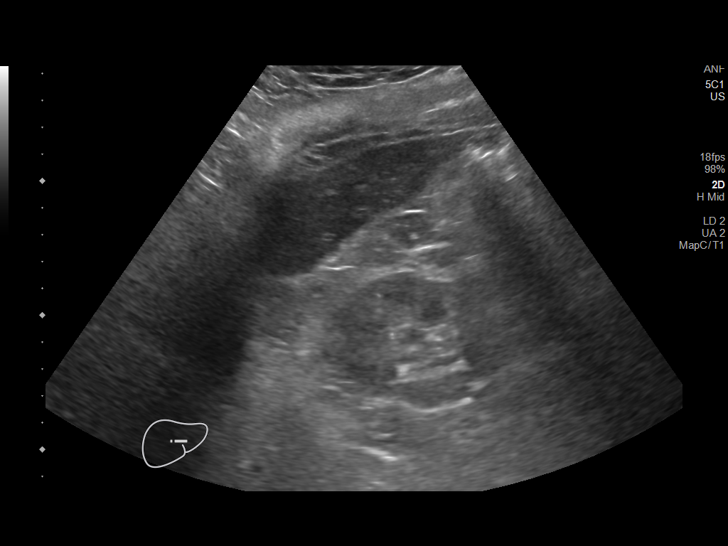
[im 25/38]
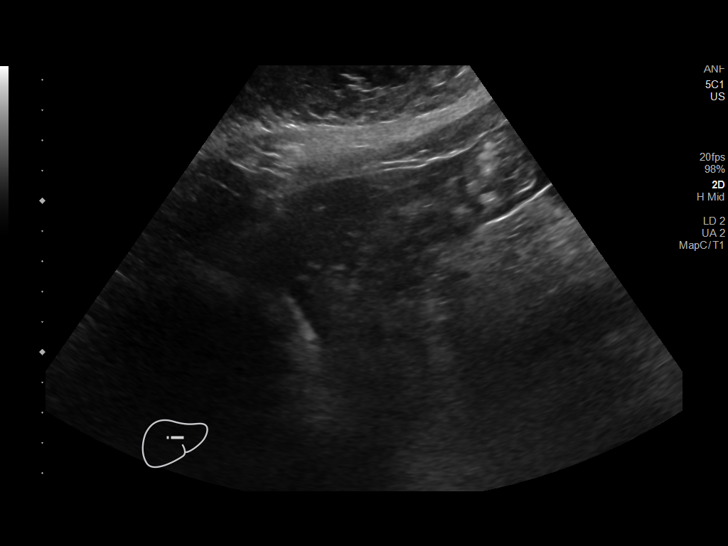
[im 28/38]
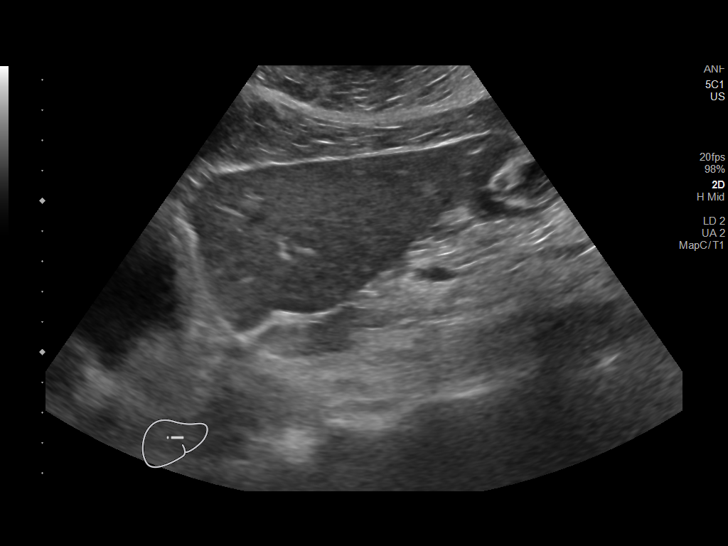
[im 31/38]
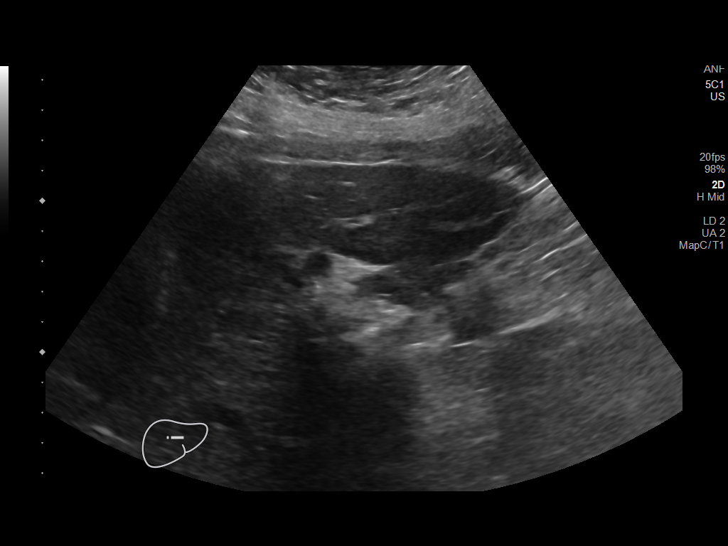
[im 34/38]
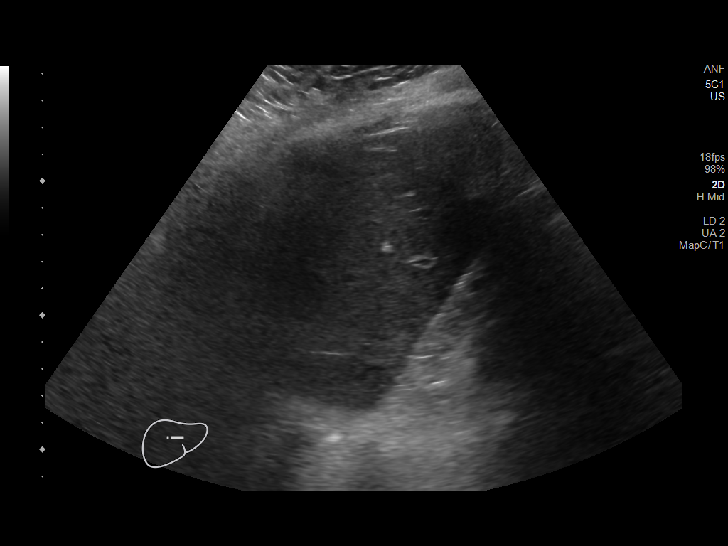
[im 38/38]
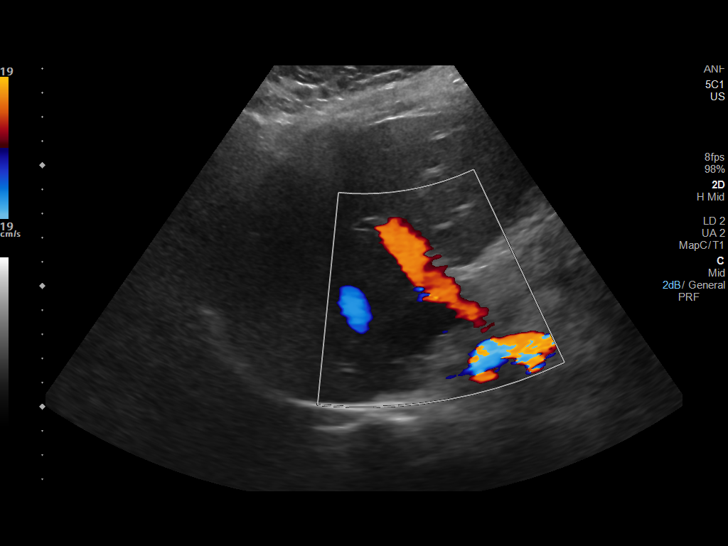

[14 of 25 positions shown; findings below may reference images not displayed]

FINDINGS: Gallbladder:

Surgically absent.

Common bile duct:

Diameter: 4 mm

Liver:

No focal lesion identified. Within normal limits in parenchymal
echogenicity. Portal vein is patent on color Doppler imaging with
normal direction of blood flow towards the liver.

Other: Increased right renal parenchymal echogenicity, also present
on the prior renal ultrasound.
IMPRESSION: 1. Status post cholecystectomy.  No biliary dilatation.
2. Unremarkable appearance of the liver.
3. Echogenic right kidney compatible with medical renal disease.

## 2019-04-03 MED ORDER — FERROUS SULFATE 325 (65 FE) MG PO TABS
325.0000 mg | ORAL_TABLET | Freq: Every day | ORAL | Status: DC
  Administered 2019-04-04: 325 mg via ORAL
  Filled 2019-04-03: qty 1

## 2019-04-03 MED ORDER — ASPIRIN EC 325 MG PO TBEC
325.0000 mg | DELAYED_RELEASE_TABLET | Freq: Every day | ORAL | Status: DC
  Administered 2019-04-04: 325 mg via ORAL
  Filled 2019-04-03: qty 1

## 2019-04-03 MED ORDER — HEPARIN SODIUM (PORCINE) 1000 UNIT/ML IJ SOLN
INTRAMUSCULAR | Status: AC
  Filled 2019-04-03: qty 4

## 2019-04-03 MED ORDER — ACETAMINOPHEN 325 MG PO TABS
ORAL_TABLET | ORAL | Status: AC
  Filled 2019-04-03: qty 2

## 2019-04-03 NOTE — Progress Notes (Addendum)
Family Medicine Teaching Service Daily Progress Note Intern Pager: [phone]  Patient name: [name] Medical record number: [id number] Date of birth: 1960-06-05 Age: 58 y.o. Gender: female  Primary Care Provider: Matilde Haymaker, MD Consultants: Nephrology Code Status: full  Pt Overview and Major Events to Date:  11/27 admitted to family medicine teaching service  Assessment and Plan: 58 year old female presenting with intractable nausea and vomiting, along with right upper quadrant abdominal pain.  Nausea/vomiting/abdominal pain Feels much better this a.m.  Able to tolerate some juice and popsicle.  States her abdominal pain has resolved.  We will continue to monitor and patient can advance her diet as tolerated.  Can continue take Zofran 4 mg every 8 hours as needed for nausea.  Still unclear etiology, with trend towards possible pancreatitis given elevated lipase.  Also consider GI virus, although does not have the accompanying diarrhea.  Right upper quadrant ultrasound negative -Monitor abdominal pain -Advance from clear liquids to renal/carb modified as tolerated -Continue Zofran 4 mg every 8 hours -Monitor fluid status  End-stage renal disease Nephrology following, appreciate recommendations.  Likely get dialysis today.  K4.9, creatinine 10, BUN 80. -Dialysis per nephro -Daily renal function panel  Hypertension/tachycardia Both issues have improved with patient's home Coreg.  She has been able to keep it down while admitted, last heart rate 93, last BP 149/58.  Could consider adding on amlodipine if patient continues to have hypertension.  Will monitor heart rate.  Patient does have blood cultures pending, will monitor for at least 24 hours.  Leukocytosis Likely secondary to dehydration, possible GI related inflammation, and ESRD status.  Has improved to 15.5 from 20.4.  We will continue to monitor with CBC while admitted.  Elevated troponins Ranging between 176 and 148.   Could potentially represent her baseline in setting of ESRD and dehydration.  No chest pain, will not continue to trend.  Could consider restarting if has chest pain.  Type 2 diabetes Last A1c 5.1.  Glucose 220 on admission, last to be checked 117.  On sliding scale insulin.  Chronic bilateral knee pain  Home medications include 50 mg tramadol every 6 hours as needed. -Hold home medication   Hyperlipidemia Home medications include aspirin 325 mg daily, atorvastatin 20 mg daily.  Last lipid panel on 11/12/2018: Cholesterol 181, TGs 95, HDL 55, LDL 107.  -Continue home atorvastatin -Hold home aspirin  HFpEF ECHO on 02/05/2019 indicated EF 60 to 65% with moderately increased LVH and impaired relaxation pattern of LV diastolic filling.  Home medications include Coreg 25 mg twice daily, Lasix 40 mg twice daily, potassium ER 20 mg daily. No signs of volume overload on exam. -Continue home medication   FEN/GI: Liquid diet, advance as tolerated PPx: Heparin 5000 units, 3 times daily  Disposition: Likely home pending clinical course  Subjective:  Doing much better this morning.  Able to tolerate some juice, popsicle.  Abdominal pain is resolved.  Objective: Temp:  [97.6 F (36.4 C)-98.2 F (36.8 C)] 97.8 F (36.6 C) (11/28 0736) Pulse Rate:  [93-125] 93 (11/28 0736) Resp:  [14-30] 20 (11/28 0736) BP: (105-197)/(58-101) 149/58 (11/28 0736) SpO2:  [98 %-100 %] 100 % (11/28 0736) Weight:  [106.3 kg-113 kg] 106.3 kg (11/28 0016) Physical Exam: General: Very pleasant 58 year old African-American female, no acute stress, resting comfortably Cardiovascular: Regular rate and rhythm, no M/R/G.  Skin warm and dry.  Thrill noted in left upper extremity BC fistula. Respiratory: Lungs clear to auscultate bilaterally, no acute distress Abdomen: Soft,  nontender, nondistended. Extremities: 5 out of 5 strength bilateral upper extremity, bilateral lower extremity  Laboratory: Recent Labs  Lab  04/02/19 1223 04/03/19 0420  WBC 20.4* 15.5*  HGB 14.1 11.9*  HCT 45.6 38.0  PLT 507* 387   Recent Labs  Lab 04/02/19 1223 04/03/19 0420  NA 141 141  K 4.2 4.9  CL 98 98  CO2 19* 21*  BUN 63* 80*  CREATININE 8.51* 9.92*  CALCIUM 10.3 9.7  PROT 9.0*  --   BILITOT 0.8  --   ALKPHOS 110  --   ALT 15  --   AST 23  --   GLUCOSE 220* 117*      Imaging/Diagnostic Tests: CLINICAL DATA:  Right upper quadrant pain for 1 day. Previous cholecystectomy.  EXAM: ULTRASOUND ABDOMEN LIMITED RIGHT UPPER QUADRANT  COMPARISON:  CT abdomen and pelvis 04/02/2019. Renal ultrasound 02/08/2019.  FINDINGS: Gallbladder:  Surgically absent.  Common bile duct:  Diameter: 4 mm  Liver:  No focal lesion identified. Within normal limits in parenchymal echogenicity. Portal vein is patent on color Doppler imaging with normal direction of blood flow towards the liver.  Other: Increased right renal parenchymal echogenicity, also present on the prior renal ultrasound.  IMPRESSION: 1. Status post cholecystectomy.  No biliary dilatation. 2. Unremarkable appearance of the liver. 3. Echogenic right kidney compatible with medical renal disease.  Guadalupe Dawn, MD 04/03/2019, 10:34 AM PGY-3, [place] Intern pager: [phone], text pages welcome

## 2019-04-04 DIAGNOSIS — R000 Tachycardia, unspecified: Secondary | ICD-10-CM | POA: Diagnosis not present

## 2019-04-04 DIAGNOSIS — R1011 Right upper quadrant pain: Secondary | ICD-10-CM | POA: Diagnosis not present

## 2019-04-04 DIAGNOSIS — I5032 Chronic diastolic (congestive) heart failure: Secondary | ICD-10-CM | POA: Diagnosis not present

## 2019-04-04 DIAGNOSIS — R0789 Other chest pain: Secondary | ICD-10-CM | POA: Diagnosis not present

## 2019-04-04 LAB — CBC
HCT: 37.1 % (ref 36.0–46.0)
Hemoglobin: 11.4 g/dL — ABNORMAL LOW (ref 12.0–15.0)
MCH: 26.7 pg (ref 26.0–34.0)
MCHC: 30.7 g/dL (ref 30.0–36.0)
MCV: 86.9 fL (ref 80.0–100.0)
Platelets: 349 10*3/uL (ref 150–400)
RBC: 4.27 MIL/uL (ref 3.87–5.11)
RDW: 14.6 % (ref 11.5–15.5)
WBC: 9.6 10*3/uL (ref 4.0–10.5)
nRBC: 0 % (ref 0.0–0.2)

## 2019-04-04 LAB — GLUCOSE, CAPILLARY
Glucose-Capillary: 129 mg/dL — ABNORMAL HIGH (ref 70–99)
Glucose-Capillary: 177 mg/dL — ABNORMAL HIGH (ref 70–99)

## 2019-04-04 LAB — RENAL FUNCTION PANEL
Albumin: 3.6 g/dL (ref 3.5–5.0)
Anion gap: 18 — ABNORMAL HIGH (ref 5–15)
BUN: 39 mg/dL — ABNORMAL HIGH (ref 6–20)
CO2: 27 mmol/L (ref 22–32)
Calcium: 9.7 mg/dL (ref 8.9–10.3)
Chloride: 93 mmol/L — ABNORMAL LOW (ref 98–111)
Creatinine, Ser: 7.15 mg/dL — ABNORMAL HIGH (ref 0.44–1.00)
GFR calc Af Amer: 7 mL/min — ABNORMAL LOW (ref >60)
GFR calc non Af Amer: 6 mL/min — ABNORMAL LOW (ref >60)
Glucose, Bld: 126 mg/dL — ABNORMAL HIGH (ref 70–99)
Phosphorus: 6.8 mg/dL — ABNORMAL HIGH (ref 2.5–4.6)
Potassium: 4.5 mmol/L (ref 3.5–5.1)
Sodium: 138 mmol/L (ref 135–145)

## 2019-04-04 MED ORDER — FERRIC CITRATE 1 GM 210 MG(FE) PO TABS
210.0000 mg | ORAL_TABLET | Freq: Three times a day (TID) | ORAL | Status: DC
  Filled 2019-04-04: qty 1

## 2019-04-04 MED ORDER — CHLORHEXIDINE GLUCONATE CLOTH 2 % EX PADS
6.0000 | MEDICATED_PAD | Freq: Every day | CUTANEOUS | Status: DC
  Administered 2019-04-04: 12:00:00 6 via TOPICAL

## 2019-04-04 NOTE — Plan of Care (Signed)

## 2019-04-04 NOTE — Discharge Summary (Addendum)
[facility] Discharge Summary  Patient name: [name] Medical record number: [id number] Date of birth: [birth year] Age: 58 y.o. Gender: female Date of Admission: 04/02/2019  Date of Discharge: 04/04/19   Admitting Physician: Martyn Malay, MD  Primary Care Provider: Matilde Haymaker, MD Consultants: Nephrology  Indication for Hospitalization: Abdominal pain, nausea vomiting  Discharge Diagnoses/Problem List:  Non-intractable nausea and vomiting Viral gastritis ESRD Hypertension Type 2 diabetes Chronic knee pain Hyperlipidemia HFpEF   Disposition: Home  Discharge Condition: Improved  Discharge Exam:  GEN: pleasant AA female, in no acute distress, resting comfortably in bed CV: regular rate and rhythm, no murmurs appreciated, right tunneled dialysis catheter RESP: no increased work of breathing, clear to ascultation bilaterally with no crackles, wheezes, or rhonchi  ABD: Bowel sounds present. Soft, mild RUQ tenderness, non-distended, no palpable masses, no CVA tenderness, negative Murphy sign MSK: no lower extremity edem, or cyanosis noted, no calf tenderness SKIN: warm, dry NEURO: grossly normal, moves all extremities appropriately PSYCH: Normal affect and thought content   Brief Hospital Course:   Abdominal pain  Nausea/Vomiting  [name] is a 59 y.o. female presented with persistent right lower quadrant abdominal pain that radiated to the epigastrium and chest for 2 days prior to arrival.  Patient had not been taking her medications at home due to associated nausea and vomiting.  Patient remained afebrile throughout admission. CT ABD/Pelvis no acute findings identified. Lipase elevated at 133, however transaminases were not elevated.  Leukocytosis (WBC 20.4) with left shift normalized prior to discharge.  Etiology likely due to viral gastritis.  Nausea and vomiting resolved with antiemetics.  Abdominal pain improved.  Patient is stable and  appropriate for discharge.                    Atypical chest pain Tachycardia The ED patient was tachycardic in the 130s.  Tachycardia resolved with restarting patient's home medication and slow 250 mL bolus.  Troponin ranged 148 -176.  Cardiology was curbsided and reported elevated troponin likely due to demand ischemia (likely secondary to tachycardia and/or hypovolemia).  Heart rate was controlled with home medication prior to discharge.    Issues for Follow Up:  1. Reassess patient's diet tolerance and resolution of abdominal pain.  Significant Procedures: None  Significant Labs and Imaging:  Recent Labs  Lab 04/02/19 1223 04/03/19 0420 04/04/19 0507  WBC 20.4* 15.5* 9.6  HGB 14.1 11.9* 11.4*  HCT 45.6 38.0 37.1  PLT 507* 387 349   Recent Labs  Lab 04/02/19 1223 04/03/19 0420 04/04/19 0507  NA 141 141 138  K 4.2 4.9 4.5  CL 98 98 93*  CO2 19* 21* 27  GLUCOSE 220* 117* 126*  BUN 63* 80* 39*  CREATININE 8.51* 9.92* 7.15*  CALCIUM 10.3 9.7 9.7  PHOS  --  10.4* 6.8*  ALKPHOS 110  --   --   AST 23  --   --   ALT 15  --   --   ALBUMIN 4.1 3.5 3.6    Ct Abdomen Pelvis Wo Contrast  Result Date: 04/02/2019 CLINICAL DATA:  58 year old female with history of abdominal distension. Central chest pain. Vomiting for the past 2 days. EXAM: CT ABDOMEN AND PELVIS WITHOUT CONTRAST TECHNIQUE: Multidetector CT imaging of the abdomen and pelvis was performed following the standard protocol without IV contrast. COMPARISON:  CT the abdomen and pelvis 02/05/2019. FINDINGS: Lower chest: Unremarkable. Hepatobiliary: No definite suspicious cystic or solid hepatic lesions  are confidently identified on today's noncontrast CT examination. Status post cholecystectomy. Pancreas: No definite pancreatic mass or peripancreatic fluid collections or inflammatory changes noted on today's noncontrast CT examination. Spleen: Unremarkable. Adrenals/Urinary Tract: No calcifications are identified within the  collecting system of either kidneys, along the course of either ureter, or within the lumen of the urinary bladder. No hydroureteronephrosis. Low-attenuation lesions in the right kidney measuring up to 1.8 cm in the interpolar region, incompletely characterized on today's non-contrast CT examination, but similar to the prior study and statistically likely to represent cysts. Previously noted gas and high attenuation in the interpolar region of the left kidney has completely resolved (related to remote biopsy). Small amount of bilateral perinephric soft tissue stranding (nonspecific). Urinary bladder is normal in appearance. Bilateral adrenal glands are normal in appearance. Stomach/Bowel: Normal appearance of the stomach. No pathologic dilatation of small bowel or colon. Numerous colonic diverticulae are noted, without surrounding inflammatory changes to suggest an acute diverticulitis at this time. Normal appendix. Vascular/Lymphatic: Aortic atherosclerosis. No lymphadenopathy noted in the abdomen or pelvis. Reproductive: Uterus is enlarged and heterogeneous in appearance with multiple small lesions, some of which are coarsely calcified, presumably multifocal fibroids. Ovaries are unremarkable in appearance. Other: Small umbilical and supraumbilical ventral hernias containing only omental fat. No significant volume of ascites. No pneumoperitoneum. Musculoskeletal: There are no aggressive appearing lytic or blastic lesions noted in the visualized portions of the skeleton. IMPRESSION: 1. No acute findings are noted in the abdomen or pelvis to account for the patient's symptoms. 2. Small umbilical and supraumbilical ventral hernias containing only omental fat. No associated bowel incarceration or obstruction at this time. 3. Normal appendix. 4. Aortic atherosclerosis. 5. Fibroid uterus. 6. Additional incidental findings, as above. Electronically Signed   By: Vinnie Langton M.D.   On: 04/02/2019 13:42   Dg Chest  Port 1 View  Result Date: 04/02/2019 CLINICAL DATA:  Shortness of breath. Central chest pain. Vomiting. EXAM: PORTABLE CHEST 1 VIEW COMPARISON:  Chest radiograph 02/05/2019 FINDINGS: Right internal jugular dialysis catheter tip in the mid-upper SVC. Mild cardiomegaly not significantly changed from prior. Unchanged mediastinal contours. No pulmonary edema, focal airspace disease, pleural effusion or pneumothorax. No acute osseous abnormalities are seen. IMPRESSION: 1. Unchanged mild cardiomegaly. 2. Right internal jugular dialysis catheter tip in the mid-upper SVC. No other acute findings or change from prior exam. Electronically Signed   By: Keith Rake M.D.   On: 04/02/2019 13:08   US Abdomen Limited Ruq  Result Date: 04/03/2019 CLINICAL DATA:  Right upper quadrant pain for 1 day. Previous cholecystectomy. EXAM: ULTRASOUND ABDOMEN LIMITED RIGHT UPPER QUADRANT COMPARISON:  CT abdomen and pelvis 04/02/2019. Renal ultrasound 02/08/2019. FINDINGS: Gallbladder: Surgically absent. Common bile duct: Diameter: 4 mm Liver: No focal lesion identified. Within normal limits in parenchymal echogenicity. Portal vein is patent on color Doppler imaging with normal direction of blood flow towards the liver. Other: Increased right renal parenchymal echogenicity, also present on the prior renal ultrasound. IMPRESSION: 1. Status post cholecystectomy.  No biliary dilatation. 2. Unremarkable appearance of the liver. 3. Echogenic right kidney compatible with medical renal disease. Electronically Signed   By: Logan Bores M.D.   On: 04/03/2019 06:48    Results/Tests Pending at Time of Discharge: None  Discharge Medications:  Allergies as of 04/04/2019      Reactions   Lisinopril Anaphylaxis, Other (See Comments)   angioedema   Camellia Other (See Comments)   Jardiance [empagliflozin] Rash      Medication List  TAKE these medications   Accu-Chek Aviva Plus w/Device Kit Use to check sugar three times a day    accu-chek soft touch lancets Use to check sugars three times a day   Accu-Chek Softclix Lancets lancets Use as instructed   acetaminophen 500 MG tablet Commonly known as: TYLENOL Take 1,000 mg by mouth every 6 (six) hours as needed for moderate pain or headache.   amLODipine 10 MG tablet Commonly known as: NORVASC TAKE 1 TABLET BY MOUTH ONCE DAILY   aspirin EC 325 MG tablet Take 325 mg by mouth daily.   atorvastatin 20 MG tablet Commonly known as: LIPITOR TAKE 1 TABLET BY MOUTH ONCE DAILY AT  6PM What changed: See the new instructions.   Auryxia 1 GM 210 MG(Fe) tablet Generic drug: ferric citrate Take 210 mg by mouth 3 (three) times daily.   carvedilol 25 MG tablet Commonly known as: COREG Take 1 tablet (25 mg total) by mouth 2 (two) times daily with a meal.   cloNIDine 0.2 MG tablet Commonly known as: CATAPRES Take 0.2 mg by mouth 2 (two) times daily.   Dialyvite 800 0.8 MG Tabs Take 1 tablet by mouth daily.   ferrous sulfate 325 (65 FE) MG tablet Take 325 mg by mouth daily with breakfast.   furosemide 40 MG tablet Commonly known as: LASIX Take 40 mg by mouth 2 (two) times daily.   glucose blood test strip Commonly known as: Accu-Chek Aviva Plus Use as instructed   polyethylene glycol 17 g packet Commonly known as: MIRALAX / GLYCOLAX Take 17 g by mouth daily as needed for moderate constipation.   potassium chloride SA 20 MEQ tablet Commonly known as: KLOR-CON Take 20 mEq by mouth daily.   traMADol 50 MG tablet Commonly known as: Ultram Take 1 tablet (50 mg total) by mouth every 6 (six) hours as needed.   Vitamin D (Ergocalciferol) 1.25 MG (50000 UT) Caps capsule Commonly known as: DRISDOL Take 50,000 Units by mouth every Monday.       Discharge Instructions: Please refer to Patient Instructions section of EMR for full details.  Patient was counseled important signs and symptoms that should prompt return to medical care, changes in medications,  dietary instructions, activity restrictions, and follow up appointments.   Follow-Up Appointments:   Lyndee Hensen, MD 04/04/2019, 2:12 PM PGY-1, [place] [facility] ------------------------------------------------------------------------------- Upper Level Addendum: I have evaluated this patient along with Dr. Susa Simmonds and reviewed the above note, making necessary revisions in blue.  Guadalupe Dawn MD PGY-3 Family Medicine Resident

## 2019-04-04 NOTE — Progress Notes (Addendum)
Family Medicine Teaching Service Daily Progress Note Intern Pager: [phone]  Patient name: [name] Medical record number: [id number] Date of birth: [birth year] Age: 58 y.o. Gender: female  Primary Care Provider: Matilde Haymaker, MD Consultants: Nephrology Code Status: full  Pt Overview and Major Events to Date:  11/27 admitted to family medicine teaching service  Assessment and Plan: [name] is a 58 y.o. female presenting with intractable nausea and vomiting, along with right upper quadrant abdominal pain.  Nausea/vomiting/abdominal pain Patient reports improved abdominal pain today.  Denies nausea and vomiting.  Etiology unclear, possible pancreatitis given elevated lipase vs gastritis due to GI virus. RUQ [place] ultrasound and CT ABD/Pelvis negative for acute pathology.  Patient is stable for discharge home. - Renal-carb modified diet with fluid restriction  - Continue Zofran 4 mg every 8 hours - Monitor fluid status  ESRD  K4.5, creatinine 7.15, BUN 39 Dialysis schedule is M/W/F.  Patient had dialysis yesterday. Patient recently had LUE AV fistula created on 02/17/2019 by Dr. Donzetta Matters.   -nephrology following, appreciate recommendations. -Daily renal function panel   HTN  Tachycardia  Stable.  HR 86, BP 92/70 -Continue home Coreg 25mg  BID, Clonidine 0.2mg  BID,    Leukocytosis, resolved.  Admission WBC 20.4 has down-trended to 9.6.  Likely secondary to dehydration, possible GI related inflammation, and ESRD status. - Continue to monitor with CBC  - BCx no growth at 48 hours.  Elevated troponins Ranging between 176 and 148.  Could potentially represent her baseline in setting of ESRD and dehydration. Denies chest pain.   Type 2 diabetes Last A1c 5.1.  Glucose 220 on admission. Fasting glucose 129. Received 2 units SSI.  - sliding scale insulin.  Chronic bilateral knee pain  Home medications include 50 mg tramadol every 6 hours as needed. -Hold home medication    Hyperlipidemia Home medications include aspirin 325 mg daily, atorvastatin 20 mg daily.  Last lipid panel on 11/12/2018: Cholesterol 181, TGs 95, HDL 55, LDL 107.  -Continue home atorvastatin -Hold home aspirin  HFpEF ECHO on 02/05/2019 indicated EF 60 to 65% with moderately increased LVH and impaired relaxation pattern of LV diastolic filling.  Home medications include Coreg 25 mg twice daily, Lasix 40 mg twice daily, potassium ER 20 mg daily. No signs of volume overload on exam. -Continue home medication   FEN/GI: Liquid diet, advance as tolerated PPx: Heparin 5000 units, 3 times daily  Disposition: Home today  Subjective:  [name] reports improved abdominal pain.  Denies nausea and vomiting.  There were no overnight significant events.  Objective: Temp:  [97.8 F (36.6 C)-98.6 F (37 C)] 98.4 F (36.9 C) (11/29 0511) Pulse Rate:  [84-113] 92 (11/29 0511) Resp:  [18-20] 18 (11/29 0511) BP: (93-160)/(58-90) 110/72 (11/29 0511) SpO2:  [98 %-100 %] 99 % (11/29 0511) Weight:  [105.4 kg] 105.4 kg (11/29 0511)  Physical Exam: GEN: pleasant AA female, in no acute distress, resting comfortably in bed CV: regular rate and rhythm, no murmurs appreciated  RESP: no increased work of breathing, clear to ascultation bilaterally with no crackles, wheezes, or rhonchi  ABD: Bowel sounds present. Soft, mild RUQ tenderness, non-distended, no palpable masses, no CVA tenderness, negative Murphy sign MSK: no lower extremity edem, or cyanosis noted, no calf tenderness SKIN: warm, dry NEURO: grossly normal, moves all extremities appropriately PSYCH: Normal affect and thought content   Laboratory: Recent Labs  Lab 04/02/19 1223 04/03/19 0420 04/04/19 0507  WBC 20.4* 15.5* 9.6  HGB 14.1  11.9* 11.4*  HCT 45.6 38.0 37.1  PLT 507* 387 349   Recent Labs  Lab 04/02/19 1223 04/03/19 0420 04/04/19 0507  NA 141 141 138  K 4.2 4.9 4.5  CL 98 98 93*  CO2 19* 21* 27  BUN 63* 80* 39*   CREATININE 8.51* 9.92* 7.15*  CALCIUM 10.3 9.7 9.7  PROT 9.0*  --   --   BILITOT 0.8  --   --   ALKPHOS 110  --   --   ALT 15  --   --   AST 23  --   --   GLUCOSE 220* 117* 126*      Imaging/Diagnostic Tests: CLINICAL DATA:  Right upper quadrant pain for 1 day. Previous cholecystectomy.  EXAM: ULTRASOUND ABDOMEN LIMITED RIGHT UPPER QUADRANT  COMPARISON:  CT abdomen and pelvis 04/02/2019. Renal ultrasound 02/08/2019.  FINDINGS: Gallbladder:  Surgically absent.  Common bile duct:  Diameter: 4 mm  Liver:  No focal lesion identified. Within normal limits in parenchymal echogenicity. Portal vein is patent on color Doppler imaging with normal direction of blood flow towards the liver.  Other: Increased right renal parenchymal echogenicity, also present on the prior renal ultrasound.  IMPRESSION: 1. Status post cholecystectomy.  No biliary dilatation. 2. Unremarkable appearance of the liver. 3. Echogenic right kidney compatible with medical renal disease.  Lyndee Hensen, MD 04/04/2019, 7:24 AM PGY-1, [place] Intern pager: [phone], text pages welcome

## 2019-04-04 NOTE — Plan of Care (Signed)
Problem: Education: Goal: Knowledge of General Education information will improve Description: Including pain rating scale, medication(s)/side effects and non-pharmacologic comfort measures 04/04/2019 1510 by Myriam Forehand, RN Outcome: Completed/Met 04/04/2019 1510 by Myriam Forehand, RN Outcome: Adequate for Discharge 04/04/2019 1509 by Myriam Forehand, RN Outcome: Adequate for Discharge 04/04/2019 1509 by Myriam Forehand, RN Outcome: Adequate for Discharge   Problem: Health Behavior/Discharge Planning: Goal: Ability to manage health-related needs will improve 04/04/2019 1510 by Myriam Forehand, RN Outcome: Completed/Met 04/04/2019 1510 by Myriam Forehand, RN Outcome: Adequate for Discharge 04/04/2019 1509 by Myriam Forehand, RN Outcome: Adequate for Discharge 04/04/2019 1509 by Myriam Forehand, RN Outcome: Adequate for Discharge   Problem: Clinical Measurements: Goal: Ability to maintain clinical measurements within normal limits will improve 04/04/2019 1510 by Myriam Forehand, RN Outcome: Completed/Met 04/04/2019 1510 by Myriam Forehand, RN Outcome: Adequate for Discharge 04/04/2019 1509 by Myriam Forehand, RN Outcome: Adequate for Discharge 04/04/2019 1509 by Myriam Forehand, RN Outcome: Adequate for Discharge Goal: Will remain free from infection 04/04/2019 1510 by Myriam Forehand, RN Outcome: Completed/Met 04/04/2019 1510 by Myriam Forehand, RN Outcome: Adequate for Discharge 04/04/2019 1509 by Myriam Forehand, RN Outcome: Adequate for Discharge 04/04/2019 1509 by Myriam Forehand, RN Outcome: Adequate for Discharge Goal: Diagnostic test results will improve 04/04/2019 1510 by Myriam Forehand, RN Outcome: Completed/Met 04/04/2019 1510 by Myriam Forehand, RN Outcome: Adequate for Discharge 04/04/2019 1509 by Myriam Forehand, RN Outcome: Adequate for Discharge 04/04/2019 1509 by Myriam Forehand, RN Outcome: Adequate for Discharge Goal: Respiratory complications will improve 04/04/2019 1510 by Myriam Forehand, RN Outcome: Completed/Met 04/04/2019 1510 by Myriam Forehand, RN Outcome: Adequate for Discharge 04/04/2019 1509 by Myriam Forehand, RN Outcome: Adequate for Discharge 04/04/2019 1509 by Myriam Forehand, RN Outcome: Adequate for Discharge Goal: Cardiovascular complication will be avoided 04/04/2019 1510 by Myriam Forehand, RN Outcome: Completed/Met 04/04/2019 1510 by Myriam Forehand, RN Outcome: Adequate for Discharge 04/04/2019 1509 by Myriam Forehand, RN Outcome: Adequate for Discharge 04/04/2019 1509 by Myriam Forehand, RN Outcome: Adequate for Discharge   Problem: Activity: Goal: Risk for activity intolerance will decrease 04/04/2019 1510 by Myriam Forehand, RN Outcome: Completed/Met 04/04/2019 1510 by Myriam Forehand, RN Outcome: Adequate for Discharge 04/04/2019 1509 by Myriam Forehand, RN Outcome: Adequate for Discharge 04/04/2019 1509 by Myriam Forehand, RN Outcome: Adequate for Discharge   Problem: Nutrition: Goal: Adequate nutrition will be maintained 04/04/2019 1510 by Myriam Forehand, RN Outcome: Completed/Met 04/04/2019 1510 by Myriam Forehand, RN Outcome: Adequate for Discharge 04/04/2019 1509 by Myriam Forehand, RN Outcome: Adequate for Discharge 04/04/2019 1509 by Myriam Forehand, RN Outcome: Adequate for Discharge   Problem: Coping: Goal: Level of anxiety will decrease 04/04/2019 1510 by Myriam Forehand, RN Outcome: Completed/Met 04/04/2019 1510 by Myriam Forehand, RN Outcome: Adequate for Discharge 04/04/2019 1509 by Myriam Forehand, RN Outcome: Adequate for Discharge 04/04/2019 1509 by Myriam Forehand, RN Outcome: Adequate for Discharge   Problem: Elimination: Goal: Will not experience complications related to bowel motility 04/04/2019 1510 by Myriam Forehand, RN Outcome: Completed/Met 04/04/2019 1510 by Myriam Forehand, RN Outcome: Adequate for Discharge 04/04/2019 1509 by Myriam Forehand, RN Outcome: Adequate for Discharge 04/04/2019 1509 by Myriam Forehand, RN Outcome:  Adequate for Discharge Goal: Will not experience complications related to urinary retention 04/04/2019 1510 by Myriam Forehand, RN Outcome: Completed/Met 04/04/2019 1510 by Myriam Forehand, RN Outcome: Adequate for Discharge 04/04/2019 1509 by Myriam Forehand, RN Outcome: Adequate for Discharge 04/04/2019 1509 by Myriam Forehand, RN Outcome: Adequate for Discharge   Problem: Pain Managment: Goal: General experience of comfort will improve 04/04/2019 1510 by  Myriam Forehand, RN Outcome: Completed/Met 04/04/2019 1510 by Myriam Forehand, RN Outcome: Adequate for Discharge 04/04/2019 1509 by Myriam Forehand, RN Outcome: Adequate for Discharge 04/04/2019 1509 by Myriam Forehand, RN Outcome: Adequate for Discharge   Problem: Safety: Goal: Ability to remain free from injury will improve 04/04/2019 1510 by Myriam Forehand, RN Outcome: Completed/Met 04/04/2019 1510 by Myriam Forehand, RN Outcome: Adequate for Discharge 04/04/2019 1509 by Myriam Forehand, RN Outcome: Adequate for Discharge 04/04/2019 1509 by Myriam Forehand, RN Outcome: Adequate for Discharge   Problem: Skin Integrity: Goal: Risk for impaired skin integrity will decrease 04/04/2019 1510 by Myriam Forehand, RN Outcome: Completed/Met 04/04/2019 1510 by Myriam Forehand, RN Outcome: Adequate for Discharge 04/04/2019 1509 by Myriam Forehand, RN Outcome: Adequate for Discharge 04/04/2019 1509 by Myriam Forehand, RN Outcome: Adequate for Discharge

## 2019-04-04 NOTE — Plan of Care (Signed)
  Problem: Education: Goal: Knowledge of General Education information will improve Description: Including pain rating scale, medication(s)/side effects and non-pharmacologic comfort measures 04/04/2019 1509 by Myriam Forehand, RN Outcome: Adequate for Discharge 04/04/2019 1509 by Myriam Forehand, RN Outcome: Adequate for Discharge   Problem: Health Behavior/Discharge Planning: Goal: Ability to manage health-related needs will improve 04/04/2019 1509 by Myriam Forehand, RN Outcome: Adequate for Discharge 04/04/2019 1509 by Myriam Forehand, RN Outcome: Adequate for Discharge   Problem: Clinical Measurements: Goal: Ability to maintain clinical measurements within normal limits will improve 04/04/2019 1509 by Myriam Forehand, RN Outcome: Adequate for Discharge 04/04/2019 1509 by Myriam Forehand, RN Outcome: Adequate for Discharge Goal: Will remain free from infection 04/04/2019 1509 by Myriam Forehand, RN Outcome: Adequate for Discharge 04/04/2019 1509 by Myriam Forehand, RN Outcome: Adequate for Discharge Goal: Diagnostic test results will improve 04/04/2019 1509 by Myriam Forehand, RN Outcome: Adequate for Discharge 04/04/2019 1509 by Myriam Forehand, RN Outcome: Adequate for Discharge Goal: Respiratory complications will improve 04/04/2019 1509 by Myriam Forehand, RN Outcome: Adequate for Discharge 04/04/2019 1509 by Myriam Forehand, RN Outcome: Adequate for Discharge Goal: Cardiovascular complication will be avoided 04/04/2019 1509 by Myriam Forehand, RN Outcome: Adequate for Discharge 04/04/2019 1509 by Myriam Forehand, RN Outcome: Adequate for Discharge   Problem: Activity: Goal: Risk for activity intolerance will decrease 04/04/2019 1509 by Myriam Forehand, RN Outcome: Adequate for Discharge 04/04/2019 1509 by Myriam Forehand, RN Outcome: Adequate for Discharge   Problem: Nutrition: Goal: Adequate nutrition will be maintained 04/04/2019 1509 by Myriam Forehand, RN Outcome: Adequate for  Discharge 04/04/2019 1509 by Myriam Forehand, RN Outcome: Adequate for Discharge   Problem: Coping: Goal: Level of anxiety will decrease 04/04/2019 1509 by Myriam Forehand, RN Outcome: Adequate for Discharge 04/04/2019 1509 by Myriam Forehand, RN Outcome: Adequate for Discharge   Problem: Elimination: Goal: Will not experience complications related to bowel motility 04/04/2019 1509 by Myriam Forehand, RN Outcome: Adequate for Discharge 04/04/2019 1509 by Myriam Forehand, RN Outcome: Adequate for Discharge Goal: Will not experience complications related to urinary retention 04/04/2019 1509 by Myriam Forehand, RN Outcome: Adequate for Discharge 04/04/2019 1509 by Myriam Forehand, RN Outcome: Adequate for Discharge   Problem: Pain Managment: Goal: General experience of comfort will improve 04/04/2019 1509 by Myriam Forehand, RN Outcome: Adequate for Discharge 04/04/2019 1509 by Myriam Forehand, RN Outcome: Adequate for Discharge   Problem: Safety: Goal: Ability to remain free from injury will improve 04/04/2019 1509 by Myriam Forehand, RN Outcome: Adequate for Discharge 04/04/2019 1509 by Myriam Forehand, RN Outcome: Adequate for Discharge   Problem: Skin Integrity: Goal: Risk for impaired skin integrity will decrease 04/04/2019 1509 by Myriam Forehand, RN Outcome: Adequate for Discharge 04/04/2019 1509 by Myriam Forehand, RN Outcome: Adequate for Discharge

## 2019-04-04 NOTE — Plan of Care (Signed)
Problem: Education: Goal: Knowledge of General Education information will improve Description: Including pain rating scale, medication(s)/side effects and non-pharmacologic comfort measures 04/04/2019 1510 by Myriam Forehand, RN Outcome: Adequate for Discharge 04/04/2019 1509 by Myriam Forehand, RN Outcome: Adequate for Discharge 04/04/2019 1509 by Myriam Forehand, RN Outcome: Adequate for Discharge   Problem: Health Behavior/Discharge Planning: Goal: Ability to manage health-related needs will improve 04/04/2019 1510 by Myriam Forehand, RN Outcome: Adequate for Discharge 04/04/2019 1509 by Myriam Forehand, RN Outcome: Adequate for Discharge 04/04/2019 1509 by Myriam Forehand, RN Outcome: Adequate for Discharge   Problem: Clinical Measurements: Goal: Ability to maintain clinical measurements within normal limits will improve 04/04/2019 1510 by Myriam Forehand, RN Outcome: Adequate for Discharge 04/04/2019 1509 by Myriam Forehand, RN Outcome: Adequate for Discharge 04/04/2019 1509 by Myriam Forehand, RN Outcome: Adequate for Discharge Goal: Will remain free from infection 04/04/2019 1510 by Myriam Forehand, RN Outcome: Adequate for Discharge 04/04/2019 1509 by Myriam Forehand, RN Outcome: Adequate for Discharge 04/04/2019 1509 by Myriam Forehand, RN Outcome: Adequate for Discharge Goal: Diagnostic test results will improve 04/04/2019 1510 by Myriam Forehand, RN Outcome: Adequate for Discharge 04/04/2019 1509 by Myriam Forehand, RN Outcome: Adequate for Discharge 04/04/2019 1509 by Myriam Forehand, RN Outcome: Adequate for Discharge Goal: Respiratory complications will improve 04/04/2019 1510 by Myriam Forehand, RN Outcome: Adequate for Discharge 04/04/2019 1509 by Myriam Forehand, RN Outcome: Adequate for Discharge 04/04/2019 1509 by Myriam Forehand, RN Outcome: Adequate for Discharge Goal: Cardiovascular complication will be avoided 04/04/2019 1510 by Myriam Forehand, RN Outcome: Adequate for  Discharge 04/04/2019 1509 by Myriam Forehand, RN Outcome: Adequate for Discharge 04/04/2019 1509 by Myriam Forehand, RN Outcome: Adequate for Discharge   Problem: Activity: Goal: Risk for activity intolerance will decrease 04/04/2019 1510 by Myriam Forehand, RN Outcome: Adequate for Discharge 04/04/2019 1509 by Myriam Forehand, RN Outcome: Adequate for Discharge 04/04/2019 1509 by Myriam Forehand, RN Outcome: Adequate for Discharge   Problem: Nutrition: Goal: Adequate nutrition will be maintained 04/04/2019 1510 by Myriam Forehand, RN Outcome: Adequate for Discharge 04/04/2019 1509 by Myriam Forehand, RN Outcome: Adequate for Discharge 04/04/2019 1509 by Myriam Forehand, RN Outcome: Adequate for Discharge   Problem: Coping: Goal: Level of anxiety will decrease 04/04/2019 1510 by Myriam Forehand, RN Outcome: Adequate for Discharge 04/04/2019 1509 by Myriam Forehand, RN Outcome: Adequate for Discharge 04/04/2019 1509 by Myriam Forehand, RN Outcome: Adequate for Discharge   Problem: Elimination: Goal: Will not experience complications related to bowel motility 04/04/2019 1510 by Myriam Forehand, RN Outcome: Adequate for Discharge 04/04/2019 1509 by Myriam Forehand, RN Outcome: Adequate for Discharge 04/04/2019 1509 by Myriam Forehand, RN Outcome: Adequate for Discharge Goal: Will not experience complications related to urinary retention 04/04/2019 1510 by Myriam Forehand, RN Outcome: Adequate for Discharge 04/04/2019 1509 by Myriam Forehand, RN Outcome: Adequate for Discharge 04/04/2019 1509 by Myriam Forehand, RN Outcome: Adequate for Discharge   Problem: Pain Managment: Goal: General experience of comfort will improve 04/04/2019 1510 by Myriam Forehand, RN Outcome: Adequate for Discharge 04/04/2019 1509 by Myriam Forehand, RN Outcome: Adequate for Discharge 04/04/2019 1509 by Myriam Forehand, RN Outcome: Adequate for Discharge   Problem: Safety: Goal: Ability to remain free from injury will  improve 04/04/2019 1510 by Myriam Forehand, RN Outcome: Adequate for Discharge 04/04/2019 1509 by Myriam Forehand, RN Outcome: Adequate for Discharge 04/04/2019 1509 by Myriam Forehand, RN Outcome: Adequate for Discharge   Problem: Skin Integrity: Goal: Risk for impaired skin integrity will decrease 04/04/2019 1510 by Myriam Forehand, RN Outcome: Adequate for Discharge 04/04/2019 1509 by  Myriam Forehand, RN Outcome: Adequate for Discharge 04/04/2019 1509 by Myriam Forehand, RN Outcome: Adequate for Discharge

## 2019-04-04 NOTE — Progress Notes (Signed)
[facility] Observation Note   Subjective:   Patient is a 58 year old female with a PMH including ESRD, cholecystectomy, and HTN who presented to the ED on 04/02/19 with intractable nausea and vomiting and atypical chest pain x 2 days. In the ED, BP 178/97, pulse 123, T 97.8, K+ 4.2, BUN 63, Cr 8.51, WBC 20.4 and Hgb 14.1. She was given metoprolol for tachycardia. Unclear etiology of nausea and vomiting -per primary, but ddx including possible pancreatitis or GI virus. RUS [place] negative. Hypertension and tachycardia were improved with patient's home coreg. Troponins were elevated on admission, primary trending. Nephrology was consulted for hemodialysis needs. Patient is new to dialysis for about 1 month. She did miss HD on 11/27.  Patient underwent HD on 20/10/07 without complications.   She reports she is feeling much better today and hoping to go home. Denies SOB, orthopnea, dizziness, CP, palpitations, abdominal pain, N/V/D at present. Leukocytosis improved to 15.5. BP is now a bit soft.   Objective Vitals:   04/03/19 2036 04/04/19 0023 04/04/19 0511 04/04/19 0730  BP: 107/71 93/67 110/72 92/70  Pulse: 91 84 92 86  Resp: 18  18 20   Temp: 98.6 F (37 C) 97.8 F (36.6 C) 98.4 F (36.9 C) 97.6 F (36.4 C)  TempSrc: Oral Oral Oral Oral  SpO2: 99% 98% 99% 100%  Weight:   105.4 kg   Height:       Physical Exam General: Well developed, obese female. Alert, pleasant, NAD Heart: RRR, no murmurs, rubs or gallops Lungs: CTA b/l without wheezing, rhonchi or rales Abdomen: Soft, non-tender, non-distended. +NS Extremities: no edema b/l lower extremities Dialysis Access:  TDC, maturing LUE AVF + bruit  Additional Objective Labs: Basic Metabolic Panel: Recent Labs  Lab 04/02/19 1223 04/03/19 0420 04/04/19 0507  NA 141 141 138  K 4.2 4.9 4.5  CL 98 98 93*  CO2 19* 21* 27  GLUCOSE 220* 117* 126*  BUN 63* 80* 39*  CREATININE 8.51* 9.92* 7.15*  CALCIUM 10.3 9.7 9.7  PHOS   --  10.4* 6.8*   Liver Function Tests: Recent Labs  Lab 04/02/19 1223 04/03/19 0420 04/04/19 0507  AST 23  --   --   ALT 15  --   --   ALKPHOS 110  --   --   BILITOT 0.8  --   --   PROT 9.0*  --   --   ALBUMIN 4.1 3.5 3.6   Recent Labs  Lab 04/02/19 1223  LIPASE 133*   CBC: Recent Labs  Lab 04/02/19 1223 04/03/19 0420 04/04/19 0507  WBC 20.4* 15.5* 9.6  NEUTROABS 17.9*  --   --   HGB 14.1 11.9* 11.4*  HCT 45.6 38.0 37.1  MCV 86.5 85.6 86.9  PLT 507* 387 349   Blood Culture    Component Value Date/Time   SDES BLOOD RIGHT ARM 04/02/2019 2055   SPECREQUEST AEROBIC BOTTLE ONLY Blood Culture adequate volume 04/02/2019 2055   CULT  04/02/2019 2055    NO GROWTH 2 DAYS Performed at [facility] [address], [place], [place] [address]    REPTSTATUS PENDING 04/02/2019 2055    CBG: Recent Labs  Lab 04/03/19 0626 04/03/19 1129 04/03/19 1841 04/03/19 2118 04/04/19 0628  GLUCAP 117* 196* 99 223* 129*   Studies/Results: Ct Abdomen Pelvis Wo Contrast  Result Date: 04/02/2019 CLINICAL DATA:  58 year old female with history of abdominal distension. Central chest pain. Vomiting for the past 2 days. EXAM:  CT ABDOMEN AND PELVIS WITHOUT CONTRAST TECHNIQUE: Multidetector CT imaging of the abdomen and pelvis was performed following the standard protocol without IV contrast. COMPARISON:  CT the abdomen and pelvis 02/05/2019. FINDINGS: Lower chest: Unremarkable. Hepatobiliary: No definite suspicious cystic or solid hepatic lesions are confidently identified on today's noncontrast CT examination. Status post cholecystectomy. Pancreas: No definite pancreatic mass or peripancreatic fluid collections or inflammatory changes noted on today's noncontrast CT examination. Spleen: Unremarkable. Adrenals/Urinary Tract: No calcifications are identified within the collecting system of either kidneys, along the course of either ureter, or within the lumen of the urinary bladder. No  hydroureteronephrosis. Low-attenuation lesions in the right kidney measuring up to 1.8 cm in the interpolar region, incompletely characterized on today's non-contrast CT examination, but similar to the prior study and statistically likely to represent cysts. Previously noted gas and high attenuation in the interpolar region of the left kidney has completely resolved (related to remote biopsy). Small amount of bilateral perinephric soft tissue stranding (nonspecific). Urinary bladder is normal in appearance. Bilateral adrenal glands are normal in appearance. Stomach/Bowel: Normal appearance of the stomach. No pathologic dilatation of small bowel or colon. Numerous colonic diverticulae are noted, without surrounding inflammatory changes to suggest an acute diverticulitis at this time. Normal appendix. Vascular/Lymphatic: Aortic atherosclerosis. No lymphadenopathy noted in the abdomen or pelvis. Reproductive: Uterus is enlarged and heterogeneous in appearance with multiple small lesions, some of which are coarsely calcified, presumably multifocal fibroids. Ovaries are unremarkable in appearance. Other: Small umbilical and supraumbilical ventral hernias containing only omental fat. No significant volume of ascites. No pneumoperitoneum. Musculoskeletal: There are no aggressive appearing lytic or blastic lesions noted in the visualized portions of the skeleton. IMPRESSION: 1. No acute findings are noted in the abdomen or pelvis to account for the patient's symptoms. 2. Small umbilical and supraumbilical ventral hernias containing only omental fat. No associated bowel incarceration or obstruction at this time. 3. Normal appendix. 4. Aortic atherosclerosis. 5. Fibroid uterus. 6. Additional incidental findings, as above. Electronically Signed   By: Vinnie Langton M.D.   On: 04/02/2019 13:42   Dg Chest Port 1 View  Result Date: 04/02/2019 CLINICAL DATA:  Shortness of breath. Central chest pain. Vomiting. EXAM: PORTABLE  CHEST 1 VIEW COMPARISON:  Chest radiograph 02/05/2019 FINDINGS: Right internal jugular dialysis catheter tip in the mid-upper SVC. Mild cardiomegaly not significantly changed from prior. Unchanged mediastinal contours. No pulmonary edema, focal airspace disease, pleural effusion or pneumothorax. No acute osseous abnormalities are seen. IMPRESSION: 1. Unchanged mild cardiomegaly. 2. Right internal jugular dialysis catheter tip in the mid-upper SVC. No other acute findings or change from prior exam. Electronically Signed   By: Keith Rake M.D.   On: 04/02/2019 13:08   US Abdomen Limited Ruq  Result Date: 04/03/2019 CLINICAL DATA:  Right upper quadrant pain for 1 day. Previous cholecystectomy. EXAM: ULTRASOUND ABDOMEN LIMITED RIGHT UPPER QUADRANT COMPARISON:  CT abdomen and pelvis 04/02/2019. Renal ultrasound 02/08/2019. FINDINGS: Gallbladder: Surgically absent. Common bile duct: Diameter: 4 mm Liver: No focal lesion identified. Within normal limits in parenchymal echogenicity. Portal vein is patent on color Doppler imaging with normal direction of blood flow towards the liver. Other: Increased right renal parenchymal echogenicity, also present on the prior renal ultrasound. IMPRESSION: 1. Status post cholecystectomy.  No biliary dilatation. 2. Unremarkable appearance of the liver. 3. Echogenic right kidney compatible with medical renal disease. Electronically Signed   By: Logan Bores M.D.   On: 04/03/2019 06:48   Medications: . sodium chloride    .  sodium chloride    . sodium chloride    . sodium chloride     . aspirin EC  325 mg Oral Daily  . atorvastatin  20 mg Oral q1800  . carvedilol  25 mg Oral BID WC  . Chlorhexidine Gluconate Cloth  6 each Topical Q0600  . cloNIDine  0.2 mg Oral BID  . ferrous sulfate  325 mg Oral Q breakfast  . heparin  5,000 Units Subcutaneous Q8H  . insulin aspart  0-5 Units Subcutaneous QHS  . insulin aspart  0-6 Units Subcutaneous TID WC    Dialysis  Orders: [facility] on MWF 180NRe Time: 4 hours, BFR 400, DFR 800, 3K/ 2.25Ca, TDC, Heparin 2000 units, EDW 111kg [name] 3 mg IV q HD Mircera 100 mcg IV q 2 weeks- last dose 03/22/19 Auryxia 210mg  PO TID   Assessment/Plan: 1. Intractable N/V, atypical chest pain: Unclear etiology, possible pancreatitis or GI infection, per primary. Symptoms resolved today.  2. ESRD: MWF, underwent HD on 11/28 due to missed treatment on 11/27. K+ 4.5, BUN 39. Next HD 11/30, can dialyze at her outpatient clinic if discharged today.   3. HTN/volume:  BP elevated on admission, now soft. Continue home meds, decrease clonidine if BP remains soft. UF with HD as tolerated. Well below EDW by weights here, will reevaluate with standing weights with HD.  4. Anemia: Hgb 11.4. No indication for ESA at this time.  5. Secondary hyperparathyroidism:  Corr calcium 10.0, hectorol held yesterday, resume at lower dose on discharge. Phos elevated, will restart [place]. 6. Nutrition:   Renal diet with fluid restrictions once tolerating PO  Anice Paganini, PA-C 04/04/2019, 10:52 AM  [facility] Pager: [phone]

## 2019-04-05 ENCOUNTER — Other Ambulatory Visit: Payer: Self-pay

## 2019-04-05 ENCOUNTER — Encounter (HOSPITAL_COMMUNITY): Payer: Self-pay | Admitting: *Deleted

## 2019-04-05 NOTE — Progress Notes (Signed)
Pt denies any acute cardiopulmonary issues. Pt stated that she goes to " [facility]" for primary care. Pt denies having a cardiologist. Pt denies having a stress test and cardiac cath. Pt made aware to stop taking  vitamins, fish oil and herbal medications. Do not take any NSAIDs ie: Ibuprofen, Advil, Naproxen (Aleve), Motrin, BC and Goody Powder. Pt stated that she does not take diabetes medication. Pt made aware to check CBG every 2 hours prior to arrival to hospital on DOS. Pt made aware to treat a CBG < 70 with 4 ounces of apple or cranberry juice, wait 15 minutes after intervention to recheck BG, if CBG remains < 70, call Short Stay unit to speak with a nurse. Pt reminded to quarantine.  Pt verbalized understanding of all pre-op instructions. PA, Anesthesiology, asked to review pt history.

## 2019-04-05 NOTE — Anesthesia Preprocedure Evaluation (Addendum)
Anesthesia Evaluation  Patient identified by MRN, date of birth, ID band Patient awake    Reviewed: Allergy & Precautions, NPO status , Patient's Chart, lab work & pertinent test results  Airway Mallampati: II  TM Distance: >3 FB Neck ROM: Full    Dental no notable dental hx.    Pulmonary sleep apnea , former smoker,    Pulmonary exam normal breath sounds clear to auscultation       Cardiovascular hypertension, Normal cardiovascular exam Rhythm:Regular Rate:Normal  Patient BP 80's over 40's today. Noted to be the same in ED last week. Patient feels well, was dialyzed yesterday   Neuro/Psych negative neurological ROS  negative psych ROS   GI/Hepatic Neg liver ROS, GERD  ,  Endo/Other  diabetes  Renal/GU DialysisRenal disease  negative genitourinary   Musculoskeletal negative musculoskeletal ROS (+)   Abdominal   Peds negative pediatric ROS (+)  Hematology negative hematology ROS (+)   Anesthesia Other Findings   Reproductive/Obstetrics negative OB ROS                            Anesthesia Physical Anesthesia Plan  ASA: IV  Anesthesia Plan: General   Post-op Pain Management:    Induction: Intravenous  PONV Risk Score and Plan: 3 and Ondansetron, Dexamethasone and Treatment may vary due to age or medical condition  Airway Management Planned: LMA  Additional Equipment:   Intra-op Plan:   Post-operative Plan: Extubation in OR  Informed Consent: I have reviewed the patients History and Physical, chart, labs and discussed the procedure including the risks, benefits and alternatives for the proposed anesthesia with the patient or authorized representative who has indicated his/her understanding and acceptance.     Dental advisory given  Plan Discussed with: CRNA  Anesthesia Plan Comments: (Pt recently admitted 11/27-11/29 for viral gastroenteritis. Per discharges summary, "The  ED patient was tachycardic in the 130s.  Tachycardia resolved with restarting patient's home medication and slow 250 mL bolus.  Troponin ranged 148 -176.  Cardiology was curbsided and reported elevated troponin likely due to demand ischemia (likely secondary to tachycardia and/or hypovolemia). Heart rate was controlled with home medication prior to discharge."  Will need DOS labs and eval  EKG 04/03/19: NSR. Rate 100. LVH with repol abnormality. Prolonged QT (QTc 505).  TTE 02/05/19:  1. Left ventricular ejection fraction, by visual estimation, is 60 to 65%. The left ventricle has normal function. There is moderately increased left ventricular hypertrophy.  2. Left ventricular diastolic Doppler parameters are consistent with impaired relaxation pattern of LV diastolic filling.  3. Global right ventricle has normal systolic function.The right ventricular size is normal. No increase in right ventricular wall thickness.  4. Left atrial size was normal.  5. Right atrial size was normal.  6. The pericardial effusion is localized near the right ventricle.  7. Trivial pericardial effusion is present.  8. The mitral valve is grossly normal. No evidence of mitral valve regurgitation.  9. The tricuspid valve is grossly normal. Tricuspid valve regurgitation is mild. 10. The aortic valve is tricuspid Aortic valve regurgitation was not visualized by color flow Doppler. 11. The pulmonic valve was grossly normal. Pulmonic valve regurgitation is not visualized by color flow Doppler. 12. Moderately elevated pulmonary artery systolic pressure. 13. The inferior vena cava is dilated in size with <50% respiratory variability, suggesting right atrial pressure of 15 mmHg.)       Anesthesia Quick Evaluation

## 2019-04-05 NOTE — Progress Notes (Signed)
Anesthesia Chart Review: Same day workup  Pt recently admitted 11/27-11/29 for viral gastroenteritis. Per discharges summary, "The ED patient was tachycardic in the 130s.  Tachycardia resolved with restarting patient's home medication and slow 250 mL bolus.  Troponin ranged 148 -176.  Cardiology was curbsided and reported elevated troponin likely due to demand ischemia (likely secondary to tachycardia and/or hypovolemia). Heart rate was controlled with home medication prior to discharge."  Will need DOS labs and eval  EKG 04/03/19: NSR. Rate 100. LVH with repol abnormality. Prolonged QT (QTc 505).  TTE 02/05/19:  1. Left ventricular ejection fraction, by visual estimation, is 60 to 65%. The left ventricle has normal function. There is moderately increased left ventricular hypertrophy.  2. Left ventricular diastolic Doppler parameters are consistent with impaired relaxation pattern of LV diastolic filling.  3. Global right ventricle has normal systolic function.The right ventricular size is normal. No increase in right ventricular wall thickness.  4. Left atrial size was normal.  5. Right atrial size was normal.  6. The pericardial effusion is localized near the right ventricle.  7. Trivial pericardial effusion is present.  8. The mitral valve is grossly normal. No evidence of mitral valve regurgitation.  9. The tricuspid valve is grossly normal. Tricuspid valve regurgitation is mild. 10. The aortic valve is tricuspid Aortic valve regurgitation was not visualized by color flow Doppler. 11. The pulmonic valve was grossly normal. Pulmonic valve regurgitation is not visualized by color flow Doppler. 12. Moderately elevated pulmonary artery systolic pressure. 13. The inferior vena cava is dilated in size with <50% respiratory variability, suggesting right atrial pressure of 15 mmHg.   Wynonia Musty [facility] Short Stay Center/Anesthesiology Phone [phone] 04/05/2019 1:43 PM'

## 2019-04-06 ENCOUNTER — Encounter (HOSPITAL_COMMUNITY)
Admission: RE | Disposition: A | Discharge status: Home / Self Care | Payer: Self-pay | Source: Home / Self Care | Attending: Vascular Surgery

## 2019-04-06 ENCOUNTER — Ambulatory Visit (HOSPITAL_COMMUNITY): Payer: BC Managed Care – PPO | Admitting: Physician Assistant

## 2019-04-06 ENCOUNTER — Encounter (HOSPITAL_COMMUNITY): Payer: Self-pay | Admitting: *Deleted

## 2019-04-06 ENCOUNTER — Other Ambulatory Visit: Payer: Self-pay

## 2019-04-06 ENCOUNTER — Ambulatory Visit (HOSPITAL_COMMUNITY)
Admission: RE | Admit: 2019-04-06 | Discharge: 2019-04-06 | Disposition: A | Discharge status: Home / Self Care | Payer: BC Managed Care – PPO | Attending: Vascular Surgery | Admitting: Vascular Surgery

## 2019-04-06 DIAGNOSIS — N186 End stage renal disease: Secondary | ICD-10-CM | POA: Insufficient documentation

## 2019-04-06 DIAGNOSIS — Z79899 Other long term (current) drug therapy: Secondary | ICD-10-CM | POA: Diagnosis not present

## 2019-04-06 DIAGNOSIS — N185 Chronic kidney disease, stage 5: Secondary | ICD-10-CM

## 2019-04-06 DIAGNOSIS — Z7982 Long term (current) use of aspirin: Secondary | ICD-10-CM | POA: Insufficient documentation

## 2019-04-06 DIAGNOSIS — Z992 Dependence on renal dialysis: Secondary | ICD-10-CM | POA: Diagnosis not present

## 2019-04-06 HISTORY — DX: Presence of dental prosthetic device (complete) (partial): Z97.2

## 2019-04-06 HISTORY — PX: FISTULA SUPERFICIALIZATION: SHX6341

## 2019-04-06 LAB — GLUCOSE, CAPILLARY
Glucose-Capillary: 172 mg/dL — ABNORMAL HIGH (ref 70–99)
Glucose-Capillary: 156 mg/dL — ABNORMAL HIGH (ref 70–99)

## 2019-04-06 LAB — POCT I-STAT, CHEM 8
BUN: 30 mg/dL — ABNORMAL HIGH (ref 6–20)
Calcium, Ion: 1.12 mmol/L — ABNORMAL LOW (ref 1.15–1.40)
Chloride: 97 mmol/L — ABNORMAL LOW (ref 98–111)
Creatinine, Ser: 7.4 mg/dL — ABNORMAL HIGH (ref 0.44–1.00)
Glucose, Bld: 178 mg/dL — ABNORMAL HIGH (ref 70–99)
HCT: 37 % (ref 36.0–46.0)
Hemoglobin: 12.6 g/dL (ref 12.0–15.0)
Potassium: 4 mmol/L (ref 3.5–5.1)
Sodium: 136 mmol/L (ref 135–145)
TCO2: 28 mmol/L (ref 22–32)

## 2019-04-06 SURGERY — FISTULA SUPERFICIALIZATION
Anesthesia: General | Laterality: Left

## 2019-04-06 MED ORDER — MIDAZOLAM HCL 2 MG/2ML IJ SOLN
INTRAMUSCULAR | Status: AC
  Filled 2019-04-06: qty 2

## 2019-04-06 MED ORDER — MIDAZOLAM HCL 5 MG/5ML IJ SOLN
INTRAMUSCULAR | Status: DC | PRN
  Administered 2019-04-06 (×2): 1 mg via INTRAVENOUS

## 2019-04-06 MED ORDER — CEFAZOLIN SODIUM-DEXTROSE 2-4 GM/100ML-% IV SOLN
2.0000 g | INTRAVENOUS | Status: AC
  Administered 2019-04-06: 2 g via INTRAVENOUS
  Filled 2019-04-06: qty 100

## 2019-04-06 MED ORDER — SODIUM CHLORIDE 0.9 % IV SOLN
INTRAVENOUS | Status: AC
  Filled 2019-04-06: qty 1.2

## 2019-04-06 MED ORDER — SODIUM CHLORIDE 0.9 % IV SOLN
INTRAVENOUS | Status: DC
  Administered 2019-04-06: 11:00:00 via INTRAVENOUS
  Administered 2019-04-06: 500 mL via INTRAVENOUS

## 2019-04-06 MED ORDER — LIDOCAINE 2% (20 MG/ML) 5 ML SYRINGE
INTRAMUSCULAR | Status: DC | PRN
  Administered 2019-04-06: 60 mg via INTRAVENOUS

## 2019-04-06 MED ORDER — FENTANYL CITRATE (PF) 100 MCG/2ML IJ SOLN
25.0000 ug | INTRAMUSCULAR | Status: DC | PRN
  Administered 2019-04-06: 25 ug via INTRAVENOUS

## 2019-04-06 MED ORDER — SODIUM CHLORIDE 0.9 % IV SOLN: INTRAVENOUS | Status: DC

## 2019-04-06 MED ORDER — EPINEPHRINE PF 1 MG/ML IJ SOLN
INTRAMUSCULAR | Status: AC
  Filled 2019-04-06: qty 1

## 2019-04-06 MED ORDER — PROMETHAZINE HCL 25 MG/ML IJ SOLN: 6.2500 mg | INTRAMUSCULAR | Status: DC | PRN

## 2019-04-06 MED ORDER — ONDANSETRON HCL 4 MG/2ML IJ SOLN
INTRAMUSCULAR | Status: DC | PRN
  Administered 2019-04-06: 4 mg via INTRAVENOUS

## 2019-04-06 MED ORDER — TRAMADOL HCL 50 MG PO TABS: 50.0000 mg | ORAL_TABLET | Freq: Four times a day (QID) | ORAL | 0 refills | Status: DC | PRN

## 2019-04-06 MED ORDER — PHENYLEPHRINE HCL-NACL 10-0.9 MG/250ML-% IV SOLN
INTRAVENOUS | Status: DC | PRN
  Administered 2019-04-06: 25 ug/min via INTRAVENOUS

## 2019-04-06 MED ORDER — BUPIVACAINE HCL (PF) 0.5 % IJ SOLN
INTRAMUSCULAR | Status: AC
  Filled 2019-04-06: qty 30

## 2019-04-06 MED ORDER — PROPOFOL 10 MG/ML IV BOLUS
INTRAVENOUS | Status: DC | PRN
  Administered 2019-04-06: 120 mg via INTRAVENOUS

## 2019-04-06 MED ORDER — FENTANYL CITRATE (PF) 100 MCG/2ML IJ SOLN
INTRAMUSCULAR | Status: AC
  Filled 2019-04-06: qty 2

## 2019-04-06 MED ORDER — PHENYLEPHRINE 40 MCG/ML (10ML) SYRINGE FOR IV PUSH (FOR BLOOD PRESSURE SUPPORT)
PREFILLED_SYRINGE | INTRAVENOUS | Status: DC | PRN
  Administered 2019-04-06: 40 ug via INTRAVENOUS
  Administered 2019-04-06: 80 ug via INTRAVENOUS

## 2019-04-06 MED ORDER — BUPIVACAINE HCL (PF) 0.5 % IJ SOLN
INTRAMUSCULAR | Status: DC | PRN
  Administered 2019-04-06: 30 mL

## 2019-04-06 MED ORDER — SODIUM CHLORIDE 0.9 % IV SOLN
INTRAVENOUS | Status: DC | PRN
  Administered 2019-04-06: 500 mL

## 2019-04-06 MED ORDER — FENTANYL CITRATE (PF) 250 MCG/5ML IJ SOLN
INTRAMUSCULAR | Status: AC
  Filled 2019-04-06: qty 5

## 2019-04-06 MED ORDER — FENTANYL CITRATE (PF) 100 MCG/2ML IJ SOLN
INTRAMUSCULAR | Status: DC | PRN
  Administered 2019-04-06 (×2): 25 ug via INTRAVENOUS
  Administered 2019-04-06: 50 ug via INTRAVENOUS

## 2019-04-06 MED ORDER — 0.9 % SODIUM CHLORIDE (POUR BTL) OPTIME
TOPICAL | Status: DC | PRN
  Administered 2019-04-06: 1000 mL

## 2019-04-06 SURGICAL SUPPLY — 37 items
ADH SKN CLS APL DERMABOND .7 (GAUZE/BANDAGES/DRESSINGS) ×1
ARMBAND PINK RESTRICT EXTREMIT (MISCELLANEOUS) ×3 IMPLANT
CANISTER SUCT 3000ML PPV (MISCELLANEOUS) ×3 IMPLANT
CANNULA VESSEL 3MM 2 BLNT TIP (CANNULA) ×1 IMPLANT
CLIP VESOCCLUDE MED 6/CT (CLIP) ×3 IMPLANT
CLIP VESOCCLUDE SM WIDE 6/CT (CLIP) ×3 IMPLANT
COVER PROBE W GEL 5X96 (DRAPES) ×3 IMPLANT
COVER WAND RF STERILE (DRAPES) ×1 IMPLANT
DECANTER SPIKE VIAL GLASS SM (MISCELLANEOUS) ×1 IMPLANT
DERMABOND ADVANCED (GAUZE/BANDAGES/DRESSINGS) ×2
DERMABOND ADVANCED .7 DNX12 (GAUZE/BANDAGES/DRESSINGS) ×1 IMPLANT
ELECT REM PT RETURN 9FT ADLT (ELECTROSURGICAL) ×3
ELECTRODE REM PT RTRN 9FT ADLT (ELECTROSURGICAL) ×1 IMPLANT
GLOVE BIO SURGEON STRL SZ7.5 (GLOVE) ×3 IMPLANT
GLOVE BIOGEL PI IND STRL 6.5 (GLOVE) IMPLANT
GLOVE BIOGEL PI IND STRL 7.5 (GLOVE) IMPLANT
GLOVE BIOGEL PI IND STRL 8 (GLOVE) ×1 IMPLANT
GLOVE BIOGEL PI INDICATOR 6.5 (GLOVE) ×2
GLOVE BIOGEL PI INDICATOR 7.5 (GLOVE) ×2
GLOVE BIOGEL PI INDICATOR 8 (GLOVE) ×2
GLOVE ECLIPSE 7.0 STRL STRAW (GLOVE) ×4 IMPLANT
GOWN STRL REUS W/ TWL LRG LVL3 (GOWN DISPOSABLE) ×3 IMPLANT
GOWN STRL REUS W/TWL LRG LVL3 (GOWN DISPOSABLE) ×6
KIT BASIN OR (CUSTOM PROCEDURE TRAY) ×3 IMPLANT
KIT TURNOVER KIT B (KITS) ×3 IMPLANT
NS IRRIG 1000ML POUR BTL (IV SOLUTION) ×3 IMPLANT
PACK CV ACCESS (CUSTOM PROCEDURE TRAY) ×3 IMPLANT
PAD ARMBOARD 7.5X6 YLW CONV (MISCELLANEOUS) ×6 IMPLANT
SPONGE SURGIFOAM ABS GEL 100 (HEMOSTASIS) IMPLANT
SUT MNCRL AB 4-0 PS2 18 (SUTURE) ×4 IMPLANT
SUT PROLENE 6 0 BV (SUTURE) ×5 IMPLANT
SUT VIC AB 3-0 SH 27 (SUTURE) ×3
SUT VIC AB 3-0 SH 27X BRD (SUTURE) ×1 IMPLANT
SUT VICRYL 4-0 PS2 18IN ABS (SUTURE) ×3 IMPLANT
TOWEL GREEN STERILE (TOWEL DISPOSABLE) ×3 IMPLANT
UNDERPAD 30X30 (UNDERPADS AND DIAPERS) ×3 IMPLANT
WATER STERILE IRR 1000ML POUR (IV SOLUTION) ×3 IMPLANT

## 2019-04-06 NOTE — Anesthesia Procedure Notes (Signed)
Procedure Name: LMA Insertion Date/Time: 04/06/2019 9:43 AM Performed by: Dmitriy Gair T, CRNA Pre-anesthesia Checklist: Patient identified, Emergency Drugs available, Suction available and Patient being monitored Patient Re-evaluated:Patient Re-evaluated prior to induction Oxygen Delivery Method: Circle system utilized Preoxygenation: Pre-oxygenation with 100% oxygen Induction Type: IV induction LMA Size: 5.0 Number of attempts: 1 Airway Equipment and Method: Patient positioned with wedge pillow and Stylet Placement Confirmation: positive ETCO2 and breath sounds checked- equal and bilateral Tube secured with: Tape Dental Injury: Teeth and Oropharynx as per pre-operative assessment

## 2019-04-06 NOTE — Discharge Instructions (Signed)
° °  [facility] ° °Discharge Instructions ° °AV Fistula or Graft Surgery for Dialysis Access ° °Please refer to the following instructions for your post-procedure care. Your surgeon or physician assistant will discuss any changes with you. ° °Activity ° °You may drive the day following your surgery, if you are comfortable and no longer taking prescription pain medication. Resume full activity as the soreness in your incision resolves. ° °Bathing/Showering ° °You may shower after you go home. Keep your incision dry for 48 hours. Do not soak in a bathtub, hot tub, or swim until the incision heals completely. You may not shower if you have a hemodialysis catheter. ° °Incision Care ° °Clean your incision with mild soap and water after 48 hours. Pat the area dry with a clean towel. You do not need a bandage unless otherwise instructed. Do not apply any ointments or creams to your incision. You may have skin glue on your incision. Do not peel it off. It will come off on its own in about one week. Your arm may swell a bit after surgery. To reduce swelling use pillows to elevate your arm so it is above your heart. Your doctor will tell you if you need to lightly wrap your arm with an ACE bandage. ° °Diet ° °Resume your normal diet. There are not special food restrictions following this procedure. In order to heal from your surgery, it is CRITICAL to get adequate nutrition. Your body requires vitamins, minerals, and protein. Vegetables are the best source of vitamins and minerals. Vegetables also provide the perfect balance of protein. Processed food has little nutritional value, so try to avoid this. ° °Medications ° °Resume taking all of your medications. If your incision is causing pain, you may take over-the counter pain relievers such as acetaminophen (Tylenol). If you were prescribed a stronger pain medication, please be aware these medications can cause nausea and constipation. Prevent  nausea by taking the medication with a snack or meal. Avoid constipation by drinking plenty of fluids and eating foods with high amount of fiber, such as fruits, vegetables, and grains. Do not take Tylenol if you are taking prescription pain medications. ° ° ° ° °Follow up °Your surgeon may want to see you in the office following your access surgery. If so, this will be arranged at the time of your surgery. ° °Please call us immediately for any of the following conditions: ° °Increased pain, redness, drainage (pus) from your incision site °Fever of 101 degrees or higher °Severe or worsening pain at your incision site °Hand pain or numbness. ° °Reduce your risk of vascular disease: ° °Stop smoking. If you would like help, call [facility] at 1-800-QUIT-NOW (1-800-784-8669) or [facility] at 336-586-4000 ° °Manage your cholesterol °Maintain a desired weight °Control your diabetes °Keep your blood pressure down ° °Dialysis ° °It will take several weeks to several months for your new dialysis access to be ready for use. Your surgeon will determine when it is OK to use it. Your nephrologist will continue to direct your dialysis. You can continue to use your Permcath until your new access is ready for use. ° °If you have any questions, please call the office at [phone]. ° °

## 2019-04-06 NOTE — Progress Notes (Signed)
Dr Kalman Shan, Anesthesia was informed of patient's bp 83/52 in SS this morning.  No orders given.  [place] for surgery.

## 2019-04-06 NOTE — H&P (Signed)
   History and Physical Update  The patient was interviewed and re-examined.  The patient's previous History and Physical has been reviewed and is unchanged from recent office visit.  Plan for superficial Tatian versus transposition of left arm cephalic vein AV fistula.  I discussed the risk benefits and alternatives and she agrees to proceed.  Julienne Vogler C. Donzetta Matters, MD [facility] Office: [phone] Pager: [phone]    04/06/2019, 8:36 AM

## 2019-04-06 NOTE — Op Note (Addendum)
    Patient name: [name] MRN: [id number] DOB: 31-Jan-1961 Sex: female  04/06/2019 Pre-operative Diagnosis: esrd Post-operative diagnosis:  Same Surgeon:  Erlene Quan C. Donzetta Matters, MD Assistant: Laurence Slate, PA Procedure Performed: Transposition of left arm cephalic vein AV fistula  Indications: 58 year old female with history of esrd.  She has a left arm AV fistula which is too deep for use.  She is indicated for some visualization versus transposition.  Findings: Fistula was quite patulous near the antecubitum.  We are able to tunnel laterally and a subcutaneous plane.  At completion was a strong thrill and a palpable radial pulse the wrist.   Procedure:  The patient was identified in the holding area and taken to the operating where she is placed upon the operating table.  General anesthesia was induced.  She was sterilely prepped draped left upper extremity usual fashion antibiotics were administered timeout was called.  Ultrasound was used to identify the AV fistula.  2 separate incisions were made overlying the fistula in the upper arm.  We dissected out the fistula divided branches tween clips and ties.  We dissected back through the antecubitum near the anastomosis.  Fistula is more for orientation.  Clear antecubitum divided.  I tunneled it laterally.  It was flushed with heparinized saline.  I spatulated both ends and sewed them end-to-end with 6-0 Prolene suture.  Prior to completion we allowed flushing all next.  Upon completion there was good thrill in the fistula.  We did free up some soft tissue to allow to see well.  There was a palpable radial pulse at the wrist.  Doppler was used to confirm flow in both.  We irrigated the wounds obtain hemostasis closed the skin overlying the fistula for Monocryl.  Dermabond is placed above that.  She was awakened anesthesia having tolerated procedure well that immediate complication but all counts were correct at completion.  EBL: 20 cc   Momoko Slezak C.  Donzetta Matters, MD [facility] Office: [phone] Pager: [phone]

## 2019-04-06 NOTE — Transfer of Care (Signed)
Immediate Anesthesia Transfer of Care Note  Patient: ANIVEA VELASQUES  Procedure(s) Performed: FISTULA SUPERFICIALIZATION LEFT BRACHIOCEPHALIC (Left )  Patient Location: PACU  Anesthesia Type:General  Level of Consciousness: awake, alert  and oriented  Airway & Oxygen Therapy: Patient Spontanous Breathing and Patient connected to nasal cannula oxygen  Post-op Assessment: Report given to RN, Post -op Vital signs reviewed and stable and Patient moving all extremities  Post vital signs: Reviewed and stable  Last Vitals:  Vitals Value Taken Time  BP    Temp    Pulse 76 04/06/19 1116  Resp 25 04/06/19 1116  SpO2 100 % 04/06/19 1116  Vitals shown include unvalidated device data.  Last Pain:  Vitals:   04/06/19 1112  TempSrc:   PainSc: (P) 0-No pain      Patients Stated Pain Goal: 2 (18/40/37 5436)  Complications: No apparent anesthesia complications

## 2019-04-06 NOTE — Anesthesia Postprocedure Evaluation (Signed)
Anesthesia Post Note  Patient: [name]  Procedure(s) Performed: FISTULA SUPERFICIALIZATION LEFT BRACHIOCEPHALIC (Left )     Patient location during evaluation: PACU Anesthesia Type: General Level of consciousness: awake and alert Pain management: pain level controlled Vital Signs Assessment: post-procedure vital signs reviewed and stable Respiratory status: spontaneous breathing, nonlabored ventilation, respiratory function stable and patient connected to nasal cannula oxygen Cardiovascular status: blood pressure returned to baseline and stable Postop Assessment: no apparent nausea or vomiting Anesthetic complications: no    Last Vitals:  Vitals:   04/06/19 1126 04/06/19 1141  BP: (!) 117/38 (!) 115/45  Pulse: 78 80  Resp: (!) 22 (!) 29  Temp:    SpO2: 100% 94%    Last Pain:  Vitals:   04/06/19 1141  TempSrc:   PainSc: 5                  Ronit Marczak S

## 2019-04-07 ENCOUNTER — Encounter (HOSPITAL_COMMUNITY): Payer: Self-pay | Admitting: Vascular Surgery

## 2019-04-07 LAB — CULTURE, BLOOD (ROUTINE X 2)
Culture: NO GROWTH
Culture: NO GROWTH
Special Requests: ADEQUATE
Special Requests: ADEQUATE

## 2019-04-20 ENCOUNTER — Emergency Department (HOSPITAL_COMMUNITY): Payer: BC Managed Care – PPO

## 2019-04-20 ENCOUNTER — Other Ambulatory Visit: Payer: Self-pay

## 2019-04-20 ENCOUNTER — Encounter (HOSPITAL_COMMUNITY): Payer: Self-pay | Admitting: Family Medicine

## 2019-04-20 ENCOUNTER — Other Ambulatory Visit (HOSPITAL_COMMUNITY): Payer: Self-pay

## 2019-04-20 ENCOUNTER — Inpatient Hospital Stay (HOSPITAL_COMMUNITY)
Admission: EM | Admit: 2019-04-20 | Discharge: 2019-04-22 | DRG: 073 | Disposition: A | Discharge status: Home / Self Care | Payer: BC Managed Care – PPO | Attending: Family Medicine | Admitting: Family Medicine

## 2019-04-20 DIAGNOSIS — I152 Hypertension secondary to endocrine disorders: Secondary | ICD-10-CM

## 2019-04-20 DIAGNOSIS — Z91138 Patient's unintentional underdosing of medication regimen for other reason: Secondary | ICD-10-CM | POA: Diagnosis not present

## 2019-04-20 DIAGNOSIS — R112 Nausea with vomiting, unspecified: Secondary | ICD-10-CM | POA: Diagnosis not present

## 2019-04-20 DIAGNOSIS — I120 Hypertensive chronic kidney disease with stage 5 chronic kidney disease or end stage renal disease: Secondary | ICD-10-CM | POA: Diagnosis present

## 2019-04-20 DIAGNOSIS — N2581 Secondary hyperparathyroidism of renal origin: Secondary | ICD-10-CM | POA: Diagnosis present

## 2019-04-20 DIAGNOSIS — E785 Hyperlipidemia, unspecified: Secondary | ICD-10-CM | POA: Diagnosis present

## 2019-04-20 DIAGNOSIS — Z20828 Contact with and (suspected) exposure to other viral communicable diseases: Secondary | ICD-10-CM | POA: Diagnosis present

## 2019-04-20 DIAGNOSIS — I161 Hypertensive emergency: Secondary | ICD-10-CM | POA: Diagnosis present

## 2019-04-20 DIAGNOSIS — T447X6A Underdosing of beta-adrenoreceptor antagonists, initial encounter: Secondary | ICD-10-CM | POA: Diagnosis present

## 2019-04-20 DIAGNOSIS — I160 Hypertensive urgency: Secondary | ICD-10-CM

## 2019-04-20 DIAGNOSIS — E1143 Type 2 diabetes mellitus with diabetic autonomic (poly)neuropathy: Principal | ICD-10-CM | POA: Diagnosis present

## 2019-04-20 DIAGNOSIS — I158 Other secondary hypertension: Secondary | ICD-10-CM | POA: Diagnosis not present

## 2019-04-20 DIAGNOSIS — T465X6A Underdosing of other antihypertensive drugs, initial encounter: Secondary | ICD-10-CM | POA: Diagnosis present

## 2019-04-20 DIAGNOSIS — Z801 Family history of malignant neoplasm of trachea, bronchus and lung: Secondary | ICD-10-CM | POA: Diagnosis not present

## 2019-04-20 DIAGNOSIS — Z992 Dependence on renal dialysis: Secondary | ICD-10-CM | POA: Diagnosis not present

## 2019-04-20 DIAGNOSIS — Z87891 Personal history of nicotine dependence: Secondary | ICD-10-CM | POA: Diagnosis not present

## 2019-04-20 DIAGNOSIS — Z79899 Other long term (current) drug therapy: Secondary | ICD-10-CM | POA: Diagnosis not present

## 2019-04-20 DIAGNOSIS — K3184 Gastroparesis: Secondary | ICD-10-CM | POA: Diagnosis present

## 2019-04-20 DIAGNOSIS — N186 End stage renal disease: Secondary | ICD-10-CM | POA: Diagnosis present

## 2019-04-20 DIAGNOSIS — E8889 Other specified metabolic disorders: Secondary | ICD-10-CM | POA: Diagnosis present

## 2019-04-20 DIAGNOSIS — Z8249 Family history of ischemic heart disease and other diseases of the circulatory system: Secondary | ICD-10-CM

## 2019-04-20 DIAGNOSIS — I10 Essential (primary) hypertension: Secondary | ICD-10-CM

## 2019-04-20 DIAGNOSIS — Z833 Family history of diabetes mellitus: Secondary | ICD-10-CM

## 2019-04-20 DIAGNOSIS — Z7982 Long term (current) use of aspirin: Secondary | ICD-10-CM

## 2019-04-20 DIAGNOSIS — E1122 Type 2 diabetes mellitus with diabetic chronic kidney disease: Secondary | ICD-10-CM | POA: Diagnosis present

## 2019-04-20 LAB — BASIC METABOLIC PANEL
Anion gap: 23 — ABNORMAL HIGH (ref 5–15)
BUN: 71 mg/dL — ABNORMAL HIGH (ref 6–20)
CO2: 20 mmol/L — ABNORMAL LOW (ref 22–32)
Calcium: 10 mg/dL (ref 8.9–10.3)
Chloride: 97 mmol/L — ABNORMAL LOW (ref 98–111)
Creatinine, Ser: 13.47 mg/dL — ABNORMAL HIGH (ref 0.44–1.00)
GFR calc Af Amer: 3 mL/min — ABNORMAL LOW (ref >60)
GFR calc non Af Amer: 3 mL/min — ABNORMAL LOW (ref >60)
Glucose, Bld: 187 mg/dL — ABNORMAL HIGH (ref 70–99)
Potassium: 4.9 mmol/L (ref 3.5–5.1)
Sodium: 140 mmol/L (ref 135–145)

## 2019-04-20 LAB — GLUCOSE, CAPILLARY
Glucose-Capillary: 130 mg/dL — ABNORMAL HIGH (ref 70–99)
Glucose-Capillary: 113 mg/dL — ABNORMAL HIGH (ref 70–99)

## 2019-04-20 LAB — CBC
HCT: 47.2 % — ABNORMAL HIGH (ref 36.0–46.0)
Hemoglobin: 14.5 g/dL (ref 12.0–15.0)
MCH: 26.2 pg (ref 26.0–34.0)
MCHC: 30.7 g/dL (ref 30.0–36.0)
MCV: 85.4 fL (ref 80.0–100.0)
Platelets: 432 10*3/uL — ABNORMAL HIGH (ref 150–400)
RBC: 5.53 MIL/uL — ABNORMAL HIGH (ref 3.87–5.11)
RDW: 14.5 % (ref 11.5–15.5)
WBC: 14.4 10*3/uL — ABNORMAL HIGH (ref 4.0–10.5)
nRBC: 0 % (ref 0.0–0.2)

## 2019-04-20 LAB — HEPATIC FUNCTION PANEL
ALT: 11 U/L (ref 0–44)
AST: 21 U/L (ref 15–41)
Albumin: 4 g/dL (ref 3.5–5.0)
Alkaline Phosphatase: 103 U/L (ref 38–126)
Bilirubin, Direct: 0.1 mg/dL (ref 0.0–0.2)
Indirect Bilirubin: 0.1 mg/dL — ABNORMAL LOW (ref 0.3–0.9)
Total Bilirubin: 0.2 mg/dL — ABNORMAL LOW (ref 0.3–1.2)
Total Protein: 8.5 g/dL — ABNORMAL HIGH (ref 6.5–8.1)

## 2019-04-20 LAB — TROPONIN I (HIGH SENSITIVITY)
Troponin I (High Sensitivity): 91 ng/L — ABNORMAL HIGH (ref <18)
Troponin I (High Sensitivity): 84 ng/L — ABNORMAL HIGH (ref <18)

## 2019-04-20 LAB — SARS CORONAVIRUS 2 (TAT 6-24 HRS): SARS Coronavirus 2: NEGATIVE

## 2019-04-20 LAB — LIPASE, BLOOD: Lipase: 83 U/L — ABNORMAL HIGH (ref 11–51)

## 2019-04-20 IMAGING — DX DG CHEST 2V
2 series · 2 of 2 positions shown · non-contrast
Comparison: Radiograph [DATE]

CLINICAL DATA: Chest pain. Nausea and vomiting.

EXAM:
CHEST - 2 VIEW

[chest pa]
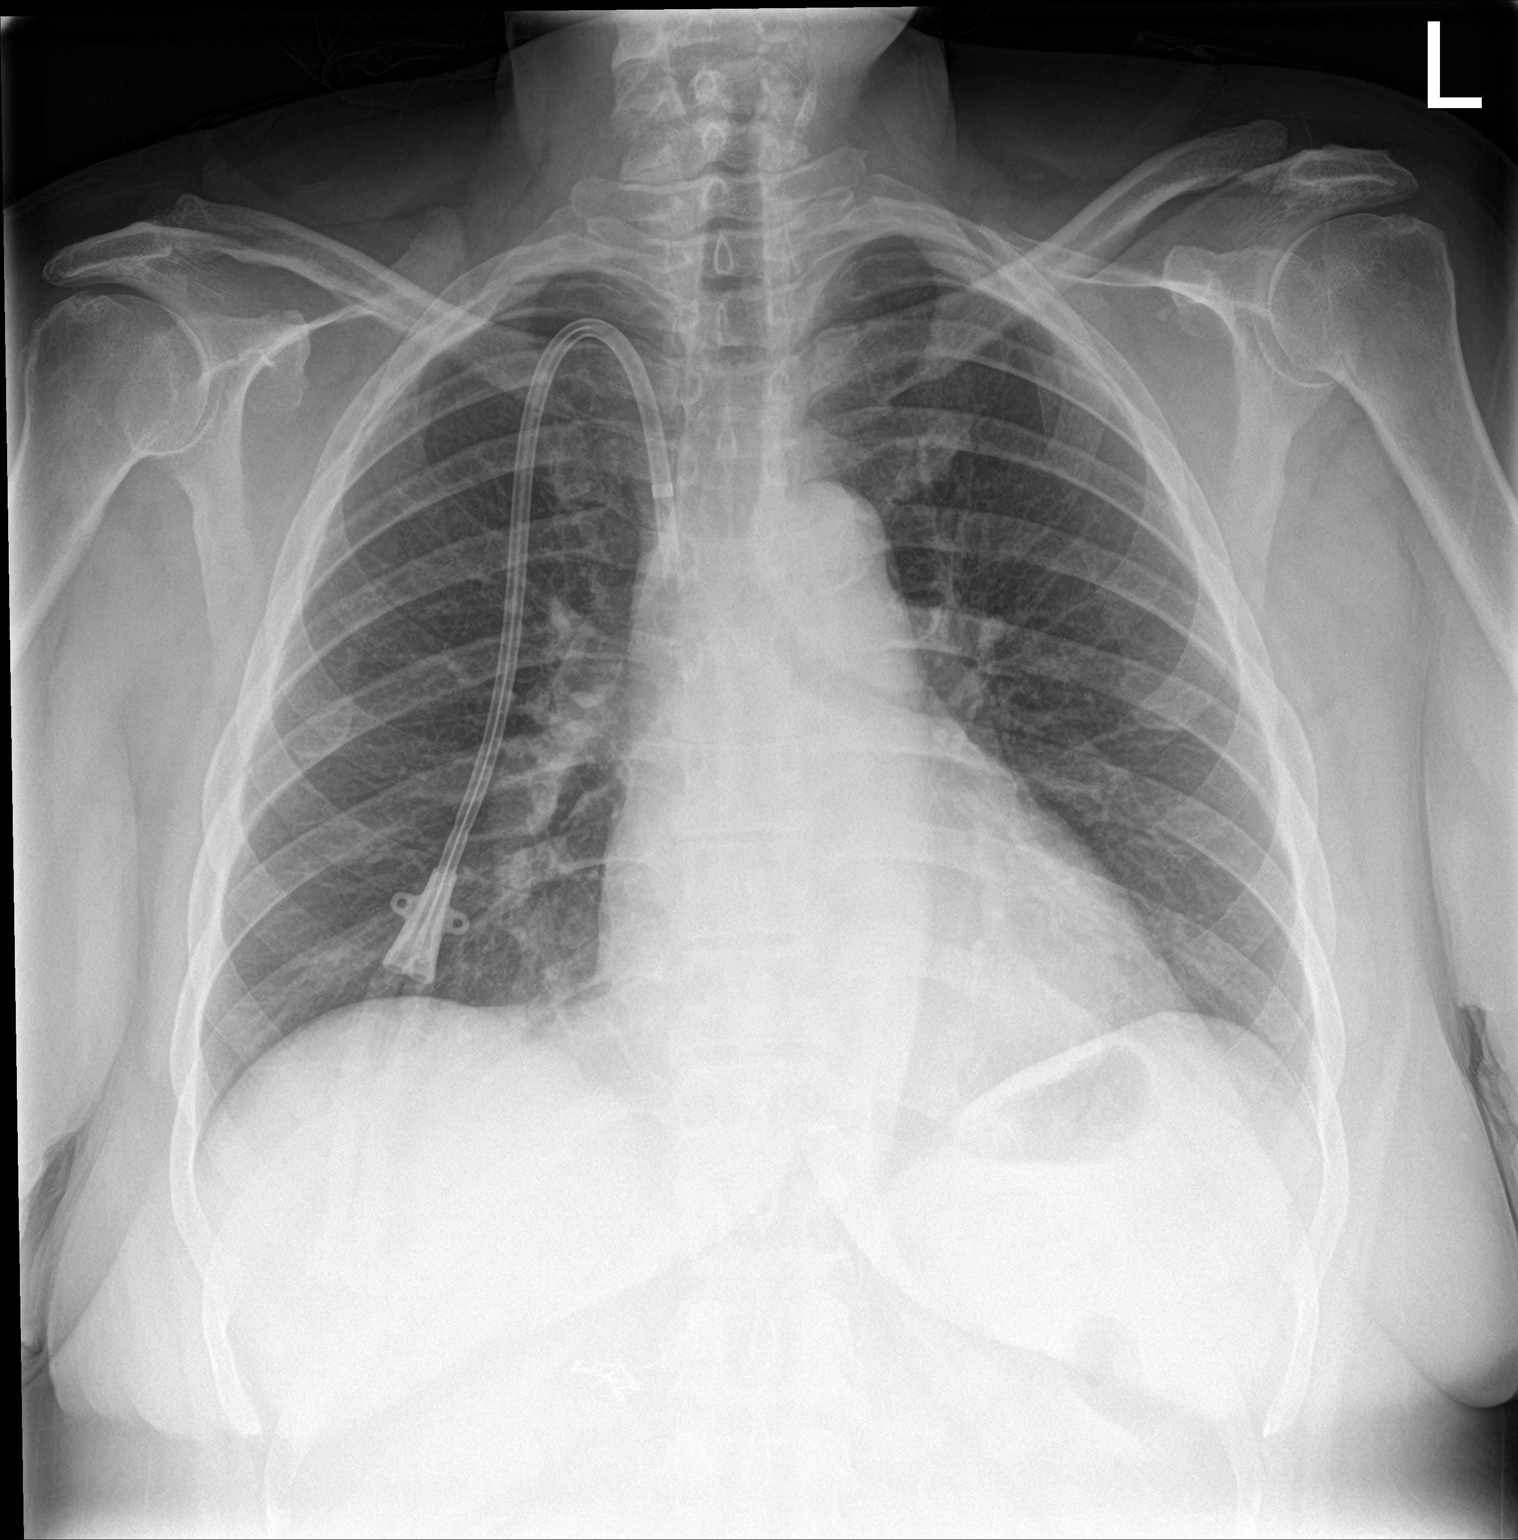

[chest lat]
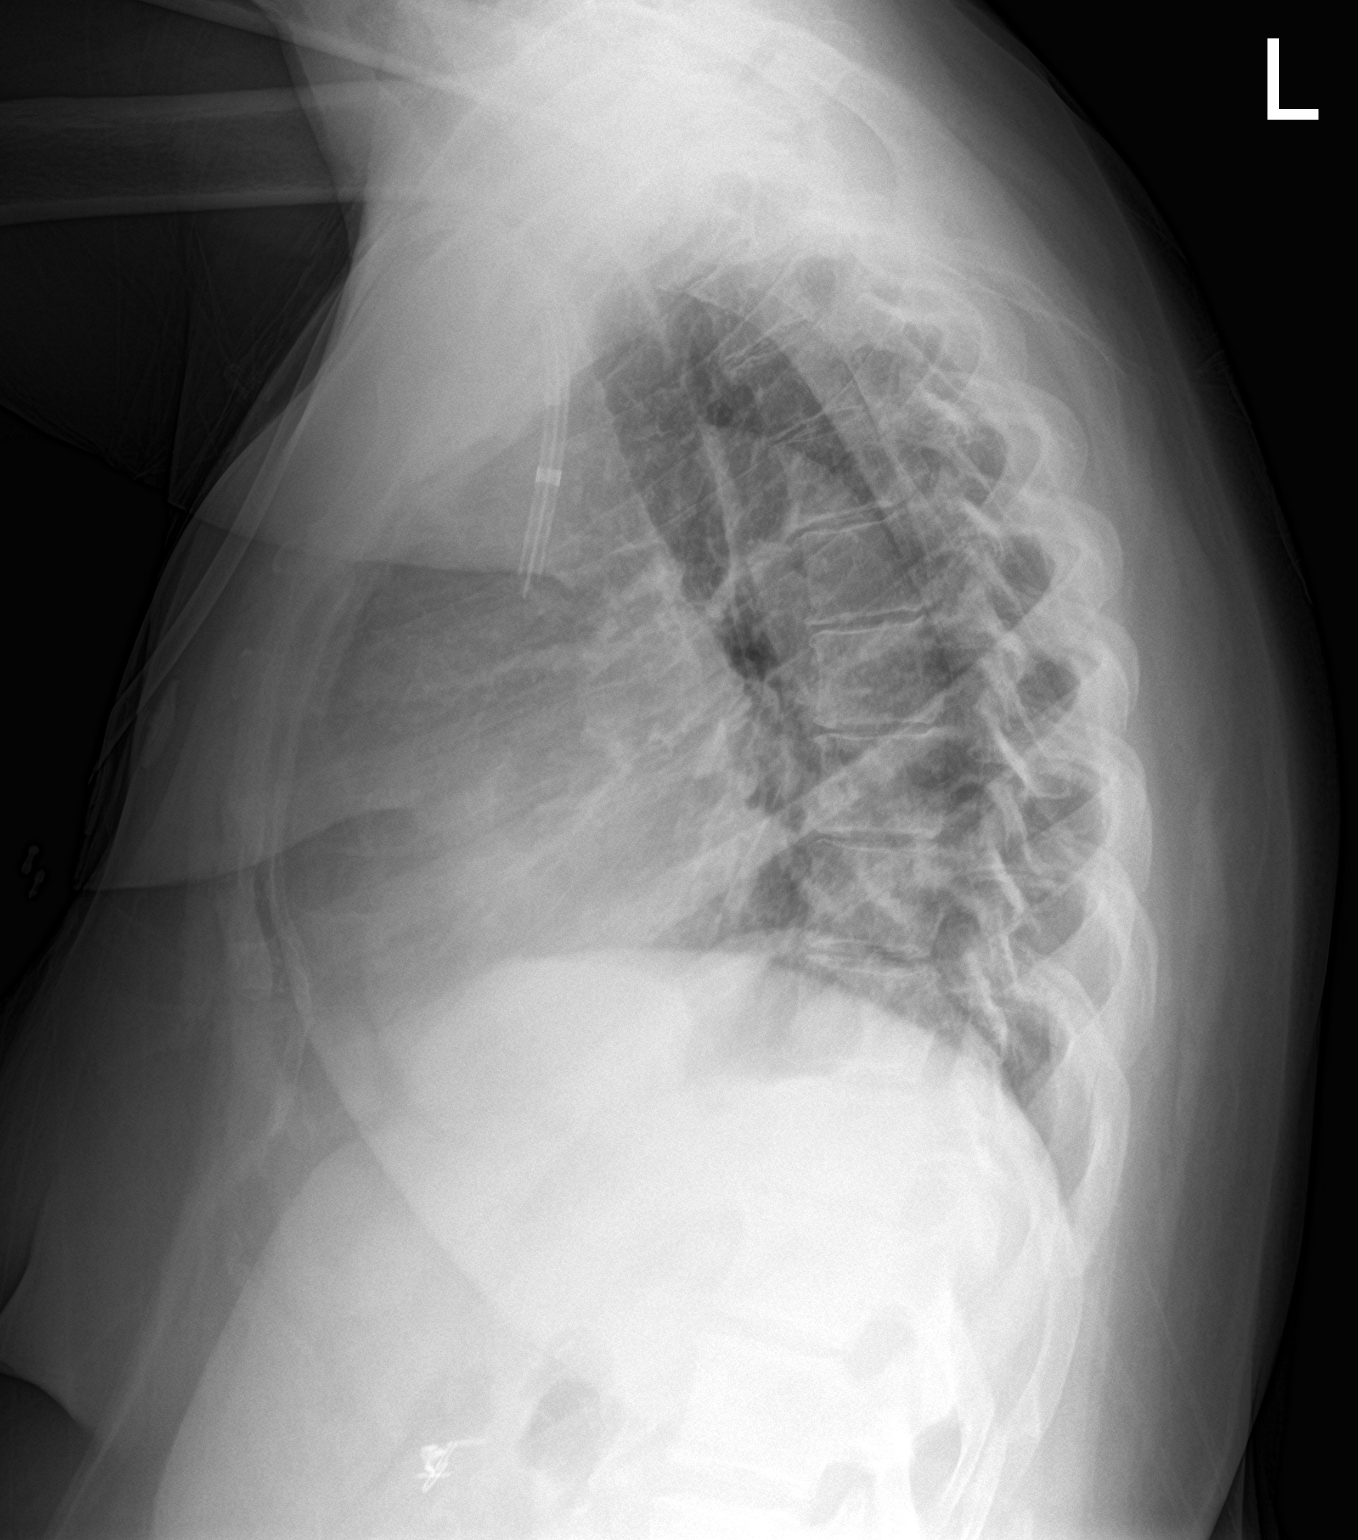

[2 of 2 positions shown; findings below may reference images not displayed]

FINDINGS: Right dialysis catheter unchanged. Mild cardiomegaly is unchanged.
No pulmonary edema or acute airspace disease. No pleural fluid or
pneumothorax. No acute osseous abnormalities.
IMPRESSION: Stable mild cardiomegaly. No acute abnormality.

## 2019-04-20 MED ORDER — PROMETHAZINE HCL 25 MG/ML IJ SOLN
12.5000 mg | Freq: Four times a day (QID) | INTRAMUSCULAR | Status: DC | PRN
  Filled 2019-04-20: qty 1

## 2019-04-20 MED ORDER — LIDOCAINE HCL (PF) 1 % IJ SOLN: 5.0000 mL | INTRAMUSCULAR | Status: DC | PRN

## 2019-04-20 MED ORDER — INSULIN ASPART 100 UNIT/ML ~~LOC~~ SOLN
0.0000 [IU] | Freq: Three times a day (TID) | SUBCUTANEOUS | Status: DC
  Administered 2019-04-20 – 2019-04-21 (×2): 1 [IU] via SUBCUTANEOUS
  Administered 2019-04-21: 2 [IU] via SUBCUTANEOUS
  Administered 2019-04-22: 17:00:00 1 [IU] via SUBCUTANEOUS
  Administered 2019-04-22: 2 [IU] via SUBCUTANEOUS
  Administered 2019-04-22: 07:00:00 1 [IU] via SUBCUTANEOUS

## 2019-04-20 MED ORDER — SODIUM CHLORIDE 0.9 % IV SOLN: 100.0000 mL | INTRAVENOUS | Status: DC | PRN

## 2019-04-20 MED ORDER — ONDANSETRON HCL 4 MG/2ML IJ SOLN
4.0000 mg | Freq: Once | INTRAMUSCULAR | Status: AC
  Administered 2019-04-20: 4 mg via INTRAVENOUS
  Filled 2019-04-20: qty 2

## 2019-04-20 MED ORDER — SODIUM CHLORIDE 0.9 % IV BOLUS
500.0000 mL | Freq: Once | INTRAVENOUS | Status: AC
  Administered 2019-04-20: 500 mL via INTRAVENOUS

## 2019-04-20 MED ORDER — CHLORHEXIDINE GLUCONATE CLOTH 2 % EX PADS
6.0000 | MEDICATED_PAD | Freq: Every day | CUTANEOUS | Status: DC
  Administered 2019-04-21 – 2019-04-22 (×2): 6 via TOPICAL

## 2019-04-20 MED ORDER — LIDOCAINE-PRILOCAINE 2.5-2.5 % EX CREA: 1.0000 "application " | TOPICAL_CREAM | CUTANEOUS | Status: DC | PRN

## 2019-04-20 MED ORDER — ALTEPLASE 2 MG IJ SOLR: 2.0000 mg | Freq: Once | INTRAMUSCULAR | Status: DC | PRN

## 2019-04-20 MED ORDER — HEPARIN SODIUM (PORCINE) 1000 UNIT/ML DIALYSIS: 1000.0000 [IU] | INTRAMUSCULAR | Status: DC | PRN

## 2019-04-20 MED ORDER — PENTAFLUOROPROP-TETRAFLUOROETH EX AERO: 1.0000 "application " | INHALATION_SPRAY | CUTANEOUS | Status: DC | PRN

## 2019-04-20 MED ORDER — HEPARIN SODIUM (PORCINE) 5000 UNIT/ML IJ SOLN
5000.0000 [IU] | Freq: Three times a day (TID) | INTRAMUSCULAR | Status: DC
  Administered 2019-04-20 – 2019-04-22 (×7): 5000 [IU] via SUBCUTANEOUS
  Filled 2019-04-20 (×7): qty 1

## 2019-04-20 MED ORDER — PROMETHAZINE HCL 25 MG/ML IJ SOLN
12.5000 mg | Freq: Once | INTRAMUSCULAR | Status: AC
  Administered 2019-04-20: 12.5 mg via INTRAVENOUS
  Filled 2019-04-20: qty 1

## 2019-04-20 MED ORDER — CHLORHEXIDINE GLUCONATE CLOTH 2 % EX PADS: 6.0000 | MEDICATED_PAD | Freq: Every day | CUTANEOUS | Status: DC

## 2019-04-20 MED ORDER — INSULIN ASPART 100 UNIT/ML ~~LOC~~ SOLN: 0.0000 [IU] | Freq: Every day | SUBCUTANEOUS | Status: DC

## 2019-04-20 MED ORDER — ENOXAPARIN SODIUM 30 MG/0.3ML ~~LOC~~ SOLN: 30.0000 mg | SUBCUTANEOUS | Status: DC

## 2019-04-20 MED ORDER — LABETALOL HCL 5 MG/ML IV SOLN
5.0000 mg | INTRAVENOUS | Status: DC | PRN
  Administered 2019-04-20 (×2): 5 mg via INTRAVENOUS
  Filled 2019-04-20 (×2): qty 4

## 2019-04-20 MED ORDER — HYDRALAZINE HCL 20 MG/ML IJ SOLN: 10.0000 mg | Freq: Four times a day (QID) | INTRAMUSCULAR | Status: DC | PRN

## 2019-04-20 MED ORDER — HEPARIN SODIUM (PORCINE) 1000 UNIT/ML IJ SOLN
INTRAMUSCULAR | Status: AC
  Filled 2019-04-20: qty 1

## 2019-04-20 MED ORDER — HEPARIN SODIUM (PORCINE) 1000 UNIT/ML DIALYSIS: 20.0000 [IU]/kg | INTRAMUSCULAR | Status: DC | PRN

## 2019-04-20 MED ORDER — SODIUM CHLORIDE 0.9 % IV BOLUS
1000.0000 mL | Freq: Once | INTRAVENOUS | Status: AC
  Administered 2019-04-20: 09:00:00 1000 mL via INTRAVENOUS

## 2019-04-20 MED ORDER — LABETALOL HCL 5 MG/ML IV SOLN
10.0000 mg | Freq: Once | INTRAVENOUS | Status: AC
  Administered 2019-04-20: 10 mg via INTRAVENOUS
  Filled 2019-04-20: qty 4

## 2019-04-20 MED ORDER — CLONIDINE HCL 0.1 MG/24HR TD PTWK
0.1000 mg | MEDICATED_PATCH | TRANSDERMAL | Status: DC
  Administered 2019-04-20: 0.1 mg via TRANSDERMAL
  Filled 2019-04-20: qty 1

## 2019-04-20 MED ORDER — PANTOPRAZOLE SODIUM 40 MG IV SOLR
40.0000 mg | Freq: Once | INTRAVENOUS | Status: AC
  Administered 2019-04-20: 09:00:00 40 mg via INTRAVENOUS
  Filled 2019-04-20: qty 40

## 2019-04-20 MED ORDER — HYDRALAZINE HCL 20 MG/ML IJ SOLN
10.0000 mg | Freq: Once | INTRAMUSCULAR | Status: AC
  Administered 2019-04-20: 10 mg via INTRAVENOUS
  Filled 2019-04-20: qty 1

## 2019-04-20 MED ORDER — LABETALOL HCL 5 MG/ML IV SOLN
20.0000 mg | Freq: Four times a day (QID) | INTRAVENOUS | Status: DC
  Administered 2019-04-20: 20 mg via INTRAVENOUS

## 2019-04-20 NOTE — H&P (Signed)
[facility] Admission History and Physical Service Pager: [phone]  Patient name: [name] Medical record number: [id number] Date of birth: 07-Sep-1960 Age: 58 y.o. Gender: female  Primary Care Provider: Matilde Haymaker, MD Consultants: nephrology Code Status: full  Chief Complaint: Nausea vomiting  Assessment and Plan: [name] is a 58 y.o. female presenting with 3-day history of nausea and vomiting unable to tolerate p.o. intake.Marland Kitchen PMH is significant for end-stage renal disease, diabetes mellitus, diabetic gastroparesis, GERD, hypertension  Intractable nausea and vomiting-has been occurring since Saturday which was 1 day after her last hemodialysis session.  Has not been able to tolerate p.o. intake or or oral medications.  No diarrhea.  Afebrile, white count of 14.  Given the timeline and lack of other signs or symptoms of volume overload unlikely it is uremia.  BUN in the 70s.  Had 1 bowel movement on Saturday.  Mild abdominal pain on the upper and lower right-sided.  Has had admissions in the past for similar complaints been diagnosed with diabetic gastroparesis.  Presentation now at most likely consistent with diabetic gastroparesis, but will get further lab work to rule out other GI causes. -Admit to inpatient, medical telemetry, Dr. Andria Frames attending. -Nephrology consulted for dialysis, will likely receive dialysis tomorrow. -Lipase, liver function panel -N.p.o. -Phenergan IV every 4 hours as needed -Continuous cardiac monitoring -Continuous pulse ox -PT/OT eval and treat -I and O -Daily weights -Daily CBC, daily renal function panel  ESRD-patient on hemodialysis, Monday Wednesday Friday schedule.  Received dialysis on Friday, but did not go to her appointment on Monday.  Electrolytes normal.  Creatinine elevated, BUN approximately 70.  Nephrology consulted and will see patient with dialysis likely starting tomorrow. -Nephrology consulted,  appreciate recs -Daily renal function panel  Tachycardia-patient has not been taking her medications for the past 3 days which include Coreg and clonidine.  Tachycardia was likely due to not taking his medications and/or withdrawal symptoms of the clonidine.  Pharmacy consulted and will hold off on giving clonidine until she can tolerate p.o.  Recommended IV hydralazine or labetalol for tachycardia and blood pressure control.  Troponin was 91 with follow-up 84.  Likely demand ischemia from tachycardia.  Not complaining of chest pain. -IV labetalol 10 mg every 4 hours as needed for tachycardia and hypertension  Hypertensive emergency-patient came in with a blood pressure of 170/122 currently 170/110.  Patient denies symptoms including: Blurry vision, headache, chest pain, right upper quadrant pain. -IV labetalol 10 mg every 4 hours -Vitals per routine -Continuous cardiac monitoring  Diabetes mellitus type 2-currently not taking any medication at home for this.  A1c on 11/4 was 5.1.  Was previously poorly controlled with A1c's over 10 in 2016 through 2019. -Sensitive sliding scale insulin with meals and nightly -CBG with meals and nightly  FEN/GI: N.p.o.  No fluids.  Prophylaxis: heparin  Disposition: Med telemetry  History of Present Illness:  [name] is a 58 y.o. female presenting with 3-day history of nausea and vomiting with inability to tolerate p.o.  This occurred on Saturday.  She received dialysis on Friday.  Symptoms have not improved during this time.  She has not tolerated p.o.  She had 1 bowel movement on Saturday.  She missed her dialysis session on Monday due to this.  Lives with her son.  Nobody she has been has been around with has been sick.  Patient does not feel like she has a fever.  No shortness of breath or  cough.  These issues have happened before, patient most recently discharged from hospital at the end of last month for similar issues.  Patient has not taking any of  her medications in the last 2 days including her blood pressure medication.  States that she had 1 transient episode of palpitations but not currently.  Review Of Systems: Per HPI with the following additions:   Review of Systems  Constitutional: Negative for chills and fever.  HENT: Negative for sore throat.   Eyes: Negative for blurred vision and double vision.  Respiratory: Negative for cough and shortness of breath.   Cardiovascular: Negative for chest pain, palpitations and leg swelling.  Gastrointestinal: Positive for abdominal pain, nausea and vomiting. Negative for constipation and diarrhea.  Genitourinary: Negative for dysuria and frequency.  Neurological: Negative for dizziness, focal weakness, weakness and headaches.    Patient Active Problem List   Diagnosis Date Noted  . Intractable nausea and vomiting 04/20/2019  . Atypical chest pain 04/02/2019  . Type 2 diabetes mellitus without complications ([place]) 15/09/6977  . Anemia in chronic kidney disease 02/19/2019  . End stage renal disease ([place]) 02/19/2019  . Secondary hyperparathyroidism of renal origin ([place]) 02/19/2019  . Coagulation defect, unspecified ([place]) 02/19/2019  . Venous (peripheral) insufficiency 08/06/2017  . Encounter for screening for respiratory tuberculosis 01/01/2016  . Essential hypertension   . 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 Diabetic gastroparesis ([place]) 08/09/2015  . Low back pain 06/09/2014  . Diarrhea, unspecified 01/18/2012  . Obstructive sleep apnea of adult 01/16/2012  . GERD (gastroesophageal reflux disease) 01/16/2012  . Osteoarthritis of both knees 01/16/2012  . Hyperlipidemia, unspecified 07/18/2011  . LEIOMYOMA, UTERUS 01/14/2007  . Morbid obesity ([place]) 07/03/2006  . Former smoker 07/03/2006  . Tension headache 07/03/2006    Past Medical History: Past Medical History:  Diagnosis Date  . Accelerated hypertension   . Diabetes mellitus    type 2 - no meds   . ESRD (end stage renal disease) ([place])   . GERD (gastroesophageal reflux disease)   . Hyperlipidemia   . Hypertension   . HYPERTENSION, BENIGN SYSTEMIC 07/03/2006        . Hypertensive emergency 02/05/2019  . Nausea   . Renal disorder   . Wears dentures     Past Surgical History: Past Surgical History:  Procedure Laterality Date  . AV FISTULA PLACEMENT Left 02/17/2019   Procedure: ARTERIOVENOUS (AV) FISTULA CREATION LEFT UPPER ARM;  Surgeon: Waynetta Sandy, MD;  Location: [place];  Service: Vascular;  Laterality: Left;  . BREAST SURGERY     reduction  . CATARACT EXTRACTION     right eye  . CESAREAN SECTION     x2  . CHOLECYSTECTOMY     laparoscopic  . FISTULA SUPERFICIALIZATION Left 04/06/2019   Procedure: FISTULA SUPERFICIALIZATION LEFT BRACHIOCEPHALIC;  Surgeon: Waynetta Sandy, MD;  Location: [place];  Service: Vascular;  Laterality: Left;  Transposition left arm brachiocephalic fistula.  . IR FLUORO GUIDE CV LINE RIGHT  02/09/2019  . IR FLUORO GUIDE CV LINE RIGHT  03/05/2019  . IR US GUIDE VASC ACCESS RIGHT  02/09/2019  . MULTIPLE TOOTH EXTRACTIONS    . REDUCTION MAMMAPLASTY Bilateral     Social History: Social History   Tobacco Use  . Smoking status: Former Smoker    Years: 10.00    Types: Cigarettes    Quit date: 10/16/2011    Years since quitting: 7.5  . Smokeless tobacco: Never Used  .  Tobacco comment: 1 pack will last a week  Substance Use Topics  . Alcohol use: No    Alcohol/week: 0.0 standard drinks  . Drug use: No   Additional social history: Lives with son Please also refer to relevant sections of EMR.  Family History: Family History  Problem Relation Age of Onset  . Hypertension Mother   . Diabetes Mother   . Cancer Father        lung  . Colon cancer Neg Hx   . Stomach cancer Neg Hx   . Esophageal cancer Neg Hx     Allergies and Medications: Allergies  Allergen Reactions  . Lisinopril Anaphylaxis and Other (See Comments)     angioedema  . Camellia Swelling and Other (See Comments)    Angioedema   . Jardiance [Empagliflozin] Swelling and Rash   No current facility-administered medications on file prior to encounter.   Current Outpatient Medications on File Prior to Encounter  Medication Sig Dispense Refill  . Accu-Chek Softclix Lancets lancets Use as instructed 100 each 12  . acetaminophen (TYLENOL) 500 MG tablet Take 1,000 mg by mouth every 6 (six) hours as needed for moderate pain or headache.    Marland Kitchen amLODipine (NORVASC) 10 MG tablet TAKE 1 TABLET BY MOUTH ONCE DAILY (Patient taking differently: Take 10 mg by mouth daily. ) 30 tablet 0  . aspirin EC 325 MG tablet Take 325 mg by mouth daily.    Marland Kitchen atorvastatin (LIPITOR) 20 MG tablet TAKE 1 TABLET BY MOUTH ONCE DAILY AT  6PM (Patient not taking: No sig reported) 90 tablet 2  . B Complex-C-Folic Acid (DIALYVITE 782) 0.8 MG TABS Take 1 tablet by mouth daily.    . Blood Glucose Monitoring Suppl (ACCU-CHEK AVIVA PLUS) w/Device KIT Use to check sugar three times a day 1 kit 0  . carvedilol (COREG) 25 MG tablet Take 1 tablet (25 mg total) by mouth 2 (two) times daily with a meal. 180 tablet 1  . cloNIDine (CATAPRES) 0.2 MG tablet Take 0.2 mg by mouth 2 (two) times daily.    . ferrous sulfate 325 (65 FE) MG tablet Take 325 mg by mouth daily with breakfast.    . furosemide (LASIX) 40 MG tablet Take 40 mg by mouth 2 (two) times daily.    Marland Kitchen glucose blood (ACCU-CHEK AVIVA PLUS) test strip Use as instructed 100 each 12  . Lancets (ACCU-CHEK SOFT TOUCH) lancets Use to check sugars three times a day 100 each 12  . polyethylene glycol (MIRALAX / GLYCOLAX) packet Take 17 g by mouth daily as needed for moderate constipation. 30 each 0  . potassium chloride SA (K-DUR) 20 MEQ tablet Take 20 mEq by mouth daily.    . traMADol (ULTRAM) 50 MG tablet Take 1 tablet (50 mg total) by mouth every 6 (six) hours as needed. 10 tablet 0  . Vitamin D, Ergocalciferol, (DRISDOL) 1.25 MG (50000  UT) CAPS capsule Take 50,000 Units by mouth every Monday.       Objective: BP (!) 170/110 (BP Location: Right Arm)   Pulse (!) 115   Temp 98.3 F (36.8 C)   Resp (!) 21   Ht '5\' 9"'$  (1.753 m)   Wt 106.6 kg   LMP  (LMP Unknown)   SpO2 100%   BMI 34.70 kg/m  Exam: General: Alert and oriented.  Laying in bed.  Mild to moderate discomfort and nausea. Eyes: No scleral icterus.  PERRLA.  EOMI. ENTM: Moist oral mucosa. Neck: No thyromegaly.  No cervical lymphadenopathy. Cardiovascular: Tachycardic rate.  Regular rhythm.  No murmurs.  2+ radial pulse bilaterally.  No pitting edema. Respiratory: Lungs clear to auscultation bilaterally.  No crackles or wheezes.  Normal work of breathing.  No tachypnea. Gastrointestinal: Large pannus.  Soft, mildly tender to palpation on the right side upper and lower.  bowel sounds present MSK: 5/5 strength in hands and feet bilaterally. Derm: No rashes.  Skin warm and dry. Neuro: Cranial nerves II through XII grossly intact. Psych: Normal affect.  Labs and Imaging: CBC BMET  Recent Labs  Lab 04/20/19 0557  WBC 14.4*  HGB 14.5  HCT 47.2*  PLT 432*   Recent Labs  Lab 04/20/19 0557  NA 140  K 4.9  CL 97*  CO2 20*  BUN 71*  CREATININE 13.47*  GLUCOSE 187*  CALCIUM 10.0     IRC:VELFY dialysis catheter unchanged. Mild cardiomegaly is unchanged. No pulmonary edema or acute airspace disease. No pleural fluid or pneumothorax. No acute osseous abnormalities.   Benay Pike, MD 04/20/2019, 12:36 PM PGY-2, [facility] pager: [phone], text pages welcome

## 2019-04-20 NOTE — Consult Note (Signed)
[facility] Renal Consultation Note    Indication for Consultation:  Management of ESRD/hemodialysis; anemia, hypertension/volume and secondary hyperparathyroidism PCP: Matilde Haymaker, MD  HPI: [name] is a 58 y.o. female with ESRD on MWF HD at [facility] who has a hx DM, gastroparesis, HTN, GERD who reports the onset of N and V since Saturday. No diarrhea, no cough.  Pain in in both upper quandrants  She denied and problems with last HD except system clotting near the end of treatments.  No dizziness when walking at home. She has no fever or chills or no known ill contacts. She was too ill to go to dialysis Monday.  Labs in the ED showed [id number] Cr 13.47 K 4.9 CO2 20 glu 187 Ca 10 trop 91 > 84 hgb 14.5 WBC elevated at 14.4 CXR NAD COVID pending.  She had a similar episode with admission in November.  She recently had spiritualization of left AVF 12.1 She is hypertensive and tachycardic today and has been unable to keep down meds which include clonidine and coreg.  She cannot cite any one think that precipitated the reoccurrence of N and V.  Past Medical History:  Diagnosis Date  . Accelerated hypertension   . Diabetes mellitus    type 2 - no meds  . ESRD (end stage renal disease) ([place])   . GERD (gastroesophageal reflux disease)   . Hyperlipidemia   . Hypertension   . HYPERTENSION, BENIGN SYSTEMIC 07/03/2006        . Hypertensive emergency 02/05/2019  . Nausea   . Renal disorder   . Wears dentures    Past Surgical History:  Procedure Laterality Date  . AV FISTULA PLACEMENT Left 02/17/2019   Procedure: ARTERIOVENOUS (AV) FISTULA CREATION LEFT UPPER ARM;  Surgeon: Waynetta Sandy, MD;  Location: [place];  Service: Vascular;  Laterality: Left;  . BREAST SURGERY     reduction  . CATARACT EXTRACTION     right eye  . CESAREAN SECTION     x2  . CHOLECYSTECTOMY     laparoscopic  . FISTULA SUPERFICIALIZATION Left 04/06/2019   Procedure: FISTULA SUPERFICIALIZATION LEFT  BRACHIOCEPHALIC;  Surgeon: Waynetta Sandy, MD;  Location: [place];  Service: Vascular;  Laterality: Left;  Transposition left arm brachiocephalic fistula.  . IR FLUORO GUIDE CV LINE RIGHT  02/09/2019  . IR FLUORO GUIDE CV LINE RIGHT  03/05/2019  . IR US GUIDE VASC ACCESS RIGHT  02/09/2019  . MULTIPLE TOOTH EXTRACTIONS    . REDUCTION MAMMAPLASTY Bilateral    Family History  Problem Relation Age of Onset  . Hypertension Mother   . Diabetes Mother   . Cancer Father        lung  . Colon cancer Neg Hx   . Stomach cancer Neg Hx   . Esophageal cancer Neg Hx    Social History:  reports that she quit smoking about 7 years ago. Her smoking use included cigarettes. She quit after 10.00 years of use. She has never used smokeless tobacco. She reports that she does not drink alcohol or use drugs. Allergies  Allergen Reactions  . Lisinopril Anaphylaxis and Other (See Comments)    angioedema  . Camellia Swelling and Other (See Comments)    Angioedema   . Jardiance [Empagliflozin] Swelling and Rash   Prior to Admission medications   Medication Sig Start Date End Date Taking? Authorizing Provider  acetaminophen (TYLENOL) 500 MG tablet Take 1,000 mg by mouth every 6 (six) hours as needed  for moderate pain or headache.   Yes [provider]  amLODipine (NORVASC) 10 MG tablet TAKE 1 TABLET BY MOUTH ONCE DAILY Patient taking differently: Take 10 mg by mouth daily.  01/06/18  Yes Diallo, Abdoulaye, MD  aspirin EC 325 MG tablet Take 325 mg by mouth daily.   Yes [provider]  atorvastatin (LIPITOR) 20 MG tablet TAKE 1 TABLET BY MOUTH ONCE DAILY AT  6PM Patient taking differently: Take 20 mg by mouth daily at 6 PM.  09/16/17  Yes Gunadasa, Almond Lint, MD  AURYXIA 1 GM 210 MG(Fe) tablet Take 420 mg by mouth 3 (three) times daily. 04/16/19  Yes [provider]  B Complex-C-Folic Acid (DIALYVITE 283) 0.8 MG TABS Take 1 tablet by mouth daily. 03/02/19  Yes [provider]  carvedilol (COREG) 25 MG tablet Take 1 tablet (25 mg total) by mouth 2 (two) times daily with a meal. 07/01/18  Yes Diallo, Abdoulaye, MD  cloNIDine (CATAPRES) 0.2 MG tablet Take 0.2 mg by mouth 2 (two) times daily.   Yes [provider]  ferrous sulfate 325 (65 FE) MG tablet Take 325 mg by mouth 3 (three) times a week. Mon, Wed, Fri   Yes [provider]  furosemide (LASIX) 40 MG tablet Take 40 mg by mouth daily.    Yes [provider]  polyethylene glycol (MIRALAX / GLYCOLAX) packet Take 17 g by mouth daily as needed for moderate constipation. 08/06/17  Yes [name], MD  potassium chloride SA (K-DUR) 20 MEQ tablet Take 20 mEq by mouth daily.   Yes [provider]  promethazine (PHENERGAN) 25 MG suppository Place 25 mg rectally every 6 (six) hours as needed for nausea/vomiting. 04/18/19  Yes [provider]  traMADol (ULTRAM) 50 MG tablet Take 1 tablet (50 mg total) by mouth every 6 (six) hours as needed. 04/06/19  Yes Ulyses Amor, PA-C  Vitamin D, Ergocalciferol, (DRISDOL) 1.25 MG (50000 UT) CAPS capsule Take 50,000 Units by mouth every Monday.    Yes [provider]  Accu-Chek Softclix Lancets lancets Use as instructed 09/29/18   Diallo, Earna Coder, MD  Blood Glucose Monitoring Suppl (ACCU-CHEK AVIVA PLUS) w/Device KIT Use to check sugar three times a day 05/14/17   [name], MD  glucose blood (ACCU-CHEK AVIVA PLUS) test strip Use as instructed 09/29/18   Diallo, Earna Coder, MD  Lancets (ACCU-CHEK SOFT TOUCH) lancets Use to check sugars three times a day 09/24/18   Marjie Skiff, MD   Current Facility-Administered Medications  Medication Dose Route Frequency Provider Last Rate Last Admin  . heparin injection 5,000 Units  5,000 Units Subcutaneous Q8H Hensel, Jamal Collin, MD      . insulin aspart (novoLOG) injection 0-5 Units  0-5 Units Subcutaneous QHS Benay Pike, MD      . insulin aspart (novoLOG)  injection 0-9 Units  0-9 Units Subcutaneous TID WC Benay Pike, MD      . labetalol (NORMODYNE) injection 5 mg  5 mg Intravenous Q2H PRN Benay Pike, MD      . promethazine (PHENERGAN) injection 12.5 mg  12.5 mg Intravenous Q6H PRN Benay Pike, MD       Current Outpatient Medications  Medication Sig Dispense Refill  . acetaminophen (TYLENOL) 500 MG tablet Take 1,000 mg by mouth every 6 (six) hours as needed for moderate pain or headache.    Marland Kitchen amLODipine (NORVASC) 10 MG tablet TAKE 1 TABLET BY MOUTH ONCE DAILY (Patient taking  differently: Take 10 mg by mouth daily. ) 30 tablet 0  . aspirin EC 325 MG tablet Take 325 mg by mouth daily.    Marland Kitchen atorvastatin (LIPITOR) 20 MG tablet TAKE 1 TABLET BY MOUTH ONCE DAILY AT  6PM (Patient taking differently: Take 20 mg by mouth daily at 6 PM. ) 90 tablet 2  . AURYXIA 1 GM 210 MG(Fe) tablet Take 420 mg by mouth 3 (three) times daily.    . B Complex-C-Folic Acid (DIALYVITE 858) 0.8 MG TABS Take 1 tablet by mouth daily.    . carvedilol (COREG) 25 MG tablet Take 1 tablet (25 mg total) by mouth 2 (two) times daily with a meal. 180 tablet 1  . cloNIDine (CATAPRES) 0.2 MG tablet Take 0.2 mg by mouth 2 (two) times daily.    . ferrous sulfate 325 (65 FE) MG tablet Take 325 mg by mouth 3 (three) times a week. Mon, Wed, Fri    . furosemide (LASIX) 40 MG tablet Take 40 mg by mouth daily.     . polyethylene glycol (MIRALAX / GLYCOLAX) packet Take 17 g by mouth daily as needed for moderate constipation. 30 each 0  . potassium chloride SA (K-DUR) 20 MEQ tablet Take 20 mEq by mouth daily.    . promethazine (PHENERGAN) 25 MG suppository Place 25 mg rectally every 6 (six) hours as needed for nausea/vomiting.    . traMADol (ULTRAM) 50 MG tablet Take 1 tablet (50 mg total) by mouth every 6 (six) hours as needed. 10 tablet 0  . Vitamin D, Ergocalciferol, (DRISDOL) 1.25 MG (50000 UT) CAPS capsule Take 50,000 Units by mouth every Monday.     . Accu-Chek Softclix Lancets  lancets Use as instructed 100 each 12  . Blood Glucose Monitoring Suppl (ACCU-CHEK AVIVA PLUS) w/Device KIT Use to check sugar three times a day 1 kit 0  . glucose blood (ACCU-CHEK AVIVA PLUS) test strip Use as instructed 100 each 12  . Lancets (ACCU-CHEK SOFT TOUCH) lancets Use to check sugars three times a day 100 each 12   Labs: Basic Metabolic Panel: Recent Labs  Lab 04/20/19 0557  NA 140  K 4.9  CL 97*  CO2 20*  GLUCOSE 187*  BUN 71*  CREATININE 13.47*  CALCIUM 10.0   Liver Function Tests: No results for input(s): AST, ALT, ALKPHOS, BILITOT, PROT, ALBUMIN in the last 168 hours. No results for input(s): LIPASE, AMYLASE in the last 168 hours. No results for input(s): AMMONIA in the last 168 hours. CBC: Recent Labs  Lab 04/20/19 0557  WBC 14.4*  HGB 14.5  HCT 47.2*  MCV 85.4  PLT 432*   Cardiac Enzymes: No results for input(s): CKTOTAL, CKMB, CKMBINDEX, TROPONINI in the last 168 hours. CBG: No results for input(s): GLUCAP in the last 168 hours. Iron Studies: No results for input(s): IRON, TIBC, TRANSFERRIN, FERRITIN in the last 72 hours. Studies/Results: DG Chest 2 View  Result Date: 04/20/2019 CLINICAL DATA:  Chest pain. Nausea and vomiting. EXAM: CHEST - 2 VIEW COMPARISON:  Radiograph 04/02/2019 FINDINGS: Right dialysis catheter unchanged. Mild cardiomegaly is unchanged. No pulmonary edema or acute airspace disease. No pleural fluid or pneumothorax. No acute osseous abnormalities. IMPRESSION: Stable mild cardiomegaly. No acute abnormality. Electronically Signed   By: Keith Rake M.D.   On: 04/20/2019 06:14    ROS: As per HPI otherwise negative.  Physical Exam: Vitals:   04/20/19 0523 04/20/19 0812 04/20/19 1045 04/20/19 1130  BP: (!) 171/122 (!) 187/105 (!) 170/110 119/86  Pulse: (!) 122 (!) 126 (!) 115 (!) 118  Resp: 16 16 (!) 21 (!) 22  Temp: 98.3 F (36.8 C)     SpO2: 100% 100% 100% 100%  Weight: 106.6 kg     Height: '5\' 9"'$  (1.753 m)         General: WDWN NAD Head: NCAT sclera not icteric MMM Neck: Supple.  Lungs: CTA bilaterally without wheezes, rales, or rhonchi. Breathing is unlabored. Heart: tachy ~120s regular Abdomen: soft obese, large pannus - right and left UQ tenderness + BS Lower extremities: ithout edema or ischemic changes, no open wounds  Neuro: A & O  X 3. Moves all extremities spontaneously. Psych:  Responds to questions appropriately with a normal affect. Dialysis Access: right IJ TDC and left AVF + bruit  Dialysis Orders: MWF [facility] 4 hr 400/800 EDW 107.5 3 K 2.25 Ca heparin 2000 Mircera 50 q 2 weeks - due 12/14 (decreased from 100 11/30)  tsat 17% last hgb 10.7 iPTH 748 - [name] 3 on hold - not sure why - needs to be resumed- suspect had high correct Ca at some point -   Assessment/Plan: 1. Intractable N and V - recurrent; further evaluation and treatment by primary 2. ESRD -  MWF- last HD today; shortens treatments at times - prone to generous IDWG K 4.9 - plan HD today- admission wt recorded as 106.6 which is below edw; need to increase heparin at discharge to issues with system clotting  3. Hypertension/volume  - BP high - unable to keep down current meds - perhaps changing to clonidine patch at d/c would be better given N an V history - not getting to edw - last post HD wt 108 12/11 4. Anemia  - hgb ok - do not given ferrous sulfate- should not be resumed at d/c high hgb suggests she is dry considering last outpt Hgb was 10.7 - no ESA for now 5. Metabolic bone disease -  On Aurixya 2 ac - would hold till N and V resolve - resume hectorol at d/c  6. Nutrition - advance as tolerated - hold vitamins until N and V resolves 7. achycardia due to med withdrawal - favor switch to clonidine patch 8. DM - per primary A1C 5.1   03/2019  Myriam Jacobson, PA-C [place] [phone] 04/20/2019, 1:49 PM

## 2019-04-20 NOTE — Consult Note (Signed)
NAME:  [name], MRN:  [id number], DOB:  06/05/1960, LOS: 0 ADMISSION DATE:  04/20/2019, CONSULTATION DATE: 12/15 REFERRING MD: [name], CHIEF COMPLAINT: Hypertension  Brief History   Hypertensive urgency, somewhat responsive to low-dose IV PRNs.  Refractory nausea and vomiting for several days, unable to keep PTA antihypertensives down.  History of present illness   [name] is a 58 year old woman with a history of difficult to control hypertension, end-stage renal disease on dialysis for 2 months who presented to the hospital with refractory nausea and vomiting.  She has had nausea for the past 2 days and has been unable to tolerate her oral medications.  She was last dialyzed on Friday and completed most of her session before it was stopped due to clotting in her tunneled catheter.  She has had 2 hospital admissions in the last few months due to uncontrolled nausea and vomiting with hypertension.  This is mostly been a problem since starting dialysis.  She denies substance abuse, specifically marijuana.  She has received labetalol 5 mg x 2 doses and a clonidine patch has been placed.  She denies headache, vision changes, weakness, chest pain, shortness of breath.  Her daughter denies confusion recently.  Critical care was consulted for evaluation of hypertensive urgency.  Past Medical History  Diabetes hyperlipidemia HTN ESRD  Significant Hospital Events     Consults:  PCCM  Procedures:    Significant Diagnostic Tests:  CXR, 2 view 12/15- RIJ tunnel cath, no pulmonary edema or cardiomegaly.  Normal mediastinal contours.  EKG- sinus tachycardia, normal intervals, QR wave with T wave inversion in aVL.  Normal R wave progression across precordial's.  No significant ST segment deviation.  Micro Data:  Covid negative  Antimicrobials:    Interim history/subjective:    Objective   Blood pressure (!) 215/99, pulse (!) 111, temperature 98.3 F (36.8 C), temperature source  Oral, resp. rate 20, height _0  (1.753 m), weight 106.6 kg, SpO2 100 %.        Intake/Output Summary (Last 24 hours) at 04/20/2019 1731 Last data filed at 04/20/2019 1513 Gross per 24 hour  Intake 1000 ml  Output --  Net 1000 ml   Filed Weights   04/20/19 0523  Weight: 106.6 kg    Examination: General: Chronically ill-appearing woman lying in bed comfortably HENT: [place]/AT, eyes anicteric Lungs: CTA B, breathing comfortably on room air. Cardiovascular: Tachycardic, regular rhythm, no murmurs. Abdomen: Obese, soft, nontender Extremities: No peripheral edema, immature left upper extremity fistula.  Right tunneled catheter. Neuro: Awake and alert, answering questions appropriately, normal speech, symmetric strength in upper and lower extremities.  Moving all extremities spontaneously.  Extraocular motion intact.   Resolved Hospital Problem list     Assessment & Plan:   Hypertensive urgency-no evidence for endorgan dysfunction. -Labetalol 20 mg IV every 6 hours (PTA on Coreg 81m twice daily) -Hydralazine 10 mg IV every 6 hours as needed -Agree with clonidine patch.  May benefit from increasing the dose. -Enalaprilat IV would be a third line IV option -Goal SBP 170-2040m Hg today to avoid dropping her too quickly. -If she remains uncontrolled on aggressive intermittent IV therapy she should be upgraded to the ICU for continuous infusion.  Nausea and vomiting -Consider scheduled antiemetics  ESRD.  No urgent indications for dialysis. -HD per nephrology's recommendations-appreciate their assistance -May require antiemetics with dialysis   Recommend upgrading the patient to a progressive care unit for close blood pressure monitoring.  Please call critical care medicine  with questions or if she is unable to be adequately controlled with intermittent IV BP meds.   Best practice:   Family Communication: daughter updated with patient at bedside  Labs   CBC: Recent Labs   Lab 04/20/19 0557  WBC 14.4*  HGB 14.5  HCT 47.2*  MCV 85.4  PLT 432*    Basic Metabolic Panel: Recent Labs  Lab 04/20/19 0557  NA 140  K 4.9  CL 97*  CO2 20*  GLUCOSE 187*  BUN 71*  CREATININE 13.47*  CALCIUM 10.0   GFR: Estimated Creatinine Clearance: 5.8 mL/min (A) (by C-G formula based on SCr of 13.47 mg/dL (H)). Recent Labs  Lab 04/20/19 0557  WBC 14.4*    Liver Function Tests: Recent Labs  Lab 04/20/19 0924  AST 21  ALT 11  ALKPHOS 103  BILITOT 0.2*  PROT 8.5*  ALBUMIN 4.0   Recent Labs  Lab 04/20/19 0924  LIPASE 83*   No results for input(s): AMMONIA in the last 168 hours.  ABG    Component Value Date/Time   PHART 7.379 02/05/2019 0306   PCO2ART 30.0 (L) 02/05/2019 0306   PO2ART 74.0 (L) 02/05/2019 0306   HCO3 17.7 (L) 02/05/2019 0306   TCO2 28 04/06/2019 0906   ACIDBASEDEF 6.0 (H) 02/05/2019 0306   O2SAT 95.0 02/05/2019 0306     Coagulation Profile: No results for input(s): INR, PROTIME in the last 168 hours.  Cardiac Enzymes: No results for input(s): CKTOTAL, CKMB, CKMBINDEX, TROPONINI in the last 168 hours.  HbA1C: HbA1c, POC (controlled diabetic range)  Date/Time Value Ref Range Status  03/10/2019 09:02 AM 5.1 0.0 - 7.0 % Final  11/12/2018 08:50 AM 6.8 0.0 - 7.0 % Final    CBG: Recent Labs  Lab 04/20/19 1644  GLUCAP 130*    Review of Systems:   ROS: positive for nausea and vomiting Otherwise review of systems negative  Past Medical History  She,  has a past medical history of Accelerated hypertension, Diabetes mellitus, ESRD (end stage renal disease) ([place]), GERD (gastroesophageal reflux disease), Hyperlipidemia, Hypertension, HYPERTENSION, BENIGN SYSTEMIC (07/03/2006), Hypertensive emergency (02/05/2019), Nausea, Renal disorder, and Wears dentures.   Surgical History    Past Surgical History:  Procedure Laterality Date  . AV FISTULA PLACEMENT Left 02/17/2019   Procedure: ARTERIOVENOUS (AV) FISTULA CREATION LEFT  UPPER ARM;  Surgeon: Carolyn Sandy, MD;  Location: [facility];  Service: Vascular;  Laterality: Left;  . BREAST SURGERY     reduction  . CATARACT EXTRACTION     right eye  . CESAREAN SECTION     x2  . CHOLECYSTECTOMY     laparoscopic  . FISTULA SUPERFICIALIZATION Left 04/06/2019   Procedure: FISTULA SUPERFICIALIZATION LEFT BRACHIOCEPHALIC;  Surgeon: Carolyn Sandy, MD;  Location: [facility];  Service: Vascular;  Laterality: Left;  Transposition left arm brachiocephalic fistula.  . IR FLUORO GUIDE CV LINE RIGHT  02/09/2019  . IR FLUORO GUIDE CV LINE RIGHT  03/05/2019  . IR US GUIDE VASC ACCESS RIGHT  02/09/2019  . MULTIPLE TOOTH EXTRACTIONS    . REDUCTION MAMMAPLASTY Bilateral      Social History   reports that she quit smoking about 7 years ago. Her smoking use included cigarettes. She quit after 10.00 years of use. She has never used smokeless tobacco. She reports that she does not drink alcohol or use drugs.   Family History   Her family history includes Cancer in her father; Diabetes in her mother; Hypertension in her mother. There is  no history of Colon cancer, Stomach cancer, or Esophageal cancer.   Allergies Allergies  Allergen Reactions  . Lisinopril Anaphylaxis and Other (See Comments)    angioedema  . Camellia Swelling and Other (See Comments)    Angioedema   . Jardiance [Empagliflozin] Swelling and Rash     Home Medications  Prior to Admission medications   Medication Sig Start Date End Date Taking? Authorizing Provider  acetaminophen (TYLENOL) 500 MG tablet Take 1,000 mg by mouth every 6 (six) hours as needed for moderate pain or headache.   Yes [provider]  amLODipine (NORVASC) 10 MG tablet TAKE 1 TABLET BY MOUTH ONCE DAILY Patient taking differently: Take 10 mg by mouth daily.  01/06/18  Yes Diallo, Abdoulaye, MD  aspirin EC 325 MG tablet Take 325 mg by mouth daily.   Yes [provider]  atorvastatin (LIPITOR) 20 MG tablet TAKE 1  TABLET BY MOUTH ONCE DAILY AT  6PM Patient taking differently: Take 20 mg by mouth daily at 6 PM.  09/16/17  Yes Gunadasa, Almond Lint, MD  AURYXIA 1 GM 210 MG(Fe) tablet Take 420 mg by mouth 3 (three) times daily. 04/16/19  Yes [provider]  B Complex-C-Folic Acid (DIALYVITE 161) 0.8 MG TABS Take 1 tablet by mouth daily. 03/02/19  Yes [provider]  carvedilol (COREG) 25 MG tablet Take 1 tablet (25 mg total) by mouth 2 (two) times daily with a meal. 07/01/18  Yes Diallo, Abdoulaye, MD  cloNIDine (CATAPRES) 0.2 MG tablet Take 0.2 mg by mouth 2 (two) times daily.   Yes [provider]  ferrous sulfate 325 (65 FE) MG tablet Take 325 mg by mouth 3 (three) times a week. Mon, Wed, Fri   Yes [provider]  furosemide (LASIX) 40 MG tablet Take 40 mg by mouth daily.    Yes [provider]  polyethylene glycol (MIRALAX / GLYCOLAX) packet Take 17 g by mouth daily as needed for moderate constipation. 08/06/17  Yes [name], MD  potassium chloride SA (K-DUR) 20 MEQ tablet Take 20 mEq by mouth daily.   Yes [provider]  promethazine (PHENERGAN) 25 MG suppository Place 25 mg rectally every 6 (six) hours as needed for nausea/vomiting. 04/18/19  Yes [provider]  traMADol (ULTRAM) 50 MG tablet Take 1 tablet (50 mg total) by mouth every 6 (six) hours as needed. 04/06/19  Yes Ulyses Amor, PA-C  Vitamin D, Ergocalciferol, (DRISDOL) 1.25 MG (50000 UT) CAPS capsule Take 50,000 Units by mouth every Monday.    Yes [provider]  Accu-Chek Softclix Lancets lancets Use as instructed 09/29/18   Diallo, Earna Coder, MD  Blood Glucose Monitoring Suppl (ACCU-CHEK AVIVA PLUS) w/Device KIT Use to check sugar three times a day 05/14/17   [name], MD  glucose blood (ACCU-CHEK AVIVA PLUS) test strip Use as instructed 09/29/18   Marjie Skiff, MD  Lancets (ACCU-CHEK SOFT TOUCH) lancets Use to check sugars three times a day  09/24/18   Marjie Skiff, MD      Julian Hy, DO 04/20/19 5:31 PM [facility]

## 2019-04-20 NOTE — Progress Notes (Signed)
Pt received from [facility]. Oriented to unit and room. CHG wipes applied. VSS. Call bell in reach. Pt denies needs at this time.  Arletta Bale, RN

## 2019-04-20 NOTE — ED Provider Notes (Signed)
[facility] EMERGENCY DEPARTMENT Provider Note   CSN: [id number] Arrival date & time: 04/20/19  0515     History Chief Complaint  Patient presents with  . N/V    [name] is a 58 y.o. female.  Level 5 caveat for acuity of condition.  Chief complaint nausea and vomiting for 2 days without diarrhea.  Past medical history end-stage renal disease with dialysis Monday Wednesday Friday.  Last dialysis Friday.  Unable to take her antihypertensive medication secondary to nausea.  Review of systems positive for questionable chest pain.        Past Medical History:  Diagnosis Date  . Accelerated hypertension   . Diabetes mellitus    type 2 - no meds  . ESRD (end stage renal disease) ([place])   . GERD (gastroesophageal reflux disease)   . Hyperlipidemia   . Hypertension   . HYPERTENSION, BENIGN SYSTEMIC 07/03/2006        . Hypertensive emergency 02/05/2019  . Nausea   . Renal disorder   . Wears dentures     Patient Active Problem List   Diagnosis Date Noted  . Atypical chest pain 04/02/2019  . Type 2 diabetes mellitus without complications ([place]) 78/29/5621  . Anemia in chronic kidney disease 02/19/2019  . End stage renal disease ([place]) 02/19/2019  . Secondary hyperparathyroidism of renal origin ([place]) 02/19/2019  . Coagulation defect, unspecified ([place]) 02/19/2019  . Venous (peripheral) insufficiency 08/06/2017  . Encounter for screening for respiratory tuberculosis 01/01/2016  . Essential hypertension   . 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 Diabetic gastroparesis ([place]) 08/09/2015  . Low back pain 06/09/2014  . Diarrhea, unspecified 01/18/2012  . Obstructive sleep apnea of adult 01/16/2012  . GERD (gastroesophageal reflux disease) 01/16/2012  . Osteoarthritis of both knees 01/16/2012  . Hyperlipidemia, unspecified 07/18/2011  . LEIOMYOMA, UTERUS 01/14/2007  . Morbid obesity ([place]) 07/03/2006  . Former smoker 07/03/2006  .  Tension headache 07/03/2006    Past Surgical History:  Procedure Laterality Date  . AV FISTULA PLACEMENT Left 02/17/2019   Procedure: ARTERIOVENOUS (AV) FISTULA CREATION LEFT UPPER ARM;  Surgeon: Waynetta Sandy, MD;  Location: [facility];  Service: Vascular;  Laterality: Left;  . BREAST SURGERY     reduction  . CATARACT EXTRACTION     right eye  . CESAREAN SECTION     x2  . CHOLECYSTECTOMY     laparoscopic  . FISTULA SUPERFICIALIZATION Left 04/06/2019   Procedure: FISTULA SUPERFICIALIZATION LEFT BRACHIOCEPHALIC;  Surgeon: Waynetta Sandy, MD;  Location: [facility];  Service: Vascular;  Laterality: Left;  Transposition left arm brachiocephalic fistula.  . IR FLUORO GUIDE CV LINE RIGHT  02/09/2019  . IR FLUORO GUIDE CV LINE RIGHT  03/05/2019  . IR US GUIDE VASC ACCESS RIGHT  02/09/2019  . MULTIPLE TOOTH EXTRACTIONS    . REDUCTION MAMMAPLASTY Bilateral      OB History   No obstetric history on file.     Family History  Problem Relation Age of Onset  . Hypertension Mother   . Diabetes Mother   . Cancer Father        lung  . Colon cancer Neg Hx   . Stomach cancer Neg Hx   . Esophageal cancer Neg Hx     Social History   Tobacco Use  . Smoking status: Former Smoker    Years: 10.00    Types: Cigarettes    Quit date: 10/16/2011    Years  since quitting: 7.5  . Smokeless tobacco: Never Used  . Tobacco comment: 1 pack will last a week  Substance Use Topics  . Alcohol use: No    Alcohol/week: 0.0 standard drinks  . Drug use: No    Home Medications Prior to Admission medications   Medication Sig Start Date End Date Taking? Authorizing Provider  Accu-Chek Softclix Lancets lancets Use as instructed 09/29/18   Diallo, Earna Coder, MD  acetaminophen (TYLENOL) 500 MG tablet Take 1,000 mg by mouth every 6 (six) hours as needed for moderate pain or headache.    [provider]  amLODipine (NORVASC) 10 MG tablet TAKE 1 TABLET BY MOUTH ONCE DAILY Patient taking  differently: Take 10 mg by mouth daily.  01/06/18   Diallo, Earna Coder, MD  aspirin EC 325 MG tablet Take 325 mg by mouth daily.    [provider]  atorvastatin (LIPITOR) 20 MG tablet TAKE 1 TABLET BY MOUTH ONCE DAILY AT  6PM Patient not taking: No sig reported 09/16/17   [name], MD  B Complex-C-Folic Acid (DIALYVITE 417) 0.8 MG TABS Take 1 tablet by mouth daily. 03/02/19   [provider]  Blood Glucose Monitoring Suppl (ACCU-CHEK AVIVA PLUS) w/Device KIT Use to check sugar three times a day 05/14/17   [name], MD  carvedilol (COREG) 25 MG tablet Take 1 tablet (25 mg total) by mouth 2 (two) times daily with a meal. 07/01/18   Diallo, Abdoulaye, MD  cloNIDine (CATAPRES) 0.2 MG tablet Take 0.2 mg by mouth 2 (two) times daily.    [provider]  ferrous sulfate 325 (65 FE) MG tablet Take 325 mg by mouth daily with breakfast.    [provider]  furosemide (LASIX) 40 MG tablet Take 40 mg by mouth 2 (two) times daily.    [provider]  glucose blood (ACCU-CHEK AVIVA PLUS) test strip Use as instructed 09/29/18   Diallo, Earna Coder, MD  Lancets (ACCU-CHEK SOFT TOUCH) lancets Use to check sugars three times a day 09/24/18   Diallo, Earna Coder, MD  polyethylene glycol (MIRALAX / GLYCOLAX) packet Take 17 g by mouth daily as needed for moderate constipation. 08/06/17   [name], MD  potassium chloride SA (K-DUR) 20 MEQ tablet Take 20 mEq by mouth daily.    [provider]  traMADol (ULTRAM) 50 MG tablet Take 1 tablet (50 mg total) by mouth every 6 (six) hours as needed. 04/06/19   Ulyses Amor, PA-C  Vitamin D, Ergocalciferol, (DRISDOL) 1.25 MG (50000 UT) CAPS capsule Take 50,000 Units by mouth every Monday.     [provider]    Allergies    Lisinopril, Camellia, and Jardiance [empagliflozin]  Review of Systems   Review of Systems  Unable to perform ROS: Acuity of condition    Physical Exam Updated Vital  Signs BP (!) 170/110 (BP Location: Right Arm)   Pulse (!) 115   Temp 98.3 F (36.8 C)   Resp (!) 21   Ht '5\' 9"'$  (1.753 m)   Wt 106.6 kg   LMP  (LMP Unknown)   SpO2 100%   BMI 34.70 kg/m   Physical Exam Vitals and nursing note reviewed.  Constitutional:      Appearance: She is well-developed.     Comments: Dry mucous membranes  HENT:     Head: Normocephalic and atraumatic.  Eyes:     Conjunctiva/sclera: Conjunctivae normal.  Cardiovascular:     Rate and Rhythm: Regular rhythm. Tachycardia present.  Pulmonary:     Effort: Pulmonary effort is normal.     Breath sounds: Normal breath sounds.  Abdominal:     General: Bowel sounds are normal.     Palpations: Abdomen is soft.  Musculoskeletal:        General: Normal range of motion.     Cervical back: Neck supple.  Skin:    General: Skin is warm and dry.  Neurological:     General: No focal deficit present.     Mental Status: She is alert and oriented to person, place, and time.  Psychiatric:        Behavior: Behavior normal.     ED Results / Procedures / Treatments   Labs (all labs ordered are listed, but only abnormal results are displayed) Labs Reviewed  BASIC METABOLIC PANEL - Abnormal; Notable for the following components:      Result Value   Chloride 97 (*)    CO2 20 (*)    Glucose, Bld 187 (*)    BUN 71 (*)    Creatinine, Ser 13.47 (*)    GFR calc non Af Amer 3 (*)    GFR calc Af Amer 3 (*)    Anion gap 23 (*)    All other components within normal limits  CBC - Abnormal; Notable for the following components:   WBC 14.4 (*)    RBC 5.53 (*)    HCT 47.2 (*)    Platelets 432 (*)    All other components within normal limits  TROPONIN I (HIGH SENSITIVITY) - Abnormal; Notable for the following components:   Troponin I (High Sensitivity) 91 (*)    All other components within normal limits  TROPONIN I (HIGH SENSITIVITY) - Abnormal; Notable for the following components:   Troponin I (High Sensitivity) 84 (*)      All other components within normal limits  SARS CORONAVIRUS 2 (TAT 6-24 HRS)    EKG EKG Interpretation  Date/Time:  Tuesday April 20 2019 08:36:37 EST Ventricular Rate:  121 PR Interval:    QRS Duration: 96 QT Interval:  341 QTC Calculation: 484 R Axis:   28 Text Interpretation: Sinus tachycardia Probable LVH with secondary repol abnrm Confirmed by Nat Christen [phone]) on 04/20/2019 10:33:22 AM   Radiology DG Chest 2 View  Result Date: 04/20/2019 CLINICAL DATA:  Chest pain. Nausea and vomiting. EXAM: CHEST - 2 VIEW COMPARISON:  Radiograph 04/02/2019 FINDINGS: Right dialysis catheter unchanged. Mild cardiomegaly is unchanged. No pulmonary edema or acute airspace disease. No pleural fluid or pneumothorax. No acute osseous abnormalities. IMPRESSION: Stable mild cardiomegaly. No acute abnormality. Electronically Signed   By: Keith Rake M.D.   On: 04/20/2019 06:14    Procedures Procedures (including critical care time)  Medications Ordered in ED Medications  sodium chloride 0.9 % bolus 1,000 mL (1,000 mLs Intravenous New Bag/Given 04/20/19 0924)  ondansetron (ZOFRAN) injection 4 mg (4 mg Intravenous Given 04/20/19 0920)  pantoprazole (PROTONIX) injection 40 mg (40 mg Intravenous Given 04/20/19 8270)    ED Course  I have reviewed the triage vital signs and the nursing notes.  Pertinent labs & imaging results that were available during my care of the patient were reviewed by me and considered in my medical decision making (see chart for details).    MDM Rules/Calculators/A&P                      Patient presents with nausea vomiting.  Last dialysis Friday.  She remains tachycardic  despite hydration, IV Zofran, IV Protonix.  Will consult family medicine.  CRITICAL CARE Performed by: Nat Christen Total critical care time: 30 minutes Critical care time was exclusive of separately billable procedures and treating other patients. Critical care was necessary to treat or  prevent imminent or life-threatening deterioration. Critical care was time spent personally by me on the following activities: development of treatment plan with patient and/or surrogate as well as nursing, discussions with consultants, evaluation of patient's response to treatment, examination of patient, obtaining history from patient or surrogate, ordering and performing treatments and interventions, ordering and review of laboratory studies, ordering and review of radiographic studies, pulse oximetry and re-evaluation of patient's condition. Final Clinical Impression(s) / ED Diagnoses Final diagnoses:  Nausea and vomiting, intractability of vomiting not specified, unspecified vomiting type  ESRD (end stage renal disease) ([place])    Rx / DC Orders ED Discharge Orders    None       Nat Christen, MD 04/20/19 1115

## 2019-04-20 NOTE — ED Triage Notes (Signed)
Pt c/o N/V that began two days ago. N/V that led to CP. This happened in November as well.

## 2019-04-20 NOTE — ED Notes (Addendum)
ED TO INPATIENT HANDOFF REPORT  ED Nurse Name and Phone #: Thurmond Butts [place] Name/Age/Gender [name] 58 y.o. female Room/Bed: 039C/039C  Code Status   Code Status: Full Code  Home/SNF/Other Home Patient oriented to: self, place, time and situation Is this baseline? Yes   Triage Complete: Triage complete  Chief Complaint Intractable nausea and vomiting [R11.2]  Triage Note Pt c/o N/V that began two days ago. N/V that led to CP. This happened in November as well.    Allergies Allergies  Allergen Reactions  . Lisinopril Anaphylaxis and Other (See Comments)    angioedema  . Camellia Swelling and Other (See Comments)    Angioedema   . Jardiance [Empagliflozin] Swelling and Rash    Level of Care/Admitting Diagnosis ED Disposition    ED Disposition Condition Comment   Admit  Hospital Area: [facility] [[id number]]  Level of Care: Telemetry Medical [104]  Covid Evaluation: Asymptomatic Screening Protocol (No Symptoms)  Diagnosis: Intractable nausea and vomiting [010932]  Admitting Physician: Benay Pike [[id number]]  Attending Physician: Zenia Resides [[id number]]  Estimated length of stay: 3 - 4 days  Certification:: I certify this patient will need inpatient services for at least 2 midnights       B Medical/Surgery History Past Medical History:  Diagnosis Date  . Accelerated hypertension   . Diabetes mellitus    type 2 - no meds  . ESRD (end stage renal disease) ([facility])   . GERD (gastroesophageal reflux disease)   . Hyperlipidemia   . Hypertension   . HYPERTENSION, BENIGN SYSTEMIC 07/03/2006        . Hypertensive emergency 02/05/2019  . Nausea   . Renal disorder   . Wears dentures    Past Surgical History:  Procedure Laterality Date  . AV FISTULA PLACEMENT Left 02/17/2019   Procedure: ARTERIOVENOUS (AV) FISTULA CREATION LEFT UPPER ARM;  Surgeon: Waynetta Sandy, MD;  Location: [facility];  Service: Vascular;  Laterality: Left;  .  BREAST SURGERY     reduction  . CATARACT EXTRACTION     right eye  . CESAREAN SECTION     x2  . CHOLECYSTECTOMY     laparoscopic  . FISTULA SUPERFICIALIZATION Left 04/06/2019   Procedure: FISTULA SUPERFICIALIZATION LEFT BRACHIOCEPHALIC;  Surgeon: Waynetta Sandy, MD;  Location: [facility];  Service: Vascular;  Laterality: Left;  Transposition left arm brachiocephalic fistula.  . IR FLUORO GUIDE CV LINE RIGHT  02/09/2019  . IR FLUORO GUIDE CV LINE RIGHT  03/05/2019  . IR US GUIDE VASC ACCESS RIGHT  02/09/2019  . MULTIPLE TOOTH EXTRACTIONS    . REDUCTION MAMMAPLASTY Bilateral      A IV Location/Drains/Wounds Patient Lines/Drains/Airways Status   Active Line/Drains/Airways    Name:   Placement date:   Placement time:   Site:   Days:   Peripheral IV 04/20/19 Right Antecubital   04/20/19    0848    Antecubital   less than 1   Fistula / Graft Left Upper arm Arteriovenous fistula   02/17/19    1129    Upper arm   62   Hemodialysis Catheter Right Internal jugular Double lumen Permanent (Tunneled)   03/05/19    1114    Internal jugular   46   Incision (Closed) 02/03/19 Back Mid;Left   02/03/19    0911     76   Incision (Closed) 02/17/19 Arm Left   02/17/19    1130     62  Incision (Closed) 04/06/19 Arm Left   04/06/19    1049     14          Intake/Output Last 24 hours  Intake/Output Summary (Last 24 hours) at 04/20/2019 1532 Last data filed at 04/20/2019 1513 Gross per 24 hour  Intake 1000 ml  Output --  Net 1000 ml    Labs/Imaging Results for orders placed or performed during the hospital encounter of 04/20/19 (from the past 48 hour(s))  Basic metabolic panel     Status: Abnormal   Collection Time: 04/20/19  5:57 AM  Result Value Ref Range   Sodium 140 135 - 145 mmol/L   Potassium 4.9 3.5 - 5.1 mmol/L   Chloride 97 (L) 98 - 111 mmol/L   CO2 20 (L) 22 - 32 mmol/L   Glucose, Bld 187 (H) 70 - 99 mg/dL   BUN 71 (H) 6 - 20 mg/dL   Creatinine, Ser 13.47 (H) 0.44 - 1.00  mg/dL   Calcium 10.0 8.9 - 10.3 mg/dL   GFR calc non Af Amer 3 (L) >60 mL/min   GFR calc Af Amer 3 (L) >60 mL/min   Anion gap 23 (H) 5 - 15    Comment: Performed at [facility], [address], [place], [place] [address]  CBC     Status: Abnormal   Collection Time: 04/20/19  5:57 AM  Result Value Ref Range   WBC 14.4 (H) 4.0 - 10.5 K/uL   RBC 5.53 (H) 3.87 - 5.11 MIL/uL   Hemoglobin 14.5 12.0 - 15.0 g/dL   HCT 47.2 (H) 36.0 - 46.0 %   MCV 85.4 80.0 - 100.0 fL   MCH 26.2 26.0 - 34.0 pg   MCHC 30.7 30.0 - 36.0 g/dL   RDW 14.5 11.5 - 15.5 %   Platelets 432 (H) 150 - 400 K/uL   nRBC 0.0 0.0 - 0.2 %    Comment: Performed at [facility], [place] [address] [place], [place] [address]  Troponin I (High Sensitivity)     Status: Abnormal   Collection Time: 04/20/19  5:57 AM  Result Value Ref Range   Troponin I (High Sensitivity) 91 (H) <18 ng/L    Comment: (NOTE) Elevated high sensitivity troponin I (hsTnI) values and significant  changes across serial measurements may suggest ACS but many other  chronic and acute conditions are known to elevate hsTnI results.  Refer to the Links section for chest pain algorithms and additional  guidance. Performed at [facility], [place] [address] [place], [place] [address]   Troponin I (High Sensitivity)     Status: Abnormal   Collection Time: 04/20/19  8:12 AM  Result Value Ref Range   Troponin I (High Sensitivity) 84 (H) <18 ng/L    Comment: (NOTE) Elevated high sensitivity troponin I (hsTnI) values and significant  changes across serial measurements may suggest ACS but many other  chronic and acute conditions are known to elevate hsTnI results.  Refer to the "Links" section for chest pain algorithms and additional  guidance. Performed at [facility], [place] [address] [place], [place] [address]   SARS CORONAVIRUS 2 (TAT 6-24 HRS) Nasopharyngeal Nasopharyngeal Swab     Status: None   Collection Time: 04/20/19  9:24 AM    Specimen: Nasopharyngeal Swab  Result Value Ref Range   SARS Coronavirus 2 NEGATIVE NEGATIVE    Comment: (NOTE) SARS-CoV-2 target nucleic acids are NOT DETECTED. The SARS-CoV-2 RNA is generally  detectable in upper and lower respiratory specimens during the acute phase of infection. Negative results do not preclude SARS-CoV-2 infection, do not rule out co-infections with other pathogens, and should not be used as the sole basis for treatment or other patient management decisions. Negative results must be combined with clinical observations, patient history, and epidemiological information. The expected result is Negative. Fact Sheet for Patients: SugarRoll.be Fact Sheet for Healthcare Providers: https://www.woods-mathews.com/ This test is not yet approved or cleared by the [place] FDA and  has been authorized for detection and/or diagnosis of SARS-CoV-2 by FDA under an Emergency Use Authorization (EUA). This EUA will remain  in effect (meaning this test can be used) for the duration of the COVID-19 declaration under Section 56 4(b)(1) of the Act, 21 U.S.C. section 360bbb-3(b)(1), unless the authorization is terminated or revoked sooner. Performed at [facility], [place] [address], [place], [place] [address]    DG Chest 2 View  Result Date: 04/20/2019 CLINICAL DATA:  Chest pain. Nausea and vomiting. EXAM: CHEST - 2 VIEW COMPARISON:  Radiograph 04/02/2019 FINDINGS: Right dialysis catheter unchanged. Mild cardiomegaly is unchanged. No pulmonary edema or acute airspace disease. No pleural fluid or pneumothorax. No acute osseous abnormalities. IMPRESSION: Stable mild cardiomegaly. No acute abnormality. Electronically Signed   By: Keith Rake M.D.   On: 04/20/2019 06:14    Pending Labs Unresulted Labs (From admission, onward)    Start     Ordered   04/21/19 0500  CBC  Daily,   R     04/20/19 1305   04/20/19 1309  Lipase, blood   Add-on,   AD     04/20/19 1308   04/20/19 1309  Hepatic function panel  Add-on,   AD     04/20/19 1308   Signed and Held  CBC  Once,   R     Signed and Held          Vitals/Pain Today's Vitals   04/20/19 1045 04/20/19 1130 04/20/19 1445 04/20/19 1515  BP: (!) 170/110 119/86 (!) 208/113 (!) 225/90  Pulse: (!) 115 (!) 118 (!) 119 (!) 102  Resp: (!) 21 (!) 22 (!) 24 (!) 25  Temp:      SpO2: 100% 100% 100% 100%  Weight:      Height:      PainSc:        Isolation Precautions No active isolations  Medications Medications  promethazine (PHENERGAN) injection 12.5 mg (has no administration in time range)  labetalol (NORMODYNE) injection 5 mg (5 mg Intravenous Given 04/20/19 1511)  insulin aspart (novoLOG) injection 0-9 Units (has no administration in time range)  insulin aspart (novoLOG) injection 0-5 Units (has no administration in time range)  heparin injection 5,000 Units (5,000 Units Subcutaneous Given 04/20/19 1429)  Chlorhexidine Gluconate Cloth 2 % PADS 6 each (has no administration in time range)  sodium chloride 0.9 % bolus 1,000 mL (0 mLs Intravenous Stopped 04/20/19 1513)  ondansetron (ZOFRAN) injection 4 mg (4 mg Intravenous Given 04/20/19 0920)  pantoprazole (PROTONIX) injection 40 mg (40 mg Intravenous Given 04/20/19 0921)  promethazine (PHENERGAN) injection 12.5 mg (12.5 mg Intravenous Given 04/20/19 1248)  sodium chloride 0.9 % bolus 500 mL (500 mLs Intravenous New Bag/Given 04/20/19 1512)    Mobility walks Low fall risk   Focused Assessments    R Recommendations: See Admitting Provider Note  Report given to: Mariea Clonts 5M RN  Additional Notes:

## 2019-04-20 NOTE — Progress Notes (Signed)
Spoke with Dr. Royce Macadamia with concerns of patient's continued elevated Bps and inability to tolerate PO.  She has received labetalol 5mg  IV, was due for repeat dose, and Dr. Royce Macadamia added clonidine patch 0.1mg  and hydralazine 10mg  x1.  She is due for HD today as well and Dr. Royce Macadamia is unsure when this will occur and if it will help very much given she seems more fluid down. Agree with this and given inability for FPTS to manage drips on the floor for BP, will consult PCCM.  PCCM consulted and agree to see patient.  Order placed.  Will continue to monitor closely.  [place] Constable, D.O.  PGY-2 Family Medicine  04/20/2019 4:09 PM

## 2019-04-21 LAB — RENAL FUNCTION PANEL
Albumin: 3.6 g/dL (ref 3.5–5.0)
Anion gap: 21 — ABNORMAL HIGH (ref 5–15)
BUN: 28 mg/dL — ABNORMAL HIGH (ref 6–20)
CO2: 21 mmol/L — ABNORMAL LOW (ref 22–32)
Calcium: 9.3 mg/dL (ref 8.9–10.3)
Chloride: 97 mmol/L — ABNORMAL LOW (ref 98–111)
Creatinine, Ser: 7.06 mg/dL — ABNORMAL HIGH (ref 0.44–1.00)
GFR calc Af Amer: 7 mL/min — ABNORMAL LOW (ref >60)
GFR calc non Af Amer: 6 mL/min — ABNORMAL LOW (ref >60)
Glucose, Bld: 137 mg/dL — ABNORMAL HIGH (ref 70–99)
Phosphorus: 5.9 mg/dL — ABNORMAL HIGH (ref 2.5–4.6)
Potassium: 3.8 mmol/L (ref 3.5–5.1)
Sodium: 139 mmol/L (ref 135–145)

## 2019-04-21 LAB — CBC
HCT: 44.2 % (ref 36.0–46.0)
Hemoglobin: 13.8 g/dL (ref 12.0–15.0)
MCH: 26.3 pg (ref 26.0–34.0)
MCHC: 31.2 g/dL (ref 30.0–36.0)
MCV: 84.4 fL (ref 80.0–100.0)
Platelets: 388 10*3/uL (ref 150–400)
RBC: 5.24 MIL/uL — ABNORMAL HIGH (ref 3.87–5.11)
RDW: 14.6 % (ref 11.5–15.5)
WBC: 11.5 10*3/uL — ABNORMAL HIGH (ref 4.0–10.5)
nRBC: 0 % (ref 0.0–0.2)

## 2019-04-21 LAB — GLUCOSE, CAPILLARY
Glucose-Capillary: 126 mg/dL — ABNORMAL HIGH (ref 70–99)
Glucose-Capillary: 150 mg/dL — ABNORMAL HIGH (ref 70–99)
Glucose-Capillary: 154 mg/dL — ABNORMAL HIGH (ref 70–99)
Glucose-Capillary: 102 mg/dL — ABNORMAL HIGH (ref 70–99)
Glucose-Capillary: 121 mg/dL — ABNORMAL HIGH (ref 70–99)
Glucose-Capillary: 144 mg/dL — ABNORMAL HIGH (ref 70–99)

## 2019-04-21 MED ORDER — ACETAMINOPHEN 325 MG PO TABS
650.0000 mg | ORAL_TABLET | Freq: Four times a day (QID) | ORAL | Status: DC | PRN
  Administered 2019-04-21: 12:00:00 650 mg via ORAL
  Filled 2019-04-21: qty 2

## 2019-04-21 MED ORDER — HYDRALAZINE HCL 20 MG/ML IJ SOLN: 10.0000 mg | Freq: Four times a day (QID) | INTRAMUSCULAR | Status: DC | PRN

## 2019-04-21 MED ORDER — CARVEDILOL 25 MG PO TABS
25.0000 mg | ORAL_TABLET | Freq: Two times a day (BID) | ORAL | Status: DC
  Administered 2019-04-21 – 2019-04-22 (×3): 25 mg via ORAL
  Filled 2019-04-21 (×3): qty 1

## 2019-04-21 MED ORDER — CLONIDINE HCL 0.1 MG PO TABS
0.1000 mg | ORAL_TABLET | Freq: Two times a day (BID) | ORAL | Status: DC
  Administered 2019-04-21: 0.1 mg via ORAL
  Filled 2019-04-21: qty 1

## 2019-04-21 MED ORDER — HYDRALAZINE HCL 20 MG/ML IJ SOLN
10.0000 mg | Freq: Four times a day (QID) | INTRAMUSCULAR | Status: DC | PRN
  Administered 2019-04-21: 10 mg via INTRAVENOUS
  Filled 2019-04-21: qty 1

## 2019-04-21 NOTE — Progress Notes (Signed)
Family Medicine Teaching Service Daily Progress Note Intern Pager: [phone]  Patient name: [name] Medical record number: [id number] Date of birth: March 10, 1961 Age: 58 y.o. Gender: female  Primary Care Provider: Matilde Haymaker, MD Consultants: Nephrology Code Status: Full  Pt Overview and Major Events to Date:  04/20/2019-patient admitted for intractable nausea and vomiting  Assessment and Plan: [name] is a 58 y.o. female presenting with 3-day history of nausea and vomiting unable to tolerate p.o. intake.Marland Kitchen PMH is significant for end-stage renal disease, diabetes mellitus, diabetic gastroparesis, GERD, hypertension  Intractable N/V  Patient admitted after nausea and vomiting since Saturday 12/12.  She had not been tolerating any p.o. intake or oral medications.  Denied diarrhea.  She was afebrile with a WBC-14.  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  Patient had also missed a session of dialysis with the one prior to admission being on 12/11.  Patient was dialyzed on 12/15 after admission.  Lipase-83, LFTs within normal limits.  Nausea and vomiting improved this morning.  Patient requesting fluid by mouth. -Phenergan IV every 4 hours as needed -Continuous cardiac monitoring -Continuous pulse ox -PT/OT eval and treat -Strict I's and O's -Daily weights -Daily CBC, renal function panel  ESRD on HD MWF Patient on hemodialysis Monday Wednesday Friday.  She missed her appointment on 12/11.  Received dialysis 12/15.  Creatinine this morning was 7.6 with a BUN of 28.  Next dialysis scheduled for Friday 12/18. -Nephrology consulted, recommendations appreciated -Follow nephrology's schedule  Hypertensive emergency  On admission patient was hypertensive in the 170/122.  Her blood pressures continue to increase and got as high as the 220s/110s.  Patient was given labetalol 5 mg twice with little response.  CCM was consulted recommended labetalol 20 mg IV  every 6 hours, hydralazine 10 mg every 6 hours as needed, clonidine patch, enalapril.  They recommended SBP goal of 170-200 mmHg.  Patient's blood pressure subsequently dropped to 106/66.  Blood pressure medications were discontinued and patient's blood pressure was allowed to increase.  This morning blood pressures ranged in the 170s/80s.  Patient's Coreg 25 mg twice daily and clonidine 0.1 mg twice daily were started.  Patient was also started on hydralazine 10 mg every 6 as needed for systolic blood pressures greater than 190 -Continuous cardiac monitoring -Every 4 hour blood pressures -Continue Coreg 25 mg twice daily -Continue clonidine 0.1 mg twice daily -Continue hydralazine 10 mg every 6 hours as needed -Continue to slowly decrease systolic blood pressure  Tachycardia  Patient has not been taking medications prior to admission because of nausea and vomiting including her clonidine and Coreg.  Tachycardia was most likely due to medication noncompliance -Continuous cardiac monitoring  T2DM  Currently not taking any medication at home for this.  A1c on 11/4 was 5.1.  Was previously poorly controlled with A1c's over 10 in 2016 through 2019. -Sensitive sliding scale insulin with meals and nightly -CBG with meals and nightly  FEN/GI: Clear liquid diet PPx: Heparin   Disposition: Possible discharge tomorrow  Subjective:  Patient reports he is doing well this morning.  Objective: Temp:  [98 F (36.7 C)-98.3 F (36.8 C)] 98.3 F (36.8 C) (12/16 0445) Pulse Rate:  [102-126] 115 (12/16 0445) Resp:  [16-27] 23 (12/16 0445) BP: (100-229)/(63-132) 157/91 (12/16 0445) SpO2:  [97 %-100 %] 99 % (12/16 0445) Weight:  [104 kg-104.8 kg] 104 kg (12/16 0223) General: NAD, resting comfortably in bed with occupational therapy in the room  when I arrived. HEENT: Atraumatic. Normocephalic  Cardiac: RRR, no m/r/g Respiratory: CTAB, normal work of breathing Abdomen: soft, nontender, nondistended,  bowel sounds normal Skin: warm and dry, no rashes noted Neuro: alert and oriented    Laboratory: Recent Labs  Lab 04/20/19 0557 04/21/19 0345  WBC 14.4* 11.5*  HGB 14.5 13.8  HCT 47.2* 44.2  PLT 432* 388   Recent Labs  Lab 04/20/19 0557 04/20/19 0924 04/21/19 0345  NA 140  --  139  K 4.9  --  3.8  CL 97*  --  97*  CO2 20*  --  21*  BUN 71*  --  28*  CREATININE 13.47*  --  7.06*  CALCIUM 10.0  --  9.3  PROT  --  8.5*  --   BILITOT  --  0.2*  --   ALKPHOS  --  103  --   ALT  --  11  --   AST  --  21  --   GLUCOSE 187*  --  137*   Imaging/Diagnostic Tests: No results found.   Gifford Shave, MD 04/21/2019, 6:16 AM PGY-1, [facility] Intern pager: [phone], text pages welcome

## 2019-04-21 NOTE — Progress Notes (Signed)
BP 180/90 manually. Spoke to MD Posey Pronto. No new orders received at this time. Will continue to monitor.  Paulene Floor, RN

## 2019-04-21 NOTE — Progress Notes (Signed)
BP 185/110. New onset of headache and blurred vision left eye. Paged MD.  Paulene Floor, RN

## 2019-04-21 NOTE — Progress Notes (Signed)
Family Medicine progress note  Reviewed most recent BP. Dropped all the way to 106/66. Will take off scheduled labetolol, hydral, and clonidine patch as well as prns. Can gradually add on home medications in am of 12/16 if BP starts to increase.  Guadalupe Dawn MD PGY-3 Family Medicine Resident

## 2019-04-21 NOTE — Progress Notes (Signed)
PT Cancellation Note  Patient Details Name: [name] MRN: [id number] DOB: 1960/08/08   Cancelled Treatment:    Reason Eval/Treat Not Completed: Patient declined, no reason specified. PT attempted to see patient however patient declining, reporting she still feels unwell since attempting to mobilize this morning. Pt reports dizziness has not resolved despite BP remaining stable for the last few hours. Pt refusing attempts at mobility at this time despite PT education on importance of functional mobility to maintain strength and activity tolerance. PT will attempt to follow up as time allows.   Zenaida Niece 04/21/2019, 2:28 PM

## 2019-04-21 NOTE — Evaluation (Signed)
Occupational Therapy Evaluation Patient Details Name: [name] MRN: [id number] DOB: Sep 20, 1960 Today's Date: 04/21/2019    History of Present Illness [name] is a 58 y.o. female presenting with 3-day history of nausea and vomiting unable to tolerate p.o. intake.Marland Kitchen PMH is significant for end-stage renal disease, diabetes mellitus, diabetic gastroparesis, GERD, hypertension   Clinical Impression   Patient is a 58 year old female that lives in an apartment with her son, 10 steps to 2nd floor. Patient is independent at baseline, was working as Licensed conveyancer full time. Patient modified independent with bed mobility however with sitting upright pt systolic pressure dropped by 59 points and patient symptomatic with dizziness. BP taken again approximately 4 mins later with systolic increasing by 31 points however patient still symptomatic with RN requesting to let patient rest. Patient had no balance deficits sitting at EOB and reports using bathroom with nursing staff this morning without physical assist therefore anticipate no OT needs as patient stabilizes medically. Will continue to follow acute OT to ensure safety with balance, self care tasks.    Follow Up Recommendations  No OT follow up;Supervision - Intermittent;Other (comment)(anticipate no needs as patient stabilizes medically)    Equipment Recommendations  None recommended by OT       Precautions / Restrictions Precautions Precautions: Fall Restrictions Weight Bearing Restrictions: No      Mobility Bed Mobility Overal bed mobility: Modified Independent             General bed mobility comments: HOB elevated  Transfers                 General transfer comment: deferred due to dizziness    Balance Overall balance assessment: Needs assistance Sitting-balance support: Feet supported;No upper extremity supported Sitting balance-Leahy Scale: Good Sitting balance - Comments: able to maintain sitting balance  despite dizziness       Standing balance comment: deferredd/t dizziness                           ADL either performed or assessed with clinical judgement   ADL Overall ADL's : Needs assistance/impaired     Grooming: Wash/dry face;Set up;Sitting   Upper Body Bathing: Set up;Sitting   Lower Body Bathing: Minimal assistance;Sitting/lateral leans   Upper Body Dressing : Set up;Sitting   Lower Body Dressing: Minimal assistance;Sitting/lateral leans Lower Body Dressing Details (indicate cue type and reason): dizziness with sitting upright   Toilet Transfer Details (indicate cue type and reason): unable to assess as patient with persistent dizziness with sitting up, reports using bathroom earlier without physical assist. ancitipate Ax1           General ADL Comments: unable to prgress beyond EOB due to orthostatic hypotension     Vision Baseline Vision/History: No visual deficits Patient Visual Report: No change from baseline              Pertinent Vitals/Pain Pain Assessment: No/denies pain     Hand Dominance Right   Extremity/Trunk Assessment Upper Extremity Assessment Upper Extremity Assessment: Overall WFL for tasks assessed   Lower Extremity Assessment Lower Extremity Assessment: Defer to PT evaluation   Cervical / Trunk Assessment Cervical / Trunk Assessment: Normal   Communication Communication Communication: No difficulties   Cognition Arousal/Alertness: Awake/alert Behavior During Therapy: WFL for tasks assessed/performed Overall Cognitive Status: Within Functional Limits for tasks assessed  General Comments  BP semi-supine in bed 180/86, BP EOB 121/66, EOB ~4 mins 152/86 with persistent dizziness            Home Living Family/patient expects to be discharged to:: Private residence Living Arrangements: Children Available Help at Discharge: Family;Available PRN/intermittently Type  of Home: Apartment Home Access: Stairs to enter [employer] of Steps: 2nd floor apartment, 10 steps going up Entrance Stairs-Rails: Can reach both Home Layout: One level     Bathroom Shower/Tub: Teacher, early years/pre: Standard Bathroom Accessibility: Yes How Accessible: Accessible via walker Home Equipment: None          Prior Functioning/Environment Level of Independence: Independent        Comments: pt has been unable to return to work since last admission        OT Problem List: Impaired balance (sitting and/or standing);Decreased activity tolerance      OT Treatment/Interventions: Self-care/ADL training;Therapeutic exercise;Energy conservation;DME and/or AE instruction;Therapeutic activities;Patient/family education;Balance training    OT Goals(Current goals can be found in the care plan section) Acute Rehab OT Goals Patient Stated Goal: to go home OT Goal Formulation: With patient Time For Goal Achievement: 05/05/19 Potential to Achieve Goals: Good  OT Frequency: Min 2X/week    AM-PAC OT "6 Clicks" Daily Activity     Outcome Measure Help from another person eating meals?: None Help from another person taking care of personal grooming?: None Help from another person toileting, which includes using toliet, bedpan, or urinal?: None Help from another person bathing (including washing, rinsing, drying)?: None Help from another person to put on and taking off regular upper body clothing?: None Help from another person to put on and taking off regular lower body clothing?: None 6 Click Score: 24   End of Session Nurse Communication: Mobility status;Other (comment)(BP)  Activity Tolerance: Treatment limited secondary to medical complications (Comment)(dizziness/orthostatic with sitting up) Patient left: in bed;with call bell/phone within reach;with nursing/sitter in room  OT Visit Diagnosis: Unsteadiness on feet (R26.81)                Time:  1962-2297 OT Time Calculation (min): 23 min Charges:  OT General Charges $OT Visit: 1 Visit OT Evaluation $OT Eval Moderate Complexity: 1 Mod OT Treatments $Self Care/Home Management : 8-22 mins  Shon Millet OT OT office: [place] 04/21/2019, 1:11 PM

## 2019-04-21 NOTE — Progress Notes (Signed)
[facility] Progress Note   Dialysis Orders:  MWF [place] 4 hr 400/800 EDW 107.5 3 K 2.25 Ca heparin 2000 Mircera 50 q 2 weeks - due 12/14 (decreased from 100 11/30)  tsat 17% last hgb 10.7 iPTH 748 - [name] 3 on hold - not sure why - needs to be resumed- suspect had high correct Ca at some point -   Assessment/Plan: 1. Intractable N and V - recurrent; further evaluation and treatment by primary 2. ESRD -  MWF- plan cancel HD for today - volume and labs - ok to wait until Friday to get on back on schedule -  need to increase heparin at discharge to issues with system clotting  3. Hypertension/volume  - BP high - unable to keep down current meds prior to admission then dropped last night on HD  yesterday likely related to multiple IV BP meds and trying to UF - clonidine patch was added for slow release so she could have a BP med on board when she couldn't keep down pos after d/c and avoid rebound HD and tachycardia. Unfortunately net UF not recorded although there is only 0.8 between pre and post wt  Both which are below edw.- Agree with resuming PO meds gently  and has IV coverage as well  4. Anemia  - hgb ok - do not given ferrous sulfate- should not be resumed at d/c high hgb suggests she is dry considering last outpt Hgb was 10.7 - no ESA for now 5. Metabolic bone disease -  On Aurixya 2 ac - would hold till N and V resolve - resume hectorol at d/c  6. Nutrition - advance as tolerated - hold vitamins until N and V resolves 7. tachycardia due to med withdrawal - favor switch back  to clonidine patch in addition to BB 8. DM - per primary A1C 5.1   03/2019  Myriam Jacobson, PA-C [facility] Beeper [phone] 04/21/2019,12:11 PM  LOS: 1 day   Subjective:   C/o HA and blurred vision. Feels bad.  Objective Vitals:   04/21/19 0741 04/21/19 1113 04/21/19 1122 04/21/19 1129  BP: (!) 177/94 (!) 180/95 (!) 185/110 (!) 160/89  Pulse: (!) 119   (!) 112  Resp: (!) 21  (!) 24  (!) 25  Temp: 98.6 F (37 C)   98.6 F (37 C)  TempSrc: Oral   Oral  SpO2: 98%   98%  Weight:      Height:       Physical Exam General: NAD but feels bad Heart:tachy regular  Lungs: no rales Abdomen: obese soft NT Extremities: no edema Dialysis Access: right IJ TDC and left AVF + bruit   Additional Objective Labs: Basic Metabolic Panel: Recent Labs  Lab 04/20/19 0557 04/21/19 0345  NA 140 139  K 4.9 3.8  CL 97* 97*  CO2 20* 21*  GLUCOSE 187* 137*  BUN 71* 28*  CREATININE 13.47* 7.06*  CALCIUM 10.0 9.3  PHOS  --  5.9*   Liver Function Tests: Recent Labs  Lab 04/20/19 0924 04/21/19 0345  AST 21  --   ALT 11  --   ALKPHOS 103  --   BILITOT 0.2*  --   PROT 8.5*  --   ALBUMIN 4.0 3.6   Recent Labs  Lab 04/20/19 0924  LIPASE 83*   CBC: Recent Labs  Lab 04/20/19 0557 04/21/19 0345  WBC 14.4* 11.5*  HGB 14.5 13.8  HCT 47.2* 44.2  MCV 85.4 84.4  PLT 432* 388   Blood Culture    Component Value Date/Time   SDES BLOOD RIGHT ARM 04/02/2019 2055   SPECREQUEST AEROBIC BOTTLE ONLY Blood Culture adequate volume 04/02/2019 2055   CULT  04/02/2019 2055    NO GROWTH 5 DAYS Performed at [facility], [place] [address], [place], [place] [address]    REPTSTATUS 04/07/2019 FINAL 04/02/2019 2055    Cardiac Enzymes: No results for input(s): CKTOTAL, CKMB, CKMBINDEX, TROPONINI in the last 168 hours. CBG: Recent Labs  Lab 04/20/19 2120 04/21/19 0141 04/21/19 0227 04/21/19 0606 04/21/19 1130  GLUCAP 113* 126* 150* 154* 102*   Iron Studies: No results for input(s): IRON, TIBC, TRANSFERRIN, FERRITIN in the last 72 hours. Lab Results  Component Value Date   INR 1.1 02/08/2019   INR 1.1 02/03/2019   INR 1.0 07/08/2018   Studies/Results: DG Chest 2 View  Result Date: 04/20/2019 CLINICAL DATA:  Chest pain. Nausea and vomiting. EXAM: CHEST - 2 VIEW COMPARISON:  Radiograph 04/02/2019 FINDINGS: Right dialysis catheter unchanged. Mild cardiomegaly  is unchanged. No pulmonary edema or acute airspace disease. No pleural fluid or pneumothorax. No acute osseous abnormalities. IMPRESSION: Stable mild cardiomegaly. No acute abnormality. Electronically Signed   By: Keith Rake M.D.   On: 04/20/2019 06:14   Medications: . sodium chloride    . sodium chloride     . carvedilol  25 mg Oral BID WC  . Chlorhexidine Gluconate Cloth  6 each Topical Q0600  . heparin injection (subcutaneous)  5,000 Units Subcutaneous Q8H  . insulin aspart  0-5 Units Subcutaneous QHS  . insulin aspart  0-9 Units Subcutaneous TID WC

## 2019-04-21 NOTE — Discharge Summary (Addendum)
[facility] Discharge Summary  Patient name: [name] Medical record number: [id number] Date of birth: [birth year] Age: 57 y.o. Gender: female Date of Admission: 04/20/2019  Date of Discharge: 04/22/2019 Admitting Physician: Benay Pike, MD  Primary Care Provider: Matilde Haymaker, MD Consultants: Nephrology  Indication for Hospitalization: Hypertensive emergency  Discharge Diagnoses/Problem List:  Intractable nausea and vomiting ESRD on HD Monday Wednesday Friday T2DM Hypertensive emergency Tachycardia  Disposition: Home  Discharge Condition: Stable  Discharge Exam:  Temp:  [98.3 F (36.8 C)-98.8 F (37.1 C)] 98.3 F (36.8 C) (12/17 0510) Pulse Rate:  [25-119] 25 (12/16 2359) Resp:  [15-25] 15 (12/17 0510) BP: (99-185)/(62-110) 99/62 (12/17 0510) SpO2:  [94 %-99 %] 97 % (12/17 0510) General: NAD, resting comfortably in bed when I enter the room. HEENT: Atraumatic. Normocephalic  Cardiac: RRR, no m/r/g  Respiratory: Clear to auscultation bilaterally, normal work of breathing Abdomen: soft, nontender, nondistended, bowel sounds normal Skin: warm and dry, no rashes noted, minimal lower extremity edema noted Neuro: alert and oriented  Brief Hospital Course:   Intractable nausea and vomiting Patient presented to the emergency department after 4-day history of intractable nausea and vomiting.  She reports that she was unable to keep any of her medications down and was unable to keep anything else down by mouth.  She was afebrile with a WBC-14.  Patient's BUN was in the 70s.  She had missed a dialysis session.  She noted that she has history of gastroparesis and felt like this may be a flare of her gastroparesis.  Patient was made n.p.o. and given Phenergan IV every 4 hours as needed for the nausea.  On 12/16 the patient's nausea and vomiting have gotten better and she was requesting p.o. liquids and then p.o. solids.  At time of discharge she was  tolerating a solid diet, vitals were stable, and she was much improved.  Hypertensive emergency On presentation patient's blood pressure was elevated in the 170s over 120s.  The blood pressures continue to increase to the 220s/110s.  Patient received labetalol 5 mg with little response.  CCM was consulted and gave 20 mg labetalol IV every 6 hours, hydralazine 10 mg every 6 hours as needed.  Restarted patient's clonidine in patch form rather than oral and started enalapril with a systolic blood pressure goal of 170-200 mmHg.  Patient's blood pressure dropped to the 90s/60s.  All blood pressure medications were held and the patient's blood pressure came back to the 170s/90s.  Blood pressure medications were slowly restarted allowing for some hypertension and a slow decrease in the systolic blood pressure.  On discharge the patient was released on 25 mg of Coreg twice daily.  All other blood pressure medications were held given the patient is normotensive pressures.  Her pressures were stable at the time of discharge.  ESRD Patient on admission had admitted that she had missed her previous dialysis session.  Nephrology was consulted and gave her a session of dialysis on 12/15.  Next session of dialysis was scheduled for 12/18 at her outpatient dialysis facility.  Issues for Follow Up:  Blood pressure management.  Patient was discharged on Coreg 25 mg grams twice daily.  All other pressure medications were held due to hypotension. Consider not restarting clonidine given patient's issues with home medication management in the possibility for refractory hypertension.  Hydralazine would likely be a better option for this patient as needed, but would first recommend restarting other previous medications, including norvasc. Follow-up  on outpatient dialysis schedule and ensure patient has adequate support to not miss those appointments Regarding the nausea and vomiting appears to be cyclic in nature.  This seems  to occur after trips to [facility] which resulted in her missing her nighttime medications.  Recommending adjusting so that she does not miss nighttime medications.  Advised to keep a food diary for this.  Significant Procedures:  04/20/2019-hemodialysis  Significant Labs and Imaging:  Recent Labs  Lab 04/20/19 0557 04/21/19 0345 04/22/19 0321  WBC 14.4* 11.5* 9.2  HGB 14.5 13.8 12.6  HCT 47.2* 44.2 40.6  PLT 432* 388 343   Recent Labs  Lab 04/20/19 0557 04/20/19 0924 04/21/19 0345 04/22/19 0812  NA 140  --  139 135  K 4.9  --  3.8 4.3  CL 97*  --  97* 93*  CO2 20*  --  21* 22  GLUCOSE 187*  --  137* 97  BUN 71*  --  28* 52*  CREATININE 13.47*  --  7.06* 11.12*  CALCIUM 10.0  --  9.3 9.3  PHOS  --   --  5.9* 11.2*  ALKPHOS  --  103  --   --   AST  --  21  --   --   ALT  --  11  --   --   ALBUMIN  --  4.0 3.6 3.4*    Results/Tests Pending at Time of Discharge: None  Discharge Medications:  Allergies as of 04/22/2019       Reactions   Lisinopril Anaphylaxis, Other (See Comments)   angioedema   Camellia Swelling, Other (See Comments)   Angioedema    Jardiance [empagliflozin] Swelling, Rash        Medication List     STOP taking these medications    amLODipine 10 MG tablet Commonly known as: NORVASC   Auryxia 1 GM 210 MG(Fe) tablet Generic drug: ferric citrate   cloNIDine 0.2 MG tablet Commonly known as: CATAPRES   ferrous sulfate 325 (65 FE) MG tablet   furosemide 40 MG tablet Commonly known as: LASIX   potassium chloride SA 20 MEQ tablet Commonly known as: KLOR-CON   traMADol 50 MG tablet Commonly known as: Ultram       TAKE these medications    Accu-Chek Aviva Plus w/Device Kit Use to check sugar three times a day   accu-chek soft touch lancets Use to check sugars three times a day   Accu-Chek Softclix Lancets lancets Use as instructed   acetaminophen 500 MG tablet Commonly known as: TYLENOL Take 1,000 mg by mouth every 6  (six) hours as needed for moderate pain or headache.   aspirin EC 325 MG tablet Take 325 mg by mouth daily.   atorvastatin 20 MG tablet Commonly known as: LIPITOR TAKE 1 TABLET BY MOUTH ONCE DAILY AT  6PM What changed: See the new instructions.   carvedilol 25 MG tablet Commonly known as: COREG Take 1 tablet (25 mg total) by mouth 2 (two) times daily with a meal.   Dialyvite 800 0.8 MG Tabs Take 1 tablet by mouth daily.   glucose blood test strip Commonly known as: Accu-Chek Aviva Plus Use as instructed   polyethylene glycol 17 g packet Commonly known as: MIRALAX / GLYCOLAX Take 17 g by mouth daily as needed for moderate constipation.   promethazine 25 MG suppository Commonly known as: PHENERGAN Place 25 mg rectally every 6 (six) hours as needed for nausea/vomiting.   Vitamin D (Ergocalciferol) 1.25  MG (50000 UT) Caps capsule Commonly known as: DRISDOL Take 50,000 Units by mouth every Monday.        Discharge Instructions: Please refer to Patient Instructions section of EMR for full details.  Patient was counseled important signs and symptoms that should prompt return to medical care, changes in medications, dietary instructions, activity restrictions, and follow up appointments.   Follow-Up Appointments: Follow-up Information     [place]. Go on 04/27/2019.   Why: at 1:55pm.  Please arrive 15 minutes early.  If you have symptoms of COVID-19 including cough, fever, shortness of breath, loss of taste or smell, DO NOT come to clinic.  Call us and see if you are still eligible for an in-person appointment. Contact information: [place]           Gifford Shave, MD 04/24/2019, 3:30 PM PGY-1, [place] Upper-Level Resident Addendum   I have independently interviewed and examined the patient. I have discussed the above with the original author and agree with  their documentation. My edits for correction/addition/clarification have been made. Please see also any attending notes.   [place] Constable, D.O. PGY-2, [facility] 04/24/2019 5:35 PM

## 2019-04-22 ENCOUNTER — Encounter (HOSPITAL_COMMUNITY): Payer: Self-pay | Admitting: Family Medicine

## 2019-04-22 DIAGNOSIS — I158 Other secondary hypertension: Secondary | ICD-10-CM

## 2019-04-22 LAB — CBC
HCT: 40.6 % (ref 36.0–46.0)
Hemoglobin: 12.6 g/dL (ref 12.0–15.0)
MCH: 26 pg (ref 26.0–34.0)
MCHC: 31 g/dL (ref 30.0–36.0)
MCV: 83.7 fL (ref 80.0–100.0)
Platelets: 343 10*3/uL (ref 150–400)
RBC: 4.85 MIL/uL (ref 3.87–5.11)
RDW: 14.5 % (ref 11.5–15.5)
WBC: 9.2 10*3/uL (ref 4.0–10.5)
nRBC: 0 % (ref 0.0–0.2)

## 2019-04-22 LAB — RENAL FUNCTION PANEL
Albumin: 3.4 g/dL — ABNORMAL LOW (ref 3.5–5.0)
Anion gap: 20 — ABNORMAL HIGH (ref 5–15)
BUN: 52 mg/dL — ABNORMAL HIGH (ref 6–20)
CO2: 22 mmol/L (ref 22–32)
Calcium: 9.3 mg/dL (ref 8.9–10.3)
Chloride: 93 mmol/L — ABNORMAL LOW (ref 98–111)
Creatinine, Ser: 11.12 mg/dL — ABNORMAL HIGH (ref 0.44–1.00)
GFR calc Af Amer: 4 mL/min — ABNORMAL LOW (ref >60)
GFR calc non Af Amer: 3 mL/min — ABNORMAL LOW (ref >60)
Glucose, Bld: 97 mg/dL (ref 70–99)
Phosphorus: 11.2 mg/dL — ABNORMAL HIGH (ref 2.5–4.6)
Potassium: 4.3 mmol/L (ref 3.5–5.1)
Sodium: 135 mmol/L (ref 135–145)

## 2019-04-22 LAB — GLUCOSE, CAPILLARY
Glucose-Capillary: 121 mg/dL — ABNORMAL HIGH (ref 70–99)
Glucose-Capillary: 159 mg/dL — ABNORMAL HIGH (ref 70–99)
Glucose-Capillary: 138 mg/dL — ABNORMAL HIGH (ref 70–99)

## 2019-04-22 MED ORDER — CHLORHEXIDINE GLUCONATE CLOTH 2 % EX PADS: 6.0000 | MEDICATED_PAD | Freq: Every day | CUTANEOUS | Status: DC

## 2019-04-22 NOTE — Discharge Instructions (Signed)
Thank you for allowing [place] to participate in your care!    You were admitted for hypertensive emergency after you missed dialysis.  He required multiple blood pressure medications and dialysis but your blood pressure was finally controlled.  We discontinued a few of your blood pressure medications and would not like you to take them after discharge.  This includes clonidine and Norvasc.  Please continue to keep track of your blood pressures if possible and you will have close follow-up with dialysis as well as with our family medicine clinic to determine if you need to add some of your blood pressure medications back on.  Your appointment with the family medicine clinic is on Tuesday, December 22 at 1:55 PM.  If you have any symptoms such as cough, fever, shortness of breath please call the clinic to determine if you are still eligible for an in person visit.  Other medications that we stopped include your Kader and your Lorin Picket.  These may need to be restarted later but hold them at this time because of nephrology recommendations. Regarding your dialysis, your next dialysis session is tomorrow, Friday, December 17 at your regular dialysis center.  If you experience worsening of your admission symptoms, develop shortness of breath, life threatening emergency, suicidal or homicidal thoughts you must seek medical attention immediately by calling 911 or calling your MD immediately  if symptoms less severe.

## 2019-04-22 NOTE — Progress Notes (Signed)
[facility] Progress Note   Subjective:   Seen and examined at bedside.  Reports she is feeling better today.  Hoping to eat lunch.  Denies n/v/d, abdominal pain, SOB and edema.  BP 117/79 on exam.   Objective Vitals:   04/21/19 2359 04/22/19 0510 04/22/19 0800 04/22/19 1111  BP: 128/68 99/62 109/66 115/62  Pulse: (!) 25  79 82  Resp: 19 15 19 19   Temp: 98.4 F (36.9 C) 98.3 F (36.8 C) 98.1 F (36.7 C) 98.1 F (36.7 C)  TempSrc: Oral Oral Oral Oral  SpO2: 94% 97%  97%  Weight:      Height:       Physical Exam General:NAD, well appearing female Heart:RRR, no mrg Lungs:CTAB Abdomen:soft, NTND Extremities:no LE edema Dialysis Access: LU AVF maturing +b, [facility]   Filed Weights   04/20/19 0523 04/20/19 2214 04/21/19 0223  Weight: 106.6 kg 104.8 kg 104 kg    Intake/Output Summary (Last 24 hours) at 04/22/2019 1430 Last data filed at 04/22/2019 1300 Gross per 24 hour  Intake 600 ml  Output --  Net 600 ml    Additional Objective Labs: Basic Metabolic Panel: Recent Labs  Lab 04/20/19 0557 04/21/19 0345 04/22/19 0812  NA 140 139 135  K 4.9 3.8 4.3  CL 97* 97* 93*  CO2 20* 21* 22  GLUCOSE 187* 137* 97  BUN 71* 28* 52*  CREATININE 13.47* 7.06* 11.12*  CALCIUM 10.0 9.3 9.3  PHOS  --  5.9* 11.2*   Liver Function Tests: Recent Labs  Lab 04/20/19 0924 04/21/19 0345 04/22/19 0812  AST 21  --   --   ALT 11  --   --   ALKPHOS 103  --   --   BILITOT 0.2*  --   --   PROT 8.5*  --   --   ALBUMIN 4.0 3.6 3.4*   Recent Labs  Lab 04/20/19 0924  LIPASE 83*   CBC: Recent Labs  Lab 04/20/19 0557 04/21/19 0345 04/22/19 0321  WBC 14.4* 11.5* 9.2  HGB 14.5 13.8 12.6  HCT 47.2* 44.2 40.6  MCV 85.4 84.4 83.7  PLT 432* 388 343   Blood Culture    Component Value Date/Time   SDES BLOOD RIGHT ARM 04/02/2019 2055   SPECREQUEST AEROBIC BOTTLE ONLY Blood Culture adequate volume 04/02/2019 2055   CULT  04/02/2019 2055    NO GROWTH 5  DAYS Performed at [facility], [place] [address] [place], [place] [address]    REPTSTATUS 04/07/2019 FINAL 04/02/2019 2055    CBG: Recent Labs  Lab 04/21/19 1130 04/21/19 1609 04/21/19 2123 04/22/19 0610 04/22/19 1113  GLUCAP 102* 121* 144* 121* 159*    Lab Results  Component Value Date   INR 1.1 02/08/2019   INR 1.1 02/03/2019   INR 1.0 07/08/2018   Studies/Results: No results found.  Medications: . sodium chloride    . sodium chloride     . carvedilol  25 mg Oral BID WC  . Chlorhexidine Gluconate Cloth  6 each Topical Q0600  . heparin injection (subcutaneous)  5,000 Units Subcutaneous Q8H  . insulin aspart  0-5 Units Subcutaneous QHS  . insulin aspart  0-9 Units Subcutaneous TID WC    Dialysis Orders: MWF [place] 4 hr 400/800 EDW 107.5 3 K 2.25 Ca heparin 2000 Mircera 50 q 2 weeks - due 12/14 (decreased from 100 11/30) tsat 17% last hgb 10.7 iPTH 748 - [name] 3 on hold - not sure why -  needs to be resumed- suspect had high correct Ca at some point -  Assessment/Plan: 1. Intractable N and V - recurrent; improved today, wanting to try to eat lunch,  further evaluation and management by primary 2. ESRD- MWF- HD yesterday cancelled, labs & volume status ok, Next HD on Friday per regular schedule-  need to increase heparin at discharge to issues with system clotting 3. Hypertension/volume- BP improved today.  Clonidine restarted yesterday but d/c due to hypotension.  Currently on 25mg  Coreg.  Under dry weight, continue to titrate down as tolerated.  Does not appear volume overloaded.  Will need new EDW on d/c.  4. Anemia- hgb 12.6 - do not given ferrous sulfate- should not be resumed at d/c high hgb suggests she is dry considering last outpt Hgb was 10.7 - no ESA for now 5. Metabolic bone disease- On Aurixya 2 ac - would hold till N and V resolve - resume hectorol at d/c 6. Nutrition- advance as tolerated - hold vitamins until N and V  resolves 7. tachycardia due to med withdrawal - favor switch back  to clonidine patch in addition to BB 8. DM - per primary A1C 5.1 03/2019  Jen Mow, PA-C [facility] Pager: [phone] 04/22/2019,2:30 PM  LOS: 2 days

## 2019-04-22 NOTE — Progress Notes (Signed)
Family Medicine Teaching Service Daily Progress Note Intern Pager: [phone]  Patient name: [name] Medical record number: [id number] Date of birth: [birth year] Age: 58 y.o. Gender: female  Primary Care Provider: Matilde Haymaker, MD Consultants: Nephrology Code Status: Full  Pt Overview and Major Events to Date:  04/20/2019-patient admitted for intractable nausea and vomiting  Assessment and Plan: [name] is a 58 y.o. female presenting with 3-day history of nausea and vomiting unable to tolerate p.o. intake.Marland Kitchen PMH is significant for end-stage renal disease, diabetes mellitus, diabetic gastroparesis, GERD, hypertension  Intractable N/V  Patient admitted after nausea and vomiting since Saturday 12/12.  She had not been tolerating any p.o. intake or oral medications.  Denied diarrhea.  She was afebrile with a WBC-14.  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  Patient had also missed a session of dialysis with the one prior to admission being on 12/11.  Patient was dialyzed on 12/15 after admission.  Lipase-83, LFTs within normal limits.  Nausea and vomiting improved medically this morning and patient wants solid food.  WBC this morning 9.2 -Phenergan IV every 6 hours as needed -Continuous cardiac monitoring -Continuous pulse ox -PT/OT eval and treat -Strict I's and O's -Daily weights -Daily CBC, renal function panel  ESRD on HD MWF Patient on hemodialysis Monday Wednesday Friday.  She missed her appointment on 12/11.  Received dialysis 12/15.  Creatinine this morning was 11.12 with a BUN of 28.  Next dialysis scheduled for Friday 12/18. -Nephrology consulted, recommendations appreciated -Follow nephrology's schedule  Hypertensive emergency  On admission patient was hypertensive in the 170/122.  Her blood pressures continue to increase and got as high as the 220s/110s.  Patient was given labetalol 5 mg twice with little response.  CCM was consulted  recommended labetalol 20 mg IV every 6 hours, hydralazine 10 mg every 6 hours as needed, clonidine patch, enalapril.  They recommended SBP goal of 170-200 mmHg.  Patient's blood pressure medications have been adjusted over the last 24 hours.  Pressures over the last 24 hours have ranged from 109/66-115/62.  They are restarting patient's home Coreg but holding all other blood pressure medications at this time. -Continuous cardiac monitoring -Every 4 hour blood pressures -Continue Coreg 25 mg twice daily -Holding clonidine at this time -Holding hydralazine at this time  Tachycardia  Patient has not been taking medications prior to admission because of nausea and vomiting including her clonidine and Coreg.  Tachycardia was most likely due to medication noncompliance.  Pulse rate over the last 24 hours is ranged from 79-82. -Continuous cardiac monitoring   T2DM  Currently not taking any medication at home for this.  A1c on 11/4 was 5.1.  Was previously poorly controlled with A1c's over 10 in 2016 through 2019. -Sensitive sliding scale insulin with meals and nightly -CBG with meals and nightly  FEN/GI: Clear liquid diet PPx: Heparin   Disposition: Possible discharge tomorrow  Subjective:  Patient reports she is feeling much better this morning.  She is wanting some solid food.  Denies any pain anywhere.  Has questions regarding her dialysis schedule.  Objective: Temp:  [98.3 F (36.8 C)-98.8 F (37.1 C)] 98.3 F (36.8 C) (12/17 0510) Pulse Rate:  [25-119] 25 (12/16 2359) Resp:  [15-25] 15 (12/17 0510) BP: (99-185)/(62-110) 99/62 (12/17 0510) SpO2:  [94 %-99 %] 97 % (12/17 0510) General: NAD, resting comfortably in bed when I enter the room. HEENT: Atraumatic. Normocephalic  Cardiac: RRR, no m/r/g  Respiratory: Clear to auscultation bilaterally, normal work of breathing Abdomen: soft, nontender, nondistended, bowel sounds normal Skin: warm and dry, no rashes noted, minimal lower  extremity edema noted Neuro: alert and oriented    Laboratory: Recent Labs  Lab 04/20/19 0557 04/21/19 0345 04/22/19 0321  WBC 14.4* 11.5* 9.2  HGB 14.5 13.8 12.6  HCT 47.2* 44.2 40.6  PLT 432* 388 343   Recent Labs  Lab 04/20/19 0557 04/20/19 0924 04/21/19 0345  NA 140  --  139  K 4.9  --  3.8  CL 97*  --  97*  CO2 20*  --  21*  BUN 71*  --  28*  CREATININE 13.47*  --  7.06*  CALCIUM 10.0  --  9.3  PROT  --  8.5*  --   BILITOT  --  0.2*  --   ALKPHOS  --  103  --   ALT  --  11  --   AST  --  21  --   GLUCOSE 187*  --  137*   Imaging/Diagnostic Tests: No results found.   Gifford Shave, MD 04/22/2019, 6:21 AM PGY-1, [place] [place] [facility] Intern pager: [phone], text pages welcome

## 2019-04-22 NOTE — Progress Notes (Signed)
Pt discharged per MD order, tele removed,IV removed. Discharge instruction reviewed with daughter. Verbalized understanding no questions at this time. Pt escorted to personal vehicle via wheelchair.

## 2019-04-22 NOTE — Evaluation (Signed)
Physical Therapy Evaluation Patient Details Name: [name] MRN: [id number] DOB: 09/10/1960 Today's Date: 04/22/2019   History of Present Illness  [name] is a 58 y.o. female presenting with 3-day history of nausea and vomiting unable to tolerate p.o. intake.Marland Kitchen PMH is significant for end-stage renal disease, diabetes mellitus, diabetic gastroparesis, GERD, hypertension  Clinical Impression  Pt in bed upon PT arrival, agreeable to PT session focused on mobility progression. The pt was able to demo good bed mobility with modified independence (use of elevated HOB), as well as good static sitting balance EOB with supervision. However, further mobility limited by symptomatic orthostatic hypotension at this time. The pt will continue to benefit from skilled PT to address functional mobility and safety with mobility.   ORTHOSTATIC VITALS:  BP Supine: 100/65 BP Sitting EOB: 90/55 BP Sitting EOB (after 5 min): 75/25 with pt reports feeling "drunk" BP Supine: 93/78 (pt reports no sx after 3 min supine)    Follow Up Recommendations Home health PT;Supervision for mobility/OOB    Equipment Recommendations  (defer)    Recommendations for Other Services       Precautions / Restrictions Precautions Precautions: Fall Precaution Comments: OH!  (100/65 supine, 90/55 sitting EOB, 75/25 sitting EOB after 5 min) Restrictions Weight Bearing Restrictions: No      Mobility  Bed Mobility Overal bed mobility: Modified Independent             General bed mobility comments: HOB elevated  Transfers                 General transfer comment: deferred due to dizziness and BP of 75/25 when sitting EOB  Ambulation/Gait                Stairs            Wheelchair Mobility    Modified Rankin (Stroke Patients Only)       Balance Overall balance assessment: Needs assistance Sitting-balance support: Feet supported;No upper extremity supported Sitting balance-Leahy  Scale: Good Sitting balance - Comments: able to maintain sitting balance despite dizziness       Standing balance comment: deferred d/t dizziness                             Pertinent Vitals/Pain Pain Assessment: No/denies pain    Home Living Family/patient expects to be discharged to:: Unsure Living Arrangements: Children                    Prior Function                 Hand Dominance        Extremity/Trunk Assessment   Upper Extremity Assessment Upper Extremity Assessment: Overall WFL for tasks assessed    Lower Extremity Assessment Lower Extremity Assessment: Overall WFL for tasks assessed    Cervical / Trunk Assessment Cervical / Trunk Assessment: Normal  Communication      Cognition Arousal/Alertness: Awake/alert Behavior During Therapy: WFL for tasks assessed/performed Overall Cognitive Status: Within Functional Limits for tasks assessed                                        General Comments General comments (skin integrity, edema, etc.): BP suine: 100/65; BP sitting EOB: 90/55; BP sitting EOB after 5 min 75/25; BP supine: 93/78. RN alerted  Exercises General Exercises - Lower Extremity Ankle Circles/Pumps: AROM;Both;10 reps;Supine Short Arc Quad: AROM;Both;10 reps;Supine   Assessment/Plan    PT Assessment Patient needs continued PT services  PT Problem List Decreased mobility;Decreased range of motion;Decreased activity tolerance;Decreased balance;Cardiopulmonary status limiting activity       PT Treatment Interventions DME instruction;Therapeutic exercise;Gait training;Balance training;Stair training;Functional mobility training;Therapeutic activities;Patient/family education    PT Goals (Current goals can be found in the Care Plan section)  Acute Rehab PT Goals Patient Stated Goal: to go home PT Goal Formulation: With patient Time For Goal Achievement: 05/06/19 Potential to Achieve Goals: Good     Frequency Min 3X/week   Barriers to discharge        Co-evaluation               AM-PAC PT "6 Clicks" Mobility  Outcome Measure Help needed turning from your back to your side while in a flat bed without using bedrails?: None Help needed moving from lying on your back to sitting on the side of a flat bed without using bedrails?: None Help needed moving to and from a bed to a chair (including a wheelchair)?: A Lot Help needed standing up from a chair using your arms (e.g., wheelchair or bedside chair)?: A Lot Help needed to walk in hospital room?: A Lot Help needed climbing 3-5 steps with a railing? : A Lot 6 Click Score: 16    End of Session Equipment Utilized During Treatment: (none) Activity Tolerance: Treatment limited secondary to medical complications (Comment)(orthostatic) Patient left: in bed;with call bell/phone within reach(chair position) Nurse Communication: Mobility status(orthostatic vitals) PT Visit Diagnosis: Unsteadiness on feet (R26.81);Difficulty in walking, not elsewhere classified (R26.2)    Time: 1601-0932 PT Time Calculation (min) (ACUTE ONLY): 29 min   Charges:     PT Treatments $Therapeutic Activity: 23-37 mins        Karma Ganja, PT, DPT   Acute Rehabilitation Department [phone]  Otho Bellows 04/22/2019, 3:56 PM

## 2019-04-26 ENCOUNTER — Telehealth: Payer: Self-pay

## 2019-04-26 NOTE — Telephone Encounter (Signed)
Attempted to reach pt. To see if she would be able to see Dr. Pilar Plate tomorrow morning on the ATC schedule. If not she can stay with her 1:55 appt tomorrow with Dr. Ouida Sills. LVM to see if she would rather be seen in the morning with Dr. Pilar Plate on ATC, or keep her appt with Dr. Lemmie Evens. [place]. Salvatore Marvel, CMA

## 2019-04-27 ENCOUNTER — Inpatient Hospital Stay: Payer: BC Managed Care – PPO | Admitting: Family Medicine

## 2019-05-13 ENCOUNTER — Telehealth (HOSPITAL_COMMUNITY): Payer: Self-pay

## 2019-05-13 ENCOUNTER — Encounter: Payer: Self-pay | Admitting: Internal Medicine

## 2019-05-13 NOTE — Telephone Encounter (Signed)

## 2019-05-14 ENCOUNTER — Ambulatory Visit: Payer: BC Managed Care – PPO

## 2019-05-14 ENCOUNTER — Ambulatory Visit (INDEPENDENT_AMBULATORY_CARE_PROVIDER_SITE_OTHER): Payer: Self-pay | Admitting: Vascular Surgery

## 2019-05-14 ENCOUNTER — Encounter: Payer: Self-pay | Admitting: Vascular Surgery

## 2019-05-14 ENCOUNTER — Other Ambulatory Visit: Payer: Self-pay

## 2019-05-14 VITALS — BP 148/82 | HR 115 | Temp 98.0°F | Resp 20 | Ht 69.0 in | Wt 228.6 lb

## 2019-05-14 DIAGNOSIS — N186 End stage renal disease: Secondary | ICD-10-CM

## 2019-05-14 NOTE — Progress Notes (Signed)
    Subjective:     Patient ID: [name], female   DOB: [birth year], 59 y.o.   MRN: [id number]  HPI 59 year old female follows up after transposition of the left arm cephalic vein fistula.  She remains on dialysis via catheter and unfortunately had to have the catheter exchanged in the recent past.  Has no left arm symptoms and wounds are healing well.   Review of Systems No complaints today    Objective:   Physical Exam Vitals:   05/14/19 0956  BP: (!) 148/82  Pulse: (!) 115  Resp: 20  Temp: 98 F (36.7 C)  SpO2: 100%       Assessment/plan     59 year old female status post left arm cephalic vein fistula transposition.  Has very strong thrill previously had great flow by dialysis duplex.  She is okay to begin using the fistula and catheter can be removed when fistula is functioning well.      Valjean Ruppel C. Donzetta Matters, MD [facility] Office: [phone] Pager: [phone]

## 2019-06-06 DIAGNOSIS — N186 End stage renal disease: Secondary | ICD-10-CM | POA: Diagnosis not present

## 2019-06-06 DIAGNOSIS — I129 Hypertensive chronic kidney disease with stage 1 through stage 4 chronic kidney disease, or unspecified chronic kidney disease: Secondary | ICD-10-CM | POA: Diagnosis not present

## 2019-06-06 DIAGNOSIS — Z992 Dependence on renal dialysis: Secondary | ICD-10-CM | POA: Diagnosis not present

## 2019-06-07 ENCOUNTER — Other Ambulatory Visit (HOSPITAL_COMMUNITY): Payer: Self-pay | Admitting: Nephrology

## 2019-06-07 DIAGNOSIS — Z01810 Encounter for preprocedural cardiovascular examination: Secondary | ICD-10-CM

## 2019-06-07 DIAGNOSIS — Z01818 Encounter for other preprocedural examination: Secondary | ICD-10-CM

## 2019-06-18 ENCOUNTER — Encounter (INDEPENDENT_AMBULATORY_CARE_PROVIDER_SITE_OTHER): Payer: BC Managed Care – PPO | Admitting: Ophthalmology

## 2019-06-22 ENCOUNTER — Other Ambulatory Visit: Payer: Self-pay

## 2019-06-22 ENCOUNTER — Ambulatory Visit (HOSPITAL_COMMUNITY): Payer: BC Managed Care – PPO | Attending: Cardiovascular Disease

## 2019-06-22 DIAGNOSIS — Z01810 Encounter for preprocedural cardiovascular examination: Secondary | ICD-10-CM | POA: Diagnosis present

## 2019-06-24 ENCOUNTER — Other Ambulatory Visit (HOSPITAL_COMMUNITY): Payer: BC Managed Care – PPO

## 2019-06-25 ENCOUNTER — Encounter: Payer: Self-pay | Admitting: *Deleted

## 2019-06-29 ENCOUNTER — Other Ambulatory Visit: Payer: Self-pay

## 2019-06-29 ENCOUNTER — Ambulatory Visit (INDEPENDENT_AMBULATORY_CARE_PROVIDER_SITE_OTHER): Payer: BC Managed Care – PPO | Admitting: Internal Medicine

## 2019-06-29 ENCOUNTER — Encounter: Payer: Self-pay | Admitting: Internal Medicine

## 2019-06-29 VITALS — BP 160/84 | HR 96 | Temp 98.4°F | Ht 69.0 in | Wt 236.0 lb

## 2019-06-29 DIAGNOSIS — R112 Nausea with vomiting, unspecified: Secondary | ICD-10-CM | POA: Diagnosis not present

## 2019-06-29 DIAGNOSIS — K3184 Gastroparesis: Secondary | ICD-10-CM

## 2019-06-29 DIAGNOSIS — N186 End stage renal disease: Secondary | ICD-10-CM

## 2019-06-29 DIAGNOSIS — K5909 Other constipation: Secondary | ICD-10-CM | POA: Diagnosis not present

## 2019-06-29 DIAGNOSIS — Z992 Dependence on renal dialysis: Secondary | ICD-10-CM

## 2019-06-29 MED ORDER — ONDANSETRON 4 MG PO TBDP: 4.0000 mg | ORAL_TABLET | Freq: Three times a day (TID) | ORAL | 2 refills | Status: DC | PRN

## 2019-06-29 MED ORDER — METOCLOPRAMIDE HCL 5 MG PO TABS: 5.0000 mg | ORAL_TABLET | Freq: Three times a day (TID) | ORAL | 2 refills | Status: DC

## 2019-06-29 NOTE — Patient Instructions (Addendum)
We have sent the following medications to your pharmacy for you to pick up at your convenience: Zofran ODT 4 mg 1-2 tablets every 8 hours as needed. Metoclopramide 5 mg three times daily before meals and at bedtime as needed.  You have been scheduled to see Dr Hilarie Fredrickson for follow up on Tuesday, 08/31/19 at 9:10 am  Please follow a gastroparesis diet.  If you are age 61 or older, your body mass index should be between 23-30. Your Body mass index is 34.85 kg/m. If this is out of the aforementioned range listed, please consider follow up with your Primary Care Provider.  If you are age 79 or younger, your body mass index should be between 19-25. Your Body mass index is 34.85 kg/m. If this is out of the aformentioned range listed, please consider follow up with your Primary Care Provider.

## 2019-06-29 NOTE — Progress Notes (Signed)
Patient ID: [name], female   DOB: 1961-04-14, 59 y.o.   MRN: [id number] HPI: [name] is a 59 year old female with a past medical history of end-stage renal disease on hemodialysis Monday, Wednesday and Friday, history of type 2 diabetes, GERD, hypertension, hyperlipidemia who is seen to evaluate intermittent nausea and vomiting.  She is here today with her daughter.  We have seen her for this issue in the past most recently in July 2017.  After this we performed an upper endoscopy which was normal.  Gastric biopsies showed mild chronic inactive gastritis at that time without H. Pylori.  She also had a normal gastric emptying study in July 2017  She has continued to have episodic nausea and vomiting and was hospitalized for the same in December 2020.  Prior to this she had had a CT scan of the abdomen pelvis as well as an ultrasound of the right upper quadrant.  See below for results.  She reports that she has intermittent issues with nausea and vomiting.  This is not a daily thing but occurs on average maybe 1 to 2 days/week.  It is worse with eating, particularly larger or more fatty foods.  Her appetite overall is not good.  On good days when she is not feeling nauseated she often is somewhat scared to eat because she is worried about developing nausea and vomiting.  At times she does feel early satiety upper abdominal pressure and bloating.  No hematemesis.  Constipation is a big problem for her and she has hard stools which are difficult to pass.  She can go as infrequently as once per week.  She reports that her nephrologist just called in a medicine for constipation which she has not yet picked up.  She is not sure which medication this is.  She had a screening colonoscopy which I performed on 05/14/2011.  This was normal to the terminal ileum with the exception of mild diverticulosis in the sigmoid.  Past Medical History:  Diagnosis Date  . Accelerated hypertension   . Aortic  atherosclerosis ([place])   . Arthritis of knee    bilateral  . Diabetes mellitus    type 2 - no meds  . Diabetic gastroparesis ([place])   . Diverticulosis   . ESRD (end stage renal disease) ([place])   . Fibroid uterus   . GERD (gastroesophageal reflux disease)   . Hyperlipidemia   . Hypertension   . HYPERTENSION, BENIGN SYSTEMIC 07/03/2006        . Hypertensive emergency 02/05/2019  . Hypokalemia   . IDA (iron deficiency anemia)   . Nausea   . Renal disorder   . Umbilical hernia   . Wears dentures     Past Surgical History:  Procedure Laterality Date  . AV FISTULA PLACEMENT Left 02/17/2019   Procedure: ARTERIOVENOUS (AV) FISTULA CREATION LEFT UPPER ARM;  Surgeon: Waynetta Sandy, MD;  Location: [facility];  Service: Vascular;  Laterality: Left;  . CATARACT EXTRACTION     right eye  . CESAREAN SECTION     x2  . CHOLECYSTECTOMY     laparoscopic  . FISTULA SUPERFICIALIZATION Left 04/06/2019   Procedure: FISTULA SUPERFICIALIZATION LEFT BRACHIOCEPHALIC;  Surgeon: Waynetta Sandy, MD;  Location: [facility];  Service: Vascular;  Laterality: Left;  Transposition left arm brachiocephalic fistula.  . IR FLUORO GUIDE CV LINE RIGHT  02/09/2019  . IR FLUORO GUIDE CV LINE RIGHT  03/05/2019  . IR US GUIDE VASC ACCESS RIGHT  02/09/2019  .  MULTIPLE TOOTH EXTRACTIONS    . REDUCTION MAMMAPLASTY Bilateral     Outpatient Medications Prior to Visit  Medication Sig Dispense Refill  . Accu-Chek Softclix Lancets lancets Use as instructed 100 each 12  . acetaminophen (TYLENOL) 500 MG tablet Take 1,000 mg by mouth every 6 (six) hours as needed for moderate pain or headache.    Marland Kitchen aspirin EC 325 MG tablet Take 325 mg by mouth daily.    . B Complex-C-Folic Acid (DIALYVITE 703) 0.8 MG TABS Take 1 tablet by mouth daily.    . Blood Glucose Monitoring Suppl (ACCU-CHEK AVIVA PLUS) w/Device KIT Use to check sugar three times a day 1 kit 0  . glucose blood (ACCU-CHEK AVIVA PLUS) test strip Use as instructed  100 each 12  . Lancets (ACCU-CHEK SOFT TOUCH) lancets Use to check sugars three times a day 100 each 12  . Vitamin D, Ergocalciferol, (DRISDOL) 1.25 MG (50000 UT) CAPS capsule Take 50,000 Units by mouth every Monday.     Marland Kitchen atorvastatin (LIPITOR) 20 MG tablet TAKE 1 TABLET BY MOUTH ONCE DAILY AT  6PM (Patient taking differently: Take 20 mg by mouth daily at 6 PM. ) 90 tablet 2  . carvedilol (COREG) 25 MG tablet Take 1 tablet (25 mg total) by mouth 2 (two) times daily with a meal. 180 tablet 1  . cloNIDine (CATAPRES - DOSED IN MG/24 HR) 0.1 mg/24hr patch Take by mouth.    . polyethylene glycol (MIRALAX / GLYCOLAX) packet Take 17 g by mouth daily as needed for moderate constipation. 30 each 0  . promethazine (PHENERGAN) 25 MG suppository Place 25 mg rectally every 6 (six) hours as needed for nausea/vomiting.     No facility-administered medications prior to visit.    Allergies  Allergen Reactions  . Lisinopril Anaphylaxis and Other (See Comments)    angioedema  . Venofer  [Ferric Oxide]     Other reaction(s): Back Pain  . Camellia Swelling and Other (See Comments)    Angioedema   . Jardiance [Empagliflozin] Swelling and Rash    Family History  Problem Relation Age of Onset  . Hypertension Mother   . Diabetes Mother   . Cancer Father        lung  . Colon cancer Neg Hx   . Stomach cancer Neg Hx   . Esophageal cancer Neg Hx     Social History   Tobacco Use  . Smoking status: Former Smoker    Years: 10.00    Types: Cigarettes    Quit date: 10/16/2011    Years since quitting: 7.7  . Smokeless tobacco: Never Used  . Tobacco comment: 1 pack will last a week  Substance Use Topics  . Alcohol use: No    Alcohol/week: 0.0 standard drinks  . Drug use: No    ROS: As per history of present illness, otherwise negative  BP (!) 160/84   Pulse 96   Temp 98.4 F (36.9 C)   Ht _0  (1.753 m)   Wt 236 lb (107 kg)   LMP  (LMP Unknown)   BMI 34.85 kg/m  Constitutional:  Well-developed and well-nourished. No distress. HEENT: Normocephalic and atraumatic.   No scleral icterus. Neck: Neck supple. Trachea midline. Cardiovascular: Normal rate, regular rhythm and intact distal pulses.  Pulmonary/chest: Effort normal and breath sounds normal. No wheezing, rales or rhonchi. Abdominal: Soft, obese, nontender, nondistended. Bowel sounds active throughout. There are no masses palpable.  Extremities: no clubbing, cyanosis, or edema Neurological: Alert and  oriented to person place and time. Skin: Skin is warm and dry.  Psychiatric: Normal mood and affect. Behavior is normal.  RELEVANT LABS AND IMAGING: CBC    Component Value Date/Time   WBC 9.2 04/22/2019 0321   RBC 4.85 04/22/2019 0321   HGB 12.6 04/22/2019 0321   HGB 11.4 11/12/2018 0949   HCT 40.6 04/22/2019 0321   HCT 34.6 11/12/2018 0949   PLT 343 04/22/2019 0321   PLT 404 11/12/2018 0949   MCV 83.7 04/22/2019 0321   MCV 78 (L) 11/12/2018 0949   MCH 26.0 04/22/2019 0321   MCHC 31.0 04/22/2019 0321   RDW 14.5 04/22/2019 0321   RDW 15.0 11/12/2018 0949   LYMPHSABS 1.6 04/02/2019 1223   MONOABS 0.7 04/02/2019 1223   EOSABS 0.0 04/02/2019 1223   BASOSABS 0.0 04/02/2019 1223    CMP     Component Value Date/Time   NA 135 04/22/2019 0812   NA 145 (H) 11/12/2018 0949   K 4.3 04/22/2019 0812   CL 93 (L) 04/22/2019 0812   CO2 22 04/22/2019 0812   GLUCOSE 97 04/22/2019 0812   BUN 52 (H) 04/22/2019 0812   BUN 34 (H) 11/12/2018 0949   CREATININE 11.12 (H) 04/22/2019 0812   CREATININE 1.19 (H) 09/28/2015 1524   CALCIUM 9.3 04/22/2019 0812   CALCIUM 8.8 02/12/2019 0318   PROT 8.5 (H) 04/20/2019 0924   PROT 7.1 08/06/2017 1450   ALBUMIN 3.4 (L) 04/22/2019 0812   ALBUMIN 3.7 08/06/2017 1450   AST 21 04/20/2019 0924   ALT 11 04/20/2019 0924   ALKPHOS 103 04/20/2019 0924   BILITOT 0.2 (L) 04/20/2019 0924   BILITOT 0.2 08/06/2017 1450   GFRNONAA 3 (L) 04/22/2019 0812   GFRNONAA 51 (L) 09/28/2015 1524    GFRAA 4 (L) 04/22/2019 0812   GFRAA 59 (L) 09/28/2015 1524   ULTRASOUND ABDOMEN LIMITED RIGHT UPPER QUADRANT   COMPARISON:  CT abdomen and pelvis 04/02/2019. Renal ultrasound 02/08/2019.   FINDINGS: Gallbladder:   Surgically absent.   Common bile duct:   Diameter: 4 mm   Liver:   No focal lesion identified. Within normal limits in parenchymal echogenicity. Portal vein is patent on color Doppler imaging with normal direction of blood flow towards the liver.   Other: Increased right renal parenchymal echogenicity, also present on the prior renal ultrasound.   IMPRESSION: 1. Status post cholecystectomy.  No biliary dilatation. 2. Unremarkable appearance of the liver. 3. Echogenic right kidney compatible with medical renal disease.     Electronically Signed   By: Logan Bores M.D.   On: 04/03/2019 06:48   CT ABDOMEN AND PELVIS WITHOUT CONTRAST   TECHNIQUE: Multidetector CT imaging of the abdomen and pelvis was performed following the standard protocol without IV contrast.   COMPARISON:  CT the abdomen and pelvis 02/05/2019.   FINDINGS: Lower chest: Unremarkable.   Hepatobiliary: No definite suspicious cystic or solid hepatic lesions are confidently identified on today's noncontrast CT examination. Status post cholecystectomy.   Pancreas: No definite pancreatic mass or peripancreatic fluid collections or inflammatory changes noted on today's noncontrast CT examination.   Spleen: Unremarkable.   Adrenals/Urinary Tract: No calcifications are identified within the collecting system of either kidneys, along the course of either ureter, or within the lumen of the urinary bladder. No hydroureteronephrosis. Low-attenuation lesions in the right kidney measuring up to 1.8 cm in the interpolar region, incompletely characterized on today's non-contrast CT examination, but similar to the prior study and statistically likely to represent  cysts. Previously noted gas  and high attenuation in the interpolar region of the left kidney has completely resolved (related to remote biopsy). Small amount of bilateral perinephric soft tissue stranding (nonspecific). Urinary bladder is normal in appearance. Bilateral adrenal glands are normal in appearance.   Stomach/Bowel: Normal appearance of the stomach. No pathologic dilatation of small bowel or colon. Numerous colonic diverticulae are noted, without surrounding inflammatory changes to suggest an acute diverticulitis at this time. Normal appendix.   Vascular/Lymphatic: Aortic atherosclerosis. No lymphadenopathy noted in the abdomen or pelvis.   Reproductive: Uterus is enlarged and heterogeneous in appearance with multiple small lesions, some of which are coarsely calcified, presumably multifocal fibroids. Ovaries are unremarkable in appearance.   Other: Small umbilical and supraumbilical ventral hernias containing only omental fat. No significant volume of ascites. No pneumoperitoneum.   Musculoskeletal: There are no aggressive appearing lytic or blastic lesions noted in the visualized portions of the skeleton.   IMPRESSION: 1. No acute findings are noted in the abdomen or pelvis to account for the patient's symptoms. 2. Small umbilical and supraumbilical ventral hernias containing only omental fat. No associated bowel incarceration or obstruction at this time. 3. Normal appendix. 4. Aortic atherosclerosis. 5. Fibroid uterus. 6. Additional incidental findings, as above.     Electronically Signed   By: Vinnie Langton M.D.   On: 04/02/2019 13:42    ASSESSMENT/PLAN:  59 year old female with a past medical history of end-stage renal disease on hemodialysis Monday, Wednesday and Friday, history of type 2 diabetes, GERD, hypertension, hyperlipidemia who is seen to evaluate intermittent nausea and vomiting.   1.  Intermittent nausea with vomiting --prior evaluation endoscopically in 2017 along  with gastric emptying scan unremarkable.  Recent cross-sectional imaging with CT scan also unremarkable.  Despite her normal gastric emptying scan in 2017 I am most suspicious for intermittent gastroparesis.  We discussed this at length today.  Constipation may also be worsening symptoms --We discussed and went over the gastroparesis diet, copy given today --Trial of metoclopramide 5 mg 3 times daily before meals and at bedtime on an as-needed basis.  We discussed the rare long-term risk of tardive dyskinesia and advised that she use the lowest dose on an intermittent basis to help avoid this risk.  However, if symptoms are worse she may need 10 mg on occasion before meals and at bedtime. --We will provide Zofran ODT 4 mg which she can use 1-2 every 8 hours as needed for nausea  2.  Chronic constipation --therapy recently prescribed by nephrology.  I asked that she call me when she picks this medication up to let me know what laxative was prescribed.  Depending on response she may be a candidate for Amitiza or Linzess  3.  CRC screening --normal colonoscopy in 2013, repeat would be recommended in January 23 for screening      TF:[facility], [address], [address] [place],  [place] [address]

## 2019-06-29 NOTE — Progress Notes (Deleted)
    SUBJECTIVE:   CHIEF COMPLAINT / HPI:   Nausea/vomiting?  HM Diabetic foot exam pap  PERTINENT  PMH / PSH: ***  OBJECTIVE:   LMP  (LMP Unknown)   ***  ASSESSMENT/PLAN:   No problem-specific Assessment & Plan notes found for this encounter.     Matilde Haymaker, MD [place]

## 2019-06-30 ENCOUNTER — Ambulatory Visit: Payer: BC Managed Care – PPO | Admitting: Family Medicine

## 2019-07-01 ENCOUNTER — Other Ambulatory Visit: Payer: Self-pay

## 2019-07-01 ENCOUNTER — Encounter (INDEPENDENT_AMBULATORY_CARE_PROVIDER_SITE_OTHER): Payer: BC Managed Care – PPO | Admitting: Ophthalmology

## 2019-07-01 DIAGNOSIS — I10 Essential (primary) hypertension: Secondary | ICD-10-CM | POA: Diagnosis not present

## 2019-07-01 DIAGNOSIS — H35033 Hypertensive retinopathy, bilateral: Secondary | ICD-10-CM

## 2019-07-01 DIAGNOSIS — H43813 Vitreous degeneration, bilateral: Secondary | ICD-10-CM

## 2019-07-01 DIAGNOSIS — E113311 Type 2 diabetes mellitus with moderate nonproliferative diabetic retinopathy with macular edema, right eye: Secondary | ICD-10-CM

## 2019-07-01 DIAGNOSIS — E11311 Type 2 diabetes mellitus with unspecified diabetic retinopathy with macular edema: Secondary | ICD-10-CM

## 2019-07-01 DIAGNOSIS — E113392 Type 2 diabetes mellitus with moderate nonproliferative diabetic retinopathy without macular edema, left eye: Secondary | ICD-10-CM | POA: Diagnosis not present

## 2019-07-01 DIAGNOSIS — H2512 Age-related nuclear cataract, left eye: Secondary | ICD-10-CM

## 2019-07-02 ENCOUNTER — Other Ambulatory Visit (HOSPITAL_COMMUNITY)
Admission: RE | Admit: 2019-07-02 | Discharge: 2019-07-02 | Disposition: A | Discharge status: Home / Self Care | Payer: BC Managed Care – PPO | Source: Ambulatory Visit | Attending: Nephrology | Admitting: Nephrology

## 2019-07-02 DIAGNOSIS — Z20822 Contact with and (suspected) exposure to covid-19: Secondary | ICD-10-CM | POA: Insufficient documentation

## 2019-07-02 DIAGNOSIS — Z01812 Encounter for preprocedural laboratory examination: Secondary | ICD-10-CM | POA: Diagnosis not present

## 2019-07-02 LAB — SARS CORONAVIRUS 2 (TAT 6-24 HRS): SARS Coronavirus 2: NEGATIVE

## 2019-07-05 ENCOUNTER — Telehealth (HOSPITAL_COMMUNITY): Payer: Self-pay

## 2019-07-05 NOTE — Telephone Encounter (Signed)
Detailed instructions left on the patient's answering machine per DPR. Asked to call back with any questions. S.Wiliams EMTP

## 2019-07-06 ENCOUNTER — Ambulatory Visit (HOSPITAL_COMMUNITY): Payer: BC Managed Care – PPO | Attending: Internal Medicine

## 2019-07-06 ENCOUNTER — Other Ambulatory Visit: Payer: Self-pay

## 2019-07-06 ENCOUNTER — Ambulatory Visit (HOSPITAL_COMMUNITY): Payer: BC Managed Care – PPO

## 2019-07-06 DIAGNOSIS — Z01818 Encounter for other preprocedural examination: Secondary | ICD-10-CM | POA: Insufficient documentation

## 2019-07-06 DIAGNOSIS — Z01810 Encounter for preprocedural cardiovascular examination: Secondary | ICD-10-CM

## 2019-08-02 ENCOUNTER — Encounter: Payer: Self-pay | Admitting: Nephrology

## 2019-08-27 ENCOUNTER — Ambulatory Visit: Payer: BC Managed Care – PPO | Admitting: Internal Medicine

## 2019-08-31 ENCOUNTER — Ambulatory Visit: Payer: BC Managed Care – PPO | Admitting: Internal Medicine

## 2019-09-18 ENCOUNTER — Encounter (HOSPITAL_COMMUNITY): Payer: Self-pay | Admitting: Radiology

## 2019-09-18 ENCOUNTER — Other Ambulatory Visit: Payer: Self-pay

## 2019-09-18 ENCOUNTER — Emergency Department (HOSPITAL_COMMUNITY): Payer: BC Managed Care – PPO

## 2019-09-18 ENCOUNTER — Inpatient Hospital Stay (HOSPITAL_COMMUNITY)
Admission: EM | Admit: 2019-09-18 | Discharge: 2019-09-23 | DRG: 064 | Disposition: A | Discharge status: Rehabilitation Facility | Payer: BC Managed Care – PPO | Attending: Family Medicine | Admitting: Family Medicine

## 2019-09-18 DIAGNOSIS — D631 Anemia in chronic kidney disease: Secondary | ICD-10-CM | POA: Diagnosis present

## 2019-09-18 DIAGNOSIS — E1122 Type 2 diabetes mellitus with diabetic chronic kidney disease: Secondary | ICD-10-CM | POA: Diagnosis present

## 2019-09-18 DIAGNOSIS — R29810 Facial weakness: Secondary | ICD-10-CM | POA: Diagnosis present

## 2019-09-18 DIAGNOSIS — Z8249 Family history of ischemic heart disease and other diseases of the circulatory system: Secondary | ICD-10-CM

## 2019-09-18 DIAGNOSIS — I639 Cerebral infarction, unspecified: Secondary | ICD-10-CM | POA: Diagnosis present

## 2019-09-18 DIAGNOSIS — E785 Hyperlipidemia, unspecified: Secondary | ICD-10-CM | POA: Diagnosis present

## 2019-09-18 DIAGNOSIS — Z6836 Body mass index (BMI) 36.0-36.9, adult: Secondary | ICD-10-CM

## 2019-09-18 DIAGNOSIS — R531 Weakness: Secondary | ICD-10-CM

## 2019-09-18 DIAGNOSIS — K5900 Constipation, unspecified: Secondary | ICD-10-CM | POA: Diagnosis present

## 2019-09-18 DIAGNOSIS — E1143 Type 2 diabetes mellitus with diabetic autonomic (poly)neuropathy: Secondary | ICD-10-CM | POA: Diagnosis present

## 2019-09-18 DIAGNOSIS — I120 Hypertensive chronic kidney disease with stage 5 chronic kidney disease or end stage renal disease: Secondary | ICD-10-CM | POA: Diagnosis present

## 2019-09-18 DIAGNOSIS — I700 Atherosclerosis of aorta: Secondary | ICD-10-CM | POA: Diagnosis present

## 2019-09-18 DIAGNOSIS — H5712 Ocular pain, left eye: Secondary | ICD-10-CM | POA: Diagnosis not present

## 2019-09-18 DIAGNOSIS — G4733 Obstructive sleep apnea (adult) (pediatric): Secondary | ICD-10-CM | POA: Diagnosis present

## 2019-09-18 DIAGNOSIS — Z992 Dependence on renal dialysis: Secondary | ICD-10-CM

## 2019-09-18 DIAGNOSIS — R29705 NIHSS score 5: Secondary | ICD-10-CM | POA: Diagnosis present

## 2019-09-18 DIAGNOSIS — N186 End stage renal disease: Secondary | ICD-10-CM | POA: Diagnosis present

## 2019-09-18 DIAGNOSIS — K219 Gastro-esophageal reflux disease without esophagitis: Secondary | ICD-10-CM

## 2019-09-18 DIAGNOSIS — Z20822 Contact with and (suspected) exposure to covid-19: Secondary | ICD-10-CM | POA: Diagnosis present

## 2019-09-18 DIAGNOSIS — Z7982 Long term (current) use of aspirin: Secondary | ICD-10-CM

## 2019-09-18 DIAGNOSIS — I63342 Cerebral infarction due to thrombosis of left cerebellar artery: Secondary | ICD-10-CM

## 2019-09-18 DIAGNOSIS — E8889 Other specified metabolic disorders: Secondary | ICD-10-CM | POA: Diagnosis present

## 2019-09-18 DIAGNOSIS — Z8673 Personal history of transient ischemic attack (TIA), and cerebral infarction without residual deficits: Secondary | ICD-10-CM

## 2019-09-18 DIAGNOSIS — Z87891 Personal history of nicotine dependence: Secondary | ICD-10-CM

## 2019-09-18 DIAGNOSIS — D72829 Elevated white blood cell count, unspecified: Secondary | ICD-10-CM | POA: Diagnosis not present

## 2019-09-18 DIAGNOSIS — Z9049 Acquired absence of other specified parts of digestive tract: Secondary | ICD-10-CM

## 2019-09-18 DIAGNOSIS — G8194 Hemiplegia, unspecified affecting left nondominant side: Secondary | ICD-10-CM | POA: Diagnosis present

## 2019-09-18 DIAGNOSIS — N2581 Secondary hyperparathyroidism of renal origin: Secondary | ICD-10-CM | POA: Diagnosis present

## 2019-09-18 DIAGNOSIS — Z79899 Other long term (current) drug therapy: Secondary | ICD-10-CM

## 2019-09-18 DIAGNOSIS — R471 Dysarthria and anarthria: Secondary | ICD-10-CM | POA: Diagnosis present

## 2019-09-18 DIAGNOSIS — I872 Venous insufficiency (chronic) (peripheral): Secondary | ICD-10-CM | POA: Diagnosis present

## 2019-09-18 DIAGNOSIS — I249 Acute ischemic heart disease, unspecified: Secondary | ICD-10-CM

## 2019-09-18 DIAGNOSIS — I6381 Other cerebral infarction due to occlusion or stenosis of small artery: Principal | ICD-10-CM | POA: Diagnosis present

## 2019-09-18 DIAGNOSIS — E871 Hypo-osmolality and hyponatremia: Secondary | ICD-10-CM | POA: Diagnosis present

## 2019-09-18 DIAGNOSIS — E669 Obesity, unspecified: Secondary | ICD-10-CM | POA: Diagnosis present

## 2019-09-18 DIAGNOSIS — I722 Aneurysm of renal artery: Secondary | ICD-10-CM | POA: Diagnosis present

## 2019-09-18 DIAGNOSIS — Z833 Family history of diabetes mellitus: Secondary | ICD-10-CM

## 2019-09-18 DIAGNOSIS — K3184 Gastroparesis: Secondary | ICD-10-CM | POA: Diagnosis present

## 2019-09-18 DIAGNOSIS — R000 Tachycardia, unspecified: Secondary | ICD-10-CM | POA: Diagnosis not present

## 2019-09-18 DIAGNOSIS — Z888 Allergy status to other drugs, medicaments and biological substances status: Secondary | ICD-10-CM

## 2019-09-18 DIAGNOSIS — E1151 Type 2 diabetes mellitus with diabetic peripheral angiopathy without gangrene: Secondary | ICD-10-CM | POA: Diagnosis present

## 2019-09-18 DIAGNOSIS — D509 Iron deficiency anemia, unspecified: Secondary | ICD-10-CM | POA: Diagnosis present

## 2019-09-18 LAB — COMPREHENSIVE METABOLIC PANEL
ALT: 9 U/L (ref 0–44)
AST: 10 U/L — ABNORMAL LOW (ref 15–41)
Albumin: 3.5 g/dL (ref 3.5–5.0)
Alkaline Phosphatase: 78 U/L (ref 38–126)
Anion gap: 18 — ABNORMAL HIGH (ref 5–15)
BUN: 29 mg/dL — ABNORMAL HIGH (ref 6–20)
CO2: 24 mmol/L (ref 22–32)
Calcium: 9.9 mg/dL (ref 8.9–10.3)
Chloride: 93 mmol/L — ABNORMAL LOW (ref 98–111)
Creatinine, Ser: 7.97 mg/dL — ABNORMAL HIGH (ref 0.44–1.00)
GFR calc Af Amer: 6 mL/min — ABNORMAL LOW (ref >60)
GFR calc non Af Amer: 5 mL/min — ABNORMAL LOW (ref >60)
Glucose, Bld: 173 mg/dL — ABNORMAL HIGH (ref 70–99)
Potassium: 4.2 mmol/L (ref 3.5–5.1)
Sodium: 135 mmol/L (ref 135–145)
Total Bilirubin: 0.7 mg/dL (ref 0.3–1.2)
Total Protein: 7.4 g/dL (ref 6.5–8.1)

## 2019-09-18 LAB — DIFFERENTIAL
Abs Immature Granulocytes: 0.05 10*3/uL (ref 0.00–0.07)
Basophils Absolute: 0 10*3/uL (ref 0.0–0.1)
Basophils Relative: 0 %
Eosinophils Absolute: 0 10*3/uL (ref 0.0–0.5)
Eosinophils Relative: 0 %
Immature Granulocytes: 1 %
Lymphocytes Relative: 16 %
Lymphs Abs: 1.6 10*3/uL (ref 0.7–4.0)
Monocytes Absolute: 0.5 10*3/uL (ref 0.1–1.0)
Monocytes Relative: 5 %
Neutro Abs: 7.4 10*3/uL (ref 1.7–7.7)
Neutrophils Relative %: 78 %

## 2019-09-18 LAB — APTT: aPTT: 31 seconds (ref 24–36)

## 2019-09-18 LAB — I-STAT CHEM 8, ED
BUN: 31 mg/dL — ABNORMAL HIGH (ref 6–20)
Calcium, Ion: 1.03 mmol/L — ABNORMAL LOW (ref 1.15–1.40)
Chloride: 98 mmol/L (ref 98–111)
Creatinine, Ser: 9.2 mg/dL — ABNORMAL HIGH (ref 0.44–1.00)
Glucose, Bld: 170 mg/dL — ABNORMAL HIGH (ref 70–99)
HCT: 31 % — ABNORMAL LOW (ref 36.0–46.0)
Hemoglobin: 10.5 g/dL — ABNORMAL LOW (ref 12.0–15.0)
Potassium: 4.3 mmol/L (ref 3.5–5.1)
Sodium: 134 mmol/L — ABNORMAL LOW (ref 135–145)
TCO2: 31 mmol/L (ref 22–32)

## 2019-09-18 LAB — I-STAT BETA HCG BLOOD, ED (MC, WL, AP ONLY): I-stat hCG, quantitative: 9.4 m[IU]/mL — ABNORMAL HIGH (ref <5)

## 2019-09-18 LAB — SARS CORONAVIRUS 2 BY RT PCR (HOSPITAL ORDER, PERFORMED IN ~~LOC~~ HOSPITAL LAB): SARS Coronavirus 2: NEGATIVE

## 2019-09-18 LAB — CBC
HCT: 33.1 % — ABNORMAL LOW (ref 36.0–46.0)
Hemoglobin: 9.5 g/dL — ABNORMAL LOW (ref 12.0–15.0)
MCH: 22.8 pg — ABNORMAL LOW (ref 26.0–34.0)
MCHC: 28.7 g/dL — ABNORMAL LOW (ref 30.0–36.0)
MCV: 79.6 fL — ABNORMAL LOW (ref 80.0–100.0)
Platelets: 468 10*3/uL — ABNORMAL HIGH (ref 150–400)
RBC: 4.16 MIL/uL (ref 3.87–5.11)
RDW: 16 % — ABNORMAL HIGH (ref 11.5–15.5)
WBC: 9.6 10*3/uL (ref 4.0–10.5)
nRBC: 0.6 % — ABNORMAL HIGH (ref 0.0–0.2)

## 2019-09-18 LAB — PROTIME-INR
INR: 1 (ref 0.8–1.2)
Prothrombin Time: 13 seconds (ref 11.4–15.2)

## 2019-09-18 LAB — CBG MONITORING, ED: Glucose-Capillary: 159 mg/dL — ABNORMAL HIGH (ref 70–99)

## 2019-09-18 IMAGING — CT CT ANGIO HEAD
2 of 7 series · 8 of 33 positions shown · IV contrast (OMNI 350)
Comparison: Head CT same day

CLINICAL DATA: Acute presentation with left sided weakness and left
facial droop. Slurred speech.

EXAM:
CT ANGIOGRAPHY HEAD AND NECK
TECHNIQUE: Multidetector CT imaging of the head and neck was performed using
the standard protocol during bolus administration of intravenous
contrast. Multiplanar CT image reconstructions and MIPs were
obtained to evaluate the vascular anatomy. Carotid stenosis
measurements (when applicable) are obtained utilizing NASCET
criteria, using the distal internal carotid diameter as the
denominator.
CONTRAST:  Not annotated.

[Series 5: cta neck · axial · 0.49mm/px · z∈[-164,-50]mm · 2 of 173 slices shown]
[im 58/173  soft-tissue]
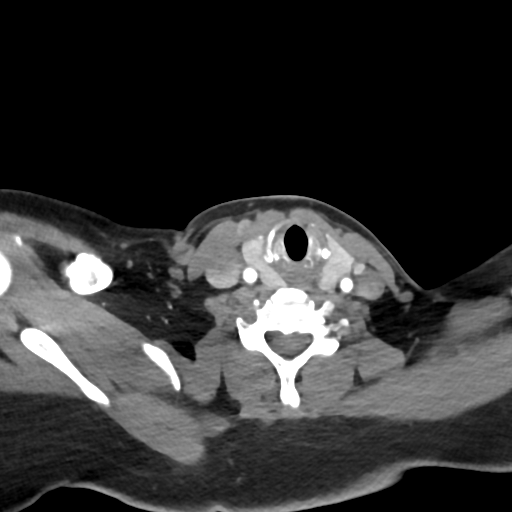
[im 115/173  soft-tissue]
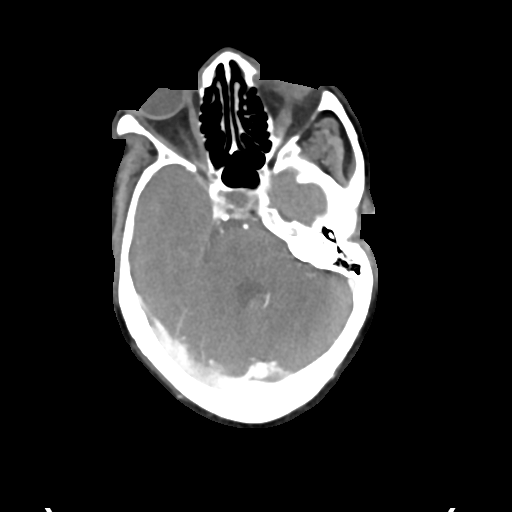

[Series 7: cta neck axial · axial · 0.54mm/px · z∈[-244,+5]mm · 6 of 352 slices shown]
[im 51/352  soft-tissue]
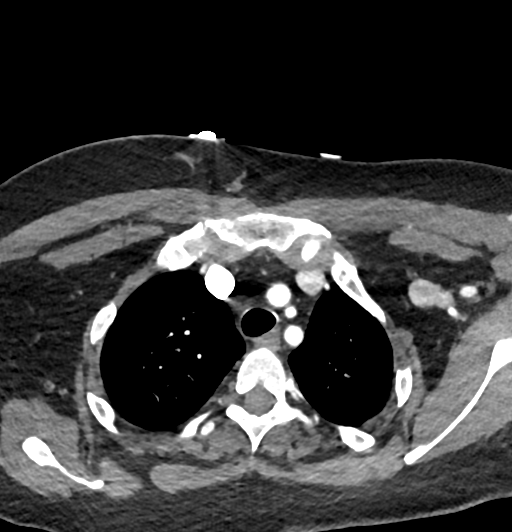
[im 101/352  bone]
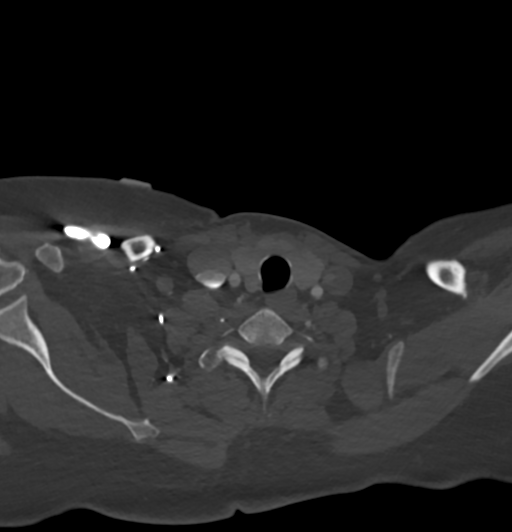
[im 151/352  soft-tissue]
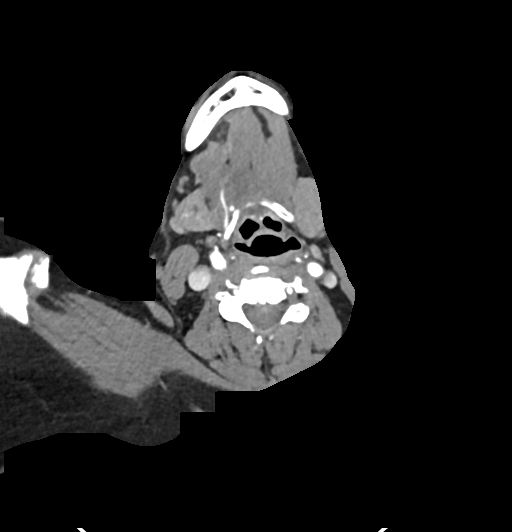
[im 201/352  bone]
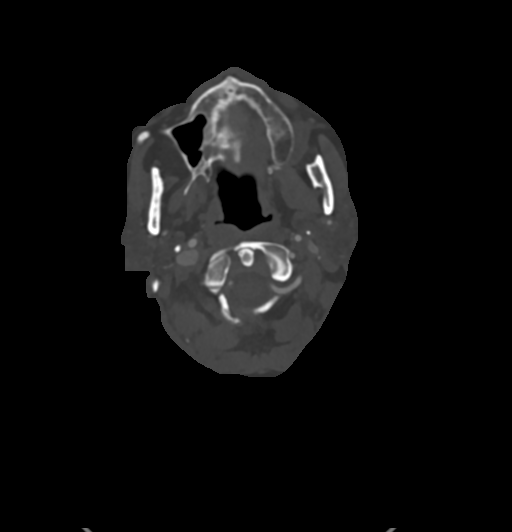
[im 251/352  soft-tissue]
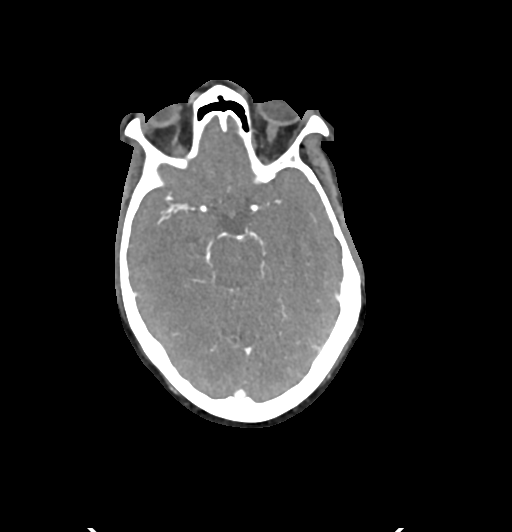
[im 301/352  bone]
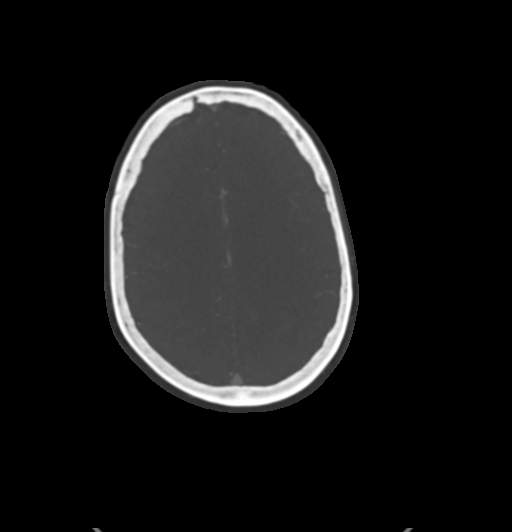

[8 of 33 positions shown; findings below may reference images not displayed]

FINDINGS: CTA NECK FINDINGS

Aortic arch: Aortic atherosclerosis. Branching pattern is normal
without origin stenosis.

Right carotid system: Common carotid artery widely patent to the
bifurcation. Minimal atherosclerotic plaque at the carotid
bifurcation and ICA bulb but no stenosis. Cervical ICA widely
patent.

Left carotid system: Common carotid artery widely patent to the
bifurcation. Mild atherosclerotic plaque at the ICA bulb. Minimal
diameter 3.5 mm. Compared to a more distal cervical ICA diameter of
4 mm, this indicates a 10-20% stenosis.

Vertebral arteries: Both vertebral arteries are patent through the
cervical region. Right vertebral artery takes an early origin from
the proximal subclavian.

Skeleton: Ordinary mid cervical spondylosis.

Other neck: No mass or lymphadenopathy.

Upper chest: Normal

Review of the MIP images confirms the above findings

CTA HEAD FINDINGS

Anterior circulation: Both internal carotid arteries are patent
through the skull base and siphon regions. There is mild
atherosclerotic calcification in the carotid siphon regions but no
stenosis greater than 30%. The anterior and middle cerebral vessels
are patent without proximal stenosis, aneurysm or vascular
malformation. No large or medium vessel occlusion.

Posterior circulation: Both vertebral arteries widely patent to the
basilar. No basilar stenosis. Posterior circulation branch vessels
are normal. Patent posterior communicating arteries.

Venous sinuses: Normal

Anatomic variants: None

Review of the MIP images confirms the above findings
IMPRESSION: No acute large or medium vessel occlusion.

Aortic Atherosclerosis ([W6]-[W6]).

Atherosclerotic change at both carotid bifurcations. No stenosis on
the right. 10-20% ICA bulb stenosis on the left.

Ordinary atherosclerotic change in the carotid siphon regions but
without stenosis greater than 30%.

## 2019-09-18 IMAGING — CT CT HEAD CODE STROKE
4 series · 16 of 47 positions shown, 18 images · non-contrast
Comparison: [DATE].

CLINICAL DATA: Code stroke. Acute stroke with left-sided weakness
and left facial droop. Slurred speech.

EXAM:
CT HEAD WITHOUT CONTRAST
TECHNIQUE: Contiguous axial images were obtained from the base of the skull
through the vertex without intravenous contrast.

[Series 3: head without · axial · non-contrast · 0.43mm/px · z∈[-55,+65]mm · 7 of 32 slices shown, 9 images]
[im 4/32  brain]
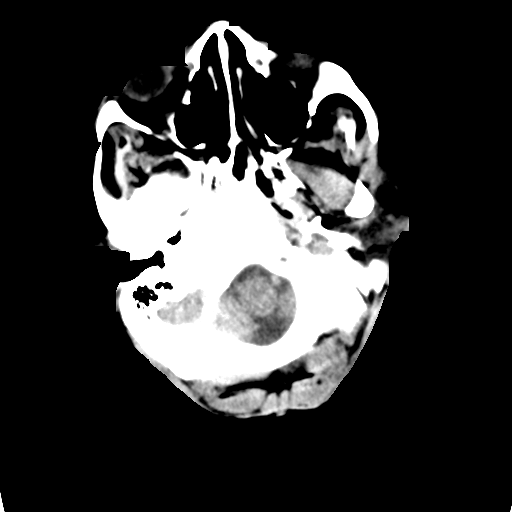
[im 4/32  bone]
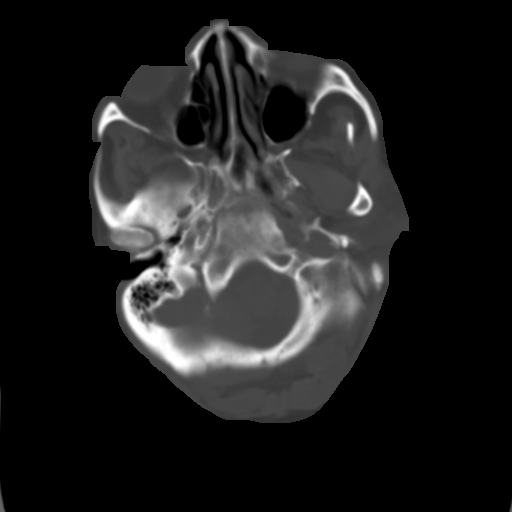
[im 8/32  brain]
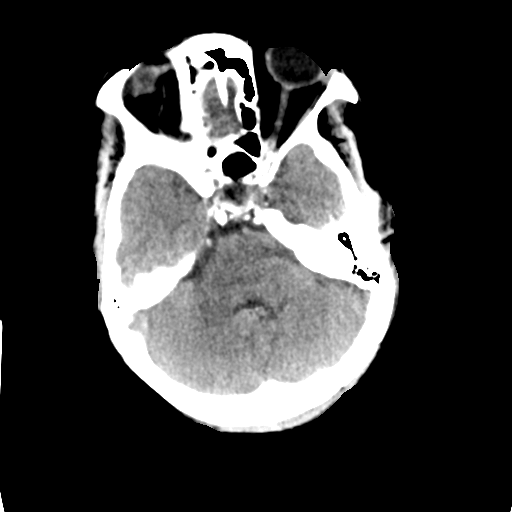
[im 12/32  brain]
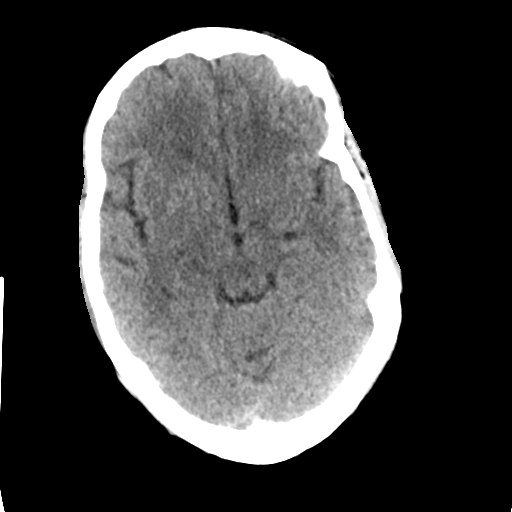
[im 16/32  brain]
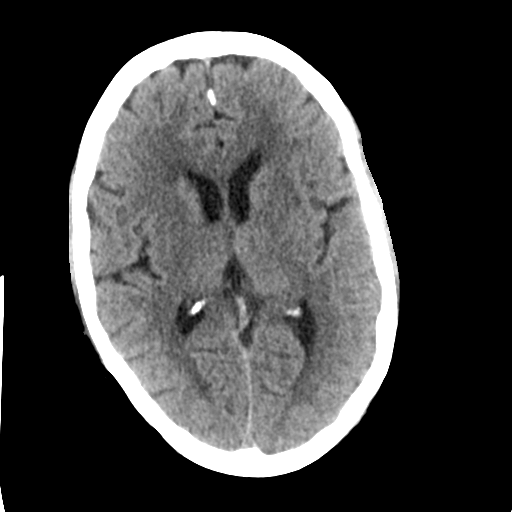
[im 20/32  brain]
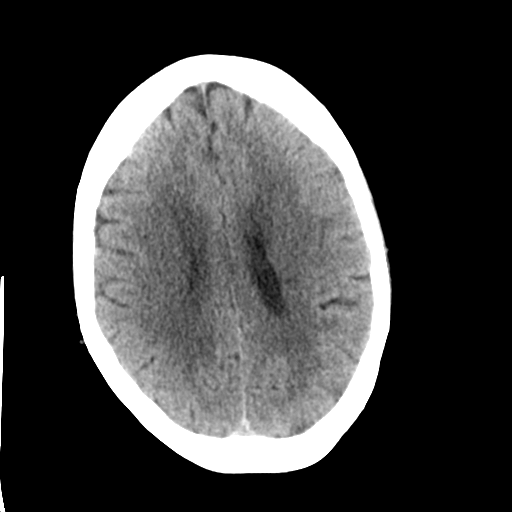
[im 20/32  bone]
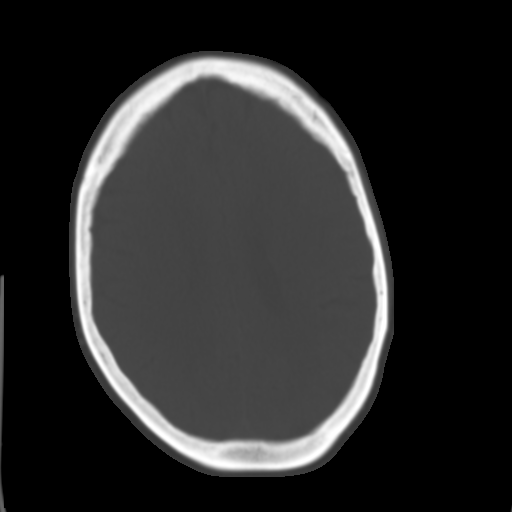
[im 24/32  brain]
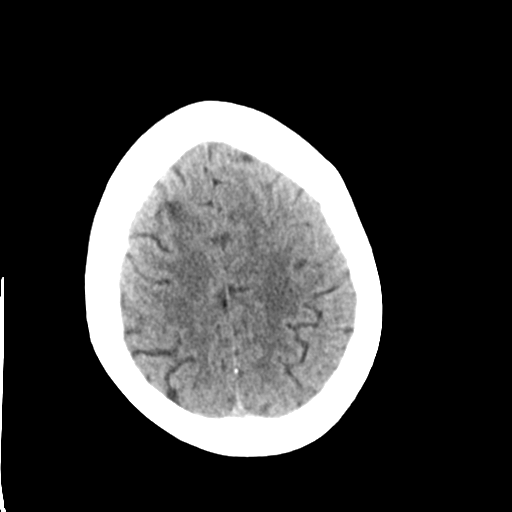
[im 28/32  brain]
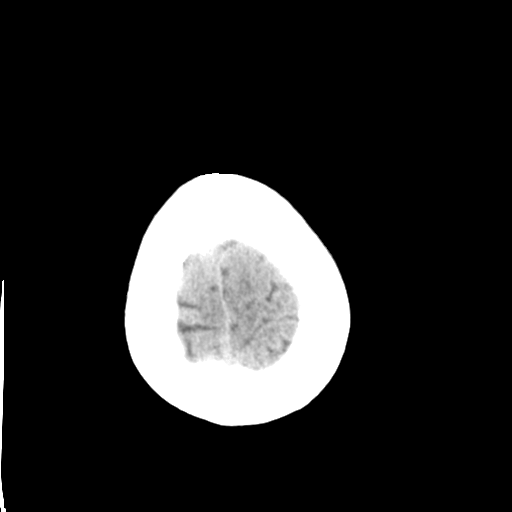

[Series 4: head bone · axial · 0.43mm/px · z∈[-56,-24]mm · 3 of 79 slices shown]
[im 8/79  bone]
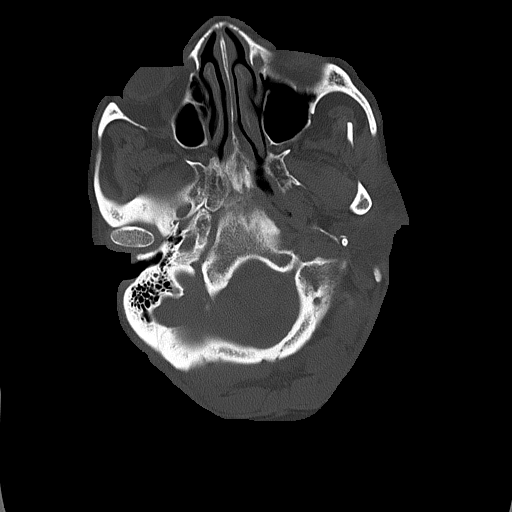
[im 16/79  bone]
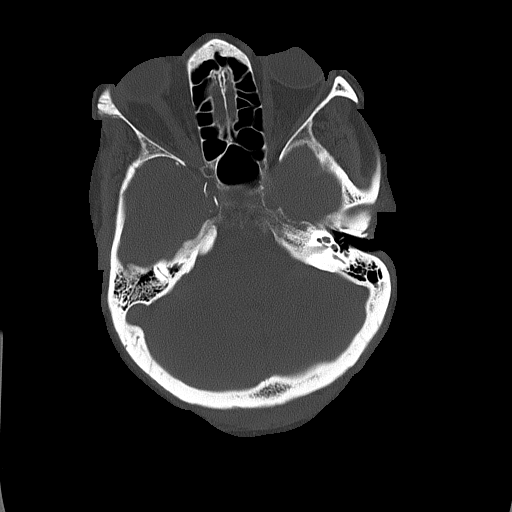
[im 24/79  bone]
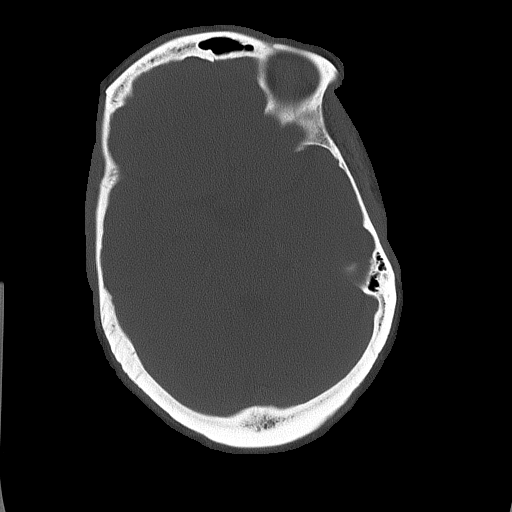

[Series 5: head without cor · coronal · non-contrast · 0.35mm/px · 3 of 67 slices shown]
[im 23/67  brain]
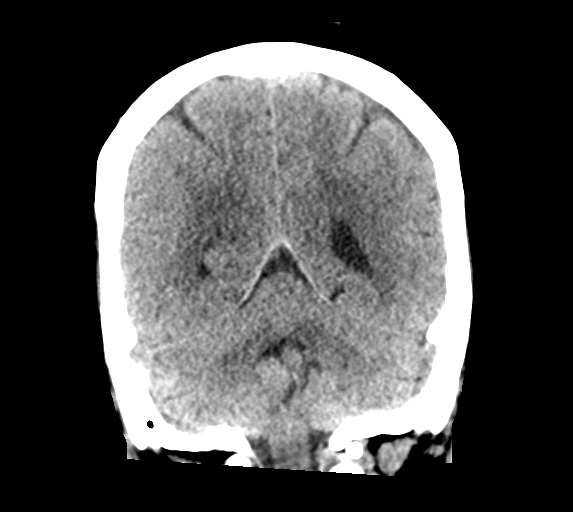
[im 30/67  brain]
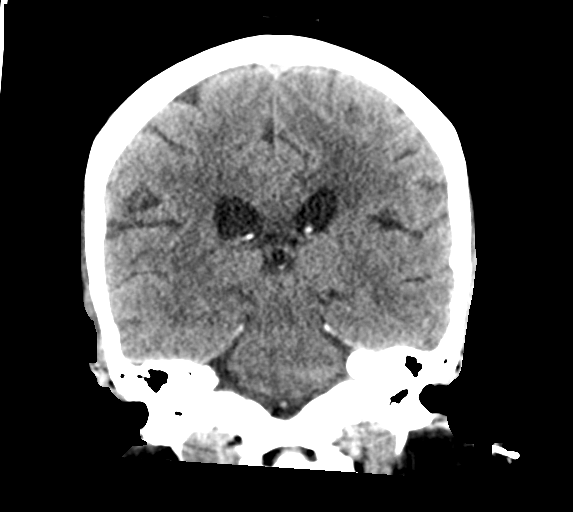
[im 37/67  brain]
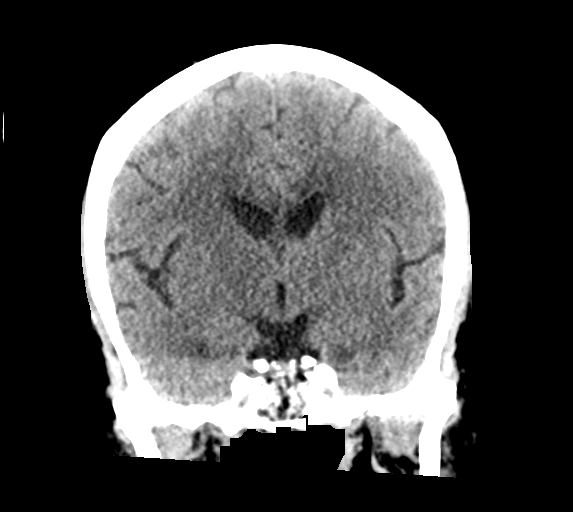

[Series 6: head without sag · sagittal · non-contrast · 0.32mm/px · 3 of 50 slices shown]
[im 17/50  brain]
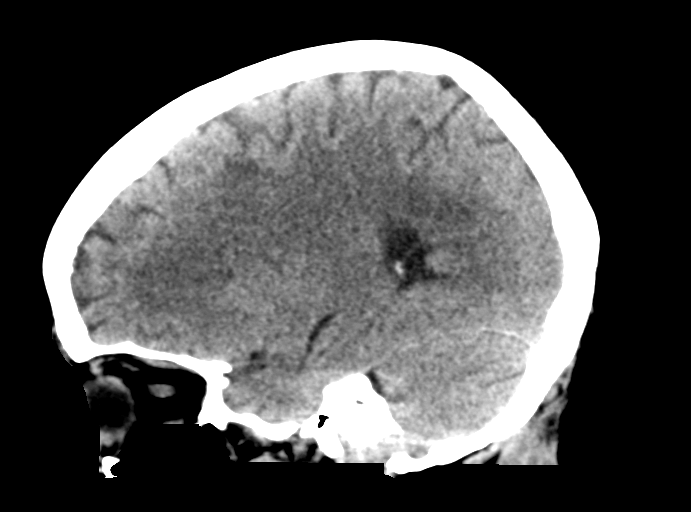
[im 25/50  brain]
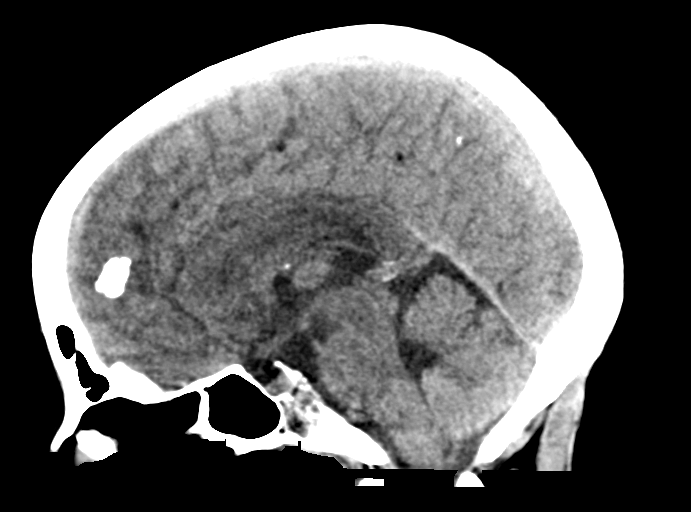
[im 33/50  brain]
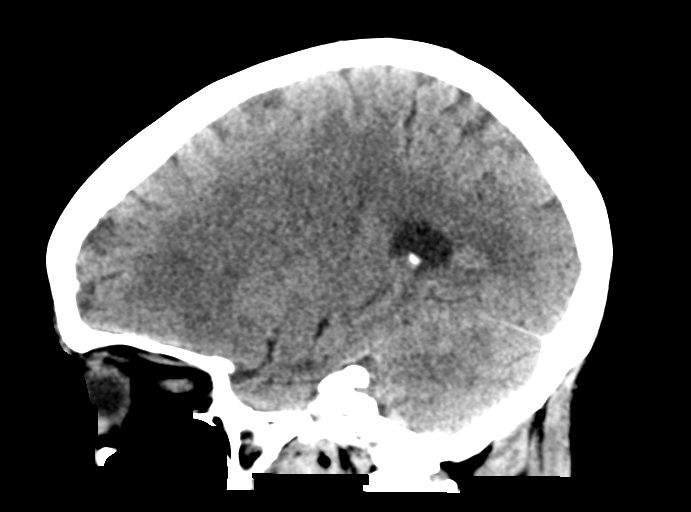

[16 of 47 positions shown; findings below may reference images not displayed]

FINDINGS: Brain: Chronic small-vessel ischemic changes of the hemispheric
white matter. Old right frontal cortical and subcortical infarction.
No sign of acute infarction, mass lesion, hemorrhage, hydrocephalus
or extra-axial collection.

Vascular: There is atherosclerotic calcification of the major
vessels at the base of the brain.

Skull: Negative

Sinuses/Orbits: Clear/normal

Other: None

ASPECTS ([place] Stroke Program Early CT Score)

- Ganglionic level infarction (caudate, lentiform nuclei, internal
capsule, insula, M1-M3 cortex): 7

- Supraganglionic infarction (M4-M6 cortex): 3

Total score (0-10 with 10 being normal): 10
IMPRESSION: 1. No acute finding. Mild chronic small-vessel change of the
hemispheric white matter. Old right frontal cortical and subcortical
infarction.
2. ASPECTS is 10.
3. These results were communicated to Dr. ETOIL at [DATE] pmon
[DATE]by text page via the [employer] messaging system.

## 2019-09-18 MED ORDER — ACETAMINOPHEN 325 MG PO TABS: 650.0000 mg | ORAL_TABLET | Freq: Four times a day (QID) | ORAL | Status: DC | PRN

## 2019-09-18 MED ORDER — SODIUM CHLORIDE 0.9% FLUSH: 3.0000 mL | Freq: Once | INTRAVENOUS | Status: DC

## 2019-09-18 MED ORDER — POLYETHYLENE GLYCOL 3350 17 G PO PACK
17.0000 g | PACK | Freq: Every day | ORAL | Status: DC | PRN
  Administered 2019-09-19 – 2019-09-21 (×2): 17 g via ORAL
  Filled 2019-09-18 (×3): qty 1

## 2019-09-18 MED ORDER — ASPIRIN EC 325 MG PO TBEC
325.0000 mg | DELAYED_RELEASE_TABLET | Freq: Every day | ORAL | Status: DC
  Administered 2019-09-18 – 2019-09-19 (×2): 325 mg via ORAL
  Filled 2019-09-18 (×2): qty 1

## 2019-09-18 MED ORDER — ASPIRIN EC 325 MG PO TBEC: 325.0000 mg | DELAYED_RELEASE_TABLET | Freq: Every day | ORAL | Status: DC

## 2019-09-18 MED ORDER — IOHEXOL 350 MG/ML SOLN
75.0000 mL | Freq: Once | INTRAVENOUS | Status: AC | PRN
  Administered 2019-09-18: 75 mL via INTRAVENOUS

## 2019-09-18 MED ORDER — HEPARIN SODIUM (PORCINE) 5000 UNIT/ML IJ SOLN
5000.0000 [IU] | Freq: Three times a day (TID) | INTRAMUSCULAR | Status: DC
  Administered 2019-09-18 – 2019-09-23 (×12): 5000 [IU] via SUBCUTANEOUS
  Filled 2019-09-18 (×12): qty 1

## 2019-09-18 MED ORDER — ACETAMINOPHEN 650 MG RE SUPP: 650.0000 mg | Freq: Four times a day (QID) | RECTAL | Status: DC | PRN

## 2019-09-18 NOTE — H&P (Addendum)
[facility] Admission History and Physical Service Pager: [phone]  Patient name: [name] Medical record number: [id number] Date of birth: 04-07-1961 Age: 59 y.o. Gender: female  Primary Care Provider: Matilde Haymaker, MD Consultants: Neurology Code Status: full Preferred Emergency Contact: [name], daughter  Chief Complaint: left-sided weakness  Assessment and Plan: [name] is a 59 y.o. female presenting with left-sided weakness and facial droop. PMH is significant for HTN, ESRD, DMT2.  Left-sided weakness 2/2 CVA Patient was in her usual state of health this morning and got in her car at 11 AM to go to her sister's house, by the time she reached her destination, she had left-sided weakness in arm, leg, facial droop, and slurred speech. By the time EMS arrived, slurred speech had resolved, but left-sided facial droop and weakness had not. CT head showed no hemorrhage, CTA showed no LVO or significant stenosis. Patient was offered tPA by neurology, but declined. She is still within the window until 1530, in case new or worsening sx of stroke appear. Patient and daughter inquired about risks vs benefits with tPA again, which were discussed and educational materials given. At this time, they still decline tPA. VSS, BP has ranged from normotensive to mildly hypertensive in the 140s. Permissive HTN in place for next 24-48 hours. BUN, cr elevated to 31 and 9.2 respectively. Patient has mild hyponatremia to 134 on labs, glucose mildly elevated to 170. On exam, patient has weakness on the LUE and LLE 2/5, very slight facial droop on the left. However she can now lift both LUE and LLE, an improvement from this morning.  -Admit to medical telemetry, FPTS, attending Dr. Nori Riis -Neurology consulted, appreciate recommendations -f/u MR brain -Echocardiogram -BP goal: permissive HTN up to 220/110 for 24-48 hours -ASA 325 -Hgb A1c, fasting lipid  panel -PT/OT/SLP -Continuous cardiac monitoring -Frequent neuro checks q4h x24h -Stroke swallow screen -NPO until passes swallow screen -Heparin for DVT ppx   ESRD Secondary hyperparathyroidism  Anemia CKD   Patient is on HD, MWF schedule. Last session yesterday, will not require another HD session until Monday. Left arm cephalic vein fistula with thrill, started using in January of this year. Patient does not appear fluid overloaded: clear lungs on auscultation, only trace LE edema. - Continue MWF schedule if patient still admitted on Monday  - consult nephrology for HD needs 5/16 AM   HTN In December 2020 patient was admitted to this service with hypertensive emergency, >220/110 with labetalol treatment. During that admission, BP was very labile. On discharge she was given Coreg 25 mg BID. All other BP medications were held as patient had returned to normotensive BP. Patient was supposed to follow up in clinic for BP checks and possibly start amlodipine, but she did not follow up. BP today ranging from normotensive to mildly hypertensive, SBP 140s. Patient is not currently taking any HTN medication, nor coreg. Permissive HTN in place for the next 24-48 hours. Will continue to monitor. -Permissive HTN for next 24-48 hours - If BP >220/110 can consider using IV labetalol   HLD Lipid panel in July 2020 showed total cholesterol 181, HDL 55, LDL 107, and triglycerides 95. Patient has previously been on statin, last prescribed atorvastatin starting in 2017. However, on home meds, patient does not list taking a statin. EMR unclear as to why patient is no longer taking statin, but lists end date for atorvastatin as 06/29/19. LDL goal >70 due to CVA. Patient not on statin at home,  possibly since limited evidence of statin benefit dialysis patients. -f/u lipid panel tomorrow AM  DMT2 Last Hgb A1c in July 2020 was 5.1%, likely falsely low in setting of ESRD. Not on any medications. Glucose today  increased to 170. Will monitor CBGs while in hospital.  -AM BMP  Gastroparesis Patient has cyclical nausea and vomiting, admitted for this in December 2020. Thought to be related to gastroparesis. Follows with Dr. Hilarie Fredrickson, [facility], last seen in February 2021. Patient had prescriptions for both reglan and zofran. She states that she is not having nausea or emesis today, will continue to monitor. Recommend patient not using each of those medications in an overlapping fashion. - Monitor for nausea/vomiting FEN/GI: NPO until passes swallow screen Prophylaxis: Heparin  Disposition: admit to medical telemetry pending stroke work up  History of Present Illness:  [name] is a 59 y.o. female presenting with left-sided weakness. At 11 AM went to go to her sister's house. By the time she arrived to the sister's house, she was having left sided weakness. Daughter believes that patient is getting too much taken off at HD as she always goes home and is weak, tired and not hungry. Pt is compliant with HD since beginning in October 2020. Dialysis last on Friday. She notes that she has not had any nausea, emesis, or diarrhea. She sometimes has constipation. Has not had BM in 2 days, states this is her baseline.  Review Of Systems: Per HPI with the following additions:   Review of Systems  Constitutional: Negative for fever.  HENT: Negative for congestion, postnasal drip, sinus pain and sore throat.   Respiratory: Negative for cough, chest tightness, shortness of breath and wheezing.   Cardiovascular: Negative for chest pain, palpitations and leg swelling.  Gastrointestinal: Negative for abdominal distention, abdominal pain, constipation, diarrhea and nausea.  Genitourinary: Negative for difficulty urinating.  Musculoskeletal: Negative for arthralgias, joint swelling and myalgias.  Skin: Negative for rash and wound.  Neurological: Positive for facial asymmetry, speech difficulty, weakness and headaches.  Negative for dizziness.  Psychiatric/Behavioral: Negative for behavioral problems, confusion and hallucinations. The patient is not hyperactive.      Patient Active Problem List   Diagnosis Date Noted  . Stroke ([place]) 09/18/2019  . Intractable nausea and vomiting 04/20/2019  . Atypical chest pain 04/02/2019  . Type 2 diabetes mellitus without complications ([place]) 07/25/2246  . Anemia in chronic kidney disease 02/19/2019  . ESRD (end stage renal disease) ([place]) 02/19/2019  . Secondary hyperparathyroidism of renal origin ([place]) 02/19/2019  . Coagulation defect, unspecified ([place]) 02/19/2019  . Venous (peripheral) insufficiency 08/06/2017  . Encounter for screening for respiratory tuberculosis 01/01/2016  . Hypertension   . 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 Diabetic gastroparesis ([place]) 08/09/2015  . Low back pain 06/09/2014  . Nausea and vomiting 01/18/2012  . Diarrhea, unspecified 01/18/2012  . Obstructive sleep apnea of adult 01/16/2012  . GERD (gastroesophageal reflux disease) 01/16/2012  . Osteoarthritis of both knees 01/16/2012  . Hyperlipidemia, unspecified 07/18/2011  . LEIOMYOMA, UTERUS 01/14/2007  . Morbid obesity ([place]) 07/03/2006  . Former smoker 07/03/2006  . Tension headache 07/03/2006    Past Medical History: Past Medical History:  Diagnosis Date  . Accelerated hypertension   . Aortic atherosclerosis ([place])   . Arthritis of knee    bilateral  . Diabetes mellitus    type 2 - no meds  . Diabetic gastroparesis ([place])   . Diverticulosis   . ESRD (end  stage renal disease) ([place])   . Fibroid uterus   . GERD (gastroesophageal reflux disease)   . Hyperlipidemia   . Hypertension   . HYPERTENSION, BENIGN SYSTEMIC 07/03/2006        . Hypertensive emergency 02/05/2019  . Hypokalemia   . IDA (iron deficiency anemia)   . Nausea   . Renal disorder   . Umbilical hernia   . Wears dentures     Past Surgical History: Past Surgical History:   Procedure Laterality Date  . AV FISTULA PLACEMENT Left 02/17/2019   Procedure: ARTERIOVENOUS (AV) FISTULA CREATION LEFT UPPER ARM;  Surgeon: Waynetta Sandy, MD;  Location: [facility];  Service: Vascular;  Laterality: Left;  . CATARACT EXTRACTION     right eye  . CESAREAN SECTION     x2  . CHOLECYSTECTOMY     laparoscopic  . FISTULA SUPERFICIALIZATION Left 04/06/2019   Procedure: FISTULA SUPERFICIALIZATION LEFT BRACHIOCEPHALIC;  Surgeon: Waynetta Sandy, MD;  Location: [facility];  Service: Vascular;  Laterality: Left;  Transposition left arm brachiocephalic fistula.  . IR FLUORO GUIDE CV LINE RIGHT  02/09/2019  . IR FLUORO GUIDE CV LINE RIGHT  03/05/2019  . IR US GUIDE VASC ACCESS RIGHT  02/09/2019  . MULTIPLE TOOTH EXTRACTIONS    . REDUCTION MAMMAPLASTY Bilateral     Social History: Social History   Tobacco Use  . Smoking status: Former Smoker    Years: 10.00    Types: Cigarettes    Quit date: 10/16/2011    Years since quitting: 7.9  . Smokeless tobacco: Never Used  . Tobacco comment: 1 pack will last a week  Substance Use Topics  . Alcohol use: No    Alcohol/week: 0.0 standard drinks  . Drug use: No   Additional social history: works at [employer] Please also refer to relevant sections of EMR.  Family History: Family History  Problem Relation Age of Onset  . Hypertension Mother   . Diabetes Mother   . Cancer Father        lung  . Colon cancer Neg Hx   . Stomach cancer Neg Hx   . Esophageal cancer Neg Hx    Allergies and Medications: Allergies  Allergen Reactions  . Lisinopril Anaphylaxis and Other (See Comments)    angioedema  . Venofer  [Ferric Oxide]     Other reaction(s): Back Pain  . Camellia Swelling and Other (See Comments)    Angioedema   . Jardiance [Empagliflozin] Swelling and Rash   No current facility-administered medications on file prior to encounter.   Current Outpatient Medications on File Prior to Encounter  Medication Sig Dispense  Refill  . Accu-Chek Softclix Lancets lancets Use as instructed (Patient taking differently: 1 each by Other route See admin instructions. Use as instructed) 100 each 12  . acetaminophen (TYLENOL) 500 MG tablet Take 1,000 mg by mouth every 6 (six) hours as needed for moderate pain or headache.    Marland Kitchen aspirin EC 325 MG tablet Take 325 mg by mouth daily.    . B Complex-C-Folic Acid (DIALYVITE 893) 0.8 MG TABS Take 1 tablet by mouth 3 (three) times a week. M, w, f    . Blood Glucose Monitoring Suppl (ACCU-CHEK AVIVA PLUS) w/Device KIT Use to check sugar three times a day (Patient taking differently: 1 each by Other route in the morning, at noon, and at bedtime. ) 1 kit 0  . glucose blood (ACCU-CHEK AVIVA PLUS) test strip Use as instructed (Patient taking differently: 1 each  by Other route See admin instructions. Use as instructed) 100 each 12  . Lancets (ACCU-CHEK SOFT TOUCH) lancets Use to check sugars three times a day (Patient taking differently: 1 each by Other route in the morning, at noon, and at bedtime. ) 100 each 12  . metoCLOPramide (REGLAN) 5 MG tablet Take 1 tablet (5 mg total) by mouth 4 (four) times daily -  before meals and at bedtime. (Patient taking differently: Take 5 mg by mouth 3 (three) times daily before meals. ) 120 tablet 2  . ondansetron (ZOFRAN ODT) 4 MG disintegrating tablet Take 1-2 tablets (4-8 mg total) by mouth every 8 (eight) hours as needed for nausea or vomiting. 70 tablet 2  . Vitamin D, Ergocalciferol, (DRISDOL) 1.25 MG (50000 UT) CAPS capsule Take 50,000 Units by mouth every Monday.       Objective: BP 128/66   Pulse 85   Temp 98.4 F (36.9 C) (Oral)   Resp 20   Ht 5' 7" (1.702 m)   Wt 106.9 kg   LMP  (LMP Unknown)   SpO2 100%   BMI 36.91 kg/m  Exam: General: Very pleasant 59 yo AA woman resting comfortably in bed, NAD Eyes: anicteric sclerae ENTM: clear oropharynx, upper dentures Neck: supple, no JVD Cardiovascular: rrr, no m/r/g Respiratory: CTAB, no  increased WOB, no wheezes/rales/rhonchi Gastrointestinal: soft, NT, ND, normal bowel sounds present MSK: warm, dry, no BLEE Derm: no rashes or lesions Neuro: Cranial Nerves: II:  PERRL.  III,IV, VI: EOMI without ptosis or diplopia.  V: Facial sensation is asymmetric to touch, left side feels "rougher" than right VII: Facial movement is asymmetric, slight droop on left side   VIII: hearing is intact to voice X: Palate elevates symmetrically XI: Shoulder shrug is asymmetric, 2/5 strength in LUE XII: tongue is midline without atrophy or fasciculations.  Motor: Tone is normal. Bulk is normal. 5/5 strength was present in RUE and RLE, 2/5 strength present in LUE and LLE.   Deep Tendon Reflexes: normal in RUE and RLE, diminished on LUE and LLE. Psych: normal mood, tearful because of diagnosis, full affect  Labs and Imaging: CBC BMET  Recent Labs  Lab 09/18/19 1227 09/18/19 1227 09/18/19 1236  WBC 9.6  --   --   HGB 9.5*   < > 10.5*  HCT 33.1*   < > 31.0*  PLT 468*  --   --    < > = values in this interval not displayed.   Recent Labs  Lab 09/18/19 1227 09/18/19 1227 09/18/19 1236  NA 135   < > 134*  K 4.2   < > 4.3  CL 93*   < > 98  CO2 24  --   --   BUN 29*   < > 31*  CREATININE 7.97*   < > 9.20*  GLUCOSE 173*   < > 170*  CALCIUM 9.9  --   --    < > = values in this interval not displayed.     EKG: EKG Interpretation  Date/Time:  Saturday Sep 18 2019 12:56:09 EDT Ventricular Rate:  88 PR Interval:    QRS Duration: 102 QT Interval:  389 QTC Calculation: 471 R Axis:   20 Text Interpretation: Sinus rhythm Minimal ST depression, lateral leads When compared to prior, slower rate. No STEMI Confirmed by Antony Blackbird [id number]) on 09/18/2019 1:18:00 PM  CT Code Stroke CTA Head W/WO contrast  Result Date: 09/18/2019 CLINICAL DATA:  Acute presentation with left sided weakness  and left facial droop. Slurred speech. EXAM: CT ANGIOGRAPHY HEAD AND NECK TECHNIQUE: Multidetector CT  imaging of the head and neck was performed using the standard protocol during bolus administration of intravenous contrast. Multiplanar CT image reconstructions and MIPs were obtained to evaluate the vascular anatomy. Carotid stenosis measurements (when applicable) are obtained utilizing NASCET criteria, using the distal internal carotid diameter as the denominator. CONTRAST:  Not annotated. COMPARISON:  Head CT same day FINDINGS: CTA NECK FINDINGS Aortic arch: Aortic atherosclerosis. Branching pattern is normal without origin stenosis. Right carotid system: Common carotid artery widely patent to the bifurcation. Minimal atherosclerotic plaque at the carotid bifurcation and ICA bulb but no stenosis. Cervical ICA widely patent. Left carotid system: Common carotid artery widely patent to the bifurcation. Mild atherosclerotic plaque at the ICA bulb. Minimal diameter 3.5 mm. Compared to a more distal cervical ICA diameter of 4 mm, this indicates a 10-20% stenosis. Vertebral arteries: Both vertebral arteries are patent through the cervical region. Right vertebral artery takes an early origin from the proximal subclavian. Skeleton: Ordinary mid cervical spondylosis. Other neck: No mass or lymphadenopathy. Upper chest: Normal Review of the MIP images confirms the above findings CTA HEAD FINDINGS Anterior circulation: Both internal carotid arteries are patent through the skull base and siphon regions. There is mild atherosclerotic calcification in the carotid siphon regions but no stenosis greater than 30%. The anterior and middle cerebral vessels are patent without proximal stenosis, aneurysm or vascular malformation. No large or medium vessel occlusion. Posterior circulation: Both vertebral arteries widely patent to the basilar. No basilar stenosis. Posterior circulation branch vessels are normal. Patent posterior communicating arteries. Venous sinuses: Normal Anatomic variants: None Review of the MIP images confirms the  above findings IMPRESSION: No acute large or medium vessel occlusion. Aortic Atherosclerosis (ICD10-I70.0). Atherosclerotic change at both carotid bifurcations. No stenosis on the right. 10-20% ICA bulb stenosis on the left. Ordinary atherosclerotic change in the carotid siphon regions but without stenosis greater than 30%. Electronically Signed   By: Nelson Chimes M.D.   On: 09/18/2019 12:51   CT Code Stroke CTA Neck W/WO contrast  Result Date: 09/18/2019 CLINICAL DATA:  Acute presentation with left sided weakness and left facial droop. Slurred speech. EXAM: CT ANGIOGRAPHY HEAD AND NECK TECHNIQUE: Multidetector CT imaging of the head and neck was performed using the standard protocol during bolus administration of intravenous contrast. Multiplanar CT image reconstructions and MIPs were obtained to evaluate the vascular anatomy. Carotid stenosis measurements (when applicable) are obtained utilizing NASCET criteria, using the distal internal carotid diameter as the denominator. CONTRAST:  Not annotated. COMPARISON:  Head CT same day FINDINGS: CTA NECK FINDINGS Aortic arch: Aortic atherosclerosis. Branching pattern is normal without origin stenosis. Right carotid system: Common carotid artery widely patent to the bifurcation. Minimal atherosclerotic plaque at the carotid bifurcation and ICA bulb but no stenosis. Cervical ICA widely patent. Left carotid system: Common carotid artery widely patent to the bifurcation. Mild atherosclerotic plaque at the ICA bulb. Minimal diameter 3.5 mm. Compared to a more distal cervical ICA diameter of 4 mm, this indicates a 10-20% stenosis. Vertebral arteries: Both vertebral arteries are patent through the cervical region. Right vertebral artery takes an early origin from the proximal subclavian. Skeleton: Ordinary mid cervical spondylosis. Other neck: No mass or lymphadenopathy. Upper chest: Normal Review of the MIP images confirms the above findings CTA HEAD FINDINGS Anterior  circulation: Both internal carotid arteries are patent through the skull base and siphon regions. There is mild atherosclerotic calcification  in the carotid siphon regions but no stenosis greater than 30%. The anterior and middle cerebral vessels are patent without proximal stenosis, aneurysm or vascular malformation. No large or medium vessel occlusion. Posterior circulation: Both vertebral arteries widely patent to the basilar. No basilar stenosis. Posterior circulation branch vessels are normal. Patent posterior communicating arteries. Venous sinuses: Normal Anatomic variants: None Review of the MIP images confirms the above findings IMPRESSION: No acute large or medium vessel occlusion. Aortic Atherosclerosis (ICD10-I70.0). Atherosclerotic change at both carotid bifurcations. No stenosis on the right. 10-20% ICA bulb stenosis on the left. Ordinary atherosclerotic change in the carotid siphon regions but without stenosis greater than 30%. Electronically Signed   By: Nelson Chimes M.D.   On: 09/18/2019 12:51   CT HEAD CODE STROKE WO CONTRAST  Result Date: 09/18/2019 CLINICAL DATA:  Code stroke. Acute stroke with left-sided weakness and left facial droop. Slurred speech. EXAM: CT HEAD WITHOUT CONTRAST TECHNIQUE: Contiguous axial images were obtained from the base of the skull through the vertex without intravenous contrast. COMPARISON:  01/18/2012. FINDINGS: Brain: Chronic small-vessel ischemic changes of the hemispheric white matter. 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 No sign of acute infarction, mass lesion, hemorrhage, hydrocephalus or extra-axial collection. Vascular: There is atherosclerotic calcification of the major vessels at the base of the brain. Skull: Negative Sinuses/Orbits: Clear/normal Other: None ASPECTS ([place] Stroke Program Early CT Score) - Ganglionic level infarction (caudate, lentiform nuclei, internal capsule, insula, M1-M3 cortex): 7 - Supraganglionic infarction  (M4-M6 cortex): 3 Total score (0-10 with 10 being normal): 10 IMPRESSION: 1. No acute finding. Mild chronic small-vessel change of the hemispheric white matter. 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 2. ASPECTS is 10. 3. These results were communicated to Dr. Leonel Ramsay at 12:38 pmon 5/15/2021by text page via the [facility] messaging system. Electronically Signed   By: Nelson Chimes M.D.   On: 09/18/2019 12:39   Gladys Damme, MD 09/18/2019, 1:50 PM PGY-1, [facility] Intern pager: [phone], text pages welcome  FPTS Upper-Level Resident Addendum I have independently interviewed and examined the patient. I have discussed the above with the original author and agree with their documentation. My edits for correction/addition/clarification are in - purple. Please see also any attending notes.  [facility] pager: [phone] (text pages welcome through AMION)  Wilber Oliphant, M.D.  PGY-2 09/18/2019 6:56 PM

## 2019-09-18 NOTE — ED Triage Notes (Signed)
Pt here as Code Stroke-last seen normal at 1105 when she got in the car to go to her sister's house. On arrival to her sister's house, pt had L arm and leg weakness, L facial droop, and slurred speech. Slurred speech resolved before EMS arrival. Dialysis MWF, had full tx yesterday.

## 2019-09-18 NOTE — ED Provider Notes (Signed)
[facility] EMERGENCY DEPARTMENT Provider Note   CSN: [id number] Arrival date & time: 09/18/19  1223     History Chief Complaint  Patient presents with  . Code Stroke    [name] is a 59 y.o. female.  The history is provided by the patient and medical records. No language interpreter was used.  Neurologic Problem This is a new problem. The current episode started 1 to 2 hours ago. The problem occurs constantly. The problem has been gradually improving. Pertinent negatives include no chest pain, no abdominal pain, no headaches and no shortness of breath. Nothing aggravates the symptoms. Nothing relieves the symptoms. She has tried nothing for the symptoms. The treatment provided no relief.       Past Medical History:  Diagnosis Date  . Accelerated hypertension   . Aortic atherosclerosis ([place])   . Arthritis of knee    bilateral  . Diabetes mellitus    type 2 - no meds  . Diabetic gastroparesis ([place])   . Diverticulosis   . ESRD (end stage renal disease) ([place])   . Fibroid uterus   . GERD (gastroesophageal reflux disease)   . Hyperlipidemia   . Hypertension   . HYPERTENSION, BENIGN SYSTEMIC 07/03/2006        . Hypertensive emergency 02/05/2019  . Hypokalemia   . IDA (iron deficiency anemia)   . Nausea   . Renal disorder   . Umbilical hernia   . Wears dentures     Patient Active Problem List   Diagnosis Date Noted  . Intractable nausea and vomiting 04/20/2019  . Atypical chest pain 04/02/2019  . Type 2 diabetes mellitus without complications ([place]) 40/98/1191  . Anemia in chronic kidney disease 02/19/2019  . ESRD (end stage renal disease) ([place]) 02/19/2019  . Secondary hyperparathyroidism of renal origin ([place]) 02/19/2019  . Coagulation defect, unspecified ([place]) 02/19/2019  . Venous (peripheral) insufficiency 08/06/2017  . Encounter for screening for respiratory tuberculosis 01/01/2016  . Hypertension   . 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 Diabetic gastroparesis ([place]) 08/09/2015  . Low back pain 06/09/2014  . Nausea and vomiting 01/18/2012  . Diarrhea, unspecified 01/18/2012  . Obstructive sleep apnea of adult 01/16/2012  . GERD (gastroesophageal reflux disease) 01/16/2012  . Osteoarthritis of both knees 01/16/2012  . Hyperlipidemia, unspecified 07/18/2011  . LEIOMYOMA, UTERUS 01/14/2007  . Morbid obesity ([place]) 07/03/2006  . Former smoker 07/03/2006  . Tension headache 07/03/2006    Past Surgical History:  Procedure Laterality Date  . AV FISTULA PLACEMENT Left 02/17/2019   Procedure: ARTERIOVENOUS (AV) FISTULA CREATION LEFT UPPER ARM;  Surgeon: Waynetta Sandy, MD;  Location: [facility];  Service: Vascular;  Laterality: Left;  . CATARACT EXTRACTION     right eye  . CESAREAN SECTION     x2  . CHOLECYSTECTOMY     laparoscopic  . FISTULA SUPERFICIALIZATION Left 04/06/2019   Procedure: FISTULA SUPERFICIALIZATION LEFT BRACHIOCEPHALIC;  Surgeon: Waynetta Sandy, MD;  Location: [facility];  Service: Vascular;  Laterality: Left;  Transposition left arm brachiocephalic fistula.  . IR FLUORO GUIDE CV LINE RIGHT  02/09/2019  . IR FLUORO GUIDE CV LINE RIGHT  03/05/2019  . IR US GUIDE VASC ACCESS RIGHT  02/09/2019  . MULTIPLE TOOTH EXTRACTIONS    . REDUCTION MAMMAPLASTY Bilateral      OB History   No obstetric history on file.     Family History  Problem Relation Age of Onset  . Hypertension  Mother   . Diabetes Mother   . Cancer Father        lung  . Colon cancer Neg Hx   . Stomach cancer Neg Hx   . Esophageal cancer Neg Hx     Social History   Tobacco Use  . Smoking status: Former Smoker    Years: 10.00    Types: Cigarettes    Quit date: 10/16/2011    Years since quitting: 7.9  . Smokeless tobacco: Never Used  . Tobacco comment: 1 pack will last a week  Substance Use Topics  . Alcohol use: No    Alcohol/week: 0.0 standard drinks  . Drug use: No    Home  Medications Prior to Admission medications   Medication Sig Start Date End Date Taking? Authorizing Provider  Accu-Chek Softclix Lancets lancets Use as instructed 09/29/18   Diallo, Earna Coder, MD  acetaminophen (TYLENOL) 500 MG tablet Take 1,000 mg by mouth every 6 (six) hours as needed for moderate pain or headache.    [provider]  aspirin EC 325 MG tablet Take 325 mg by mouth daily.    [provider]  B Complex-C-Folic Acid (DIALYVITE 761) 0.8 MG TABS Take 1 tablet by mouth daily. 03/02/19   [provider]  Blood Glucose Monitoring Suppl (ACCU-CHEK AVIVA PLUS) w/Device KIT Use to check sugar three times a day 05/14/17   [name], MD  glucose blood (ACCU-CHEK AVIVA PLUS) test strip Use as instructed 09/29/18   Diallo, Earna Coder, MD  Lancets (ACCU-CHEK SOFT TOUCH) lancets Use to check sugars three times a day 09/24/18   Diallo, Earna Coder, MD  metoCLOPramide (REGLAN) 5 MG tablet Take 1 tablet (5 mg total) by mouth 4 (four) times daily -  before meals and at bedtime. 06/29/19   Pyrtle, Lajuan Lines, MD  ondansetron (ZOFRAN ODT) 4 MG disintegrating tablet Take 1-2 tablets (4-8 mg total) by mouth every 8 (eight) hours as needed for nausea or vomiting. 06/29/19   Pyrtle, Lajuan Lines, MD  Vitamin D, Ergocalciferol, (DRISDOL) 1.25 MG (50000 UT) CAPS capsule Take 50,000 Units by mouth every Monday.     [provider]    Allergies    Lisinopril, Venofer  [ferric oxide], Camellia, and Jardiance [empagliflozin]  Review of Systems   Review of Systems  Constitutional: Negative for chills, diaphoresis, fatigue and fever.  HENT: Negative for congestion.   Respiratory: Negative for cough, chest tightness and shortness of breath.   Cardiovascular: Negative for chest pain.  Gastrointestinal: Negative for abdominal pain, constipation, diarrhea, nausea and vomiting.  Genitourinary: Negative for flank pain.  Musculoskeletal: Negative for back pain, neck pain and neck  stiffness.  Neurological: Positive for facial asymmetry, speech difficulty and weakness. Negative for dizziness, seizures, numbness and headaches.  Psychiatric/Behavioral: Negative for agitation.    Physical Exam Updated Vital Signs BP 136/72   Pulse 87   Temp 98.4 F (36.9 C) (Oral)   Resp 16   Ht '5\' 7"'$  (1.702 m)   Wt 106.9 kg   LMP  (LMP Unknown)   SpO2 99%   BMI 36.91 kg/m   Physical Exam Vitals and nursing note reviewed.  Constitutional:      General: She is not in acute distress.    Appearance: She is well-developed. She is not diaphoretic.  HENT:     Head: Atraumatic.     Mouth/Throat:     Mouth: Mucous membranes are moist.     Pharynx: No oropharyngeal exudate or posterior oropharyngeal erythema.  Eyes:     Extraocular Movements: Extraocular movements intact.     Conjunctiva/sclera: Conjunctivae normal.     Pupils: Pupils are equal, round, and reactive to light.  Cardiovascular:     Rate and Rhythm: Normal rate and regular rhythm.     Pulses: Normal pulses.     Heart sounds: No murmur.  Pulmonary:     Effort: Pulmonary effort is normal. No respiratory distress.     Breath sounds: Normal breath sounds.  Abdominal:     Palpations: Abdomen is soft.     Tenderness: There is no abdominal tenderness. There is no right CVA tenderness, left CVA tenderness, guarding or rebound.  Musculoskeletal:        General: No tenderness.     Cervical back: Neck supple. No tenderness.     Right lower leg: No edema.     Left lower leg: No edema.  Skin:    General: Skin is warm and dry.     Capillary Refill: Capillary refill takes less than 2 seconds.     Findings: No erythema or rash.  Neurological:     Mental Status: She is alert.     GCS: GCS eye subscore is 4. GCS verbal subscore is 5. GCS motor subscore is 6.     Cranial Nerves: Cranial nerve deficit present.     Sensory: No sensory deficit.     Motor: Weakness and abnormal muscle tone present. No tremor or seizure  activity.     Comments: Subtle left facial droop.  Weakness in left arm and left leg.  No numbness.  Speech was clear for me but was reportedly abnormal prior to arrival.     ED Results / Procedures / Treatments   Labs (all labs ordered are listed, but only abnormal results are displayed) Labs Reviewed  CBC - Abnormal; Notable for the following components:      Result Value   Hemoglobin 9.5 (*)    HCT 33.1 (*)    MCV 79.6 (*)    MCH 22.8 (*)    MCHC 28.7 (*)    RDW 16.0 (*)    Platelets 468 (*)    nRBC 0.6 (*)    All other components within normal limits  COMPREHENSIVE METABOLIC PANEL - Abnormal; Notable for the following components:   Chloride 93 (*)    Glucose, Bld 173 (*)    BUN 29 (*)    Creatinine, Ser 7.97 (*)    AST 10 (*)    GFR calc non Af Amer 5 (*)    GFR calc Af Amer 6 (*)    Anion gap 18 (*)    All other components within normal limits  I-STAT CHEM 8, ED - Abnormal; Notable for the following components:   Sodium 134 (*)    BUN 31 (*)    Creatinine, Ser 9.20 (*)    Glucose, Bld 170 (*)    Calcium, Ion 1.03 (*)    Hemoglobin 10.5 (*)    HCT 31.0 (*)    All other components within normal limits  CBG MONITORING, ED - Abnormal; Notable for the following components:   Glucose-Capillary 159 (*)    All other components within normal limits  I-STAT BETA HCG BLOOD, ED ([facility], [facility], [facility] ONLY) - Abnormal; Notable for the following components:   I-stat hCG, quantitative 9.4 (*)    All other components within normal limits  SARS CORONAVIRUS 2 BY RT PCR (HOSPITAL ORDER, [facility])  PROTIME-INR  APTT  DIFFERENTIAL    EKG EKG Interpretation  Date/Time:  Saturday Sep 18 2019 12:56:09 EDT Ventricular Rate:  88 PR Interval:    QRS Duration: 102 QT Interval:  389 QTC Calculation: 471 R Axis:   20 Text Interpretation: Sinus rhythm Minimal ST depression, lateral leads When compared to prior, slower rate. No STEMI Confirmed by Antony Blackbird  [phone]) on 09/18/2019 1:18:00 PM   Radiology CT Code Stroke CTA Head W/WO contrast  Result Date: 09/18/2019 CLINICAL DATA:  Acute presentation with left sided weakness and left facial droop. Slurred speech. EXAM: CT ANGIOGRAPHY HEAD AND NECK TECHNIQUE: Multidetector CT imaging of the head and neck was performed using the standard protocol during bolus administration of intravenous contrast. Multiplanar CT image reconstructions and MIPs were obtained to evaluate the vascular anatomy. Carotid stenosis measurements (when applicable) are obtained utilizing NASCET criteria, using the distal internal carotid diameter as the denominator. CONTRAST:  Not annotated. COMPARISON:  Head CT same day FINDINGS: CTA NECK FINDINGS Aortic arch: Aortic atherosclerosis. Branching pattern is normal without origin stenosis. Right carotid system: Common carotid artery widely patent to the bifurcation. Minimal atherosclerotic plaque at the carotid bifurcation and ICA bulb but no stenosis. Cervical ICA widely patent. Left carotid system: Common carotid artery widely patent to the bifurcation. Mild atherosclerotic plaque at the ICA bulb. Minimal diameter 3.5 mm. Compared to a more distal cervical ICA diameter of 4 mm, this indicates a 10-20% stenosis. Vertebral arteries: Both vertebral arteries are patent through the cervical region. Right vertebral artery takes an early origin from the proximal subclavian. Skeleton: Ordinary mid cervical spondylosis. Other neck: No mass or lymphadenopathy. Upper chest: Normal Review of the MIP images confirms the above findings CTA HEAD FINDINGS Anterior circulation: Both internal carotid arteries are patent through the skull base and siphon regions. There is mild atherosclerotic calcification in the carotid siphon regions but no stenosis greater than 30%. The anterior and middle cerebral vessels are patent without proximal stenosis, aneurysm or vascular malformation. No large or medium vessel  occlusion. Posterior circulation: Both vertebral arteries widely patent to the basilar. No basilar stenosis. Posterior circulation branch vessels are normal. Patent posterior communicating arteries. Venous sinuses: Normal Anatomic variants: None Review of the MIP images confirms the above findings IMPRESSION: No acute large or medium vessel occlusion. Aortic Atherosclerosis (ICD10-I70.0). Atherosclerotic change at both carotid bifurcations. No stenosis on the right. 10-20% ICA bulb stenosis on the left. Ordinary atherosclerotic change in the carotid siphon regions but without stenosis greater than 30%. Electronically Signed   By: Nelson Chimes M.D.   On: 09/18/2019 12:51   CT Code Stroke CTA Neck W/WO contrast  Result Date: 09/18/2019 CLINICAL DATA:  Acute presentation with left sided weakness and left facial droop. Slurred speech. EXAM: CT ANGIOGRAPHY HEAD AND NECK TECHNIQUE: Multidetector CT imaging of the head and neck was performed using the standard protocol during bolus administration of intravenous contrast. Multiplanar CT image reconstructions and MIPs were obtained to evaluate the vascular anatomy. Carotid stenosis measurements (when applicable) are obtained utilizing NASCET criteria, using the distal internal carotid diameter as the denominator. CONTRAST:  Not annotated. COMPARISON:  Head CT same day FINDINGS: CTA NECK FINDINGS Aortic arch: Aortic atherosclerosis. Branching pattern is normal without origin stenosis. Right carotid system: Common carotid artery widely patent to the bifurcation. Minimal atherosclerotic plaque at the carotid bifurcation and ICA bulb but no stenosis. Cervical ICA widely patent. Left carotid system: Common carotid artery widely patent to the bifurcation. Mild atherosclerotic plaque  at the ICA bulb. Minimal diameter 3.5 mm. Compared to a more distal cervical ICA diameter of 4 mm, this indicates a 10-20% stenosis. Vertebral arteries: Both vertebral arteries are patent through  the cervical region. Right vertebral artery takes an early origin from the proximal subclavian. Skeleton: Ordinary mid cervical spondylosis. Other neck: No mass or lymphadenopathy. Upper chest: Normal Review of the MIP images confirms the above findings CTA HEAD FINDINGS Anterior circulation: Both internal carotid arteries are patent through the skull base and siphon regions. There is mild atherosclerotic calcification in the carotid siphon regions but no stenosis greater than 30%. The anterior and middle cerebral vessels are patent without proximal stenosis, aneurysm or vascular malformation. No large or medium vessel occlusion. Posterior circulation: Both vertebral arteries widely patent to the basilar. No basilar stenosis. Posterior circulation branch vessels are normal. Patent posterior communicating arteries. Venous sinuses: Normal Anatomic variants: None Review of the MIP images confirms the above findings IMPRESSION: No acute large or medium vessel occlusion. Aortic Atherosclerosis (ICD10-I70.0). Atherosclerotic change at both carotid bifurcations. No stenosis on the right. 10-20% ICA bulb stenosis on the left. Ordinary atherosclerotic change in the carotid siphon regions but without stenosis greater than 30%. Electronically Signed   By: Nelson Chimes M.D.   On: 09/18/2019 12:51   CT HEAD CODE STROKE WO CONTRAST  Result Date: 09/18/2019 CLINICAL DATA:  Code stroke. Acute stroke with left-sided weakness and left facial droop. Slurred speech. EXAM: CT HEAD WITHOUT CONTRAST TECHNIQUE: Contiguous axial images were obtained from the base of the skull through the vertex without intravenous contrast. COMPARISON:  01/18/2012. FINDINGS: Brain: Chronic small-vessel ischemic changes of the hemispheric white matter. 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 No sign of acute infarction, mass lesion, hemorrhage, hydrocephalus or extra-axial collection. Vascular: There is atherosclerotic calcification of the  major vessels at the base of the brain. Skull: Negative Sinuses/Orbits: Clear/normal Other: None ASPECTS ([place] Stroke Program Early CT Score) - Ganglionic level infarction (caudate, lentiform nuclei, internal capsule, insula, M1-M3 cortex): 7 - Supraganglionic infarction (M4-M6 cortex): 3 Total score (0-10 with 10 being normal): 10 IMPRESSION: 1. No acute finding. Mild chronic small-vessel change of the hemispheric white matter. 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 2. ASPECTS is 10. 3. These results were communicated to Dr. Leonel Ramsay at 12:38 pmon 5/15/2021by text page via the [facility] messaging system. Electronically Signed   By: Nelson Chimes M.D.   On: 09/18/2019 12:39    Procedures Procedures (including critical care time)  Medications Ordered in ED Medications  sodium chloride flush (NS) 0.9 % injection 3 mL (3 mLs Intravenous Not Given 09/18/19 1302)  iohexol (OMNIPAQUE) 350 MG/ML injection 75 mL (75 mLs Intravenous Contrast Given 09/18/19 1246)    ED Course  I have reviewed the triage vital signs and the nursing notes.  Pertinent labs & imaging results that were available during my care of the patient were reviewed by me and considered in my medical decision making (see chart for details).    MDM Rules/Calculators/A&P                      [name] is a 59 y.o. female with a past medical history significant for hypertension, hyperlipidemia, diabetes, and ESRD on dialysis MWF who presents as a code stroke for left-sided weakness in her face, arm, leg, slurred speech, and facial droop.  Patient's last normal was 11:05 AM, within the last 2 hours, when she was driving with her sister to  a baseball game.  She reports that the patient's had sudden onset of symptoms.  Speech has improved but patient continues to have the left-sided weakness prompting her to be activated as a code stroke.  Patient has never had stroke before.  On arrival, I evaluated patient at the bridge  and she had airway that was intact.  Speech was clear to me.  Lungs clear and chest nontender.  Abdomen nontender.  Patient has Band-Aids on left fistula area with no significant tenderness.  Patient does have weakness in left arm and left leg as well as subtle droop to the left face.  Sensation was intact.  Pupils are symmetric and reactive with normal extraocular movements.  CT imaging did not show acute hemorrhage.  Patient was offered TPA by neurology however patient refused.  Patient's work-up otherwise showed negative for Covid testing.  Creatinine is elevated as expected with dialysis.  Potassium was not elevated.  Admitting team will likely touch base with nephrology to make sure she does not need dialysis during her admission.  Patient will be admitted to medicine service for further management of acute stroke.   Final Clinical Impression(s) / ED Diagnoses Final diagnoses:  Left-sided weakness  Acute ischemic stroke (HCC)    Clinical Impression: 1. Left-sided weakness   2. Acute ischemic stroke [facility])     Disposition: Admit  This note was prepared with assistance of Dragon voice recognition software. Occasional wrong-word or sound-a-like substitutions may have occurred due to the inherent limitations of voice recognition software.     Alveria Mcglaughlin, Gwenyth Allegra, MD 09/18/19 1630

## 2019-09-18 NOTE — Consult Note (Addendum)
NEURO HOSPITALIST  CONSULT   Requesting Physician: Dr. Gustavus Messing    Chief Complaint:   History obtained from:  Patient     HPI:                                                                                                                                         [name] is an 59 y.o. female ESRD ( on dialysis MWF), HLD, HTN, DM who presented to [facility] ED as a code stroke for sudden onset left side weakness, slurred speech and facial droop.   Patient woke up this morning in her usual state of health. She was on the way to her sister house and had a sudden onset of left side weakness, slurred speech and left facial droop. Patient's sister came out to the car and saw the facial droop and witnessed the slurred speech. The speech had resolved by the time EMS arrived. Denies any CP, anticoagulation. Per EMS patient has been having clots while at dialysis, no vision changes. No prior stroke history.  ED course:  CTH: no hemorrhage CTA: no LVO BP: 119/88 BG: 174    Date last known well: 09/18/2019 Time last known well: 1105 tPA Given: No: patient declined TPA offer Modified Rankin: Rankin Score=0 NIHSS:5     Past Medical History:  Diagnosis Date  . Accelerated hypertension   . Aortic atherosclerosis ([place])   . Arthritis of knee    bilateral  . Diabetes mellitus    type 2 - no meds  . Diabetic gastroparesis ([place])   . Diverticulosis   . ESRD (end stage renal disease) ([place])   . Fibroid uterus   . GERD (gastroesophageal reflux disease)   . Hyperlipidemia   . Hypertension   . HYPERTENSION, BENIGN SYSTEMIC 07/03/2006        . Hypertensive emergency 02/05/2019  . Hypokalemia   . IDA (iron deficiency anemia)   . Nausea   . Renal disorder   . Umbilical hernia   . Wears dentures     Past Surgical History:  Procedure Laterality Date  . AV FISTULA PLACEMENT Left 02/17/2019   Procedure: ARTERIOVENOUS (AV) FISTULA CREATION LEFT UPPER  ARM;  Surgeon: Waynetta Sandy, MD;  Location: [place];  Service: Vascular;  Laterality: Left;  . CATARACT EXTRACTION     right eye  . CESAREAN SECTION     x2  . CHOLECYSTECTOMY     laparoscopic  . FISTULA SUPERFICIALIZATION Left 04/06/2019   Procedure: FISTULA SUPERFICIALIZATION LEFT BRACHIOCEPHALIC;  Surgeon: Waynetta Sandy, MD;  Location: [place];  Service: Vascular;  Laterality: Left;  Transposition left arm  brachiocephalic fistula.  . IR FLUORO GUIDE CV LINE RIGHT  02/09/2019  . IR FLUORO GUIDE CV LINE RIGHT  03/05/2019  . IR US GUIDE VASC ACCESS RIGHT  02/09/2019  . MULTIPLE TOOTH EXTRACTIONS    . REDUCTION MAMMAPLASTY Bilateral     Family History  Problem Relation Age of Onset  . Hypertension Mother   . Diabetes Mother   . Cancer Father        lung  . Colon cancer Neg Hx   . Stomach cancer Neg Hx   . Esophageal cancer Neg Hx          Social History:  reports that she quit smoking about 7 years ago. Her smoking use included cigarettes. She quit after 10.00 years of use. She has never used smokeless tobacco. She reports that she does not drink alcohol or use drugs.  Allergies:  Allergies  Allergen Reactions  . Lisinopril Anaphylaxis and Other (See Comments)    angioedema  . Venofer  [Ferric Oxide]     Other reaction(s): Back Pain  . Camellia Swelling and Other (See Comments)    Angioedema   . Jardiance [Empagliflozin] Swelling and Rash    Medications:                                                                                                                           Current Facility-Administered Medications  Medication Dose Route Frequency Provider Last Rate Last Admin  . sodium chloride flush (NS) 0.9 % injection 3 mL  3 mL Intravenous Once Tegeler, Gwenyth Allegra, MD       Current Outpatient Medications  Medication Sig Dispense Refill  . Accu-Chek Softclix Lancets lancets Use as instructed 100 each 12  . acetaminophen (TYLENOL) 500 MG tablet  Take 1,000 mg by mouth every 6 (six) hours as needed for moderate pain or headache.    Marland Kitchen aspirin EC 325 MG tablet Take 325 mg by mouth daily.    . B Complex-C-Folic Acid (DIALYVITE 161) 0.8 MG TABS Take 1 tablet by mouth daily.    . Blood Glucose Monitoring Suppl (ACCU-CHEK AVIVA PLUS) w/Device KIT Use to check sugar three times a day 1 kit 0  . glucose blood (ACCU-CHEK AVIVA PLUS) test strip Use as instructed 100 each 12  . Lancets (ACCU-CHEK SOFT TOUCH) lancets Use to check sugars three times a day 100 each 12  . metoCLOPramide (REGLAN) 5 MG tablet Take 1 tablet (5 mg total) by mouth 4 (four) times daily -  before meals and at bedtime. 120 tablet 2  . ondansetron (ZOFRAN ODT) 4 MG disintegrating tablet Take 1-2 tablets (4-8 mg total) by mouth every 8 (eight) hours as needed for nausea or vomiting. 70 tablet 2  . Vitamin D, Ergocalciferol, (DRISDOL) 1.25 MG (50000 UT) CAPS capsule Take 50,000 Units by mouth every Monday.        ROS:  ROS was performed and is negative except as noted in HPI    General Examination:                                                                                                      Weight 106.9 kg.  Physical Exam  Constitutional: Appears well-developed and well-nourished.  Psych: Affect appropriate to situation Eyes: Normal external eye and conjunctiva. HENT: Normocephalic, no lesions, without obvious abnormality.   Musculoskeletal-no joint tenderness, deformity or swelling Cardiovascular: Normal rate and regular rhythm.  Respiratory: Effort normal, non-labored breathing saturations WNL GI: Soft.  No distension. There is no tenderness.  Skin: WDI  Neurological Examination Mental Status: Alert, oriented name/age/month/ said year was 2020, thought content appropriate.  Speech fluent without evidence of aphasia.  Able to  follow commands without difficulty. Repetition intact. Mild dysarthria. Cranial Nerves: II: Visual fields grossly normal,  III,IV, VI: ptosis not present, extra-ocular motions intact bilaterally, pupils equal, round, reactive to light and accommodation V,VII: smile asymmetric, slight left facial droop, facial light touch sensation normal bilaterally VIII: hearing normal bilaterally IX,X: uvula rises midline XI: bilateral shoulder shrug XII: midline tongue extension Motor: Able to raise all 4 extremities anti-gravity. LUE and LLE with drift Tone and bulk:normal tone throughout; no atrophy noted Sensory:light touch intact throughout, bilaterally Cerebellar: No ataxia noted Gait: deferred   Lab Results: Basic Metabolic Panel: Recent Labs  Lab 09/18/19 1236  NA 134*  K 4.3  CL 98  GLUCOSE 170*  BUN 31*  CREATININE 9.20*    CBC: Recent Labs  Lab 09/18/19 1227 09/18/19 1236  WBC 9.6  --   NEUTROABS 7.4  --   HGB 9.5* 10.5*  HCT 33.1* 31.0*  MCV 79.6*  --   PLT 468*  --     CBG: Recent Labs  Lab 09/18/19 1225  GLUCAP 159*    Imaging: CT Code Stroke CTA Head W/WO contrast  Result Date: 09/18/2019 CLINICAL DATA:  Acute presentation with left sided weakness and left facial droop. Slurred speech. EXAM: CT ANGIOGRAPHY HEAD AND NECK TECHNIQUE: Multidetector CT imaging of the head and neck was performed using the standard protocol during bolus administration of intravenous contrast. Multiplanar CT image reconstructions and MIPs were obtained to evaluate the vascular anatomy. Carotid stenosis measurements (when applicable) are obtained utilizing NASCET criteria, using the distal internal carotid diameter as the denominator. CONTRAST:  Not annotated. COMPARISON:  Head CT same day FINDINGS: CTA NECK FINDINGS Aortic arch: Aortic atherosclerosis. Branching pattern is normal without origin stenosis. Right carotid system: Common carotid artery widely patent to the bifurcation. Minimal  atherosclerotic plaque at the carotid bifurcation and ICA bulb but no stenosis. Cervical ICA widely patent. Left carotid system: Common carotid artery widely patent to the bifurcation. Mild atherosclerotic plaque at the ICA bulb. Minimal diameter 3.5 mm. Compared to a more distal cervical ICA diameter of 4 mm, this indicates a 10-20% stenosis. Vertebral arteries: Both vertebral arteries are patent through the cervical region. Right vertebral artery takes an early origin from the proximal subclavian. Skeleton: Ordinary mid cervical spondylosis. Other neck:  No mass or lymphadenopathy. Upper chest: Normal Review of the MIP images confirms the above findings CTA HEAD FINDINGS Anterior circulation: Both internal carotid arteries are patent through the skull base and siphon regions. There is mild atherosclerotic calcification in the carotid siphon regions but no stenosis greater than 30%. The anterior and middle cerebral vessels are patent without proximal stenosis, aneurysm or vascular malformation. No large or medium vessel occlusion. Posterior circulation: Both vertebral arteries widely patent to the basilar. No basilar stenosis. Posterior circulation branch vessels are normal. Patent posterior communicating arteries. Venous sinuses: Normal Anatomic variants: None Review of the MIP images confirms the above findings IMPRESSION: No acute large or medium vessel occlusion. Aortic Atherosclerosis (ICD10-I70.0). Atherosclerotic change at both carotid bifurcations. No stenosis on the right. 10-20% ICA bulb stenosis on the left. Ordinary atherosclerotic change in the carotid siphon regions but without stenosis greater than 30%. Electronically Signed   By: Nelson Chimes M.D.   On: 09/18/2019 12:51   CT Code Stroke CTA Neck W/WO contrast  Result Date: 09/18/2019 CLINICAL DATA:  Acute presentation with left sided weakness and left facial droop. Slurred speech. EXAM: CT ANGIOGRAPHY HEAD AND NECK TECHNIQUE: Multidetector CT  imaging of the head and neck was performed using the standard protocol during bolus administration of intravenous contrast. Multiplanar CT image reconstructions and MIPs were obtained to evaluate the vascular anatomy. Carotid stenosis measurements (when applicable) are obtained utilizing NASCET criteria, using the distal internal carotid diameter as the denominator. CONTRAST:  Not annotated. COMPARISON:  Head CT same day FINDINGS: CTA NECK FINDINGS Aortic arch: Aortic atherosclerosis. Branching pattern is normal without origin stenosis. Right carotid system: Common carotid artery widely patent to the bifurcation. Minimal atherosclerotic plaque at the carotid bifurcation and ICA bulb but no stenosis. Cervical ICA widely patent. Left carotid system: Common carotid artery widely patent to the bifurcation. Mild atherosclerotic plaque at the ICA bulb. Minimal diameter 3.5 mm. Compared to a more distal cervical ICA diameter of 4 mm, this indicates a 10-20% stenosis. Vertebral arteries: Both vertebral arteries are patent through the cervical region. Right vertebral artery takes an early origin from the proximal subclavian. Skeleton: Ordinary mid cervical spondylosis. Other neck: No mass or lymphadenopathy. Upper chest: Normal Review of the MIP images confirms the above findings CTA HEAD FINDINGS Anterior circulation: Both internal carotid arteries are patent through the skull base and siphon regions. There is mild atherosclerotic calcification in the carotid siphon regions but no stenosis greater than 30%. The anterior and middle cerebral vessels are patent without proximal stenosis, aneurysm or vascular malformation. No large or medium vessel occlusion. Posterior circulation: Both vertebral arteries widely patent to the basilar. No basilar stenosis. Posterior circulation branch vessels are normal. Patent posterior communicating arteries. Venous sinuses: Normal Anatomic variants: None Review of the MIP images confirms the  above findings IMPRESSION: No acute large or medium vessel occlusion. Aortic Atherosclerosis (ICD10-I70.0). Atherosclerotic change at both carotid bifurcations. No stenosis on the right. 10-20% ICA bulb stenosis on the left. Ordinary atherosclerotic change in the carotid siphon regions but without stenosis greater than 30%. Electronically Signed   By: Nelson Chimes M.D.   On: 09/18/2019 12:51   CT HEAD CODE STROKE WO CONTRAST  Result Date: 09/18/2019 CLINICAL DATA:  Code stroke. Acute stroke with left-sided weakness and left facial droop. Slurred speech. EXAM: CT HEAD WITHOUT CONTRAST TECHNIQUE: Contiguous axial images were obtained from the base of the skull through the vertex without intravenous contrast. COMPARISON:  01/18/2012. FINDINGS: Brain: Chronic small-vessel ischemic  changes of the hemispheric white matter. 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 No sign of acute infarction, mass lesion, hemorrhage, hydrocephalus or extra-axial collection. Vascular: There is atherosclerotic calcification of the major vessels at the base of the brain. Skull: Negative Sinuses/Orbits: Clear/normal Other: None ASPECTS ([place] Stroke Program Early CT Score) - Ganglionic level infarction (caudate, lentiform nuclei, internal capsule, insula, M1-M3 cortex): 7 - Supraganglionic infarction (M4-M6 cortex): 3 Total score (0-10 with 10 being normal): 10 IMPRESSION: 1. No acute finding. Mild chronic small-vessel change of the hemispheric white matter. 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 2. ASPECTS is 10. 3. These results were communicated to Dr. Leonel Ramsay at 12:38 pmon 5/15/2021by text page via the [facility] messaging system. Electronically Signed   By: Nelson Chimes M.D.   On: 09/18/2019 12:39       Laurey Morale, MSN, NP-C [facility] [phone]  09/18/2019, 12:34 PM   Attending physician note to follow with Assessment and plan .  I have seen the patient reviewed the  above note.  She had abrupt onset left hemiparesis.  Assessment: 60 y.o. female ESRD ( on dialysis MWF), HLD, HTN, DM who presented to [facility] ED as a code stroke for sudden onset left side weakness, slurred speech and facial droop. TPA was offered and patient declined. [facility] was negative for hemorrage, and CTA did not show an LVO there fore she is not a candidate for IR.  Complete stroke work-up needed.   Stroke Risk Factors - diabetes mellitus, hyperlipidemia and hypertension    Recommendations: -- BP goal : Permissive HTN upto 220/110 mmHg (for 24-48 post admission ) --MRI brain --Echocardiogram -- ASA -- High intensity Statin if LDL > 70 -- HgbA1c, fasting lipid panel -- PT consult, OT consult, Speech consult --Telemetry monitoring --Frequent neuro checks; patient remains in the TPA window until 1530. Please notify neurology immediately of any neurological change. --Stroke swallow screen   Roland Rack, MD [facility] [phone]  If 7pm- 7am, please page neurology on call as listed in [place].   --Please page the Stroke team from 8am-4pm.   You can look them up on [email or web address]

## 2019-09-19 ENCOUNTER — Observation Stay (HOSPITAL_COMMUNITY): Payer: BC Managed Care – PPO

## 2019-09-19 DIAGNOSIS — R0989 Other specified symptoms and signs involving the circulatory and respiratory systems: Secondary | ICD-10-CM | POA: Diagnosis not present

## 2019-09-19 DIAGNOSIS — R112 Nausea with vomiting, unspecified: Secondary | ICD-10-CM | POA: Diagnosis not present

## 2019-09-19 DIAGNOSIS — I872 Venous insufficiency (chronic) (peripheral): Secondary | ICD-10-CM | POA: Diagnosis present

## 2019-09-19 DIAGNOSIS — N186 End stage renal disease: Secondary | ICD-10-CM | POA: Diagnosis present

## 2019-09-19 DIAGNOSIS — I6340 Cerebral infarction due to embolism of unspecified cerebral artery: Secondary | ICD-10-CM | POA: Diagnosis not present

## 2019-09-19 DIAGNOSIS — I69354 Hemiplegia and hemiparesis following cerebral infarction affecting left non-dominant side: Secondary | ICD-10-CM | POA: Diagnosis not present

## 2019-09-19 DIAGNOSIS — R34 Anuria and oliguria: Secondary | ICD-10-CM | POA: Diagnosis not present

## 2019-09-19 DIAGNOSIS — K219 Gastro-esophageal reflux disease without esophagitis: Secondary | ICD-10-CM | POA: Diagnosis present

## 2019-09-19 DIAGNOSIS — I69392 Facial weakness following cerebral infarction: Secondary | ICD-10-CM | POA: Diagnosis not present

## 2019-09-19 DIAGNOSIS — I639 Cerebral infarction, unspecified: Secondary | ICD-10-CM | POA: Diagnosis present

## 2019-09-19 DIAGNOSIS — K3184 Gastroparesis: Secondary | ICD-10-CM | POA: Diagnosis present

## 2019-09-19 DIAGNOSIS — E785 Hyperlipidemia, unspecified: Secondary | ICD-10-CM | POA: Diagnosis present

## 2019-09-19 DIAGNOSIS — E1143 Type 2 diabetes mellitus with diabetic autonomic (poly)neuropathy: Secondary | ICD-10-CM | POA: Diagnosis present

## 2019-09-19 DIAGNOSIS — I700 Atherosclerosis of aorta: Secondary | ICD-10-CM | POA: Diagnosis present

## 2019-09-19 DIAGNOSIS — G8194 Hemiplegia, unspecified affecting left nondominant side: Secondary | ICD-10-CM | POA: Diagnosis present

## 2019-09-19 DIAGNOSIS — Z8673 Personal history of transient ischemic attack (TIA), and cerebral infarction without residual deficits: Secondary | ICD-10-CM | POA: Diagnosis not present

## 2019-09-19 DIAGNOSIS — D631 Anemia in chronic kidney disease: Secondary | ICD-10-CM | POA: Diagnosis present

## 2019-09-19 DIAGNOSIS — I69328 Other speech and language deficits following cerebral infarction: Secondary | ICD-10-CM | POA: Diagnosis not present

## 2019-09-19 DIAGNOSIS — N2581 Secondary hyperparathyroidism of renal origin: Secondary | ICD-10-CM | POA: Diagnosis present

## 2019-09-19 DIAGNOSIS — E7800 Pure hypercholesterolemia, unspecified: Secondary | ICD-10-CM | POA: Diagnosis not present

## 2019-09-19 DIAGNOSIS — Z20822 Contact with and (suspected) exposure to covid-19: Secondary | ICD-10-CM | POA: Diagnosis present

## 2019-09-19 DIAGNOSIS — E669 Obesity, unspecified: Secondary | ICD-10-CM | POA: Diagnosis present

## 2019-09-19 DIAGNOSIS — R000 Tachycardia, unspecified: Secondary | ICD-10-CM | POA: Diagnosis not present

## 2019-09-19 DIAGNOSIS — Z992 Dependence on renal dialysis: Secondary | ICD-10-CM

## 2019-09-19 DIAGNOSIS — E1122 Type 2 diabetes mellitus with diabetic chronic kidney disease: Secondary | ICD-10-CM

## 2019-09-19 DIAGNOSIS — E8889 Other specified metabolic disorders: Secondary | ICD-10-CM | POA: Diagnosis present

## 2019-09-19 DIAGNOSIS — E1151 Type 2 diabetes mellitus with diabetic peripheral angiopathy without gangrene: Secondary | ICD-10-CM | POA: Diagnosis present

## 2019-09-19 DIAGNOSIS — K5900 Constipation, unspecified: Secondary | ICD-10-CM | POA: Diagnosis present

## 2019-09-19 DIAGNOSIS — R29810 Facial weakness: Secondary | ICD-10-CM | POA: Diagnosis present

## 2019-09-19 DIAGNOSIS — N390 Urinary tract infection, site not specified: Secondary | ICD-10-CM | POA: Diagnosis not present

## 2019-09-19 DIAGNOSIS — G4733 Obstructive sleep apnea (adult) (pediatric): Secondary | ICD-10-CM | POA: Diagnosis present

## 2019-09-19 DIAGNOSIS — R29705 NIHSS score 5: Secondary | ICD-10-CM | POA: Diagnosis present

## 2019-09-19 DIAGNOSIS — E1159 Type 2 diabetes mellitus with other circulatory complications: Secondary | ICD-10-CM | POA: Diagnosis not present

## 2019-09-19 DIAGNOSIS — E871 Hypo-osmolality and hyponatremia: Secondary | ICD-10-CM | POA: Diagnosis present

## 2019-09-19 DIAGNOSIS — R531 Weakness: Secondary | ICD-10-CM | POA: Diagnosis not present

## 2019-09-19 DIAGNOSIS — E119 Type 2 diabetes mellitus without complications: Secondary | ICD-10-CM | POA: Diagnosis not present

## 2019-09-19 DIAGNOSIS — I722 Aneurysm of renal artery: Secondary | ICD-10-CM | POA: Diagnosis present

## 2019-09-19 DIAGNOSIS — I361 Nonrheumatic tricuspid (valve) insufficiency: Secondary | ICD-10-CM | POA: Diagnosis not present

## 2019-09-19 DIAGNOSIS — I63342 Cerebral infarction due to thrombosis of left cerebellar artery: Secondary | ICD-10-CM | POA: Diagnosis not present

## 2019-09-19 DIAGNOSIS — H5712 Ocular pain, left eye: Secondary | ICD-10-CM | POA: Diagnosis not present

## 2019-09-19 DIAGNOSIS — I10 Essential (primary) hypertension: Secondary | ICD-10-CM | POA: Diagnosis not present

## 2019-09-19 DIAGNOSIS — D72829 Elevated white blood cell count, unspecified: Secondary | ICD-10-CM | POA: Diagnosis not present

## 2019-09-19 DIAGNOSIS — I6381 Other cerebral infarction due to occlusion or stenosis of small artery: Secondary | ICD-10-CM | POA: Diagnosis present

## 2019-09-19 DIAGNOSIS — I120 Hypertensive chronic kidney disease with stage 5 chronic kidney disease or end stage renal disease: Secondary | ICD-10-CM | POA: Diagnosis present

## 2019-09-19 LAB — CBC
HCT: 31.2 % — ABNORMAL LOW (ref 36.0–46.0)
Hemoglobin: 8.9 g/dL — ABNORMAL LOW (ref 12.0–15.0)
MCH: 22.5 pg — ABNORMAL LOW (ref 26.0–34.0)
MCHC: 28.5 g/dL — ABNORMAL LOW (ref 30.0–36.0)
MCV: 78.8 fL — ABNORMAL LOW (ref 80.0–100.0)
Platelets: 503 10*3/uL — ABNORMAL HIGH (ref 150–400)
RBC: 3.96 MIL/uL (ref 3.87–5.11)
RDW: 16.2 % — ABNORMAL HIGH (ref 11.5–15.5)
WBC: 11.1 10*3/uL — ABNORMAL HIGH (ref 4.0–10.5)
nRBC: 0.4 % — ABNORMAL HIGH (ref 0.0–0.2)

## 2019-09-19 LAB — LIPID PANEL
Cholesterol: 195 mg/dL (ref 0–200)
HDL: 47 mg/dL (ref >40)
LDL Cholesterol: 127 mg/dL — ABNORMAL HIGH (ref 0–99)
Total CHOL/HDL Ratio: 4.1 RATIO
Triglycerides: 107 mg/dL (ref <150)
VLDL: 21 mg/dL (ref 0–40)

## 2019-09-19 LAB — HEMOGLOBIN A1C
Hgb A1c MFr Bld: 6.7 % — ABNORMAL HIGH (ref 4.8–5.6)
Mean Plasma Glucose: 145.59 mg/dL

## 2019-09-19 LAB — BASIC METABOLIC PANEL
Anion gap: 14 (ref 5–15)
BUN: 38 mg/dL — ABNORMAL HIGH (ref 6–20)
CO2: 27 mmol/L (ref 22–32)
Calcium: 9.9 mg/dL (ref 8.9–10.3)
Chloride: 96 mmol/L — ABNORMAL LOW (ref 98–111)
Creatinine, Ser: 9.52 mg/dL — ABNORMAL HIGH (ref 0.44–1.00)
GFR calc Af Amer: 5 mL/min — ABNORMAL LOW (ref >60)
GFR calc non Af Amer: 4 mL/min — ABNORMAL LOW (ref >60)
Glucose, Bld: 83 mg/dL (ref 70–99)
Potassium: 4.1 mmol/L (ref 3.5–5.1)
Sodium: 137 mmol/L (ref 135–145)

## 2019-09-19 LAB — ECHOCARDIOGRAM COMPLETE
Height: 67 in
Weight: 3770.75 oz

## 2019-09-19 IMAGING — MR MR HEAD W/O CM
10 of 11 series · 43 of 48 positions shown · non-contrast
Comparison: CT studies [DATE]

CLINICAL DATA: Left arm and leg weakness with facial droop. Acute
presentation yesterday. Negative CT evaluation.

EXAM:
MRI HEAD WITHOUT CONTRAST
TECHNIQUE: Multiplanar, multiecho pulse sequences of the brain and surrounding
structures were obtained without intravenous contrast.

[Series 5: DWI · axial · 3.0mm · 0.88mm/px · z∈[-125,+24]mm · 10 of 102 slices shown (1 of 4)]
[im 1/102]
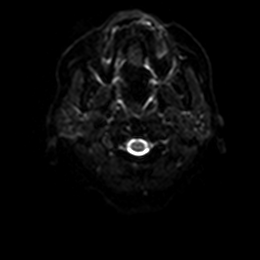
[im 12/102]
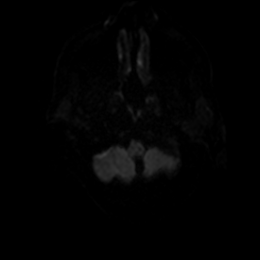
[im 23/102]
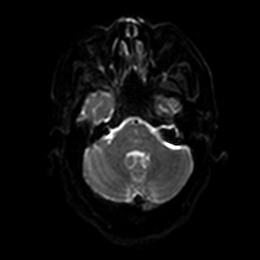
[im 34/102]
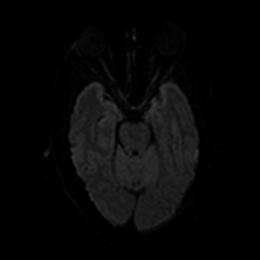
[im 45/102]
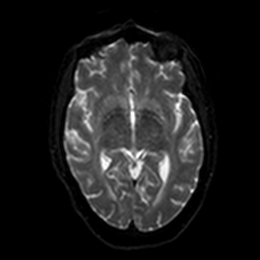
[im 57/102]
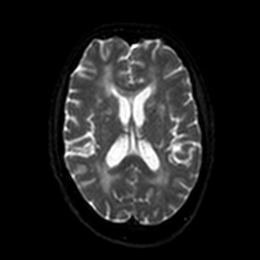
[im 68/102]
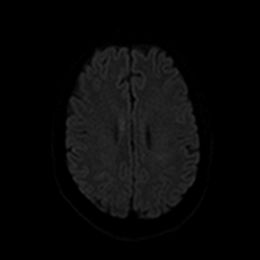
[im 79/102]
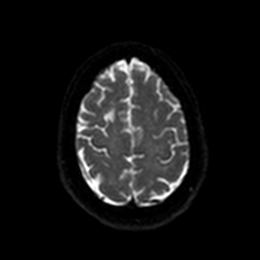
[im 90/102]
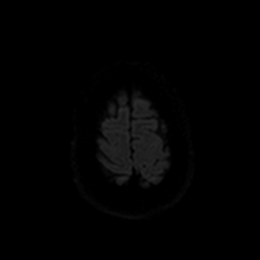
[im 102/102]
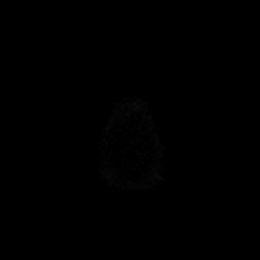

[Series 6: DWI · axial · 3.0mm · 0.88mm/px · z∈[-125,+24]mm · 5 of 51 slices shown (2 of 4)]
[im 1/51]
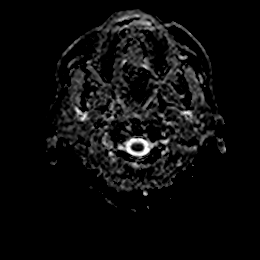
[im 13/51]
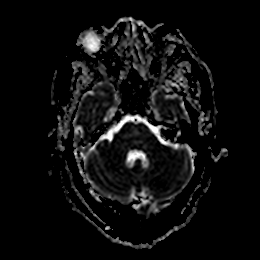
[im 26/51]
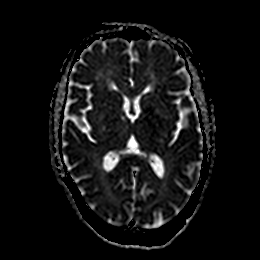
[im 38/51]
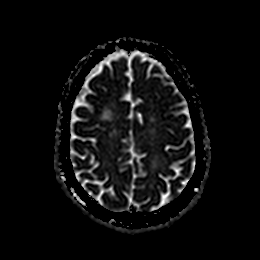
[im 51/51]
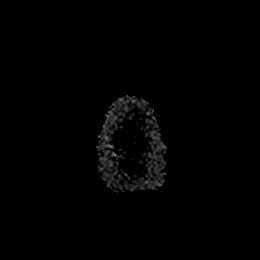

[Series 7: DWI · coronal · 4.0mm · 0.88mm/px · 7 of 72 slices shown (3 of 4)]
[im 1/72]
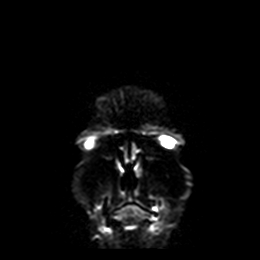
[im 12/72]
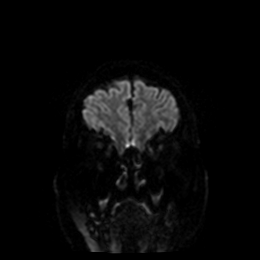
[im 24/72]
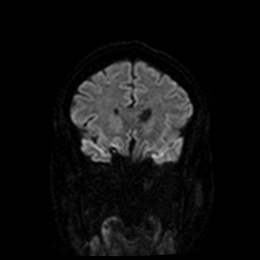
[im 36/72]
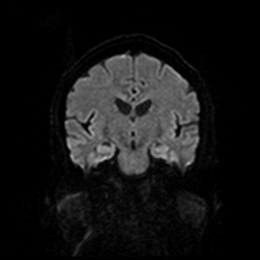
[im 48/72]
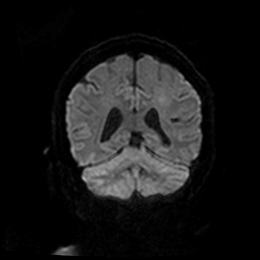
[im 60/72]
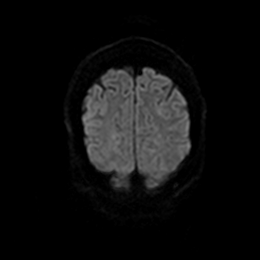
[im 72/72]
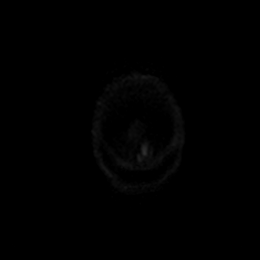

[Series 8: DWI · coronal · 4.0mm · 0.88mm/px · 3 of 36 slices shown (4 of 4)]
[im 1/36]
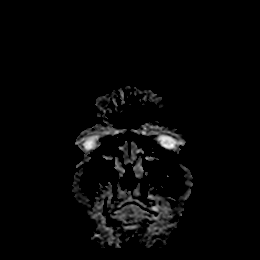
[im 18/36]
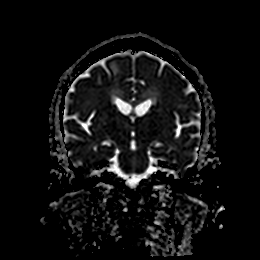
[im 36/36]
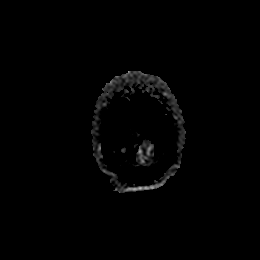

[Series 9: T1 · sagittal · 5.0mm · 0.75mm/px · 2 of 23 slices shown]
[im 1/23]
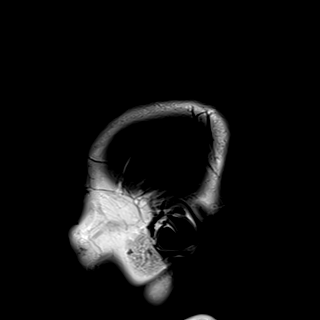
[im 23/23]
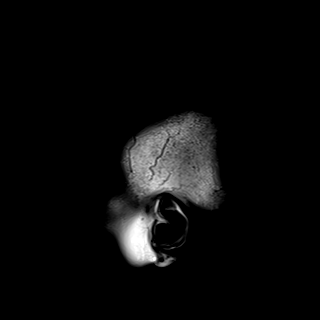

[Series 10: T2 · axial · 5.0mm · 0.72mm/px · z∈[-122,+21]mm · 2 of 25 slices shown (1 of 2)]
[im 1/25]
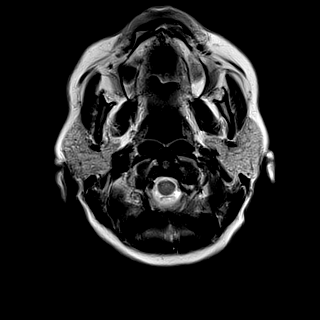
[im 25/25]
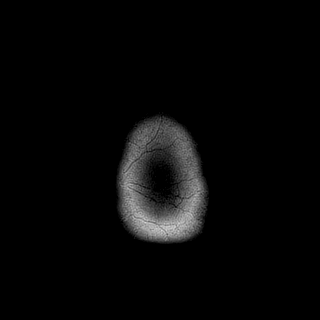

[Series 11: FLAIR · axial · 5.0mm · 0.45mm/px · z∈[-120,+23]mm · 2 of 25 slices shown]
[im 1/25]
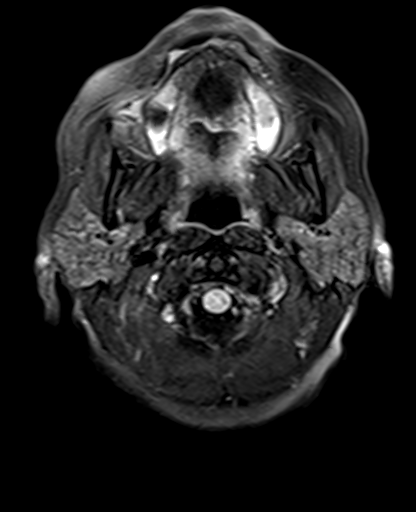
[im 25/25]
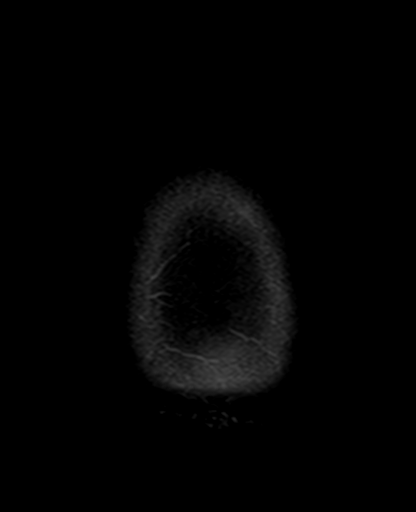

[Series 14: swi_images · axial · 3.0mm · 0.90mm/px · z∈[-131,+34]mm · 5 of 56 slices shown]
[im 1/56]
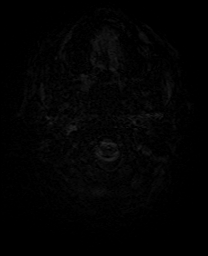
[im 14/56]
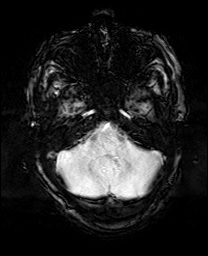
[im 28/56]
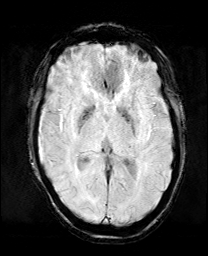
[im 42/56]
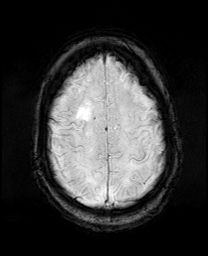
[im 56/56]
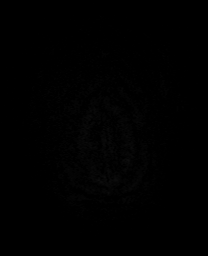

[Series 15: mip_images(sw) · axial · 24.0mm · 0.90mm/px · z∈[-120,+23]mm · 4 of 49 slices shown]
[im 1/49]
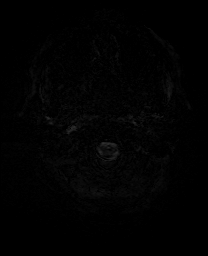
[im 17/49]
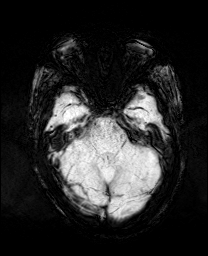
[im 33/49]
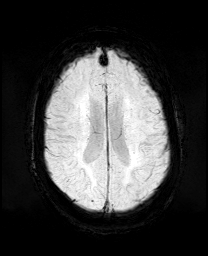
[im 49/49]
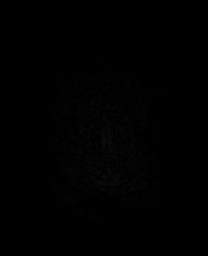

[Series 17: T2 · coronal · 5.0mm · 0.34mm/px · 3 of 29 slices shown (2 of 2)]
[im 1/29]
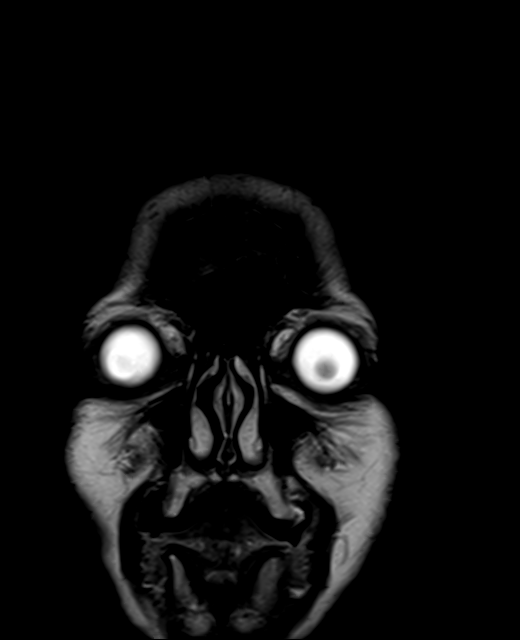
[im 15/29]
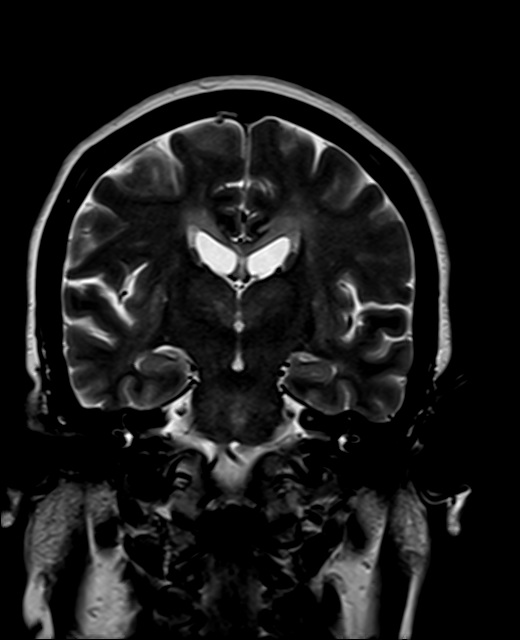
[im 29/29]
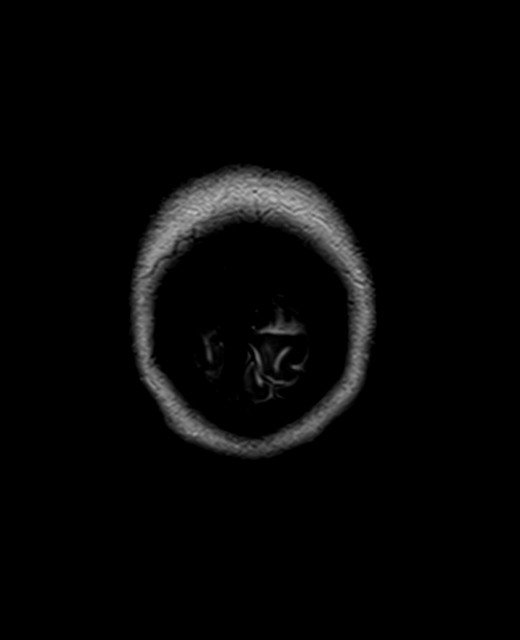

[43 of 48 positions shown; findings below may reference images not displayed]

FINDINGS: Brain: Diffusion imaging shows a 1 cm acute to subacute infarction
in the left cerebellum. Chronic small-vessel ischemic changes are
present throughout the pons. Cerebral hemispheres show a
subcentimeter acute infarction in the white matter posterior to the
atrium of the right lateral ventricle. Chronic small-vessel ischemic
changes are seen extensively throughout the cerebral hemispheric
deep and subcortical white matter and basal ganglia. There is an old
right frontal cortical and subcortical infarction. No mass lesion,
hemorrhage, hydrocephalus or extra-axial collection. One could
consider the possibility of coexistence demyelinating disease, but
in a patient with diabetes, hypertension and hyperlipidemia,
findings are presumed to represent small vessel disease.

Vascular: Major vessels at the base of the brain show flow.

Skull and upper cervical spine: Negative

Sinuses/Orbits: Clear/normal

Other: None
IMPRESSION: Background pattern of extensive chronic small-vessel ischemic change
throughout the brain as outlined above. Old right frontal cortical
and subcortical infarction.

1 cm acute infarction in the left cerebellum. Subcentimeter acute
infarction in the deep white matter just posterior to the atrium of
the right lateral ventricle. Neither is associated with hemorrhage
or mass effect.

## 2019-09-19 MED ORDER — ATORVASTATIN CALCIUM 40 MG PO TABS
40.0000 mg | ORAL_TABLET | Freq: Every day | ORAL | Status: DC
  Administered 2019-09-19 – 2019-09-23 (×5): 40 mg via ORAL
  Filled 2019-09-19 (×5): qty 1

## 2019-09-19 MED ORDER — ASPIRIN EC 81 MG PO TBEC
81.0000 mg | DELAYED_RELEASE_TABLET | Freq: Every day | ORAL | Status: DC
  Administered 2019-09-20 – 2019-09-23 (×4): 81 mg via ORAL
  Filled 2019-09-19 (×4): qty 1

## 2019-09-19 MED ORDER — ONDANSETRON 4 MG PO TBDP
4.0000 mg | ORAL_TABLET | Freq: Two times a day (BID) | ORAL | Status: DC
  Administered 2019-09-19 – 2019-09-23 (×8): 4 mg via ORAL
  Filled 2019-09-19 (×8): qty 1

## 2019-09-19 MED ORDER — CLOPIDOGREL BISULFATE 75 MG PO TABS
75.0000 mg | ORAL_TABLET | Freq: Every day | ORAL | Status: DC
  Administered 2019-09-19 – 2019-09-23 (×5): 75 mg via ORAL
  Filled 2019-09-19 (×5): qty 1

## 2019-09-19 MED ORDER — CHLORHEXIDINE GLUCONATE CLOTH 2 % EX PADS
6.0000 | MEDICATED_PAD | Freq: Every day | CUTANEOUS | Status: DC
  Administered 2019-09-19 – 2019-09-23 (×4): 6 via TOPICAL

## 2019-09-19 MED ORDER — FERRIC CITRATE 1 GM 210 MG(FE) PO TABS
420.0000 mg | ORAL_TABLET | Freq: Three times a day (TID) | ORAL | Status: DC
  Administered 2019-09-19 – 2019-09-23 (×9): 420 mg via ORAL
  Filled 2019-09-19 (×15): qty 2

## 2019-09-19 NOTE — Progress Notes (Addendum)
Family Medicine Teaching Service Daily Progress Note Intern Pager: [phone]  Patient name: [name] Medical record number: [id number] Date of birth: Apr 19, 1961 Age: 59 y.o. Gender: female  Primary Care Provider: Matilde Haymaker, MD Consultants: Neurology, Nephrology Code Status: Full  Pt Overview and Major Events to Date:  Admitted 5/15  Assessment and Plan: [name] is a 59 y.o. female presenting with left-sided weakness and facial droop. PMH is significant for HTN, ESRD, DMT2.   Left-sided weakness 2/2 CVA vs TIA Presented with left-sided weakness in arm, leg, facial droop, and slurred speech. By the time EMS arrived, slurred speech had resolved, but left-sided facial droop and weakness had not. CT head showed no hemorrhage, CTA showed no LVO or significant stenosis. On exam, patient has weakness on the LUE of 4/5 and LLE 5/5, which is greatly improved. However she can now lift both LUE and LLE, an improvement from this morning.  -Neurology consulted, appreciate recommendations -f/u MR brain, ordered this am -f/u Echocardiogram -BP goal: permissive HTN up to 220/110 for 24-48 hours -ASA 325 -PT/OT/SLP evals -Continuous cardiac monitoring -Frequent neuro checks q4h x24h   ESRD Secondary hyperparathyroidism  Anemia CKD   Patient is on HD, MWF schedule. Last session yesterday, will not require another HD session until Monday. Left arm cephalic vein fistula with thrill, started using in January of this year. Patient does not appear fluid overloaded: clear lungs on auscultation, only trace LE edema. - Continue MWF schedule if patient still admitted on Monday  - consult nephrology for HD needs 5/16 AM    HTN Patient is not currently taking any HTN medication, nor coreg. Permissive HTN in place for the next 24-48 hours. Will continue to monitor. -Permissive HTN for next 24-48 hours -If BP >220/110 can consider using IV labetalol    HLD Lipid panel with LDL 127, HDL 47, tot chol  195. Patient has previously been on statin, last prescribed atorvastatin starting in 2017. LDL goal <70 due to CVA. Patient not on statin at home, possibly due to limited evidence of statin benefit dialysis patients. -re-initiate atorvastatin   DMT2 Last Hgb A1c in July 2020 was 5.1%, A1c now at 6.7. -monitor on daily BMP   Gastroparesis Patient has cyclical nausea and vomiting, admitted for this in December 2020. Thought to be related to gastroparesis. Follows with Dr. Hilarie Brown, [name], last seen in February 2021. Patient had prescriptions for both reglan and zofran.  - Monitor for nausea/vomiting  FEN/Brown: Renal/carb modified Prophylaxis: Heparin  Disposition: pending neurology clearance  Subjective:  Patient reports feeling better this am, symptoms have almost completely resolved. She reports some soreness in left side from overnight. She reports she does not know why her statin was stopped but she had no intolerance before.   Objective: Temp:  [98.3 F (36.8 C)-99.1 F (37.3 C)] 98.3 F (36.8 C) (05/16 0546) Pulse Rate:  [84-107] 89 (05/16 0546) Resp:  [16-24] 18 (05/16 0022) BP: (116-148)/(56-81) 119/66 (05/16 0546) SpO2:  [99 %-100 %] 100 % (05/16 0546) Weight:  [106.9 kg] 106.9 kg (05/15 1301) Physical Exam: General: NAD, pleasant Cardiovascular: RRR, no m/r/g, no LE edema Respiratory: CTA BL, normal work of breathing MSK: moves 4 extremities equally Derm: no rashes appreciated Neuro: CN II-XII grossly intact, strength 4/5 in LUE and 5/5 in LLE Psych: AOx3, appropriate affect  Laboratory: Recent Labs  Lab 09/18/19 1227 09/18/19 1236 09/19/19 0359  WBC 9.6  --  11.1*  HGB 9.5* 10.5* 8.9*  HCT 33.1*  31.0* 31.2*  PLT 468*  --  503*   Recent Labs  Lab 09/18/19 1227 09/18/19 1236 09/19/19 0359  NA 135 134* 137  K 4.2 4.3 4.1  CL 93* 98 96*  CO2 24  --  27  BUN 29* 31* 38*  CREATININE 7.97* 9.20* 9.52*  CALCIUM 9.9  --  9.9  PROT 7.4  --   --   BILITOT  0.7  --   --   ALKPHOS 78  --   --   ALT 9  --   --   AST 10*  --   --   GLUCOSE 173* 170* 83    Imaging/Diagnostic Tests: CT Code Stroke CTA Head W/WO contrast CT Code Stroke CTA Neck W/WO contrast Result Date: 09/18/2019 IMPRESSION: No acute large or medium vessel occlusion. Aortic Atherosclerosis (ICD10-I70.0). Atherosclerotic change at both carotid bifurcations. No stenosis on the right. 10-20% ICA bulb stenosis on the left. Ordinary atherosclerotic change in the carotid siphon regions but without stenosis greater than 30%. Electronically Signed   By: [name] M.D.   On: 09/18/2019 12:51   CT HEAD CODE STROKE WO CONTRAST Result Date: 09/18/2019 IMPRESSION: 1. No acute finding. Mild chronic small-vessel change of the hemispheric white matter. 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 2. ASPECTS is 10. 3. These results were communicated to [name] at 12:38 pmon 5/15/2021by text page via the [facility] messaging system. Electronically Signed   By: [name] M.D.   On: 09/18/2019 12:39    Breshae Belcher, [name], DO 09/19/2019, 6:06 AM PGY-3, [place] Intern pager: [phone], text pages welcome

## 2019-09-19 NOTE — Evaluation (Signed)
Speech Language Pathology Evaluation Patient Details Name: [name] MRN: [id number] DOB: 1960/08/10 Today's Date: 09/19/2019 Time: 4315-4008 SLP Time Calculation (min) (ACUTE ONLY): 20 min  Problem List:  Patient Active Problem List   Diagnosis Date Noted  . Stroke ([place]) 09/18/2019  . Intractable nausea and vomiting 04/20/2019  . Atypical chest pain 04/02/2019  . Type 2 diabetes mellitus without complications ([place]) 67/61/9509  . Anemia in chronic kidney disease 02/19/2019  . ESRD (end stage renal disease) ([place]) 02/19/2019  . Secondary hyperparathyroidism of renal origin ([place]) 02/19/2019  . Coagulation defect, unspecified ([place]) 02/19/2019  . Venous (peripheral) insufficiency 08/06/2017  . Encounter for screening for respiratory tuberculosis 01/01/2016  . Hypertension   . 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 Diabetic gastroparesis ([place]) 08/09/2015  . Low back pain 06/09/2014  . Nausea and vomiting 01/18/2012  . Diarrhea, unspecified 01/18/2012  . Obstructive sleep apnea of adult 01/16/2012  . GERD (gastroesophageal reflux disease) 01/16/2012  . Osteoarthritis of both knees 01/16/2012  . Hyperlipidemia, unspecified 07/18/2011  . LEIOMYOMA, UTERUS 01/14/2007  . Morbid obesity ([place]) 07/03/2006  . Former smoker 07/03/2006  . Tension headache 07/03/2006   Past Medical History:  Past Medical History:  Diagnosis Date  . Accelerated hypertension   . Aortic atherosclerosis ([place])   . Arthritis of knee    bilateral  . Diabetes mellitus    type 2 - no meds  . Diabetic gastroparesis ([place])   . Diverticulosis   . ESRD (end stage renal disease) ([place])   . Fibroid uterus   . GERD (gastroesophageal reflux disease)   . Hyperlipidemia   . Hypertension   . HYPERTENSION, BENIGN SYSTEMIC 07/03/2006        . Hypertensive emergency 02/05/2019  . Hypokalemia   . IDA (iron deficiency anemia)   . Nausea   . Renal disorder   . Umbilical hernia   . Wears dentures     Past Surgical History:  Past Surgical History:  Procedure Laterality Date  . AV FISTULA PLACEMENT Left 02/17/2019   Procedure: ARTERIOVENOUS (AV) FISTULA CREATION LEFT UPPER ARM;  Surgeon: Waynetta Sandy, MD;  Location: [facility];  Service: Vascular;  Laterality: Left;  . CATARACT EXTRACTION     right eye  . CESAREAN SECTION     x2  . CHOLECYSTECTOMY     laparoscopic  . FISTULA SUPERFICIALIZATION Left 04/06/2019   Procedure: FISTULA SUPERFICIALIZATION LEFT BRACHIOCEPHALIC;  Surgeon: Waynetta Sandy, MD;  Location: [facility];  Service: Vascular;  Laterality: Left;  Transposition left arm brachiocephalic fistula.  . IR FLUORO GUIDE CV LINE RIGHT  02/09/2019  . IR FLUORO GUIDE CV LINE RIGHT  03/05/2019  . IR US GUIDE VASC ACCESS RIGHT  02/09/2019  . MULTIPLE TOOTH EXTRACTIONS    . REDUCTION MAMMAPLASTY Bilateral    HPI:  Pt is a 59 y.o. female who presented with left-sided weakness, slurred speech, and facial droop. PMH is significant for HTN, ESRD, DMT2. MRI brain: 1 cm acute infarction in the left cerebellum. Subcentimeter acute infarction in the deep white matter just posterior to the atrium of the right lateral ventricle.   Assessment / Plan / Recommendation Clinical Impression  Pt participated in speech/language/cognition evaluation. 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 She described her speech as "slurry; and stated that it was approximately 80% back to baseline. She initially  denied any acute changes in language or cognition but subsequently stated that she was "real slow" and having difficulty thinking compared to her baseline. [facility] Mental Status Examination was completed to evaluate the pt's cognitive-linguistic skills. She achieved a score of 11/30 which is below the normal limits of 27 or more out of 30. She exhibited difficulty in the areas of awareness,  attention, memory, executive function, and complex problem solving. She also presented with mild dysarthria characterized by reduced articulatory precision which occasionally negatively impacted speech intelligibility at the conversational level. Skilled SLP services are clinically indicated at this time to improve cognitive-linguistic function.    SLP Assessment  SLP Recommendation/Assessment: Patient needs continued Speech Lanaguage Pathology Services SLP Visit Diagnosis: Dysarthria and anarthria (R47.1);Cognitive communication deficit (R41.841)    Follow Up Recommendations  Inpatient Rehab    Frequency and Duration min 2x/week  2 weeks      SLP Evaluation Cognition  Overall Cognitive Status: Impaired/Different from baseline Arousal/Alertness: Awake/alert Orientation Level: Oriented X4 Attention: Focused;Sustained Focused Attention: Impaired Focused Attention Impairment: Verbal complex(Backward digit span: 1/2) Sustained Attention: Impaired Sustained Attention Impairment: Verbal complex Memory: Impaired Memory Impairment: Storage deficit;Decreased recall of new information;Retrieval deficit(Immediate: 3/5; delayed: 0/5 with cues: 1/5) Awareness: Impaired Awareness Impairment: Emergent impairment Problem Solving: Impaired Problem Solving Impairment: Verbal complex Executive Function: Sequencing Sequencing: Impaired Sequencing Impairment: Verbal complex(Clock drawing: 2/4)       Comprehension  Auditory Comprehension Overall Auditory Comprehension: Appears within functional limits for tasks assessed Yes/No Questions: Within Functional Limits Basic Immediate Environment Questions: (5/5) Complex Questions: (5/5) Commands: Within Functional Limits Two Step Basic Commands: (4/4) Multistep Basic Commands: (3/3) Conversation: Complex Visual Recognition/Discrimination Discrimination: Within Function Limits    Expression Expression Primary Mode of Expression: Verbal Verbal  Expression Overall Verbal Expression: Appears within functional limits for tasks assessed Initiation: No impairment Automatic Speech: Counting;Month of year;Day of week(WNL) Level of Generative/Spontaneous Verbalization: Conversation Repetition: No impairment(5/5) Naming: No impairment Pragmatics: No impairment Written Expression Dominant Hand: Right Written Expression: Within Functional Limits   Oral / Motor  Oral Motor/Sensory Function Overall Oral Motor/Sensory Function: Mild impairment Facial ROM: Within Functional Limits Facial Symmetry: Within Functional Limits Facial Strength: Within Functional Limits Facial Sensation: Within Functional Limits Lingual ROM: Within Functional Limits Lingual Symmetry: Within Functional Limits Lingual Strength: Reduced;Suspected CN XII (hypoglossal) dysfunction Lingual Sensation: Within Functional Limits Velum: Within Functional Limits Mandible: Within Functional Limits Motor Speech Overall Motor Speech: Impaired Respiration: Within functional limits Phonation: Low vocal intensity Resonance: Within functional limits Articulation: Impaired Level of Impairment: Conversation Intelligibility: Intelligibility reduced Word: 75-100% accurate Phrase: 75-100% accurate Sentence: 75-100% accurate Conversation: 75-100% accurate Motor Planning: Witnin functional limits Motor Speech Errors: Aware;Consistent   Amberrose Friebel I. Hardin Negus, [place], [place] Office number [phone] Pager [phone]                     Horton Marshall 09/19/2019, 1:20 PM

## 2019-09-19 NOTE — Progress Notes (Signed)
PT Cancellation Note  Patient Details Name: [name] MRN: [id number] DOB: December 09, 1960   Cancelled Treatment:    Reason Eval/Treat Not Completed: Patient at procedure or test/unavailable  Undergoing test. Will return today ASAP   [name], PT Pager [phone]   [name] 09/19/2019, 9:02 AM

## 2019-09-19 NOTE — Progress Notes (Signed)
  Speech Language Pathology Treatment: Cognitive-Linquistic  Patient Details Name: [name] MRN: [id number] DOB: [birth year] Today's Date: 09/19/2019 Time: 8099-8338 SLP Time Calculation (min) (ACUTE ONLY): 21 min  Assessment / Plan / Recommendation Clinical Impression  Pt was seen for treatment and was cooperative throughout the session. She was educated regarding the nature of dysarthria, and compensatory strategies to improve speech intelligibility. She verbalized understanding regarding all areas of education. Pt used compensatory strategies at the phrase level with 60% accuracy increasing to 100% accuracy with cues for overarticulation. At the 5-7 word sentence level she demonstrated 70% accuracy increasing to 100% accuracy with cues for overarticulation. She completed an executive function prescription task with 75% accuracy increasing to 100% with verbal prompts. SLP will continue to follow pt.    HPI HPI: Pt is a 59 y.o. female who presented with left-sided weakness, slurred speech, and facial droop. PMH is significant for HTN, ESRD, DMT2. MRI brain: 1 cm acute infarction in the left cerebellum. Subcentimeter acute infarction in the deep white matter just posterior to the atrium of the right lateral ventricle.      SLP Plan  Continue with current plan of care  Patient needs continued Speech Lanaguage Pathology Services    Recommendations                   Follow up Recommendations: Inpatient Rehab SLP Visit Diagnosis: Dysarthria and anarthria (R47.1);Cognitive communication deficit (R41.841) Plan: Continue with current plan of care       Terel Bann I. Hardin Negus, [place], [place] Office number [phone] Pager [place] 09/19/2019, 1:43 PM

## 2019-09-19 NOTE — Progress Notes (Signed)
Inpatient Rehab Admissions Coordinator Note:   Per therapy recommendations, pt was screened for CIR candidacy by Clemens Catholic, [place] CCC-SLP. At this time, Pt. Appears to have functional decline and is a good candidate for CIR. Requested inpatient rehab consult order.  Please contact me with questions.   Clemens Catholic, [place], [place] Admissions Coordinator  [phone] ([place]) [phone] (office)

## 2019-09-19 NOTE — Progress Notes (Signed)
Patient arrived to unit A/Ox 4. Stable condition. Telemetry verified. Bed left in lowest position, call bell within reach, alarm on. Will continue to monitor. Gwendolyn Grant, RN

## 2019-09-19 NOTE — Progress Notes (Signed)
STROKE TEAM PROGRESS NOTE   INTERVAL HISTORY No family is at the bedside.  Pt sitting in chair, stated that she still feel left UE and LE heavy, not much change from yesterday. She felt her speech is better than yesterday though. She denies any heart palpitation or racing heart. She takes ASA at home. She has HD MWF and sometimes BP drops to 90s during HD.   OBJECTIVE Vitals:   09/18/19 2200 09/19/19 0022 09/19/19 0546 09/19/19 1202  BP: 120/70 116/66 119/66 (!) 111/55  Pulse: (!) 107 100 89 96  Resp: 17 18  18   Temp:  98.9 F (37.2 C) 98.3 F (36.8 C) 98 F (36.7 C)  TempSrc:  Oral Oral Oral  SpO2: 100% 100% 100% 100%  Weight:      Height:        CBC:  Recent Labs  Lab 09/18/19 1227 09/18/19 1227 09/18/19 1236 09/19/19 0359  WBC 9.6  --   --  11.1*  NEUTROABS 7.4  --   --   --   HGB 9.5*   < > 10.5* 8.9*  HCT 33.1*   < > 31.0* 31.2*  MCV 79.6*  --   --  78.8*  PLT 468*  --   --  503*   < > = values in this interval not displayed.    Basic Metabolic Panel:  Recent Labs  Lab 09/18/19 1227 09/18/19 1227 09/18/19 1236 09/19/19 0359  NA 135   < > 134* 137  K 4.2   < > 4.3 4.1  CL 93*   < > 98 96*  CO2 24  --   --  27  GLUCOSE 173*   < > 170* 83  BUN 29*   < > 31* 38*  CREATININE 7.97*   < > 9.20* 9.52*  CALCIUM 9.9  --   --  9.9   < > = values in this interval not displayed.    Lipid Panel:     Component Value Date/Time   CHOL 195 09/19/2019 0359   CHOL 181 11/12/2018 0949   TRIG 107 09/19/2019 0359   HDL 47 09/19/2019 0359   HDL 55 11/12/2018 0949   CHOLHDL 4.1 09/19/2019 0359   VLDL 21 09/19/2019 0359   LDLCALC 127 (H) 09/19/2019 0359   LDLCALC 107 (H) 11/12/2018 0949   HgbA1c:  Lab Results  Component Value Date   HGBA1C 6.7 (H) 09/19/2019   Urine Drug Screen:     Component Value Date/Time   LABOPIA NONE DETECTED 09/26/2008 1449   COCAINSCRNUR NONE DETECTED 09/26/2008 1449   LABBENZ NONE DETECTED 09/26/2008 1449   AMPHETMU NONE DETECTED  09/26/2008 1449   THCU NONE DETECTED 09/26/2008 1449   LABBARB  09/26/2008 1449    NONE DETECTED        DRUG SCREEN FOR MEDICAL PURPOSES ONLY.  IF CONFIRMATION IS NEEDED FOR ANY PURPOSE, NOTIFY LAB WITHIN 5 DAYS.        LOWEST DETECTABLE LIMITS FOR URINE DRUG SCREEN Drug Class       Cutoff (ng/mL) Amphetamine      1000 Barbiturate      200 Benzodiazepine   811 Tricyclics       914 Opiates          300 Cocaine          300 THC              50    Alcohol Level No results found for: [facility]  IMAGING  CT Code Stroke CTA Head W/WO contrast CT Code Stroke CTA Neck W/WO contrast 09/18/2019 IMPRESSION:  No acute large or medium vessel occlusion. Aortic Atherosclerosis (ICD10-I70.0). Atherosclerotic change at both carotid bifurcations. No stenosis on the right. 10-20% ICA bulb stenosis on the left. Ordinary atherosclerotic change in the carotid siphon regions but without stenosis greater than 30%.   MR BRAIN WO CONTRAST 09/19/2019 IMPRESSION:  Background pattern of extensive chronic small-vessel ischemic change throughout the brain as outlined above. 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 1 cm acute infarction in the left cerebellum. Subcentimeter acute infarction in the deep white matter just posterior to the atrium of the right lateral ventricle. Neither is associated with hemorrhage or mass effect.   CT HEAD CODE STROKE WO CONTRAST 09/18/2019 IMPRESSION:  1. No acute finding. Mild chronic small-vessel change of the hemispheric white matter. 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  2. ASPECTS is 10.   ECHOCARDIOGRAM COMPLETE 09/19/2019 IMPRESSIONS   1. Left ventricular ejection fraction, by estimation, is 55 to 60%. The left ventricle has normal function. The left ventricle has no regional wall motion abnormalities. There is moderate asymmetric left ventricular hypertrophy of the basal and septal segments. Left ventricular diastolic parameters are  consistent with Grade I diastolic dysfunction (impaired relaxation).   2. Right ventricular systolic function is normal. The right ventricular size is normal. There is normal pulmonary artery systolic pressure.   3. The mitral valve is normal in structure. No evidence of mitral valve regurgitation. No evidence of mitral stenosis.   4. The aortic valve is tricuspid. Aortic valve regurgitation is not visualized. Mild to moderate aortic valve sclerosis/calcification is present, without any evidence of aortic stenosis.   5. The inferior vena cava is normal in size with greater than 50% respiratory variability, suggesting right atrial pressure of 3 mmHg.   PHYSICAL EXAM  Temp:  [98 F (36.7 C)-99.1 F (37.3 C)] 98 F (36.7 C) (05/16 1202) Pulse Rate:  [85-107] 96 (05/16 1202) Resp:  [16-24] 18 (05/16 1202) BP: (111-148)/(55-76) 111/55 (05/16 1202) SpO2:  [99 %-100 %] 100 % (05/16 1202)  General - Well nourished, well developed, in no apparent distress.  Ophthalmologic - fundi not visualized due to noncooperation.  Cardiovascular - Regular rhythm and rate.  Mental Status -  Level of arousal and orientation to time, place, and person were intact. Language including expression, naming, repetition, comprehension was assessed and found intact. Mild psychomotor slowing. Slow speech, but no dysarthria.   Cranial Nerves II - XII - II - Visual field intact OU. III, IV, VI - Extraocular movements intact. V - Facial sensation intact bilaterally. VII - left facial droop. VIII - Hearing & vestibular intact bilaterally. X - Palate elevates symmetrically. XI - Chin turning & shoulder shrug intact bilaterally. XII - Tongue protrusion intact.  Motor Strength - The patient's strength was normal in RUE and RLE, however, LUE 4/5 proximal and distal with decreased dexterity difficulty. LLE 4/5 proximal but 5/5 distal.  Bulk was normal and fasciculations were absent.   Motor Tone - Muscle tone was  assessed at the neck and appendages and was normal.  Reflexes - The patient's reflexes were symmetrical in all extremities and she had no pathological reflexes.  Sensory - Light touch, temperature/pinprick were assessed and were symmetrical.    Coordination - The patient had normal movements in the hands with no ataxia or dysmetria, but slow action especially on the left.  Tremor was absent.  Gait and  Station - deferred.     ASSESSMENT/PLAN Ms. [name] is a 59 y.o. female with history of ESRD (recent clots at dialysis) (dialysis MWF), HLD, HTN, DM who presented to [facility] ED as a code stroke for sudden onset left side weakness, slurred speech and facial droop.  She did not receive IV t-PA due to pt declined.  Stroke: 2 small infarcts at left cerebellar deep nucleus and posterior to the atrium of the right lateral ventricle - most likely separate small vessel disease.  Code Stroke CT Head - No acute finding. 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  ASPECTS is 10.   MRI head - Old right frontal cortical and subcortical infarction. 1 cm acute infarction in the left cerebellum. Subcentimeter acute infarction in the deep white matter just posterior to the atrium of the right lateral ventricle.   CTA H&N - No acute large or medium vessel occlusion. Aortic Atherosclerosis (ICD10-I70.0). Atherosclerotic change at both carotid bifurcations, siphon regions but without significant stenosis  2D Echo - EF  55 to 60%. No cardiac source of emboli identified.   Given 2 separate strokes, will recommend 30 day cardiac event monitoring as outpt to rule out afib  [employer] Virus 2 - negative  LDL - 127  HgbA1c - 6.7  VTE prophylaxis - [place] Heparin  aspirin 325 mg daily prior to admission, now on aspirin 81 mg daily and clopidogrel 75 mg daily DAPT for 3 weeks and then plavix alone.   Patient counseled to be compliant with her antithrombotic medications  Ongoing aggressive stroke  risk factor management  Therapy recommendations:  pending  Disposition:  Pending  Hypertension  Home BP meds: none   Current BP meds: none  BP mildly low . Permissive hypertension (OK if < 220/120) but gradually normalize in 5-7 days  . Long-term BP goal normotensive  Hyperlipidemia  Home Lipid lowering medication: none   LDL 127, goal < 70  Current lipid lowering medication: Lipitor 40 mg daily   Continue statin at discharge  Diabetes  Home diabetic meds: none  Current diabetic meds: none  HgbA1c 6.7, goal < 7.0  SSI  CBG monitoring  PCP follow up  Other Stroke Risk Factors  Advanced age  Former cigarette smoker - quit  Obesity, Body mass index is 36.91 kg/m., recommend weight loss, diet and exercise as appropriate   Hx stroke/TIA - by imaging - Old right frontal cortical and subcortical infarction.    Other Active Problems  Code status - Full code  Aortic Atherosclerosis (ICD10-I70.0).  ESRD on HD - avoid hypotension  Mild leukocytosis - WBC's - 9.6->11.1   Anemia due to ESRD - Hb - 8.9 - on iron supplement via HD as per pt  Hospital day # 0  Neurology will sign off. Please call with questions. Pt will follow up with stroke clinic NP at [facility] in about 4 weeks. Thanks for the consult.  Rosalin Hawking, MD PhD Stroke Neurology 09/19/2019 3:57 PM  To contact Stroke Continuity provider, please refer to [email or web address] After hours, contact General Neurology

## 2019-09-19 NOTE — Consult Note (Signed)
[facility] Renal Consultation Note    Indication for Consultation:  Management of ESRD/hemodialysis, anemia, hypertension/volume, and secondary hyperparathyroidism. PCP:  HPI: [name] is a 59 y.o. female with ESRD, HTN, T2DM, and HL who was admitted with stroke.  Presented to ED via EMS yesterday after acute onset L sided weakness, facial droop, and slurred speech. Developed while driving from her home to her sister's home - she remembers severe dizziness and heaviness to L side. Her sister called 56 and she was transported to [facility] as CODE STROKE.  In ED - head CT initially clear. Vitals stable. Offered but refused tPA. Labs showed Na 137, K 4.1, Ca 9.9, WBC 11.1, Hgb 8.9. MRI brain later showed: background extensive small vessel ischemic changes, old R frontal infarct, 1cm acute infarct in L cerebellum, and subcentimeter acute infarct near R side 4th ventricle. She had no knowledge of prior CVA but reports that had similar presenting symptoms about 1.5years ago which resolved on their own and she never saw physician for this.  Today - seen in room. Speech no longer slurred and cleared to eat. Having ongoing L side weakness and aching. Facial movements not symmetrical - still with mild L sided weakness. Work-up so far: CTA with patent carotids and posterior circulation, echo grossly normal. A1c 6.7%. LDL 127, HDL 47.  Dialyzes on MWF schedule at [facility] - her last HD was Fri 5/14 which she completed in entirety. Looks like had some intermittent hypotension during her last HD. Reports also that occasionally will have dialysis circuit clotting during her treatments. She denies prior CVT or PE. No recent fistula interventions.  Looks like she has been evaluated this morning by CIR - awaiting approval.  Past Medical History:  Diagnosis Date  . Accelerated hypertension   . Aortic atherosclerosis ([place])   . Arthritis of knee    bilateral  . Diabetes mellitus    type 2  - no meds  . Diabetic gastroparesis ([place])   . Diverticulosis   . ESRD (end stage renal disease) ([place])   . Fibroid uterus   . GERD (gastroesophageal reflux disease)   . Hyperlipidemia   . Hypertension   . HYPERTENSION, BENIGN SYSTEMIC 07/03/2006        . Hypertensive emergency 02/05/2019  . Hypokalemia   . IDA (iron deficiency anemia)   . Nausea   . Renal disorder   . Umbilical hernia   . Wears dentures    Past Surgical History:  Procedure Laterality Date  . AV FISTULA PLACEMENT Left 02/17/2019   Procedure: ARTERIOVENOUS (AV) FISTULA CREATION LEFT UPPER ARM;  Surgeon: Waynetta Sandy, MD;  Location: [place];  Service: Vascular;  Laterality: Left;  . CATARACT EXTRACTION     right eye  . CESAREAN SECTION     x2  . CHOLECYSTECTOMY     laparoscopic  . FISTULA SUPERFICIALIZATION Left 04/06/2019   Procedure: FISTULA SUPERFICIALIZATION LEFT BRACHIOCEPHALIC;  Surgeon: Waynetta Sandy, MD;  Location: [place];  Service: Vascular;  Laterality: Left;  Transposition left arm brachiocephalic fistula.  . IR FLUORO GUIDE CV LINE RIGHT  02/09/2019  . IR FLUORO GUIDE CV LINE RIGHT  03/05/2019  . IR US GUIDE VASC ACCESS RIGHT  02/09/2019  . MULTIPLE TOOTH EXTRACTIONS    . REDUCTION MAMMAPLASTY Bilateral    Family History  Problem Relation Age of Onset  . Hypertension Mother   . Diabetes Mother   . Cancer Father  lung  . Colon cancer Neg Hx   . Stomach cancer Neg Hx   . Esophageal cancer Neg Hx    Social History:  reports that she quit smoking about 7 years ago. Her smoking use included cigarettes. She quit after 10.00 years of use. She has never used smokeless tobacco. She reports that she does not drink alcohol or use drugs. Works full time as custodian at [employer].  ROS: As per HPI otherwise negative.  Physical Exam: Vitals:   09/18/19 2200 09/19/19 0022 09/19/19 0546 09/19/19 1202  BP: 120/70 116/66 119/66 (!) 111/55  Pulse: (!) 107 100 89 96  Resp: _0 Temp:  98.9 F (37.2 C) 98.3 F (36.8 C) 98 F (36.7 C)  TempSrc:  Oral Oral Oral  SpO2: 100% 100% 100% 100%  Weight:      Height:         General: Well developed, well nourished, in no acute distress. Head: Normocephalic, atraumatic. Mild L facial weakness with smile/blink. Neck: Supple without lymphadenopathy/masses. JVD not elevated. Lungs: Clear bilaterally to auscultation without wheezes, rales, or rhonchi.  Heart: RRR with normal S1, S2. No murmurs, rubs, or gallops appreciated. Abdomen: Soft, non-tender, non-distended with normoactive bowel sounds.  Musculoskeletal:  Strength and tone appear normal for age. Lower extremities: No edema or ischemic changes, no open wounds. Neuro: Alert and oriented X 3. Reduced strength LUE, drooping smile and blink to L face. Psych:  Responds to questions appropriately with a normal affect. Dialysis Access: LUE AVF + bruit  Allergies  Allergen Reactions  . Lisinopril Anaphylaxis and Other (See Comments)    angioedema  . Venofer  [Ferric Oxide]     Other reaction(s): Back Pain  . Camellia Swelling and Other (See Comments)    Angioedema   . Jardiance [Empagliflozin] Swelling and Rash   Prior to Admission medications   Medication Sig Start Date End Date Taking? Authorizing Provider  Accu-Chek Softclix Lancets lancets Use as instructed Patient taking differently: 1 each by Other route See admin instructions. Use as instructed 09/29/18  Yes Diallo, Abdoulaye, MD  acetaminophen (TYLENOL) 500 MG tablet Take 1,000 mg by mouth every 6 (six) hours as needed for moderate pain or headache.   Yes [provider]  aspirin EC 325 MG tablet Take 325 mg by mouth daily.   Yes [provider]  B Complex-C-Folic Acid (DIALYVITE 277) 0.8 MG TABS Take 1 tablet by mouth 3 (three) times a week. M, w, f 03/02/19  Yes [provider]  Blood Glucose Monitoring Suppl (ACCU-CHEK AVIVA PLUS) w/Device KIT Use to check sugar three times a  day Patient taking differently: 1 each by Other route in the morning, at noon, and at bedtime.  05/14/17  Yes [name], MD  glucose blood (ACCU-CHEK AVIVA PLUS) test strip Use as instructed Patient taking differently: 1 each by Other route See admin instructions. Use as instructed 09/29/18  Yes Diallo, Abdoulaye, MD  Lancets (ACCU-CHEK SOFT TOUCH) lancets Use to check sugars three times a day Patient taking differently: 1 each by Other route in the morning, at noon, and at bedtime.  09/24/18  Yes Diallo, Abdoulaye, MD  metoCLOPramide (REGLAN) 5 MG tablet Take 1 tablet (5 mg total) by mouth 4 (four) times daily -  before meals and at bedtime. Patient taking differently: Take 5 mg by mouth 3 (three) times daily before meals.  06/29/19  Yes Pyrtle, Lajuan Lines, MD  ondansetron (ZOFRAN ODT) 4 MG disintegrating  tablet Take 1-2 tablets (4-8 mg total) by mouth every 8 (eight) hours as needed for nausea or vomiting. 06/29/19  Yes Pyrtle, Lajuan Lines, MD  Vitamin D, Ergocalciferol, (DRISDOL) 1.25 MG (50000 UT) CAPS capsule Take 50,000 Units by mouth every Monday.    Yes [provider]   Current Facility-Administered Medications  Medication Dose Route Frequency Provider Last Rate Last Admin  . acetaminophen (TYLENOL) tablet 650 mg  650 mg Oral Q6H PRN Gladys Damme, MD       Or  . acetaminophen (TYLENOL) suppository 650 mg  650 mg Rectal Q6H PRN Gladys Damme, MD      . aspirin EC tablet 325 mg  325 mg Oral Daily Gladys Damme, MD   325 mg at 09/19/19 0954  . atorvastatin (LIPITOR) tablet 40 mg  40 mg Oral Daily Enid Derry, [name], DO   40 mg at 09/19/19 0954  . Chlorhexidine Gluconate Cloth 2 % PADS 6 each  6 each Topical Daily Dickie La, MD      . heparin injection 5,000 Units  5,000 Units Subcutaneous Q8H Gladys Damme, MD   5,000 Units at 09/19/19 1302  . polyethylene glycol (MIRALAX / GLYCOLAX) packet 17 g  17 g Oral Daily PRN Gladys Damme, MD      . sodium chloride flush (NS) 0.9  % injection 3 mL  3 mL Intravenous Once Gladys Damme, MD       Labs: Basic Metabolic Panel: Recent Labs  Lab 09/18/19 1227 09/18/19 1236 09/19/19 0359  NA 135 134* 137  K 4.2 4.3 4.1  CL 93* 98 96*  CO2 24  --  27  GLUCOSE 173* 170* 83  BUN 29* 31* 38*  CREATININE 7.97* 9.20* 9.52*  CALCIUM 9.9  --  9.9   Liver Function Tests: Recent Labs  Lab 09/18/19 1227  AST 10*  ALT 9  ALKPHOS 78  BILITOT 0.7  PROT 7.4  ALBUMIN 3.5   CBC: Recent Labs  Lab 09/18/19 1227 09/18/19 1236 09/19/19 0359  WBC 9.6  --  11.1*  NEUTROABS 7.4  --   --   HGB 9.5* 10.5* 8.9*  HCT 33.1* 31.0* 31.2*  MCV 79.6*  --  78.8*  PLT 468*  --  503*   CBG: Recent Labs  Lab 09/18/19 1225  GLUCAP 159*   Studies/Results: CT Code Stroke CTA Head W/WO contrast  Result Date: 09/18/2019 CLINICAL DATA:  Acute presentation with left sided weakness and left facial droop. Slurred speech. EXAM: CT ANGIOGRAPHY HEAD AND NECK TECHNIQUE: Multidetector CT imaging of the head and neck was performed using the standard protocol during bolus administration of intravenous contrast. Multiplanar CT image reconstructions and MIPs were obtained to evaluate the vascular anatomy. Carotid stenosis measurements (when applicable) are obtained utilizing NASCET criteria, using the distal internal carotid diameter as the denominator. CONTRAST:  Not annotated. COMPARISON:  Head CT same day FINDINGS: CTA NECK FINDINGS Aortic arch: Aortic atherosclerosis. Branching pattern is normal without origin stenosis. Right carotid system: Common carotid artery widely patent to the bifurcation. Minimal atherosclerotic plaque at the carotid bifurcation and ICA bulb but no stenosis. Cervical ICA widely patent. Left carotid system: Common carotid artery widely patent to the bifurcation. Mild atherosclerotic plaque at the ICA bulb. Minimal diameter 3.5 mm. Compared to a more distal cervical ICA diameter of 4 mm, this indicates a 10-20% stenosis.  Vertebral arteries: Both vertebral arteries are patent through the cervical region. Right vertebral artery takes an early origin from the proximal subclavian.  Skeleton: Ordinary mid cervical spondylosis. Other neck: No mass or lymphadenopathy. Upper chest: Normal Review of the MIP images confirms the above findings CTA HEAD FINDINGS Anterior circulation: Both internal carotid arteries are patent through the skull base and siphon regions. There is mild atherosclerotic calcification in the carotid siphon regions but no stenosis greater than 30%. The anterior and middle cerebral vessels are patent without proximal stenosis, aneurysm or vascular malformation. No large or medium vessel occlusion. Posterior circulation: Both vertebral arteries widely patent to the basilar. No basilar stenosis. Posterior circulation branch vessels are normal. Patent posterior communicating arteries. Venous sinuses: Normal Anatomic variants: None Review of the MIP images confirms the above findings IMPRESSION: No acute large or medium vessel occlusion. Aortic Atherosclerosis (ICD10-I70.0). Atherosclerotic change at both carotid bifurcations. No stenosis on the right. 10-20% ICA bulb stenosis on the left. Ordinary atherosclerotic change in the carotid siphon regions but without stenosis greater than 30%. Electronically Signed   By: Nelson Chimes M.D.   On: 09/18/2019 12:51   CT Code Stroke CTA Neck W/WO contrast  Result Date: 09/18/2019 CLINICAL DATA:  Acute presentation with left sided weakness and left facial droop. Slurred speech. EXAM: CT ANGIOGRAPHY HEAD AND NECK TECHNIQUE: Multidetector CT imaging of the head and neck was performed using the standard protocol during bolus administration of intravenous contrast. Multiplanar CT image reconstructions and MIPs were obtained to evaluate the vascular anatomy. Carotid stenosis measurements (when applicable) are obtained utilizing NASCET criteria, using the distal internal carotid diameter  as the denominator. CONTRAST:  Not annotated. COMPARISON:  Head CT same day FINDINGS: CTA NECK FINDINGS Aortic arch: Aortic atherosclerosis. Branching pattern is normal without origin stenosis. Right carotid system: Common carotid artery widely patent to the bifurcation. Minimal atherosclerotic plaque at the carotid bifurcation and ICA bulb but no stenosis. Cervical ICA widely patent. Left carotid system: Common carotid artery widely patent to the bifurcation. Mild atherosclerotic plaque at the ICA bulb. Minimal diameter 3.5 mm. Compared to a more distal cervical ICA diameter of 4 mm, this indicates a 10-20% stenosis. Vertebral arteries: Both vertebral arteries are patent through the cervical region. Right vertebral artery takes an early origin from the proximal subclavian. Skeleton: Ordinary mid cervical spondylosis. Other neck: No mass or lymphadenopathy. Upper chest: Normal Review of the MIP images confirms the above findings CTA HEAD FINDINGS Anterior circulation: Both internal carotid arteries are patent through the skull base and siphon regions. There is mild atherosclerotic calcification in the carotid siphon regions but no stenosis greater than 30%. The anterior and middle cerebral vessels are patent without proximal stenosis, aneurysm or vascular malformation. No large or medium vessel occlusion. Posterior circulation: Both vertebral arteries widely patent to the basilar. No basilar stenosis. Posterior circulation branch vessels are normal. Patent posterior communicating arteries. Venous sinuses: Normal Anatomic variants: None Review of the MIP images confirms the above findings IMPRESSION: No acute large or medium vessel occlusion. Aortic Atherosclerosis (ICD10-I70.0). Atherosclerotic change at both carotid bifurcations. No stenosis on the right. 10-20% ICA bulb stenosis on the left. Ordinary atherosclerotic change in the carotid siphon regions but without stenosis greater than 30%. Electronically Signed    By: Nelson Chimes M.D.   On: 09/18/2019 12:51   MR BRAIN WO CONTRAST  Result Date: 09/19/2019 CLINICAL DATA:  Left arm and leg weakness with facial droop. Acute presentation yesterday. Negative CT evaluation. EXAM: MRI HEAD WITHOUT CONTRAST TECHNIQUE: Multiplanar, multiecho pulse sequences of the brain and surrounding structures were obtained without intravenous contrast. COMPARISON:  CT studies  09/18/2019 FINDINGS: Brain: Diffusion imaging shows a 1 cm acute to subacute infarction in the left cerebellum. Chronic small-vessel ischemic changes are present throughout the pons. Cerebral hemispheres show a subcentimeter acute infarction in the white matter posterior to the atrium of the right lateral ventricle. Chronic small-vessel ischemic changes are seen extensively throughout the cerebral hemispheric deep and subcortical white matter and basal ganglia. There is an old right frontal cortical and subcortical infarction. No mass lesion, hemorrhage, hydrocephalus or extra-axial collection. One could consider the possibility of coexistence demyelinating disease, but in a patient with diabetes, hypertension and hyperlipidemia, findings are presumed to represent small vessel disease. Vascular: Major vessels at the base of the brain show flow. Skull and upper cervical spine: Negative Sinuses/Orbits: Clear/normal Other: None IMPRESSION: Background pattern of extensive chronic small-vessel ischemic change throughout the brain as outlined above. 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 1 cm acute infarction in the left cerebellum. Subcentimeter acute infarction in the deep white matter just posterior to the atrium of the right lateral ventricle. Neither is associated with hemorrhage or mass effect. Electronically Signed   By: Nelson Chimes M.D.   On: 09/19/2019 07:49   ECHOCARDIOGRAM COMPLETE  Result Date: 09/19/2019    ECHOCARDIOGRAM REPORT   Patient Name:   [name] Date of Exam: 09/19/2019 Medical Rec  #:  [id number]    Height:       67.0 in Accession #:    [id number]   Weight:       235.7 lb Date of Birth:  1960-05-28    BSA:          2.168 m Patient Age:    25 years     BP:           119/66 mmHg Patient Gender: F            HR:           89 bpm. Exam Location:  Inpatient Procedure: 2D Echo, Cardiac Doppler and Color Doppler Indications:    Stroke 434.91/I163.9  History:        Patient has prior history of Echocardiogram examinations, most                 recent 06/22/2019. Risk Factors:Hypertension, Diabetes, Former                 Smoker, Dyslipidemia and Sleep Apnea. ESRD.  Sonographer:    Clayton Lefort RDCS (AE) Referring Phys: [place]  1. Left ventricular ejection fraction, by estimation, is 55 to 60%. The left ventricle has normal function. The left ventricle has no regional wall motion abnormalities. There is moderate asymmetric left ventricular hypertrophy of the basal and septal segments. Left ventricular diastolic parameters are consistent with Grade I diastolic dysfunction (impaired relaxation).  2. Right ventricular systolic function is normal. The right ventricular size is normal. There is normal pulmonary artery systolic pressure.  3. The mitral valve is normal in structure. No evidence of mitral valve regurgitation. No evidence of mitral stenosis.  4. The aortic valve is tricuspid. Aortic valve regurgitation is not visualized. Mild to moderate aortic valve sclerosis/calcification is present, without any evidence of aortic stenosis.  5. The inferior vena cava is normal in size with greater than 50% respiratory variability, suggesting right atrial pressure of 3 mmHg. FINDINGS  Left Ventricle: Left ventricular ejection fraction, by estimation, is 55 to 60%. The left ventricle has normal function. The left ventricle has no regional wall motion abnormalities.  The left ventricular internal cavity size was normal in size. There is  moderate asymmetric left ventricular hypertrophy of the  basal and septal segments. Left ventricular diastolic parameters are consistent with Grade I diastolic dysfunction (impaired relaxation). Right Ventricle: The right ventricular size is normal. No increase in right ventricular wall thickness. Right ventricular systolic function is normal. There is normal pulmonary artery systolic pressure. The tricuspid regurgitant velocity is 2.56 m/s, and  with an assumed right atrial pressure of 8 mmHg, the estimated right ventricular systolic pressure is 48.5 mmHg. Left Atrium: Left atrial size was normal in size. Right Atrium: Right atrial size was normal in size. Pericardium: There is no evidence of pericardial effusion. Mitral Valve: The mitral valve is normal in structure. There is mild thickening of the mitral valve leaflet(s). There is mild calcification of the mitral valve leaflet(s). Normal mobility of the mitral valve leaflets. Moderate mitral annular calcification. No evidence of mitral valve regurgitation. No evidence of mitral valve stenosis. MV peak gradient, 4.8 mmHg. The mean mitral valve gradient is 2.0 mmHg. Tricuspid Valve: The tricuspid valve is normal in structure. Tricuspid valve regurgitation is mild . No evidence of tricuspid stenosis. Aortic Valve: The aortic valve is tricuspid. Aortic valve regurgitation is not visualized. Mild to moderate aortic valve sclerosis/calcification is present, without any evidence of aortic stenosis. Aortic valve mean gradient measures 4.0 mmHg. Aortic valve peak gradient measures 8.0 mmHg. Aortic valve area, by VTI measures 3.44 cm. Pulmonic Valve: The pulmonic valve was normal in structure. Pulmonic valve regurgitation is not visualized. No evidence of pulmonic stenosis. Aorta: The aortic root is normal in size and structure. Venous: The inferior vena cava is normal in size with greater than 50% respiratory variability, suggesting right atrial pressure of 3 mmHg. IAS/Shunts: No atrial level shunt detected by color flow  Doppler.  LEFT VENTRICLE PLAX 2D LVIDd:         4.00 cm  Diastology LVIDs:         2.70 cm  LV e' lateral:   5.55 cm/s LV PW:         1.80 cm  LV E/e' lateral: 14.1 LV IVS:        1.80 cm  LV e' medial:    4.79 cm/s LVOT diam:     2.30 cm  LV E/e' medial:  16.4 LV SV:         91 LV SV Index:   42 LVOT Area:     4.15 cm  RIGHT VENTRICLE             IVC RV Basal diam:  3.50 cm     IVC diam: 1.10 cm RV S prime:     11.50 cm/s TAPSE (M-mode): 2.1 cm LEFT ATRIUM             Index       RIGHT ATRIUM           Index LA diam:        3.20 cm 1.48 cm/m  RA Area:     15.50 cm LA Vol (A2C):   75.0 ml 34.59 ml/m RA Volume:   41.70 ml  19.23 ml/m LA Vol (A4C):   47.9 ml 22.09 ml/m LA Biplane Vol: 63.1 ml 29.10 ml/m  AORTIC VALVE AV Area (Vmax):    3.06 cm AV Area (Vmean):   3.02 cm AV Area (VTI):     3.44 cm AV Vmax:           141.00  cm/s AV Vmean:          94.900 cm/s AV VTI:            0.263 m AV Peak Grad:      8.0 mmHg AV Mean Grad:      4.0 mmHg LVOT Vmax:         104.00 cm/s LVOT Vmean:        69.000 cm/s LVOT VTI:          0.218 m LVOT/AV VTI ratio: 0.83  AORTA Ao Root diam: 3.50 cm Ao Asc diam:  3.10 cm MITRAL VALVE                TRICUSPID VALVE MV Area (PHT): 1.25 cm     TR Peak grad:   26.2 mmHg MV Peak grad:  4.8 mmHg     TR Vmax:        256.00 cm/s MV Mean grad:  2.0 mmHg MV Vmax:       1.09 m/s     SHUNTS MV Vmean:      69.7 cm/s    Systemic VTI:  0.22 m MV Decel Time: 607 msec     Systemic Diam: 2.30 cm MV E velocity: 78.50 cm/s MV A velocity: 108.00 cm/s MV E/A ratio:  0.73 Jenkins Rouge MD Electronically signed by Jenkins Rouge MD Signature Date/Time: 09/19/2019/10:12:49 AM    Final    CT HEAD CODE STROKE WO CONTRAST  Result Date: 09/18/2019 CLINICAL DATA:  Code stroke. Acute stroke with left-sided weakness and left facial droop. Slurred speech. EXAM: CT HEAD WITHOUT CONTRAST TECHNIQUE: Contiguous axial images were obtained from the base of the skull through the vertex without intravenous contrast.  COMPARISON:  01/18/2012. FINDINGS: Brain: Chronic small-vessel ischemic changes of the hemispheric white matter. 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 No sign of acute infarction, mass lesion, hemorrhage, hydrocephalus or extra-axial collection. Vascular: There is atherosclerotic calcification of the major vessels at the base of the brain. Skull: Negative Sinuses/Orbits: Clear/normal Other: None ASPECTS ([place] Stroke Program Early CT Score) - Ganglionic level infarction (caudate, lentiform nuclei, internal capsule, insula, M1-M3 cortex): 7 - Supraganglionic infarction (M4-M6 cortex): 3 Total score (0-10 with 10 being normal): 10 IMPRESSION: 1. No acute finding. Mild chronic small-vessel change of the hemispheric white matter. 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 2. ASPECTS is 10. 3. These results were communicated to Dr. Leonel Ramsay at 12:38 pmon 5/15/2021by text page via the [facility] messaging system. Electronically Signed   By: Nelson Chimes M.D.   On: 09/18/2019 12:39    Dialysis Orders:  MWF at [facility] 4hr, 400/800, EDW 105.5kg, 3K/2.25Ca, UFP #3, LUE AVF, heparin 2000 bolus - Hectoral 32mg Iv q HD - Mircera 2220m IV q 2 weeks (last 5/10) - Venofer 100 x 10 ordered --> for Hgb 8.5, tsat 7% on 5/10.  Assessment/Plan: 1.  Acute L cerebellar + subcentimeter deep white matter R CVA: Persistent L sided weakness and facial drop, but improving. + dizziness. Echo normal. CTA without carotid or posterior circulation stenoses. Per neuro. Prior CVA on imaging that she was unaware of. Being evaluated for CIR. 2.  ESRD: Continue HD per MWF schedule - next HD 5/17. Will aim to avoid hypotension during dialysis.  3.  Hypertension/volume: Historically high BP, much better over past year and able to come of meds with improved volume status. Per OP med list, supposed to be on Coreg 12.65m13mID. No meds for now - permissive HTN  per neuro. 4.  Anemia (ESRD + IDA): Mircera  225 just given 5/10. Last tsat low - has tolerated Venofer as OP despite Fe allergy - will wait to give IV iron here. 5.  Metabolic bone disease: Ca ok, Phos pending. Resume home binder Lorin Picket) 6.  T2DM 7.  Hyperlipidemia: Prev on statin - off for unknown time, resumed here.  Veneta Penton, PA-C 09/19/2019, 1:13 PM  [employer]

## 2019-09-19 NOTE — Progress Notes (Signed)
  Echocardiogram 2D Echocardiogram has been performed.  [name] 09/19/2019, 9:09 AM

## 2019-09-19 NOTE — Evaluation (Signed)
Physical Therapy Evaluation Patient Details Name: [name] MRN: [id number] DOB: 01/30/1961 Today's Date: 09/19/2019   History of Present Illness  59 y.o. female presenting with left-sided weakness and facial droop. PMH is significant for HTN, ESRD with HD MWF, DMT2. CT head showed no hemorrhage, CTA showed no LVO or significant stenosis. Patient was offered tPA by neurology, but declined. MRI brain 1 cm acute infarction in the left cerebellum. Subcentimeter acute infarction in the deep white matter just posterior to the atrium of the right lateral ventricle. 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 She currently requires min assist with max cues to ambulate 10 feet due to LLE weakness and decr balance. She is at high risk for falling. She has multiple family members who live near her (and 55 yo son living with her), however they all work days. She thinks between family and friends she can arrange 24/7 supervision/assist upon discharge, however she will need time to confirm this plan. She will need further education in use of DME and gait training with goal to ascend 5 steps to enter apartment.  Pt currently with functional limitations due to the deficits listed below (see PT Problem List). Pt will benefit from skilled PT to increase their independence and safety with mobility to allow discharge to the venue listed below.       Follow Up Recommendations CIR;Supervision/Assistance - 24 hour    Equipment Recommendations  Rolling walker with 5" wheels    Recommendations for Other Services Rehab consult;OT consult     Precautions / Restrictions Precautions Precautions: Fall Restrictions Weight Bearing Restrictions: No      Mobility  Bed Mobility Overal bed mobility: Needs Assistance Bed Mobility: Supine to Sit     Supine to sit: Min guard     General bed  mobility comments: pt with difficulty manipulating linens to get LLE uncovered; slow, effortful to get LLE over EOB and to raise torso to sitting (near need for assist)  Transfers Overall transfer level: Needs assistance Equipment used: 1 person hand held assist Transfers: Sit to/from Stand Sit to Stand: Min assist         General transfer comment: pt needed bil UE assist to push to stand with pt hesitant due to fear of LLE giving away (it did not)  Ambulation/Gait Ambulation/Gait assistance: Min assist Gait Distance (Feet): 10 Feet Assistive device: 1 person hand held assist(2nd hand holding onto end of bed when able) Gait Pattern/deviations: Step-to pattern;Decreased step length - left;Decreased stance time - left;Decreased dorsiflexion - left;Decreased weight shift to left;Steppage Gait velocity: very slow Gait velocity interpretation: <1.31 ft/sec, indicative of household ambulator General Gait Details: difficulty advancing LLE due to foot drop and hip flexor weakness (plus gripper socks); vc for focus on reaching left heel forward to encourage Lt DF, however then needed cues for knee/hip flexion to allow left foot to clear and advance  Stairs            Wheelchair Mobility    Modified Rankin (Stroke Patients Only) Modified Rankin (Stroke Patients Only) Pre-Morbid Rankin Score: No symptoms Modified Rankin: Moderately severe disability     Balance Overall balance assessment: Needs assistance Sitting-balance support: No upper extremity supported;Feet unsupported Sitting balance-Leahy Scale: Fair Sitting balance - Comments: able to don socks at EOB with incr time and one LOB to left with assist to recover   Standing balance  support: Single extremity supported Standing balance-Leahy Scale: Poor Standing balance comment: requires UE support                             Pertinent Vitals/Pain Pain Assessment: No/denies pain    Home Living Family/patient  expects to be discharged to:: Private residence Living Arrangements: Children(19 yo son) Available Help at Discharge: Family;Available PRN/intermittently(son, daughter, 5 sisters all nearby, all work) Type of Home: Apartment Home Access: Stairs to enter Entrance Stairs-Rails: Right;Left;Can reach both Technical brewer of Steps: 5 Home Layout: One level Home Equipment: None      Prior Function Level of Independence: Independent         Comments: denies falls in past 6 months; 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[employer]     Homer: Right    Extremity/Trunk Assessment   Upper Extremity Assessment Upper Extremity Assessment: Defer to OT evaluation(left grip weaker than rt)    Lower Extremity Assessment Lower Extremity Assessment: RLE deficits/detail;LLE deficits/detail RLE Deficits / Details: AROM/strength/coordination WFL LLE Deficits / Details: AAROM WFL (requires assist due to weakness); hip flexion 3-, knee extension 3-, ankle DF 3- LLE Sensation: WNL LLE Coordination: (unable to accurately assess due to weakness)    Cervical / Trunk Assessment Cervical / Trunk Assessment: Other exceptions Cervical / Trunk Exceptions: overweight  Communication   Communication: No difficulties  Cognition Arousal/Alertness: Awake/alert Behavior During Therapy: Flat affect Overall Cognitive Status: Within Functional Limits for tasks assessed                                        General Comments      Exercises Other Exercises Other Exercises: seated in chair, encouraged bil ankle pumps, knee extension, hip flexion   Assessment/Plan    PT Assessment Patient needs continued PT services  PT Problem List Decreased strength;Decreased balance;Decreased mobility;Decreased knowledge of use of DME;Obesity       PT Treatment Interventions DME instruction;Gait training;Stair training;Functional mobility training;Therapeutic activities;Balance  training;Neuromuscular re-education;Patient/family education    PT Goals (Current goals can be found in the Care Plan section)  Acute Rehab PT Goals Patient Stated Goal: to regain independence PT Goal Formulation: With patient Time For Goal Achievement: 10/03/19 Potential to Achieve Goals: Good    Frequency Min 4X/week   Barriers to discharge Inaccessible home environment;Decreased caregiver support 5 steps to enter apartment; family ?can provide 24/7    Co-evaluation               AM-PAC PT "6 Clicks" Mobility  Outcome Measure Help needed turning from your back to your side while in a flat bed without using bedrails?: None Help needed moving from lying on your back to sitting on the side of a flat bed without using bedrails?: A Little Help needed moving to and from a bed to a chair (including a wheelchair)?: A Little Help needed standing up from a chair using your arms (e.g., wheelchair or bedside chair)?: A Little Help needed to walk in hospital room?: A Little Help needed climbing 3-5 steps with a railing? : A Lot 6 Click Score: 18    End of Session Equipment Utilized During Treatment: Gait belt Activity Tolerance: Patient tolerated treatment well Patient left: in chair;with call bell/phone within reach;with chair alarm set Nurse Communication: Mobility status;Other (comment)(not ready for d/c) PT Visit Diagnosis:  Hemiplegia and hemiparesis;Other abnormalities of gait and mobility (R26.89) Hemiplegia - Right/Left: Left Hemiplegia - dominant/non-dominant: Non-dominant Hemiplegia - caused by: Cerebral infarction    Time: 5927-6394 PT Time Calculation (min) (ACUTE ONLY): 31 min   Charges:   PT Evaluation $PT Eval Moderate Complexity: 1 Mod PT Treatments $Gait Training: 8-22 mins         [name], PT Pager [phone]   [name] 09/19/2019, 10:01 AM

## 2019-09-20 ENCOUNTER — Inpatient Hospital Stay (HOSPITAL_COMMUNITY): Payer: BC Managed Care – PPO

## 2019-09-20 ENCOUNTER — Other Ambulatory Visit: Payer: Self-pay | Admitting: Physician Assistant

## 2019-09-20 DIAGNOSIS — I63342 Cerebral infarction due to thrombosis of left cerebellar artery: Secondary | ICD-10-CM

## 2019-09-20 DIAGNOSIS — R531 Weakness: Secondary | ICD-10-CM

## 2019-09-20 DIAGNOSIS — R112 Nausea with vomiting, unspecified: Secondary | ICD-10-CM

## 2019-09-20 DIAGNOSIS — I639 Cerebral infarction, unspecified: Secondary | ICD-10-CM

## 2019-09-20 LAB — CBC
HCT: 42.8 % (ref 36.0–46.0)
Hemoglobin: 12.2 g/dL (ref 12.0–15.0)
MCH: 23.1 pg — ABNORMAL LOW (ref 26.0–34.0)
MCHC: 28.5 g/dL — ABNORMAL LOW (ref 30.0–36.0)
MCV: 80.9 fL (ref 80.0–100.0)
Platelets: 582 10*3/uL — ABNORMAL HIGH (ref 150–400)
RBC: 5.29 MIL/uL — ABNORMAL HIGH (ref 3.87–5.11)
RDW: 18.2 % — ABNORMAL HIGH (ref 11.5–15.5)
WBC: 14.9 10*3/uL — ABNORMAL HIGH (ref 4.0–10.5)
nRBC: 0.2 % (ref 0.0–0.2)

## 2019-09-20 LAB — URINALYSIS, ROUTINE W REFLEX MICROSCOPIC
Bilirubin Urine: NEGATIVE
Glucose, UA: 50 mg/dL — AB
Ketones, ur: 5 mg/dL — AB
Nitrite: NEGATIVE
Protein, ur: 100 mg/dL — AB
Specific Gravity, Urine: 1.014 (ref 1.005–1.030)
WBC, UA: >50 WBC/hpf — ABNORMAL HIGH (ref 0–5)
pH: 8 (ref 5.0–8.0)

## 2019-09-20 LAB — TROPONIN I (HIGH SENSITIVITY)
Troponin I (High Sensitivity): 48 ng/L — ABNORMAL HIGH (ref <18)
Troponin I (High Sensitivity): 63 ng/L — ABNORMAL HIGH (ref <18)

## 2019-09-20 IMAGING — MR MR HEAD W/O CM
7 of 11 series · 31 of 48 positions shown · non-contrast
Comparison: Brain MRI [DATE] and earlier.

CLINICAL DATA: 58-year-old female dialysis patient with code stroke
presentation on [DATE], unexplained altered mental status. Did
not receive IV tPA. Brain MRI yesterday positive for PATRIARCHE
foci in the left cerebellum and right periatrial white matter.

EXAM:
MRI HEAD WITHOUT CONTRAST
TECHNIQUE: Multiplanar, multiecho pulse sequences of the brain and surrounding
structures were obtained without intravenous contrast.

[Series 3: DWI · axial · 3.0mm · 0.94mm/px · z∈[-6,+140]mm · 9 of 100 slices shown (1 of 2)]
[im 1/100]
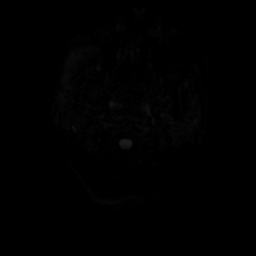
[im 13/100]
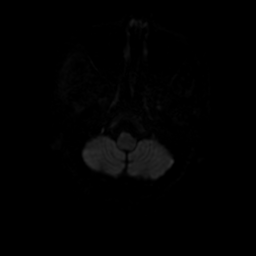
[im 25/100]
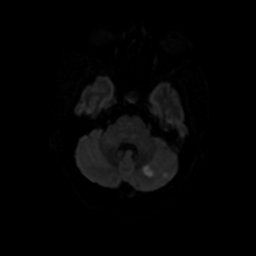
[im 38/100]
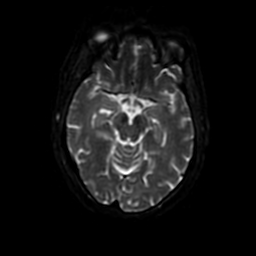
[im 50/100]
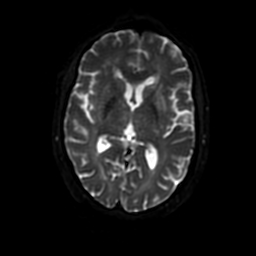
[im 62/100]
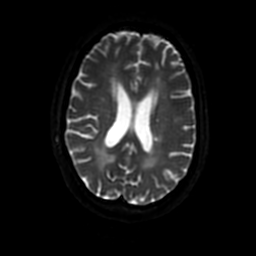
[im 75/100]
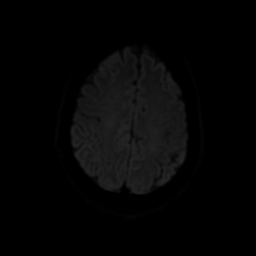
[im 87/100]
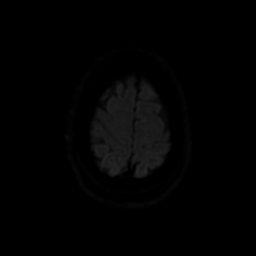
[im 100/100]
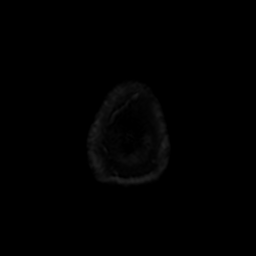

[Series 4: DWI · coronal · 4.0mm · 0.94mm/px · 7 of 72 slices shown (2 of 2)]
[im 1/72]
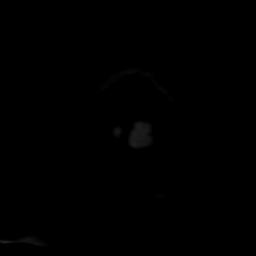
[im 12/72]
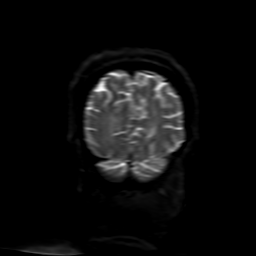
[im 24/72]
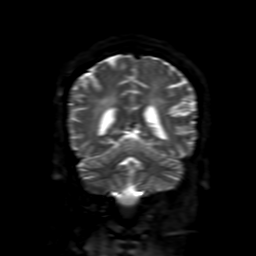
[im 36/72]
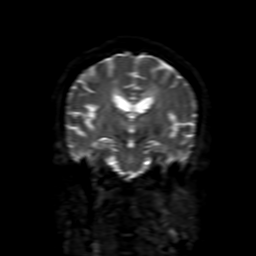
[im 48/72]
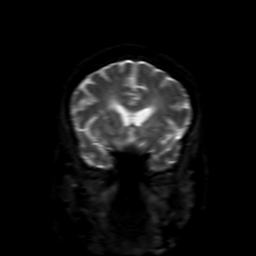
[im 60/72]
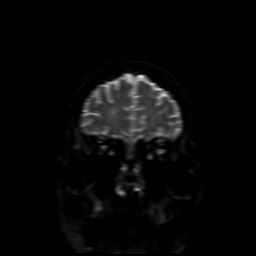
[im 72/72]
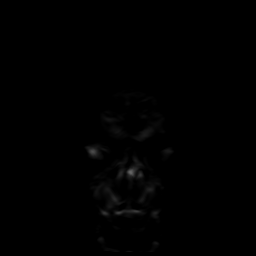

[Series 5: FLAIR · sagittal · 5.0mm · 0.23mm/px · 2 of 24 slices shown (1 of 2)]
[im 1/24]
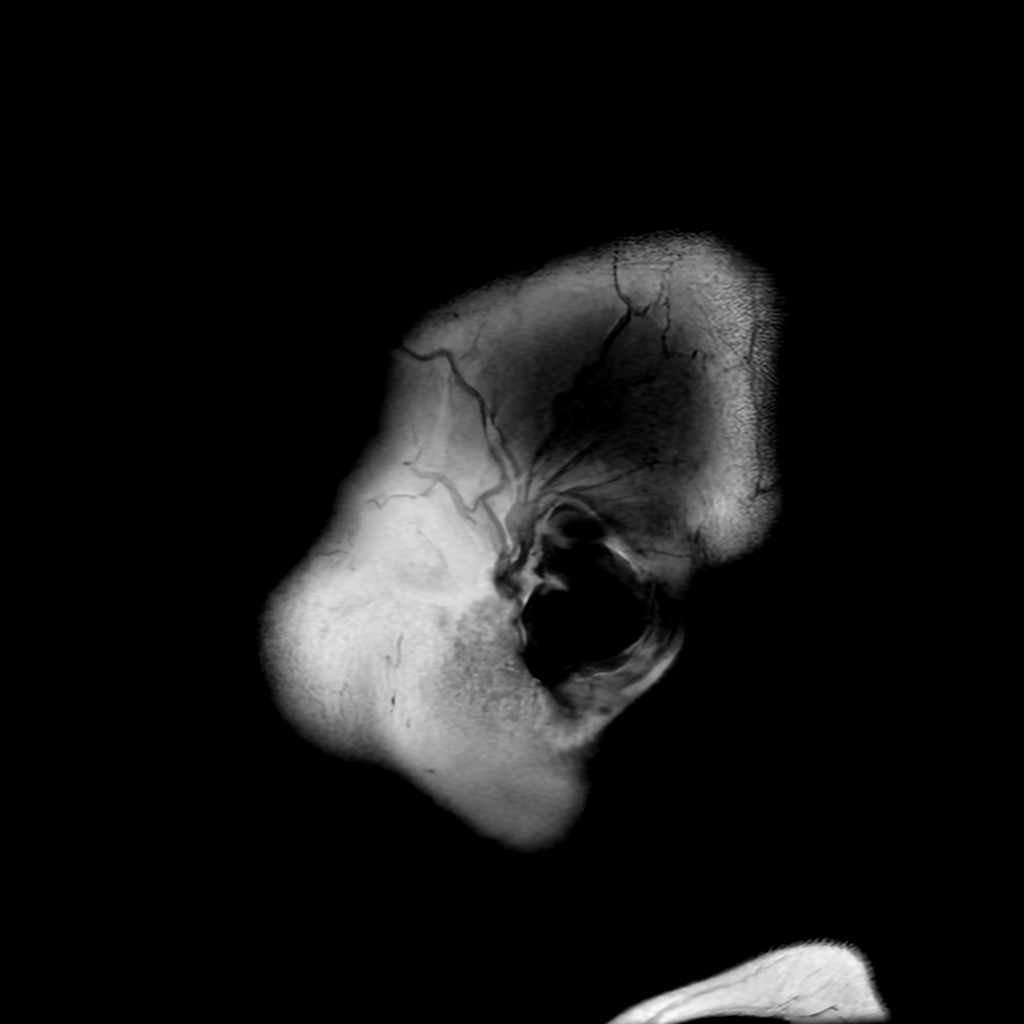
[im 24/24]
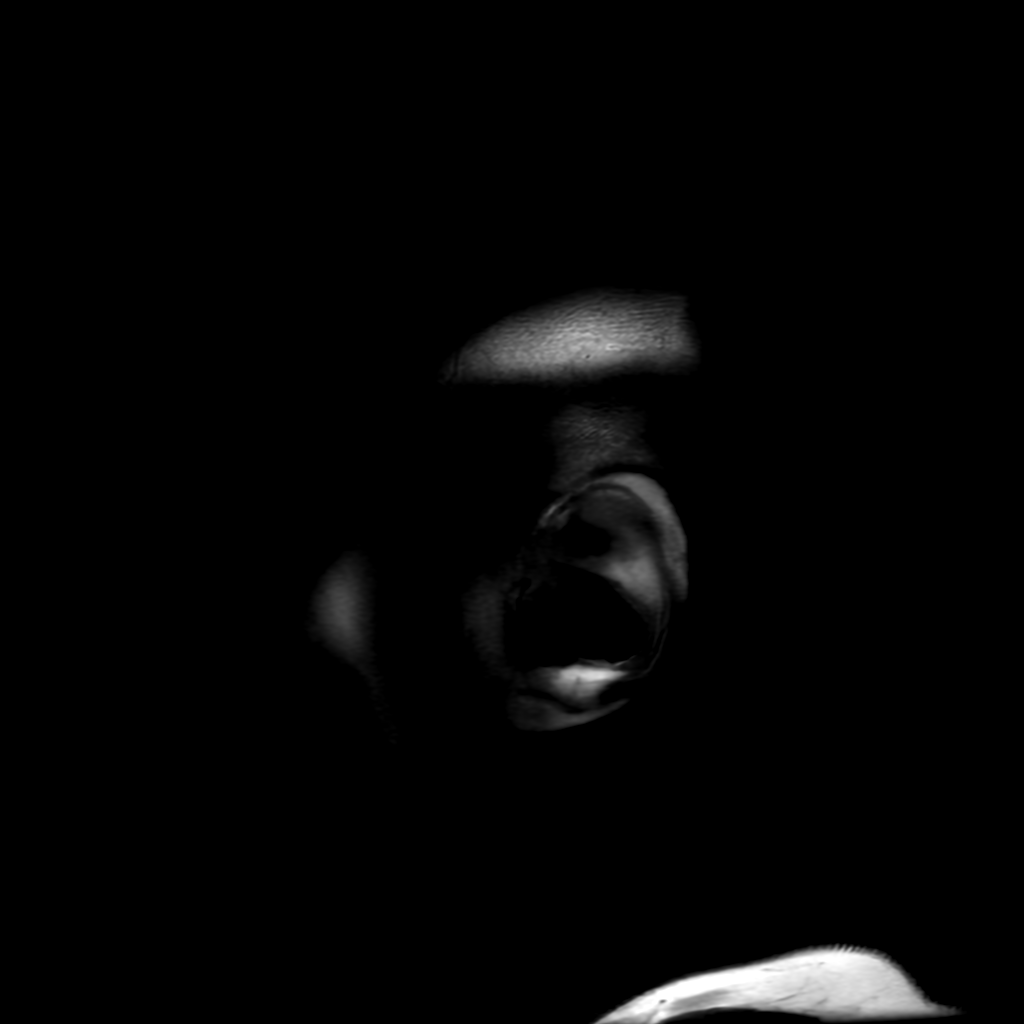

[Series 7: FLAIR · axial · 3.0mm · 0.41mm/px · z∈[-1,+147]mm · 2 of 26 slices shown (2 of 2)]
[im 1/26]
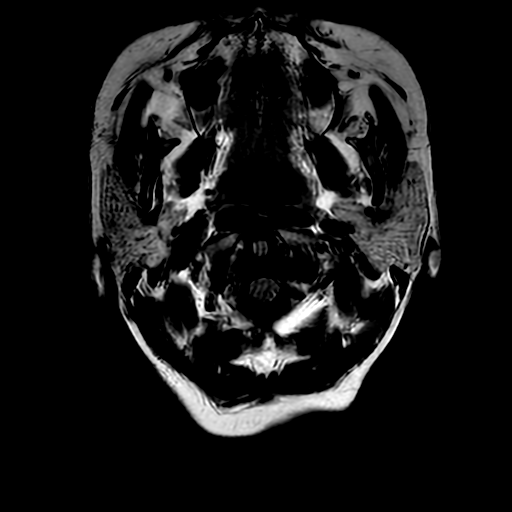
[im 26/26]
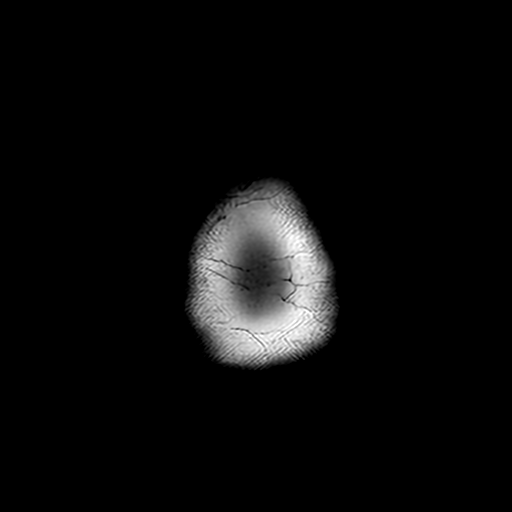

[Series 250: ADC · axial · 3.0mm · 0.94mm/px · z∈[-6,+140]mm · 4 of 50 slices shown (1 of 3)]
[im 1/50]
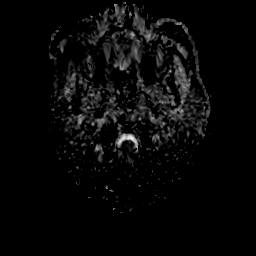
[im 17/50]
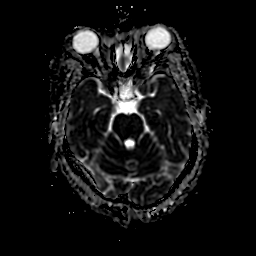
[im 33/50]
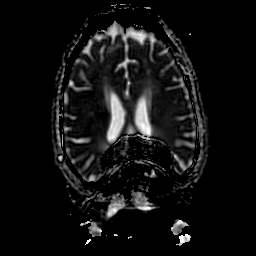
[im 50/50]
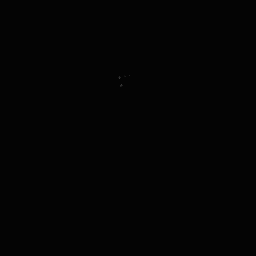

[Series 350: ADC · axial · 3.0mm · 0.94mm/px · z∈[-6,+140]mm · 4 of 49 slices shown (2 of 3)]
[im 1/49]
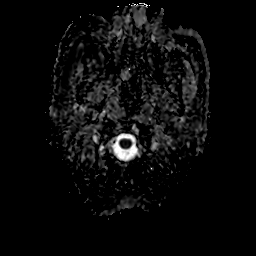
[im 17/49]
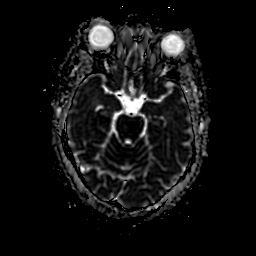
[im 33/49]
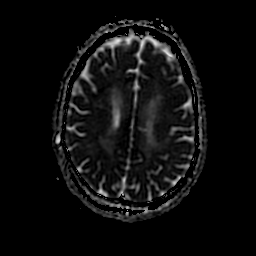
[im 49/49]
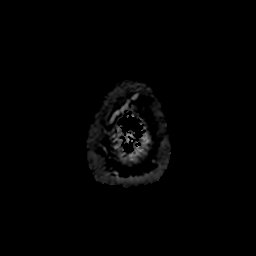

[Series 450: ADC · coronal · 4.0mm · 0.94mm/px · 3 of 36 slices shown (3 of 3)]
[im 1/36]
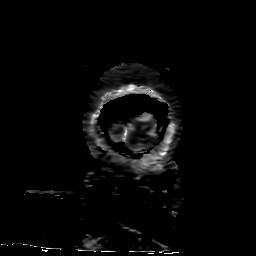
[im 18/36]
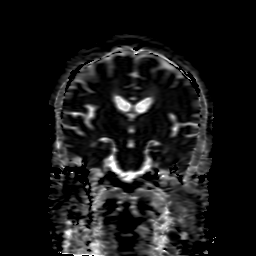
[im 36/36]
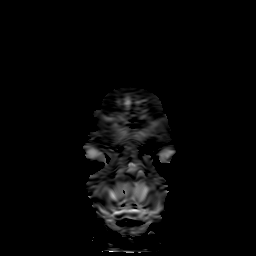

[31 of 48 positions shown; findings below may reference images not displayed]

FINDINGS: Brain: Small foci of restricted diffusion in the right periatrial
white matter and left cerebellum persist, with the latter slightly
more intense on DWI today, and with the small adjacent left
cerebellar area of involvement (series 3, image 13). Associated T2
and FLAIR hyperintensity as before with no evidence of acute
hemorrhage, no mass effect.

No new areas of abnormal diffusion. Widespread bilateral cerebral
white matter T2 and FLAIR hyperintensity with a small area of focal
cortical encephalomalacia in the right superior frontal gyrus,
patchy abnormal signal in the bilateral deep gray nuclei, and in the
pons. Occasional chronic micro hemorrhages again noted.

No midline shift, mass effect, evidence of mass lesion,
ventriculomegaly, extra-axial collection or acute intracranial
hemorrhage. Cervicomedullary junction and pituitary are within
normal limits.

Vascular: Major intracranial vascular flow voids are stable.

Skull and upper cervical spine: Negative visible cervical spine.
Bone marrow signal is stable and within normal limits.

Sinuses/Orbits: Stable and negative.

Other: Visible internal auditory structures appear normal. Mastoids
remain clear. Scalp and face soft tissues appear negative.
IMPRESSION: 1. Small acute infarcts in the right periatrial white matter and
left cerebellum are not significantly changed from yesterday, with
no associated hemorrhage or mass effect.

2. No new acute intracranial abnormality identified. Underlying
signal changes compatible with moderately advanced chronic ischemia.

## 2019-09-20 IMAGING — DX DG CHEST 1V
1 series · 1 of 1 positions shown · non-contrast
Comparison: [DATE]

CLINICAL DATA: Acute coronary syndrome.

EXAM:
CHEST  1 VIEW

[chest]
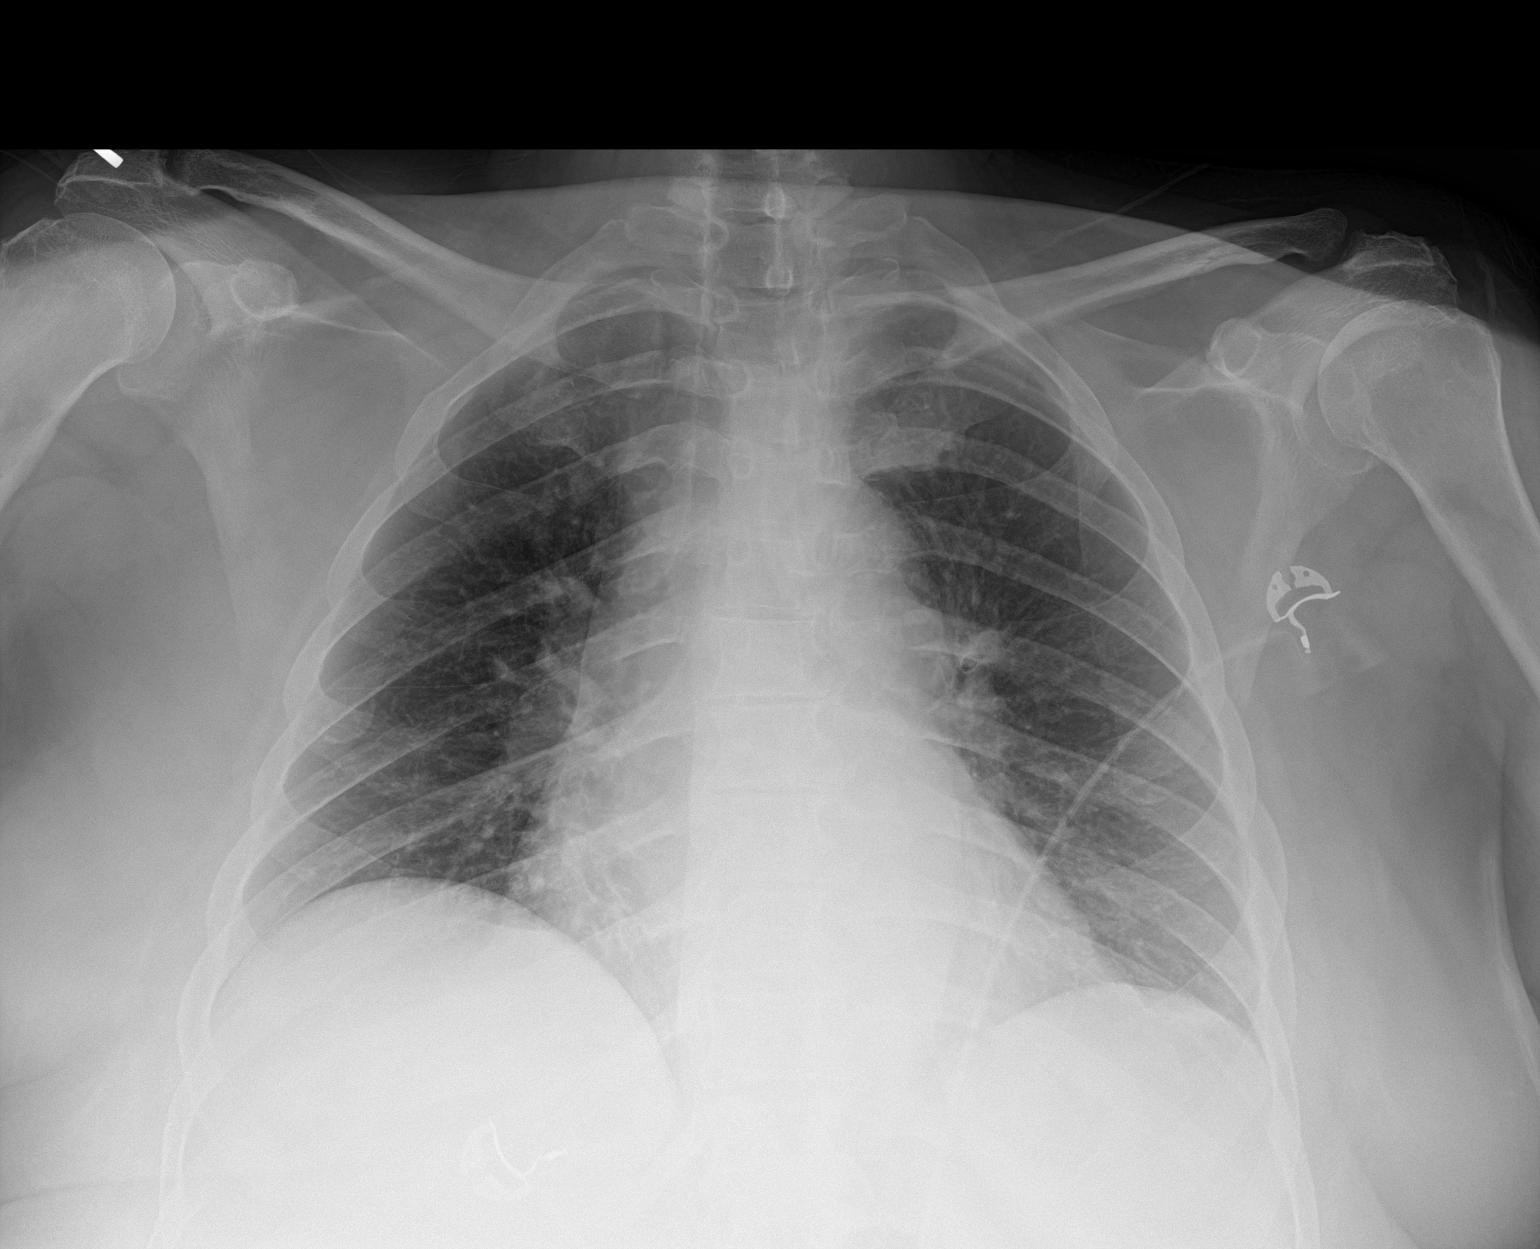

[1 of 1 positions shown; findings below may reference images not displayed]

FINDINGS: Cardiac silhouette is mildly enlarged. No mediastinal or hilar
masses. No evidence of adenopathy.

Clear lungs.  No pleural effusion or pneumothorax.

Skeletal structures are grossly intact.
IMPRESSION: 1. No acute cardiopulmonary disease.
2. Mild cardiomegaly.

## 2019-09-20 MED ORDER — METOPROLOL TARTRATE 5 MG/5ML IV SOLN
2.5000 mg | Freq: Once | INTRAVENOUS | Status: DC | PRN
  Filled 2019-09-20: qty 5

## 2019-09-20 MED ORDER — ATENOLOL 25 MG PO TABS: 25.0000 mg | ORAL_TABLET | Freq: Once | ORAL | Status: DC | PRN

## 2019-09-20 MED ORDER — LORAZEPAM 2 MG/ML IJ SOLN
0.5000 mg | Freq: Once | INTRAMUSCULAR | Status: AC
  Administered 2019-09-20: 0.5 mg via INTRAVENOUS
  Filled 2019-09-20: qty 1

## 2019-09-20 NOTE — Progress Notes (Signed)
Provider at patient bedside at this time. Updated provider of patient's status. Provider stated that we are allowing permissive HTN 220/110 at this time. Provider also stated she will put in an order to reflect that statement. Will continue to monitor at this time.

## 2019-09-20 NOTE — Hospital Course (Addendum)
DAPT through June 5th, then plavix alone  F/u w/ [facility] in 4 weeks after admission  Follow up with cardiology regarding 30 day Holter monitor

## 2019-09-20 NOTE — Progress Notes (Signed)
PT Cancellation Note  Patient Details Name: [name] MRN: [id number] DOB: 05/19/1960   Cancelled Treatment:    Reason Eval/Treat Not Completed: Medical issues which prohibited therapy  Spoke with Joycelyn Schmid, RN. Although EKG was negative, pt remains tachycardic at rest (125), is very nauseated that has not changed with medication, and BP is elevated 197/114. MD is aware of pt's symptoms. Will hold PT at this time and attempt to resume 5/18.   [name], PT Pager [phone]  [name] 09/20/2019, 3:55 PM

## 2019-09-20 NOTE — Progress Notes (Signed)
Central tele called to notify that patient had ST elevation in V lead and MCL lead. No complaints of cardiac symptoms from the patient, just nausea at this time. MD notified and stated that she will order a 12-lead EKG.

## 2019-09-20 NOTE — Progress Notes (Signed)
OT Cancellation Note  Patient Details Name: ORABELLE RYLEE MRN: [id number] DOB: 23-Jul-1960   Cancelled Treatment:    Reason Eval/Treat Not Completed: Other (comment)- pt nauseated and requested OT to return later. Will follow and see as able.   Jolaine Artist, OT Acute Rehabilitation Services Pager [phone] Office [phone]    Delight Stare 09/20/2019, 8:48 AM

## 2019-09-20 NOTE — Progress Notes (Signed)
Paged provider to notify of increased BP of 199/91 and HR of 124. Awaiting return call at this time.

## 2019-09-20 NOTE — Progress Notes (Signed)
OT Cancellation Note  Patient Details Name: [name] MRN: [id number] DOB: 30-Sep-1960   Cancelled Treatment:    Reason Eval/Treat Not Completed: Medical issues which prohibited therapy, per RN pt nauseated and waiting on EKG due to ST elevation; requesting OT to hold at this time.  Will follow and see as able.   [place] Pager [phone] Office [phone]   Delight Stare 09/20/2019, 1:50 PM

## 2019-09-20 NOTE — Progress Notes (Addendum)
Subjective  Went to check in on patient at bedside.  Daughter is at bedside with patient. Patient reports that she is feeling overall better but is very tired.  She had severe nausea with emesis.  Daughter and patient report that she has these episodes at home secondary to gastroparesis.  She has had multiple admissions for intractable nausea vomiting.  Daughter reports that this happened around the time she started dialysis. Daughter notes that patient is not back at her baseline.  She seems very tired.  Patient also complains of left sided eye pressure that is otherwise not painful.  She also reports that she has decreased visual acuity when covering her right eye.  When covering her left eye, visual acuity is normal.  Did not have Snellen chart  in room and could not objectively assess.  Patient denies any chest pain or shortness of breath at this time. She does not have any history of MI that she knows of. She denies any fevers or chills.  She does report that she feels warm in the room.  Objective Temperature: 98.8 Fahrenheit General: Ill-appearing female, lying in bed, sleepy.  Diaphoretic across forehead and face. awake, alert to questions, but sleepy and falls asleep during parts of interview.  HEENT: Cranial nerves grossly intact.  Extraocular movements are delayed.Subjective left sided blurriness when covering right eye.  Normal vision when covering left eye.  Face is symmetric, hearing intact.  Tongue is midline.  Patient has mild weakness with head rotation to the left side against resistance. Patient does not have any pronator drift.  Her strength is 5 out of 5 on handgrip on right side, 2 out of 5 on left side.  This is also consistent with bicep and tricep strength bilaterally.  She is able to perform finger-to-nose bilaterally, though markedly slow bilaterally.  Unable to assess gait at this time.   Assessment and plan  Tachycardia, nausea, vomiting Likely due to nausea and emesis;  which has been currently attributed to gastroparesis as patient has similar episodes outpatient since starting HD.  Can also consider possible infection with mildly increased WBC and tachycardia.  Blood culture, urine culture obtained.  Chest x-ray negative at this time.  If dehydration, will not replete fluids at this time as patient is on hemodialysis and patient hypertensive at this time. No history of atrial fibrillation.  Nausea vomiting and tachycardia also concerning for acute MI.  She does not have any chest pain, though she did have ST elevations earlier today.  Her troponin initially is 48 in the setting of ESRD and this is lower than previously obtained troponins earlier this year.  Her twelve-lead EKG was significant for sinus tachycardia but no obvious ST elevations appreciated. Leads I & III with PVC.  Other differential for tachycardia includes pulmonary embolism (though no dyspnea or pleuritic pain).  Pneumonia unlikely as chest x-ray within normal limits. -Currently awaiting renal function panel and repeat CBC. -Follow-up blood culture, urine culture -Continue telemetry -Watcher -Hemodialysis either later this evening or early tomorrow morning  Wilber Oliphant, M.D.  5:58 PM 09/20/2019  ADDENDUM : I edited note above to correct that the LEFT eye is her symptomatic eye. I examined her as well and have the same exam findings. I also spoke with her daughter. Urine and blood cultures, CXR, EKG and troponins for trend( elevated troponin value less useful in ESRD but trend may be helpful,or normal value would be reassuring). Given her symptoms of eye, I think we need  to consider re-scan of her head. Dorcas Mcmurray

## 2019-09-20 NOTE — Progress Notes (Signed)
Alerted floor RN Gabrielle Dare that the patient hemodialysis treatment has been moved to 09/21/19

## 2019-09-20 NOTE — Progress Notes (Signed)
[facility] Progress Note  Subjective: no new c/o, feels L arm strength is returning.   Vitals:   09/20/19 0222 09/20/19 0422 09/20/19 0826 09/20/19 1226  BP: (!) 185/82 (!) 188/77 (!) 188/90 (!) 197/114  Pulse: (!) 120 (!) 118 (!) 118 (!) 124  Resp: 15 20 20  (!) 24  Temp: 98.2 F (36.8 C) 98.1 F (36.7 C) 98.4 F (36.9 C) 98.2 F (36.8 C)  TempSrc: Oral Oral Oral Oral  SpO2: 100% 100% 99% 100%  Weight:      Height:        Exam:  alert, no distress  no jvd  Chest cta bilat  Cor reg    Abd soft ntnd no ascites   Ext no sig edema   LUE AVF+bruit    Dialysis: [facility]   4h  400/800  105.5kg  3K/2.25 bath  UFP #3  LUE AVF  Hep 2000 - Hectoral 49mcg Iv q HD - Mircera 25mcg IV q 2 weeks (last 5/10) - Venofer 100 x 10 ordered --> for Hgb 8.5, tsat 7% on 5/10.   Assessment/Plan: 1.  Acute L cerebellar + subcentimeter deep white matter R CVA: Persistent L sided weakness and facial drop, but improving. Echo normal. CTA without carotid or posterior circulation stenoses. Per neuro. Prior CVA on imaging that she was unaware of. Being evaluated for CIR. 2.  ESRD: Continue HD per MWF schedule. HD planned for today. May be bumped to tomorrow given high pt census.  3.  Hypertension/volume: Historically high BP, much better over past year and able to come of meds with improved volume status. On our outpt list she was on home coreg 12.5 bid, but hospital pta med list doesn't have coreg. No meds for now - permissive HTN per neuro.  4.  Anemia (ESRD + IDA): Mircera 225 just given 5/10. Last tsat low - has tolerated Venofer as OP despite Fe allergy - will wait to give IV iron here. 5.  Metabolic bone disease: Ca ok, Phos pending. Resume home binder Lorin Picket) 6.  T2DM 7.  Hyperlipidemia: Prev on statin - off for unknown time, resumed here.  Rob Braylin Xu 09/20/2019, 3:37 PM   Recent Labs  Lab 09/18/19 1227 09/18/19 1227 09/18/19 1236 09/18/19 1236 09/19/19 0359  09/20/19 1324  K 4.2   < > 4.3  --  4.1  --   BUN 29*   < > 31*  --  38*  --   CREATININE 7.97*   < > 9.20*  --  9.52*  --   CALCIUM 9.9  --   --   --  9.9  --   HGB 9.5*   < > 10.5*   < > 8.9* 12.2   < > = values in this interval not displayed.   Inpatient medications: . aspirin EC  81 mg Oral Daily  . atorvastatin  40 mg Oral Daily  . Chlorhexidine Gluconate Cloth  6 each Topical Daily  . clopidogrel  75 mg Oral Daily  . ferric citrate  420 mg Oral TID WC  . heparin  5,000 Units Subcutaneous Q8H  . ondansetron  4 mg Oral Q12H  . sodium chloride flush  3 mL Intravenous Once    acetaminophen **OR** acetaminophen, metoprolol tartrate, polyethylene glycol

## 2019-09-20 NOTE — Plan of Care (Signed)
  Problem: Clinical Measurements: Goal: Will remain free from infection Outcome: Progressing Goal: Diagnostic test results will improve Outcome: Progressing Goal: Respiratory complications will improve Outcome: Progressing   Problem: Activity: Goal: Risk for activity intolerance will decrease Outcome: Progressing   Problem: Coping: Goal: Level of anxiety will decrease Outcome: Progressing   Problem: Pain Managment: Goal: General experience of comfort will improve Outcome: Progressing   

## 2019-09-20 NOTE — Progress Notes (Signed)
   FPTS Interim Progress Note  S:Received page that patient having ST elevations on the monitor.  Patient having nausea but reports she has not vomited since earler this morning.   O: BP (!) 197/114 (BP Location: Right Arm)   Pulse (!) 124   Temp 98.2 F (36.8 C) (Oral)   Resp (!) 24   Ht 5\' 7"  (1.702 m)   Wt 106.9 kg   LMP  (LMP Unknown)   SpO2 100%   BMI 36.91 kg/m    CVS: tachycardic, regular rhythm  RESP: clear to asuscultation  NEURO: alert and oriented to person, place and month and year   A/P: Obtain EKG, XCR and troponins.  Consider switching from Zofran to Compazine if QTc prolonged    Lyndee Hensen, DO 09/20/2019, 2:07 PM PGY-1, [facility] pager [phone]

## 2019-09-20 NOTE — Progress Notes (Signed)
Family Medicine Teaching Service Daily Progress Note Intern Pager: [phone]  Patient name: [name] Medical record number: [id number] Date of birth: 1961-01-07 Age: 59 y.o. Gender: female  Primary Care Provider: Matilde Haymaker, MD Consultants: Neurology, Nephrology Code Status: Full  Pt Overview and Major Events to Date:  Admitted 5/15  Assessment and Plan: [name] is a 59 y.o. female presenting with left-sided weakness and facial droop. PMH is significant for HTN, ESRD, DMT2.   Left-sided weakness 2/2 CVA vs TIA On exam, patient has weakness on the LUE of 4/5 and LLE 5/5 and can better lift both extremities. Per neurology patient to follow-up with stroke clinic NP at [facility] in about 4 weeks.  MRI Brain indicated 1 cm acute infarction in the left cerebellum. Subcentimeter acute infarction in the deep white matter just posterior to the atrium of the right lateral ventricle. ECHO showed EF 55 to 60% but no cardiac source of emboli identified.  Additionally, neuro recommends 30d halter monitor to rule out AFIB as pt had 2 separate strokes.  -Neurology signed off, appreciated recommendations: ASA 81 mg daily and clopidogrel 75 mg daily DAPT for 3 weeks and then plavix alone.  -BP goal: permissive HTN up to 220/110 gradually allow BP to normalize in 5-7d -PT/OT/SLP evals: CIR  -Continuous cardiac monitoring     ESRD Secondary hyperparathyroidism  Anemia CKD   Patient is on HD, MWF schedule. Resume dialysis today as scheduled. Left arm cephalic vein fistula with thrill, started using in January of this year. Patient does not appear fluid overloaded: clear lungs on auscultation, only trace LE edema. - HD per Nephrology  - Continue MWF schedule    HTN Permissive hypertension per neurology to gradually normalize in the next 5-7 days.  Patient is not currently taking any HTN medication, nor coreg. Will continue to monitor. -Permissive HTN to gradually normalize in next 5-7 days  -If BP  >220/110 can consider using IV labetalol    HLD Lipid panel with LDL 127, HDL 47, tot chol 195. Patient has previously been on statin, last prescribed atorvastatin starting in 2017. LDL goal <70 due to CVA. Patient not on statin at home, possibly due to limited evidence of statin benefit dialysis patients. - Continue Atorvastatin   DMT2 Last Hgb A1c in July 2020 was 5.1%, A1c now at 6.7. -monitor on daily BMP   Gastroparesis Patient has cyclical nausea and vomiting, admitted for this in December 2020. Thought to be related to gastroparesis. Follows with Dr. Hilarie Fredrickson, [facility], last seen in February 2021. Patient had prescriptions for both reglan and zofran.  - Monitor for nausea/vomiting  FEN/GI: Renal/carb modified Prophylaxis: Heparin  Disposition: pending CIR acceptance   Subjective:  Patient with intermittent nausea. Denies current pain.   Objective: Temp:  [98 F (36.7 C)-98.5 F (36.9 C)] 98.4 F (36.9 C) (05/17 0826) Pulse Rate:  [94-120] 118 (05/17 0826) Resp:  [15-20] 20 (05/17 0826) BP: (111-188)/(55-94) 188/90 (05/17 0826) SpO2:  [99 %-100 %] 99 % (05/17 0826)  Physical Exam: GEN: pleasant female in no acute distr CV: tachycardic and regular rhythm RESP: no increased work of breathing, clear to ascultation bilaterally ABD: soft, nontender, nondistended.  MSK: able to lift LLE 4+/5 and LUE 4/5, 5/5 RUE and RLE  SKIN: warm, dry NEURO: alert and oriented (person, place, month and year), facial droop     Laboratory: Recent Labs  Lab 09/18/19 1227 09/18/19 1236 09/19/19 0359  WBC 9.6  --  11.1*  HGB  9.5* 10.5* 8.9*  HCT 33.1* 31.0* 31.2*  PLT 468*  --  503*   Recent Labs  Lab 09/18/19 1227 09/18/19 1236 09/19/19 0359  NA 135 134* 137  K 4.2 4.3 4.1  CL 93* 98 96*  CO2 24  --  27  BUN 29* 31* 38*  CREATININE 7.97* 9.20* 9.52*  CALCIUM 9.9  --  9.9  PROT 7.4  --   --   BILITOT 0.7  --   --   ALKPHOS 78  --   --   ALT 9  --   --   AST 10*  --    --   GLUCOSE 173* 170* 83    Imaging/Diagnostic Tests: CT Code Stroke CTA Head W/WO contrast CT Code Stroke CTA Neck W/WO contrast Result Date: 09/18/2019 IMPRESSION: No acute large or medium vessel occlusion. Aortic Atherosclerosis (ICD10-I70.0). Atherosclerotic change at both carotid bifurcations. No stenosis on the right. 10-20% ICA bulb stenosis on the left. Ordinary atherosclerotic change in the carotid siphon regions but without stenosis greater than 30%. Electronically Signed   By: Nelson Chimes M.D.   On: 09/18/2019 12:51   CT HEAD CODE STROKE WO CONTRAST Result Date: 09/18/2019 IMPRESSION: 1. No acute finding. Mild chronic small-vessel change of the hemispheric white matter. 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 2. ASPECTS is 10. 3. These results were communicated to Dr. Leonel Ramsay at 12:38 pmon 5/15/2021by text page via the [facility] messaging system. Electronically Signed   By: Nelson Chimes M.D.   On: 09/18/2019 12:39    Lyndee Hensen, DO 09/20/2019, 8:38 AM PGY-1, [place] Intern pager: [phone], text pages welcome

## 2019-09-20 NOTE — Consult Note (Signed)
Physical Medicine and Rehabilitation Consult Reason for Consult: Left side weakness and facial droop Referring Physician: Family medicine   HPI: [name] is a 59 y.o. right-handed female with history of diabetes mellitus, hypertension, end-stage renal disease with hemodialysis Monday Wednesday Friday.  Per chart review lives with 56 year old son.  Independent prior to admission working full-time at [employer].  1 level home 5 steps to entry.  Multiple family in the area that work..  Presented 09/18/2019 with acute onset of left-sided weakness facial droop and slurred speech.  Cranial CT scan showed no acute findings.  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  CT angiogram head and neck no acute large vessel or medium vessel occlusion.  Patient did not receive TPA.  MRI showed 1 cm acute infarct in the left cerebellum.  Subcentimeter acute infarct in the deep white matter just posterior to the atrium of the right lateral ventricle.  Admission chemistries with BUN 29, creatinine 7.97, hemoglobin 9.5.  Echocardiogram with ejection fraction of 60% no wall motion abnormalities.  Neurology follow-up currently maintained on aspirin and Plavix for CVA prophylaxis x3 weeks then Plavix alone.  Subcutaneous heparin for DVT prophylaxis.  Plan for 30-day cardiac event monitor as outpatient.  Hemodialysis ongoing as per renal services.  Tolerating a regular diet.  Therapy evaluations completed with recommendations of physical medicine rehab consult.   Review of Systems  Constitutional: Negative for chills and fever.  HENT: Negative for hearing loss.   Eyes: Negative for blurred vision and double vision.  Respiratory: Negative for cough and shortness of breath.   Cardiovascular: Positive for leg swelling. Negative for chest pain and palpitations.  Gastrointestinal: Positive for constipation. Negative for heartburn and nausea.       GERD  Genitourinary: Negative for dysuria, flank  pain and hematuria.  Musculoskeletal: Positive for myalgias.  Skin: Negative for rash.  Neurological: Positive for weakness.  All other systems reviewed and are negative.  Past Medical History:  Diagnosis Date  . Accelerated hypertension   . Aortic atherosclerosis ([place])   . Arthritis of knee    bilateral  . Diabetes mellitus    type 2 - no meds  . Diabetic gastroparesis ([place])   . Diverticulosis   . ESRD (end stage renal disease) ([place])   . Fibroid uterus   . GERD (gastroesophageal reflux disease)   . Hyperlipidemia   . Hypertension   . HYPERTENSION, BENIGN SYSTEMIC 07/03/2006        . Hypertensive emergency 02/05/2019  . Hypokalemia   . IDA (iron deficiency anemia)   . Nausea   . Renal disorder   . Umbilical hernia   . Wears dentures    Past Surgical History:  Procedure Laterality Date  . AV FISTULA PLACEMENT Left 02/17/2019   Procedure: ARTERIOVENOUS (AV) FISTULA CREATION LEFT UPPER ARM;  Surgeon: Waynetta Sandy, MD;  Location: [facility];  Service: Vascular;  Laterality: Left;  . CATARACT EXTRACTION     right eye  . CESAREAN SECTION     x2  . CHOLECYSTECTOMY     laparoscopic  . FISTULA SUPERFICIALIZATION Left 04/06/2019   Procedure: FISTULA SUPERFICIALIZATION LEFT BRACHIOCEPHALIC;  Surgeon: Waynetta Sandy, MD;  Location: [facility];  Service: Vascular;  Laterality: Left;  Transposition left arm brachiocephalic fistula.  . IR FLUORO GUIDE CV LINE RIGHT  02/09/2019  . IR FLUORO GUIDE CV LINE RIGHT  03/05/2019  . IR US GUIDE VASC ACCESS RIGHT  02/09/2019  .  MULTIPLE TOOTH EXTRACTIONS    . REDUCTION MAMMAPLASTY Bilateral    Family History  Problem Relation Age of Onset  . Hypertension Mother   . Diabetes Mother   . Cancer Father        lung  . Colon cancer Neg Hx   . Stomach cancer Neg Hx   . Esophageal cancer Neg Hx    Social History:  reports that she quit smoking about 7 years ago. Her smoking use included cigarettes. She quit after 10.00 years of  use. She has never used smokeless tobacco. She reports that she does not drink alcohol or use drugs. Allergies:  Allergies  Allergen Reactions  . Lisinopril Anaphylaxis and Other (See Comments)    angioedema  . Venofer  [Ferric Oxide]     Other reaction(s): Back Pain  . Camellia Swelling and Other (See Comments)    Angioedema   . Jardiance [Empagliflozin] Swelling and Rash   Medications Prior to Admission  Medication Sig Dispense Refill  . Accu-Chek Softclix Lancets lancets Use as instructed (Patient taking differently: 1 each by Other route See admin instructions. Use as instructed) 100 each 12  . acetaminophen (TYLENOL) 500 MG tablet Take 1,000 mg by mouth every 6 (six) hours as needed for moderate pain or headache.    Marland Kitchen aspirin EC 325 MG tablet Take 325 mg by mouth daily.    . B Complex-C-Folic Acid (DIALYVITE 381) 0.8 MG TABS Take 1 tablet by mouth 3 (three) times a week. M, w, f    . Blood Glucose Monitoring Suppl (ACCU-CHEK AVIVA PLUS) w/Device KIT Use to check sugar three times a day (Patient taking differently: 1 each by Other route in the morning, at noon, and at bedtime. ) 1 kit 0  . glucose blood (ACCU-CHEK AVIVA PLUS) test strip Use as instructed (Patient taking differently: 1 each by Other route See admin instructions. Use as instructed) 100 each 12  . Lancets (ACCU-CHEK SOFT TOUCH) lancets Use to check sugars three times a day (Patient taking differently: 1 each by Other route in the morning, at noon, and at bedtime. ) 100 each 12  . metoCLOPramide (REGLAN) 5 MG tablet Take 1 tablet (5 mg total) by mouth 4 (four) times daily -  before meals and at bedtime. (Patient taking differently: Take 5 mg by mouth 3 (three) times daily before meals. ) 120 tablet 2  . ondansetron (ZOFRAN ODT) 4 MG disintegrating tablet Take 1-2 tablets (4-8 mg total) by mouth every 8 (eight) hours as needed for nausea or vomiting. 70 tablet 2  . Vitamin D, Ergocalciferol, (DRISDOL) 1.25 MG (50000 UT) CAPS  capsule Take 50,000 Units by mouth every Monday.       Home: Home Living Family/patient expects to be discharged to:: Private residence Living Arrangements: Children(19 yo son) Available Help at Discharge: Family, Available PRN/intermittently Type of Home: Apartment Home Access: Stairs to enter Technical brewer of Steps: 5 Entrance Stairs-Rails: Right, Left, Can reach both Home Layout: One level Bathroom Shower/Tub: Tub/shower unit, Architectural technologist: Standard Home Equipment: None  Lives With: Son  Functional History: Prior Function Level of Independence: Independent Comments: denies falls in past 6 months; 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[employer] Functional Status:  Mobility: Bed Mobility Overal bed mobility: Needs Assistance Bed Mobility: Supine to Sit Supine to sit: Min guard General bed mobility comments: pt with difficulty manipulating linens to get LLE uncovered; slow, effortful to get LLE over EOB and to raise torso to sitting (near need for  assist) Transfers Overall transfer level: Needs assistance Equipment used: 1 person hand held assist Transfers: Sit to/from Stand Sit to Stand: Min assist General transfer comment: pt needed bil UE assist to push to stand with pt hesitant due to fear of LLE giving away (it did not) Ambulation/Gait Ambulation/Gait assistance: Min assist Gait Distance (Feet): 10 Feet Assistive device: 1 person hand held assist(2nd hand holding onto end of bed when able) Gait Pattern/deviations: Step-to pattern, Decreased step length - left, Decreased stance time - left, Decreased dorsiflexion - left, Decreased weight shift to left, Steppage General Gait Details: difficulty advancing LLE due to foot drop and hip flexor weakness (plus gripper socks); vc for focus on reaching left heel forward to encourage Lt DF, however then needed cues for knee/hip flexion to allow left foot to clear and advance Gait velocity: very slow Gait velocity  interpretation: <1.31 ft/sec, indicative of household ambulator    ADL:    Cognition: Cognition Overall Cognitive Status: Impaired/Different from baseline Arousal/Alertness: Awake/alert Orientation Level: Oriented X4 Attention: Focused, Sustained Focused Attention: Impaired Focused Attention Impairment: Verbal complex(Backward digit span: 1/2) Sustained Attention: Impaired Sustained Attention Impairment: Verbal complex Memory: Impaired Memory Impairment: Storage deficit, Decreased recall of new information, Retrieval deficit(Immediate: 3/5; delayed: 0/5 with cues: 1/5) Awareness: Impaired Awareness Impairment: Emergent impairment Problem Solving: Impaired Problem Solving Impairment: Verbal complex Executive Function: Sequencing Sequencing: Impaired Sequencing Impairment: Verbal complex(Clock drawing: 2/4) Cognition Arousal/Alertness: Awake/alert Behavior During Therapy: Flat affect Overall Cognitive Status: Impaired/Different from baseline  Blood pressure (!) 188/77, pulse (!) 118, temperature 98.1 F (36.7 C), temperature source Oral, resp. rate 20, height '5\' 7"'$  (1.702 m), weight 106.9 kg, SpO2 100 %. Physical Exam  Eyes: Pupils are equal, round, and reactive to light.  Cardiovascular: Normal rate.  Respiratory: Effort normal.  GI: Soft. She exhibits no distension.  Vomited while I was in room  Musculoskeletal:        General: No edema.     Cervical back: Normal range of motion.  Neurological:  Patient is alert in no acute distress.  Makes eye contact with examiner follows simple commands.  Speech is a bit delayed but provides name age and date of birth. Moves all 4's but limited in effort d/t severe nausea/vomiting    No results found for this or any previous visit (from the past 24 hour(s)). CT Code Stroke CTA Head W/WO contrast  Result Date: 09/18/2019 CLINICAL DATA:  Acute presentation with left sided weakness and left facial droop. Slurred speech. EXAM: CT  ANGIOGRAPHY HEAD AND NECK TECHNIQUE: Multidetector CT imaging of the head and neck was performed using the standard protocol during bolus administration of intravenous contrast. Multiplanar CT image reconstructions and MIPs were obtained to evaluate the vascular anatomy. Carotid stenosis measurements (when applicable) are obtained utilizing NASCET criteria, using the distal internal carotid diameter as the denominator. CONTRAST:  Not annotated. COMPARISON:  Head CT same day FINDINGS: CTA NECK FINDINGS Aortic arch: Aortic atherosclerosis. Branching pattern is normal without origin stenosis. Right carotid system: Common carotid artery widely patent to the bifurcation. Minimal atherosclerotic plaque at the carotid bifurcation and ICA bulb but no stenosis. Cervical ICA widely patent. Left carotid system: Common carotid artery widely patent to the bifurcation. Mild atherosclerotic plaque at the ICA bulb. Minimal diameter 3.5 mm. Compared to a more distal cervical ICA diameter of 4 mm, this indicates a 10-20% stenosis. Vertebral arteries: Both vertebral arteries are patent through the cervical region. Right vertebral artery takes an early origin from the proximal  subclavian. Skeleton: Ordinary mid cervical spondylosis. Other neck: No mass or lymphadenopathy. Upper chest: Normal Review of the MIP images confirms the above findings CTA HEAD FINDINGS Anterior circulation: Both internal carotid arteries are patent through the skull base and siphon regions. There is mild atherosclerotic calcification in the carotid siphon regions but no stenosis greater than 30%. The anterior and middle cerebral vessels are patent without proximal stenosis, aneurysm or vascular malformation. No large or medium vessel occlusion. Posterior circulation: Both vertebral arteries widely patent to the basilar. No basilar stenosis. Posterior circulation branch vessels are normal. Patent posterior communicating arteries. Venous sinuses: Normal Anatomic  variants: None Review of the MIP images confirms the above findings IMPRESSION: No acute large or medium vessel occlusion. Aortic Atherosclerosis (ICD10-I70.0). Atherosclerotic change at both carotid bifurcations. No stenosis on the right. 10-20% ICA bulb stenosis on the left. Ordinary atherosclerotic change in the carotid siphon regions but without stenosis greater than 30%. Electronically Signed   By: Nelson Chimes M.D.   On: 09/18/2019 12:51   CT Code Stroke CTA Neck W/WO contrast  Result Date: 09/18/2019 CLINICAL DATA:  Acute presentation with left sided weakness and left facial droop. Slurred speech. EXAM: CT ANGIOGRAPHY HEAD AND NECK TECHNIQUE: Multidetector CT imaging of the head and neck was performed using the standard protocol during bolus administration of intravenous contrast. Multiplanar CT image reconstructions and MIPs were obtained to evaluate the vascular anatomy. Carotid stenosis measurements (when applicable) are obtained utilizing NASCET criteria, using the distal internal carotid diameter as the denominator. CONTRAST:  Not annotated. COMPARISON:  Head CT same day FINDINGS: CTA NECK FINDINGS Aortic arch: Aortic atherosclerosis. Branching pattern is normal without origin stenosis. Right carotid system: Common carotid artery widely patent to the bifurcation. Minimal atherosclerotic plaque at the carotid bifurcation and ICA bulb but no stenosis. Cervical ICA widely patent. Left carotid system: Common carotid artery widely patent to the bifurcation. Mild atherosclerotic plaque at the ICA bulb. Minimal diameter 3.5 mm. Compared to a more distal cervical ICA diameter of 4 mm, this indicates a 10-20% stenosis. Vertebral arteries: Both vertebral arteries are patent through the cervical region. Right vertebral artery takes an early origin from the proximal subclavian. Skeleton: Ordinary mid cervical spondylosis. Other neck: No mass or lymphadenopathy. Upper chest: Normal Review of the MIP images  confirms the above findings CTA HEAD FINDINGS Anterior circulation: Both internal carotid arteries are patent through the skull base and siphon regions. There is mild atherosclerotic calcification in the carotid siphon regions but no stenosis greater than 30%. The anterior and middle cerebral vessels are patent without proximal stenosis, aneurysm or vascular malformation. No large or medium vessel occlusion. Posterior circulation: Both vertebral arteries widely patent to the basilar. No basilar stenosis. Posterior circulation branch vessels are normal. Patent posterior communicating arteries. Venous sinuses: Normal Anatomic variants: None Review of the MIP images confirms the above findings IMPRESSION: No acute large or medium vessel occlusion. Aortic Atherosclerosis (ICD10-I70.0). Atherosclerotic change at both carotid bifurcations. No stenosis on the right. 10-20% ICA bulb stenosis on the left. Ordinary atherosclerotic change in the carotid siphon regions but without stenosis greater than 30%. Electronically Signed   By: Nelson Chimes M.D.   On: 09/18/2019 12:51   MR BRAIN WO CONTRAST  Result Date: 09/19/2019 CLINICAL DATA:  Left arm and leg weakness with facial droop. Acute presentation yesterday. Negative CT evaluation. EXAM: MRI HEAD WITHOUT CONTRAST TECHNIQUE: Multiplanar, multiecho pulse sequences of the brain and surrounding structures were obtained without intravenous contrast. COMPARISON:  CT  studies 09/18/2019 FINDINGS: Brain: Diffusion imaging shows a 1 cm acute to subacute infarction in the left cerebellum. Chronic small-vessel ischemic changes are present throughout the pons. Cerebral hemispheres show a subcentimeter acute infarction in the white matter posterior to the atrium of the right lateral ventricle. Chronic small-vessel ischemic changes are seen extensively throughout the cerebral hemispheric deep and subcortical white matter and basal ganglia. There is an old right frontal cortical and  subcortical infarction. No mass lesion, hemorrhage, hydrocephalus or extra-axial collection. One could consider the possibility of coexistence demyelinating disease, but in a patient with diabetes, hypertension and hyperlipidemia, findings are presumed to represent small vessel disease. Vascular: Major vessels at the base of the brain show flow. Skull and upper cervical spine: Negative Sinuses/Orbits: Clear/normal Other: None IMPRESSION: Background pattern of extensive chronic small-vessel ischemic change throughout the brain as outlined above. 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 1 cm acute infarction in the left cerebellum. Subcentimeter acute infarction in the deep white matter just posterior to the atrium of the right lateral ventricle. Neither is associated with hemorrhage or mass effect. Electronically Signed   By: Nelson Chimes M.D.   On: 09/19/2019 07:49   ECHOCARDIOGRAM COMPLETE  Result Date: 09/19/2019    ECHOCARDIOGRAM REPORT   Patient Name:   [name] Date of Exam: 09/19/2019 Medical Rec #:  [id number]    Height:       67.0 in Accession #:    [id number]   Weight:       235.7 lb Date of Birth:  [birth year]    BSA:          2.168 m Patient Age:    58 years     BP:           119/66 mmHg Patient Gender: F            HR:           89 bpm. Exam Location:  Inpatient Procedure: 2D Echo, Cardiac Doppler and Color Doppler Indications:    Stroke 434.91/I163.9  History:        Patient has prior history of Echocardiogram examinations, most                 recent 06/22/2019. Risk Factors:Hypertension, Diabetes, Former                 Smoker, Dyslipidemia and Sleep Apnea. ESRD.  Sonographer:    Clayton Lefort RDCS (AE) Referring Phys: [place]  1. Left ventricular ejection fraction, by estimation, is 55 to 60%. The left ventricle has normal function. The left ventricle has no regional wall motion abnormalities. There is moderate asymmetric left ventricular hypertrophy of the basal  and septal segments. Left ventricular diastolic parameters are consistent with Grade I diastolic dysfunction (impaired relaxation).  2. Right ventricular systolic function is normal. The right ventricular size is normal. There is normal pulmonary artery systolic pressure.  3. The mitral valve is normal in structure. No evidence of mitral valve regurgitation. No evidence of mitral stenosis.  4. The aortic valve is tricuspid. Aortic valve regurgitation is not visualized. Mild to moderate aortic valve sclerosis/calcification is present, without any evidence of aortic stenosis.  5. The inferior vena cava is normal in size with greater than 50% respiratory variability, suggesting right atrial pressure of 3 mmHg. FINDINGS  Left Ventricle: Left ventricular ejection fraction, by estimation, is 55 to 60%. The left ventricle has normal function. The left ventricle has no regional wall  motion abnormalities. The left ventricular internal cavity size was normal in size. There is  moderate asymmetric left ventricular hypertrophy of the basal and septal segments. Left ventricular diastolic parameters are consistent with Grade I diastolic dysfunction (impaired relaxation). Right Ventricle: The right ventricular size is normal. No increase in right ventricular wall thickness. Right ventricular systolic function is normal. There is normal pulmonary artery systolic pressure. The tricuspid regurgitant velocity is 2.56 m/s, and  with an assumed right atrial pressure of 8 mmHg, the estimated right ventricular systolic pressure is 32.9 mmHg. Left Atrium: Left atrial size was normal in size. Right Atrium: Right atrial size was normal in size. Pericardium: There is no evidence of pericardial effusion. Mitral Valve: The mitral valve is normal in structure. There is mild thickening of the mitral valve leaflet(s). There is mild calcification of the mitral valve leaflet(s). Normal mobility of the mitral valve leaflets. Moderate mitral annular  calcification. No evidence of mitral valve regurgitation. No evidence of mitral valve stenosis. MV peak gradient, 4.8 mmHg. The mean mitral valve gradient is 2.0 mmHg. Tricuspid Valve: The tricuspid valve is normal in structure. Tricuspid valve regurgitation is mild . No evidence of tricuspid stenosis. Aortic Valve: The aortic valve is tricuspid. Aortic valve regurgitation is not visualized. Mild to moderate aortic valve sclerosis/calcification is present, without any evidence of aortic stenosis. Aortic valve mean gradient measures 4.0 mmHg. Aortic valve peak gradient measures 8.0 mmHg. Aortic valve area, by VTI measures 3.44 cm. Pulmonic Valve: The pulmonic valve was normal in structure. Pulmonic valve regurgitation is not visualized. No evidence of pulmonic stenosis. Aorta: The aortic root is normal in size and structure. Venous: The inferior vena cava is normal in size with greater than 50% respiratory variability, suggesting right atrial pressure of 3 mmHg. IAS/Shunts: No atrial level shunt detected by color flow Doppler.  LEFT VENTRICLE PLAX 2D LVIDd:         4.00 cm  Diastology LVIDs:         2.70 cm  LV e' lateral:   5.55 cm/s LV PW:         1.80 cm  LV E/e' lateral: 14.1 LV IVS:        1.80 cm  LV e' medial:    4.79 cm/s LVOT diam:     2.30 cm  LV E/e' medial:  16.4 LV SV:         91 LV SV Index:   42 LVOT Area:     4.15 cm  RIGHT VENTRICLE             IVC RV Basal diam:  3.50 cm     IVC diam: 1.10 cm RV S prime:     11.50 cm/s TAPSE (M-mode): 2.1 cm LEFT ATRIUM             Index       RIGHT ATRIUM           Index LA diam:        3.20 cm 1.48 cm/m  RA Area:     15.50 cm LA Vol (A2C):   75.0 ml 34.59 ml/m RA Volume:   41.70 ml  19.23 ml/m LA Vol (A4C):   47.9 ml 22.09 ml/m LA Biplane Vol: 63.1 ml 29.10 ml/m  AORTIC VALVE AV Area (Vmax):    3.06 cm AV Area (Vmean):   3.02 cm AV Area (VTI):     3.44 cm AV Vmax:  141.00 cm/s AV Vmean:          94.900 cm/s AV VTI:            0.263 m AV Peak  Grad:      8.0 mmHg AV Mean Grad:      4.0 mmHg LVOT Vmax:         104.00 cm/s LVOT Vmean:        69.000 cm/s LVOT VTI:          0.218 m LVOT/AV VTI ratio: 0.83  AORTA Ao Root diam: 3.50 cm Ao Asc diam:  3.10 cm MITRAL VALVE                TRICUSPID VALVE MV Area (PHT): 1.25 cm     TR Peak grad:   26.2 mmHg MV Peak grad:  4.8 mmHg     TR Vmax:        256.00 cm/s MV Mean grad:  2.0 mmHg MV Vmax:       1.09 m/s     SHUNTS MV Vmean:      69.7 cm/s    Systemic VTI:  0.22 m MV Decel Time: 607 msec     Systemic Diam: 2.30 cm MV E velocity: 78.50 cm/s MV A velocity: 108.00 cm/s MV E/A ratio:  0.73 Jenkins Rouge MD Electronically signed by Jenkins Rouge MD Signature Date/Time: 09/19/2019/10:12:49 AM    Final    CT HEAD CODE STROKE WO CONTRAST  Result Date: 09/18/2019 CLINICAL DATA:  Code stroke. Acute stroke with left-sided weakness and left facial droop. Slurred speech. EXAM: CT HEAD WITHOUT CONTRAST TECHNIQUE: Contiguous axial images were obtained from the base of the skull through the vertex without intravenous contrast. COMPARISON:  01/18/2012. FINDINGS: Brain: Chronic small-vessel ischemic changes of the hemispheric white matter. 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 No sign of acute infarction, mass lesion, hemorrhage, hydrocephalus or extra-axial collection. Vascular: There is atherosclerotic calcification of the major vessels at the base of the brain. Skull: Negative Sinuses/Orbits: Clear/normal Other: None ASPECTS ([place] Stroke Program Early CT Score) - Ganglionic level infarction (caudate, lentiform nuclei, internal capsule, insula, M1-M3 cortex): 7 - Supraganglionic infarction (M4-M6 cortex): 3 Total score (0-10 with 10 being normal): 10 IMPRESSION: 1. No acute finding. Mild chronic small-vessel change of the hemispheric white matter. 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 2. ASPECTS is 10. 3. These results were communicated to Dr. Leonel Ramsay at 12:38 pmon 5/15/2021by text  page via the [facility] messaging system. Electronically Signed   By: Nelson Chimes M.D.   On: 09/18/2019 12:39     Assessment/Plan: Diagnosis: left cerebellar infarct, right DWM infarct behind right lateral ventricle d/t small vessel disease. 1. Does the need for close, 24 hr/day medical supervision in concert with the patient's rehab needs make it unreasonable for this patient to be served in a less intensive setting? Yes 2. Co-Morbidities requiring supervision/potential complications: HTN, DM, ESRD on HD, diabetic gastroparesis 3. Due to bladder management, bowel management, safety, skin/wound care, disease management, medication administration, pain management and patient education, does the patient require 24 hr/day rehab nursing? Yes 4. Does the patient require coordinated care of a physician, rehab nurse, therapy disciplines of PT, OT, SLP to address physical and functional deficits in the context of the above medical diagnosis(es)? Yes Addressing deficits in the following areas: balance, endurance, locomotion, strength, transferring, bowel/bladder control, bathing, dressing, feeding, grooming, toileting, cognition, speech and psychosocial support 5. Can the patient actively participate in an intensive therapy  program of at least 3 hrs of therapy per day at least 5 days per week? Yes 6. The potential for patient to make measurable gains while on inpatient rehab is excellent 7. Anticipated functional outcomes upon discharge from inpatient rehab are modified independent and supervision  with PT, modified independent and supervision with OT, modified independent and supervision with SLP. 8. Estimated rehab length of stay to reach the above functional goals is: 9-13 days 9. Anticipated discharge destination: Home 10. Overall Rehab/Functional Prognosis: excellent  RECOMMENDATIONS: This patient's condition is appropriate for continued rehabilitative care in the following setting: CIR Patient has  agreed to participate in recommended program. Yes Note that insurance prior authorization may be required for reimbursement for recommended care.  Comment: Rehab Admissions Coordinator to follow up.  Thanks,  Meredith Staggers, MD, Mellody Drown  I have personally performed a face to face diagnostic evaluation of this patient. Additionally, I have examined pertinent labs and radiographic images. I have reviewed and concur with the physician assistant's documentation above.    Lavon Paganini Angiulli, PA-C 09/20/2019

## 2019-09-20 NOTE — Progress Notes (Signed)
Neurology recommending holter monitoring after discharge. Patient does not have an established cardiologist. Spoke to [facility] office for further information. We will place order for Zio monitor and will be sent to patient's home with instructions for use. [facility] does not put them on in office any longer due to [place]. Results will be sent to PCP/ordering provider.    Wilber Oliphant, M.D.  8:57 AM 09/20/2019

## 2019-09-20 NOTE — Progress Notes (Addendum)
Inpatient Rehabilitation Admissions Coordinator  Patient receiving CXR. I await OT eval to be able to begin insurance authorization with [facility] for a possible inpt rehab admission. I will follow up in the morning.  Danne Baxter, RN, MSN Rehab Admissions Coordinator [phone] 09/20/2019 3:13 PM   Patient sleeping, I met with patient's daughter at bedside to discuss rehab goals and expectations. She is in agreement. I will follow.  Danne Baxter, RN, MSN Rehab Admissions Coordinator [phone] 09/20/2019 4:28 PM

## 2019-09-21 DIAGNOSIS — N186 End stage renal disease: Secondary | ICD-10-CM

## 2019-09-21 DIAGNOSIS — E119 Type 2 diabetes mellitus without complications: Secondary | ICD-10-CM

## 2019-09-21 DIAGNOSIS — D72829 Elevated white blood cell count, unspecified: Secondary | ICD-10-CM

## 2019-09-21 LAB — CBC WITH DIFFERENTIAL/PLATELET
Abs Immature Granulocytes: 0.11 10*3/uL — ABNORMAL HIGH (ref 0.00–0.07)
Basophils Absolute: 0.1 10*3/uL (ref 0.0–0.1)
Basophils Relative: 0 %
Eosinophils Absolute: 0 10*3/uL (ref 0.0–0.5)
Eosinophils Relative: 0 %
HCT: 40 % (ref 36.0–46.0)
Hemoglobin: 11.4 g/dL — ABNORMAL LOW (ref 12.0–15.0)
Immature Granulocytes: 1 %
Lymphocytes Relative: 9 %
Lymphs Abs: 1.6 10*3/uL (ref 0.7–4.0)
MCH: 23.3 pg — ABNORMAL LOW (ref 26.0–34.0)
MCHC: 28.5 g/dL — ABNORMAL LOW (ref 30.0–36.0)
MCV: 81.6 fL (ref 80.0–100.0)
Monocytes Absolute: 0.8 10*3/uL (ref 0.1–1.0)
Monocytes Relative: 5 %
Neutro Abs: 14.9 10*3/uL — ABNORMAL HIGH (ref 1.7–7.7)
Neutrophils Relative %: 85 %
Platelets: 632 10*3/uL — ABNORMAL HIGH (ref 150–400)
RBC: 4.9 MIL/uL (ref 3.87–5.11)
RDW: 18.6 % — ABNORMAL HIGH (ref 11.5–15.5)
WBC: 17.4 10*3/uL — ABNORMAL HIGH (ref 4.0–10.5)
nRBC: 0.4 % — ABNORMAL HIGH (ref 0.0–0.2)

## 2019-09-21 LAB — CBC
HCT: 39.7 % (ref 36.0–46.0)
Hemoglobin: 11.1 g/dL — ABNORMAL LOW (ref 12.0–15.0)
MCH: 22.8 pg — ABNORMAL LOW (ref 26.0–34.0)
MCHC: 28 g/dL — ABNORMAL LOW (ref 30.0–36.0)
MCV: 81.7 fL (ref 80.0–100.0)
Platelets: 636 10*3/uL — ABNORMAL HIGH (ref 150–400)
RBC: 4.86 MIL/uL (ref 3.87–5.11)
RDW: 18.5 % — ABNORMAL HIGH (ref 11.5–15.5)
WBC: 16.9 10*3/uL — ABNORMAL HIGH (ref 4.0–10.5)
nRBC: 0.2 % (ref 0.0–0.2)

## 2019-09-21 LAB — RENAL FUNCTION PANEL
Albumin: 3.7 g/dL (ref 3.5–5.0)
Anion gap: 21 — ABNORMAL HIGH (ref 5–15)
BUN: 70 mg/dL — ABNORMAL HIGH (ref 6–20)
CO2: 23 mmol/L (ref 22–32)
Calcium: 9.8 mg/dL (ref 8.9–10.3)
Chloride: 95 mmol/L — ABNORMAL LOW (ref 98–111)
Creatinine, Ser: 14.17 mg/dL — ABNORMAL HIGH (ref 0.44–1.00)
GFR calc Af Amer: 3 mL/min — ABNORMAL LOW (ref >60)
GFR calc non Af Amer: 2 mL/min — ABNORMAL LOW (ref >60)
Glucose, Bld: 129 mg/dL — ABNORMAL HIGH (ref 70–99)
Phosphorus: 5.8 mg/dL — ABNORMAL HIGH (ref 2.5–4.6)
Potassium: 4.7 mmol/L (ref 3.5–5.1)
Sodium: 139 mmol/L (ref 135–145)

## 2019-09-21 MED ORDER — LIDOCAINE-PRILOCAINE 2.5-2.5 % EX CREA: 1.0000 "application " | TOPICAL_CREAM | CUTANEOUS | Status: DC | PRN

## 2019-09-21 MED ORDER — LIDOCAINE HCL (PF) 1 % IJ SOLN: 5.0000 mL | INTRAMUSCULAR | Status: DC | PRN

## 2019-09-21 MED ORDER — PENTAFLUOROPROP-TETRAFLUOROETH EX AERO: 1.0000 "application " | INHALATION_SPRAY | CUTANEOUS | Status: DC | PRN

## 2019-09-21 MED ORDER — SODIUM CHLORIDE 0.9 % IV SOLN: 100.0000 mL | INTRAVENOUS | Status: DC | PRN

## 2019-09-21 MED ORDER — CHLORHEXIDINE GLUCONATE CLOTH 2 % EX PADS: 6.0000 | MEDICATED_PAD | Freq: Every day | CUTANEOUS | Status: DC

## 2019-09-21 NOTE — Progress Notes (Signed)
Family Medicine Teaching Service Daily Progress Note Intern Pager: [phone]  Patient name: [name] Medical record number: [id number] Date of birth: 12-Oct-1960 Age: 59 y.o. Gender: female  Primary Care Provider: Matilde Haymaker, MD Consultants: Neurology, Nephrology Code Status: Full  Pt Overview and Major Events to Date:  Admitted 5/15  Assessment and Plan: [name] is a 59 y.o. female presenting with left-sided weakness and facial droop. PMH is significant for HTN, ESRD, DMT2.   Left-sided weakness 2/2 CVA vs TIA Patient reported creasing left eye pressure nausea.  She was also tachycardic.  MRI brain obtained no significant change from prior MRI.  No acute abnormality seen.  Per neurology, patient to follow-up with [facility] in about 4 weeks.  Cardiology consulted regarding recommendation for 30-day heart Holter monitor.  There is concern for A. fib patient had 2 separate strokes.  Cardiology to set up to monitor in the outpatient setting.  -Neurology signed off, appreciated recommendations: ASA 81 mg daily and clopidogrel 75 mg daily DAPT for 3 weeks and then plavix alone.  -BP goal: permissive HTN up to 220/110 gradually allow BP to normalize in 5-7d -PT/OT/SLP evals: CIR  -Continuous cardiac monitoring -Follow-up on urine culture, blood culture    ESRD Secondary hyperparathyroidism  Anemia CKD   Patient is on HD, MWF schedule. Did not have dialysis yesterday as scheduled.  Seen in dialysis today.  Left arm cephalic vein fistula with thrill, started using in January of this year.  - HD per Nephrology     HTN Permissive hypertension per neurology to gradually normalize in the next 5-7 days.  Patient is not currently taking any HTN medication, nor coreg. Will continue to monitor. -Permissive HTN to gradually normalize in next 5-7 days  -If BP >220/110 can consider using IV labetalol    HLD Lipid panel with LDL 127, HDL 47, tot chol 195. Patient has previously been on statin,  last prescribed atorvastatin starting in 2017. LDL goal <70 due to CVA. Patient not on statin at home, possibly due to limited evidence of statin benefit dialysis patients. - Continue Atorvastatin   DMT2 Last Hgb A1c in July 2020 was 5.1%, A1c now at 6.7. -monitor on daily BMP   Gastroparesis Patient has cyclical nausea and vomiting, admitted for this in December 2020. Thought to be related to gastroparesis. Follows with Dr. Hilarie Fredrickson, [facility], last seen in February 2021. Patient had prescriptions for both reglan and zofran.  - Monitor for nausea/vomiting  FEN/GI: Renal/carb modified Prophylaxis: Heparin  Disposition: pending CIR acceptance   Subjective:  Patient with two episodes of vomiting yesterday.  Reports improvement in nausea.  Denies CP, SOB, dysuria. Makes some urine about twice a day.  Says that her left eye pressure has improved.   Objective: Temp:  [97.8 F (36.6 C)-98.5 F (36.9 C)] 98.5 F (36.9 C) (05/18 0336) Pulse Rate:  [77-128] 118 (05/18 0336) Resp:  [18-24] 18 (05/18 0336) BP: (157-217)/(90-114) 157/96 (05/18 0336) SpO2:  [99 %-100 %] 100 % (05/18 0336)  Physical Exam: GEN: seen in HD, in no acute distress  CV: regular rate and rhythm, no murmurs appreciated  RESP: no increased work of breathing, clear to ascultation bilaterally  ABD: Soft, Nontender, Nondistended.  MSK: no appreciate LE edema      Laboratory: Recent Labs  Lab 09/19/19 0359 09/20/19 1324 09/21/19 0240  WBC 11.1* 14.9* 17.4*  16.9*  HGB 8.9* 12.2 11.4*  11.1*  HCT 31.2* 42.8 40.0  39.7  PLT 503*  582* 632*  636*   Recent Labs  Lab 09/18/19 1227 09/18/19 1227 09/18/19 1236 09/19/19 0359 09/21/19 0240  NA 135   < > 134* 137 139  K 4.2   < > 4.3 4.1 4.7  CL 93*   < > 98 96* 95*  CO2 24  --   --  27 23  BUN 29*   < > 31* 38* 70*  CREATININE 7.97*   < > 9.20* 9.52* 14.17*  CALCIUM 9.9  --   --  9.9 9.8  PROT 7.4  --   --   --   --   BILITOT 0.7  --   --   --   --    ALKPHOS 78  --   --   --   --   ALT 9  --   --   --   --   AST 10*  --   --   --   --   GLUCOSE 173*   < > 170* 83 129*   < > = values in this interval not displayed.    Imaging/Diagnostic Tests: CT Code Stroke CTA Head W/WO contrast CT Code Stroke CTA Neck W/WO contrast Result Date: 09/18/2019 IMPRESSION: No acute large or medium vessel occlusion. Aortic Atherosclerosis (ICD10-I70.0). Atherosclerotic change at both carotid bifurcations. No stenosis on the right. 10-20% ICA bulb stenosis on the left. Ordinary atherosclerotic change in the carotid siphon regions but without stenosis greater than 30%. Electronically Signed   By: Nelson Chimes M.D.   On: 09/18/2019 12:51   CT HEAD CODE STROKE WO CONTRAST Result Date: 09/18/2019 IMPRESSION: 1. No acute finding. Mild chronic small-vessel change of the hemispheric white matter. 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 2. ASPECTS is 10. 3. These results were communicated to Dr. Leonel Ramsay at 12:38 pmon 5/15/2021by text page via the [facility] messaging system. Electronically Signed   By: Nelson Chimes M.D.   On: 09/18/2019 12:39    Lyndee Hensen, DO 09/21/2019, 8:23 AM PGY-1, [place] Intern pager: [phone], text pages welcome

## 2019-09-21 NOTE — Progress Notes (Signed)
Patient returned to the unit from HD, full set of vitals taken, MEWS score in the yellow patient MEWS score has been in the yellow for more than 24 hours. MD is aware of patient status, will continue to monitor vital signs Q4hrs at this time.

## 2019-09-21 NOTE — Plan of Care (Signed)
  Problem: Activity: Goal: Risk for activity intolerance will decrease Outcome: Progressing   Problem: Nutrition: Goal: Adequate nutrition will be maintained Outcome: Progressing   Problem: Coping: Goal: Level of anxiety will decrease Outcome: Progressing   Problem: Pain Managment: Goal: General experience of comfort will improve Outcome: Progressing   Problem: Safety: Goal: Ability to remain free from injury will improve Outcome: Progressing   Problem: Coping: Goal: Will verbalize positive feelings about self Outcome: Progressing

## 2019-09-21 NOTE — Progress Notes (Signed)
[facility] Progress Note  Subjective: seen on HD, no new c/o's.   Vitals:   09/21/19 1000 09/21/19 1019 09/21/19 1104 09/21/19 1229  BP: (!) 174/77 (!) 182/72 (!) 153/73 (!) 177/79  Pulse: (!) 125 (!) 121 (!) 120 (!) 124  Resp:  18 (!) 24 (!) 22  Temp:  97.9 F (36.6 C) 97.7 F (36.5 C) 98.2 F (36.8 C)  TempSrc:  Oral Oral Oral  SpO2:  100% 100% 100%  Weight:  103.5 kg    Height:        Exam: alert, no distress  no jvd  Chest cta bilat  Cor reg    Abd soft ntnd no ascites   Ext no sig edema   LUE AVF+bruit    Dialysis: [facility]   4h  400/800  105.5kg  3K/2.25 bath  UFP #3  LUE AVF  Hep 2000 - Hectoral 43mcg Iv q HD - Mircera 224mcg IV q 2 weeks (last 5/10) - Venofer 100 x 10 ordered --> for Hgb 8.5, tsat 7% on 5/10.   Assessment/Plan: 1. Acute L cerebellar + subcentimeter deep white matter R ATF:[id number] L sided weakness and facial drop, but improving. Echo normal. CTA without carotid or posterior circulation stenoses. Prior CVA on imaging that she was unaware of. Being evaluated for CIR. 2. ESRD:Continue HD per MWF schedule. HD today off schedule then again tomorrow.  3. Hypertension/volume:2kg under dry wt. allowign permissive HTN w/ acute CVA. On our outpt list she was on home coreg 12.5 bid, but hospital pta med list doesn't have coreg. No meds for now. Will lower vol a bit more as tol.   4. Anemia(ESRD + IDA):Mircera 225 just given 5/10. Last tsat low - has tolerated Venofer as OP despite Fe allergy - will wait to give IV iron here. 5. Metabolic bone disease:Ca ok, Phos pending. Resume home binder Lorin Picket) 6. T2DM 7. Hyperlipidemia: Prev on statin - off for unknown time, resumed here.    Rob Schertz 09/21/2019, 1:10 PM   Recent Labs  Lab 09/19/19 0359 09/19/19 0359 09/20/19 1324 09/21/19 0240  K 4.1  --   --  4.7  BUN 38*  --   --  70*  CREATININE 9.52*  --   --  14.17*  CALCIUM 9.9  --   --  9.8  PHOS  --   --    --  5.8*  HGB 8.9*   < > 12.2 11.4*  11.1*   < > = values in this interval not displayed.   Inpatient medications: . aspirin EC  81 mg Oral Daily  . atorvastatin  40 mg Oral Daily  . Chlorhexidine Gluconate Cloth  6 each Topical Daily  . clopidogrel  75 mg Oral Daily  . ferric citrate  420 mg Oral TID WC  . heparin  5,000 Units Subcutaneous Q8H  . ondansetron  4 mg Oral Q12H  . sodium chloride flush  3 mL Intravenous Once    acetaminophen **OR** acetaminophen, metoprolol tartrate, polyethylene glycol

## 2019-09-21 NOTE — Progress Notes (Signed)
PT Cancellation Note  Patient Details Name: [name] MRN: [id number] DOB: [birth year]   Cancelled Treatment:    Reason Eval/Treat Not Completed: Medical issues which prohibited therapy  Patient again very nauseated. Feels she is about to throw up and holding emesis bag. RN notified.    [name], PT Pager [phone]   [name] 09/21/2019, 3:58 PM

## 2019-09-21 NOTE — Evaluation (Addendum)
Occupational Therapy Evaluation Patient Details Name: [name] MRN: [id number] DOB: [birth year] Today's Date: 09/21/2019    History of Present Illness 59 y.o. female presenting with left-sided weakness and facial droop. PMH is significant for HTN, ESRD with HD MWF, DMT2. CT head showed no hemorrhage, CTA showed no LVO or significant stenosis. Patient was offered tPA by neurology, but declined. MRI brain 1 cm acute infarction in the left cerebellum. Subcentimeter acute infarction in the deep white matter just posterior to the atrium of the right lateral ventricle. Old rt frontal infarct   Clinical Impression   PTA patient independent and working.  Admitted for above and limited by problem list below, including L sided weakness and decreased gross motor coordination, impaired balance, dizziness with mobilization, decreased activity tolerance, blurry vision, and mild L inattention (using L UE functionally during ADLS).  Pt pleasant and oriented, follows commands with increased time, noted decreased awareness to LUE and to deficits. Pt HR ranged from 123-132 throughout session, RN aware.  Pt requires min assist for sit to stand (unable to complete full stand) and poor tolerance due to dizziness, ADLs with min-max assist from EOB.  Initiated education on gaze stabilization. She will benefit from continued OT services while admitted and will best benefit from CIR level rehab at dc to optimize independence with ADLs, mobility. Will follow.     Follow Up Recommendations  CIR;Supervision/Assistance - 24 hour    Equipment Recommendations  Other (comment)(TBD at next venue of care )    Recommendations for Other Services Rehab consult     Precautions / Restrictions Precautions Precautions: Fall Restrictions Weight Bearing Restrictions: No      Mobility Bed Mobility Overal bed mobility: Needs Assistance Bed Mobility: Supine to Sit;Sit to Supine     Supine to sit: Min assist Sit to supine:  Min guard   General bed mobility comments: min guard to elevate trunk to EOB, min guard to return supine; increased time and effort   Transfers Overall transfer level: Needs assistance Equipment used: 1 person hand held assist Transfers: Sit to/from Stand Sit to Stand: Min assist         General transfer comment: min assist to power up and steady into standing (pushing with R UE and HHA to therapist on L UE), limited tolerance and did not achieve full stand due to dizziness    Balance Overall balance assessment: Needs assistance Sitting-balance support: No upper extremity supported;Feet supported Sitting balance-Leahy Scale: Poor Sitting balance - Comments: posterior lean and LOB with attempting to don socks, statically min guard for safety  Postural control: Posterior lean Standing balance support: Single extremity supported;During functional activity Standing balance-Leahy Scale: Poor Standing balance comment: requires UE support                           ADL either performed or assessed with clinical judgement   ADL Overall ADL's : Needs assistance/impaired     Grooming: Set up;Sitting   Upper Body Bathing: Minimal assistance;Sitting   Lower Body Bathing: Maximal assistance;Sit to/from stand   Upper Body Dressing : Moderate assistance;Sitting   Lower Body Dressing: Maximal assistance;Sit to/from stand Lower Body Dressing Details (indicate cue type and reason): unable to don socks today, posterior lean dynamically when attempting; sit to stand min assist    Toilet Transfer Details (indicate cue type and reason): deferred due to dizziness          Functional mobility during ADLs: Minimal  assistance General ADL Comments: pt limited by dizziness, L sided weakness and slow processing      Vision Patient Visual Report: Blurring of vision Vision Assessment?: Yes Eye Alignment: Within Functional Limits Ocular Range of Motion: Within Functional  Limits Alignment/Gaze Preference: Within Defined Limits Tracking/Visual Pursuits: Able to track stimulus in all quads without difficulty Convergence: Within functional limits Visual Fields: (inconsistent with R upper quadrant testing ) Additional Comments: pt reports blurry vision, "L eye heavy", able to scan, read clock but noted difficulty with R upper quadrant number with L eye      Perception Perception Perception Tested?: Yes Perception Deficits: Inattention/neglect Inattention/Neglect: Does not attend to left side of body Comments: requires cueing to engage use of L UE functionally   Praxis      Pertinent Vitals/Pain Pain Assessment: No/denies pain     Hand Dominance Right   Extremity/Trunk Assessment Upper Extremity Assessment Upper Extremity Assessment: LUE deficits/detail LUE Deficits / Details: 3-/5 FF shoulder, 3+/5 elbow and distal, slow but coordinated  LUE Sensation: WNL LUE Coordination: decreased gross motor   Lower Extremity Assessment Lower Extremity Assessment: Defer to PT evaluation       Communication Communication Communication: No difficulties   Cognition Arousal/Alertness: Awake/alert Behavior During Therapy: Flat affect Overall Cognitive Status: Impaired/Different from baseline Area of Impairment: Awareness;Problem solving;Attention                   Current Attention Level: Sustained       Awareness: Emergent Problem Solving: Slow processing;Decreased initiation;Difficulty sequencing;Requires verbal cues;Requires tactile cues General Comments: patient oriented and following commands, slow processing and requires multimodal cueing    General Comments  HR ranged from 123-132 during session, RN aware; initated education on gaze stabilization during mobility, placed several "A's"  around room to assist with gaze stabilization with mobilization (pt denies dizziness supine)     Exercises     Shoulder Instructions      Home Living  Family/patient expects to be discharged to:: Private residence Living Arrangements: Children(19 y/o) Available Help at Discharge: Family;Available 24 hours/day Type of Home: Apartment Home Access: Stairs to enter Entrance Stairs-Number of Steps: 5 Entrance Stairs-Rails: Right;Left;Can reach both Home Layout: One level     Bathroom Shower/Tub: Corporate investment banker: Standard     Home Equipment: None   Additional Comments: daughter nearby and can assist       Prior Functioning/Environment Level of Independence: Independent        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[employer], driving          OT Problem List: Decreased strength;Decreased activity tolerance;Decreased range of motion;Impaired balance (sitting and/or standing);Impaired vision/perception;Decreased coordination;Decreased cognition;Decreased safety awareness;Decreased knowledge of use of DME or AE;Decreased knowledge of precautions;Cardiopulmonary status limiting activity;Obesity;Impaired UE functional use      OT Treatment/Interventions: Self-care/ADL training;DME and/or AE instruction;Neuromuscular education;Therapeutic activities;Cognitive remediation/compensation;Visual/perceptual remediation/compensation;Patient/family education;Balance training    OT Goals(Current goals can be found in the care plan section) Acute Rehab OT Goals Patient Stated Goal: to get better and get back to work OT Goal Formulation: With patient Time For Goal Achievement: 10/05/19 Potential to Achieve Goals: Good  OT Frequency: Min 3X/week   Barriers to D/C:            Co-evaluation              AM-PAC OT "6 Clicks" Daily Activity     Outcome Measure Help from another person eating meals?: A Little Help from  another person taking care of personal grooming?: A Little Help from another person toileting, which includes using toliet, bedpan, or urinal?: Total Help from another person bathing  (including washing, rinsing, drying)?: A Lot Help from another person to put on and taking off regular upper body clothing?: A Lot Help from another person to put on and taking off regular lower body clothing?: A Lot 6 Click Score: 13   End of Session Equipment Utilized During Treatment: Gait belt Nurse Communication: Mobility status;Other (comment)(HR )  Activity Tolerance: Patient tolerated treatment well Patient left: in bed;with call bell/phone within reach;with bed alarm set  OT Visit Diagnosis: Other abnormalities of gait and mobility (R26.89);Dizziness and giddiness (R42);Other symptoms and signs involving the nervous system (R29.898);Muscle weakness (generalized) (M62.81)                Time: 8127-5170 OT Time Calculation (min): 25 min Charges:  OT General Charges $OT Visit: 1 Visit OT Evaluation $OT Eval Moderate Complexity: 1 Mod OT Treatments $Self Care/Home Management : 8-22 mins  Jolaine Artist, OT Acute Rehabilitation Services Pager [phone] Office [phone]   Delight Stare 09/21/2019, 12:43 PM

## 2019-09-21 NOTE — PMR Pre-admission (Signed)
PMR Admission Coordinator Pre-Admission Assessment  Patient: [name] is an 59 y.o., female MRN: [id number] DOB: 1960/09/29 Height: 5\' 7"  (170.2 cm) Weight: 102.6 kg              Insurance Information HMO:     PPO: yes PRIMARY: [place] [employer] of [place]      Policy#: [id number]      Subscriber: pt CM Name: Herschel Senegal      Phone#: [phone]     Fax#: [phone] Pre-Cert#: [id number] approved    Employer: [employer] Auth provided by fax with effective date 09/22/19 and clinical update due 10/05/19. (pt admitted to CIR 5/20).  Benefits:  Phone #: [phone]     Name: 5/17 Eff. Date: 05/07/2019     Deduct: $1250       Out of Pocket Max: $4890 includes deductible        CIR: $300 per admit then 80%      SNF: 80% Outpatient: $26 to $52 co py per visit    Co-Pay:visits per medical neccesity Home Health:80%   Co-Pay visits per medical neccesity DME: 805     Co-Pay: 205 Providers: in network  SECONDARY: Medicaid [place] access      Policy#: [id number] s         The "Data Collection Information Summary" for patients in Inpatient Rehabilitation Facilities with attached "Privacy Act [place] Records" was provided and verbally reviewed with: N/A  Emergency Contact Information Contact Information    Name Relation Home Work Mobile   [place] Daughter [phone]       Current Medical History  Patient Admitting Diagnosis: CVA  History of Present Illness: 59 y.o. right-handed female with history of diabetes mellitus, hypertension, end-stage renal disease with hemodialysis Monday Wednesday Friday.    Presented 09/18/2019 with acute onset of left-sided weakness facial droop and slurred speech.  Cranial CT scan showed no acute findings.  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  CT angiogram head and neck no acute large vessel or medium vessel occlusion.  Patient did not receive TPA.  MRI showed 1 cm acute infarct in the left cerebellum.  Sub centimeter acute infarct  in the deep white matter just posterior to the atrium of the right lateral ventricle.  Admission chemistries with BUN 29, creatinine 7.97, hemoglobin 9.5.  Echocardiogram with ejection fraction of 60% no wall motion abnormalities.  Neurology follow-up currently maintained on aspirin and Plavix for CVA prophylaxis x3 weeks then Plavix alone.  Subcutaneous heparin for DVT prophylaxis.  Plan for 30-day cardiac event monitor as outpatient.  Hemodialysis ongoing as per renal services.  Tolerating a regular diet.    Ongoing nausea and emesis attributed to gastroparesis . Follow with Dr. Hilarie Fredrickson, [facility]. Has prescriptions for Reglan and Zofran.  Pt is to admit to CIR on 09/23/19.   Complete NIHSS TOTAL: 4 Glasgow Coma Scale Score: 15  Past Medical History  Past Medical History:  Diagnosis Date  . Accelerated hypertension   . Aortic atherosclerosis ([place])   . Arthritis of knee    bilateral  . Diabetes mellitus    type 2 - no meds  . Diabetic gastroparesis ([place])   . Diverticulosis   . ESRD (end stage renal disease) ([place])   . Fibroid uterus   . GERD (gastroesophageal reflux disease)   . Hyperlipidemia   . Hypertension   . HYPERTENSION, BENIGN SYSTEMIC 07/03/2006        . Hypertensive emergency 02/05/2019  . Hypokalemia   .  IDA (iron deficiency anemia)   . Nausea   . Renal disorder   . Umbilical hernia   . Wears dentures     Family History  family history includes Cancer in her father; Diabetes in her mother; Hypertension in her mother.  Prior Rehab/Hospitalizations:  Has the patient had prior rehab or hospitalizations prior to admission? Yes  Has the patient had major surgery during 100 days prior to admission? No  Current Medications   Current Facility-Administered Medications:  .  acetaminophen (TYLENOL) tablet 650 mg, 650 mg, Oral, Q6H PRN **OR** acetaminophen (TYLENOL) suppository 650 mg, 650 mg, Rectal, Q6H PRN, Gladys Damme, MD .  aspirin EC tablet 81 mg, 81 mg, Oral,  Daily, Hammons, Kimberly B, RPH, 81 mg at 09/23/19 1030 .  atorvastatin (LIPITOR) tablet 40 mg, 40 mg, Oral, Daily, Enid Derry, [name], DO, 40 mg at 09/23/19 1030 .  Chlorhexidine Gluconate Cloth 2 % PADS 6 each, 6 each, Topical, Daily, Dickie La, MD, 6 each at 09/23/19 1030 .  clopidogrel (PLAVIX) tablet 75 mg, 75 mg, Oral, Daily, Rosalin Hawking, MD, 75 mg at 09/23/19 1030 .  ferric citrate (AURYXIA) tablet 420 mg, 420 mg, Oral, TID WC, Loren Racer, PA-C, 420 mg at 09/23/19 1241 .  heparin injection 5,000 Units, 5,000 Units, Subcutaneous, Q8H, Gladys Damme, MD, 5,000 Units at 09/23/19 0518 .  metoprolol tartrate (LOPRESSOR) tablet 50 mg, 50 mg, Oral, BID, Wilber Oliphant, MD, 50 mg at 09/23/19 1030 .  ondansetron (ZOFRAN-ODT) disintegrating tablet 4 mg, 4 mg, Oral, Q12H, Gladys Damme, MD, 4 mg at 09/23/19 1030 .  polyethylene glycol (MIRALAX / GLYCOLAX) packet 17 g, 17 g, Oral, Daily PRN, Gladys Damme, MD, 17 g at 09/21/19 1631 .  sodium chloride flush (NS) 0.9 % injection 3 mL, 3 mL, Intravenous, Once, Gladys Damme, MD  Patients Current Diet:  Diet Order            Diet - low sodium heart healthy        Diet renal/carb modified with fluid restriction Diet-HS Snack? Nothing; Fluid restriction: 1200 mL Fluid; Room service appropriate? 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  Independent  Indoor  Mobility: Did the patient need assistance with walking from room to room (with or without device)? Independent  Stairs: Did the patient need assistance with internal or external stairs (with or without device)? Independent  Functional Cognition: Did the patient need help planning regular tasks such as shopping or remembering to take medications? Independent  Home Assistive Devices / Equipment Home Equipment: None  Prior Device Use: Indicate devices/aids used by the patient prior to current illness, exacerbation or injury? None of the above  Current Functional Level Cognition  Arousal/Alertness: Awake/alert Overall Cognitive Status: Impaired/Different from baseline Current Attention Level: Sustained Orientation Level: Oriented X4 Following Commands: Follows multi-step commands consistently Safety/Judgement: Decreased awareness of deficits General Comments: requires multimodal cuing for execution of tasks,  performs well if explained thoroughly Attention: Focused, Sustained Focused Attention: Impaired Focused Attention Impairment: Verbal complex(Backward digit span: 1/2) Sustained Attention: Impaired Sustained Attention Impairment: Verbal complex Memory: Impaired Memory Impairment: Storage deficit, Decreased recall of new information, Retrieval deficit(Immediate: 3/5; delayed: 0/5 with cues: 1/5) Awareness: Impaired Awareness Impairment: Emergent impairment Problem Solving: Impaired Problem Solving Impairment: Verbal complex Executive Function: Sequencing Sequencing: Impaired Sequencing Impairment: Verbal complex(Clock drawing: 2/4)    Extremity Assessment (includes Sensation/Coordination)  Upper Extremity Assessment: LUE deficits/detail LUE Deficits / Details: 3-/5 FF shoulder, 3+/5 elbow and distal, slow but coordinated  LUE Sensation: WNL LUE Coordination: decreased gross motor  Lower Extremity Assessment: Defer to PT evaluation RLE Deficits / Details:  AROM/strength/coordination WFL LLE Deficits / Details: AAROM WFL (requires assist due to weakness); hip flexion 3-, knee extension 3-, ankle DF 3- LLE Sensation: WNL LLE Coordination: (unable to accurately assess due to weakness)    ADLs  Overall ADL's : Needs assistance/impaired Grooming: Set up, Sitting Upper Body Bathing: Minimal assistance, Sitting Lower Body Bathing: Maximal assistance, Sit to/from stand Upper Body Dressing : Moderate assistance, Sitting Lower Body Dressing: Maximal assistance, Sit to/from stand Lower Body Dressing Details (indicate cue type and reason): unable to don socks today, posterior lean dynamically when attempting; sit to stand min assist  Toilet Transfer Details (indicate cue type and reason): deferred due to dizziness  Functional mobility during ADLs: Minimal assistance General ADL Comments: pt limited by dizziness, L sided weakness and slow processing     Mobility  Overal bed mobility: Needs Assistance Bed Mobility: Supine to Sit, Sit to Supine Supine to sit: Mod assist, HOB elevated Sit to supine: Mod assist, HOB elevated General bed mobility comments: mod assist for supine<>sit for LLE and truncal management, scooting to and from EOB with cuing for weight shifting L and R to aid in scooting.    Transfers  Overall transfer level: Needs assistance Equipment used: 1 person hand held assist Transfers: Sit to/from Stand Sit to Stand: Min assist General transfer comment: Min assist for power up, steadying upon standing. Pt stood ~10 seconds with HHA, limited by dizziness and hypotension.    Ambulation / Gait / Stairs / Wheelchair Mobility  Ambulation/Gait Ambulation/Gait assistance: Herbalist (Feet): 10 Feet Assistive device: 1 person hand held assist(2nd hand holding onto end of bed when able) Gait Pattern/deviations: Step-to pattern, Decreased step length - left, Decreased stance time - left, Decreased dorsiflexion - left, Decreased  weight shift to left, Steppage General Gait Details: unable this day due to symptomatic hypotension Gait velocity: very slow Gait velocity interpretation: <1.31 ft/sec, indicative of household ambulator    Posture / Balance Dynamic Sitting Balance Sitting balance - Comments: able to sit EOB without PT support Balance Overall balance assessment: Needs assistance Sitting-balance support: Feet supported, No upper extremity supported Sitting balance-Leahy Scale: Fair Sitting balance - Comments: able to sit EOB without PT support Postural control: Posterior lean Standing balance support: Single extremity supported, During functional activity Standing balance-Leahy Scale: Poor Standing balance comment: requires UE support    Special needs/care consideration Designated visitors are [place] and Denyse Amass Diabetic Management Hgb A1c 6.7 Outpatient hemodialysis at [facility] MWF; patient drives self   Previous Home Environment  Living Arrangements: Children(Has 24 year old son, Denyse Amass, at home, Daughter, Robyne Askew is Lawyer)  Lives With: Son Available Help at Discharge: Family, Available 24 hours/day Type of Home: Apartment Home Layout: One level Home Access: Stairs to enter Entrance Stairs-Rails: Right, Left, Can reach both Entrance  Stairs-Number of Steps: 5 Bathroom Shower/Tub: Tub/shower unit, Industrial/product designer: Yes How Accessible: Accessible via walker Home Care Services: No Additional Comments: daughter nearby and can assist   Discharge Living Setting Plans for Discharge Living Setting: Patient's home, Lives with (comment), Apartment(19 year old son, Denyse Amass) Type of Home at Discharge: Apartment Discharge Home Layout: One level Discharge Home Access: Stairs to enter Entrance Stairs-Rails: Right, Left, Can reach both Entrance Stairs-Number of Steps: 5 Discharge Bathroom Shower/Tub: Tub/shower unit Discharge Bathroom Toilet: Standard Discharge Bathroom  Accessibility: Yes How Accessible: Accessible via walker Does the patient have any problems obtaining your medications?: No  Social/Family/Support Systems Patient Roles: Parent(fulltime employee at [employer]) Contact Information: daughter, Education officer, museum Anticipated Caregiver: daughter and son Anticipated Caregiver's Contact Information: see above Ability/Limitations of Caregiver: son and daughter work and school but will arrange schedule Caregiver Availability: 24/7 Discharge Plan Discussed with Primary Caregiver: Yes Is Caregiver In Agreement with Plan?: Yes Does Caregiver/Family have Issues with Lodging/Transportation while Pt is in Rehab?: No  Goals Patient/Family Goal for Rehab: Mod I to supervision with PT, OT, and SLP Expected length of stay: ELOS 9 to 13 days Pt/Family Agrees to Admission and willing to participate: Yes Program Orientation Provided & Reviewed with Pt/Caregiver Including Roles  & Responsibilities: Yes  Decrease burden of Care through IP rehab admission: n/a  Possible need for SNF placement upon discharge:not anticipated  Patient Condition: This patient's medical and functional status has changed since the consult dated: 09/20/19 in which the Rehabilitation Physician determined and documented that the patient's condition is appropriate for intensive rehabilitative care in an inpatient rehabilitation facility. See "History of Present Illness" (above) for medical update. Functional changes are: some increased assist with bed mobility from Min G to mod A; due to symptomatic hypotension, pt unable to progress gait during most recent therapy note. Since consult note, pt has also been evaluated by OT and requires Min/Max for basic ADLs. Patient's medical and functional status update has been discussed with the Rehabilitation physician and patient remains appropriate for inpatient rehabilitation. Will admit to inpatient rehab today.  Preadmission Screen Completed By:  Danne Baxter, RN MSN  Raechel Ache, OT, 09/23/2019 12:48 PM  with brief updates by Raechel Ache, OTR/L 09/23/19 10:55AM ______________________________________________________________________   Discussed status with Dr. Dagoberto Ligas on 09/23/19 at 10:58AM and received approval for admission today.  Admission Coordinator: Danne Baxter, RN MSN;  Raechel Ache OTR/L with day of admit updates completed by Raechel Ache at time 10:58AM Sudie Grumbling 09/23/19

## 2019-09-21 NOTE — H&P (Signed)
Physical Medicine and Rehabilitation Admission H&P    Chief Complaint  Patient presents with  . Code Stroke  : HPI: Carolyn Sealey. Brown is a 59 year old right-handed female history of diet-controlled diabetes mellitus, hypertension and end-stage renal disease on hemodialysis Monday Wednesday Friday.  Per chart review she lives with her 2 year old son independent prior to admission working full-time at [employer] in housekeeping.  1 level home 5 steps to entry.  Multiple family in the area that works.  Presented 09/18/2019 with acute onset of left-sided weakness facial droop and slurred speech.  Cranial CT scan showed no acute findings.  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  CT angiogram of head and neck no large vessel or medium vessel occlusion.  Patient did not receive TPA.  MRI showed a 1 cm acute infarct in the left cerebellum.  Subcentimeter acute infarct in the deep white matter just posterior to the atrium of the right lateral ventricle.  Admission chemistries BUN 29, creatinine 7.97, troponin high-sensitivity 48-63 hemoglobin 9.5.  Echocardiogram with ejection fraction of 60% no wall motion abnormalities.  Neurology follow-up maintained on aspirin and Plavix x3 weeks then Plavix alone.  Subcutaneous heparin for DVT prophylaxis.  Plan for 30-day cardiac event monitor as outpatient.  Hemodialysis ongoing as per renal services.  Permissive hypertension with bouts of tachycardia she was placed on Lopressor 50 mg twice daily 09/22/2019.  Tolerating a regular diet.  Therapy evaluations completed and patient was admitted for a comprehensive rehab program.  Pt reports doing OK- denies pain and SOB; no BM today; had 1 yesterday. Oliguric- voiding well.    Review of Systems  Constitutional: Negative for chills and fever.  HENT: Negative for hearing loss.   Eyes: Negative for blurred vision and double vision.  Respiratory: Negative for cough and shortness of breath.   Cardiovascular: Positive  for leg swelling. Negative for chest pain and palpitations.  Gastrointestinal: Positive for constipation. Negative for heartburn, nausea and vomiting.       GERD  Genitourinary: Negative for dysuria, flank pain and hematuria.  Musculoskeletal: Positive for myalgias.  Skin: Negative for rash.  Neurological: Positive for speech change and weakness.  All other systems reviewed and are negative.  Past Medical History:  Diagnosis Date  . Accelerated hypertension   . Aortic atherosclerosis ([place])   . Arthritis of knee    bilateral  . Diabetes mellitus    type 2 - no meds  . Diabetic gastroparesis ([place])   . Diverticulosis   . ESRD (end stage renal disease) ([place])   . Fibroid uterus   . GERD (gastroesophageal reflux disease)   . Hyperlipidemia   . Hypertension   . HYPERTENSION, BENIGN SYSTEMIC 07/03/2006        . Hypertensive emergency 02/05/2019  . Hypokalemia   . IDA (iron deficiency anemia)   . Nausea   . Renal disorder   . Umbilical hernia   . Wears dentures    Past Surgical History:  Procedure Laterality Date  . AV FISTULA PLACEMENT Left 02/17/2019   Procedure: ARTERIOVENOUS (AV) FISTULA CREATION LEFT UPPER ARM;  Surgeon: Waynetta Sandy, MD;  Location: [place];  Service: Vascular;  Laterality: Left;  . CATARACT EXTRACTION     right eye  . CESAREAN SECTION     x2  . CHOLECYSTECTOMY     laparoscopic  . FISTULA SUPERFICIALIZATION Left 04/06/2019   Procedure: FISTULA SUPERFICIALIZATION LEFT BRACHIOCEPHALIC;  Surgeon: Waynetta Sandy, MD;  Location: [place];  Service: Vascular;  Laterality: Left;  Transposition left arm brachiocephalic fistula.  . IR FLUORO GUIDE CV LINE RIGHT  02/09/2019  . IR FLUORO GUIDE CV LINE RIGHT  03/05/2019  . IR US GUIDE VASC ACCESS RIGHT  02/09/2019  . MULTIPLE TOOTH EXTRACTIONS    . REDUCTION MAMMAPLASTY Bilateral    Family History  Problem Relation Age of Onset  . Hypertension Mother   . Diabetes Mother   . Cancer Father         lung  . Colon cancer Neg Hx   . Stomach cancer Neg Hx   . Esophageal cancer Neg Hx    Social History:  reports that she quit smoking about 7 years ago. Her smoking use included cigarettes. She quit after 10.00 years of use. She has never used smokeless tobacco. She reports that she does not drink alcohol or use drugs. Allergies:  Allergies  Allergen Reactions  . Lisinopril Anaphylaxis and Other (See Comments)    angioedema  . Venofer  [Ferric Oxide]     Other reaction(s): Back Pain  . Camellia Swelling and Other (See Comments)    Angioedema   . Jardiance [Empagliflozin] Swelling and Rash   Medications Prior to Admission  Medication Sig Dispense Refill  . Accu-Chek Softclix Lancets lancets Use as instructed (Patient taking differently: 1 each by Other route See admin instructions. Use as instructed) 100 each 12  . acetaminophen (TYLENOL) 500 MG tablet Take 1,000 mg by mouth every 6 (six) hours as needed for moderate pain or headache.    Marland Kitchen aspirin EC 325 MG tablet Take 325 mg by mouth daily.    . B Complex-C-Folic Acid (DIALYVITE 458) 0.8 MG TABS Take 1 tablet by mouth 3 (three) times a week. M, w, f    . Blood Glucose Monitoring Suppl (ACCU-CHEK AVIVA PLUS) w/Device KIT Use to check sugar three times a day (Patient taking differently: 1 each by Other route in the morning, at noon, and at bedtime. ) 1 kit 0  . glucose blood (ACCU-CHEK AVIVA PLUS) test strip Use as instructed (Patient taking differently: 1 each by Other route See admin instructions. Use as instructed) 100 each 12  . Lancets (ACCU-CHEK SOFT TOUCH) lancets Use to check sugars three times a day (Patient taking differently: 1 each by Other route in the morning, at noon, and at bedtime. ) 100 each 12  . metoCLOPramide (REGLAN) 5 MG tablet Take 1 tablet (5 mg total) by mouth 4 (four) times daily -  before meals and at bedtime. (Patient taking differently: Take 5 mg by mouth 3 (three) times daily before meals. ) 120 tablet 2  .  ondansetron (ZOFRAN ODT) 4 MG disintegrating tablet Take 1-2 tablets (4-8 mg total) by mouth every 8 (eight) hours as needed for nausea or vomiting. 70 tablet 2  . Vitamin D, Ergocalciferol, (DRISDOL) 1.25 MG (50000 UT) CAPS capsule Take 50,000 Units by mouth every Monday.       Drug Regimen Review Drug regimen was reviewed and remains appropriate with no significant issues identified  Home: Home Living Family/patient expects to be discharged to:: Private residence Living Arrangements: Children(Has 58 year old son, Denyse Amass, at home, Daughter, Robyne Askew is loc) Available Help at Discharge: Family, Available 24 hours/day Type of Home: Apartment Home Access: Stairs to enter [employer] of Steps: 5 Entrance Stairs-Rails: Right, Left, Can reach both Home Layout: One level Bathroom Shower/Tub: Tub/shower unit, Architectural technologist: Standard Bathroom Accessibility: Yes Home Equipment: None Additional Comments: daughter nearby and can assist  Lives With: Son   Functional History: Prior Function Level of Independence: Independent 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[employer], driving    Functional Status:  Mobility: Bed Mobility Overal bed mobility: Needs Assistance Bed Mobility: Supine to Sit, Sit to Supine Supine to sit: Min assist Sit to supine: Min guard General bed mobility comments: min guard to elevate trunk to EOB, min guard to return supine; increased time and effort  Transfers Overall transfer level: Needs assistance Equipment used: 1 person hand held assist Transfers: Sit to/from Stand Sit to Stand: Min assist General transfer comment: min assist to power up and steady into standing (pushing with R UE and HHA to therapist on L UE), limited tolerance and did not achieve full stand due to dizziness Ambulation/Gait Ambulation/Gait assistance: Min assist Gait Distance (Feet): 10 Feet Assistive device: 1 person hand held assist(2nd hand holding onto end of  bed when able) Gait Pattern/deviations: Step-to pattern, Decreased step length - left, Decreased stance time - left, Decreased dorsiflexion - left, Decreased weight shift to left, Steppage General Gait Details: difficulty advancing LLE due to foot drop and hip flexor weakness (plus gripper socks); vc for focus on reaching left heel forward to encourage Lt DF, however then needed cues for knee/hip flexion to allow left foot to clear and advance Gait velocity: very slow Gait velocity interpretation: <1.31 ft/sec, indicative of household ambulator    ADL: ADL Overall ADL's : Needs assistance/impaired Grooming: Set up, Sitting Upper Body Bathing: Minimal assistance, Sitting Lower Body Bathing: Maximal assistance, Sit to/from stand Upper Body Dressing : Moderate assistance, Sitting Lower Body Dressing: Maximal assistance, Sit to/from stand Lower Body Dressing Details (indicate cue type and reason): unable to don socks today, posterior lean dynamically when attempting; sit to stand min assist  Toilet Transfer Details (indicate cue type and reason): deferred due to dizziness  Functional mobility during ADLs: Minimal assistance General ADL Comments: pt limited by dizziness, L sided weakness and slow processing   Cognition: Cognition Overall Cognitive Status: Impaired/Different from baseline Arousal/Alertness: Awake/alert Orientation Level: Oriented X4 Attention: Focused, Sustained Focused Attention: Impaired Focused Attention Impairment: Verbal complex(Backward digit span: 1/2) Sustained Attention: Impaired Sustained Attention Impairment: Verbal complex Memory: Impaired Memory Impairment: Storage deficit, Decreased recall of new information, Retrieval deficit(Immediate: 3/5; delayed: 0/5 with cues: 1/5) Awareness: Impaired Awareness Impairment: Emergent impairment Problem Solving: Impaired Problem Solving Impairment: Verbal complex Executive Function: Sequencing Sequencing:  Impaired Sequencing Impairment: Verbal complex(Clock drawing: 2/4) Cognition Arousal/Alertness: Awake/alert Behavior During Therapy: Flat affect Overall Cognitive Status: Impaired/Different from baseline Area of Impairment: Awareness, Problem solving, Attention Current Attention Level: Sustained Awareness: Emergent Problem Solving: Slow processing, Decreased initiation, Difficulty sequencing, Requires verbal cues, Requires tactile cues General Comments: patient oriented and following commands, slow processing and requires multimodal cueing   Physical Exam: Blood pressure (!) 177/79, pulse (!) 124, temperature 98.2 F (36.8 C), temperature source Oral, resp. rate (!) 22, height '5\' 7"'$  (1.702 m), weight 103.5 kg, SpO2 100 %. Physical Exam  Nursing note and vitals reviewed. Constitutional: She appears well-developed and well-nourished.  Awake, alert, word finding issues, laying supine in bed; appropriate, NAD  HENT:  Head: Normocephalic and atraumatic.  Nose: Nose normal.  Mouth/Throat: Oropharynx is clear and moist. No oropharyngeal exudate.  Tongue midline; facial sensation intact; Equal smile   Eyes: Conjunctivae are normal.  EOMI B/L; (+) nystagmus B/L  Neck: No tracheal deviation present.  Cardiovascular:  Tachycardic; regular rhythm; no JVD; (+)Thrill LUE for HD  Respiratory:  CTA B/L- good  air movement B/L  GI:  Soft, NT, ND, (+)BS   Musculoskeletal:     Cervical back: Normal range of motion and neck supple.     Comments: RUE- deltoid, biceps, triceps, WE< grip and finger abd 5/5 LUE- delt 2/5, biceps 3-/5, triceps 2/5, WE 2/5, grip 3/5, finger abd 2/5 RLE- 5-/5 in HF, KE, KF, DF and PF LLE- HF 2/5, KE and PF 3-/5, DF/PF 3-/5   Neurological: She is alert.  Patient is alert in no acute distress.  Follows commands and makes good eye contact with examiner.  Some delay in processing but provides her name age and date of birth.  Slowed processing/delayed speech; cannot do  finger to nose or rapid alternating movements on L side due to weakness sensation intact to light touch x 4 extremities   Skin: Skin is warm and dry.  RUE 2 IVs- no infiltration  Psychiatric:  Vague, appropriate    Results for orders placed or performed during the hospital encounter of 09/18/19 (from the past 48 hour(s))  CBC     Status: Abnormal   Collection Time: 09/20/19  1:24 PM  Result Value Ref Range   WBC 14.9 (H) 4.0 - 10.5 K/uL   RBC 5.29 (H) 3.87 - 5.11 MIL/uL   Hemoglobin 12.2 12.0 - 15.0 g/dL    Comment: REPEATED TO VERIFY DELTA CHECK NOTED RESULTS VERIFIED VIA RECOLLECT    HCT 42.8 36.0 - 46.0 %   MCV 80.9 80.0 - 100.0 fL   MCH 23.1 (L) 26.0 - 34.0 pg   MCHC 28.5 (L) 30.0 - 36.0 g/dL   RDW 18.2 (H) 11.5 - 15.5 %   Platelets 582 (H) 150 - 400 K/uL   nRBC 0.2 0.0 - 0.2 %    Comment: Performed at [facility], [place] [address], [place], [place] [address]  Troponin I (High Sensitivity)     Status: Abnormal   Collection Time: 09/20/19  2:36 PM  Result Value Ref Range   Troponin I (High Sensitivity) 48 (H) <18 ng/L    Comment: (NOTE) Elevated high sensitivity troponin I (hsTnI) values and significant  changes across serial measurements may suggest ACS but many other  chronic and acute conditions are known to elevate hsTnI results.  Refer to the "Links" section for chest pain algorithms and additional  guidance. Performed at [facility], [place] [address], [place], [place] [address]   Troponin I (High Sensitivity)     Status: Abnormal   Collection Time: 09/20/19  6:44 PM  Result Value Ref Range   Troponin I (High Sensitivity) 63 (H) <18 ng/L    Comment: (NOTE) Elevated high sensitivity troponin I (hsTnI) values and significant  changes across serial measurements may suggest ACS but many other  chronic and acute conditions are known to elevate hsTnI results.  Refer to the "Links" section for chest pain algorithms and additional  guidance. Performed at  [facility], [place] [address], [place], [place] [address]   Culture, blood (routine x 2)     Status: None (Preliminary result)   Collection Time: 09/20/19  6:44 PM   Specimen: BLOOD RIGHT ARM  Result Value Ref Range   Specimen Description BLOOD RIGHT ARM    Special Requests      BOTTLES DRAWN AEROBIC ONLY Blood Culture results may not be optimal due to an inadequate volume of blood received in culture bottles   Culture      NO GROWTH < 24 HOURS Performed  at [facility], [place] [address], [place], [place] [address]    Report Status PENDING   Culture, blood (routine x 2)     Status: None (Preliminary result)   Collection Time: 09/20/19  6:56 PM   Specimen: BLOOD RIGHT HAND  Result Value Ref Range   Specimen Description BLOOD RIGHT HAND    Special Requests      BOTTLES DRAWN AEROBIC ONLY Blood Culture adequate volume   Culture      NO GROWTH < 24 HOURS Performed at [facility], [place] [address], [place], [place] [address]    Report Status PENDING   Urinalysis, Routine w reflex microscopic     Status: Abnormal   Collection Time: 09/20/19  7:00 PM  Result Value Ref Range   Color, Urine YELLOW YELLOW   APPearance CLOUDY (A) CLEAR   Specific Gravity, Urine 1.014 1.005 - 1.030   pH 8.0 5.0 - 8.0   Glucose, UA 50 (A) NEGATIVE mg/dL   Hgb urine dipstick SMALL (A) NEGATIVE   Bilirubin Urine NEGATIVE NEGATIVE   Ketones, ur 5 (A) NEGATIVE mg/dL   Protein, ur 100 (A) NEGATIVE mg/dL   Nitrite NEGATIVE NEGATIVE   Leukocytes,Ua LARGE (A) NEGATIVE   RBC / HPF 11-20 0 - 5 RBC/hpf   WBC, UA >50 (H) 0 - 5 WBC/hpf   Bacteria, UA MANY (A) NONE SEEN   Squamous Epithelial / LPF 21-50 0 - 5   Mucus PRESENT     Comment: Performed at [facility], [place] [address] [place], [place] [address]  Renal function panel     Status: Abnormal   Collection Time: 09/21/19  2:40 AM  Result Value Ref Range   Sodium 139 135 - 145 mmol/L   Potassium 4.7 3.5 - 5.1 mmol/L   Chloride 95 (L)  98 - 111 mmol/L   CO2 23 22 - 32 mmol/L   Glucose, Bld 129 (H) 70 - 99 mg/dL    Comment: Glucose reference range applies only to samples taken after fasting for at least 8 hours.   BUN 70 (H) 6 - 20 mg/dL   Creatinine, Ser 14.17 (H) 0.44 - 1.00 mg/dL   Calcium 9.8 8.9 - 10.3 mg/dL   Phosphorus 5.8 (H) 2.5 - 4.6 mg/dL   Albumin 3.7 3.5 - 5.0 g/dL   GFR calc non Af Amer 2 (L) >60 mL/min   GFR calc Af Amer 3 (L) >60 mL/min   Anion gap 21 (H) 5 - 15    Comment: Performed at [facility] [address] [place], [place] [address]  CBC     Status: Abnormal   Collection Time: 09/21/19  2:40 AM  Result Value Ref Range   WBC 16.9 (H) 4.0 - 10.5 K/uL   RBC 4.86 3.87 - 5.11 MIL/uL   Hemoglobin 11.1 (L) 12.0 - 15.0 g/dL   HCT 39.7 36.0 - 46.0 %   MCV 81.7 80.0 - 100.0 fL   MCH 22.8 (L) 26.0 - 34.0 pg   MCHC 28.0 (L) 30.0 - 36.0 g/dL   RDW 18.5 (H) 11.5 - 15.5 %   Platelets 636 (H) 150 - 400 K/uL   nRBC 0.2 0.0 - 0.2 %    Comment: Performed at [facility], [place] [address] [place], [place] [address]  CBC with Differential/Platelet     Status: Abnormal   Collection Time: 09/21/19  2:40 AM  Result Value Ref Range   WBC 17.4 (  H) 4.0 - 10.5 K/uL   RBC 4.90 3.87 - 5.11 MIL/uL   Hemoglobin 11.4 (L) 12.0 - 15.0 g/dL   HCT 40.0 36.0 - 46.0 %   MCV 81.6 80.0 - 100.0 fL   MCH 23.3 (L) 26.0 - 34.0 pg   MCHC 28.5 (L) 30.0 - 36.0 g/dL   RDW 18.6 (H) 11.5 - 15.5 %   Platelets 632 (H) 150 - 400 K/uL   nRBC 0.4 (H) 0.0 - 0.2 %   Neutrophils Relative % 85 %   Neutro Abs 14.9 (H) 1.7 - 7.7 K/uL   Lymphocytes Relative 9 %   Lymphs Abs 1.6 0.7 - 4.0 K/uL   Monocytes Relative 5 %   Monocytes Absolute 0.8 0.1 - 1.0 K/uL   Eosinophils Relative 0 %   Eosinophils Absolute 0.0 0.0 - 0.5 K/uL   Basophils Relative 0 %   Basophils Absolute 0.1 0.0 - 0.1 K/uL   Immature Granulocytes 1 %   Abs Immature Granulocytes 0.11 (H) 0.00 - 0.07 K/uL    Comment: Performed at [facility] [address] [place], [place] [address]   DG Chest 1 View  Result Date: 09/20/2019 CLINICAL DATA:  Acute coronary syndrome. EXAM: CHEST  1 VIEW COMPARISON:  04/20/2019 FINDINGS: Cardiac silhouette is mildly enlarged. No mediastinal or hilar masses. No evidence of adenopathy. Clear lungs.  No pleural effusion or pneumothorax. Skeletal structures are grossly intact. IMPRESSION: 1. No acute cardiopulmonary disease. 2. Mild cardiomegaly. Electronically Signed   By: Lajean Manes M.D.   On: 09/20/2019 15:25   MR BRAIN WO CONTRAST  Result Date: 09/20/2019 CLINICAL DATA:  59 year old female dialysis patient with code stroke presentation on 09/18/2019, unexplained altered mental status. Did not receive IV tPA. Brain MRI yesterday positive for small ski mic foci in the left cerebellum and right periatrial white matter. EXAM: MRI HEAD WITHOUT CONTRAST TECHNIQUE: Multiplanar, multiecho pulse sequences of the brain and surrounding structures were obtained without intravenous contrast. COMPARISON:  Brain MRI 09/19/2019 and earlier. FINDINGS: Brain: Small foci of restricted diffusion in the right periatrial white matter and left cerebellum persist, with the latter slightly more intense on DWI today, and with the small adjacent left cerebellar area of involvement (series 3, image 13). Associated T2 and FLAIR hyperintensity as before with no evidence of acute hemorrhage, no mass effect. No new areas of abnormal diffusion. Widespread bilateral cerebral white matter T2 and FLAIR hyperintensity with a small area of focal cortical encephalomalacia in the right superior frontal gyrus, patchy abnormal signal in the bilateral deep gray nuclei, and in the pons. Occasional chronic micro hemorrhages again noted. No midline shift, mass effect, evidence of mass lesion, ventriculomegaly, extra-axial collection or acute intracranial hemorrhage. Cervicomedullary junction and pituitary are within normal limits. Vascular: Major intracranial  vascular flow voids are stable. Skull and upper cervical spine: Negative visible cervical spine. Bone marrow signal is stable and within normal limits. Sinuses/Orbits: Stable and negative. Other: Visible internal auditory structures appear normal. Mastoids remain clear. Scalp and face soft tissues appear negative. IMPRESSION: 1. Small acute infarcts in the right periatrial white matter and left cerebellum are not significantly changed from yesterday, with no associated hemorrhage or mass effect. 2. No new acute intracranial abnormality identified. Underlying signal changes compatible with moderately advanced chronic ischemia. Electronically Signed   By: Genevie Ann M.D.   On: 09/20/2019 20:50       Medical Problem List and Plan: 1.  Left-sided weakness and dysarthria secondary to  2 small infarcts left cerebellar deep nucleus and posterior to the atrium of the right lateral ventricle most likely separate small vessel disease.  Plan 30-day cardiac event monitor as outpatient  -patient may  shower  -ELOS/Goals: 2-2.5 weeks; goals supervision to mod I 2.  Antithrombotics: -DVT/anticoagulation: Subcutaneous heparin  -antiplatelet therapy: Aspirin and Plavix x3 weeks then Plavix alone 3. Pain Management: Tylenol as needed 4. Mood: Provide emotional support  -antipsychotic agents: N/A 5. Neuropsych: This patient is capable of making decisions on her own behalf. 6. Skin/Wound Care: Routine skin checks 7. Fluids/Electrolytes/Nutrition: Routine in and outs with follow-up chemistries 8.  End-stage renal disease.  Continue hemodialysis as directed 9.  Hyperlipidemia.  Lipitor 10.  Diet controlled diabetes mellitus.  Hemoglobin A1c 6.7.  Blood sugar checks currently discontinued 11.  Permissive hypertension/tachycardia.  Lopressor initiated 50 mg twice daily 09/22/2019.  Monitor with increased mobility and titrate as required.   Lavon Paganini Angiulli, PA-C 09/21/2019    I have personally performed a face to  face diagnostic evaluation of this patient and formulated the key components of the plan.  Additionally, I have personally reviewed laboratory data, imaging studies, as well as relevant notes and concur with the physician assistant's documentation above.   The patient's status has not changed from the original H&P.  Any changes in documentation from the acute care chart have been noted above.

## 2019-09-21 NOTE — Progress Notes (Signed)
OT Cancellation Note  Patient Details Name: [name] MRN: [id number] DOB: [birth year]   Cancelled Treatment:    Reason Eval/Treat Not Completed: Patient at procedure or test/ unavailable- pt off unit at HD.  Will follow and see as able.   Jolaine Artist, OT Acute Rehabilitation Services Pager [phone] Office [phone]   Delight Stare 09/21/2019, 7:49 AM

## 2019-09-21 NOTE — Progress Notes (Addendum)
Inpatient Rehabilitation Admissions Coordinator  I have received insurance approval for CIR admit. Await medical readiness to admit to CIR. Please advise.   Danne Baxter, RN, MSN Rehab Admissions Coordinator [phone] 09/21/2019 8:33 PM

## 2019-09-21 NOTE — Procedures (Signed)
   I was present at this dialysis session, have reviewed the session itself and made  appropriate changes Kelly Splinter MD [place] pager [phone]   09/21/2019, 1:14 PM

## 2019-09-22 DIAGNOSIS — I10 Essential (primary) hypertension: Secondary | ICD-10-CM

## 2019-09-22 LAB — CBC
HCT: 38.1 % (ref 36.0–46.0)
Hemoglobin: 11.1 g/dL — ABNORMAL LOW (ref 12.0–15.0)
MCH: 23.3 pg — ABNORMAL LOW (ref 26.0–34.0)
MCHC: 29.1 g/dL — ABNORMAL LOW (ref 30.0–36.0)
MCV: 79.9 fL — ABNORMAL LOW (ref 80.0–100.0)
Platelets: 581 10*3/uL — ABNORMAL HIGH (ref 150–400)
RBC: 4.77 MIL/uL (ref 3.87–5.11)
RDW: 18.8 % — ABNORMAL HIGH (ref 11.5–15.5)
WBC: 16.4 10*3/uL — ABNORMAL HIGH (ref 4.0–10.5)
nRBC: 0.2 % (ref 0.0–0.2)

## 2019-09-22 LAB — RENAL FUNCTION PANEL
Albumin: 3.7 g/dL (ref 3.5–5.0)
Anion gap: 16 — ABNORMAL HIGH (ref 5–15)
BUN: 43 mg/dL — ABNORMAL HIGH (ref 6–20)
CO2: 27 mmol/L (ref 22–32)
Calcium: 9.7 mg/dL (ref 8.9–10.3)
Chloride: 97 mmol/L — ABNORMAL LOW (ref 98–111)
Creatinine, Ser: 10.24 mg/dL — ABNORMAL HIGH (ref 0.44–1.00)
GFR calc Af Amer: 4 mL/min — ABNORMAL LOW (ref >60)
GFR calc non Af Amer: 4 mL/min — ABNORMAL LOW (ref >60)
Glucose, Bld: 128 mg/dL — ABNORMAL HIGH (ref 70–99)
Phosphorus: 5.7 mg/dL — ABNORMAL HIGH (ref 2.5–4.6)
Potassium: 4.8 mmol/L (ref 3.5–5.1)
Sodium: 140 mmol/L (ref 135–145)

## 2019-09-22 LAB — URINE CULTURE

## 2019-09-22 MED ORDER — HEPARIN SODIUM (PORCINE) 1000 UNIT/ML IJ SOLN
INTRAMUSCULAR | Status: AC
  Administered 2019-09-22: 2000 [IU]
  Filled 2019-09-22: qty 2

## 2019-09-22 MED ORDER — METOPROLOL TARTRATE 25 MG PO TABS
25.0000 mg | ORAL_TABLET | Freq: Two times a day (BID) | ORAL | Status: DC
  Administered 2019-09-22: 25 mg via ORAL
  Filled 2019-09-22 (×2): qty 1

## 2019-09-22 MED ORDER — METOPROLOL TARTRATE 50 MG PO TABS
50.0000 mg | ORAL_TABLET | Freq: Two times a day (BID) | ORAL | Status: DC
  Administered 2019-09-22 – 2019-09-23 (×2): 50 mg via ORAL
  Filled 2019-09-22 (×2): qty 1

## 2019-09-22 NOTE — Progress Notes (Signed)
SLP Cancellation Note  Patient Details Name: [name] MRN: [id number] DOB: 1961-02-16   Cancelled treatment:       Reason Eval/Treat Not Completed: Patient at procedure or test/unavailable(Pt was at HD earlier and is currently working with therapy. SLP will follow up on subsequent date.)  Evetta Renner I. Hardin Negus, [place], [place] Office number [phone] Pager [phone]  Horton Marshall 09/22/2019, 4:17 PM

## 2019-09-22 NOTE — Progress Notes (Signed)
Physical Therapy Treatment Patient Details Name: [name] MRN: [id number] DOB: April 15, 1961 Today's Date: 09/22/2019    History of Present Illness 59 y.o. female presenting with left-sided weakness and facial droop. PMH is significant for HTN, ESRD with HD MWF, DMT2. CT head showed no hemorrhage, CTA showed no LVO or significant stenosis. Patient was offered tPA by neurology, but declined. MRI brain 1 cm acute infarction in the left cerebellum. Subcentimeter acute infarction in the deep white matter just posterior to the atrium of the right lateral ventricle. 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 Pt required moderate assist for bed mobility and transfer to stand today, unable to progress to gait this day secondary to symptomatic hypotension (see below). Pt sat EOB for 10 minutes total for BP regulation, PT encouraging LE and UE AROM to promote increased BP. Pt tolerated LE exercises well to target LLE weakness post-CVA. Pt is determined to return to PLOF and independence, PT continuing to recommend CIR to maximize pt independence.   BP, HR sitting immediately post-stand: 86/57 (67), 115 bpm BP, HR return to supine: 100/65(78), 104 bpm     Follow Up Recommendations  CIR;Supervision/Assistance - 24 hour     Equipment Recommendations  Rolling walker with 5" wheels    Recommendations for Other Services       Precautions / Restrictions Precautions Precautions: Fall Precaution Comments: L hemiparesis Restrictions Weight Bearing Restrictions: No    Mobility  Bed Mobility Overal bed mobility: Needs Assistance Bed Mobility: Supine to Sit;Sit to Supine     Supine to sit: Mod assist;HOB elevated Sit to supine: Mod assist;HOB elevated   General bed mobility comments: mod assist for supine<>sit for LLE and truncal management, scooting to and from EOB with cuing for weight shifting L and R to aid in scooting.  Transfers Overall transfer  level: Needs assistance Equipment used: 1 person hand held assist Transfers: Sit to/from Stand Sit to Stand: Min assist         General transfer comment: Min assist for power up, steadying upon standing. Pt stood ~10 seconds with HHA, limited by dizziness and hypotension.  Ambulation/Gait             General Gait Details: unable this day due to symptomatic hypotension   Stairs             Wheelchair Mobility    Modified Rankin (Stroke Patients Only) Modified Rankin (Stroke Patients Only) Pre-Morbid Rankin Score: No symptoms Modified Rankin: Moderately severe disability     Balance Overall balance assessment: Needs assistance Sitting-balance support: Feet supported;No upper extremity supported Sitting balance-Leahy Scale: Fair Sitting balance - Comments: able to sit EOB without PT support   Standing balance support: Single extremity supported;During functional activity Standing balance-Leahy Scale: Poor Standing balance comment: requires UE support                            Cognition Arousal/Alertness: Awake/alert Behavior During Therapy: Flat affect Overall Cognitive Status: Impaired/Different from baseline Area of Impairment: Awareness;Problem solving;Attention;Following commands;Safety/judgement                   Current Attention Level: Sustained   Following Commands: Follows multi-step commands consistently Safety/Judgement: Decreased awareness of deficits Awareness: Emergent Problem Solving: Slow processing;Decreased initiation;Difficulty sequencing;Requires verbal cues;Requires tactile cues General Comments: requires multimodal cuing for execution of tasks, performs well if explained thoroughly  Exercises General Exercises - Lower Extremity Long Arc Quad: AAROM;Left;5 reps;Seated(slow and controlled) Heel Slides: AAROM;Left;10 reps;Supine Hip ABduction/ADduction: AAROM;Left;10 reps;Supine    General Comments General  comments (skin integrity, edema, etc.): HR 106-124 bpm during mobility      Pertinent Vitals/Pain Pain Assessment: Faces Faces Pain Scale: Hurts a little bit Pain Location: LUE, since CVA Pain Descriptors / Indicators: Sore;Discomfort Pain Intervention(s): Limited activity within patient's tolerance;Monitored during session;Repositioned    Home Living                      Prior Function            PT Goals (current goals can now be found in the care plan section) Acute Rehab PT Goals Patient Stated Goal: to get better and get back to work PT Goal Formulation: With patient Time For Goal Achievement: 10/03/19 Potential to Achieve Goals: Good Progress towards PT goals: Progressing toward goals    Frequency    Min 4X/week      PT Plan      Co-evaluation              AM-PAC PT "6 Clicks" Mobility   Outcome Measure  Help needed turning from your back to your side while in a flat bed without using bedrails?: A Little Help needed moving from lying on your back to sitting on the side of a flat bed without using bedrails?: A Lot Help needed moving to and from a bed to a chair (including a wheelchair)?: A Lot Help needed standing up from a chair using your arms (e.g., wheelchair or bedside chair)?: A Little Help needed to walk in hospital room?: A Lot Help needed climbing 3-5 steps with a railing? : A Lot 6 Click Score: 14    End of Session Equipment Utilized During Treatment: Gait belt Activity Tolerance: Patient tolerated treatment well Patient left: in chair;with call bell/phone within reach;with chair alarm set Nurse Communication: Mobility status;Other (comment)(BP and HR response to activity) PT Visit Diagnosis: Hemiplegia and hemiparesis;Other abnormalities of gait and mobility (R26.89) Hemiplegia - Right/Left: Left Hemiplegia - dominant/non-dominant: Non-dominant Hemiplegia - caused by: Cerebral infarction     Time: 9539-6728 PT Time Calculation  (min) (ACUTE ONLY): 27 min  Charges:  $Therapeutic Exercise: 8-22 mins $Therapeutic Activity: 8-22 mins                     Jeshua Ransford E, PT Acute Rehabilitation Services Pager [phone]  Office [phone]  Rori Goar D Adyson Vanburen 09/22/2019, 5:00 PM

## 2019-09-22 NOTE — Progress Notes (Signed)
Patient off the floor for HD 

## 2019-09-22 NOTE — Progress Notes (Signed)
Family Medicine Teaching Service Daily Progress Note Intern Pager: [phone]  Patient name: [name] Medical record number: [id number] Date of birth: 1961/02/10 Age: 59 y.o. Gender: female  Primary Care Provider: Matilde Haymaker, MD Consultants: Neurology, Nephrology Code Status: Full  Pt Overview and Major Events to Date:  Admitted 5/15  Assessment and Plan: [name] is a 59 y.o. female presenting with left-sided weakness and facial droop. PMH is significant for HTN, ESRD, DMT2.  Tachycardia  CXR, repeat MRI, blood cultures were unremarkable. UA was dirty catch and UCx had to many species to count. Patient denies urinary symptoms.  Has taken Coreg 12.5mg  BID.  - Consider restarting coreg    Left-sided weakness 2/2 CVA vs TIA Denies left/right eye pressure. Continues to have nausea and tachycardia.  Workup thus far has been unremarkable. Cardiology to set up 30 day Holter monitor outpatient as there is concern for AFIB due to having 2 separate strokes. ECHO 55-60%. She is to follow up with [facility] in about 4 weeks.  -Neurology signed off, appreciated recommendations: ASA 81 mg daily and clopidogrel 75 mg daily DAPT for 3 weeks and then plavix alone.  -BP goal: permissive HTN up to 220/110 gradually allow BP to normalize in 5-7d -PT/OT/SLP evals: CIR  -Continuous cardiac monitoring -Follow-up on urine culture, blood culture   HTN  Permissive hypertension per neurology to gradually normalize in the next 5-7 days. Patient is not currently taking any HTN medication, nor coreg. There are some reports of using coreg outpatient.  -Permissive HTN to gradually normalize in next 5-7 days  -If BP >220/110 can consider using IV labetalol  -Consider restarting Coreg 12.5mg  BID  ESRD Secondary hyperparathyroidism  Anemia CKD   Patient seen in HD. Left arm cephalic vein fistula with thrill, started using in January of this year.  - HD per Nephrology, continue MWF if  possible      HLD Lipid panel with LDL 127, HDL 47, tot chol 195. Patient has previously been on statin, last prescribed atorvastatin starting in 2017. LDL goal <70 due to CVA. Patient not on statin at home, possibly due to limited evidence of statin benefit dialysis patients. - Continue Atorvastatin   DMT2 Last Hgb A1c in July 2020 was 5.1%, A1c now at 6.7. Glucose this morning 128.  -monitor on daily BMP/ renal function panel    Gastroparesis Patient has cyclical nausea and vomiting, admitted for this in December 2020. Thought to be related to gastroparesis. Follows with Dr. Hilarie Brown, [facility], last seen in February 2021. Patient had prescriptions for both reglan and zofran.  - Monitor for nausea/vomiting  FEN/GI: Renal/carb modified Prophylaxis: Heparin  Disposition: anticipate admission to CIR   Subjective:  Reports no eye pressure this morning.  Endorses nausea and palpitations. Denies dysuria, chest discomfort or shortness of breath.   Objective: Temp:  [97.7 F (36.5 C)-98.9 F (37.2 C)] 98.3 F (36.8 C) (05/19 0741) Pulse Rate:  [41-132] 129 (05/19 1030) Resp:  [20-24] 24 (05/19 0741) BP: (91-194)/(60-134) 120/79 (05/19 1030) SpO2:  [97 %-100 %] 97 % (05/19 0741) Weight:  [103.4 kg] 103.4 kg (05/19 0741)  Physical Exam: GEN: resting in HD, in no acute distress  CV: tachycardia, regular rhythm  RESP: clear to ascultation bilaterally, no increased work of breathing  ABD: soft, non-tender, non-distended  MSK: no appreciable LE edema, non-tender      Laboratory: Recent Labs  Lab 09/20/19 1324 09/21/19 0240 09/22/19 0443  WBC 14.9* 17.4*  16.9*  16.4*  HGB 12.2 11.4*  11.1* 11.1*  HCT 42.8 40.0  39.7 38.1  PLT 582* 632*  636* 581*   Recent Labs  Lab 09/18/19 1227 09/18/19 1236 09/19/19 0359 09/21/19 0240 09/22/19 0443  NA 135   < > 137 139 140  K 4.2   < > 4.1 4.7 4.8  CL 93*   < > 96* 95* 97*  CO2 24   < > 27 23 27   BUN 29*   < > 38* 70* 43*   CREATININE 7.97*   < > 9.52* 14.17* 10.24*  CALCIUM 9.9   < > 9.9 9.8 9.7  PROT 7.4  --   --   --   --   BILITOT 0.7  --   --   --   --   ALKPHOS 78  --   --   --   --   ALT 9  --   --   --   --   AST 10*  --   --   --   --   GLUCOSE 173*   < > 83 129* 128*   < > = values in this interval not displayed.    Imaging/Diagnostic Tests: CT Code Stroke CTA Head W/WO contrast CT Code Stroke CTA Neck W/WO contrast Result Date: 09/18/2019 IMPRESSION: No acute large or medium vessel occlusion. Aortic Atherosclerosis (ICD10-I70.0). Atherosclerotic change at both carotid bifurcations. No stenosis on the right. 10-20% ICA bulb stenosis on the left. Ordinary atherosclerotic change in the carotid siphon regions but without stenosis greater than 30%. Electronically Signed   By: [name] M.D.   On: 09/18/2019 12:51   CT HEAD CODE STROKE WO CONTRAST Result Date: 09/18/2019 IMPRESSION: 1. No acute finding. Mild chronic small-vessel change of the hemispheric white matter. 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 2. ASPECTS is 10. 3. These results were communicated to Dr. Leonel Brown at 12:38 pmon 5/15/2021by text page via the [facility] messaging system. Electronically Signed   By: [name] M.D.   On: 09/18/2019 12:39    Carolyn Hensen, DO 09/22/2019, 10:42 AM PGY-1, [place] Intern pager: [phone], text pages welcome

## 2019-09-22 NOTE — Progress Notes (Signed)
[facility] Progress Note   Subjective:  Seen on HD - says feeling a little better, less nausea. BP remains high and tachycardic. Has been approved for CIR, per notes.   Objective Vitals:   09/22/19 0358 09/22/19 0741 09/22/19 0753 09/22/19 0800  BP: (!) 165/95 (!) 193/94 (!) 194/134 (!) 181/92  Pulse: (!) 122 (!) 120 (!) 117 (!) 118  Resp: 20 (!) 24    Temp: 98.6 F (37 C) 98.3 F (36.8 C)    TempSrc: Oral Oral    SpO2: 100% 97%    Weight:      Height:       Physical Exam General: Ill appearing woman, NAD Heart: Tachycardic, no murmur Lungs: CTAB Abdomen: soft, non-tender Extremities: no LE edema Dialysis Access: LUE AVF + bruit  Additional Objective Labs: Basic Metabolic Panel: Recent Labs  Lab 09/19/19 0359 09/21/19 0240 09/22/19 0443  NA 137 139 140  K 4.1 4.7 4.8  CL 96* 95* 97*  CO2 27 23 27   GLUCOSE 83 129* 128*  BUN 38* 70* 43*  CREATININE 9.52* 14.17* 10.24*  CALCIUM 9.9 9.8 9.7  PHOS  --  5.8* 5.7*   Liver Function Tests: Recent Labs  Lab 09/18/19 1227 09/21/19 0240 09/22/19 0443  AST 10*  --   --   ALT 9  --   --   ALKPHOS 78  --   --   BILITOT 0.7  --   --   PROT 7.4  --   --   ALBUMIN 3.5 3.7 3.7   CBC: Recent Labs  Lab 09/18/19 1227 09/18/19 1236 09/19/19 0359 09/19/19 0359 09/20/19 1324 09/21/19 0240 09/22/19 0443  WBC 9.6   < > 11.1*   < > 14.9* 17.4*  16.9* 16.4*  NEUTROABS 7.4  --   --   --   --  14.9*  --   HGB 9.5*   < > 8.9*   < > 12.2 11.4*  11.1* 11.1*  HCT 33.1*   < > 31.2*   < > 42.8 40.0  39.7 38.1  MCV 79.6*  --  78.8*  --  80.9 81.6  81.7 79.9*  PLT 468*   < > 503*   < > 582* 632*  636* 581*   < > = values in this interval not displayed.   Blood Culture    Component Value Date/Time   SDES BLOOD RIGHT HAND 09/20/2019 1856   SPECREQUEST  09/20/2019 1856    BOTTLES DRAWN AEROBIC ONLY Blood Culture adequate volume   CULT  09/20/2019 1856    NO GROWTH 2 DAYS Performed at [facility], [place] [address] [place],  [address]    REPTSTATUS PENDING 09/20/2019 1856   Studies/Results: DG Chest 1 View  Result Date: 09/20/2019 CLINICAL DATA:  Acute coronary syndrome. EXAM: CHEST  1 VIEW COMPARISON:  04/20/2019 FINDINGS: Cardiac silhouette is mildly enlarged. No mediastinal or hilar masses. No evidence of adenopathy. Clear lungs.  No pleural effusion or pneumothorax. Skeletal structures are grossly intact. IMPRESSION: 1. No acute cardiopulmonary disease. 2. Mild cardiomegaly. Electronically Signed   By: Lajean Manes M.D.   On: 09/20/2019 15:25   MR BRAIN WO CONTRAST  Result Date: 09/20/2019 CLINICAL DATA:  59 year old female dialysis patient with code stroke presentation on 09/18/2019, unexplained altered mental status. Did not receive IV tPA. Brain MRI yesterday positive for small ski mic foci in the left cerebellum and right periatrial white matter. EXAM: MRI HEAD WITHOUT CONTRAST  TECHNIQUE: Multiplanar, multiecho pulse sequences of the brain and surrounding structures were obtained without intravenous contrast. COMPARISON:  Brain MRI 09/19/2019 and earlier. FINDINGS: Brain: Small foci of restricted diffusion in the right periatrial white matter and left cerebellum persist, with the latter slightly more intense on DWI today, and with the small adjacent left cerebellar area of involvement (series 3, image 13). Associated T2 and FLAIR hyperintensity as before with no evidence of acute hemorrhage, no mass effect. No new areas of abnormal diffusion. Widespread bilateral cerebral white matter T2 and FLAIR hyperintensity with a small area of focal cortical encephalomalacia in the right superior frontal gyrus, patchy abnormal signal in the bilateral deep gray nuclei, and in the pons. Occasional chronic micro hemorrhages again noted. No midline shift, mass effect, evidence of mass lesion, ventriculomegaly, extra-axial collection or acute intracranial hemorrhage. Cervicomedullary  junction and pituitary are within normal limits. Vascular: Major intracranial vascular flow voids are stable. Skull and upper cervical spine: Negative visible cervical spine. Bone marrow signal is stable and within normal limits. Sinuses/Orbits: Stable and negative. Other: Visible internal auditory structures appear normal. Mastoids remain clear. Scalp and face soft tissues appear negative. IMPRESSION: 1. Small acute infarcts in the right periatrial white matter and left cerebellum are not significantly changed from yesterday, with no associated hemorrhage or mass effect. 2. No new acute intracranial abnormality identified. Underlying signal changes compatible with moderately advanced chronic ischemia. Electronically Signed   By: Genevie Ann M.D.   On: 09/20/2019 20:50   Medications:  . aspirin EC  81 mg Oral Daily  . atorvastatin  40 mg Oral Daily  . Chlorhexidine Gluconate Cloth  6 each Topical Daily  . clopidogrel  75 mg Oral Daily  . ferric citrate  420 mg Oral TID WC  . heparin  5,000 Units Subcutaneous Q8H  . heparin sodium (porcine)      . ondansetron  4 mg Oral Q12H  . sodium chloride flush  3 mL Intravenous Once    Dialysis Orders: MWF [facility] 4h 400/800 105.5kg 3K/2.25 bath UFP #3 LUE AVF Hep 2000 - Hectoral 53mcg Iv q HD - Mircera 234mcg IV q 2 weeks (last 5/10) - Venofer 100 x 10 ordered --> for Hgb 8.5, tsat 7% on 5/10.   Assessment/Plan: 1. Acute L cerebellar + subcentimeter deep white matter R TML:[id number] L sided weakness and facial drop, but improving. Echo normal. CTA without carotid or posterior circulation stenoses. Prior CVA on imaging that she was unaware of. Being evaluated for CIR. 2. ESRD:Continue HD per MWF schedule - HD today. 3. Hypertension/volume:Allowing permissive HTN w/ acute CVA. Below EDW - attempt to lower further. 4. Anemia(ESRD + IDA):Mircera 225 just given 5/10. Last tsat low - has tolerated Venofer as OP despite Fe allergy - waiting on  IV iron here. 5. Metabolic bone disease:Ca ok, Phos high. Resume home binder Lorin Picket). 6. T2DM 7. Hyperlipidemia: Prev on statin - off for unknown time, resumed here. 8.  Leukocytosis: Per primary.  Veneta Penton, PA-C 09/22/2019, 8:32 AM  [employer]

## 2019-09-22 NOTE — Progress Notes (Signed)
PT Cancellation Note  Patient Details Name: [name] MRN: [id number] DOB: 07-15-1960   Cancelled Treatment:    Reason Eval/Treat Not Completed: Patient at procedure or test/unavailable - Pt at HD, will check back as schedule allows.  [place] Pager [phone]  Office [phone]    Roxine Caddy D Elonda Husky 09/22/2019, 11:29 AM

## 2019-09-22 NOTE — Progress Notes (Signed)
Inpatient Rehabilitation-Admissions Coordinator   I do have insurance approval for admit to CIR. However, noted sustained tachycardia up to 132 today. Spoke to admitting PM&R MD, Dr. Posey Pronto this morning about this case. We will hold admission today to see if HR can be better controlled.   Will follow up tomorrow for possible admit, pending readiness.   Raechel Ache, OTR/L  Rehab Admissions Coordinator  [phone] 09/22/2019 11:11 AM

## 2019-09-23 ENCOUNTER — Encounter (HOSPITAL_COMMUNITY): Payer: Self-pay | Admitting: Physical Medicine & Rehabilitation

## 2019-09-23 ENCOUNTER — Inpatient Hospital Stay (HOSPITAL_COMMUNITY)
Admission: RE | Admit: 2019-09-23 | Discharge: 2019-10-12 | DRG: 056 | Disposition: A | Discharge status: Home Health | Payer: BC Managed Care – PPO | Source: Intra-hospital | Attending: Physical Medicine & Rehabilitation | Admitting: Physical Medicine & Rehabilitation

## 2019-09-23 ENCOUNTER — Other Ambulatory Visit: Payer: Self-pay

## 2019-09-23 DIAGNOSIS — Z7982 Long term (current) use of aspirin: Secondary | ICD-10-CM

## 2019-09-23 DIAGNOSIS — Z8673 Personal history of transient ischemic attack (TIA), and cerebral infarction without residual deficits: Secondary | ICD-10-CM

## 2019-09-23 DIAGNOSIS — E7800 Pure hypercholesterolemia, unspecified: Secondary | ICD-10-CM | POA: Diagnosis not present

## 2019-09-23 DIAGNOSIS — Z87891 Personal history of nicotine dependence: Secondary | ICD-10-CM | POA: Diagnosis not present

## 2019-09-23 DIAGNOSIS — E1122 Type 2 diabetes mellitus with diabetic chronic kidney disease: Secondary | ICD-10-CM | POA: Diagnosis present

## 2019-09-23 DIAGNOSIS — I63342 Cerebral infarction due to thrombosis of left cerebellar artery: Secondary | ICD-10-CM | POA: Diagnosis not present

## 2019-09-23 DIAGNOSIS — E1143 Type 2 diabetes mellitus with diabetic autonomic (poly)neuropathy: Secondary | ICD-10-CM | POA: Diagnosis present

## 2019-09-23 DIAGNOSIS — D631 Anemia in chronic kidney disease: Secondary | ICD-10-CM | POA: Diagnosis present

## 2019-09-23 DIAGNOSIS — I700 Atherosclerosis of aorta: Secondary | ICD-10-CM | POA: Diagnosis present

## 2019-09-23 DIAGNOSIS — I6340 Cerebral infarction due to embolism of unspecified cerebral artery: Secondary | ICD-10-CM | POA: Diagnosis not present

## 2019-09-23 DIAGNOSIS — E1151 Type 2 diabetes mellitus with diabetic peripheral angiopathy without gangrene: Secondary | ICD-10-CM | POA: Diagnosis present

## 2019-09-23 DIAGNOSIS — I69392 Facial weakness following cerebral infarction: Secondary | ICD-10-CM

## 2019-09-23 DIAGNOSIS — Z8249 Family history of ischemic heart disease and other diseases of the circulatory system: Secondary | ICD-10-CM

## 2019-09-23 DIAGNOSIS — I10 Essential (primary) hypertension: Secondary | ICD-10-CM | POA: Diagnosis not present

## 2019-09-23 DIAGNOSIS — R0989 Other specified symptoms and signs involving the circulatory and respiratory systems: Secondary | ICD-10-CM

## 2019-09-23 DIAGNOSIS — R000 Tachycardia, unspecified: Secondary | ICD-10-CM | POA: Diagnosis not present

## 2019-09-23 DIAGNOSIS — R471 Dysarthria and anarthria: Secondary | ICD-10-CM | POA: Diagnosis present

## 2019-09-23 DIAGNOSIS — Z833 Family history of diabetes mellitus: Secondary | ICD-10-CM | POA: Diagnosis not present

## 2019-09-23 DIAGNOSIS — M170 Bilateral primary osteoarthritis of knee: Secondary | ICD-10-CM | POA: Diagnosis present

## 2019-09-23 DIAGNOSIS — R110 Nausea: Secondary | ICD-10-CM

## 2019-09-23 DIAGNOSIS — I69328 Other speech and language deficits following cerebral infarction: Secondary | ICD-10-CM

## 2019-09-23 DIAGNOSIS — Z801 Family history of malignant neoplasm of trachea, bronchus and lung: Secondary | ICD-10-CM

## 2019-09-23 DIAGNOSIS — Z888 Allergy status to other drugs, medicaments and biological substances status: Secondary | ICD-10-CM | POA: Diagnosis not present

## 2019-09-23 DIAGNOSIS — I959 Hypotension, unspecified: Secondary | ICD-10-CM | POA: Diagnosis not present

## 2019-09-23 DIAGNOSIS — I120 Hypertensive chronic kidney disease with stage 5 chronic kidney disease or end stage renal disease: Secondary | ICD-10-CM | POA: Diagnosis present

## 2019-09-23 DIAGNOSIS — R29810 Facial weakness: Secondary | ICD-10-CM | POA: Diagnosis present

## 2019-09-23 DIAGNOSIS — K219 Gastro-esophageal reflux disease without esophagitis: Secondary | ICD-10-CM | POA: Diagnosis present

## 2019-09-23 DIAGNOSIS — I69354 Hemiplegia and hemiparesis following cerebral infarction affecting left non-dominant side: Principal | ICD-10-CM

## 2019-09-23 DIAGNOSIS — Z992 Dependence on renal dialysis: Secondary | ICD-10-CM

## 2019-09-23 DIAGNOSIS — N390 Urinary tract infection, site not specified: Secondary | ICD-10-CM | POA: Diagnosis present

## 2019-09-23 DIAGNOSIS — E871 Hypo-osmolality and hyponatremia: Secondary | ICD-10-CM | POA: Diagnosis present

## 2019-09-23 DIAGNOSIS — R34 Anuria and oliguria: Secondary | ICD-10-CM | POA: Diagnosis present

## 2019-09-23 DIAGNOSIS — M898X9 Other specified disorders of bone, unspecified site: Secondary | ICD-10-CM | POA: Diagnosis present

## 2019-09-23 DIAGNOSIS — E785 Hyperlipidemia, unspecified: Secondary | ICD-10-CM | POA: Diagnosis present

## 2019-09-23 DIAGNOSIS — K3184 Gastroparesis: Secondary | ICD-10-CM | POA: Diagnosis present

## 2019-09-23 DIAGNOSIS — N186 End stage renal disease: Secondary | ICD-10-CM | POA: Diagnosis present

## 2019-09-23 DIAGNOSIS — B957 Other staphylococcus as the cause of diseases classified elsewhere: Secondary | ICD-10-CM | POA: Diagnosis present

## 2019-09-23 DIAGNOSIS — K5790 Diverticulosis of intestine, part unspecified, without perforation or abscess without bleeding: Secondary | ICD-10-CM | POA: Diagnosis present

## 2019-09-23 HISTORY — DX: Personal history of transient ischemic attack (TIA), and cerebral infarction without residual deficits: Z86.73

## 2019-09-23 LAB — CBC
HCT: 39 % (ref 36.0–46.0)
Hemoglobin: 11 g/dL — ABNORMAL LOW (ref 12.0–15.0)
MCH: 22.8 pg — ABNORMAL LOW (ref 26.0–34.0)
MCHC: 28.2 g/dL — ABNORMAL LOW (ref 30.0–36.0)
MCV: 80.7 fL (ref 80.0–100.0)
Platelets: 551 10*3/uL — ABNORMAL HIGH (ref 150–400)
RBC: 4.83 MIL/uL (ref 3.87–5.11)
RDW: 19.3 % — ABNORMAL HIGH (ref 11.5–15.5)
WBC: 17.2 10*3/uL — ABNORMAL HIGH (ref 4.0–10.5)
nRBC: 0.4 % — ABNORMAL HIGH (ref 0.0–0.2)

## 2019-09-23 LAB — RENAL FUNCTION PANEL
Albumin: 3.8 g/dL (ref 3.5–5.0)
Anion gap: 16 — ABNORMAL HIGH (ref 5–15)
BUN: 33 mg/dL — ABNORMAL HIGH (ref 6–20)
CO2: 26 mmol/L (ref 22–32)
Calcium: 9.8 mg/dL (ref 8.9–10.3)
Chloride: 93 mmol/L — ABNORMAL LOW (ref 98–111)
Creatinine, Ser: 7.33 mg/dL — ABNORMAL HIGH (ref 0.44–1.00)
GFR calc Af Amer: 6 mL/min — ABNORMAL LOW (ref >60)
GFR calc non Af Amer: 6 mL/min — ABNORMAL LOW (ref >60)
Glucose, Bld: 137 mg/dL — ABNORMAL HIGH (ref 70–99)
Phosphorus: 5.7 mg/dL — ABNORMAL HIGH (ref 2.5–4.6)
Potassium: 4.7 mmol/L (ref 3.5–5.1)
Sodium: 135 mmol/L (ref 135–145)

## 2019-09-23 MED ORDER — ASPIRIN EC 81 MG PO TBEC
81.0000 mg | DELAYED_RELEASE_TABLET | Freq: Every day | ORAL | Status: DC
  Administered 2019-09-24 – 2019-10-08 (×15): 81 mg via ORAL
  Filled 2019-09-23 (×15): qty 1

## 2019-09-23 MED ORDER — ASPIRIN 81 MG PO TBEC: 81.0000 mg | DELAYED_RELEASE_TABLET | Freq: Every day | ORAL | Status: DC

## 2019-09-23 MED ORDER — METOPROLOL TARTRATE 50 MG PO TABS
50.0000 mg | ORAL_TABLET | Freq: Two times a day (BID) | ORAL | Status: DC
  Administered 2019-09-23 – 2019-10-01 (×12): 50 mg via ORAL
  Filled 2019-09-23 (×14): qty 1

## 2019-09-23 MED ORDER — POLYETHYLENE GLYCOL 3350 17 G PO PACK
17.0000 g | PACK | Freq: Every day | ORAL | Status: DC | PRN
  Administered 2019-09-30: 17 g via ORAL
  Filled 2019-09-23: qty 1

## 2019-09-23 MED ORDER — HEPARIN SODIUM (PORCINE) 5000 UNIT/ML IJ SOLN
5000.0000 [IU] | Freq: Three times a day (TID) | INTRAMUSCULAR | Status: DC
Start: 2019-09-23 — End: 2019-09-23

## 2019-09-23 MED ORDER — POLYETHYLENE GLYCOL 3350 17 G PO PACK: 17.0000 g | PACK | Freq: Every day | ORAL | 0 refills | Status: DC | PRN

## 2019-09-23 MED ORDER — HEPARIN SODIUM (PORCINE) 5000 UNIT/ML IJ SOLN
5000.0000 [IU] | Freq: Three times a day (TID) | INTRAMUSCULAR | Status: DC
  Administered 2019-09-23 – 2019-10-06 (×34): 5000 [IU] via SUBCUTANEOUS
  Filled 2019-09-23 (×35): qty 1

## 2019-09-23 MED ORDER — ONDANSETRON 4 MG PO TBDP
4.0000 mg | ORAL_TABLET | Freq: Two times a day (BID) | ORAL | Status: DC
  Administered 2019-09-23 – 2019-10-12 (×37): 4 mg via ORAL
  Filled 2019-09-23 (×37): qty 1

## 2019-09-23 MED ORDER — CLOPIDOGREL BISULFATE 75 MG PO TABS
75.0000 mg | ORAL_TABLET | Freq: Every day | ORAL | Status: DC
  Administered 2019-09-24 – 2019-10-12 (×19): 75 mg via ORAL
  Filled 2019-09-23 (×19): qty 1

## 2019-09-23 MED ORDER — ATORVASTATIN CALCIUM 40 MG PO TABS
40.0000 mg | ORAL_TABLET | Freq: Every day | ORAL | Status: DC
  Administered 2019-09-24 – 2019-10-12 (×19): 40 mg via ORAL
  Filled 2019-09-23 (×19): qty 1

## 2019-09-23 MED ORDER — FERRIC CITRATE 1 GM 210 MG(FE) PO TABS
420.0000 mg | ORAL_TABLET | Freq: Three times a day (TID) | ORAL | Status: DC
  Administered 2019-09-23 – 2019-10-12 (×53): 420 mg via ORAL
  Filled 2019-09-23 (×59): qty 2

## 2019-09-23 MED ORDER — SORBITOL 70 % SOLN
30.0000 mL | Freq: Every day | Status: DC | PRN
  Administered 2019-10-07 – 2019-10-08 (×2): 30 mL via ORAL
  Filled 2019-09-23 (×2): qty 30

## 2019-09-23 MED ORDER — CLOPIDOGREL BISULFATE 75 MG PO TABS: 75.0000 mg | ORAL_TABLET | Freq: Every day | ORAL | Status: DC

## 2019-09-23 MED ORDER — ONDANSETRON 4 MG PO TBDP: 4.0000 mg | ORAL_TABLET | Freq: Two times a day (BID) | ORAL | 0 refills | Status: DC

## 2019-09-23 MED ORDER — ACETAMINOPHEN 325 MG PO TABS: 650.0000 mg | ORAL_TABLET | Freq: Four times a day (QID) | ORAL | Status: DC | PRN

## 2019-09-23 MED ORDER — ATORVASTATIN CALCIUM 40 MG PO TABS: 40.0000 mg | ORAL_TABLET | Freq: Every day | ORAL | Status: DC

## 2019-09-23 MED ORDER — ACETAMINOPHEN 650 MG RE SUPP: 650.0000 mg | Freq: Four times a day (QID) | RECTAL | Status: DC | PRN

## 2019-09-23 MED ORDER — METOPROLOL TARTRATE 50 MG PO TABS: 50.0000 mg | ORAL_TABLET | Freq: Two times a day (BID) | ORAL | Status: DC

## 2019-09-23 MED ORDER — FERRIC CITRATE 1 GM 210 MG(FE) PO TABS: 420.0000 mg | ORAL_TABLET | Freq: Three times a day (TID) | ORAL | Status: DC

## 2019-09-23 NOTE — Progress Notes (Signed)
[facility] Progress Note   Subjective:   Seen in room. Feels ok. No CP/dyspnea. Has transitioned to CIR beginning today.  Objective Vitals:   09/23/19 1036 09/23/19 1040 09/23/19 1151 09/23/19 1238  BP: (!) 180/81 (!) 180/81 (!) 182/87 (!) 174/82  Pulse: (!) 107  95 87  Resp: 17 18 20 18   Temp: 98.2 F (36.8 C) 98.2 F (36.8 C) 98 F (36.7 C) 98.1 F (36.7 C)  TempSrc: Oral  Oral Oral  SpO2: 100% 100% 100% 100%  Weight:      Height:       Physical Exam General: Ill appearing woman, NAD Heart: Tachycardic, no murmur Lungs: CTAB Abdomen: soft, non-tender Extremities: no LE edema Dialysis Access: LUE AVF + bruit  Additional Objective Labs: Basic Metabolic Panel: Recent Labs  Lab 09/21/19 0240 09/22/19 0443 09/23/19 0244  NA 139 140 135  K 4.7 4.8 4.7  CL 95* 97* 93*  CO2 23 27 26   GLUCOSE 129* 128* 137*  BUN 70* 43* 33*  CREATININE 14.17* 10.24* 7.33*  CALCIUM 9.8 9.7 9.8  PHOS 5.8* 5.7* 5.7*   Liver Function Tests: Recent Labs  Lab 09/18/19 1227 09/18/19 1227 09/21/19 0240 09/22/19 0443 09/23/19 0244  AST 10*  --   --   --   --   ALT 9  --   --   --   --   ALKPHOS 78  --   --   --   --   BILITOT 0.7  --   --   --   --   PROT 7.4  --   --   --   --   ALBUMIN 3.5   < > 3.7 3.7 3.8   < > = values in this interval not displayed.   CBC: Recent Labs  Lab 09/18/19 1227 09/18/19 1236 09/19/19 0359 09/19/19 0359 09/20/19 1324 09/21/19 0240 09/22/19 0443  WBC 9.6   < > 11.1*   < > 14.9* 17.4*  16.9* 16.4*  NEUTROABS 7.4  --   --   --   --  14.9*  --   HGB 9.5*   < > 8.9*   < > 12.2 11.4*  11.1* 11.1*  HCT 33.1*   < > 31.2*   < > 42.8 40.0  39.7 38.1  MCV 79.6*  --  78.8*  --  80.9 81.6  81.7 79.9*  PLT 468*   < > 503*   < > 582* 632*  636* 581*   < > = values in this interval not displayed.   Medications:  . aspirin EC  81 mg Oral Daily  . atorvastatin  40 mg Oral Daily  . Chlorhexidine Gluconate Cloth  6 each Topical  Daily  . clopidogrel  75 mg Oral Daily  . ferric citrate  420 mg Oral TID WC  . heparin  5,000 Units Subcutaneous Q8H  . metoprolol tartrate  50 mg Oral BID  . ondansetron  4 mg Oral Q12H  . sodium chloride flush  3 mL Intravenous Once    Dialysis Orders: MWF [facility] 4h 400/800 105.5kg 3K/2.25 bath UFP #3 LUE AVF Hep 2000 - Hectoral 49mcg Iv q HD - Mircera 226mcg IV q 2 weeks (last 5/10) - Venofer 100 x 10 ordered --> for Hgb 8.5, tsat 7% on 5/10.   Assessment/Plan: 1. Acute L cerebellar + subcentimeter deep white matter R OIZ:[id number] L sided weakness and facial droop, improving. Echo normal. CTA without carotid or posterior circulation  stenoses. Prior CVA on imaging that she was unaware of. Neuro followed, plan is for 30d event monitor to r/o A-fib. 2. Disability: Now transitioned to CIR - PT/OT. 3. ESRD:Continue HD per MWF schedule - next 5/21. 4. Hypertension/volume:Permissive HTN w/ acute CVA.Below EDW - attempt to lower further. HR improved now that back on metoprolol. 5. Anemia(ESRD + IDA):Mircera 225 just given 5/10. Last tsat low - has tolerated Venofer as OP despite Fe allergy - waiting on IV iron here. 6. Metabolic bone disease:Ca ok, Phos high. Resumed home binder Lorin Picket). 7. T2DM 8. Hyperlipidemia: Prev on statin - off for unknown time, resumed here. 9.  Leukocytosis: Per primary. 10. Gastroparesis: Chronic  issue.  Veneta Penton, PA-C 09/23/2019, 2:44 PM  [facility]

## 2019-09-23 NOTE — Progress Notes (Signed)
Meredith Staggers, MD  Physician  Physical Medicine and Rehabilitation  Consult Note     Signed  Date of Service:  09/20/2019  6:12 AM      Related encounter: ED to Hosp-Admission (Current) from 09/18/2019 in [facility]      Signed      Expand AllCollapse All            Physical Medicine and Rehabilitation Consult Reason for Consult: Left side weakness and facial droop Referring Physician: Family medicine     HPI: REINA WILTON is a 59 y.o. right-handed female with history of diabetes mellitus, hypertension, end-stage renal disease with hemodialysis Monday Wednesday Friday.  Per chart review lives with 49 year old son.  Independent prior to admission working full-time at [employer].  1 level home 5 steps to entry.  Multiple family in the area that work..  Presented 09/18/2019 with acute onset of left-sided weakness facial droop and slurred speech.  Cranial CT scan showed no acute findings.  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  CT angiogram head and neck no acute large vessel or medium vessel occlusion.  Patient did not receive TPA.  MRI showed 1 cm acute infarct in the left cerebellum.  Subcentimeter acute infarct in the deep white matter just posterior to the atrium of the right lateral ventricle.  Admission chemistries with BUN 29, creatinine 7.97, hemoglobin 9.5.  Echocardiogram with ejection fraction of 60% no wall motion abnormalities.  Neurology follow-up currently maintained on aspirin and Plavix for CVA prophylaxis x3 weeks then Plavix alone.  Subcutaneous heparin for DVT prophylaxis.  Plan for 30-day cardiac event monitor as outpatient.  Hemodialysis ongoing as per renal services.  Tolerating a regular diet.  Therapy evaluations completed with recommendations of physical medicine rehab consult.     Review of Systems  Constitutional: Negative for chills and fever.  HENT: Negative for hearing loss.   Eyes: Negative for blurred vision  and double vision.  Respiratory: Negative for cough and shortness of breath.   Cardiovascular: Positive for leg swelling. Negative for chest pain and palpitations.  Gastrointestinal: Positive for constipation. Negative for heartburn and nausea.       GERD  Genitourinary: Negative for dysuria, flank pain and hematuria.  Musculoskeletal: Positive for myalgias.  Skin: Negative for rash.  Neurological: Positive for weakness.  All other systems reviewed and are negative.       Past Medical History:  Diagnosis Date  . Accelerated hypertension    . Aortic atherosclerosis ([place])    . Arthritis of knee      bilateral  . Diabetes mellitus      type 2 - no meds  . Diabetic gastroparesis ([place])    . Diverticulosis    . ESRD (end stage renal disease) ([place])    . Fibroid uterus    . GERD (gastroesophageal reflux disease)    . Hyperlipidemia    . Hypertension    . HYPERTENSION, BENIGN SYSTEMIC 07/03/2006         . Hypertensive emergency 02/05/2019  . Hypokalemia    . IDA (iron deficiency anemia)    . Nausea    . Renal disorder    . Umbilical hernia    . Wears dentures           Past Surgical History:  Procedure Laterality Date  . AV FISTULA PLACEMENT Left 02/17/2019    Procedure: ARTERIOVENOUS (AV) FISTULA CREATION LEFT UPPER ARM;  Surgeon: Waynetta Sandy, MD;  Location: [facility] OR;  Service: Vascular;  Laterality: Left;  . CATARACT EXTRACTION        right eye  . CESAREAN SECTION        x2  . CHOLECYSTECTOMY        laparoscopic  . FISTULA SUPERFICIALIZATION Left 04/06/2019    Procedure: FISTULA SUPERFICIALIZATION LEFT BRACHIOCEPHALIC;  Surgeon: Waynetta Sandy, MD;  Location: [facility];  Service: Vascular;  Laterality: Left;  Transposition left arm brachiocephalic fistula.  . IR FLUORO GUIDE CV LINE RIGHT   02/09/2019  . IR FLUORO GUIDE CV LINE RIGHT   03/05/2019  . IR US GUIDE VASC ACCESS RIGHT   02/09/2019  . MULTIPLE TOOTH EXTRACTIONS      . REDUCTION MAMMAPLASTY  Bilateral           Family History  Problem Relation Age of Onset  . Hypertension Mother    . Diabetes Mother    . Cancer Father          lung  . Colon cancer Neg Hx    . Stomach cancer Neg Hx    . Esophageal cancer Neg Hx      Social History:  reports that she quit smoking about 7 years ago. Her smoking use included cigarettes. She quit after 10.00 years of use. She has never used smokeless tobacco. She reports that she does not drink alcohol or use drugs. Allergies:       Allergies  Allergen Reactions  . Lisinopril Anaphylaxis and Other (See Comments)      angioedema  . Venofer  [Ferric Oxide]        Other reaction(s): Back Pain  . Camellia Swelling and Other (See Comments)      Angioedema   . Jardiance [Empagliflozin] Swelling and Rash          Medications Prior to Admission  Medication Sig Dispense Refill  . Accu-Chek Softclix Lancets lancets Use as instructed (Patient taking differently: 1 each by Other route See admin instructions. Use as instructed) 100 each 12  . acetaminophen (TYLENOL) 500 MG tablet Take 1,000 mg by mouth every 6 (six) hours as needed for moderate pain or headache.      Marland Kitchen aspirin EC 325 MG tablet Take 325 mg by mouth daily.      . B Complex-C-Folic Acid (DIALYVITE 376) 0.8 MG TABS Take 1 tablet by mouth 3 (three) times a week. M, w, f      . Blood Glucose Monitoring Suppl (ACCU-CHEK AVIVA PLUS) w/Device KIT Use to check sugar three times a day (Patient taking differently: 1 each by Other route in the morning, at noon, and at bedtime. ) 1 kit 0  . glucose blood (ACCU-CHEK AVIVA PLUS) test strip Use as instructed (Patient taking differently: 1 each by Other route See admin instructions. Use as instructed) 100 each 12  . Lancets (ACCU-CHEK SOFT TOUCH) lancets Use to check sugars three times a day (Patient taking differently: 1 each by Other route in the morning, at noon, and at bedtime. ) 100 each 12  . metoCLOPramide (REGLAN) 5 MG tablet Take 1 tablet (5 mg  total) by mouth 4 (four) times daily -  before meals and at bedtime. (Patient taking differently: Take 5 mg by mouth 3 (three) times daily before meals. ) 120 tablet 2  . ondansetron (ZOFRAN ODT) 4 MG disintegrating tablet Take 1-2 tablets (4-8 mg total) by mouth every 8 (eight) hours as needed for nausea or vomiting. 70 tablet 2  .  Vitamin D, Ergocalciferol, (DRISDOL) 1.25 MG (50000 UT) CAPS capsule Take 50,000 Units by mouth every Monday.           Home: Home Living Family/patient expects to be discharged to:: Private residence Living Arrangements: Children(19 yo son) Available Help at Discharge: Family, Available PRN/intermittently Type of Home: Apartment Home Access: Stairs to enter Technical brewer of Steps: 5 Entrance Stairs-Rails: Right, Left, Can reach both Home Layout: One level Bathroom Shower/Tub: Tub/shower unit, Architectural technologist: Standard Home Equipment: None  Lives With: Son  Functional History: Prior Function Level of Independence: Independent Comments: denies falls in past 6 months; works 57 hours/week at [employer] Functional Status:  Mobility: Bed Mobility Overal bed mobility: Needs Assistance Bed Mobility: Supine to Sit Supine to sit: Min guard General bed mobility comments: pt with difficulty manipulating linens to get LLE uncovered; slow, effortful to get LLE over EOB and to raise torso to sitting (near need for assist) Transfers Overall transfer level: Needs assistance Equipment used: 1 person hand held assist Transfers: Sit to/from Stand Sit to Stand: Min assist General transfer comment: pt needed bil UE assist to push to stand with pt hesitant due to fear of LLE giving away (it did not) Ambulation/Gait Ambulation/Gait assistance: Min assist Gait Distance (Feet): 10 Feet Assistive device: 1 person hand held assist(2nd hand holding onto end of bed when able) Gait Pattern/deviations: Step-to pattern, Decreased step length - left,  Decreased stance time - left, Decreased dorsiflexion - left, Decreased weight shift to left, Steppage General Gait Details: difficulty advancing LLE due to foot drop and hip flexor weakness (plus gripper socks); vc for focus on reaching left heel forward to encourage Lt DF, however then needed cues for knee/hip flexion to allow left foot to clear and advance Gait velocity: very slow Gait velocity interpretation: <1.31 ft/sec, indicative of household ambulator   ADL:   Cognition: Cognition Overall Cognitive Status: Impaired/Different from baseline Arousal/Alertness: Awake/alert Orientation Level: Oriented X4 Attention: Focused, Sustained Focused Attention: Impaired Focused Attention Impairment: Verbal complex(Backward digit span: 1/2) Sustained Attention: Impaired Sustained Attention Impairment: Verbal complex Memory: Impaired Memory Impairment: Storage deficit, Decreased recall of new information, Retrieval deficit(Immediate: 3/5; delayed: 0/5 with cues: 1/5) Awareness: Impaired Awareness Impairment: Emergent impairment Problem Solving: Impaired Problem Solving Impairment: Verbal complex Executive Function: Sequencing Sequencing: Impaired Sequencing Impairment: Verbal complex(Clock drawing: 2/4) Cognition Arousal/Alertness: Awake/alert Behavior During Therapy: Flat affect Overall Cognitive Status: Impaired/Different from baseline   Blood pressure (!) 188/77, pulse (!) 118, temperature 98.1 F (36.7 C), temperature source Oral, resp. rate 20, height '5\' 7"'$  (1.702 m), weight 106.9 kg, SpO2 100 %. Physical Exam  Eyes: Pupils are equal, round, and reactive to light.  Cardiovascular: Normal rate.  Respiratory: Effort normal.  GI: Soft. She exhibits no distension.  Vomited while I was in room  Musculoskeletal:        General: No edema.     Cervical back: Normal range of motion.  Neurological:  Patient is alert in no acute distress.  Makes eye contact with examiner follows simple  commands.  Speech is a bit delayed but provides name age and date of birth. Moves all 4's but limited in effort d/t severe nausea/vomiting      Lab Results Last 24 Hours  No results found for this or any previous visit (from the past 24 hour(s)).    Imaging Results (Last 48 hours)  CT Code Stroke CTA Head W/WO contrast   Result Date: 09/18/2019 CLINICAL DATA:  Acute presentation with left  sided weakness and left facial droop. Slurred speech. EXAM: CT ANGIOGRAPHY HEAD AND NECK TECHNIQUE: Multidetector CT imaging of the head and neck was performed using the standard protocol during bolus administration of intravenous contrast. Multiplanar CT image reconstructions and MIPs were obtained to evaluate the vascular anatomy. Carotid stenosis measurements (when applicable) are obtained utilizing NASCET criteria, using the distal internal carotid diameter as the denominator. CONTRAST:  Not annotated. COMPARISON:  Head CT same day FINDINGS: CTA NECK FINDINGS Aortic arch: Aortic atherosclerosis. Branching pattern is normal without origin stenosis. Right carotid system: Common carotid artery widely patent to the bifurcation. Minimal atherosclerotic plaque at the carotid bifurcation and ICA bulb but no stenosis. Cervical ICA widely patent. Left carotid system: Common carotid artery widely patent to the bifurcation. Mild atherosclerotic plaque at the ICA bulb. Minimal diameter 3.5 mm. Compared to a more distal cervical ICA diameter of 4 mm, this indicates a 10-20% stenosis. Vertebral arteries: Both vertebral arteries are patent through the cervical region. Right vertebral artery takes an early origin from the proximal subclavian. Skeleton: Ordinary mid cervical spondylosis. Other neck: No mass or lymphadenopathy. Upper chest: Normal Review of the MIP images confirms the above findings CTA HEAD FINDINGS Anterior circulation: Both internal carotid arteries are patent through the skull base and siphon regions. There is mild  atherosclerotic calcification in the carotid siphon regions but no stenosis greater than 30%. The anterior and middle cerebral vessels are patent without proximal stenosis, aneurysm or vascular malformation. No large or medium vessel occlusion. Posterior circulation: Both vertebral arteries widely patent to the basilar. No basilar stenosis. Posterior circulation branch vessels are normal. Patent posterior communicating arteries. Venous sinuses: Normal Anatomic variants: None Review of the MIP images confirms the above findings IMPRESSION: No acute large or medium vessel occlusion. Aortic Atherosclerosis (ICD10-I70.0). Atherosclerotic change at both carotid bifurcations. No stenosis on the right. 10-20% ICA bulb stenosis on the left. Ordinary atherosclerotic change in the carotid siphon regions but without stenosis greater than 30%. Electronically Signed   By: Nelson Chimes M.D.   On: 09/18/2019 12:51    CT Code Stroke CTA Neck W/WO contrast   Result Date: 09/18/2019 CLINICAL DATA:  Acute presentation with left sided weakness and left facial droop. Slurred speech. EXAM: CT ANGIOGRAPHY HEAD AND NECK TECHNIQUE: Multidetector CT imaging of the head and neck was performed using the standard protocol during bolus administration of intravenous contrast. Multiplanar CT image reconstructions and MIPs were obtained to evaluate the vascular anatomy. Carotid stenosis measurements (when applicable) are obtained utilizing NASCET criteria, using the distal internal carotid diameter as the denominator. CONTRAST:  Not annotated. COMPARISON:  Head CT same day FINDINGS: CTA NECK FINDINGS Aortic arch: Aortic atherosclerosis. Branching pattern is normal without origin stenosis. Right carotid system: Common carotid artery widely patent to the bifurcation. Minimal atherosclerotic plaque at the carotid bifurcation and ICA bulb but no stenosis. Cervical ICA widely patent. Left carotid system: Common carotid artery widely patent to the  bifurcation. Mild atherosclerotic plaque at the ICA bulb. Minimal diameter 3.5 mm. Compared to a more distal cervical ICA diameter of 4 mm, this indicates a 10-20% stenosis. Vertebral arteries: Both vertebral arteries are patent through the cervical region. Right vertebral artery takes an early origin from the proximal subclavian. Skeleton: Ordinary mid cervical spondylosis. Other neck: No mass or lymphadenopathy. Upper chest: Normal Review of the MIP images confirms the above findings CTA HEAD FINDINGS Anterior circulation: Both internal carotid arteries are patent through the skull base and siphon regions. There is  mild atherosclerotic calcification in the carotid siphon regions but no stenosis greater than 30%. The anterior and middle cerebral vessels are patent without proximal stenosis, aneurysm or vascular malformation. No large or medium vessel occlusion. Posterior circulation: Both vertebral arteries widely patent to the basilar. No basilar stenosis. Posterior circulation branch vessels are normal. Patent posterior communicating arteries. Venous sinuses: Normal Anatomic variants: None Review of the MIP images confirms the above findings IMPRESSION: No acute large or medium vessel occlusion. Aortic Atherosclerosis (ICD10-I70.0). Atherosclerotic change at both carotid bifurcations. No stenosis on the right. 10-20% ICA bulb stenosis on the left. Ordinary atherosclerotic change in the carotid siphon regions but without stenosis greater than 30%. Electronically Signed   By: Nelson Chimes M.D.   On: 09/18/2019 12:51    MR BRAIN WO CONTRAST   Result Date: 09/19/2019 CLINICAL DATA:  Left arm and leg weakness with facial droop. Acute presentation yesterday. Negative CT evaluation. EXAM: MRI HEAD WITHOUT CONTRAST TECHNIQUE: Multiplanar, multiecho pulse sequences of the brain and surrounding structures were obtained without intravenous contrast. COMPARISON:  CT studies 09/18/2019 FINDINGS: Brain: Diffusion imaging  shows a 1 cm acute to subacute infarction in the left cerebellum. Chronic small-vessel ischemic changes are present throughout the pons. Cerebral hemispheres show a subcentimeter acute infarction in the white matter posterior to the atrium of the right lateral ventricle. Chronic small-vessel ischemic changes are seen extensively throughout the cerebral hemispheric deep and subcortical white matter and basal ganglia. There is an old right frontal cortical and subcortical infarction. No mass lesion, hemorrhage, hydrocephalus or extra-axial collection. One could consider the possibility of coexistence demyelinating disease, but in a patient with diabetes, hypertension and hyperlipidemia, findings are presumed to represent small vessel disease. Vascular: Major vessels at the base of the brain show flow. Skull and upper cervical spine: Negative Sinuses/Orbits: Clear/normal Other: None IMPRESSION: Background pattern of extensive chronic small-vessel ischemic change throughout the brain as outlined above. 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 1 cm acute infarction in the left cerebellum. Subcentimeter acute infarction in the deep white matter just posterior to the atrium of the right lateral ventricle. Neither is associated with hemorrhage or mass effect. Electronically Signed   By: Nelson Chimes M.D.   On: 09/19/2019 07:49    ECHOCARDIOGRAM COMPLETE   Result Date: 09/19/2019    ECHOCARDIOGRAM REPORT   Patient Name:   ENYAH MOMAN Date of Exam: 09/19/2019 Medical Rec #:  [id number]    Height:       67.0 in Accession #:    [id number]   Weight:       235.7 lb Date of Birth:  October 08, 1960    BSA:          2.168 m Patient Age:    24 years     BP:           119/66 mmHg Patient Gender: F            HR:           89 bpm. Exam Location:  Inpatient Procedure: 2D Echo, Cardiac Doppler and Color Doppler Indications:    Stroke 434.91/I163.9  History:        Patient has prior history of Echocardiogram examinations, most                  recent 06/22/2019. Risk Factors:Hypertension, Diabetes, Former                 Smoker, Dyslipidemia and Sleep Apnea. ESRD.  Sonographer:  Clayton Lefort RDCS (AE) Referring Phys: [place]  1. Left ventricular ejection fraction, by estimation, is 55 to 60%. The left ventricle has normal function. The left ventricle has no regional wall motion abnormalities. There is moderate asymmetric left ventricular hypertrophy of the basal and septal segments. Left ventricular diastolic parameters are consistent with Grade I diastolic dysfunction (impaired relaxation).  2. Right ventricular systolic function is normal. The right ventricular size is normal. There is normal pulmonary artery systolic pressure.  3. The mitral valve is normal in structure. No evidence of mitral valve regurgitation. No evidence of mitral stenosis.  4. The aortic valve is tricuspid. Aortic valve regurgitation is not visualized. Mild to moderate aortic valve sclerosis/calcification is present, without any evidence of aortic stenosis.  5. The inferior vena cava is normal in size with greater than 50% respiratory variability, suggesting right atrial pressure of 3 mmHg. FINDINGS  Left Ventricle: Left ventricular ejection fraction, by estimation, is 55 to 60%. The left ventricle has normal function. The left ventricle has no regional wall motion abnormalities. The left ventricular internal cavity size was normal in size. There is  moderate asymmetric left ventricular hypertrophy of the basal and septal segments. Left ventricular diastolic parameters are consistent with Grade I diastolic dysfunction (impaired relaxation). Right Ventricle: The right ventricular size is normal. No increase in right ventricular wall thickness. Right ventricular systolic function is normal. There is normal pulmonary artery systolic pressure. The tricuspid regurgitant velocity is 2.56 m/s, and  with an assumed right atrial pressure of 8 mmHg, the  estimated right ventricular systolic pressure is 37.9 mmHg. Left Atrium: Left atrial size was normal in size. Right Atrium: Right atrial size was normal in size. Pericardium: There is no evidence of pericardial effusion. Mitral Valve: The mitral valve is normal in structure. There is mild thickening of the mitral valve leaflet(s). There is mild calcification of the mitral valve leaflet(s). Normal mobility of the mitral valve leaflets. Moderate mitral annular calcification. No evidence of mitral valve regurgitation. No evidence of mitral valve stenosis. MV peak gradient, 4.8 mmHg. The mean mitral valve gradient is 2.0 mmHg. Tricuspid Valve: The tricuspid valve is normal in structure. Tricuspid valve regurgitation is mild . No evidence of tricuspid stenosis. Aortic Valve: The aortic valve is tricuspid. Aortic valve regurgitation is not visualized. Mild to moderate aortic valve sclerosis/calcification is present, without any evidence of aortic stenosis. Aortic valve mean gradient measures 4.0 mmHg. Aortic valve peak gradient measures 8.0 mmHg. Aortic valve area, by VTI measures 3.44 cm. Pulmonic Valve: The pulmonic valve was normal in structure. Pulmonic valve regurgitation is not visualized. No evidence of pulmonic stenosis. Aorta: The aortic root is normal in size and structure. Venous: The inferior vena cava is normal in size with greater than 50% respiratory variability, suggesting right atrial pressure of 3 mmHg. IAS/Shunts: No atrial level shunt detected by color flow Doppler.  LEFT VENTRICLE PLAX 2D LVIDd:         4.00 cm  Diastology LVIDs:         2.70 cm  LV e' lateral:   5.55 cm/s LV PW:         1.80 cm  LV E/e' lateral: 14.1 LV IVS:        1.80 cm  LV e' medial:    4.79 cm/s LVOT diam:     2.30 cm  LV E/e' medial:  16.4 LV SV:         91 LV SV Index:  42 LVOT Area:     4.15 cm  RIGHT VENTRICLE             IVC RV Basal diam:  3.50 cm     IVC diam: 1.10 cm RV S prime:     11.50 cm/s TAPSE (M-mode): 2.1 cm  LEFT ATRIUM             Index       RIGHT ATRIUM           Index LA diam:        3.20 cm 1.48 cm/m  RA Area:     15.50 cm LA Vol (A2C):   75.0 ml 34.59 ml/m RA Volume:   41.70 ml  19.23 ml/m LA Vol (A4C):   47.9 ml 22.09 ml/m LA Biplane Vol: 63.1 ml 29.10 ml/m  AORTIC VALVE AV Area (Vmax):    3.06 cm AV Area (Vmean):   3.02 cm AV Area (VTI):     3.44 cm AV Vmax:           141.00 cm/s AV Vmean:          94.900 cm/s AV VTI:            0.263 m AV Peak Grad:      8.0 mmHg AV Mean Grad:      4.0 mmHg LVOT Vmax:         104.00 cm/s LVOT Vmean:        69.000 cm/s LVOT VTI:          0.218 m LVOT/AV VTI ratio: 0.83  AORTA Ao Root diam: 3.50 cm Ao Asc diam:  3.10 cm MITRAL VALVE                TRICUSPID VALVE MV Area (PHT): 1.25 cm     TR Peak grad:   26.2 mmHg MV Peak grad:  4.8 mmHg     TR Vmax:        256.00 cm/s MV Mean grad:  2.0 mmHg MV Vmax:       1.09 m/s     SHUNTS MV Vmean:      69.7 cm/s    Systemic VTI:  0.22 m MV Decel Time: 607 msec     Systemic Diam: 2.30 cm MV E velocity: 78.50 cm/s MV A velocity: 108.00 cm/s MV E/A ratio:  0.73 Jenkins Rouge MD Electronically signed by Jenkins Rouge MD Signature Date/Time: 09/19/2019/10:12:49 AM    Final     CT HEAD CODE STROKE WO CONTRAST   Result Date: 09/18/2019 CLINICAL DATA:  Code stroke. Acute stroke with left-sided weakness and left facial droop. Slurred speech. EXAM: CT HEAD WITHOUT CONTRAST TECHNIQUE: Contiguous axial images were obtained from the base of the skull through the vertex without intravenous contrast. COMPARISON:  01/18/2012. FINDINGS: Brain: Chronic small-vessel ischemic changes of the hemispheric white matter. 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 No sign of acute infarction, mass lesion, hemorrhage, hydrocephalus or extra-axial collection. Vascular: There is atherosclerotic calcification of the major vessels at the base of the brain. Skull: Negative Sinuses/Orbits: Clear/normal Other: None ASPECTS ([place] Stroke Program  Early CT Score) - Ganglionic level infarction (caudate, lentiform nuclei, internal capsule, insula, M1-M3 cortex): 7 - Supraganglionic infarction (M4-M6 cortex): 3 Total score (0-10 with 10 being normal): 10 IMPRESSION: 1. No acute finding. Mild chronic small-vessel change of the hemispheric white matter. 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 2. ASPECTS is 10. 3. These results were communicated to Dr. Leonel Ramsay  at 12:38 pmon 5/15/2021by text page via the [facility] messaging system. Electronically Signed   By: Nelson Chimes M.D.   On: 09/18/2019 12:39         Assessment/Plan: Diagnosis: left cerebellar infarct, right DWM infarct behind right lateral ventricle d/t small vessel disease. 1. Does the need for close, 24 hr/day medical supervision in concert with the patient's rehab needs make it unreasonable for this patient to be served in a less intensive setting? Yes 2. Co-Morbidities requiring supervision/potential complications: HTN, DM, ESRD on HD, diabetic gastroparesis 3. Due to bladder management, bowel management, safety, skin/wound care, disease management, medication administration, pain management and patient education, does the patient require 24 hr/day rehab nursing? Yes 4. Does the patient require coordinated care of a physician, rehab nurse, therapy disciplines of PT, OT, SLP to address physical and functional deficits in the context of the above medical diagnosis(es)? Yes Addressing deficits in the following areas: balance, endurance, locomotion, strength, transferring, bowel/bladder control, bathing, dressing, feeding, grooming, toileting, cognition, speech and psychosocial support 5. Can the patient actively participate in an intensive therapy program of at least 3 hrs of therapy per day at least 5 days per week? Yes 6. The potential for patient to make measurable gains while on inpatient rehab is excellent 7. Anticipated functional outcomes upon discharge from inpatient  rehab are modified independent and supervision  with PT, modified independent and supervision with OT, modified independent and supervision with SLP. 8. Estimated rehab length of stay to reach the above functional goals is: 9-13 days 9. Anticipated discharge destination: Home 10. Overall Rehab/Functional Prognosis: excellent   RECOMMENDATIONS: This patient's condition is appropriate for continued rehabilitative care in the following setting: CIR Patient has agreed to participate in recommended program. Yes Note that insurance prior authorization may be required for reimbursement for recommended care.   Comment: Rehab Admissions Coordinator to follow up.   Thanks,   Meredith Staggers, MD, Mellody Drown   I have personally performed a face to face diagnostic evaluation of this patient. Additionally, I have examined pertinent labs and radiographic images. I have reviewed and concur with the physician assistant's documentation above.     Lavon Paganini Angiulli, PA-C 09/20/2019        Revision History                     Routing History

## 2019-09-23 NOTE — Discharge Summary (Signed)
[facility] Discharge Summary  Patient name: [name] Medical record number: [id number] Date of birth: 09/22/1960 Age: 59 y.o. Gender: female Date of Admission: 09/18/2019  Date of Discharge: 09/23/19  Admitting Physician: Gladys Damme, MD  Primary Care Provider: Matilde Haymaker, MD Consultants: Neurology  Indication for Hospitalization:  CVA  Discharge Diagnoses/Problem List:  CVA  HTN ESRD Anemia of chronic disease Secondary hyperparathyroidism Hyperlipidemia Type 2 diabetes Gastroparesis   Disposition: Inpatient rehab (CIR)  Discharge Condition: Stable, improved  Discharge Exam: GEN: well appearing female in no acute distress  CV: regular rate and rhythm, LUE fistula with good thrill  RESP: no increased work of breathing, clear to ascultation bilaterally  ABD: soft, non-tender non-distended  MSK: no edema, non-tender  SKIN: warm, dry, normal skin turgor  NEURO: RUE and RLE strength 5/5, LLE 4+/5, LUE 4/5, normal speech, gross sensation intact, gait deferred, no ataxia or dysmetria appreciated    Brief Hospital Course:  [name] is a 59 y.o. female with PMH significant for HTN, ESRD, DMT2 resenting with left-sided weakness and facial droop.   CVA On the morning of admission ot in her car and by the time she reached her destination, she had left-sided weakness in arm, leg, facial droop, and slurred speech. By the time EMS arrived, slurred speech had resolved, but left-sided facial droop and weakness had not. In the ED, CT head showed no hemorrhage, CTA showed no LVO or significant stenosis. Patient was offered tPA by neurology, but declined. Subsequent MR Brain (the next day) indicated 1 cm acute infarction in the left cerebellum and subcentimeter acute infarction in the deep white matter just posterior to the atrium of the right lateral ventricle.   Neurology followed patient throughout her admission and recommended 30-day Holter  monitor to rule out atrial fibrillation given patient had 2 separate strokes. Patient to continue low-dose aspirin and Plavix daily (DAPT) for 3 weeks and then Plavix alone.  Patient to follow-up with neurology, and [facility], in 4 weeks from admission.   Tachycardia  Patient with intermittent tachycardia throughout the admission. Restarted home beta blocker with change to metoprolol as it may have a better profile for patients on hemodialysis. Tachycardia likely related to secondary to discontinuation of coreg upon admission.   Gastroparesis  Patient with known cyclical nausea and vomiting thought to be related to gastroparesis. Follows with Dr. Hilarie Fredrickson,  GI and prescribed Reglan and Zofran. Reglan was not given during admission.   Home medications were continued for other chronic diseases which remained stable. Patient received dialysis throughout her admission.     Issues for Follow Up:  1. Patient to continue DAPT through June 5th, then plavix alone.  2. She is to follow up with [facility] in 4 weeks 3. Follow up with cardiology regarding 30 day Holter monitor to evaluate for AFIB.  4. Titrate metoprolol as needed.  5. Consider discontinuing long term use of Reglan.   Significant Procedures: None   Significant Labs and Imaging:  Recent Labs  Lab 09/20/19 1324 09/21/19 0240 09/22/19 0443  WBC 14.9* 17.4*  16.9* 16.4*  HGB 12.2 11.4*  11.1* 11.1*  HCT 42.8 40.0  39.7 38.1  PLT 582* 632*  636* 581*   Recent Labs  Lab 09/18/19 1227 09/18/19 1227 09/18/19 1236 09/18/19 1236 09/19/19 0359 09/19/19 0359 09/21/19 0240 09/21/19 0240 09/22/19 0443 09/23/19 0244  NA 135   < > 134*  --  137  --  139  --  140 135  K 4.2   < > 4.3   < > 4.1   < > 4.7   < > 4.8 4.7  CL 93*   < > 98  --  96*  --  95*  --  97* 93*  CO2 24  --   --   --  27  --  23  --  27 26  GLUCOSE 173*   < > 170*  --  83  --  129*  --  128* 137*  BUN 29*   <  > 31*  --  38*  --  70*  --  43* 33*  CREATININE 7.97*   < > 9.20*  --  9.52*  --  14.17*  --  10.24* 7.33*  CALCIUM 9.9  --   --   --  9.9  --  9.8  --  9.7 9.8  PHOS  --   --   --   --   --   --  5.8*  --  5.7* 5.7*  ALKPHOS 78  --   --   --   --   --   --   --   --   --   AST 10*  --   --   --   --   --   --   --   --   --   ALT 9  --   --   --   --   --   --   --   --   --   ALBUMIN 3.5  --   --   --   --   --  3.7  --  3.7 3.8   < > = values in this interval not displayed.    DG Chest 1 View  Result Date: 09/20/2019 CLINICAL DATA:  Acute coronary syndrome. EXAM: CHEST  1 VIEW COMPARISON:  04/20/2019 FINDINGS: Cardiac silhouette is mildly enlarged. No mediastinal or hilar masses. No evidence of adenopathy. Clear lungs.  No pleural effusion or pneumothorax. Skeletal structures are grossly intact. IMPRESSION: 1. No acute cardiopulmonary disease. 2. Mild cardiomegaly. Electronically Signed   By: Lajean Manes M.D.   On: 09/20/2019 15:25   MR BRAIN WO CONTRAST  Result Date: 09/20/2019 CLINICAL DATA:  59 year old female dialysis patient with code stroke presentation on 09/18/2019, unexplained altered mental status. Did not receive IV tPA. Brain MRI yesterday positive for small ski mic foci in the left cerebellum and right periatrial white matter. EXAM: MRI HEAD WITHOUT CONTRAST TECHNIQUE: Multiplanar, multiecho pulse sequences of the brain and surrounding structures were obtained without intravenous contrast. COMPARISON:  Brain MRI 09/19/2019 and earlier. FINDINGS: Brain: Small foci of restricted diffusion in the right periatrial white matter and left cerebellum persist, with the latter slightly more intense on DWI today, and with the small adjacent left cerebellar area of involvement (series 3, image 13). Associated T2 and FLAIR hyperintensity as before with no evidence of acute hemorrhage, no mass effect. No new areas of abnormal diffusion. Widespread bilateral cerebral white matter T2 and FLAIR  hyperintensity with a small area of focal cortical encephalomalacia in the right superior frontal gyrus, patchy abnormal signal in the bilateral deep gray nuclei, and in the pons. Occasional chronic micro hemorrhages again noted. No midline shift, mass effect, evidence of mass lesion, ventriculomegaly, extra-axial collection or acute intracranial hemorrhage. Cervicomedullary junction and pituitary are within normal limits. Vascular: Major intracranial vascular flow voids are stable. Skull and upper  cervical spine: Negative visible cervical spine. Bone marrow signal is stable and within normal limits. Sinuses/Orbits: Stable and negative. Other: Visible internal auditory structures appear normal. Mastoids remain clear. Scalp and face soft tissues appear negative. IMPRESSION: 1. Small acute infarcts in the right periatrial white matter and left cerebellum are not significantly changed from yesterday, with no associated hemorrhage or mass effect. 2. No new acute intracranial abnormality identified. Underlying signal changes compatible with moderately advanced chronic ischemia. Electronically Signed   By: Genevie Ann M.D.   On: 09/20/2019 20:50     Results/Tests Pending at Time of Discharge: None   Discharge Medications:  Allergies as of 09/23/2019      Reactions   Lisinopril Anaphylaxis, Other (See Comments)   angioedema   Venofer  [ferric Oxide]    Other reaction(s): Back Pain   Camellia Swelling, Other (See Comments)   Angioedema    Jardiance [empagliflozin] Swelling, Rash      Medication List    TAKE these medications   Accu-Chek Aviva Plus w/Device Kit Use to check sugar three times a day What changed:   how much to take  how to take this  when to take this  additional instructions   accu-chek soft touch lancets Use to check sugars three times a day What changed:   how much to take  how to take this  when to take this  additional instructions   Accu-Chek Softclix Lancets  lancets Use as instructed What changed:   how much to take  how to take this  when to take this   acetaminophen 500 MG tablet Commonly known as: TYLENOL Take 1,000 mg by mouth every 6 (six) hours as needed for moderate pain or headache.   aspirin 81 MG EC tablet Take 1 tablet (81 mg total) by mouth daily for 15 days. Start taking on: Sep 24, 2019 What changed:   medication strength  how much to take   atorvastatin 40 MG tablet Commonly known as: LIPITOR Take 1 tablet (40 mg total) by mouth daily. Start taking on: Sep 24, 2019   clopidogrel 75 MG tablet Commonly known as: PLAVIX Take 1 tablet (75 mg total) by mouth daily. Start taking on: Sep 24, 2019   Dialyvite 800 0.8 MG Tabs Take 1 tablet by mouth 3 (three) times a week. M, w, f   ferric citrate 1 GM 210 MG(Fe) tablet Commonly known as: AURYXIA Take 2 tablets (420 mg total) by mouth 3 (three) times daily with meals.   glucose blood test strip Commonly known as: Accu-Chek Aviva Plus Use as instructed What changed:   how much to take  how to take this  when to take this   metoCLOPramide 5 MG tablet Commonly known as: Reglan Take 1 tablet (5 mg total) by mouth 4 (four) times daily -  before meals and at bedtime. What changed: when to take this   metoprolol tartrate 50 MG tablet Commonly known as: LOPRESSOR Take 1 tablet (50 mg total) by mouth 2 (two) times daily.   ondansetron 4 MG disintegrating tablet Commonly known as: ZOFRAN-ODT Take 1 tablet (4 mg total) by mouth every 12 (twelve) hours. What changed:   how much to take  when to take this  reasons to take this   polyethylene glycol 17 g packet Commonly known as: MIRALAX / GLYCOLAX Take 17 g by mouth daily as needed for mild constipation.   Vitamin D (Ergocalciferol) 1.25 MG (50000 UNIT) Caps capsule Commonly known as:  DRISDOL Take 50,000 Units by mouth every Monday.       Discharge Instructions: Please refer to Patient  Instructions section of EMR for full details.  Patient was counseled important signs and symptoms that should prompt return to medical care, changes in medications, dietary instructions, activity restrictions, and follow up appointments.   Follow-Up Appointments: Follow-up Information    [facility]. Schedule an appointment as soon as possible for a visit in 4 week(s).   Specialty: Neurology Contact information: [address] Jefferson Hallock [place], [place], DO 09/23/2019, 12:21 PM PGY-1, [place]

## 2019-09-23 NOTE — H&P (Signed)
Physical Medicine and Rehabilitation Admission H&P        Chief Complaint  Patient presents with  . Code Stroke  : HPI: [name] is a 59 year old right-handed female history of diet-controlled diabetes mellitus, hypertension and end-stage renal disease on hemodialysis Monday Wednesday Friday.  Per chart review she lives with her 55 year old son independent prior to admission working full-time at [employer] in housekeeping.  1 level home 5 steps to entry.  Multiple family in the area that works.  Presented 09/18/2019 with acute onset of left-sided weakness facial droop and slurred speech.  Cranial CT scan showed no acute findings.  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  CT angiogram of head and neck no large vessel or medium vessel occlusion.  Patient did not receive TPA.  MRI showed a 1 cm acute infarct in the left cerebellum.  Subcentimeter acute infarct in the deep white matter just posterior to the atrium of the right lateral ventricle.  Admission chemistries BUN 29, creatinine 7.97, troponin high-sensitivity 48-63 hemoglobin 9.5.  Echocardiogram with ejection fraction of 60% no wall motion abnormalities.  Neurology follow-up maintained on aspirin and Plavix x3 weeks then Plavix alone.  Subcutaneous heparin for DVT prophylaxis.  Plan for 30-day cardiac event monitor as outpatient.  Hemodialysis ongoing as per renal services.  Permissive hypertension with bouts of tachycardia she was placed on Lopressor 50 mg twice daily 09/22/2019.  Tolerating a regular diet.  Therapy evaluations completed and patient was admitted for a comprehensive rehab program.   Pt reports doing OK- denies pain and SOB; no BM today; had 1 yesterday. Oliguric- voiding well.      Review of Systems  Constitutional: Negative for chills and fever.  HENT: Negative for hearing loss.   Eyes: Negative for blurred vision and double vision.  Respiratory: Negative for cough and shortness of breath.   Cardiovascular:  Positive for leg swelling. Negative for chest pain and palpitations.  Gastrointestinal: Positive for constipation. Negative for heartburn, nausea and vomiting.       GERD  Genitourinary: Negative for dysuria, flank pain and hematuria.  Musculoskeletal: Positive for myalgias.  Skin: Negative for rash.  Neurological: Positive for speech change and weakness.  All other systems reviewed and are negative.       Past Medical History:  Diagnosis Date  . Accelerated hypertension    . Aortic atherosclerosis ([place])    . Arthritis of knee      bilateral  . Diabetes mellitus      type 2 - no meds  . Diabetic gastroparesis ([place])    . Diverticulosis    . ESRD (end stage renal disease) ([place])    . Fibroid uterus    . GERD (gastroesophageal reflux disease)    . Hyperlipidemia    . Hypertension    . HYPERTENSION, BENIGN SYSTEMIC 07/03/2006         . Hypertensive emergency 02/05/2019  . Hypokalemia    . IDA (iron deficiency anemia)    . Nausea    . Renal disorder    . Umbilical hernia    . Wears dentures           Past Surgical History:  Procedure Laterality Date  . AV FISTULA PLACEMENT Left 02/17/2019    Procedure: ARTERIOVENOUS (AV) FISTULA CREATION LEFT UPPER ARM;  Surgeon: Waynetta Sandy, MD;  Location: [facility];  Service: Vascular;  Laterality: Left;  . CATARACT EXTRACTION        right  eye  . CESAREAN SECTION        x2  . CHOLECYSTECTOMY        laparoscopic  . FISTULA SUPERFICIALIZATION Left 04/06/2019    Procedure: FISTULA SUPERFICIALIZATION LEFT BRACHIOCEPHALIC;  Surgeon: Waynetta Sandy, MD;  Location: [facility];  Service: Vascular;  Laterality: Left;  Transposition left arm brachiocephalic fistula.  . IR FLUORO GUIDE CV LINE RIGHT   02/09/2019  . IR FLUORO GUIDE CV LINE RIGHT   03/05/2019  . IR US GUIDE VASC ACCESS RIGHT   02/09/2019  . MULTIPLE TOOTH EXTRACTIONS      . REDUCTION MAMMAPLASTY Bilateral           Family History  Problem Relation Age of Onset  .  Hypertension Mother    . Diabetes Mother    . Cancer Father          lung  . Colon cancer Neg Hx    . Stomach cancer Neg Hx    . Esophageal cancer Neg Hx      Social History:  reports that she quit smoking about 7 years ago. Her smoking use included cigarettes. She quit after 10.00 years of use. She has never used smokeless tobacco. She reports that she does not drink alcohol or use drugs. Allergies:       Allergies  Allergen Reactions  . Lisinopril Anaphylaxis and Other (See Comments)      angioedema  . Venofer  [Ferric Oxide]        Other reaction(s): Back Pain  . Camellia Swelling and Other (See Comments)      Angioedema   . Jardiance [Empagliflozin] Swelling and Rash          Medications Prior to Admission  Medication Sig Dispense Refill  . Accu-Chek Softclix Lancets lancets Use as instructed (Patient taking differently: 1 each by Other route See admin instructions. Use as instructed) 100 each 12  . acetaminophen (TYLENOL) 500 MG tablet Take 1,000 mg by mouth every 6 (six) hours as needed for moderate pain or headache.      Marland Kitchen aspirin EC 325 MG tablet Take 325 mg by mouth daily.      . B Complex-C-Folic Acid (DIALYVITE 161) 0.8 MG TABS Take 1 tablet by mouth 3 (three) times a week. M, w, f      . Blood Glucose Monitoring Suppl (ACCU-CHEK AVIVA PLUS) w/Device KIT Use to check sugar three times a day (Patient taking differently: 1 each by Other route in the morning, at noon, and at bedtime. ) 1 kit 0  . glucose blood (ACCU-CHEK AVIVA PLUS) test strip Use as instructed (Patient taking differently: 1 each by Other route See admin instructions. Use as instructed) 100 each 12  . Lancets (ACCU-CHEK SOFT TOUCH) lancets Use to check sugars three times a day (Patient taking differently: 1 each by Other route in the morning, at noon, and at bedtime. ) 100 each 12  . metoCLOPramide (REGLAN) 5 MG tablet Take 1 tablet (5 mg total) by mouth 4 (four) times daily -  before meals and at bedtime.  (Patient taking differently: Take 5 mg by mouth 3 (three) times daily before meals. ) 120 tablet 2  . ondansetron (ZOFRAN ODT) 4 MG disintegrating tablet Take 1-2 tablets (4-8 mg total) by mouth every 8 (eight) hours as needed for nausea or vomiting. 70 tablet 2  . Vitamin D, Ergocalciferol, (DRISDOL) 1.25 MG (50000 UT) CAPS capsule Take 50,000 Units by mouth every Monday.  Drug Regimen Review Drug regimen was reviewed and remains appropriate with no significant issues identified   Home: Home Living Family/patient expects to be discharged to:: Private residence Living Arrangements: Children(Has 26 year old son, Denyse Amass, at home, Daughter, Robyne Askew is loc) Available Help at Discharge: Family, Available 24 hours/day Type of Home: Apartment Home Access: Stairs to enter [employer] of Steps: 5 Entrance Stairs-Rails: Right, Left, Can reach both Home Layout: One level Bathroom Shower/Tub: Tub/shower unit, Air cabin crew Accessibility: Yes Home Equipment: None Additional Comments: daughter nearby and can assist   Lives With: Son   Functional History: Prior Function Level of Independence: Independent 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[employer], driving     Functional Status:  Mobility: Bed Mobility Overal bed mobility: Needs Assistance Bed Mobility: Supine to Sit, Sit to Supine Supine to sit: Min assist Sit to supine: Min guard General bed mobility comments: min guard to elevate trunk to EOB, min guard to return supine; increased time and effort  Transfers Overall transfer level: Needs assistance Equipment used: 1 person hand held assist Transfers: Sit to/from Stand Sit to Stand: Min assist General transfer comment: min assist to power up and steady into standing (pushing with R UE and HHA to therapist on L UE), limited tolerance and did not achieve full stand due to dizziness Ambulation/Gait Ambulation/Gait assistance: Min  assist Gait Distance (Feet): 10 Feet Assistive device: 1 person hand held assist(2nd hand holding onto end of bed when able) Gait Pattern/deviations: Step-to pattern, Decreased step length - left, Decreased stance time - left, Decreased dorsiflexion - left, Decreased weight shift to left, Steppage General Gait Details: difficulty advancing LLE due to foot drop and hip flexor weakness (plus gripper socks); vc for focus on reaching left heel forward to encourage Lt DF, however then needed cues for knee/hip flexion to allow left foot to clear and advance Gait velocity: very slow Gait velocity interpretation: <1.31 ft/sec, indicative of household ambulator   ADL: ADL Overall ADL's : Needs assistance/impaired Grooming: Set up, Sitting Upper Body Bathing: Minimal assistance, Sitting Lower Body Bathing: Maximal assistance, Sit to/from stand Upper Body Dressing : Moderate assistance, Sitting Lower Body Dressing: Maximal assistance, Sit to/from stand Lower Body Dressing Details (indicate cue type and reason): unable to don socks today, posterior lean dynamically when attempting; sit to stand min assist  Toilet Transfer Details (indicate cue type and reason): deferred due to dizziness  Functional mobility during ADLs: Minimal assistance General ADL Comments: pt limited by dizziness, L sided weakness and slow processing    Cognition: Cognition Overall Cognitive Status: Impaired/Different from baseline Arousal/Alertness: Awake/alert Orientation Level: Oriented X4 Attention: Focused, Sustained Focused Attention: Impaired Focused Attention Impairment: Verbal complex(Backward digit span: 1/2) Sustained Attention: Impaired Sustained Attention Impairment: Verbal complex Memory: Impaired Memory Impairment: Storage deficit, Decreased recall of new information, Retrieval deficit(Immediate: 3/5; delayed: 0/5 with cues: 1/5) Awareness: Impaired Awareness Impairment: Emergent impairment Problem Solving:  Impaired Problem Solving Impairment: Verbal complex Executive Function: Sequencing Sequencing: Impaired Sequencing Impairment: Verbal complex(Clock drawing: 2/4) Cognition Arousal/Alertness: Awake/alert Behavior During Therapy: Flat affect Overall Cognitive Status: Impaired/Different from baseline Area of Impairment: Awareness, Problem solving, Attention Current Attention Level: Sustained Awareness: Emergent Problem Solving: Slow processing, Decreased initiation, Difficulty sequencing, Requires verbal cues, Requires tactile cues General Comments: patient oriented and following commands, slow processing and requires multimodal cueing    Physical Exam: Blood pressure (!) 177/79, pulse (!) 124, temperature 98.2 F (36.8 C), temperature source Oral, resp. rate (!) 22, height 5'  7" (1.702 m), weight 103.5 kg, SpO2 100 %. Physical Exam  Nursing note and vitals reviewed. Constitutional: She appears well-developed and well-nourished.  Awake, alert, word finding issues, laying supine in bed; appropriate, NAD  HENT:  Head: Normocephalic and atraumatic.  Nose: Nose normal.  Mouth/Throat: Oropharynx is clear and moist. No oropharyngeal exudate.  Tongue midline; facial sensation intact; Equal smile   Eyes: Conjunctivae are normal.  EOMI B/L; (+) nystagmus B/L  Neck: No tracheal deviation present.  Cardiovascular:  Tachycardic; regular rhythm; no JVD; (+)Thrill LUE for HD  Respiratory:  CTA B/L- good air movement B/L  GI:  Soft, NT, ND, (+)BS   Musculoskeletal:     Cervical back: Normal range of motion and neck supple.     Comments: RUE- deltoid, biceps, triceps, WE< grip and finger abd 5/5 LUE- delt 2/5, biceps 3-/5, triceps 2/5, WE 2/5, grip 3/5, finger abd 2/5 RLE- 5-/5 in HF, KE, KF, DF and PF LLE- HF 2/5, KE and PF 3-/5, DF/PF 3-/5   Neurological: She is alert.  Patient is alert in no acute distress.  Follows commands and makes good eye contact with examiner.  Some delay in  processing but provides her name age and date of birth.  Slowed processing/delayed speech; cannot do finger to nose or rapid alternating movements on L side due to weakness sensation intact to light touch x 4 extremities   Skin: Skin is warm and dry.  RUE 2 IVs- no infiltration  Psychiatric:  Vague, appropriate      Lab Results Last 48 Hours  Results for orders placed or performed during the hospital encounter of 09/18/19 (from the past 48 hour(s))  CBC     Status: Abnormal    Collection Time: 09/20/19  1:24 PM  Result Value Ref Range    WBC 14.9 (H) 4.0 - 10.5 K/uL    RBC 5.29 (H) 3.87 - 5.11 MIL/uL    Hemoglobin 12.2 12.0 - 15.0 g/dL      Comment: REPEATED TO VERIFY DELTA CHECK NOTED RESULTS VERIFIED VIA RECOLLECT      HCT 42.8 36.0 - 46.0 %    MCV 80.9 80.0 - 100.0 fL    MCH 23.1 (L) 26.0 - 34.0 pg    MCHC 28.5 (L) 30.0 - 36.0 g/dL    RDW 18.2 (H) 11.5 - 15.5 %    Platelets 582 (H) 150 - 400 K/uL    nRBC 0.2 0.0 - 0.2 %      Comment: Performed at [facility], [place] [address], [place], [place] [address]  Troponin I (High Sensitivity)     Status: Abnormal    Collection Time: 09/20/19  2:36 PM  Result Value Ref Range    Troponin I (High Sensitivity) 48 (H) <18 ng/L      Comment: (NOTE) Elevated high sensitivity troponin I (hsTnI) values and significant  changes across serial measurements may suggest ACS but many other  chronic and acute conditions are known to elevate hsTnI results.  Refer to the "Links" section for chest pain algorithms and additional  guidance. Performed at [facility], [place] [address] [place], [place] [address]    Troponin I (High Sensitivity)     Status: Abnormal    Collection Time: 09/20/19  6:44 PM  Result Value Ref Range    Troponin I (High Sensitivity) 63 (H) <18 ng/L      Comment: (NOTE) Elevated high sensitivity troponin I (hsTnI) values and significant  changes across serial  measurements may suggest ACS but many other  chronic  and acute conditions are known to elevate hsTnI results.  Refer to the "Links" section for chest pain algorithms and additional  guidance. Performed at [facility], [place] [address], [place], [place] [address]    Culture, blood (routine x 2)     Status: None (Preliminary result)    Collection Time: 09/20/19  6:44 PM    Specimen: BLOOD RIGHT ARM  Result Value Ref Range    Specimen Description BLOOD RIGHT ARM      Special Requests          BOTTLES DRAWN AEROBIC ONLY Blood Culture results may not be optimal due to an inadequate volume of blood received in culture bottles    Culture          NO GROWTH < 24 HOURS Performed at [facility] [address] [place], [place] [address]      Report Status PENDING    Culture, blood (routine x 2)     Status: None (Preliminary result)    Collection Time: 09/20/19  6:56 PM    Specimen: BLOOD RIGHT HAND  Result Value Ref Range    Specimen Description BLOOD RIGHT HAND      Special Requests          BOTTLES DRAWN AEROBIC ONLY Blood Culture adequate volume    Culture          NO GROWTH < 24 HOURS Performed at [facility], [place] [address], [place], [place] [address]      Report Status PENDING    Urinalysis, Routine w reflex microscopic     Status: Abnormal    Collection Time: 09/20/19  7:00 PM  Result Value Ref Range    Color, Urine YELLOW YELLOW    APPearance CLOUDY (A) CLEAR    Specific Gravity, Urine 1.014 1.005 - 1.030    pH 8.0 5.0 - 8.0    Glucose, UA 50 (A) NEGATIVE mg/dL    Hgb urine dipstick SMALL (A) NEGATIVE    Bilirubin Urine NEGATIVE NEGATIVE    Ketones, ur 5 (A) NEGATIVE mg/dL    Protein, ur 100 (A) NEGATIVE mg/dL    Nitrite NEGATIVE NEGATIVE    Leukocytes,Ua LARGE (A) NEGATIVE    RBC / HPF 11-20 0 - 5 RBC/hpf    WBC, UA >50 (H) 0 - 5 WBC/hpf    Bacteria, UA MANY (A) NONE SEEN    Squamous Epithelial / LPF 21-50 0 - 5    Mucus PRESENT        Comment: Performed at [facility], [place] [address]  [place], [place] [address]  Renal function panel     Status: Abnormal    Collection Time: 09/21/19  2:40 AM  Result Value Ref Range    Sodium 139 135 - 145 mmol/L    Potassium 4.7 3.5 - 5.1 mmol/L    Chloride 95 (L) 98 - 111 mmol/L    CO2 23 22 - 32 mmol/L    Glucose, Bld 129 (H) 70 - 99 mg/dL      Comment: Glucose reference range applies only to samples taken after fasting for at least 8 hours.    BUN 70 (H) 6 - 20 mg/dL    Creatinine, Ser 14.17 (H) 0.44 - 1.00 mg/dL    Calcium 9.8 8.9 - 10.3 mg/dL    Phosphorus 5.8 (H) 2.5 - 4.6 mg/dL    Albumin 3.7 3.5 -  5.0 g/dL    GFR calc non Af Amer 2 (L) >60 mL/min    GFR calc Af Amer 3 (L) >60 mL/min    Anion gap 21 (H) 5 - 15      Comment: Performed at [facility] [address], [place], [place] [address]  CBC     Status: Abnormal    Collection Time: 09/21/19  2:40 AM  Result Value Ref Range    WBC 16.9 (H) 4.0 - 10.5 K/uL    RBC 4.86 3.87 - 5.11 MIL/uL    Hemoglobin 11.1 (L) 12.0 - 15.0 g/dL    HCT 39.7 36.0 - 46.0 %    MCV 81.7 80.0 - 100.0 fL    MCH 22.8 (L) 26.0 - 34.0 pg    MCHC 28.0 (L) 30.0 - 36.0 g/dL    RDW 18.5 (H) 11.5 - 15.5 %    Platelets 636 (H) 150 - 400 K/uL    nRBC 0.2 0.0 - 0.2 %      Comment: Performed at [facility], [place] [address] [place], [place] [address]  CBC with Differential/Platelet     Status: Abnormal    Collection Time: 09/21/19  2:40 AM  Result Value Ref Range    WBC 17.4 (H) 4.0 - 10.5 K/uL    RBC 4.90 3.87 - 5.11 MIL/uL    Hemoglobin 11.4 (L) 12.0 - 15.0 g/dL    HCT 40.0 36.0 - 46.0 %    MCV 81.6 80.0 - 100.0 fL    MCH 23.3 (L) 26.0 - 34.0 pg    MCHC 28.5 (L) 30.0 - 36.0 g/dL    RDW 18.6 (H) 11.5 - 15.5 %    Platelets 632 (H) 150 - 400 K/uL    nRBC 0.4 (H) 0.0 - 0.2 %    Neutrophils Relative % 85 %    Neutro Abs 14.9 (H) 1.7 - 7.7 K/uL    Lymphocytes Relative 9 %    Lymphs Abs 1.6 0.7 - 4.0 K/uL    Monocytes Relative 5 %    Monocytes Absolute 0.8 0.1 - 1.0 K/uL    Eosinophils  Relative 0 %    Eosinophils Absolute 0.0 0.0 - 0.5 K/uL    Basophils Relative 0 %    Basophils Absolute 0.1 0.0 - 0.1 K/uL    Immature Granulocytes 1 %    Abs Immature Granulocytes 0.11 (H) 0.00 - 0.07 K/uL      Comment: Performed at [facility] [address], [place], [place] [address]       Imaging Results (Last 48 hours)  DG Chest 1 View   Result Date: 09/20/2019 CLINICAL DATA:  Acute coronary syndrome. EXAM: CHEST  1 VIEW COMPARISON:  04/20/2019 FINDINGS: Cardiac silhouette is mildly enlarged. No mediastinal or hilar masses. No evidence of adenopathy. Clear lungs.  No pleural effusion or pneumothorax. Skeletal structures are grossly intact. IMPRESSION: 1. No acute cardiopulmonary disease. 2. Mild cardiomegaly. Electronically Signed   By: Lajean Manes M.D.   On: 09/20/2019 15:25    MR BRAIN WO CONTRAST   Result Date: 09/20/2019 CLINICAL DATA:  59 year old female dialysis patient with code stroke presentation on 09/18/2019, unexplained altered mental status. Did not receive IV tPA. Brain MRI yesterday positive for small ski mic foci in the left cerebellum and right periatrial white matter. EXAM: MRI HEAD WITHOUT CONTRAST TECHNIQUE: Multiplanar, multiecho pulse sequences of the brain and surrounding structures were obtained without intravenous contrast. COMPARISON:  Brain  MRI 09/19/2019 and earlier. FINDINGS: Brain: Small foci of restricted diffusion in the right periatrial white matter and left cerebellum persist, with the latter slightly more intense on DWI today, and with the small adjacent left cerebellar area of involvement (series 3, image 13). Associated T2 and FLAIR hyperintensity as before with no evidence of acute hemorrhage, no mass effect. No new areas of abnormal diffusion. Widespread bilateral cerebral white matter T2 and FLAIR hyperintensity with a small area of focal cortical encephalomalacia in the right superior frontal gyrus, patchy abnormal signal in the bilateral deep  gray nuclei, and in the pons. Occasional chronic micro hemorrhages again noted. No midline shift, mass effect, evidence of mass lesion, ventriculomegaly, extra-axial collection or acute intracranial hemorrhage. Cervicomedullary junction and pituitary are within normal limits. Vascular: Major intracranial vascular flow voids are stable. Skull and upper cervical spine: Negative visible cervical spine. Bone marrow signal is stable and within normal limits. Sinuses/Orbits: Stable and negative. Other: Visible internal auditory structures appear normal. Mastoids remain clear. Scalp and face soft tissues appear negative. IMPRESSION: 1. Small acute infarcts in the right periatrial white matter and left cerebellum are not significantly changed from yesterday, with no associated hemorrhage or mass effect. 2. No new acute intracranial abnormality identified. Underlying signal changes compatible with moderately advanced chronic ischemia. Electronically Signed   By: Genevie Ann M.D.   On: 09/20/2019 20:50             Medical Problem List and Plan: 1.  Left-sided weakness and dysarthria secondary to 2 small infarcts left cerebellar deep nucleus and posterior to the atrium of the right lateral ventricle most likely separate small vessel disease.  Plan 30-day cardiac event monitor as outpatient             -patient may  shower             -ELOS/Goals: 2-2.5 weeks; goals supervision to mod I 2.  Antithrombotics: -DVT/anticoagulation: Subcutaneous heparin             -antiplatelet therapy: Aspirin and Plavix x3 weeks then Plavix alone 3. Pain Management: Tylenol as needed 4. Mood: Provide emotional support             -antipsychotic agents: N/A 5. Neuropsych: This patient is capable of making decisions on her own behalf. 6. Skin/Wound Care: Routine skin checks 7. Fluids/Electrolytes/Nutrition: Routine in and outs with follow-up chemistries 8.  End-stage renal disease.  Continue hemodialysis as directed 9.   Hyperlipidemia.  Lipitor 10.  Diet controlled diabetes mellitus.  Hemoglobin A1c 6.7.  Blood sugar checks currently discontinued 11.  Permissive hypertension/tachycardia.  Lopressor initiated 50 mg twice daily 09/22/2019.  Monitor with increased mobility and titrate as required.    Lavon Paganini Angiulli, PA-C 09/21/2019      I have personally performed a face to face diagnostic evaluation of this patient and formulated the key components of the plan.  Additionally, I have personally reviewed laboratory data, imaging studies, as well as relevant notes and concur with the physician assistant's documentation above.   The patient's status has not changed from the original H&P.  Any changes in documentation from the acute care chart have been noted above.

## 2019-09-23 NOTE — Progress Notes (Signed)
Inpatient Rehabilitation-Admissions Coordinator   Met with pt at the bedside. Notified pt of approval for CIR and bed offer. She has accepted. Reviewed insurance benefits letter and consent form. All questions answered. With her permission, I contacted her daughter as well to inform her of plan for today and information provided to the patient. I received medical clearance from attending service for admit to CIR today. RN and [facility] team aware of plan.   Please call if questions.   Raechel Ache, OTR/L  Rehab Admissions Coordinator  [phone] 09/23/2019 12:51 PM

## 2019-09-23 NOTE — TOC Transition Note (Signed)
Transition of Care [facility]) - CM/SW Discharge Note   Patient Details  Name: [name] MRN: [id number] Date of Birth: 1961/04/09  Transition of Care [facility]) CM/SW Contact:  Pollie Friar, RN Phone Number: 09/23/2019, 2:09 PM   Clinical Narrative:    Pt is discharging to CIR today. CM signing off.    Final next level of care: IP Rehab Facility Barriers to Discharge: No Barriers Identified   Patient Goals and CMS Choice        Discharge Placement                       Discharge Plan and Services                                     Social Determinants of Health (SDOH) Interventions     Readmission Risk Interventions Readmission Risk Prevention Plan 02/18/2019  Transportation Screening Complete  PCP or Specialist Appt within 5-7 Days Complete  Home Care Screening Complete  Medication Review (RN CM) Complete  Some recent data might be hidden

## 2019-09-23 NOTE — Progress Notes (Signed)
Raechel Ache, OT  Rehab Admission Coordinator  Physical Medicine and Rehabilitation  PMR Pre-admission     Signed  Date of Service:  09/21/2019  1:08 PM      Related encounter: ED to Hosp-Admission (Current) from 09/18/2019 in [facility]      Signed        PMR Admission Coordinator Pre-Admission Assessment   Patient: [name] is an 59 y.o., female MRN: [id number] DOB: 1961/03/16 Height: 5\' 7"  (170.2 cm) Weight: 102.6 kg                                                                                                                                                  Insurance Information HMO:     PPO: yes PRIMARY: State BCBS of [place]      Policy#: [id number]      Subscriber: pt CM Name: Herschel Senegal      Phone#: [phone]     Fax#: [phone] Pre-Cert#: [id number] approved    Employer: [employer] Auth provided by fax with effective date 09/22/19 and clinical update due 10/05/19. (pt admitted to CIR 5/20).  Benefits:  Phone #: [phone]     Name: 5/17 Eff. Date: 05/07/2019     Deduct: $1250       Out of Pocket Max: $4890 includes deductible        CIR: $300 per admit then 80%      SNF: 80% Outpatient: $26 to $52 co py per visit    Co-Pay:visits per medical neccesity Home Health:80%   Co-Pay visits per medical neccesity DME: 805     Co-Pay: 205 Providers: in network  SECONDARY: Medicaid [place] access      Policy#: [id number] s           The "Data Collection Information Summary" for patients in Inpatient Rehabilitation Facilities with attached "Privacy Act [place] Records" was provided and verbally reviewed with: N/A   Emergency Contact Information         Contact Information     Name Relation Home Work Mobile    [place] Daughter [phone]           Current Medical History  Patient Admitting Diagnosis: CVA   History of Present Illness: 59 y.o. right-handed female with history of diabetes mellitus, hypertension, end-stage  renal disease with hemodialysis Monday Wednesday Friday.    Presented 09/18/2019 with acute onset of left-sided weakness facial droop and slurred speech.  Cranial CT scan showed no acute findings.  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  CT angiogram head and neck no acute large vessel or medium vessel occlusion.  Patient did not receive TPA.  MRI showed 1 cm acute infarct in the left cerebellum.  Sub centimeter acute infarct in the deep white matter just posterior to the atrium of the right lateral ventricle.  Admission chemistries with BUN 29, creatinine 7.97, hemoglobin 9.5.  Echocardiogram with ejection fraction of 60% no wall motion abnormalities.  Neurology follow-up currently maintained on aspirin and Plavix for CVA prophylaxis x3 weeks then Plavix alone.  Subcutaneous heparin for DVT prophylaxis.  Plan for 30-day cardiac event monitor as outpatient.  Hemodialysis ongoing as per renal services.  Tolerating a regular diet.     Ongoing nausea and emesis attributed to gastroparesis . Follow with Dr. Hilarie Fredrickson, [facility]. Has prescriptions for Reglan and Zofran.   Pt is to admit to CIR on 09/23/19.     Complete NIHSS TOTAL: 4 Glasgow Coma Scale Score: 15   Past Medical History      Past Medical History:  Diagnosis Date  . Accelerated hypertension    . Aortic atherosclerosis ([place])    . Arthritis of knee      bilateral  . Diabetes mellitus      type 2 - no meds  . Diabetic gastroparesis ([place])    . Diverticulosis    . ESRD (end stage renal disease) ([place])    . Fibroid uterus    . GERD (gastroesophageal reflux disease)    . Hyperlipidemia    . Hypertension    . HYPERTENSION, BENIGN SYSTEMIC 07/03/2006         . Hypertensive emergency 02/05/2019  . Hypokalemia    . IDA (iron deficiency anemia)    . Nausea    . Renal disorder    . Umbilical hernia    . Wears dentures        Family History  family history includes Cancer in her father; Diabetes in her mother; Hypertension  in her mother.   Prior Rehab/Hospitalizations:  Has the patient had prior rehab or hospitalizations prior to admission? Yes   Has the patient had major surgery during 100 days prior to admission? No   Current Medications    Current Facility-Administered Medications:  .  acetaminophen (TYLENOL) tablet 650 mg, 650 mg, Oral, Q6H PRN **OR** acetaminophen (TYLENOL) suppository 650 mg, 650 mg, Rectal, Q6H PRN, Gladys Damme, MD .  aspirin EC tablet 81 mg, 81 mg, Oral, Daily, Hammons, Kimberly B, RPH, 81 mg at 09/23/19 1030 .  atorvastatin (LIPITOR) tablet 40 mg, 40 mg, Oral, Daily, Enid Derry, [name], DO, 40 mg at 09/23/19 1030 .  Chlorhexidine Gluconate Cloth 2 % PADS 6 each, 6 each, Topical, Daily, Dickie La, MD, 6 each at 09/23/19 1030 .  clopidogrel (PLAVIX) tablet 75 mg, 75 mg, Oral, Daily, Rosalin Hawking, MD, 75 mg at 09/23/19 1030 .  ferric citrate (AURYXIA) tablet 420 mg, 420 mg, Oral, TID WC, Loren Racer, PA-C, 420 mg at 09/23/19 1241 .  heparin injection 5,000 Units, 5,000 Units, Subcutaneous, Q8H, Gladys Damme, MD, 5,000 Units at 09/23/19 0518 .  metoprolol tartrate (LOPRESSOR) tablet 50 mg, 50 mg, Oral, BID, Wilber Oliphant, MD, 50 mg at 09/23/19 1030 .  ondansetron (ZOFRAN-ODT) disintegrating tablet 4 mg, 4 mg, Oral, Q12H, Gladys Damme, MD, 4 mg at 09/23/19 1030 .  polyethylene glycol (MIRALAX / GLYCOLAX) packet 17 g, 17 g, Oral, Daily PRN, Gladys Damme, MD, 17 g at 09/21/19 1631 .  sodium chloride flush (NS) 0.9 % injection 3 mL, 3 mL, Intravenous, Once, Gladys Damme, MD   Patients Current Diet:     Diet Order                      Diet - low sodium heart healthy  Diet renal/carb modified with fluid restriction Diet-HS Snack? Nothing; Fluid restriction: 1200 mL Fluid; Room service appropriate? 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  Independent   Indoor Mobility: Did the patient need assistance with walking from room to room (with or without device)? Independent   Stairs: Did the patient need assistance with internal or external stairs (with or without device)? Independent   Functional Cognition: Did the patient need help planning regular tasks such as shopping or remembering to take medications? Independent   Home Assistive Devices / Equipment Home Equipment: None   Prior Device Use: Indicate devices/aids used by the patient prior to current illness, exacerbation or injury? None of the above   Current Functional Level Cognition   Arousal/Alertness: Awake/alert Overall Cognitive Status: Impaired/Different from baseline Current Attention Level: Sustained Orientation Level: Oriented X4 Following Commands: Follows multi-step commands consistently Safety/Judgement: Decreased awareness of deficits General Comments: requires multimodal cuing for execution of tasks, performs well if explained thoroughly Attention: Focused, Sustained Focused Attention: Impaired Focused Attention Impairment: Verbal complex(Backward digit span: 1/2) Sustained Attention: Impaired Sustained Attention Impairment: Verbal complex Memory: Impaired Memory Impairment: Storage deficit, Decreased recall of new information, Retrieval deficit(Immediate: 3/5; delayed: 0/5 with cues: 1/5) Awareness: Impaired Awareness Impairment: Emergent impairment Problem Solving:  Impaired Problem Solving Impairment: Verbal complex Executive Function: Sequencing Sequencing: Impaired Sequencing Impairment: Verbal complex(Clock drawing: 2/4)    Extremity Assessment (includes Sensation/Coordination)   Upper Extremity Assessment: LUE deficits/detail LUE Deficits / Details: 3-/5 FF shoulder, 3+/5 elbow and distal, slow but coordinated  LUE Sensation: WNL LUE Coordination: decreased gross motor  Lower Extremity Assessment: Defer to PT evaluation RLE Deficits / Details: AROM/strength/coordination WFL LLE Deficits / Details: AAROM WFL (requires assist due to weakness); hip flexion 3-, knee extension 3-, ankle DF 3- LLE Sensation: WNL LLE Coordination: (unable to accurately assess due to weakness)     ADLs   Overall ADL's : Needs assistance/impaired Grooming: Set up, Sitting Upper Body Bathing: Minimal assistance, Sitting Lower Body Bathing: Maximal assistance, Sit to/from stand Upper Body Dressing : Moderate assistance, Sitting Lower Body Dressing: Maximal assistance, Sit to/from stand Lower Body Dressing Details (indicate cue type and reason): unable to don socks today, posterior lean dynamically when attempting; sit to stand min assist  Toilet Transfer Details (indicate cue type and reason): deferred due to dizziness  Functional mobility during ADLs: Minimal assistance General ADL Comments: pt limited by dizziness, L sided weakness and slow processing      Mobility   Overal bed mobility: Needs Assistance Bed Mobility: Supine to Sit, Sit to Supine Supine to sit: Mod assist, HOB elevated Sit to supine: Mod assist, HOB elevated General bed mobility comments: mod assist for supine<>sit for LLE and truncal management, scooting to and from EOB with cuing for weight shifting L and R to aid in scooting.     Transfers  Overall transfer level: Needs assistance Equipment used: 1 person hand held assist Transfers: Sit to/from Stand Sit to Stand: Min assist General  transfer comment: Min assist for power up, steadying upon standing. Pt stood ~10 seconds with HHA, limited by dizziness and hypotension.     Ambulation / Gait / Stairs / Wheelchair Mobility   Ambulation/Gait Ambulation/Gait assistance: Herbalist (Feet): 10 Feet Assistive device: 1 person hand held assist(2nd hand holding onto end of bed when able) Gait Pattern/deviations: Step-to pattern, Decreased step length - left, Decreased stance time - left, Decreased dorsiflexion - left, Decreased weight shift to left, Steppage General Gait Details: unable this day due to symptomatic hypotension Gait velocity: very slow Gait velocity interpretation: <1.31 ft/sec, indicative of household ambulator     Posture / Balance Dynamic Sitting Balance Sitting balance - Comments: able to sit EOB without PT support Balance Overall balance assessment: Needs assistance Sitting-balance support: Feet supported, No upper extremity supported Sitting balance-Leahy Scale: Fair Sitting balance - Comments: able to sit EOB without PT support Postural control: Posterior lean Standing balance support: Single extremity supported, During functional activity Standing balance-Leahy Scale: Poor Standing balance comment: requires UE support     Special needs/care consideration Designated visitors are [place] and Denyse Amass Diabetic Management Hgb A1c 6.7 Outpatient hemodialysis at [facility] MWF; patient drives self    Previous Home Environment  Living Arrangements: Children(Has 31 year old son, Denyse Amass, at home, Daughter, Robyne Askew is Lawyer)  Lives With: Son Available Help at Discharge: Family, Available 24 hours/day Type of Home: Apartment Home Layout: One level Home Access: Stairs to enter Entrance Stairs-Rails: Right, Left, Can reach both Entrance Stairs-Number of Steps: 5 Bathroom Shower/Tub: Tub/shower unit, Architectural technologist: Standard Bathroom Accessibility: Yes How Accessible: Accessible via walker Home  Care Services: No Additional Comments: daughter nearby and can assist    Discharge Living Setting Plans for Discharge Living Setting: Patient's home, Lives with (comment), Apartment(28 year old son, Denyse Amass) Type of Home at Discharge: Apartment Discharge Home Layout: One level Discharge Home Access: Stairs to enter Entrance Stairs-Rails: Right, Left, Can reach both Entrance Stairs-Number of Steps: 5 Discharge Bathroom Shower/Tub: Tub/shower unit Discharge Bathroom Toilet: Standard Discharge Bathroom Accessibility: Yes How Accessible: Accessible via walker Does the patient have any problems obtaining your medications?: No   Social/Family/Support Systems Patient Roles: Parent(fulltime employee at [employer]) Contact Information: daughter, Education officer, museum Anticipated Caregiver: daughter and son Anticipated Caregiver's Contact Information: see above Ability/Limitations of Caregiver: son and daughter work and school but will arrange schedule Caregiver Availability: 24/7 Discharge Plan Discussed with Primary Caregiver: Yes Is Caregiver In Agreement with Plan?: Yes Does Caregiver/Family have Issues with Lodging/Transportation while Pt is in Rehab?: No   Goals Patient/Family Goal for Rehab: Mod I to supervision with PT, OT, and SLP Expected length of stay: ELOS 9 to 13 days Pt/Family Agrees to Admission and willing to participate: Yes Program Orientation Provided & Reviewed with Pt/Caregiver Including Roles  & Responsibilities: Yes   Decrease burden of Care through IP rehab admission: n/a   Possible need for SNF placement upon discharge:not anticipated   Patient Condition: This patient's medical and functional status has changed since the consult dated: 09/20/19 in which the Rehabilitation Physician determined and documented that the patient's condition is appropriate for intensive rehabilitative care in an inpatient rehabilitation facility. See "History of Present Illness" (above) for  medical update. Functional changes are: some increased assist with bed mobility from Min G to mod A; due to symptomatic hypotension,  pt unable to progress gait during most recent therapy note. Since consult note, pt has also been evaluated by OT and requires Min/Max for basic ADLs. Patient's medical and functional status update has been discussed with the Rehabilitation physician and patient remains appropriate for inpatient rehabilitation. Will admit to inpatient rehab today.   Preadmission Screen Completed By: Danne Baxter, RN MSN  Raechel Ache, OT, 09/23/2019 12:48 PM  with brief updates by Raechel Ache, OTR/L 09/23/19 10:55AM ______________________________________________________________________   Discussed status with Dr. Dagoberto Ligas on 09/23/19 at 10:58AM and received approval for admission today.   Admission Coordinator: Danne Baxter, RN MSN;  Raechel Ache OTR/L with day of admit updates completed by Raechel Ache at time 10:58AM Sudie Grumbling 09/23/19             Cosigned by: Courtney Heys, MD at 09/23/2019  1:44 PM  Revision History

## 2019-09-23 NOTE — Progress Notes (Signed)
Patient admitted to 4W02 via hospital bed with nurse and NT. Patient in good spirits and optimistic about therapy. Oriented to room, unit and stroke resource book.

## 2019-09-23 NOTE — Progress Notes (Addendum)
Occupational Therapy Treatment Patient Details Name: [name] MRN: [id number] DOB: July 03, 1960 Today's Date: 09/23/2019    History of present illness 59 y.o. female presenting with left-sided weakness and facial droop. PMH is significant for HTN, ESRD with HD MWF, DMT2. CT head showed no hemorrhage, CTA showed no LVO or significant stenosis. Patient was offered tPA by neurology, but declined. MRI brain 1 cm acute infarction in the left cerebellum. Subcentimeter acute infarction in the deep white matter just posterior to the atrium of the right lateral ventricle. Old rt frontal infarct   OT comments  Patient continues to make steady progress towards goals in skilled OT session. Patient's session limited to date due to continued orthstatics (see numbers below). Continued education provided with regard to gaze stabilization techniques, with excellent demonstration provided by pt. Upon standing, pt reports significant dizziness in L eye " spinning like those old time tops" upon standing, pt had to sit down due to dizziness. When L was self-occluded, dizziness did not appear to subside per pt report. Pt returned back to bed with all needs met; will continue to follow acutely.   Supine BP: 174/82 Sitting BP: 99/40 Standing BP: 95/48 (however unable to maintain standing for accuracy due to dizziness)   Follow Up Recommendations  CIR;Supervision/Assistance - 24 hour    Equipment Recommendations  Other (comment)(Defer to next venue)    Recommendations for Other Services      Precautions / Restrictions Precautions Precautions: Fall Precaution Comments: L hemiparesis/watch BP Restrictions Weight Bearing Restrictions: No       Mobility Bed Mobility Overal bed mobility: Needs Assistance Bed Mobility: Supine to Sit;Sit to Supine     Supine to sit: Mod assist;HOB elevated Sit to supine: Mod assist;HOB elevated   General bed mobility comments: mod assist for supine<>sit for LLE and  truncal management, scooting to and from EOB with cuing for weight shifting L and R to aid in scooting.  Transfers Overall transfer level: Needs assistance Equipment used: 1 person hand held assist Transfers: Sit to/from Stand Sit to Stand: Min assist         General transfer comment: Min assist for power up, steadying upon standing. Pt stood ~15 seconds with HHA, limited by dizziness and hypotension, was unable to take BP in standing due to dizziness    Balance Overall balance assessment: Needs assistance Sitting-balance support: Feet supported;No upper extremity supported Sitting balance-Leahy Scale: Fair Sitting balance - Comments: unable to challenge sitting balance due to dizziness   Standing balance support: Single extremity supported;During functional activity Standing balance-Leahy Scale: Poor Standing balance comment: requires UE support                           ADL either performed or assessed with clinical judgement   ADL Overall ADL's : Needs assistance/impaired                           Toilet Transfer Details (indicate cue type and reason): deferred due to dizziness          Functional mobility during ADLs: Minimal assistance General ADL Comments: pt limited by dizziness, L sided weakness and slow processing      Vision       Perception     Praxis      Cognition Arousal/Alertness: Awake/alert Behavior During Therapy: Anxious;WFL for tasks assessed/performed Overall Cognitive Status: Impaired/Different from baseline Area of Impairment: Awareness;Problem solving;Attention;Following commands;Safety/judgement  Current Attention Level: Sustained   Following Commands: Follows multi-step commands consistently Safety/Judgement: Decreased awareness of deficits Awareness: Emergent Problem Solving: Slow processing;Decreased initiation;Difficulty sequencing;Requires verbal cues;Requires tactile cues General  Comments: Continues to progress, however requires multi-modal cues for multi-step tasks, but is heeding to one step commands with increased clarity        Exercises     Shoulder Instructions       General Comments      Pertinent Vitals/ Pain       Pain Assessment: Faces Faces Pain Scale: Hurts a little bit Pain Location: LUE, since CVA Pain Descriptors / Indicators: Sore;Discomfort Pain Intervention(s): Limited activity within patient's tolerance;Monitored during session;Repositioned  Home Living                                          Prior Functioning/Environment              Frequency  Min 3X/week        Progress Toward Goals  OT Goals(current goals can now be found in the care plan section)  Progress towards OT goals: Progressing toward goals  Acute Rehab OT Goals Patient Stated Goal: to get better and get back to work OT Goal Formulation: With patient Time For Goal Achievement: 10/05/19 Potential to Achieve Goals: Good  Plan Discharge plan remains appropriate    Co-evaluation                 AM-PAC OT "6 Clicks" Daily Activity     Outcome Measure   Help from another person eating meals?: A Little Help from another person taking care of personal grooming?: A Little Help from another person toileting, which includes using toliet, bedpan, or urinal?: A Lot Help from another person bathing (including washing, rinsing, drying)?: A Lot Help from another person to put on and taking off regular upper body clothing?: A Lot Help from another person to put on and taking off regular lower body clothing?: A Lot 6 Click Score: 14    End of Session    OT Visit Diagnosis: Other abnormalities of gait and mobility (R26.89);Dizziness and giddiness (R42);Other symptoms and signs involving the nervous system (R29.898);Muscle weakness (generalized) (M62.81)   Activity Tolerance Patient tolerated treatment well   Patient Left in bed;with  call bell/phone within reach;with bed alarm set   Nurse Communication Mobility status;Other (comment)(Orthostatics)        Time: 9861-4830 OT Time Calculation (min): 20 min  Charges: OT General Charges $OT Visit: 1 Visit OT Treatments $Self Care/Home Management : 8-22 mins  Corinne Ports E. Glenden Rossell, COTA/L Acute Rehabilitation Services [place] 09/23/2019, 12:57 PM

## 2019-09-24 ENCOUNTER — Inpatient Hospital Stay (HOSPITAL_COMMUNITY): Payer: BC Managed Care – PPO | Admitting: Occupational Therapy

## 2019-09-24 ENCOUNTER — Inpatient Hospital Stay (HOSPITAL_COMMUNITY): Payer: BC Managed Care – PPO

## 2019-09-24 ENCOUNTER — Inpatient Hospital Stay (HOSPITAL_COMMUNITY): Payer: BC Managed Care – PPO | Admitting: Speech Pathology

## 2019-09-24 DIAGNOSIS — Z8673 Personal history of transient ischemic attack (TIA), and cerebral infarction without residual deficits: Secondary | ICD-10-CM

## 2019-09-24 MED ORDER — NEPRO/CARBSTEADY PO LIQD
237.0000 mL | Freq: Three times a day (TID) | ORAL | Status: DC
  Administered 2019-09-24 – 2019-10-12 (×37): 237 mL via ORAL

## 2019-09-24 MED ORDER — HEPARIN SODIUM (PORCINE) 1000 UNIT/ML DIALYSIS
20.0000 [IU]/kg | INTRAMUSCULAR | Status: DC | PRN
  Filled 2019-09-24: qty 3

## 2019-09-24 MED ORDER — RENA-VITE PO TABS
1.0000 | ORAL_TABLET | Freq: Every day | ORAL | Status: DC
  Administered 2019-09-25 – 2019-10-11 (×18): 1 via ORAL
  Filled 2019-09-24 (×19): qty 1

## 2019-09-24 NOTE — IPOC Note (Signed)
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     Functional Problem List: Nursing Bladder, Bowel, Motor, Nutrition, Medication Management, Endurance, Sensory, Safety, Pain  PT Balance, Endurance, Motor, Pain, Perception, Safety  OT Balance, Cognition, Endurance, Motor, Pain, Perception  SLP Cognition  TR         Basic ADL's: OT Grooming, Bathing, Dressing, Toileting     Advanced  ADL's: OT Simple Meal Preparation     Transfers: PT Bed Mobility, Bed to Chair, Car  OT Toilet, Tub/Shower     Locomotion: PT Ambulation, Stairs     Additional Impairments: OT Fuctional Use of Upper Extremity  SLP Communication, Social Cognition expression Problem Solving, Memory, Attention, Awareness  TR      Anticipated Outcomes Item Anticipated Outcome  Self Feeding    Swallowing      Basic self-care  Mod I - Supervision A  Toileting  Supervision A   Bathroom Transfers Supervision A  Bowel/Bladder  Pt will manage bowel and bladdwe with min assist  Transfers  Supervision  Locomotion  Supervision  Communication  Mod I  Cognition  Supervision  Pain  Pt will manage pain at 4 or less on a scale of 0-10.  Safety/Judgment  Pt will remain free of falls with injury with min assist while in rehab   Therapy Plan: PT Intensity: Minimum of 1-2 x/day ,45 to 90 minutes PT Frequency: 5 out of 7 days PT Duration Estimated Length of Stay: 2-2.5 Weeks OT Intensity: Minimum of 1-2 x/day, 45 to 90 minutes OT Frequency: 5 out of 7 days OT Duration/Estimated Length of Stay: 2-2.5 weeks SLP Intensity: Minumum of 1-2 x/day, 30 to 90 minutes SLP Frequency: 3 to 5 out of 7 days SLP Duration/Estimated Length of Stay: 2-2.5 weeks   Due to the current state  of emergency, patients may not be receiving their 3-hours of Medicare-mandated therapy.   Team Interventions: Nursing Interventions Patient/Family Education, Bladder Management, Bowel Management, Disease Management/Prevention, Cognitive Remediation/Compensation, Discharge Planning, Medication Management, Pain Management  PT interventions Ambulation/gait training, Community reintegration, Neuromuscular re-education, DME/adaptive equipment instruction, Psychosocial support, Stair training, UE/LE Strength taining/ROM, Wheelchair propulsion/positioning, Training and development officer, Discharge planning, Functional electrical stimulation, Pain management, Therapeutic Activities, UE/LE Coordination activities, Cognitive remediation/compensation, Functional mobility training, Disease management/prevention, Patient/family education, Splinting/orthotics, Therapeutic Exercise, Visual/perceptual remediation/compensation  OT Interventions Cognitive remediation/compensation, Community reintegration, Discharge planning, DME/adaptive equipment instruction, Functional mobility training, Neuromuscular re-education, Pain management, Patient/family education, Psychosocial support, Self Care/advanced ADL retraining, Therapeutic Activities, Therapeutic Exercise, UE/LE Strength taining/ROM, UE/LE Coordination activities  SLP Interventions Cognitive remediation/compensation, Internal/external aids, Speech/Language facilitation, Therapeutic Activities, Environmental controls, Cueing hierarchy, Functional tasks, Patient/family education  TR Interventions    SW/CM Interventions Discharge Planning, Disease Management/Prevention, Psychosocial Support, Patient/Family Education   Barriers to Discharge MD  Medical stability  Nursing Decreased caregiver support, Medical stability    PT Home environment access/layout, Hemodialysis Lives in 2nd story apartment without elevator  OT      SLP Lack of/limited family support    SW  Inaccessible home environment, Hemodialysis([facility] [place] M-W-F mornings PTA) Insurance   Team Discharge Planning: Destination: PT-Home ,OT- Home , SLP-Home Projected Follow-up: PT-24 hour supervision/assistance, Home health PT, OT-  Outpatient OT, SLP-24 hour supervision/assistance, Home Health SLP Projected Equipment Needs: PT-To be determined, OT- To be determined, SLP-None recommended by SLP Equipment  Details: PT- , OT-  Patient/family involved in discharge planning: PT- Patient,  OT-Patient, SLP-Patient  MD ELOS: 14-18d Medical Rehab Prognosis:  Good Assessment:  59 year old right-handed female history of diet-controlled diabetes mellitus, hypertension and end-stage renal disease on hemodialysis Monday Wednesday Friday. Per chart review she lives with her 61 year old son independent prior to admission working full-time at [employer]. 1 level home 5 steps to entry. Multiple family in the area that works. Presented 09/18/2019 with acute onset of left-sided weakness facial droop and slurred speech. Cranial CT scan showed no acute findings. 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 CT angiogram of head and neck no large vessel or medium vessel occlusion. Patient did not receive TPA. MRI showed a 1 cm acute infarct in the left cerebellum. Subcentimeter acute infarct in the deep white matter just posterior to the atrium of the right lateral ventricle. Admission chemistries BUN 29, creatinine 7.97,troponin high-sensitivity 48-63hemoglobin 9.5. Echocardiogram with ejection fraction of 60% no wall motion abnormalities. Neurology follow-up maintained on aspirin and Plavix x3 weeks then Plavix alone. Subcutaneous heparin for DVT prophylaxis. Plan for 30-day cardiac event monitor as outpatient. Hemodialysis ongoing as per renal services.Permissive hypertension with bouts of tachycardia she was placed on Lopressor 50mg  twice daily 09/22/2019.Tolerating a  regular diet. Therapy evaluations completed and patient was admitted for a comprehensive rehab program.    See Team Conference Notes for weekly updates to the plan of care

## 2019-09-24 NOTE — Progress Notes (Signed)
Occupational Therapy Assessment and Plan  Patient Details  Name: [name] MRN: [id number] Date of Birth: Aug 10, 1960  OT Diagnosis: abnormal posture, ataxia, cognitive deficits, hemiplegia affecting non-dominant side and muscle weakness (generalized) Rehab Potential:   ELOS: 2-2.5 weeks   Today's Date: 09/24/2019 OT Individual Time: 0903-1000 OT Individual Time Calculation (min): 57 min     Problem List:  Patient Active Problem List   Diagnosis Date Noted  . 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[place])   . Leukocytosis   . Left-sided weakness   . Acute ischemic stroke ([place]) 09/19/2019  . Stroke ([place]) 09/18/2019  . Intractable nausea and vomiting 04/20/2019  . Atypical chest pain 04/02/2019  . Type 2 diabetes mellitus without complications ([place]) 40/98/1191  . Anemia in chronic kidney disease 02/19/2019  . ESRD (end stage renal disease) ([place]) 02/19/2019  . Secondary hyperparathyroidism of renal origin ([place]) 02/19/2019  . Coagulation defect, unspecified ([place]) 02/19/2019  . Venous (peripheral) insufficiency 08/06/2017  . Encounter for screening for respiratory tuberculosis 01/01/2016  . Hypertension   . 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 Diabetic gastroparesis ([place]) 08/09/2015  . Low back pain 06/09/2014  . Nausea and vomiting 01/18/2012  . Diarrhea, unspecified 01/18/2012  . Obstructive sleep apnea of adult 01/16/2012  . GERD (gastroesophageal reflux disease) 01/16/2012  . Osteoarthritis of both knees 01/16/2012  . Hyperlipidemia, unspecified 07/18/2011  . LEIOMYOMA, UTERUS 01/14/2007  . Morbid obesity ([place]) 07/03/2006  . Former smoker 07/03/2006  . Tension headache 07/03/2006    Past Medical History:  Past Medical History:  Diagnosis Date  . Accelerated hypertension   . Aortic atherosclerosis ([place])   . Arthritis of knee    bilateral  . Diabetes  mellitus    type 2 - no meds  . Diabetic gastroparesis ([place])   . Diverticulosis   . ESRD (end stage renal disease) ([place])   . Fibroid uterus   . GERD (gastroesophageal reflux disease)   . Hyperlipidemia   . Hypertension   . HYPERTENSION, BENIGN SYSTEMIC 07/03/2006        . Hypertensive emergency 02/05/2019  . Hypokalemia   . IDA (iron deficiency anemia)   . Nausea   . Renal disorder   . Umbilical hernia   . Wears dentures    Past Surgical History:  Past Surgical History:  Procedure Laterality Date  . AV FISTULA PLACEMENT Left 02/17/2019   Procedure: ARTERIOVENOUS (AV) FISTULA CREATION LEFT UPPER ARM;  Surgeon: Waynetta Sandy, MD;  Location: [facility];  Service: Vascular;  Laterality: Left;  . CATARACT EXTRACTION     right eye  . CESAREAN SECTION     x2  . CHOLECYSTECTOMY     laparoscopic  . FISTULA SUPERFICIALIZATION Left 04/06/2019   Procedure: FISTULA SUPERFICIALIZATION LEFT BRACHIOCEPHALIC;  Surgeon: Waynetta Sandy, MD;  Location: [facility];  Service: Vascular;  Laterality: Left;  Transposition left arm brachiocephalic fistula.  . IR FLUORO GUIDE CV LINE RIGHT  02/09/2019  . IR FLUORO GUIDE CV LINE RIGHT  03/05/2019  . IR US GUIDE VASC ACCESS RIGHT  02/09/2019  . MULTIPLE TOOTH EXTRACTIONS    . REDUCTION MAMMAPLASTY Bilateral     Assessment & Plan Clinical Impression: [name]. [name] is a 59 year old right-handed female history of diet-controlled diabetes mellitus, hypertension and end-stage renal disease on hemodialysis Monday Wednesday Friday. Per chart review she lives with her 19 year old son independent prior to admission working  full-time at [employer]. 1 level home 5 steps to entry. Multiple family in the area that works. Presented 09/18/2019 with acute onset of left-sided weakness facial droop and slurred speech. Cranial CT scan showed no acute findings. 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 CT angiogram of head and neck no large  vessel or medium vessel occlusion. Patient did not receive TPA. MRI showed a 1 cm acute infarct in the left cerebellum. Subcentimeter acute infarct in the deep white matter just posterior to the atrium of the right lateral ventricle. Admission chemistries BUN 29, creatinine 7.97,troponin high-sensitivity 48-63hemoglobin 9.5. Echocardiogram with ejection fraction of 60% no wall motion abnormalities. Neurology follow-up maintained on aspirin and Plavix x3 weeks then Plavix alone. Subcutaneous heparin for DVT prophylaxis. Plan for 30-day cardiac event monitor as outpatient. Hemodialysis ongoing as per renal services.Permissive hypertension with bouts of tachycardia she was placed on Lopressor 24m twice daily 09/22/2019.Tolerating a regular diet. Therapy evaluations completed and patient was admitted for a comprehensive rehab program. Patient transferred to CIR on 09/23/2019 .    Patient currently requires mod with basic self-care skills secondary to decreased cardiorespiratoy endurance, impaired timing and sequencing, abnormal tone, unbalanced muscle activation, ataxia, decreased coordination and decreased motor planning and decreased initiation, decreased attention, decreased awareness, decreased safety awareness and delayed processing.  Prior to hospitalization, patient could complete BADLs/IADLs with independece. Patient was working full-time and driving.   Patient will benefit from skilled intervention to decrease level of assist with basic self-care skills and increase independence with basic self-care skills prior to discharge home with care partner.  Anticipate patient will require 24 hour supervision and follow up outpatient.  OT - End of Session Activity Tolerance: Tolerates 30+ min activity with multiple rests Endurance Deficit: Yes OT Assessment OT Patient demonstrates impairments in the following area(s): Balance;Cognition;Endurance;Motor;Pain;Perception OT Basic ADL's Functional  Problem(s): Grooming;Bathing;Dressing;Toileting OT Advanced ADL's Functional Problem(s): Simple Meal Preparation OT Transfers Functional Problem(s): Toilet;Tub/Shower OT Additional Impairment(s): Fuctional Use of Upper Extremity OT Plan OT Intensity: Minimum of 1-2 x/day, 45 to 90 minutes OT Frequency: 5 out of 7 days OT Duration/Estimated Length of Stay: 2-2.5 weeks OT Treatment/Interventions: Cognitive remediation/compensation;Community reintegration;Discharge planning;DME/adaptive equipment instruction;Functional mobility training;Neuromuscular re-education;Pain management;Patient/family education;Psychosocial support;Self Care/advanced ADL retraining;Therapeutic Activities;Therapeutic Exercise;UE/LE Strength taining/ROM;UE/LE Coordination activities OT Basic Self-Care Anticipated Outcome(s): Mod I - Supervision A OT Toileting Anticipated Outcome(s): Supervision A OT Bathroom Transfers Anticipated Outcome(s): Supervision A OT Recommendation Recommendations for Other Services: Speech consult;Neuropsych consult Patient destination: Home Follow Up Recommendations: Outpatient OT Equipment Recommended: To be determined   Skilled Therapeutic Intervention Patient met lying supine in bed. OT role, rehab process, ELOS and POC discussed with patient expressing verbal understanding. Supine to EOB and sit to stand without use of AE with Min A. Functional mobility from EOB to sink in bathroom with Min A. Patient occasionally reaching out for furniture/walls for stability. Walk-in shower transfer with Min A via s[place]ota Seated on TTB, patient completed UB bathing with Min A and LB bathing in sitting standing with Mod A. Cueing required for incorporation of LUE. Patient able to attain figure-4 position to cross RLE over LLE but notes pain in L hip with crossing of LLE over RLE requiring assistance to maintain position when washing LLE. Seated at EOB, patient completed UB/LB dressing with Mod A after  education on hemi-dressing techniques. Session concluded with patient lying supine in bed with call bell within reach, bed alarm activated, and all needs met. Patient would benefit from continued OT services with focus  on LUE NMR, standing balance, activity tolerance, and self-care re-education with/without use of AE.    OT Evaluation Precautions/Restrictions  Precautions Precautions: Fall Precaution Comments: L hemiparesis/watch BP Restrictions Weight Bearing Restrictions: No General Chart Reviewed: Yes Family/Caregiver Present: No Vital Signs Therapy Vitals Temp: (!) 97.5 F (36.4 C) Temp Source: Oral Pulse Rate: 83 Resp: 17 BP: 119/65 Patient Position (if appropriate): Lying Oxygen Therapy SpO2: 99 % O2 Device: Room Air Pain Pain Assessment Pain Scale: 0-10 Pain Score: 0-No pain Home Living/Prior Functioning Home Living Family/patient expects to be discharged to:: Private residence Living Arrangements: Children(son) Available Help at Discharge: Family, Available 24 hours/day Type of Home: Apartment Home Access: Stairs to enter [employer] of Steps: ~20 Entrance Stairs-Rails: Right, Left, Can reach both Home Layout: One level Bathroom Shower/Tub: Tub/shower unit, Architectural technologist: Standard Bathroom Accessibility: Yes Additional Comments: daughter nearby and can assist   Lives With: Son IADL History Homemaking Responsibilities: Yes Meal Prep Responsibility: Primary Laundry Responsibility: Primary Cleaning Responsibility: Primary Bill Paying/Finance Responsibility: Primary Shopping Responsibility: Primary Current License: Yes Mode of Transportation: (SUV) Occupation: Full time employment Type of Occupation: Houekeeping at [employer] Prior Function Level of Independence: Independent with basic ADLs, Independent with transfers, Independent with homemaking with ambulation, Independent with gait  Able to Take Stairs?: Yes Driving: Yes Vocation: Full  time employment 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 ADL ADL Upper Body Bathing: Minimal assistance Where Assessed-Upper Body Bathing: Shower Lower Body Bathing: Moderate assistance Where Assessed-Lower Body Bathing: Shower Upper Body Dressing: Moderate assistance Where Assessed-Upper Body Dressing: Edge of bed Lower Body Dressing: Moderate assistance Where Assessed-Lower Body Dressing: Edge of bed Social research officer, government: Moderate cueing, Minimal assistance Social research officer, government Method: Radiographer, therapeutic: Gaffer Baseline Vision/History: No visual deficits Patient Visual Report: No change from baseline Vision Assessment?: Yes Eye Alignment: Within Functional Limits Ocular Range of Motion: Within Functional Limits Alignment/Gaze Preference: Within Defined Limits Tracking/Visual Pursuits: Able to track stimulus in all quads without difficulty Perception  Perception: Impaired Comments: Requires cueing for use of LUE in functional tasks Praxis Praxis: Intact Cognition Overall Cognitive Status: Impaired/Different from baseline Arousal/Alertness: Awake/alert Orientation Level: Person;Place;Situation Person: Oriented Place: Oriented Situation: Oriented Year: 2021 Month: May Day of Week: Correct Memory: Impaired Memory Impairment: Storage deficit;Decreased recall of new information;Retrieval deficit Immediate Memory Recall: Sock;Blue;Bed Memory Recall Sock: Without Cue Memory Recall Blue: With Cue Memory Recall Bed: Without Cue Attention: Focused;Sustained Focused Attention: Impaired Focused Attention Impairment: Verbal complex Sustained Attention: Impaired Sustained Attention Impairment: Verbal complex Awareness: Impaired Awareness Impairment: Emergent impairment Problem Solving: Impaired Problem Solving Impairment: Verbal complex;Functional complex Executive Function: Sequencing Sequencing:  Impaired Sequencing Impairment: Verbal complex;Functional complex Safety/Judgment: Appears intact Sensation Sensation Light Touch: Appears Intact Coordination Gross Motor Movements are Fluid and Coordinated: No Fine Motor Movements are Fluid and Coordinated: No Coordination and Movement Description: Very slow movements. Finger Nose Finger Test: Impaired Motor  Motor Motor: Motor apraxia Motor - Skilled Clinical Observations: Difficulty with motor planning on L side. Very delayed. Mobility  Bed Mobility Bed Mobility: Supine to Sit;Sit to Supine;Rolling Right Rolling Right: Minimal Assistance - Patient > 75% Supine to Sit: Minimal Assistance - Patient > 75% Sit to Supine: Minimal Assistance - Patient > 75% Transfers Sit to Stand: Minimal Assistance - Patient > 75% Stand to Sit: Minimal Assistance - Patient > 75%  Trunk/Postural Assessment  Cervical Assessment Cervical Assessment: Within Functional Limits Thoracic Assessment Thoracic Assessment: Within Functional Limits Lumbar Assessment Lumbar  Assessment: Within Functional Limits Postural Control Postural Control: Deficits on evaluation(Delayed)  Balance Balance Balance Assessed: Yes Static Sitting Balance Static Sitting - Balance Support: No upper extremity supported Static Sitting - Level of Assistance: 5: Stand by assistance Dynamic Sitting Balance Dynamic Sitting - Balance Support: No upper extremity supported Dynamic Sitting - Level of Assistance: 5: Stand by assistance Static Standing Balance Static Standing - Balance Support: Left upper extremity supported Static Standing - Level of Assistance: 4: Min assist Dynamic Standing Balance Dynamic Standing - Balance Support: Bilateral upper extremity supported Dynamic Standing - Level of Assistance: 4: Min assist Extremity/Trunk Assessment RUE Assessment RUE Assessment: Within Functional Limits Passive Range of Motion (PROM) Comments: WFL Active Range of Motion  (AROM) Comments: WFL General Strength Comments: 4-/5 LUE Assessment LUE Assessment: Exceptions to [facility] Passive Range of Motion (PROM) Comments: WFL Active Range of Motion (AROM) Comments: Can initiate shoulder flex/ext, abd/add, elbow flex/ext, but unable to achieve full ROM. Can flex/extend digits with increased time. General Strength Comments: 3-/5     Refer to Care Plan for Long Term Goals  Recommendations for other services: Neuropsych   Discharge Criteria: Patient will be discharged from OT if patient refuses treatment 3 consecutive times without medical reason, if treatment goals not met, if there is a change in medical status, if patient makes no progress towards goals or if patient is discharged from hospital.  The above assessment, treatment plan, treatment alternatives and goals were discussed and mutually agreed upon: by patient  Aricka Goldberger R [place] 09/24/2019, 12:33 PM

## 2019-09-24 NOTE — Progress Notes (Signed)
[place] PHYSICAL MEDICINE & REHABILITATION PROGRESS NOTE   Subjective/Complaints: No issues overnight.  Some warmth with urination which is not usual for her.  She urinates infrequently due to renal disease.  She denies any pain with urination.  No sweats or chills Review of systems negative for chest pain shortness of breath nausea vomiting diarrhea or constipation Objective:   No results found. Recent Labs    09/22/19 0443 09/23/19 0244  WBC 16.4* 17.2*  HGB 11.1* 11.0*  HCT 38.1 39.0  PLT 581* 551*   Recent Labs    09/22/19 0443 09/23/19 0244  NA 140 135  K 4.8 4.7  CL 97* 93*  CO2 27 26  GLUCOSE 128* 137*  BUN 43* 33*  CREATININE 10.24* 7.33*  CALCIUM 9.7 9.8    Intake/Output Summary (Last 24 hours) at 09/24/2019 0706 Last data filed at 09/23/2019 1743 Gross per 24 hour  Intake 240 ml  Output --  Net 240 ml     Physical Exam: Vital Signs Blood pressure 94/65, pulse 81, temperature 97.6 F (36.4 C), temperature source Oral, resp. rate 16, height 5' 9.02" (1.753 m), weight 101.9 kg, SpO2 100 %.   General: No acute distress Mood and affect are appropriate Heart: Regular rate and rhythm no rubs murmurs or extra sounds Lungs: Clear to auscultation, breathing unlabored, no rales or wheezes Abdomen: Positive bowel sounds, soft nontender to palpation, nondistended Extremities: No clubbing, cyanosis, or edema Skin: No evidence of breakdown, no evidence of rash Neurologic: Cranial nerves II through XII intact, motor strength is 5/5 in Right 3-/5 Left  deltoid, bicep, tricep, grip, hip flexor, knee extensors, ankle dorsiflexor and plantar flexor Sensory exam normal sensation to light touch and proprioception in bilateral upper and lower extremities Cerebellar exam normal finger to nose to finger as well as heel to shin in right  upper and lower extremities Limited by weakness on Left side  Musculoskeletal: No pain with upper limb or lower limb active assistive  range of motion   Assessment/Plan: 1. 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  Physiatrist is providing close team supervision and 24 hour management of active medical problems listed below.  Physiatrist and rehab team continue to assess barriers to discharge/monitor patient progress toward functional and medical goals  Care Tool:  Bathing              Bathing assist       Upper Body Dressing/Undressing Upper body dressing   What is the patient wearing?: Hospital gown only    Upper body assist      Lower Body Dressing/Undressing Lower body dressing      What is the patient wearing?: Hospital gown only, Incontinence brief     Lower body assist       Toileting Toileting    Toileting assist       Transfers Chair/bed transfer  Transfers assist           Locomotion Ambulation   Ambulation assist              Walk 10 feet activity   Assist           Walk 50 feet activity   Assist           Walk 150 feet activity   Assist  Walk 10 feet on uneven surface  activity   Assist           Wheelchair     Assist               Wheelchair 50 feet with 2 turns activity    Assist            Wheelchair 150 feet activity     Assist          Blood pressure 94/65, pulse 81, temperature 97.6 F (36.4 C), temperature source Oral, resp. rate 16, height 5' 9.02" (1.753 m), weight 101.9 kg, SpO2 100 %.  Medical Problem List and Plan: 1.Left-sided weakness and dysarthriasecondary to 2 small infarcts left cerebellar deep nucleusand posterior to the atrium of the right lateral ventricle most likely separate small vessel disease. Plan 30-day cardiac event monitor as  outpatient -patient may shower -ELOS/Goals: 2-2.5 weeks; goals supervision to mod I 2. Antithrombotics: -DVT/anticoagulation:Subcutaneous heparin -antiplatelet therapy: Aspirin and Plavix x3 weeks then Plavix alone 3. Pain Management:Tylenol as needed 4. Mood:Provide emotional support -antipsychotic agents: N/A 5. Neuropsych: This patientiscapable of making decisions on herown behalf. 6. Skin/Wound Care:Routine skin checks 7. Fluids/Electrolytes/Nutrition:Routine in and outs with follow-up chemistries 8. End-stage renal disease. Continue hemodialysis as directed 9. Hyperlipidemia. Lipitor 10. Diet controlled diabetes mellitus. Hemoglobin A1c 6.7. Blood sugar checks currently discontinued  11. Permissive hypertension/tachycardia. Lopressor initiated 50mg  twice daily 09/22/2019. Monitor with increased mobility and titrate as required.   Vitals:   09/23/19 1923 09/24/19 0541  BP: 140/86 94/65  Pulse: 97 81  Resp:  16  Temp: 97.9 F (36.6 C) 97.6 F (36.4 C)  SpO2: 100% 100%  Labile check orthostatic vitals  LOS: 1 days A FACE TO FACE EVALUATION WAS PERFORMED  Charlett Blake 09/24/2019, 7:06 AM
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
## 2019-09-24 NOTE — Progress Notes (Signed)
Inpatient Rehabilitation  Patient information reviewed and entered into eRehab system by Laelani Vasko M. Jamiaya Bina, M.A., CCC/SLP, PPS Coordinator.  Information including medical coding, functional ability and quality indicators will be reviewed and updated through discharge.    

## 2019-09-24 NOTE — Progress Notes (Signed)
[facility] Progress Note   Subjective:  Seen in room. Now in CIR, for 1st PT session shortly. No CP/dyspnea. For HD later today.  Objective Vitals:   09/23/19 1742 09/23/19 1745 09/23/19 1923 09/24/19 0541  BP:  123/72 140/86 94/65  Pulse:  86 97 81  Resp:  18  16  Temp:  97.7 F (36.5 C) 97.9 F (36.6 C) 97.6 F (36.4 C)  TempSrc:  Oral Oral Oral  SpO2:   100% 100%  Weight: 101.9 kg 101.9 kg    Height: 5\' 9"  (1.753 m) 5' 9.02" (1.753 m)     Physical Exam General:Ill appearing woman, NAD Heart:Tachycardic, no murmur Lungs:CTAB Abdomen:soft, non-tender Extremities:no LE edema Dialysis Access:LUE AVF + bruit  Additional Objective Labs: Basic Metabolic Panel: Recent Labs  Lab 09/21/19 0240 09/22/19 0443 09/23/19 0244  NA 139 140 135  K 4.7 4.8 4.7  CL 95* 97* 93*  CO2 23 27 26   GLUCOSE 129* 128* 137*  BUN 70* 43* 33*  CREATININE 14.17* 10.24* 7.33*  CALCIUM 9.8 9.7 9.8  PHOS 5.8* 5.7* 5.7*   CBC: Recent Labs  Lab 09/18/19 1227 09/18/19 1236 09/19/19 0359 09/19/19 0359 09/20/19 1324 09/20/19 1324 09/21/19 0240 09/22/19 0443 09/23/19 0244  WBC 9.6   < > 11.1*   < > 14.9*   < > 17.4*  16.9* 16.4* 17.2*  NEUTROABS 7.4  --   --   --   --   --  14.9*  --   --   HGB 9.5*   < > 8.9*   < > 12.2   < > 11.4*  11.1* 11.1* 11.0*  HCT 33.1*   < > 31.2*   < > 42.8   < > 40.0  39.7 38.1 39.0  MCV 79.6*   < > 78.8*  --  80.9  --  81.6  81.7 79.9* 80.7  PLT 468*   < > 503*   < > 582*   < > 632*  636* 581* 551*   < > = values in this interval not displayed.   Blood Culture    Component Value Date/Time   SDES BLOOD RIGHT HAND 09/20/2019 1856   SPECREQUEST  09/20/2019 1856    BOTTLES DRAWN AEROBIC ONLY Blood Culture adequate volume   CULT  09/20/2019 1856    NO GROWTH 4 DAYS Performed at [facility], [place] [address], [place], [place] [address]    REPTSTATUS PENDING 09/20/2019 1856   Medications:  . aspirin EC  81 mg Oral Daily   . atorvastatin  40 mg Oral Daily  . clopidogrel  75 mg Oral Daily  . ferric citrate  420 mg Oral TID WC  . heparin  5,000 Units Subcutaneous Q8H  . metoprolol tartrate  50 mg Oral BID  . ondansetron  4 mg Oral Q12H    Dialysis Orders: [facility] 4h 400/800 105.5kg 3K/2.25 bath UFP #3 LUE AVF Hep 2000 - Hectoral 19mcg Iv q HD - Mircera 246mcg IV q 2 weeks (last 5/10) - Venofer 100 x 10 ordered --> for Hgb 8.5, tsat 7% on 5/10.   Assessment/Plan: 1. Acute L cerebellar + subcentimeter deep white matter R NHA:[id number] L sided weakness and facial droop, improving. Echo normal. CTA without carotid or posterior circulation stenoses. Prior CVA on imaging that she was unaware of. Neuro followed, plan is for 30d event monitor to r/o A-fib. 2. Disability: Now transitioned to CIR - PT/OT. 3. ESRD:Continue HD per MWF schedule-  next 5/21. 4. Hypertension/volume:Permissive HTN w/ acute CVA.Below EDW - attempt to lower further. HR improved now that back on metoprolol. 5. Anemia(ESRD + IDA):Mircera 225 just given 5/10. Last tsat low - has tolerated Venofer as OP despite Fe allergy - waiting onIV iron here. 6. Metabolic bone disease:Ca ok, [place].Resumed home binder Lorin Picket). 7. T2DM 8. Hyperlipidemia: Prev on statin - off for unknown time, resumed here. 9. Leukocytosis:WBC rising - unclear etiology. Per primary. 10. Gastroparesis: Chronic  issue.  Veneta Penton, PA-C 09/24/2019, 9:18 AM  [employer]

## 2019-09-24 NOTE — Plan of Care (Signed)
  Problem: Consults Goal: RH STROKE PATIENT EDUCATION Description: See Patient Education module for education specifics  Outcome: Progressing   Problem: RH BOWEL ELIMINATION Goal: RH STG MANAGE BOWEL WITH ASSISTANCE Description: STG Manage Bowel with [place]. Outcome: Progressing   Problem: RH BLADDER ELIMINATION Goal: RH STG MANAGE BLADDER WITH ASSISTANCE Description: STG Manage Bladder With Min Assistance Outcome: Progressing   Problem: RH SKIN INTEGRITY Goal: RH STG SKIN FREE OF INFECTION/BREAKDOWN Description: No new breakdown with min assistance/cues from staff  Outcome: Progressing   Problem: RH SAFETY Goal: RH STG ADHERE TO SAFETY PRECAUTIONS W/ASSISTANCE/DEVICE Description: STG Adhere to Safety Precautions With Min Assistance/Device. Outcome: Progressing   Problem: RH PAIN MANAGEMENT Goal: RH STG PAIN MANAGED AT OR BELOW PT'S PAIN GOAL Description: <3 out of 10.  Outcome: Progressing   Problem: RH KNOWLEDGE DEFICIT Goal: RH STG INCREASE KNOWLEDGE OF HYPERTENSION Description: Pt will verbalize management of hypertension including medications and stress management with handouts and cues  Outcome: Progressing Goal: RH STG INCREASE KNOWLEDGE OF STROKE PROPHYLAXIS Description: Pt will be able to verbalize stroke prevention techniques with min assistance, cues, and educational material.  Outcome: Progressing

## 2019-09-24 NOTE — Progress Notes (Signed)
Patient had a yellow mews as I came onto shift. Patient assessed, in no distress and alert and oriented x4. Charge nurse aware. Will recheck vitals when patient returns from therapy.

## 2019-09-24 NOTE — Evaluation (Signed)
Physical Therapy Assessment and Plan  Patient Details  Name: [name] MRN: [id number] Date of Birth: 03-Feb-1961  PT Diagnosis: Difficulty walking, Hemiplegia non-dominant and Muscle weakness Rehab Potential: Good ELOS: 2-2.5 Weeks   Today's Date: 09/24/2019 PT Individual Time: 1002-1100 PT Individual Time Calculation (min): 58 min    Problem List:  Patient Active Problem List   Diagnosis Date Noted  . 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[place])   . Leukocytosis   . Left-sided weakness   . Acute ischemic stroke ([place]) 09/19/2019  . Stroke ([place]) 09/18/2019  . Intractable nausea and vomiting 04/20/2019  . Atypical chest pain 04/02/2019  . Type 2 diabetes mellitus without complications ([place]) 89/38/1017  . Anemia in chronic kidney disease 02/19/2019  . ESRD (end stage renal disease) ([place]) 02/19/2019  . Secondary hyperparathyroidism of renal origin ([place]) 02/19/2019  . Coagulation defect, unspecified ([place]) 02/19/2019  . Venous (peripheral) insufficiency 08/06/2017  . Encounter for screening for respiratory tuberculosis 01/01/2016  . Hypertension   . 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 Diabetic gastroparesis ([place]) 08/09/2015  . Low back pain 06/09/2014  . Nausea and vomiting 01/18/2012  . Diarrhea, unspecified 01/18/2012  . Obstructive sleep apnea of adult 01/16/2012  . GERD (gastroesophageal reflux disease) 01/16/2012  . Osteoarthritis of both knees 01/16/2012  . Hyperlipidemia, unspecified 07/18/2011  . LEIOMYOMA, UTERUS 01/14/2007  . Morbid obesity ([place]) 07/03/2006  . Former smoker 07/03/2006  . Tension headache 07/03/2006    Past Medical History:  Past Medical History:  Diagnosis Date  . Accelerated hypertension   . Aortic atherosclerosis ([place])   . Arthritis of knee    bilateral  . Diabetes mellitus    type 2 - no meds  . Diabetic gastroparesis ([place])    . Diverticulosis   . ESRD (end stage renal disease) ([place])   . Fibroid uterus   . GERD (gastroesophageal reflux disease)   . Hyperlipidemia   . Hypertension   . HYPERTENSION, BENIGN SYSTEMIC 07/03/2006        . Hypertensive emergency 02/05/2019  . Hypokalemia   . IDA (iron deficiency anemia)   . Nausea   . Renal disorder   . Umbilical hernia   . Wears dentures    Past Surgical History:  Past Surgical History:  Procedure Laterality Date  . AV FISTULA PLACEMENT Left 02/17/2019   Procedure: ARTERIOVENOUS (AV) FISTULA CREATION LEFT UPPER ARM;  Surgeon: Waynetta Sandy, MD;  Location: [facility];  Service: Vascular;  Laterality: Left;  . CATARACT EXTRACTION     right eye  . CESAREAN SECTION     x2  . CHOLECYSTECTOMY     laparoscopic  . FISTULA SUPERFICIALIZATION Left 04/06/2019   Procedure: FISTULA SUPERFICIALIZATION LEFT BRACHIOCEPHALIC;  Surgeon: Waynetta Sandy, MD;  Location: [facility];  Service: Vascular;  Laterality: Left;  Transposition left arm brachiocephalic fistula.  . IR FLUORO GUIDE CV LINE RIGHT  02/09/2019  . IR FLUORO GUIDE CV LINE RIGHT  03/05/2019  . IR US GUIDE VASC ACCESS RIGHT  02/09/2019  . MULTIPLE TOOTH EXTRACTIONS    . REDUCTION MAMMAPLASTY Bilateral     Assessment & Plan Clinical Impression: Patient is a 59 year old right-handed female history of diet-controlled diabetes mellitus, hypertension and end-stage renal disease on hemodialysis Monday Wednesday Friday. Per chart review she lives with her 58 year old son independent prior to admission working full-time at [employer]. 1 level home 5 steps to  entry. Multiple family in the area that works. Presented 09/18/2019 with acute onset of left-sided weakness facial droop and slurred speech. Cranial CT scan showed no acute findings. 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 CT angiogram of head and neck no large vessel or medium vessel occlusion. Patient did not receive TPA. MRI  showed a 1 cm acute infarct in the left cerebellum. Subcentimeter acute infarct in the deep white matter just posterior to the atrium of the right lateral ventricle. Admission chemistries BUN 29, creatinine 7.97,troponin high-sensitivity 48-63hemoglobin 9.5. Echocardiogram with ejection fraction of 60% no wall motion abnormalities. Neurology follow-up maintained on aspirin and Plavix x3 weeks then Plavix alone. Subcutaneous heparin for DVT prophylaxis. Plan for 30-day cardiac event monitor as outpatient. Hemodialysis ongoing as per renal services.Permissive hypertension with bouts of tachycardia she was placed on Lopressor 3m twice daily 09/22/2019.Tolerating a regular diet. Patient transferred to CIR on 09/23/2019 .   Patient currently requires min with mobility secondary to muscle weakness, decreased cardiorespiratoy endurance, motor apraxia, decreased coordination and decreased motor planning, decreased attention to left and decreased sitting balance, decreased standing balance, decreased postural control, hemiplegia and decreased balance strategies.  Prior to hospitalization, patient was independent  with mobility and lived with [place] in a [place].  Home access is ~20  with BHRs.    Patient will benefit from skilled PT intervention to maximize safe functional mobility, minimize fall risk and decrease caregiver burden for planned discharge home with 24 hour supervision.  Anticipate patient [facility]  PT - End of Session Activity Tolerance: Tolerates 30+ min activity with multiple rests Endurance Deficit: Yes Endurance Deficit Description: Requires multiple rest breaks during session PT Assessment Rehab Potential (ACUTE/IP ONLY): Good PT Barriers to Discharge: Home environment access/layout;Hemodialysis PT Barriers to Discharge Comments: Lives in 2nd story apartment without elevator PT Patient demonstrates impairments in the following area(s):  Balance;Endurance;Motor;Pain;Perception;Safety PT Transfers Functional Problem(s): Bed Mobility;Bed to Chair;Car PT Locomotion Functional Problem(s): Ambulation;Stairs PT Plan PT Intensity: Minimum of 1-2 x/day ,45 to 90 minutes PT Frequency: 5 out of 7 days PT Duration Estimated Length of Stay: 2-2.5 Weeks PT Treatment/Interventions: Ambulation/gait training;Community reintegration;Neuromuscular re-education;DME/adaptive equipment instruction;Psychosocial support;Stair training;UE/LE Strength taining/ROM;Wheelchair propulsion/positioning;Balance/vestibular training;Discharge planning;Functional electrical stimulation;Pain management;Therapeutic Activities;UE/LE Coordination activities;Cognitive remediation/compensation;Functional mobility training;Disease management/prevention;Patient/family education;Splinting/orthotics;Therapeutic Exercise;Visual/perceptual remediation/compensation PT Transfers Anticipated Outcome(s): Supervision PT Locomotion Anticipated Outcome(s): Supervision PT Recommendation Follow Up Recommendations: 24 hour supervision/assistance;Home health PT Patient destination: Home Equipment Recommended: To be determined  Skilled Therapeutic Intervention  Evaluation completed (see details above and below) with education on PT POC and goals and individual treatment initiated with focus on bed mobility, functional transfers, balance, ambulation, car transfers, and stair training.   Pt received supine in bed and agreeable to therapy. Reports soreness/tenderness pain in LUE and LLE. Number not provided. PT provides repositioning and rest breaks as needed to address pain symptoms during session. Supine to sit with increased time and supervision for safety. Squat [facility] and [facility] on hand placement and body mechanics, PT providing tactile facilitation at hips. WC transport to therapy gym for time management. Pt performs car transfer with stand pivot technique  and modA from PT with cuing on safe hand placement and body postioning. Pt ambulates up ramp 5' with RHR and PT providing L HHA, and pt verbalizing increased dizziness, requiring modA from PT to ambulate safely back [facility]. BP taken at 123/68. Pt ambulates 20' with  RW and minA with cues for upright gaze to improve posture and balance. Pt again verbalizes dizziness. BP at 125/75 but taken 1-2 minutes after pt verbalized symptoms. Extended seated rest break prior to performing 4 6" steps with BHRs and PT providing minA at hips and trunk with verbal cuing on sequencing and reminder of progression of LUE due to pt allowing L hand to drag behind on hand rail. WC transport back to room. Pt performs squat pivot back to bed with minA and return to supine with minA management of BLEs. Pt left supine in bed with alarm intact and all needs within reach.   PT Evaluation Precautions/Restrictions Precautions Precautions: Fall Precaution Comments: L hemiparesis/watch BP Restrictions Weight Bearing Restrictions: No General Chart Reviewed: Yes Family/Caregiver Present: No  Home Living/Prior Functioning Home Living Available Help at Discharge: Family Type of Home: Apartment Home Access: Stairs to enter Technical brewer of Steps: ~20 Entrance Stairs-Rails: Right;Left;Can reach both Home Layout: One level Additional Comments: daughter nearby and can assist   Lives With: Son Prior Function Level of Independence: Independent with basic ADLs;Independent with transfers;Independent with homemaking with ambulation;Independent with gait  Able to Take Stairs?: Yes Driving: Yes Vocation: Full time employment 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 Vision/Perception  Vision - Assessment Eye Alignment: Within Functional Limits Ocular Range of Motion: Within Functional Limits Alignment/Gaze Preference: Within Defined Limits Tracking/Visual Pursuits:  Able to track stimulus in all quads without difficulty Perception Perception: Impaired Comments: Requires curing for use of LUE during functional tasks. Praxis Praxis: Impaired Praxis Impairment Details: Motor planning  Cognition Overall Cognitive Status: Impaired/Different from baseline Arousal/Alertness: Awake/alert Orientation Level: Oriented X4 Safety/Judgment: Appears intact Sensation Sensation Light Touch: Appears Intact Coordination Gross Motor Movements are Fluid and Coordinated: No Fine Motor Movements are Fluid and Coordinated: No Coordination and Movement Description: Very slow movements. Motor  Motor Motor: Motor apraxia Motor - Skilled Clinical Observations: Difficulty with motor planning on L side. Very delayed.  Mobility Bed Mobility Bed Mobility: Supine to Sit;Sit to Supine;Rolling Right Rolling Right: Minimal Assistance - Patient > 75% Supine to Sit: Minimal Assistance - Patient > 75% Sit to Supine: Minimal Assistance - Patient > 75% Transfers Transfers: Sit to Stand;Stand to Sit;Stand Pivot Transfers;Squat Pivot Transfers Sit to Stand: Minimal Assistance - Patient > 75% Stand to Sit: Minimal Assistance - Patient > 75% Stand Pivot Transfers: Minimal Assistance - Patient > 75% Stand Pivot Transfer Details: Verbal cues for sequencing;Tactile cues for sequencing;Tactile cues for initiation;Tactile cues for posture;Verbal cues for technique;Tactile cues for placement Squat Pivot Transfers: Minimal Assistance - Patient > 75% Locomotion  Gait Ambulation: Yes Gait Assistance: Minimal Assistance - Patient > 75% Gait Distance (Feet): 20 Feet Assistive device: Rolling walker Gait Assistance Details: Verbal cues for sequencing;Verbal cues for gait pattern;Verbal cues for technique;Tactile cues for posture Gait Gait: Yes Gait Pattern: Impaired Gait Pattern: Decreased stance time - left;Poor foot clearance - left Gait velocity: very slow High Level Ambulation High  Level Ambulation: Backwards walking Backwards Walking: modA at trunk for backward walking Stairs / Additional Locomotion Stairs: Yes Stairs Assistance: Minimal Assistance - Patient > 75% Stair Management Technique: Two rails Number of Stairs: 4 Height of Stairs: 6 Ramp: Moderate Assistance - Patient 50 - 74% Curb: Minimal Assistance - Patient >75% Wheelchair Mobility Wheelchair Mobility: No  Trunk/Postural Assessment  Cervical Assessment Cervical Assessment: Within Functional Limits Thoracic Assessment Thoracic Assessment: Within Functional Limits Lumbar Assessment Lumbar Assessment: Within Functional Limits Postural Control Postural Control: Deficits on  evaluation Righting Reactions: Delayed  Balance Balance Balance Assessed: Yes Static Sitting Balance Static Sitting - Balance Support: No upper extremity supported Static Sitting - Level of Assistance: 5: Stand by assistance Dynamic Sitting Balance Dynamic Sitting - Balance Support: No upper extremity supported Dynamic Sitting - Level of Assistance: 5: Stand by assistance Static Standing Balance Static Standing - Balance Support: Bilateral upper extremity supported Static Standing - Level of Assistance: 4: Min assist Dynamic Standing Balance Dynamic Standing - Balance Support: Bilateral upper extremity supported Dynamic Standing - Level of Assistance: 4: Min assist Extremity Assessment  RLE Assessment Passive Range of Motion (PROM) Comments: WFL Active Range of Motion (AROM) Comments: WFL General Strength Comments: Grossly 5/5 LLE Assessment LLE Assessment: Exceptions to [facility] Passive Range of Motion (PROM) Comments: WFL Active Range of Motion (AROM) Comments: Limited by pain and weakness LLE Strength Left Hip Flexion: 2+/5 Left Knee Flexion: 3+/5 Left Knee Extension: 3-/5 Left Ankle Dorsiflexion: 3+/5 Left Ankle Plantar Flexion: 3+/5    Refer to Care Plan for Long Term Goals  Recommendations for other services:  None   Discharge Criteria: Patient will be discharged from PT if patient refuses treatment 3 consecutive times without medical reason, if treatment goals not met, if there is a change in medical status, if patient makes no progress towards goals or if patient is discharged from hospital.  The above assessment, treatment plan, treatment alternatives and goals were discussed and mutually agreed upon: by patient  Breck Coons 09/24/2019, 1:00 PM

## 2019-09-24 NOTE — Care Management (Signed)
[facility] Individual Statement of Services  Patient Name:  [name]  Date:  09/24/2019  Welcome to the [facility].  Our goal is to provide you with an individualized program based on your diagnosis and situation, designed to meet your specific needs.  With this comprehensive rehabilitation program, you will be expected to participate in at least 3 hours of rehabilitation therapies Monday-Friday, with modified therapy programming on the weekends.  Your rehabilitation program will include the following services:  Physical Therapy (PT), Occupational Therapy (OT), Speech Therapy (ST), 24 hour per day rehabilitation nursing, Therapeutic Recreation (TR), Neuropsychology, Care Coordinator, Rehabilitation Medicine, Nutrition Services and Pharmacy Services  Weekly team conferences will be held on Wednesdays to discuss your progress.  Your Inpatient Rehabilitation Care Coordinator will talk with you frequently to get your input and to update you on team discussions.  Team conferences with you and your family in attendance may also be held.  Expected length of stay: 10-14 days Overall anticipated outcome: Supervision level overall  Depending on your progress and recovery, your program may change. Your Inpatient Rehabilitation Care Coordinator will coordinate services and will keep you informed of any changes. Your Inpatient Rehabilitation Care Coordinator's name and contact numbers are listed  below.  The following services may also be recommended but are not provided by the [facility]:    [place] will be made to provide these services after discharge if needed.  Arrangements include referral to agencies that provide these services.  Your insurance has been verified to be:  BCBS PPO Your primary doctor is:  Dr. Matilde Haymaker  Pertinent information will be  shared with your doctor and your insurance company.  Inpatient Rehabilitation Care Coordinator:  Erlene Quan, [place] or ([phone]  Information discussed with and copy given to patient by: Margarito Liner, 09/24/2019, 1:28 PM

## 2019-09-24 NOTE — Progress Notes (Signed)
Initial Nutrition Assessment  DOCUMENTATION CODES:   Obesity unspecified  INTERVENTION:   - Nepro Shake po TID, each supplement provides 425 kcal and 19 grams protein  - Renal MVI daily  - Provided diet education  NUTRITION DIAGNOSIS:   Increased nutrient needs related to chronic illness as evidenced by estimated needs.  GOAL:   Patient will meet greater than or equal to 90% of their needs  MONITOR:   PO intake, Supplement acceptance, Labs, Weight trends, I & O's  REASON FOR ASSESSMENT:   Malnutrition Screening Tool    ASSESSMENT:   59 year old female with PMH of diet-controlled diabetes mellitus, HTN, and ESRD on HD. Presented 09/18/19 with acute onset of left-sided weakness, facial droop, and slurred speech. MRI showed a 1 cm acute infarct in the left cerebellum. Subcentimeter acute infarct in the deep white matter just posterior to the atrium of the right lateral ventricle. Admitted to CIR on 5/20.   Last HD on 09/22/19 with 1771 ml net UF. Post-HD weight was 102.6 kg.  EDW: 105.5 kg  Spoke with pt at bedside. Pt reports having a poor appetite since admission due to nausea. Pt reports nausea is improved at this time. Noted ~25% completed lunch meal tray. Pt reports that this is more than she has been eating over the last week but that she did eat better at breakfast. RD discussed the importance of adequate kcal and protein intake. Provided pt with a Nepro shake to try. RN aware. Pt liked Nepro shake. RD will order TID in addition to renal MVI.  Pt reports that PTA, she ate well and had a good appetite. Pt reports that she cooked and also ate at the [employer] cafeteria. Pt ate items like salad, baked chicken, etc.  Reviewed weight history in chart. Pt with a 24.7 kg weight loss since 02/03/19. This is a 19.5% weight loss which is significant for timeframe. Pt surprised to hear that she has lost weight.  Meal Completion: 75-80%  Medications reviewed and include: ferric citrate  TID with meals, Zofran po q 12 hours  Labs reviewed: phosphorus 5.7  NUTRITION - FOCUSED PHYSICAL EXAM:    Most Recent Value  Orbital Region  No depletion  Upper Arm Region  No depletion  Thoracic and Lumbar Region  No depletion  Buccal Region  No depletion  Temple Region  No depletion  Clavicle Bone Region  No depletion  Clavicle and Acromion Bone Region  Mild depletion  Scapular Bone Region  No depletion  Dorsal Hand  No depletion  Patellar Region  No depletion  Anterior Thigh Region  Mild depletion  Posterior Calf Region  No depletion  Edema (RD Assessment)  None  Hair  Reviewed  Eyes  Reviewed  Mouth  Reviewed  Skin  Reviewed  Nails  Reviewed       Diet Order:   Diet Order            Diet renal/carb modified with fluid restriction Diet-HS Snack? Nothing; Fluid restriction: 1200 mL Fluid; Room service appropriate? Yes; Fluid consistency: Thin  Diet effective now              EDUCATION NEEDS:   Education needs have been addressed  Skin:  Skin Assessment: Reviewed RN Assessment  Last BM:  09/22/19  Height:   Ht Readings from Last 1 Encounters:  09/23/19 5' 9.02" (1.753 m)    Weight:   Wt Readings from Last 1 Encounters:  09/23/19 101.9 kg    BMI:  Body mass index is 33.16 kg/m.  Estimated Nutritional Needs:   Kcal:  2100-2300  Protein:  110-130 grams  Fluid:  1000 ml + UOP    Gaynell Face, MS, RD, LDN Inpatient Clinical Dietitian Pager: [phone] Weekend/After Hours: [phone]

## 2019-09-24 NOTE — Evaluation (Addendum)
Speech Language Pathology Assessment and Plan  Patient Details  Name: [name] MRN: [id number] Date of Birth: 01-31-1961  SLP Diagnosis: Cognitive Impairments;Dysarthria  Rehab Potential: Excellent ELOS: 2-2.5 weeks    Today's Date: 09/24/2019 SLP Individual Time: 1100-1155 Time Calculation: 55 minutes     Problem List:  Patient Active Problem List   Diagnosis Date Noted  . 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[place])   . Leukocytosis   . Left-sided weakness   . Acute ischemic stroke ([place]) 09/19/2019  . Stroke ([place]) 09/18/2019  . Intractable nausea and vomiting 04/20/2019  . Atypical chest pain 04/02/2019  . Type 2 diabetes mellitus without complications ([place]) 24/40/1027  . Anemia in chronic kidney disease 02/19/2019  . ESRD (end stage renal disease) ([place]) 02/19/2019  . Secondary hyperparathyroidism of renal origin ([place]) 02/19/2019  . Coagulation defect, unspecified ([place]) 02/19/2019  . Venous (peripheral) insufficiency 08/06/2017  . Encounter for screening for respiratory tuberculosis 01/01/2016  . Hypertension   . 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 Diabetic gastroparesis ([place]) 08/09/2015  . Low back pain 06/09/2014  . Nausea and vomiting 01/18/2012  . Diarrhea, unspecified 01/18/2012  . Obstructive sleep apnea of adult 01/16/2012  . GERD (gastroesophageal reflux disease) 01/16/2012  . Osteoarthritis of both knees 01/16/2012  . Hyperlipidemia, unspecified 07/18/2011  . LEIOMYOMA, UTERUS 01/14/2007  . Morbid obesity ([place]) 07/03/2006  . Former smoker 07/03/2006  . Tension headache 07/03/2006   Past Medical History:  Past Medical History:  Diagnosis Date  . Accelerated hypertension   . Aortic atherosclerosis ([place])   . Arthritis of knee    bilateral  . Diabetes mellitus    type 2 - no meds  . Diabetic gastroparesis ([place])   . Diverticulosis   .  ESRD (end stage renal disease) ([place])   . Fibroid uterus   . GERD (gastroesophageal reflux disease)   . Hyperlipidemia   . Hypertension   . HYPERTENSION, BENIGN SYSTEMIC 07/03/2006        . Hypertensive emergency 02/05/2019  . Hypokalemia   . IDA (iron deficiency anemia)   . Nausea   . Renal disorder   . Umbilical hernia   . Wears dentures    Past Surgical History:  Past Surgical History:  Procedure Laterality Date  . AV FISTULA PLACEMENT Left 02/17/2019   Procedure: ARTERIOVENOUS (AV) FISTULA CREATION LEFT UPPER ARM;  Surgeon: Waynetta Sandy, MD;  Location: [facility];  Service: Vascular;  Laterality: Left;  . CATARACT EXTRACTION     right eye  . CESAREAN SECTION     x2  . CHOLECYSTECTOMY     laparoscopic  . FISTULA SUPERFICIALIZATION Left 04/06/2019   Procedure: FISTULA SUPERFICIALIZATION LEFT BRACHIOCEPHALIC;  Surgeon: Waynetta Sandy, MD;  Location: [facility];  Service: Vascular;  Laterality: Left;  Transposition left arm brachiocephalic fistula.  . IR FLUORO GUIDE CV LINE RIGHT  02/09/2019  . IR FLUORO GUIDE CV LINE RIGHT  03/05/2019  . IR US GUIDE VASC ACCESS RIGHT  02/09/2019  . MULTIPLE TOOTH EXTRACTIONS    . REDUCTION MAMMAPLASTY Bilateral     Assessment / Plan / Recommendation Clinical Impression Patient is a 59 y.o.right-handed femalewith history of diabetes mellitus, hypertension, end-stage renal disease with hemodialysis Monday Wednesday Friday.  Presented 09/18/2019 with acute onset of left-sided weakness facial droop and slurred speech. Cranial CT scan showed no acute findings. 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  CT angiogram head and neck no acute large vessel or medium vessel occlusion. Patient did not receive TPA. MRI showed 1 cm acute infarct in the left cerebellum. Sub centimeter acute infarct in the deep white matter just posterior to the atrium of the right lateral ventricle. Tolerating a regular diet. Therapy evaluations  completed and patient was admitted for a comprehensive rehab program 09/23/19.  Patient demonstrates mild cognitive impairments impacting sustained attention, functional problem solving, short-term recall and emergent awareness of errors. Patient's overall function is also impacted by delayed processing and fatigue. Patient was administered the Cognistat and demonstrates mild deficits in the areas of attention and reasoning and moderate deficits in memory. Patient was ~90% intelligible at the sentence level due a low vocal intensity requiring cues for increased vocal intensity. Patient would benefit from skilled SLP intervention to maximize her cognitive and speech functioning prior to discharge.    Skilled Therapeutic Interventions          Administered a cognitive-linguistic evaluation, please see above for details.   SLP Assessment  Patient will need skilled [place] Pathology Services during CIR admission    Recommendations  Oral Care Recommendations: Oral care BID Recommendations for Other Services: Neuropsych consult Patient destination: Home Follow up Recommendations: 24 hour supervision/assistance;Home Health SLP Equipment Recommended: None recommended by SLP    SLP Frequency 3 to 5 out of 7 days   SLP Duration  SLP Intensity  SLP Treatment/Interventions 2-2.5 weeks  Minumum of 1-2 x/day, 30 to 90 minutes  Cognitive remediation/compensation;Internal/external aids;Speech/Language facilitation;Therapeutic Activities;Environmental controls;Cueing hierarchy;Functional tasks;Patient/family education    Pain Pain Assessment Pain Scale: 0-10 Pain Score: 0-No pain  Prior Functioning Type of Home: Apartment  Lives With: Son Available Help at Discharge: Family;Available 24 hours/day Vocation: Full time employment  SLP Evaluation Cognition Overall Cognitive Status: Impaired/Different from baseline Arousal/Alertness: Lethargic Orientation Level: Oriented X4 Attention:  Focused;Sustained Focused Attention: Appears intact Focused Attention Impairment: Verbal complex Sustained Attention: Impaired Sustained Attention Impairment: Functional complex Memory: Impaired Memory Impairment: Storage deficit;Decreased recall of new information;Retrieval deficit Immediate Memory Recall: Sock;Blue;Bed Memory Recall Sock: Without Cue Memory Recall Blue: With Cue Memory Recall Bed: Without Cue Awareness: Impaired Awareness Impairment: Emergent impairment Problem Solving: Impaired Problem Solving Impairment: Verbal complex;Functional complex Executive Function: Sequencing Sequencing: Impaired Sequencing Impairment: Verbal complex;Functional complex Safety/Judgment: Impaired  Comprehension Auditory Comprehension Overall Auditory Comprehension: Appears within functional limits for tasks assessed Visual Recognition/Discrimination Discrimination: Within Function Limits Reading Comprehension Reading Status: Not tested Expression Expression Primary Mode of Expression: Verbal Verbal Expression Overall Verbal Expression: Appears within functional limits for tasks assessed Written Expression Dominant Hand: Right Written Expression: Not tested Oral Motor Oral Motor/Sensory Function Overall Oral Motor/Sensory Function: Mild impairment Facial ROM: Within Functional Limits Facial Symmetry: Within Functional Limits Facial Strength: Within Functional Limits Facial Sensation: Within Functional Limits Lingual ROM: Within Functional Limits Lingual Strength: Reduced;Suspected CN XII (hypoglossal) dysfunction Lingual Sensation: Within Functional Limits Velum: Within Functional Limits Mandible: Within Functional Limits Motor Speech Overall Motor Speech: Impaired Respiration: Within functional limits Phonation: Low vocal intensity Resonance: Within functional limits Articulation: Within functional limitis Intelligibility: Intelligibility reduced Word: 75-100%  accurate Phrase: 75-100% accurate Sentence: 75-100% accurate Conversation: 75-100% accurate Motor Planning: Witnin functional limits Effective Techniques: Increased vocal intensity   PMSV Assessment  PMSV Trial Intelligibility: Intelligibility reduced Word: 75-100% accurate Phrase: 75-100% accurate Sentence: 75-100% accurate Conversation: 75-100% accurate  Short Term Goals: Week 1: SLP Short Term Goal 1 (Week 1): Patient will utilize an increased vocal intensity at the sentence  level to achieve 100% intelligibility with supervision verbal cues. SLP Short Term Goal 2 (Week 1): Patient will demonstrate functional problem solving for mildly complex tasks with Min verbal cues. SLP Short Term Goal 3 (Week 1): Patient will utilize memory compensatory strategies to recall new, daily information with Min verbal cues. SLP Short Term Goal 4 (Week 1): Patient will demonstrate emergent awrenes of errors during functional tasks with Min verbal cues. SLP Short Term Goal 5 (Week 1): Patient will demonstrate selective attention to functional tasks in a mildly distracting enviornment for ~30 minutes with Min verbal cues for redirection.  Refer to Care Plan for Long Term Goals  Recommendations for other services: Neuropsych  Discharge Criteria: Patient will be discharged from SLP if patient refuses treatment 3 consecutive times without medical reason, if treatment goals not met, if there is a change in medical status, if patient makes no progress towards goals or if patient is discharged from hospital.  The above assessment, treatment plan, treatment alternatives and goals were discussed and mutually agreed upon: by patient  Kayston Jodoin 09/24/2019, 3:44 PM

## 2019-09-25 LAB — CULTURE, BLOOD (ROUTINE X 2)
Culture: NO GROWTH
Culture: NO GROWTH
Special Requests: ADEQUATE

## 2019-09-25 NOTE — Progress Notes (Signed)
Late entry Patient arrived on department transported by  Renal staff via bed and VSS ,denies pain at this time, generalized discomfort soreness to LE's  With movement and touch, refer to assessment data recorders for addition info

## 2019-09-25 NOTE — Progress Notes (Signed)
Late entry Received transfer report from Renal RN ( Obag, H.) with VS, reported no acute distress or discomfort during and after dialysis.

## 2019-09-25 NOTE — Progress Notes (Signed)
Unsuccessful attempt to I/O cath/ x 1 patient unable void  Bladder scan 3 ml, po fluids encouraged Still need speciment for curine culture, will inform on coming nurse.

## 2019-09-25 NOTE — Progress Notes (Signed)
[facility] PHYSICAL MEDICINE & REHABILITATION PROGRESS NOTE   Subjective/Complaints:  Pt reports feeling good- denies Sx's of UTI/chills sweats, and denies pain- also no SOB.   Nursing unable to get in/out caths last night for urine Cx.   Review of systems _  Pt denies SOB, abd pain, CP, N/V/C/D, and vision changes  Objective:   No results found. Recent Labs    09/23/19 0244  WBC 17.2*  HGB 11.0*  HCT 39.0  PLT 551*   Recent Labs    09/23/19 0244  NA 135  K 4.7  CL 93*  CO2 26  GLUCOSE 137*  BUN 33*  CREATININE 7.33*  CALCIUM 9.8    Intake/Output Summary (Last 24 hours) at 09/25/2019 1523 Last data filed at 09/25/2019 1511 Gross per 24 hour  Intake 318 ml  Output 1508 ml  Net -1190 ml     Physical Exam: Vital Signs Blood pressure 111/62, pulse 90, temperature 98 F (36.7 C), temperature source Oral, resp. rate 17, height 5' 9.02" (1.753 m), weight 101.1 kg, SpO2 100 %.   General: sitting up in bed; appropriate, watching TV, NAD Mood and affect are appropriate Heart: RRR; no MR/G, no JVD Lungs:  Pt denies SOB, abd pain, CP, N/V/C/D, and vision changes Abdomen: Soft, NT, ND, (+)BS  Extremities: No clubbing, cyanosis, or edema Skin: No evidence of breakdown, no evidence of rash Neurologic: Cranial nerves II through XII intact, motor strength is 5/5 in Right 3-/5 Left  deltoid, bicep, tricep, grip, hip flexor, knee extensors, ankle dorsiflexor and plantar flexor Sensory exam normal sensation to light touch and proprioception in bilateral upper and lower extremities Cerebellar exam normal finger to nose to finger as well as heel to shin in right  upper and lower extremities Limited by weakness on Left side  Musculoskeletal: No pain with upper limb or lower limb active assistive range of motion   Assessment/Plan: 1. 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 also had a left cerebellar stroke but no  significant limb ataxia which require 3+ hours per day of interdisciplinary therapy in a comprehensive inpatient rehab setting.  Physiatrist is providing close team supervision and 24 hour management of active medical problems listed below.  Physiatrist and rehab team continue to assess barriers to discharge/monitor patient progress toward functional and medical goals  Care Tool:  Bathing    Body parts bathed by patient: Left arm, Chest, Abdomen, Front perineal area, Right upper leg, Left upper leg, Face, Right lower leg   Body parts bathed by helper: Left lower leg, Right arm, Buttocks     Bathing assist Assist Level: Minimal Assistance - Patient > 75%     Upper Body Dressing/Undressing Upper body dressing   What is the patient wearing?: Pull over shirt    Upper body assist Assist Level: Moderate Assistance - Patient 50 - 74%    Lower Body Dressing/Undressing Lower body dressing      What is the patient wearing?: Pants     Lower body assist Assist for lower body dressing: Moderate Assistance - Patient 50 - 74%     Toileting Toileting    Toileting assist       Transfers Chair/bed transfer  Transfers assist     Chair/bed transfer assist level: Minimal Assistance - Patient > 75%     Locomotion Ambulation   Ambulation assist      Assist level: Minimal Assistance - Patient > 75% Assistive device: Walker-rolling Max distance: 68'  Walk 10 feet activity   Assist     Assist level: Minimal Assistance - Patient > 75% Assistive device: Walker-rolling   Walk 50 feet activity   Assist Walk 50 feet with 2 turns activity did not occur: Safety/medical concerns         Walk 150 feet activity   Assist Walk 150 feet activity did not occur: Safety/medical concerns         Walk 10 feet on uneven surface  activity   Assist     Assist level: Moderate Assistance - Patient - 50 - 74% Assistive device: Hand held assist, Other (comment)(R hand  rail)   Wheelchair     Assist Will patient use wheelchair at discharge?: No             Wheelchair 50 feet with 2 turns activity    Assist            Wheelchair 150 feet activity     Assist          Blood pressure 111/62, pulse 90, temperature 98 F (36.7 C), temperature source Oral, resp. rate 17, height 5' 9.02" (1.753 m), weight 101.1 kg, SpO2 100 %.  Medical Problem List and Plan: 1.Left-sided weakness and dysarthriasecondary to 2 small infarcts left cerebellar deep nucleusand posterior to the atrium of the right lateral ventricle most likely separate small vessel disease. Plan 30-day cardiac event monitor as outpatient -patient may shower -ELOS/Goals: 2-2.5 weeks; goals supervision to mod I 2. Antithrombotics: -DVT/anticoagulation:Subcutaneous heparin -antiplatelet therapy: Aspirin and Plavix x3 weeks then Plavix alone 3. Pain Management:Tylenol as needed 4. Mood:Provide emotional support -antipsychotic agents: N/A 5. Neuropsych: This patientiscapable of making decisions on herown behalf. 6. Skin/Wound Care:Routine skin checks 7. Fluids/Electrolytes/Nutrition:Routine in and outs with follow-up chemistries 8. End-stage renal disease. Continue hemodialysis as directed 9. Hyperlipidemia. Lipitor 10. Diet controlled diabetes mellitus. Hemoglobin A1c 6.7. Blood sugar checks currently discontinued  11. Permissive hypertension/tachycardia. Lopressor initiated 50mg  twice daily 09/22/2019. Monitor with increased mobility and titrate as required.   Vitals:   09/25/19 0440 09/25/19 1514  BP: (!) 105/57 111/62  Pulse: (!) 107 90  Resp: 19 17  Temp: 98.5 F (36.9 C) 98 F (36.7 C)  SpO2: 98% 100%  5/22- BP soft- con't regimen, but monitor closely.   12/ UTI?, leukocytosis  5/22- Last WBC was 17.2- will recheck in AM- no urine Cx was able to be gotten by nursing- will con't to try -  on no ABX- will monitor  LOS: 2 days A FACE TO FACE EVALUATION WAS PERFORMED  Megan Lovorn 09/25/2019, 3:23 PM

## 2019-09-26 ENCOUNTER — Inpatient Hospital Stay (HOSPITAL_COMMUNITY): Payer: BC Managed Care – PPO | Admitting: Speech Pathology

## 2019-09-26 ENCOUNTER — Inpatient Hospital Stay (HOSPITAL_COMMUNITY): Payer: BC Managed Care – PPO | Admitting: Occupational Therapy

## 2019-09-26 ENCOUNTER — Inpatient Hospital Stay (HOSPITAL_COMMUNITY): Payer: BC Managed Care – PPO | Admitting: Physical Therapy

## 2019-09-26 LAB — CBC WITH DIFFERENTIAL/PLATELET
Abs Immature Granulocytes: 0.06 10*3/uL (ref 0.00–0.07)
Basophils Absolute: 0 10*3/uL (ref 0.0–0.1)
Basophils Relative: 0 %
Eosinophils Absolute: 0.1 10*3/uL (ref 0.0–0.5)
Eosinophils Relative: 1 %
HCT: 35.3 % — ABNORMAL LOW (ref 36.0–46.0)
Hemoglobin: 10.2 g/dL — ABNORMAL LOW (ref 12.0–15.0)
Immature Granulocytes: 1 %
Lymphocytes Relative: 21 %
Lymphs Abs: 2.6 10*3/uL (ref 0.7–4.0)
MCH: 23.1 pg — ABNORMAL LOW (ref 26.0–34.0)
MCHC: 28.9 g/dL — ABNORMAL LOW (ref 30.0–36.0)
MCV: 80 fL (ref 80.0–100.0)
Monocytes Absolute: 1 10*3/uL (ref 0.1–1.0)
Monocytes Relative: 8 %
Neutro Abs: 8.4 10*3/uL — ABNORMAL HIGH (ref 1.7–7.7)
Neutrophils Relative %: 69 %
Platelets: 466 10*3/uL — ABNORMAL HIGH (ref 150–400)
RBC: 4.41 MIL/uL (ref 3.87–5.11)
RDW: 19.7 % — ABNORMAL HIGH (ref 11.5–15.5)
WBC: 12.2 10*3/uL — ABNORMAL HIGH (ref 4.0–10.5)
nRBC: 0 % (ref 0.0–0.2)

## 2019-09-26 LAB — RENAL FUNCTION PANEL
Albumin: 3.4 g/dL — ABNORMAL LOW (ref 3.5–5.0)
Anion gap: 14 (ref 5–15)
BUN: 32 mg/dL — ABNORMAL HIGH (ref 6–20)
CO2: 27 mmol/L (ref 22–32)
Calcium: 9.8 mg/dL (ref 8.9–10.3)
Chloride: 92 mmol/L — ABNORMAL LOW (ref 98–111)
Creatinine, Ser: 7.96 mg/dL — ABNORMAL HIGH (ref 0.44–1.00)
GFR calc Af Amer: 6 mL/min — ABNORMAL LOW (ref >60)
GFR calc non Af Amer: 5 mL/min — ABNORMAL LOW (ref >60)
Glucose, Bld: 117 mg/dL — ABNORMAL HIGH (ref 70–99)
Phosphorus: 5.1 mg/dL — ABNORMAL HIGH (ref 2.5–4.6)
Potassium: 4.2 mmol/L (ref 3.5–5.1)
Sodium: 133 mmol/L — ABNORMAL LOW (ref 135–145)

## 2019-09-26 NOTE — Progress Notes (Signed)
Physical Therapy Session Note  Patient Details  Name: [name] MRN: [id number] Date of Birth: [birth year]  Today's Date: 09/26/2019 PT Individual Time: 4259-5638 PT Individual Time Calculation (min): 72 min   Short Term Goals: Week 1:  PT Short Term Goal 1 (Week 1): Pt will perform bed mobility with CGA. PT Short Term Goal 2 (Week 1): Pt will perform bed to chair transfer with CGA. PT Short Term Goal 3 (Week 1): Pt will ambulate 72' with CGA. PT Short Term Goal 4 (Week 1): Pt will perform 10 steps with CGA and BHRs.  Skilled Therapeutic Interventions/Progress Updates: Pt presents supine in bed, and agreeable to therapy.  Pt performed sup to sit at EOB w/ CGA and increased time, stating "Oh you want me to do it!"  Pt transferred sit to stand and SPT into w/c w/ min A and verbal cues for hand placement.  Pt wheeled to gym for time and energy conservation.  Pt performed multiple sit to stand transfers at Therapy table to performConnect $ game using left UE to place chips.  Pt initially required min A for raising left arm, but then only supervision.  Pt only able to stand approx. 1-2' per trial, requiring encouragement to increase time.  Pt states left LE feels "heavy".  Pt requires verbal cues for safety and reaching back for arm rest, to avoid flopping.  Pt required seated rest breaks 2/2 fatigue.  Pt performed LE ther ex including calf raises, LAQ (w/ unweighting left LE before initiating), hip flexion (initial AAROM LLE) and isometric add 2 x 10.  Pt amb multiple trials w/ RW and min A up to 20' including turn to sit on chair.  Pt requires verbal cueing for posture and walker management as tends to push too far.  Pt has decreased foot clearance LLE.  Pt returned to room and remained in w/c at window w/ chair alarm on and all needs in reach.  Handed off to NT.     Therapy Documentation Precautions:  Precautions Precautions: Fall Precaution Comments: L hemiparesis/watch BP Restrictions Weight  Bearing Restrictions: No General:   Vital Signs: Therapy Vitals Temp: 97.8 F (36.6 C) Temp Source: Oral Pulse Rate: 89 Resp: 20 BP: 105/63 Patient Position (if appropriate): Sitting Oxygen Therapy SpO2: 100 % O2 Device: Room Air Pain:c/o soreness left LE and UE. Pain Assessment Pain Score: 0-No pain Mobility:      Therapy/Group: Individual Therapy  Ladoris Gene 09/26/2019, 2:24 PM

## 2019-09-26 NOTE — Progress Notes (Signed)
[name] Progress Note   Subjective: Seen in room. SLP at bedside. No new complaints, some nausea when eating. Denies CP, SOB.   Objective Vitals:   09/25/19 0440 09/25/19 1514 09/25/19 1919 09/26/19 0546  BP: (!) 105/57 111/62 (!) 152/75 (!) 110/59  Pulse: (!) 107 90 98 89  Resp: 19 17 19 19   Temp: 98.5 F (36.9 C) 98 F (36.7 C) 97.9 F (36.6 C) 98.3 F (36.8 C)  TempSrc: Oral Oral Oral Oral  SpO2: 98% 100% 99% 99%  Weight:      Height:       Physical Exam General:Chronically ill appearing,NAD Heart:RRR, no murmur  Lungs:CTAB Abdomen:soft, non-tender Extremities:no LE edema Dialysis Access:LUE AVF + bruit  Additional Objective Labs: Basic Metabolic Panel: Recent Labs  Lab 09/22/19 0443 09/23/19 0244 09/26/19 0231  NA 140 135 133*  K 4.8 4.7 4.2  CL 97* 93* 92*  CO2 27 26 27   GLUCOSE 128* 137* 117*  BUN 43* 33* 32*  CREATININE 10.24* 7.33* 7.96*  CALCIUM 9.7 9.8 9.8  PHOS 5.7* 5.7* 5.1*   CBC: Recent Labs  Lab 09/20/19 1324 09/20/19 1324 09/21/19 0240 09/21/19 0240 09/22/19 0443 09/23/19 0244 09/26/19 0231  WBC 14.9*   < > 17.4*  16.9*   < > 16.4* 17.2* 12.2*  NEUTROABS  --   --  14.9*  --   --   --  8.4*  HGB 12.2   < > 11.4*  11.1*   < > 11.1* 11.0* 10.2*  HCT 42.8   < > 40.0  39.7   < > 38.1 39.0 35.3*  MCV 80.9  --  81.6  81.7  --  79.9* 80.7 80.0  PLT 582*   < > 632*  636*   < > 581* 551* 466*   < > = values in this interval not displayed.   Blood Culture    Component Value Date/Time   SDES URINE, CATHETERIZED 09/24/2019 1514   SPECREQUEST NONE 09/24/2019 1514   CULT  09/24/2019 1514    CULTURE REINCUBATED FOR BETTER GROWTH Performed at [facility] [address], [place], [place] [address]    REPTSTATUS PENDING 09/24/2019 1514   Medications:  . aspirin EC  81 mg Oral Daily  . atorvastatin  40 mg Oral Daily  . clopidogrel  75 mg Oral Daily  . feeding supplement (NEPRO CARB STEADY)  237 mL  Oral TID BM  . ferric citrate  420 mg Oral TID WC  . heparin  5,000 Units Subcutaneous Q8H  . metoprolol tartrate  50 mg Oral BID  . multivitamin  1 tablet Oral QHS  . ondansetron  4 mg Oral Q12H    Dialysis Orders: [facility] 4h 400/800 105.5kg 3K/2.25 bath UFP #3 LUE AVF Hep 2000 - Hectoral 91mcg Iv q HD - Mircera 268mcg IV q 2 weeks (last 5/10) - Venofer 100 x 10 ordered --> for Hgb 8.5, tsat 7% on 5/10.   Assessment/Plan: 1. Acute L cerebellar + subcentimeter deep white matter R QXI:[id number] L sided weakness and facial droop, improving. Echo normal. CTA without carotid or posterior circulation stenoses. Prior CVA on imaging that she was unaware of. Neuro followed, plan is for 30d event monitor to r/o A-fib. 2. Disability: Now transitioned to CIR  3. ESRD:Continue HD per MWF schedule- next 5/24  4. Hypertension/volume:Permissive HTN w/ acute CVA.BP now  controlled/low sided. Below EDW. No edema on exam.  HR improved now that back on metoprolol. 5.  Anemia(ESRD + IDA):Hgb 10.2. Next ESA due 5/24 -monitor for now. On high dose ESA as outpatient. 6. 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 7. T2DM 8. Hyperlipidemia: Prev on statin - off for unknown time, resumed here. 9. Leukocytosis:WBC rising - unclear etiology. UCx pending per primary. 10. Gastroparesis: Chronic  issue.  Lynnda Child PA-C  Kidney Brown 09/26/2019,11:09 AM

## 2019-09-26 NOTE — Progress Notes (Signed)
Occupational Therapy Session Note  Patient Details  Name: [name] MRN: [id number] Date of Birth: 14-Mar-1961  Today's Date: 09/26/2019 OT Individual Time: 1791-5056 OT Individual Time Calculation (min): 56 min   Short Term Goals: Week 1:  OT Short Term Goal 2 (Week 1): Patient will don UB clothing with supervision A. OT Short Term Goal 3 (Week 1): Patient will don LB clothing with Min A in sitting/standing with LRAD. OT Short Term Goal 4 (Week 1): Patient will complete 3/3 parts of toileting with Min A.  Skilled Therapeutic Interventions/Progress Updates:    Pt greeted in bed, requesting to shower. Supervision for supine<sit and then pt ambulated without AD and Min A to TTB. Note that she relied heavily on the furniture and doorways for standing balance. Pt then bathed at sit<stand level after OT covered her IV. She utilized figure 4 position for washing both feet. Increased pain when attempting to use the Lt hand to wash the Rt side so OT assisted her without HOH. CGA for sit<stand with pt able to maintain grip on the grab bar with the Lt hand during power up. After standing pt had an episode of n/v, emesis brown with chunks. RN made aware, pt premedicated for nausea. Pt ambulated back to bed using RW with CGA, vcs for standing inside of device during ambulation and proper positioning of device before sitting down. She then dressed at sit<stand level using RW for standing support. Increased assistance for dressing due to not feeling well still. She was able to grip fabric with the Lt hand to assist with threading LEs into pants today. She also incorporated Lt hand as a gross stabilizer during grooming tasks with vcs. At end of session pt returned to bed with Mod A for elevating LEs. OT provided her with an antinausea aromatherapy blend. Pt appreciative. Left pt to rest before her next therapy. All needs within reach and bed alarm set. Tx focus placed on Lt NMR, ADL retraining, functional  transfers, dynamic balance.   Therapy Documentation Precautions:  Precautions Precautions: Fall Precaution Comments: L hemiparesis/watch BP Restrictions Weight Bearing Restrictions: No  Vital Signs: Therapy Vitals Temp: 97.8 F (36.6 C) Temp Source: Oral Pulse Rate: 89 Resp: 20 BP: 105/63 Patient Position (if appropriate): Sitting Oxygen Therapy SpO2: 100 % O2 Device: Room Air Pain: pt denied having pain during session   ADL: ADL Upper Body Bathing: Minimal assistance Where Assessed-Upper Body Bathing: Shower Lower Body Bathing: Moderate assistance Where Assessed-Lower Body Bathing: Shower Upper Body Dressing: Moderate assistance Where Assessed-Upper Body Dressing: Edge of bed Lower Body Dressing: Moderate assistance Where Assessed-Lower Body Dressing: Edge of bed Social research officer, government: Moderate cueing, Minimal assistance Social research officer, government Method: Stand pivot Youth worker: Transfer tub bench     Therapy/Group: Individual Therapy  Layanna Charo A Keidy Thurgood 09/26/2019, 4:17 PM

## 2019-09-26 NOTE — Progress Notes (Signed)
[place] PHYSICAL MEDICINE & REHABILITATION PROGRESS NOTE   Subjective/Complaints:  Pt reports doing "great' - in shower for the first time in "awhile" and excited.     Review of systems _   Pt denies SOB, abd pain, CP, N/V/C/D, and vision changes   Objective:   No results found. Recent Labs    09/26/19 0231  WBC 12.2*  HGB 10.2*  HCT 35.3*  PLT 466*   Recent Labs    09/26/19 0231  NA 133*  K 4.2  CL 92*  CO2 27  GLUCOSE 117*  BUN 32*  CREATININE 7.96*  CALCIUM 9.8    Intake/Output Summary (Last 24 hours) at 09/26/2019 1345 Last data filed at 09/26/2019 2229 Gross per 24 hour  Intake 480 ml  Output 8 ml  Net 472 ml     Physical Exam: Vital Signs Blood pressure (!) 110/59, pulse 89, temperature 98.3 F (36.8 C), temperature source Oral, resp. rate 19, height 5' 9.02" (1.753 m), weight 101.1 kg, SpO2 99 %.   General: sitting in shower, OT at side, NAD Mood and affect are appropriate Heart: no JVD Lungs: no resp distress; no accessory muscle use Abdomen: nodistended Extremities: No clubbing, cyanosis, or edema Skin: No evidence of breakdown, no evidence of rash Neurologic: Cranial nerves II through XII intact, motor strength is 5/5 in Right 3-/5 Left  deltoid, bicep, tricep, grip, hip flexor, knee extensors, ankle dorsiflexor and plantar flexor Sensory exam normal sensation to light touch and proprioception in bilateral upper and lower extremities Cerebellar exam normal finger to nose to finger as well as heel to shin in right  upper and lower extremities Limited by weakness on Left side  Musculoskeletal: No pain with upper limb or lower limb active assistive range of motion   Assessment/Plan: 1. 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  Physiatrist is providing close team supervision and 24 hour management of active medical problems listed below.  Physiatrist and rehab team continue to assess barriers to discharge/monitor patient progress toward functional and medical goals  Care Tool:  Bathing    Body parts bathed by patient: Left arm, Chest, Abdomen, Front perineal area, Right upper leg, Left upper leg, Face, Right lower leg, Buttocks, Left lower leg   Body parts bathed by helper: Right arm     Bathing assist Assist Level: Minimal Assistance - Patient > 75%     Upper Body Dressing/Undressing Upper body dressing   What is the patient wearing?: Pull over shirt    Upper body assist Assist Level: Moderate Assistance - Patient 50 - 74%    Lower Body Dressing/Undressing Lower body dressing      What is the patient wearing?: Pants     Lower body assist Assist for lower body dressing: Minimal Assistance - Patient > 75%     Toileting Toileting Toileting Activity did not occur (Clothing management and hygiene only): N/A (no void or bm)  Toileting assist       Transfers Chair/bed transfer  Transfers assist     Chair/bed transfer assist level: Minimal Assistance - Patient > 75%     Locomotion Ambulation   Ambulation assist      Assist level: Minimal Assistance - Patient > 75% Assistive device: Walker-rolling Max distance: 20'   Walk 10 feet activity   Assist  Assist level: Minimal Assistance - Patient > 75% Assistive device: Walker-rolling   Walk 50 feet activity   Assist Walk 50 feet with 2 turns activity did not occur: Safety/medical concerns         Walk 150 feet activity   Assist Walk 150 feet activity did not occur: Safety/medical concerns         Walk 10 feet on uneven surface  activity   Assist     Assist level: Moderate Assistance - Patient - 50 - 74% Assistive device: Hand held assist, Other (comment)(R hand rail)    Wheelchair     Assist Will patient use wheelchair at discharge?: No             Wheelchair 50 feet with 2 turns activity    Assist            Wheelchair 150 feet activity     Assist          Blood pressure (!) 110/59, pulse 89, temperature 98.3 F (36.8 C), temperature source Oral, resp. rate 19, height 5' 9.02" (1.753 m), weight 101.1 kg, SpO2 99 %.  Medical Problem List and Plan: 1.Left-sided weakness and dysarthriasecondary to 2 small infarcts left cerebellar deep nucleusand posterior to the atrium of the right lateral ventricle most likely separate small vessel disease. Plan 30-day cardiac event monitor as outpatient -patient may shower -ELOS/Goals: 2-2.5 weeks; goals supervision to mod I 2. Antithrombotics: -DVT/anticoagulation:Subcutaneous heparin -antiplatelet therapy: Aspirin and Plavix x3 weeks then Plavix alone 3. Pain Management:Tylenol as needed 4. Mood:Provide emotional support -antipsychotic agents: N/A 5. Neuropsych: This patientiscapable of making decisions on herown behalf. 6. Skin/Wound Care:Routine skin checks 7. Fluids/Electrolytes/Nutrition:Routine in and outs with follow-up chemistries 8. End-stage renal disease. Continue hemodialysis as directed 9. Hyperlipidemia. Lipitor 10. Diet controlled diabetes mellitus. Hemoglobin A1c 6.7. Blood sugar checks currently discontinued  11. Permissive hypertension/tachycardia. Lopressor initiated 50mg  twice daily 09/22/2019. Monitor with increased mobility and titrate as required.   Vitals:   09/25/19 1919 09/26/19 0546  BP: (!) 152/75 (!) 110/59  Pulse: 98 89  Resp: 19 19  Temp: 97.9 F (36.6 C) 98.3 F (36.8 C)  SpO2: 99% 99%  5/23- BP variable from 350-093G systolic - will not change meds so far   12/ UTI?, leukocytosis  5/22- Last WBC was 17.2- will recheck in AM- no urine Cx was able to be gotten by nursing-  will con't to try - on no ABX- will monitor  5/23- Nursing got Cx- is staph capitus >100k- WBC is down to 12.2 from >17k- afebrile; pt feels good- suggest treating when sensitivities come back- are pending.    LOS: 3 days A FACE TO FACE EVALUATION WAS PERFORMED  Oveta Idris 09/26/2019, 1:45 PM

## 2019-09-26 NOTE — Progress Notes (Signed)
Speech Language Pathology Daily Session Note  Patient Details  Name: [name] MRN: [id number] Date of Birth: 1961/02/26  Today's Date: 09/26/2019 SLP Individual Time: 0211-1552 SLP Individual Time Calculation (min): 59 min  Short Term Goals: Week 1: SLP Short Term Goal 1 (Week 1): Patient will utilize an increased vocal intensity at the sentence level to achieve 100% intelligibility with supervision verbal cues. SLP Short Term Goal 2 (Week 1): Patient will demonstrate functional problem solving for mildly complex tasks with Min verbal cues. SLP Short Term Goal 3 (Week 1): Patient will utilize memory compensatory strategies to recall new, daily information with Min verbal cues. SLP Short Term Goal 4 (Week 1): Patient will demonstrate emergent awrenes of errors during functional tasks with Min verbal cues. SLP Short Term Goal 5 (Week 1): Patient will demonstrate selective attention to functional tasks in a mildly distracting enviornment for ~30 minutes with Min verbal cues for redirection.  Skilled Therapeutic Interventions: Pt was seen for skilled ST targeting cognitive goals. SLP facilitated session with basic money and medication management tasks from the ALFA. Pt performed functional money calculations (totaling, making change, displaying set amounts) with overall Min A verbal and visual for working memory (written aid was helpful); she was 100% accurate. She also completed basic medication management tasks with only extra time and Supervision A verbal cues for problem solving. Pt reported she only took a daily aspirin prior to admission and required Max A verbal cues to recall names and functions of current medications. She verbally identified 2 compensatory memory strategies (list making and calendar) to practice utilizing in future ST sessions. Pt left laying in bed with alarm set and needs within reach. Continue per current plan of care.        Pain Pain Assessment Pain Score: 0-No  pain  Therapy/Group: Individual Therapy  Arbutus Leas 09/26/2019, 7:22 AM

## 2019-09-27 ENCOUNTER — Inpatient Hospital Stay (HOSPITAL_COMMUNITY): Payer: BC Managed Care – PPO | Admitting: Speech Pathology

## 2019-09-27 ENCOUNTER — Inpatient Hospital Stay (HOSPITAL_COMMUNITY): Payer: BC Managed Care – PPO | Admitting: Occupational Therapy

## 2019-09-27 ENCOUNTER — Inpatient Hospital Stay (HOSPITAL_COMMUNITY): Payer: BC Managed Care – PPO

## 2019-09-27 LAB — RENAL FUNCTION PANEL
Albumin: 3.5 g/dL (ref 3.5–5.0)
Anion gap: 17 — ABNORMAL HIGH (ref 5–15)
BUN: 56 mg/dL — ABNORMAL HIGH (ref 6–20)
CO2: 23 mmol/L (ref 22–32)
Calcium: 9.4 mg/dL (ref 8.9–10.3)
Chloride: 87 mmol/L — ABNORMAL LOW (ref 98–111)
Creatinine, Ser: 11.17 mg/dL — ABNORMAL HIGH (ref 0.44–1.00)
GFR calc Af Amer: 4 mL/min — ABNORMAL LOW (ref >60)
GFR calc non Af Amer: 3 mL/min — ABNORMAL LOW (ref >60)
Glucose, Bld: 203 mg/dL — ABNORMAL HIGH (ref 70–99)
Phosphorus: 4 mg/dL (ref 2.5–4.6)
Potassium: 3.6 mmol/L (ref 3.5–5.1)
Sodium: 127 mmol/L — ABNORMAL LOW (ref 135–145)

## 2019-09-27 LAB — URINE CULTURE: Culture: >=100000 — AB

## 2019-09-27 LAB — CBC
HCT: 33.4 % — ABNORMAL LOW (ref 36.0–46.0)
Hemoglobin: 9.9 g/dL — ABNORMAL LOW (ref 12.0–15.0)
MCH: 23.6 pg — ABNORMAL LOW (ref 26.0–34.0)
MCHC: 29.6 g/dL — ABNORMAL LOW (ref 30.0–36.0)
MCV: 79.7 fL — ABNORMAL LOW (ref 80.0–100.0)
Platelets: 444 10*3/uL — ABNORMAL HIGH (ref 150–400)
RBC: 4.19 MIL/uL (ref 3.87–5.11)
RDW: 19.6 % — ABNORMAL HIGH (ref 11.5–15.5)
WBC: 11.3 10*3/uL — ABNORMAL HIGH (ref 4.0–10.5)
nRBC: 0 % (ref 0.0–0.2)

## 2019-09-27 MED ORDER — LIDOCAINE-PRILOCAINE 2.5-2.5 % EX CREA: 1.0000 "application " | TOPICAL_CREAM | CUTANEOUS | Status: DC | PRN

## 2019-09-27 MED ORDER — LIDOCAINE HCL (PF) 1 % IJ SOLN: 5.0000 mL | INTRAMUSCULAR | Status: DC | PRN

## 2019-09-27 MED ORDER — HEPARIN SODIUM (PORCINE) 1000 UNIT/ML DIALYSIS: 1000.0000 [IU] | INTRAMUSCULAR | Status: DC | PRN

## 2019-09-27 MED ORDER — HEPARIN SODIUM (PORCINE) 1000 UNIT/ML IJ SOLN
INTRAMUSCULAR | Status: AC
  Filled 2019-09-27: qty 2

## 2019-09-27 MED ORDER — WHITE PETROLATUM EX OINT: TOPICAL_OINTMENT | CUTANEOUS | Status: DC | PRN

## 2019-09-27 MED ORDER — HEPARIN SODIUM (PORCINE) 1000 UNIT/ML DIALYSIS
20.0000 [IU]/kg | INTRAMUSCULAR | Status: DC | PRN
  Administered 2019-09-27: 2000 [IU] via INTRAVENOUS_CENTRAL

## 2019-09-27 MED ORDER — SODIUM CHLORIDE 0.9 % IV SOLN: 100.0000 mL | INTRAVENOUS | Status: DC | PRN

## 2019-09-27 MED ORDER — ALTEPLASE 2 MG IJ SOLR: 2.0000 mg | Freq: Once | INTRAMUSCULAR | Status: DC | PRN

## 2019-09-27 MED ORDER — AMOXICILLIN 250 MG PO CAPS
250.0000 mg | ORAL_CAPSULE | Freq: Every day | ORAL | Status: AC
  Administered 2019-09-27 – 2019-10-03 (×7): 250 mg via ORAL
  Filled 2019-09-27 (×7): qty 1

## 2019-09-27 MED ORDER — WHITE PETROLATUM EX OINT
TOPICAL_OINTMENT | CUTANEOUS | Status: AC
  Administered 2019-09-27: 0.2
  Filled 2019-09-27: qty 28.35

## 2019-09-27 MED ORDER — PENTAFLUOROPROP-TETRAFLUOROETH EX AERO: 1.0000 "application " | INHALATION_SPRAY | CUTANEOUS | Status: DC | PRN

## 2019-09-27 NOTE — Progress Notes (Signed)
Physical Therapy Session Note  Patient Details  Name: [name] MRN: [id number] Date of Birth: July 11, 1960  Today's Date: 09/27/2019 PT Individual Time: 1001-1058 PT Individual Time Calculation (min): 57 min    Short Term Goals: Week 1:  PT Short Term Goal 1 (Week 1): Pt will perform bed mobility with CGA. PT Short Term Goal 2 (Week 1): Pt will perform bed to chair transfer with CGA. PT Short Term Goal 3 (Week 1): Pt will ambulate 2' with CGA. PT Short Term Goal 4 (Week 1): Pt will perform 10 steps with CGA and BHRs.  Skilled Therapeutic Interventions/Progress Updates:    Pt supine in bed upon PT arrival, agreeable to therapy tx and denies pain. Pt transferred to sitting with CGA and use of bedrails. Stand pivot to the w/c this session with RW and min assist, cues for attention to L and for techniques. Pt declines dizziness this session but does report fatigue with activities, saying "I just have to sit down" and reports she just gets tired with standing activities. Pt transported to the gym in w/c. Pt ambulated x 15 ft to the mat with RW and min assist, lack of foot clearance with L LE. Pt worked on L LE foot clearance and placement this session to perform x 8, x6 and x 5 toe taps on aerobic step and then worked on clearance/strength to perform standing L LE hamstring curls with RW for UE support x 4 and x 5, pt reports he leg feels heavy that is why she needs to stop and sit. Pt worked on Personnel officer this session with use of RW for UE support x 18 and x 20 ft with min assist, cues for heel toe patten L LE and foot clearance. Pt performed stand pivot back to w/c with min assist, cues for techniques and RW management. Pt transported back to room and left in w/c with needs in reach and chair alarm set.   Therapy Documentation Precautions:  Precautions Precautions: Fall Precaution Comments: L hemiparesis/watch BP Restrictions Weight Bearing Restrictions: No   Therapy/Group: Individual  Therapy  Netta Corrigan, PT, DPT, CSRS 09/27/2019, 7:51 AM

## 2019-09-27 NOTE — Plan of Care (Signed)
  Problem: Consults Goal: RH STROKE PATIENT EDUCATION Description: See Patient Education module for education specifics  Outcome: Progressing   Problem: RH BOWEL ELIMINATION Goal: RH STG MANAGE BOWEL WITH ASSISTANCE Description: STG Manage Bowel with [place]. Outcome: Progressing   Problem: RH BLADDER ELIMINATION Goal: RH STG MANAGE BLADDER WITH ASSISTANCE Description: STG Manage Bladder With Min Assistance Outcome: Progressing   Problem: RH SKIN INTEGRITY Goal: RH STG SKIN FREE OF INFECTION/BREAKDOWN Description: No new breakdown with min assistance/cues from staff  Outcome: Progressing   Problem: RH SAFETY Goal: RH STG ADHERE TO SAFETY PRECAUTIONS W/ASSISTANCE/DEVICE Description: STG Adhere to Safety Precautions With Min Assistance/Device. Outcome: Progressing   Problem: RH PAIN MANAGEMENT Goal: RH STG PAIN MANAGED AT OR BELOW PT'S PAIN GOAL Description: <3 out of 10.  Outcome: Progressing   Problem: RH KNOWLEDGE DEFICIT Goal: RH STG INCREASE KNOWLEDGE OF HYPERTENSION Description: Pt will verbalize management of hypertension including medications and stress management with handouts and cues  Outcome: Progressing Goal: RH STG INCREASE KNOWLEDGE OF STROKE PROPHYLAXIS Description: Pt will be able to verbalize stroke prevention techniques with min assistance, cues, and educational material.  Outcome: Progressing

## 2019-09-27 NOTE — Progress Notes (Signed)
Speech Language Pathology Daily Session Note  Patient Details  Name: [name] MRN: [id number] Date of Birth: June 09, 1960  Today's Date: 09/27/2019 SLP Individual Time: 0488-8916 SLP Individual Time Calculation (min): 56 min  Short Term Goals: Week 1: SLP Short Term Goal 1 (Week 1): Patient will utilize an increased vocal intensity at the sentence level to achieve 100% intelligibility with supervision verbal cues. SLP Short Term Goal 2 (Week 1): Patient will demonstrate functional problem solving for mildly complex tasks with Min verbal cues. SLP Short Term Goal 3 (Week 1): Patient will utilize memory compensatory strategies to recall new, daily information with Min verbal cues. SLP Short Term Goal 4 (Week 1): Patient will demonstrate emergent awrenes of errors during functional tasks with Min verbal cues. SLP Short Term Goal 5 (Week 1): Patient will demonstrate selective attention to functional tasks in a mildly distracting enviornment for ~30 minutes with Min verbal cues for redirection.  Skilled Therapeutic Interventions: Pt was seen for skilled ST targeting cognition. Pt completed a semi-complex monthly scheduling/calendar task with overall Supervision A verbal cues for semi-complex problem solving. She was Mod I for organization and also demonstrated ability to alternate attention between this task, TV distraction, and intermittent interruptions throughout session without any cues. Pt's speech was 100% intelligible in conversation with appropriate vocal intensity. Pt left sitting in bed with alarm set and needs within reach. Continue per current plan of care.           Pain Pain Assessment Pain Scale: 0-10 Pain Score: 0-No pain  Therapy/Group: Individual Therapy  Arbutus Leas 09/27/2019, 6:55 AM

## 2019-09-27 NOTE — Progress Notes (Signed)
Subjective:  Seen in room , no cos,denies any urinary symptoms ," Make very little urine "  Objective Vital signs in last 24 hours: Vitals:   09/26/19 0546 09/26/19 1417 09/26/19 1948 09/27/19 0507  BP: (!) 110/59 105/63 127/68 (!) 116/57  Pulse: 89 89 92 85  Resp: 19 20 19 19   Temp: 98.3 F (36.8 C) 97.8 F (36.6 C) 97.6 F (36.4 C) 98.2 F (36.8 C)  TempSrc: Oral Oral Oral Oral  SpO2: 99% 100% 100% 99%  Weight:      Height:       Weight change:   Physical Exam General:alert,Chronically ill appearing female ,NAD Heart:RRR, no murmur  Lungs:CTAB Abdomen:soft, non-tender Extremities:no LE edema Dialysis Access:LUE AVF + bruit  Dialysis Orders: [facility] 4h 400/800 105.5kg 3K/2.25 bath UFP #3 LUE AVF Hep 2000 - Hectoral 63mcg Iv q HD - Mircera 245mcg IV q 2 weeks (last 5/10) - Venofer 100 x 10 ordered --> for Hgb 8.5, tsat 7% on 5/10.  Problem/Plan: 1. Acute L cerebellar + subcentimeter deep white matter R DUK:[id number] L sided weakness and facial droop, improving. Echo normal. CTA without carotid or posterior circulation stenoses. Prior CVA on imaging that she was unaware of.Neuro followed, plan is for 30d event monitor to r/o A-fib./// transitioned to CIR  2. ESRD:Continue HD per MWF schedule-hd today  3. Hypertension/volume:PermissiveHTN w/ acute CVA.BP now  controlled/low sided. Below EDW. No edema on exam.  HR improved now that back on metoprolol. 4. Anemia(ESRD + IDA):Hgb 10.2. Next ESA due 5/24 -monitor for now. On high dose ESA as outpatient. 5. Metabolic bone disease:Ca ok, Phos better -resumedhome binder Lorin Picket). 6. T2DM- per admit  7. Hyperlipidemia: Prev on statin - off for unknown time, resumed here. 8. Leukocytosis:WBC 16.9 rising and now down  to 12 , asymptomatic UTI = Staph  .>100,000 with Admit team rx Amox  9. Gastroparesis: Chronic issue.   Ernest Haber, PA-C [facility]  [phone] 09/27/2019,9:35 AM  LOS: 4 days   Labs: Basic Metabolic Panel: Recent Labs  Lab 09/22/19 0443 09/23/19 0244 09/26/19 0231  NA 140 135 133*  K 4.8 4.7 4.2  CL 97* 93* 92*  CO2 27 26 27   GLUCOSE 128* 137* 117*  BUN 43* 33* 32*  CREATININE 10.24* 7.33* 7.96*  CALCIUM 9.7 9.8 9.8  PHOS 5.7* 5.7* 5.1*   Liver Function Tests: Recent Labs  Lab 09/22/19 0443 09/23/19 0244 09/26/19 0231  ALBUMIN 3.7 3.8 3.4*   No results for input(s): LIPASE, AMYLASE in the last 168 hours. No results for input(s): AMMONIA in the last 168 hours. CBC: Recent Labs  Lab 09/20/19 1324 09/20/19 1324 09/21/19 0240 09/21/19 0240 09/22/19 0443 09/23/19 0244 09/26/19 0231  WBC 14.9*   < > 17.4*  16.9*   < > 16.4* 17.2* 12.2*  NEUTROABS  --   --  14.9*  --   --   --  8.4*  HGB 12.2   < > 11.4*  11.1*   < > 11.1* 11.0* 10.2*  HCT 42.8   < > 40.0  39.7   < > 38.1 39.0 35.3*  MCV 80.9  --  81.6  81.7  --  79.9* 80.7 80.0  PLT 582*   < > 632*  636*   < > 581* 551* 466*   < > = values in this interval not displayed.   Cardiac Enzymes: No results for input(s): CKTOTAL, CKMB, CKMBINDEX, TROPONINI in the last 168 hours. CBG: No results for  input(s): GLUCAP in the last 168 hours.  Studies/Results: No results found. Medications:  . aspirin EC  81 mg Oral Daily  . atorvastatin  40 mg Oral Daily  . clopidogrel  75 mg Oral Daily  . feeding supplement (NEPRO CARB STEADY)  237 mL Oral TID BM  . ferric citrate  420 mg Oral TID WC  . heparin  5,000 Units Subcutaneous Q8H  . metoprolol tartrate  50 mg Oral BID  . multivitamin  1 tablet Oral QHS  . ondansetron  4 mg Oral Q12H

## 2019-09-27 NOTE — Progress Notes (Signed)
[place] PHYSICAL MEDICINE & REHABILITATION PROGRESS NOTE   Subjective/Complaints:  No issues overnite, cognitive screeing with SLP in progress but going very well so far  Review of systems -  Pt denies SOB, abd pain, CP, N/V/C/D, and vision changes   Objective:   No results found. Recent Labs    09/26/19 0231  WBC 12.2*  HGB 10.2*  HCT 35.3*  PLT 466*   Recent Labs    09/26/19 0231  NA 133*  K 4.2  CL 92*  CO2 27  GLUCOSE 117*  BUN 32*  CREATININE 7.96*  CALCIUM 9.8    Intake/Output Summary (Last 24 hours) at 09/27/2019 0902 Last data filed at 09/27/2019 6759 Gross per 24 hour  Intake 920 ml  Output -  Net 920 ml     Physical Exam: Vital Signs Blood pressure (!) 116/57, pulse 85, temperature 98.2 F (36.8 C), temperature source Oral, resp. rate 19, height 5' 9.02" (1.753 m), weight 101.1 kg, SpO2 99 %.   General: No acute distress Mood and affect are appropriate Heart: Regular rate and rhythm no rubs murmurs or extra sounds Lungs: Clear to auscultation, breathing unlabored, no rales or wheezes Abdomen: Positive bowel sounds, soft nontender to palpation, nondistended Extremities: No clubbing, cyanosis, or edema Skin: No evidence of breakdown, no evidence of rash   Neurologic: Cranial nerves II through XII intact, motor strength is 5/5 in Right 3-/5 Left  deltoid, bicep, tricep, grip, 4- hip flexor, knee extensors, ankle dorsiflexor and plantar flexor Sensory exam normal sensation to light touch and proprioception in bilateral upper and lower extremities Cerebellar exam Limited by weakness on Left side  Musculoskeletal: No pain with upper limb or lower limb active assistive range of motion   Assessment/Plan: 1. 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 also had a left cerebellar stroke but no significant limb ataxia which require 3+ hours per day of interdisciplinary therapy in a comprehensive  inpatient rehab setting.  Physiatrist is providing close team supervision and 24 hour management of active medical problems listed below.  Physiatrist and rehab team continue to assess barriers to discharge/monitor patient progress toward functional and medical goals  Care Tool:  Bathing    Body parts bathed by patient: Left arm, Chest, Abdomen, Front perineal area, Right upper leg, Left upper leg, Face, Right lower leg, Buttocks, Left lower leg   Body parts bathed by helper: Right arm     Bathing assist Assist Level: Minimal Assistance - Patient > 75%     Upper Body Dressing/Undressing Upper body dressing   What is the patient wearing?: Pull over shirt    Upper body assist Assist Level: Moderate Assistance - Patient 50 - 74%    Lower Body Dressing/Undressing Lower body dressing      What is the patient wearing?: Pants     Lower body assist Assist for lower body dressing: Minimal Assistance - Patient > 75%     Toileting Toileting Toileting Activity did not occur (Clothing management and hygiene only): N/A (no void or bm)  Toileting assist       Transfers Chair/bed transfer  Transfers assist     Chair/bed transfer assist level: Minimal Assistance - Patient > 75%     Locomotion Ambulation   Ambulation assist      Assist level: Minimal Assistance - Patient > 75% Assistive device: Walker-rolling Max distance: 20   Walk 10 feet activity   Assist     Assist level: Minimal Assistance -  Patient > 75% Assistive device: Walker-rolling   Walk 50 feet activity   Assist Walk 50 feet with 2 turns activity did not occur: Safety/medical concerns         Walk 150 feet activity   Assist Walk 150 feet activity did not occur: Safety/medical concerns         Walk 10 feet on uneven surface  activity   Assist     Assist level: Moderate Assistance - Patient - 50 - 74% Assistive device: Hand held assist, Other (comment)(R hand rail)    Wheelchair     Assist Will patient use wheelchair at discharge?: No             Wheelchair 50 feet with 2 turns activity    Assist            Wheelchair 150 feet activity     Assist          Blood pressure (!) 116/57, pulse 85, temperature 98.2 F (36.8 C), temperature source Oral, resp. rate 19, height 5' 9.02" (1.753 m), weight 101.1 kg, SpO2 99 %.  Medical Problem List and Plan: 1.Left-sided weakness and dysarthriasecondary to 2 small infarcts left cerebellar deep nucleusand posterior to the atrium of the right lateral ventricle most likely separate small vessel disease. Plan 30-day cardiac event monitor as outpatient -patient may shower -ELOS/Goals: 2-2.5 weeks; goals supervision to mod I 2. Antithrombotics: -DVT/anticoagulation:Subcutaneous heparin -antiplatelet therapy: Aspirin and Plavix x3 weeks then Plavix alone 3. Pain Management:Tylenol as needed 4. Mood:Provide emotional support -antipsychotic agents: N/A 5. Neuropsych: This patientiscapable of making decisions on herown behalf. 6. Skin/Wound Care:Routine skin checks 7. Fluids/Electrolytes/Nutrition:Routine in and outs with follow-up chemistries 8. End-stage renal disease. Continue hemodialysis as directed by Nephro 9. Hyperlipidemia. Lipitor 10. Diet controlled diabetes mellitus. Hemoglobin A1c 6.7. Blood sugar checks currently discontinued  11. Permissive hypertension/tachycardia. Lopressor initiated 50mg  twice daily 09/22/2019. Monitor with increased mobility and titrate as required.   Vitals:   09/26/19 1948 09/27/19 0507  BP: 127/68 (!) 116/57  Pulse: 92 85  Resp: 19 19  Temp: 97.6 F (36.4 C) 98.2 F (36.8 C)  SpO2: 100% 99%  5/24- controlled   12/ UTI?, leukocytosis  5/22- Last WBC was 17.2- will recheck in AM- no urine Cx was able to be gotten by nursing- will con't to try - on no ABX- will  monitor  5/23- Nursing got Cx- is staph capitus >100k- WBC is down to 12.2 from >17k- afebrile; pt feels good- sens to oxacillin, start amoxil     LOS: 4 days A FACE TO FACE EVALUATION WAS PERFORMED  Charlett Blake 09/27/2019, 9:02 AM

## 2019-09-27 NOTE — Progress Notes (Signed)
Occupational Therapy Session Note  Patient Details  Name: [name] MRN: [id number] Date of Birth: Sep 21, 1960  Today's Date: 09/27/2019 OT Individual Time: 7510-2585 OT Individual Time Calculation (min): 54 min    Short Term Goals: Week 1:  OT Short Term Goal 2 (Week 1): Patient will don UB clothing with supervision A. OT Short Term Goal 3 (Week 1): Patient will don LB clothing with Min A in sitting/standing with LRAD. OT Short Term Goal 4 (Week 1): Patient will complete 3/3 parts of toileting with Min A.  Skilled Therapeutic Interventions/Progress Updates:    Pt up in wheelchair to start session.  She was sitting with the LUE flexed.  Noted slight increased resistance/tone in the left elbow when therapist tried to range her UE.  She was able to actively extend her elbow as well as reach up to 90 degrees with encouragement and increased tone.  Took her down to the tub room for practice with tub transfers using the tub bench and RW for support.  She was able to complete transfer with min assist and mod instructional cueing.  She was then taken to the therapy gym where she completed transfer stand pivot to the therapy mat at min assist level.  Mod instructional cueing to stay inside the RW as well.  She worked on LUE coordination and reach by picking up small foam pieces and placing them in a cup that therapist provided.  She had to incorporate movements of elbow extension with shoulder flexion and horizontal abduction.  She was able to grasp and release the small pieces of foam efficiently as well.  Increased time needed to complete each repetition with movements completed in a slow pattern compared to a normal reach with the LUE.  Mod demonstrational cueing was needed for maintain upright anterior pelvic tilt through each movement.  Finished session with return to the wheelchair and then therapist pushing her back to the room where she was left sitting up in the wheelchair with her sister present  and call button and phone in reach.  Safety belt in place as well with pt encouraged to work more on picking up and placing the pieces of foam with the LUE throughout her stay.    Therapy Documentation Precautions:  Precautions Precautions: Fall Precaution Comments: L hemiparesis/watch BP Restrictions Weight Bearing Restrictions: No  Pain: Pain Assessment Pain Scale: Faces Pain Score: 0-No pain ADL: See Care Tool Section for some details of mobility and selfcare  Therapy/Group: Individual Therapy  MCGUIRE,JAMES OTR/L 09/27/2019, 12:31 PM

## 2019-09-28 ENCOUNTER — Inpatient Hospital Stay (HOSPITAL_COMMUNITY): Payer: BC Managed Care – PPO | Admitting: Occupational Therapy

## 2019-09-28 ENCOUNTER — Inpatient Hospital Stay (HOSPITAL_COMMUNITY): Payer: BC Managed Care – PPO | Admitting: Speech Pathology

## 2019-09-28 ENCOUNTER — Inpatient Hospital Stay (HOSPITAL_COMMUNITY): Payer: BC Managed Care – PPO

## 2019-09-28 MED ORDER — DARBEPOETIN ALFA 200 MCG/0.4ML IJ SOSY
200.0000 ug | PREFILLED_SYRINGE | INTRAMUSCULAR | Status: DC
  Administered 2019-09-29 – 2019-10-06 (×2): 200 ug via INTRAVENOUS
  Filled 2019-09-28 (×2): qty 0.4

## 2019-09-28 NOTE — Plan of Care (Signed)
  Problem: Consults Goal: RH STROKE PATIENT EDUCATION Description: See Patient Education module for education specifics  Outcome: Progressing   Problem: RH BOWEL ELIMINATION Goal: RH STG MANAGE BOWEL WITH ASSISTANCE Description: STG Manage Bowel with Mod I Assistance. Outcome: Progressing   Problem: RH BLADDER ELIMINATION Goal: RH STG MANAGE BLADDER WITH ASSISTANCE Description: STG Manage Bladder With Mod I Assistance Outcome: Progressing   

## 2019-09-28 NOTE — Progress Notes (Signed)
[place] PHYSICAL MEDICINE & REHABILITATION PROGRESS NOTE   Subjective/Complaints:    Review of systems -  Pt denies SOB, abd pain, CP, N/V/C/D, and vision changes   Objective:   No results found. Recent Labs    09/26/19 0231 09/27/19 1257  WBC 12.2* 11.3*  HGB 10.2* 9.9*  HCT 35.3* 33.4*  PLT 466* 444*   Recent Labs    09/26/19 0231 09/27/19 1257  NA 133* 127*  K 4.2 3.6  CL 92* 87*  CO2 27 23  GLUCOSE 117* 203*  BUN 32* 56*  CREATININE 7.96* 11.17*  CALCIUM 9.8 9.4    Intake/Output Summary (Last 24 hours) at 09/28/2019 0817 Last data filed at 09/27/2019 1653 Gross per 24 hour  Intake 120 ml  Output 1500 ml  Net -1380 ml     Physical Exam: Vital Signs Blood pressure 104/64, pulse 90, temperature 98.5 F (36.9 C), resp. rate 20, height 5' 9.02" (1.753 m), weight 103.2 kg, SpO2 99 %.   General: No acute distress Mood and affect are appropriate Heart: Regular rate and rhythm no rubs murmurs or extra sounds Lungs: Clear to auscultation, breathing unlabored, no rales or wheezes Abdomen: Positive bowel sounds, soft nontender to palpation, nondistended Extremities: No clubbing, cyanosis, or edema Skin: No evidence of breakdown, no evidence of rash   Neurologic: Cranial nerves II through XII intact, motor strength is 5/5 in Right 3-/5 Left  deltoid, bicep, tricep, grip, 4- hip flexor, knee extensors, ankle dorsiflexor and plantar flexor Sensory exam normal sensation to light touch and proprioception in bilateral upper and lower extremities Good fine motor No dysmetria with L FNF but moving very slowly  Musculoskeletal: No pain with upper limb or lower limb active assistive range of motion   Assessment/Plan: 1. 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 also had a left cerebellar stroke but no significant limb ataxia which require 3+ hours per day of interdisciplinary therapy in a comprehensive inpatient  rehab setting.  Physiatrist is providing close team supervision and 24 hour management of active medical problems listed below.  Physiatrist and rehab team continue to assess barriers to discharge/monitor patient progress toward functional and medical goals  Care Tool:  Bathing    Body parts bathed by patient: Left arm, Chest, Abdomen, Front perineal area, Right upper leg, Left upper leg, Face, Right lower leg, Buttocks, Left lower leg   Body parts bathed by helper: Right arm     Bathing assist Assist Level: Minimal Assistance - Patient > 75%     Upper Body Dressing/Undressing Upper body dressing   What is the patient wearing?: Pull over shirt    Upper body assist Assist Level: Moderate Assistance - Patient 50 - 74%    Lower Body Dressing/Undressing Lower body dressing      What is the patient wearing?: Pants     Lower body assist Assist for lower body dressing: Minimal Assistance - Patient > 75%     Toileting Toileting Toileting Activity did not occur (Clothing management and hygiene only): N/A (no void or bm)  Toileting assist       Transfers Chair/bed transfer  Transfers assist     Chair/bed transfer assist level: Minimal Assistance - Patient > 75%     Locomotion Ambulation   Ambulation assist      Assist level: Minimal Assistance - Patient > 75% Assistive device: Walker-rolling Max distance: 20   Walk 10 feet activity   Assist     Assist  level: Minimal Assistance - Patient > 75% Assistive device: Walker-rolling   Walk 50 feet activity   Assist Walk 50 feet with 2 turns activity did not occur: Safety/medical concerns         Walk 150 feet activity   Assist Walk 150 feet activity did not occur: Safety/medical concerns         Walk 10 feet on uneven surface  activity   Assist     Assist level: Moderate Assistance - Patient - 50 - 74% Assistive device: Hand held assist, Other (comment)(R hand rail)    Wheelchair     Assist Will patient use wheelchair at discharge?: No             Wheelchair 50 feet with 2 turns activity    Assist            Wheelchair 150 feet activity     Assist          Blood pressure 104/64, pulse 90, temperature 98.5 F (36.9 C), resp. rate 20, height 5' 9.02" (1.753 m), weight 103.2 kg, SpO2 99 %.  Medical Problem List and Plan: 1.Left-sided weakness and dysarthriasecondary to 2 small infarcts left cerebellar deep nucleusand posterior to the atrium of the right lateral ventricle most likely separate small vessel disease. Plan 30-day cardiac event monitor as outpatient Unusual presentation- bradykinesia without tremor Cerebellar infarct not causing dysmetria  -patient may shower -ELOS/Goals: 2-2.5 weeks; goals supervision to mod I 2. Antithrombotics: -DVT/anticoagulation:Subcutaneous heparin -antiplatelet therapy: Aspirin and Plavix x3 weeks then Plavix alone 3. Pain Management:Tylenol as needed 4. Mood:Provide emotional support -antipsychotic agents: N/A 5. Neuropsych: This patientiscapable of making decisions on herown behalf. 6. Skin/Wound Care:Routine skin checks 7. Fluids/Electrolytes/Nutrition:Routine in and outs with follow-up chemistries- Hyponatremia, nephro to manage with HD  8. End-stage renal disease. Continue hemodialysis as directed by Nephro 9. Hyperlipidemia. Lipitor 10. Diet controlled diabetes mellitus. Hemoglobin A1c 6.7. Blood sugar checks currently discontinued  11. Permissive hypertension/tachycardia. Lopressor initiated 50mg  twice daily 09/22/2019. Monitor with increased mobility and titrate as required.   Vitals:   09/27/19 1933 09/28/19 0341  BP: 111/62 104/64  Pulse: 96 90  Resp:    Temp: (!) 97.5 F (36.4 C) 98.5 F (36.9 C)  SpO2: 100% 99%  5/25- controlled   12/ UTI,   5/22- Last WBC was 17.2- will recheck in AM- no  urine Cx was able to be gotten by nursing- will con't to try - on no ABX- will monitor  5/23- Nursing got Cx- is staph capitus >100k- WBC is down to 12.2 from >17k- afebrile; pt feels good- sens to oxacillin, start amoxil     LOS: 5 days A FACE TO FACE EVALUATION WAS PERFORMED  Charlett Blake 09/28/2019, 8:17 AM

## 2019-09-28 NOTE — Progress Notes (Signed)
Speech Language Pathology Daily Session Note  Patient Details  Name: [name] MRN: [id number] Date of Birth: 13-Jul-1960  Today's Date: 09/28/2019 SLP Individual Time: 0160-1093 SLP Individual Time Calculation (min): 42 min  Short Term Goals: Week 1: SLP Short Term Goal 1 (Week 1): Patient will utilize an increased vocal intensity at the sentence level to achieve 100% intelligibility with supervision verbal cues. SLP Short Term Goal 2 (Week 1): Patient will demonstrate functional problem solving for mildly complex tasks with Min verbal cues. SLP Short Term Goal 3 (Week 1): Patient will utilize memory compensatory strategies to recall new, daily information with Min verbal cues. SLP Short Term Goal 4 (Week 1): Patient will demonstrate emergent awrenes of errors during functional tasks with Min verbal cues. SLP Short Term Goal 5 (Week 1): Patient will demonstrate selective attention to functional tasks in a mildly distracting enviornment for ~30 minutes with Min verbal cues for redirection.  Skilled Therapeutic Interventions: Pt was seen for skilled ST targeting cognition. SLP facilitated session with a complex medication management task. Pt recalled 2/8 current medication names and/or functions, but required Total A verbal review of remaining 6. She created a list of current medications with Supervision A verbal cues for organization as well as spelling accuracy. She then organized a TID pill box according to her list with overall Min A verbal cues for problem solving. She self corrected error X1. Pt selectively attended to tasks without need for interventions. Pt left sitting in chair with alarm set and needs within reach. Continue per current plan of care.         Pain Pain Assessment Pain Scale: 0-10 Pain Score: 0-No pain  Therapy/Group: Individual Therapy  Arbutus Leas 09/28/2019, 7:15 AM

## 2019-09-28 NOTE — Progress Notes (Signed)
Physical Therapy Session Note  Patient Details  Name: KHAMRYN CALDERONE MRN: [id number] Date of Birth: 1961/04/11  Today's Date: 09/28/2019 PT Individual Time: 1359-1455 PT Individual Time Calculation (min): 56 min   Short Term Goals: Week 1:  PT Short Term Goal 1 (Week 1): Pt will perform bed mobility with CGA. PT Short Term Goal 2 (Week 1): Pt will perform bed to chair transfer with CGA. PT Short Term Goal 3 (Week 1): Pt will ambulate 7' with CGA. PT Short Term Goal 4 (Week 1): Pt will perform 10 steps with CGA and BHRs.  Skilled Therapeutic Interventions/Progress Updates:     Pt received seated in WC and agreeable to therapy. Reports some pain in L knee during session but numerical rating not provided. PT provides rest breaks as needed to manage pain symptoms. WC transport to dayroom for energy conservation. Sit to stand with CGA throughout session. Pt performs gait training without RW. Bouts of 12', 12', 40', and 30'. PT provides minA at pelvis to facilitate lateral weight shifts and stability. Pt demos very short stride lengths, slow gait speed, and forward flexed posture. Gait pattern deficits slightly improved with PT verbal and tactile cues. Extended seated rest breaks between each bout.  Pt performs NM re-ed, standing at edge of mat table with PT blocking L knee and pt performing forward and sidesteps with RLE to promote stance phase stability and weight shifts onto LLE. Pt anxious with activity and has one perceived LOB making her very fearful, and pt requests to perform different activity. PT provided minA throughout for weight shifting and stability.  Pt ambulates 33' with RW and and min/CGA from PT. Sit to supine with minA to manage LLE. Pt left supine in bed with alarm intact and all needs within reach.  Therapy Documentation Precautions:  Precautions Precautions: Fall Precaution Comments: L hemiparesis/watch BP Restrictions Weight Bearing Restrictions: No    Therapy/Group:  Individual Therapy  Breck Coons, PT, DPT 09/28/2019, 3:45 PM

## 2019-09-28 NOTE — Progress Notes (Signed)
Subjective:  Sitting in bedside chair no cos , tolerated HD yesterday  1.5 liter  uf    Objective Vital signs in last 24 hours: Vitals:   09/27/19 1653 09/27/19 1900 09/27/19 1933 09/28/19 0341  BP: 120/61 137/60 111/62 104/64  Pulse: 94 96 96 90  Resp: 18 20    Temp:  97.8 F (36.6 C) (!) 97.5 F (36.4 C) 98.5 F (36.9 C)  TempSrc:      SpO2:  100% 100% 99%  Weight:      Height:       Weight change:   Physical Exam General:alert,Chronically ill appearing female ,NAD Heart:RRR, no murmur Lungs:CTAB Abdomen:soft, non-tender Extremities:no LE edema Dialysis Access:LUE AVF + bruit  Dialysis Orders: [facility] 4h 400/800 105.5kg 3K/2.25 bath UFP #3 LUE AVF Hep 2000 - Hectoral 1mcg Iv q HD - Mircera 261mcg IV q 2 weeks (last 5/10) - Venofer 100 x 10 ordered --> for Hgb 8.5, tsat 7% on 5/10.  Problem/Plan: 1. Acute L cerebellar + subcentimeter deep white matter R JXB:[id number] L sided weakness and facial droop, improving. Echo normal. CTA without carotid or posterior circulation stenoses. Prior CVA on imaging that she was unaware of.Neuro followed, plan is for 30d event monitor to r/o A-fib./// transitioned to CIR  2. ESRD:Continue HD per MWF schedule-hd today  3. Hypertension/volume:PermissiveHTN w/ acute CVA.BP now controlled/low sided. Below EDW.No edema on exam.HR improved now that back on metoprolol. 4. Anemia(ESRD + IDA):Hgb 10.2>this am 9.9. Next ESA was due 5/24 -not in orders? will give  200 aranesp   Hd 5/26//,On high dose ESA as outpatient. 5. Metabolic bone disease:Ca ok,Phos better -resumedhome binder Lorin Picket). 6. T2DM- per admit  7. Hyperlipidemia: Prev on statin - off for unknown time, resumed here. 8. Leukocytosis:WBC 16.9 rising and now down  to 12>11.3  , asymptomatic UTI = Staph  .>100,000 with Admit team rx Amox  9. Gastroparesis: Chronic issue.    Ernest Haber, PA-C [facility]  [phone] 09/28/2019,12:24 PM  LOS: 5 days   Labs: Basic Metabolic Panel: Recent Labs  Lab 09/23/19 0244 09/26/19 0231 09/27/19 1257  NA 135 133* 127*  K 4.7 4.2 3.6  CL 93* 92* 87*  CO2 26 27 23   GLUCOSE 137* 117* 203*  BUN 33* 32* 56*  CREATININE 7.33* 7.96* 11.17*  CALCIUM 9.8 9.8 9.4  PHOS 5.7* 5.1* 4.0   Liver Function Tests: Recent Labs  Lab 09/23/19 0244 09/26/19 0231 09/27/19 1257  ALBUMIN 3.8 3.4* 3.5   No results for input(s): LIPASE, AMYLASE in the last 168 hours. No results for input(s): AMMONIA in the last 168 hours. CBC: Recent Labs  Lab 09/22/19 0443 09/22/19 0443 09/23/19 0244 09/26/19 0231 09/27/19 1257  WBC 16.4*   < > 17.2* 12.2* 11.3*  NEUTROABS  --   --   --  8.4*  --   HGB 11.1*   < > 11.0* 10.2* 9.9*  HCT 38.1   < > 39.0 35.3* 33.4*  MCV 79.9*  --  80.7 80.0 79.7*  PLT 581*   < > 551* 466* 444*   < > = values in this interval not displayed.   Cardiac Enzymes: No results for input(s): CKTOTAL, CKMB, CKMBINDEX, TROPONINI in the last 168 hours. CBG: No results for input(s): GLUCAP in the last 168 hours.  Studies/Results: No results found. Medications:  . amoxicillin  250 mg Oral Daily  . aspirin EC  81 mg Oral Daily  . atorvastatin  40 mg Oral  Daily  . clopidogrel  75 mg Oral Daily  . feeding supplement (NEPRO CARB STEADY)  237 mL Oral TID BM  . ferric citrate  420 mg Oral TID WC  . heparin  5,000 Units Subcutaneous Q8H  . metoprolol tartrate  50 mg Oral BID  . multivitamin  1 tablet Oral QHS  . ondansetron  4 mg Oral Q12H

## 2019-09-28 NOTE — Progress Notes (Signed)
Occupational Therapy Session Note  Patient Details  Name: DEZHANE STATEN MRN: [id number] Date of Birth: Jul 03, 1960  Today's Date: 09/28/2019 OT Individual Time: 8412-8208 OT Individual Time Calculation (min): 56 min    Short Term Goals: Week 1:  OT Short Term Goal 2 (Week 1): Patient will don UB clothing with supervision A. OT Short Term Goal 3 (Week 1): Patient will don LB clothing with Min A in sitting/standing with LRAD. OT Short Term Goal 4 (Week 1): Patient will complete 3/3 parts of toileting with Min A.  Skilled Therapeutic Interventions/Progress Updates:    Pt completed supine to sit EOB with min assist to start session and the completed transfer to the wheelchair at min assist level with use of the RW for support.  At the sink she worked on grooming tasks of washing her face and washing her hands.  She completed task with setup.  Remainder of session she worked on Research scientist (life sciences) by pushing the wheelchair.  She demonstrates slow movements in the LUE with increased time to complete mobility to short distances.  Increased fatigue noted with as well with multiple rest breaks and encouragement.   Finished session with return to the room and pt left sitting up in the wheelchair with the call button and phone in reach and safety belt in place.    Therapy Documentation Precautions:  Precautions Precautions: Fall Precaution Comments: L hemiparesis/watch BP Restrictions Weight Bearing Restrictions: No   Pain: Pain Assessment Pain Scale: 0-10 Pain Score: 0-No pain ADL: See Care Tool Section for some details of mobility and selfcare  Therapy/Group: Individual Therapy  Ermon Sagan OTR/L 09/28/2019, 12:17 PM

## 2019-09-28 NOTE — Progress Notes (Signed)
Occupational Therapy Session Note  Patient Details  Name: [name] MRN: [id number] Date of Birth: 1960-08-15  Today's Date: 09/28/2019 OT Individual Time: 1302-1330 OT Individual Time Calculation (min): 28 min   Short Term Goals: Week 1:  OT Short Term Goal 2 (Week 1): Patient will don UB clothing with supervision A. OT Short Term Goal 3 (Week 1): Patient will don LB clothing with Min A in sitting/standing with LRAD. OT Short Term Goal 4 (Week 1): Patient will complete 3/3 parts of toileting with Min A.  Skilled Therapeutic Interventions/Progress Updates:    Pt greeted sitting in wc after eating lunch and agreeable to OT treatment session. Pt brought down to therapy gym in wc for time management. Pt issued soft, pink theraputty and worked on L hand grip, pinch, and fine motor control. Used putty to make a ball, flatten on table, make a log and isolated pinch. Progressed to picking small beads out of putty for more fine motor task. Pt returned to room at end of session and left seated in wc with alarm belt on, call bell in reach, and needs met.   Therapy Documentation Precautions:  Precautions Precautions: Fall Precaution Comments: L hemiparesis/watch BP Restrictions Weight Bearing Restrictions: No Pain: Pain Assessment Pain Scale: 0-10 Pain Score: 0-No pain Other Treatments:     Therapy/Group: Individual Therapy  Valma Cava 09/28/2019, 1:29 PM

## 2019-09-29 ENCOUNTER — Inpatient Hospital Stay (HOSPITAL_COMMUNITY): Payer: BC Managed Care – PPO | Admitting: Speech Pathology

## 2019-09-29 ENCOUNTER — Inpatient Hospital Stay (HOSPITAL_COMMUNITY): Payer: BC Managed Care – PPO

## 2019-09-29 ENCOUNTER — Inpatient Hospital Stay (HOSPITAL_COMMUNITY): Payer: BC Managed Care – PPO | Admitting: Occupational Therapy

## 2019-09-29 LAB — RENAL FUNCTION PANEL
Albumin: 3.3 g/dL — ABNORMAL LOW (ref 3.5–5.0)
Anion gap: 13 (ref 5–15)
BUN: 41 mg/dL — ABNORMAL HIGH (ref 6–20)
CO2: 25 mmol/L (ref 22–32)
Calcium: 9.4 mg/dL (ref 8.9–10.3)
Chloride: 90 mmol/L — ABNORMAL LOW (ref 98–111)
Creatinine, Ser: 9.96 mg/dL — ABNORMAL HIGH (ref 0.44–1.00)
GFR calc Af Amer: 4 mL/min — ABNORMAL LOW (ref >60)
GFR calc non Af Amer: 4 mL/min — ABNORMAL LOW (ref >60)
Glucose, Bld: 170 mg/dL — ABNORMAL HIGH (ref 70–99)
Phosphorus: 3.9 mg/dL (ref 2.5–4.6)
Potassium: 3.6 mmol/L (ref 3.5–5.1)
Sodium: 128 mmol/L — ABNORMAL LOW (ref 135–145)

## 2019-09-29 LAB — CBC
HCT: 30.9 % — ABNORMAL LOW (ref 36.0–46.0)
Hemoglobin: 9.3 g/dL — ABNORMAL LOW (ref 12.0–15.0)
MCH: 23.8 pg — ABNORMAL LOW (ref 26.0–34.0)
MCHC: 30.1 g/dL (ref 30.0–36.0)
MCV: 79.2 fL — ABNORMAL LOW (ref 80.0–100.0)
Platelets: 396 10*3/uL (ref 150–400)
RBC: 3.9 MIL/uL (ref 3.87–5.11)
RDW: 19.1 % — ABNORMAL HIGH (ref 11.5–15.5)
WBC: 8.6 10*3/uL (ref 4.0–10.5)
nRBC: 0 % (ref 0.0–0.2)

## 2019-09-29 MED ORDER — HEPARIN SODIUM (PORCINE) 1000 UNIT/ML IJ SOLN: 3000.0000 [IU] | Freq: Once | INTRAMUSCULAR | Status: AC

## 2019-09-29 MED ORDER — HEPARIN SODIUM (PORCINE) 1000 UNIT/ML IJ SOLN
INTRAMUSCULAR | Status: AC
  Administered 2019-09-29: 3000 [IU] via INTRAVENOUS
  Filled 2019-09-29: qty 3

## 2019-09-29 MED ORDER — DARBEPOETIN ALFA 200 MCG/0.4ML IJ SOSY
PREFILLED_SYRINGE | INTRAMUSCULAR | Status: AC
  Filled 2019-09-29: qty 0.4

## 2019-09-29 NOTE — Progress Notes (Addendum)
Speech Language Pathology Daily Session Note  Patient Details  Name: [name] MRN: [id number] Date of Birth: 04-07-1961  Today's Date: 09/29/2019 SLP Individual Time: 0730-0828 SLP Individual Time Calculation (min): 58 min  Short Term Goals: Week 1: SLP Short Term Goal 1 (Week 1): Patient will utilize an increased vocal intensity at the sentence level to achieve 100% intelligibility with supervision verbal cues. SLP Short Term Goal 2 (Week 1): Patient will demonstrate functional problem solving for mildly complex tasks with Min verbal cues. SLP Short Term Goal 3 (Week 1): Patient will utilize memory compensatory strategies to recall new, daily information with Min verbal cues. SLP Short Term Goal 4 (Week 1): Patient will demonstrate emergent awrenes of errors during functional tasks with Min verbal cues. SLP Short Term Goal 5 (Week 1): Patient will demonstrate selective attention to functional tasks in a mildly distracting enviornment for ~30 minutes with Min verbal cues for redirection.  Skilled Therapeutic Interventions: Pt was seen for skilled ST targeting cognitive goals (recall, problem solving, alternating attention). Pt recalled medications with Min A verbal cues. Pt able to then use "chunking" strategy to recall general number and functions of medications with Supervision A. Pt used list of current meds to organize a TID pill box with Min A for error awareness, although she corrected errors with Supervision A for problem solving. Pt demonstrated ability to alternate attention between functional tasks, conversation, and RN/MD needs with only Supervision A prompts. Pt used appropriate vocal intensity and was 100% intelligible throughout session. Pt left laying in bed with alarm set and needs within reach. Continue per current plan of care.      Pain Pain Assessment Pain Scale: 0-10 Pain Score: 0-No pain   Therapy/Group: Individual Therapy  Arbutus Leas 09/29/2019, 6:54 AM

## 2019-09-29 NOTE — Patient Care Conference (Signed)
Inpatient RehabilitationTeam Conference and Plan of Care Update Date: 09/29/2019   Time: 2:02 PM    Patient Name: [name]      Medical Record Number: [id number]  Date of Birth: 1960/12/11 Sex: Female         Room/Bed: 4W02C/4W02C-01 Payor Info: Payor: [employer] / Plan: [employer] PPO / Product Type: *No Product type* /    Admit Date/Time:  09/23/2019  4:18 PM  Primary Diagnosis:  Cerebellar cerebrovascular accident (CVA) without late effect  Patient Active Problem List   Diagnosis Date Noted  . 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[place])   . Leukocytosis   . Left-sided weakness   . Acute ischemic stroke ([place]) 09/19/2019  . Stroke ([place]) 09/18/2019  . Intractable nausea and vomiting 04/20/2019  . Atypical chest pain 04/02/2019  . Type 2 diabetes mellitus without complications ([place]) 52/77/8242  . Anemia in chronic kidney disease 02/19/2019  . ESRD (end stage renal disease) ([place]) 02/19/2019  . Secondary hyperparathyroidism of renal origin ([place]) 02/19/2019  . Coagulation defect, unspecified ([place]) 02/19/2019  . Venous (peripheral) insufficiency 08/06/2017  . Encounter for screening for respiratory tuberculosis 01/01/2016  . Hypertension   . 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 Diabetic gastroparesis ([place]) 08/09/2015  . Low back pain 06/09/2014  . Nausea and vomiting 01/18/2012  . Diarrhea, unspecified 01/18/2012  . Obstructive sleep apnea of adult 01/16/2012  . GERD (gastroesophageal reflux disease) 01/16/2012  . Osteoarthritis of both knees 01/16/2012  . Hyperlipidemia, unspecified 07/18/2011  . LEIOMYOMA, UTERUS 01/14/2007  . Morbid obesity ([place]) 07/03/2006  . Former smoker 07/03/2006  . Tension headache 07/03/2006    Expected Discharge Date: Expected Discharge Date: 10/07/19  Team Members Present: Physician leading conference: Dr.  Alysia Penna Care Coodinator Present: Nestor Lewandowsky, RN, BSN, CRRN;Christina Sampson Goon, [place] Nurse Present: Other (comment)(Taryn Laurance Flatten , Rn) PT Present: Michaelene Song, PT OT Present: Clyda Greener, OT SLP Present: Jettie Booze, CF-SLP PPS Coordinator present : Ileana Ladd, PT     Current Status/Progress Goal Weekly Team Focus  Bowel/Bladder   continent of bowel, oliguric, LBM 09/27/19  remain continent  monitor & assist as needed   Swallow/Nutrition/ Hydration             ADL's   Supervision for UB selfcare with min assist for LB selfcare sit to stand.  Min assist for transfers with use of the RW for support.  Decreased LUE coordination with slow movements noted but completely out of [place]  supervision to modified independent  selfcare retraining, transfer training, balance retraining, neuromuscular re-education, therapeutic exercise, DME education, pt education   Mobility   min assist for all mobility up to 20 ft with RW, stand pivot transfers with RW min assist, limited by fatigue  supervision  balance, gait, activity tolerance, transfers, L NMR, L attention   Communication   Mod I  Mod I  carryover increased vocal intensity in conversation   Safety/Cognition/ Behavioral Observations  Supervision-Min  Supervision (may upgrade)  complex problem solving, recall, alternating attention, erorr awareness   Pain   no c/o pain  pain scale <2/10  assess & treat as needed   Skin   no skin issues  no new skin break down  assess q shift    Rehab Goals Patient on target to meet rehab goals: Yes Rehab Goals Revised: On target *See Care Plan and progress notes  for long and short-term goals.     Barriers to Discharge  Current Status/Progress Possible Resolutions Date Resolved   [place] home environment;Home environment access/layout;Lack of/limited family  support 20 steps to enter home. Children work, plan to adjust schedule to provide care on target          Discharge Planning/Teaching Needs:  Goal to discharge home with children to provide care  Will schedule if needed   Team Discussion:  Continue abx for UTI. Left UE movement is delayed and deliberate; indeterminate cause for unusual movement. Progress also affected by fatigue. Recommend discharge home with daughter  Revisions to Treatment Plan:  SLP upgraded goals to MOD I, PT and OT maintain supervision level goals    Medical Summary Current Status: Complaining of knots at heparin site injection but no evidence of hematoma.  Hemodialysis ongoing Weekly Focus/Goal: Coordinating hemodialysis with therapy schedule as well as discharge  Barriers to Discharge: Hemodialysis   Possible Resolutions to Barriers: Nephrology consult   Continued Need for Acute Rehabilitation Level of Care: The patient requires daily medical management by a physician with specialized training in physical medicine and rehabilitation for the following reasons: Direction of a multidisciplinary physical rehabilitation program to maximize functional independence : Yes Medical management of patient stability for increased activity during participation in an intensive rehabilitation regime.: Yes   I attest that I was present, lead the team conference, and concur with the assessment and plan of the team.   Dorien Chihuahua B 09/29/2019, 2:02 PM

## 2019-09-29 NOTE — Progress Notes (Signed)
Subjective:  No cos  For hd later today   Objective Vital signs in last 24 hours: Vitals:   09/28/19 1352 09/28/19 2029 09/29/19 0319 09/29/19 0808  BP: (!) 106/54 136/64 (!) 115/54 (!) 111/59  Pulse: 86 87 81 80  Resp: 16     Temp: 98.3 F (36.8 C) 98.3 F (36.8 C) 99.4 F (37.4 C)   TempSrc:   Oral   SpO2:  98% 98%   Weight:      Height:       Weight change:   Physical Exam General:alert,Chronically ill appearingobese female,NAD Heart:RRR, no murmur Lungs:CTAB Abdomen:soft, non-tender Extremities:no LE edema Dialysis Access:LUE AVF + bruit  Dialysis Orders: [facility] 4h 400/800 105.5kg 3K/2.25 bath UFP #3 LUE AVF Hep 2000 - Hectoral 51mcg Iv q HD - Mircera 241mcg IV q 2 weeks (last 5/10) - Venofer 100 x 10 ordered --> for Hgb 8.5, tsat 7% on 5/10.  Problem/Plan: 1. Acute L cerebellar + subcentimeter deep white matter R VZD:GLOVFIEPPI L sided weakness and facial droop, improving. Echo normal. CTA without carotid or posterior circulation stenoses. Prior CVA on imaging that she was unaware of.Neuro followed, plan is for 30d event monitor to r/o A-fib.///transitioned to CIR  2. ESRD:Continue HD per MWF schedule-hd today 3. Hypertension/volume:PermissiveHTN w/ acute CVA.BP now controlled/low sided. Below EDW.No edema on exam.HR improved now that back on metoprolol. 4. Anemia(ESRD + IDA):Hgb 10.2> 9.9. Next ESA to get   200 aranesp Hd 5/26//,On high dose ESA as outpatient. 5. Metabolic bone disease:Ca ok,Phos better -resumedhome binder Lorin Picket). 6. T2DM- per admit 7. Hyperlipidemia: Prev on statin - off for unknown time, resumed here. 8. Leukocytosis:WBC16.9risingand now down to 12>11.3  , asymptomatic UTI = Staph .>100,000 with Admit team rx Amox 9. Gastroparesis: Chronic issue.  Ernest Haber, PA-C [facility] Beeper [phone] 09/29/2019,1:10 PM  LOS: 6 days   Labs: Basic Metabolic Panel: Recent Labs   Lab 09/23/19 0244 09/26/19 0231 09/27/19 1257  NA 135 133* 127*  K 4.7 4.2 3.6  CL 93* 92* 87*  CO2 26 27 23   GLUCOSE 137* 117* 203*  BUN 33* 32* 56*  CREATININE 7.33* 7.96* 11.17*  CALCIUM 9.8 9.8 9.4  PHOS 5.7* 5.1* 4.0   Liver Function Tests: Recent Labs  Lab 09/23/19 0244 09/26/19 0231 09/27/19 1257  ALBUMIN 3.8 3.4* 3.5   No results for input(s): LIPASE, AMYLASE in the last 168 hours. No results for input(s): AMMONIA in the last 168 hours. CBC: Recent Labs  Lab 09/23/19 0244 09/26/19 0231 09/27/19 1257  WBC 17.2* 12.2* 11.3*  NEUTROABS  --  8.4*  --   HGB 11.0* 10.2* 9.9*  HCT 39.0 35.3* 33.4*  MCV 80.7 80.0 79.7*  PLT 551* 466* 444*   Cardiac Enzymes: No results for input(s): CKTOTAL, CKMB, CKMBINDEX, TROPONINI in the last 168 hours. CBG: No results for input(s): GLUCAP in the last 168 hours.  Studies/Results: No results found. Medications:  . amoxicillin  250 mg Oral Daily  . aspirin EC  81 mg Oral Daily  . atorvastatin  40 mg Oral Daily  . clopidogrel  75 mg Oral Daily  . darbepoetin (ARANESP) injection - DIALYSIS  200 mcg Intravenous Q Wed-HD  . feeding supplement (NEPRO CARB STEADY)  237 mL Oral TID BM  . ferric citrate  420 mg Oral TID WC  . heparin  5,000 Units Subcutaneous Q8H  . metoprolol tartrate  50 mg Oral BID  . multivitamin  1 tablet Oral QHS  .  ondansetron  4 mg Oral Q12H

## 2019-09-29 NOTE — Progress Notes (Signed)
Physical Therapy Session Note  Patient Details  Name: [name] MRN: [id number] Date of Birth: [birth year]  Today's Date: 09/29/2019 PT Individual Time: 5465-6812 PT Individual Time Calculation (min): 55 min    Short Term Goals: Week 1:  PT Short Term Goal 1 (Week 1): Pt will perform bed mobility with CGA. PT Short Term Goal 2 (Week 1): Pt will perform bed to chair transfer with CGA. PT Short Term Goal 3 (Week 1): Pt will ambulate 89' with CGA. PT Short Term Goal 4 (Week 1): Pt will perform 10 steps with CGA and BHRs.  Skilled Therapeutic Interventions/Progress Updates:    Pt supine in bed upon PT arrival, agreeable to therapy tx and denies pain at rest. Pt reports she does not want to leave the room since she has not had occupational therapy, "you don't understand, I am not leaving the room." Therapist explained that we really need to work on stair negotiation this session but pt continues to decline. Pt agreeable to work on exercises/ambulation within the room. Pt transferred to sitting EOB with supervision. Sit<>stands throughout this session with RW and supervision. Pt ambulated 4 x 20 ft this session in the room using RW and with min assist, cues for upright posture, RW management and foot clearance. Pt worked on LE strengthening this session to perform 2 x 10 step ups on 4 inch step using B UE support on RW, min assist. For LE strengthening pt performed 2 x 10 sit<>stands this session from the EOB without UE support, cues for techniques. Pt ambulated x 10 ft to w/c with RW and CGA, left in w/c with needs in reach and chair alarm set.   Therapy Documentation Precautions:  Precautions Precautions: Fall Precaution Comments: L hemiparesis/watch BP Restrictions Weight Bearing Restrictions: No    Therapy/Group: Individual Therapy  Netta Corrigan, PT, DPT, CSRS 09/29/2019, 7:43 AM

## 2019-09-29 NOTE — Progress Notes (Addendum)
[place] PHYSICAL MEDICINE & REHABILITATION PROGRESS NOTE   Subjective/Complaints:  Pt working on filling med trays with SLP C/o multiple shot (heparin)   Review of systems -  Pt denies SOB, abd pain, CP, N/V/C/D, and vision changes   Objective:   No results found. Recent Labs    09/27/19 1257  WBC 11.3*  HGB 9.9*  HCT 33.4*  PLT 444*   Recent Labs    09/27/19 1257  NA 127*  K 3.6  CL 87*  CO2 23  GLUCOSE 203*  BUN 56*  CREATININE 11.17*  CALCIUM 9.4    Intake/Output Summary (Last 24 hours) at 09/29/2019 0753 Last data filed at 09/28/2019 1815 Gross per 24 hour  Intake 118 ml  Output --  Net 118 ml     Physical Exam: Vital Signs Blood pressure (!) 115/54, pulse 81, temperature 99.4 F (37.4 C), temperature source Oral, resp. rate 16, height 5' 9.02" (1.753 m), weight 103.2 kg, SpO2 98 %.    General: No acute distress Mood and affect are appropriate Heart: Regular rate and rhythm no rubs murmurs or extra sounds Lungs: Clear to auscultation, breathing unlabored, no rales or wheezes Abdomen: Positive bowel sounds, soft nontender to palpation, nondistended Extremities: No clubbing, cyanosis, or edema Skin: No evidence of breakdown, no evidence of rash Neurologic:, motor strength is 5/5 in Right 3-/5 Left  deltoid, bicep, tricep, grip, 4- hip flexor, knee extensors, ankle dorsiflexor and plantar flexor Sensory exam normal sensation to light touch and proprioception in bilateral upper and lower extremities Good fine motor No dysmetria with L FNF but moving very slowly  Musculoskeletal: No pain with upper limb or lower limb active assistive range of motion   Assessment/Plan: 1. 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  Physiatrist is  providing close team supervision and 24 hour management of active medical problems listed below.  Physiatrist and rehab team continue to assess barriers to discharge/monitor patient progress toward functional and medical goals  Care Tool:  Bathing    Body parts bathed by patient: Left arm, Chest, Abdomen, Front perineal area, Right upper leg, Left upper leg, Face, Right lower leg, Buttocks, Left lower leg   Body parts bathed by helper: Right arm     Bathing assist Assist Level: Minimal Assistance - Patient > 75%     Upper Body Dressing/Undressing Upper body dressing   What is the patient wearing?: Pull over shirt    Upper body assist Assist Level: Moderate Assistance - Patient 50 - 74%    Lower Body Dressing/Undressing Lower body dressing      What is the patient wearing?: Pants     Lower body assist Assist for lower body dressing: Minimal Assistance - Patient > 75%     Toileting Toileting Toileting Activity did not occur (Clothing management and hygiene only): N/A (no void or bm)  Toileting assist       Transfers Chair/bed transfer  Transfers assist     Chair/bed transfer assist level: Minimal Assistance - Patient > 75%     Locomotion Ambulation   Ambulation assist      Assist level: Minimal Assistance - Patient > 75% Assistive device: Walker-rolling Max distance: 48'   Walk 10 feet activity   Assist     Assist level: Minimal Assistance - Patient > 75% Assistive  device: No Device   Walk 50 feet activity   Assist Walk 50 feet with 2 turns activity did not occur: Safety/medical concerns  Assist level: Minimal Assistance - Patient > 75% Assistive device: Walker-rolling    Walk 150 feet activity   Assist Walk 150 feet activity did not occur: Safety/medical concerns         Walk 10 feet on uneven surface  activity   Assist     Assist level: Moderate Assistance - Patient - 50 - 74% Assistive device: Hand held assist, Other  (comment)(R hand rail)   Wheelchair     Assist Will patient use wheelchair at discharge?: No             Wheelchair 50 feet with 2 turns activity    Assist            Wheelchair 150 feet activity     Assist          Blood pressure (!) 115/54, pulse 81, temperature 99.4 F (37.4 C), temperature source Oral, resp. rate 16, height 5' 9.02" (1.753 m), weight 103.2 kg, SpO2 98 %.  Medical Problem List and Plan: 1.Left-sided weakness and dysarthriasecondary to 2 small infarcts left cerebellar deep nucleusand posterior to the atrium of the right lateral ventricle most likely separate small vessel disease. Plan 30-day cardiac event monitor as outpatient Unusual presentation- bradykinesia without tremor Cerebellar infarct not causing dysmetria  -patient may shower -ELOS/Goals: 2-2.5 weeks; goals supervision to mod I Team conference today please see physician documentation under team conference tab, met with team  to discuss problems,progress, and goals. Formulized individual treatment plan based on medical history, underlying problem and comorbidities.  2. Antithrombotics: -DVT/anticoagulation:Subcutaneous heparin- no lovenox due to HD, may d/c once amb 100' -antiplatelet therapy: Aspirin and Plavix x3 weeks then Plavix alone 3. Pain Management:Tylenol as needed 4. Mood:Provide emotional support -antipsychotic agents: N/A 5. Neuropsych: This patientiscapable of making decisions on herown behalf. 6. Skin/Wound Care:Routine skin checks 7. Fluids/Electrolytes/Nutrition:Routine in and outs with follow-up chemistries- Hyponatremia, nephro to manage with HD  8. End-stage renal disease. Continue hemodialysis as directed by Nephro 9. Hyperlipidemia. Lipitor 10. Diet controlled diabetes mellitus. Hemoglobin A1c 6.7. Blood sugar checks currently discontinued  11. Permissive hypertension/tachycardia.  Lopressor initiated 53m twice daily 09/22/2019. Monitor with increased mobility and titrate as required.   Vitals:   09/28/19 2029 09/29/19 0319  BP: 136/64 (!) 115/54  Pulse: 87 81  Resp:    Temp: 98.3 F (36.8 C) 99.4 F (37.4 C)  SpO2: 98% 98%  5/26- controlled   12/ UTI,   5/22- Last WBC was 17.2- will recheck in AM- no urine Cx was able to be gotten by nursing- will con't to try - on no ABX- will monitor  5/23- Nursing got Cx- is staph capitus >100k- WBC is down to 12.2 from >17k- afebrile; pt feels good- sens to oxacillin, 7d course amoxil  , low grade temp this am , asymptomatic    LOS: 6 days A FACE TO FACE EVALUATION WAS PERFORMED  ACharlett Blake5/26/2021, 7:53 AM

## 2019-09-29 NOTE — Progress Notes (Signed)
Nutrition Follow-up  RD working remotely.  DOCUMENTATION CODES:   Obesity unspecified  INTERVENTION:   - Continue Nepro Shake po TID, each supplement provides 425 kcal and 19 grams protein  - Continue renal MVI daily  NUTRITION DIAGNOSIS:   Increased nutrient needs related to chronic illness as evidenced by estimated needs.  Ongoing, being addressed via supplements  GOAL:   Patient will meet greater than or equal to 90% of their needs  Progressing  MONITOR:   PO intake, Supplement acceptance, Labs, Weight trends, I & O's  REASON FOR ASSESSMENT:   Malnutrition Screening Tool    ASSESSMENT:   59 year old female with PMH of diet-controlled diabetes mellitus, HTN, and ESRD on HD. Presented 09/18/19 with acute onset of left-sided weakness, facial droop, and slurred speech. MRI showed a 1 cm acute infarct in the left cerebellum. Subcentimeter acute infarct in the deep white matter just posterior to the atrium of the right lateral ventricle. Admitted to CIR on 5/20.  Last HD on 09/27/19 with 1500 ml net UF. Plan for HD again today.  EDW: 105.5 kg  RD was unable to reach pt via phone call to room. Pt accepting most Nepro Shakes per [facility] documentation. Meal completion average has improved. Weigh up 3 lbs overall since admission. Per RN edema assessment, pt with non-pitting edema to LLE. Will continue with current plan of care.  Meal Completion: 5-100% x last 8 recorded meals (averaging 47%)  Medications reviewed and include: Nepro TID, ferric citrate, rena-vit, Zofran q 12 hours  Labs reviewed: sodium 127, hemoglobin 9.9  Diet Order:   Diet Order            Diet renal/carb modified with fluid restriction Diet-HS Snack? Nothing; Fluid restriction: 1200 mL Fluid; Room service appropriate? Yes; Fluid consistency: Thin  Diet effective now              EDUCATION NEEDS:   Education needs have been addressed  Skin:  Skin Assessment: Reviewed RN Assessment  Last BM:   09/27/19  Height:   Ht Readings from Last 1 Encounters:  09/23/19 5' 9.02" (1.753 m)    Weight:   Wt Readings from Last 1 Encounters:  09/27/19 103.2 kg    Ideal Body Weight:  65.9 kg  BMI:  Body mass index is 33.58 kg/m.  Estimated Nutritional Needs:   Kcal:  2100-2300  Protein:  110-130 grams  Fluid:  1000 ml + UOP    Gaynell Face, MS, RD, LDN Inpatient Clinical Dietitian Pager: [phone] Weekend/After Hours: [phone]

## 2019-09-29 NOTE — Progress Notes (Signed)
Occupational Therapy Session Note  Patient Details  Name: [name] MRN: [id number] Date of Birth: 27-Nov-1960  Today's Date: 09/29/2019 OT Individual Time: 1103-1200 OT Individual Time Calculation (min): 57 min    Short Term Goals: Week 1:  OT Short Term Goal 2 (Week 1): Patient will don UB clothing with supervision A. OT Short Term Goal 3 (Week 1): Patient will don LB clothing with Min A in sitting/standing with LRAD. OT Short Term Goal 4 (Week 1): Patient will complete 3/3 parts of toileting with Min A.  Skilled Therapeutic Interventions/Progress Updates:  Patient met seated in wc in agreement with OT treatment session with focus on self-care re-education, NMR and household mobility as detailed below. Patient reporting 0/10 pain at rest and slight discomfort in L shoulder with activity. All functional transfers from wc > commode > TTB in walk-in shower with supervision A and Min A from low commode and use of RW. Patient completed UB/LB bathing with Min A overall  in sitting/standing and cues for use of LUE. Seated EOB, patient completed UB dressing with supervision A. LB dressing in sitting/standing with assistance for threading LLE and CGA for steadying in standing to hike pants over hips. Session concluded with patient in chair position in bed with call bell within reach, bed alarm activated, and all needs met.   Therapy Documentation Precautions:  Precautions Precautions: Fall Precaution Comments: L hemiparesis/watch BP Restrictions Weight Bearing Restrictions: No General:    Therapy/Group: Individual Therapy  Anne-Marie Genson R Howerton-Davis 09/29/2019, 8:00 AM

## 2019-09-29 NOTE — Progress Notes (Signed)
Nurse Tech reported to RN that pt had emesis episode at 2230. Pt was about to ambulate to bathroom with tech prior to episode. The appearance was undigested food.   Pt did not have supper prior to dialysis. Pt had a chicken pot pie that the pt's daughter brought to her. Pt had scheduled Zofran which was administered at 2106.   Pt went to the bathroom and a medium type 3 green BM. Pt stated that she felt better after BM, no longer nauseas. Pt placed back into bed, call bell w/in reach, bed alarm on, and emesis bag on table.

## 2019-09-29 NOTE — Progress Notes (Signed)
Pt arrived back onto unit at 2037 from HD. VItals were taken and w/in normal parameters, no c/o pain. Pt did not have supper prior to dialysis. Pt's daughter brought her chicken pot pie for dinner.

## 2019-09-29 NOTE — Progress Notes (Signed)
Patient ID: [name], female   DOB: [birth year], 59 y.o.   MRN: [id number]  Confirmed patient will be staying with daughter

## 2019-09-29 NOTE — Progress Notes (Addendum)
Team Conference Report to Patient/Family  Team Conference discussion was reviewed with the patient and caregiver, including goals, any changes in plan of care and target discharge date.  Patient and caregiver express understanding and are in agreement.  The patient has a target discharge date of 10/07/19. Confirmed patient will be staying with daughter  Dyanne Iha 09/29/2019, 3:10 PM Patient ID: [name], female   DOB: 11/09/1960, 59 y.o.   MRN: [id number]

## 2019-09-30 ENCOUNTER — Inpatient Hospital Stay (HOSPITAL_COMMUNITY): Payer: BC Managed Care – PPO | Admitting: Physical Therapy

## 2019-09-30 ENCOUNTER — Inpatient Hospital Stay (HOSPITAL_COMMUNITY): Payer: BC Managed Care – PPO | Admitting: Speech Pathology

## 2019-09-30 ENCOUNTER — Inpatient Hospital Stay (HOSPITAL_COMMUNITY): Payer: BC Managed Care – PPO

## 2019-09-30 ENCOUNTER — Inpatient Hospital Stay (HOSPITAL_COMMUNITY): Payer: BC Managed Care – PPO | Admitting: Occupational Therapy

## 2019-09-30 NOTE — Progress Notes (Signed)
Occupational Therapy Session Note  Patient Details  Name: [name] MRN: [id number] Date of Birth: Dec 03, 1960  Today's Date: 09/30/2019 OT Individual Time: 3710-6269 OT Individual Time Calculation (min): 43 min    Short Term Goals: Week 1:  OT Short Term Goal 2 (Week 1): Patient will don UB clothing with supervision A. OT Short Term Goal 3 (Week 1): Patient will don LB clothing with Min A in sitting/standing with LRAD. OT Short Term Goal 4 (Week 1): Patient will complete 3/3 parts of toileting with Min A.  Skilled Therapeutic Interventions/Progress Updates:    Upon entering the room, pt seated in wheelchair and calling for assistance to bathroom. Pt reports constipation and stomach pain. Pt ambulating with RW into bathroom with min guard 10'. Pt performing toilet transfer, hygiene, and clothing management with min guard overall. Pt having small BM but sitting on commode for ~25 minutes. Pt standing at sink for hand hygiene with CGA and returning to wheelchair. OT taking BP with results of 124/62. Pt engaged in L UE finger isolation exercises with increased difficulty with 4th and 5th digits. Pt requesting to return to bed at end of session secondary to fatigue. Call bell and all needed items within reach upon exiting the room.   Therapy Documentation Precautions:  Precautions Precautions: Fall Precaution Comments: L hemiparesis/watch BP Restrictions Weight Bearing Restrictions: No Vital Signs: Therapy Vitals Temp: 98.5 F (36.9 C) Pulse Rate: 91 Resp: 20 BP: 136/71 Patient Position (if appropriate): Sitting Oxygen Therapy SpO2: 100 % O2 Device: Room Air ADL: ADL Upper Body Bathing: Minimal assistance Where Assessed-Upper Body Bathing: Shower Lower Body Bathing: Moderate assistance Where Assessed-Lower Body Bathing: Shower Upper Body Dressing: Moderate assistance Where Assessed-Upper Body Dressing: Edge of bed Lower Body Dressing: Moderate assistance Where Assessed-Lower  Body Dressing: Edge of bed Social research officer, government: Moderate cueing, Minimal assistance Social research officer, government Method: Stand pivot Youth worker: Transfer tub bench   Therapy/Group: Individual Therapy  Gypsy Decant 09/30/2019, 4:03 PM

## 2019-09-30 NOTE — Progress Notes (Signed)
Occupational Therapy Session Note  Patient Details  Name: [name] MRN: [id number] Date of Birth: 1960/06/24  Today's Date: 09/30/2019 OT Individual Time: 0801-0900 OT Individual Time Calculation (min): 59 min    Short Term Goals: Week 1:  OT Short Term Goal 2 (Week 1): Patient will don UB clothing with supervision A. OT Short Term Goal 3 (Week 1): Patient will don LB clothing with Min A in sitting/standing with LRAD. OT Short Term Goal 4 (Week 1): Patient will complete 3/3 parts of toileting with Min A.  Skilled Therapeutic Interventions/Progress Updates:  Patient met lying supine in bed in agreement with OT treatment with focus on self-care re-education, L NMR, and activity tolerance in prep for safe d/c home as detailed below. 0/10 pain at rest and with activity although patient notes fatigue throughout treatment session requiring frequent seated rest breaks. Supine to EOB with supervision A and increased time. Functional mobility to sink in prep for bathing with use of RW. Patient demonstrates increased use of LUE with less cueing this date. LB bathing/dressing in sitting/standing with supervision A and cueing for hand placement on sink surface for increased stability. Functional mobility from room toward nurses station with RW and supervision to [place]. NMR re-education with focus on L FMC. Patient would benefit from printed HEP to increase carryover. OT provided education on self-ROM using non-affected UE to assist hemiparetic LUE. Printout to be provided at next session. Session concluded with patient seated in wc with call bell within reach, belt alarm activated, and all needs met.     Therapy Documentation Precautions:  Precautions Precautions: Fall Precaution Comments: L hemiparesis/watch BP Restrictions Weight Bearing Restrictions: No General:    Therapy/Group: Individual Therapy  Cesare Sumlin R Howerton-Davis 09/30/2019, 7:39 AM

## 2019-09-30 NOTE — Progress Notes (Signed)
[facility] PHYSICAL MEDICINE & REHABILITATION PROGRESS NOTE   Subjective/Complaints:  amb with OT Supervision using walker  Discussed fatigue Pt was working in housekeeping PTA  Review of systems -  Pt denies SOB, abd pain, CP, N/V/C/D, and vision changes   Objective:   No results found. Recent Labs    09/27/19 1257 09/29/19 1530  WBC 11.3* 8.6  HGB 9.9* 9.3*  HCT 33.4* 30.9*  PLT 444* 396   Recent Labs    09/27/19 1257 09/29/19 1530  NA 127* 128*  K 3.6 3.6  CL 87* 90*  CO2 23 25  GLUCOSE 203* 170*  BUN 56* 41*  CREATININE 11.17* 9.96*  CALCIUM 9.4 9.4    Intake/Output Summary (Last 24 hours) at 09/30/2019 0851 Last data filed at 09/29/2019 1930 Gross per 24 hour  Intake 120 ml  Output 1950 ml  Net -1830 ml     Physical Exam: Vital Signs Blood pressure 97/64, pulse 86, temperature 97.8 F (36.6 C), temperature source Oral, resp. rate 17, height 5' 9.02" (1.753 m), weight 102.5 kg, SpO2 100 %.  General: No acute distress Mood and affect are appropriate Heart: Regular rate and rhythm no rubs murmurs or extra sounds Lungs: Clear to auscultation, breathing unlabored, no rales or wheezes Abdomen: Positive bowel sounds, soft nontender to palpation, nondistended Extremities: No clubbing, cyanosis, or edema Skin: No evidence of breakdown, no evidence of rash   Neurologic:, motor strength is 5/5 in Right 4-/5 Left  deltoid, bicep, tricep, grip, 4- hip flexor, knee extensors, ankle dorsiflexor and plantar flexor Sensory exam normal sensation to light touch and proprioception in bilateral upper and lower extremities Good fine motor No dysmetria with L FNF but moving very slowly  Musculoskeletal: No pain with upper limb or lower limb active assistive range of motion   Assessment/Plan: 1. 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  Physiatrist is providing close team supervision and 24 hour management of active medical problems listed below.  Physiatrist and rehab team continue to assess barriers to discharge/monitor patient progress toward functional and medical goals  Care Tool:  Bathing    Body parts bathed by patient: Left arm, Chest, Abdomen, Front perineal area, Right upper leg, Left upper leg, Face, Right lower leg, Buttocks, Left lower leg, Right arm   Body parts bathed by helper: Right arm     Bathing assist Assist Level: Minimal Assistance - Patient > 75%     Upper Body Dressing/Undressing Upper body dressing   What is the patient wearing?: Pull over shirt    Upper body assist Assist Level: Supervision/Verbal cueing    Lower Body Dressing/Undressing Lower body dressing      What is the patient wearing?: Pants     Lower body assist Assist for lower body dressing: Minimal Assistance - Patient > 75%     Toileting Toileting Toileting Activity did not occur (Clothing management and hygiene only): N/A (no void or bm)  Toileting assist Assist for toileting: Supervision/Verbal cueing     Transfers Chair/bed transfer  Transfers assist     Chair/bed transfer assist level: Contact Guard/Touching assist     Locomotion Ambulation   Ambulation assist      Assist level: Minimal Assistance - Patient > 75% Assistive device: Walker-rolling Max distance: 20 ft   Walk 10 feet activity  Assist     Assist level: Minimal Assistance - Patient > 75% Assistive device: No Device   Walk 50 feet activity   Assist Walk 50 feet with 2 turns activity did not occur: Safety/medical concerns  Assist level: Minimal Assistance - Patient > 75% Assistive device: Walker-rolling    Walk 150 feet activity   Assist Walk 150 feet activity did not occur: Safety/medical concerns         Walk 10 feet on  uneven surface  activity   Assist     Assist level: Moderate Assistance - Patient - 50 - 74% Assistive device: Hand held assist, Other (comment)(R hand rail)   Wheelchair     Assist Will patient use wheelchair at discharge?: No             Wheelchair 50 feet with 2 turns activity    Assist            Wheelchair 150 feet activity     Assist          Blood pressure 97/64, pulse 86, temperature 97.8 F (36.6 C), temperature source Oral, resp. rate 17, height 5' 9.02" (1.753 m), weight 102.5 kg, SpO2 100 %.  Medical Problem List and Plan: 1.Left-sided weakness and dysarthriasecondary to 2 small infarcts left cerebellar deep nucleusand posterior to the atrium of the right lateral ventricle most likely separate small vessel disease. Plan 30-day cardiac event monitor as outpatient Unusual presentation- bradykinesia without tremor Cerebellar infarct not causing dysmetria  -patient may shower -ELOS/Goals: 2-2.5 weeks; goals supervision to mod I   2. Antithrombotics: -DVT/anticoagulation:Subcutaneous heparin- no lovenox due to HD, may d/c once amb 100' -antiplatelet therapy: Aspirin and Plavix x3 weeks then Plavix alone 3. Pain Management:Tylenol as needed 4. Mood:Provide emotional support -antipsychotic agents: N/A 5. Neuropsych: This patientiscapable of making decisions on herown behalf. 6. Skin/Wound Care:Routine skin checks 7. Fluids/Electrolytes/Nutrition:Routine in and outs with follow-up chemistries- Hyponatremia, nephro to manage with HD  8. End-stage renal disease. Continue hemodialysis as directed by Nephro 9. Hyperlipidemia. Lipitor 10. Diet controlled diabetes mellitus. Hemoglobin A1c 6.7. Blood sugar checks currently discontinued  11. Permissive hypertension/tachycardia. Lopressor initiated 50mg  twice daily 09/22/2019. Monitor with increased mobility and titrate as  required.   Vitals:   09/29/19 2039 09/30/19 0619  BP: (!) 112/57 97/64  Pulse: 89 86  Resp:  17  Temp: 98.3 F (36.8 C) 97.8 F (36.6 C)  SpO2: 100% 100%  5/27- controlled   12/ UTI,   5/22- Last WBC was 17.2- will recheck in AM- no urine Cx was able to be gotten by nursing- will con't to try - on no ABX- will monitor  5/23- Nursing got Cx- is staph capitus >100k- WBC is down to 12.2 from >17k- afebrile; pt feels good- sens to oxacillin, 7d course amoxil  , low grade temp this am , asymptomatic    LOS: 7 days A FACE TO FACE EVALUATION WAS PERFORMED  Charlett Blake 09/30/2019, 8:51 AM

## 2019-09-30 NOTE — Progress Notes (Signed)
Subjective:  No co, said tolerated hd last pm   Objective Vital signs in last 24 hours: Vitals:   09/29/19 1930 09/29/19 2039 09/30/19 0619 09/30/19 0905  BP: (!) 124/47 (!) 112/57 97/64 (!) 95/59  Pulse: 83 89 86 91  Resp: 18  17   Temp: (!) 97.5 F (36.4 C) 98.3 F (36.8 C) 97.8 F (36.6 C)   TempSrc: Oral  Oral   SpO2: 99% 100% 100%   Weight: 102.5 kg     Height:       Weight change:   Physical Exam General:alert,NAD Heart:RRR, no murmur Lungs:CTAB Abdomen: bs +,soft, non-tender, ND Extremities:no LE edema Dialysis Access:LUE AVF + bruit  Dialysis Orders: [facility]WF [facility] 4h 400/800 105.5kg 3K/2.25 bath UFP #3 LUE AVF Hep 2000 - Hectoral 32mcg Iv q HD - Mircera 269mcg IV q 2 weeks (last 5/10) - Venofer 100 x 10 ordered --> for Hgb 8.5, tsat 7% on 5/10.  Problem/Plan: 1. Acute L cerebellar + subcentimeter deep white matter R ZHG:DJMEQASTMH L sided weakness and facial droop, improving. Echo normal. CTA without carotid or posterior circulation stenoses. Prior CVA on imaging that she was unaware of.Neuro followed, plan is for 30d event monitor to r/o A-fib.///transitioned to CIR  2. ESRD:Continue HD per MWF schedule- k 3.6 yest  3. Hypertension/volume:PermissiveHTN w/ acute CVA.BP now controlled/low sided. Below EDW.  on metoprolol. 4. Anemia(ESRD + IDA):Hgb10.2> 9.9>9.3 .  200 aranespHd 5/26//,On high dose ESA as outpatient. 5. Metabolic bone disease:Ca ok,Phos better -resumedhome binder Lorin Picket). 6. T2DM- per admit 7. Hyperlipidemia: Prev on statin - off for unknown time, resumed here. 8. Leukocytosis:WBC16.9risingand now down to 12>11.3>8.3 , asymptomatic UTI = Staph .>100,000 with Admit team rx Amox 9. Gastroparesis: Chronic issue.no issue today  Ernest Haber, PA-C [facility] Beeper [phone] 09/30/2019,11:15 AM  LOS: 7 days   Labs: Basic Metabolic Panel: Recent Labs  Lab 09/26/19 0231 09/27/19 1257  09/29/19 1530  NA 133* 127* 128*  K 4.2 3.6 3.6  CL 92* 87* 90*  CO2 27 23 25   GLUCOSE 117* 203* 170*  BUN 32* 56* 41*  CREATININE 7.96* 11.17* 9.96*  CALCIUM 9.8 9.4 9.4  PHOS 5.1* 4.0 3.9   Liver Function Tests: Recent Labs  Lab 09/26/19 0231 09/27/19 1257 09/29/19 1530  ALBUMIN 3.4* 3.5 3.3*   No results for input(s): LIPASE, AMYLASE in the last 168 hours. No results for input(s): AMMONIA in the last 168 hours. CBC: Recent Labs  Lab 09/26/19 0231 09/27/19 1257 09/29/19 1530  WBC 12.2* 11.3* 8.6  NEUTROABS 8.4*  --   --   HGB 10.2* 9.9* 9.3*  HCT 35.3* 33.4* 30.9*  MCV 80.0 79.7* 79.2*  PLT 466* 444* 396   Cardiac Enzymes: No results for input(s): CKTOTAL, CKMB, CKMBINDEX, TROPONINI in the last 168 hours. CBG: No results for input(s): GLUCAP in the last 168 hours.  Studies/Results: No results found. Medications:  . amoxicillin  250 mg Oral Daily  . aspirin EC  81 mg Oral Daily  . atorvastatin  40 mg Oral Daily  . clopidogrel  75 mg Oral Daily  . darbepoetin (ARANESP) injection - DIALYSIS  200 mcg Intravenous Q Wed-HD  . feeding supplement (NEPRO CARB STEADY)  237 mL Oral TID BM  . ferric citrate  420 mg Oral TID WC  . heparin  5,000 Units Subcutaneous Q8H  . metoprolol tartrate  50 mg Oral BID  . multivitamin  1 tablet Oral QHS  . ondansetron  4 mg Oral Q12H

## 2019-09-30 NOTE — Plan of Care (Signed)
  Problem: Consults Goal: RH STROKE PATIENT EDUCATION Description: See Patient Education module for education specifics  Outcome: Progressing   Problem: RH BOWEL ELIMINATION Goal: RH STG MANAGE BOWEL WITH ASSISTANCE Description: STG Manage Bowel with Mod I Assistance. Outcome: Progressing   Problem: RH BLADDER ELIMINATION Goal: RH STG MANAGE BLADDER WITH ASSISTANCE Description: STG Manage Bladder With Mod I Assistance Outcome: Progressing   Problem: RH SAFETY Goal: RH STG ADHERE TO SAFETY PRECAUTIONS W/ASSISTANCE/DEVICE Description: STG Adhere to Safety Precautions With supervision Assistance/Device. Outcome: Progressing

## 2019-09-30 NOTE — Progress Notes (Signed)
Physical Therapy Session Note  Patient Details  Name: [name] MRN: [id number] Date of Birth: [birth year]  Today's Date: 09/30/2019 PT Individual Time: 1115-1200 PT Individual Time Calculation (min): 45 min   Short Term Goals: Week 1:  PT Short Term Goal 1 (Week 1): Pt will perform bed mobility with CGA. PT Short Term Goal 2 (Week 1): Pt will perform bed to chair transfer with CGA. PT Short Term Goal 3 (Week 1): Pt will ambulate 69' with CGA. PT Short Term Goal 4 (Week 1): Pt will perform 10 steps with CGA and BHRs. Week 2:    Week 3:     Skilled Therapeutic Interventions/Progress Updates:    PAIN denies pain except warns therapist not to "grab LUE" bc it will cuase pain  Pt initially supine and agreeable to treatment.  BP supine 103/59, HR 81 Supine to sit w/supervision. Seated BP 98/51, HR 80, no c/os SPT bed to wc w/cga. Pt transported to gym for continued session  STS in hall and gait 23ft w/occasional use of handrail to R for balance, delayed advancement of LUE/decreased timing of reciprocal gait esp when not using rail., tendency toward decreased step length on L  Pt transported into gym, wc to mat w/min assist for balance.  Pt performed seated and standing ball toss into rebounder for bilat UE coordination + balance challenge.  Performs in sets of 10-12 reps, repeated x 4 w/seated rest between efforts.  Pt c/o dizzyness w/last effort following bending over to pick ball up from floor (min assist for balance).  Returned to sitting.  BP 113/55, HR 72  Gait repeated as above but w/minimal use of rail for balance, encouraged pt to challenge herself for benefits of error correction w/therapist assist for safety.  Pt ambulates 90% without rail w/decreased cadence, decreased coodination LLE, self correction of balance during task, min assist overall.  Pt transported to room at end of session.  Pt left oob in wc w/alarm belt set and needs in reach   Therapy  Documentation Precautions:  Precautions Precautions: Fall Precaution Comments: L hemiparesis/watch BP Restrictions Weight Bearing Restrictions: No    Therapy/Group: Individual Therapy  Callie Fielding, [place] 09/30/2019, 3:39 PM

## 2019-09-30 NOTE — Progress Notes (Signed)
Speech Language Pathology Daily Session Note  Patient Details  Name: [name] MRN: [id number] Date of Birth: May 13, 1960  Today's Date: 09/30/2019 SLP Individual Time: 1000-1045 SLP Individual Time Calculation (min): 45 min  Short Term Goals: Week 1: SLP Short Term Goal 1 (Week 1): Patient will utilize an increased vocal intensity at the sentence level to achieve 100% intelligibility with supervision verbal cues. SLP Short Term Goal 2 (Week 1): Patient will demonstrate functional problem solving for mildly complex tasks with Min verbal cues. SLP Short Term Goal 3 (Week 1): Patient will utilize memory compensatory strategies to recall new, daily information with Min verbal cues. SLP Short Term Goal 4 (Week 1): Patient will demonstrate emergent awrenes of errors during functional tasks with Min verbal cues. SLP Short Term Goal 5 (Week 1): Patient will demonstrate selective attention to functional tasks in a mildly distracting enviornment for ~30 minutes with Min verbal cues for redirection.  Skilled Therapeutic Interventions:  Pt was seen for skilled ST targeting cognition.  Pt reports of drop in BP (symptomatic) during PT session this morning.  Pt slow to arouse as she was sleeping upon therapist's arrival but BP was checked in sitting  (108/58).  No reports of lightheadedness and pt was willing to participate in therapy once awakened.  SLP facilitated the session with a novel scheduling task to address goals for problem solving and error awareness.  Pt needed min assist verbal cues for task organization as well as to plan and execute a problem solving strategy.  Pt was left in bed with bed alarm set and call bell within reach.  Continue per current plan of care.    Pain Pain Assessment Pain Scale: 0-10 Pain Score: 0-No pain Faces Pain Scale: No hurt  Therapy/Group: Individual Therapy  Azie Mcconahy, Selinda Orion 09/30/2019, 10:50 AM

## 2019-09-30 NOTE — Progress Notes (Signed)
Physical Therapy Session Note  Patient Details  Name: [name] MRN: [id number] Date of Birth: 11/14/1960  Today's Date: 09/30/2019 PT Individual Time: 7262-0355 PT Individual Time Calculation (min): 44 min   and  Today's Date: 09/30/2019 PT Missed Time: 16 Minutes Missed Time Reason: Patient fatigue;Other (Comment)(orthostatic hypotension)  Short Term Goals: Week 1:  PT Short Term Goal 1 (Week 1): Pt will perform bed mobility with CGA. PT Short Term Goal 2 (Week 1): Pt will perform bed to chair transfer with CGA. PT Short Term Goal 3 (Week 1): Pt will ambulate 63' with CGA. PT Short Term Goal 4 (Week 1): Pt will perform 10 steps with CGA and BHRs.  Skilled Therapeutic Interventions/Progress Updates:   Pt received sitting in w/c and agreeable to therapy session though reports fatigue from dialysis yesterday evening and OT session this AM. Transported to/from gym in w/c for time management and energy conservation. Sit<>stands using RW with CGA for steadying. Gait training 55ft using RW with CGA for steadying - demonstrates decreased L LE knee control during stance and decreased L LE hip/knee flexion during swing. Gait training 77ft, no AD but LHHA, x2 with min/light mod assist for balance - demonstrates R/L lateral trunk lean onto stance limb and decreased B LE step length (L LE more impaired) with shuffling steps and lack of foot clearance due to fear of falling - these slightly improve during 2nd walk but pt reports onset of lightheadedness during ambulation. Vitals assessed in sitting: BP 117/63 (MAP 80), HR 91bpm. Returned to standing and pt reports significant lightheadedness. Reassessed vitals in sitting: BP 102/62 (MAP 74), HR 90bpm then assessed in standing 85/59 (MAP 67) HR 92bpm (though pt had to sit prior to the dynamap completing the reading due to significant lightheadedness). Transported back to room. Stand pivot to EOB CGA using bedrail and sit>supine with min assist for B LE  management. Therapist donned  BLE TED hose max assist. Reassessed vitals in supine: BP 111/58 (MAP 75), HR 89bpm with pt reporting symptoms decreasing but still not dissipated - [name], RN notified. Pt left supine in bed with needs in reach and bed alarm on. Missed 16 minutes of skilled physical therapy.   Therapy Documentation Precautions:  Precautions Precautions: Fall Precaution Comments: L hemiparesis/watch BP Restrictions Weight Bearing Restrictions: No  Pain:  Denies pain during session.   Therapy/Group: Individual Therapy  Tawana Scale, PT, DPT 09/30/2019, 7:50 AM

## 2019-10-01 ENCOUNTER — Inpatient Hospital Stay (HOSPITAL_COMMUNITY): Payer: BC Managed Care – PPO | Admitting: Occupational Therapy

## 2019-10-01 ENCOUNTER — Inpatient Hospital Stay (HOSPITAL_COMMUNITY): Payer: BC Managed Care – PPO | Admitting: Speech Pathology

## 2019-10-01 ENCOUNTER — Inpatient Hospital Stay (HOSPITAL_COMMUNITY): Payer: BC Managed Care – PPO | Admitting: Physical Therapy

## 2019-10-01 MED ORDER — CHLORHEXIDINE GLUCONATE CLOTH 2 % EX PADS: 6.0000 | MEDICATED_PAD | Freq: Every day | CUTANEOUS | Status: DC

## 2019-10-01 NOTE — Progress Notes (Signed)
Speech Language Pathology Weekly Progress and Session Note  Patient Details  Name: [name] MRN: [id number] Date of Birth: 07/06/1960  Beginning of progress report period: Sep 24, 2019 End of progress report period: Oct 01, 2019  Today's Date: 10/01/2019 SLP Individual Time: 0733-0815 SLP Individual Time Calculation (min): 42 min  Short Term Goals: Week 1: SLP Short Term Goal 1 (Week 1): Patient will utilize an increased vocal intensity at the sentence level to achieve 100% intelligibility with supervision verbal cues. SLP Short Term Goal 1 - Progress (Week 1): Met SLP Short Term Goal 2 (Week 1): Patient will demonstrate functional problem solving for mildly complex tasks with Min verbal cues. SLP Short Term Goal 2 - Progress (Week 1): Met SLP Short Term Goal 3 (Week 1): Patient will utilize memory compensatory strategies to recall new, daily information with Min verbal cues. SLP Short Term Goal 3 - Progress (Week 1): Met SLP Short Term Goal 4 (Week 1): Patient will demonstrate emergent awrenes of errors during functional tasks with Min verbal cues. SLP Short Term Goal 4 - Progress (Week 1): Met SLP Short Term Goal 5 (Week 1): Patient will demonstrate selective attention to functional tasks in a mildly distracting enviornment for ~30 minutes with Min verbal cues for redirection. SLP Short Term Goal 5 - Progress (Week 1): Met    New Short Term Goals: Week 2: SLP Short Term Goal 1 (Week 2): STG=LTG due to remaining LOS  Weekly Progress Updates: Pt has made tremendous functional gains and met 5 out of 5 short term goals this reporting period. Pt is currently Supervision-Min assist for semi-complex to complex tasks due to cognitive impairments impacting her problem solving, higher level attention, and emergent awareness. Pt is now 100% intelligible in conversation and using appropriate vocal intensity Mod I. Pt has demonstrated greatly improved selective and alternating attention and  semi-complex problem solving. Pt education is ongoing; no family has been present for ST sessions to date. Pt would continue to benefit from skilled ST while inpatient in order to maximize functional independence and reduce burden of care prior to discharge. Anticipate that pt will need 24/7 supervision at discharge in addition to ST follow up at next level of care.     Intensity: Minumum of 1-2 x/day, 30 to 90 minutes Frequency: 3 to 5 out of 7 days Duration/Length of Stay: 10/07/19 Treatment/Interventions: Cognitive remediation/compensation;Internal/external aids;Speech/Language facilitation;Therapeutic Activities;Environmental controls;Cueing hierarchy;Functional tasks;Patient/family education   Daily Session  Skilled Therapeutic Interventions: Pt was seen for skilled ST targeting cognitive goals. SLP facilitated session with a complex deductive reasoning puzzle, during which pt required Min A verbal cues for complex problem solving. Pt utilized "chunking" memory strategy to recall number and general functions of current medications. She recalled name of yesterday's ST with Min A verbal cues for use of memory strategy and details of last targeted ST session with only Supervision A. Pt left laying in bed with alarm set and needs within reach. Continue per current plan of care.          Pain Pain Assessment Pain Scale: 0-10 Pain Score: 0-No pain  Therapy/Group: Individual Therapy  Erin E Smith 10/01/2019, 6:59 AM         

## 2019-10-01 NOTE — Progress Notes (Signed)
Patient hemodialysis treatment rescheduled for tomorrow Saturday, May 29 per Dr. Justin Mend.  [name], RN-primary notified of order change.

## 2019-10-01 NOTE — Progress Notes (Signed)
Patient ID: [name], female   DOB: January 13, 1961, 59 y.o.   MRN: [id number]  Sw followed up with family/patient for any questions/concerns we transition into weekend, no concerns

## 2019-10-01 NOTE — Progress Notes (Signed)
[facility] PHYSICAL MEDICINE & REHABILITATION PROGRESS NOTE   Subjective/Complaints:  C/o dizziness with standing orthostatic drop of ~18mHg lying to standing yesterday, HD for today    Review of systems -  Pt denies SOB, abd pain, CP, N/V/C/D, and vision changes   Objective:   No results found. Recent Labs    09/29/19 1530  WBC 8.6  HGB 9.3*  HCT 30.9*  PLT 396   Recent Labs    09/29/19 1530  NA 128*  K 3.6  CL 90*  CO2 25  GLUCOSE 170*  BUN 41*  CREATININE 9.96*  CALCIUM 9.4    Intake/Output Summary (Last 24 hours) at 10/01/2019 0903 Last data filed at 10/01/2019 0700 Gross per 24 hour  Intake 238 ml  Output --  Net 238 ml     Physical Exam: Vital Signs Blood pressure 118/68, pulse 79, temperature 98.1 F (36.7 C), resp. rate 18, height 5' 9.02" (1.753 m), weight 103.7 kg, SpO2 97 %.  General: No acute distress Mood and affect are appropriate Heart: Regular rate and rhythm no rubs murmurs or extra sounds Lungs: Clear to auscultation, breathing unlabored, no rales or wheezes Abdomen: Positive bowel sounds, soft nontender to palpation, nondistended Extremities: No clubbing, cyanosis, or edema Skin: No evidence of breakdown, no evidence of rash   Neurologic:, motor strength is 5/5 in Right 4-/5 Left  deltoid, bicep, tricep, grip, 4- hip flexor, knee extensors, ankle dorsiflexor and plantar flexor Sensory exam normal sensation to light touch and proprioception in bilateral upper and lower extremities Good fine motor No dysmetria with L FNF but moving very slowly  Musculoskeletal: No pain with upper limb or lower limb active assistive range of motion   Assessment/Plan: 1. 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  Physiatrist is  providing close team supervision and 24 hour management of active medical problems listed below.  Physiatrist and rehab team continue to assess barriers to discharge/monitor patient progress toward functional and medical goals  Care Tool:  Bathing    Body parts bathed by patient: Left arm, Chest, Abdomen, Front perineal area, Right upper leg, Left upper leg, Face, Right lower leg, Buttocks, Left lower leg, Right arm   Body parts bathed by helper: Right arm     Bathing assist Assist Level: Contact Guard/Touching assist     Upper Body Dressing/Undressing Upper body dressing   What is the patient wearing?: Pull over shirt    Upper body assist Assist Level: Supervision/Verbal cueing    Lower Body Dressing/Undressing Lower body dressing      What is the patient wearing?: Pants     Lower body assist Assist for lower body dressing: Contact Guard/Touching assist     Toileting Toileting Toileting Activity did not occur (Clothing management and hygiene only): N/A (no void or bm)  Toileting assist Assist for toileting: Contact Guard/Touching assist     Transfers Chair/bed transfer  Transfers assist     Chair/bed transfer assist level: Contact Guard/Touching assist     Locomotion Ambulation   Ambulation assist      Assist level: Contact Guard/Touching assist Assistive device: Walker-rolling Max distance: 10'   Walk 10 feet activity   Assist     Assist level: Contact Guard/Touching assist Assistive device: (rail)   Walk 50 feet activity   Assist Walk 50 feet  with 2 turns activity did not occur: Safety/medical concerns  Assist level: Minimal Assistance - Patient > 75% Assistive device: (hallway rail)    Walk 150 feet activity   Assist Walk 150 feet activity did not occur: Safety/medical concerns         Walk 10 feet on uneven surface  activity   Assist     Assist level: Moderate Assistance - Patient - 50 - 74% Assistive device: Hand held  assist, Other (comment)(R hand rail)   Wheelchair     Assist Will patient use wheelchair at discharge?: No             Wheelchair 50 feet with 2 turns activity    Assist            Wheelchair 150 feet activity     Assist          Blood pressure 118/68, pulse 79, temperature 98.1 F (36.7 C), resp. rate 18, height 5' 9.02" (1.753 m), weight 103.7 kg, SpO2 97 %.  Medical Problem List and Plan: 1.Left-sided weakness and dysarthriasecondary to 2 small infarcts left cerebellar deep nucleusand posterior to the atrium of the right lateral ventricle most likely separate small vessel disease. Plan 30-day cardiac event monitor as outpatient Unusual presentation- bradykinesia without tremor Cerebellar infarct not causing dysmetria  -patient may shower -ELOS/Goals: 2-2.5 weeks; goals supervision to mod I   2. Antithrombotics: -DVT/anticoagulation:Subcutaneous heparin- no lovenox due to HD, may d/c once amb 100' -antiplatelet therapy: Aspirin and Plavix x3 weeks then Plavix alone 3. Pain Management:Tylenol as needed 4. Mood:Provide emotional support -antipsychotic agents: N/A 5. Neuropsych: This patientiscapable of making decisions on herown behalf. 6. Skin/Wound Care:Routine skin checks 7. Fluids/Electrolytes/Nutrition:Routine in and outs with follow-up chemistries- Hyponatremia, nephro to manage with HD  8. End-stage renal disease. Continue hemodialysis as directed by Nephro 9. Hyperlipidemia. Lipitor 10. Diet controlled diabetes mellitus. Hemoglobin A1c 6.7. Blood sugar checks currently discontinued  11. Permissive hypertension/tachycardia. Lopressor initiated 50mg  twice daily 09/22/2019. Monitor with increased mobility and titrate as required.   Vitals:   09/30/19 1924 10/01/19 0513  BP: 130/61 118/68  Pulse: 89 79  Resp: 18 18  Temp: 99.2 F (37.3 C) 98.1 F (36.7 C)  SpO2: 100% 97%   5/27- controlled in sitting but has orthostatic drops in therapy , add thigh hi TEDs abd binder , notified Nephro PA   12/ UTI,   5/22- Last WBC was 17.2- will recheck in AM- no urine Cx was able to be gotten by nursing- will con't to try - on no ABX- will monitor  5/23- Nursing got Cx- is staph capitus >100k- WBC is down to 12.2 from >17k- afebrile; pt feels good- sens to oxacillin, 7d course amoxil  , low grade temp this am , asymptomatic    LOS: 8 days A FACE TO FACE EVALUATION WAS PERFORMED  Charlett Blake 10/01/2019, 9:03 AM

## 2019-10-01 NOTE — Progress Notes (Signed)
Occupational Therapy Weekly Progress Note  Patient Details  Name: [name] MRN: [id number] Date of Birth: [birth year]  Beginning of progress report period: Sep 24, 2019 End of progress report period: Oct 01, 2019  Today's Date: 10/01/2019 OT Individual Time: 0902-1000 OT Individual Time Calculation (min): 58 min    Patient has met 3 of 3 short term goals. Patient currently requiring set-up assist for UB bathing/dressing and CGA at most for LB bathing/dressing in sitting/standing without use of AE. Patient able to complete 3/3 toileting tasks with CGA at most. Patient currently able to complete functional transfers with supervision A at best but occasionally requires CGA secondary to fatigue. Patient demonstrates increased ability to initiate tasks with use of affected LUE compared to initial evaluation. Patient continues to be limited by decreaed [place] in LUE, generalized weakness, and fatigue requiring frequent rest breaks with all BADLs and functional mobility.   Patient continues to demonstrate the following deficits: decreased cardiorespiratoy endurance, impaired timing and sequencing, abnormal tone, unbalanced muscle activation, ataxia, decreased coordination and decreased motor planning and decreased initiation, decreased attention, decreased awareness, decreased safety awareness and delayed processing.and therefore will continue to benefit from skilled OT intervention to enhance overall performance with BADL and Reduce care partner burden.  Patient progressing toward long term goals..  Continue plan of care.  OT Short Term Goals Week 1:  OT Short Term Goal 2 (Week 1): Patient will don UB clothing with supervision A. OT Short Term Goal 2 - Progress (Week 1): Met OT Short Term Goal 3 (Week 1): Patient will don LB clothing with Min A in sitting/standing with LRAD. OT Short Term Goal 3 - Progress (Week 1): Met OT Short Term Goal 4 (Week 1): Patient will complete 3/3 parts of toileting with  Min A. OT Short Term Goal 4 - Progress (Week 1): Met Week 2:  OT Short Term Goal 1 (Week 2): STG = LTG 2/2 ELOS  Skilled Therapeutic Interventions/Progress Updates:  Patient met lying supine in bed in agreement with OT treatment session with focus on self-care re-education, NMR, activity tolerance, and household mobility as detailed below. Patient reports washing up with nursing staff this A.M. but requesting to complete dressing. Patient donned UB clothing with set-up assist. Patient able to thread BLE into pants and hike over hips in standing with supervision A. Patient declined standing at sink level for hair combing task 2/2 fatigue with request to sit. Assistance required to detangle hair secondary to matting, decreased LUE AROM, and decreased [place]. Functional mobility from room to dayroom with 2 seated rest breaks 2/2 fatigue. Patient transported last 49f to high/low table in dayroom for time management. LUE NMR with focus on distal AROM and FMC with coin stacking, finger opposition, crumpling paper, and isolated extension of digits 1-5. Total A for wc transport back to room for time management and energy conservation. Session concluded with patient seated in wc with call bell within reach, belt alarm activated, and all needs met.   Therapy Documentation Precautions:  Precautions Precautions: Fall Precaution Comments: L hemiparesis/watch BP Restrictions Weight Bearing Restrictions: No General:    Therapy/Group: Individual Therapy  Domanic Matusek R Howerton-Davis 10/01/2019, 7:43 AM

## 2019-10-01 NOTE — Progress Notes (Signed)
Physical Therapy Weekly Progress Note  Patient Details  Name: [name] MRN: [id number] Date of Birth: [birth year]  Beginning of progress report period: Sep 24, 2019 End of progress report period: Oct 01, 2019  Today's Date: 10/01/2019 PT Individual Time: 3532-9924 PT Individual Time Calculation (min): 70 min   Patient has met 3 of 4 short term goals.  [name] is progressing well with therapy demonstrating ability to perform supine<>sit with CGA, sit<>stands no AD with CGA, stand pivot transfers no AD with CGA, ambulating up to 111f no AD with min assist and navigating up/down 4steps using B HRs with CGA. She is demonstrating improving activity tolerance although continues to require seated rest breaks between each task. She continues to demonstrate mild L inattention and L hemiparesis with impaired L LE gait mechanics.   Patient continues to demonstrate the following deficits muscle weakness, decreased cardiorespiratoy endurance, impaired timing and sequencing, abnormal tone, unbalanced muscle activation and decreased motor planning and decreased sitting balance, decreased standing balance, decreased postural control and decreased balance strategies and therefore will continue to benefit from skilled PT intervention to increase functional independence with mobility.  Patient progressing toward long term goals..  Continue plan of care.  PT Short Term Goals Week 1:  PT Short Term Goal 1 (Week 1): Pt will perform bed mobility with CGA. PT Short Term Goal 1 - Progress (Week 1): Met PT Short Term Goal 2 (Week 1): Pt will perform bed to chair transfer with CGA. PT Short Term Goal 2 - Progress (Week 1): Met PT Short Term Goal 3 (Week 1): Pt will ambulate 563 with CGA. PT Short Term Goal 3 - Progress (Week 1): Met PT Short Term Goal 4 (Week 1): Pt will perform 10 steps with CGA and BHRs. PT Short Term Goal 4 - Progress (Week 1): Progressing toward goal Week 2:  PT Short Term Goal 1 (Week 2):  = to LTGs based on ELOS   Note: She is unable to a perform 10 steps consecutively due to fatigue but is able to navigate 4steps x3 bouts with CGA.    Skilled Therapeutic Interventions/Progress Updates:  Ambulation/gait training;Community reintegration;Neuromuscular re-education;DME/adaptive equipment instruction;Psychosocial support;Stair training;UE/LE Strength taining/ROM;Wheelchair propulsion/positioning;Balance/vestibular training;Discharge planning;Functional electrical stimulation;Pain management;Therapeutic Activities;UE/LE Coordination activities;Cognitive remediation/compensation;Functional mobility training;Disease management/prevention;Patient/family education;Splinting/orthotics;Therapeutic Exercise;Visual/perceptual remediation/compensation   Pt received sitting in w/c and agreeable to therapy session. Donned tennis shoes for therapy session. Transported to/from gym in w/c for time management and energy conservation.  Sit<>stands, no AD, with CGA for steadying. Gait training 1275f no AD, with min assist for balance - continues to demonstrate lack of L LE foot clearance due to inadequate hip/knee flexion during swing and R lateral trunk flexion during R LE stance phase demonstrating either weak R hip abductors and/or weak L hip flexors. Standing, no UE support, L LE NMR via forward foot taps on 8" step 3x10reps with min assist progressed to CGA for balance - cuing for increased speed of movement. Standing, no UE support, L LE NMR focusing on stance control via R LE forward foot taps on 8" step progressed to R LE lateral foot taps to increased L hip abductor activation 2x10 reps each and CGA for steadying with 1x min assist due to slight L lateral LOB. Gait training ~9028f2, no AD, with min assist for balance - max cuing and demonstration for improved L LE foot clearance (increased hip/knee flexion) during swing and heel strike on initial contact. Ascended/descended 4 steps x3 reps (required  seated break  between each bout due to fatigue) using BHRs with CGA for steadying - reciprocal pattern on ascent and step-to pattern leading with L LE on descent. Sit>supine with CGA for L LE steadying. Supine bridges with level 2 theraband around knees to increase hip abductor activation 2x10 reps - cuing for increased glute muscle activation. Supine>sitting on mat with supervision. Stand pivot to w/c with CGA. Transported back to room and stand pivot to EOB with CGA. Doffed shoes. Sit>supine with supervision using bedrails. Pt left supine in bed with needs in reach and bed alarm on.  Therapy Documentation Precautions:  Precautions Precautions: Fall Precaution Comments: L hemiparesis/watch BP Restrictions Weight Bearing Restrictions: No  Pain:   Denies pain during session.  Therapy/Group: Individual Therapy  Tawana Scale, PT, DPT 10/01/2019, 8:01 AM

## 2019-10-01 NOTE — Plan of Care (Signed)
  Problem: RH BOWEL ELIMINATION Goal: RH STG MANAGE BOWEL WITH ASSISTANCE Description: STG Manage Bowel with Mod I Assistance. Outcome: Progressing   Problem: RH BLADDER ELIMINATION Goal: RH STG MANAGE BLADDER WITH ASSISTANCE Description: STG Manage Bladder With Mod I Assistance 10/01/2019 1747 by Adria Devon, LPN Outcome: Progressing 10/01/2019 1124 by [name] A, LPN Outcome: Progressing   Problem: RH SAFETY Goal: RH STG ADHERE TO SAFETY PRECAUTIONS W/ASSISTANCE/DEVICE Description: STG Adhere to Safety Precautions With supervision Assistance/Device. 10/01/2019 1747 by Adria Devon, LPN Outcome: Progressing 10/01/2019 1124 by [name] A, LPN Outcome: Progressing   Problem: RH KNOWLEDGE DEFICIT Goal: RH STG INCREASE KNOWLEDGE OF STROKE PROPHYLAXIS Description: Pt will be able to verbalize stroke prevention techniques with min assistance, cues, and educational material.  Outcome: Progressing

## 2019-10-01 NOTE — Plan of Care (Signed)
  Problem: RH BLADDER ELIMINATION Goal: RH STG MANAGE BLADDER WITH ASSISTANCE Description: STG Manage Bladder With Mod I Assistance Outcome: Progressing   Problem: RH SAFETY Goal: RH STG ADHERE TO SAFETY PRECAUTIONS W/ASSISTANCE/DEVICE Description: STG Adhere to Safety Precautions With supervision Assistance/Device. Outcome: Progressing   Problem: RH KNOWLEDGE DEFICIT Goal: RH STG INCREASE KNOWLEDGE OF STROKE PROPHYLAXIS Description: Pt will be able to verbalize stroke prevention techniques with min assistance, cues, and educational material.  Outcome: Progressing

## 2019-10-01 NOTE — Progress Notes (Signed)
[facility] Progress Note   Subjective: Very pleasant, no C/Os.   Objective Vitals:   09/30/19 1924 10/01/19 0513 10/01/19 0620 10/01/19 1351  BP: 130/61 118/68  (!) 115/52  Pulse: 89 79  87  Resp: 18 18  16   Temp: 99.2 F (37.3 C) 98.1 F (36.7 C)  98.2 F (36.8 C)  TempSrc: Oral   Oral  SpO2: 100% 97%  100%  Weight:   103.7 kg   Height:       Physical Exam General: Pleasant older female in NAD Heart: S1,S2 RRR Lungs: CTAB Abdomen: obese active BS Extremities: no LE edema Dialysis Access: L AVF + bruit    Additional Objective Labs: Basic Metabolic Panel: Recent Labs  Lab 09/26/19 0231 09/27/19 1257 09/29/19 1530  NA 133* 127* 128*  K 4.2 3.6 3.6  CL 92* 87* 90*  CO2 27 23 25   GLUCOSE 117* 203* 170*  BUN 32* 56* 41*  CREATININE 7.96* 11.17* 9.96*  CALCIUM 9.8 9.4 9.4  PHOS 5.1* 4.0 3.9   Liver Function Tests: Recent Labs  Lab 09/26/19 0231 09/27/19 1257 09/29/19 1530  ALBUMIN 3.4* 3.5 3.3*   No results for input(s): LIPASE, AMYLASE in the last 168 hours. CBC: Recent Labs  Lab 09/26/19 0231 09/27/19 1257 09/29/19 1530  WBC 12.2* 11.3* 8.6  NEUTROABS 8.4*  --   --   HGB 10.2* 9.9* 9.3*  HCT 35.3* 33.4* 30.9*  MCV 80.0 79.7* 79.2*  PLT 466* 444* 396   Blood Culture    Component Value Date/Time   SDES URINE, CATHETERIZED 09/24/2019 1514   SPECREQUEST  09/24/2019 1514    NONE Performed at [facility], [place] [address], [place], [place] [address]    CULT >=100,000 COLONIES/mL STAPHYLOCOCCUS CAPITIS (A) 09/24/2019 1514   REPTSTATUS 09/27/2019 FINAL 09/24/2019 1514    Cardiac Enzymes: No results for input(s): CKTOTAL, CKMB, CKMBINDEX, TROPONINI in the last 168 hours. CBG: No results for input(s): GLUCAP in the last 168 hours. Iron Studies: No results for input(s): IRON, TIBC, TRANSFERRIN, FERRITIN in the last 72 hours. @lablastinr3 @ Studies/Results: No results found. Medications:  . amoxicillin  250 mg Oral  Daily  . aspirin EC  81 mg Oral Daily  . atorvastatin  40 mg Oral Daily  . [START ON 10/02/2019] Chlorhexidine Gluconate Cloth  6 each Topical Q0600  . clopidogrel  75 mg Oral Daily  . darbepoetin (ARANESP) injection - DIALYSIS  200 mcg Intravenous Q Wed-HD  . feeding supplement (NEPRO CARB STEADY)  237 mL Oral TID BM  . ferric citrate  420 mg Oral TID WC  . heparin  5,000 Units Subcutaneous Q8H  . metoprolol tartrate  50 mg Oral BID  . multivitamin  1 tablet Oral QHS  . ondansetron  4 mg Oral Q12H     Dialysis Orders: [facility] 4h 400/800 105.5kg 3K/2.25 bath UFP #3 LUE AVF Hep 2000 - Hectoral 100mcg Iv q HD - Mircera 234mcg IV q 2 weeks (last 5/10) - Venofer 100 x 10 ordered for Hgb 8.5, tsat 7% on 5/10.  Problem/Plan: 1. Acute L cerebellar + subcentimeter deep white matter R TAV:[id number] L sided weakness and facial droop, improving. Echo normal. CTA without carotid or posterior circulation stenoses. Prior CVA on imaging that she was unaware of.Neuro followed, plan is for 30d event monitor to r/o A-fib. transitioned to CIR  2. ESRD:Continue HD per MWF schedule. HD today.Checking labs today 3. Hypertension/volume:PermissiveHTN w/ acute CVA.BP now controlled/low sided. Below EDW.  UF as tolerated.  4. Anemia(ESRD + IDA):Hgb 9.3 09/29/2019. 200 mcg aranesp IVwith Hd 5/26.On high dose ESA as outpatient. 5. Metabolic bone disease:Ca ok,Phos better -resumedhome binder Lorin Picket). 6. T2DM- per admit 7. Hyperlipidemia: Prev on statin - off for unknown time, resumed here. 8. Leukocytosis:WBC16.9risingand now down to 12>11.3>8.3 , asymptomatic UTI = Staph .>100,000 with Admit team rx Amox 9. Gastroparesis: Chronic issue.No C/O N & V today.   Rita H. Brown NP-C 10/01/2019, 2:51 PM  [employer] [phone]

## 2019-10-02 ENCOUNTER — Inpatient Hospital Stay (HOSPITAL_COMMUNITY): Payer: BC Managed Care – PPO | Admitting: Occupational Therapy

## 2019-10-02 LAB — RENAL FUNCTION PANEL
Albumin: 3.1 g/dL — ABNORMAL LOW (ref 3.5–5.0)
Anion gap: 12 (ref 5–15)
BUN: 37 mg/dL — ABNORMAL HIGH (ref 6–20)
CO2: 26 mmol/L (ref 22–32)
Calcium: 9.6 mg/dL (ref 8.9–10.3)
Chloride: 94 mmol/L — ABNORMAL LOW (ref 98–111)
Creatinine, Ser: 11.74 mg/dL — ABNORMAL HIGH (ref 0.44–1.00)
GFR calc Af Amer: 4 mL/min — ABNORMAL LOW (ref >60)
GFR calc non Af Amer: 3 mL/min — ABNORMAL LOW (ref >60)
Glucose, Bld: 124 mg/dL — ABNORMAL HIGH (ref 70–99)
Phosphorus: 3.7 mg/dL (ref 2.5–4.6)
Potassium: 4.6 mmol/L (ref 3.5–5.1)
Sodium: 132 mmol/L — ABNORMAL LOW (ref 135–145)

## 2019-10-02 LAB — CBC
HCT: 32.1 % — ABNORMAL LOW (ref 36.0–46.0)
Hemoglobin: 9.6 g/dL — ABNORMAL LOW (ref 12.0–15.0)
MCH: 24.1 pg — ABNORMAL LOW (ref 26.0–34.0)
MCHC: 29.9 g/dL — ABNORMAL LOW (ref 30.0–36.0)
MCV: 80.5 fL (ref 80.0–100.0)
Platelets: 391 10*3/uL (ref 150–400)
RBC: 3.99 MIL/uL (ref 3.87–5.11)
RDW: 19.8 % — ABNORMAL HIGH (ref 11.5–15.5)
WBC: 7.7 10*3/uL (ref 4.0–10.5)
nRBC: 0.5 % — ABNORMAL HIGH (ref 0.0–0.2)

## 2019-10-02 MED ORDER — LIDOCAINE HCL (PF) 1 % IJ SOLN: 5.0000 mL | INTRAMUSCULAR | Status: DC | PRN

## 2019-10-02 MED ORDER — SODIUM CHLORIDE 0.9 % IV SOLN: 100.0000 mL | INTRAVENOUS | Status: DC | PRN

## 2019-10-02 MED ORDER — METOPROLOL TARTRATE 12.5 MG HALF TABLET
12.5000 mg | ORAL_TABLET | Freq: Two times a day (BID) | ORAL | Status: DC
  Administered 2019-10-02 – 2019-10-08 (×10): 12.5 mg via ORAL
  Filled 2019-10-02 (×11): qty 1

## 2019-10-02 MED ORDER — HEPARIN SODIUM (PORCINE) 1000 UNIT/ML IJ SOLN
INTRAMUSCULAR | Status: AC
  Administered 2019-10-02: 2000 [IU] via INTRAVENOUS_CENTRAL
  Filled 2019-10-02: qty 2

## 2019-10-02 MED ORDER — HEPARIN SODIUM (PORCINE) 1000 UNIT/ML DIALYSIS: 1000.0000 [IU] | INTRAMUSCULAR | Status: DC | PRN

## 2019-10-02 MED ORDER — PENTAFLUOROPROP-TETRAFLUOROETH EX AERO: 1.0000 "application " | INHALATION_SPRAY | CUTANEOUS | Status: DC | PRN

## 2019-10-02 MED ORDER — HEPARIN SODIUM (PORCINE) 1000 UNIT/ML DIALYSIS: 2000.0000 [IU] | Freq: Once | INTRAMUSCULAR | Status: AC

## 2019-10-02 MED ORDER — LIDOCAINE-PRILOCAINE 2.5-2.5 % EX CREA: 1.0000 "application " | TOPICAL_CREAM | CUTANEOUS | Status: DC | PRN

## 2019-10-02 MED ORDER — ALTEPLASE 2 MG IJ SOLR: 2.0000 mg | Freq: Once | INTRAMUSCULAR | Status: DC | PRN

## 2019-10-02 NOTE — Progress Notes (Signed)
Orthostatic VS recorted by assigned NT( [place])i, patient assessed, MEWS triggered yellow alarm per protocol , no acute distress noted or verbalized by patient,

## 2019-10-02 NOTE — Plan of Care (Signed)
  Problem: RH BLADDER ELIMINATION Goal: RH STG MANAGE BLADDER WITH ASSISTANCE Description: STG Manage Bladder With Mod I Assistance Outcome: Progressing   Problem: RH KNOWLEDGE DEFICIT Goal: RH STG INCREASE KNOWLEDGE OF HYPERTENSION Description: Pt will verbalize management of hypertension including medications and stress management with handouts and cues  Outcome: Progressing Goal: RH STG INCREASE KNOWLEDGE OF STROKE PROPHYLAXIS Description: Pt will be able to verbalize stroke prevention techniques with min assistance, cues, and educational material.  Outcome: Progressing

## 2019-10-02 NOTE — Progress Notes (Signed)
Occupational Therapy Session Note  Patient Details  Name: [name] MRN: [id number] Date of Birth: December 05, 1960  Today's Date: 10/02/2019 OT Individual Time: 2446-9507 OT Individual Time Calculation (min): 29 min    Short Term Goals: Week 2:  OT Short Term Goal 1 (Week 2): STG = LTG 2/2 ELOS  Skilled Therapeutic Interventions/Progress Updates:   Pt completed supine to sit EOB with supervision.  She was then able to sit on the side of the bed while engaged in LUE FM coordination task with reaching.  Had her work on picking up small foam pieces one at a time to begin and place in cup, also placed on the bedside table.  She was able to pick up and place 18 pieces in one minute and then 22 pieces in 50 seconds with the LUE, demonstrating improved coordination and speed compared to last week.  Also had her work on Product/process development scientist by picking up 3 pieces, one at a time, and placing them in the cup, working them out to the finger tips.  Finished session with return to supine and pt resting in preparation for dialysis soon.  Call button and phone in reach with safety belt in place.     Therapy Documentation Precautions:  Precautions Precautions: Fall Precaution Comments: L hemiparesis/watch BP Restrictions Weight Bearing Restrictions: No  Pain: Pain Assessment Pain Scale: Faces Pain Score: 0-No pain ADL: See Care Tool Section for some details of mobility and selfcare  Therapy/Group: Individual Therapy  Maidie Streight OTR/L 10/02/2019, 2:40 PM

## 2019-10-02 NOTE — Progress Notes (Signed)
Subjective:  No cos , HD for today 2/2 emergent cases yest    Objective Vital signs in last 24 hours: Vitals:   10/02/19 0537 10/02/19 0539 10/02/19 0601 10/02/19 1343  BP: 104/64 (!) 94/42  116/65  Pulse: 81 (!) 101  75  Resp: 16 18  18   Temp:    98.4 F (36.9 C)  TempSrc:      SpO2: 100% 100%  100%  Weight:   104.1 kg   Height:       Weight change: 0.4 kg  Physical Exam General:alert,NAD Heart:RRR, no murmur Lungs:CTAB Abdomen: bs +,soft, non-tender, ND Extremities:no LE edema Dialysis Access:LUE AVF + bruit  Dialysis Orders: [facility] 4h 400/800 105.5kg 3K/2.25 bath UFP #3 LUE AVF Hep 2000 - Hectoral 23mcg Iv q HD - Mircera 247mcg IV q 2 weeks (last 5/10) - Venofer 100 x 10 ordered --> for Hgb 8.5, tsat 7% on 5/10.  Problem/Plan: 1. Acute L cerebellar + subcentimeter deep white matter R SEG:[id number] L sided weakness and facial droop, improving. Echo normal. CTA without carotid or posterior circulation stenoses. Prior CVA on imaging that she was unaware of.Neuro followed, plan is for 30d event monitor to r/o A-fib.///transitioned to CIR  2. ESRD:Continue HD per MWF schedule, missed yest 2/2 emergent  Hd , hd today  3. Hypertension/volume:PermissiveHTN w/ acute CVA.BP now controlled/low sided. Below EDW.  on metoprolol 50 mg bid  Will decr to 12.5mg  bid and ,decr uf  With today  Hd,no excess volume  On exam  4. Anemia(ESRD + IDA):Hgb10.2> 9.9>9.3 . 200 aranespHd 5/26//,On high dose ESA as outpatient. 5. Metabolic bone disease:Ca ok,Phos better -resumedhome binder Lorin Picket). 6. T2DM- per admit 7. Hyperlipidemia: Prev on statin - off for unknown time, resumed here. 8. Leukocytosis:WBC16.9risingand now down to 12>11.3>8.3 , asymptomatic UTI = Staph .>100,000 with Admit team rx Amox 9. Gastroparesis: Chronic issue.no issue today  Ernest Haber, PA-C [facility]per [phone] 10/02/2019,2:11 PM  LOS: 9 days    Labs: Basic Metabolic Panel: Recent Labs  Lab 09/26/19 0231 09/27/19 1257 09/29/19 1530  NA 133* 127* 128*  K 4.2 3.6 3.6  CL 92* 87* 90*  CO2 27 23 25   GLUCOSE 117* 203* 170*  BUN 32* 56* 41*  CREATININE 7.96* 11.17* 9.96*  CALCIUM 9.8 9.4 9.4  PHOS 5.1* 4.0 3.9   Liver Function Tests: Recent Labs  Lab 09/26/19 0231 09/27/19 1257 09/29/19 1530  ALBUMIN 3.4* 3.5 3.3*   No results for input(s): LIPASE, AMYLASE in the last 168 hours. No results for input(s): AMMONIA in the last 168 hours. CBC: Recent Labs  Lab 09/26/19 0231 09/27/19 1257 09/29/19 1530  WBC 12.2* 11.3* 8.6  NEUTROABS 8.4*  --   --   HGB 10.2* 9.9* 9.3*  HCT 35.3* 33.4* 30.9*  MCV 80.0 79.7* 79.2*  PLT 466* 444* 396   Cardiac Enzymes: No results for input(s): CKTOTAL, CKMB, CKMBINDEX, TROPONINI in the last 168 hours. CBG: No results for input(s): GLUCAP in the last 168 hours.  Studies/Results: No results found. Medications:  . amoxicillin  250 mg Oral Daily  . aspirin EC  81 mg Oral Daily  . atorvastatin  40 mg Oral Daily  . Chlorhexidine Gluconate Cloth  6 each Topical Q0600  . clopidogrel  75 mg Oral Daily  . darbepoetin (ARANESP) injection - DIALYSIS  200 mcg Intravenous Q Wed-HD  . feeding supplement (NEPRO CARB STEADY)  237 mL Oral TID BM  . ferric citrate  420 mg Oral  TID WC  . heparin  5,000 Units Subcutaneous Q8H  . metoprolol tartrate  12.5 mg Oral BID  . multivitamin  1 tablet Oral QHS  . ondansetron  4 mg Oral Q12H

## 2019-10-03 ENCOUNTER — Inpatient Hospital Stay (HOSPITAL_COMMUNITY): Payer: BC Managed Care – PPO

## 2019-10-03 NOTE — Progress Notes (Signed)
Physical Therapy Session Note  Patient Details  Name: [name] MRN: [id number] Date of Birth: 11-07-1960  Today's Date: 10/03/2019 PT Individual Time: 1302-1400 PT Individual Time Calculation (min): 58 min    Short Term Goals: Week 2:  PT Short Term Goal 1 (Week 2): = to LTGs based on ELOS  Skilled Therapeutic Interventions/Progress Updates:    Pt supine in bed upon PT arrival, agreeable to therapy tx and denies pain. Pt transferred to sitting EOB with supervision, ambulated from room x 100 ft with RW and CGA before needing to sit secondary to fatigue. Pt transported the rest of they way to dayroom. Stand pivot to the nustep with CGA, pt used nustep this session x6 minutes on resistance level 5 for global strength and endurance. Pt transported to the gym. Pt ambulated x 50 ft this session without AD working on dynamic balance and gait, pt maintains wide BOS and short steps. Pt worked on dynamic balance and hip strength to perform sidestepping in front of the mat without UE support 3 x 8 ft in each direction, min assist. Pt continued to work on dynamic balance without UE support to perform the following tasks - alternating toe taps on aerobic step x 2 trials, lateral toe taps on aerobic step x 1 trial per side, standing on airex while performing x 10 mini squats, and standing on airex feet together while performing ball toss against rebound trampoline x 2 trials. Pt ambulated x 50 ft back to w/c without AD, min assist. Pt transported back to room in w/c and left seated in w/c with needs in reach and chair alarm set.   Therapy Documentation Precautions:  Precautions Precautions: Fall Precaution Comments: L hemiparesis/watch BP Restrictions Weight Bearing Restrictions: No    Therapy/Group: Individual Therapy  Netta Corrigan, PT, DPT, CSRS 10/03/2019, 7:55 AM

## 2019-10-03 NOTE — Progress Notes (Addendum)
Subjective:  Tolerated HD yest  No dizziness  1.0 liter uf  bp stable for her ,No cos , hd for am , she notes left side weakness improving vastly since admit , tolerating  PT,OT  Objective Vital signs in last 24 hours: Vitals:   10/02/19 2053 10/03/19 0543 10/03/19 0813 10/03/19 1307  BP: 133/69 (!) 92/49 113/60 128/63  Pulse: 86 88 91 75  Resp: 18 16  16   Temp: 98.2 F (36.8 C) 99.1 F (37.3 C)  99.6 F (37.6 C)  TempSrc: Oral Oral  Oral  SpO2: 100% 100%  100%  Weight:  103.7 kg    Height:       Weight change: 0.7 kg  Physical Exam General:alert,NAD Heart:RRR, no murmur Lungs:CTAB Abdomen:bs +,soft, non-tender, ND Extremities:no LE edema Dialysis Access:LUE AVF + bruit  Dialysis Orders: [facility]WF [facility] 4h 400/800 105.5kg 3K/2.25 bath UFP #3 LUE AVF Hep 2000 - Hectoral 41mcg Iv q HD - Mircera 241mcg IV q 2 weeks (last 5/10) - Venofer 100 x 10 ordered --> for Hgb 8.5, tsat 7% on 5/10.  Problem/Plan: 1. Acute L cerebellar + subcentimeter deep white matter R HAL:[id number] L sided weakness(has improved over the week ) and facial droop improving. Echo normal. CTA without carotid or posterior circulation stenoses. Prior CVA on imaging that she was unaware of.Neuro followed, plan is for 30d event monitor to r/o A-fib.///transitioned to CIR  2. ESRD:Continue HD per MWF schedule, off schedule 2/2 emergent  Hd , hd tomro on schedule  3. Hypertension/volume:PermissiveHTN w/ acute CVA.BP now controlled/low sided. Below EDW. on metoprolol 50 mg bid  have decr to 12.5mg  bid and ,decr uf  With  Hd,no excess volume  On exam / below edw ~2 kg  4. Anemia(ESRD + IDA):Hgb10.2> 9.9>9.3>9.6 . 200 aranespHd 5/26//,On high dose ESA as outpatient. 5. Metabolic bone disease:Corec Ca10.3  [name] 4  Not given  Use 2.0 ca bath ,  Phos 3.7-resumedhome binder Lorin Picket). 6. T2DM- per admit 7. Hyperlipidemia: Prev on statin - off for unknown time, resumed  here. 8. Leukocytosis:WBC16.9risingand now down to 12>11.3>8.3>7.7 , asymptomatic UTI = Staph .>100,000 with Admit team rx Amoxlast day today  9. Gastroparesis: Chronic issue.no issue today  Ernest Haber, PA-C [facility] [phone] 10/03/2019,1:57 PM  LOS: 10 days   Labs: Basic Metabolic Panel: Recent Labs  Lab 09/27/19 1257 09/29/19 1530 10/02/19 1617  NA 127* 128* 132*  K 3.6 3.6 4.6  CL 87* 90* 94*  CO2 23 25 26   GLUCOSE 203* 170* 124*  BUN 56* 41* 37*  CREATININE 11.17* 9.96* 11.74*  CALCIUM 9.4 9.4 9.6  PHOS 4.0 3.9 3.7   Liver Function Tests: Recent Labs  Lab 09/27/19 1257 09/29/19 1530 10/02/19 1617  ALBUMIN 3.5 3.3* 3.1*   No results for input(s): LIPASE, AMYLASE in the last 168 hours. No results for input(s): AMMONIA in the last 168 hours. CBC: Recent Labs  Lab 09/27/19 1257 09/29/19 1530 10/02/19 1617  WBC 11.3* 8.6 7.7  HGB 9.9* 9.3* 9.6*  HCT 33.4* 30.9* 32.1*  MCV 79.7* 79.2* 80.5  PLT 444* 396 391   Cardiac Enzymes: No results for input(s): CKTOTAL, CKMB, CKMBINDEX, TROPONINI in the last 168 hours. CBG: No results for input(s): GLUCAP in the last 168 hours.  Studies/Results: No results found. Medications:  . aspirin EC  81 mg Oral Daily  . atorvastatin  40 mg Oral Daily  . clopidogrel  75 mg Oral Daily  . darbepoetin (ARANESP) injection - DIALYSIS  200 mcg Intravenous Q Wed-HD  . feeding supplement (NEPRO CARB STEADY)  237 mL Oral TID BM  . ferric citrate  420 mg Oral TID WC  . heparin  5,000 Units Subcutaneous Q8H  . metoprolol tartrate  12.5 mg Oral BID  . multivitamin  1 tablet Oral QHS  . ondansetron  4 mg Oral Q12H

## 2019-10-03 NOTE — Progress Notes (Signed)
[facility] PHYSICAL MEDICINE & REHABILITATION PROGRESS NOTE   Subjective/Complaints:  No dizziness today.  No other complaints.  Review of systems -  Pt denies SOB, abd pain, CP, N/V/C/D, and vision changes   Objective:   No results found. Recent Labs    10/02/19 1617  WBC 7.7  HGB 9.6*  HCT 32.1*  PLT 391   Recent Labs    10/02/19 1617  NA 132*  K 4.6  CL 94*  CO2 26  GLUCOSE 124*  BUN 37*  CREATININE 11.74*  CALCIUM 9.6    Intake/Output Summary (Last 24 hours) at 10/03/2019 1126 Last data filed at 10/03/2019 0810 Gross per 24 hour  Intake 342 ml  Output 1000 ml  Net -658 ml     Physical Exam: Vital Signs Blood pressure 113/60, pulse 91, temperature 99.1 F (37.3 C), temperature source Oral, resp. rate 16, height 5' 9.02" (1.753 m), weight 103.7 kg, SpO2 100 %.   General: No acute distress Mood and affect are appropriate Heart: Regular rate and rhythm no rubs murmurs or extra sounds Lungs: Clear to auscultation, breathing unlabored, no rales or wheezes Abdomen: Positive bowel sounds, soft nontender to palpation, nondistended Extremities: No clubbing, cyanosis, or edema Skin: No evidence of breakdown, no evidence of rash  Neurologic:, motor strength is 5/5 in Right 4-/5 Left  deltoid, bicep, tricep, grip, 4- hip flexor, knee extensors, ankle dorsiflexor and plantar flexor Sensory exam normal sensation to light touch and proprioception in bilateral upper and lower extremities Good fine motor No dysmetria with L FNF but moving very slowly  Musculoskeletal: No pain with upper limb or lower limb active assistive range of motion   Assessment/Plan: 1. 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  Physiatrist is providing close team supervision  and 24 hour management of active medical problems listed below.  Physiatrist and rehab team continue to assess barriers to discharge/monitor patient progress toward functional and medical goals  Care Tool:  Bathing    Body parts bathed by patient: Left arm, Chest, Abdomen, Front perineal area, Right upper leg, Left upper leg, Face, Right lower leg, Buttocks, Left lower leg, Right arm   Body parts bathed by helper: Right arm     Bathing assist Assist Level: Contact Guard/Touching assist     Upper Body Dressing/Undressing Upper body dressing   What is the patient wearing?: Pull over shirt    Upper body assist Assist Level: Set up assist    Lower Body Dressing/Undressing Lower body dressing      What is the patient wearing?: Pants     Lower body assist Assist for lower body dressing: Supervision/Verbal cueing     Toileting Toileting Toileting Activity did not occur (Clothing management and hygiene only): N/A (no void or bm)  Toileting assist Assist for toileting: Contact Guard/Touching assist     Transfers Chair/bed transfer  Transfers assist     Chair/bed transfer assist level: Contact Guard/Touching assist     Locomotion Ambulation   Ambulation assist      Assist level: Minimal Assistance - Patient > 75% Assistive device: No Device Max distance: 135ft   Walk 10 feet activity   Assist     Assist level: Minimal Assistance - Patient > 75% Assistive device: No Device   Walk 50 feet activity   Assist Walk  50 feet with 2 turns activity did not occur: Safety/medical concerns  Assist level: Minimal Assistance - Patient > 75% Assistive device: No Device    Walk 150 feet activity   Assist Walk 150 feet activity did not occur: Safety/medical concerns         Walk 10 feet on uneven surface  activity   Assist     Assist level: Moderate Assistance - Patient - 50 - 74% Assistive device: Hand held assist, Other (comment)(R hand rail)    Wheelchair     Assist Will patient use wheelchair at discharge?: No             Wheelchair 50 feet with 2 turns activity    Assist            Wheelchair 150 feet activity     Assist          Blood pressure 113/60, pulse 91, temperature 99.1 F (37.3 C), temperature source Oral, resp. rate 16, height 5' 9.02" (1.753 m), weight 103.7 kg, SpO2 100 %.  Medical Problem List and Plan: 1.Left-sided weakness and dysarthriasecondary to 2 small infarcts left cerebellar deep nucleusand posterior to the atrium of the right lateral ventricle most likely separate small vessel disease. Plan 30-day cardiac event monitor as outpatient Unusual presentation- bradykinesia without tremor Cerebellar infarct not causing dysmetria  -patient may shower -ELOS/Goals: 2-2.5 weeks; goals supervision to mod I   2. Antithrombotics: -DVT/anticoagulation:Subcutaneous heparin- no lovenox due to HD, may d/c once amb 100' -antiplatelet therapy: Aspirin and Plavix x3 weeks then Plavix alone 3. Pain Management:Tylenol as needed 4. Mood:Provide emotional support -antipsychotic agents: N/A 5. Neuropsych: This patientiscapable of making decisions on herown behalf. 6. Skin/Wound Care:Routine skin checks 7. Fluids/Electrolytes/Nutrition:Routine in and outs with follow-up chemistries- Hyponatremia, nephro to manage with HD  8. End-stage renal disease. Continue hemodialysis as directed by Nephro 9. Hyperlipidemia. Lipitor 10. Diet controlled diabetes mellitus. Hemoglobin A1c 6.7. Blood sugar checks currently discontinued  11. Permissive hypertension/tachycardia. Lopressor initiated 50mg  twice daily 09/22/2019. Monitor with increased mobility and titrate as required.   Vitals:   10/03/19 0543 10/03/19 0813  BP: (!) 92/49 113/60  Pulse: 88 91  Resp: 16   Temp: 99.1 F (37.3 C)   SpO2: 100%   5/30- controlled in sitting  but has orthostatic drops in therapy , add thigh hi TEDs abd binder , notified Nephro PA , HD adjusted, no drop with sit to stand this am  12/ UTI,   5/22- Last WBC was 17.2- will recheck in AM- no urine Cx was able to be gotten by nursing- will con't to try - on no ABX- will monitor  5/23- Nursing got Cx- is staph capitus >100k- WBC is down to 12.2 from >17k- afebrile; pt feels good- sens to oxacillin, 7d course amoxil    LOS: 10 days A FACE TO [place] E Ervie Mccard 10/03/2019, 11:26 AM

## 2019-10-04 ENCOUNTER — Inpatient Hospital Stay (HOSPITAL_COMMUNITY): Payer: BC Managed Care – PPO

## 2019-10-04 ENCOUNTER — Inpatient Hospital Stay (HOSPITAL_COMMUNITY): Payer: BC Managed Care – PPO | Admitting: Physical Therapy

## 2019-10-04 ENCOUNTER — Inpatient Hospital Stay (HOSPITAL_COMMUNITY): Payer: BC Managed Care – PPO | Admitting: Occupational Therapy

## 2019-10-04 LAB — RENAL FUNCTION PANEL
Albumin: 3.2 g/dL — ABNORMAL LOW (ref 3.5–5.0)
Anion gap: 13 (ref 5–15)
BUN: 11 mg/dL (ref 6–20)
CO2: 27 mmol/L (ref 22–32)
Calcium: 8.3 mg/dL — ABNORMAL LOW (ref 8.9–10.3)
Chloride: 96 mmol/L — ABNORMAL LOW (ref 98–111)
Creatinine, Ser: 4.96 mg/dL — ABNORMAL HIGH (ref 0.44–1.00)
GFR calc Af Amer: 10 mL/min — ABNORMAL LOW (ref >60)
GFR calc non Af Amer: 9 mL/min — ABNORMAL LOW (ref >60)
Glucose, Bld: 85 mg/dL (ref 70–99)
Phosphorus: 1.9 mg/dL — ABNORMAL LOW (ref 2.5–4.6)
Potassium: 2.9 mmol/L — ABNORMAL LOW (ref 3.5–5.1)
Sodium: 136 mmol/L (ref 135–145)

## 2019-10-04 LAB — CBC
HCT: 33.5 % — ABNORMAL LOW (ref 36.0–46.0)
Hemoglobin: 10 g/dL — ABNORMAL LOW (ref 12.0–15.0)
MCH: 23.8 pg — ABNORMAL LOW (ref 26.0–34.0)
MCHC: 29.9 g/dL — ABNORMAL LOW (ref 30.0–36.0)
MCV: 79.8 fL — ABNORMAL LOW (ref 80.0–100.0)
Platelets: 419 10*3/uL — ABNORMAL HIGH (ref 150–400)
RBC: 4.2 MIL/uL (ref 3.87–5.11)
RDW: 20.2 % — ABNORMAL HIGH (ref 11.5–15.5)
WBC: 7.8 10*3/uL (ref 4.0–10.5)
nRBC: 0 % (ref 0.0–0.2)

## 2019-10-04 MED ORDER — HEPARIN SODIUM (PORCINE) 1000 UNIT/ML DIALYSIS: 2000.0000 [IU] | Freq: Once | INTRAMUSCULAR | Status: DC

## 2019-10-04 NOTE — Progress Notes (Signed)
Physical Therapy Session Note  Patient Details  Name: [name] MRN: [id number] Date of Birth: Mar 27, 1961  Today's Date: 10/04/2019 PT Individual Time: 1015-1055 PT Individual Time Calculation (min): 40 min   Short Term Goals: Week 2:  PT Short Term Goal 1 (Week 2): = to LTGs based on ELOS  Skilled Therapeutic Interventions/Progress Updates:    Pt seated in w/c upon PT arrival, agreeable to therapy tx and denies pain. Pt transported to the gym in w/c for energy conservation/time management. Pt worked on Personnel officer this session with and without RW. Pt ambulated x 85 ft with RW and CGA, pt ambulated 2 x 85 ft this session without AD min assist, cues throughout for heel strike and step length. Pt worked on dynamic standing balance this session to perform the following tasks - 4 x 8 ft sidestepping in each direction with B HHA, forwards/backwards ambulation 2 x 10 ft with B HHA and then mini squats on airex 2 x 10 without UE support. Pt transported back to room at end of session and left in w/c with needs in reach and chair alarm set.   Therapy Documentation Precautions:  Precautions Precautions: Fall Precaution Comments: L hemiparesis/watch BP Restrictions Weight Bearing Restrictions: No    Therapy/Group: Individual Therapy  Netta Corrigan, PT, DPT, CSRS 10/04/2019, 8:40 AM

## 2019-10-04 NOTE — Progress Notes (Signed)
Occupational Therapy Session Note  Patient Details  Name: [name] MRN: [id number] Date of Birth: 1960-07-28  Today's Date: 10/04/2019 OT Individual Time: 0705-0800 and 1100-1200 OT Individual Time Calculation (min): 55 min and 60 mins   Short Term Goals: Week 2:  OT Short Term Goal 1 (Week 2): STG = LTG 2/2 ELOS  Skilled Therapeutic Interventions/Progress Updates:   Session 1: Upon entering the room, pt supine in bed and sleeping soundly. Multiple attempts made to wake pt for therapeutic intervention. Pt finally arouse from sleep but taking increased time to become more alert. Pt declined toileting needs and OT reviewed schedule with pt and plans made for self care tasks late morning. Pt requesting to remain in bed and OT began energy conservation education and discharge recommendations. Pt very active participant in education and asking appropriate questions. Pt did demonstrate ability to open come items from food tray with B UEs and increased time. Bed alarm activated and call bell within reach upon exiting the room.   Session 2: Upon entering the room, pt seated in wheelchair with no c/o pain but pt is fatigued from previous therapy sessions. Pt requests to use bathroom and stands from wheelchair while ambulating with RW into bathroom at supervision level. Pt able to manage clothing, hygiene, and toilet transfer with supervision overall. Pt doffs clothing while seated on commode and transfers to sit on TTB for bathing tasks. Pt performed lateral leans to wash buttocks and figure four position to wash B LE. Pt donning clothing items from seated position in bathroom with supervision overall. Pt transferred back into bed with supervision since she has dialysis after this session. Pt repositioned self in bed with use of bed rails. Call bell and all needed items within reach. Bed alarm activated.   Therapy Documentation Precautions:  Precautions Precautions: Fall Precaution Comments: L  hemiparesis/watch BP Restrictions Weight Bearing Restrictions: No General:   Vital Signs: Therapy Vitals Temp: 98.4 F (36.9 C) Temp Source: Oral Pulse Rate: 79 Resp: 18 BP: (!) 103/59 Patient Position (if appropriate): Lying Oxygen Therapy SpO2: 100 % O2 Device: Room Air ADL: ADL Upper Body Bathing: Minimal assistance Where Assessed-Upper Body Bathing: Shower Lower Body Bathing: Moderate assistance Where Assessed-Lower Body Bathing: Shower Upper Body Dressing: Moderate assistance Where Assessed-Upper Body Dressing: Edge of bed Lower Body Dressing: Moderate assistance Where Assessed-Lower Body Dressing: Edge of bed Social research officer, government: Moderate cueing, Minimal assistance Social research officer, government Method: Stand pivot Youth worker: Transfer tub bench   Therapy/Group: Individual Therapy  Gypsy Decant 10/04/2019, 8:03 AM

## 2019-10-04 NOTE — Progress Notes (Signed)
Physical Therapy Session Note  Patient Details  Name: [name] MRN: [id number] Date of Birth: 1960-07-05  Today's Date: 10/04/2019 PT Individual Time: 0904-1000 PT Individual Time Calculation (min): 56 min   Short Term Goals: Week 2:  PT Short Term Goal 1 (Week 2): = to LTGs based on ELOS  Skilled Therapeutic Interventions/Progress Updates:    Pt received supine in bed and agreeable to therapy session. Supine>sitting R EOB, HOB partially elevated but not using bedrails, with supervision. R stand pivot to w/c, no AD, with CGA for steadying and pt relying heavily on w/c arm rests. Donned TEDs and shoes max assist for time management.  Transported to/from gym in w/c for time management and energy conservation. Gait training 154ft using RW with CGA for steadying - therapist increased height of RW to decrease anterior trunk lean - continues to demonstrate decreased L LE hip/knee flexion during swing and decreased gait speed. Standing at stairs with B UE support on HRs performed repeated L LE foot taps on 2nd step while wearing 7lb weight 2x10 reps removing weight for last 5reps due to muscle fatigue/soreness. Lateral side stepping with level 2 theraband resistance around knees x17ft each way at hallway rail for B UE support - CGA/close supervision for safety. Educated pt on proper sequencing/technique for AD management stepping on/off curb step. Pt performed stepping up/down 4" step x2 using RW with CGA for steadying and mod cuing for proper AD management. Ascended/descended 4 steps using B HRs with reciprocal pattern on ascent/descent with CGA for steadying. Pt reports fatigue. Transported back to room and left seated in w/c with needs in reach and seat belt alarm on.  Therapy Documentation Precautions:  Precautions Precautions: Fall Precaution Comments: L hemiparesis/watch BP Restrictions Weight Bearing Restrictions: No  Pain: Denies pain during session just reports some muscle  soreness.   Therapy/Group: Individual Therapy  Tawana Scale, PT, DPT 10/04/2019, 7:49 AM

## 2019-10-04 NOTE — Progress Notes (Signed)
[facility] PHYSICAL MEDICINE & REHABILITATION PROGRESS NOTE   Subjective/Complaints:  Pt reports doing well- denies pain- can lift LUE and LLE much better than before-   Review of systems -  Pt denies SOB, abd pain, CP, N/V/C/D, and vision changes   Objective:   No results found. Recent Labs    10/02/19 1617  WBC 7.7  HGB 9.6*  HCT 32.1*  PLT 391   Recent Labs    10/02/19 1617  NA 132*  K 4.6  CL 94*  CO2 26  GLUCOSE 124*  BUN 37*  CREATININE 11.74*  CALCIUM 9.6    Intake/Output Summary (Last 24 hours) at 10/04/2019 1117 Last data filed at 10/04/2019 1010 Gross per 24 hour  Intake 360 ml  Output -  Net 360 ml     Physical Exam: Vital Signs Blood pressure (!) 103/59, pulse 79, temperature 98.4 F (36.9 C), temperature source Oral, resp. rate 18, height 5' 9.02" (1.753 m), weight 105 kg, SpO2 100 %.   General: laying in bed- appropriate, NAD Mood and affect are appropriate Heart: no JVD Lungs: no accessory muscle use; no resp distress Abdomen:nondistended Extremities: No clubbing, cyanosis, or edema Skin: No evidence of breakdown, no evidence of rash  Neurologic:, motor strength is 5/5 in Right 4-/5 Left  deltoid, bicep, tricep, grip, 4- hip flexor, knee extensors, ankle dorsiflexor and plantar flexor Was able to lift LUE in abduction and flexion of shoulder- easily and HF 4+/5 as well- lifting leg against resistance.  Sensory exam normal sensation to light touch and proprioception in bilateral upper and lower extremities Good fine motor No dysmetria with L FNF but moving very slowly  Musculoskeletal: No pain with upper limb or lower limb active assistive range of motion   Assessment/Plan: 1. 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  Physiatrist is providing close team supervision and 24 hour management of active medical problems listed below.  Physiatrist and rehab team continue to assess barriers to discharge/monitor patient progress toward functional and medical goals  Care Tool:  Bathing    Body parts bathed by patient: Left arm, Chest, Abdomen, Front perineal area, Right upper leg, Left upper leg, Face, Right lower leg, Buttocks, Left lower leg, Right arm   Body parts bathed by helper: Right arm     Bathing assist Assist Level: Contact Guard/Touching assist     Upper Body Dressing/Undressing Upper body dressing   What is the patient wearing?: Pull over shirt    Upper body assist Assist Level: Set up assist    Lower Body Dressing/Undressing Lower body dressing      What is the patient wearing?: Pants     Lower body assist Assist for lower body dressing: Supervision/Verbal cueing     Toileting Toileting Toileting Activity did not occur (Clothing management and hygiene only): N/A (no void or bm)  Toileting assist Assist for toileting: Contact Guard/Touching assist     Transfers Chair/bed transfer  Transfers assist     Chair/bed transfer assist level: Contact Guard/Touching assist     Locomotion Ambulation   Ambulation assist      Assist level: Contact Guard/Touching assist Assistive device: Walker-rolling Max distance: 100 ft   Walk 10 feet activity   Assist     Assist level: Contact Guard/Touching assist Assistive device: Walker-rolling  Walk 50 feet activity   Assist Walk 50 feet with 2 turns activity did not occur: Safety/medical concerns  Assist level: Contact Guard/Touching assist Assistive device: Walker-rolling    Walk 150 feet activity   Assist Walk 150 feet activity did not occur: Safety/medical concerns         Walk 10 feet on uneven surface  activity   Assist     Assist level: Moderate Assistance - Patient - 50 - 74% Assistive  device: Hand held assist, Other (comment)(R hand rail)   Wheelchair     Assist Will patient use wheelchair at discharge?: No             Wheelchair 50 feet with 2 turns activity    Assist            Wheelchair 150 feet activity     Assist          Blood pressure (!) 103/59, pulse 79, temperature 98.4 F (36.9 C), temperature source Oral, resp. rate 18, height 5' 9.02" (1.753 m), weight 105 kg, SpO2 100 %.  Medical Problem List and Plan: 1.Left-sided weakness and dysarthriasecondary to 2 small infarcts left cerebellar deep nucleusand posterior to the atrium of the right lateral ventricle most likely separate small vessel disease. Plan 30-day cardiac event monitor as outpatient Unusual presentation- bradykinesia without tremor Cerebellar infarct not causing dysmetria  -patient may shower -ELOS/Goals: 2-2.5 weeks; goals supervision to mod I   2. Antithrombotics: -DVT/anticoagulation:Subcutaneous heparin- no lovenox due to HD, may d/c once amb 100' -antiplatelet therapy: Aspirin and Plavix x3 weeks then Plavix alone 3. Pain Management:Tylenol as needed 4. Mood:Provide emotional support -antipsychotic agents: N/A 5. Neuropsych: This patientiscapable of making decisions on herown behalf. 6. Skin/Wound Care:Routine skin checks 7. Fluids/Electrolytes/Nutrition:Routine in and outs with follow-up chemistries- Hyponatremia, nephro to manage with HD  8. End-stage renal disease. Continue hemodialysis as directed by Nephro 9. Hyperlipidemia. Lipitor 10. Diet controlled diabetes mellitus. Hemoglobin A1c 6.7. Blood sugar checks currently discontinued  11. Permissive hypertension/tachycardia. Lopressor initiated 50mg  twice daily 09/22/2019. Monitor with increased mobility and titrate as required.   Vitals:   10/04/19 0525 10/04/19 0732  BP: (!) 99/58 (!) 103/59  Pulse: 85 79  Resp: 18   Temp:  98.4 F (36.9 C)   SpO2: 100%   5/30- controlled in sitting but has orthostatic drops in therapy , add thigh hi TEDs abd binder , notified Nephro PA , HD adjusted, no drop with sit to stand this am 5/31- BP soft- 99-103- 50s- denies dizziness- but laying in bed- will check with PT about orthostatic hypotension 12. UTI,   5/22- Last WBC was 17.2- will recheck in AM- no urine Cx was able to be gotten by nursing- will con't to try - on no ABX- will monitor  5/23- Nursing got Cx- is staph capitus >100k- WBC is down to 12.2 from >17k- afebrile; pt feels good- sens to oxacillin, 7d course amoxil    LOS: 11 days A FACE TO FACE EVALUATION WAS PERFORMED  Megan Lovorn 10/04/2019, 11:17 AM

## 2019-10-05 ENCOUNTER — Inpatient Hospital Stay (HOSPITAL_COMMUNITY): Payer: BC Managed Care – PPO | Admitting: Occupational Therapy

## 2019-10-05 ENCOUNTER — Other Ambulatory Visit: Payer: Self-pay | Admitting: Cardiology

## 2019-10-05 ENCOUNTER — Inpatient Hospital Stay (HOSPITAL_COMMUNITY): Payer: BC Managed Care – PPO | Admitting: Physical Therapy

## 2019-10-05 ENCOUNTER — Inpatient Hospital Stay (HOSPITAL_COMMUNITY): Payer: BC Managed Care – PPO | Admitting: Speech Pathology

## 2019-10-05 DIAGNOSIS — I639 Cerebral infarction, unspecified: Secondary | ICD-10-CM

## 2019-10-05 MED ORDER — CHLORHEXIDINE GLUCONATE CLOTH 2 % EX PADS
6.0000 | MEDICATED_PAD | Freq: Every day | CUTANEOUS | Status: DC
  Administered 2019-10-06 – 2019-10-07 (×2): 6 via TOPICAL

## 2019-10-05 NOTE — Progress Notes (Signed)
[facility] Progress Note  Subjective: alert no c/o's, doing well  Vitals:   10/04/19 1830 10/04/19 1846 10/04/19 1950 10/05/19 0531  BP: 125/62 (!) 151/84 (!) 134/59 (!) 113/51  Pulse: 85 84 91 92  Resp:  17 18 18   Temp:  98.2 F (36.8 C) 97.8 F (36.6 C) 98.7 F (37.1 C)  TempSrc:  Oral  Oral  SpO2:  99% 100% 98%  Weight:  103.2 kg  103.3 kg  Height:        Exam: General:alert,NAD Heart:RRR, no murmur Lungs:CTAB Abdomen:bs +,soft, non-tender, ND Extremities:no LE edema Dialysis Access:LUE AVF + bruit  Dialysis Orders: MWF [facility] 4h 400/800 105.5kg 3K/2.25 bath UFP #3 LUE AVF Hep 2000 - Hectoral 1mcg Iv q HD - Mircera 237mcg IV q 2 weeks (last 5/10) - Venofer 100 x 10 ordered --> for Hgb 8.5, tsat 7% on 5/10.  Problem/Plan: 1. Acute L cerebellar + subcentimeter deep white matter R ZES:[id number] L sided weakness(has improved over the week ) and facial droop improving. Echo normal. CTA without carotid or posterior circulation stenoses. Prior CVA on imaging that she was unaware of.Neuro followed, plan is for 30d event monitor to r/o A-fib.///transitioned to CIR  2. ESRD:Continue HD MWF. HD tomorrow.  3. Hypertension/volume:sp permissiveHTN w/ acute CVA.BP now controlled/low sided. Below EDW. on metoprolol50 mg bid have decr to 12.5mg  bid and ,decr uf With Hd,no excess volume on exam / below edw ~2 kg  4. Anemia(ESRD + IDA):Hgb10.2> 9.9>9.3>9.6 . 200 aranespHd 5/26//,On high dose ESA as outpatient. 5. Metabolic bone disease:Corec Ca10.3  [name] 4  Not given  Use 2.0 ca bath ,  Phos 3.7-resumedhome binder Lorin Picket). 6. T2DM- per admit 7. Hyperlipidemia: Prev on statin - off for unknown time, resumed here. 8. Leukocytosis:WBC16.9risingand now down to 12>11.3>8.3>7.7 , asymptomatic UTI = Staph .>100,000 with Admit team rx Amoxlast day today  9. Gastroparesis: Chronic issue.no issue today     [name]  [name] 10/05/2019, 1:03 PM   Recent Labs  Lab 10/02/19 1617 10/04/19 1648  K 4.6 2.9*  BUN 37* 11  CREATININE 11.74* 4.96*  CALCIUM 9.6 8.3*  PHOS 3.7 1.9*  HGB 9.6* 10.0*   Inpatient medications: . aspirin EC  81 mg Oral Daily  . atorvastatin  40 mg Oral Daily  . clopidogrel  75 mg Oral Daily  . darbepoetin (ARANESP) injection - DIALYSIS  200 mcg Intravenous Q Wed-HD  . feeding supplement (NEPRO CARB STEADY)  237 mL Oral TID BM  . ferric citrate  420 mg Oral TID WC  . heparin  5,000 Units Subcutaneous Q8H  . metoprolol tartrate  12.5 mg Oral BID  . multivitamin  1 tablet Oral QHS  . ondansetron  4 mg Oral Q12H    acetaminophen **OR** acetaminophen, polyethylene glycol, sorbitol

## 2019-10-05 NOTE — Progress Notes (Addendum)
[facility] PHYSICAL MEDICINE & REHABILITATION PROGRESS NOTE   Subjective/Complaints:  No issues overnite , discussed low K+   Review of systems -  Pt denies SOB, abd pain, CP, N/V/C/D, and vision changes   Objective:   No results found. Recent Labs    10/02/19 1617 10/04/19 1648  WBC 7.7 7.8  HGB 9.6* 10.0*  HCT 32.1* 33.5*  PLT 391 419*   Recent Labs    10/02/19 1617 10/04/19 1648  NA 132* 136  K 4.6 2.9*  CL 94* 96*  CO2 26 27  GLUCOSE 124* 85  BUN 37* 11  CREATININE 11.74* 4.96*  CALCIUM 9.6 8.3*    Intake/Output Summary (Last 24 hours) at 10/05/2019 0729 Last data filed at 10/04/2019 1846 Gross per 24 hour  Intake 462 ml  Output 2000 ml  Net -1538 ml     Physical Exam: Vital Signs Blood pressure (!) 113/51, pulse 92, temperature 98.7 F (37.1 C), temperature source Oral, resp. rate 18, height 5' 9.02" (1.753 m), weight 103.3 kg, SpO2 98 %.   General: laying in bed- appropriate, NAD Mood and affect are appropriate Heart: no JVD Lungs: no accessory muscle use; no resp distress Abdomen:nondistended Extremities: No clubbing, cyanosis, or edema Skin: No evidence of breakdown, no evidence of rash  Neurologic:, motor strength is 5/5 in Right 4-/5 Left  deltoid, bicep, tricep, grip, 4- hip flexor, knee extensors, ankle dorsiflexor and plantar flexor Was able to lift LUE in abduction and flexion of shoulder- easily and HF 4+/5 as well- lifting leg against resistance.  Sensory exam normal sensation to light touch and proprioception in bilateral upper and lower extremities Good fine motor No dysmetria with L FNF but moving very slowly  Musculoskeletal: No pain with upper limb or lower limb active assistive range of motion   Assessment/Plan: 1. 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 also had a left cerebellar stroke but no significant limb ataxia which require 3+ hours per day of interdisciplinary therapy  in a comprehensive inpatient rehab setting.  Physiatrist is providing close team supervision and 24 hour management of active medical problems listed below.  Physiatrist and rehab team continue to assess barriers to discharge/monitor patient progress toward functional and medical goals  Care Tool:  Bathing    Body parts bathed by patient: Left arm, Chest, Abdomen, Front perineal area, Right upper leg, Left upper leg, Face, Right lower leg, Buttocks, Left lower leg, Right arm   Body parts bathed by helper: Right arm     Bathing assist Assist Level: Supervision/Verbal cueing     Upper Body Dressing/Undressing Upper body dressing   What is the patient wearing?: Pull over shirt    Upper body assist Assist Level: Supervision/Verbal cueing    Lower Body Dressing/Undressing Lower body dressing      What is the patient wearing?: Pants     Lower body assist Assist for lower body dressing: Supervision/Verbal cueing     Toileting Toileting Toileting Activity did not occur (Clothing management and hygiene only): N/A (no void or bm)  Toileting assist Assist for toileting: Supervision/Verbal cueing     Transfers Chair/bed transfer  Transfers assist     Chair/bed transfer assist level: Contact Guard/Touching assist     Locomotion Ambulation   Ambulation assist      Assist level: Contact Guard/Touching assist Assistive device: Walker-rolling Max distance: 164ft   Walk 10 feet activity   Assist     Assist level: Contact Guard/Touching assist  Assistive device: Walker-rolling   Walk 50 feet activity   Assist Walk 50 feet with 2 turns activity did not occur: Safety/medical concerns  Assist level: Contact Guard/Touching assist Assistive device: Walker-rolling    Walk 150 feet activity   Assist Walk 150 feet activity did not occur: Safety/medical concerns  Assist level: Contact Guard/Touching assist Assistive device: Walker-rolling    Walk 10 feet on  uneven surface  activity   Assist     Assist level: Moderate Assistance - Patient - 50 - 74% Assistive device: Hand held assist, Other (comment)(R hand rail)   Wheelchair     Assist Will patient use wheelchair at discharge?: No             Wheelchair 50 feet with 2 turns activity    Assist            Wheelchair 150 feet activity     Assist          Blood pressure (!) 113/51, pulse 92, temperature 98.7 F (37.1 C), temperature source Oral, resp. rate 18, height 5' 9.02" (1.753 m), weight 103.3 kg, SpO2 98 %.  Medical Problem List and Plan: 1.Left-sided weakness and dysarthriasecondary to 2 small infarcts left cerebellar deep nucleusand posterior to the atrium of the right lateral ventricle most likely separate small vessel disease. Plan 30-day cardiac event monitor as outpatient Unusual presentation- bradykinesia without tremor Cerebellar infarct not causing dysmetria  -patient may shower -ELOS/Goals: 2-2.5 weeks; goals supervision to mod I   2. Antithrombotics: -DVT/anticoagulation:Subcutaneous heparin- no lovenox due to HD, may d/c once amb 100' -antiplatelet therapy: Aspirin and Plavix x3 weeks then Plavix alone 3. Pain Management:Tylenol as needed 4. Mood:Provide emotional support -antipsychotic agents: N/A 5. Neuropsych: This patientiscapable of making decisions on herown behalf. 6. Skin/Wound Care:Routine skin checks 7. Fluids/Electrolytes/Nutrition:Routine in and outs with follow-up chemistries- Hyponatremia, nephro to manage with HD  8. End-stage renal disease. Continue hemodialysis as directed by Nephro 9. Hyperlipidemia. Lipitor 10. Diet controlled diabetes mellitus. Hemoglobin A1c 6.7. Blood sugar checks currently discontinued  11. Permissive hypertension/tachycardia. Lopressor initiated 50mg  twice daily 09/22/2019. Monitor with increased mobility and titrate as  required.   Vitals:   10/04/19 1950 10/05/19 0531  BP: (!) 134/59 (!) 113/51  Pulse: 91 92  Resp: 18 18  Temp: 97.8 F (36.6 C) 98.7 F (37.1 C)  SpO2: 100% 98%  BP controlled 6/1 5/31- BP soft- 99-103- 50s- denies dizziness- but laying in bed- will check with PT about orthostatic hypotension 12. UTI,   5/22- Last WBC was 17.2- will recheck in AM- no urine Cx was able to be gotten by nursing- will con't to try - on no ABX- will monitor  5/23- Nursing got Cx- is staph capitus >100k- WBC is down to 12.2 from >17k- afebrile; pt feels good- sens to oxacillin, 7d course amoxil   13.  HypoK- per renal had HD tomorrow with labs in HD LOS: 12 days A FACE TO [place] E Yerick Eggebrecht 10/05/2019, 7:29 AM

## 2019-10-05 NOTE — Progress Notes (Signed)
Speech Language Pathology Daily Session Note  Patient Details  Name: [name] MRN: [id number] Date of Birth: 15-Jan-1961  Today's Date: 10/05/2019 SLP Individual Time: 6160-7371 SLP Individual Time Calculation (min): 43 min  Short Term Goals: Week 2: SLP Short Term Goal 1 (Week 2): STG=LTG due to remaining LOS  Skilled Therapeutic Interventions: Pt was seen for skilled ST targeting cognition. SLP facilitated session with Min A verbal cues for a problem solving strategy and organization throughout a complex daily scheduling activity, decreased Supervision A verbal cues for recall within task. Pt alternated attention between conversational tasks and structured written activities with no more than Supervision A. In functional conversation she also anticipated ADLs she would need assistance with and home equipment needs with Supervision A verbal question cues. Pt left laying in bed with alarm set and needs within reach. Continue per current plan of care.        Pain Pain Assessment Pain Scale: 0-10 Pain Score: 0-No pain  Therapy/Group: Individual Therapy  [name] 10/05/2019, 6:59 AM

## 2019-10-05 NOTE — Discharge Summary (Addendum)
Physician Discharge Summary  Patient ID: CLOVIS WARWICK MRN: [id number] DOB/AGE: Jun 12, 1960 59 y.o.  Admit date: 09/23/2019 Discharge date: 10/12/2019  Discharge Diagnoses:  Principal Problem:   Cerebellar cerebrovascular accident (CVA) without late effect DVT prophylaxis End-stage renal disease Hyperlipidemia Diet-controlled diabetes mellitus Permissive hypertension/tachycardia UTI New left ACA infarction   Discharged Condition: Stable  Significant Diagnostic Studies: CT Code Stroke CTA Head W/WO contrast  Result Date: 09/18/2019 CLINICAL DATA:  Acute presentation with left sided weakness and left facial droop. Slurred speech. EXAM: CT ANGIOGRAPHY HEAD AND NECK TECHNIQUE: Multidetector CT imaging of the head and neck was performed using the standard protocol during bolus administration of intravenous contrast. Multiplanar CT image reconstructions and MIPs were obtained to evaluate the vascular anatomy. Carotid stenosis measurements (when applicable) are obtained utilizing NASCET criteria, using the distal internal carotid diameter as the denominator. CONTRAST:  Not annotated. COMPARISON:  Head CT same day FINDINGS: CTA NECK FINDINGS Aortic arch: Aortic atherosclerosis. Branching pattern is normal without origin stenosis. Right carotid system: Common carotid artery widely patent to the bifurcation. Minimal atherosclerotic plaque at the carotid bifurcation and ICA bulb but no stenosis. Cervical ICA widely patent. Left carotid system: Common carotid artery widely patent to the bifurcation. Mild atherosclerotic plaque at the ICA bulb. Minimal diameter 3.5 mm. Compared to a more distal cervical ICA diameter of 4 mm, this indicates a 10-20% stenosis. Vertebral arteries: Both vertebral arteries are patent through the cervical region. Right vertebral artery takes an early origin from the proximal subclavian. Skeleton: Ordinary mid cervical spondylosis. Other neck: No mass or lymphadenopathy. Upper chest:  Normal Review of the MIP images confirms the above findings CTA HEAD FINDINGS Anterior circulation: Both internal carotid arteries are patent through the skull base and siphon regions. There is mild atherosclerotic calcification in the carotid siphon regions but no stenosis greater than 30%. The anterior and middle cerebral vessels are patent without proximal stenosis, aneurysm or vascular malformation. No large or medium vessel occlusion. Posterior circulation: Both vertebral arteries widely patent to the basilar. No basilar stenosis. Posterior circulation branch vessels are normal. Patent posterior communicating arteries. Venous sinuses: Normal Anatomic variants: None Review of the MIP images confirms the above findings IMPRESSION: No acute large or medium vessel occlusion. Aortic Atherosclerosis (ICD10-I70.0). Atherosclerotic change at both carotid bifurcations. No stenosis on the right. 10-20% ICA bulb stenosis on the left. Ordinary atherosclerotic change in the carotid siphon regions but without stenosis greater than 30%. Electronically Signed   By: Nelson Chimes M.D.   On: 09/18/2019 12:51   DG Chest 1 View  Result Date: 09/20/2019 CLINICAL DATA:  Acute coronary syndrome. EXAM: CHEST  1 VIEW COMPARISON:  04/20/2019 FINDINGS: Cardiac silhouette is mildly enlarged. No mediastinal or hilar masses. No evidence of adenopathy. Clear lungs.  No pleural effusion or pneumothorax. Skeletal structures are grossly intact. IMPRESSION: 1. No acute cardiopulmonary disease. 2. Mild cardiomegaly. Electronically Signed   By: Lajean Manes M.D.   On: 09/20/2019 15:25   CT Code Stroke CTA Neck W/WO contrast  Result Date: 09/18/2019 CLINICAL DATA:  Acute presentation with left sided weakness and left facial droop. Slurred speech. EXAM: CT ANGIOGRAPHY HEAD AND NECK TECHNIQUE: Multidetector CT imaging of the head and neck was performed using the standard protocol during bolus administration of intravenous contrast.  Multiplanar CT image reconstructions and MIPs were obtained to evaluate the vascular anatomy. Carotid stenosis measurements (when applicable) are obtained utilizing NASCET criteria, using the distal internal carotid diameter as the denominator. CONTRAST:  Not  annotated. COMPARISON:  Head CT same day FINDINGS: CTA NECK FINDINGS Aortic arch: Aortic atherosclerosis. Branching pattern is normal without origin stenosis. Right carotid system: Common carotid artery widely patent to the bifurcation. Minimal atherosclerotic plaque at the carotid bifurcation and ICA bulb but no stenosis. Cervical ICA widely patent. Left carotid system: Common carotid artery widely patent to the bifurcation. Mild atherosclerotic plaque at the ICA bulb. Minimal diameter 3.5 mm. Compared to a more distal cervical ICA diameter of 4 mm, this indicates a 10-20% stenosis. Vertebral arteries: Both vertebral arteries are patent through the cervical region. Right vertebral artery takes an early origin from the proximal subclavian. Skeleton: Ordinary mid cervical spondylosis. Other neck: No mass or lymphadenopathy. Upper chest: Normal Review of the MIP images confirms the above findings CTA HEAD FINDINGS Anterior circulation: Both internal carotid arteries are patent through the skull base and siphon regions. There is mild atherosclerotic calcification in the carotid siphon regions but no stenosis greater than 30%. The anterior and middle cerebral vessels are patent without proximal stenosis, aneurysm or vascular malformation. No large or medium vessel occlusion. Posterior circulation: Both vertebral arteries widely patent to the basilar. No basilar stenosis. Posterior circulation branch vessels are normal. Patent posterior communicating arteries. Venous sinuses: Normal Anatomic variants: None Review of the MIP images confirms the above findings IMPRESSION: No acute large or medium vessel occlusion. Aortic Atherosclerosis (ICD10-I70.0). Atherosclerotic  change at both carotid bifurcations. No stenosis on the right. 10-20% ICA bulb stenosis on the left. Ordinary atherosclerotic change in the carotid siphon regions but without stenosis greater than 30%. Electronically Signed   By: Nelson Chimes M.D.   On: 09/18/2019 12:51   MR BRAIN WO CONTRAST  Result Date: 09/20/2019 CLINICAL DATA:  59 year old female dialysis patient with code stroke presentation on 09/18/2019, unexplained altered mental status. Did not receive IV tPA. Brain MRI yesterday positive for small ski mic foci in the left cerebellum and right periatrial white matter. EXAM: MRI HEAD WITHOUT CONTRAST TECHNIQUE: Multiplanar, multiecho pulse sequences of the brain and surrounding structures were obtained without intravenous contrast. COMPARISON:  Brain MRI 09/19/2019 and earlier. FINDINGS: Brain: Small foci of restricted diffusion in the right periatrial white matter and left cerebellum persist, with the latter slightly more intense on DWI today, and with the small adjacent left cerebellar area of involvement (series 3, image 13). Associated T2 and FLAIR hyperintensity as before with no evidence of acute hemorrhage, no mass effect. No new areas of abnormal diffusion. Widespread bilateral cerebral white matter T2 and FLAIR hyperintensity with a small area of focal cortical encephalomalacia in the right superior frontal gyrus, patchy abnormal signal in the bilateral deep gray nuclei, and in the pons. Occasional chronic micro hemorrhages again noted. No midline shift, mass effect, evidence of mass lesion, ventriculomegaly, extra-axial collection or acute intracranial hemorrhage. Cervicomedullary junction and pituitary are within normal limits. Vascular: Major intracranial vascular flow voids are stable. Skull and upper cervical spine: Negative visible cervical spine. Bone marrow signal is stable and within normal limits. Sinuses/Orbits: Stable and negative. Other: Visible internal auditory structures appear  normal. Mastoids remain clear. Scalp and face soft tissues appear negative. IMPRESSION: 1. Small acute infarcts in the right periatrial white matter and left cerebellum are not significantly changed from yesterday, with no associated hemorrhage or mass effect. 2. No new acute intracranial abnormality identified. Underlying signal changes compatible with moderately advanced chronic ischemia. Electronically Signed   By: Genevie Ann M.D.   On: 09/20/2019 20:50   MR BRAIN WO  CONTRAST  Result Date: 09/19/2019 CLINICAL DATA:  Left arm and leg weakness with facial droop. Acute presentation yesterday. Negative CT evaluation. EXAM: MRI HEAD WITHOUT CONTRAST TECHNIQUE: Multiplanar, multiecho pulse sequences of the brain and surrounding structures were obtained without intravenous contrast. COMPARISON:  CT studies 09/18/2019 FINDINGS: Brain: Diffusion imaging shows a 1 cm acute to subacute infarction in the left cerebellum. Chronic small-vessel ischemic changes are present throughout the pons. Cerebral hemispheres show a subcentimeter acute infarction in the white matter posterior to the atrium of the right lateral ventricle. Chronic small-vessel ischemic changes are seen extensively throughout the cerebral hemispheric deep and subcortical white matter and basal ganglia. There is an old right frontal cortical and subcortical infarction. No mass lesion, hemorrhage, hydrocephalus or extra-axial collection. One could consider the possibility of coexistence demyelinating disease, but in a patient with diabetes, hypertension and hyperlipidemia, findings are presumed to represent small vessel disease. Vascular: Major vessels at the base of the brain show flow. Skull and upper cervical spine: Negative Sinuses/Orbits: Clear/normal Other: None IMPRESSION: Background pattern of extensive chronic small-vessel ischemic change throughout the brain as outlined above. 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 1 cm acute  infarction in the left cerebellum. Subcentimeter acute infarction in the deep white matter just posterior to the atrium of the right lateral ventricle. Neither is associated with hemorrhage or mass effect. Electronically Signed   By: Nelson Chimes M.D.   On: 09/19/2019 07:49   ECHOCARDIOGRAM COMPLETE  Result Date: 09/19/2019    ECHOCARDIOGRAM REPORT   Patient Name:   YANEL DOMBROSKY Date of Exam: 09/19/2019 Medical Rec #:  [id number]    Height:       67.0 in Accession #:    [id number]   Weight:       235.7 lb Date of Birth:  January 09, 1961    BSA:          2.168 m Patient Age:    69 years     BP:           119/66 mmHg Patient Gender: F            HR:           89 bpm. Exam Location:  Inpatient Procedure: 2D Echo, Cardiac Doppler and Color Doppler Indications:    Stroke 434.91/I163.9  History:        Patient has prior history of Echocardiogram examinations, most                 recent 06/22/2019. Risk Factors:Hypertension, Diabetes, Former                 Smoker, Dyslipidemia and Sleep Apnea. ESRD.  Sonographer:    Clayton Lefort RDCS (AE) Referring Phys: [place]  1. Left ventricular ejection fraction, by estimation, is 55 to 60%. The left ventricle has normal function. The left ventricle has no regional wall motion abnormalities. There is moderate asymmetric left ventricular hypertrophy of the basal and septal segments. Left ventricular diastolic parameters are consistent with Grade I diastolic dysfunction (impaired relaxation).  2. Right ventricular systolic function is normal. The right ventricular size is normal. There is normal pulmonary artery systolic pressure.  3. The mitral valve is normal in structure. No evidence of mitral valve regurgitation. No evidence of mitral stenosis.  4. The aortic valve is tricuspid. Aortic valve regurgitation is not visualized. Mild to moderate aortic valve sclerosis/calcification is present, without any evidence of aortic stenosis.  5. The inferior vena  cava is normal  in size with greater than 50% respiratory variability, suggesting right atrial pressure of 3 mmHg. FINDINGS  Left Ventricle: Left ventricular ejection fraction, by estimation, is 55 to 60%. The left ventricle has normal function. The left ventricle has no regional wall motion abnormalities. The left ventricular internal cavity size was normal in size. There is  moderate asymmetric left ventricular hypertrophy of the basal and septal segments. Left ventricular diastolic parameters are consistent with Grade I diastolic dysfunction (impaired relaxation). Right Ventricle: The right ventricular size is normal. No increase in right ventricular wall thickness. Right ventricular systolic function is normal. There is normal pulmonary artery systolic pressure. The tricuspid regurgitant velocity is 2.56 m/s, and  with an assumed right atrial pressure of 8 mmHg, the estimated right ventricular systolic pressure is 95.0 mmHg. Left Atrium: Left atrial size was normal in size. Right Atrium: Right atrial size was normal in size. Pericardium: There is no evidence of pericardial effusion. Mitral Valve: The mitral valve is normal in structure. There is mild thickening of the mitral valve leaflet(s). There is mild calcification of the mitral valve leaflet(s). Normal mobility of the mitral valve leaflets. Moderate mitral annular calcification. No evidence of mitral valve regurgitation. No evidence of mitral valve stenosis. MV peak gradient, 4.8 mmHg. The mean mitral valve gradient is 2.0 mmHg. Tricuspid Valve: The tricuspid valve is normal in structure. Tricuspid valve regurgitation is mild . No evidence of tricuspid stenosis. Aortic Valve: The aortic valve is tricuspid. Aortic valve regurgitation is not visualized. Mild to moderate aortic valve sclerosis/calcification is present, without any evidence of aortic stenosis. Aortic valve mean gradient measures 4.0 mmHg. Aortic valve peak gradient measures 8.0 mmHg. Aortic valve area, by VTI  measures 3.44 cm. Pulmonic Valve: The pulmonic valve was normal in structure. Pulmonic valve regurgitation is not visualized. No evidence of pulmonic stenosis. Aorta: The aortic root is normal in size and structure. Venous: The inferior vena cava is normal in size with greater than 50% respiratory variability, suggesting right atrial pressure of 3 mmHg. IAS/Shunts: No atrial level shunt detected by color flow Doppler.  LEFT VENTRICLE PLAX 2D LVIDd:         4.00 cm  Diastology LVIDs:         2.70 cm  LV e' lateral:   5.55 cm/s LV PW:         1.80 cm  LV E/e' lateral: 14.1 LV IVS:        1.80 cm  LV e' medial:    4.79 cm/s LVOT diam:     2.30 cm  LV E/e' medial:  16.4 LV SV:         91 LV SV Index:   42 LVOT Area:     4.15 cm  RIGHT VENTRICLE             IVC RV Basal diam:  3.50 cm     IVC diam: 1.10 cm RV S prime:     11.50 cm/s TAPSE (M-mode): 2.1 cm LEFT ATRIUM             Index       RIGHT ATRIUM           Index LA diam:        3.20 cm 1.48 cm/m  RA Area:     15.50 cm LA Vol (A2C):   75.0 ml 34.59 ml/m RA Volume:   41.70 ml  19.23 ml/m LA Vol (A4C):   47.9 ml 22.09 ml/m  LA Biplane Vol: 63.1 ml 29.10 ml/m  AORTIC VALVE AV Area (Vmax):    3.06 cm AV Area (Vmean):   3.02 cm AV Area (VTI):     3.44 cm AV Vmax:           141.00 cm/s AV Vmean:          94.900 cm/s AV VTI:            0.263 m AV Peak Grad:      8.0 mmHg AV Mean Grad:      4.0 mmHg LVOT Vmax:         104.00 cm/s LVOT Vmean:        69.000 cm/s LVOT VTI:          0.218 m LVOT/AV VTI ratio: 0.83  AORTA Ao Root diam: 3.50 cm Ao Asc diam:  3.10 cm MITRAL VALVE                TRICUSPID VALVE MV Area (PHT): 1.25 cm     TR Peak grad:   26.2 mmHg MV Peak grad:  4.8 mmHg     TR Vmax:        256.00 cm/s MV Mean grad:  2.0 mmHg MV Vmax:       1.09 m/s     SHUNTS MV Vmean:      69.7 cm/s    Systemic VTI:  0.22 m MV Decel Time: 607 msec     Systemic Diam: 2.30 cm MV E velocity: 78.50 cm/s MV A velocity: 108.00 cm/s MV E/A ratio:  0.73 Jenkins Rouge MD  Electronically signed by Jenkins Rouge MD Signature Date/Time: 09/19/2019/10:12:49 AM    Final    CT HEAD CODE STROKE WO CONTRAST  Result Date: 09/18/2019 CLINICAL DATA:  Code stroke. Acute stroke with left-sided weakness and left facial droop. Slurred speech. EXAM: CT HEAD WITHOUT CONTRAST TECHNIQUE: Contiguous axial images were obtained from the base of the skull through the vertex without intravenous contrast. COMPARISON:  01/18/2012. FINDINGS: Brain: Chronic small-vessel ischemic changes of the hemispheric white matter. 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 No sign of acute infarction, mass lesion, hemorrhage, hydrocephalus or extra-axial collection. Vascular: There is atherosclerotic calcification of the major vessels at the base of the brain. Skull: Negative Sinuses/Orbits: Clear/normal Other: None ASPECTS ([place] Stroke Program Early CT Score) - Ganglionic level infarction (caudate, lentiform nuclei, internal capsule, insula, M1-M3 cortex): 7 - Supraganglionic infarction (M4-M6 cortex): 3 Total score (0-10 with 10 being normal): 10 IMPRESSION: 1. No acute finding. Mild chronic small-vessel change of the hemispheric white matter. 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 2. ASPECTS is 10. 3. These results were communicated to Dr. Leonel Ramsay at 12:38 pmon 5/15/2021by text page via the [facility] messaging system. Electronically Signed   By: Nelson Chimes M.D.   On: 09/18/2019 12:39    Labs:  Basic Metabolic Panel: Recent Labs  Lab 10/02/19 1617 10/04/19 1648 10/06/19 1357  NA 132* 136 134*  K 4.6 2.9* 4.2  CL 94* 96* 95*  CO2 26 27 28   GLUCOSE 124* 85 243*  BUN 37* 11 23*  CREATININE 11.74* 4.96* 9.63*  CALCIUM 9.6 8.3* 9.3  PHOS 3.7 1.9* 4.1    CBC: Recent Labs  Lab 10/02/19 1617 10/04/19 1648 10/06/19 1356  WBC 7.7 7.8 6.7  HGB 9.6* 10.0* 9.3*  HCT 32.1* 33.5* 32.5*  MCV 80.5 79.8* 82.9  PLT 391 419* 350    CBG: No results for input(s): GLUCAP in  the last 168 hours.  Family history.  Mother with hypertension and diabetes mellitus.  Father with lung cancer.  Negative colon cancer, stomach cancer or esophageal cancer  Brief HPI:   [name] is a 59 y.o. right-handed female with history of diet-controlled diabetes mellitus, hypertension, end-stage renal disease with hemodialysis.  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  1 level home 5 steps to entry.  Multiple family in the area that works.  Resented 09/18/2019 with acute onset of left-sided weakness facial droop and slurred speech.  Cranial CT scan showed no acute findings.  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  CT angiogram of head and neck no large vessel or medium vessel occlusion.  Patient did not receive TPA.  MRI showed a 1 cm acute infarct in the left cerebellum.  Subcentimeter acute infarct in the deep white matter just posterior to the atrium of the right lateral ventricle.  Admission chemistries BUN 29, creatinine 7.97, troponin high-sensitivity 48-63, hemoglobin 9.5.  Echocardiogram with ejection fraction of 60% no wall motion abnormalities.  Neurology follow-up maintained on aspirin and Plavix x3 weeks then Plavix alone.  Subcutaneous heparin for DVT prophylaxis.  Plan is for 30-day cardiac event monitor as an outpatient.  Hemodialysis ongoing as per renal services.  Permissive hypertension with bouts of tachycardia had been placed on Lopressor.  Tolerating a regular diet.  Patient was admitted for a comprehensive rehab program.   Hospital Course: ZENAIDA TESAR was admitted to rehab 09/23/2019 for inpatient therapies to consist of PT, ST and OT at least three hours five days a week. Past admission physiatrist, therapy team and rehab RN have worked together to provide customized collaborative inpatient rehab.  Pertaining to patient's left cerebellar deep and posterior to the atrium  lateral ventricle infarction remained stable maintained on aspirin and Plavix x3 weeks then Plavix alone.  A follow-up MRI was completed 10/07/2019 due to some increased nausea and vomiting with dizziness showing new acute infarct of the parasagittal left frontal lobe in the ACA territory.  Follow-up neurology services no current changes advised continue Plavix therapy as well as aspirin 325 mg daily.  Patient remained on subcutaneous heparin for DVT prophylaxis no bleeding episodes during her hospital stay.  Patient to follow-up outpatient neurology services to discuss TEE and possible loop recorder.  Hemodialysis ongoing as per renal services.  Lipitor for hyperlipidemia.  Blood sugars controlled with diet.  Hemoglobin A1c 6.7.  Permissive hypertension she remained on Lopressor for bouts of tachycardia and monitor for orthostasis.  Hospital course UTI/staph capitis greater than 100,000 completing 7-day course of amoxicillin.  Denied any dysuria.   Blood pressures were monitored on TID basis and soft and monitored  Diabetes has been monitored with ac/hs CBG checks and SSI was use prn for tighter BS control.   She has made gains during rehab stay and is attending therapies  She will continue to receive follow up therapies after discharge  Rehab course: During patient's stay in rehab weekly team conferences were held to monitor patient's progress, set goals and discuss barriers to discharge. At admission, patient required minimal assist 10 feet for ambulation, minimal assist sit to stand, minimal guard sit to supine.  Max assist upper body bathing max is lower body bathing moderate assist upper body dressing max assist lower body dressing  Physical exam.  Blood pressure 177/79 pulse 124 temperature 98.2 respirations 18 oxygen saturations 100% room  air Constitutional.  Well-developed well-nourished.  Awake alert some word finding issues no acute distress HEENT Head.  Normocephalic and atraumatic Eyes.   Pupils round and reactive to light no discharge without nystagmus Neck.  Supple nontender no JVD without thyromegaly Cardiac regular rate rhythm without extra sounds or murmur heard Abdomen.  Soft nontender positive bowel sounds without rebound Respiratory effort normal no respiratory distress without wheeze Musculoskeletal.  Normal range of motion Comments.  Right upper extremity deltoids, biceps, triceps, wrist extension grip and finger abduction 5/5 Left upper extremity deltoids 2/5, biceps 3 -/5, triceps 2/5, wrist extension 2/5, grip 3/5, finger abduction 2/5 Right lower extremity 5 -/5 in hip flexors knee extension knee flexion dorsi flexion plantar flexion Left lower extremity hip flexion 2/5 knee extension and plantar flexion 3 -/5 dorsi plantar flexion 3 -/5  She  has had improvement in activity tolerance, balance, postural control as well as ability to compensate for deficits. She has had improvement in functional use RUE/LUE  and RLE/LLE as well as improvement in awareness.  Patient working with ambulation training ambulating 85 feet rolling walker contact-guard assist.  Working on dynamic standing balance.  She could don her support hose max assist for time management.  With her afternoon sessions she was able to increase her ambulation up to 195 feet rolling walker contact-guard assist.  Educated patient on proper sequencing technique for assistive device management.  Patient able to manage clothing hygiene toilet transfers with supervision overall.  Full family teaching completed plan discharge to home       Disposition: Discharged to home    Diet: Renal diet  Special Instructions: No driving smoking or alcohol  Continue hemodialysis as directed  Continue aspirin and Plavix x3 weeks total then Plavix alone  Medications at discharge 1.  Tylenol as needed 2.  Aspirin 325 mg mg p.o. daily 3.  Lipitor 40 mg p.o. daily 4.  Plavix and 75 mg p.o. daily 5.  Aranesp weekly with  hemodialysis 6.Auryxia 420 mg p.o. 3 times daily with meals 7.  toprol 12.5 mg p.o. twice daily 8.  Rena-Vite 1 tablet p.o. daily  30-35 minutes were spent completing discharge summary and discharge planning  Discharge Instructions    Ambulatory referral to Neurology   Complete by: As directed    An appointment is requested in approximately 4 weeks left cerebellar posterior atrium right lateral ventricle infarction   Ambulatory referral to Physical Medicine Rehab   Complete by: As directed    Moderate complexity follow-up 1 to 2 weeks left cerebellar post atrium right lateral ventricle infarction      Follow-up Information    Kirsteins, Luanna Salk, MD Follow up.   Specialty: Physical Medicine and Rehabilitation Why: Office to call for appointment Contact information: [place] [address] [phone]        Reesa Chew, MD Follow up.   Specialty: Internal Medicine Why: Call for appointment Contact information: [place] [address] [phone]        [facility] Follow up.   Specialty: Cardiology Why: our office will call you for a monitor for you to wear for 30 days to see if heart beat abnormality was cause of stroke.   if you have not heard by next Wed then call out office.   Contact information: [address] [place]          Signed: Lavon Paganini Kenyen Candy  10/07/2019, 4:58 AM

## 2019-10-05 NOTE — Progress Notes (Signed)
Occupational Therapy Session Note  Patient Details  Name: [name] MRN: [id number] Date of Birth: 1960-07-28  Today's Date: 10/05/2019 OT Individual Time: 6606-3016 OT Individual Time Calculation (min): 32 min    Short Term Goals: Week 2:  OT Short Term Goal 1 (Week 2): STG = LTG 2/2 ELOS  Skilled Therapeutic Interventions/Progress Updates:    Pt in the wheelchair to start the session.  She was able to complete transfer to the therapy mat in the gym with supervision using the RW.  Began with gross motor coordination task using BUEs.  Had pt sit and toss and catch the light weight volleyball with use of the rebounder.  She was able to complete with 98% accuracy.  Also had her progress to standing to complete this task as well with 100% accuracy and min guard assist.  Had her then sit on the mat and therapist had her toss and catch a lighter beach ball with both hands, moving the ball around for her to react.  She demonstrates slower reactions on the left side but still exhibited 90% accuracy with her ability to catch the ball.  Finished work on [employer] coordination with use of the [employer].  She was able to complete test in 26 seconds with the dominant right hand and 32 and 35 seconds with the non-dominant left.  Returned to the room via wheelchair at end of session with pt left sitting up in the wheelchair with the call button and phone in reach.    Therapy Documentation Precautions:  Precautions Precautions: Fall Precaution Comments: L hemiparesis/watch BP Restrictions Weight Bearing Restrictions: No  Pain: Pain Assessment Pain Scale: Faces Pain Score: 0-No pain ADL: See Care Tool Section for some details of mobility and selfcare  Therapy/Group: Individual Therapy  Iridian Reader OTR/L 10/05/2019, 3:39 PM

## 2019-10-05 NOTE — Progress Notes (Signed)
Occupational Therapy Session Note  Patient Details  Name: [name] MRN: [id number] Date of Birth: June 05, 1960  Today's Date: 10/05/2019 OT Individual Time: 0900-1000 OT Individual Time Calculation (min): 60 min    Short Term Goals: Week 2:  OT Short Term Goal 1 (Week 2): STG = LTG 2/2 ELOS  Skilled Therapeutic Interventions/Progress Updates:    Upon entering the room, pt supine in bed with no c/o pain and agreeable to OT intervention. Pt requesting to shower this session. Pt preformed bed mobility with supervision and ambulating into bathroom with supervision. Pt reports sudden urge for BM and performed clothing management and hygiene with supervision overall. Pt doffing clothing items with supervision as well before transferring onto TTB for bathing tasks. Pt bathing self while seated with lateral leans for buttocks at intermittent supervision. Pt donning UB and LB clothing item with sit <>stand  From bench with supervision as well. Figure four utilized to don B socks. Pt taking seated rest break and standing at sink for grooming tasks. Pt returning to bed secondary to fatigue. OT reviewed recommended equipment of TTB and 3 in 1 commode with pt confirming. Pt remained in bed with call bell and all needed items within reach upon exiting the room.   Therapy Documentation Precautions:  Precautions Precautions: Fall Precaution Comments: L hemiparesis/watch BP Restrictions Weight Bearing Restrictions: No ADL: ADL Upper Body Bathing: Minimal assistance Where Assessed-Upper Body Bathing: Shower Lower Body Bathing: Moderate assistance Where Assessed-Lower Body Bathing: Shower Upper Body Dressing: Moderate assistance Where Assessed-Upper Body Dressing: Edge of bed Lower Body Dressing: Moderate assistance Where Assessed-Lower Body Dressing: Edge of bed Social research officer, government: Moderate cueing, Minimal assistance Social research officer, government Method: Stand pivot Youth worker: Transfer  tub bench   Therapy/Group: Individual Therapy  Gypsy Decant 10/05/2019, 10:44 AM

## 2019-10-05 NOTE — Progress Notes (Signed)
Physical Therapy Session Note  Patient Details  Name: [name] MRN: [id number] Date of Birth: 26-Feb-1961  Today's Date: 10/05/2019 PT Individual Time: 7078-6754 PT Individual Time Calculation (min): 55 min   Short Term Goals: Week 2:  PT Short Term Goal 1 (Week 2): = to LTGs based on ELOS  Skilled Therapeutic Interventions/Progress Updates: Pt presents supine in bed and agreeable to therapy.  Pt transferred sup to sit w/ Mod I.  Pt sat EOB and donned knee-high TEDS, removed slipper socks and donned/tied shoes in Figure-4 position independently at EOB w/o LOB.  Pt transferred sit to stand w/ supervision and amb slowly to gym w/ RW and supervision/CGA w/ verbal cues for posture and walker management.  Pt amb 150'w/ 2-3 standing rest breaks.  Pt performed standing peg board activity crossing midline and using bilateral UEs simultaneously outside of BOS.  Pt had no LOB.  Pt required seated rest breaks 2/2 fatigue.  Pt amb to Nu-step w/ RW and CGA.  Pt performed x 5' at Level 4 w/ 2' rest break and then 3' at Level 2 for improved strength and endurance.  Pt amb to room w/ RW and CGA, but c/o increased fatigue.  Pt remained seated in w/c w/ chair alarm on and all needs in reach.     Therapy Documentation Precautions:  Precautions Precautions: Fall Precaution Comments: L hemiparesis/watch BP Restrictions Weight Bearing Restrictions: No General:   Vital Signs:  Pain: no c/o pain. Pain Assessment Pain Scale: 0-10 Pain Score: 0-No pain Mobility:      Therapy/Group: Individual Therapy  Ladoris Gene 10/05/2019, 12:46 PM

## 2019-10-06 ENCOUNTER — Inpatient Hospital Stay (HOSPITAL_COMMUNITY): Payer: BC Managed Care – PPO | Admitting: Speech Pathology

## 2019-10-06 ENCOUNTER — Inpatient Hospital Stay (HOSPITAL_COMMUNITY): Payer: BC Managed Care – PPO | Admitting: Occupational Therapy

## 2019-10-06 ENCOUNTER — Inpatient Hospital Stay (HOSPITAL_COMMUNITY): Payer: BC Managed Care – PPO

## 2019-10-06 LAB — CBC
HCT: 32.5 % — ABNORMAL LOW (ref 36.0–46.0)
Hemoglobin: 9.3 g/dL — ABNORMAL LOW (ref 12.0–15.0)
MCH: 23.7 pg — ABNORMAL LOW (ref 26.0–34.0)
MCHC: 28.6 g/dL — ABNORMAL LOW (ref 30.0–36.0)
MCV: 82.9 fL (ref 80.0–100.0)
Platelets: 350 10*3/uL (ref 150–400)
RBC: 3.92 MIL/uL (ref 3.87–5.11)
RDW: 20.8 % — ABNORMAL HIGH (ref 11.5–15.5)
WBC: 6.7 10*3/uL (ref 4.0–10.5)
nRBC: 0 % (ref 0.0–0.2)

## 2019-10-06 LAB — RENAL FUNCTION PANEL
Albumin: 2.9 g/dL — ABNORMAL LOW (ref 3.5–5.0)
Anion gap: 11 (ref 5–15)
BUN: 23 mg/dL — ABNORMAL HIGH (ref 6–20)
CO2: 28 mmol/L (ref 22–32)
Calcium: 9.3 mg/dL (ref 8.9–10.3)
Chloride: 95 mmol/L — ABNORMAL LOW (ref 98–111)
Creatinine, Ser: 9.63 mg/dL — ABNORMAL HIGH (ref 0.44–1.00)
GFR calc Af Amer: 5 mL/min — ABNORMAL LOW (ref >60)
GFR calc non Af Amer: 4 mL/min — ABNORMAL LOW (ref >60)
Glucose, Bld: 243 mg/dL — ABNORMAL HIGH (ref 70–99)
Phosphorus: 4.1 mg/dL (ref 2.5–4.6)
Potassium: 4.2 mmol/L (ref 3.5–5.1)
Sodium: 134 mmol/L — ABNORMAL LOW (ref 135–145)

## 2019-10-06 MED ORDER — CLOPIDOGREL BISULFATE 75 MG PO TABS: 75.0000 mg | ORAL_TABLET | Freq: Every day | ORAL | 0 refills | Status: DC

## 2019-10-06 MED ORDER — METOPROLOL TARTRATE 25 MG PO TABS: 12.5000 mg | ORAL_TABLET | Freq: Two times a day (BID) | ORAL | 0 refills | Status: DC

## 2019-10-06 MED ORDER — ATORVASTATIN CALCIUM 40 MG PO TABS: 40.0000 mg | ORAL_TABLET | Freq: Every day | ORAL | 0 refills | Status: DC

## 2019-10-06 MED ORDER — RENA-VITE PO TABS: 1.0000 | ORAL_TABLET | Freq: Every day | ORAL | 0 refills | Status: DC

## 2019-10-06 MED ORDER — FERRIC CITRATE 1 GM 210 MG(FE) PO TABS: 420.0000 mg | ORAL_TABLET | Freq: Three times a day (TID) | ORAL | 0 refills | Status: DC

## 2019-10-06 MED ORDER — ACETAMINOPHEN 325 MG PO TABS: 650.0000 mg | ORAL_TABLET | Freq: Four times a day (QID) | ORAL | Status: DC | PRN

## 2019-10-06 MED ORDER — DARBEPOETIN ALFA 200 MCG/0.4ML IJ SOSY: 200.0000 ug | PREFILLED_SYRINGE | INTRAMUSCULAR | Status: DC

## 2019-10-06 MED ORDER — DARBEPOETIN ALFA 200 MCG/0.4ML IJ SOSY
PREFILLED_SYRINGE | INTRAMUSCULAR | Status: AC
  Filled 2019-10-06: qty 0.4

## 2019-10-06 MED ORDER — HEPARIN SODIUM (PORCINE) 1000 UNIT/ML IJ SOLN
INTRAMUSCULAR | Status: AC
  Administered 2019-10-06: 2000 [IU]
  Filled 2019-10-06: qty 2

## 2019-10-06 MED ORDER — ASPIRIN 81 MG PO TBEC: 81.0000 mg | DELAYED_RELEASE_TABLET | Freq: Every day | ORAL | Status: DC

## 2019-10-06 NOTE — Progress Notes (Addendum)
Occupational Therapy Discharge Summary  Patient Details  Name: [name] MRN: [id number] Date of Birth: 1960-11-22  Today's Date: 10/06/2019 OT Individual Time: 0800-0900 OT Individual Time Calculation (min): 60 min    Patient has met 12 of 12 long term goals due to improved activity tolerance, improved balance, postural control, ability to compensate for deficits, improved attention, improved awareness, improved coordination and weakness.  Patient to discharge at overall S - mod I level.  Pt reports multiple family members will be present during the day. Caregivers invited to come for education but did not attend this session.   Reasons goals not met: all goals met  Recommendation:  Patient will benefit from ongoing skilled OT services in outpatient setting to continue to advance functional skills in the area of BADL and iADL.  Equipment: TTB and 3 in 1 commode chair  Reasons for discharge: treatment goals met  Patient/family agrees with progress made and goals achieved: Yes   OT Intervention: Upon entering the room, pt supine in bed with no c/o pain and agreeable to OT intervention. Pt performed bed mobility independently. Pt performed sit <>stand at mod I level with RW. Pt ambulating with supervision to dresser to obtain clothing items and drape over RW to take into bathroom. Pt performed toileting needs with supervision overall and doffing clothing items while seated on commode. Pt transferred into shower with supervision and bathing with intermittent supervision. Pt drying self and dressing from TTB with intermittent supervision as well. Pt returning to sit on EOB and donning socks without assistance. Pt returning to supine secondary to fatigue. OT reviewed equipment and recommendation for outpatient OT at discharge. Pt verbalized understanding. Call bell and all needed items within reach upon exiting the room.   OT Discharge Precautions/Restrictions  Precautions Precautions:  Fall Vital Signs Therapy Vitals Temp: 98.4 F (36.9 C) Pulse Rate: 97 BP: (!) 95/53 Patient Position (if appropriate): Standing Oxygen Therapy SpO2: 100 % O2 Device: Room Air Pain Pain Assessment Pain Scale: 0-10 Pain Score: 0-No pain ADL ADL Upper Body Bathing: Minimal assistance Where Assessed-Upper Body Bathing: Shower Lower Body Bathing: Moderate assistance Where Assessed-Lower Body Bathing: Shower Upper Body Dressing: Moderate assistance Where Assessed-Upper Body Dressing: Edge of bed Lower Body Dressing: Moderate assistance Where Assessed-Lower Body Dressing: Edge of bed Social research officer, government: Moderate cueing, Minimal assistance Social research officer, government Method: Radiographer, therapeutic: Gaffer Baseline Vision/History: No visual deficits Patient Visual Report: No change from baseline Cognition Overall Cognitive Status: Impaired/Different from baseline Arousal/Alertness: Awake/alert Orientation Level: Oriented X4 Attention: Selective Focused Attention: Appears intact Sustained Attention: Appears intact Selective Attention: Appears intact Memory: Impaired Memory Impairment: Decreased short term memory Decreased Short Term Memory: Verbal basic Awareness: Impaired Awareness Impairment: Emergent impairment Problem Solving: Impaired Problem Solving Impairment: Verbal complex;Functional complex Safety/Judgment: Appears intact Sensation Sensation Light Touch: Appears Intact Hot/Cold: Appears Intact Proprioception: Appears Intact Stereognosis: Not tested Coordination Gross Motor Movements are Fluid and Coordinated: Yes Fine Motor Movements are Fluid and Coordinated: Yes Coordination and Movement Description: WFLs Motor  Motor Motor - Discharge Observations: mild apraxia improved since initial evaluation Mobility  Bed Mobility Bed Mobility: Supine to Sit;Sit to Supine;Rolling Right Rolling Right: Independent Supine to Sit:  Independent Sit to Supine: Independent Transfers Sit to Stand: Independent with assistive device Stand to Sit: Independent with assistive device  Trunk/Postural Assessment  Cervical Assessment Cervical Assessment: Within Functional Limits Thoracic Assessment Thoracic Assessment: Within Functional Limits Lumbar Assessment Lumbar Assessment: Within Functional Limits  Balance °Balance °Balance Assessed: Yes °Static Sitting Balance °Static Sitting - Level of Assistance: 7: Independent °Dynamic Sitting Balance °Dynamic Sitting - Balance Support: Feet supported °Dynamic Sitting - Level of Assistance: 7: Independent °Static Standing Balance °Static Standing - Level of Assistance: 6: Modified independent (Device/Increase time) °Dynamic Standing Balance °Dynamic Standing - Balance Support: During functional activity °Dynamic Standing - Level of Assistance: 6: Modified independent (Device/Increase time) °Extremity/Trunk Assessment °RUE Assessment °RUE Assessment: Within Functional Limits °LUE Assessment °LUE Assessment: Within Functional Limits ° ° °Bradsher, Katie P °10/06/2019, 8:28 AM °

## 2019-10-06 NOTE — Progress Notes (Addendum)
[facility] PHYSICAL MEDICINE & REHABILITATION PROGRESS NOTE   Subjective/Complaints:  No new issues overnite   Review of systems -  Pt denies SOB, abd pain, CP, N/V/C/D, and vision changes   Objective:   No results found. Recent Labs    10/04/19 1648  WBC 7.8  HGB 10.0*  HCT 33.5*  PLT 419*   Recent Labs    10/04/19 1648  NA 136  K 2.9*  CL 96*  CO2 27  GLUCOSE 85  BUN 11  CREATININE 4.96*  CALCIUM 8.3*    Intake/Output Summary (Last 24 hours) at 10/06/2019 0858 Last data filed at 10/05/2019 1700 Gross per 24 hour  Intake 376 ml  Output --  Net 376 ml     Physical Exam: Vital Signs Blood pressure (!) 95/53, pulse 97, temperature 98.4 F (36.9 C), resp. rate 16, height 5' 9.02" (1.753 m), weight 103.3 kg, SpO2 100 %.   General: laying in bed- appropriate, NAD Mood and affect are appropriate Heart: no JVD Lungs: no accessory muscle use; no resp distress Abdomen:nondistended Extremities: No clubbing, cyanosis, or edema Skin: No evidence of breakdown, no evidence of rash  Neurologic:, motor strength is 5/5 in Right 4-/5 Left  deltoid, bicep, tricep, grip, 4- hip flexor, knee extensors, ankle dorsiflexor and plantar flexor Was able to lift LUE in abduction and flexion of shoulder- easily and HF 4+/5 as well- lifting leg against resistance.  Sensory exam normal sensation to light touch and proprioception in bilateral upper and lower extremities Good fine motor No dysmetria with L FNF but moving very slowly  Musculoskeletal: No pain with upper limb or lower limb active assistive range of motion   Assessment/Plan: 1. 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  Physiatrist is providing close team supervision and 24 hour management of active  medical problems listed below.  Physiatrist and rehab team continue to assess barriers to discharge/monitor patient progress toward functional and medical goals  Care Tool:  Bathing    Body parts bathed by patient: Left arm, Chest, Abdomen, Front perineal area, Right upper leg, Left upper leg, Face, Right lower leg, Buttocks, Left lower leg, Right arm   Body parts bathed by helper: Right arm     Bathing assist Assist Level: Supervision/Verbal cueing     Upper Body Dressing/Undressing Upper body dressing   What is the patient wearing?: Pull over shirt    Upper body assist Assist Level: Supervision/Verbal cueing    Lower Body Dressing/Undressing Lower body dressing      What is the patient wearing?: Pants     Lower body assist Assist for lower body dressing: Supervision/Verbal cueing     Toileting Toileting Toileting Activity did not occur (Clothing management and hygiene only): N/A (no void or bm)  Toileting assist Assist for toileting: Supervision/Verbal cueing     Transfers Chair/bed transfer  Transfers assist     Chair/bed transfer assist level: Supervision/Verbal cueing     Locomotion Ambulation   Ambulation assist      Assist level: Supervision/Verbal cueing Assistive device: Walker-rolling Max distance: 15'   Walk 10 feet activity   Assist     Assist level: Supervision/Verbal cueing Assistive device: Walker-rolling   Walk 50 feet activity   Assist Walk 50 feet with 2 turns activity did not occur: Safety/medical concerns  Assist  level: Contact Guard/Touching assist Assistive device: Walker-rolling    Walk 150 feet activity   Assist Walk 150 feet activity did not occur: Safety/medical concerns  Assist level: Contact Guard/Touching assist Assistive device: Walker-rolling    Walk 10 feet on uneven surface  activity   Assist     Assist level: Moderate Assistance - Patient - 50 - 74% Assistive device: Hand held assist, Other  (comment)(R hand rail)   Wheelchair     Assist Will patient use wheelchair at discharge?: No             Wheelchair 50 feet with 2 turns activity    Assist            Wheelchair 150 feet activity     Assist          Blood pressure (!) 95/53, pulse 97, temperature 98.4 F (36.9 C), resp. rate 16, height 5' 9.02" (1.753 m), weight 103.3 kg, SpO2 100 %.  Medical Problem List and Plan: 1.Left-sided weakness and dysarthriasecondary to 2 small infarcts left cerebellar deep nucleusand posterior to the atrium of the right lateral ventricle most likely separate small vessel disease. Plan 30-day cardiac event monitor as outpatient Unusual presentation- bradykinesia without tremor Cerebellar infarct not causing dysmetria  -patient may shower -ELOS/Goals: 2-2.5 weeks; goals supervision to mod I   2. Antithrombotics: -DVT/anticoagulation:Subcutaneous heparin- no lovenox due to HD, may d/c once amb 100', went 150' yesterday  -antiplatelet therapy: Aspirin and Plavix x3 weeks then Plavix alone 3. Pain Management:Tylenol as needed 4. Mood:Provide emotional support -antipsychotic agents: N/A 5. Neuropsych: This patientiscapable of making decisions on herown behalf. 6. Skin/Wound Care:Routine skin checks 7. Fluids/Electrolytes/Nutrition:Routine in and outs with follow-up chemistries- Hypokalemia nephro to manage with HD  8. End-stage renal disease. Continue hemodialysis as directed by Nephro 9. Hyperlipidemia. Lipitor 10. Diet controlled diabetes mellitus. Hemoglobin A1c 6.7. Blood sugar checks currently discontinued  11. Permissive hypertension/tachycardia. Lopressor initiated 50mg  twice daily 09/22/2019. Monitor with increased mobility and titrate as required.   Vitals:   10/06/19 0536 10/06/19 0539  BP: 114/67 (!) 95/53  Pulse: 95 97  Resp:    Temp:    SpO2: 97% 100%  BP controlled 6/2 but a  little soft, monitor for dizziness when up, use TED hose  12. UTI,   5/22- Last WBC was 17.2- will recheck in AM- no urine Cx was able to be gotten by nursing- will con't to try - on no ABX- will monitor  5/23- Nursing got Cx- is staph capitus >100k- WBC is down to 12.2 from >17k- afebrile; pt feels good- sens to oxacillin, 7d course amoxil   13.  HypoK- per renal had HD tomorrow with labs in HD LOS: 13 days A FACE TO [facility] Gavina Dildine 10/06/2019, 8:58 AM

## 2019-10-06 NOTE — Progress Notes (Signed)
Physical Therapy Discharge Summary  Patient Details  Name: SHARNETTE KITAMURA MRN: [id number] Date of Birth: [birth year]  Today's Date: 10/06/2019 PT Individual Time: 1100-1200 PT Individual Time Calculation (min): 60 min    Patient has met 9 of 9 long term goals due to improved activity tolerance, improved balance, improved postural control, increased strength, and improved awareness.  Patient to discharge at an ambulatory level Supervision.   Patient's care partner is independent to provide the necessary  supervision  assistance at discharge. Pt's daughter will provide supervision for mobility including gait and steps.   All goals met.   Recommendation:  Patient will benefit from ongoing skilled PT services in outpatient setting to continue to advance safe functional mobility, address ongoing impairments in balance, strength, gait, and minimize fall risk.  Equipment: RW  Reasons for discharge: treatment goals met  Patient/family agrees with progress made and goals achieved: Yes   PT Treatment Interventions: Pt supine in bed upon PT arrival, agreeable to therapy tx and denies pain. Pt performed bed mobility this session independently. Pt seated EOB donned teds and shoes without assist. Pt ambulated x 150 ft from room>gym this session with RW and supervision. Pt ascended/descended 12 steps with supervision and use of rails, step to pattern. Pt transported to the ortho gym, pt ambulated from w/c<>care with RW and supervision to perform car transfer. Pt ambulated across ramp this session for unlevel surfaces x 10 ft with RW and supervision. Pt performed sit<>stand with RW and supervision, picked up cup off the floor with single UE support on RW and supervision. Pt transported to the dayroom and used nustep this session x 6 minutes on workload 5 for global strength and endurance. Pt transported back to room and transferred to bed Mod I. Doffed shoes/teds. Pt left supine in bed with needs in reach and  bed alarm set.   PT Discharge Precautions/Restrictions Precautions Precautions: Fall Precaution Comments: L hemiparesis/watch BP Restrictions Weight Bearing Restrictions: No Pain Pain Assessment Pain Scale: 0-10 Pain Score: 0-No pain Cognition Overall Cognitive Status: Impaired/Different from baseline Arousal/Alertness: Awake/alert Orientation Level: Oriented X4 Attention: Selective Focused Attention: Appears intact Focused Attention Impairment: Verbal complex Sustained Attention: Appears intact Sustained Attention Impairment: Functional complex Selective Attention: Appears intact Memory: Impaired Memory Impairment: Decreased short term memory Decreased Short Term Memory: Verbal basic Awareness: Impaired Awareness Impairment: Emergent impairment Problem Solving: Impaired Problem Solving Impairment: Verbal complex;Functional complex Safety/Judgment: Appears intact Sensation Sensation Light Touch: Appears Intact Hot/Cold: Appears Intact Proprioception: Appears Intact Stereognosis: Not tested Coordination Gross Motor Movements are Fluid and Coordinated: Yes Fine Motor Movements are Fluid and Coordinated: Yes Coordination and Movement Description: WFLs Motor  Motor Motor - Discharge Observations: mild apraxia improved since initial evaluation  Mobility Bed Mobility Bed Mobility: Supine to Sit;Sit to Supine;Rolling Right Rolling Right: Independent Supine to Sit: Independent Sit to Supine: Independent Transfers Transfers: Sit to Stand;Stand to Sit;Stand Pivot Transfers Sit to Stand: Independent with assistive device Stand to Sit: Independent with assistive device Stand Pivot Transfers: Independent with assistive device Stand Pivot Transfer Details: Verbal cues for sequencing;Tactile cues for sequencing;Tactile cues for initiation;Tactile cues for posture;Verbal cues for technique;Tactile cues for placement Transfer (Assistive device): Rolling walker Locomotion   Gait Ambulation: Yes Gait Assistance: Minimal Assistance - Patient > 75%;Supervision/Verbal cueing Gait Distance (Feet): 150 Feet Assistive device: Rolling walker Gait Assistance Details: Verbal cues for sequencing;Verbal cues for gait pattern;Verbal cues for technique;Tactile cues for posture Gait Gait: Yes Gait Pattern: Impaired Gait Pattern: Decreased stance time -  left;Poor foot clearance - left Gait velocity: decreased Stairs / Additional Locomotion Stairs: Yes Stairs Assistance: Supervision/Verbal cueing Stair Management Technique: Two rails Number of Stairs: 12 Height of Stairs: 6 Wheelchair Mobility Wheelchair Mobility: No  Trunk/Postural Assessment  Cervical Assessment Cervical Assessment: Within Functional Limits Thoracic Assessment Thoracic Assessment: Within Functional Limits Lumbar Assessment Lumbar Assessment: Within Functional Limits  Balance Balance Balance Assessed: Yes Static Sitting Balance Static Sitting - Level of Assistance: 7: Independent Dynamic Sitting Balance Dynamic Sitting - Balance Support: Feet supported Dynamic Sitting - Level of Assistance: 7: Independent Static Standing Balance Static Standing - Level of Assistance: 6: Modified independent (Device/Increase time) Dynamic Standing Balance Dynamic Standing - Balance Support: During functional activity Dynamic Standing - Level of Assistance: 6: Modified independent (Device/Increase time) Extremity Assessment  RLE Assessment RLE Assessment: Within Functional Limits LLE Assessment Passive Range of Motion (PROM) Comments: WFL LLE Strength Left Hip Flexion: 3+/5 Left Knee Flexion: 3+/5 Left Knee Extension: 3+/5 Left Ankle Dorsiflexion: 3+/5 Left Ankle Plantar Flexion: 3+/5    Netta Corrigan, PT, DPT, CSRS 10/06/2019, 11:03 AM

## 2019-10-06 NOTE — Patient Care Conference (Signed)
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)    Leukocytosis    Left-sided weakness    Acute ischemic stroke ([place]) 09/19/2019   Stroke ([place]) 09/18/2019   Intractable nausea and vomiting 04/20/2019   Atypical chest pain 04/02/2019   Type 2 diabetes mellitus without complications ([place]) 26/37/8588   Anemia in chronic kidney disease 02/19/2019   ESRD (end stage renal disease) ([place]) 02/19/2019   Secondary hyperparathyroidism of renal origin ([place]) 02/19/2019   Coagulation defect, unspecified ([place]) 02/19/2019   Venous (peripheral) insufficiency 08/06/2017   Encounter for screening for respiratory tuberculosis 01/01/2016   Hypertension    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 Alysia Penna Care Coodinator Present: Nestor Lewandowsky, RN, BSN, CRRN;Christina Sampson Goon, BSW Nurse Present: Rosita Fire, RN PT Present: Michaelene Song, PT OT Present: Clyda Greener, OT SLP Present: Jettie Booze, CF-SLP PPS Coordinator present : Ileana Ladd, Burna Mortimer, SLP     Current Status/Progress Goal Weekly Team Focus  Bowel/Bladder   Continent of bowel. Oliguric.  LBM 5/31  Remain continent  Assess toileting needs qshift and PRN   Swallow/Nutrition/ Hydration             ADL's   supervision overall for self care tasks and functional transfers with RW  supervision to modified independent  self care retraining, balance, functional mobility/tranfers, NMR, and pt education for discharge   Mobility   supervision for all mobility with RW up to 150 ft, supervision for steps with rail  supervision  balance, gait, education, transfers, L NMR, d/c planning   Communication             Safety/Cognition/ Behavioral Observations  Supervision-Min  Supervision  complex problem solving, recall, alternating attention, error awareness   Pain   Denies pain  remain pain free  assess pain qshift and PRN   Skin   No skin impairments  no new skin break down  assess skin qshift and PRN    Rehab Goals Patient on target to meet rehab goals: Yes Rehab Goals Revised: On target *See Care Plan and progress notes for long and short-term goals.     Barriers to Discharge  Current Status/Progress Possible Resolutions Date Resolved   Nursing  PT                    OT                  SLP                Care Coordinator Inaccessible home environment;Home environment access/layout;Lack of/limited family support Patient confirmed able to stay with daughter for couple of weeks. On target          Discharge Planning/Teaching Needs:  Goal to discharge home with children to provide care  Will schedule if needed   Team Discussion:  Potassium trending down = supplementation.  Completed abx for UTI. Memory issues improved.  On target for discharge  Revisions to Treatment Plan:  Recommended OP follow up provided daughter can provide transportation to/from sessions.    Medical Summary Current Status: amb distance improved , hypokalemia, on HD Weekly Focus/Goal: Nephro to address K+ during HD  Barriers to Discharge: Hemodialysis   Possible Resolutions to Barriers: see above coordinate d/c with HD schedule   Continued Need for Acute Rehabilitation Level of Care: The patient requires daily medical management by a physician with specialized training in physical medicine and rehabilitation for the following reasons: Direction of a multidisciplinary physical rehabilitation program to maximize functional independence : Yes Medical management of patient stability for increased activity during participation in an intensive rehabilitation regime.: Yes Analysis of laboratory values and/or radiology reports with any subsequent need for medication adjustment and/or medical intervention. : Yes   I attest that I was present, lead the team conference, and concur with the assessment and plan of the team.   Dorien Chihuahua B 10/06/2019, 2:05 PM

## 2019-10-06 NOTE — Progress Notes (Signed)
Team Conference Report to Patient/Family  Team Conference discussion was reviewed with the patient and caregiver, including goals, any changes in plan of care and target discharge date.  Patient and caregiver express understanding and are in agreement.  The patient has a target discharge date of 10/07/19.  Dyanne Iha 10/06/2019, 2:08 PM

## 2019-10-06 NOTE — Progress Notes (Signed)
Speech Language Pathology Discharge Summary  Patient Details  Name: [name] MRN: [id number] Date of Birth: [birth year]  Today's Date: 10/06/2019 SLP Individual Time: 4098-1191 SLP Individual Time Calculation (min): 55 min   Skilled Therapeutic Interventions:  Pt was seen for skilled ST targeting cognitive goals. SLP readministered Cognistat evaluation to gauge progress throughout admission. Pt demonstrated vast improvements in score today, with only mild deficits noted in calculations subtest, whereas on day of admission she demonstrated deficits in calculations, Ecologist, attention, memory, and error awareness. Pt indepedently self-corrected functional error during test X1 and throughout session with no more than Supervision A verbal cues. SLP further facilitated session with a complex meal planning activity, during which pt utilized complex problem solving, planning, and organizational strategies with Supervision A verbal cues. Pt left laying in bed with alarm set and needs within reach. Continue per current plan of care.      Patient has met 5 of 5 long term goals.  Patient to discharge at overall Supervision level.  Reasons goals not met: n/a   Clinical Impression/Discharge Summary:   Pt made excellent functional gains and met 5 out of 5 long term goals this admission. Pt currently requires Supervision assist for complex tasks and will require 24/7 supervision for greatest safety and functioning at discharge. Pt has demonstrated improved selective and even alternating attention to tasks, complex problem solving, use of compensatory memory strategies to recall new information, and error awareness. She is also 100% intelligible in conversation and using increased vocal intensity independently (suspect that decreased intensity on evaluation day was influenced by level of fatigue). Given that pt reports she is back to baseline level of cognitive-linguistic functioning and will have 24/7  supervision from her daughter at discharge, no follow up ST is recommended. No family was present for ST sessions during admission.    Care Partner:  Caregiver Able to Provide Assistance: Yes  Type of Caregiver Assistance: Cognitive  Recommendation:  24 hour supervision/assistance  Rationale for SLP Follow Up: (n/a)   Equipment: none   Reasons for discharge: Discharged from hospital   Patient/Family Agrees with Progress Made and Goals Achieved: Yes    Arbutus Leas 10/06/2019, 7:10 AM

## 2019-10-06 NOTE — Progress Notes (Signed)
Nutrition Follow-up  RD working remotely.  DOCUMENTATION CODES:   Obesity unspecified  INTERVENTION:   -Continue renal MVI daily -Continue Nepro Shake po TID, each supplement provides 425 kcal and 19 grams protein -Magic cup BID with meals, each supplement provides 290 kcal and 9 grams of protein  NUTRITION DIAGNOSIS:   Increased nutrient needs related to chronic illness as evidenced by estimated needs.  Ongoing   GOAL:   Patient will meet greater than or equal to 90% of their needs  Progressing   MONITOR:   PO intake, Supplement acceptance, Labs, Weight trends, I & O's  REASON FOR ASSESSMENT:   Malnutrition Screening Tool    ASSESSMENT:   59 year old female with PMH of diet-controlled diabetes mellitus, HTN, and ESRD on HD. Presented 09/18/19 with acute onset of left-sided weakness, facial droop, and slurred speech. MRI showed a 1 cm acute infarct in the left cerebellum. Subcentimeter acute infarct in the deep white matter just posterior to the atrium of the right lateral ventricle. Admitted to CIR on 5/20.  Reviewed I/O's: +376 ml x 24 hours and -2.7 L since admission  Attempted to speak with pt via phone, however, no answer.   Per nephrology notes, last HD on 10/04/19. She continues to tolerate well. EDW 105.5 kg.   Pt's intake is variable; noted meal completion 10-70%. Pt is mostly accepting of Nepro supplements, however, refused this morning's dose. Pt has also been consuming outside food on occasion, such as chicken pot pie brought in by daughter.   Labs reviewed: K: 2.9, Phos: 1.9.   Diet Order:   Diet Order            Diet renal/carb modified with fluid restriction Diet-HS Snack? Nothing; Fluid restriction: 1200 mL Fluid; Room service appropriate? Yes; Fluid consistency: Thin  Diet effective now              EDUCATION NEEDS:   Education needs have been addressed  Skin:  Skin Assessment: Reviewed RN Assessment  Last BM:  10/04/19  Height:   Ht  Readings from Last 1 Encounters:  09/23/19 5' 9.02" (1.753 m)    Weight:   Wt Readings from Last 1 Encounters:  10/05/19 103.3 kg    Ideal Body Weight:  65.9 kg  BMI:  Body mass index is 33.61 kg/m.  Estimated Nutritional Needs:   Kcal:  2100-2300  Protein:  110-130 grams  Fluid:  1000 ml + UOP    Loistine Chance, RD, LDN, [place] Registered Dietitian II Certified Diabetes Care and Education Specialist Please refer to [facility] for RD and/or RD on-call/weekend/after hours pager

## 2019-10-06 NOTE — Progress Notes (Signed)
Rolling walker, tub transfer bench and shower chair ordered on 6/1

## 2019-10-07 ENCOUNTER — Inpatient Hospital Stay (HOSPITAL_COMMUNITY): Payer: BC Managed Care – PPO

## 2019-10-07 LAB — COMPREHENSIVE METABOLIC PANEL
ALT: 38 U/L (ref 0–44)
AST: 33 U/L (ref 15–41)
Albumin: 3.7 g/dL (ref 3.5–5.0)
Alkaline Phosphatase: 85 U/L (ref 38–126)
Anion gap: 15 (ref 5–15)
BUN: 10 mg/dL (ref 6–20)
CO2: 27 mmol/L (ref 22–32)
Calcium: 10 mg/dL (ref 8.9–10.3)
Chloride: 93 mmol/L — ABNORMAL LOW (ref 98–111)
Creatinine, Ser: 6.76 mg/dL — ABNORMAL HIGH (ref 0.44–1.00)
GFR calc Af Amer: 7 mL/min — ABNORMAL LOW (ref >60)
GFR calc non Af Amer: 6 mL/min — ABNORMAL LOW (ref >60)
Glucose, Bld: 184 mg/dL — ABNORMAL HIGH (ref 70–99)
Potassium: 4.4 mmol/L (ref 3.5–5.1)
Sodium: 135 mmol/L (ref 135–145)
Total Bilirubin: 0.7 mg/dL (ref 0.3–1.2)
Total Protein: 8 g/dL (ref 6.5–8.1)

## 2019-10-07 LAB — CBC WITH DIFFERENTIAL/PLATELET
Abs Immature Granulocytes: 0.05 10*3/uL (ref 0.00–0.07)
Basophils Absolute: 0 10*3/uL (ref 0.0–0.1)
Basophils Relative: 0 %
Eosinophils Absolute: 0 10*3/uL (ref 0.0–0.5)
Eosinophils Relative: 0 %
HCT: 42.2 % (ref 36.0–46.0)
Hemoglobin: 12.4 g/dL (ref 12.0–15.0)
Immature Granulocytes: 1 %
Lymphocytes Relative: 7 %
Lymphs Abs: 0.6 10*3/uL — ABNORMAL LOW (ref 0.7–4.0)
MCH: 23.9 pg — ABNORMAL LOW (ref 26.0–34.0)
MCHC: 29.4 g/dL — ABNORMAL LOW (ref 30.0–36.0)
MCV: 81.5 fL (ref 80.0–100.0)
Monocytes Absolute: 0.2 10*3/uL (ref 0.1–1.0)
Monocytes Relative: 3 %
Neutro Abs: 7.3 10*3/uL (ref 1.7–7.7)
Neutrophils Relative %: 89 %
Platelets: 410 10*3/uL — ABNORMAL HIGH (ref 150–400)
RBC: 5.18 MIL/uL — ABNORMAL HIGH (ref 3.87–5.11)
RDW: 21.2 % — ABNORMAL HIGH (ref 11.5–15.5)
WBC: 8.2 10*3/uL (ref 4.0–10.5)
nRBC: 0 % (ref 0.0–0.2)

## 2019-10-07 IMAGING — DX DG ABDOMEN 1V
2 series · 2 of 2 positions shown · non-contrast
Comparison: [DATE]

CLINICAL DATA: Nausea and vomiting

EXAM:
ABDOMEN - 1 VIEW

[abdomen kub (1 of 2)]
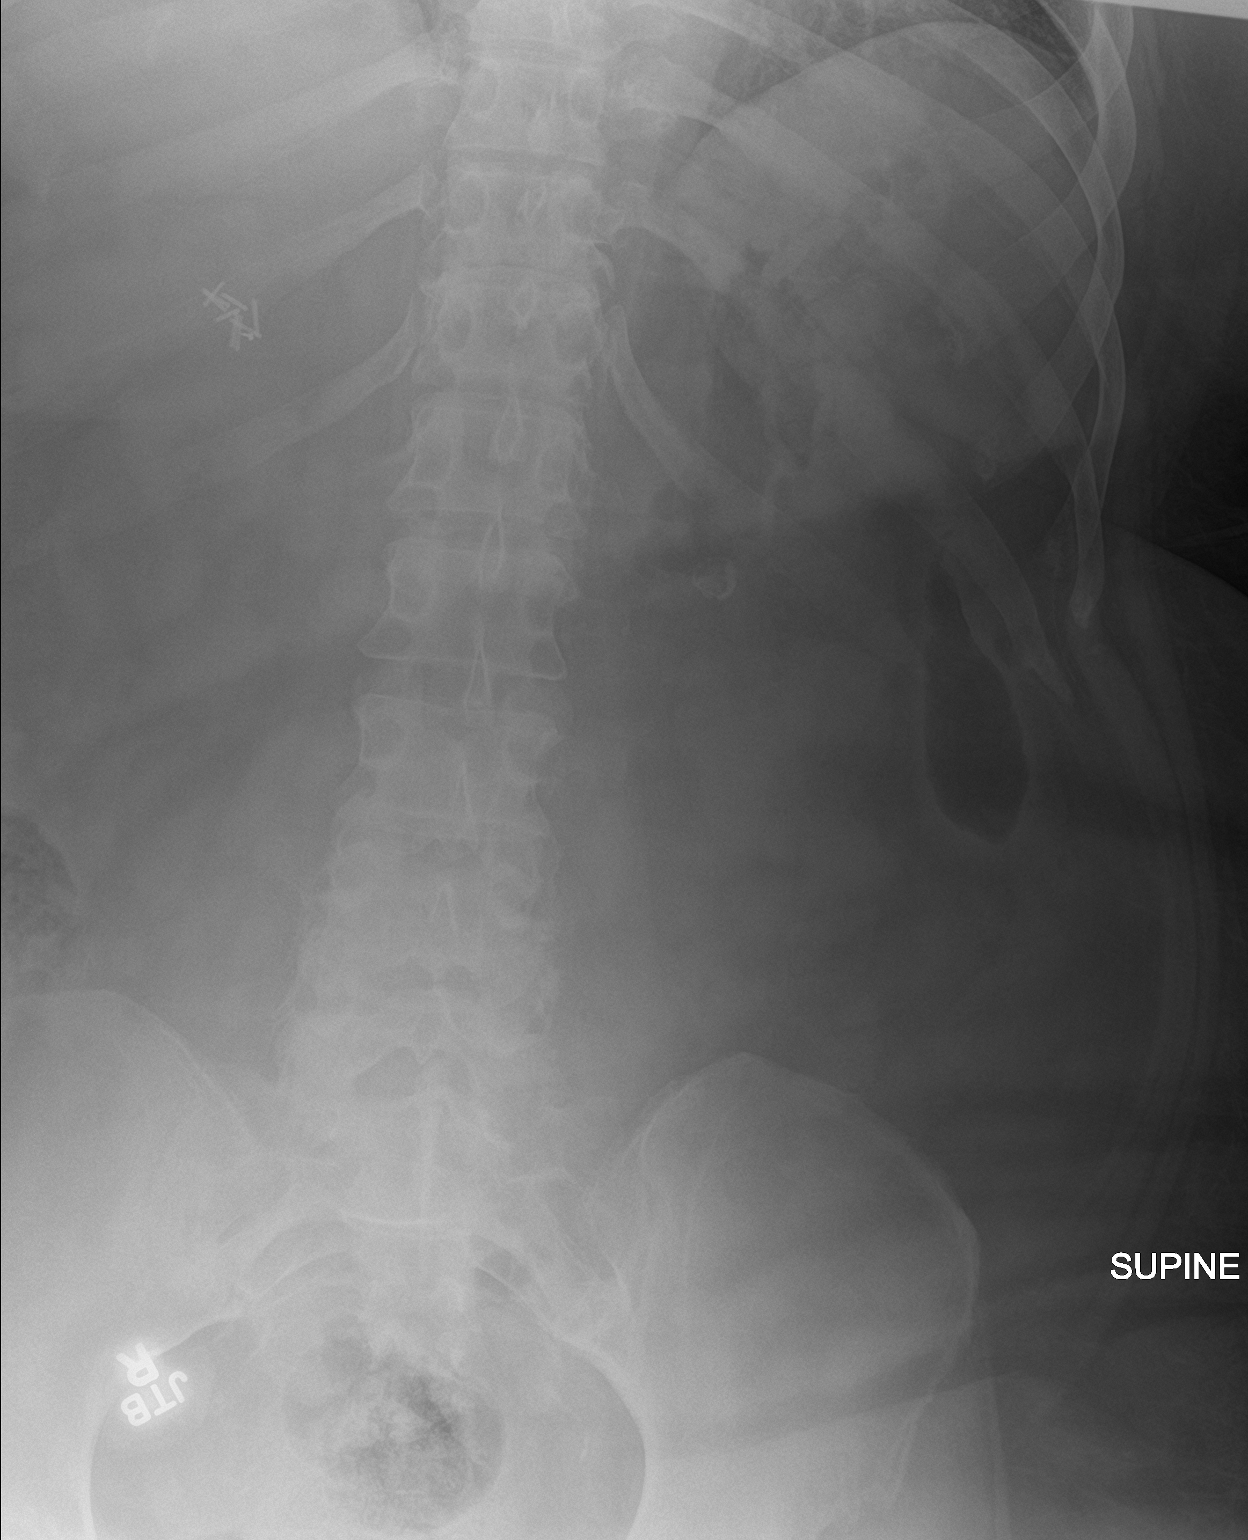

[abdomen kub (2 of 2)]
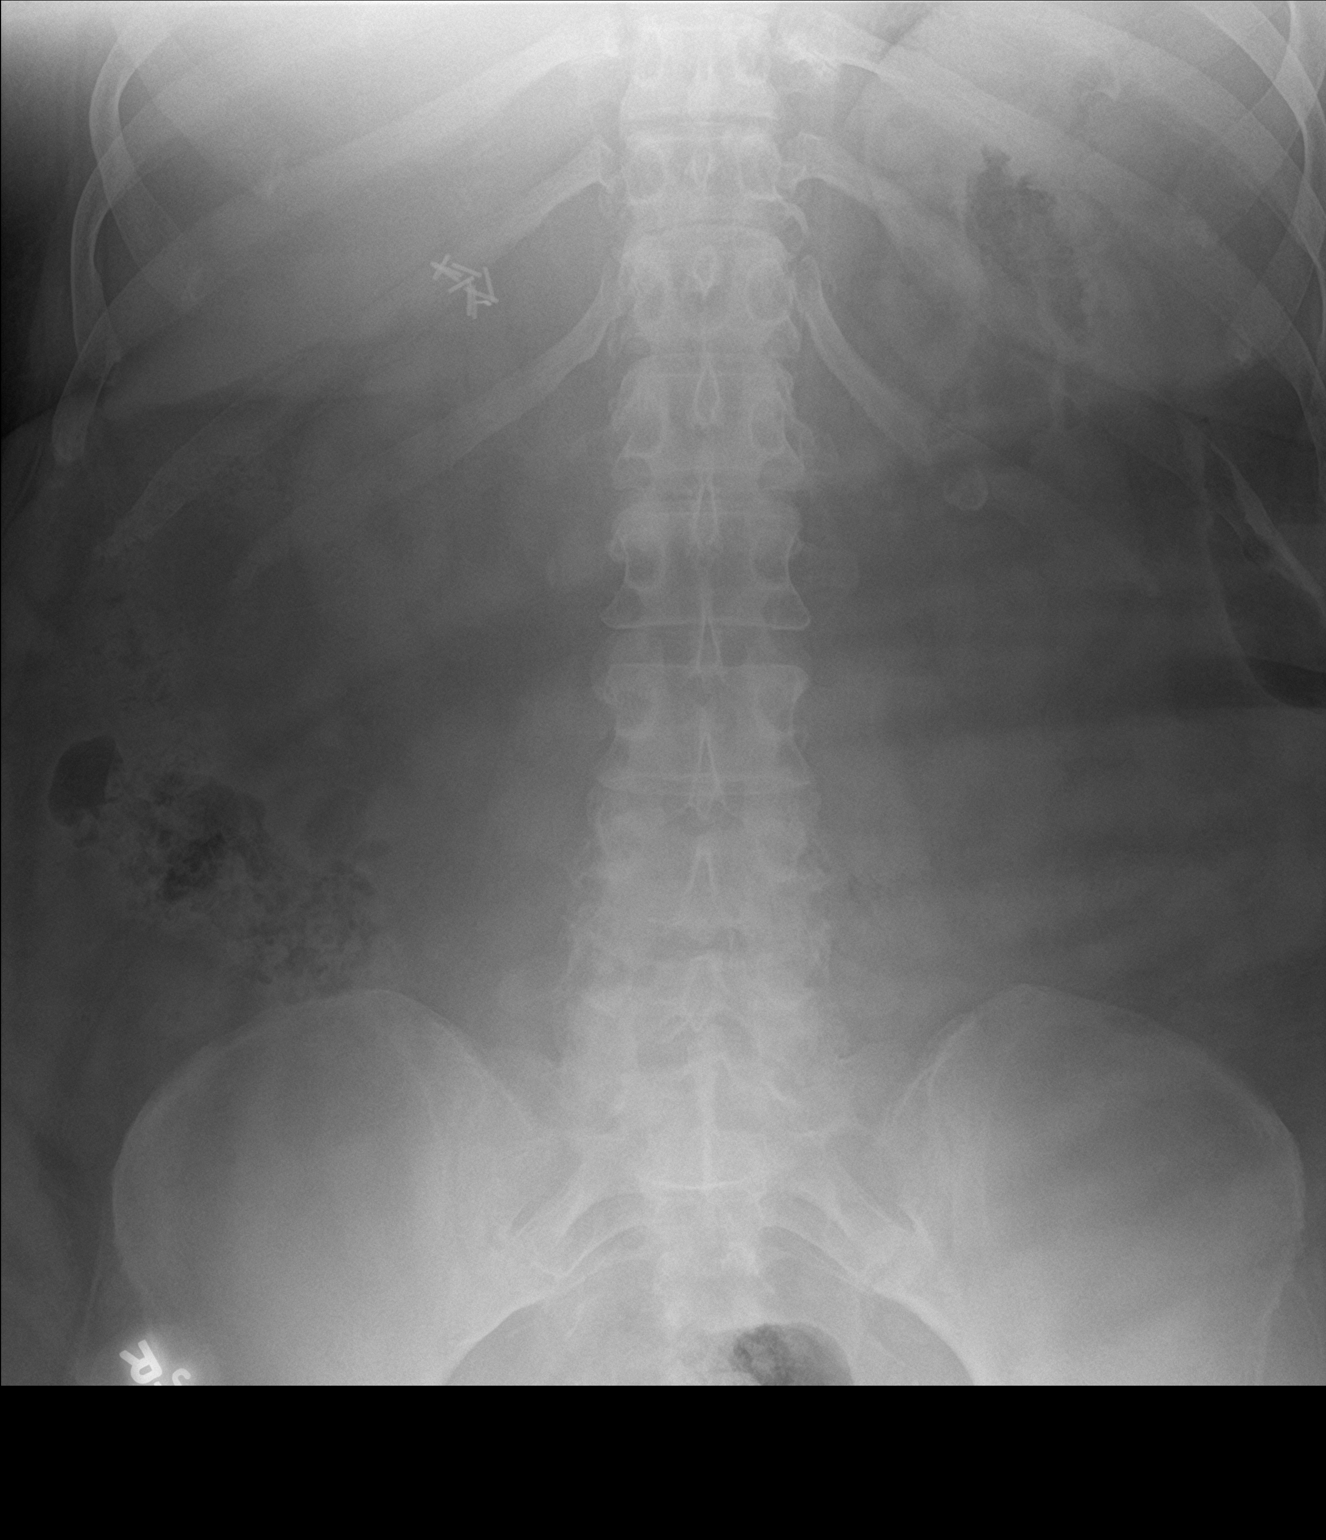

[2 of 2 positions shown; findings below may reference images not displayed]

FINDINGS: There is moderate stool in the colon. There is no bowel dilatation
or air-fluid level to suggest bowel obstruction. No free air. There
are surgical clips in the gallbladder fossa region.
IMPRESSION: Moderate stool in colon.  No evident bowel obstruction or free air.

## 2019-10-07 IMAGING — MR MR HEAD W/O CM
4 series · 48 of 48 positions shown · non-contrast
Comparison: [DATE].

CLINICAL DATA: Stroke, follow-up

EXAM:
MRI HEAD WITHOUT CONTRAST
TECHNIQUE: Multiplanar, multiecho pulse sequences of the brain and surrounding
structures were obtained without intravenous contrast.

[Series 2: DWI · axial · 3.0mm · 0.94mm/px · z∈[-69,+80]mm · 16 of 104 slices shown (1 of 3)]
[im 1/104]
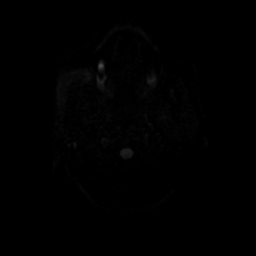
[im 7/104]
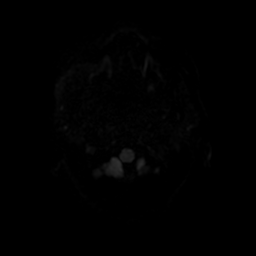
[im 14/104]
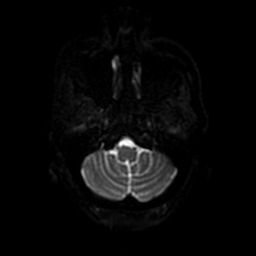
[im 21/104]
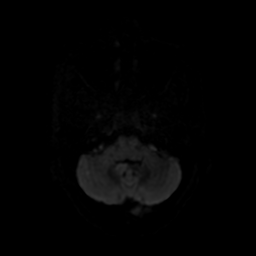
[im 28/104]
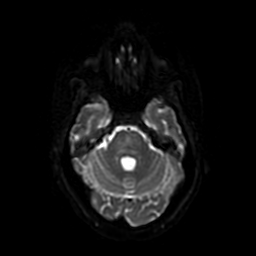
[im 35/104]
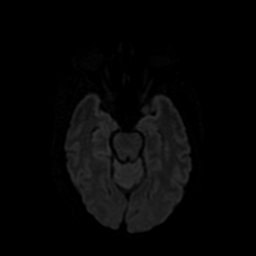
[im 42/104]
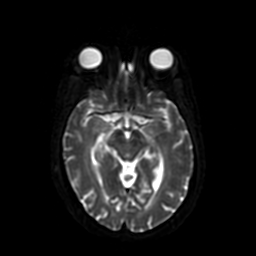
[im 49/104]
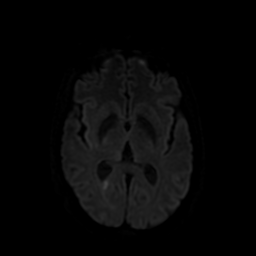
[im 55/104]
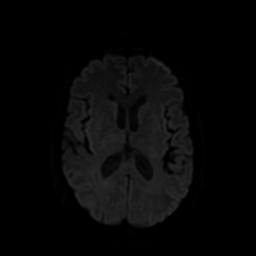
[im 62/104]
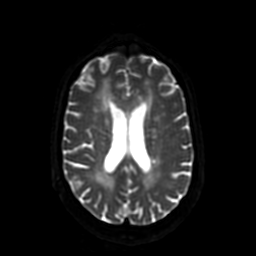
[im 69/104]
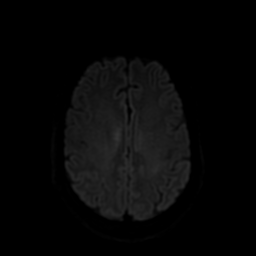
[im 76/104]
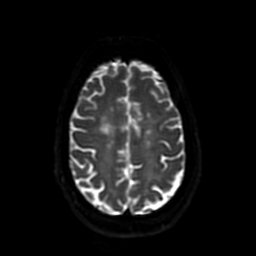
[im 83/104]
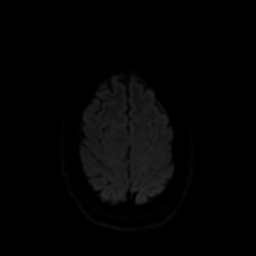
[im 90/104]
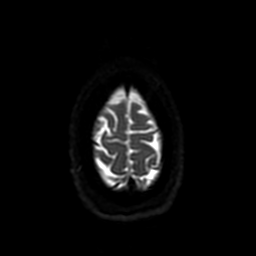
[im 97/104]
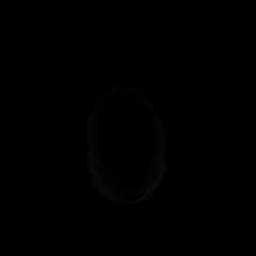
[im 104/104]
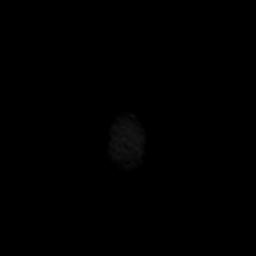

[Series 3: DWI · coronal · 4.0mm · 0.94mm/px · 12 of 78 slices shown (2 of 3)]
[im 1/78]
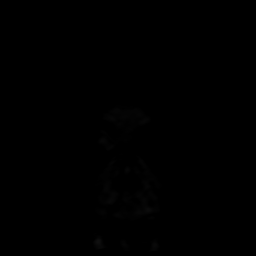
[im 8/78]
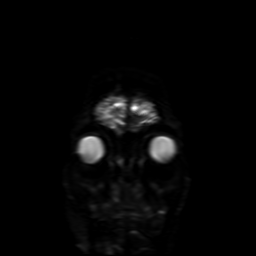
[im 15/78]
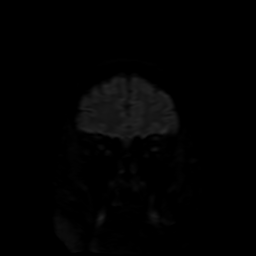
[im 22/78]
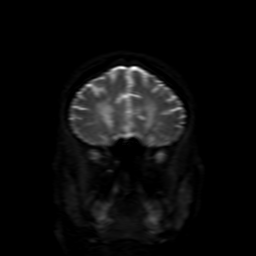
[im 29/78]
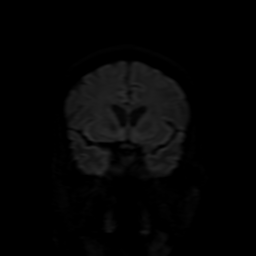
[im 36/78]
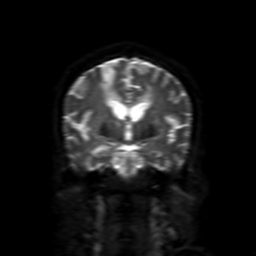
[im 43/78]
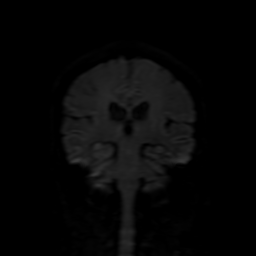
[im 50/78]
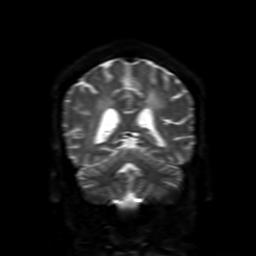
[im 57/78]
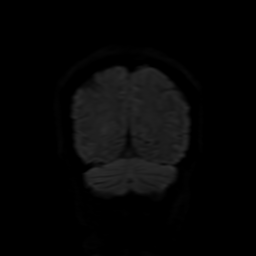
[im 64/78]
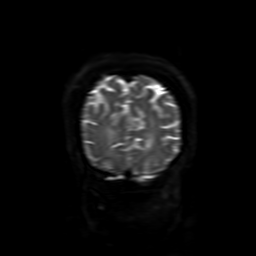
[im 71/78]
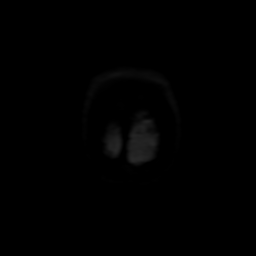
[im 78/78]
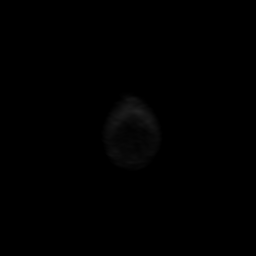

[Series 250: ADC · axial · 3.0mm · 0.94mm/px · z∈[-69,+80]mm · 8 of 52 slices shown]
[im 1/52]
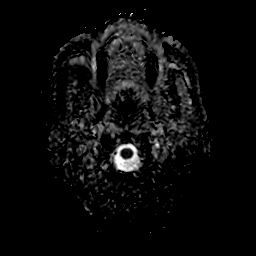
[im 8/52]
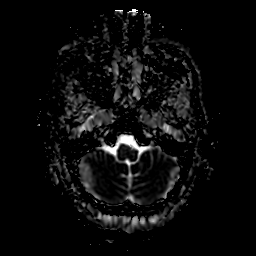
[im 15/52]
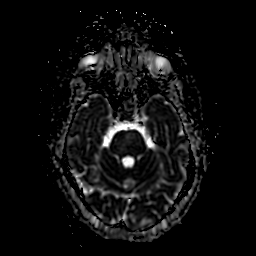
[im 22/52]
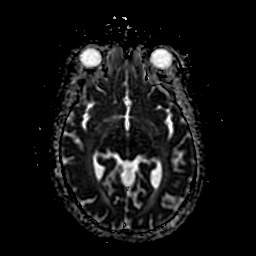
[im 30/52]
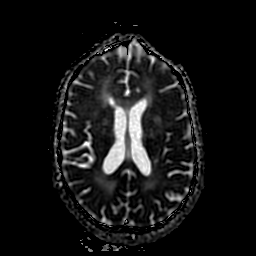
[im 37/52]
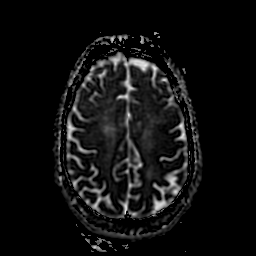
[im 44/52]
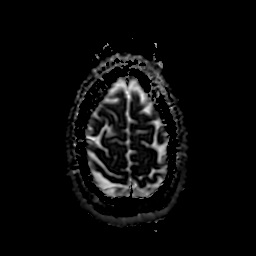
[im 52/52]
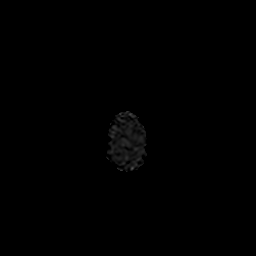

[Series 310: DWI · coronal · 4.0mm · 0.94mm/px · 12 of 78 slices shown (3 of 3)]
[im 1/78]
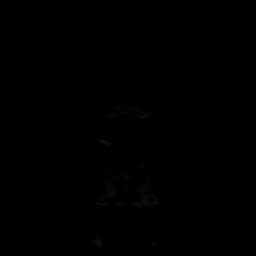
[im 8/78]
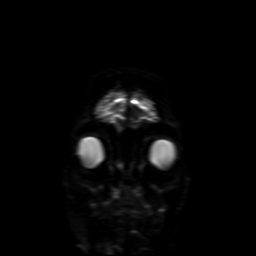
[im 15/78]
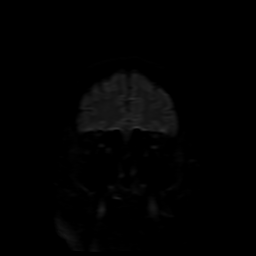
[im 22/78]
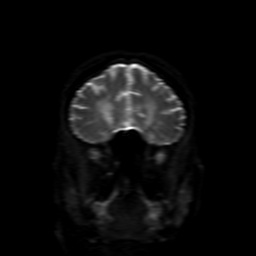
[im 29/78]
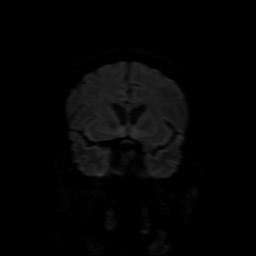
[im 36/78]
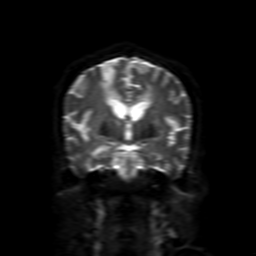
[im 43/78]
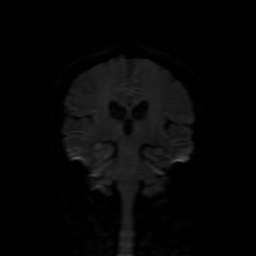
[im 50/78]
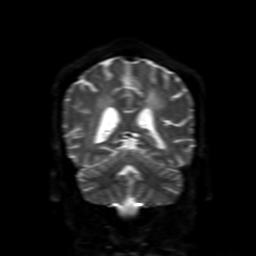
[im 57/78]
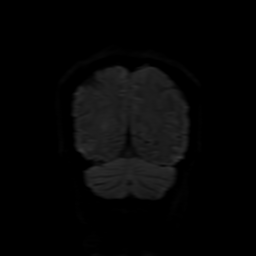
[im 64/78]
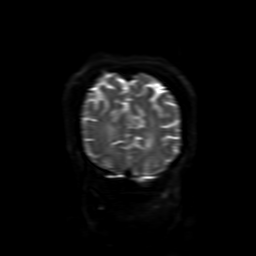
[im 71/78]
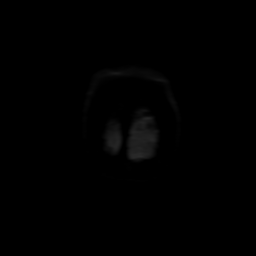
[im 78/78]
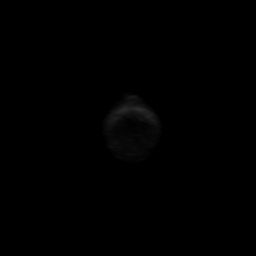

[48 of 48 positions shown; findings below may reference images not displayed]

FINDINGS: Axial and coronal diffusion-weighted imaging performed. New focus of
restricted diffusion in the parasagittal left frontal lobe abutting
the left lateral ventricle. There is persistent restricted diffusion
in the right periatrial white matter. Near complete resolution of
diffusion hyperintensity in the left cerebellum.
IMPRESSION: New acute infarct of the parasagittal left frontal lobe in the ACA
territory.

Persistent restricted diffusion in the right periatrial white matter
suggesting further acute ischemia.

Near complete resolution of abnormal diffusion signal in the left
cerebellum.

## 2019-10-07 MED ORDER — SENNOSIDES-DOCUSATE SODIUM 8.6-50 MG PO TABS
1.0000 | ORAL_TABLET | Freq: Two times a day (BID) | ORAL | Status: DC
  Administered 2019-10-07 – 2019-10-12 (×11): 1 via ORAL
  Filled 2019-10-07 (×11): qty 1

## 2019-10-07 MED ORDER — POLYETHYLENE GLYCOL 3350 17 G PO PACK
17.0000 g | PACK | Freq: Every day | ORAL | Status: DC
  Administered 2019-10-07 – 2019-10-12 (×3): 17 g via ORAL
  Filled 2019-10-07 (×6): qty 1

## 2019-10-07 MED ORDER — CHLORHEXIDINE GLUCONATE CLOTH 2 % EX PADS: 6.0000 | MEDICATED_PAD | Freq: Every day | CUTANEOUS | Status: DC

## 2019-10-07 NOTE — Plan of Care (Signed)
Problem: Consults Goal: RH STROKE PATIENT EDUCATION Description: See Patient Education module for education specifics  Outcome: Completed/Met   Problem: RH BOWEL ELIMINATION Goal: RH STG MANAGE BOWEL WITH ASSISTANCE Description: STG Manage Bowel with Mod I Assistance. Outcome: Completed/Met   Problem: RH BLADDER ELIMINATION Goal: RH STG MANAGE BLADDER WITH ASSISTANCE Description: STG Manage Bladder With Mod I Assistance Outcome: Completed/Met   Problem: RH SAFETY Goal: RH STG ADHERE TO SAFETY PRECAUTIONS W/ASSISTANCE/DEVICE Description: STG Adhere to Safety Precautions With supervision Assistance/Device. Outcome: Completed/Met   Problem: RH PAIN MANAGEMENT Goal: RH STG PAIN MANAGED AT OR BELOW PT'S PAIN GOAL Description: <3 out of 10.  Outcome: Completed/Met   Problem: RH KNOWLEDGE DEFICIT Goal: RH STG INCREASE KNOWLEDGE OF HYPERTENSION Description: Pt will verbalize management of hypertension including medications and stress management with handouts and cues  Outcome: Completed/Met Goal: RH STG INCREASE KNOWLEDGE OF STROKE PROPHYLAXIS Description: Pt will be able to verbalize stroke prevention techniques with min assistance, cues, and educational material.  Outcome: Completed/Met

## 2019-10-07 NOTE — Discharge Instructions (Signed)
Inpatient Rehab Discharge Instructions  [name] Discharge date and time: No discharge date for patient encounter.   Activities/Precautions/ Functional Status: Activity: activity as tolerated Diet: renal diet Wound Care: none needed Functional status:  ___ No restrictions     ___ Walk up steps independently ___ 24/7 supervision/assistance   ___ Walk up steps with assistance ___ Intermittent supervision/assistance  ___ Bathe/dress independently ___ Walk with walker     _x__ Bathe/dress with assistance ___ Walk Independently    ___ Shower independently ___ Walk with assistance    ___ Shower with assistance ___ No alcohol     ___ Return to work/school ________ COMMUNITY REFERRALS UPON DISCHARGE:     Outpatient: PT    OT               Agency: [facility] Phone: [phone]              Appointment Date/Time: Will Schedule W/ DTR  Medical Equipment/Items Ordered: Rolling Walker, Bedside Commode, Tub Producer, television/film/video                                                 Agency/Supplier: [employer]  Special Instructions: No driving smoking or alcohol  Continue aspirin and Plavix x3 weeks then Plavix alone   STROKE/TIA DISCHARGE INSTRUCTIONS SMOKING Cigarette smoking nearly doubles your risk of having a stroke & is the single most alterable risk factor  If you smoke or have smoked in the last 12 months, you are advised to quit smoking for your health.  Most of the excess cardiovascular risk related to smoking disappears within a year of stopping.  Ask you doctor about anti-smoking medications  [place] Quit Line: 1-800-QUIT NOW  Free Smoking Cessation Classes [phone]  CHOLESTEROL Know your levels; limit fat & cholesterol in your diet  Lipid Panel     Component Value Date/Time   CHOL 195 09/19/2019 0359   CHOL 181 11/12/2018 0949   TRIG 107 09/19/2019 0359   HDL 47 09/19/2019 0359   HDL 55 11/12/2018 0949   CHOLHDL 4.1 09/19/2019 0359     VLDL 21 09/19/2019 0359   LDLCALC 127 (H) 09/19/2019 0359   LDLCALC 107 (H) 11/12/2018 0949      Many patients benefit from treatment even if their cholesterol is at goal.  Goal: Total Cholesterol (CHOL) less than 160  Goal:  Triglycerides (TRIG) less than 150  Goal:  HDL greater than 40  Goal:  LDL (LDLCALC) less than 100   BLOOD PRESSURE American Stroke Association blood pressure target is less that 120/80 mm/Hg  Your discharge blood pressure is:  BP: 140/86  Monitor your blood pressure  Limit your salt and alcohol intake  Many individuals will require more than one medication for high blood pressure  DIABETES (A1c is a blood sugar average for last 3 months) Goal HGBA1c is under 7% (HBGA1c is blood sugar average for last 3 months)  Diabetes:   Lab Results  Component Value Date   HGBA1C 6.7 (H) 09/19/2019     Your HGBA1c can be lowered with medications, healthy diet, and exercise.  Check your blood sugar as directed by your physician  Call your physician if you experience unexplained or low blood sugars.  PHYSICAL ACTIVITY/REHABILITATION Goal is 30 minutes at least 4 days per week  Activity: Increase activity  slowly, Therapies: Physical Therapy: Home Health Return to work:   Activity decreases your risk of heart attack and stroke and makes your heart stronger.  It helps control your weight and blood pressure; helps you relax and can improve your mood.  Participate in a regular exercise program.  Talk with your doctor about the best form of exercise for you (dancing, walking, swimming, cycling).  DIET/WEIGHT Goal is to maintain a healthy weight  Your discharge diet is:  Diet Order            Diet renal/carb modified with fluid restriction Diet-HS Snack? Nothing; Fluid restriction: 1200 mL Fluid; Room service appropriate? Yes; Fluid consistency: Thin  Diet effective now              liquids Your height is:  Height: 5' 9.02" (175.3 cm) Your current weight is:  Weight: 101.9 kg Your Body Mass Index (BMI) is:  BMI (Calculated): 33.16  Following the type of diet specifically designed for you will help prevent another stroke.  Your goal weight range is:    Your goal Body Mass Index (BMI) is 19-24.  Healthy food habits can help reduce 3 risk factors for stroke:  High cholesterol, hypertension, and excess weight.  RESOURCES Stroke/Support Group:  Call [phone]   STROKE EDUCATION PROVIDED/REVIEWED AND GIVEN TO PATIENT Stroke warning signs and symptoms How to activate emergency medical system (call 911). Medications prescribed at discharge. Need for follow-up after discharge. Personal risk factors for stroke. Pneumonia vaccine given:  Flu vaccine given:  My questions have been answered, the writing is legible, and I understand these instructions.  I will adhere to these goals & educational materials that have been provided to me after my discharge from the hospital.    alone  Hemodialysis as directed   My questions have been answered and I understand these instructions. I will adhere to these goals and the provided educational materials after my discharge from the hospital.  Patient/Caregiver Signature _______________________________ Date __________  Clinician Signature _______________________________________ Date __________  Please bring this form and your medication list with you to all your follow-up doctor's appointments.

## 2019-10-07 NOTE — Progress Notes (Addendum)
[facility] MEDICINE [facility] REHABILITATION PROGRESS NOTE   Subjective/Complaints:  Pt states she had nausea and vomiting started after HD, emesis actually started early am today  No abd pain ,   Review of systems -  Pt denies SOB, abd pain, CP, +N/V and vision changes   Objective:   No results found. Recent Labs    10/04/19 1648 10/06/19 1356  WBC 7.8 6.7  HGB 10.0* 9.3*  HCT 33.5* 32.5*  PLT 419* 350   Recent Labs    10/04/19 1648 10/06/19 1357  NA 136 134*  K 2.9* 4.2  CL 96* 95*  CO2 27 28  GLUCOSE 85 243*  BUN 11 23*  CREATININE 4.96* 9.63*  CALCIUM 8.3* 9.3    Intake/Output Summary (Last 24 hours) at 10/07/2019 0850 Last data filed at 10/06/2019 1735 Gross per 24 hour  Intake 118 ml  Output 2000 ml  Net -1882 ml     Physical Exam: Vital Signs Blood pressure (!) 144/66, pulse (!) 102, temperature 98.4 F (36.9 C), temperature source Oral, resp. rate 18, height 5' 9.02" (1.753 m), weight 102.3 kg, SpO2 100 %.    General: No acute distress Mood and affect are appropriate Heart: Regular rate and rhythm no rubs murmurs or extra sounds Lungs: Clear to auscultation, breathing unlabored, no rales or wheezes Abdomen: Positive bowel sounds, soft nontender to palpation, nondistended Extremities: No clubbing, cyanosis, or edema Skin: No evidence of breakdown, no evidence of rash    Neurologic:, motor strength is 5/5 in Right 4-/5 Left  deltoid, bicep, tricep, grip, 4- hip flexor, knee extensors, ankle dorsiflexor and plantar flexor unchanged No evidence of nystagmus Sensory exam normal sensation to light touch in bilateral upper and lower extremities Good fine motor No dysmetria with L FNF but moving very slowly  Musculoskeletal: No pain with upper limb or lower limb active assistive range of motion   Assessment/Plan: 1. 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 also had a left cerebellar stroke Will  hold on D/C , further w/u of N/V may have PT today   Care Tool:  Bathing    Body parts bathed by patient: Left arm, Chest, Abdomen, Front perineal area, Right upper leg, Left upper leg, Face, Right lower leg, Buttocks, Left lower leg, Right arm   Body parts bathed by helper: Right arm     Bathing assist Assist Level: Supervision/Verbal cueing     Upper Body Dressing/Undressing Upper body dressing   What is the patient wearing?: Pull over shirt    Upper body assist Assist Level: Supervision/Verbal cueing    Lower Body Dressing/Undressing Lower body dressing      What is the patient wearing?: Pants     Lower body assist Assist for lower body dressing: Supervision/Verbal cueing     Toileting Toileting Toileting Activity did not occur (Clothing management and hygiene only): N/A (no void or bm)  Toileting assist Assist for toileting: Supervision/Verbal cueing     Transfers Chair/bed transfer  Transfers assist     Chair/bed transfer assist level: Independent with assistive device Chair/bed transfer assistive device: Programmer, multimedia   Ambulation assist      Assist level: Supervision/Verbal cueing Assistive device: Walker-rolling Max distance: 150 ft   Walk 10 feet activity   Assist     Assist level: Supervision/Verbal cueing Assistive device: Walker-rolling   Walk 50 feet activity   Assist Walk 50 feet with 2 turns activity did not occur: Safety/medical  concerns  Assist level: Supervision/Verbal cueing Assistive device: Walker-rolling    Walk 150 feet activity   Assist Walk 150 feet activity did not occur: Safety/medical concerns  Assist level: Supervision/Verbal cueing Assistive device: Walker-rolling    Walk 10 feet on uneven surface  activity   Assist     Assist level: Supervision/Verbal cueing Assistive device: Aeronautical engineer Will patient use wheelchair at discharge?: No              Wheelchair 50 feet with 2 turns activity    Assist            Wheelchair 150 feet activity     Assist          Blood pressure (!) 144/66, pulse (!) 102, temperature 98.4 F (36.9 C), temperature source Oral, resp. rate 18, height 5' 9.02" (1.753 m), weight 102.3 kg, SpO2 100 %.  Medical Problem List and Plan: 1.Left-sided weakness and dysarthriasecondary to 2 small infarcts left cerebellar deep nucleusand posterior to the atrium of the right lateral ventricle most likely separate small vessel disease. Plan 30-day cardiac event monitor as outpatient Unusual presentation- bradykinesia without tremor Cerebellar infarct not causing dysmetria  -patient may shower -ELOS/Goals: 2-2.5 weeks; goals supervision to mod I  Nausea- emesis, will check KUB, UA C and S, no new meds, no new neuro deficits, if this persists or worsens esp if more imblalance or ataxia, may need limited MRI brain to see if cerebellar infarct extended  2. Antithrombotics: -DVT/anticoagulation:Subcutaneous heparin- no lovenox due to HD, may d/c once amb 100', went 150' yesterday  -antiplatelet therapy: Aspirin and Plavix x3 weeks then Plavix alone 3. Pain Management:Tylenol as needed 4. Mood:Provide emotional support -antipsychotic agents: N/A 5. Neuropsych: This patientiscapable of making decisions on herown behalf. 6. Skin/Wound Care:Routine skin checks 7. Fluids/Electrolytes/Nutrition:Routine in and outs with follow-up chemistries- Hypokalemia nephro to manage with HD  8. End-stage renal disease. Continue hemodialysis as directed by Nephro 9. Hyperlipidemia. Lipitor 10. Diet controlled diabetes mellitus. Hemoglobin A1c 6.7. Blood sugar checks currently discontinued  11. Permissive hypertension/tachycardia. Lopressor initiated 50mg  twice daily 09/22/2019. Monitor with increased mobility and titrate as required.   Vitals:    10/06/19 1930 10/07/19 0419  BP: (!) 123/55 (!) 144/66  Pulse: 96 (!) 102  Resp: 17 18  Temp: 97.8 F (36.6 C) 98.4 F (36.9 C)  SpO2: 100% 100%  BP controlled 6/3  12. UTI,   5/22- Last WBC was 17.2- will recheck in AM- no urine Cx was able to be gotten by nursing- will con't to try - on no ABX- will monitor  5/23- Nursing got Cx- is staph capitus >100k- WBC is down to 12.2 from >17k- afebrile; pt feels good- sens to oxacillin, 7d course amoxil   13.  HypoK- improved per BMET 6/2 LOS: 14 days A FACE TO [place] E Barak Bialecki 10/07/2019, 8:50 AM

## 2019-10-07 NOTE — Progress Notes (Signed)
[facility] Progress Note  Subjective: nausea overnight after HD yest  Vitals:   10/06/19 1730 10/06/19 1735 10/06/19 1930 10/07/19 0419  BP: (!) 148/76 137/72 (!) 123/55 (!) 144/66  Pulse: 95 90 96 (!) 102  Resp:  18 17 18   Temp:  97.9 F (36.6 C) 97.8 F (36.6 C) 98.4 F (36.9 C)  TempSrc:  Oral  Oral  SpO2:  100% 100% 100%  Weight:  101.7 kg  102.3 kg  Height:        Exam: General:alert,NAD Heart:RRR, no murmur Lungs:CTAB Abdomen:bs +,soft, non-tender, ND Extremities:no LE edema Dialysis Access:LUE AVF + bruit  Dialysis Orders: MWF [facility] 4h 400/800 105.5kg 3K/2.25 bath UFP #3 LUE AVF Hep 2000 - Hectoral 13mcg Iv q HD - Mircera 276mcg IV q 2 weeks (last 5/10) - Venofer 100 x 10 ordered --> for Hgb 8.5, tsat 7% on 5/10.  Problem/Plan: 1. Nausea - recurrent issue, hx gastroparesis, per primary 2. Acute L cerebellar + subcentimeter deep white matter R ION:[id number] L sided weakness(has improved over the week ) and facial droop improving. Echo normal. CTA without carotid or posterior circulation stenoses. Prior CVA on imaging that she was unaware of.Neuro followed, plan is for 30d event monitor to r/o A-fib.///transitioned to CIR  3. ESRD:Continue HD MWF. HD friday 4. Hypertension/volume:sp permissiveHTN w/ acute CVA.BP now controlled/low sided. Metoprolol lowered to 12.5mg  bid. Min UF w/ HD. 3kg under 5. Anemia(ESRD + IDA):Hgb10.2> 9.9>9.3>9.6 . 200 aranespHd 5/26,On high dose ESA as outpatient. 6. Metabolic bone disease:Corec Ca10.3  [name] 4  Not given  Use 2.0 ca bath ,  Phos 3.7-resumedhome binder Lorin Picket). 7. T2DM- per admit 8. Hyperlipidemia: Prev on statin - off for unknown time, resumed here. 9. Leukocytosis:WBC16.9risingand now down to 12>11.3>8.3>7.7 , asymptomatic UTI = Staph .>100,000 rx'd w/ po Amoxcompleted 10. Gastroparesis: Chronic issue, see #1     Rob Nivea Wojdyla 10/07/2019, 12:41  PM   Recent Labs  Lab 10/04/19 1648 10/04/19 1648 10/06/19 1356 10/06/19 1357 10/07/19 1021 10/07/19 1121  K 2.9*   < >  --  4.2 4.4  --   BUN 11   < >  --  23* 10  --   CREATININE 4.96*   < >  --  9.63* 6.76*  --   CALCIUM 8.3*   < >  --  9.3 10.0  --   PHOS 1.9*  --   --  4.1  --   --   HGB 10.0*   < > 9.3*  --   --  12.4   < > = values in this interval not displayed.   Inpatient medications: . aspirin EC  81 mg Oral Daily  . atorvastatin  40 mg Oral Daily  . Chlorhexidine Gluconate Cloth  6 each Topical Q0600  . clopidogrel  75 mg Oral Daily  . darbepoetin (ARANESP) injection - DIALYSIS  200 mcg Intravenous Q Wed-HD  . feeding supplement (NEPRO CARB STEADY)  237 mL Oral TID BM  . ferric citrate  420 mg Oral TID WC  . metoprolol tartrate  12.5 mg Oral BID  . multivitamin  1 tablet Oral QHS  . ondansetron  4 mg Oral Q12H  . polyethylene glycol  17 g Oral Daily  . senna-docusate  1 tablet Oral BID    acetaminophen **OR** acetaminophen, sorbitol

## 2019-10-08 DIAGNOSIS — I6340 Cerebral infarction due to embolism of unspecified cerebral artery: Secondary | ICD-10-CM

## 2019-10-08 DIAGNOSIS — E1159 Type 2 diabetes mellitus with other circulatory complications: Secondary | ICD-10-CM

## 2019-10-08 DIAGNOSIS — E7800 Pure hypercholesterolemia, unspecified: Secondary | ICD-10-CM

## 2019-10-08 DIAGNOSIS — I10 Essential (primary) hypertension: Secondary | ICD-10-CM

## 2019-10-08 LAB — GLUCOSE, CAPILLARY
Glucose-Capillary: 207 mg/dL — ABNORMAL HIGH (ref 70–99)
Glucose-Capillary: 151 mg/dL — ABNORMAL HIGH (ref 70–99)

## 2019-10-08 MED ORDER — METOPROLOL TARTRATE 12.5 MG HALF TABLET
12.5000 mg | ORAL_TABLET | Freq: Every day | ORAL | Status: DC
  Administered 2019-10-09: 12.5 mg via ORAL
  Filled 2019-10-08 (×3): qty 1

## 2019-10-08 MED ORDER — ASPIRIN EC 325 MG PO TBEC
325.0000 mg | DELAYED_RELEASE_TABLET | Freq: Every day | ORAL | Status: AC
  Administered 2019-10-09: 325 mg via ORAL
  Filled 2019-10-08: qty 1

## 2019-10-08 MED ORDER — HEPARIN SODIUM (PORCINE) 1000 UNIT/ML IJ SOLN
INTRAMUSCULAR | Status: AC
  Administered 2019-10-08: 2000 [IU]
  Filled 2019-10-08: qty 2

## 2019-10-08 MED ORDER — METOPROLOL TARTRATE 12.5 MG HALF TABLET
12.5000 mg | ORAL_TABLET | Freq: Once | ORAL | Status: AC
  Administered 2019-10-08: 12.5 mg via ORAL

## 2019-10-08 NOTE — Consult Note (Signed)
Stroke Neurology Consultation Note  Consult Requested by: Dr. Letta Pate  Reason for Consult: new stroke  Consult Date: 10/08/19  The history was obtained from the pt.  During history and examination, all items were able to obtain unless otherwise noted.  History of Present Illness:  [name] is a 59 y.o. African American female with PMH of ESRD on hemodialysis, hypertension, hyperlipidemia, diabetes had recent stroke 3 weeks ago and currently at inpatient rehab.  She was admitted on 09/18/2019 for left-sided weakness slurred speech and facial droop.  CT no acute finding, but old frontal cortical and subcortical infarction.  MRI showed small left cerebellum infarct as well as punctate periventricular white matter infarct at right lateral ventricle.  CTA head and neck no LVO, atherosclerotic changes at both carotid bifurcation and the siphon regions without significant stenosis.  EF 55 to 60%.  LDL 127, A1c 6.7.  Put on aspirin 81 and Plavix 75 along with Lipitor 40 on discharge.  Recommend 30-day cardiac event monitoring as outpatient to rule out A. Fib.  She has mild left-sided residual, working with PT OT in rehab and has improvement, about to discharge yesterday when she developed whole body weakness and nausea vomiting.  Discharge canceled, had MRI overnight showed new infarct at left ACA territory and again right periventricular white matter DWI signal at same region was last time, concerning for recurrent infarct at same spot.  Overnight, patient nausea vomiting and whole-body weakness much improved, this morning she again at her baseline.  Neurology was consulted for further recommendations.  Checking patient vitals for the last 1 week, it showed almost every day in the morning 5 AM patient had low BP at 90s.  Patient also stated that in the morning when she woke up she felt dizzy but lasting short period of time.  During the day her BP was between 110-140s.  She stated that she had occasional  palpitation feeling but not frequent.  She denies any headache, unilateral weakness or numbness, seizure activity or loss of consciousness.  Denies vision changes or speech difficulty.  LSN: 6/3 8am tPA Given: No: outside window, recent stroke  Past Medical History:  Diagnosis Date  . Accelerated hypertension   . Aortic atherosclerosis ([place])   . Arthritis of knee    bilateral  . Diabetes mellitus    type 2 - no meds  . Diabetic gastroparesis ([place])   . Diverticulosis   . ESRD (end stage renal disease) ([place])   . Fibroid uterus   . GERD (gastroesophageal reflux disease)   . Hyperlipidemia   . Hypertension   . HYPERTENSION, BENIGN SYSTEMIC 07/03/2006        . Hypertensive emergency 02/05/2019  . Hypokalemia   . IDA (iron deficiency anemia)   . Nausea   . Renal disorder   . Umbilical hernia   . Wears dentures     Past Surgical History:  Procedure Laterality Date  . AV FISTULA PLACEMENT Left 02/17/2019   Procedure: ARTERIOVENOUS (AV) FISTULA CREATION LEFT UPPER ARM;  Surgeon: Waynetta Sandy, MD;  Location: [facility];  Service: Vascular;  Laterality: Left;  . CATARACT EXTRACTION     right eye  . CESAREAN SECTION     x2  . CHOLECYSTECTOMY     laparoscopic  . FISTULA SUPERFICIALIZATION Left 04/06/2019   Procedure: FISTULA SUPERFICIALIZATION LEFT BRACHIOCEPHALIC;  Surgeon: Waynetta Sandy, MD;  Location: [facility];  Service: Vascular;  Laterality: Left;  Transposition left arm brachiocephalic fistula.  . IR  FLUORO GUIDE CV LINE RIGHT  02/09/2019  . IR FLUORO GUIDE CV LINE RIGHT  03/05/2019  . IR US GUIDE VASC ACCESS RIGHT  02/09/2019  . MULTIPLE TOOTH EXTRACTIONS    . REDUCTION MAMMAPLASTY Bilateral     Family History  Problem Relation Age of Onset  . Hypertension Mother   . Diabetes Mother   . Cancer Father        lung  . Colon cancer Neg Hx   . Stomach cancer Neg Hx   . Esophageal cancer Neg Hx     Social History:  reports that she quit smoking about 7  years ago. Her smoking use included cigarettes. She quit after 10.00 years of use. She has never used smokeless tobacco. She reports that she does not drink alcohol or use drugs.  Allergies:  Allergies  Allergen Reactions  . Lisinopril Anaphylaxis and Other (See Comments)    angioedema  . Venofer  [Ferric Oxide]     Other reaction(s): Back Pain  . Camellia Swelling and Other (See Comments)    Angioedema   . Jardiance [Empagliflozin] Swelling and Rash    No current facility-administered medications on file prior to encounter.   Current Outpatient Medications on File Prior to Encounter  Medication Sig Dispense Refill  . Accu-Chek Softclix Lancets lancets Use as instructed (Patient taking differently: 1 each by Other route See admin instructions. Use as instructed) 100 each 12  . acetaminophen (TYLENOL) 500 MG tablet Take 1,000 mg by mouth every 6 (six) hours as needed for moderate pain or headache.    Marland Kitchen aspirin EC 81 MG EC tablet Take 1 tablet (81 mg total) by mouth daily for 15 days.    . B Complex-C-Folic Acid (DIALYVITE 846) 0.8 MG TABS Take 1 tablet by mouth 3 (three) times a week. M, w, f    . Blood Glucose Monitoring Suppl (ACCU-CHEK AVIVA PLUS) w/Device KIT Use to check sugar three times a day (Patient taking differently: 1 each by Other route in the morning, at noon, and at bedtime. ) 1 kit 0  . glucose blood (ACCU-CHEK AVIVA PLUS) test strip Use as instructed (Patient taking differently: 1 each by Other route See admin instructions. Use as instructed) 100 each 12  . Lancets (ACCU-CHEK SOFT TOUCH) lancets Use to check sugars three times a day (Patient taking differently: 1 each by Other route in the morning, at noon, and at bedtime. ) 100 each 12  . metoCLOPramide (REGLAN) 5 MG tablet Take 1 tablet (5 mg total) by mouth 4 (four) times daily -  before meals and at bedtime. (Patient taking differently: Take 5 mg by mouth 3 (three) times daily before meals. ) 120 tablet 2  . metoprolol  tartrate (LOPRESSOR) 50 MG tablet Take 1 tablet (50 mg total) by mouth 2 (two) times daily.    . ondansetron (ZOFRAN-ODT) 4 MG disintegrating tablet Take 1 tablet (4 mg total) by mouth every 12 (twelve) hours. 20 tablet 0  . polyethylene glycol (MIRALAX / GLYCOLAX) 17 g packet Take 17 g by mouth daily as needed for mild constipation. 14 each 0  . Vitamin D, Ergocalciferol, (DRISDOL) 1.25 MG (50000 UT) CAPS capsule Take 50,000 Units by mouth every Monday.       Review of Systems: A full ROS was attempted today and was able to be performed.  Systems assessed include - Constitutional, Eyes, HENT, Respiratory, Cardiovascular, Gastrointestinal, Genitourinary, Integument/breast, Hematologic/lymphatic, Musculoskeletal, Neurological, Behavioral/Psych, Endocrine, Allergic/Immunologic - with pertinent responses as per HPI.  Physical Examination: Temp:  [98.1 F (36.7 C)] 98.1 F (36.7 C) (06/04 0520) Pulse Rate:  [98-100] 98 (06/04 0520) Resp:  [18-20] 18 (06/04 0520) BP: (101-158)/(61-75) 101/61 (06/04 0520) SpO2:  [97 %] 97 % (06/04 0520) Weight:  [103.4 kg] 103.4 kg (06/03 1633)  General - well nourished, well developed, in no apparent distress.    Ophthalmologic - fundi not visualized due to noncooperation.    Cardiovascular - regular rhythm and rate  Mental Status -  Level of arousal and orientation to time, place, and person were intact. Language including expression, naming, repetition, comprehension, reading, and writing was assessed and found intact. Fund of Knowledge was assessed and was intact.  Cranial Nerves II - XII - II - Vision intact OU. III, IV, VI - Extraocular movements intact. V - Facial sensation intact bilaterally. VII - Facial movement intact bilaterally. VIII - Hearing & vestibular intact bilaterally. X - Palate elevates symmetrically. XI - Chin turning & shoulder shrug intact bilaterally. XII - Tongue protrusion intact.  Motor Strength - The patient's strength  was normal in all extremities and pronator drift was absent except LLE 4+/5 proximal.   Motor Tone & Bulk - Muscle tone was assessed at the neck and appendages and was normal.  Bulk was normal and fasciculations were absent.   Reflexes - The patient's reflexes were normal in all extremities and she had no pathological reflexes.  Sensory - Light touch, temperature/pinprick were assessed and were normal.    Coordination - The patient had normal movements in the hands with no ataxia or dysmetria.  Tremor was absent.  Gait and Station - deferred  Data Reviewed: CT Code Stroke CTA Head W/WO contrast  Result Date: 09/18/2019 CLINICAL DATA:  Acute presentation with left sided weakness and left facial droop. Slurred speech. EXAM: CT ANGIOGRAPHY HEAD AND NECK TECHNIQUE: Multidetector CT imaging of the head and neck was performed using the standard protocol during bolus administration of intravenous contrast. Multiplanar CT image reconstructions and MIPs were obtained to evaluate the vascular anatomy. Carotid stenosis measurements (when applicable) are obtained utilizing NASCET criteria, using the distal internal carotid diameter as the denominator. CONTRAST:  Not annotated. COMPARISON:  Head CT same day FINDINGS: CTA NECK FINDINGS Aortic arch: Aortic atherosclerosis. Branching pattern is normal without origin stenosis. Right carotid system: Common carotid artery widely patent to the bifurcation. Minimal atherosclerotic plaque at the carotid bifurcation and ICA bulb but no stenosis. Cervical ICA widely patent. Left carotid system: Common carotid artery widely patent to the bifurcation. Mild atherosclerotic plaque at the ICA bulb. Minimal diameter 3.5 mm. Compared to a more distal cervical ICA diameter of 4 mm, this indicates a 10-20% stenosis. Vertebral arteries: Both vertebral arteries are patent through the cervical region. Right vertebral artery takes an early origin from the proximal subclavian. Skeleton:  Ordinary mid cervical spondylosis. Other neck: No mass or lymphadenopathy. Upper chest: Normal Review of the MIP images confirms the above findings CTA HEAD FINDINGS Anterior circulation: Both internal carotid arteries are patent through the skull base and siphon regions. There is mild atherosclerotic calcification in the carotid siphon regions but no stenosis greater than 30%. The anterior and middle cerebral vessels are patent without proximal stenosis, aneurysm or vascular malformation. No large or medium vessel occlusion. Posterior circulation: Both vertebral arteries widely patent to the basilar. No basilar stenosis. Posterior circulation branch vessels are normal. Patent posterior communicating arteries. Venous sinuses: Normal Anatomic variants: None Review of the MIP images confirms the above findings IMPRESSION:  No acute large or medium vessel occlusion. Aortic Atherosclerosis (ICD10-I70.0). Atherosclerotic change at both carotid bifurcations. No stenosis on the right. 10-20% ICA bulb stenosis on the left. Ordinary atherosclerotic change in the carotid siphon regions but without stenosis greater than 30%. Electronically Signed   By: Nelson Chimes M.D.   On: 09/18/2019 12:51   DG Chest 1 View  Result Date: 09/20/2019 CLINICAL DATA:  Acute coronary syndrome. EXAM: CHEST  1 VIEW COMPARISON:  04/20/2019 FINDINGS: Cardiac silhouette is mildly enlarged. No mediastinal or hilar masses. No evidence of adenopathy. Clear lungs.  No pleural effusion or pneumothorax. Skeletal structures are grossly intact. IMPRESSION: 1. No acute cardiopulmonary disease. 2. Mild cardiomegaly. Electronically Signed   By: Lajean Manes M.D.   On: 09/20/2019 15:25   DG Abd 1 View  Result Date: 10/07/2019 CLINICAL DATA:  Nausea and vomiting EXAM: ABDOMEN - 1 VIEW COMPARISON:  Sep 22, 2015 FINDINGS: There is moderate stool in the colon. There is no bowel dilatation or air-fluid level to suggest bowel obstruction. No free air. There are  surgical clips in the gallbladder fossa region. IMPRESSION: Moderate stool in colon.  No evident bowel obstruction or free air. Electronically Signed   By: Lowella Grip III M.D.   On: 10/07/2019 11:56   CT Code Stroke CTA Neck W/WO contrast  Result Date: 09/18/2019 CLINICAL DATA:  Acute presentation with left sided weakness and left facial droop. Slurred speech. EXAM: CT ANGIOGRAPHY HEAD AND NECK TECHNIQUE: Multidetector CT imaging of the head and neck was performed using the standard protocol during bolus administration of intravenous contrast. Multiplanar CT image reconstructions and MIPs were obtained to evaluate the vascular anatomy. Carotid stenosis measurements (when applicable) are obtained utilizing NASCET criteria, using the distal internal carotid diameter as the denominator. CONTRAST:  Not annotated. COMPARISON:  Head CT same day FINDINGS: CTA NECK FINDINGS Aortic arch: Aortic atherosclerosis. Branching pattern is normal without origin stenosis. Right carotid system: Common carotid artery widely patent to the bifurcation. Minimal atherosclerotic plaque at the carotid bifurcation and ICA bulb but no stenosis. Cervical ICA widely patent. Left carotid system: Common carotid artery widely patent to the bifurcation. Mild atherosclerotic plaque at the ICA bulb. Minimal diameter 3.5 mm. Compared to a more distal cervical ICA diameter of 4 mm, this indicates a 10-20% stenosis. Vertebral arteries: Both vertebral arteries are patent through the cervical region. Right vertebral artery takes an early origin from the proximal subclavian. Skeleton: Ordinary mid cervical spondylosis. Other neck: No mass or lymphadenopathy. Upper chest: Normal Review of the MIP images confirms the above findings CTA HEAD FINDINGS Anterior circulation: Both internal carotid arteries are patent through the skull base and siphon regions. There is mild atherosclerotic calcification in the carotid siphon regions but no stenosis  greater than 30%. The anterior and middle cerebral vessels are patent without proximal stenosis, aneurysm or vascular malformation. No large or medium vessel occlusion. Posterior circulation: Both vertebral arteries widely patent to the basilar. No basilar stenosis. Posterior circulation branch vessels are normal. Patent posterior communicating arteries. Venous sinuses: Normal Anatomic variants: None Review of the MIP images confirms the above findings IMPRESSION: No acute large or medium vessel occlusion. Aortic Atherosclerosis (ICD10-I70.0). Atherosclerotic change at both carotid bifurcations. No stenosis on the right. 10-20% ICA bulb stenosis on the left. Ordinary atherosclerotic change in the carotid siphon regions but without stenosis greater than 30%. Electronically Signed   By: Nelson Chimes M.D.   On: 09/18/2019 12:51   MR BRAIN WO CONTRAST  Result  Date: 10/07/2019 CLINICAL DATA:  Stroke, follow-up EXAM: MRI HEAD WITHOUT CONTRAST TECHNIQUE: Multiplanar, multiecho pulse sequences of the brain and surrounding structures were obtained without intravenous contrast. COMPARISON:  09/20/2019. FINDINGS: Axial and coronal diffusion-weighted imaging performed. New focus of restricted diffusion in the parasagittal left frontal lobe abutting the left lateral ventricle. There is persistent restricted diffusion in the right periatrial white matter. Near complete resolution of diffusion hyperintensity in the left cerebellum. IMPRESSION: New acute infarct of the parasagittal left frontal lobe in the ACA territory. Persistent restricted diffusion in the right periatrial white matter suggesting further acute ischemia. Near complete resolution of abnormal diffusion signal in the left cerebellum. Electronically Signed   By: Macy Mis M.D.   On: 10/07/2019 18:16   MR BRAIN WO CONTRAST  Result Date: 09/20/2019 CLINICAL DATA:  59 year old female dialysis patient with code stroke presentation on 09/18/2019, unexplained  altered mental status. Did not receive IV tPA. Brain MRI yesterday positive for small ski mic foci in the left cerebellum and right periatrial white matter. EXAM: MRI HEAD WITHOUT CONTRAST TECHNIQUE: Multiplanar, multiecho pulse sequences of the brain and surrounding structures were obtained without intravenous contrast. COMPARISON:  Brain MRI 09/19/2019 and earlier. FINDINGS: Brain: Small foci of restricted diffusion in the right periatrial white matter and left cerebellum persist, with the latter slightly more intense on DWI today, and with the small adjacent left cerebellar area of involvement (series 3, image 13). Associated T2 and FLAIR hyperintensity as before with no evidence of acute hemorrhage, no mass effect. No new areas of abnormal diffusion. Widespread bilateral cerebral white matter T2 and FLAIR hyperintensity with a small area of focal cortical encephalomalacia in the right superior frontal gyrus, patchy abnormal signal in the bilateral deep gray nuclei, and in the pons. Occasional chronic micro hemorrhages again noted. No midline shift, mass effect, evidence of mass lesion, ventriculomegaly, extra-axial collection or acute intracranial hemorrhage. Cervicomedullary junction and pituitary are within normal limits. Vascular: Major intracranial vascular flow voids are stable. Skull and upper cervical spine: Negative visible cervical spine. Bone marrow signal is stable and within normal limits. Sinuses/Orbits: Stable and negative. Other: Visible internal auditory structures appear normal. Mastoids remain clear. Scalp and face soft tissues appear negative. IMPRESSION: 1. Small acute infarcts in the right periatrial white matter and left cerebellum are not significantly changed from yesterday, with no associated hemorrhage or mass effect. 2. No new acute intracranial abnormality identified. Underlying signal changes compatible with moderately advanced chronic ischemia. Electronically Signed   By: Genevie Ann  M.D.   On: 09/20/2019 20:50   MR BRAIN WO CONTRAST  Result Date: 09/19/2019 CLINICAL DATA:  Left arm and leg weakness with facial droop. Acute presentation yesterday. Negative CT evaluation. EXAM: MRI HEAD WITHOUT CONTRAST TECHNIQUE: Multiplanar, multiecho pulse sequences of the brain and surrounding structures were obtained without intravenous contrast. COMPARISON:  CT studies 09/18/2019 FINDINGS: Brain: Diffusion imaging shows a 1 cm acute to subacute infarction in the left cerebellum. Chronic small-vessel ischemic changes are present throughout the pons. Cerebral hemispheres show a subcentimeter acute infarction in the white matter posterior to the atrium of the right lateral ventricle. Chronic small-vessel ischemic changes are seen extensively throughout the cerebral hemispheric deep and subcortical white matter and basal ganglia. There is an old right frontal cortical and subcortical infarction. No mass lesion, hemorrhage, hydrocephalus or extra-axial collection. One could consider the possibility of coexistence demyelinating disease, but in a patient with diabetes, hypertension and hyperlipidemia, findings are presumed to represent small vessel disease.  Vascular: Major vessels at the base of the brain show flow. Skull and upper cervical spine: Negative Sinuses/Orbits: Clear/normal Other: None IMPRESSION: Background pattern of extensive chronic small-vessel ischemic change throughout the brain as outlined above. 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 1 cm acute infarction in the left cerebellum. Subcentimeter acute infarction in the deep white matter just posterior to the atrium of the right lateral ventricle. Neither is associated with hemorrhage or mass effect. Electronically Signed   By: Nelson Chimes M.D.   On: 09/19/2019 07:49   ECHOCARDIOGRAM COMPLETE  Result Date: 09/19/2019    ECHOCARDIOGRAM REPORT   Patient Name:   DELAYNE SANZO Date of Exam: 09/19/2019 Medical Rec #:  [id number]     Height:       67.0 in Accession #:    [id number]   Weight:       235.7 lb Date of Birth:  [birth year]    BSA:          2.168 m Patient Age:    48 years     BP:           119/66 mmHg Patient Gender: F            HR:           89 bpm. Exam Location:  Inpatient Procedure: 2D Echo, Cardiac Doppler and Color Doppler Indications:    Stroke 434.91/I163.9  History:        Patient has prior history of Echocardiogram examinations, most                 recent 06/22/2019. Risk Factors:Hypertension, Diabetes, Former                 Smoker, Dyslipidemia and Sleep Apnea. ESRD.  Sonographer:    Clayton Lefort RDCS (AE) Referring Phys: [place]  1. Left ventricular ejection fraction, by estimation, is 55 to 60%. The left ventricle has normal function. The left ventricle has no regional wall motion abnormalities. There is moderate asymmetric left ventricular hypertrophy of the basal and septal segments. Left ventricular diastolic parameters are consistent with Grade I diastolic dysfunction (impaired relaxation).  2. Right ventricular systolic function is normal. The right ventricular size is normal. There is normal pulmonary artery systolic pressure.  3. The mitral valve is normal in structure. No evidence of mitral valve regurgitation. No evidence of mitral stenosis.  4. The aortic valve is tricuspid. Aortic valve regurgitation is not visualized. Mild to moderate aortic valve sclerosis/calcification is present, without any evidence of aortic stenosis.  5. The inferior vena cava is normal in size with greater than 50% respiratory variability, suggesting right atrial pressure of 3 mmHg. FINDINGS  Left Ventricle: Left ventricular ejection fraction, by estimation, is 55 to 60%. The left ventricle has normal function. The left ventricle has no regional wall motion abnormalities. The left ventricular internal cavity size was normal in size. There is  moderate asymmetric left ventricular hypertrophy of the basal and septal  segments. Left ventricular diastolic parameters are consistent with Grade I diastolic dysfunction (impaired relaxation). Right Ventricle: The right ventricular size is normal. No increase in right ventricular wall thickness. Right ventricular systolic function is normal. There is normal pulmonary artery systolic pressure. The tricuspid regurgitant velocity is 2.56 m/s, and  with an assumed right atrial pressure of 8 mmHg, the estimated right ventricular systolic pressure is 35.5 mmHg. Left Atrium: Left atrial size was normal in size. Right Atrium: Right atrial size was normal  in size. Pericardium: There is no evidence of pericardial effusion. Mitral Valve: The mitral valve is normal in structure. There is mild thickening of the mitral valve leaflet(s). There is mild calcification of the mitral valve leaflet(s). Normal mobility of the mitral valve leaflets. Moderate mitral annular calcification. No evidence of mitral valve regurgitation. No evidence of mitral valve stenosis. MV peak gradient, 4.8 mmHg. The mean mitral valve gradient is 2.0 mmHg. Tricuspid Valve: The tricuspid valve is normal in structure. Tricuspid valve regurgitation is mild . No evidence of tricuspid stenosis. Aortic Valve: The aortic valve is tricuspid. Aortic valve regurgitation is not visualized. Mild to moderate aortic valve sclerosis/calcification is present, without any evidence of aortic stenosis. Aortic valve mean gradient measures 4.0 mmHg. Aortic valve peak gradient measures 8.0 mmHg. Aortic valve area, by VTI measures 3.44 cm. Pulmonic Valve: The pulmonic valve was normal in structure. Pulmonic valve regurgitation is not visualized. No evidence of pulmonic stenosis. Aorta: The aortic root is normal in size and structure. Venous: The inferior vena cava is normal in size with greater than 50% respiratory variability, suggesting right atrial pressure of 3 mmHg. IAS/Shunts: No atrial level shunt detected by color flow Doppler.  LEFT  VENTRICLE PLAX 2D LVIDd:         4.00 cm  Diastology LVIDs:         2.70 cm  LV e' lateral:   5.55 cm/s LV PW:         1.80 cm  LV E/e' lateral: 14.1 LV IVS:        1.80 cm  LV e' medial:    4.79 cm/s LVOT diam:     2.30 cm  LV E/e' medial:  16.4 LV SV:         91 LV SV Index:   42 LVOT Area:     4.15 cm  RIGHT VENTRICLE             IVC RV Basal diam:  3.50 cm     IVC diam: 1.10 cm RV S prime:     11.50 cm/s TAPSE (M-mode): 2.1 cm LEFT ATRIUM             Index       RIGHT ATRIUM           Index LA diam:        3.20 cm 1.48 cm/m  RA Area:     15.50 cm LA Vol (A2C):   75.0 ml 34.59 ml/m RA Volume:   41.70 ml  19.23 ml/m LA Vol (A4C):   47.9 ml 22.09 ml/m LA Biplane Vol: 63.1 ml 29.10 ml/m  AORTIC VALVE AV Area (Vmax):    3.06 cm AV Area (Vmean):   3.02 cm AV Area (VTI):     3.44 cm AV Vmax:           141.00 cm/s AV Vmean:          94.900 cm/s AV VTI:            0.263 m AV Peak Grad:      8.0 mmHg AV Mean Grad:      4.0 mmHg LVOT Vmax:         104.00 cm/s LVOT Vmean:        69.000 cm/s LVOT VTI:          0.218 m LVOT/AV VTI ratio: 0.83  AORTA Ao Root diam: 3.50 cm Ao Asc diam:  3.10 cm MITRAL VALVE  TRICUSPID VALVE MV Area (PHT): 1.25 cm     TR Peak grad:   26.2 mmHg MV Peak grad:  4.8 mmHg     TR Vmax:        256.00 cm/s MV Mean grad:  2.0 mmHg MV Vmax:       1.09 m/s     SHUNTS MV Vmean:      69.7 cm/s    Systemic VTI:  0.22 m MV Decel Time: 607 msec     Systemic Diam: 2.30 cm MV E velocity: 78.50 cm/s MV A velocity: 108.00 cm/s MV E/A ratio:  0.73 Jenkins Rouge MD Electronically signed by Jenkins Rouge MD Signature Date/Time: 09/19/2019/10:12:49 AM    Final    CT HEAD CODE STROKE WO CONTRAST  Result Date: 09/18/2019 CLINICAL DATA:  Code stroke. Acute stroke with left-sided weakness and left facial droop. Slurred speech. EXAM: CT HEAD WITHOUT CONTRAST TECHNIQUE: Contiguous axial images were obtained from the base of the skull through the vertex without intravenous contrast. COMPARISON:   01/18/2012. FINDINGS: Brain: Chronic small-vessel ischemic changes of the hemispheric white matter. 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 No sign of acute infarction, mass lesion, hemorrhage, hydrocephalus or extra-axial collection. Vascular: There is atherosclerotic calcification of the major vessels at the base of the brain. Skull: Negative Sinuses/Orbits: Clear/normal Other: None ASPECTS ([place] Stroke Program Early CT Score) - Ganglionic level infarction (caudate, lentiform nuclei, internal capsule, insula, M1-M3 cortex): 7 - Supraganglionic infarction (M4-M6 cortex): 3 Total score (0-10 with 10 being normal): 10 IMPRESSION: 1. No acute finding. Mild chronic small-vessel change of the hemispheric white matter. 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 2. ASPECTS is 10. 3. These results were communicated to Dr. Leonel Ramsay at 12:38 pmon 5/15/2021by text page via the [facility] messaging system. Electronically Signed   By: Nelson Chimes M.D.   On: 09/18/2019 12:39    Assessment: 59 y.o. female with PMH of ESRD on hemodialysis, hypertension, hyperlipidemia, diabetes, recent stroke 3 weeks ago and currently at inpatient rehab had whole body weakness and nausea vomiting yesterday am.  MRI overnight showed new infarct at left ACA territory and again right periventricular white matter DWI signal at same region was last time, concerning for recurrent infarct at same site. Checking patient vitals for the last 1 week, it showed almost every day in the morning 5 AM patient had low BP at 90s. She report occasional palpitation feeling but not frequent.  Pt stroke etiology uncertain but could be due to: 1. Morning hypotension - For the last 1 week, almost every day in the morning 5 AM patient had low BP at 90s. Pt right periventricular same spot recurrent infarct also supports hypotension as etiology. Pt is on metoprolol 12.37m bid, will d/c the night dose of metoprolol 2. Carotid  athrosclerosis - CTA head and neck 09/2019 showed b/l bulb and siphon athero, especially left ICA bulb mix plaque without significant stenosis.  Left ACA infarct may coming from left ICA bulb.  Will increase aspirin 81 to 325, continue Plavix and statin. 3.  Occult A. Fib - recommend 30-day cardiac event monitoring at last discharge.  Patient reported occasional heart palpitation, recommend outpatient TEE and loop recorder on discharge.  Patient is in agreement.  Plan: -Discontinue night dose of metoprolol, continue morning dose metoprolol -BP monitoring, avoid low BP during dialysis. -Increase aspirin from 81 to 325, continue Plavix and statin. Recommend DAPT for additional 3 weeks and then plavix alone. -Outpatient TEE and loop recorder -Continue  PT/OT as per rehab team -Neurology will sign off. Please call with questions. Pt will follow up with stroke clinic Dr. Leonie Man at [facility] in about 4 weeks.   Thank you for this consultation and allowing [place] to participate in the care of this patient.  Rosalin Hawking, MD PhD Stroke Neurology 10/08/2019 1:29 PM

## 2019-10-08 NOTE — Progress Notes (Signed)
MRI results of 10/07/2019 discussed with neurology services Dr.Arora and plan is for neurology follow-up question need for possible TEE.  Await further recommendations.

## 2019-10-08 NOTE — Progress Notes (Signed)
[place] PHYSICAL MEDICINE & REHABILITATION PROGRESS NOTE   Subjective/Complaints:  Reviewed MRI Nausea improved , no further vomiting , no abd pain   Review of systems -  Pt denies SOB, abd pain, CP, +N/V and vision changes   Objective:   DG Abd 1 View  Result Date: 10/07/2019 CLINICAL DATA:  Nausea and vomiting EXAM: ABDOMEN - 1 VIEW COMPARISON:  Sep 22, 2015 FINDINGS: There is moderate stool in the colon. There is no bowel dilatation or air-fluid level to suggest bowel obstruction. No free air. There are surgical clips in the gallbladder fossa region. IMPRESSION: Moderate stool in colon.  No evident bowel obstruction or free air. Electronically Signed   By: Lowella Grip III M.D.   On: 10/07/2019 11:56   MR BRAIN WO CONTRAST  Result Date: 10/07/2019 CLINICAL DATA:  Stroke, follow-up EXAM: MRI HEAD WITHOUT CONTRAST TECHNIQUE: Multiplanar, multiecho pulse sequences of the brain and surrounding structures were obtained without intravenous contrast. COMPARISON:  09/20/2019. FINDINGS: Axial and coronal diffusion-weighted imaging performed. New focus of restricted diffusion in the parasagittal left frontal lobe abutting the left lateral ventricle. There is persistent restricted diffusion in the right periatrial white matter. Near complete resolution of diffusion hyperintensity in the left cerebellum. IMPRESSION: New acute infarct of the parasagittal left frontal lobe in the ACA territory. Persistent restricted diffusion in the right periatrial white matter suggesting further acute ischemia. Near complete resolution of abnormal diffusion signal in the left cerebellum. Electronically Signed   By: Macy Mis M.D.   On: 10/07/2019 18:16   Recent Labs    10/06/19 1356 10/07/19 1121  WBC 6.7 8.2  HGB 9.3* 12.4  HCT 32.5* 42.2  PLT 350 410*   Recent Labs    10/06/19 1357 10/07/19 1021  NA 134* 135  K 4.2 4.4  CL 95* 93*  CO2 28 27  GLUCOSE 243* 184*  BUN 23* 10  CREATININE  9.63* 6.76*  CALCIUM 9.3 10.0    Intake/Output Summary (Last 24 hours) at 10/08/2019 0807 Last data filed at 10/07/2019 1730 Gross per 24 hour  Intake 240 ml  Output --  Net 240 ml     Physical Exam: Vital Signs Blood pressure 101/61, pulse 98, temperature 98.1 F (36.7 C), resp. rate 18, height 5' 9.02" (1.753 m), weight 103.4 kg, SpO2 97 %.  General: No acute distress Mood and affect are appropriate Heart: Regular rate and rhythm no rubs murmurs or extra sounds Lungs: Clear to auscultation, breathing unlabored, no rales or wheezes Abdomen: Positive bowel sounds, soft nontender to palpation, nondistended Extremities: No clubbing, cyanosis, or edema Skin: No evidence of breakdown, no evidence of rash  Neurologic:, motor strength is 5/5 in Right 4-/5 Left  deltoid, bicep, tricep, grip, 4- hip flexor, knee extensors, ankle dorsiflexor and plantar flexor unchanged No evidence of nystagmus Sensory exam normal sensation to light touch in bilateral upper and lower extremities Good fine motor No dysmetria with L FNF , +bradykinesia  Musculoskeletal: No pain with upper limb or lower limb active assistive range of motion   Assessment/Plan: 1. 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 also had a left cerebellar stroke Will hold on D/C ,    Care Tool:  Bathing    Body parts bathed by patient: Left arm, Chest, Abdomen, Front perineal area, Right upper leg, Left upper leg, Face, Right lower leg, Buttocks, Left lower leg, Right arm   Body parts bathed by helper: Right arm  Bathing assist Assist Level: Supervision/Verbal cueing     Upper Body Dressing/Undressing Upper body dressing   What is the patient wearing?: Pull over shirt    Upper body assist Assist Level: Supervision/Verbal cueing    Lower Body Dressing/Undressing Lower body dressing      What is the patient wearing?: Pants     Lower body assist Assist for  lower body dressing: Supervision/Verbal cueing     Toileting Toileting Toileting Activity did not occur (Clothing management and hygiene only): N/A (no void or bm)  Toileting assist Assist for toileting: Supervision/Verbal cueing     Transfers Chair/bed transfer  Transfers assist     Chair/bed transfer assist level: Independent with assistive device Chair/bed transfer assistive device: Museum/gallery exhibitions officer assist      Assist level: Supervision/Verbal cueing Assistive device: Walker-rolling Max distance: 150 ft   Walk 10 feet activity   Assist     Assist level: Supervision/Verbal cueing Assistive device: Walker-rolling   Walk 50 feet activity   Assist Walk 50 feet with 2 turns activity did not occur: Safety/medical concerns  Assist level: Supervision/Verbal cueing Assistive device: Walker-rolling    Walk 150 feet activity   Assist Walk 150 feet activity did not occur: Safety/medical concerns  Assist level: Supervision/Verbal cueing Assistive device: Walker-rolling    Walk 10 feet on uneven surface  activity   Assist     Assist level: Supervision/Verbal cueing Assistive device: Aeronautical engineer Will patient use wheelchair at discharge?: No             Wheelchair 50 feet with 2 turns activity    Assist            Wheelchair 150 feet activity     Assist          Blood pressure 101/61, pulse 98, temperature 98.1 F (36.7 C), resp. rate 18, height 5' 9.02" (1.753 m), weight 103.4 kg, SpO2 97 %.  Medical Problem List and Plan: 1.Left-sided weakness and dysarthriasecondary to 2 small infarcts left cerebellar deep nucleusand posterior to the atrium of the right lateral ventricle most likely separate small vessel disease. Plan 30-day cardiac event monitor as outpatient New Left ACA infarct, outside prior CVA distribution (Left cerebellar, VB system)  -patient  may shower Will resume PT, OT probably home next week   Nausea- emesis, resolved, KUB moderate stool burden , no abd pain , no ileus, UA not obtained, oliguric from ESRD 2. Antithrombotics: -DVT/anticoagulation:Subcutaneous heparin- no lovenox due to HD, may d/c once amb 100', went 150' yesterday  -antiplatelet therapy: Aspirin and Plavix x3 weeks then Plavix alone- Neuro may want to extend this or alter antithrombotic regimen  3. Pain Management:Tylenol as needed 4. Mood:Provide emotional support -antipsychotic agents: N/A 5. Neuropsych: This patientiscapable of making decisions on herown behalf. 6. Skin/Wound Care:Routine skin checks 7. Fluids/Electrolytes/Nutrition:Routine in and outs with follow-up chemistries- Hypokalemia nephro to manage with HD  8. End-stage renal disease. Continue hemodialysis as directed by Nephro- resume in hospital HD  9. Hyperlipidemia. Lipitor 10. Diet controlled diabetes mellitus. Hemoglobin A1c 6.7. Blood sugar checks currently discontinued  11. Permissive hypertension/tachycardia. Lopressor initiated 50mg  twice daily 09/22/2019. Monitor with increased mobility and titrate as required.   Vitals:   10/07/19 2115 10/08/19 0520  BP: (!) 158/75 101/61  Pulse: 100 98  Resp: 20 18  Temp: 98.1 F (36.7 C) 98.1 F (36.7 C)  SpO2:  97%  BP controlled 6/4  12. UTI,   5/22- Last WBC was 17.2- will recheck in AM- no urine Cx was able to be gotten by nursing- will con't to try - on no ABX- will monitor  5/23- Nursing got Cx- is staph capitus >100k- WBC is down to 12.2 from >17k- afebrile; pt feels good- sens to oxacillin, 7d course amoxil   13.  HypoK- improved per BMET 6/2 LOS: 15 days A FACE TO [place] 10/08/2019, 8:07 AM

## 2019-10-08 NOTE — Plan of Care (Signed)
Notified by the rehab team of new strokes on MRI. Currently on DAPT for 21d from prior stroke. Seen by Dr.Xu prior to transfer to rehab. I will request Dr. Erlinda Hong to follow-up on rounds.  -- Amie Portland, MD [facility]

## 2019-10-08 NOTE — Significant Event (Signed)
Patient came back from dialysis , vitals checked and  high rate was elevated. Dr Posey Pronto was informed, EKG was done,and one does of Metoprolol 12.5 was ordered , continue to monitor her vitals signs.

## 2019-10-08 NOTE — Plan of Care (Signed)
  Problem: Consults Goal: RH STROKE PATIENT EDUCATION Description: See Patient Education module for education specifics  Outcome: Progressing Goal: Nutrition Consult-if indicated Outcome: Progressing   Problem: RH BOWEL ELIMINATION Goal: RH STG MANAGE BOWEL WITH ASSISTANCE Description: STG Manage Bowel with Assistance. Outcome: Progressing Flowsheets (Taken 10/08/2019 1443) STG: Pt will manage bowels with assistance: 4-Minimum assistance Goal: RH STG MANAGE BOWEL W/MEDICATION W/ASSISTANCE Description: STG Manage Bowel with Medication with Assistance. Outcome: Progressing Flowsheets (Taken 10/08/2019 1443) STG: Pt will manage bowels with medication with assistance: 4-Minimal assistance   Problem: RH BLADDER ELIMINATION Goal: RH STG MANAGE BLADDER WITH ASSISTANCE Description: STG Manage Bladder With Assistance Outcome: Progressing Flowsheets (Taken 10/08/2019 1443) STG: Pt will manage bladder with assistance: 4-Minimal assistance Goal: RH STG MANAGE BLADDER WITH MEDICATION WITH ASSISTANCE Description: STG Manage Bladder With Medication With Assistance. Outcome: Progressing Flowsheets (Taken 10/08/2019 1443) STG: Pt will manage bladder with medication with assistance: 4-Minimal assistance   Problem: RH SKIN INTEGRITY Goal: RH STG SKIN FREE OF INFECTION/BREAKDOWN Outcome: Progressing Goal: RH STG ABLE TO PERFORM INCISION/WOUND CARE W/ASSISTANCE Description: STG Able To Perform Incision/Wound Care With Assistance. Outcome: Progressing   Problem: RH SAFETY Goal: RH STG ADHERE TO SAFETY PRECAUTIONS W/ASSISTANCE/DEVICE Description: STG Adhere to Safety Precautions With Assistance/Device. Outcome: Progressing Flowsheets (Taken 10/08/2019 1443) STG:Pt will adhere to safety precautions with assistance/device: 4-Minimal assistance   Problem: RH COGNITION-NURSING Goal: RH STG USES MEMORY AIDS/STRATEGIES W/ASSIST TO PROBLEM SOLVE Description: STG Uses Memory Aids/Strategies With Assistance to  Problem Solve. Outcome: Progressing   Problem: RH PAIN MANAGEMENT Goal: RH STG PAIN MANAGED AT OR BELOW PT'S PAIN GOAL Outcome: Progressing   Problem: RH KNOWLEDGE DEFICIT Goal: RH STG INCREASE KNOWLEDGE OF DIABETES Outcome: Progressing Goal: RH STG INCREASE KNOWLEDGE OF HYPERTENSION Outcome: Progressing

## 2019-10-08 NOTE — Procedures (Signed)
   I was present at this dialysis session, have reviewed the session itself and made  appropriate changes Kelly Splinter MD [place] pager [phone]   10/08/2019, 3:08 PM

## 2019-10-09 ENCOUNTER — Inpatient Hospital Stay (HOSPITAL_COMMUNITY): Payer: BC Managed Care – PPO | Admitting: Physical Therapy

## 2019-10-09 ENCOUNTER — Inpatient Hospital Stay (HOSPITAL_COMMUNITY): Payer: BC Managed Care – PPO | Admitting: Occupational Therapy

## 2019-10-09 DIAGNOSIS — Z992 Dependence on renal dialysis: Secondary | ICD-10-CM

## 2019-10-09 DIAGNOSIS — R000 Tachycardia, unspecified: Secondary | ICD-10-CM

## 2019-10-09 DIAGNOSIS — I63342 Cerebral infarction due to thrombosis of left cerebellar artery: Secondary | ICD-10-CM

## 2019-10-09 DIAGNOSIS — R0989 Other specified symptoms and signs involving the circulatory and respiratory systems: Secondary | ICD-10-CM

## 2019-10-09 DIAGNOSIS — N186 End stage renal disease: Secondary | ICD-10-CM

## 2019-10-09 MED ORDER — METOPROLOL SUCCINATE ER 25 MG PO TB24
12.5000 mg | ORAL_TABLET | Freq: Every day | ORAL | Status: DC
  Administered 2019-10-10 – 2019-10-12 (×3): 12.5 mg via ORAL
  Filled 2019-10-09 (×3): qty 1

## 2019-10-09 NOTE — Evaluation (Signed)
Occupational Therapy Assessment and Plan  Patient Details  Name: [name] MRN: [id number] Date of Birth: 01-25-1961  OT Diagnosis: hemiplegia affecting non-dominant side and muscle weakness (generalized) Rehab Potential: Rehab Potential (ACUTE ONLY): Excellent ELOS: 3-5 days   Today's Date: 10/09/2019 OT Individual Time: 9562-1308 OT Individual Time Calculation (min): 55 min     Problem List:  Patient Active Problem List   Diagnosis Date Noted  . 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[place])   . Leukocytosis   . Left-sided weakness   . Acute ischemic stroke ([place]) 09/19/2019  . Stroke ([place]) 09/18/2019  . Intractable nausea and vomiting 04/20/2019  . Atypical chest pain 04/02/2019  . Type 2 diabetes mellitus without complications ([place]) 65/78/4696  . Anemia in chronic kidney disease 02/19/2019  . ESRD (end stage renal disease) ([place]) 02/19/2019  . Secondary hyperparathyroidism of renal origin ([place]) 02/19/2019  . Coagulation defect, unspecified ([place]) 02/19/2019  . Venous (peripheral) insufficiency 08/06/2017  . Encounter for screening for respiratory tuberculosis 01/01/2016  . Hypertension   . 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 Diabetic gastroparesis ([place]) 08/09/2015  . Low back pain 06/09/2014  . Nausea and vomiting 01/18/2012  . Diarrhea, unspecified 01/18/2012  . Obstructive sleep apnea of adult 01/16/2012  . GERD (gastroesophageal reflux disease) 01/16/2012  . Osteoarthritis of both knees 01/16/2012  . Hyperlipidemia, unspecified 07/18/2011  . LEIOMYOMA, UTERUS 01/14/2007  . Morbid obesity ([place]) 07/03/2006  . Former smoker 07/03/2006  . Tension headache 07/03/2006    Past Medical History:  Past Medical History:  Diagnosis Date  . Accelerated hypertension   . Aortic atherosclerosis ([place])   . Arthritis of knee    bilateral  . Diabetes mellitus    type  2 - no meds  . Diabetic gastroparesis ([place])   . Diverticulosis   . ESRD (end stage renal disease) ([place])   . Fibroid uterus   . GERD (gastroesophageal reflux disease)   . Hyperlipidemia   . Hypertension   . HYPERTENSION, BENIGN SYSTEMIC 07/03/2006        . Hypertensive emergency 02/05/2019  . Hypokalemia   . IDA (iron deficiency anemia)   . Nausea   . Renal disorder   . Umbilical hernia   . Wears dentures    Past Surgical History:  Past Surgical History:  Procedure Laterality Date  . AV FISTULA PLACEMENT Left 02/17/2019   Procedure: ARTERIOVENOUS (AV) FISTULA CREATION LEFT UPPER ARM;  Surgeon: Waynetta Sandy, MD;  Location: [facility];  Service: Vascular;  Laterality: Left;  . CATARACT EXTRACTION     right eye  . CESAREAN SECTION     x2  . CHOLECYSTECTOMY     laparoscopic  . FISTULA SUPERFICIALIZATION Left 04/06/2019   Procedure: FISTULA SUPERFICIALIZATION LEFT BRACHIOCEPHALIC;  Surgeon: Waynetta Sandy, MD;  Location: [facility];  Service: Vascular;  Laterality: Left;  Transposition left arm brachiocephalic fistula.  . IR FLUORO GUIDE CV LINE RIGHT  02/09/2019  . IR FLUORO GUIDE CV LINE RIGHT  03/05/2019  . IR US GUIDE VASC ACCESS RIGHT  02/09/2019  . MULTIPLE TOOTH EXTRACTIONS    . REDUCTION MAMMAPLASTY Bilateral     Assessment & Plan Clinical Impression: [name] is a 59 y.o. African American female with PMH of ESRD on hemodialysis, hypertension, hyperlipidemia, diabetes had recent stroke 3 weeks ago and currently at inpatient rehab.  She was admitted on 09/18/2019 for left-sided weakness  slurred speech and facial droop.  CT no acute finding, but old frontal cortical and subcortical infarction.  MRI showed small left cerebellum infarct as well as punctate periventricular white matter infarct at right lateral ventricle.  CTA head and neck no LVO, atherosclerotic changes at both carotid bifurcation and the siphon regions without significant stenosis.  EF 55 to 60%.   LDL 127, A1c 6.7.  Put on aspirin 81 and Plavix 75 along with Lipitor 40 on discharge.  Recommend 30-day cardiac event monitoring as outpatient to rule out A. Fib.  She has mild left-sided residual, working with PT OT in rehab and has improvement, about to discharge yesterday when she developed whole body weakness and nausea vomiting.  Discharge canceled, had MRI overnight showed new infarct at left ACA territory and again right periventricular white matter DWI signal at same region was last time, concerning for recurrent infarct at same spot.  Overnight, patient nausea vomiting and whole-body weakness much improved, this morning she again at her baseline.  Neurology was consulted for further recommendations.  Checking patient vitals for the last 1 week, it showed almost every day in the morning 5 AM patient had low BP at 90s.  Patient also stated that in the morning when she woke up she felt dizzy but lasting short period of time.  During the day her BP was between 110-140s.  She stated that she had occasional palpitation feeling but not frequent.  She denies any headache, unilateral weakness or numbness, seizure activity or loss of consciousness.  Denies vision changes or speech difficulty.    Patient currently requires min with basic self-care skills secondary to muscle weakness, decreased cardiorespiratoy endurance, unbalanced muscle activation and decreased standing balance and hemiplegia.  Prior to hospitalization, patient could complete BADLs with independent .  Patient will benefit from skilled intervention to increase independence with basic self-care skills prior to discharge home to daughter's apartment. Anticipate patient will require intermittent supervision and follow up home health.  OT - End of Session Endurance Deficit: Yes Endurance Deficit Description: requries a few rest breaks OT Assessment Rehab Potential (ACUTE ONLY): Excellent OT Barriers to Discharge: Medical stability OT  Patient demonstrates impairments in the following area(s): Balance;Safety;Sensory;Motor OT Basic ADL's Functional Problem(s): Grooming;Bathing;Dressing;Toileting OT Advanced ADL's Functional Problem(s): Simple Meal Preparation OT Transfers Functional Problem(s): Toilet;Tub/Shower OT Additional Impairment(s): None OT Plan OT Intensity: Minimum of 1-2 x/day, 45 to 90 minutes OT Frequency: 5 out of 7 days OT Duration/Estimated Length of Stay: 3-5 days OT Treatment/Interventions: Cognitive remediation/compensation;Community reintegration;Discharge planning;DME/adaptive equipment instruction;Functional mobility training;Neuromuscular re-education;Pain management;Patient/family education;Psychosocial support;Self Care/advanced ADL retraining;Therapeutic Activities;Therapeutic Exercise;UE/LE Strength taining/ROM;UE/LE Coordination activities;Balance/vestibular training;Disease mangement/prevention OT Self Feeding Anticipated Outcome(s): No goal OT Basic Self-Care Anticipated Outcome(s): Supervision/setup-Mod I OT Toileting Anticipated Outcome(s): Mod I OT Bathroom Transfers Anticipated Outcome(s): Supervision/setup-Mod I OT Recommendation Recommendations for Other Services: Neuropsych consult Patient destination: Home Follow Up Recommendations: Outpatient OT Equipment Details: Already has 3:1 and TTB ordered Skilled Therapeutic Intervention Skilled OT session completed with focus on initial evaluation, education on OT role/POC, and establishment of patient-centered goals.   Pt greeted in bed with no c/o pain. Agreeable to sponge bathe and dress EOB sit<stand today. She completed supine<sit with HOB elevated with vcs to scoot forward to place Lt foot on floor. Pt completed UB self care with setup assistance, able to use Lt arm functionally to wash Rt side and assist with grooming and dressing tasks. CGA for dynamic standing balance during LB self care without AD. Note that pt  was also able to  incorporate the Lt hand when donning pants, Ted hose, and gripper socks without losing grip. CGA for ambulatory transfer to chair placed in front of sink, once again not using AD. Pt completed oral care with setup before practicing ambulatory toilet and TTB transfers to further assess functional transfers. Pt very pleased that her functional status hasn't greatly changed since her new CVAs. She then returned to the room and transferred to the w/c. Encouraged her to continue with her Lt hand FMC/strengthening exercise programs on her table. Pt agreeable. Left pt with all needs within reach and chair alarm set.    OT Evaluation Precautions/Restrictions  Precautions Precautions: Fall Precaution Comments: L hemiparesis/watch BP Restrictions Weight Bearing Restrictions: No Pain Pain Assessment Pain Scale: 0-10 Pain Score: 0-No pain(but does have nausea) Home Living/Prior Functioning Home Living Family/patient expects to be discharged to:: Private residence Living Arrangements: Children Available Help at Discharge: Family, Available 24 hours/day Type of Home: Apartment Home Access: Stairs to enter [employer] of Steps: 2 Entrance Stairs-Rails: None Home Layout: One level Bathroom Shower/Tub: Tub/shower unit, Architectural technologist: Standard Bathroom Accessibility: Yes Additional Comments: Will d/c home with dtr who will provide assistance along with grandson  Lives With: Daughter IADL History Homemaking Responsibilities: Yes Meal Prep Responsibility: Primary Laundry Responsibility: Primary Cleaning Responsibility: Primary Bill Paying/Finance Responsibility: Primary Shopping Responsibility: Primary Current License: Yes Mode of Transportation: (SUV) Occupation: Full time employment Type of Occupation: Houekeeping at [employer] Prior Function Level of Independence: Independent with basic ADLs, Independent with transfers, Independent with homemaking with ambulation, Independent  with gait  Able to Take Stairs?: Yes Driving: Yes Vocation: Full time employment 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[employer], driving   ADL ADL Eating: Not assessed Grooming: Setup Where Assessed-Grooming: Edge of bed Upper Body Bathing: Setup Where Assessed-Upper Body Bathing: Edge of bed Lower Body Bathing: Contact guard Where Assessed-Lower Body Bathing: Edge of bed Upper Body Dressing: Setup Where Assessed-Upper Body Dressing: Edge of bed Lower Body Dressing: Contact guard Where Assessed-Lower Body Dressing: Edge of bed Toileting: Not assessed Toilet Transfer: Therapist, music Method: Ambulating(without AD) Gaffer Transfer: Curator Method: Ambulating(without AD) Youth worker: Radio broadcast assistant Vision Baseline Vision/History: Wears glasses Wears Glasses: Reading only Patient Visual Report: No change from baseline Perception  Perception: Within Functional Limits Praxis Praxis: Intact Cognition Overall Cognitive Status: Within Functional Limits for tasks assessed Arousal/Alertness: Awake/alert Orientation Level: Person;Place;Situation Person: Oriented Place: Oriented Situation: Oriented Year: 2021 Month: May Day of Week: Correct Memory: Impaired Immediate Memory Recall: Sock;Blue;Bed Memory Recall Sock: Without Cue Memory Recall Blue: Without Cue Memory Recall Bed: Without Cue Attention: Focused;Sustained Focused Attention: Appears intact Sustained Attention: Appears intact Problem Solving: Appears intact Safety/Judgment: Appears intact Sensation Sensation Light Touch: Impaired Detail(Pt reports numbness in the Lt UE/LE, no change since CIR admission) Hot/Cold: Not tested Proprioception: Impaired by gross assessment(impaired awareness of LE position during gait) Stereognosis: Not tested Coordination Gross Motor  Movements are Fluid and Coordinated: Yes Fine Motor Movements are Fluid and Coordinated: Yes Coordination and Movement Description: still with mild L LE paresis but movements are fluid and coordinated Finger Nose Finger Test: Slighty dysmetria on the Lt side, WNL Rt Motor  Motor Motor: Hemiplegia Motor - Skilled Clinical Observations: mild L LE paresis Mobility  CGA ambulatory toilet + TTB transfer without AD Trunk/Postural Assessment  Cervical Assessment Cervical Assessment: Within Functional Limits Thoracic Assessment  Thoracic Assessment: Within Functional Limits Lumbar Assessment Lumbar Assessment: Within Functional Limits Postural Control Postural Control: Within Functional Limits  Balance Balance Balance Assessed: Yes Dynamic Sitting Balance Dynamic Sitting - Balance Support: During functional activity Dynamic Sitting - Level of Assistance: 5: Stand by assistance (donning Ted hose EOB) Dynamic Standing Balance Dynamic Standing - Balance Support: During functional activity Dynamic Standing - Level of Assistance: 4: Min assist Dynamic Standing - Balance Activities: Lateral lean/weight shifting;Forward lean/weight shifting (ambulation to toilet without AD) Extremity/Trunk Assessment RUE Assessment RUE Assessment: Within Functional Limits Active Range of Motion (AROM) Comments: WFL General Strength Comments: 4-/5 LUE Assessment LUE Assessment: Within Functional Limits Active Range of Motion (AROM) Comments: ~110 degrees shoulder flexion, ~90 degrees abduction, other ranges WNL General Strength Comments: 3-/5 to 3+/5 grossly   Refer to Care Plan for Long Term Goals  Recommendations for other services: Neuropsych and Therapeutic Recreation  Other leisure pursuits   Discharge Criteria: Patient will be discharged from OT if patient refuses treatment 3 consecutive times without medical reason, if treatment goals not met, if there is a change in medical status, if patient  makes no progress towards goals or if patient is discharged from hospital.  The above assessment, treatment plan, treatment alternatives and goals were discussed and mutually agreed upon: by patient  Skeet Simmer 10/09/2019, 12:36 PM

## 2019-10-09 NOTE — Plan of Care (Signed)
  Problem: RH Balance Goal: LTG Patient will maintain dynamic standing with ADLs (OT) Description: LTG:  Patient will maintain dynamic standing balance with assist during activities of daily living (OT)  Flowsheets (Taken 10/09/2019 1538) LTG: Pt will maintain dynamic standing balance during ADLs with: Independent with assistive device   Problem: Sit to Stand Goal: LTG:  Patient will perform sit to stand in prep for activites of daily living with assistance level (OT) Description: LTG:  Patient will perform sit to stand in prep for activites of daily living with assistance level (OT) Flowsheets (Taken 10/09/2019 1538) LTG: PT will perform sit to stand in prep for activites of daily living with assistance level: Independent with assistive device   Problem: RH Grooming Goal: LTG Patient will perform grooming w/assist,cues/equip (OT) Description: LTG: Patient will perform grooming with assist, with/without cues using equipment (OT) Flowsheets (Taken 10/09/2019 1538) LTG: Pt will perform grooming with assistance level of: Independent with assistive device    Problem: RH Bathing Goal: LTG Patient will bathe all body parts with assist levels (OT) Description: LTG: Patient will bathe all body parts with assist levels (OT) Flowsheets (Taken 10/09/2019 1538) LTG: Pt will perform bathing with assistance level/cueing: Set up assist    Problem: RH Dressing Goal: LTG Patient will perform upper body dressing (OT) Description: LTG Patient will perform upper body dressing with assist, with/without cues (OT). Flowsheets (Taken 10/09/2019 1538) LTG: Pt will perform upper body dressing with assistance level of: Independent with assistive device Goal: LTG Patient will perform lower body dressing w/assist (OT) Description: LTG: Patient will perform lower body dressing with assist, with/without cues in positioning using equipment (OT) Flowsheets (Taken 10/09/2019 1538) LTG: Pt will perform lower body dressing with  assistance level of: Independent with assistive device   Problem: RH Toileting Goal: LTG Patient will perform toileting task (3/3 steps) with assistance level (OT) Description: LTG: Patient will perform toileting task (3/3 steps) with assistance level (OT)  Flowsheets (Taken 10/09/2019 1538) LTG: Pt will perform toileting task (3/3 steps) with assistance level: Independent with assistive device   Problem: RH Toilet Transfers Goal: LTG Patient will perform toilet transfers w/assist (OT) Description: LTG: Patient will perform toilet transfers with assist, with/without cues using equipment (OT) Flowsheets (Taken 10/09/2019 1538) LTG: Pt will perform toilet transfers with assistance level of: Independent with assistive device   Problem: RH Tub/Shower Transfers Goal: LTG Patient will perform tub/shower transfers w/assist (OT) Description: LTG: Patient will perform tub/shower transfers with assist, with/without cues using equipment (OT) Flowsheets (Taken 10/09/2019 1538) LTG: Pt will perform tub/shower stall transfers with assistance level of: Set up assist

## 2019-10-09 NOTE — Progress Notes (Signed)
[facility] PHYSICAL MEDICINE & REHABILITATION PROGRESS NOTE   Subjective/Complaints: Patient seen laying in bed this AM.  She states she slept well overnight. Overnight she was noted to be tachycardic, metoprolol ordered.  Discussed with nursing, Neurology notes reviewed from yesterday, metoprolol decreased. Signed off.  Review of systems: Denies CP, SOB, N/V/D  Objective:   MR BRAIN WO CONTRAST  Result Date: 10/07/2019 CLINICAL DATA:  Stroke, follow-up EXAM: MRI HEAD WITHOUT CONTRAST TECHNIQUE: Multiplanar, multiecho pulse sequences of the brain and surrounding structures were obtained without intravenous contrast. COMPARISON:  09/20/2019. FINDINGS: Axial and coronal diffusion-weighted imaging performed. New focus of restricted diffusion in the parasagittal left frontal lobe abutting the left lateral ventricle. There is persistent restricted diffusion in the right periatrial white matter. Near complete resolution of diffusion hyperintensity in the left cerebellum. IMPRESSION: New acute infarct of the parasagittal left frontal lobe in the ACA territory. Persistent restricted diffusion in the right periatrial white matter suggesting further acute ischemia. Near complete resolution of abnormal diffusion signal in the left cerebellum. Electronically Signed   By: Macy Mis M.D.   On: 10/07/2019 18:16   Recent Labs    10/06/19 1356 10/07/19 1121  WBC 6.7 8.2  HGB 9.3* 12.4  HCT 32.5* 42.2  PLT 350 410*   Recent Labs    10/06/19 1357 10/07/19 1021  NA 134* 135  K 4.2 4.4  CL 95* 93*  CO2 28 27  GLUCOSE 243* 184*  BUN 23* 10  CREATININE 9.63* 6.76*  CALCIUM 9.3 10.0    Intake/Output Summary (Last 24 hours) at 10/09/2019 1352 Last data filed at 10/09/2019 1300 Gross per 24 hour  Intake 120 ml  Output 700 ml  Net -580 ml     Physical Exam: Vital Signs Blood pressure 105/76, pulse (!) 105, temperature 98.5 F (36.9 C), resp. rate 18, height 5' 9.02" (1.753 m), weight 100.6 kg,  SpO2 100 %. Constitutional: No distress . Vital signs reviewed. HENT: Normocephalic.  Atraumatic. Eyes: EOMI. No discharge. Cardiovascular: No JVD. RRR. Respiratory: Normal effort.  No stridor. Bilaterally clear to auscultation.  GI: Non-distended. Skin: Warm and dry.  Intact. Psych: Normal mood.  Normal behavior. Musc: No edema in extremities.  No tenderness in extremities. Neuro: Alert Motor: RUE/RLE: 4-4+/5 proximal to distal LUE/LLE: 4/5 proximal to distal  Assessment/Plan: 1. 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 also had a left cerebellar stroke Will hold on D/C ,    Care Tool:  Bathing    Body parts bathed by patient: Left arm, Chest, Abdomen, Front perineal area, Right upper leg, Left upper leg, Face, Right lower leg, Buttocks, Left lower leg, Right arm   Body parts bathed by helper: Right arm     Bathing assist Assist Level: Contact Guard/Touching assist     Upper Body Dressing/Undressing Upper body dressing   What is the patient wearing?: Pull over shirt    Upper body assist Assist Level: Set up assist    Lower Body Dressing/Undressing Lower body dressing      What is the patient wearing?: Pants     Lower body assist Assist for lower body dressing: Contact Guard/Touching assist     Toileting Toileting Toileting Activity did not occur (Clothing management and hygiene only): N/A (no void or bm)  Toileting assist Assist for toileting: Supervision/Verbal cueing     Transfers Chair/bed transfer  Transfers assist     Chair/bed transfer assist level: Supervision/Verbal cueing Chair/bed transfer assistive device: Environmental consultant  Locomotion Ambulation   Ambulation assist      Assist level: Minimal Assistance - Patient > 75% Assistive device: No Device Max distance: 166ft   Walk 10 feet activity   Assist     Assist level: Minimal Assistance - Patient > 75% Assistive device: No Device    Walk 50 feet activity   Assist Walk 50 feet with 2 turns activity did not occur: Safety/medical concerns  Assist level: Minimal Assistance - Patient > 75% Assistive device: No Device    Walk 150 feet activity   Assist Walk 150 feet activity did not occur: Safety/medical concerns  Assist level: Supervision/Verbal cueing Assistive device: Walker-rolling    Walk 10 feet on uneven surface  activity   Assist     Assist level: Minimal Assistance - Patient > 75% Assistive device: Aeronautical engineer Will patient use wheelchair at discharge?: No             Wheelchair 50 feet with 2 turns activity    Assist            Wheelchair 150 feet activity     Assist          Blood pressure 105/76, pulse (!) 105, temperature 98.5 F (36.9 C), resp. rate 18, height 5' 9.02" (1.753 m), weight 100.6 kg, SpO2 100 %.  Medical Problem List and Plan: 1.Left-sided weakness and dysarthriasecondary to 2 small infarcts left cerebellar deep nucleusand posterior to the atrium of the right lateral ventricle most likely separate small vessel disease. Plan 30-day cardiac event monitor as outpatient New Left ACA infarct, outside prior CVA distribution (Left cerebellar, VB system)   Continue CIR 2. Antithrombotics: -DVT/anticoagulation:Subcutaneous heparin- no lovenox due to HD -antiplatelet therapy: Aspirin and Plavix x3 weeks then Plavix alone-ASA dose increased 3. Pain Management:Tylenol as needed 4. Mood:Provide emotional support -antipsychotic agents: N/A 5. Neuropsych: This patientiscapable of making decisions on herown behalf. 6. Skin/Wound Care:Routine skin checks 7. Fluids/Electrolytes/Nutrition:Routine in and outs 8.  ESRD. Continue hemodialysis as directed by Nephro- resume in hospital HD  9. Hyperlipidemia.  Continue Lipitor 10. Diet controlled diabetes mellitus. Hemoglobin A1c 6.7. Blood sugar  checks currently discontinued  Will consider CBGs again if blood glucose remains elevated 11. Permissive hypertension/tachycardia.  Monitor with increased mobility and titrate as required.  ECG personally reviewed, showing sinus tachycardia Vitals:   10/09/19 0727 10/09/19 1321  BP: (!) 145/67 105/76  Pulse: (!) 105 (!) 105  Resp: 18 18  Temp: 97.7 F (36.5 C) 98.5 F (36.9 C)  SpO2: 100% 100%   Labile BP on 6/5, will attempt to avoid hypoperfusion  Metoprolol changed to Toprol-XL on 6/6 12. UTI: Completed course of amoxicillin  LOS: 16 days A FACE TO FACE EVALUATION WAS PERFORMED  Treson Laura Lorie Phenix 10/09/2019, 1:52 PM

## 2019-10-09 NOTE — Evaluation (Signed)
Physical Therapy Assessment and Plan  Patient Details  Name: [name] MRN: [id number] Date of Birth: 01/10/1961  PT Diagnosis: Abnormality of gait, Difficulty walking, Hemiparesis non-dominant and Muscle weakness Rehab Potential: Excellent ELOS: ~3-5days   Today's Date: 10/09/2019 PT Individual Time: 2992-4268 and [id number] PT Individual Time Calculation (min): 53 min  And 61 min And  Today's Date: 10/09/2019 PT Missed Time: 14 Minutes Missed Time Reason: Patient fatigue  Problem List:  Patient Active Problem List   Diagnosis Date Noted  . 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[place])   . Leukocytosis   . Left-sided weakness   . Acute ischemic stroke ([place]) 09/19/2019  . Stroke ([place]) 09/18/2019  . Intractable nausea and vomiting 04/20/2019  . Atypical chest pain 04/02/2019  . Type 2 diabetes mellitus without complications ([place]) 97/98/9211  . Anemia in chronic kidney disease 02/19/2019  . ESRD (end stage renal disease) ([place]) 02/19/2019  . Secondary hyperparathyroidism of renal origin ([place]) 02/19/2019  . Coagulation defect, unspecified ([place]) 02/19/2019  . Venous (peripheral) insufficiency 08/06/2017  . Encounter for screening for respiratory tuberculosis 01/01/2016  . Hypertension   . 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 Diabetic gastroparesis ([place]) 08/09/2015  . Low back pain 06/09/2014  . Nausea and vomiting 01/18/2012  . Diarrhea, unspecified 01/18/2012  . Obstructive sleep apnea of adult 01/16/2012  . GERD (gastroesophageal reflux disease) 01/16/2012  . Osteoarthritis of both knees 01/16/2012  . Hyperlipidemia, unspecified 07/18/2011  . LEIOMYOMA, UTERUS 01/14/2007  . Morbid obesity ([place]) 07/03/2006  . Former smoker 07/03/2006  . Tension headache 07/03/2006    Past Medical History:  Past Medical History:  Diagnosis Date  . Accelerated hypertension   .  Aortic atherosclerosis ([place])   . Arthritis of knee    bilateral  . Diabetes mellitus    type 2 - no meds  . Diabetic gastroparesis ([place])   . Diverticulosis   . ESRD (end stage renal disease) ([place])   . Fibroid uterus   . GERD (gastroesophageal reflux disease)   . Hyperlipidemia   . Hypertension   . HYPERTENSION, BENIGN SYSTEMIC 07/03/2006        . Hypertensive emergency 02/05/2019  . Hypokalemia   . IDA (iron deficiency anemia)   . Nausea   . Renal disorder   . Umbilical hernia   . Wears dentures    Past Surgical History:  Past Surgical History:  Procedure Laterality Date  . AV FISTULA PLACEMENT Left 02/17/2019   Procedure: ARTERIOVENOUS (AV) FISTULA CREATION LEFT UPPER ARM;  Surgeon: Waynetta Sandy, MD;  Location: [facility];  Service: Vascular;  Laterality: Left;  . CATARACT EXTRACTION     right eye  . CESAREAN SECTION     x2  . CHOLECYSTECTOMY     laparoscopic  . FISTULA SUPERFICIALIZATION Left 04/06/2019   Procedure: FISTULA SUPERFICIALIZATION LEFT BRACHIOCEPHALIC;  Surgeon: Waynetta Sandy, MD;  Location: [facility];  Service: Vascular;  Laterality: Left;  Transposition left arm brachiocephalic fistula.  . IR FLUORO GUIDE CV LINE RIGHT  02/09/2019  . IR FLUORO GUIDE CV LINE RIGHT  03/05/2019  . IR US GUIDE VASC ACCESS RIGHT  02/09/2019  . MULTIPLE TOOTH EXTRACTIONS    . REDUCTION MAMMAPLASTY Bilateral     Assessment & Plan Clinical Impression: Patient is a 59 y.o. year old right-handed female history of diet-controlled diabetes mellitus, hypertension and end-stage renal disease on hemodialysis Monday Wednesday Friday.  Per chart review she lives with her 12 year old son independent prior to admission working full-time at [employer]. 1 level home 5 steps to entry. Multiple family in the area that works. Presented 09/18/2019 with acute onset of left-sided weakness facial droop and slurred speech. Cranial CT scan showed no acute findings. Old right  frontal cortical and subcortical infarct. CT angiogram of head and neck no large vessel or medium vessel occlusion. Patient did not receive TPA. MRI showed a 1 cm acute infarct in the left cerebellum. Subcentimeter acute infarct in the deep white matter just posterior to the atrium of the right lateral ventricle. Admission chemistries BUN 29, creatinine 7.97,troponin high-sensitivity 48-63hemoglobin 9.5. Echocardiogram with ejection fraction of 60% no wall motion abnormalities. Neurology follow-up maintained on aspirin and Plavix x3 weeks then Plavix alone. Subcutaneous heparin for DVT prophylaxis. Plan for 30-day cardiac event monitor as outpatient. Hemodialysis ongoing as per renal services.Permissive hypertension with bouts of tachycardia she was placed on Lopressor '50mg'$  twice daily 09/22/2019.Tolerating a regular diet. Patient transferred to CIR on 09/23/2019 .   Updated H&P from Neurology Note on 6/4: [name] is a 59 y.o. African American female with PMH of ESRD on hemodialysis, hypertension, hyperlipidemia, diabetes had recent stroke 3 weeks ago and currently at inpatient rehab.  She was admitted on 09/18/2019 for left-sided weakness slurred speech and facial droop.  CT no acute finding, but old frontal cortical and subcortical infarction.  MRI showed small left cerebellum infarct as well as punctate periventricular white matter infarct at right lateral ventricle.  CTA head and neck no LVO, atherosclerotic changes at both carotid bifurcation and the siphon regions without significant stenosis.  EF 55 to 60%.  LDL 127, A1c 6.7.  Put on aspirin 81 and Plavix 75 along with Lipitor 40 on discharge.  Recommend 30-day cardiac event monitoring as outpatient to rule out A. Fib.  She has mild left-sided residual, working with PT OT in rehab and has improvement, about to discharge yesterday when she developed whole body weakness and nausea vomiting.  Discharge canceled, had MRI overnight showed new  infarct at left ACA territory and again right periventricular white matter DWI signal at same region was last time, concerning for recurrent infarct at same spot.  Overnight, patient nausea vomiting and whole-body weakness much improved, this morning she again at her baseline.  Neurology was consulted for further recommendations.  Checking patient vitals for the last 1 week, it showed almost every day in the morning 5 AM patient had low BP at 90s.  Patient also stated that in the morning when she woke up she felt dizzy but lasting short period of time.  During the day her BP was between 110-140s.  She stated that she had occasional palpitation feeling but not frequent.  She denies any headache, unilateral weakness or numbness, seizure activity or loss of consciousness.  Denies vision changes or speech difficulty.  Patient currently requires min assist with mobility secondary to muscle weakness, decreased cardiorespiratoy endurance, impaired timing and sequencing, unbalanced muscle activation and decreased coordination and decreased standing balance, decreased postural control and decreased balance strategies.  Prior to hospitalization, patient was independent  with mobility and lived with Daughter(planning on going to daughter's house) in a Apartment(information below for her daughter's house since that is planned D/C location) home.  Home access is 2Stairs to enter.  Patient will benefit from skilled PT intervention to maximize safe functional mobility, minimize fall risk and decrease caregiver burden for planned discharge home with  24 hour supervision.  Anticipate patient will benefit from follow up OP at discharge.  PT - End of Session Activity Tolerance: Tolerates 30+ min activity with multiple rests Endurance Deficit: Yes Endurance Deficit Description: requries a few rest breaks PT Assessment Rehab Potential (ACUTE/IP ONLY): Excellent PT Barriers to Discharge: Home environment  access/layout;Medical stability PT Patient demonstrates impairments in the following area(s): Balance;Perception;Behavior;Safety;Edema;Sensory;Endurance;Skin Integrity;Motor;Nutrition;Pain PT Transfers Functional Problem(s): Bed Mobility;Bed to Chair;Car;Furniture PT Locomotion Functional Problem(s): Ambulation;Stairs PT Plan PT Intensity: Minimum of 1-2 x/day ,45 to 90 minutes PT Frequency: 5 out of 7 days PT Duration Estimated Length of Stay: ~3-5days PT Treatment/Interventions: Ambulation/gait training;Community reintegration;Neuromuscular re-education;DME/adaptive equipment instruction;Psychosocial support;Stair training;UE/LE Strength taining/ROM;Wheelchair propulsion/positioning;Balance/vestibular training;Discharge planning;Functional electrical stimulation;Pain management;Therapeutic Activities;UE/LE Coordination activities;Cognitive remediation/compensation;Functional mobility training;Disease management/prevention;Patient/family education;Splinting/orthotics;Therapeutic Exercise;Visual/perceptual remediation/compensation;Skin care/wound management PT Transfers Anticipated Outcome(s): mod-I PT Locomotion Anticipated Outcome(s): mod-I short distances using LRAD PT Recommendation Follow Up Recommendations: 24 hour supervision/assistance;Outpatient PT Patient destination: Home Equipment Recommended: To be determined   Skilled Therapeutic Intervention Session 1: Re-evaluation completed (see details above and below) with education on PT POC and goals and individual treatment initiated with focus on activity tolerance, transfers, standing balance, gait, and stair navigation. Pt received asleep, supine in bed and upon awakening pt reports she feels nauseous and that it has been ongoing since the new CVA a few days ago. Despite this, pt agreeable to therapy session and very appreciative to get moving. Supine>sitting EOB independently without bed features. Sit<>stands with close supervision for  safety during session. Stand pivot to w/c, no AD, with CGA for safety.  Transported to/from gym in w/c for time management and energy conservation. Gait 35f using RW with close supervision progressed to CGA for safety towards end of walk - distance limited due to fatigue and nausea. HR 119bpm after ambulation only decreasing to 112bpm with seated break. Ambulated ~157fx2 up/down ramp using RW with CGA for steadying and pt ambulating with very slow speed. Simulated ambulatory car transfer (sedan height) using RW with CGA/close supervision - pt demonstrates safe technique without cuing. Ascended/descended 8 steps using B HRs with CGA for steadying and pt performing with reciprocal stepping - limited number of steps due to fatigue. HR 123bpm again only decreasing to ~112bpm with seated rest break. Gait 13044f no AD, with CGA/min assist for balance - cuing for increased L LE foot clearance and step length for symmetrical gait. Pt reports slight improvement in nausea following activity. Transported back to room and left seated in w/c with needs in reach and chair alarm on.  Session 2: Pt received supine in bed with her daughter present and pt agreeable to therapy session though continues to report nausea and fatigue. Therapist confirmed with pt/family that pt is planning to D/C to daughter's house with only 1 curb step to enter the 1story apartment as well as pt's daughter planning to provide 24hr support. Therapist educated pt's daughter on pt's CLOF, use of RW, follow-up therapy recommendations, and the signs/symptoms of stroke ([place]). Pt's daughter present throughout remainder of session for hands-on training. Supine>sitting EOB independently. R stand pivot to w/c, no AD, with CGA.  Transported to/from gym in w/c for time management and energy conservation. Gait training ~79f63fing RW with CGA/close supervision - demonstrates slow gait speed. Pt inquiring about use of rollator to allow seated rest breaks  during community mobility. Therapist educated pt on proper use of rollator and brakes. Gait training ~79ft18fh 4 turns using rollator with CGA and pt demonstrating good  safety awareness and management of AD - no significant change in gait compared to with RW. Ambulated ~32f up/down ramp using rollator with CGA for safety and cuing for use of brakes on descent. Discussed use of RW vs rollator and recommend continuing practice with rollator and likely using that at D/C. Educated pt on proper sequencing of rollator management on/off curb step in preparation for home entry - pt performed x4 reps demonstrating understanding with CGA for steadying and min cuing for use of brakes. Ascended/descending 4 steps using B HRs with CGA for steadying with reciprocal pattern both directions while therapist educated pt's daughter on proper positioning - repeated same task with pt's daughter demonstrating understanding. Pt reporting significant fatigue and requesting to return to room. Transported back in w/c. RN notified of continued elevated HR with activity (same as in earlier session) Per pt request, pt left seated in w/c with needs in reach, chair alarm on, and pt's daughter present. Missed 14 minutes of skilled physical therapy due to fatigue.  PT Evaluation Precautions/Restrictions Precautions Precautions: Fall Precaution Comments: L hemiparesis/watch BP Restrictions Weight Bearing Restrictions: No Pain Pain Assessment Pain Scale: 0-10 Pain Score: 0-No pain(but does have nausea) Home Living/Prior Functioning Home Living Available Help at Discharge: Family;Available 24 hours/day Type of Home: Apartment(information below for her daughter's house since that is planned D/C location) Home Access: Stairs to enter Entrance Stairs-Number of Steps: 2 Entrance Stairs-Rails: None Home Layout: One level  Lives With: Daughter(planning on going to daughter's house) Prior Function Level of Independence: Independent  with basic ADLs;Independent with transfers;Independent with homemaking with ambulation;Independent with gait  Able to Take Stairs?: Yes Driving: Yes Vocation: Full time employment 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[employer] driving   PCytogeneticist Within Functional Limits Praxis Praxis: Intact  Cognition Overall Cognitive Status: Impaired/Different from baseline Arousal/Alertness: Awake/alert(nauseated) Orientation Level: Oriented X4 Attention: Focused;Sustained Focused Attention: Appears intact Sustained Attention: Appears intact Memory: Impaired Safety/Judgment: Appears intact Sensation Sensation Light Touch: Appears Intact Hot/Cold: Not tested Proprioception: Impaired by gross assessment(impaired awareness of LE position during gait) Stereognosis: Not tested Coordination Gross Motor Movements are Fluid and Coordinated: Yes Coordination and Movement Description: still with mild L LE paresis but movements are fluid and coordinated Motor  Motor Motor: Hemiplegia Motor - Skilled Clinical Observations: mild L LE paresis  Mobility Bed Mobility Bed Mobility: Supine to Sit;Sit to Supine;Rolling Right;Rolling Left Rolling Right: Independent Rolling Left: Independent Supine to Sit: Independent Sit to Supine: Independent Transfers Transfers: Sit to Stand;Stand to Sit;Stand Pivot Transfers Sit to Stand: Supervision/Verbal cueing Stand to Sit: Supervision/Verbal cueing Stand Pivot Transfers: Supervision/Verbal cueing Transfer (Assistive device): None Locomotion  Gait Ambulation: Yes Gait Assistance: Contact Guard/Touching assist;Minimal Assistance - Patient > 75% Gait Distance (Feet): 130 Feet Assistive device: None Gait Assistance Details: Tactile cues for weight shifting;Verbal cues for technique;Verbal cues for sequencing;Verbal cues for gait pattern Gait Gait: Yes Gait  Pattern: Impaired Gait Pattern: Decreased stance time - left;Poor foot clearance - left;Decreased step length - left Gait velocity: decreased Stairs / Additional Locomotion Stairs: Yes Stairs Assistance: Supervision/Verbal cueing Stair Management Technique: Two rails Number of Stairs: 8 Height of Stairs: 6 Ramp: Minimal Assistance - Patient >75% Curb: Minimal Assistance - Patient >75% Wheelchair Mobility Wheelchair Mobility: No  Trunk/Postural Assessment  Cervical Assessment Cervical Assessment: Within Functional Limits Thoracic Assessment Thoracic Assessment: Within Functional Limits Lumbar Assessment Lumbar Assessment: Within Functional Limits Postural Control Postural Control: Within Functional  Limits  Balance Balance Balance Assessed: Yes Static Sitting Balance Static Sitting - Balance Support: No upper extremity supported Static Sitting - Level of Assistance: 7: Independent Dynamic Sitting Balance Dynamic Sitting - Balance Support: During functional activity Dynamic Sitting - Level of Assistance: 5: Stand by assistance Static Standing Balance Static Standing - Balance Support: During functional activity Static Standing - Level of Assistance: 5: Stand by assistance Dynamic Standing Balance Dynamic Standing - Balance Support: During functional activity Dynamic Standing - Level of Assistance: 4: Min assist Dynamic Standing - Balance Activities: Lateral lean/weight shifting;Forward lean/weight shifting Extremity Assessment      RLE Assessment RLE Assessment: Within Functional Limits Active Range of Motion (AROM) Comments: WFL General Strength Comments: Grossly 5/5 assessed in sitting LLE Assessment LLE Assessment: Exceptions to [facility] Active Range of Motion (AROM) Comments: WFL LLE Strength Left Hip Flexion: 3+/5 Left Knee Flexion: 3+/5 Left Knee Extension: 3+/5 Left Ankle Dorsiflexion: 3+/5 Left Ankle Plantar Flexion: 3+/5    Refer to Care Plan for Long Term  Goals  Recommendations for other services: None   Discharge Criteria: Patient will be discharged from PT if patient refuses treatment 3 consecutive times without medical reason, if treatment goals not met, if there is a change in medical status, if patient makes no progress towards goals or if patient is discharged from hospital.  The above assessment, treatment plan, treatment alternatives and goals were discussed and mutually agreed upon: by patient  Tawana Scale, PT, DPT 10/09/2019, 7:57 AM

## 2019-10-09 NOTE — Plan of Care (Signed)
  Problem: Consults Goal: RH STROKE PATIENT EDUCATION Description: See Patient Education module for education specifics  Outcome: Progressing   Problem: RH BOWEL ELIMINATION Goal: RH STG MANAGE BOWEL WITH ASSISTANCE Description: STG Manage Bowel with Assistance. Outcome: Progressing Goal: RH STG MANAGE BOWEL W/MEDICATION W/ASSISTANCE Description: STG Manage Bowel with Medication with Assistance. Outcome: Progressing   Problem: RH BLADDER ELIMINATION Goal: RH STG MANAGE BLADDER WITH ASSISTANCE Description: STG Manage Bladder With Assistance Outcome: Progressing Goal: RH STG MANAGE BLADDER WITH MEDICATION WITH ASSISTANCE Description: STG Manage Bladder With Medication With Assistance. Outcome: Progressing Goal: RH STG MANAGE BLADDER WITH EQUIPMENT WITH ASSISTANCE Description: STG Manage Bladder With Equipment With Assistance Outcome: Progressing   Problem: RH SKIN INTEGRITY Goal: RH STG SKIN FREE OF INFECTION/BREAKDOWN Outcome: Progressing Goal: RH STG ABLE TO PERFORM INCISION/WOUND CARE W/ASSISTANCE Description: STG Able To Perform Incision/Wound Care With Assistance. Outcome: Progressing   Problem: RH COGNITION-NURSING Goal: RH STG USES MEMORY AIDS/STRATEGIES W/ASSIST TO PROBLEM SOLVE Description: STG Uses Memory Aids/Strategies With Assistance to Problem Solve. Outcome: Progressing   Problem: RH PAIN MANAGEMENT Goal: RH STG PAIN MANAGED AT OR BELOW PT'S PAIN GOAL Outcome: Progressing   Problem: RH KNOWLEDGE DEFICIT Goal: RH STG INCREASE KNOWLEDGE OF DIABETES Outcome: Progressing Goal: RH STG INCREASE KNOWLEDGE OF HYPERTENSION Outcome: Progressing

## 2019-10-10 ENCOUNTER — Inpatient Hospital Stay (HOSPITAL_COMMUNITY): Payer: BC Managed Care – PPO | Admitting: Occupational Therapy

## 2019-10-10 ENCOUNTER — Inpatient Hospital Stay (HOSPITAL_COMMUNITY): Payer: BC Managed Care – PPO

## 2019-10-10 MED ORDER — CHLORHEXIDINE GLUCONATE CLOTH 2 % EX PADS
6.0000 | MEDICATED_PAD | Freq: Every day | CUTANEOUS | Status: DC
  Administered 2019-10-11: 6 via TOPICAL

## 2019-10-10 NOTE — Progress Notes (Signed)
[facility] PHYSICAL MEDICINE & REHABILITATION PROGRESS NOTE   Subjective/Complaints: Patient seen laying in bed this morning.  She states she slept well overnight.  She denies complaints.  Review of systems: Denies CP, SOB, N/V/D  Objective:   No results found. Recent Labs    10/07/19 1121  WBC 8.2  HGB 12.4  HCT 42.2  PLT 410*   Recent Labs    10/07/19 1021  NA 135  K 4.4  CL 93*  CO2 27  GLUCOSE 184*  BUN 10  CREATININE 6.76*  CALCIUM 10.0    Intake/Output Summary (Last 24 hours) at 10/10/2019 0900 Last data filed at 10/09/2019 1839 Gross per 24 hour  Intake 350 ml  Output --  Net 350 ml     Physical Exam: Vital Signs Blood pressure (!) 98/53, pulse 89, temperature 98.4 F (36.9 C), temperature source Oral, resp. rate 16, height 5' 9.02" (1.753 m), weight 100.2 kg, SpO2 100 %. Constitutional: No distress . Vital signs reviewed. HENT: Normocephalic.  Atraumatic. Eyes: EOMI. No discharge. Cardiovascular: No JVD. Respiratory: Normal effort.  No stridor. GI: Non-distended. Skin: Warm and dry.  Intact. Psych: Normal mood.  Normal behavior. Musc: No edema in extremities.  No tenderness in extremities. Neuro: Alert Motor: RUE/RLE: 4-4+/5 proximal to distal LUE/LLE: 4-4+/5 proximal to distal  Assessment/Plan: 1. 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 also had a left cerebellar stroke Will hold on D/C ,    Care Tool:  Bathing    Body parts bathed by patient: Left arm, Chest, Abdomen, Front perineal area, Right upper leg, Left upper leg, Face, Right lower leg, Buttocks, Left lower leg, Right arm   Body parts bathed by helper: Right arm     Bathing assist Assist Level: Contact Guard/Touching assist     Upper Body Dressing/Undressing Upper body dressing   What is the patient wearing?: Pull over shirt    Upper body assist Assist Level: Set up assist    Lower Body Dressing/Undressing Lower body  dressing      What is the patient wearing?: Pants     Lower body assist Assist for lower body dressing: Contact Guard/Touching assist     Toileting Toileting Toileting Activity did not occur (Clothing management and hygiene only): N/A (no void or bm)  Toileting assist Assist for toileting: Supervision/Verbal cueing     Transfers Chair/bed transfer  Transfers assist     Chair/bed transfer assist level: Contact Guard/Touching assist Chair/bed transfer assistive device: Programmer, multimedia   Ambulation assist      Assist level: Contact Guard/Touching assist Assistive device: Rollator Max distance: 27ft   Walk 10 feet activity   Assist     Assist level: Contact Guard/Touching assist Assistive device: Rollator   Walk 50 feet activity   Assist Walk 50 feet with 2 turns activity did not occur: Safety/medical concerns  Assist level: Contact Guard/Touching assist Assistive device: Rollator    Walk 150 feet activity   Assist Walk 150 feet activity did not occur: Safety/medical concerns  Assist level: Supervision/Verbal cueing Assistive device: Walker-rolling    Walk 10 feet on uneven surface  activity   Assist     Assist level: Minimal Assistance - Patient > 75% Assistive device: Aeronautical engineer Will patient use wheelchair at discharge?: No             Wheelchair 50 feet with 2 turns activity    Assist  Wheelchair 150 feet activity     Assist          Blood pressure (!) 98/53, pulse 89, temperature 98.4 F (36.9 C), temperature source Oral, resp. rate 16, height 5' 9.02" (1.753 m), weight 100.2 kg, SpO2 100 %.  Medical Problem List and Plan: 1.Left-sided weakness and dysarthriasecondary to 2 small infarcts left cerebellar deep nucleusand posterior to the atrium of the right lateral ventricle most likely separate small vessel disease. Plan 30-day cardiac event monitor  as outpatient New Left ACA infarct, outside prior CVA distribution (Left cerebellar, VB system)   Continue CIR 2. Antithrombotics: -DVT/anticoagulation:Subcutaneous heparin- no lovenox due to HD -antiplatelet therapy: Aspirin and Plavix x3 weeks then Plavix alone-ASA dose increased 3. Pain Management:Tylenol as needed 4. Mood:Provide emotional support -antipsychotic agents: N/A 5. Neuropsych: This patientiscapable of making decisions on herown behalf. 6. Skin/Wound Care:Routine skin checks 7. Fluids/Electrolytes/Nutrition:Routine in and outs 8.  ESRD. Continue hemodialysis as directed by Nephro- resume in hospital HD  9. Hyperlipidemia.  Continue Lipitor 10. Diet controlled diabetes mellitus. Hemoglobin A1c 6.7. Blood sugar checks currently discontinued  Will consider CBGs again if blood glucose remains elevated 11. Permissive hypertension/tachycardia.  Monitor with increased mobility and titrate as required.  ECG personally reviewed, showing sinus tachycardia Vitals:   10/09/19 1928 10/10/19 0450  BP: 129/64 (!) 98/53  Pulse: 100 89  Resp: 16 16  Temp: 98.2 F (36.8 C) 98.4 F (36.9 C)  SpO2: 100% 100%   Blood pressure remains labile on 6/6  Heart rate improving on 6/6  Metoprolol changed to Toprol-XL on 6/6 12. UTI: Completed course of amoxicillin 13.  Anemia of chronic disease  Hemoglobin 12.4 on 6/3,?  Accuracy, labs with HD  LOS: 17 days A FACE TO FACE EVALUATION WAS PERFORMED  Betsy Rosello Lorie Phenix 10/10/2019, 9:00 AM

## 2019-10-10 NOTE — Progress Notes (Signed)
[facility] Progress Note  Subjective: nausea better, eating better  Vitals:   10/10/19 0450 10/10/19 0450 10/10/19 1452 10/10/19 1917  BP:  (!) 98/53 119/68 (!) 112/55  Pulse:  89 89 88  Resp:  16 18 17   Temp:  98.4 F (36.9 C) 98.4 F (36.9 C) 98 F (36.7 C)  TempSrc:  Oral    SpO2:  100% 100% 100%  Weight: 100.2 kg     Height:        Exam: General:alert,NAD Heart:RRR, no murmur Lungs:CTAB Abdomen:bs +,soft, non-tender, ND Extremities:no LE edema Dialysis Access:LUE AVF + bruit  Dialysis Orders: [facility]WF [facility] 4h 400/800 105.5kg 3K/2.25 bath UFP #3 LUE AVF Hep 2000 - Hectoral 59mcg Iv q HD - Mircera 269mcg IV q 2 weeks (last 5/10) - Venofer 100 x 10 ordered --> for Hgb 8.5, tsat 7% on 5/10.  Problem/Plan: 1. Acute L cerebellar + subcentimeter deep white matter R RXY:[id number] L sided weakness(has improved over the week ) and facial droop improving. Echo normal. CTA without carotid or posterior circulation stenoses. Prior CVA on imaging that she was unaware of.Neuro followed, plan is for 30d event monitor to r/o A-fib.///transitioned to CIR. Then had new L ACA infarct , outside prior CVA distribution, w/ symptoms of whole body weakness and nausea/ vomiting around 10/07/19. DC cancelled. Overall these symptoms are now improving.  2. ESRD:Continue HD MWF. HD Monday 3. Hypertension/volume:sp permissiveHTN w/ acute CVA.BP now controlled/low sided. Metoprolol lowered to 12.5mg  bid. Min UF w/ HD. 4-5 kg under 4. Anemia(ESRD + IDA):Hgb10.2> 9.9>9.3>9's. 200 aranespHd 5/26 and 6/2, next due 6/9.  On high dose ESA as outpatient 5. Metabolic bone disease:Corec Ca10-11 range,   [name] 4 not given  Use 2.0 ca bath , resumedhome binder Lorin Picket). 6. T2DM- per admit 7. Hyperlipidemia: Prev on statin - off for unknown time, resumed here. 8.  UTI = Staph .>100,000 rx'd w/ po Amoxcompleted 9. Gastroparesis: Chronic issue     Rob  Davarius Ridener 10/10/2019, 10:54 PM   Recent Labs  Lab 10/04/19 1648 10/04/19 1648 10/06/19 1356 10/06/19 1357 10/07/19 1021 10/07/19 1121  K 2.9*   < >  --  4.2 4.4  --   BUN 11   < >  --  23* 10  --   CREATININE 4.96*   < >  --  9.63* 6.76*  --   CALCIUM 8.3*   < >  --  9.3 10.0  --   PHOS 1.9*  --   --  4.1  --   --   HGB 10.0*   < > 9.3*  --   --  12.4   < > = values in this interval not displayed.   Inpatient medications: . atorvastatin  40 mg Oral Daily  . Chlorhexidine Gluconate Cloth  6 each Topical Q0600  . clopidogrel  75 mg Oral Daily  . darbepoetin (ARANESP) injection - DIALYSIS  200 mcg Intravenous Q Wed-HD  . feeding supplement (NEPRO CARB STEADY)  237 mL Oral TID BM  . ferric citrate  420 mg Oral TID WC  . metoprolol succinate  12.5 mg Oral Daily  . multivitamin  1 tablet Oral QHS  . ondansetron  4 mg Oral Q12H  . polyethylene glycol  17 g Oral Daily  . senna-docusate  1 tablet Oral BID    acetaminophen **OR** acetaminophen, sorbitol

## 2019-10-10 NOTE — Progress Notes (Signed)
Occupational Therapy Session Note  Patient Details  Name: [name] MRN: [id number] Date of Birth: 05-May-1961  Today's Date: 10/10/2019 OT Individual Time: 5830-9407 OT Individual Time Calculation (min): 56 min   Skilled Therapeutic Interventions/Progress Updates:    Pt greeted in the w/c with no c/o pain. [name] that she had the opportunity to go outside during PT session earlier today. Motivated to shower. She completed bathing (sitting on TTB), dressing (sit<stand at sink using rollator as the seat), and grooming tasks (seated on rollator seat) during session. Tx focus was placed on rollator safety and management, ADL retraining, activity tolerance, NMR, and dynamic balance. Pt ambulated with rollator throughout session with supervision, vcs for locking brakes at appropriate times and proper positioning of device during dressing tasks at the sink. Pt very motivated by her functional progress, wants HHOT vs OPOT now, already has necessary bathroom DME ordered for home. No c/o dizziness during tx. Left her with all needs within reach and chair alarm set. Provided her with an antinausea aromatherapy blend per request in case her nausea returns today.   Therapy Documentation Precautions:  Precautions Precautions: Fall Precaution Comments: L hemiparesis/watch BP Restrictions Weight Bearing Restrictions: No ADL: ADL Eating: Not assessed Grooming: Setup Where Assessed-Grooming: Edge of bed Upper Body Bathing: Setup Where Assessed-Upper Body Bathing: Edge of bed Lower Body Bathing: Contact guard Where Assessed-Lower Body Bathing: Edge of bed Upper Body Dressing: Setup Where Assessed-Upper Body Dressing: Edge of bed Lower Body Dressing: Contact guard Where Assessed-Lower Body Dressing: Edge of bed Toileting: Not assessed Toilet Transfer: Therapist, music Method: Ambulating(without AD) Gaffer Transfer: Curator Method: Ambulating(without  AD) Gaffer Equipment: Transfer tub bench      Therapy/Group: Individual Therapy  Kema Santaella A Jameika Kinn 10/10/2019, 12:30 PM

## 2019-10-10 NOTE — Plan of Care (Signed)
°  Problem: Consults Goal: RH STROKE PATIENT EDUCATION Description: See Patient Education module for education specifics  Outcome: Progressing   Problem: RH BOWEL ELIMINATION Goal: RH STG MANAGE BOWEL WITH ASSISTANCE Description: STG Manage Bowel with Assistance. Outcome: Progressing Goal: RH STG MANAGE BOWEL W/MEDICATION W/ASSISTANCE Description: STG Manage Bowel with Medication with Assistance. Outcome: Progressing   Problem: RH SKIN INTEGRITY Goal: RH STG SKIN FREE OF INFECTION/BREAKDOWN Outcome: Progressing   Problem: RH SAFETY Goal: RH STG ADHERE TO SAFETY PRECAUTIONS W/ASSISTANCE/DEVICE Description: STG Adhere to Safety Precautions With Assistance/Device. Outcome: Progressing   Problem: RH PAIN MANAGEMENT Goal: RH STG PAIN MANAGED AT OR BELOW PT'S PAIN GOAL Outcome: Progressing

## 2019-10-10 NOTE — Progress Notes (Signed)
Physical Therapy Session Note  Patient Details  Name: [name] MRN: [id number] Date of Birth: 20-Aug-1960  Today's Date: 10/10/2019 PT Individual Time: 0914-1009 PT Individual Time Calculation (min): 55 min   Short Term Goals: Week 1:  PT Short Term Goal 1 (Week 1): = to LTGs based on ELOS PT Short Term Goal 1 - Progress (Week 1): Met PT Short Term Goal 2 (Week 1): Pt will perform bed to chair transfer with CGA. PT Short Term Goal 2 - Progress (Week 1): Met PT Short Term Goal 3 (Week 1): Pt will ambulate 28' with CGA. PT Short Term Goal 3 - Progress (Week 1): Met PT Short Term Goal 4 (Week 1): Pt will perform 10 steps with CGA and BHRs. PT Short Term Goal 4 - Progress (Week 1): Progressing toward goal  Skilled Therapeutic Interventions/Progress Updates:    Session focused on functional transfers and mobility training outside in controlled and community environment. Education on energy conservation techniques, return to community, HD schedule, and overall d/c planning discussion. Pt performed basic transfers throughout session with overall supervision and good carryover of technique with rollator and use of brakes. Pt was able to don Tedhose independently EOB to prepare for OOB. BP and HR monitored (see details below). Pt able to walk distances in controlled environment of about 160' with CGA progressing to close supervision x 2 reps as fatigued more on final walk requires 2 standing rest breaks self selected. Outside pt able to gait over uneven surfaces including small inclines/declines and performed transfer to public bench. Pt able to demonstrate safe use of rollator throughout and would prefer this device at d/c. Primary PT already discussed with SW. NMR for balance retraining on compliant surface while performing functional reaching tasks with BUE with CGA for balance and then 10 reps each of heel raises and toe raises on compliant surface with UE support. Rest breaks as needed due to  decreased endurance. End of session set up in w/c with all needs in reach.   Therapy Documentation Precautions:  Precautions Precautions: Fall Precaution Comments: L hemiparesis/watch BP Restrictions Weight Bearing Restrictions: No    Vital Signs: 111/70 mmHg HR = 93 bpm HR with activity = 112 bpm  Pain: Denies pain   Therapy/Group: Individual Therapy  Canary Brim [facility] 10/10/2019, 10:10 AM

## 2019-10-11 ENCOUNTER — Inpatient Hospital Stay (HOSPITAL_COMMUNITY): Payer: BC Managed Care – PPO

## 2019-10-11 ENCOUNTER — Inpatient Hospital Stay (HOSPITAL_COMMUNITY): Payer: BC Managed Care – PPO | Admitting: Occupational Therapy

## 2019-10-11 LAB — CBC
HCT: 37.4 % (ref 36.0–46.0)
Hemoglobin: 10.7 g/dL — ABNORMAL LOW (ref 12.0–15.0)
MCH: 23.9 pg — ABNORMAL LOW (ref 26.0–34.0)
MCHC: 28.6 g/dL — ABNORMAL LOW (ref 30.0–36.0)
MCV: 83.5 fL (ref 80.0–100.0)
Platelets: 357 10*3/uL (ref 150–400)
RBC: 4.48 MIL/uL (ref 3.87–5.11)
RDW: 21.9 % — ABNORMAL HIGH (ref 11.5–15.5)
WBC: 8.6 10*3/uL (ref 4.0–10.5)
nRBC: 0 % (ref 0.0–0.2)

## 2019-10-11 LAB — RENAL FUNCTION PANEL
Albumin: 3.6 g/dL (ref 3.5–5.0)
Anion gap: 15 (ref 5–15)
BUN: 48 mg/dL — ABNORMAL HIGH (ref 6–20)
CO2: 25 mmol/L (ref 22–32)
Calcium: 9.5 mg/dL (ref 8.9–10.3)
Chloride: 91 mmol/L — ABNORMAL LOW (ref 98–111)
Creatinine, Ser: 11.84 mg/dL — ABNORMAL HIGH (ref 0.44–1.00)
GFR calc Af Amer: 4 mL/min — ABNORMAL LOW (ref >60)
GFR calc non Af Amer: 3 mL/min — ABNORMAL LOW (ref >60)
Glucose, Bld: 198 mg/dL — ABNORMAL HIGH (ref 70–99)
Phosphorus: 3 mg/dL (ref 2.5–4.6)
Potassium: 4 mmol/L (ref 3.5–5.1)
Sodium: 131 mmol/L — ABNORMAL LOW (ref 135–145)

## 2019-10-11 LAB — HEPATITIS B SURFACE ANTIGEN: Hepatitis B Surface Ag: NONREACTIVE

## 2019-10-11 MED ORDER — HEPARIN SODIUM (PORCINE) 1000 UNIT/ML DIALYSIS: 2000.0000 [IU] | Freq: Once | INTRAMUSCULAR | Status: AC

## 2019-10-11 MED ORDER — HEPARIN SODIUM (PORCINE) 1000 UNIT/ML IJ SOLN
INTRAMUSCULAR | Status: AC
  Administered 2019-10-11: 2000 [IU] via INTRAVENOUS_CENTRAL
  Filled 2019-10-11: qty 2

## 2019-10-11 NOTE — Progress Notes (Addendum)
Orthostatic vitals complete. Pt did not have abdominal binder in room, abdominal binder ordered, has not arrived. Completed orthostatic vitals with ted hose only.

## 2019-10-11 NOTE — Progress Notes (Signed)
Occupational Therapy Session Note  Patient Details  Name: [name] MRN: [id number] Date of Birth: 1961/01/22  Today's Date: 10/11/2019 OT Individual Time: 1100-1159 OT Individual Time Calculation (min): 59 min   Short Term Goals: Week 1:  OT Short Term Goal 1 (Week 1): STGs=LTGs due to ELOS  Skilled Therapeutic Interventions/Progress Updates:    Pt greeted in the w/c with no c/o pain. Agreeable to tx and excited about d/c home tomorrow. She completed bathing (sit<stand from TTB), dressing (w/c level sit<stand using rollator), grooming tasks (w/c level), and simulated toileting tasks (using standard toilet) during session. All functional transfers completed at Mod I level using rollator, pt locking brakes at appropriate times and using device to retrieve her clothing items from dresser. Discussed assisting her dtr with IADL responsibilities at home to further regain functional independence. Pt was also interested in developing hobbies at home to stay busy, particularly curious about knitting. OT brought in knitting supplies and taught pt how to knit to address leisure pursuits and psychosocial wellbeing. Discussed using online videos to reference when she continues knitting at home. Pt appreciative. Placed a Mod I sign on the door, discussed that pt can complete functional mobility in the room without staff assistance as long as she uses the rollator. Pt verbalized understanding. Left her in w/c with all needs within reach and safety belt fastened.   Therapy Documentation Precautions:  Precautions Precautions: Fall Precaution Comments: L hemiparesis/watch BP Restrictions Weight Bearing Restrictions: No Vital Signs: Therapy Vitals Temp: 97.9 F (36.6 C) Pulse Rate: 77 Resp: 17 BP: (!) 105/58 Patient Position (if appropriate): Sitting Oxygen Therapy SpO2: 100 % O2 Device: Room Air ADL: ADL Eating: Independent Grooming: Modified independent Where Assessed-Grooming: Sitting at  sink Upper Body Bathing: Setup Where Assessed-Upper Body Bathing: Shower Lower Body Bathing: Setup Where Assessed-Lower Body Bathing: Shower Upper Body Dressing: Modified independent (Device) Where Assessed-Upper Body Dressing: Wheelchair Lower Body Dressing: Modified independent Where Assessed-Lower Body Dressing: Wheelchair Toileting: Modified independent Where Assessed-Toileting: Glass blower/designer: Diplomatic Services operational officer Method: Engineering geologist) Social research officer, government: Chief Financial Officer Method: Engineering geologist) Youth worker: Radio broadcast assistant      Therapy/Group: Individual Therapy  Jasminemarie Sherrard A Kaire Stary 10/11/2019, 2:22 PM

## 2019-10-11 NOTE — Progress Notes (Signed)
Patient ID: [name], female   DOB: 1960/12/27, 59 y.o.   MRN: [id number]   Patient Rollater ordered

## 2019-10-11 NOTE — Plan of Care (Signed)
  Problem: RH Balance Goal: LTG Patient will maintain dynamic standing with ADLs (OT) Description: LTG:  Patient will maintain dynamic standing balance with assist during activities of daily living (OT)  Outcome: Completed/Met   Problem: Sit to Stand Goal: LTG:  Patient will perform sit to stand in prep for activites of daily living with assistance level (OT) Description: LTG:  Patient will perform sit to stand in prep for activites of daily living with assistance level (OT) Outcome: Completed/Met   Problem: RH Grooming Goal: LTG Patient will perform grooming w/assist,cues/equip (OT) Description: LTG: Patient will perform grooming with assist, with/without cues using equipment (OT) Outcome: Completed/Met   Problem: RH Bathing Goal: LTG Patient will bathe all body parts with assist levels (OT) Description: LTG: Patient will bathe all body parts with assist levels (OT) Outcome: Completed/Met   Problem: RH Dressing Goal: LTG Patient will perform upper body dressing (OT) Description: LTG Patient will perform upper body dressing with assist, with/without cues (OT). Outcome: Completed/Met Goal: LTG Patient will perform lower body dressing w/assist (OT) Description: LTG: Patient will perform lower body dressing with assist, with/without cues in positioning using equipment (OT) Outcome: Completed/Met   Problem: RH Toileting Goal: LTG Patient will perform toileting task (3/3 steps) with assistance level (OT) Description: LTG: Patient will perform toileting task (3/3 steps) with assistance level (OT)  Outcome: Completed/Met   Problem: RH Toilet Transfers Goal: LTG Patient will perform toilet transfers w/assist (OT) Description: LTG: Patient will perform toilet transfers with assist, with/without cues using equipment (OT) Outcome: Completed/Met   Problem: RH Tub/Shower Transfers Goal: LTG Patient will perform tub/shower transfers w/assist (OT) Description: LTG: Patient will perform  tub/shower transfers with assist, with/without cues using equipment (OT) Outcome: Completed/Met

## 2019-10-11 NOTE — Plan of Care (Signed)
  Problem: Consults Goal: RH STROKE PATIENT EDUCATION Description: See Patient Education module for education specifics  Outcome: Progressing Goal: Nutrition Consult-if indicated Outcome: Progressing Goal: Diabetes Guidelines if Diabetic/Glucose > 140 Description: If diabetic or lab glucose is > 140 mg/dl - Initiate Diabetes/Hyperglycemia Guidelines & Document Interventions  Outcome: Progressing   Problem: RH BOWEL ELIMINATION Goal: RH STG MANAGE BOWEL WITH ASSISTANCE Description: STG Manage Bowel with Assistance. Outcome: Progressing Goal: RH STG MANAGE BOWEL W/MEDICATION W/ASSISTANCE Description: STG Manage Bowel with Medication with Assistance. Outcome: Progressing   Problem: RH BLADDER ELIMINATION Goal: RH STG MANAGE BLADDER WITH ASSISTANCE Description: STG Manage Bladder With Assistance Outcome: Progressing Goal: RH STG MANAGE BLADDER WITH MEDICATION WITH ASSISTANCE Description: STG Manage Bladder With Medication With Assistance. Outcome: Progressing Goal: RH STG MANAGE BLADDER WITH EQUIPMENT WITH ASSISTANCE Description: STG Manage Bladder With Equipment With Assistance Outcome: Progressing   Problem: RH SKIN INTEGRITY Goal: RH STG SKIN FREE OF INFECTION/BREAKDOWN Outcome: Progressing Goal: RH STG ABLE TO PERFORM INCISION/WOUND CARE W/ASSISTANCE Description: STG Able To Perform Incision/Wound Care With Assistance. Outcome: Progressing   Problem: RH SAFETY Goal: RH STG ADHERE TO SAFETY PRECAUTIONS W/ASSISTANCE/DEVICE Description: STG Adhere to Safety Precautions With Assistance/Device. Outcome: Progressing   Problem: RH COGNITION-NURSING Goal: RH STG USES MEMORY AIDS/STRATEGIES W/ASSIST TO PROBLEM SOLVE Description: STG Uses Memory Aids/Strategies With Assistance to Problem Solve. Outcome: Progressing   Problem: RH PAIN MANAGEMENT Goal: RH STG PAIN MANAGED AT OR BELOW PT'S PAIN GOAL Outcome: Progressing   Problem: RH KNOWLEDGE DEFICIT Goal: RH STG INCREASE  KNOWLEDGE OF DIABETES Outcome: Progressing Goal: RH STG INCREASE KNOWLEDGE OF HYPERTENSION Outcome: Progressing

## 2019-10-11 NOTE — Progress Notes (Signed)
[place] PHYSICAL MEDICINE & REHABILITATION PROGRESS NOTE   Subjective/Complaints: Walking with PT, Sup using walker  Review of systems: Denies CP, SOB, N/V/D  Objective:   No results found. No results for input(s): WBC, HGB, HCT, PLT in the last 72 hours. No results for input(s): NA, K, CL, CO2, GLUCOSE, BUN, CREATININE, CALCIUM in the last 72 hours.  Intake/Output Summary (Last 24 hours) at 10/11/2019 0849 Last data filed at 10/10/2019 2011 Gross per 24 hour  Intake 702 ml  Output --  Net 702 ml     Physical Exam: Vital Signs Blood pressure 103/63, pulse 84, temperature 98.3 F (36.8 C), temperature source Oral, resp. rate 16, height 5' 9.02" (1.753 m), weight 100.2 kg, SpO2 100 %.  General: No acute distress Mood and affect are appropriate Heart: Regular rate and rhythm no rubs murmurs or extra sounds Lungs: Clear to auscultation, breathing unlabored, no rales or wheezes Abdomen: Positive bowel sounds, soft nontender to palpation, nondistended Extremities: No clubbing, cyanosis, or edema Skin: No evidence of breakdown, no evidence of rash  Motor: RUE/RLE: 4-4+/5 proximal to distal LUE/LLE: 4-4+/5 proximal to distal  Assessment/Plan: 1. 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 also had a left cerebellar stroke Medically ready for d/c in am    Care Tool:  Bathing    Body parts bathed by patient: Left arm, Chest, Abdomen, Front perineal area, Right upper leg, Left upper leg, Face, Right lower leg, Buttocks, Left lower leg, Right arm   Body parts bathed by helper: Right arm     Bathing assist Assist Level: Set up assist     Upper Body Dressing/Undressing Upper body dressing   What is the patient wearing?: Pull over shirt    Upper body assist Assist Level: Set up assist    Lower Body Dressing/Undressing Lower body dressing      What is the patient wearing?: Pants     Lower body assist Assist for  lower body dressing: Supervision/Verbal cueing     Toileting Toileting Toileting Activity did not occur (Clothing management and hygiene only): N/A (no void or bm)  Toileting assist Assist for toileting: Supervision/Verbal cueing     Transfers Chair/bed transfer  Transfers assist     Chair/bed transfer assist level: Contact Guard/Touching assist Chair/bed transfer assistive device: Programmer, multimedia   Ambulation assist      Assist level: Contact Guard/Touching assist Assistive device: Rollator Max distance: 72ft   Walk 10 feet activity   Assist     Assist level: Contact Guard/Touching assist Assistive device: Rollator   Walk 50 feet activity   Assist Walk 50 feet with 2 turns activity did not occur: Safety/medical concerns  Assist level: Contact Guard/Touching assist Assistive device: Rollator    Walk 150 feet activity   Assist Walk 150 feet activity did not occur: Safety/medical concerns  Assist level: Supervision/Verbal cueing Assistive device: Walker-rolling    Walk 10 feet on uneven surface  activity   Assist     Assist level: Minimal Assistance - Patient > 75% Assistive device: Aeronautical engineer Will patient use wheelchair at discharge?: No             Wheelchair 50 feet with 2 turns activity    Assist            Wheelchair 150 feet activity     Assist          Blood pressure 103/63,  pulse 84, temperature 98.3 F (36.8 C), temperature source Oral, resp. rate 16, height 5' 9.02" (1.753 m), weight 100.2 kg, SpO2 100 %.  Medical Problem List and Plan: 1.Left-sided weakness and dysarthriasecondary to 2 small infarcts left cerebellar deep nucleusand posterior to the atrium of the right lateral ventricle most likely separate small vessel disease. Plan 30-day cardiac event monitor as outpatient New Left ACA infarct, outside prior CVA distribution (Left cerebellar, VB system)    Continue CIR-medically ready for d/c in am , back at [facility] level per PT  2. Antithrombotics: -DVT/anticoagulation:Subcutaneous heparin- no lovenox due to HD -antiplatelet therapy: Aspirin and Plavix x3 weeks then Plavix alone-ASA dose increased 3. Pain Management:Tylenol as needed 4. Mood:Provide emotional support -antipsychotic agents: N/A 5. Neuropsych: This patientiscapable of making decisions on herown behalf. 6. Skin/Wound Care:Routine skin checks 7. Fluids/Electrolytes/Nutrition:Routine in and outs 8.  ESRD. Continue hemodialysis as directed by Nephro-arrange for OP HD 9. Hyperlipidemia.  Continue Lipitor 10. Diet controlled diabetes mellitus. Hemoglobin A1c 6.7. Blood sugar checks currently discontinued  Will consider CBGs again if blood glucose remains elevated 11. Permissive hypertension/tachycardia.  Monitor with increased mobility and titrate as required.  ECG personally reviewed, showing sinus tachycardia Vitals:   10/11/19 0456 10/11/19 0545  BP: (!) 94/52 103/63  Pulse: 87 84  Resp: 16   Temp: 98.3 F (36.8 C)   SpO2:  100%   Controlled 6/7 12. UTI: Completed course of amoxicillin 13.  Anemia of chronic disease  Hemoglobin 12.4 on 6/3,?  Accuracy, labs with HD  LOS: 18 days A FACE TO FACE EVALUATION WAS PERFORMED  Charlett Blake 10/11/2019, 8:49 AM

## 2019-10-11 NOTE — Progress Notes (Signed)
Physical Therapy Session Note  Patient Details  Name: [name] MRN: [id number] Date of Birth: 06-24-1960  Today's Date: 10/11/2019 PT Individual Time: 0321-2248 and 1005-1045 PT Individual Time Calculation (min): 70 min and 40 min    Short Term Goals: Week 2:  PT Short Term Goal 1 (Week 2): = to LTGs based on ELOS  Skilled Therapeutic Interventions/Progress Updates:    Session 1: Pt supine in bed upon PT arrival, agreeable to therapy tx and denies pain. Pt transferred to sitting independently and brushed hair, donned teds and donned shoes all without assist. Vitals monitored in sitting - BP 124/63 and HR 99 bpm. RN in/out to provide medications. Pt ambulated from room<>gym this session with rollator and supervision 2 x 200 ft. Pt worked on dynamic standing balance this session without UE support to perform the following tasks - lateral weightshifting on rocker board, balancing rocker board in the center, toe taps on aerobic step and mini squats on airex, all with CGA.Pt worked on LE strengthening exercises this session within the parallel bars, performed 2 x 10 of each - standing hip abduction, hip extension, squats, and calf raises. Pt ambulated to the steps with rollator and supervision, ascended/descended 12 steps with bilateral rails and supervision. Pt ambulated back to her room x 200 ft with rollator and supervsion.   Session 2: Pt seated in w/c upon PT arrival, agreeable to therapy tx and denies pain. Pt ambulated from room>ortho gym x 300 ft with rollator and supervision. Pt performed car transfer this sessio with rollator and supervision. Pt ambulated across the ramp for unlevel surfaces with rollator and supervision x 10 ft. Pt ambulated to the rehab apartment with rollator and supervision, performed recliner transfer Mod I and performed bed transfer mod I with rollator. Performed bed mobility supine<>sitting on rehab apartment bed independently. Pt ambulated x 200 ft to the dayroom with  supervision and used kinetron x 2 bouts in standing for LE strength and balance. Pt ambulated back to room and left in w/c with needs in reach and chair alarm set.    Therapy Documentation Precautions:  Precautions Precautions: Fall Precaution Comments: L hemiparesis/watch BP Restrictions Weight Bearing Restrictions: No   Therapy/Group: Individual Therapy  Netta Corrigan, PT, DPT, CSRS 10/11/2019, 7:47 AM

## 2019-10-11 NOTE — Progress Notes (Signed)
Physical Therapy Discharge Summary  Patient Details  Name: [name] MRN: [id number] Date of Birth: 12-06-1960   Patient has met 10 of 10 long term goals due to improved activity tolerance, improved balance, improved postural control, increased strength and improved awareness.  Patient to discharge at an ambulatory level Supervision.   Patient's care partner attended hands-on education/training and is independent to provide the necessary  supervision  assistance at discharge.  All goals met.   Recommendation:  Patient will benefit from ongoing skilled PT services in home health setting to continue to advance safe functional mobility, address ongoing impairments in balance, strength, endurance, and minimize fall risk.  Equipment: Rollator  Reasons for discharge: treatment goals met  Patient/family agrees with progress made and goals achieved: Yes  PT Discharge Precautions/Restrictions Precautions Precautions: Fall Precaution Comments: L hemiparesis/watch BP Restrictions Weight Bearing Restrictions: No Vital Signs Therapy Vitals Temp: 98.3 F (36.8 C) Temp Source: Oral Pulse Rate: 84 Resp: 16 BP: 103/63 Patient Position (if appropriate): Lying Oxygen Therapy SpO2: 100 % Cognition Overall Cognitive Status: Within Functional Limits for tasks assessed Arousal/Alertness: Awake/alert Orientation Level: Oriented X4 Focused Attention: Appears intact Focused Attention Impairment: Verbal complex Sustained Attention Impairment: Functional complex Problem Solving: Appears intact Safety/Judgment: Appears intact Sensation Sensation Light Touch: Appears Intact Proprioception: Appears Intact Additional Comments: sensation intact B LEs Coordination Gross Motor Movements are Fluid and Coordinated: Yes Fine Motor Movements are Fluid and Coordinated: Yes Coordination and Movement Description: still with mild L LE paresis but movements are fluid and coordinated Motor   Motor Motor: Hemiplegia Motor - Skilled Clinical Observations: mild L LE paresis Motor - Discharge Observations: mild apraxia improved since initial evaluation  Mobility Bed Mobility Bed Mobility: Supine to Sit;Sit to Supine;Rolling Right;Rolling Left Rolling Right: Independent Rolling Left: Independent Supine to Sit: Independent Sit to Supine: Independent Transfers Transfers: Sit to Stand;Stand to Sit;Stand Pivot Transfers Sit to Stand: Independent with assistive device Stand to Sit: Independent with assistive device Stand Pivot Transfers: Independent with assistive device Locomotion  Gait Ambulation: Yes Gait Assistance: Supervision/Verbal cueing Gait Distance (Feet): 200 Feet Assistive device: Rollator Gait Gait: Yes Gait Pattern: Impaired Stairs / Additional Locomotion Stairs Assistance: Supervision/Verbal cueing Stair Management Technique: Two rails Number of Stairs: 12 Height of Stairs: 6 Wheelchair Mobility Wheelchair Mobility: No  Trunk/Postural Assessment  Cervical Assessment Cervical Assessment: Within Functional Limits Thoracic Assessment Thoracic Assessment: Within Functional Limits Lumbar Assessment Lumbar Assessment: Within Functional Limits Postural Control Postural Control: Within Functional Limits  Balance Balance Balance Assessed: Yes Static Sitting Balance Static Sitting - Level of Assistance: 7: Independent Dynamic Sitting Balance Dynamic Sitting - Level of Assistance: 6: Modified independent (Device/Increase time) Static Standing Balance Static Standing - Level of Assistance: 6: Modified independent (Device/Increase time) Dynamic Standing Balance Dynamic Standing - Level of Assistance: 5: Stand by assistance Extremity Assessment  RLE Assessment RLE Assessment: Within Functional Limits LLE Assessment Passive Range of Motion (PROM) Comments: WFL Active Range of Motion (AROM) Comments: WFL LLE Strength Left Hip Flexion: 3+/5 Left Knee  Flexion: 3+/5 Left Knee Extension: 3+/5 Left Ankle Dorsiflexion: 3+/5 Left Ankle Plantar Flexion: 3+/5    Netta Corrigan, PT, DPT 10/11/2019, 8:56 AM  Page Spiro, PT, DPT 10/12/19 6:47 PM

## 2019-10-11 NOTE — Plan of Care (Signed)
  Problem: Consults Goal: RH STROKE PATIENT EDUCATION Description: See Patient Education module for education specifics  Outcome: Progressing Goal: Nutrition Consult-if indicated Outcome: Progressing Goal: Diabetes Guidelines if Diabetic/Glucose > 140 Description: If diabetic or lab glucose is > 140 mg/dl - Initiate Diabetes/Hyperglycemia Guidelines & Document Interventions  Outcome: Progressing   Problem: RH BOWEL ELIMINATION Goal: RH STG MANAGE BOWEL WITH ASSISTANCE Description: STG Manage Bowel with Assistance. Outcome: Progressing   Problem: RH BLADDER ELIMINATION Goal: RH STG MANAGE BLADDER WITH ASSISTANCE Description: STG Manage Bladder With Assistance Outcome: Progressing Goal: RH STG MANAGE BLADDER WITH MEDICATION WITH ASSISTANCE Description: STG Manage Bladder With Medication With Assistance. Outcome: Progressing Goal: RH STG MANAGE BLADDER WITH EQUIPMENT WITH ASSISTANCE Description: STG Manage Bladder With Equipment With Assistance Outcome: Progressing   Problem: RH SKIN INTEGRITY Goal: RH STG SKIN FREE OF INFECTION/BREAKDOWN Outcome: Progressing Goal: RH STG ABLE TO PERFORM INCISION/WOUND CARE W/ASSISTANCE Description: STG Able To Perform Incision/Wound Care With Assistance. Outcome: Progressing   Problem: RH SAFETY Goal: RH STG ADHERE TO SAFETY PRECAUTIONS W/ASSISTANCE/DEVICE Description: STG Adhere to Safety Precautions With Assistance/Device. Outcome: Progressing   Problem: RH COGNITION-NURSING Goal: RH STG USES MEMORY AIDS/STRATEGIES W/ASSIST TO PROBLEM SOLVE Description: STG Uses Memory Aids/Strategies With Assistance to Problem Solve. Outcome: Progressing   Problem: RH PAIN MANAGEMENT Goal: RH STG PAIN MANAGED AT OR BELOW PT'S PAIN GOAL Outcome: Progressing   Problem: RH KNOWLEDGE DEFICIT Goal: RH STG INCREASE KNOWLEDGE OF DIABETES Outcome: Progressing Goal: RH STG INCREASE KNOWLEDGE OF HYPERTENSION Outcome: Progressing

## 2019-10-11 NOTE — Progress Notes (Signed)
Occupational Therapy Discharge Summary  Patient Details  Name: DAYA DUTT MRN: [id number] Date of Birth: 1961-03-17  Patient has met 9 of 9 long term goals due to improved activity tolerance, improved balance, postural control, ability to compensate for deficits, functional use of  LEFT upper extremity, improved awareness and improved coordination.  Patient to discharge at overall Modified Independent level. Patient is discharged at [place] I level and can direct care to caregiver for setup needs. Per pt, she is going home with dtr and grandson who can provide 24/7 supervision and assistance with IADL needs.   All goals met.   Recommendation:  Patient will benefit from ongoing skilled OT services in home health setting to continue to advance functional skills in the area of iADL and Vocation.  Equipment: Already has 3:1 and TTB ordered for home  Reasons for discharge: treatment goals met and discharge from hospital  Patient/family agrees with progress made and goals achieved: Yes  OT Discharge Precautions/Restrictions  Precautions Precautions: Fall Precaution Comments: L hemiparesis/watch BP Restrictions Weight Bearing Restrictions: No Pain Pain Assessment Pain Scale: 0-10 Pain Score: 0-No pain ADL ADL Eating: Independent Grooming: Modified independent Where Assessed-Grooming: Sitting at sink Upper Body Bathing: Setup Where Assessed-Upper Body Bathing: Shower Lower Body Bathing: Setup Where Assessed-Lower Body Bathing: Shower Upper Body Dressing: Modified independent (Device) Where Assessed-Upper Body Dressing: Wheelchair Lower Body Dressing: Modified independent Where Assessed-Lower Body Dressing: Wheelchair Toileting: Modified independent Where Assessed-Toileting: Glass blower/designer: Diplomatic Services operational officer Method: Engineering geologist) Transport planner Method: Sports administrator) Youth worker: Tourist information centre manager Overall Cognitive Status: Within Functional Limits for tasks assessed Arousal/Alertness: Awake/alert Orientation Level: Oriented X4 Focused Attention: Appears intact Focused Attention Impairment: Verbal complex Sustained Attention Impairment: Functional complex Problem Solving: Appears intact Safety/Judgment: Appears intact Balance Balance Balance Assessed: Yes Dynamic Sitting Balance Dynamic Sitting - Balance Support: During functional activity Dynamic Sitting - Level of Assistance: 6: Modified independent (Device/Increase time)(Dressing LB) Dynamic Standing Balance Dynamic Standing - Balance Support: During functional activity;No upper extremity supported Dynamic Standing - Level of Assistance: 6: Modified independent (Device/Increase time) Dynamic Standing - Balance Activities: Lateral lean/weight shifting;Forward lean/weight shifting(LB dressing) Extremity/Trunk Assessment RUE Assessment RUE Assessment: Within Functional Limits LUE Assessment LUE Assessment: Within Functional Limits   Adonia Porada A Sidda Humm 10/11/2019, 12:23 PM

## 2019-10-11 NOTE — Progress Notes (Signed)
[facility] Progress Note   Subjective:   Patient seen and examined in room.  Sitting in wheelchair at bedside.  Feeling well.  Nausea resolved.  No specific complaints. Per pt going home tomorrow.   Objective Vitals:   10/10/19 1452 10/10/19 1917 10/11/19 0456 10/11/19 0545  BP: 119/68 (!) 112/55 (!) 94/52 103/63  Pulse: 89 88 87 84  Resp: 18 17 16    Temp: 98.4 F (36.9 C) 98 F (36.7 C) 98.3 F (36.8 C)   TempSrc:   Oral   SpO2: 100% 100%  100%  Weight:      Height:       Physical Exam General:Well appearing female in NAD, sitting in wheelchair at bedside.  Heart:RRR Lungs:CTAB Abdomen:soft, NTND Extremities:no LE edema, wearing compression stockings Dialysis Access: LU AVF +b   Filed Weights   10/08/19 1317 10/08/19 1735 10/10/19 0450  Weight: 101.5 kg 100.6 kg 100.2 kg    Intake/Output Summary (Last 24 hours) at 10/11/2019 1223 Last data filed at 10/11/2019 1002 Gross per 24 hour  Intake 582 ml  Output --  Net 582 ml    Additional Objective Labs: Basic Metabolic Panel: Recent Labs  Lab 10/04/19 1648 10/06/19 1357 10/07/19 1021  NA 136 134* 135  K 2.9* 4.2 4.4  CL 96* 95* 93*  CO2 27 28 27   GLUCOSE 85 243* 184*  BUN 11 23* 10  CREATININE 4.96* 9.63* 6.76*  CALCIUM 8.3* 9.3 10.0  PHOS 1.9* 4.1  --    Liver Function Tests: Recent Labs  Lab 10/04/19 1648 10/06/19 1357 10/07/19 1021  AST  --   --  33  ALT  --   --  38  ALKPHOS  --   --  85  BILITOT  --   --  0.7  PROT  --   --  8.0  ALBUMIN 3.2* 2.9* 3.7   CBC: Recent Labs  Lab 10/04/19 1648 10/06/19 1356 10/07/19 1121  WBC 7.8 6.7 8.2  NEUTROABS  --   --  7.3  HGB 10.0* 9.3* 12.4  HCT 33.5* 32.5* 42.2  MCV 79.8* 82.9 81.5  PLT 419* 350 410*   Blood Culture    Component Value Date/Time   SDES URINE, CATHETERIZED 09/24/2019 1514   SPECREQUEST  09/24/2019 1514    NONE Performed at [facility], [place] [address], [place], [place] [address]    CULT >=100,000  COLONIES/mL STAPHYLOCOCCUS CAPITIS (A) 09/24/2019 1514   REPTSTATUS 09/27/2019 FINAL 09/24/2019 1514    CBG: Recent Labs  Lab 10/08/19 1130 10/08/19 1803  GLUCAP 207* 151*    Medications:  . atorvastatin  40 mg Oral Daily  . Chlorhexidine Gluconate Cloth  6 each Topical Q0600  . Chlorhexidine Gluconate Cloth  6 each Topical Q0600  . clopidogrel  75 mg Oral Daily  . darbepoetin (ARANESP) injection - DIALYSIS  200 mcg Intravenous Q Wed-HD  . feeding supplement (NEPRO CARB STEADY)  237 mL Oral TID BM  . ferric citrate  420 mg Oral TID WC  . metoprolol succinate  12.5 mg Oral Daily  . multivitamin  1 tablet Oral QHS  . ondansetron  4 mg Oral Q12H  . polyethylene glycol  17 g Oral Daily  . senna-docusate  1 tablet Oral BID    Dialysis Orders: [facility]WF [facility] 4h 400/800 105.5kg 3K/2.25 bath UFP #3 LUE AVF Hep 2000 - Hectoral 76mcg Iv q HD - Mircera 272mcg IV q 2 weeks (last 5/10) - Venofer 100 x  10 ordered --> for Hgb 8.5, tsat 7% on 5/10.  Assessment/Plan: 1. Acute L cerebellar + subcentimeter deep white matter R AVW:[id number] L sided weakness (slowly improving)and facial droop improving. Echo normal. CTA without carotid or posterior circulation stenoses. Prior CVA on imaging that she was unaware of.Neuro followed, plan is for 30d event monitor to r/o A-fib.///transitioned to CIR. Then had new L ACA infarct , outside prior CVA distribution, w/ symptoms of whole body weakness and nausea/ vomiting around 10/07/19. DC cancelled. Overall these symptoms have resolved. Plan for ongoing OT/PT at home.  2. ESRD:Continue HD MWF. HD today per regular schedule.  3. Hypertension/volume:sp permissiveHTN w/ acute CVA.BP now on the low side.  Metoprolol lowered to 12.5mg  bid. Min UF w/ HD. 4-5 kg under dry, will need to be lowered at d/c. 4. Anemia(ESRD + IDA):Hgb^12.4 today.200 aranespHD 5/26 and 6/2, next due 6/9.  On high dose ESA as outpatient, will lower dose on discharge and  hold until next labs are drawn since VZS>82.  5. Metabolic bone disease:CorecCa 10.2 range,  [name] 4 not given Use 2.0 ca bath , resumedhome binder Lorin Picket). 6. T2DM- per admit 7. Hyperlipidemia: Prev on statin - off for unknown time, resumed here. 8.  UTI = Staph .>100,000 rx'd w/ po Amoxcompleted 9. Gastroparesis: Chronic issue   Jen Mow, PA-C [facility] Pager: [phone] 10/11/2019,12:23 PM  LOS: 18 days

## 2019-10-12 ENCOUNTER — Ambulatory Visit: Payer: BC Managed Care – PPO

## 2019-10-12 MED ORDER — METOPROLOL SUCCINATE ER 25 MG PO TB24: 12.5000 mg | ORAL_TABLET | Freq: Every day | ORAL | 0 refills | Status: DC

## 2019-10-12 MED ORDER — ASPIRIN 325 MG PO TABS: 325.0000 mg | ORAL_TABLET | Freq: Every day | ORAL | 3 refills | Status: AC

## 2019-10-12 NOTE — Progress Notes (Signed)
[place] PHYSICAL MEDICINE & REHABILITATION PROGRESS NOTE   Subjective/Complaints: Patient denies any GI upset.  Feels good today.  Had a good day in therapy yesterday.  Daughter is coming in today to pick her up  Review of systems: Denies CP, SOB, N/V/D  Objective:   No results found. Recent Labs    10/11/19 1347  WBC 8.6  HGB 10.7*  HCT 37.4  PLT 357   Recent Labs    10/11/19 1347  NA 131*  K 4.0  CL 91*  CO2 25  GLUCOSE 198*  BUN 48*  CREATININE 11.84*  CALCIUM 9.5    Intake/Output Summary (Last 24 hours) at 10/12/2019 0946 Last data filed at 10/12/2019 0847 Gross per 24 hour  Intake 480 ml  Output 2000 ml  Net -1520 ml     Physical Exam: Vital Signs Blood pressure (!) 93/55, pulse 99, temperature 98.2 F (36.8 C), resp. rate 16, height 5' 9.02" (1.753 m), weight 98 kg, SpO2 100 %.   General: No acute distress Mood and affect are appropriate Heart: Regular rate and rhythm no rubs murmurs or extra sounds Lungs: Clear to auscultation, breathing unlabored, no rales or wheezes Abdomen: Positive bowel sounds, soft nontender to palpation, nondistended Extremities: No clubbing, cyanosis, or edema Skin: No evidence of breakdown, no evidence of rash   Motor: RUE/RLE: 4-4+/5 proximal to distal LUE/LLE: 4-4+/5 proximal to distal  Assessment/Plan: 1. 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 also had a left cerebellar stroke Stable for D/C today F/u PCP in 3-4 weeks F/u PM&R 2 weeks OP HD per Nephro tomorrow  See D/C summary See D/C instructions    Care Tool:  Bathing    Body parts bathed by patient: Left arm, Chest, Abdomen, Front perineal area, Right upper leg, Left upper leg, Face, Right lower leg, Buttocks, Left lower leg, Right arm   Body parts bathed by helper: Right arm     Bathing assist Assist Level: Set up assist     Upper Body Dressing/Undressing Upper body dressing   What is the  patient wearing?: Pull over shirt    Upper body assist Assist Level: Independent with assistive device    Lower Body Dressing/Undressing Lower body dressing      What is the patient wearing?: Pants     Lower body assist Assist for lower body dressing: Independent with assitive device     Toileting Toileting Toileting Activity did not occur (Clothing management and hygiene only): N/A (no void or bm)  Toileting assist Assist for toileting: Independent with assistive device     Transfers Chair/bed transfer  Transfers assist     Chair/bed transfer assist level: Independent with assistive device Chair/bed transfer assistive device: Museum/gallery exhibitions officer assist      Assist level: Supervision/Verbal cueing Assistive device: Rollator Max distance: 300 ft   Walk 10 feet activity   Assist     Assist level: Supervision/Verbal cueing Assistive device: Rollator   Walk 50 feet activity   Assist Walk 50 feet with 2 turns activity did not occur: Safety/medical concerns  Assist level: Supervision/Verbal cueing Assistive device: Rollator    Walk 150 feet activity   Assist Walk 150 feet activity did not occur: Safety/medical concerns  Assist level: Supervision/Verbal cueing Assistive device: Walker-rolling    Walk 10 feet on uneven surface  activity   Assist     Assist level: Supervision/Verbal cueing Assistive device: Event organiser  Assist Will patient use wheelchair at discharge?: No             Wheelchair 50 feet with 2 turns activity    Assist            Wheelchair 150 feet activity     Assist          Blood pressure (!) 93/55, pulse 99, temperature 98.2 F (36.8 C), resp. rate 16, height 5' 9.02" (1.753 m), weight 98 kg, SpO2 100 %.  Medical Problem List and Plan: 1.Left-sided weakness and dysarthriasecondary to 2 small infarcts left cerebellar deep nucleusand posterior to the  atrium of the right lateral ventricle most likely separate small vessel disease. Plan 30-day cardiac event monitor as outpatient New Left ACA infarct, outside prior CVA distribution (Left cerebellar, VB system)   Continue CIR-medically ready for d/c in am , back at SUp level per PT  2. Antithrombotics: -DVT/anticoagulation:Subcutaneous heparin- no lovenox due to HD -antiplatelet therapy: Aspirin and Plavix x3 weeks then Plavix alone-ASA dose increased 3. Pain Management:Tylenol as needed 4. Mood:Provide emotional support -antipsychotic agents: N/A 5. Neuropsych: This patientiscapable of making decisions on herown behalf. 6. Skin/Wound Care:Routine skin checks 7. Fluids/Electrolytes/Nutrition:Routine in and outs 8.  ESRD. Continue hemodialysis as directed by Nephro-arrange for OP HD 9. Hyperlipidemia.  Continue Lipitor 10. Diet controlled diabetes mellitus. Hemoglobin A1c 6.7. Blood sugar checks currently discontinued   11. Permissive hypertension/tachycardia.  Monitor with increased mobility and titrate as required.  ECG personally reviewed, showing sinus tachycardia Vitals:   10/11/19 1947 10/12/19 0458  BP: (!) 121/110 (!) 93/55  Pulse: (!) 104 99  Resp: 18 16  Temp: 98.4 F (36.9 C) 98.2 F (36.8 C)  SpO2: 100% 100%  Labile but within usual range 12. UTI: Completed course of amoxicillin 13.  Anemia of chronic disease  Hemoglobin 12.4 on 6/3,?  Accuracy, labs with HD  LOS: 19 days A FACE TO FACE EVALUATION WAS PERFORMED  Charlett Blake 10/12/2019, 9:46 AM

## 2019-10-12 NOTE — Progress Notes (Signed)
Patient taken to daughters personal vehicle by staff via wheelchair. Patient stated she was excited to go home. Patient received education with verbalizing understanding. Patient took all personal belongings home with her upon discharge.

## 2019-10-12 NOTE — Progress Notes (Signed)
Inpatient Rehabilitation Care Coordinator  Discharge Note  The overall goal for the admission was met for:   Discharge location: Yes, Home  Length of Stay: Yes, 19 Days  Discharge activity level: Yes  Home/community participation: Yes  Services provided included: MD, RD, PT, OT, SLP, RN, CM, TR, Pharmacy and SW  Financial Services: Private Insurance: [place]  Follow-up services arranged: Outpatient: [facility]  Comments (or additional information): PT OT  Patient/Family verbalized understanding of follow-up arrangements: Yes  Individual responsible for coordination of the follow-up plan: [name] [phone]  Confirmed correct DME delivered: Dyanne Iha 10/12/2019    Dyanne Iha

## 2019-10-13 ENCOUNTER — Telehealth: Payer: Self-pay | Admitting: Nurse Practitioner

## 2019-10-13 NOTE — Telephone Encounter (Signed)
Transition of care contact from inpatient facility  Date of discharge: 10/12/2019 Date of contact: 10/13/2019 Method: Phone Spoke to: Patient  Patient contacted to discuss transition of care from recent inpatient hospitalization. Patient was admitted to [facility] from 05/15-05/20/2021 with CVA, In patient rehab 05/20-06/12/2019 with discharge diagnosis of MRI showed a 1 cm acute infarct in the left cerebellum.  Subcentimeter acute infarct in the deep white matter just posterior to the atrium of the right lateral ventricle. Medication changes were reviewed.  Patient will follow up with his/her outpatient HD unit on: 10/13/2019

## 2019-10-14 ENCOUNTER — Telehealth: Payer: Self-pay | Admitting: Registered Nurse

## 2019-10-14 NOTE — Telephone Encounter (Signed)
error 

## 2019-10-14 NOTE — Telephone Encounter (Signed)
Transitional Care call Transitional Questions Answered by her Daughter [name]  Patient name: [name] DOB: 04/09/1961 1. Are you/is patient experiencing any problems since coming home? No a. Are there any questions regarding any aspect of care? No 2. Are there any questions regarding medications administration/dosing? No a. Are meds being taken as prescribed? Yes b. "Patient should review meds with caller to confirm" Medication List Reviewed 3. Have there been any falls? No 4. Has Home Health been to the house and/or have they contacted you? Has Scheduled appointment with [facility] a. If not, have you tried to contact them? NA b. Can we help you contact them? NA 5. Are bowels and bladder emptying properly? Hemodialys Three days a week. Moving Bowels.  a. Are there any unexpected incontinence issues? NA b. If applicable, is patient following bowel/bladder programs? NA 6. Any fevers, problems with breathing, unexpected pain? No 7. Are there any skin problems or new areas of breakdown? No 8. Has the patient/family member arranged specialty MD follow up (ie cardiology/neurology/renal/surgical/etc.)?  [name] was instructed to call [facility] and [place] care she verbalizes understanding.  a. Can we help arrange? NA 9. Does the patient need any other services or support that we can help arrange? No                10. Are caregivers following through as expected in assisting the patient? Yes 11. Has the patient quit smoking, drinking alcohol, or using drugs as recommended? (                        )  [name] requested to see Dr Letta Pate, due to [name] other scheduled appointments and hemodialysis, Appointment was changed to.   Appointment date/time 11/04/2019  arrival time 3:00 for 3:15 appointment with Dr Letta Pate. At [place]

## 2019-10-19 ENCOUNTER — Other Ambulatory Visit: Payer: Self-pay

## 2019-10-19 ENCOUNTER — Ambulatory Visit: Payer: BC Managed Care – PPO

## 2019-10-19 ENCOUNTER — Ambulatory Visit: Payer: BC Managed Care – PPO | Attending: Physician Assistant

## 2019-10-19 DIAGNOSIS — I69354 Hemiplegia and hemiparesis following cerebral infarction affecting left non-dominant side: Secondary | ICD-10-CM | POA: Insufficient documentation

## 2019-10-19 DIAGNOSIS — R41842 Visuospatial deficit: Secondary | ICD-10-CM | POA: Diagnosis present

## 2019-10-19 DIAGNOSIS — Z8673 Personal history of transient ischemic attack (TIA), and cerebral infarction without residual deficits: Secondary | ICD-10-CM

## 2019-10-19 DIAGNOSIS — R278 Other lack of coordination: Secondary | ICD-10-CM | POA: Insufficient documentation

## 2019-10-19 DIAGNOSIS — R2681 Unsteadiness on feet: Secondary | ICD-10-CM | POA: Insufficient documentation

## 2019-10-19 DIAGNOSIS — M6281 Muscle weakness (generalized): Secondary | ICD-10-CM

## 2019-10-19 DIAGNOSIS — R41841 Cognitive communication deficit: Secondary | ICD-10-CM

## 2019-10-19 NOTE — Patient Instructions (Addendum)
.   Your speech/cognitive evaluation came out normal today but I did possibly see some things with your processing. If things don't get back to normal in another 6 weeks we can consider you coming back in for another evaluation and could consider some therapy.  Until then, do "brain stimuliating" tasks instead of watching TV or scrolling on your phone, such as:   Read  Experiment with drawing, writing, painting, or other crafts  Learn a new skill  Take a course  Do word puzzles and/or math problems   Play strategy games (Clue, chess, checkers) or strategy apps like  "Where's My Water", "Parking Jam", or "[facility]"

## 2019-10-19 NOTE — Therapy (Addendum)
[facility] [address] [place], [place], [address] Phone: [phone]   Fax:  [phone]  Physical Therapy Evaluation  Patient Details  Name: [name] MRN: [id number] Date of Birth: Feb 15, 1961 Referring Provider (PT): Jethro Bolus, PA-C   Encounter Date: 10/19/2019   PT End of Session - 10/19/19 1714    Visit Number 1    Number of Visits 16    Date for PT Re-Evaluation 12/14/19    Authorization Type BCBS Medicaid    PT Start Time 3664    PT Stop Time 1620    PT Time Calculation (min) 45 min    Equipment Utilized During Treatment Gait belt    Activity Tolerance Patient tolerated treatment well    Behavior During Therapy [facility] for tasks assessed/performed           Past Medical History:  Diagnosis Date  . Accelerated hypertension   . Aortic atherosclerosis ([place])   . Arthritis of knee    bilateral  . Diabetes mellitus    type 2 - no meds  . Diabetic gastroparesis ([place])   . Diverticulosis   . ESRD (end stage renal disease) ([place])   . Fibroid uterus   . GERD (gastroesophageal reflux disease)   . Hyperlipidemia   . Hypertension   . HYPERTENSION, BENIGN SYSTEMIC 07/03/2006        . Hypertensive emergency 02/05/2019  . Hypokalemia   . IDA (iron deficiency anemia)   . Nausea   . Renal disorder   . Umbilical hernia   . Wears dentures     Past Surgical History:  Procedure Laterality Date  . AV FISTULA PLACEMENT Left 02/17/2019   Procedure: ARTERIOVENOUS (AV) FISTULA CREATION LEFT UPPER ARM;  Surgeon: Waynetta Sandy, MD;  Location: [facility];  Service: Vascular;  Laterality: Left;  . CATARACT EXTRACTION     right eye  . CESAREAN SECTION     x2  . CHOLECYSTECTOMY     laparoscopic  . FISTULA SUPERFICIALIZATION Left 04/06/2019   Procedure: FISTULA SUPERFICIALIZATION LEFT BRACHIOCEPHALIC;  Surgeon: Waynetta Sandy, MD;  Location: [facility];  Service: Vascular;  Laterality: Left;   Transposition left arm brachiocephalic fistula.  . IR FLUORO GUIDE CV LINE RIGHT  02/09/2019  . IR FLUORO GUIDE CV LINE RIGHT  03/05/2019  . IR US GUIDE VASC ACCESS RIGHT  02/09/2019  . MULTIPLE TOOTH EXTRACTIONS    . REDUCTION MAMMAPLASTY Bilateral     There were no vitals filed for this visit.    Subjective Assessment - 10/19/19 1636    Subjective Pt reports on 09/18/19, she was driving to her sister's house and felt very dizzy in her left eye. She made it ot her sister's house but couldn't put car in park but instead she had in neutral. She was feeling that her speech was slurred. Her daughter was called and she was taken to ED and stroke protocol was called. She was in hospital from 09/18/19 to 09/23/19 and in inpatient rehab from 5/20 to 6/8. Patient does dialysis 3 days a week, Mond, Wed, Friday for 4 hours. She reports increased fatigue that lasts rest of the day until next morning. She lives in apartment 2nd floor and has to climb up 15 steps to her apartment. She lives with 63 yr old son with family nearby for assist if needed. Since she came home, she has been able to perform laundry, wash dishes, cook small meal for her self. She is able to  perform self care and bathing inpendently. She reports difficulty with prolonged standing, walking, stairs.    Pertinent History diabetes mellitus, hypertension, end-stage renal disease with hemodialysis    Limitations Lifting;Walking;Standing;House hold activities    How long can you sit comfortably? no issues    How long can you stand comfortably? 5 min    How long can you walk comfortably? 5-10 min    Patient Stated Goals get back to normal    Currently in Pain? No/denies              [facility] PT Assessment - 10/19/19 1615      Assessment   Medical Diagnosis CVA 09/18/19    Referring Provider (PT) Jethro Bolus, PA-C    Onset Date/Surgical Date 09/19/19    Hand Dominance Right    Prior Therapy inpatient rehab      Precautions    Precautions Fall   Fistula in L UE     Restrictions   Weight Bearing Restrictions No      Balance Screen   Has the patient fallen in the past 6 months No    Has the patient had a decrease in activity level because of a fear of falling?  No    Is the patient reluctant to leave their home because of a fear of falling?  No      Home Social worker Private residence    Living Arrangements Children    Available Help at Discharge Family    Type of [place] to enter    Entrance Stairs-Number of Steps 15    Entrance Stairs-Rails Can reach both    [place] One level    [place] - 2 wheels      Cognition   Overall Cognitive Status Within Functional Limits for tasks assessed    Attention Focused    Focused Attention Appears intact    Memory Appears intact    Awareness Appears intact    Problem Solving Appears intact      Transfers   Transfers Sit to Stand;Stand to Sit    Sit to Stand 6: Modified independent (Device/Increase time)    Five time sit to stand comments  unable to perform sit to stand without UE support    Stand to Sit 6: Modified independent (Device/Increase time)      Ambulation/Gait   Ambulation/Gait Yes    Ambulation/Gait Assistance 6: Modified independent (Device/Increase time);5: Supervision    Ambulation Distance (Feet) 770 Feet    Assistive device Rolling walker    Gait Pattern Trunk flexed;Wide base of support   decreased cadence   Ambulation Surface Level;Indoor      6 Minute Walk- Baseline   6 Minute Walk- Baseline --      6 minute walk test results    Aerobic Endurance Distance Walked 770   with rolling walker     Standardized Balance Assessment   Standardized Balance Assessment Berg Balance Test      Berg Balance Test   Sit to Stand Able to stand  independently using hands    Standing Unsupported Able to stand safely 2 minutes    Sitting with Back Unsupported but Feet Supported on Floor  or Stool Able to sit safely and securely 2 minutes    Stand to Sit Sits safely with minimal use of hands    Transfers Able to transfer safely, minor use of hands    Standing Unsupported  with Eyes Closed Able to stand 10 seconds safely    Standing Unsupported with Feet Together Able to place feet together independently and stand 1 minute safely    From Standing, Reach Forward with Outstretched Arm Can reach confidently >25 cm (10")    From Standing Position, Pick up Object from Floor Able to pick up shoe, needs supervision    From Standing Position, Turn to Look Behind Over each Shoulder Looks behind from both sides and weight shifts well    Turn 360 Degrees Able to turn 360 degrees safely but slowly    Standing Unsupported, Alternately Place Feet on Step/Stool Able to stand independently and safely and complete 8 steps in 20 seconds    Standing Unsupported, One Foot in [place] to plae foot ahead of the other independently and hold 30 seconds    Standing on One Leg Able to lift leg independently and hold 5-10 seconds    Total Score 50                      Objective measurements completed on examination: See above findings.               PT Education - 10/19/19 1649    Education Details Pt educated on gradual walking program. Wlaking 5 min and then adding 5 min every week until she can walk for 30 min as her long term goal. Pt can use walker and have family member with her. Walk when it is cooler.    Person(s) Educated Patient;Child(ren)    Methods Explanation    Comprehension Verbalized understanding            PT Short Term Goals - 10/19/19 1649      PT SHORT TERM GOAL #1   Title Patient will be able to go up and down 15 steps with use of one rail and with step to pattern to improve ability to access her aparitment.    Baseline bil hand rail, requires 3 rest breaks to climb up to 15 steps    Time 4    Period Weeks    Status New    Target Date 11/16/19        PT SHORT TERM GOAL #2   Title Pt will be able to perform  sit to stand and stand to sit with proper eccentric control without use of UE to improve functional strength    Baseline Pt unable to perform sit to stand without UE support    Time 4    Period Weeks    Status New    Target Date 11/16/19      PT SHORT TERM GOAL #3   Title Patient will be able to ambulate without a cane for 300 feet to improve in home ambulation.    Baseline pt reports she walks at home 15 feet without AD with furniture/wall surfing    Time 4    Period Weeks    Status New    Target Date 11/16/19             PT Long Term Goals - 10/19/19 1707      PT LONG TERM GOAL #1   Title Pt will score 56/56 on BBS to improve balance and reduce fall risk    Baseline 50/56    Time 8    Period Weeks    Status New    Target Date 12/14/19      PT LONG TERM GOAL #2  Title Pt will be able to ambulate 1000 feet SBA without AD to improve walking    Baseline 30 feet without AD with furniture surfing    Time 8    Period Weeks    Status New    Target Date 12/14/19      PT LONG TERM GOAL #3   Title Pt will be able to go up and down steps with use of one rail and be able to carry 10-15 lbs groceries in her hand to improve access to her apartment    Baseline must use bil rails, requires 3 breaks    Time 8    Period Weeks    Status New    Target Date 12/14/19                 10/19/19 1709  Plan  Clinical Impression Statement Patient is a 59 y.o female who was seen today for physical therapy evaluation and treatment after CVA. Objective impairments include decreased functional strength, decreased transfers, decreased walking endurance, and mild risk for falling (Berg balance scale). These impairments are limiting patient from community ambulation, getting in and out of her apartment, driving, ADLs and independence. Patient will benefit from skilled PT  Personal Factors and Comorbidities Comorbidity 3+;Time  since onset of injury/illness/exacerbation  Comorbidities diabetes mellitus, hypertension, end-stage renal disease with hemodialysis  Examination-Activity Limitations Caring for Others;Carry;Lift;Squat;Stairs;Stand;Transfers  Examination-Participation Restrictions Church;Cleaning;Community Activity;Driving;Laundry;Meal Prep  Pt will benefit from skilled therapeutic intervention in order to improve on the following deficits Abnormal gait;Decreased activity tolerance;Decreased balance;Decreased endurance;Decreased mobility;Decreased strength;Difficulty walking;Decreased safety awareness;Postural dysfunction  Stability/Clinical Decision Making Evolving/Moderate complexity  Clinical Decision Making Moderate  Rehab Potential Good  PT Frequency Other (comment) (1x week for first month, then 1-2x/week for 8 weeks.)  PT Duration 8 weeks  PT Treatment/Interventions ADLs/Self Care Home Management;Gait training;Stair training;Therapeutic activities;Therapeutic exercise;Balance training;Neuromuscular re-education;Patient/family education;Manual techniques;Passive range of motion;Joint Manipulations  PT Next Visit Plan Issue HEP consisting of standing exercises and balance, work on sit to stand (nose over toes), work on IT trainer  PT Home Exercise Plan walking program (start with 5 min and progress it to 30 min with LRAD)  Consulted and Agree with Plan of Care Patient;Family member/caregiver  Family Member Consulted Daughter Merchandiser, retail)    Patient will benefit from skilled therapeutic intervention in order to improve the following deficits and impairments:  Abnormal gait, Decreased activity tolerance, Decreased balance, Decreased endurance, Decreased mobility, Decreased strength, Difficulty walking, Decreased safety awareness, Postural dysfunction  Visit Diagnosis: Cerebellar cerebrovascular accident (CVA) without late effect  Muscle weakness (generalized)  Unsteadiness on feet     Problem  List Patient Active Problem List   Diagnosis Date Noted  . Labile blood pressure   . Sinus tachycardia   . ESRD on dialysis ([place])   . 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[place])   . Leukocytosis   . Left-sided weakness   . Acute ischemic stroke ([place]) 09/19/2019  . Stroke ([place]) 09/18/2019  . Intractable nausea and vomiting 04/20/2019  . Atypical chest pain 04/02/2019  . Type 2 diabetes mellitus without complications ([place]) 53/29/9242  . Anemia in chronic kidney disease 02/19/2019  . ESRD (end stage renal disease) ([place]) 02/19/2019  . Secondary hyperparathyroidism of renal origin ([place]) 02/19/2019  . Coagulation defect, unspecified ([place]) 02/19/2019  . Venous (peripheral) insufficiency 08/06/2017  . Encounter for screening for respiratory tuberculosis 01/01/2016  . Hypertension   . 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 Diabetic gastroparesis ([place]) 08/09/2015  . Low back pain 06/09/2014  . Nausea and vomiting 01/18/2012  . Diarrhea, unspecified 01/18/2012  . Obstructive sleep apnea of adult 01/16/2012  . GERD (gastroesophageal reflux disease) 01/16/2012  . Osteoarthritis of both knees 01/16/2012  . Hyperlipidemia, unspecified 07/18/2011  . LEIOMYOMA, UTERUS 01/14/2007  . Morbid obesity ([place]) 07/03/2006  . Former smoker 07/03/2006  . Tension headache 07/03/2006    Kerrie Pleasure, PT 10/19/2019, 5:17 PM  [facility] [address] [place], [place], [address] Phone: [phone]   Fax:  [phone]  Name: [name] MRN: [id number] Date of Birth: 12-03-1960

## 2019-10-19 NOTE — Therapy (Signed)
[facility] [address] [place], [place], [address] Phone: [phone]   Fax:  [phone]  Speech Language Pathology Evaluation  Patient Details  Name: [name] MRN: [id number] Date of Birth: [birth year] Referring Provider (SLP): Allena Earing, [place]   Encounter Date: 10/19/2019   End of Session - 10/19/19 1631    Visit Number 1    Number of Visits 1    Date for SLP Re-Evaluation 10/19/19    SLP Start Time 1950    SLP Stop Time  1531    SLP Time Calculation (min) 40 min    Activity Tolerance Patient tolerated treatment well           Past Medical History:  Diagnosis Date  . Accelerated hypertension   . Aortic atherosclerosis ([place])   . Arthritis of knee    bilateral  . Diabetes mellitus    type 2 - no meds  . Diabetic gastroparesis ([place])   . Diverticulosis   . ESRD (end stage renal disease) ([place])   . Fibroid uterus   . GERD (gastroesophageal reflux disease)   . Hyperlipidemia   . Hypertension   . HYPERTENSION, BENIGN SYSTEMIC 07/03/2006        . Hypertensive emergency 02/05/2019  . Hypokalemia   . IDA (iron deficiency anemia)   . Nausea   . Renal disorder   . Umbilical hernia   . Wears dentures     Past Surgical History:  Procedure Laterality Date  . AV FISTULA PLACEMENT Left 02/17/2019   Procedure: ARTERIOVENOUS (AV) FISTULA CREATION LEFT UPPER ARM;  Surgeon: Waynetta Sandy, MD;  Location: [facility];  Service: Vascular;  Laterality: Left;  . CATARACT EXTRACTION     right eye  . CESAREAN SECTION     x2  . CHOLECYSTECTOMY     laparoscopic  . FISTULA SUPERFICIALIZATION Left 04/06/2019   Procedure: FISTULA SUPERFICIALIZATION LEFT BRACHIOCEPHALIC;  Surgeon: Waynetta Sandy, MD;  Location: [facility];  Service: Vascular;  Laterality: Left;  Transposition left arm brachiocephalic fistula.  . IR FLUORO GUIDE CV LINE RIGHT  02/09/2019  . IR FLUORO GUIDE CV LINE RIGHT  03/05/2019  . IR US  GUIDE VASC ACCESS RIGHT  02/09/2019  . MULTIPLE TOOTH EXTRACTIONS    . REDUCTION MAMMAPLASTY Bilateral     There were no vitals filed for this visit.       SLP Evaluation [facility] - 10/19/19 1623      SLP Visit Information   SLP Received On 10/19/19    Referring Provider (SLP) Allena Earing, [place]    Onset Date 09-18-19    Medical Diagnosis CVA      Subjective   Patient/Family Stated Goal "Get back to work"      General Information   HPI Pt admitted with lt sided weakness and dysarthria. Brain imaging found 2 small infarcts: lt cerebellum, and in deep white matter posterior to the atrium of rt lateral ventricle. Pt transferred to CIR 09-23-19 and pt was d/c'd from ST approx 6 days prior to pt's d/c from CIR, withOUT recommendation for follow up ST.       Prior Functional Status   Cognitive/Linguistic Baseline Within functional limits    Type of Home Apartment     Lives With Son   with daughter close by   Available Support Family    Vocation Full time employment   housekeeping for [employer]     Cognition   Overall Cognitive Status Within Functional  Limits for tasks assessed   standardized assessment for cog-linguistics was WNL   Behaviors --   evidence of slightly slower processing?     Auditory Comprehension   Overall Auditory Comprehension Appears within functional limits for tasks assessed      Verbal Expression   Overall Verbal Expression Appears within functional limits for tasks assessed      Oral Motor/Sensory Function   Overall Oral Motor/Sensory Function Appears within functional limits for tasks assessed      Motor Speech   Overall Motor Speech Appears within functional limits for tasks assessed          Today, pt's memory, reasoning, problem solving, and attention appeared WNL during the evaluation. SLP, in addition to performing a standardized assessment created to ID problem areas in cognition, asked pt specific questions in order to assess and better understand  pt's cognitive communication skills. Pt demonstrated good insight and reasoning as well as anticipatory awareness (decided to go yesterday some of the day without her walker and today knew she had to use it due to her fatigue yesterday at the end of the day).                 SLP Education - 10/19/19 1630    Education Details brain stimulating tasks, can revisit ST in ~6 weeks if pt does not feel/think processing is WNL    Person(s) Educated Patient;Child(ren)    Methods Explanation;Handout    Comprehension Verbalized understanding                Plan - 10/19/19 1631    Clinical Impression Statement Pt scored within normal limits on a standardized assessment for cognitive linguistics. Dtr and pt only c/o mild/minor processing delays occurring approx x2/day. SLP provided pt with some ideas of tasks to stimulate her brain and/or improve processing; SLP discouraged TV watching and scrolling on her phone. SLP from CIR did not recommend follow up ST. If pt's processing speed cont to appear sub-normal in 4-6 weeks recommend follow up ST eval for consideration of ST at that time.    Speech Therapy Frequency One time visit    Consulted and Agree with Plan of Care Patient           Patient will benefit from skilled therapeutic intervention in order to improve the following deficits and impairments:   Cognitive communication deficit    Problem List Patient Active Problem List   Diagnosis Date Noted  . Labile blood pressure   . Sinus tachycardia   . ESRD on dialysis ([place])   . 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 Leukocytosis   . Left-sided weakness   . Acute ischemic stroke ([place]) 09/19/2019  . Stroke ([place]) 09/18/2019  . Intractable nausea and vomiting 04/20/2019  . Atypical chest pain 04/02/2019  . Type 2 diabetes mellitus without complications ([place]) 56/31/4970  . Anemia  in chronic kidney disease 02/19/2019  . ESRD (end stage renal disease) ([place]) 02/19/2019  . Secondary hyperparathyroidism of renal origin ([place]) 02/19/2019  . Coagulation defect, unspecified ([place]) 02/19/2019  . Venous (peripheral) insufficiency 08/06/2017  . Encounter for screening for respiratory tuberculosis 01/01/2016  . Hypertension   . 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 Diabetic gastroparesis ([place]) 08/09/2015  . Low back pain 06/09/2014  . Nausea and vomiting 01/18/2012  . Diarrhea, unspecified 01/18/2012  . Obstructive sleep apnea of adult 01/16/2012  .  GERD (gastroesophageal reflux disease) 01/16/2012  . Osteoarthritis of both knees 01/16/2012  . Hyperlipidemia, unspecified 07/18/2011  . LEIOMYOMA, UTERUS 01/14/2007  . Morbid obesity ([place]) 07/03/2006  . Former smoker 07/03/2006  . Tension headache 07/03/2006    [facility] ,[place], CCC-SLP 10/19/2019, 4:37 PM  [facility] [address] [place], [place], [address] Phone: [phone]   Fax:  [phone]  Name: [name] MRN: [id number] Date of Birth: Mar 19, 1961

## 2019-10-21 ENCOUNTER — Encounter: Payer: Self-pay | Admitting: Occupational Therapy

## 2019-10-21 ENCOUNTER — Encounter: Payer: BC Managed Care – PPO | Admitting: Physical Medicine & Rehabilitation

## 2019-10-21 ENCOUNTER — Other Ambulatory Visit: Payer: Self-pay

## 2019-10-21 ENCOUNTER — Ambulatory Visit: Payer: BC Managed Care – PPO | Admitting: Occupational Therapy

## 2019-10-21 DIAGNOSIS — Z8673 Personal history of transient ischemic attack (TIA), and cerebral infarction without residual deficits: Secondary | ICD-10-CM | POA: Diagnosis not present

## 2019-10-21 DIAGNOSIS — R2681 Unsteadiness on feet: Secondary | ICD-10-CM

## 2019-10-21 DIAGNOSIS — I69354 Hemiplegia and hemiparesis following cerebral infarction affecting left non-dominant side: Secondary | ICD-10-CM

## 2019-10-21 DIAGNOSIS — R41842 Visuospatial deficit: Secondary | ICD-10-CM

## 2019-10-21 DIAGNOSIS — R278 Other lack of coordination: Secondary | ICD-10-CM

## 2019-10-21 DIAGNOSIS — M6281 Muscle weakness (generalized): Secondary | ICD-10-CM

## 2019-10-21 NOTE — Addendum Note (Signed)
Addended by: Kerrie Pleasure on: 10/21/2019 08:21 AM   Modules accepted: Orders

## 2019-10-21 NOTE — Therapy (Signed)
[facility] [address] [place], [place], [address] Phone: [phone]   Fax:  [phone]  Occupational Therapy Evaluation  Patient Details  Name: [name] MRN: [id number] Date of Birth: 14-Sep-1960 Referring Provider (OT): Mar[place]e [place]   Encounter Date: 10/21/2019   OT End of Session - 10/21/19 1906    Visit Number 1    Number of Visits 9    Date for OT Re-Evaluation 12/05/19    Authorization Type State BCBS/Medicaid - [employer]    OT Start Time 1700    OT Stop Time 1745    OT Time Calculation (min) 45 min    Behavior During Therapy [facility] for tasks assessed/performed           Past Medical History:  Diagnosis Date  . Accelerated hypertension   . Aortic atherosclerosis ([place])   . Arthritis of knee    bilateral  . Diabetes mellitus    type 2 - no meds  . Diabetic gastroparesis ([place])   . Diverticulosis   . ESRD (end stage renal disease) ([place])   . Fibroid uterus   . GERD (gastroesophageal reflux disease)   . Hyperlipidemia   . Hypertension   . HYPERTENSION, BENIGN SYSTEMIC 07/03/2006        . Hypertensive emergency 02/05/2019  . Hypokalemia   . IDA (iron deficiency anemia)   . Nausea   . Renal disorder   . Umbilical hernia   . Wears dentures     Past Surgical History:  Procedure Laterality Date  . AV FISTULA PLACEMENT Left 02/17/2019   Procedure: ARTERIOVENOUS (AV) FISTULA CREATION LEFT UPPER ARM;  Surgeon: Waynetta Sandy, MD;  Location: [facility];  Service: Vascular;  Laterality: Left;  . CATARACT EXTRACTION     right eye  . CESAREAN SECTION     x2  . CHOLECYSTECTOMY     laparoscopic  . FISTULA SUPERFICIALIZATION Left 04/06/2019   Procedure: FISTULA SUPERFICIALIZATION LEFT BRACHIOCEPHALIC;  Surgeon: Waynetta Sandy, MD;  Location: [facility];  Service: Vascular;  Laterality: Left;  Transposition left arm brachiocephalic fistula.  . IR FLUORO GUIDE CV LINE RIGHT  02/09/2019  . IR FLUORO  GUIDE CV LINE RIGHT  03/05/2019  . IR US GUIDE VASC ACCESS RIGHT  02/09/2019  . MULTIPLE TOOTH EXTRACTIONS    . REDUCTION MAMMAPLASTY Bilateral     There were no vitals filed for this visit.   Subjective Assessment - 10/21/19 1713    Subjective  I have to pace myself, I do things slower now than I used to    Patient is accompanied by: Family member   [place]   Pertinent History HTN, DM, HD    Currently in Pain? No/denies    Pain Score 0-No pain             [facility] OT Assessment - 10/21/19 0001      Assessment   Medical Diagnosis cerebellar stroke    Referring Provider (OT) Linna Hoff Angiulli    Onset Date/Surgical Date 09/19/19    Hand Dominance Right    Prior Therapy CIR      ADL   Eating/Feeding Independent    Grooming Modified independent   Seated   Upper Body Bathing Modified independent    Lower Body Bathing Modified independent    Upper Body Dressing Increased time    Lower Body Dressing Modified independent    Toilet Transfer Modified independent    Toileting - Clothing Manipulation Modified independent  Toileting -  Electronics engineer Modified independent      IADL   Prior Level of Function Shopping Independent    Shopping Completely unable to shop    Prior Level of Function Light Housekeeping Independent    Light Housekeeping Performs light daily tasks such as dishwashing, bed making    Prior Level of Function Meal Prep Independent    Meal Prep Prepares adequate meal if supplied with ingredients    Prior Level of Function Scientist, research (physical sciences) Relies on family or friends for transportation    Prior Level of Function Medication Managment Independent    Medication Management Is responsible for taking medication in correct dosages at correct time    Prior Level of Function Therapist, sports financial matters independently (budgets, writes checks, pays rent,  bills goes to bank), collects and keeps track of income      Written Expression   Dominant Hand Right      Vision - History   Visual History Cataracts    Additional Comments R cataract removed, left to be removed at some point      [place] Alignment Within Functional Limits    Ocular Range of Motion Within Functional Limits    Alignment/Gaze Preference Within Defined Limits    Tracking/Visual Pursuits Decreased smoothness of horizontal tracking    Saccades Additional eye shifts occurred during testing      Activity Tolerance   Activity Tolerance --   Fatigue with HD     Cognition   Overall Cognitive Status Within Functional Limits for tasks assessed    Cognition Comments Slight delay in processing      Posture/Postural Control   Posture/Postural Control No significant limitations      Sensation   Light Touch Appears Intact    Stereognosis Appears Intact    Hot/Cold Appears Intact    Proprioception Appears Intact      Coordination   Gross Motor Movements are Fluid and Coordinated Yes    Fine Motor Movements are Fluid and Coordinated Yes    9 Hole Peg Test Right;Left    Right 9 Hole Peg Test 27.22    Left 9 Hole Peg Test 28.68      ROM / Strength   AROM / PROM / Strength AROM;Strength      AROM   Overall AROM  Deficits    AROM Assessment Site Shoulder;Elbow    Right/Left Shoulder Left    Left Shoulder Flexion 95 Degrees    Left Shoulder ABduction 75 Degrees    Left Elbow Extension -10      Strength   Overall Strength Deficits    Strength Assessment Site Shoulder    Right/Left Shoulder Left    Left Shoulder Flexion 4-/5      Hand Function   Right Hand Gross Grasp Functional    Right Hand Grip (lbs) 66.3    Right Hand Lateral Pinch 17 lbs    Left Hand Gross Grasp Impaired    Left Hand Grip (lbs) 44.3    Left Hand Lateral Pinch 13 lbs                           OT Education - 10/21/19 1905    Education Details results of OT  Assessment, plan of care    Person(s) Educated Patient;Child(ren)    Methods  Explanation    Comprehension Verbalized understanding               OT Long Term Goals - 10/21/19 1912      OT LONG TERM GOAL #1   Title Patient will complete an HEP designed to improve range of motion in left shoulder and elbow and grip strength in LUE    Baseline No current exercise program    Time 4    Period Weeks    Status New    Target Date 12/05/19      OT LONG TERM GOAL #2   Title Patient will understanding recommendations relating to returning to driving    Baseline Patient not currently driving, with slowed reaction time, and dizziness with head position changes    Time 4    Period Weeks    Status New      OT LONG TERM GOAL #3   Title Patient will demonstrate 5 lb increase in left grip strength to help with opening  food packages or bottles    Baseline 44lb    Time 4    Period Weeks    Status New      OT LONG TERM GOAL #4   Title Patient will report reduced dizziness with head position changes in the shower, or during bathing and dressing tasks.    Baseline Avoids all head movement now to avoid dizziness    Time 4    Period Weeks    Status New                 Plan - 10/21/19 1907    Clinical Impression Statement Patient is a 59 year old female with history of HTN, DM, on Hemodialysis now with cerebellar strokes, recently home from Inpatient rehab.  Patient presents to OT eval with motion sensitivity, decreased fluid eye movement, decreased gaze stability, dizziness, decreased balance, decreased range of motion and strength throughout LUE.  patient will benefit from skilled OT intervention to improve her ability, speed, and safety with ADL/IADL and hoefully return to driving.    OT Occupational Profile and History Detailed Assessment- Review of Records and additional review of physical, cognitive, psychosocial history related to current functional performance    Occupational  performance deficits (Please refer to evaluation for details): ADL's;IADL's    Body Structure / Function / Physical Skills ADL;Coordination;Endurance;GMC;UE functional use;Balance;Body mechanics;Flexibility;IADL;Pain;Strength;FMC;Dexterity;Mobility;ROM    Rehab Potential Good    Clinical Decision Making Several treatment options, min-mod task modification necessary    Comorbidities Affecting Occupational Performance: May have comorbidities impacting occupational performance    Modification or Assistance to Complete Evaluation  Min-Moderate modification of tasks or assist with assess necessary to complete eval    OT Frequency 2x / week    OT Duration 4 weeks    OT Treatment/Interventions Self-care/ADL training;Therapeutic exercise;Visual/perceptual remediation/compensation;Patient/family education;Neuromuscular education;Moist Heat;Functional Furniture conservator/restorer;Therapeutic activities;Balance training;DME and/or AE instruction;Manual Therapy;Passive range of motion    Plan Address left shoulder range of motion and initiate HEP    Consulted and Agree with Plan of Care Patient;Family member/caregiver    Family Member Consulted Daughter           Patient will benefit from skilled therapeutic intervention in order to improve the following deficits and impairments:   Body Structure / Function / Physical Skills: ADL, Coordination, Endurance, GMC, UE functional use, Balance, Body mechanics, Flexibility, IADL, Pain, Strength, FMC, Dexterity, Mobility, ROM       Visit Diagnosis: Unsteadiness on feet - Plan: Ot plan  of care cert/re-cert  Hemiplegia and hemiparesis following cerebral infarction affecting left non-dominant side ([place]) - Plan: Ot plan of care cert/re-cert  Muscle weakness (generalized) - Plan: Ot plan of care cert/re-cert  Visuospatial deficit - Plan: Ot plan of care cert/re-cert  Other lack of coordination - Plan: Ot plan of care cert/re-cert    Problem List Patient Active  Problem List   Diagnosis Date Noted  . Labile blood pressure   . Sinus tachycardia   . ESRD on dialysis ([place])   . 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 Leukocytosis   . Left-sided weakness   . Acute ischemic stroke ([place]) 09/19/2019  . Stroke ([place]) 09/18/2019  . Intractable nausea and vomiting 04/20/2019  . Atypical chest pain 04/02/2019  . Type 2 diabetes mellitus without complications ([place]) 57/26/2035  . Anemia in chronic kidney disease 02/19/2019  . ESRD (end stage renal disease) ([place]) 02/19/2019  . Secondary hyperparathyroidism of renal origin ([place]) 02/19/2019  . Coagulation defect, unspecified ([place]) 02/19/2019  . Venous (peripheral) insufficiency 08/06/2017  . Encounter for screening for respiratory tuberculosis 01/01/2016  . Hypertension   . 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 Diabetic gastroparesis ([place]) 08/09/2015  . Low back pain 06/09/2014  . Nausea and vomiting 01/18/2012  . Diarrhea, unspecified 01/18/2012  . Obstructive sleep apnea of adult 01/16/2012  . GERD (gastroesophageal reflux disease) 01/16/2012  . Osteoarthritis of both knees 01/16/2012  . Hyperlipidemia, unspecified 07/18/2011  . LEIOMYOMA, UTERUS 01/14/2007  . Morbid obesity ([place]) 07/03/2006  . Former smoker 07/03/2006  . Tension headache 07/03/2006    Mariah Milling, OTR/L 10/21/2019, 7:20 PM  [facility] [address] [place], [place], [address] Phone: [phone]   Fax:  [phone]  Name: ANNSLEY AKKERMAN MRN: [id number] Date of Birth: [birth year]

## 2019-10-26 ENCOUNTER — Ambulatory Visit: Payer: BC Managed Care – PPO

## 2019-10-28 ENCOUNTER — Ambulatory Visit: Payer: BC Managed Care – PPO

## 2019-11-02 ENCOUNTER — Ambulatory Visit: Payer: BC Managed Care – PPO

## 2019-11-04 ENCOUNTER — Encounter: Payer: Self-pay | Admitting: Physical Medicine & Rehabilitation

## 2019-11-04 ENCOUNTER — Ambulatory Visit: Payer: BC Managed Care – PPO

## 2019-11-04 ENCOUNTER — Other Ambulatory Visit: Payer: Self-pay

## 2019-11-04 ENCOUNTER — Encounter
Payer: BC Managed Care – PPO | Attending: Physical Medicine & Rehabilitation | Admitting: Physical Medicine & Rehabilitation

## 2019-11-04 VITALS — BP 89/63 | HR 111 | Temp 98.5°F | Ht 69.0 in | Wt 223.0 lb

## 2019-11-04 DIAGNOSIS — I639 Cerebral infarction, unspecified: Secondary | ICD-10-CM | POA: Insufficient documentation

## 2019-11-04 NOTE — Progress Notes (Signed)
Subjective:    Patient ID: [name], female    DOB: 1960/06/23, 59 y.o.   MRN: [id number]    59 y.o. right-handed female with history of diet-controlled diabetes mellitus, hypertension, end-stage renal disease with hemodialysis.  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  1 level home 5 steps to entry.  Multiple family in the area that works.  Resented 09/18/2019 with acute onset of left-sided weakness facial droop and slurred speech.  Cranial CT scan showed no acute findings.  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  CT angiogram of head and neck no large vessel or medium vessel occlusion.  Patient did not receive TPA.  MRI showed a 1 cm acute infarct in the left cerebellum.  Subcentimeter acute infarct in the deep white matter just posterior to the atrium of the right lateral ventricle.  Admission chemistries BUN 29, creatinine 7.97, troponin high-sensitivity 48-63, hemoglobin 9.5.  Echocardiogram with ejection fraction of 60% no wall motion abnormalities.  Neurology follow-up maintained on aspirin and Plavix x3 weeks then Plavix alone.  Subcutaneous heparin for DVT prophylaxis.  Plan is for 30-day cardiac event monitor as an outpatient.  Hemodialysis ongoing as per renal services.  Permissive hypertension with bouts of tachycardia had been placed on Lopressor.  Tolerating a regular diet.  Patient was admitted for a comprehensive rehab   A follow-up MRI was completed 10/07/2019 due to some increased nausea and vomiting with dizziness showing new acute infarct of the parasagittal left frontal lobe in the ACA territory.  Follow-up neurology services no current changes advised continue Plavix therapy as well as aspirin 325 mg daily  Admit date: 09/23/2019 Discharge date: 10/12/2019  HPI Has not seen PCP or neurology HD on M-W-F Has been to OP rehab a few visits, had a break but has additional visits  scheduled No falls Mod I dressing and bathing amb with RW outside the home , no AD inside the home  Son lives with her  Pain Inventory Average Pain 0 Pain Right Now 0 My pain is na  In the last 24 hours, has pain interfered with the following? General activity 0 Relation with others 0 Enjoyment of life 0 What TIME of day is your pain at its worst? evening Sleep (in general) Good  Pain is worse with: na Pain improves with: na Relief from Meds: 5  Mobility use a walker how many minutes can you walk? 4 ability to climb steps?  yes do you drive?  no  Function disabled: date disabled 09/16/2019  Neuro/Psych No problems in this area  Prior Studies HFU  Physicians involved in your care HFU   Family History  Problem Relation Age of Onset  . Hypertension Mother   . Diabetes Mother   . Cancer Father        lung  . Colon cancer Neg Hx   . Stomach cancer Neg Hx   . Esophageal cancer Neg Hx    Social History   Socioeconomic History  . Marital status: Legally Separated    Spouse name: Not on file  . Number of children: 2  . Years of education: Not on file  . Highest education level: Not on file  Occupational History  . Occupation: housekeeping  Tobacco Use  . Smoking status: Former Smoker    Years: 10.00    Types: Cigarettes    Quit date: 10/16/2011    Years since quitting: 8.0  .  Smokeless tobacco: Never Used  . Tobacco comment: 1 pack will last a week  Vaping Use  . Vaping Use: Never used  Substance and Sexual Activity  . Alcohol use: No    Alcohol/week: 0.0 standard drinks  . Drug use: No  . Sexual activity: Not Currently  Other Topics Concern  . Not on file  Social History Narrative  . Not on file   Social Determinants of Health   Financial Resource Strain:   . Difficulty of Paying Living Expenses:   Food Insecurity:   . Worried About Charity fundraiser in the Last Year:   . Arboriculturist in the Last Year:   Transportation Needs:   .  Film/video editor (Medical):   Marland Kitchen Lack of Transportation (Non-Medical):   Physical Activity:   . Days of Exercise per Week:   . Minutes of Exercise per Session:   Stress:   . Feeling of Stress :   Social Connections:   . Frequency of Communication with Friends and Family:   . Frequency of Social Gatherings with Friends and Family:   . Attends Religious Services:   . Active Member of Clubs or Organizations:   . Attends Archivist Meetings:   Marland Kitchen Marital Status:    Past Surgical History:  Procedure Laterality Date  . AV FISTULA PLACEMENT Left 02/17/2019   Procedure: ARTERIOVENOUS (AV) FISTULA CREATION LEFT UPPER ARM;  Surgeon: Waynetta Sandy, MD;  Location: [facility];  Service: Vascular;  Laterality: Left;  . CATARACT EXTRACTION     right eye  . CESAREAN SECTION     x2  . CHOLECYSTECTOMY     laparoscopic  . FISTULA SUPERFICIALIZATION Left 04/06/2019   Procedure: FISTULA SUPERFICIALIZATION LEFT BRACHIOCEPHALIC;  Surgeon: Waynetta Sandy, MD;  Location: [facility];  Service: Vascular;  Laterality: Left;  Transposition left arm brachiocephalic fistula.  . IR FLUORO GUIDE CV LINE RIGHT  02/09/2019  . IR FLUORO GUIDE CV LINE RIGHT  03/05/2019  . IR US GUIDE VASC ACCESS RIGHT  02/09/2019  . MULTIPLE TOOTH EXTRACTIONS    . REDUCTION MAMMAPLASTY Bilateral    Past Medical History:  Diagnosis Date  . Accelerated hypertension   . Aortic atherosclerosis ([place])   . Arthritis of knee    bilateral  . Diabetes mellitus    type 2 - no meds  . Diabetic gastroparesis ([place])   . Diverticulosis   . ESRD (end stage renal disease) ([place])   . Fibroid uterus   . GERD (gastroesophageal reflux disease)   . Hyperlipidemia   . Hypertension   . HYPERTENSION, BENIGN SYSTEMIC 07/03/2006        . Hypertensive emergency 02/05/2019  . Hypokalemia   . IDA (iron deficiency anemia)   . Nausea   . Renal disorder   . Umbilical hernia   . Wears dentures    BP (!) 89/63   Pulse (!) 111    Temp 98.5 F (36.9 C)   Ht 5\' 9"  (1.753 m)   Wt 223 lb (101.2 kg)   LMP  (LMP Unknown)   SpO2 99%   BMI 32.93 kg/m   Opioid Risk Score:   Fall Risk Score:  `1  Depression screen PHQ 2/9  Depression screen [facility] 2/9 11/04/2019 03/10/2019 02/23/2019 11/12/2018 10/15/2017 08/06/2017 06/23/2017  Decreased Interest 1 0 0 0 0 0 0  Down, Depressed, Hopeless 1 0 0 0 0 0 0  PHQ - 2 Score 2 0 0 0 0 0  0  Altered sleeping 0 - - - - - -  Tired, decreased energy 2 - - - - - -  Change in appetite 0 - - - - - -  Feeling bad or failure about yourself  0 - - - - - -  Trouble concentrating 0 - - - - - -  Moving slowly or fidgety/restless 0 - - - - - -  Suicidal thoughts 0 - - - - - -  PHQ-9 Score 4 - - - - - -  Difficult doing work/chores Not difficult at all - - - - - -  Some recent data might be hidden    Review of Systems  Musculoskeletal: Positive for gait problem.  All other systems reviewed and are negative.      Objective:   Physical Exam Vitals and nursing note reviewed.  Constitutional:      Appearance: She is obese.  Eyes:     General: No visual field deficit.    Extraocular Movements: Extraocular movements intact.     Conjunctiva/sclera: Conjunctivae normal.     Pupils: Pupils are equal, round, and reactive to light.  Neurological:     Mental Status: She is alert and oriented to person, place, and time.     Cranial Nerves: No dysarthria.     Sensory: Sensation is intact.     Motor: No weakness, tremor or abnormal muscle tone.     Coordination: Coordination abnormal. Impaired rapid alternating movements.     Gait: Tandem walk abnormal.  Psychiatric:        Mood and Affect: Mood normal.        Behavior: Behavior normal.        Thought Content: Thought content normal.        Judgment: Judgment normal.           Assessment & Plan:  #1.  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  No visual field deficits, coordination is  only mildly impaired, strength is normal I do think she is ready for graduated return to driving. Recommend neurology follow-up have made referral  2.  End-stage renal disease continue Monday Wednesday Friday dialysis as per nephrology. Patient is having low blood pressures with dialysis.  I have recommended patient stop her metoprolol and follow-up with nephrology on this. We also discussed that the patient needs to follow-up with your primary care physician.  Physical medicine rehab on as-needed basis

## 2019-11-04 NOTE — Patient Instructions (Addendum)
Stop metoprolol, if blood pressure in dialysis gets hi, talk to your kidney MD  Graduated return to driving instructions were provided. It is recommended that the patient first drives with another licensed driver in an empty parking lot. If the patient does well with this, and they can drive on a quiet street with the licensed driver. If the patient does well with this they can drive on a busy street with a licensed driver. If the patient does well with this, the next time out they can go by himself. For the first month after resuming driving, I recommend no nighttime or Interstate driving.

## 2019-11-09 ENCOUNTER — Ambulatory Visit: Payer: BC Managed Care – PPO | Attending: Physician Assistant | Admitting: Occupational Therapy

## 2019-11-09 DIAGNOSIS — R41842 Visuospatial deficit: Secondary | ICD-10-CM | POA: Insufficient documentation

## 2019-11-09 DIAGNOSIS — R278 Other lack of coordination: Secondary | ICD-10-CM | POA: Insufficient documentation

## 2019-11-09 DIAGNOSIS — M6281 Muscle weakness (generalized): Secondary | ICD-10-CM | POA: Insufficient documentation

## 2019-11-09 DIAGNOSIS — I69354 Hemiplegia and hemiparesis following cerebral infarction affecting left non-dominant side: Secondary | ICD-10-CM | POA: Insufficient documentation

## 2019-11-09 DIAGNOSIS — R2681 Unsteadiness on feet: Secondary | ICD-10-CM | POA: Insufficient documentation

## 2019-11-11 ENCOUNTER — Ambulatory Visit: Payer: BC Managed Care – PPO

## 2019-11-16 ENCOUNTER — Other Ambulatory Visit: Payer: Self-pay

## 2019-11-16 ENCOUNTER — Ambulatory Visit: Payer: BC Managed Care – PPO

## 2019-11-16 DIAGNOSIS — M6281 Muscle weakness (generalized): Secondary | ICD-10-CM

## 2019-11-16 DIAGNOSIS — R2681 Unsteadiness on feet: Secondary | ICD-10-CM

## 2019-11-16 DIAGNOSIS — R41842 Visuospatial deficit: Secondary | ICD-10-CM | POA: Diagnosis present

## 2019-11-16 DIAGNOSIS — I69354 Hemiplegia and hemiparesis following cerebral infarction affecting left non-dominant side: Secondary | ICD-10-CM | POA: Diagnosis present

## 2019-11-16 DIAGNOSIS — R278 Other lack of coordination: Secondary | ICD-10-CM | POA: Diagnosis present

## 2019-11-16 NOTE — Patient Instructions (Addendum)
Access Code: [email or web address] URL: [email or web address] Date: 11/16/2019 Prepared by: Baldomero Lamy  Exercises Sit to Stand without Arm Support - 1 x daily - 5 x weekly - 3 sets - 5 reps Romberg Stance with Eyes Closed - 1 x daily - 5 x weekly - 1 sets - 3 reps - 30 hold Romberg Stance on Foam Pad with Head Rotation - 1 x daily - 5 x weekly - 2 sets - 10 reps Mini Squat with Counter Support - 1 x daily - 5 x weekly - 2 sets - 10 reps Side Stepping with Resistance at Thighs - 1 x daily - 5 x weekly - 2 sets - 10 reps

## 2019-11-16 NOTE — Therapy (Signed)
[facility] [address] [place], [place], [address] Phone: [phone]   Fax:  [phone]  Physical Therapy Treatment  Patient Details  Name: [name] MRN: [id number] Date of Birth: 15-Feb-1961 Referring Provider (PT): Jethro Bolus, PA-C   Encounter Date: 11/16/2019   PT End of Session - 11/16/19 1538    Visit Number 2    Number of Visits 16    Date for PT Re-Evaluation 12/14/19    Authorization Type BCBS Medicaid    PT Start Time 4332    PT Stop Time 9518    PT Time Calculation (min) 41 min    Equipment Utilized During Treatment Gait belt    Activity Tolerance Patient tolerated treatment well    Behavior During Therapy WFL for tasks assessed/performed           Past Medical History:  Diagnosis Date   Accelerated hypertension    Aortic atherosclerosis ([place])    Arthritis of knee    bilateral   Diabetes mellitus    type 2 - no meds   Diabetic gastroparesis ([employer])    Diverticulosis    ESRD (end stage renal disease) ([place])    Fibroid uterus    GERD (gastroesophageal reflux disease)    Hyperlipidemia    Hypertension    HYPERTENSION, BENIGN SYSTEMIC 07/03/2006         Hypertensive emergency 02/05/2019   Hypokalemia    IDA (iron deficiency anemia)    Nausea    Renal disorder    Umbilical hernia    Wears dentures     Past Surgical History:  Procedure Laterality Date   AV FISTULA PLACEMENT Left 02/17/2019   Procedure: ARTERIOVENOUS (AV) FISTULA CREATION LEFT UPPER ARM;  Surgeon: Waynetta Sandy, MD;  Location: [facility];  Service: Vascular;  Laterality: Left;   CATARACT EXTRACTION     right eye   CESAREAN SECTION     x2   CHOLECYSTECTOMY     laparoscopic   FISTULA SUPERFICIALIZATION Left 04/06/2019   Procedure: FISTULA SUPERFICIALIZATION LEFT BRACHIOCEPHALIC;  Surgeon: Waynetta Sandy, MD;  Location: [facility];  Service: Vascular;  Laterality: Left;   Transposition left arm brachiocephalic fistula.   IR FLUORO GUIDE CV LINE RIGHT  02/09/2019   IR FLUORO GUIDE CV LINE RIGHT  03/05/2019   IR US GUIDE VASC ACCESS RIGHT  02/09/2019   MULTIPLE TOOTH EXTRACTIONS     REDUCTION MAMMAPLASTY Bilateral     There were no vitals filed for this visit.   Subjective Assessment - 11/16/19 1534    Subjective Patient reports that she has been progressing well since initial evaluation. Patient reports that she has stopped using the RW. No near falls or fall since stopping walking with the RW.    Pertinent History diabetes mellitus, hypertension, end-stage renal disease with hemodialysis    Limitations Lifting;Walking;Standing;House hold activities    How long can you sit comfortably? no issues    How long can you stand comfortably? 5 min    How long can you walk comfortably? 5-10 min    Patient Stated Goals get back to normal    Currently in Pain? No/denies                             [facility] Adult PT Treatment/Exercise - 11/16/19 0001      Transfers   Transfers Sit to Stand;Stand to Sit    Sit to Stand 6:  Modified independent (Device/Increase time)    Stand to Sit 6: Modified independent (Device/Increase time)    Comments completed from mat at standard height chair without UE support. Completed with LLE positioned slightly posterior for improved LLE strengthening.       Ambulation/Gait   Ambulation/Gait Yes    Ambulation/Gait Assistance 6: Modified independent (Device/Increase time)    Ambulation Distance (Feet) 460 Feet    Assistive device None    Gait Pattern Trunk flexed;Wide base of support    Ambulation Surface Level;Indoor           Completed all of the following exercises and issued with initial HEP. All balance exercises, educated to complete in a corner within the home and a chair placed in front for improved safety.  Access Code: [email or web address] URL: [email or web address] Date: 11/16/2019 Prepared by:  Baldomero Lamy  Exercises Sit to Stand without Arm Support - 1 x daily - 5 x weekly - 3 sets - 5 reps - Educated to complete with LLE positioned slightly posterior to allow for further strengthening  Romberg Stance with Eyes Closed - 1 x daily - 5 x weekly - 1 sets - 3 reps - 30 hold Romberg Stance on Foam Pad with Head Rotation - 1 x daily - 5 x weekly - 2 sets - 10 reps Mini Squat with Counter Support - 1 x daily - 5 x weekly - 2 sets - 10 reps Side Stepping with Resistance at Thighs - 1 x daily - 5 x weekly - 2 sets - 10 reps        PT Education - 11/16/19 1556    Education Details Educated on initial HEP    Person(s) Educated Patient    Methods Explanation;Demonstration;Handout    Comprehension Verbalized understanding;Returned demonstration            PT Short Term Goals - 10/19/19 1649      PT SHORT TERM GOAL #1   Title Patient will be able to go up and down 15 steps with use of one rail and with step to pattern to improve ability to access her aparitment.    Baseline bil hand rail, requires 3 rest breaks to climb up to 15 steps    Time 4    Period Weeks    Status New    Target Date 11/16/19      PT SHORT TERM GOAL #2   Title Pt will be able to perform  sit to stand and stand to sit with proper eccentric control without use of UE to improve functional strength    Baseline Pt unable to perform sit to stand without UE support    Time 4    Period Weeks    Status New    Target Date 11/16/19      PT SHORT TERM GOAL #3   Title Patient will be able to ambulate without a cane for 300 feet to improve in home ambulation.    Baseline pt reports she walks at home 15 feet without AD with furniture/wall surfing    Time 4    Period Weeks    Status New    Target Date 11/16/19             PT Long Term Goals - 10/19/19 1707      PT LONG TERM GOAL #1   Title Pt will score 56/56 on BBS to improve balance and reduce fall risk    Baseline 50/56    Time 8  Period Weeks      Status New    Target Date 12/14/19      PT LONG TERM GOAL #2   Title Pt will be able to ambulate 1000 feet SBA without AD to improve walking    Baseline 30 feet without AD with furniture surfing    Time 8    Period Weeks    Status New    Target Date 12/14/19      PT LONG TERM GOAL #3   Title Pt will be able to go up and down steps with use of one rail and be able to carry 10-15 lbs groceries in her hand to improve access to her apartment    Baseline must use bil rails, requires 3 breaks    Time 8    Period Weeks    Status New    Target Date 12/14/19                 Plan - 11/16/19 1613    Clinical Impression Statement Today's skilled session included gait training without AD. Rest of session included initiating HEP focused on standing strengthening and balance exercises, with patient tolerating well. Patient did have dizziness with vertical head turns, and therefore not included on HEP at this time. Patient will continue to benefit from skilled PT services    Personal Factors and Comorbidities Comorbidity 3+;Time since onset of injury/illness/exacerbation    Comorbidities diabetes mellitus, hypertension, end-stage renal disease with hemodialysis    Examination-Activity Limitations Caring for Others;Carry;Lift;Squat;Stairs;Stand;Transfers    Examination-Participation Restrictions Church;Cleaning;Community Activity;Driving;Laundry;Meal Prep    Stability/Clinical Decision Making Evolving/Moderate complexity    Rehab Potential Good    PT Frequency Other (comment)   1x week for first month, then 1-2x/week for 8 weeks.   PT Duration 8 weeks    PT Treatment/Interventions ADLs/Self Care Home Management;Gait training;Stair training;Therapeutic activities;Therapeutic exercise;Balance training;Neuromuscular re-education;Patient/family education;Manual techniques;Passive range of motion;Joint Manipulations    PT Next Visit Plan How is HEP going? (update as needed). Complete stair  trianing, conintue balance training. Check STG or change date due to delay in being seen.    PT Home Exercise Plan walking program (start with 5 min and progress it to 30 min with LRAD)    Consulted and Agree with Plan of Care Patient;Family member/caregiver    Family Member Consulted Daughter Merchandiser, retail)           Patient will benefit from skilled therapeutic intervention in order to improve the following deficits and impairments:  Abnormal gait, Decreased activity tolerance, Decreased balance, Decreased endurance, Decreased mobility, Decreased strength, Difficulty walking, Decreased safety awareness, Postural dysfunction  Visit Diagnosis: Hemiplegia and hemiparesis following cerebral infarction affecting left non-dominant side (HCC)  Unsteadiness on feet  Muscle weakness (generalized)     Problem List Patient Active Problem List   Diagnosis Date Noted   Labile blood pressure    Sinus tachycardia    ESRD on dialysis ([place])    Cerebellar cerebrovascular accident (CVA) without late effect 09/23/2019   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[place])    Leukocytosis    Left-sided weakness    Acute ischemic stroke ([place]) 09/19/2019   Stroke ([place]) 09/18/2019   Intractable nausea and vomiting 04/20/2019   Atypical chest pain 04/02/2019   Type 2 diabetes mellitus without complications ([place]) 04/28/8249   Anemia in chronic kidney disease 02/19/2019   ESRD (end stage renal disease) ([place]) 02/19/2019   Secondary hyperparathyroidism of renal origin ([place]) 02/19/2019   Coagulation defect, unspecified ([place])  02/19/2019   Venous (peripheral) insufficiency 08/06/2017   Encounter for screening for respiratory tuberculosis 01/01/2016   Hypertension    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 [place], [place], [address] Phone: [phone]   Fax:  [phone]  Name: [name] MRN: [id number] Date of Birth: Jun 26, 1960

## 2019-11-18 ENCOUNTER — Other Ambulatory Visit: Payer: Self-pay

## 2019-11-18 ENCOUNTER — Ambulatory Visit: Payer: BC Managed Care – PPO

## 2019-11-18 VITALS — BP 105/66 | HR 110

## 2019-11-18 DIAGNOSIS — M6281 Muscle weakness (generalized): Secondary | ICD-10-CM

## 2019-11-18 DIAGNOSIS — I69354 Hemiplegia and hemiparesis following cerebral infarction affecting left non-dominant side: Secondary | ICD-10-CM

## 2019-11-18 DIAGNOSIS — R2681 Unsteadiness on feet: Secondary | ICD-10-CM

## 2019-11-18 NOTE — Therapy (Signed)
[facility] [address] [place], [place], [address] Phone: [phone]   Fax:  [phone]  Physical Therapy Treatment  Patient Details  Name: [name] MRN: [id number] Date of Birth: 1960/09/19 Referring Provider (PT): Jethro Bolus, PA-C   Encounter Date: 11/18/2019   PT End of Session - 11/18/19 1754    Visit Number 3    Number of Visits 16    Date for PT Re-Evaluation 12/14/19    Authorization Type BCBS Medicaid    PT Start Time 1705    PT Stop Time 1750    PT Time Calculation (min) 45 min    Equipment Utilized During Treatment Gait belt    Activity Tolerance Patient tolerated treatment well    Behavior During Therapy WFL for tasks assessed/performed           Past Medical History:  Diagnosis Date   Accelerated hypertension    Aortic atherosclerosis ([place])    Arthritis of knee    bilateral   Diabetes mellitus    type 2 - no meds   Diabetic gastroparesis ([place])    Diverticulosis    ESRD (end stage renal disease) ([place])    Fibroid uterus    GERD (gastroesophageal reflux disease)    Hyperlipidemia    Hypertension    HYPERTENSION, BENIGN SYSTEMIC 07/03/2006         Hypertensive emergency 02/05/2019   Hypokalemia    IDA (iron deficiency anemia)    Nausea    Renal disorder    Umbilical hernia    Wears dentures     Past Surgical History:  Procedure Laterality Date   AV FISTULA PLACEMENT Left 02/17/2019   Procedure: ARTERIOVENOUS (AV) FISTULA CREATION LEFT UPPER ARM;  Surgeon: Waynetta Sandy, MD;  Location: [facility];  Service: Vascular;  Laterality: Left;   CATARACT EXTRACTION     right eye   CESAREAN SECTION     x2   CHOLECYSTECTOMY     laparoscopic   FISTULA SUPERFICIALIZATION Left 04/06/2019   Procedure: FISTULA SUPERFICIALIZATION LEFT BRACHIOCEPHALIC;  Surgeon: Waynetta Sandy, MD;  Location: [facility];  Service: Vascular;  Laterality: Left;   Transposition left arm brachiocephalic fistula.   IR FLUORO GUIDE CV LINE RIGHT  02/09/2019   IR FLUORO GUIDE CV LINE RIGHT  03/05/2019   IR US GUIDE VASC ACCESS RIGHT  02/09/2019   MULTIPLE TOOTH EXTRACTIONS     REDUCTION MAMMAPLASTY Bilateral     Vitals:   11/18/19 1725  BP: 105/66  Pulse: (!) 110     Subjective Assessment - 11/18/19 1724    Subjective Pt reports she is doing better. She is walking without AD. She was wiped all day yesterday after dialysis.    Pertinent History diabetes mellitus, hypertension, end-stage renal disease with hemodialysis    Limitations Lifting;Walking;Standing;House hold activities    How long can you sit comfortably? no issues    How long can you stand comfortably? 5 min    How long can you walk comfortably? 5-10 min    Patient Stated Goals get back to normal    Currently in Pain? No/denies             Reviewed HEP Side steps: green band around knees: 3 x 10 R and L Sit to stand: 2 x 10 no HHA from standard chair Monster walks: green band at knees: 3 x 10 steps fwd and bwd, min A reqired 2x during bwd walking due to LOB Romberg stance  EC: 1' on pillow Romber stance on pillow: 10x head rotations R and L Sit to stand: no HHA: 2 x 10   Access Code: [email or web address] URL: [email or web address] Date: 11/18/2019 Prepared by: Markus Jarvis  Exercises Sit to Stand without Arm Support - 1 x daily - 5 x weekly - 3 sets - 5 reps Romberg Stance with Eyes Closed - 1 x daily - 5 x weekly - 1 sets - 3 reps - 30 hold Romberg Stance on Foam Pad with Head Rotation - 1 x daily - 5 x weekly - 2 sets - 10 reps Side Stepping with Resistance at Thighs - 1 x daily - 5 x weekly - 2 sets - 10 reps Forward Monster Walks - 1 x daily - 7 x weekly - 2 sets - 10 reps Backward Monster Walks - 1 x daily - 7 x weekly - 2 sets - 10 reps                         PT Short Term Goals - 10/19/19 1649      PT SHORT TERM GOAL #1   Title  Patient will be able to go up and down 15 steps with use of one rail and with step to pattern to improve ability to access her aparitment.    Baseline bil hand rail, requires 3 rest breaks to climb up to 15 steps    Time 4    Period Weeks    Status New    Target Date 11/16/19      PT SHORT TERM GOAL #2   Title Pt will be able to perform  sit to stand and stand to sit with proper eccentric control without use of UE to improve functional strength    Baseline Pt unable to perform sit to stand without UE support    Time 4    Period Weeks    Status New    Target Date 11/16/19      PT SHORT TERM GOAL #3   Title Patient will be able to ambulate without a cane for 300 feet to improve in home ambulation.    Baseline pt reports she walks at home 15 feet without AD with furniture/wall surfing    Time 4    Period Weeks    Status New    Target Date 11/16/19             PT Long Term Goals - 10/19/19 1707      PT LONG TERM GOAL #1   Title Pt will score 56/56 on BBS to improve balance and reduce fall risk    Baseline 50/56    Time 8    Period Weeks    Status New    Target Date 12/14/19      PT LONG TERM GOAL #2   Title Pt will be able to ambulate 1000 feet SBA without AD to improve walking    Baseline 30 feet without AD with furniture surfing    Time 8    Period Weeks    Status New    Target Date 12/14/19      PT LONG TERM GOAL #3   Title Pt will be able to go up and down steps with use of one rail and be able to carry 10-15 lbs groceries in her hand to improve access to her apartment    Baseline must use bil rails, requires 3 breaks    Time 8  Period Weeks    Status New    Target Date 12/14/19                 Plan - 11/18/19 1725    Clinical Impression Statement Today's session, we focused on reviwing HEP from previous session to assure compliance. We also spent    Personal Factors and Comorbidities Comorbidity 3+;Time since onset of injury/illness/exacerbation     Comorbidities diabetes mellitus, hypertension, end-stage renal disease with hemodialysis    Examination-Activity Limitations Caring for Others;Carry;Lift;Squat;Stairs;Stand;Transfers    Examination-Participation Restrictions Church;Cleaning;Community Activity;Driving;Laundry;Meal Prep    Stability/Clinical Decision Making Evolving/Moderate complexity    Rehab Potential Good    PT Frequency Other (comment)   1x week for first month, then 1-2x/week for 8 weeks.   PT Duration 8 weeks    PT Treatment/Interventions ADLs/Self Care Home Management;Gait training;Stair training;Therapeutic activities;Therapeutic exercise;Balance training;Neuromuscular re-education;Patient/family education;Manual techniques;Passive range of motion;Joint Manipulations    PT Next Visit Plan How is HEP going? (update as needed). Complete stair trianing, conintue balance training. Check STG or change date due to delay in being seen.    PT Home Exercise Plan walking program (start with 5 min and progress it to 30 min with LRAD)    Consulted and Agree with Plan of Care Patient;Family member/caregiver    Family Member Consulted Daughter Merchandiser, retail)           Patient will benefit from skilled therapeutic intervention in order to improve the following deficits and impairments:  Abnormal gait, Decreased activity tolerance, Decreased balance, Decreased endurance, Decreased mobility, Decreased strength, Difficulty walking, Decreased safety awareness, Postural dysfunction  Visit Diagnosis: Unsteadiness on feet  Hemiplegia and hemiparesis following cerebral infarction affecting left non-dominant side (HCC)  Muscle weakness (generalized)     Problem List Patient Active Problem List   Diagnosis Date Noted   Labile blood pressure    Sinus tachycardia    ESRD on dialysis ([place])    Cerebellar cerebrovascular accident (CVA) without late effect 09/23/2019   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[place])     Leukocytosis    Left-sided weakness    Acute ischemic stroke ([place]) 09/19/2019   Stroke ([place]) 09/18/2019   Intractable nausea and vomiting 04/20/2019   Atypical chest pain 04/02/2019   Type 2 diabetes mellitus without complications ([place]) 63/78/5885   Anemia in chronic kidney disease 02/19/2019   ESRD (end stage renal disease) ([place]) 02/19/2019   Secondary hyperparathyroidism of renal origin ([place]) 02/19/2019   Coagulation defect, unspecified ([place]) 02/19/2019   Venous (peripheral) insufficiency 08/06/2017   Encounter for screening for respiratory tuberculosis 01/01/2016   Hypertension    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   Diabetic gastroparesis ([place]) 08/09/2015   Low back pain 06/09/2014   Nausea and vomiting 01/18/2012   Diarrhea, unspecified 01/18/2012   Obstructive sleep apnea of adult 01/16/2012   GERD (gastroesophageal reflux disease) 01/16/2012   Osteoarthritis of both knees 01/16/2012   Hyperlipidemia, unspecified 07/18/2011   LEIOMYOMA, UTERUS 01/14/2007   Morbid obesity ([place]) 07/03/2006   Former smoker 07/03/2006   Tension headache 07/03/2006    Kerrie Pleasure 11/18/2019, 5:55 PM  [facility] [address] [place], [place], [address] Phone: [phone]   Fax:  [phone]  Name: TAKARA SERMONS MRN: [id number] Date of Birth: 09/12/1960

## 2019-11-23 ENCOUNTER — Ambulatory Visit: Payer: BC Managed Care – PPO

## 2019-11-25 ENCOUNTER — Ambulatory Visit: Payer: BC Managed Care – PPO

## 2019-11-25 ENCOUNTER — Ambulatory Visit: Payer: BC Managed Care – PPO | Admitting: Occupational Therapy

## 2019-11-30 ENCOUNTER — Ambulatory Visit: Payer: BC Managed Care – PPO

## 2019-11-30 ENCOUNTER — Encounter: Payer: Self-pay | Admitting: Occupational Therapy

## 2019-11-30 ENCOUNTER — Ambulatory Visit: Payer: BC Managed Care – PPO | Admitting: Occupational Therapy

## 2019-11-30 ENCOUNTER — Other Ambulatory Visit: Payer: Self-pay

## 2019-11-30 DIAGNOSIS — R2681 Unsteadiness on feet: Secondary | ICD-10-CM

## 2019-11-30 DIAGNOSIS — R41842 Visuospatial deficit: Secondary | ICD-10-CM

## 2019-11-30 DIAGNOSIS — I69354 Hemiplegia and hemiparesis following cerebral infarction affecting left non-dominant side: Secondary | ICD-10-CM

## 2019-11-30 DIAGNOSIS — M6281 Muscle weakness (generalized): Secondary | ICD-10-CM

## 2019-11-30 DIAGNOSIS — R278 Other lack of coordination: Secondary | ICD-10-CM

## 2019-11-30 NOTE — Therapy (Signed)
[facility] [address] [place], [place], [address] Phone: [phone]   Fax:  [phone]  Physical Therapy Treatment  Patient Details  Name: [name] MRN: [id number] Date of Birth: April 29, 1961 Referring Provider (PT): Jethro Bolus, PA-C   Encounter Date: 11/30/2019   PT End of Session - 11/30/19 1613    Visit Number 4    Number of Visits 16    Date for PT Re-Evaluation 12/14/19    Authorization Type BCBS Medicaid    PT Start Time 3474    PT Stop Time 1615    PT Time Calculation (min) 45 min    Equipment Utilized During Treatment Gait belt    Activity Tolerance Patient tolerated treatment well    Behavior During Therapy [facility] for tasks assessed/performed           Past Medical History:  Diagnosis Date  . Accelerated hypertension   . Aortic atherosclerosis ([place])   . Arthritis of knee    bilateral  . Diabetes mellitus    type 2 - no meds  . Diabetic gastroparesis ([place])   . Diverticulosis   . ESRD (end stage renal disease) ([place])   . Fibroid uterus   . GERD (gastroesophageal reflux disease)   . Hyperlipidemia   . Hypertension   . HYPERTENSION, BENIGN SYSTEMIC 07/03/2006        . Hypertensive emergency 02/05/2019  . Hypokalemia   . IDA (iron deficiency anemia)   . Nausea   . Renal disorder   . Umbilical hernia   . Wears dentures     Past Surgical History:  Procedure Laterality Date  . AV FISTULA PLACEMENT Left 02/17/2019   Procedure: ARTERIOVENOUS (AV) FISTULA CREATION LEFT UPPER ARM;  Surgeon: Waynetta Sandy, MD;  Location: [facility];  Service: Vascular;  Laterality: Left;  . CATARACT EXTRACTION     right eye  . CESAREAN SECTION     x2  . CHOLECYSTECTOMY     laparoscopic  . FISTULA SUPERFICIALIZATION Left 04/06/2019   Procedure: FISTULA SUPERFICIALIZATION LEFT BRACHIOCEPHALIC;  Surgeon: Waynetta Sandy, MD;  Location: [facility];  Service: Vascular;  Laterality: Left;   Transposition left arm brachiocephalic fistula.  . IR FLUORO GUIDE CV LINE RIGHT  02/09/2019  . IR FLUORO GUIDE CV LINE RIGHT  03/05/2019  . IR US GUIDE VASC ACCESS RIGHT  02/09/2019  . MULTIPLE TOOTH EXTRACTIONS    . REDUCTION MAMMAPLASTY Bilateral     There were no vitals filed for this visit.   Subjective Assessment - 11/30/19 1539    Subjective Pt reports that she had been feeling nasuseated and been vomitting since Tuesday. This past weekend, she started feeling better. She doesn't feel 100% right now. Upon further questioning, patient was also reporting of spinning sensation and this has reoccured in past and lasts from 1-3 weeks.    Pertinent History diabetes mellitus, hypertension, end-stage renal disease with hemodialysis    Limitations Lifting;Walking;Standing;House hold activities    How long can you sit comfortably? no issues    How long can you stand comfortably? 5 min    How long can you walk comfortably? 5-10 min    Patient Stated Goals get back to normal               Patient was educated on possible BPPV responsible for her to feel dizzy when she turned her head in bed or performed sit to stand. Pt also educated to call her doctor at her  next aute flare up to seek PT treatment for vertigo. Written information on BPPV given to patient.  Sit to stand: 2 x 10 from mat table Tandem walking: 4 x 10 feet EC fwd walking: 4 x 15 feet EC bwd alking: 4 x 15 feet Fwd step up: airex foam: no HHA: 10x R and L Fwd step up: 6" box: no HHA; 10x R and L  Access Code: [email or web address] URL: [email or web address] Date: 11/18/2019 Prepared by: Markus Jarvis  Exercises Sit to Stand without Arm Support - 1 x daily - 5 x weekly - 3 sets - 5 reps Romberg Stance with Eyes Closed - 1 x daily - 5 x weekly - 1 sets - 3 reps - 30 hold Romberg Stance on Foam Pad with Head Rotation - 1 x daily - 5 x weekly - 2 sets - 10 reps Side Stepping with Resistance at Thighs - 1 x daily - 5 x  weekly - 2 sets - 10 reps Forward Monster Walks - 1 x daily - 7 x weekly - 2 sets - 10 reps Backward Monster Walks - 1 x daily - 7 x weekly - 2 sets - 10 reps                            PT Short Term Goals - 10/19/19 1649      PT SHORT TERM GOAL #1   Title Patient will be able to go up and down 15 steps with use of one rail and with step to pattern to improve ability to access her aparitment.    Baseline bil hand rail, requires 3 rest breaks to climb up to 15 steps    Time 4    Period Weeks    Status New    Target Date 11/16/19      PT SHORT TERM GOAL #2   Title Pt will be able to perform  sit to stand and stand to sit with proper eccentric control without use of UE to improve functional strength    Baseline Pt unable to perform sit to stand without UE support    Time 4    Period Weeks    Status New    Target Date 11/16/19      PT SHORT TERM GOAL #3   Title Patient will be able to ambulate without a cane for 300 feet to improve in home ambulation.    Baseline pt reports she walks at home 15 feet without AD with furniture/wall surfing    Time 4    Period Weeks    Status New    Target Date 11/16/19             PT Long Term Goals - 10/19/19 1707      PT LONG TERM GOAL #1   Title Pt will score 56/56 on BBS to improve balance and reduce fall risk    Baseline 50/56    Time 8    Period Weeks    Status New    Target Date 12/14/19      PT LONG TERM GOAL #2   Title Pt will be able to ambulate 1000 feet SBA without AD to improve walking    Baseline 30 feet without AD with furniture surfing    Time 8    Period Weeks    Status New    Target Date 12/14/19      PT LONG TERM GOAL #3   Title  Pt will be able to go up and down steps with use of one rail and be able to carry 10-15 lbs groceries in her hand to improve access to her apartment    Baseline must use bil rails, requires 3 breaks    Time 8    Period Weeks    Status New    Target Date 12/14/19                  Plan - 11/30/19 1612    Clinical Impression Statement Pt tolerated session well. Pt demonstrates fatigue and requires seated breaks. Pt is no longer feeling vertigo like symptoms.    Personal Factors and Comorbidities Comorbidity 3+;Time since onset of injury/illness/exacerbation    Comorbidities diabetes mellitus, hypertension, end-stage renal disease with hemodialysis    Examination-Activity Limitations Caring for Others;Carry;Lift;Squat;Stairs;Stand;Transfers    Examination-Participation Restrictions Church;Cleaning;Community Activity;Driving;Laundry;Meal Prep    Stability/Clinical Decision Making Evolving/Moderate complexity    Rehab Potential Good    PT Frequency Other (comment)   1x week for first month, then 1-2x/week for 8 weeks.   PT Duration 8 weeks    PT Treatment/Interventions ADLs/Self Care Home Management;Gait training;Stair training;Therapeutic activities;Therapeutic exercise;Balance training;Neuromuscular re-education;Patient/family education;Manual techniques;Passive range of motion;Joint Manipulations    PT Next Visit Plan How is HEP going? (update as needed). Complete stair trianing, conintue balance training. Check STG or change date due to delay in being seen.    PT Home Exercise Plan walking program (start with 5 min and progress it to 30 min with LRAD)    Consulted and Agree with Plan of Care Patient;Family member/caregiver    Family Member Consulted Daughter Merchandiser, retail)           Patient will benefit from skilled therapeutic intervention in order to improve the following deficits and impairments:  Abnormal gait, Decreased activity tolerance, Decreased balance, Decreased endurance, Decreased mobility, Decreased strength, Difficulty walking, Decreased safety awareness, Postural dysfunction  Visit Diagnosis: Unsteadiness on feet  Hemiplegia and hemiparesis following cerebral infarction affecting left non-dominant side (HCC)  Muscle weakness  (generalized)     Problem List Patient Active Problem List   Diagnosis Date Noted  . Labile blood pressure   . Sinus tachycardia   . ESRD on dialysis ([place])   . 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 Leukocytosis   . Left-sided weakness   . Acute ischemic stroke ([place]) 09/19/2019  . Stroke ([place]) 09/18/2019  . Intractable nausea and vomiting 04/20/2019  . Atypical chest pain 04/02/2019  . Type 2 diabetes mellitus without complications ([place]) 68/04/7516  . Anemia in chronic kidney disease 02/19/2019  . ESRD (end stage renal disease) ([place]) 02/19/2019  . Secondary hyperparathyroidism of renal origin ([place]) 02/19/2019  . Coagulation defect, unspecified ([place]) 02/19/2019  . Venous (peripheral) insufficiency 08/06/2017  . Encounter for screening for respiratory tuberculosis 01/01/2016  . Hypertension   . 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 Diabetic gastroparesis ([place]) 08/09/2015  . Low back pain 06/09/2014  . Nausea and vomiting 01/18/2012  . Diarrhea, unspecified 01/18/2012  . Obstructive sleep apnea of adult 01/16/2012  . GERD (gastroesophageal reflux disease) 01/16/2012  . Osteoarthritis of both knees 01/16/2012  . Hyperlipidemia, unspecified 07/18/2011  . LEIOMYOMA, UTERUS 01/14/2007  . Morbid obesity ([place]) 07/03/2006  . Former smoker 07/03/2006  . Tension headache 07/03/2006    Kerrie Pleasure, PT 11/30/2019, 4:15 PM  [facility] [address] [place], [place], [address] Phone: [phone]   Fax:  [phone]  Name: PAVIELLE BIGGAR MRN: [id number] Date of Birth: 12-Mar-1961

## 2019-11-30 NOTE — Patient Instructions (Signed)
Access Code: [email or web address] URL: [email or web address] Date: 11/30/2019 Prepared by: Marlowe Sax  Exercises Shoulder External Rotation and Scapular Retraction with Resistance - 1 x daily - 7 x weekly - 3 sets - 10 reps Shoulder Extension with Resistance - 1 x daily - 7 x weekly - 3 sets - 10 reps Standing Shoulder Flexion with Resistance - 1 x daily - 7 x weekly - 3 sets - 10 reps Seated Single Arm Shoulder Horizontal Abduction with Anchored Resistance on Swiss Ball - 1 x daily - 7 x weekly - 3 sets - 10 reps

## 2019-11-30 NOTE — Therapy (Signed)
[facility] [address] [place], [place], [address] Phone: [phone]   Fax:  [phone]  Occupational Therapy Treatment  Patient Details  Name: [name] MRN: [id number] Date of Birth: 1961/02/17 Referring Provider (OT): Marlowe Shores   Encounter Date: 11/30/2019   OT End of Session - 11/30/19 1656    Visit Number 2    Number of Visits 9    Date for OT Re-Evaluation 12/05/19    Authorization Type State BCBS/Medicaid - [place]    OT Start Time 1615    OT Stop Time 1655    OT Time Calculation (min) 40 min    Activity Tolerance Patient tolerated treatment well    Behavior During Therapy [facility] for tasks assessed/performed           Past Medical History:  Diagnosis Date  . Accelerated hypertension   . Aortic atherosclerosis ([place])   . Arthritis of knee    bilateral  . Diabetes mellitus    type 2 - no meds  . Diabetic gastroparesis ([place])   . Diverticulosis   . ESRD (end stage renal disease) ([place])   . Fibroid uterus   . GERD (gastroesophageal reflux disease)   . Hyperlipidemia   . Hypertension   . HYPERTENSION, BENIGN SYSTEMIC 07/03/2006        . Hypertensive emergency 02/05/2019  . Hypokalemia   . IDA (iron deficiency anemia)   . Nausea   . Renal disorder   . Umbilical hernia   . Wears dentures     Past Surgical History:  Procedure Laterality Date  . AV FISTULA PLACEMENT Left 02/17/2019   Procedure: ARTERIOVENOUS (AV) FISTULA CREATION LEFT UPPER ARM;  Surgeon: Waynetta Sandy, MD;  Location: [facility];  Service: Vascular;  Laterality: Left;  . CATARACT EXTRACTION     right eye  . CESAREAN SECTION     x2  . CHOLECYSTECTOMY     laparoscopic  . FISTULA SUPERFICIALIZATION Left 04/06/2019   Procedure: FISTULA SUPERFICIALIZATION LEFT BRACHIOCEPHALIC;  Surgeon: Waynetta Sandy, MD;  Location: [facility];  Service: Vascular;  Laterality: Left;  Transposition left arm brachiocephalic fistula.  .  IR FLUORO GUIDE CV LINE RIGHT  02/09/2019  . IR FLUORO GUIDE CV LINE RIGHT  03/05/2019  . IR US GUIDE VASC ACCESS RIGHT  02/09/2019  . MULTIPLE TOOTH EXTRACTIONS    . REDUCTION MAMMAPLASTY Bilateral     There were no vitals filed for this visit.   Subjective Assessment - 11/30/19 1624    Subjective  I am doing more for myself.  I was released for driving    Currently in Pain? No/denies    Pain Score 0-No pain              [facility] OT Assessment - 11/30/19 0001      AROM   Right/Left Shoulder Left    Left Shoulder Flexion 150 Degrees    Left Shoulder ABduction 110 Degrees      Strength   Left Shoulder Flexion 4/5      Hand Function   Left Hand Grip (lbs) 54.4                    OT Treatments/Exercises (OP) - 11/30/19 0001      ADLs   ADL Comments Reviewed OT goals as it has been several weeks since OT evaluation. Patient has shown significant recovery since that time.  She has returned to cooking, light cleaning, and driving/shopping.  Neurological Re-education Exercises   Other Exercises 1 Yellow theraband exercise to address shoulder flexion, extension, abduction and external rotation.  See patient instructions                  OT Education - 11/30/19 1656    Education Details OT goals, HEP, discharge    Person(s) Educated Patient    Methods Explanation;Demonstration    Comprehension Verbalized understanding;Returned demonstration               OT Long Term Goals - 11/30/19 1658      OT LONG TERM GOAL #1   Title Patient will complete an HEP designed to improve range of motion in left shoulder and elbow and grip strength in LUE    Status Achieved      OT LONG TERM GOAL #2   Title Patient will understanding recommendations relating to returning to driving    Status Achieved      OT LONG TERM GOAL #3   Title Patient will demonstrate 5 lb increase in left grip strength to help with opening  food packages or bottles    Status  Achieved      OT LONG TERM GOAL #4   Title Patient will report reduced dizziness with head position changes in the shower, or during bathing and dressing tasks.    Status Achieved                 Plan - 11/30/19 1656    Clinical Impression Statement Patient has continued to progress herself, and is currently independent with ADL/IADL including driving, grocery shopping, etc.  Patient has regained functional grip and movement in LUE and is agreebale to OT discharge    OT Frequency 2x / week    OT Duration 4 weeks    OT Treatment/Interventions Self-care/ADL training;Therapeutic exercise;Visual/perceptual remediation/compensation;Patient/family education;Neuromuscular education;Moist Heat;Functional Furniture conservator/restorer;Therapeutic activities;Balance training;DME and/or AE instruction;Manual Therapy;Passive range of motion    Plan discharge    OT Home Exercise Plan see patient instruction    Consulted and Agree with Plan of Care Patient           Patient will benefit from skilled therapeutic intervention in order to improve the following deficits and impairments:           Visit Diagnosis: Unsteadiness on feet  Hemiplegia and hemiparesis following cerebral infarction affecting left non-dominant side (HCC)  Muscle weakness (generalized)  Visuospatial deficit  Other lack of coordination    Problem List Patient Active Problem List   Diagnosis Date Noted  . Labile blood pressure   . Sinus tachycardia   . ESRD on dialysis ([place])   . 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 Leukocytosis   . Left-sided weakness   . Acute ischemic stroke ([place]) 09/19/2019  . Stroke ([place]) 09/18/2019  . Intractable nausea and vomiting 04/20/2019  . Atypical chest pain 04/02/2019  . Type 2 diabetes mellitus without complications ([place]) 42/68/3419  . Anemia in chronic kidney disease 02/19/2019  .  ESRD (end stage renal disease) ([place]) 02/19/2019  . Secondary hyperparathyroidism of renal origin ([place]) 02/19/2019  . Coagulation defect, unspecified ([place]) 02/19/2019  . Venous (peripheral) insufficiency 08/06/2017  . Encounter for screening for respiratory tuberculosis 01/01/2016  . Hypertension   . 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 Diabetic gastroparesis ([place]) 08/09/2015  . Low back pain 06/09/2014  . Nausea and vomiting 01/18/2012  .  Diarrhea, unspecified 01/18/2012  . Obstructive sleep apnea of adult 01/16/2012  . GERD (gastroesophageal reflux disease) 01/16/2012  . Osteoarthritis of both knees 01/16/2012  . Hyperlipidemia, unspecified 07/18/2011  . LEIOMYOMA, UTERUS 01/14/2007  . Morbid obesity ([place]) 07/03/2006  . Former smoker 07/03/2006  . Tension headache 07/03/2006  OCCUPATIONAL THERAPY DISCHARGE SUMMARY  Visits from Start of Care: 2  Current functional level related to goals / functional outcomes: Independent with ADL/IADL   Remaining deficits: Dizziness with head movement   Education / Equipment: HEP Plan: Patient agrees to discharge.  Patient goals were met. Patient is being discharged due to meeting the stated rehab goals.  ?????     [name], OTR/L 11/30/2019, 4:58 PM  [facility] [address] [place], [place], [address] Phone: [phone]   Fax:  [phone]  Name: [name] MRN: [id number] Date of Birth: Jul 08, 1960

## 2019-12-02 ENCOUNTER — Ambulatory Visit: Payer: BC Managed Care – PPO

## 2019-12-02 ENCOUNTER — Ambulatory Visit: Payer: BC Managed Care – PPO | Admitting: Occupational Therapy

## 2019-12-02 ENCOUNTER — Other Ambulatory Visit: Payer: Self-pay

## 2019-12-02 DIAGNOSIS — R2681 Unsteadiness on feet: Secondary | ICD-10-CM

## 2019-12-02 DIAGNOSIS — I69354 Hemiplegia and hemiparesis following cerebral infarction affecting left non-dominant side: Secondary | ICD-10-CM

## 2019-12-02 DIAGNOSIS — M6281 Muscle weakness (generalized): Secondary | ICD-10-CM

## 2019-12-02 NOTE — Therapy (Signed)
[facility] [address] [place], [place], [address] Phone: [phone]   Fax:  [phone]  Physical Therapy Treatment/ Discharge Summary  Patient Details  Name: [name] MRN: [id number] Date of Birth: 22-Jun-1960 Referring Provider (PT): Jethro Bolus, PA-C  PHYSICAL THERAPY DISCHARGE SUMMARY  Visits from Start of Care: 5  Current functional level related to goals / functional outcomes: See Clinical Impression Statement for Details   Remaining deficits: Decreased Endurance   Education / Equipment: Educated on [employer] and walking program  Plan: Patient agrees to discharge.  Patient goals were partially met. Patient is being discharged due to meeting the stated rehab goals.  ?????        Encounter Date: 12/02/2019   PT End of Session - 12/02/19 1601    Visit Number 5    Number of Visits 16    Date for PT Re-Evaluation 12/14/19    Authorization Type BCBS Medicaid    PT Start Time 1530    PT Stop Time 1601    PT Time Calculation (min) 31 min    Equipment Utilized During Treatment Gait belt    Activity Tolerance Patient tolerated treatment well    Behavior During Therapy WFL for tasks assessed/performed           Past Medical History:  Diagnosis Date  . Accelerated hypertension   . Aortic atherosclerosis ([place])   . Arthritis of knee    bilateral  . Diabetes mellitus    type 2 - no meds  . Diabetic gastroparesis ([place])   . Diverticulosis   . ESRD (end stage renal disease) ([place])   . Fibroid uterus   . GERD (gastroesophageal reflux disease)   . Hyperlipidemia   . Hypertension   . HYPERTENSION, BENIGN SYSTEMIC 07/03/2006        . Hypertensive emergency 02/05/2019  . Hypokalemia   . IDA (iron deficiency anemia)   . Nausea   . Renal disorder   . Umbilical hernia   . Wears dentures     Past Surgical History:  Procedure Laterality Date  . AV FISTULA PLACEMENT Left 02/17/2019   Procedure:  ARTERIOVENOUS (AV) FISTULA CREATION LEFT UPPER ARM;  Surgeon: Waynetta Sandy, MD;  Location: [facility];  Service: Vascular;  Laterality: Left;  . CATARACT EXTRACTION     right eye  . CESAREAN SECTION     x2  . CHOLECYSTECTOMY     laparoscopic  . FISTULA SUPERFICIALIZATION Left 04/06/2019   Procedure: FISTULA SUPERFICIALIZATION LEFT BRACHIOCEPHALIC;  Surgeon: Waynetta Sandy, MD;  Location: [employer];  Service: Vascular;  Laterality: Left;  Transposition left arm brachiocephalic fistula.  . IR FLUORO GUIDE CV LINE RIGHT  02/09/2019  . IR FLUORO GUIDE CV LINE RIGHT  03/05/2019  . IR US GUIDE VASC ACCESS RIGHT  02/09/2019  . MULTIPLE TOOTH EXTRACTIONS    . REDUCTION MAMMAPLASTY Bilateral     There were no vitals filed for this visit.   Subjective Assessment - 12/02/19 1535    Subjective Patient reports since last visit no more instances of nausea/vomiting. No new complaints, no falls or pain to report.    Pertinent History diabetes mellitus, hypertension, end-stage renal disease with hemodialysis    Limitations Lifting;Walking;Standing;House hold activities    How long can you sit comfortably? no issues    How long can you stand comfortably? 5 min    How long can you walk comfortably? 5-10 min    Patient Stated Goals get back  to normal    Currently in Pain? No/denies                             [facility] Adult PT Treatment/Exercise - 12/02/19 0001      Transfers   Transfers Sit to Stand;Stand to Sit    Sit to Stand 6: Modified independent (Device/Increase time)    Stand to Sit 6: Modified independent (Device/Increase time)    Comments completed 5x sit <> stands without UE support and controlled descent      Ambulation/Gait   Ambulation/Gait Yes    Ambulation/Gait Assistance 6: Modified independent (Device/Increase time)    Ambulation Distance (Feet) 1100 Feet    Assistive device None    Gait Pattern Trunk flexed;Wide base of support    Ambulation  Surface Level;Indoor    Stairs Yes    Stairs Assistance 6: Modified independent (Device/Increase time)    Stair Management Technique One rail Right;Alternating pattern;Forwards    Number of Stairs 16    Height of Stairs 6    Gait Comments Patient reports able to ambulate up/down stairs at home with carrying 10-15# with no difficulties.       Standardized Balance Assessment   Standardized Balance Assessment Berg Balance Test      Berg Balance Test   Sit to Stand Able to stand without using hands and stabilize independently    Standing Unsupported Able to stand safely 2 minutes    Sitting with Back Unsupported but Feet Supported on Floor or Stool Able to sit safely and securely 2 minutes    Stand to Sit Sits safely with minimal use of hands    Transfers Able to transfer safely, minor use of hands    Standing Unsupported with Eyes Closed Able to stand 10 seconds safely    Standing Ubsupported with Feet Together Able to place feet together independently and stand 1 minute safely    From Standing, Reach Forward with Outstretched Arm Can reach confidently >25 cm (10")    From Standing Position, Pick up Object from Floor Able to pick up shoe safely and easily    From Standing Position, Turn to Look Behind Over each Shoulder Looks behind from both sides and weight shifts well    Turn 360 Degrees Able to turn 360 degrees safely in 4 seconds or less    Standing Unsupported, Alternately Place Feet on Step/Stool Able to stand independently and safely and complete 8 steps in 20 seconds    Standing Unsupported, One Foot in [place] to place foot tandem independently and hold 30 seconds    Standing on One Leg Able to lift leg independently and hold 5-10 seconds    Total Score 55      Exercises   Exercises Other Exercises    Other Exercises  Reviewed current HEP and educated on contiunance of completing HEP/Walking program to maintain gains achieved with PT services.                   PT  Education - 12/02/19 1601    Education Details Educated on progress toward all goals; HEP review; continuance of walking/HEP to maintain gains    Person(s) Educated Patient    Methods Explanation    Comprehension Verbalized understanding            PT Short Term Goals - 12/02/19 1537      PT SHORT TERM GOAL #1   Title Patient  will be able to go up and down 15 steps with use of one rail and with step to pattern to improve ability to access her aparitment.    Baseline single handrail on R, 16 stairs and alternating pattern    Time 4    Period Weeks    Status Achieved    Target Date 11/16/19      PT SHORT TERM GOAL #2   Title Pt will be able to perform  sit to stand and stand to sit with proper eccentric control without use of UE to improve functional strength    Baseline Pt unable to perform sit to stand without UE support, able to complete all without UE and controlled descent    Time 4    Period Weeks    Status Achieved    Target Date 11/16/19      PT SHORT TERM GOAL #3   Title Patient will be able to ambulate without a cane for 300 feet to improve in home ambulation.    Baseline pt reports she walks at home 15 feet without AD with furniture/wall surfing, 1100 ft w/o AD and Mod I    Time 4    Period Weeks    Status Achieved    Target Date 11/16/19             PT Long Term Goals - 12/02/19 1548      PT LONG TERM GOAL #1   Title Pt will score 56/56 on BBS to improve balance and reduce fall risk    Baseline 50/56, 55/56    Time 8    Period Weeks    Status Partially Met      PT LONG TERM GOAL #2   Title Pt will be able to ambulate 1000 feet SBA without AD to improve walking    Baseline 30 feet without AD with furniture surfing, 1100 ft w/o AD and Mod I    Time 8    Period Weeks    Status Achieved      PT LONG TERM GOAL #3   Title Pt will be able to go up and down steps with use of one rail and be able to carry 10-15 lbs groceries in her hand to improve access to  her apartment    Baseline must use bil rails, requires 3 breaks, patient reports able to ascend/descend stairs 10-15 lbs with no issue    Time 8    Period Weeks    Status Achieved                 Plan - 12/02/19 1553    Clinical Impression Statement Today's session included assessmnet of patient's progress toward goals. Patient able to meet all STG and meet 2/3 LTG and partialy meet Berg Balance goal with score of 55/56 during today's session. patient has demonstrated signifcant progress with PT services. Patient reports she has returned to prior level of function and unable to report any more need for PT services at this time. PT reporting readiness for discharge with patient verbalizing agreement to discharge. Reviewed HEP and educated on continuing to complete HEP and walking program to maintain gains achieved with PT services.    Personal Factors and Comorbidities Comorbidity 3+;Time since onset of injury/illness/exacerbation    Comorbidities diabetes mellitus, hypertension, end-stage renal disease with hemodialysis    Examination-Activity Limitations Caring for Others;Carry;Lift;Squat;Stairs;Stand;Transfers    Examination-Participation Restrictions Church;Cleaning;Community Activity;Driving;Laundry;Meal Prep    Stability/Clinical Decision Making Evolving/Moderate complexity  Rehab Potential Good    PT Frequency Other (comment)   1x week for first month, then 1-2x/week for 8 weeks.   PT Duration 8 weeks    PT Treatment/Interventions ADLs/Self Care Home Management;Gait training;Stair training;Therapeutic activities;Therapeutic exercise;Balance training;Neuromuscular re-education;Patient/family education;Manual techniques;Passive range of motion;Joint Manipulations    PT Next Visit Plan --    PT Home Exercise Plan walking program (start with 5 min and progress it to 30 min with LRAD)    Consulted and Agree with Plan of Care Patient;Family member/caregiver    Family Member Consulted  Daughter Merchandiser, retail)           Patient will benefit from skilled therapeutic intervention in order to improve the following deficits and impairments:  Abnormal gait, Decreased activity tolerance, Decreased balance, Decreased endurance, Decreased mobility, Decreased strength, Difficulty walking, Decreased safety awareness, Postural dysfunction  Visit Diagnosis: Unsteadiness on feet  Hemiplegia and hemiparesis following cerebral infarction affecting left non-dominant side (HCC)  Muscle weakness (generalized)     Problem List Patient Active Problem List   Diagnosis Date Noted  . Labile blood pressure   . Sinus tachycardia   . ESRD on dialysis ([place])   . 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[place])   . Leukocytosis   . Left-sided weakness   . Acute ischemic stroke ([place]) 09/19/2019  . Stroke ([place]) 09/18/2019  . Intractable nausea and vomiting 04/20/2019  . Atypical chest pain 04/02/2019  . Type 2 diabetes mellitus without complications ([place]) 70/34/0352  . Anemia in chronic kidney disease 02/19/2019  . ESRD (end stage renal disease) ([place]) 02/19/2019  . Secondary hyperparathyroidism of renal origin ([place]) 02/19/2019  . Coagulation defect, unspecified ([place]) 02/19/2019  . Venous (peripheral) insufficiency 08/06/2017  . Encounter for screening for respiratory tuberculosis 01/01/2016  . Hypertension   . 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 Diabetic gastroparesis ([place]) 08/09/2015  . Low back pain 06/09/2014  . Nausea and vomiting 01/18/2012  . Diarrhea, unspecified 01/18/2012  . Obstructive sleep apnea of adult 01/16/2012  . GERD (gastroesophageal reflux disease) 01/16/2012  . Osteoarthritis of both knees 01/16/2012  . Hyperlipidemia, unspecified 07/18/2011  . LEIOMYOMA, UTERUS 01/14/2007  . Morbid obesity ([place]) 07/03/2006  . Former smoker 07/03/2006  . Tension headache  07/03/2006    Jones Bales, PT, DPT 12/02/2019, 4:04 PM  [facility] [address] [place], [place], [address] Phone: [phone]   Fax:  [phone]  Name: MARIXA MELLOTT MRN: [id number] Date of Birth: Feb 06, 1961

## 2019-12-07 ENCOUNTER — Encounter: Payer: BC Managed Care – PPO | Admitting: Occupational Therapy

## 2019-12-07 ENCOUNTER — Ambulatory Visit: Payer: BC Managed Care – PPO

## 2019-12-09 ENCOUNTER — Ambulatory Visit: Payer: BC Managed Care – PPO

## 2019-12-09 ENCOUNTER — Encounter: Payer: BC Managed Care – PPO | Admitting: Occupational Therapy

## 2019-12-14 ENCOUNTER — Telehealth: Payer: Self-pay | Admitting: *Deleted

## 2019-12-14 ENCOUNTER — Encounter: Payer: Self-pay | Admitting: Neurology

## 2019-12-14 ENCOUNTER — Inpatient Hospital Stay: Payer: BC Managed Care – PPO | Admitting: Neurology

## 2019-12-14 ENCOUNTER — Encounter: Payer: BC Managed Care – PPO | Admitting: Occupational Therapy

## 2019-12-14 NOTE — Telephone Encounter (Signed)
Patient was no show for new patient appointment today, hospital FU.

## 2019-12-16 ENCOUNTER — Encounter: Payer: BC Managed Care – PPO | Admitting: Occupational Therapy

## 2019-12-30 ENCOUNTER — Other Ambulatory Visit: Payer: Self-pay

## 2019-12-30 ENCOUNTER — Encounter (INDEPENDENT_AMBULATORY_CARE_PROVIDER_SITE_OTHER): Payer: BC Managed Care – PPO | Admitting: Ophthalmology

## 2019-12-30 DIAGNOSIS — E11319 Type 2 diabetes mellitus with unspecified diabetic retinopathy without macular edema: Secondary | ICD-10-CM

## 2019-12-30 DIAGNOSIS — H35033 Hypertensive retinopathy, bilateral: Secondary | ICD-10-CM

## 2019-12-30 DIAGNOSIS — E113393 Type 2 diabetes mellitus with moderate nonproliferative diabetic retinopathy without macular edema, bilateral: Secondary | ICD-10-CM | POA: Diagnosis not present

## 2019-12-30 DIAGNOSIS — I10 Essential (primary) hypertension: Secondary | ICD-10-CM

## 2019-12-30 DIAGNOSIS — H43813 Vitreous degeneration, bilateral: Secondary | ICD-10-CM

## 2019-12-30 DIAGNOSIS — H2512 Age-related nuclear cataract, left eye: Secondary | ICD-10-CM

## 2020-01-25 ENCOUNTER — Telehealth: Payer: Self-pay

## 2020-01-25 NOTE — Telephone Encounter (Signed)
Received notification from [employer] that Pt has requested a cancellation of monitoring service.  Order cancelled. 

## 2020-07-06 ENCOUNTER — Encounter (INDEPENDENT_AMBULATORY_CARE_PROVIDER_SITE_OTHER): Payer: Medicare Other | Admitting: Ophthalmology

## 2020-07-06 ENCOUNTER — Other Ambulatory Visit: Payer: Self-pay

## 2020-07-06 DIAGNOSIS — H35033 Hypertensive retinopathy, bilateral: Secondary | ICD-10-CM | POA: Diagnosis not present

## 2020-07-06 DIAGNOSIS — I10 Essential (primary) hypertension: Secondary | ICD-10-CM

## 2020-07-06 DIAGNOSIS — E113391 Type 2 diabetes mellitus with moderate nonproliferative diabetic retinopathy without macular edema, right eye: Secondary | ICD-10-CM | POA: Diagnosis not present

## 2020-07-06 DIAGNOSIS — E113592 Type 2 diabetes mellitus with proliferative diabetic retinopathy without macular edema, left eye: Secondary | ICD-10-CM | POA: Diagnosis not present

## 2020-07-06 DIAGNOSIS — H43813 Vitreous degeneration, bilateral: Secondary | ICD-10-CM

## 2020-07-20 ENCOUNTER — Other Ambulatory Visit: Payer: Self-pay

## 2020-07-20 ENCOUNTER — Encounter (INDEPENDENT_AMBULATORY_CARE_PROVIDER_SITE_OTHER): Payer: Medicare Other | Admitting: Ophthalmology

## 2020-07-20 DIAGNOSIS — E113592 Type 2 diabetes mellitus with proliferative diabetic retinopathy without macular edema, left eye: Secondary | ICD-10-CM | POA: Diagnosis not present

## 2020-07-22 ENCOUNTER — Other Ambulatory Visit: Payer: Self-pay | Admitting: Internal Medicine

## 2020-08-04 ENCOUNTER — Other Ambulatory Visit: Payer: Self-pay | Admitting: Internal Medicine

## 2020-08-20 NOTE — Progress Notes (Signed)
Patient did not show for appointment.   

## 2020-08-21 ENCOUNTER — Encounter: Payer: Medicare Other | Admitting: Family

## 2020-08-21 DIAGNOSIS — Z7689 Persons encountering health services in other specified circumstances: Secondary | ICD-10-CM

## 2020-10-19 ENCOUNTER — Ambulatory Visit
Admission: RE | Admit: 2020-10-19 | Discharge: 2020-10-19 | Disposition: A | Discharge status: Home / Self Care | Payer: Medicare Other | Source: Ambulatory Visit | Attending: Psychiatry | Admitting: Psychiatry

## 2020-10-19 ENCOUNTER — Other Ambulatory Visit: Payer: Self-pay

## 2020-10-19 ENCOUNTER — Ambulatory Visit (INDEPENDENT_AMBULATORY_CARE_PROVIDER_SITE_OTHER): Payer: Medicare Other | Admitting: Family Medicine

## 2020-10-19 ENCOUNTER — Other Ambulatory Visit: Payer: Self-pay | Admitting: Family Medicine

## 2020-10-19 ENCOUNTER — Other Ambulatory Visit (HOSPITAL_COMMUNITY)
Admission: RE | Admit: 2020-10-19 | Discharge: 2020-10-19 | Disposition: A | Discharge status: Home / Self Care | Payer: Medicare Other | Source: Ambulatory Visit | Attending: Family Medicine | Admitting: Family Medicine

## 2020-10-19 VITALS — BP 122/60 | HR 106 | Ht 67.0 in | Wt 238.4 lb

## 2020-10-19 DIAGNOSIS — Z124 Encounter for screening for malignant neoplasm of cervix: Secondary | ICD-10-CM

## 2020-10-19 DIAGNOSIS — Z1231 Encounter for screening mammogram for malignant neoplasm of breast: Secondary | ICD-10-CM

## 2020-10-19 DIAGNOSIS — Z1151 Encounter for screening for human papillomavirus (HPV): Secondary | ICD-10-CM | POA: Insufficient documentation

## 2020-10-19 DIAGNOSIS — Z01419 Encounter for gynecological examination (general) (routine) without abnormal findings: Secondary | ICD-10-CM | POA: Insufficient documentation

## 2020-10-19 IMAGING — MG MM DIGITAL SCREENING BILAT W/ TOMO AND CAD
6 of 10 series · 6 of 30 positions shown · non-contrast
Comparison: Previous exam(s).

ACR Breast Density Category a: The breast tissue is almost entirely
fatty.

CLINICAL DATA: Screening.

EXAM:
DIGITAL SCREENING BILATERAL MAMMOGRAM WITH TOMOSYNTHESIS AND CAD
TECHNIQUE: Bilateral screening digital craniocaudal and mediolateral oblique
mammograms were obtained. Bilateral screening digital breast
tomosynthesis was performed. The images were evaluated with
computer-aided detection.

[L CC synth-2D]
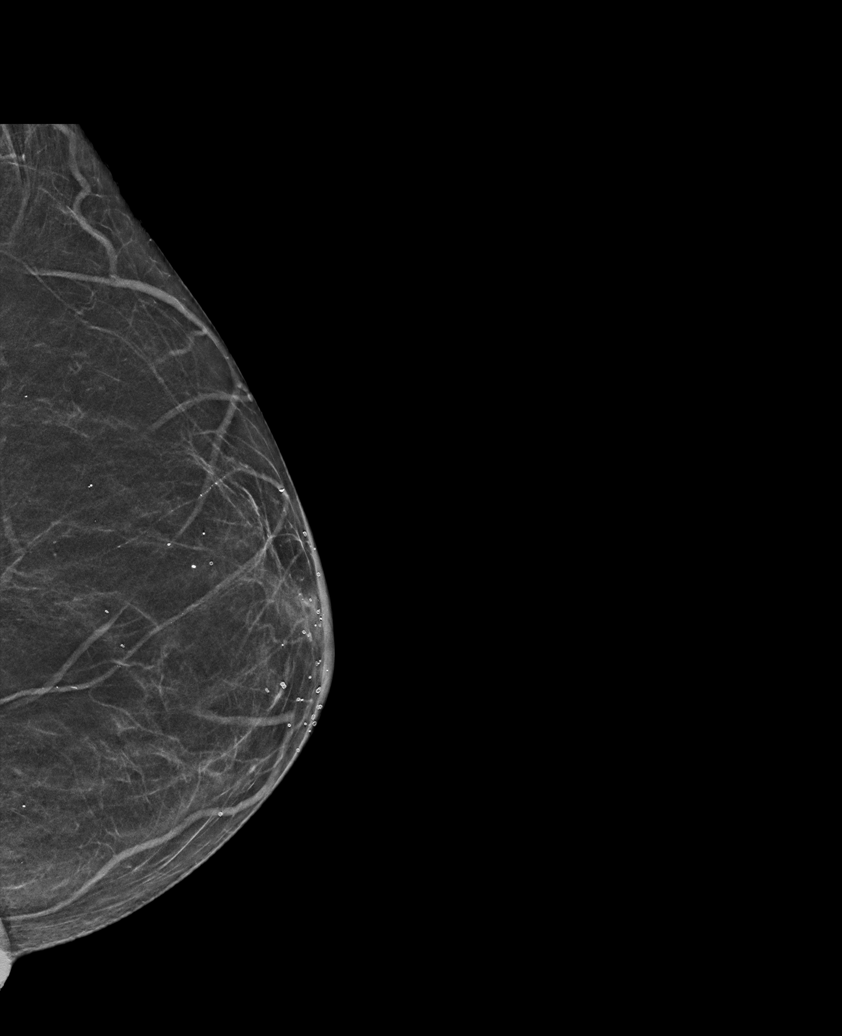

[L MLO synth-2D (1 of 2)]
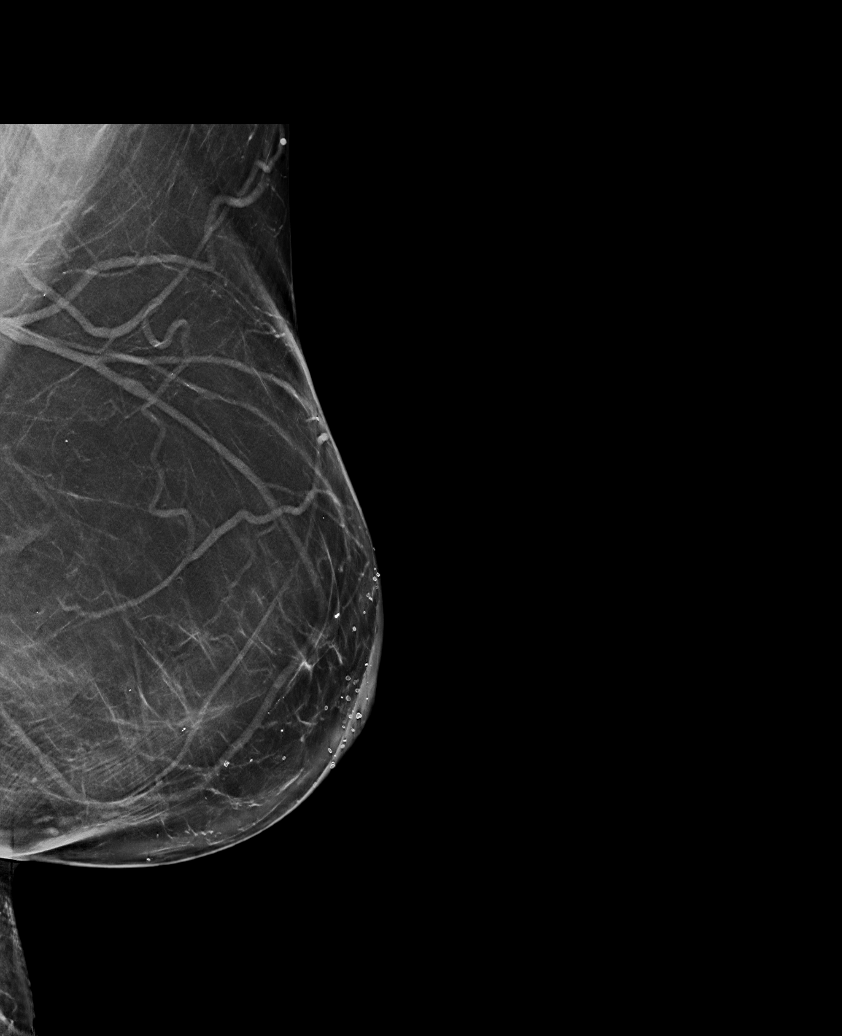

[R CC synth-2D]
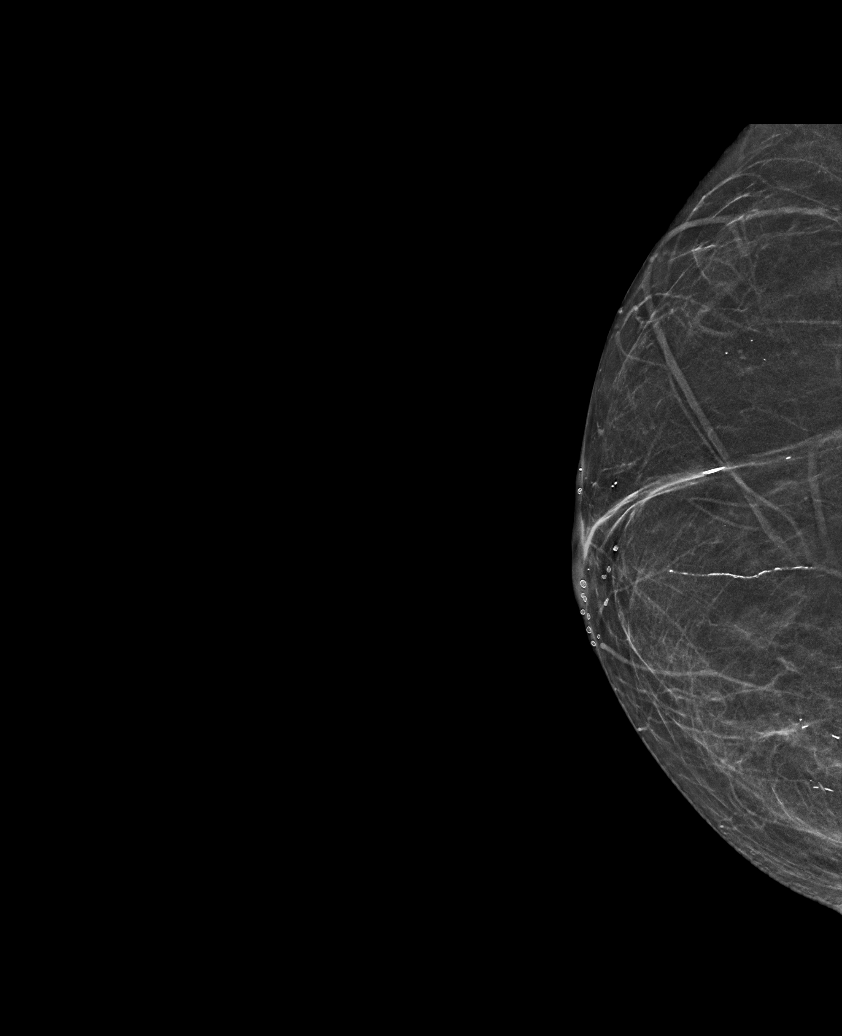

[R MLO synth-2D]
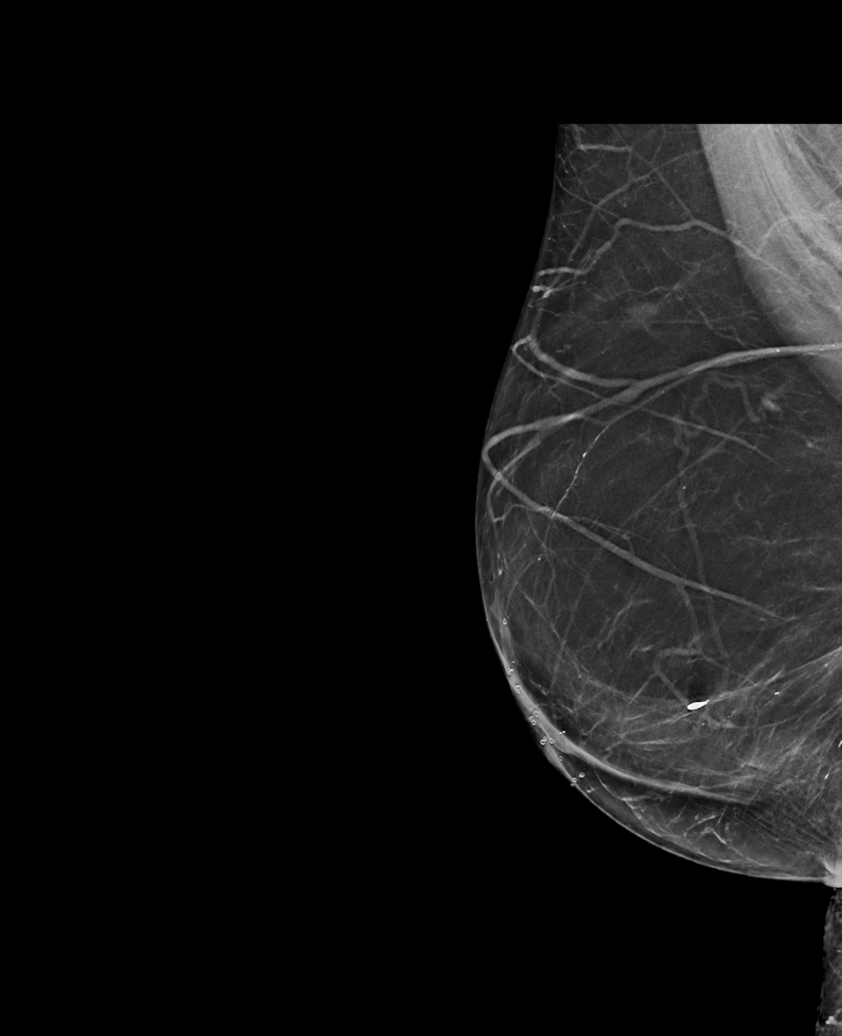

[L MLO synth-2D (2 of 2)]
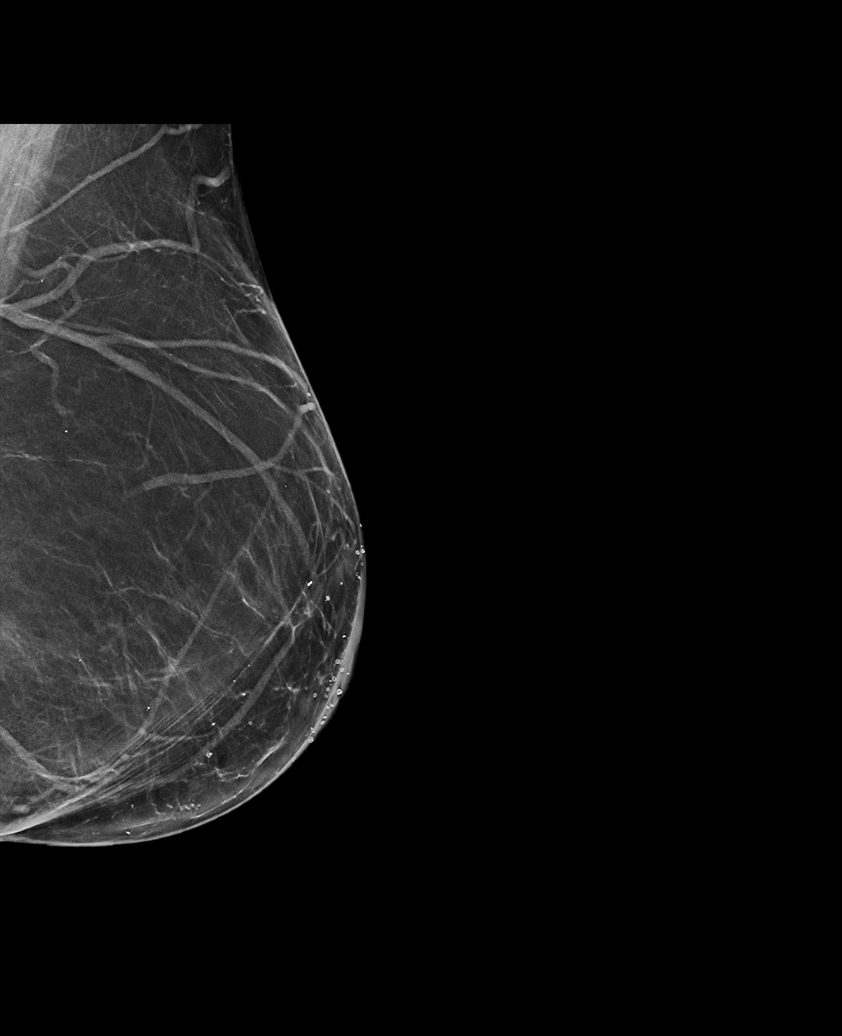

[R MLO tomo · tomo slice 33/66.0]
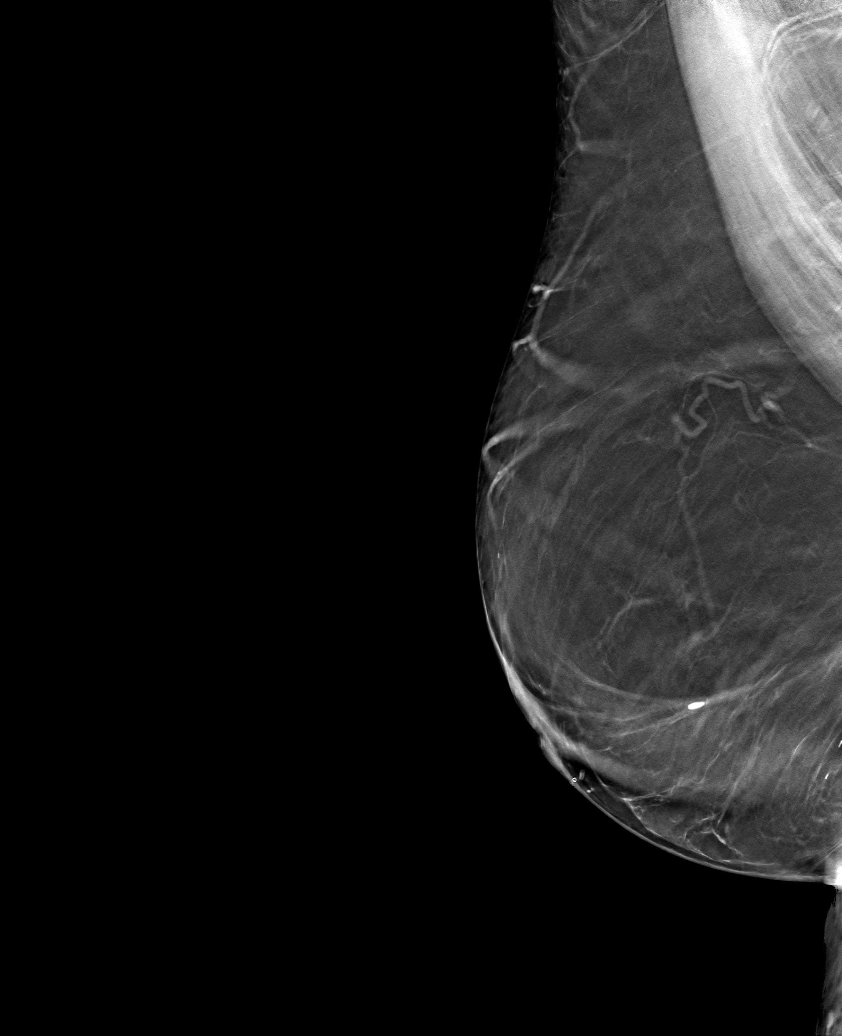

[6 of 30 positions shown; findings below may reference images not displayed]

FINDINGS: In the right breast, a possible asymmetry warrants further
evaluation. In the left breast, no findings suspicious for
malignancy.
IMPRESSION: Further evaluation is suggested for possible asymmetry in the right
breast.

RECOMMENDATION:
Diagnostic mammogram and possibly ultrasound of the right breast.
(Code:[XM])

The patient will be contacted regarding the findings, and additional
imaging will be scheduled.

BI-RADS CATEGORY  0: Incomplete. Need additional imaging evaluation
and/or prior mammograms for comparison.

## 2020-10-19 NOTE — Patient Instructions (Addendum)
It was wonderful to see you today.  Please bring ALL of your medications with you to every visit.   Today we talked about:  We did a Pap smear today. It was difficult to find your cervix today but hopefully the test will be adequate. I will let you know the results via MyChart.   Thank you for choosing [facility].   Please call [phone] with any questions about today's appointment.  Please be sure to schedule follow up at the front  desk before you leave today.   Sharion Settler, DO PGY-1 Family Medicine

## 2020-10-19 NOTE — Progress Notes (Addendum)
    SUBJECTIVE:   CHIEF COMPLAINT / HPI:   Need for Pap Smear  Patient presents today for need for Pap smear. No concern for STI. Denies vaginal discharge. Reports no abnormal Pap smears in the past. Last Pap smear was normal and HPV negative in 2016.  PERTINENT  PMH / PSH:  Past Medical History:  Diagnosis Date   Accelerated hypertension    Aortic atherosclerosis ([place])    Arthritis of knee    bilateral   Diabetes mellitus    type 2 - no meds   Diabetic gastroparesis (HCC)    Diverticulosis    ESRD (end stage renal disease) ([place])    Fibroid uterus    GERD (gastroesophageal reflux disease)    Hyperlipidemia    Hypertension    HYPERTENSION, BENIGN SYSTEMIC 07/03/2006         Hypertensive emergency 02/05/2019   Hypokalemia    IDA (iron deficiency anemia)    Nausea    Renal disorder    Umbilical hernia    Wears dentures      OBJECTIVE:   BP 122/60   Pulse (!) 106   Ht 5\' 7"  (1.702 m)   Wt 238 lb 6.4 oz (108.1 kg)   LMP  (LMP Unknown)   SpO2 98%   BMI 37.34 kg/m    General: NAD, pleasant, able to participate in exam Respiratory: Breathing comfortably on room air, in no respiratory distress Skin: Left AC fistula bandaged, skin warm an dry GU: Exam chaperoned by Venida Jarvis, CMA. Vagina with age appropriate atrophy. Vaginal walls were caving in and unable  to appreciate cervix despite attempts at repositioning speculum and having patient elevate pelvis. White discharge appreciated at vaginal vault. Neuro: alert, no obvious focal deficits Psych: Normal affect and mood  ASSESSMENT/PLAN:   Encounter for Papanicolaou smear of cervix Encounter for screening Pap smear and HPV today. Unfortunately, had difficulty appreciating cervix on exam today despite trying different maneuvers. Hopefully swab is adequate for interpretation. Discussed with patient the possibility of repeat Pap if inadequate.  -F/u results     Sharion Settler, [facility]

## 2020-10-19 NOTE — Assessment & Plan Note (Signed)
Encounter for screening Pap smear and HPV today. Unfortunately, had difficulty appreciating cervix on exam today despite trying different maneuvers. Hopefully swab is adequate for interpretation. Discussed with patient the possibility of repeat Pap if inadequate.  -F/u results

## 2020-10-20 LAB — CYTOLOGY - PAP
Adequacy: ABSENT
Comment: NEGATIVE
Diagnosis: NEGATIVE
High risk HPV: NEGATIVE

## 2020-10-23 ENCOUNTER — Other Ambulatory Visit: Payer: Self-pay | Admitting: Psychiatry

## 2020-10-23 DIAGNOSIS — R928 Other abnormal and inconclusive findings on diagnostic imaging of breast: Secondary | ICD-10-CM

## 2020-10-31 ENCOUNTER — Other Ambulatory Visit: Payer: Self-pay | Admitting: Family Medicine

## 2020-10-31 DIAGNOSIS — R928 Other abnormal and inconclusive findings on diagnostic imaging of breast: Secondary | ICD-10-CM

## 2020-11-16 ENCOUNTER — Other Ambulatory Visit: Payer: Medicare Other

## 2020-11-20 ENCOUNTER — Encounter (INDEPENDENT_AMBULATORY_CARE_PROVIDER_SITE_OTHER): Payer: Medicare Other | Admitting: Ophthalmology

## 2020-11-23 ENCOUNTER — Encounter (INDEPENDENT_AMBULATORY_CARE_PROVIDER_SITE_OTHER): Payer: Medicare Other | Admitting: Ophthalmology

## 2020-12-04 DIAGNOSIS — D689 Coagulation defect, unspecified: Secondary | ICD-10-CM | POA: Diagnosis not present

## 2020-12-04 DIAGNOSIS — Z992 Dependence on renal dialysis: Secondary | ICD-10-CM | POA: Diagnosis not present

## 2020-12-04 DIAGNOSIS — N186 End stage renal disease: Secondary | ICD-10-CM | POA: Diagnosis not present

## 2020-12-04 DIAGNOSIS — E876 Hypokalemia: Secondary | ICD-10-CM | POA: Diagnosis not present

## 2020-12-04 DIAGNOSIS — D631 Anemia in chronic kidney disease: Secondary | ICD-10-CM | POA: Diagnosis not present

## 2020-12-04 DIAGNOSIS — N2581 Secondary hyperparathyroidism of renal origin: Secondary | ICD-10-CM | POA: Diagnosis not present

## 2020-12-05 ENCOUNTER — Ambulatory Visit: Payer: Medicare Other

## 2020-12-05 ENCOUNTER — Other Ambulatory Visit: Payer: Self-pay

## 2020-12-05 ENCOUNTER — Ambulatory Visit
Admission: RE | Admit: 2020-12-05 | Discharge: 2020-12-05 | Disposition: A | Discharge status: Home / Self Care | Payer: Medicare Other | Source: Ambulatory Visit | Attending: Family Medicine | Admitting: Family Medicine

## 2020-12-05 DIAGNOSIS — R928 Other abnormal and inconclusive findings on diagnostic imaging of breast: Secondary | ICD-10-CM | POA: Diagnosis not present

## 2020-12-05 IMAGING — MG MM DIGITAL DIAGNOSTIC UNILAT*R* W/ TOMO W/ CAD
8 series · 9 of 24 positions shown · non-contrast
Comparison: Previous exam(s).

ACR Breast Density Category a: The breast tissue is almost entirely
fatty.

CLINICAL DATA: 59-year-old female presenting as a recall from
screening for possible right breast asymmetry.

EXAM:
DIGITAL DIAGNOSTIC UNILATERAL RIGHT MAMMOGRAM WITH TOMOSYNTHESIS AND
CAD
TECHNIQUE: Right digital diagnostic mammography and breast tomosynthesis was
performed. The images were evaluated with computer-aided detection.

[R MLO synth-2D (1 of 2)]
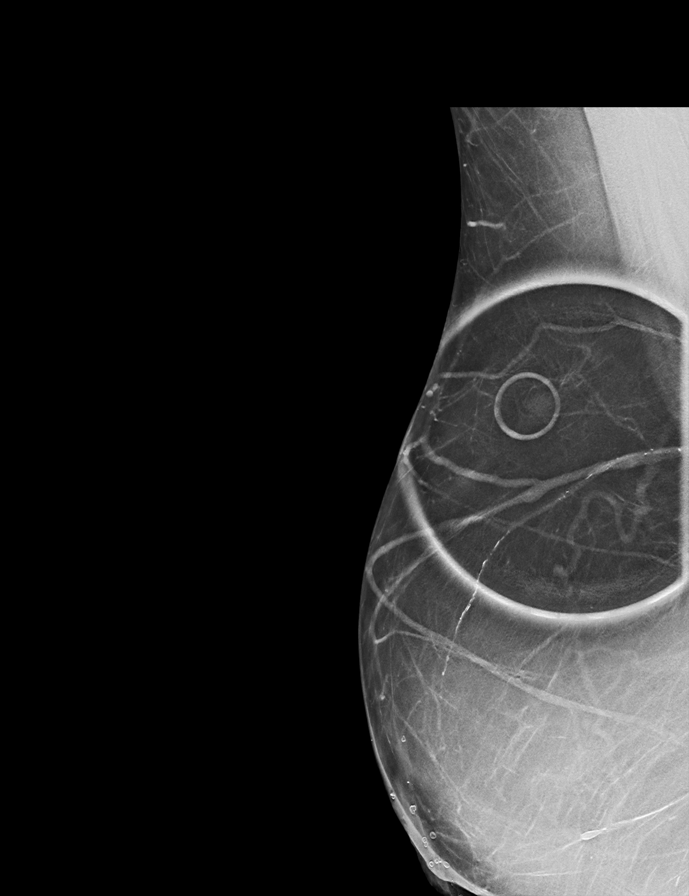

[R CC synth-2D (1 of 2)]
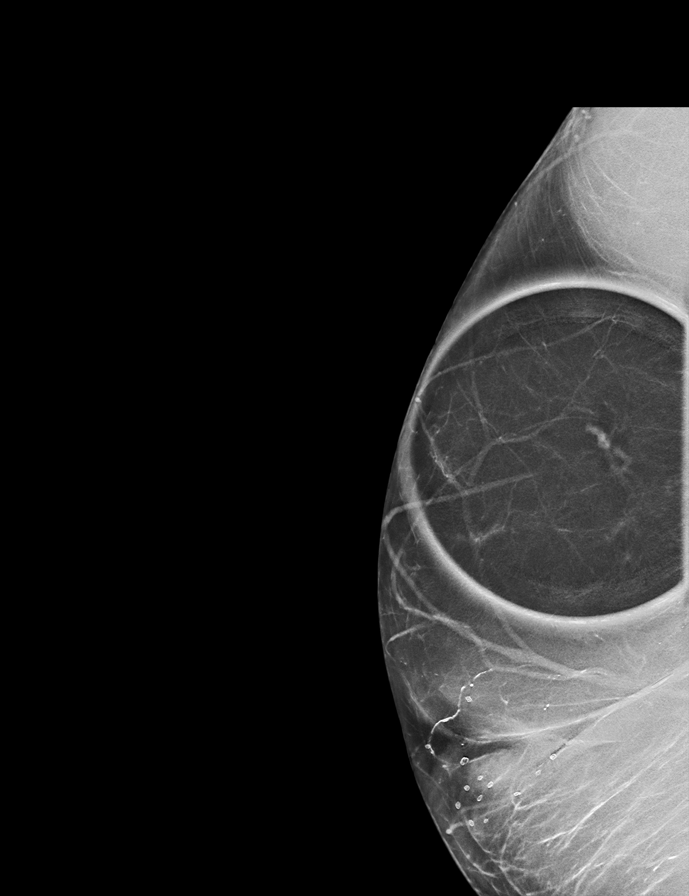

[R MLO synth-2D (2 of 2)]
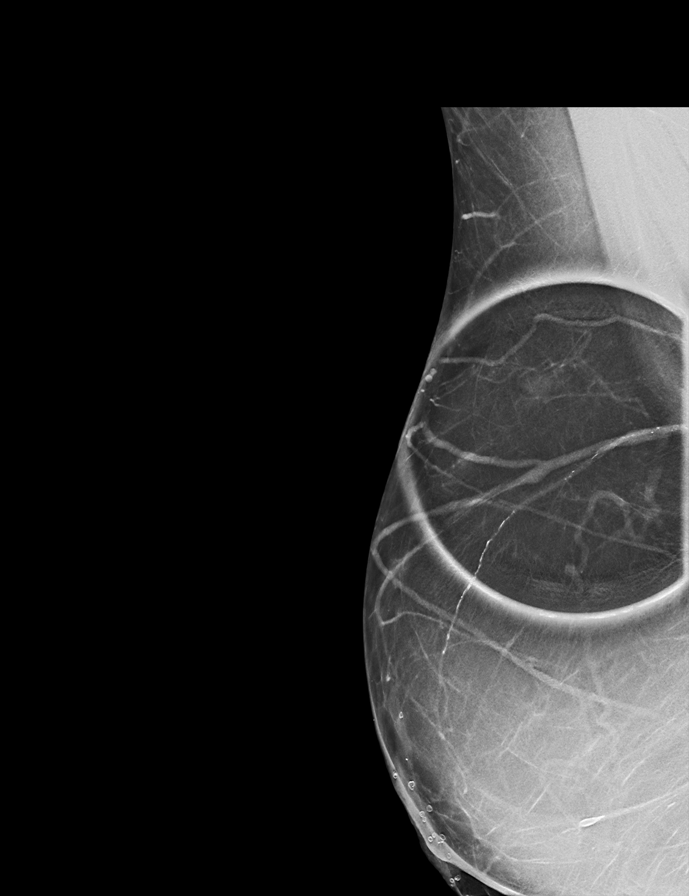

[R CC synth-2D (2 of 2)]
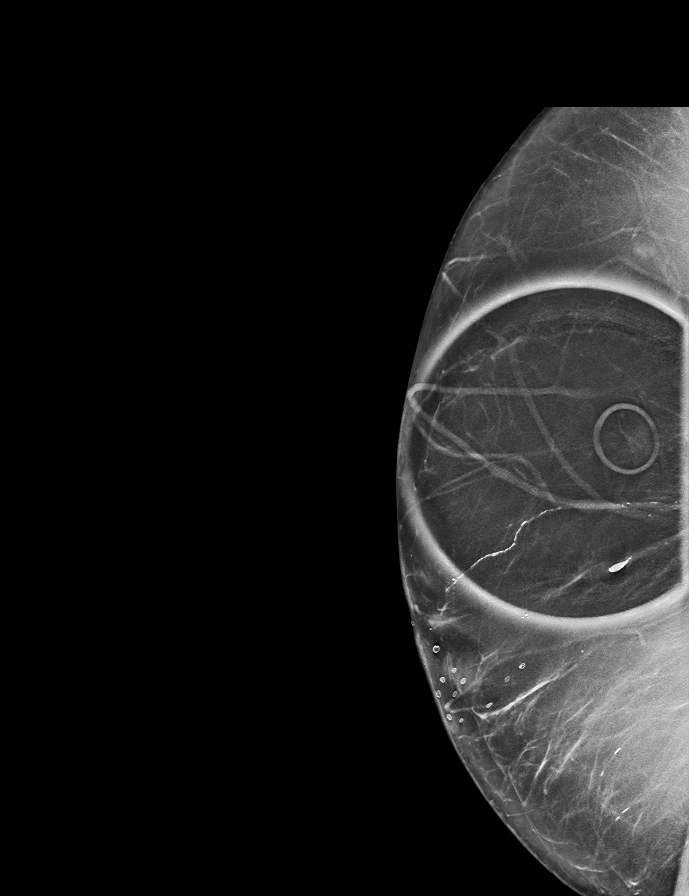

[R CC tomo · 2 of 60 frames shown (1 of 2)]
[frame 20/60]
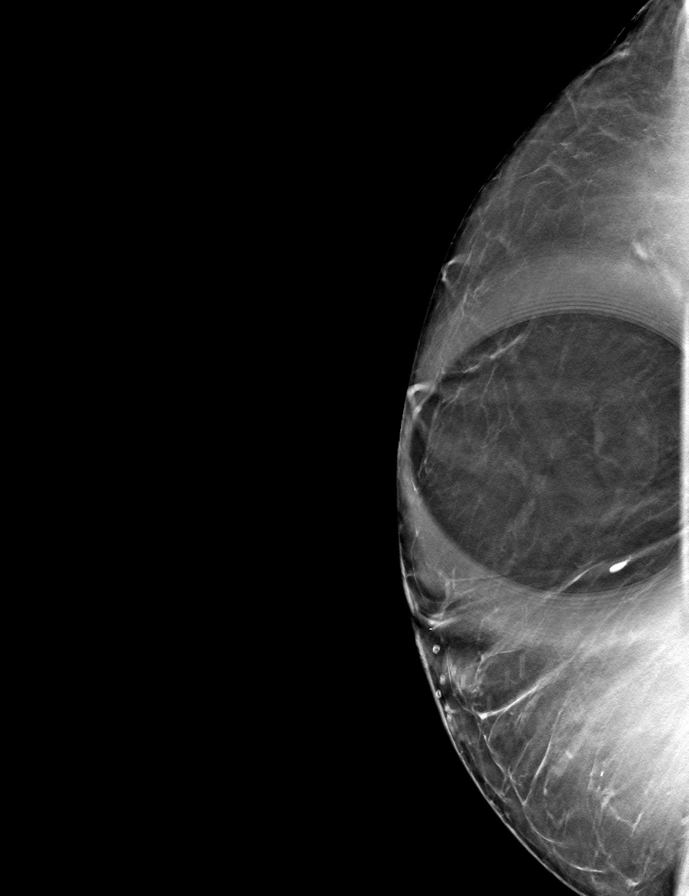
[frame 31/60]
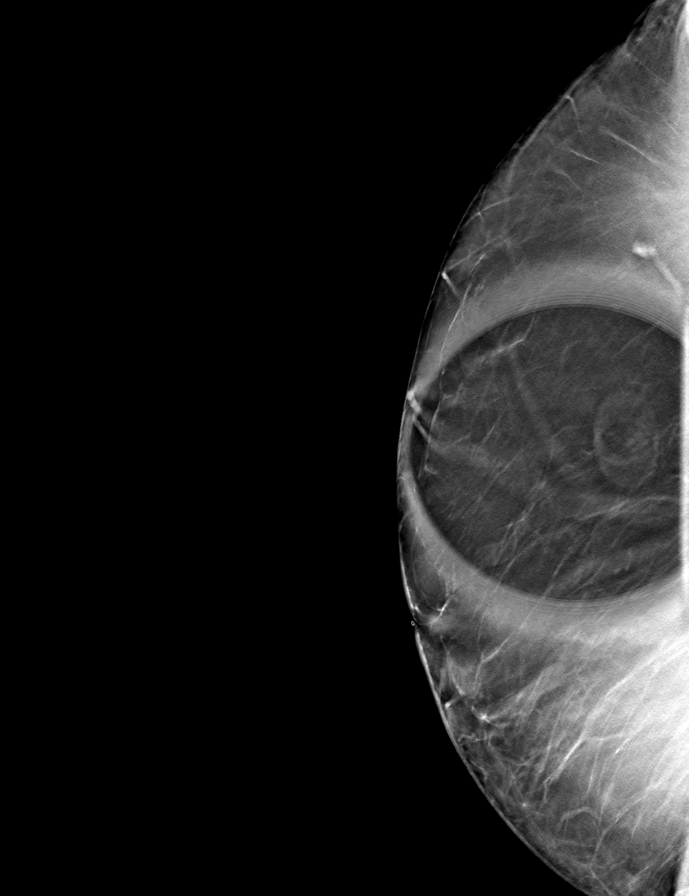

[R CC tomo (2 of 2) · tomo slice 29/58.0]
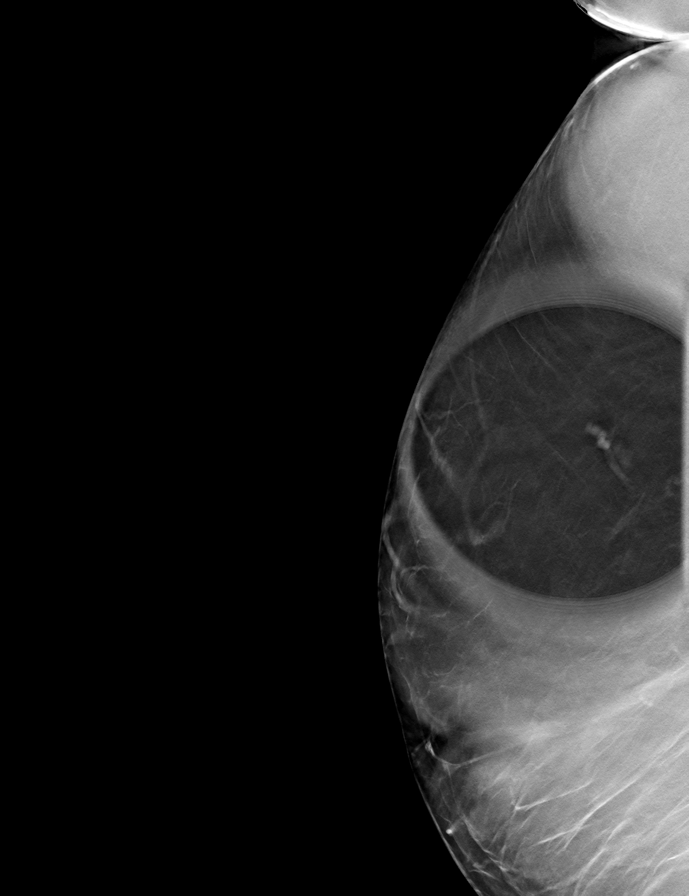

[R MLO tomo (1 of 2) · tomo slice 30/59.0]
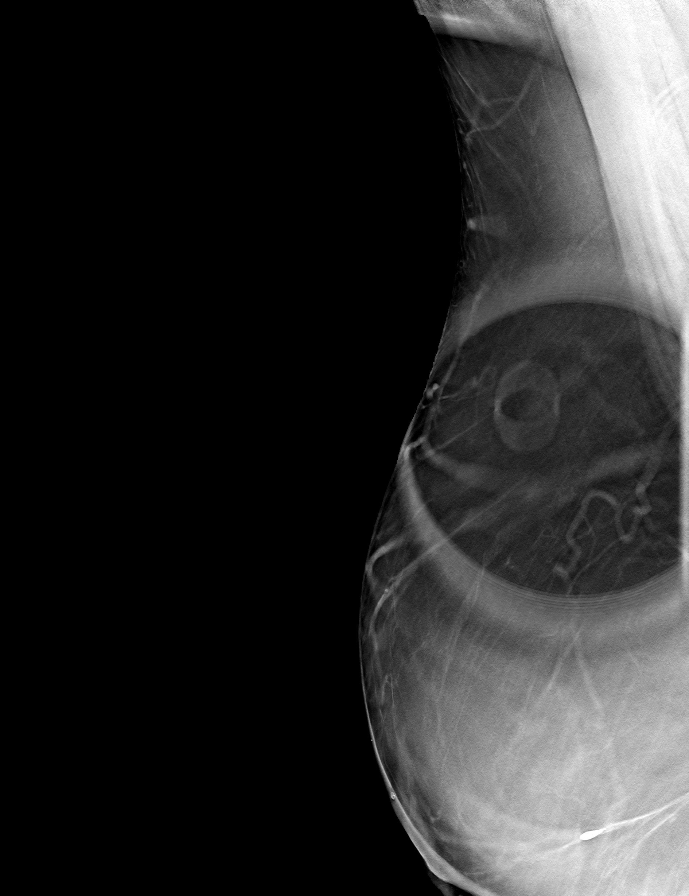

[R MLO tomo (2 of 2) · tomo slice 31/60.0]
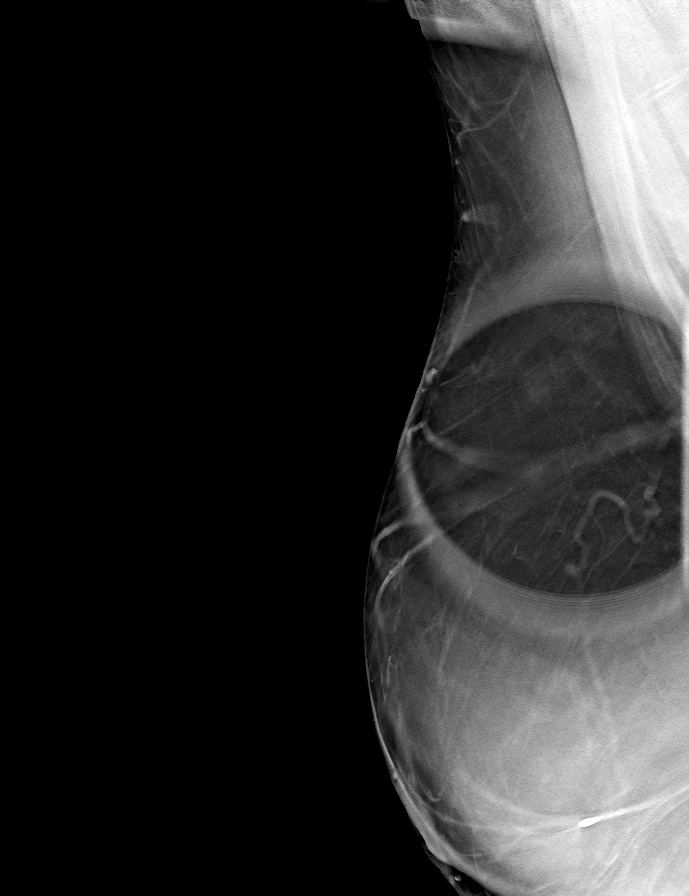

[9 of 24 positions shown; findings below may reference images not displayed]

FINDINGS: Spot compression tomosynthesis views of the right breast were
performed, reimaged with a mole marker at the site of a raised scar
in the upper central chest, which resulted from interval placement
and removal of a dialysis catheter. The asymmetry corresponds to the
scar. There is no additional suspicious finding on today's imaging
in the right breast.
IMPRESSION: The questioned asymmetry corresponds to a raised scar on the
patient's skin from prior dialysis catheter.

RECOMMENDATION:
Screening mammogram in one year.(Code:[KO])

I have discussed the findings and recommendations with the patient.
If applicable, a reminder letter will be sent to the patient
regarding the next appointment.

BI-RADS CATEGORY  2: Benign.

## 2020-12-06 DIAGNOSIS — N2581 Secondary hyperparathyroidism of renal origin: Secondary | ICD-10-CM | POA: Diagnosis not present

## 2020-12-06 DIAGNOSIS — D631 Anemia in chronic kidney disease: Secondary | ICD-10-CM | POA: Diagnosis not present

## 2020-12-06 DIAGNOSIS — E876 Hypokalemia: Secondary | ICD-10-CM | POA: Diagnosis not present

## 2020-12-06 DIAGNOSIS — N186 End stage renal disease: Secondary | ICD-10-CM | POA: Diagnosis not present

## 2020-12-06 DIAGNOSIS — D689 Coagulation defect, unspecified: Secondary | ICD-10-CM | POA: Diagnosis not present

## 2020-12-06 DIAGNOSIS — Z992 Dependence on renal dialysis: Secondary | ICD-10-CM | POA: Diagnosis not present

## 2020-12-08 DIAGNOSIS — D689 Coagulation defect, unspecified: Secondary | ICD-10-CM | POA: Diagnosis not present

## 2020-12-08 DIAGNOSIS — N186 End stage renal disease: Secondary | ICD-10-CM | POA: Diagnosis not present

## 2020-12-08 DIAGNOSIS — D631 Anemia in chronic kidney disease: Secondary | ICD-10-CM | POA: Diagnosis not present

## 2020-12-08 DIAGNOSIS — E876 Hypokalemia: Secondary | ICD-10-CM | POA: Diagnosis not present

## 2020-12-08 DIAGNOSIS — Z992 Dependence on renal dialysis: Secondary | ICD-10-CM | POA: Diagnosis not present

## 2020-12-08 DIAGNOSIS — N2581 Secondary hyperparathyroidism of renal origin: Secondary | ICD-10-CM | POA: Diagnosis not present

## 2020-12-11 DIAGNOSIS — N2581 Secondary hyperparathyroidism of renal origin: Secondary | ICD-10-CM | POA: Diagnosis not present

## 2020-12-11 DIAGNOSIS — N186 End stage renal disease: Secondary | ICD-10-CM | POA: Diagnosis not present

## 2020-12-11 DIAGNOSIS — D689 Coagulation defect, unspecified: Secondary | ICD-10-CM | POA: Diagnosis not present

## 2020-12-11 DIAGNOSIS — Z992 Dependence on renal dialysis: Secondary | ICD-10-CM | POA: Diagnosis not present

## 2020-12-11 DIAGNOSIS — D631 Anemia in chronic kidney disease: Secondary | ICD-10-CM | POA: Diagnosis not present

## 2020-12-11 DIAGNOSIS — E876 Hypokalemia: Secondary | ICD-10-CM | POA: Diagnosis not present

## 2020-12-13 DIAGNOSIS — N2581 Secondary hyperparathyroidism of renal origin: Secondary | ICD-10-CM | POA: Diagnosis not present

## 2020-12-13 DIAGNOSIS — Z992 Dependence on renal dialysis: Secondary | ICD-10-CM | POA: Diagnosis not present

## 2020-12-13 DIAGNOSIS — E876 Hypokalemia: Secondary | ICD-10-CM | POA: Diagnosis not present

## 2020-12-13 DIAGNOSIS — N186 End stage renal disease: Secondary | ICD-10-CM | POA: Diagnosis not present

## 2020-12-13 DIAGNOSIS — D631 Anemia in chronic kidney disease: Secondary | ICD-10-CM | POA: Diagnosis not present

## 2020-12-13 DIAGNOSIS — D689 Coagulation defect, unspecified: Secondary | ICD-10-CM | POA: Diagnosis not present

## 2020-12-15 DIAGNOSIS — E876 Hypokalemia: Secondary | ICD-10-CM | POA: Diagnosis not present

## 2020-12-15 DIAGNOSIS — Z992 Dependence on renal dialysis: Secondary | ICD-10-CM | POA: Diagnosis not present

## 2020-12-15 DIAGNOSIS — D689 Coagulation defect, unspecified: Secondary | ICD-10-CM | POA: Diagnosis not present

## 2020-12-15 DIAGNOSIS — N186 End stage renal disease: Secondary | ICD-10-CM | POA: Diagnosis not present

## 2020-12-15 DIAGNOSIS — N2581 Secondary hyperparathyroidism of renal origin: Secondary | ICD-10-CM | POA: Diagnosis not present

## 2020-12-15 DIAGNOSIS — D631 Anemia in chronic kidney disease: Secondary | ICD-10-CM | POA: Diagnosis not present

## 2020-12-18 ENCOUNTER — Emergency Department (HOSPITAL_COMMUNITY): Payer: Medicare Other

## 2020-12-18 ENCOUNTER — Emergency Department (HOSPITAL_COMMUNITY)
Admission: EM | Admit: 2020-12-18 | Discharge: 2020-12-18 | Disposition: A | Discharge status: Home / Self Care | Payer: Medicare Other | Attending: Student | Admitting: Student

## 2020-12-18 ENCOUNTER — Encounter (HOSPITAL_COMMUNITY): Payer: Self-pay

## 2020-12-18 ENCOUNTER — Other Ambulatory Visit: Payer: Self-pay

## 2020-12-18 DIAGNOSIS — E876 Hypokalemia: Secondary | ICD-10-CM | POA: Diagnosis not present

## 2020-12-18 DIAGNOSIS — R079 Chest pain, unspecified: Secondary | ICD-10-CM

## 2020-12-18 DIAGNOSIS — Z7982 Long term (current) use of aspirin: Secondary | ICD-10-CM | POA: Diagnosis not present

## 2020-12-18 DIAGNOSIS — Z992 Dependence on renal dialysis: Secondary | ICD-10-CM | POA: Insufficient documentation

## 2020-12-18 DIAGNOSIS — I120 Hypertensive chronic kidney disease with stage 5 chronic kidney disease or end stage renal disease: Secondary | ICD-10-CM | POA: Insufficient documentation

## 2020-12-18 DIAGNOSIS — Z87891 Personal history of nicotine dependence: Secondary | ICD-10-CM | POA: Diagnosis not present

## 2020-12-18 DIAGNOSIS — R0789 Other chest pain: Secondary | ICD-10-CM | POA: Diagnosis not present

## 2020-12-18 DIAGNOSIS — R42 Dizziness and giddiness: Secondary | ICD-10-CM | POA: Diagnosis not present

## 2020-12-18 DIAGNOSIS — R110 Nausea: Secondary | ICD-10-CM | POA: Diagnosis not present

## 2020-12-18 DIAGNOSIS — E119 Type 2 diabetes mellitus without complications: Secondary | ICD-10-CM | POA: Diagnosis not present

## 2020-12-18 DIAGNOSIS — R404 Transient alteration of awareness: Secondary | ICD-10-CM | POA: Diagnosis not present

## 2020-12-18 DIAGNOSIS — N186 End stage renal disease: Secondary | ICD-10-CM | POA: Diagnosis not present

## 2020-12-18 DIAGNOSIS — N2581 Secondary hyperparathyroidism of renal origin: Secondary | ICD-10-CM | POA: Diagnosis not present

## 2020-12-18 DIAGNOSIS — D631 Anemia in chronic kidney disease: Secondary | ICD-10-CM | POA: Diagnosis not present

## 2020-12-18 DIAGNOSIS — D689 Coagulation defect, unspecified: Secondary | ICD-10-CM | POA: Diagnosis not present

## 2020-12-18 LAB — COMPREHENSIVE METABOLIC PANEL
ALT: 13 U/L (ref 0–44)
AST: 14 U/L — ABNORMAL LOW (ref 15–41)
Albumin: 3.7 g/dL (ref 3.5–5.0)
Alkaline Phosphatase: 83 U/L (ref 38–126)
Anion gap: 13 (ref 5–15)
BUN: 22 mg/dL (ref 8–23)
CO2: 31 mmol/L (ref 22–32)
Calcium: 9.1 mg/dL (ref 8.9–10.3)
Chloride: 95 mmol/L — ABNORMAL LOW (ref 98–111)
Creatinine, Ser: 4.86 mg/dL — ABNORMAL HIGH (ref 0.44–1.00)
GFR, Estimated: 10 mL/min — ABNORMAL LOW (ref >60)
Glucose, Bld: 83 mg/dL (ref 70–99)
Potassium: 3.5 mmol/L (ref 3.5–5.1)
Sodium: 139 mmol/L (ref 135–145)
Total Bilirubin: 0.4 mg/dL (ref 0.3–1.2)
Total Protein: 8 g/dL (ref 6.5–8.1)

## 2020-12-18 LAB — CBC
HCT: 35.2 % — ABNORMAL LOW (ref 36.0–46.0)
Hemoglobin: 11 g/dL — ABNORMAL LOW (ref 12.0–15.0)
MCH: 27.8 pg (ref 26.0–34.0)
MCHC: 31.3 g/dL (ref 30.0–36.0)
MCV: 89.1 fL (ref 80.0–100.0)
Platelets: 288 10*3/uL (ref 150–400)
RBC: 3.95 MIL/uL (ref 3.87–5.11)
RDW: 14.6 % (ref 11.5–15.5)
WBC: 8.9 10*3/uL (ref 4.0–10.5)
nRBC: 0 % (ref 0.0–0.2)

## 2020-12-18 LAB — TROPONIN I (HIGH SENSITIVITY)
Troponin I (High Sensitivity): 12 ng/L (ref <18)
Troponin I (High Sensitivity): 11 ng/L (ref <18)

## 2020-12-18 IMAGING — CR DG CHEST 2V
2 series · 2 of 2 positions shown · non-contrast
Comparison: Chest radiograph [DATE]

CLINICAL DATA: Chest pain

EXAM:
CHEST - 2 VIEW

[w chest pa]
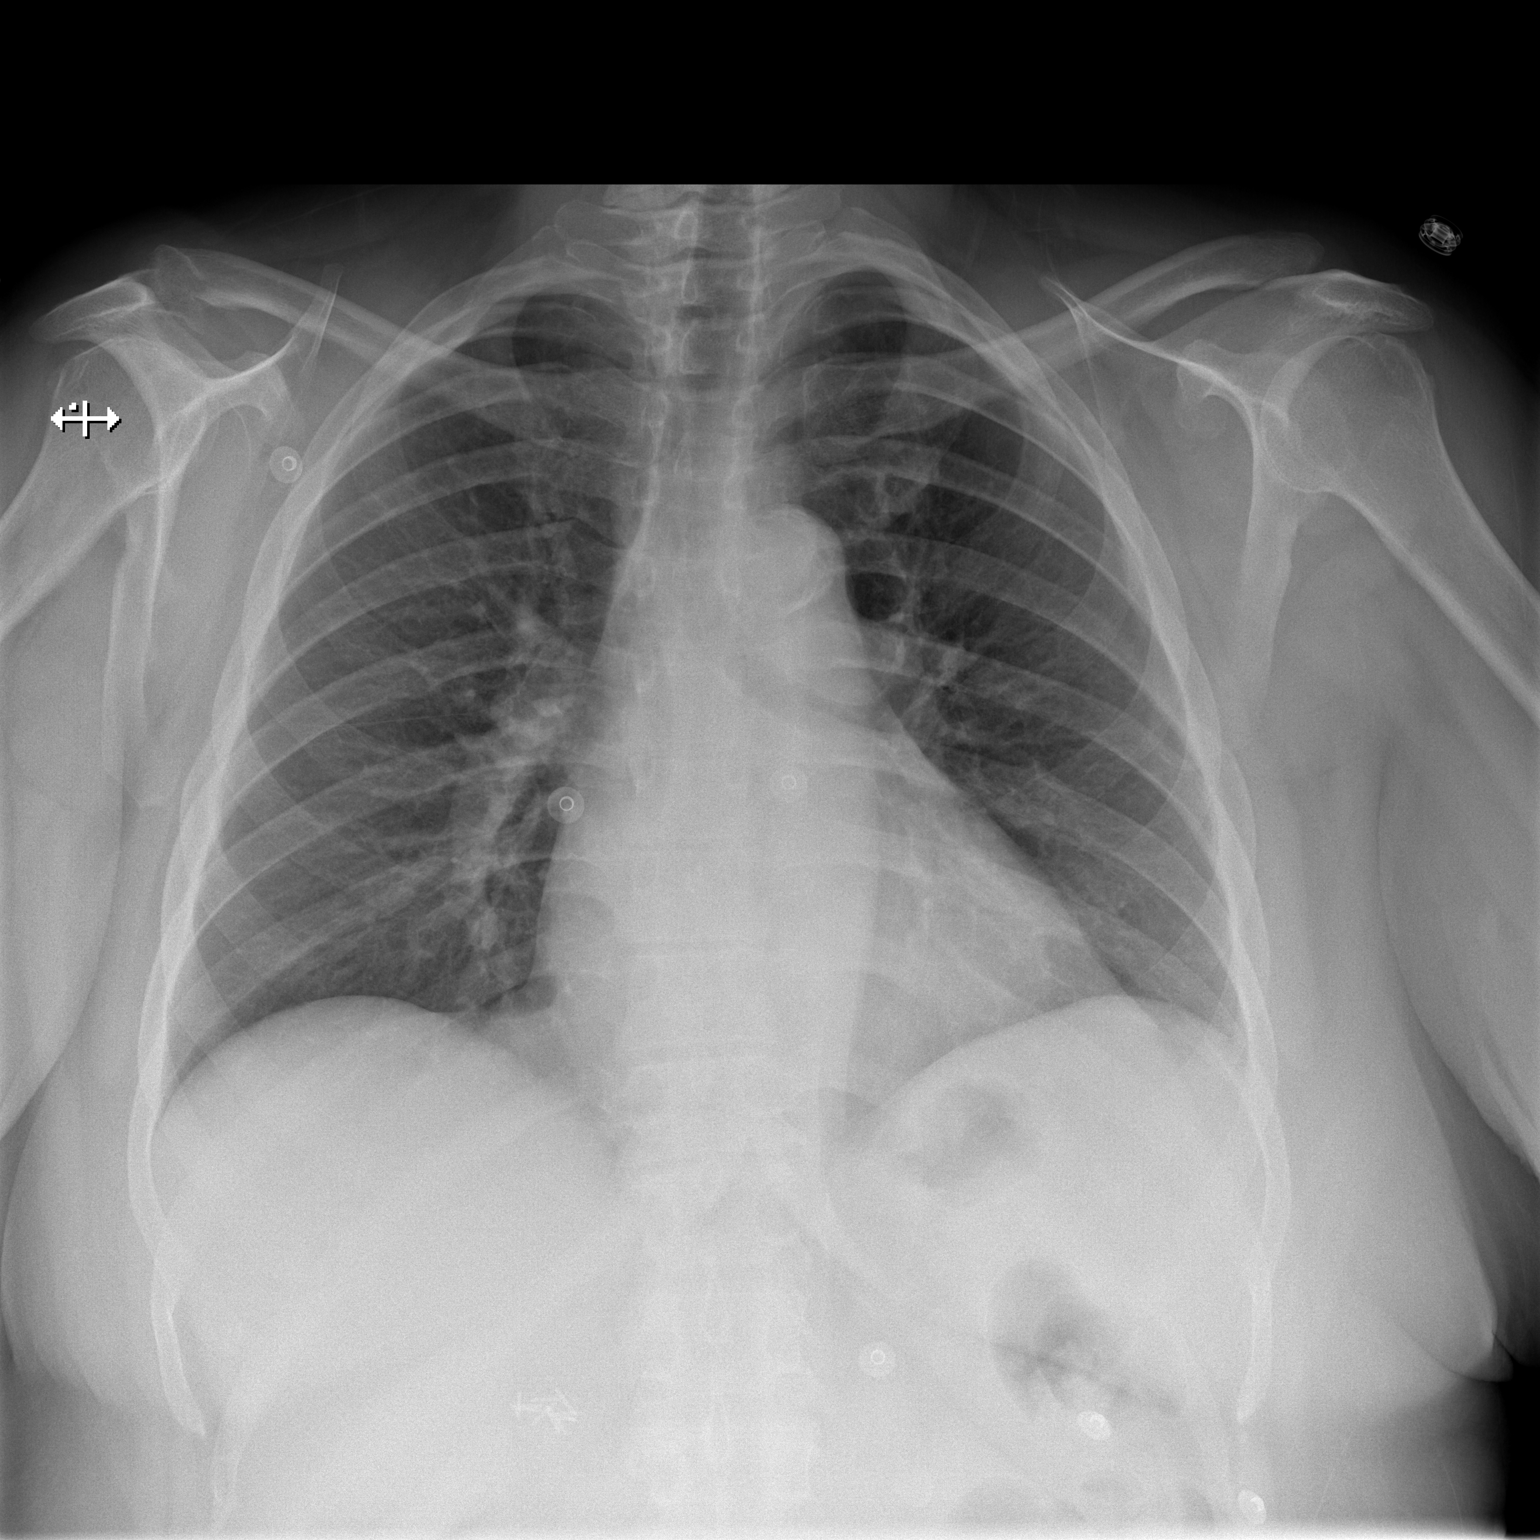

[w chest lat]
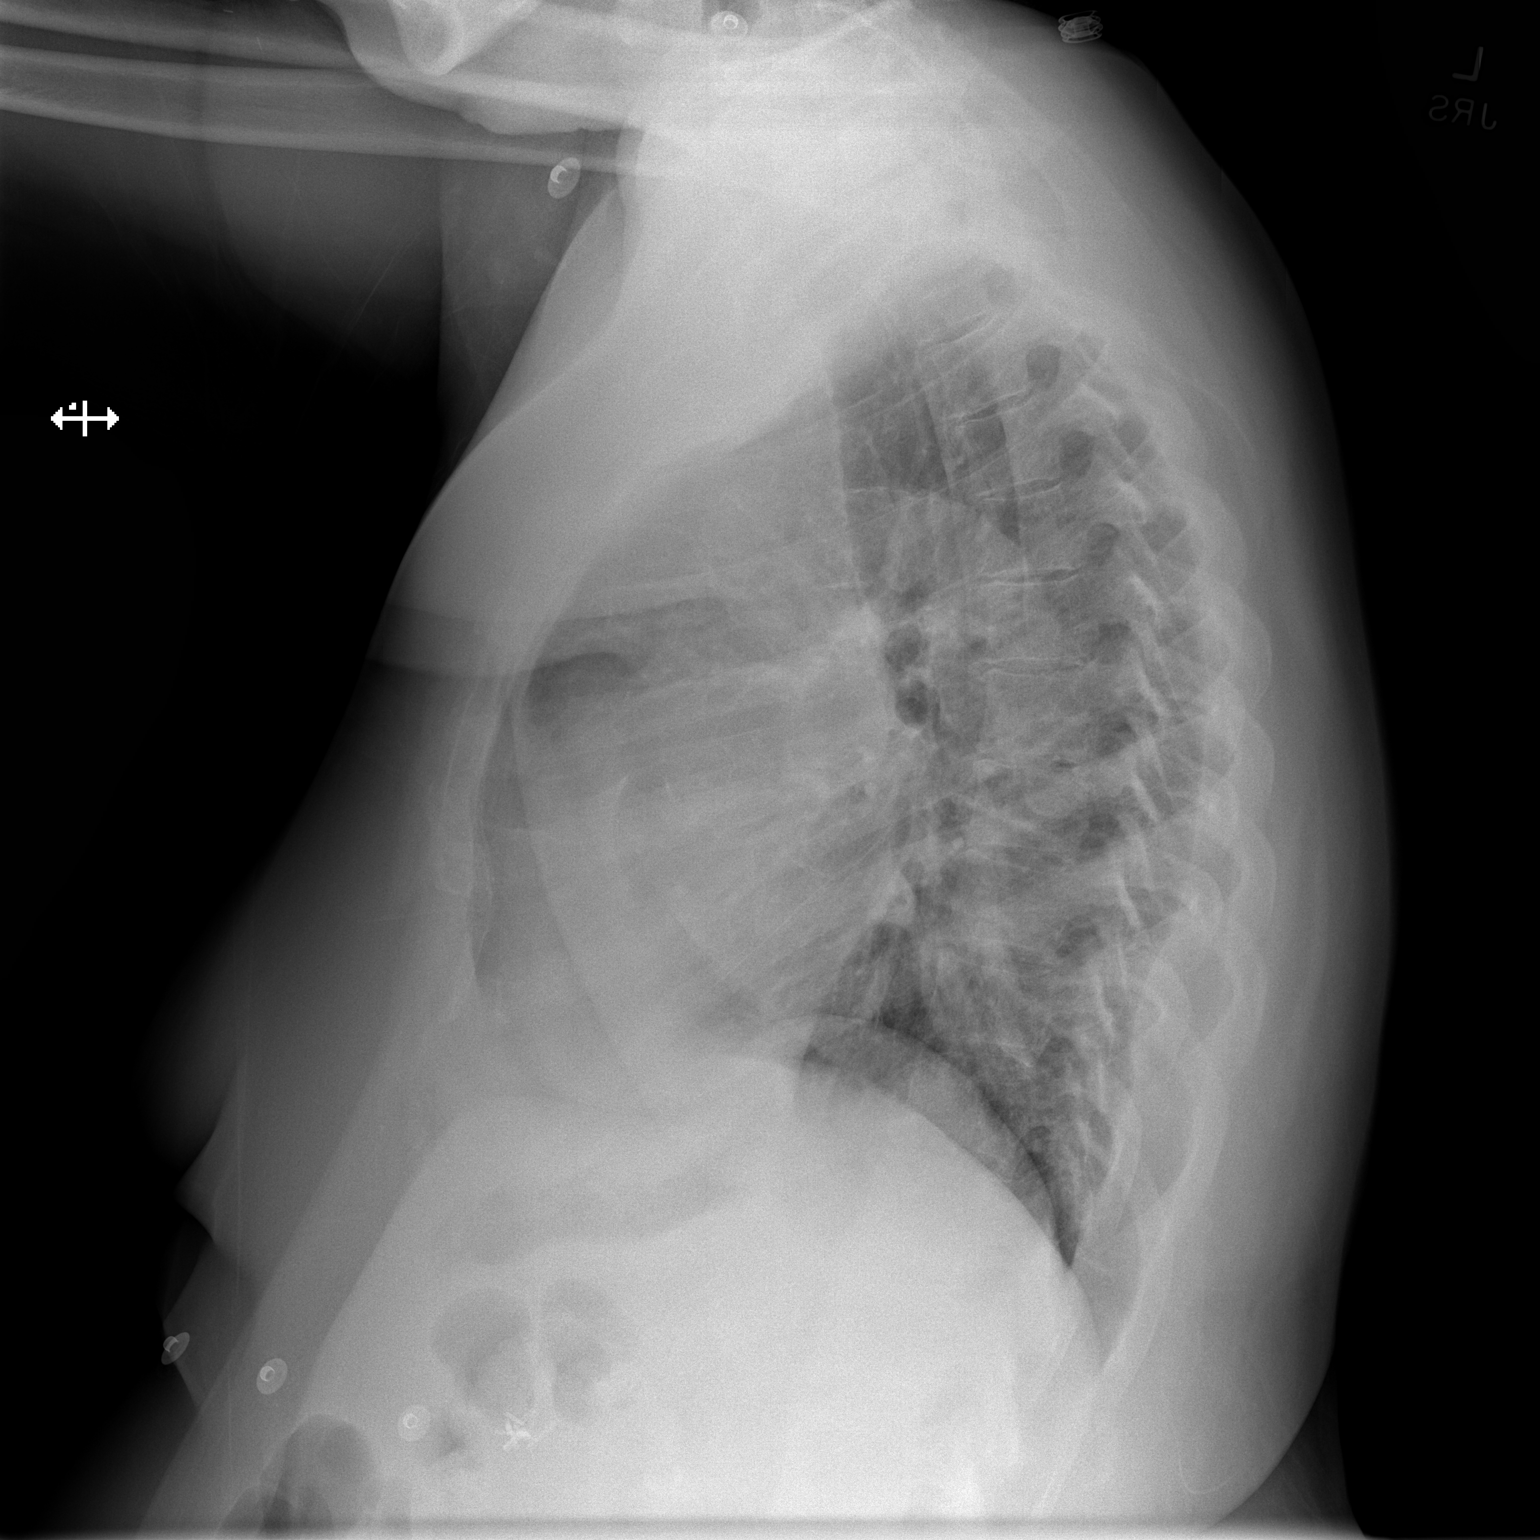

[2 of 2 positions shown; findings below may reference images not displayed]

FINDINGS: The cardiomediastinal silhouette is within normal limits.

The lungs are clear, with no focal consolidation or pulmonary edema.
There is no pleural effusion or pneumothorax.

Apparent irregularity of the distal right clavicle is most likely
positional. The bones are otherwise unremarkable. Right upper
quadrant surgical clips are noted.
IMPRESSION: No radiographic evidence of acute cardiopulmonary process.

## 2020-12-18 NOTE — ED Provider Notes (Signed)
[facility] DEPT Provider Note   CSN: [id number] Arrival date & time: 12/18/20  1217     History Chief Complaint  Patient presents with   Chest Pain   Weakness    [name] is a 60 y.o. female.   Chest Pain Associated symptoms: weakness   Weakness Associated symptoms: chest pain    HPI: A 60 year old patient with a history of treated diabetes, hypertension and hypercholesterolemia presents for evaluation of chest pain. Initial onset of pain was approximately 3-6 hours ago. The patient's chest pain is not worse with exertion. The patient's chest pain is middle- or left-sided, is not well-localized, is not described as heaviness/pressure/tightness, is not sharp and does radiate to the arms/jaw/neck. The patient does not complain of nausea and denies diaphoresis. The patient has no history of stroke, has no history of peripheral artery disease, has not smoked in the past 90 days, has no relevant family history of coronary artery disease (first degree relative at less than age 67) and does not have an elevated BMI (>=30).   Past Medical History:  Diagnosis Date   Accelerated hypertension    Aortic atherosclerosis (HCC)    Arthritis of knee    bilateral   Diabetes mellitus    type 2 - no meds   Diabetic gastroparesis ([place])    Diverticulosis    ESRD (end stage renal disease) ([place])    Fibroid uterus    GERD (gastroesophageal reflux disease)    Hyperlipidemia    Hypertension    HYPERTENSION, BENIGN SYSTEMIC 07/03/2006         Hypertensive emergency 02/05/2019   Hypokalemia    IDA (iron deficiency anemia)    Nausea    Renal disorder    Umbilical hernia    Wears dentures     Patient Active Problem List   Diagnosis Date Noted   Encounter for Papanicolaou smear of cervix 10/19/2020   Labile blood pressure    Sinus tachycardia    ESRD on dialysis ([place])    Cerebellar cerebrovascular accident (CVA) without late effect 09/23/2019   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)    Leukocytosis    Left-sided weakness    Acute ischemic stroke ([place]) 09/19/2019   Stroke ([place]) 09/18/2019   Intractable nausea and vomiting 04/20/2019   Atypical chest pain 04/02/2019   Type 2 diabetes mellitus without complications ([place]) 17/51/0258   Anemia in chronic kidney disease 02/19/2019   ESRD (end stage renal disease) ([place]) 02/19/2019   Secondary hyperparathyroidism of renal origin ([place]) 02/19/2019   Coagulation defect, unspecified ([place]) 02/19/2019   Venous (peripheral) insufficiency 08/06/2017   Encounter for screening for respiratory tuberculosis 01/01/2016   Hypertension    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   Diabetic gastroparesis ([place]) 08/09/2015   Low back pain 06/09/2014   Nausea and vomiting 01/18/2012   Diarrhea, unspecified 01/18/2012   Obstructive sleep apnea of adult 01/16/2012   GERD (gastroesophageal reflux disease) 01/16/2012   Osteoarthritis of both knees 01/16/2012   Hyperlipidemia, unspecified 07/18/2011   LEIOMYOMA, UTERUS 01/14/2007   Morbid obesity ([place]) 07/03/2006   Former smoker 07/03/2006   Tension headache 07/03/2006    Past Surgical History:  Procedure Laterality Date   AV FISTULA PLACEMENT Left 02/17/2019   Procedure: ARTERIOVENOUS (AV) FISTULA CREATION LEFT UPPER ARM;  Surgeon: Waynetta Sandy, MD;  Location: [facility];  Service: Vascular;  Laterality: Left;   CATARACT EXTRACTION     right eye  CESAREAN SECTION     x2   CHOLECYSTECTOMY     laparoscopic   FISTULA SUPERFICIALIZATION Left 04/06/2019   Procedure: FISTULA SUPERFICIALIZATION LEFT BRACHIOCEPHALIC;  Surgeon: Waynetta Sandy, MD;  Location: [facility];  Service: Vascular;  Laterality: Left;  Transposition left arm brachiocephalic fistula.   IR FLUORO GUIDE CV LINE RIGHT  02/09/2019   IR FLUORO GUIDE CV LINE RIGHT  03/05/2019   IR US GUIDE VASC ACCESS RIGHT  02/09/2019   MULTIPLE TOOTH EXTRACTIONS      REDUCTION MAMMAPLASTY Bilateral      OB History   No obstetric history on file.     Family History  Problem Relation Age of Onset   Hypertension Mother    Diabetes Mother    Cancer Father        lung   Colon cancer Neg Hx    Stomach cancer Neg Hx    Esophageal cancer Neg Hx     Social History   Tobacco Use   Smoking status: Former    Years: 10.00    Types: Cigarettes    Quit date: 10/16/2011    Years since quitting: 9.1   Smokeless tobacco: Never   Tobacco comments:    1 pack will last a week  Vaping Use   Vaping Use: Never used  Substance Use Topics   Alcohol use: No    Alcohol/week: 0.0 standard drinks   Drug use: No    Home Medications Prior to Admission medications   Medication Sig Start Date End Date Taking? Authorizing Provider  acetaminophen (TYLENOL) 500 MG tablet Take 1,000 mg by mouth every 6 (six) hours as needed for mild pain or headache.   Yes [provider]  aspirin 325 MG tablet Take 325 mg by mouth daily.   Yes [provider]  Accu-Chek Softclix Lancets lancets Use as instructed Patient taking differently: 1 each by Other route See admin instructions. Use as instructed 09/29/18   Diallo, Earna Coder, MD  Blood Glucose Monitoring Suppl (ACCU-CHEK AVIVA PLUS) w/Device KIT Use to check sugar three times a day Patient taking differently: 1 each by Other route in the morning, at noon, and at bedtime. 05/14/17   [name], MD  Darbepoetin Alfa (ARANESP) 200 MCG/0.4ML SOSY injection Inject 0.4 mLs (200 mcg total) into the vein every Wednesday with hemodialysis. Patient not taking: No sig reported 10/06/19   Angiulli, Lavon Paganini, PA-C  ferric citrate (AURYXIA) 1 GM 210 MG(Fe) tablet Take 2 tablets (420 mg total) by mouth 3 (three) times daily with meals. Patient not taking: No sig reported 10/06/19   Angiulli, Lavon Paganini, PA-C  glucose blood (ACCU-CHEK AVIVA PLUS) test strip Use as instructed Patient taking differently: 1 each by Other  route See admin instructions. Use as instructed 09/29/18   Diallo, Earna Coder, MD  Lancets (ACCU-CHEK SOFT TOUCH) lancets Use to check sugars three times a day Patient taking differently: 1 each by Other route in the morning, at noon, and at bedtime. 09/24/18   Marjie Skiff, MD    Allergies    Lisinopril, Venofer  [ferric oxide], Camellia, and Jardiance [empagliflozin]  Review of Systems   Review of Systems  Cardiovascular:  Positive for chest pain.  Neurological:  Positive for weakness.  All other systems reviewed and are negative.  Physical Exam Updated Vital Signs BP (!) 146/79 (BP Location: Right Arm)   Pulse 84   Temp 98.6 F (37 C) (Oral)   Resp 16   Ht 1.702 m (5'  7")   Wt 108.1 kg   LMP  (LMP Unknown)   SpO2 100%   BMI 37.33 kg/m   Physical Exam Vitals and nursing note reviewed.  Constitutional:      General: She is not in acute distress.    Appearance: She is well-developed.  HENT:     Head: Normocephalic and atraumatic.     Right Ear: External ear normal.     Left Ear: External ear normal.  Eyes:     General: No scleral icterus.       Right eye: No discharge.        Left eye: No discharge.     Conjunctiva/sclera: Conjunctivae normal.  Neck:     Trachea: No tracheal deviation.  Cardiovascular:     Rate and Rhythm: Normal rate and regular rhythm.  Pulmonary:     Effort: Pulmonary effort is normal. No respiratory distress.     Breath sounds: Normal breath sounds. No stridor. No wheezing or rales.  Abdominal:     General: Bowel sounds are normal. There is no distension.     Palpations: Abdomen is soft.     Tenderness: There is no abdominal tenderness. There is no guarding or rebound.  Musculoskeletal:        General: No tenderness or deformity.     Cervical back: Neck supple.  Skin:    General: Skin is warm and dry.     Findings: No rash.  Neurological:     General: No focal deficit present.     Mental Status: She is alert.     Cranial Nerves: No  cranial nerve deficit (no facial droop, extraocular movements intact, no slurred speech).     Sensory: No sensory deficit.     Motor: No abnormal muscle tone or seizure activity.     Coordination: Coordination normal.  Psychiatric:        Mood and Affect: Mood normal.    ED Results / Procedures / Treatments   Labs (all labs ordered are listed, but only abnormal results are displayed) Labs Reviewed  CBC - Abnormal; Notable for the following components:      Result Value   Hemoglobin 11.0 (*)    HCT 35.2 (*)    All other components within normal limits  COMPREHENSIVE METABOLIC PANEL - Abnormal; Notable for the following components:   Chloride 95 (*)    Creatinine, Ser 4.86 (*)    AST 14 (*)    GFR, Estimated 10 (*)    All other components within normal limits  TROPONIN I (HIGH SENSITIVITY)  TROPONIN I (HIGH SENSITIVITY)    EKG EKG Interpretation  Date/Time:  Monday December 18 2020 12:36:37 EDT Ventricular Rate:  88 PR Interval:  162 QRS Duration: 94 QT Interval:  400 QTC Calculation: 484 R Axis:   37 Text Interpretation: Sinus rhythm 12 Lead; [facility] No ST elevation or depression, unchanged from previous Confirmed by Kommor, Madison ([id number]) on 12/18/2020 1:07:20 PM  Radiology DG Chest 2 View  Result Date: 12/18/2020 CLINICAL DATA:  Chest pain EXAM: CHEST - 2 VIEW COMPARISON:  Chest radiograph 09/20/2019 FINDINGS: The cardiomediastinal silhouette is within normal limits. The lungs are clear, with no focal consolidation or pulmonary edema. There is no pleural effusion or pneumothorax. Apparent irregularity of the distal right clavicle is most likely positional. The bones are otherwise unremarkable. Right upper quadrant surgical clips are noted. IMPRESSION: No radiographic evidence of acute cardiopulmonary process. Electronically Signed   By: Court Joy.D.  On: 12/18/2020 13:20    Procedures Procedures   Medications Ordered in ED Medications - No data to display  ED  Course  I have reviewed the triage vital signs and the nursing notes.  Pertinent labs & imaging results that were available during my care of the patient were reviewed by me and considered in my medical decision making (see chart for details).  Clinical Course as of 12/19/20 0005  Mon Dec 18, 2020  1607 CR elevated but decreased from previous [JK]  1607 CBC stable [JK]  1607 Trop normal [JK]  1925 Delta troponin is normal [JK]    Clinical Course User Index [JK] Dorie Rank, MD   MDM Rules/Calculators/A&P HEAR Score: 4                         Patient presents to the ED with for evaluation of chest pain.  Patient also has history of chronic kidney disease and receives dialysis.  She felt somewhat fatigued and weak after that but that is not atypical for her.  No signs of pneumonia on exam.  Doubt pulmonary embolism.  Serial troponins are normal.  Doubt acute coronary syndrome.  Patient symptoms have resolved and she was feeling well.  Appears stable for discharge and will recommend outpatient follow-up with cardiology. Final Clinical Impression(s) / ED Diagnoses Final diagnoses:  Chest pain, unspecified type    Rx / DC Orders ED Discharge Orders     None        Dorie Rank, MD 12/19/20 0005

## 2020-12-18 NOTE — ED Provider Notes (Signed)
Emergency Medicine Provider Triage Evaluation Note  [name] , a 60 y.o. female  was evaluated in triage.  Pt complains of central chest pain that radiated down right arm that lasted for about 30 to 40 minutes during dialysis.  Denies associated shortness of breath, diaphoresis, or vomiting.  Admits to some nausea with the chest pain.  Patient states chest pain was resolved when she was placed on oxygen at dialysis.  No previous MI.  Admits to a previous CVA.  She admits to occasional tobacco use.  She is currently chest pain-free.  She also endorses dizziness which she describes as feeling off balance when she is going from a sitting to standing position.  No changes to vision or speech.  Patient has slight left-sided weakness from previous CVA which she notes has not changed from her baseline. She underwent a full dialysis session today.  Review of Systems  Positive: Chest pain, dizziness Negative: fever  Physical Exam  BP (!) 150/94 (BP Location: Right Arm)   Pulse 88   Temp 98.7 F (37.1 C) (Oral)   Resp 16   LMP  (LMP Unknown)   SpO2 99%  Gen:   Awake, no distress  Resp:  Normal effort  MSK:   Moves extremities without difficulty  Other:  No facial droop, normal speech  Medical Decision Making  Medically screening exam initiated at 12:42 PM.  Appropriate orders placed.  [name] was informed that the remainder of the evaluation will be completed by another provider, this initial triage assessment does not replace that evaluation, and the importance of remaining in the ED until their evaluation is complete.  Cardiac labs ordered   Karie Kirks 12/18/20 1244    Varney Biles, MD 12/19/20 1103

## 2020-12-18 NOTE — Discharge Instructions (Addendum)
Follow up with your doctor to be rechecked. Consider seeing a cardiologist for further evaluation. Return to the ED as needed for recurrent symptoms

## 2020-12-18 NOTE — ED Triage Notes (Signed)
Pt BIB EMS from home dialysis. Pt had chest pain that radiates down right arm. Pt was lethargic and slow to respond. Pt has chronic mild weakness on the left side from a previous stroke. Pt endorsed dizziness upon standing.   130/72 HR 74 CBG 100 SpO2 100% RA

## 2020-12-22 DIAGNOSIS — D689 Coagulation defect, unspecified: Secondary | ICD-10-CM | POA: Diagnosis not present

## 2020-12-22 DIAGNOSIS — Z992 Dependence on renal dialysis: Secondary | ICD-10-CM | POA: Diagnosis not present

## 2020-12-22 DIAGNOSIS — N2581 Secondary hyperparathyroidism of renal origin: Secondary | ICD-10-CM | POA: Diagnosis not present

## 2020-12-22 DIAGNOSIS — N186 End stage renal disease: Secondary | ICD-10-CM | POA: Diagnosis not present

## 2020-12-22 DIAGNOSIS — E876 Hypokalemia: Secondary | ICD-10-CM | POA: Diagnosis not present

## 2020-12-22 DIAGNOSIS — D631 Anemia in chronic kidney disease: Secondary | ICD-10-CM | POA: Diagnosis not present

## 2020-12-25 DIAGNOSIS — E876 Hypokalemia: Secondary | ICD-10-CM | POA: Diagnosis not present

## 2020-12-25 DIAGNOSIS — D689 Coagulation defect, unspecified: Secondary | ICD-10-CM | POA: Diagnosis not present

## 2020-12-25 DIAGNOSIS — Z992 Dependence on renal dialysis: Secondary | ICD-10-CM | POA: Diagnosis not present

## 2020-12-25 DIAGNOSIS — N2581 Secondary hyperparathyroidism of renal origin: Secondary | ICD-10-CM | POA: Diagnosis not present

## 2020-12-25 DIAGNOSIS — N186 End stage renal disease: Secondary | ICD-10-CM | POA: Diagnosis not present

## 2020-12-25 DIAGNOSIS — D631 Anemia in chronic kidney disease: Secondary | ICD-10-CM | POA: Diagnosis not present

## 2020-12-27 DIAGNOSIS — D689 Coagulation defect, unspecified: Secondary | ICD-10-CM | POA: Diagnosis not present

## 2020-12-27 DIAGNOSIS — E876 Hypokalemia: Secondary | ICD-10-CM | POA: Diagnosis not present

## 2020-12-27 DIAGNOSIS — N186 End stage renal disease: Secondary | ICD-10-CM | POA: Diagnosis not present

## 2020-12-27 DIAGNOSIS — Z992 Dependence on renal dialysis: Secondary | ICD-10-CM | POA: Diagnosis not present

## 2020-12-27 DIAGNOSIS — N2581 Secondary hyperparathyroidism of renal origin: Secondary | ICD-10-CM | POA: Diagnosis not present

## 2020-12-27 DIAGNOSIS — D631 Anemia in chronic kidney disease: Secondary | ICD-10-CM | POA: Diagnosis not present

## 2020-12-29 DIAGNOSIS — N2581 Secondary hyperparathyroidism of renal origin: Secondary | ICD-10-CM | POA: Diagnosis not present

## 2020-12-29 DIAGNOSIS — D631 Anemia in chronic kidney disease: Secondary | ICD-10-CM | POA: Diagnosis not present

## 2020-12-29 DIAGNOSIS — E876 Hypokalemia: Secondary | ICD-10-CM | POA: Diagnosis not present

## 2020-12-29 DIAGNOSIS — Z992 Dependence on renal dialysis: Secondary | ICD-10-CM | POA: Diagnosis not present

## 2020-12-29 DIAGNOSIS — N186 End stage renal disease: Secondary | ICD-10-CM | POA: Diagnosis not present

## 2020-12-29 DIAGNOSIS — D689 Coagulation defect, unspecified: Secondary | ICD-10-CM | POA: Diagnosis not present

## 2021-01-01 DIAGNOSIS — E876 Hypokalemia: Secondary | ICD-10-CM | POA: Diagnosis not present

## 2021-01-01 DIAGNOSIS — D631 Anemia in chronic kidney disease: Secondary | ICD-10-CM | POA: Diagnosis not present

## 2021-01-01 DIAGNOSIS — Z992 Dependence on renal dialysis: Secondary | ICD-10-CM | POA: Diagnosis not present

## 2021-01-01 DIAGNOSIS — N2581 Secondary hyperparathyroidism of renal origin: Secondary | ICD-10-CM | POA: Diagnosis not present

## 2021-01-01 DIAGNOSIS — D689 Coagulation defect, unspecified: Secondary | ICD-10-CM | POA: Diagnosis not present

## 2021-01-01 DIAGNOSIS — N186 End stage renal disease: Secondary | ICD-10-CM | POA: Diagnosis not present

## 2021-01-03 DIAGNOSIS — Z992 Dependence on renal dialysis: Secondary | ICD-10-CM | POA: Diagnosis not present

## 2021-01-03 DIAGNOSIS — E876 Hypokalemia: Secondary | ICD-10-CM | POA: Diagnosis not present

## 2021-01-03 DIAGNOSIS — N186 End stage renal disease: Secondary | ICD-10-CM | POA: Diagnosis not present

## 2021-01-03 DIAGNOSIS — N2581 Secondary hyperparathyroidism of renal origin: Secondary | ICD-10-CM | POA: Diagnosis not present

## 2021-01-03 DIAGNOSIS — D689 Coagulation defect, unspecified: Secondary | ICD-10-CM | POA: Diagnosis not present

## 2021-01-03 DIAGNOSIS — I129 Hypertensive chronic kidney disease with stage 1 through stage 4 chronic kidney disease, or unspecified chronic kidney disease: Secondary | ICD-10-CM | POA: Diagnosis not present

## 2021-01-03 DIAGNOSIS — D631 Anemia in chronic kidney disease: Secondary | ICD-10-CM | POA: Diagnosis not present

## 2021-01-05 DIAGNOSIS — E876 Hypokalemia: Secondary | ICD-10-CM | POA: Diagnosis not present

## 2021-01-05 DIAGNOSIS — N186 End stage renal disease: Secondary | ICD-10-CM | POA: Diagnosis not present

## 2021-01-05 DIAGNOSIS — Z992 Dependence on renal dialysis: Secondary | ICD-10-CM | POA: Diagnosis not present

## 2021-01-05 DIAGNOSIS — W461XXA Contact with contaminated hypodermic needle, initial encounter: Secondary | ICD-10-CM | POA: Diagnosis not present

## 2021-01-05 DIAGNOSIS — D509 Iron deficiency anemia, unspecified: Secondary | ICD-10-CM | POA: Diagnosis not present

## 2021-01-05 DIAGNOSIS — I129 Hypertensive chronic kidney disease with stage 1 through stage 4 chronic kidney disease, or unspecified chronic kidney disease: Secondary | ICD-10-CM | POA: Diagnosis not present

## 2021-01-05 DIAGNOSIS — D689 Coagulation defect, unspecified: Secondary | ICD-10-CM | POA: Diagnosis not present

## 2021-01-05 DIAGNOSIS — N2581 Secondary hyperparathyroidism of renal origin: Secondary | ICD-10-CM | POA: Diagnosis not present

## 2021-01-05 DIAGNOSIS — D631 Anemia in chronic kidney disease: Secondary | ICD-10-CM | POA: Diagnosis not present

## 2021-01-08 DIAGNOSIS — D631 Anemia in chronic kidney disease: Secondary | ICD-10-CM | POA: Diagnosis not present

## 2021-01-08 DIAGNOSIS — I129 Hypertensive chronic kidney disease with stage 1 through stage 4 chronic kidney disease, or unspecified chronic kidney disease: Secondary | ICD-10-CM | POA: Diagnosis not present

## 2021-01-08 DIAGNOSIS — N2581 Secondary hyperparathyroidism of renal origin: Secondary | ICD-10-CM | POA: Diagnosis not present

## 2021-01-08 DIAGNOSIS — D509 Iron deficiency anemia, unspecified: Secondary | ICD-10-CM | POA: Diagnosis not present

## 2021-01-08 DIAGNOSIS — W461XXA Contact with contaminated hypodermic needle, initial encounter: Secondary | ICD-10-CM | POA: Diagnosis not present

## 2021-01-08 DIAGNOSIS — Z992 Dependence on renal dialysis: Secondary | ICD-10-CM | POA: Diagnosis not present

## 2021-01-08 DIAGNOSIS — D689 Coagulation defect, unspecified: Secondary | ICD-10-CM | POA: Diagnosis not present

## 2021-01-08 DIAGNOSIS — E876 Hypokalemia: Secondary | ICD-10-CM | POA: Diagnosis not present

## 2021-01-08 DIAGNOSIS — N186 End stage renal disease: Secondary | ICD-10-CM | POA: Diagnosis not present

## 2021-01-10 DIAGNOSIS — N186 End stage renal disease: Secondary | ICD-10-CM | POA: Diagnosis not present

## 2021-01-10 DIAGNOSIS — Z992 Dependence on renal dialysis: Secondary | ICD-10-CM | POA: Diagnosis not present

## 2021-01-10 DIAGNOSIS — D689 Coagulation defect, unspecified: Secondary | ICD-10-CM | POA: Diagnosis not present

## 2021-01-10 DIAGNOSIS — W461XXA Contact with contaminated hypodermic needle, initial encounter: Secondary | ICD-10-CM | POA: Diagnosis not present

## 2021-01-10 DIAGNOSIS — E876 Hypokalemia: Secondary | ICD-10-CM | POA: Diagnosis not present

## 2021-01-10 DIAGNOSIS — D631 Anemia in chronic kidney disease: Secondary | ICD-10-CM | POA: Diagnosis not present

## 2021-01-10 DIAGNOSIS — D509 Iron deficiency anemia, unspecified: Secondary | ICD-10-CM | POA: Diagnosis not present

## 2021-01-10 DIAGNOSIS — I129 Hypertensive chronic kidney disease with stage 1 through stage 4 chronic kidney disease, or unspecified chronic kidney disease: Secondary | ICD-10-CM | POA: Diagnosis not present

## 2021-01-10 DIAGNOSIS — N2581 Secondary hyperparathyroidism of renal origin: Secondary | ICD-10-CM | POA: Diagnosis not present

## 2021-01-12 DIAGNOSIS — W461XXA Contact with contaminated hypodermic needle, initial encounter: Secondary | ICD-10-CM | POA: Diagnosis not present

## 2021-01-12 DIAGNOSIS — E876 Hypokalemia: Secondary | ICD-10-CM | POA: Diagnosis not present

## 2021-01-12 DIAGNOSIS — I129 Hypertensive chronic kidney disease with stage 1 through stage 4 chronic kidney disease, or unspecified chronic kidney disease: Secondary | ICD-10-CM | POA: Diagnosis not present

## 2021-01-12 DIAGNOSIS — N2581 Secondary hyperparathyroidism of renal origin: Secondary | ICD-10-CM | POA: Diagnosis not present

## 2021-01-12 DIAGNOSIS — N186 End stage renal disease: Secondary | ICD-10-CM | POA: Diagnosis not present

## 2021-01-12 DIAGNOSIS — D631 Anemia in chronic kidney disease: Secondary | ICD-10-CM | POA: Diagnosis not present

## 2021-01-12 DIAGNOSIS — D509 Iron deficiency anemia, unspecified: Secondary | ICD-10-CM | POA: Diagnosis not present

## 2021-01-12 DIAGNOSIS — Z992 Dependence on renal dialysis: Secondary | ICD-10-CM | POA: Diagnosis not present

## 2021-01-12 DIAGNOSIS — D689 Coagulation defect, unspecified: Secondary | ICD-10-CM | POA: Diagnosis not present

## 2021-01-15 DIAGNOSIS — D509 Iron deficiency anemia, unspecified: Secondary | ICD-10-CM | POA: Diagnosis not present

## 2021-01-15 DIAGNOSIS — Z992 Dependence on renal dialysis: Secondary | ICD-10-CM | POA: Diagnosis not present

## 2021-01-15 DIAGNOSIS — W461XXA Contact with contaminated hypodermic needle, initial encounter: Secondary | ICD-10-CM | POA: Diagnosis not present

## 2021-01-15 DIAGNOSIS — E876 Hypokalemia: Secondary | ICD-10-CM | POA: Diagnosis not present

## 2021-01-15 DIAGNOSIS — D631 Anemia in chronic kidney disease: Secondary | ICD-10-CM | POA: Diagnosis not present

## 2021-01-15 DIAGNOSIS — N186 End stage renal disease: Secondary | ICD-10-CM | POA: Diagnosis not present

## 2021-01-15 DIAGNOSIS — D689 Coagulation defect, unspecified: Secondary | ICD-10-CM | POA: Diagnosis not present

## 2021-01-15 DIAGNOSIS — N2581 Secondary hyperparathyroidism of renal origin: Secondary | ICD-10-CM | POA: Diagnosis not present

## 2021-01-15 DIAGNOSIS — I129 Hypertensive chronic kidney disease with stage 1 through stage 4 chronic kidney disease, or unspecified chronic kidney disease: Secondary | ICD-10-CM | POA: Diagnosis not present

## 2021-01-19 DIAGNOSIS — I129 Hypertensive chronic kidney disease with stage 1 through stage 4 chronic kidney disease, or unspecified chronic kidney disease: Secondary | ICD-10-CM | POA: Diagnosis not present

## 2021-01-19 DIAGNOSIS — Z992 Dependence on renal dialysis: Secondary | ICD-10-CM | POA: Diagnosis not present

## 2021-01-19 DIAGNOSIS — D509 Iron deficiency anemia, unspecified: Secondary | ICD-10-CM | POA: Diagnosis not present

## 2021-01-19 DIAGNOSIS — D689 Coagulation defect, unspecified: Secondary | ICD-10-CM | POA: Diagnosis not present

## 2021-01-19 DIAGNOSIS — D631 Anemia in chronic kidney disease: Secondary | ICD-10-CM | POA: Diagnosis not present

## 2021-01-19 DIAGNOSIS — E876 Hypokalemia: Secondary | ICD-10-CM | POA: Diagnosis not present

## 2021-01-19 DIAGNOSIS — N186 End stage renal disease: Secondary | ICD-10-CM | POA: Diagnosis not present

## 2021-01-19 DIAGNOSIS — N2581 Secondary hyperparathyroidism of renal origin: Secondary | ICD-10-CM | POA: Diagnosis not present

## 2021-01-19 DIAGNOSIS — W461XXA Contact with contaminated hypodermic needle, initial encounter: Secondary | ICD-10-CM | POA: Diagnosis not present

## 2021-01-22 ENCOUNTER — Telehealth: Payer: Self-pay | Admitting: Internal Medicine

## 2021-01-22 DIAGNOSIS — D689 Coagulation defect, unspecified: Secondary | ICD-10-CM | POA: Diagnosis not present

## 2021-01-22 DIAGNOSIS — I129 Hypertensive chronic kidney disease with stage 1 through stage 4 chronic kidney disease, or unspecified chronic kidney disease: Secondary | ICD-10-CM | POA: Diagnosis not present

## 2021-01-22 DIAGNOSIS — N186 End stage renal disease: Secondary | ICD-10-CM | POA: Diagnosis not present

## 2021-01-22 DIAGNOSIS — Z992 Dependence on renal dialysis: Secondary | ICD-10-CM | POA: Diagnosis not present

## 2021-01-22 DIAGNOSIS — N2581 Secondary hyperparathyroidism of renal origin: Secondary | ICD-10-CM | POA: Diagnosis not present

## 2021-01-22 DIAGNOSIS — D509 Iron deficiency anemia, unspecified: Secondary | ICD-10-CM | POA: Diagnosis not present

## 2021-01-22 DIAGNOSIS — E876 Hypokalemia: Secondary | ICD-10-CM | POA: Diagnosis not present

## 2021-01-22 DIAGNOSIS — W461XXA Contact with contaminated hypodermic needle, initial encounter: Secondary | ICD-10-CM | POA: Diagnosis not present

## 2021-01-22 DIAGNOSIS — D631 Anemia in chronic kidney disease: Secondary | ICD-10-CM | POA: Diagnosis not present

## 2021-01-22 NOTE — Telephone Encounter (Signed)
Pt's sister stopped by to inform that pt has been experiencing abd pain since yesterday. She is requesting some advise or an appt to see Dr. Hilarie Fredrickson. Pls call pt directly.

## 2021-01-22 NOTE — Telephone Encounter (Signed)
Pt stated that she has been experiencing off and on left sided abdominal pain for several months now associated with nausea at times. Pt states that the pain is typically after she eats. Pt has not been seen in the office in over a year. Appointment scheduled with Alonza Bogus PA on Thursday 02/08/2021 @ 1:30. Pt made aware

## 2021-01-24 DIAGNOSIS — D689 Coagulation defect, unspecified: Secondary | ICD-10-CM | POA: Diagnosis not present

## 2021-01-24 DIAGNOSIS — Z992 Dependence on renal dialysis: Secondary | ICD-10-CM | POA: Diagnosis not present

## 2021-01-24 DIAGNOSIS — N2581 Secondary hyperparathyroidism of renal origin: Secondary | ICD-10-CM | POA: Diagnosis not present

## 2021-01-24 DIAGNOSIS — I129 Hypertensive chronic kidney disease with stage 1 through stage 4 chronic kidney disease, or unspecified chronic kidney disease: Secondary | ICD-10-CM | POA: Diagnosis not present

## 2021-01-24 DIAGNOSIS — N186 End stage renal disease: Secondary | ICD-10-CM | POA: Diagnosis not present

## 2021-01-24 DIAGNOSIS — D509 Iron deficiency anemia, unspecified: Secondary | ICD-10-CM | POA: Diagnosis not present

## 2021-01-24 DIAGNOSIS — W461XXA Contact with contaminated hypodermic needle, initial encounter: Secondary | ICD-10-CM | POA: Diagnosis not present

## 2021-01-24 DIAGNOSIS — E876 Hypokalemia: Secondary | ICD-10-CM | POA: Diagnosis not present

## 2021-01-24 DIAGNOSIS — D631 Anemia in chronic kidney disease: Secondary | ICD-10-CM | POA: Diagnosis not present

## 2021-01-26 DIAGNOSIS — D689 Coagulation defect, unspecified: Secondary | ICD-10-CM | POA: Diagnosis not present

## 2021-01-26 DIAGNOSIS — I129 Hypertensive chronic kidney disease with stage 1 through stage 4 chronic kidney disease, or unspecified chronic kidney disease: Secondary | ICD-10-CM | POA: Diagnosis not present

## 2021-01-26 DIAGNOSIS — W461XXA Contact with contaminated hypodermic needle, initial encounter: Secondary | ICD-10-CM | POA: Diagnosis not present

## 2021-01-26 DIAGNOSIS — N2581 Secondary hyperparathyroidism of renal origin: Secondary | ICD-10-CM | POA: Diagnosis not present

## 2021-01-26 DIAGNOSIS — D631 Anemia in chronic kidney disease: Secondary | ICD-10-CM | POA: Diagnosis not present

## 2021-01-26 DIAGNOSIS — Z992 Dependence on renal dialysis: Secondary | ICD-10-CM | POA: Diagnosis not present

## 2021-01-26 DIAGNOSIS — N186 End stage renal disease: Secondary | ICD-10-CM | POA: Diagnosis not present

## 2021-01-26 DIAGNOSIS — D509 Iron deficiency anemia, unspecified: Secondary | ICD-10-CM | POA: Diagnosis not present

## 2021-01-26 DIAGNOSIS — E876 Hypokalemia: Secondary | ICD-10-CM | POA: Diagnosis not present

## 2021-01-29 DIAGNOSIS — W461XXA Contact with contaminated hypodermic needle, initial encounter: Secondary | ICD-10-CM | POA: Diagnosis not present

## 2021-01-29 DIAGNOSIS — Z992 Dependence on renal dialysis: Secondary | ICD-10-CM | POA: Diagnosis not present

## 2021-01-29 DIAGNOSIS — D689 Coagulation defect, unspecified: Secondary | ICD-10-CM | POA: Diagnosis not present

## 2021-01-29 DIAGNOSIS — E876 Hypokalemia: Secondary | ICD-10-CM | POA: Diagnosis not present

## 2021-01-29 DIAGNOSIS — I129 Hypertensive chronic kidney disease with stage 1 through stage 4 chronic kidney disease, or unspecified chronic kidney disease: Secondary | ICD-10-CM | POA: Diagnosis not present

## 2021-01-29 DIAGNOSIS — D631 Anemia in chronic kidney disease: Secondary | ICD-10-CM | POA: Diagnosis not present

## 2021-01-29 DIAGNOSIS — N2581 Secondary hyperparathyroidism of renal origin: Secondary | ICD-10-CM | POA: Diagnosis not present

## 2021-01-29 DIAGNOSIS — D509 Iron deficiency anemia, unspecified: Secondary | ICD-10-CM | POA: Diagnosis not present

## 2021-01-29 DIAGNOSIS — N186 End stage renal disease: Secondary | ICD-10-CM | POA: Diagnosis not present

## 2021-01-30 ENCOUNTER — Ambulatory Visit (INDEPENDENT_AMBULATORY_CARE_PROVIDER_SITE_OTHER): Payer: Medicare Other | Admitting: Family Medicine

## 2021-01-30 ENCOUNTER — Other Ambulatory Visit: Payer: Self-pay

## 2021-01-30 ENCOUNTER — Encounter: Payer: Self-pay | Admitting: Family Medicine

## 2021-01-30 VITALS — BP 133/63 | HR 100 | Ht 67.0 in | Wt 225.0 lb

## 2021-01-30 DIAGNOSIS — E119 Type 2 diabetes mellitus without complications: Secondary | ICD-10-CM | POA: Diagnosis not present

## 2021-01-30 DIAGNOSIS — Z0000 Encounter for general adult medical examination without abnormal findings: Secondary | ICD-10-CM | POA: Diagnosis not present

## 2021-01-30 LAB — POCT GLYCOSYLATED HEMOGLOBIN (HGB A1C): HbA1c, POC (controlled diabetic range): 5.4 % (ref 0.0–7.0)

## 2021-01-30 NOTE — Progress Notes (Signed)
Subjective:     [name] is a 60 y.o. female and is here for a comprehensive physical exam. The patient reports no problems.  She denies alcohol use. She smoked one pack of tobacco per week for 30 years, i.e., 0.1 sticks per day. Quit in 2020, although the record indicated that she quit in 2013.  She requested a handicap DMV form completion for her DJD. No other concerns.  DM2: Not on meds, eye exam was on 12/04/20. No other concerns. She is compliant with her dialysis. Social History   Socioeconomic History   Marital status: Legally Separated    Spouse name: Not on file   Number of children: 2   Years of education: Not on file   Highest education level: Not on file  Occupational History   Occupation: housekeeping  Tobacco Use   Smoking status: Former    Years: 10.00    Types: Cigarettes    Quit date: 10/16/2011    Years since quitting: 9.2   Smokeless tobacco: Never   Tobacco comments:    1 pack will last a week  Vaping Use   Vaping Use: Never used  Substance and Sexual Activity   Alcohol use: No    Alcohol/week: 0.0 standard drinks   Drug use: No   Sexual activity: Not Currently  Other Topics Concern   Not on file  Social History Narrative   Not on file   Social Determinants of Health   Financial Resource Strain: Not on file  Food Insecurity: Not on file  Transportation Needs: Not on file  Physical Activity: Not on file  Stress: Not on file  Social Connections: Not on file  Intimate Partner Violence: Not on file   Health Maintenance  Topic Date Due   Zoster Vaccines- Shingrix (1 of 2) Never done   FOOT EXAM  08/20/2016   URINE MICROALBUMIN  11/12/2019   OPHTHALMOLOGY EXAM  12/10/2019   COVID-19 Vaccine (3 - Booster for [place] series) 06/09/2020   INFLUENZA VACCINE  12/04/2020   COLONOSCOPY (Pts 45-86yrs Insurance coverage will need to be confirmed)  05/13/2021   TETANUS/TDAP  05/16/2021   HEMOGLOBIN A1C  07/30/2021   MAMMOGRAM  10/20/2022   PAP  SMEAR-Modifier  10/19/2025   Hepatitis C Screening  Completed   HIV Screening  Completed   HPV VACCINES  Aged Out    The following portions of the patient's history were reviewed and updated as appropriate: allergies, current medications, past family history, past medical history, past social history, past surgical history, and problem list.  Review of Systems Pertinent items are noted in HPI.   Objective:    BP 133/63   Pulse 100   Ht 5\' 7"  (1.702 m)   Wt 225 lb (102.1 kg)   LMP  (LMP Unknown)   SpO2 100%   BMI 35.24 kg/m  General appearance: alert Head: Normocephalic, without obvious abnormality, atraumatic Eyes: conjunctivae/corneas clear. PERRL, EOM's intact. Fundi benign. Ears: normal TM's and external ear canals both ears Throat: lips, mucosa, and tongue normal; teeth and gums normal Neck: no adenopathy, no carotid bruit, no JVD, supple, symmetrical, trachea midline, and thyroid not enlarged, symmetric, no tenderness/mass/nodules Lungs: clear to auscultation bilaterally Heart: regular rate and rhythm, S1, S2 normal, no murmur, click, rub or gallop Abdomen: soft, non-tender; bowel sounds normal; no masses,  no organomegaly Pelvic: deferred Extremities: extremities normal, atraumatic, no cyanosis or edema Pulses: 2+ and symmetric Lymph nodes: Cervical, supraclavicular, and axillary nodes normal. Neurologic: Alert  and oriented X 3, normal strength and tone. Normal symmetric reflexes. Normal coordination and gait    Assessment:    Healthy female exam.    Plan:    Normal exam She is up to date with mammogram, PAP and colon cancer screen. She declined Shingrix, flu and COVID shots A1C looks good. FLP checked. She does not qualify for lung cancer screen. PYL = 3 based on the hx she provided today. DMV handicap form completed.  See After Visit Summary for Counseling Recommendations

## 2021-01-30 NOTE — Patient Instructions (Signed)
Preventive Care 40-62 Years Old, Female Preventive care refers to lifestyle choices and visits with your health care provider that can promote health and wellness. This includes: A yearly physical exam. This is also called an annual wellness visit. Regular dental and eye exams. Immunizations. Screening for certain conditions. Healthy lifestyle choices, such as: Eating a healthy diet. Getting regular exercise. Not using drugs or products that contain nicotine and tobacco. Limiting alcohol use. What can I expect for my preventive care visit? Physical exam Your health care provider will check your: Height and weight. These may be used to calculate your BMI (body mass index). BMI is a measurement that tells if you are at a healthy weight. Heart rate and blood pressure. Body temperature. Skin for abnormal spots. Counseling Your health care provider may ask you questions about your: Past medical problems. Family's medical history. Alcohol, tobacco, and drug use. Emotional well-being. Home life and relationship well-being. Sexual activity. Diet, exercise, and sleep habits. Work and work environment. Access to firearms. Method of birth control. Menstrual cycle. Pregnancy history. What immunizations do I need? Vaccines are usually given at various ages, according to a schedule. Your health care provider will recommend vaccines for you based on your age, medical history, and lifestyle or other factors, such as travel or where you work. What tests do I need? Blood tests Lipid and cholesterol levels. These may be checked every 5 years, or more often if you are over 50 years old. Hepatitis C test. Hepatitis B test. Screening Lung cancer screening. You may have this screening every year starting at age 62 if you have a 30-pack-year history of smoking and currently smoke or have quit within the past 15 years. Colorectal cancer screening. All adults should have this screening starting at  age 50 and continuing until age 75. Your health care provider may recommend screening at age 45 if you are at increased risk. You will have tests every 1-10 years, depending on your results and the type of screening test. Diabetes screening. This is done by checking your blood sugar (glucose) after you have not eaten for a while (fasting). You may have this done every 1-3 years. Mammogram. This may be done every 1-2 years. Talk with your health care provider about when you should start having regular mammograms. This may depend on whether you have a family history of breast cancer. BRCA-related cancer screening. This may be done if you have a family history of breast, ovarian, tubal, or peritoneal cancers. Pelvic exam and Pap test. This may be done every 3 years starting at age 21. Starting at age 62, this may be done every 5 years if you have a Pap test in combination with an HPV test. Other tests STD (sexually transmitted disease) testing, if you are at risk. Bone density scan. This is done to screen for osteoporosis. You may have this scan if you are at high risk for osteoporosis. Talk with your health care provider about your test results, treatment options, and if necessary, the need for more tests. Follow these instructions at home: Eating and drinking  Eat a diet that includes fresh fruits and vegetables, whole grains, lean protein, and low-fat dairy products. Take vitamin and mineral supplements as recommended by your health care provider. Do not drink alcohol if: Your health care provider tells you not to drink. You are pregnant, may be pregnant, or are planning to become pregnant. If you drink alcohol: Limit how much you have to 0-1 drink a day. Be   aware of how much alcohol is in your drink. In the [place] one drink equals one 12 oz bottle of beer (355 mL), one 5 oz glass of wine (148 mL), or one 1 oz glass of hard liquor (44 mL). Lifestyle Take daily care of your teeth and  gums. Brush your teeth every morning and night with fluoride toothpaste. Floss one time each day. Stay active. Exercise for at least 30 minutes 5 or more days each week. Do not use any products that contain nicotine or tobacco, such as cigarettes, e-cigarettes, and chewing tobacco. If you need help quitting, ask your health care provider. Do not use drugs. If you are sexually active, practice safe sex. Use a condom or other form of protection to prevent STIs (sexually transmitted infections). If you do not wish to become pregnant, use a form of birth control. If you plan to become pregnant, see your health care provider for a prepregnancy visit. If told by your health care provider, take low-dose aspirin daily starting at age 62. Find healthy ways to cope with stress, such as: Meditation, yoga, or listening to music. Journaling. Talking to a trusted person. Spending time with friends and family. Safety Always wear your seat belt while driving or riding in a vehicle. Do not drive: If you have been drinking alcohol. Do not ride with someone who has been drinking. When you are tired or distracted. While texting. Wear a helmet and other protective equipment during sports activities. If you have firearms in your house, make sure you follow all gun safety procedures. What's next? Visit your health care provider once a year for an annual wellness visit. Ask your health care provider how often you should have your eyes and teeth checked. Stay up to date on all vaccines. This information is not intended to replace advice given to you by your health care provider. Make sure you discuss any questions you have with your health care provider. Document Revised: 06/30/2020 Document Reviewed: 01/01/2018 Elsevier Patient Education  2022 [employer].

## 2021-01-31 ENCOUNTER — Telehealth: Payer: Self-pay | Admitting: Family Medicine

## 2021-01-31 ENCOUNTER — Other Ambulatory Visit: Payer: Self-pay | Admitting: Family Medicine

## 2021-01-31 DIAGNOSIS — Z992 Dependence on renal dialysis: Secondary | ICD-10-CM | POA: Diagnosis not present

## 2021-01-31 DIAGNOSIS — D689 Coagulation defect, unspecified: Secondary | ICD-10-CM | POA: Diagnosis not present

## 2021-01-31 DIAGNOSIS — I129 Hypertensive chronic kidney disease with stage 1 through stage 4 chronic kidney disease, or unspecified chronic kidney disease: Secondary | ICD-10-CM | POA: Diagnosis not present

## 2021-01-31 DIAGNOSIS — D631 Anemia in chronic kidney disease: Secondary | ICD-10-CM | POA: Diagnosis not present

## 2021-01-31 DIAGNOSIS — N2581 Secondary hyperparathyroidism of renal origin: Secondary | ICD-10-CM | POA: Diagnosis not present

## 2021-01-31 DIAGNOSIS — N186 End stage renal disease: Secondary | ICD-10-CM | POA: Diagnosis not present

## 2021-01-31 DIAGNOSIS — D509 Iron deficiency anemia, unspecified: Secondary | ICD-10-CM | POA: Diagnosis not present

## 2021-01-31 DIAGNOSIS — W461XXA Contact with contaminated hypodermic needle, initial encounter: Secondary | ICD-10-CM | POA: Diagnosis not present

## 2021-01-31 DIAGNOSIS — E876 Hypokalemia: Secondary | ICD-10-CM | POA: Diagnosis not present

## 2021-01-31 LAB — LIPID PANEL
Chol/HDL Ratio: 3.7 ratio (ref 0.0–4.4)
Cholesterol, Total: 222 mg/dL — ABNORMAL HIGH (ref 100–199)
HDL: 60 mg/dL (ref >39)
LDL Chol Calc (NIH): 147 mg/dL — ABNORMAL HIGH (ref 0–99)
Triglycerides: 86 mg/dL (ref 0–149)
VLDL Cholesterol Cal: 15 mg/dL (ref 5–40)

## 2021-01-31 MED ORDER — ROSUVASTATIN CALCIUM 5 MG PO TABS: 5.0000 mg | ORAL_TABLET | Freq: Every day | ORAL | 3 refills | Status: DC

## 2021-01-31 NOTE — Telephone Encounter (Signed)
HIPAA compliant callback message left.  Note: Lipid panel is abnormal.  With her hx of DM, her 10 yrs ASCVD risk is 16.2%. Hence the need to initiate moderate-intensity Statins.  However, her A1C had always been in the PreDM range. Hence her ASCVD may actually be 7.1%, and the recommendation is to consider moderate-intensity Statins.  Need to discuss this with me or her PCP.

## 2021-01-31 NOTE — Telephone Encounter (Signed)
Patient returned call to nurse line to discuss these results.   Will forward to PCP and Dr. Gwendlyn Deutscher to further discuss initiation of statin therapy.   Talbot Grumbling, RN

## 2021-02-02 DIAGNOSIS — Z992 Dependence on renal dialysis: Secondary | ICD-10-CM | POA: Diagnosis not present

## 2021-02-02 DIAGNOSIS — E876 Hypokalemia: Secondary | ICD-10-CM | POA: Diagnosis not present

## 2021-02-02 DIAGNOSIS — D631 Anemia in chronic kidney disease: Secondary | ICD-10-CM | POA: Diagnosis not present

## 2021-02-02 DIAGNOSIS — W461XXA Contact with contaminated hypodermic needle, initial encounter: Secondary | ICD-10-CM | POA: Diagnosis not present

## 2021-02-02 DIAGNOSIS — N2581 Secondary hyperparathyroidism of renal origin: Secondary | ICD-10-CM | POA: Diagnosis not present

## 2021-02-02 DIAGNOSIS — D509 Iron deficiency anemia, unspecified: Secondary | ICD-10-CM | POA: Diagnosis not present

## 2021-02-02 DIAGNOSIS — N186 End stage renal disease: Secondary | ICD-10-CM | POA: Diagnosis not present

## 2021-02-02 DIAGNOSIS — D689 Coagulation defect, unspecified: Secondary | ICD-10-CM | POA: Diagnosis not present

## 2021-02-02 DIAGNOSIS — I129 Hypertensive chronic kidney disease with stage 1 through stage 4 chronic kidney disease, or unspecified chronic kidney disease: Secondary | ICD-10-CM | POA: Diagnosis not present

## 2021-02-05 DIAGNOSIS — D689 Coagulation defect, unspecified: Secondary | ICD-10-CM | POA: Diagnosis not present

## 2021-02-05 DIAGNOSIS — N186 End stage renal disease: Secondary | ICD-10-CM | POA: Diagnosis not present

## 2021-02-05 DIAGNOSIS — N2581 Secondary hyperparathyroidism of renal origin: Secondary | ICD-10-CM | POA: Diagnosis not present

## 2021-02-05 DIAGNOSIS — D631 Anemia in chronic kidney disease: Secondary | ICD-10-CM | POA: Diagnosis not present

## 2021-02-05 DIAGNOSIS — E876 Hypokalemia: Secondary | ICD-10-CM | POA: Diagnosis not present

## 2021-02-05 DIAGNOSIS — Z992 Dependence on renal dialysis: Secondary | ICD-10-CM | POA: Diagnosis not present

## 2021-02-05 DIAGNOSIS — D509 Iron deficiency anemia, unspecified: Secondary | ICD-10-CM | POA: Diagnosis not present

## 2021-02-05 DIAGNOSIS — E119 Type 2 diabetes mellitus without complications: Secondary | ICD-10-CM | POA: Diagnosis not present

## 2021-02-08 ENCOUNTER — Encounter: Payer: Self-pay | Admitting: Physician Assistant

## 2021-02-08 ENCOUNTER — Other Ambulatory Visit: Payer: Self-pay | Admitting: Nephrology

## 2021-02-08 ENCOUNTER — Ambulatory Visit (INDEPENDENT_AMBULATORY_CARE_PROVIDER_SITE_OTHER): Payer: Medicare Other | Admitting: Physician Assistant

## 2021-02-08 ENCOUNTER — Ambulatory Visit: Payer: Medicare Other | Admitting: Gastroenterology

## 2021-02-08 VITALS — BP 156/60 | HR 88 | Ht 67.25 in | Wt 231.2 lb

## 2021-02-08 DIAGNOSIS — K5909 Other constipation: Secondary | ICD-10-CM

## 2021-02-08 DIAGNOSIS — K3184 Gastroparesis: Secondary | ICD-10-CM

## 2021-02-08 DIAGNOSIS — R112 Nausea with vomiting, unspecified: Secondary | ICD-10-CM

## 2021-02-08 DIAGNOSIS — R1012 Left upper quadrant pain: Secondary | ICD-10-CM

## 2021-02-08 DIAGNOSIS — Z949 Transplanted organ and tissue status, unspecified: Secondary | ICD-10-CM

## 2021-02-08 MED ORDER — ONDANSETRON 4 MG PO TBDP: 4.0000 mg | ORAL_TABLET | Freq: Three times a day (TID) | ORAL | 11 refills | Status: DC | PRN

## 2021-02-08 MED ORDER — METOCLOPRAMIDE HCL 10 MG PO TABS: 10.0000 mg | ORAL_TABLET | Freq: Three times a day (TID) | ORAL | 11 refills | Status: DC

## 2021-02-08 NOTE — Patient Instructions (Signed)
We have sent the following medications to your pharmacy for you to pick up at your convenience: Reglan 10 mg four times daily 20-30 minutes before meals and at bedtime.  Zofran 4 mg ODT 1-2 tablets every 8 hours as needed.  If you are age 62 or older, your body mass index should be between 23-30. Your Body mass index is 35.95 kg/m. If this is out of the aforementioned range listed, please consider follow up with your Primary Care Provider.  If you are age 71 or younger, your body mass index should be between 19-25. Your Body mass index is 35.95 kg/m. If this is out of the aformentioned range listed, please consider follow up with your Primary Care Provider.   __________________________________________________________  The  GI providers would like to encourage you to use [facility] to communicate with providers for non-urgent requests or questions.  Due to long hold times on the telephone, sending your provider a message by [facility] may be a faster and more efficient way to get a response.  Please allow 48 business hours for a response.  Please remember that this is for non-urgent requests.

## 2021-02-08 NOTE — Progress Notes (Signed)
Chief Complaint: Abdominal pain and nausea  HPI:    [name] is a 60 year old female with a past medical history of ESRD on HD Monday Wednesday and Friday, type 2 diabetes, GERD, hypertension (09/19/2019 echo with an LVEF 55-60% and no aortic stenosis), hyperlipidemia and others listed below, known to Dr. Hilarie Fredrickson, who presents to clinic today for abdominal pain and nausea.    05/14/2011 colonoscopy to the terminal ileum normal with exception of mild diverticulosis in the sigmoid colon.    06/29/2019 patient was seen by Dr. Hilarie Fredrickson in clinic for intermittent nausea and vomiting.  Described that we had seen her for this issue in the past, July 2017 and we performed an upper endoscopy which was normal, gastric biopsy showed mild chronic inactive gastritis without H. pylori.  Normal gastric emptying study in July 2017.  Described continued episodic nausea and vomiting was hospitalized in December 2020, prior to this had a CT scan of the abdomen pelvis as well as an ultrasound the right upper quadrant.  At that time reported intermittent issues with nausea and vomiting.  Constipation was a big problem.  At time he was most suspicious for intermittent gastroparesis.  She was given a copy of the gastroparesis diet.  Given a trial of Metoclopramide 5 mg 3 times daily before meals and at bedtime.  Also provided Zofran ODT 4 mg.  Repeat colonoscopy recommended in January 2023 for screening.    12/18/2020 CMP with a normal potassium, creatinine elevated at 4.86.  CBC with a normal white count hemoglobin 11.0 which appears a little bit higher than her baseline.    01/22/2021 patient described off-and-on left-sided abdominal pain for several months associated with nausea.  Pain typically after she eats.    Today, patient presents to clinic and tells me that about 3 weeks ago she ran out of Reglan and Zofran and since then has had increased nausea with some vomiting on occasion and a left upper quadrant discomfort and a lot  of "gurgling" in her stomach.  Tells me she has also trended towards constipation and is now using Lactulose 20 g 3 times a day as needed.  Does tell me that even when she was taking the Reglan she still needed the Zofran 3 times a day.  She felt like it helped a little bit but it did not completely get rid of her symptoms.    Patient's daughter is getting married on November 11.     Denies fever, chills, weight loss, blood in her stool or symptoms that awaken her from sleep.  Past Medical History:  Diagnosis Date   Accelerated hypertension    Aortic atherosclerosis (HCC)    Arthritis of knee    bilateral   Diabetes mellitus    type 2 - no meds   Diabetic gastroparesis ([place])    Diverticulosis    ESRD (end stage renal disease) ([place])    Fibroid uterus    GERD (gastroesophageal reflux disease)    Hyperlipidemia    Hypertension    HYPERTENSION, BENIGN SYSTEMIC 07/03/2006         Hypertensive emergency 02/05/2019   Hypokalemia    IDA (iron deficiency anemia)    Nausea    Renal disorder    Umbilical hernia    Wears dentures     Past Surgical History:  Procedure Laterality Date   AV FISTULA PLACEMENT Left 02/17/2019   Procedure: ARTERIOVENOUS (AV) FISTULA CREATION LEFT UPPER ARM;  Surgeon: Waynetta Sandy, MD;  Location:  [facility];  Service: Vascular;  Laterality: Left;   CATARACT EXTRACTION     right eye   CESAREAN SECTION     x2   CHOLECYSTECTOMY     laparoscopic   FISTULA SUPERFICIALIZATION Left 04/06/2019   Procedure: FISTULA SUPERFICIALIZATION LEFT BRACHIOCEPHALIC;  Surgeon: Waynetta Sandy, MD;  Location: [facility];  Service: Vascular;  Laterality: Left;  Transposition left arm brachiocephalic fistula.   IR FLUORO GUIDE CV LINE RIGHT  02/09/2019   IR FLUORO GUIDE CV LINE RIGHT  03/05/2019   IR US GUIDE VASC ACCESS RIGHT  02/09/2019   MULTIPLE TOOTH EXTRACTIONS     REDUCTION MAMMAPLASTY Bilateral     Current Outpatient Medications  Medication Sig Dispense Refill    Accu-Chek Softclix Lancets lancets Use as instructed (Patient taking differently: 1 each by Other route See admin instructions. Use as instructed) 100 each 12   acetaminophen (TYLENOL) 500 MG tablet Take 1,000 mg by mouth every 6 (six) hours as needed for mild pain or headache. (Patient not taking: Reported on 01/30/2021)     aspirin 325 MG tablet Take 325 mg by mouth daily.     Blood Glucose Monitoring Suppl (ACCU-CHEK AVIVA PLUS) w/Device KIT Use to check sugar three times a day (Patient taking differently: 1 each by Other route in the morning, at noon, and at bedtime.) 1 kit 0   Darbepoetin Alfa (ARANESP) 200 MCG/0.4ML SOSY injection Inject 0.4 mLs (200 mcg total) into the vein every Wednesday with hemodialysis. 1.68 mL    ferric citrate (AURYXIA) 1 GM 210 MG(Fe) tablet Take 2 tablets (420 mg total) by mouth 3 (three) times daily with meals. (Patient not taking: No sig reported) 270 tablet 0   glucose blood (ACCU-CHEK AVIVA PLUS) test strip Use as instructed (Patient taking differently: 1 each by Other route See admin instructions. Use as instructed) 100 each 12   Lancets (ACCU-CHEK SOFT TOUCH) lancets Use to check sugars three times a day (Patient taking differently: 1 each by Other route in the morning, at noon, and at bedtime.) 100 each 12   rosuvastatin (CRESTOR) 5 MG tablet Take 1 tablet (5 mg total) by mouth daily. 90 tablet 3   No current facility-administered medications for this visit.    Allergies as of 02/08/2021 - Review Complete 01/30/2021  Allergen Reaction Noted   Lisinopril Anaphylaxis and Other (See Comments) 07/04/2006   Venofer  [ferric oxide]  05/14/2019   Camellia Swelling and Other (See Comments) 03/04/2019   Jardiance [empagliflozin] Swelling and Rash 07/07/2018    Family History  Problem Relation Age of Onset   Hypertension Mother    Diabetes Mother    Cancer Father        lung   Colon cancer Neg Hx    Stomach cancer Neg Hx    Esophageal cancer Neg Hx      Social History   Socioeconomic History   Marital status: Legally Separated    Spouse name: Not on file   Number of children: 2   Years of education: Not on file   Highest education level: Not on file  Occupational History   Occupation: housekeeping  Tobacco Use   Smoking status: Former    Years: 10.00    Types: Cigarettes    Quit date: 10/16/2011    Years since quitting: 9.3   Smokeless tobacco: Never   Tobacco comments:    1 pack will last a week  Vaping Use   Vaping Use: Never used  Substance and  Sexual Activity   Alcohol use: No    Alcohol/week: 0.0 standard drinks   Drug use: No   Sexual activity: Not Currently  Other Topics Concern   Not on file  Social History Narrative   Not on file   Social Determinants of Health   Financial Resource Strain: Not on file  Food Insecurity: Not on file  Transportation Needs: Not on file  Physical Activity: Not on file  Stress: Not on file  Social Connections: Not on file  Intimate Partner Violence: Not on file    Review of Systems:    Constitutional: No weight loss, fever or chills Cardiovascular: No chest pain Respiratory: No SOB  Gastrointestinal: See HPI and otherwise negative   Physical Exam:  Vital signs: BP (!) 156/60 (BP Location: Left Arm, Patient Position: Sitting, Cuff Size: Normal)   Pulse 88   Ht 5' 7.25" (1.708 m) Comment: height measured without shoes  Wt 231 lb 4 oz (104.9 kg)   LMP  (LMP Unknown)   BMI 35.95 kg/m    Constitutional:   Pleasant AA female appears to be in NAD, Well developed, Well nourished, alert and cooperative Respiratory: Respirations even and unlabored. Lungs clear to auscultation bilaterally.   No wheezes, crackles, or rhonchi.  Cardiovascular: Normal S1, S2. No MRG. Regular rate and rhythm. No peripheral edema, cyanosis or pallor.  Gastrointestinal:  Soft, nondistended, nontender. No rebound or guarding. Normal bowel sounds. No appreciable masses or hepatomegaly. Rectal:  Not  performed.  Psychiatric: Demonstrates good judgement and reason without abnormal affect or behaviors.  RELEVANT LABS AND IMAGING: CBC    Component Value Date/Time   WBC 8.9 12/18/2020 1242   RBC 3.95 12/18/2020 1242   HGB 11.0 (L) 12/18/2020 1242   HGB 11.4 11/12/2018 0949   HCT 35.2 (L) 12/18/2020 1242   HCT 34.6 11/12/2018 0949   PLT 288 12/18/2020 1242   PLT 404 11/12/2018 0949   MCV 89.1 12/18/2020 1242   MCV 78 (L) 11/12/2018 0949   MCH 27.8 12/18/2020 1242   MCHC 31.3 12/18/2020 1242   RDW 14.6 12/18/2020 1242   RDW 15.0 11/12/2018 0949   LYMPHSABS 0.6 (L) 10/07/2019 1121   MONOABS 0.2 10/07/2019 1121   EOSABS 0.0 10/07/2019 1121   BASOSABS 0.0 10/07/2019 1121    CMP     Component Value Date/Time   NA 139 12/18/2020 1242   NA 145 (H) 11/12/2018 0949   K 3.5 12/18/2020 1242   CL 95 (L) 12/18/2020 1242   CO2 31 12/18/2020 1242   GLUCOSE 83 12/18/2020 1242   BUN 22 12/18/2020 1242   BUN 34 (H) 11/12/2018 0949   CREATININE 4.86 (H) 12/18/2020 1242   CREATININE 1.19 (H) 09/28/2015 1524   CALCIUM 9.1 12/18/2020 1242   CALCIUM 8.8 02/12/2019 0318   PROT 8.0 12/18/2020 1242   PROT 7.1 08/06/2017 1450   ALBUMIN 3.7 12/18/2020 1242   ALBUMIN 3.7 08/06/2017 1450   AST 14 (L) 12/18/2020 1242   ALT 13 12/18/2020 1242   ALKPHOS 83 12/18/2020 1242   BILITOT 0.4 12/18/2020 1242   BILITOT 0.2 08/06/2017 1450   GFRNONAA 10 (L) 12/18/2020 1242   GFRNONAA 51 (L) 09/28/2015 1524   GFRAA 4 (L) 10/11/2019 1347   GFRAA 59 (L) 09/28/2015 1524    Assessment: 1.  Intermittent nausea with vomiting: Was doing some better with Reglan 5 mg 4 times daily and Zofran 4 mg 3 times daily but ran out of these medications about 3 weeks  ago and had increase in symptoms with some left upper quadrant pain; thought related to intermittent gastroparesis in the past with a previously negative work-up including endoscopic and cross-sectional imaging with CT 2.  Chronic constipation: Better with  Lactulose 3 times daily as needed 3.  Screening for colorectal cancer: Patient be due for screening in January 2023  Plan: 1.  Refilled patient's Metoclopramide but increased to 10 mg 4 times daily, 20 to 30 minutes before meals and at bedtime #120 with 11 refills 2.  Refilled patient's Zofran 4 mg 1-2 tabs every 8 hours as needed.  #90 with 11 refills. 3.  Patient be due for screening colonoscopy come January timeframe.  We will ensure she is in recall at time of follow-up. 4.  Patient to follow in clinic with me in 3 to 4 weeks.  We are trying to get her in 1 more time to make sure she feels better for her daughter's wedding on November 11.  If she feels better prior to that she can cancel appointment.  Ellouise Newer, PA-C [facility] 02/08/2021, 1:59 PM  Cc: Lurline Del, DO

## 2021-02-09 DIAGNOSIS — N2581 Secondary hyperparathyroidism of renal origin: Secondary | ICD-10-CM | POA: Diagnosis not present

## 2021-02-09 DIAGNOSIS — Z992 Dependence on renal dialysis: Secondary | ICD-10-CM | POA: Diagnosis not present

## 2021-02-09 DIAGNOSIS — N186 End stage renal disease: Secondary | ICD-10-CM | POA: Diagnosis not present

## 2021-02-09 DIAGNOSIS — E119 Type 2 diabetes mellitus without complications: Secondary | ICD-10-CM | POA: Diagnosis not present

## 2021-02-09 DIAGNOSIS — D689 Coagulation defect, unspecified: Secondary | ICD-10-CM | POA: Diagnosis not present

## 2021-02-09 DIAGNOSIS — E876 Hypokalemia: Secondary | ICD-10-CM | POA: Diagnosis not present

## 2021-02-09 DIAGNOSIS — D631 Anemia in chronic kidney disease: Secondary | ICD-10-CM | POA: Diagnosis not present

## 2021-02-09 DIAGNOSIS — D509 Iron deficiency anemia, unspecified: Secondary | ICD-10-CM | POA: Diagnosis not present

## 2021-02-12 DIAGNOSIS — E876 Hypokalemia: Secondary | ICD-10-CM | POA: Diagnosis not present

## 2021-02-12 DIAGNOSIS — E119 Type 2 diabetes mellitus without complications: Secondary | ICD-10-CM | POA: Diagnosis not present

## 2021-02-12 DIAGNOSIS — N186 End stage renal disease: Secondary | ICD-10-CM | POA: Diagnosis not present

## 2021-02-12 DIAGNOSIS — D689 Coagulation defect, unspecified: Secondary | ICD-10-CM | POA: Diagnosis not present

## 2021-02-12 DIAGNOSIS — D631 Anemia in chronic kidney disease: Secondary | ICD-10-CM | POA: Diagnosis not present

## 2021-02-12 DIAGNOSIS — D509 Iron deficiency anemia, unspecified: Secondary | ICD-10-CM | POA: Diagnosis not present

## 2021-02-12 DIAGNOSIS — Z992 Dependence on renal dialysis: Secondary | ICD-10-CM | POA: Diagnosis not present

## 2021-02-12 DIAGNOSIS — N2581 Secondary hyperparathyroidism of renal origin: Secondary | ICD-10-CM | POA: Diagnosis not present

## 2021-02-14 DIAGNOSIS — D631 Anemia in chronic kidney disease: Secondary | ICD-10-CM | POA: Diagnosis not present

## 2021-02-14 DIAGNOSIS — N2581 Secondary hyperparathyroidism of renal origin: Secondary | ICD-10-CM | POA: Diagnosis not present

## 2021-02-14 DIAGNOSIS — E119 Type 2 diabetes mellitus without complications: Secondary | ICD-10-CM | POA: Diagnosis not present

## 2021-02-14 DIAGNOSIS — D689 Coagulation defect, unspecified: Secondary | ICD-10-CM | POA: Diagnosis not present

## 2021-02-14 DIAGNOSIS — Z992 Dependence on renal dialysis: Secondary | ICD-10-CM | POA: Diagnosis not present

## 2021-02-14 DIAGNOSIS — N186 End stage renal disease: Secondary | ICD-10-CM | POA: Diagnosis not present

## 2021-02-14 DIAGNOSIS — E876 Hypokalemia: Secondary | ICD-10-CM | POA: Diagnosis not present

## 2021-02-14 DIAGNOSIS — D509 Iron deficiency anemia, unspecified: Secondary | ICD-10-CM | POA: Diagnosis not present

## 2021-02-16 DIAGNOSIS — N186 End stage renal disease: Secondary | ICD-10-CM | POA: Diagnosis not present

## 2021-02-16 DIAGNOSIS — D689 Coagulation defect, unspecified: Secondary | ICD-10-CM | POA: Diagnosis not present

## 2021-02-16 DIAGNOSIS — D509 Iron deficiency anemia, unspecified: Secondary | ICD-10-CM | POA: Diagnosis not present

## 2021-02-16 DIAGNOSIS — E876 Hypokalemia: Secondary | ICD-10-CM | POA: Diagnosis not present

## 2021-02-16 DIAGNOSIS — Z992 Dependence on renal dialysis: Secondary | ICD-10-CM | POA: Diagnosis not present

## 2021-02-16 DIAGNOSIS — N2581 Secondary hyperparathyroidism of renal origin: Secondary | ICD-10-CM | POA: Diagnosis not present

## 2021-02-16 DIAGNOSIS — E119 Type 2 diabetes mellitus without complications: Secondary | ICD-10-CM | POA: Diagnosis not present

## 2021-02-16 DIAGNOSIS — D631 Anemia in chronic kidney disease: Secondary | ICD-10-CM | POA: Diagnosis not present

## 2021-02-19 DIAGNOSIS — E119 Type 2 diabetes mellitus without complications: Secondary | ICD-10-CM | POA: Diagnosis not present

## 2021-02-19 DIAGNOSIS — D631 Anemia in chronic kidney disease: Secondary | ICD-10-CM | POA: Diagnosis not present

## 2021-02-19 DIAGNOSIS — D509 Iron deficiency anemia, unspecified: Secondary | ICD-10-CM | POA: Diagnosis not present

## 2021-02-19 DIAGNOSIS — N186 End stage renal disease: Secondary | ICD-10-CM | POA: Diagnosis not present

## 2021-02-19 DIAGNOSIS — N2581 Secondary hyperparathyroidism of renal origin: Secondary | ICD-10-CM | POA: Diagnosis not present

## 2021-02-19 DIAGNOSIS — Z992 Dependence on renal dialysis: Secondary | ICD-10-CM | POA: Diagnosis not present

## 2021-02-19 DIAGNOSIS — D689 Coagulation defect, unspecified: Secondary | ICD-10-CM | POA: Diagnosis not present

## 2021-02-19 DIAGNOSIS — E876 Hypokalemia: Secondary | ICD-10-CM | POA: Diagnosis not present

## 2021-02-21 DIAGNOSIS — D689 Coagulation defect, unspecified: Secondary | ICD-10-CM | POA: Diagnosis not present

## 2021-02-21 DIAGNOSIS — Z992 Dependence on renal dialysis: Secondary | ICD-10-CM | POA: Diagnosis not present

## 2021-02-21 DIAGNOSIS — E876 Hypokalemia: Secondary | ICD-10-CM | POA: Diagnosis not present

## 2021-02-21 DIAGNOSIS — N2581 Secondary hyperparathyroidism of renal origin: Secondary | ICD-10-CM | POA: Diagnosis not present

## 2021-02-21 DIAGNOSIS — D631 Anemia in chronic kidney disease: Secondary | ICD-10-CM | POA: Diagnosis not present

## 2021-02-21 DIAGNOSIS — N186 End stage renal disease: Secondary | ICD-10-CM | POA: Diagnosis not present

## 2021-02-21 DIAGNOSIS — D509 Iron deficiency anemia, unspecified: Secondary | ICD-10-CM | POA: Diagnosis not present

## 2021-02-21 DIAGNOSIS — E119 Type 2 diabetes mellitus without complications: Secondary | ICD-10-CM | POA: Diagnosis not present

## 2021-02-22 DIAGNOSIS — N2581 Secondary hyperparathyroidism of renal origin: Secondary | ICD-10-CM | POA: Diagnosis not present

## 2021-02-22 DIAGNOSIS — Z992 Dependence on renal dialysis: Secondary | ICD-10-CM | POA: Diagnosis not present

## 2021-02-22 DIAGNOSIS — D631 Anemia in chronic kidney disease: Secondary | ICD-10-CM | POA: Diagnosis not present

## 2021-02-22 DIAGNOSIS — D509 Iron deficiency anemia, unspecified: Secondary | ICD-10-CM | POA: Diagnosis not present

## 2021-02-22 DIAGNOSIS — N186 End stage renal disease: Secondary | ICD-10-CM | POA: Diagnosis not present

## 2021-02-22 DIAGNOSIS — D689 Coagulation defect, unspecified: Secondary | ICD-10-CM | POA: Diagnosis not present

## 2021-02-22 DIAGNOSIS — E119 Type 2 diabetes mellitus without complications: Secondary | ICD-10-CM | POA: Diagnosis not present

## 2021-02-22 DIAGNOSIS — E876 Hypokalemia: Secondary | ICD-10-CM | POA: Diagnosis not present

## 2021-02-25 NOTE — Progress Notes (Deleted)
Cardiology Office Note:   Date:  02/25/2021  NAME:  [name]    MRN: [id number] DOB:  06/05/1960   PCP:  Lurline Del, DO  Cardiologist:  None  Electrophysiologist:  None   Referring MD: Lurline Del, DO   No chief complaint on file. ***  History of Present Illness:   [name] is a 60 y.o. female with a hx of DM, HTN, ESRD on HD, HLD who is being seen today for the evaluation of chest pain at the request of Lurline Del, DO. Seen in ER 12/18/2020 for chest pain. Troponin negative.   Problem List DM -A1c 5.4 ESRD on HD HTN HLD -T chol 222, HDL 60, LDL 147, TG 86  Past Medical History: Past Medical History:  Diagnosis Date   Accelerated hypertension    Aortic atherosclerosis (HCC)    Arthritis of knee    bilateral   Diabetes mellitus    type 2 - no meds   Diabetic gastroparesis ([place])    Diverticulosis    ESRD (end stage renal disease) ([place])    Fibroid uterus    GERD (gastroesophageal reflux disease)    Hyperlipidemia    Hypertension    HYPERTENSION, BENIGN SYSTEMIC 07/03/2006         Hypertensive emergency 02/05/2019   Hypokalemia    IDA (iron deficiency anemia)    Nausea    Renal disorder    Umbilical hernia    Wears dentures     Past Surgical History: Past Surgical History:  Procedure Laterality Date   AV FISTULA PLACEMENT Left 02/17/2019   Procedure: ARTERIOVENOUS (AV) FISTULA CREATION LEFT UPPER ARM;  Surgeon: Waynetta Sandy, MD;  Location: [facility];  Service: Vascular;  Laterality: Left;   CATARACT EXTRACTION     right eye   CESAREAN SECTION     x2   CHOLECYSTECTOMY     laparoscopic   FISTULA SUPERFICIALIZATION Left 04/06/2019   Procedure: FISTULA SUPERFICIALIZATION LEFT BRACHIOCEPHALIC;  Surgeon: Waynetta Sandy, MD;  Location: [facility];  Service: Vascular;  Laterality: Left;  Transposition left arm brachiocephalic fistula.   IR FLUORO GUIDE CV LINE RIGHT  02/09/2019   IR FLUORO GUIDE CV LINE RIGHT  03/05/2019   IR US GUIDE  VASC ACCESS RIGHT  02/09/2019   MULTIPLE TOOTH EXTRACTIONS     REDUCTION MAMMAPLASTY Bilateral     Current Medications: No outpatient medications have been marked as taking for the 03/01/21 encounter (Appointment) with Geralynn Rile, MD.     Allergies:    Lisinopril, Venofer  [ferric oxide], Camellia, and Jardiance [empagliflozin]   Social History: Social History   Socioeconomic History   Marital status: Legally Separated    Spouse name: Not on file   Number of children: 2   Years of education: Not on file   Highest education level: Not on file  Occupational History   Occupation: housekeeping  Tobacco Use   Smoking status: Former    Years: 10.00    Types: Cigarettes    Quit date: 10/16/2011    Years since quitting: 9.3   Smokeless tobacco: Never   Tobacco comments:    1 pack will last a week  Vaping Use   Vaping Use: Never used  Substance and Sexual Activity   Alcohol use: No    Alcohol/week: 0.0 standard drinks   Drug use: No   Sexual activity: Not Currently  Other Topics Concern   Not on file  Social History Narrative   Not  on file   Social Determinants of Health   Financial Resource Strain: Not on file  Food Insecurity: Not on file  Transportation Needs: Not on file  Physical Activity: Not on file  Stress: Not on file  Social Connections: Not on file     Family History: The patient's ***family history includes Cancer in her father; Diabetes in her mother; Hypertension in her mother. There is no history of Colon cancer, Stomach cancer, or Esophageal cancer.  ROS:   All other ROS reviewed and negative. Pertinent positives noted in the HPI.     EKGs/Labs/Other Studies Reviewed:   The following studies were personally reviewed by me today:  EKG:  EKG is *** ordered today.  The ekg ordered today demonstrates ***, and was personally reviewed by me.   Recent Labs: 12/18/2020: ALT 13; BUN 22; Creatinine, Ser 4.86; Hemoglobin 11.0; Platelets 288;  Potassium 3.5; Sodium 139   Recent Lipid Panel    Component Value Date/Time   CHOL 222 (H) 01/30/2021 1039   TRIG 86 01/30/2021 1039   HDL 60 01/30/2021 1039   CHOLHDL 3.7 01/30/2021 1039   CHOLHDL 4.1 09/19/2019 0359   VLDL 21 09/19/2019 0359   LDLCALC 147 (H) 01/30/2021 1039    Physical Exam:   VS:  LMP  (LMP Unknown)    Wt Readings from Last 3 Encounters:  02/08/21 231 lb 4 oz (104.9 kg)  01/30/21 225 lb (102.1 kg)  12/18/20 238 lb 5.1 oz (108.1 kg)    General: Well nourished, well developed, in no acute distress Head: Atraumatic, normal size  Eyes: PEERLA, EOMI  Neck: Supple, no JVD Endocrine: No thryomegaly Cardiac: Normal S1, S2; RRR; no murmurs, rubs, or gallops Lungs: Clear to auscultation bilaterally, no wheezing, rhonchi or rales  Abd: Soft, nontender, no hepatomegaly  Ext: No edema, pulses 2+ Musculoskeletal: No deformities, BUE and BLE strength normal and equal Skin: Warm and dry, no rashes   Neuro: Alert and oriented to person, place, time, and situation, CNII-XII grossly intact, no focal deficits  Psych: Normal mood and affect   ASSESSMENT:   [name] is a 60 y.o. female who presents for the following: No diagnosis found.  PLAN:   There are no diagnoses linked to this encounter.  {Are you ordering a CV Procedure (e.g. stress test, cath, DCCV, TEE, etc)?   Press F2        :[id number]}  Disposition: No follow-ups on file.  Medication Adjustments/Labs and Tests Ordered: Current medicines are reviewed at length with the patient today.  Concerns regarding medicines are outlined above.  No orders of the defined types were placed in this encounter.  No orders of the defined types were placed in this encounter.   There are no Patient Instructions on file for this visit.   Time Spent with Patient: I have spent a total of *** minutes with patient reviewing hospital notes, telemetry, EKGs, labs and examining the patient as well as establishing an  assessment and plan that was discussed with the patient.  > 50% of time was spent in direct patient care.  Signed, Addison Naegeli. Audie Box, MD, [place]  [address], [place], [place] [address] [phone]  02/25/2021 5:13 PM

## 2021-02-26 ENCOUNTER — Other Ambulatory Visit: Payer: Medicare Other

## 2021-02-26 DIAGNOSIS — N186 End stage renal disease: Secondary | ICD-10-CM | POA: Diagnosis not present

## 2021-02-26 DIAGNOSIS — D509 Iron deficiency anemia, unspecified: Secondary | ICD-10-CM | POA: Diagnosis not present

## 2021-02-26 DIAGNOSIS — D689 Coagulation defect, unspecified: Secondary | ICD-10-CM | POA: Diagnosis not present

## 2021-02-26 DIAGNOSIS — E876 Hypokalemia: Secondary | ICD-10-CM | POA: Diagnosis not present

## 2021-02-26 DIAGNOSIS — Z992 Dependence on renal dialysis: Secondary | ICD-10-CM | POA: Diagnosis not present

## 2021-02-26 DIAGNOSIS — D631 Anemia in chronic kidney disease: Secondary | ICD-10-CM | POA: Diagnosis not present

## 2021-02-26 DIAGNOSIS — E119 Type 2 diabetes mellitus without complications: Secondary | ICD-10-CM | POA: Diagnosis not present

## 2021-02-26 DIAGNOSIS — N2581 Secondary hyperparathyroidism of renal origin: Secondary | ICD-10-CM | POA: Diagnosis not present

## 2021-02-28 ENCOUNTER — Other Ambulatory Visit (HOSPITAL_COMMUNITY): Payer: Self-pay | Admitting: Nephrology

## 2021-02-28 DIAGNOSIS — Z01810 Encounter for preprocedural cardiovascular examination: Secondary | ICD-10-CM

## 2021-02-28 DIAGNOSIS — Z992 Dependence on renal dialysis: Secondary | ICD-10-CM | POA: Diagnosis not present

## 2021-02-28 DIAGNOSIS — D689 Coagulation defect, unspecified: Secondary | ICD-10-CM | POA: Diagnosis not present

## 2021-02-28 DIAGNOSIS — E119 Type 2 diabetes mellitus without complications: Secondary | ICD-10-CM | POA: Diagnosis not present

## 2021-02-28 DIAGNOSIS — D631 Anemia in chronic kidney disease: Secondary | ICD-10-CM | POA: Diagnosis not present

## 2021-02-28 DIAGNOSIS — N186 End stage renal disease: Secondary | ICD-10-CM | POA: Diagnosis not present

## 2021-02-28 DIAGNOSIS — N2581 Secondary hyperparathyroidism of renal origin: Secondary | ICD-10-CM | POA: Diagnosis not present

## 2021-02-28 DIAGNOSIS — D509 Iron deficiency anemia, unspecified: Secondary | ICD-10-CM | POA: Diagnosis not present

## 2021-02-28 DIAGNOSIS — E876 Hypokalemia: Secondary | ICD-10-CM | POA: Diagnosis not present

## 2021-02-28 NOTE — Progress Notes (Signed)
Addendum: Reviewed and agree with assessment and management plan. Otto Felkins M, MD  

## 2021-03-01 ENCOUNTER — Ambulatory Visit: Payer: Medicare Other | Admitting: Cardiovascular Disease

## 2021-03-01 DIAGNOSIS — R072 Precordial pain: Secondary | ICD-10-CM

## 2021-03-01 DIAGNOSIS — E782 Mixed hyperlipidemia: Secondary | ICD-10-CM

## 2021-03-02 ENCOUNTER — Telehealth (HOSPITAL_COMMUNITY): Payer: Self-pay | Admitting: *Deleted

## 2021-03-02 ENCOUNTER — Ambulatory Visit (INDEPENDENT_AMBULATORY_CARE_PROVIDER_SITE_OTHER): Payer: Medicare Other | Admitting: Cardiovascular Disease

## 2021-03-02 ENCOUNTER — Encounter: Payer: Self-pay | Admitting: Cardiovascular Disease

## 2021-03-02 ENCOUNTER — Other Ambulatory Visit: Payer: Self-pay

## 2021-03-02 VITALS — BP 152/78 | HR 94 | Ht 69.0 in | Wt 225.0 lb

## 2021-03-02 DIAGNOSIS — D509 Iron deficiency anemia, unspecified: Secondary | ICD-10-CM | POA: Diagnosis not present

## 2021-03-02 DIAGNOSIS — Z992 Dependence on renal dialysis: Secondary | ICD-10-CM | POA: Diagnosis not present

## 2021-03-02 DIAGNOSIS — N2581 Secondary hyperparathyroidism of renal origin: Secondary | ICD-10-CM | POA: Diagnosis not present

## 2021-03-02 DIAGNOSIS — R072 Precordial pain: Secondary | ICD-10-CM

## 2021-03-02 DIAGNOSIS — E876 Hypokalemia: Secondary | ICD-10-CM | POA: Diagnosis not present

## 2021-03-02 DIAGNOSIS — E119 Type 2 diabetes mellitus without complications: Secondary | ICD-10-CM | POA: Diagnosis not present

## 2021-03-02 DIAGNOSIS — D689 Coagulation defect, unspecified: Secondary | ICD-10-CM | POA: Diagnosis not present

## 2021-03-02 DIAGNOSIS — N186 End stage renal disease: Secondary | ICD-10-CM | POA: Diagnosis not present

## 2021-03-02 DIAGNOSIS — D631 Anemia in chronic kidney disease: Secondary | ICD-10-CM | POA: Diagnosis not present

## 2021-03-02 NOTE — Telephone Encounter (Signed)
Close encounter 

## 2021-03-02 NOTE — Progress Notes (Signed)
Cardiology Office Note:   Date:  03/02/2021  NAME:  [name]    MRN: [id number] DOB:  [birth year]   PCP:  Carolyn Del, DO  Cardiologist:  None  Electrophysiologist:  None   Referring MD: Carolyn Del, DO   Chief Complaint  Patient presents with   Chest Pain   History of Present Illness:   [name] is a 60 y.o. female with a hx of HTN, ESRD on HD, DM, CVA, HLD who is being seen today for the evaluation of chest pain at the request of Carolyn Del, DO.  She reports she had chest tightness during dialysis on 12/18/2020.  She went to the emergency room where her EKG was normal and troponins were negative.  She was sent home.  She has had no further episodes of chest tightness at dialysis.  She reports no exertional chest pressure or shortness of breath.  Blood pressure is elevated today.  It seems to run on the high side.  She had an echocardiogram last year that was normal.  Cardiovascular examination is normal today.  Her EKG in office demonstrates normal sinus rhythm with no evidence of acute ischemic changes or evidence of infarction.  I did review a CT scan of her chest in 2020.  This shows no evidence of coronary calcium.  It is reassuring that she is had no further episodes.  Most recent lipid profile shows an LDL of 147.  Her most recent A1c is 5.4.  She is been on dialysis for 1 year.  She is a former smoker of roughly 20 years.  She does not use drugs or drink alcohol.  She is currently disabled.  She has several children and several grandchildren.  Denies any further chest pain episodes.  She was seen by her nephrologist and has a stress test planned for Tuesday.  It is reassuring she is had no further episodes.  Problem List ESRD on HD HTN DM -A1c 5.4 CVA -small vessel disease  HLD -T chol 222, HDL 60, LDL 147, TG 86  Past Medical History: Past Medical History:  Diagnosis Date   Accelerated hypertension    Aortic atherosclerosis (HCC)    Arthritis of knee     bilateral   Diabetes mellitus    type 2 - no meds   Diabetic gastroparesis (HCC)    Diverticulosis    ESRD (end stage renal disease) ([facility])    Fibroid uterus    GERD (gastroesophageal reflux disease)    Hyperlipidemia    Hypertension    HYPERTENSION, BENIGN SYSTEMIC 07/03/2006         Hypertensive emergency 02/05/2019   Hypokalemia    IDA (iron deficiency anemia)    Nausea    Renal disorder    Umbilical hernia    Wears dentures     Past Surgical History: Past Surgical History:  Procedure Laterality Date   AV FISTULA PLACEMENT Left 02/17/2019   Procedure: ARTERIOVENOUS (AV) FISTULA CREATION LEFT UPPER ARM;  Surgeon: Waynetta Sandy, MD;  Location: [facility];  Service: Vascular;  Laterality: Left;   CATARACT EXTRACTION     right eye   CESAREAN SECTION     x2   CHOLECYSTECTOMY     laparoscopic   FISTULA SUPERFICIALIZATION Left 04/06/2019   Procedure: FISTULA SUPERFICIALIZATION LEFT BRACHIOCEPHALIC;  Surgeon: Waynetta Sandy, MD;  Location: [facility];  Service: Vascular;  Laterality: Left;  Transposition left arm brachiocephalic fistula.   IR FLUORO GUIDE CV LINE RIGHT  02/09/2019  IR FLUORO GUIDE CV LINE RIGHT  03/05/2019   IR US GUIDE VASC ACCESS RIGHT  02/09/2019   MULTIPLE TOOTH EXTRACTIONS     REDUCTION MAMMAPLASTY Bilateral     Current Medications: Current Meds  Medication Sig   Accu-Chek Softclix Lancets lancets Use as instructed (Patient taking differently: 1 each by Other route See admin instructions. Use as instructed)   aspirin 325 MG tablet Take 325 mg by mouth daily.   Blood Glucose Monitoring Suppl (ACCU-CHEK AVIVA PLUS) w/Device KIT Use to check sugar three times a day (Patient taking differently: 1 each by Other route in the morning, at noon, and at bedtime.)   Darbepoetin Alfa (ARANESP) 200 MCG/0.4ML SOSY injection Inject 0.4 mLs (200 mcg total) into the vein every Wednesday with hemodialysis.   glucose blood (ACCU-CHEK AVIVA PLUS) test strip Use  as instructed (Patient taking differently: 1 each by Other route See admin instructions. Use as instructed)   lactulose (CHRONULAC) 10 GM/15ML solution Take 20 g by mouth 3 (three) times daily as needed for mild constipation.   Lancets (ACCU-CHEK SOFT TOUCH) lancets Use to check sugars three times a day (Patient taking differently: 1 each by Other route in the morning, at noon, and at bedtime.)   metoCLOPramide (REGLAN) 10 MG tablet Take 1 tablet (10 mg total) by mouth 4 (four) times daily -  before meals and at bedtime.   ondansetron (ZOFRAN ODT) 4 MG disintegrating tablet Take 1-2 tablets (4-8 mg total) by mouth every 8 (eight) hours as needed for nausea or vomiting.   rosuvastatin (CRESTOR) 5 MG tablet Take by mouth.     Allergies:    Lisinopril, Venofer  [ferric oxide], Camellia, and Jardiance [empagliflozin]   Social History: Social History   Socioeconomic History   Marital status: Legally Separated    Spouse name: Not on file   Number of children: 2   Years of education: Not on file   Highest education level: Not on file  Occupational History   Occupation: housekeeping   Occupation: disabled  Tobacco Use   Smoking status: Former    Years: 20.00    Types: Cigarettes    Quit date: 10/16/2011    Years since quitting: 9.3   Smokeless tobacco: Never   Tobacco comments:    1 pack will last a week  Vaping Use   Vaping Use: Never used  Substance and Sexual Activity   Alcohol use: No    Alcohol/week: 0.0 standard drinks   Drug use: No   Sexual activity: Not Currently  Other Topics Concern   Not on file  Social History Narrative   Not on file   Social Determinants of Health   Financial Resource Strain: Not on file  Food Insecurity: Not on file  Transportation Needs: Not on file  Physical Activity: Not on file  Stress: Not on file  Social Connections: Not on file     Family History: The patient's family history includes Cancer in her father; Diabetes in her mother;  Hypertension in her mother. There is no history of Colon cancer, Stomach cancer, or Esophageal cancer.  ROS:   All other ROS reviewed and negative. Pertinent positives noted in the HPI.     EKGs/Labs/Other Studies Reviewed:   The following studies were personally reviewed by me today:  EKG:  EKG is ordered today.  The ekg ordered today demonstrates normal sinus rhythm heart rate 94, no acute ischemic changes or evidence of infarction, and was personally reviewed by me.  TTE 09/19/2019  1. Left ventricular ejection fraction, by estimation, is 55 to 60%. The  left ventricle has normal function. The left ventricle has no regional  wall motion abnormalities. There is moderate asymmetric left ventricular  hypertrophy of the basal and septal  segments. Left ventricular diastolic parameters are consistent with Grade  I diastolic dysfunction (impaired relaxation).   2. Right ventricular systolic function is normal. The right ventricular  size is normal. There is normal pulmonary artery systolic pressure.   3. The mitral valve is normal in structure. No evidence of mitral valve  regurgitation. No evidence of mitral stenosis.   4. The aortic valve is tricuspid. Aortic valve regurgitation is not  visualized. Mild to moderate aortic valve sclerosis/calcification is  present, without any evidence of aortic stenosis.   5. The inferior vena cava is normal in size with greater than 50%  respiratory variability, suggesting right atrial pressure of 3 mmHg.    Recent Labs: 12/18/2020: ALT 13; BUN 22; Creatinine, Ser 4.86; Hemoglobin 11.0; Platelets 288; Potassium 3.5; Sodium 139   Recent Lipid Panel    Component Value Date/Time   CHOL 222 (H) 01/30/2021 1039   TRIG 86 01/30/2021 1039   HDL 60 01/30/2021 1039   CHOLHDL 3.7 01/30/2021 1039   CHOLHDL 4.1 09/19/2019 0359   VLDL 21 09/19/2019 0359   LDLCALC 147 (H) 01/30/2021 1039    Physical Exam:   VS:  BP (!) 152/78   Pulse 94   Ht $R'5\' 9"'Ko$  (1.753  m)   Wt 225 lb (102.1 kg)   LMP  (LMP Unknown)   SpO2 100%   BMI 33.23 kg/m    Wt Readings from Last 3 Encounters:  03/02/21 225 lb (102.1 kg)  02/08/21 231 lb 4 oz (104.9 kg)  01/30/21 225 lb (102.1 kg)    General: Well nourished, well developed, in no acute distress Head: Atraumatic, normal size  Eyes: PEERLA, EOMI  Neck: Supple, no JVD Endocrine: No thryomegaly Cardiac: Normal S1, S2; RRR; no murmurs, rubs, or gallops Lungs: Clear to auscultation bilaterally, no wheezing, rhonchi or rales  Abd: Soft, nontender, no hepatomegaly  Ext: No edema, pulses 2+ Musculoskeletal: No deformities, BUE and BLE strength normal and equal Skin: Warm and dry, no rashes   Neuro: Alert and oriented to person, place, time, and situation, CNII-XII grossly intact, no focal deficits  Psych: Normal mood and affect   ASSESSMENT:   [name] is a 60 y.o. female who presents for the following: 1. Precordial pain     PLAN:   1. Precordial pain -She reports an episode of chest pressure with dialysis.  Seen in the emergency room on 12/18/2020.  Troponins were negative.  EKG was normal.  She is returned to dialysis and had no further symptoms.  Her EKG in office is normal.  She underwent an echocardiogram last year as well as an exercise stress echo.  This was normal in 2021.  I have reviewed her CT scan of her chest in 2020 where she had no evidence of coronary calcium.  To me her symptoms likely represent noncardiac chest pain.  Unclear if this was acid reflux or something else.  She will undergo a stress test next week as ordered by her nephrologist.  We will follow-up the results of this.  However given that she has no coronary calcium on a CT of her chest I believe she is low risk for having obstructive CAD.  She is only been on dialysis for  1 year and her diabetes is well controlled.  We will see her back as needed.  I will follow-up the results of her stress test.  Disposition: Return if symptoms  worsen or fail to improve.  Medication Adjustments/Labs and Tests Ordered: Current medicines are reviewed at length with the patient today.  Concerns regarding medicines are outlined above.  Orders Placed This Encounter  Procedures   EKG 12-Lead   No orders of the defined types were placed in this encounter.   Patient Instructions  Medication Instructions:  The current medical regimen is effective;  continue present plan and medications.  *If you need a refill on your cardiac medications before your next appointment, please call your pharmacy*   Follow-Up: At [facility], you and your health needs are our priority.  As part of our continuing mission to provide you with exceptional heart care, we have created designated Provider Care Teams.  These Care Teams include your primary Cardiologist (physician) and Advanced Practice Providers (APPs -  Physician Assistants and Nurse Practitioners) who all work together to provide you with the care you need, when you need it.  We recommend signing up for the patient portal called "MyChart".  Sign up information is provided on this After Visit Summary.  MyChart is used to connect with patients for Virtual Visits (Telemedicine).  Patients are able to view lab/test results, encounter notes, upcoming appointments, etc.  Non-urgent messages can be sent to your provider as well.   To learn more about what you can do with MyChart, go to [email or web address].    Your next appointment:   As needed  The format for your next appointment:   In Person  Provider:   Eleonore Chiquito, MD       Signed, Addison Naegeli. Audie Box, MD, [facility]  [address] [place], [place] [address] [phone]  03/02/2021 4:45 PM

## 2021-03-02 NOTE — Patient Instructions (Signed)
Medication Instructions:  The current medical regimen is effective;  continue present plan and medications.  *If you need a refill on your cardiac medications before your next appointment, please call your pharmacy*    Follow-Up: At [facility], you and your health needs are our priority.  As part of our continuing mission to provide you with exceptional heart care, we have created designated Provider Care Teams.  These Care Teams include your primary Cardiologist (physician) and Advanced Practice Providers (APPs -  Physician Assistants and Nurse Practitioners) who all work together to provide you with the care you need, when you need it.  We recommend signing up for the patient portal called "MyChart".  Sign up information is provided on this After Visit Summary.  MyChart is used to connect with patients for Virtual Visits (Telemedicine).  Patients are able to view lab/test results, encounter notes, upcoming appointments, etc.  Non-urgent messages can be sent to your provider as well.   To learn more about what you can do with MyChart, go to [email or web address].    Your next appointment:   As needed  The format for your next appointment:   In Person  Provider:   Snoqualmie O'Neal, MD      

## 2021-03-05 DIAGNOSIS — E876 Hypokalemia: Secondary | ICD-10-CM | POA: Diagnosis not present

## 2021-03-05 DIAGNOSIS — N2581 Secondary hyperparathyroidism of renal origin: Secondary | ICD-10-CM | POA: Diagnosis not present

## 2021-03-05 DIAGNOSIS — N186 End stage renal disease: Secondary | ICD-10-CM | POA: Diagnosis not present

## 2021-03-05 DIAGNOSIS — D509 Iron deficiency anemia, unspecified: Secondary | ICD-10-CM | POA: Diagnosis not present

## 2021-03-05 DIAGNOSIS — E119 Type 2 diabetes mellitus without complications: Secondary | ICD-10-CM | POA: Diagnosis not present

## 2021-03-05 DIAGNOSIS — Z992 Dependence on renal dialysis: Secondary | ICD-10-CM | POA: Diagnosis not present

## 2021-03-05 DIAGNOSIS — D689 Coagulation defect, unspecified: Secondary | ICD-10-CM | POA: Diagnosis not present

## 2021-03-05 DIAGNOSIS — I129 Hypertensive chronic kidney disease with stage 1 through stage 4 chronic kidney disease, or unspecified chronic kidney disease: Secondary | ICD-10-CM | POA: Diagnosis not present

## 2021-03-05 DIAGNOSIS — D631 Anemia in chronic kidney disease: Secondary | ICD-10-CM | POA: Diagnosis not present

## 2021-03-06 ENCOUNTER — Other Ambulatory Visit: Payer: Self-pay

## 2021-03-06 ENCOUNTER — Ambulatory Visit (HOSPITAL_COMMUNITY)
Admission: RE | Admit: 2021-03-06 | Discharge: 2021-03-06 | Disposition: A | Discharge status: Home / Self Care | Payer: Medicare Other | Source: Ambulatory Visit | Attending: Cardiovascular Disease | Admitting: Cardiovascular Disease

## 2021-03-06 DIAGNOSIS — Z01810 Encounter for preprocedural cardiovascular examination: Secondary | ICD-10-CM | POA: Insufficient documentation

## 2021-03-06 MED ORDER — TECHNETIUM TC 99M TETROFOSMIN IV KIT
29.2000 | PACK | Freq: Once | INTRAVENOUS | Status: AC | PRN
  Administered 2021-03-06: 29.2 via INTRAVENOUS
  Filled 2021-03-06: qty 30

## 2021-03-06 MED ORDER — REGADENOSON 0.4 MG/5ML IV SOLN
0.4000 mg | Freq: Once | INTRAVENOUS | Status: AC
  Administered 2021-03-06: 0.4 mg via INTRAVENOUS

## 2021-03-08 ENCOUNTER — Ambulatory Visit (HOSPITAL_COMMUNITY): Admission: RE | Admit: 2021-03-08 | Payer: Medicare Other | Source: Ambulatory Visit

## 2021-03-08 ENCOUNTER — Ambulatory Visit: Payer: Medicare Other | Admitting: Physician Assistant

## 2021-03-09 ENCOUNTER — Encounter: Payer: Self-pay | Admitting: Nephrology

## 2021-03-09 DIAGNOSIS — D509 Iron deficiency anemia, unspecified: Secondary | ICD-10-CM | POA: Diagnosis not present

## 2021-03-09 DIAGNOSIS — E876 Hypokalemia: Secondary | ICD-10-CM | POA: Diagnosis not present

## 2021-03-09 DIAGNOSIS — D631 Anemia in chronic kidney disease: Secondary | ICD-10-CM | POA: Diagnosis not present

## 2021-03-09 DIAGNOSIS — D689 Coagulation defect, unspecified: Secondary | ICD-10-CM | POA: Diagnosis not present

## 2021-03-09 DIAGNOSIS — N2581 Secondary hyperparathyroidism of renal origin: Secondary | ICD-10-CM | POA: Diagnosis not present

## 2021-03-09 DIAGNOSIS — Z992 Dependence on renal dialysis: Secondary | ICD-10-CM | POA: Diagnosis not present

## 2021-03-09 DIAGNOSIS — N186 End stage renal disease: Secondary | ICD-10-CM | POA: Diagnosis not present

## 2021-03-12 DIAGNOSIS — D509 Iron deficiency anemia, unspecified: Secondary | ICD-10-CM | POA: Diagnosis not present

## 2021-03-12 DIAGNOSIS — N2581 Secondary hyperparathyroidism of renal origin: Secondary | ICD-10-CM | POA: Diagnosis not present

## 2021-03-12 DIAGNOSIS — E876 Hypokalemia: Secondary | ICD-10-CM | POA: Diagnosis not present

## 2021-03-12 DIAGNOSIS — D689 Coagulation defect, unspecified: Secondary | ICD-10-CM | POA: Diagnosis not present

## 2021-03-12 DIAGNOSIS — N186 End stage renal disease: Secondary | ICD-10-CM | POA: Diagnosis not present

## 2021-03-12 DIAGNOSIS — D631 Anemia in chronic kidney disease: Secondary | ICD-10-CM | POA: Diagnosis not present

## 2021-03-12 DIAGNOSIS — Z992 Dependence on renal dialysis: Secondary | ICD-10-CM | POA: Diagnosis not present

## 2021-03-13 ENCOUNTER — Inpatient Hospital Stay: Admission: RE | Admit: 2021-03-13 | Payer: Medicare Other | Source: Ambulatory Visit

## 2021-03-14 DIAGNOSIS — D689 Coagulation defect, unspecified: Secondary | ICD-10-CM | POA: Diagnosis not present

## 2021-03-14 DIAGNOSIS — N186 End stage renal disease: Secondary | ICD-10-CM | POA: Diagnosis not present

## 2021-03-14 DIAGNOSIS — Z992 Dependence on renal dialysis: Secondary | ICD-10-CM | POA: Diagnosis not present

## 2021-03-14 DIAGNOSIS — N2581 Secondary hyperparathyroidism of renal origin: Secondary | ICD-10-CM | POA: Diagnosis not present

## 2021-03-14 DIAGNOSIS — E876 Hypokalemia: Secondary | ICD-10-CM | POA: Diagnosis not present

## 2021-03-14 DIAGNOSIS — D631 Anemia in chronic kidney disease: Secondary | ICD-10-CM | POA: Diagnosis not present

## 2021-03-14 DIAGNOSIS — D509 Iron deficiency anemia, unspecified: Secondary | ICD-10-CM | POA: Diagnosis not present

## 2021-03-16 DIAGNOSIS — Z992 Dependence on renal dialysis: Secondary | ICD-10-CM | POA: Diagnosis not present

## 2021-03-16 DIAGNOSIS — E876 Hypokalemia: Secondary | ICD-10-CM | POA: Diagnosis not present

## 2021-03-16 DIAGNOSIS — D509 Iron deficiency anemia, unspecified: Secondary | ICD-10-CM | POA: Diagnosis not present

## 2021-03-16 DIAGNOSIS — N186 End stage renal disease: Secondary | ICD-10-CM | POA: Diagnosis not present

## 2021-03-16 DIAGNOSIS — D689 Coagulation defect, unspecified: Secondary | ICD-10-CM | POA: Diagnosis not present

## 2021-03-16 DIAGNOSIS — D631 Anemia in chronic kidney disease: Secondary | ICD-10-CM | POA: Diagnosis not present

## 2021-03-16 DIAGNOSIS — N2581 Secondary hyperparathyroidism of renal origin: Secondary | ICD-10-CM | POA: Diagnosis not present

## 2021-03-19 DIAGNOSIS — D509 Iron deficiency anemia, unspecified: Secondary | ICD-10-CM | POA: Diagnosis not present

## 2021-03-19 DIAGNOSIS — Z992 Dependence on renal dialysis: Secondary | ICD-10-CM | POA: Diagnosis not present

## 2021-03-19 DIAGNOSIS — N2581 Secondary hyperparathyroidism of renal origin: Secondary | ICD-10-CM | POA: Diagnosis not present

## 2021-03-19 DIAGNOSIS — D631 Anemia in chronic kidney disease: Secondary | ICD-10-CM | POA: Diagnosis not present

## 2021-03-19 DIAGNOSIS — N186 End stage renal disease: Secondary | ICD-10-CM | POA: Diagnosis not present

## 2021-03-19 DIAGNOSIS — E876 Hypokalemia: Secondary | ICD-10-CM | POA: Diagnosis not present

## 2021-03-19 DIAGNOSIS — D689 Coagulation defect, unspecified: Secondary | ICD-10-CM | POA: Diagnosis not present

## 2021-03-21 DIAGNOSIS — N2581 Secondary hyperparathyroidism of renal origin: Secondary | ICD-10-CM | POA: Diagnosis not present

## 2021-03-21 DIAGNOSIS — N186 End stage renal disease: Secondary | ICD-10-CM | POA: Diagnosis not present

## 2021-03-21 DIAGNOSIS — D689 Coagulation defect, unspecified: Secondary | ICD-10-CM | POA: Diagnosis not present

## 2021-03-21 DIAGNOSIS — D631 Anemia in chronic kidney disease: Secondary | ICD-10-CM | POA: Diagnosis not present

## 2021-03-21 DIAGNOSIS — E876 Hypokalemia: Secondary | ICD-10-CM | POA: Diagnosis not present

## 2021-03-21 DIAGNOSIS — D509 Iron deficiency anemia, unspecified: Secondary | ICD-10-CM | POA: Diagnosis not present

## 2021-03-21 DIAGNOSIS — Z992 Dependence on renal dialysis: Secondary | ICD-10-CM | POA: Diagnosis not present

## 2021-03-23 DIAGNOSIS — E876 Hypokalemia: Secondary | ICD-10-CM | POA: Diagnosis not present

## 2021-03-23 DIAGNOSIS — Z992 Dependence on renal dialysis: Secondary | ICD-10-CM | POA: Diagnosis not present

## 2021-03-23 DIAGNOSIS — D689 Coagulation defect, unspecified: Secondary | ICD-10-CM | POA: Diagnosis not present

## 2021-03-23 DIAGNOSIS — D631 Anemia in chronic kidney disease: Secondary | ICD-10-CM | POA: Diagnosis not present

## 2021-03-23 DIAGNOSIS — N2581 Secondary hyperparathyroidism of renal origin: Secondary | ICD-10-CM | POA: Diagnosis not present

## 2021-03-23 DIAGNOSIS — N186 End stage renal disease: Secondary | ICD-10-CM | POA: Diagnosis not present

## 2021-03-23 DIAGNOSIS — D509 Iron deficiency anemia, unspecified: Secondary | ICD-10-CM | POA: Diagnosis not present

## 2021-03-26 DIAGNOSIS — N2581 Secondary hyperparathyroidism of renal origin: Secondary | ICD-10-CM | POA: Diagnosis not present

## 2021-03-26 DIAGNOSIS — N186 End stage renal disease: Secondary | ICD-10-CM | POA: Diagnosis not present

## 2021-03-26 DIAGNOSIS — D689 Coagulation defect, unspecified: Secondary | ICD-10-CM | POA: Diagnosis not present

## 2021-03-26 DIAGNOSIS — D631 Anemia in chronic kidney disease: Secondary | ICD-10-CM | POA: Diagnosis not present

## 2021-03-26 DIAGNOSIS — Z992 Dependence on renal dialysis: Secondary | ICD-10-CM | POA: Diagnosis not present

## 2021-03-26 DIAGNOSIS — E876 Hypokalemia: Secondary | ICD-10-CM | POA: Diagnosis not present

## 2021-03-26 DIAGNOSIS — D509 Iron deficiency anemia, unspecified: Secondary | ICD-10-CM | POA: Diagnosis not present

## 2021-03-28 DIAGNOSIS — N186 End stage renal disease: Secondary | ICD-10-CM | POA: Diagnosis not present

## 2021-03-28 DIAGNOSIS — E876 Hypokalemia: Secondary | ICD-10-CM | POA: Diagnosis not present

## 2021-03-28 DIAGNOSIS — D631 Anemia in chronic kidney disease: Secondary | ICD-10-CM | POA: Diagnosis not present

## 2021-03-28 DIAGNOSIS — D509 Iron deficiency anemia, unspecified: Secondary | ICD-10-CM | POA: Diagnosis not present

## 2021-03-28 DIAGNOSIS — Z992 Dependence on renal dialysis: Secondary | ICD-10-CM | POA: Diagnosis not present

## 2021-03-28 DIAGNOSIS — D689 Coagulation defect, unspecified: Secondary | ICD-10-CM | POA: Diagnosis not present

## 2021-03-28 DIAGNOSIS — N2581 Secondary hyperparathyroidism of renal origin: Secondary | ICD-10-CM | POA: Diagnosis not present

## 2021-03-28 LAB — MYOCARDIAL PERFUSION IMAGING
LV dias vol: 127 mL (ref 46–106)
LV sys vol: 54 mL
Nuc Stress EF: 57 %
Peak HR: 106 {beats}/min
Rest HR: 88 {beats}/min
ST Depression (mm): 0 mm
Stress Nuclear Isotope Dose: 29.2 mCi

## 2021-03-31 DIAGNOSIS — E876 Hypokalemia: Secondary | ICD-10-CM | POA: Diagnosis not present

## 2021-03-31 DIAGNOSIS — D631 Anemia in chronic kidney disease: Secondary | ICD-10-CM | POA: Diagnosis not present

## 2021-03-31 DIAGNOSIS — N186 End stage renal disease: Secondary | ICD-10-CM | POA: Diagnosis not present

## 2021-03-31 DIAGNOSIS — D689 Coagulation defect, unspecified: Secondary | ICD-10-CM | POA: Diagnosis not present

## 2021-03-31 DIAGNOSIS — N2581 Secondary hyperparathyroidism of renal origin: Secondary | ICD-10-CM | POA: Diagnosis not present

## 2021-03-31 DIAGNOSIS — D509 Iron deficiency anemia, unspecified: Secondary | ICD-10-CM | POA: Diagnosis not present

## 2021-03-31 DIAGNOSIS — Z992 Dependence on renal dialysis: Secondary | ICD-10-CM | POA: Diagnosis not present

## 2021-04-02 DIAGNOSIS — N186 End stage renal disease: Secondary | ICD-10-CM | POA: Diagnosis not present

## 2021-04-02 DIAGNOSIS — D509 Iron deficiency anemia, unspecified: Secondary | ICD-10-CM | POA: Diagnosis not present

## 2021-04-02 DIAGNOSIS — D689 Coagulation defect, unspecified: Secondary | ICD-10-CM | POA: Diagnosis not present

## 2021-04-02 DIAGNOSIS — D631 Anemia in chronic kidney disease: Secondary | ICD-10-CM | POA: Diagnosis not present

## 2021-04-02 DIAGNOSIS — E876 Hypokalemia: Secondary | ICD-10-CM | POA: Diagnosis not present

## 2021-04-02 DIAGNOSIS — Z992 Dependence on renal dialysis: Secondary | ICD-10-CM | POA: Diagnosis not present

## 2021-04-02 DIAGNOSIS — N2581 Secondary hyperparathyroidism of renal origin: Secondary | ICD-10-CM | POA: Diagnosis not present

## 2021-04-04 DIAGNOSIS — Z992 Dependence on renal dialysis: Secondary | ICD-10-CM | POA: Diagnosis not present

## 2021-04-04 DIAGNOSIS — I129 Hypertensive chronic kidney disease with stage 1 through stage 4 chronic kidney disease, or unspecified chronic kidney disease: Secondary | ICD-10-CM | POA: Diagnosis not present

## 2021-04-04 DIAGNOSIS — N186 End stage renal disease: Secondary | ICD-10-CM | POA: Diagnosis not present

## 2021-04-06 DIAGNOSIS — N186 End stage renal disease: Secondary | ICD-10-CM | POA: Diagnosis not present

## 2021-04-06 DIAGNOSIS — D689 Coagulation defect, unspecified: Secondary | ICD-10-CM | POA: Diagnosis not present

## 2021-04-06 DIAGNOSIS — E876 Hypokalemia: Secondary | ICD-10-CM | POA: Diagnosis not present

## 2021-04-06 DIAGNOSIS — D631 Anemia in chronic kidney disease: Secondary | ICD-10-CM | POA: Diagnosis not present

## 2021-04-06 DIAGNOSIS — Z992 Dependence on renal dialysis: Secondary | ICD-10-CM | POA: Diagnosis not present

## 2021-04-06 DIAGNOSIS — N2581 Secondary hyperparathyroidism of renal origin: Secondary | ICD-10-CM | POA: Diagnosis not present

## 2021-04-09 DIAGNOSIS — D689 Coagulation defect, unspecified: Secondary | ICD-10-CM | POA: Diagnosis not present

## 2021-04-09 DIAGNOSIS — D631 Anemia in chronic kidney disease: Secondary | ICD-10-CM | POA: Diagnosis not present

## 2021-04-09 DIAGNOSIS — N186 End stage renal disease: Secondary | ICD-10-CM | POA: Diagnosis not present

## 2021-04-09 DIAGNOSIS — E876 Hypokalemia: Secondary | ICD-10-CM | POA: Diagnosis not present

## 2021-04-09 DIAGNOSIS — N2581 Secondary hyperparathyroidism of renal origin: Secondary | ICD-10-CM | POA: Diagnosis not present

## 2021-04-09 DIAGNOSIS — Z992 Dependence on renal dialysis: Secondary | ICD-10-CM | POA: Diagnosis not present

## 2021-04-11 DIAGNOSIS — N186 End stage renal disease: Secondary | ICD-10-CM | POA: Diagnosis not present

## 2021-04-11 DIAGNOSIS — N2581 Secondary hyperparathyroidism of renal origin: Secondary | ICD-10-CM | POA: Diagnosis not present

## 2021-04-11 DIAGNOSIS — E876 Hypokalemia: Secondary | ICD-10-CM | POA: Diagnosis not present

## 2021-04-11 DIAGNOSIS — D631 Anemia in chronic kidney disease: Secondary | ICD-10-CM | POA: Diagnosis not present

## 2021-04-11 DIAGNOSIS — Z992 Dependence on renal dialysis: Secondary | ICD-10-CM | POA: Diagnosis not present

## 2021-04-11 DIAGNOSIS — D689 Coagulation defect, unspecified: Secondary | ICD-10-CM | POA: Diagnosis not present

## 2021-04-13 DIAGNOSIS — Z992 Dependence on renal dialysis: Secondary | ICD-10-CM | POA: Diagnosis not present

## 2021-04-13 DIAGNOSIS — D631 Anemia in chronic kidney disease: Secondary | ICD-10-CM | POA: Diagnosis not present

## 2021-04-13 DIAGNOSIS — E876 Hypokalemia: Secondary | ICD-10-CM | POA: Diagnosis not present

## 2021-04-13 DIAGNOSIS — D689 Coagulation defect, unspecified: Secondary | ICD-10-CM | POA: Diagnosis not present

## 2021-04-13 DIAGNOSIS — N2581 Secondary hyperparathyroidism of renal origin: Secondary | ICD-10-CM | POA: Diagnosis not present

## 2021-04-13 DIAGNOSIS — N186 End stage renal disease: Secondary | ICD-10-CM | POA: Diagnosis not present

## 2021-04-16 DIAGNOSIS — N186 End stage renal disease: Secondary | ICD-10-CM | POA: Diagnosis not present

## 2021-04-16 DIAGNOSIS — D689 Coagulation defect, unspecified: Secondary | ICD-10-CM | POA: Diagnosis not present

## 2021-04-16 DIAGNOSIS — Z992 Dependence on renal dialysis: Secondary | ICD-10-CM | POA: Diagnosis not present

## 2021-04-16 DIAGNOSIS — N2581 Secondary hyperparathyroidism of renal origin: Secondary | ICD-10-CM | POA: Diagnosis not present

## 2021-04-16 DIAGNOSIS — E876 Hypokalemia: Secondary | ICD-10-CM | POA: Diagnosis not present

## 2021-04-16 DIAGNOSIS — D631 Anemia in chronic kidney disease: Secondary | ICD-10-CM | POA: Diagnosis not present

## 2021-04-18 DIAGNOSIS — N186 End stage renal disease: Secondary | ICD-10-CM | POA: Diagnosis not present

## 2021-04-18 DIAGNOSIS — D689 Coagulation defect, unspecified: Secondary | ICD-10-CM | POA: Diagnosis not present

## 2021-04-18 DIAGNOSIS — D631 Anemia in chronic kidney disease: Secondary | ICD-10-CM | POA: Diagnosis not present

## 2021-04-18 DIAGNOSIS — E876 Hypokalemia: Secondary | ICD-10-CM | POA: Diagnosis not present

## 2021-04-18 DIAGNOSIS — N2581 Secondary hyperparathyroidism of renal origin: Secondary | ICD-10-CM | POA: Diagnosis not present

## 2021-04-18 DIAGNOSIS — Z992 Dependence on renal dialysis: Secondary | ICD-10-CM | POA: Diagnosis not present

## 2021-04-20 DIAGNOSIS — Z992 Dependence on renal dialysis: Secondary | ICD-10-CM | POA: Diagnosis not present

## 2021-04-20 DIAGNOSIS — D631 Anemia in chronic kidney disease: Secondary | ICD-10-CM | POA: Diagnosis not present

## 2021-04-20 DIAGNOSIS — E876 Hypokalemia: Secondary | ICD-10-CM | POA: Diagnosis not present

## 2021-04-20 DIAGNOSIS — N2581 Secondary hyperparathyroidism of renal origin: Secondary | ICD-10-CM | POA: Diagnosis not present

## 2021-04-20 DIAGNOSIS — N186 End stage renal disease: Secondary | ICD-10-CM | POA: Diagnosis not present

## 2021-04-20 DIAGNOSIS — D689 Coagulation defect, unspecified: Secondary | ICD-10-CM | POA: Diagnosis not present

## 2021-04-23 DIAGNOSIS — Z992 Dependence on renal dialysis: Secondary | ICD-10-CM | POA: Diagnosis not present

## 2021-04-23 DIAGNOSIS — N2581 Secondary hyperparathyroidism of renal origin: Secondary | ICD-10-CM | POA: Diagnosis not present

## 2021-04-23 DIAGNOSIS — E876 Hypokalemia: Secondary | ICD-10-CM | POA: Diagnosis not present

## 2021-04-23 DIAGNOSIS — N186 End stage renal disease: Secondary | ICD-10-CM | POA: Diagnosis not present

## 2021-04-23 DIAGNOSIS — D631 Anemia in chronic kidney disease: Secondary | ICD-10-CM | POA: Diagnosis not present

## 2021-04-23 DIAGNOSIS — D689 Coagulation defect, unspecified: Secondary | ICD-10-CM | POA: Diagnosis not present

## 2021-04-25 DIAGNOSIS — Z992 Dependence on renal dialysis: Secondary | ICD-10-CM | POA: Diagnosis not present

## 2021-04-25 DIAGNOSIS — D631 Anemia in chronic kidney disease: Secondary | ICD-10-CM | POA: Diagnosis not present

## 2021-04-25 DIAGNOSIS — N186 End stage renal disease: Secondary | ICD-10-CM | POA: Diagnosis not present

## 2021-04-25 DIAGNOSIS — E876 Hypokalemia: Secondary | ICD-10-CM | POA: Diagnosis not present

## 2021-04-25 DIAGNOSIS — D689 Coagulation defect, unspecified: Secondary | ICD-10-CM | POA: Diagnosis not present

## 2021-04-25 DIAGNOSIS — N2581 Secondary hyperparathyroidism of renal origin: Secondary | ICD-10-CM | POA: Diagnosis not present

## 2021-04-27 DIAGNOSIS — D631 Anemia in chronic kidney disease: Secondary | ICD-10-CM | POA: Diagnosis not present

## 2021-04-27 DIAGNOSIS — E876 Hypokalemia: Secondary | ICD-10-CM | POA: Diagnosis not present

## 2021-04-27 DIAGNOSIS — N186 End stage renal disease: Secondary | ICD-10-CM | POA: Diagnosis not present

## 2021-04-27 DIAGNOSIS — N2581 Secondary hyperparathyroidism of renal origin: Secondary | ICD-10-CM | POA: Diagnosis not present

## 2021-04-27 DIAGNOSIS — D689 Coagulation defect, unspecified: Secondary | ICD-10-CM | POA: Diagnosis not present

## 2021-04-27 DIAGNOSIS — Z992 Dependence on renal dialysis: Secondary | ICD-10-CM | POA: Diagnosis not present

## 2021-04-30 DIAGNOSIS — D631 Anemia in chronic kidney disease: Secondary | ICD-10-CM | POA: Diagnosis not present

## 2021-04-30 DIAGNOSIS — D689 Coagulation defect, unspecified: Secondary | ICD-10-CM | POA: Diagnosis not present

## 2021-04-30 DIAGNOSIS — N186 End stage renal disease: Secondary | ICD-10-CM | POA: Diagnosis not present

## 2021-04-30 DIAGNOSIS — Z992 Dependence on renal dialysis: Secondary | ICD-10-CM | POA: Diagnosis not present

## 2021-04-30 DIAGNOSIS — E876 Hypokalemia: Secondary | ICD-10-CM | POA: Diagnosis not present

## 2021-04-30 DIAGNOSIS — N2581 Secondary hyperparathyroidism of renal origin: Secondary | ICD-10-CM | POA: Diagnosis not present

## 2021-05-02 DIAGNOSIS — Z992 Dependence on renal dialysis: Secondary | ICD-10-CM | POA: Diagnosis not present

## 2021-05-02 DIAGNOSIS — E876 Hypokalemia: Secondary | ICD-10-CM | POA: Diagnosis not present

## 2021-05-02 DIAGNOSIS — N2581 Secondary hyperparathyroidism of renal origin: Secondary | ICD-10-CM | POA: Diagnosis not present

## 2021-05-02 DIAGNOSIS — N186 End stage renal disease: Secondary | ICD-10-CM | POA: Diagnosis not present

## 2021-05-02 DIAGNOSIS — D631 Anemia in chronic kidney disease: Secondary | ICD-10-CM | POA: Diagnosis not present

## 2021-05-02 DIAGNOSIS — D689 Coagulation defect, unspecified: Secondary | ICD-10-CM | POA: Diagnosis not present

## 2021-05-05 DIAGNOSIS — Z992 Dependence on renal dialysis: Secondary | ICD-10-CM | POA: Diagnosis not present

## 2021-05-05 DIAGNOSIS — N186 End stage renal disease: Secondary | ICD-10-CM | POA: Diagnosis not present

## 2021-05-05 DIAGNOSIS — I129 Hypertensive chronic kidney disease with stage 1 through stage 4 chronic kidney disease, or unspecified chronic kidney disease: Secondary | ICD-10-CM | POA: Diagnosis not present

## 2021-05-31 ENCOUNTER — Ambulatory Visit (INDEPENDENT_AMBULATORY_CARE_PROVIDER_SITE_OTHER): Payer: 59

## 2021-05-31 ENCOUNTER — Other Ambulatory Visit: Payer: Self-pay

## 2021-05-31 ENCOUNTER — Telehealth: Payer: Self-pay

## 2021-05-31 VITALS — BP 160/78 | HR 95 | Temp 99.0°F | Ht 69.0 in | Wt 220.0 lb

## 2021-05-31 DIAGNOSIS — Z0000 Encounter for general adult medical examination without abnormal findings: Secondary | ICD-10-CM | POA: Diagnosis not present

## 2021-05-31 NOTE — Progress Notes (Addendum)
Subjective:   [name] is a 61 y.o. female who presents for Medicare Annual (Subsequent) preventive examination.  Review of Systems: Defer to PCP.  Cardiac Risk Factors include: advanced age (>75mn, >>24women);diabetes mellitus;obesity (BMI >30kg/m2);hypertension  Objective:   Vitals: BP (!) 160/78    Pulse 95    Temp 99 F (37.2 C)    Ht _0  (1.753 m)    Wt 220 lb (99.8 kg)    LMP  (LMP Unknown)    BMI 32.49 kg/m   Body mass index is 32.49 kg/m.  Advanced Directives 05/31/2021 01/30/2021 12/18/2020 10/19/2020 10/19/2019 09/23/2019 04/22/2019  Does Patient Have a Medical Advance Directive? Yes _1  -  Type of [place] will - - - - - -  Does patient want to make changes to medical advance directive? No - Patient declined - - - - - -  [place] of [place]? Yes - validated most recent copy scanned in chart (See row information) - - - - - -  Would patient like information on creating a medical advance directive? - No - Patient declined No - Patient declined No - Patient declined No - Patient declined No - Patient declined No - Patient declined  Pre-existing out of facility DNR order (yellow form or pink MOST form) - - - - - - -   Tobacco Social History   Tobacco Use  Smoking Status Former   Years: 20.00   Types: Cigarettes   Quit date: 10/16/2011   Years since quitting: 9.6  Smokeless Tobacco Never  Tobacco Comments   1 pack will last a week     Counseling given: No plans to restart  Clinical Intake:  Pre-visit preparation completed: Yes  Pain Score: 10-Worst pain ever  How often do you need to have someone help you when you read instructions, pamphlets, or other written materials from your doctor or pharmacy?: 2 - Rarely What is the last grade level you completed in school?: High School  Interpreter Needed?: No  Past Medical History:  Diagnosis Date   Accelerated hypertension    Aortic  atherosclerosis ([place]    Arthritis of knee    bilateral   Diabetes mellitus    type 2 - no meds   Diabetic [place]roparesis [place]    Diverticulosis    ESRD ([place] stage renal disease) [place]    Fibroid uterus    GERD (gastroesophageal reflux disease)    Hyperlipidemia    Hypertension    HYPERTENSION, BENIGN SYSTEMIC 07/03/2006         Hypertensive emergency 02/05/2019   Hypokalemia    IDA (iron deficiency anemia)    Nausea    Renal disorder    Umbilical hernia    Wears dentures    Past Surgical History:  Procedure Laterality Date   AV FISTULA PLACEMENT Left 02/17/2019   Procedure: ARTERIOVENOUS (AV) FISTULA CREATION LEFT UPPER ARM;  Surgeon: CWaynetta Sandy MD;  [facility]  Service: [facility];  Laterality: Left;   CATARACT EXTRACTION     right eye   CESAREAN SECTION     x2   CHOLECYSTECTOMY     laparoscopic   FISTULA SUPERFICIALIZATION Left 04/06/2019   Procedure: FISTULA SUPERFICIALIZATION LEFT BRACHIOCEPHALIC;  Surgeon: CWaynetta Sandy MD;  [place] [facility]  Service: Vascular;  Laterality: Left;  Transposition left arm brachiocephalic fistula.   IR FLUORO GUIDE CV LINE RIGHT  02/09/2019  IR FLUORO GUIDE CV LINE RIGHT  03/05/2019   IR US GUIDE VASC ACCESS RIGHT  02/09/2019   MULTIPLE TOOTH EXTRACTIONS     REDUCTION MAMMAPLASTY Bilateral    Family History  Problem Relation Age of Onset   Hypertension Mother    Diabetes Mother    Cancer Father        lung   Colon cancer Neg Hx    Stomach cancer Neg Hx    Esophageal cancer Neg Hx    Social History   Socioeconomic History   Marital status: Legally Separated    Spouse name: Not on file   Number of children: 2   Years of education: 12   Highest education level: 12th grade  Occupational History   Occupation: housekeeping   Occupation: disabled  Tobacco Use   Smoking status: Former    Years: 20.00    Types: Cigarettes    Quit date: 10/16/2011    Years since quitting: 9.6   Smokeless  tobacco: Never   Tobacco comments:    1 pack will last a week  Vaping Use   Vaping Use: Never used  Substance and Sexual Activity   Alcohol use: No    Alcohol/week: 0.0 standard drinks   Drug use: No   Sexual activity: Not Currently  Other Topics Concern   Not on file  Social History Narrative   Patient and her son live together in [place].    Patient has (1) son and (1) daughter- they are very close.    Patient walks 30 minutes a day for exercise.    Patient is very active in her church community.    Social Determinants of Health   Financial Resource Strain: Low Risk    Difficulty of Paying Living Expenses: Not hard at all  Food Insecurity: No Food Insecurity   Worried About Charity fundraiser in the Last Year: Never true   [place] in the Last Year: Never true  Transportation Needs: No Transportation Needs   Lack of Transportation (Medical): No   Lack of Transportation (Non-Medical): No  Physical Activity: Sufficiently Active   Days of Exercise per Week: 7 days   Minutes of Exercise per Session: 30 min  Stress: No Stress Concern Present   Feeling of Stress : Only a little  Social Connections: Moderately Integrated   Frequency of Communication with Friends and Family: More than three times a week   Frequency of Social Gatherings with Friends and Family: More than three times a week   Attends Religious Services: More than 4 times per year   Active Member of [employer] or Organizations: Yes   Attends Archivist Meetings: More than 4 times per year   Marital Status: Separated   Outpatient Encounter Medications as of 05/31/2021  Medication Sig   Accu-Chek Softclix Lancets lancets Use as instructed (Patient taking differently: 1 each by Other route See admin instructions. Use as instructed)   aspirin 325 MG tablet Take 325 mg by mouth daily.   Blood Glucose Monitoring Suppl (ACCU-CHEK AVIVA PLUS) w/Device KIT Use to check sugar three times a day (Patient taking  differently: 1 each by Other route in the morning, at noon, and at bedtime.)   Darbepoetin Alfa (ARANESP) 200 MCG/0.4ML SOSY injection Inject 0.4 mLs (200 mcg total) into the vein every Wednesday with hemodialysis.   glucose blood (ACCU-CHEK AVIVA PLUS) test strip Use as instructed (Patient taking differently: 1 each by Other route See admin instructions. Use  as instructed)   lactulose (CHRONULAC) 10 GM/15ML solution Take 20 g by mouth 3 (three) times daily as needed for mild constipation.   Lancets (ACCU-CHEK SOFT TOUCH) lancets Use to check sugars three times a day (Patient taking differently: 1 each by Other route in the morning, at noon, and at bedtime.)   ondansetron (ZOFRAN ODT) 4 MG disintegrating tablet Take 1-2 tablets (4-8 mg total) by mouth every 8 (eight) hours as needed for nausea or vomiting.   metoCLOPramide (REGLAN) 10 MG tablet Take 1 tablet (10 mg total) by mouth 4 (four) times daily -  before meals and at bedtime. (Patient not taking: Reported on 05/31/2021)   rosuvastatin (CRESTOR) 5 MG tablet Take by mouth. (Patient not taking: Reported on 05/31/2021)   No facility-administered encounter medications on file as of 05/31/2021.   Activities of Daily Living In your present state of health, do you have any difficulty performing the following activities: 05/31/2021  Hearing? N  Vision? N  Difficulty concentrating or making decisions? N  Walking or climbing stairs? N  Dressing or bathing? N  Doing errands, shopping? N  Preparing Food and eating ? N  Using the Toilet? N  In the past six months, have you accidently leaked urine? N  Do you have problems with loss of bowel control? N  Managing your Medications? N  Managing your Finances? N  Housekeeping or managing your Housekeeping? N  Some recent data might be hidden   Patient Care Team: Lurline Del, DO as PCP - General ([facility]    Assessment:   This is a routine wellness examination  for Takeysha.  Exercise Activities and Dietary recommendations Current Exercise Habits: Home exercise routine, Time (Minutes): 30, Frequency (Times/Week): 7, Weekly Exercise (Minutes/Week): 210, Intensity: Moderate, Exercise limited by: cardiac condition(s)   Goals      HEMOGLOBIN A1C < 7     A1c-5.4 01/2021       Fall Risk Fall Risk  05/31/2021 11/04/2019 02/23/2019 11/12/2018 05/21/2017  Falls in the past year? 1 0 0 0 No  Number falls in past yr: 0 - 0 0 -  Injury with Fall? 1 - - - -  Risk for fall due to : No Fall Risks - - - -  Follow up Falls prevention discussed - - - -   Patient has appropriate gait and balance. Patient does not use any assistive devices to ambulate.   Is the patient's home free of loose throw rugs in walkways, pet beds, electrical cords, etc?   yes      Grab bars in the bathroom? yes      Handrails on the stairs?   yes      Adequate lighting?   yes  Patient rating of health (0-10) scale: 10  Depression Screen PHQ 2/9 Scores 05/31/2021 05/31/2021 01/30/2021 10/19/2020  PHQ - 2 Score 0 0 2 0  PHQ- 9 Score 2 - 5 3    Cognitive Functio 6CIT Screen 05/31/2021  What time? 0 points  Count back from 20 0 points  Months in reverse 2 points  Repeat phrase 2 points   Immunization History  Administered Date(s) Administered   PFIZER Comirnaty(Gray Top)Covid-19 Tri-Sucrose Vaccine 12/09/2019, 01/08/2020   PPD Test 05/15/2011   Tdap 05/17/2011   Qualifies for Shingles Vaccine? Yes   Shingrix Completed: No, Education has been provided regarding the importance of this vaccine. Advised may receive this vaccine at local pharmacy or Health Dept. Aware to provide  a copy of the vaccination record if obtained from local pharmacy or Health Dept. Verbalized acceptance and understanding.  Screening Tests Health Maintenance  Topic Date Due   Zoster Vaccines- Shingrix (1 of 2) Never done   FOOT EXAM  08/20/2016   URINE MICROALBUMIN  11/12/2019   OPHTHALMOLOGY EXAM  12/10/2019    COVID-19 Vaccine (3 - Booster for Pfizer series) 03/04/2020   COLONOSCOPY (Pts 45-27yr Insurance coverage will need to be confirmed)  05/13/2021   TETANUS/TDAP  05/16/2021   INFLUENZA VACCINE  08/03/2021 (Originally 12/04/2020)   HEMOGLOBIN A1C  07/30/2021   MAMMOGRAM  10/20/2022   PAP SMEAR-Modifier  10/19/2025   Hepatitis C Screening  Completed   HIV Screening  Completed   HPV VACCINES  Aged Out   *Patient reports seeing Dr. GKaty Fitchin December 2022. Will need [facility] records.   Cancer Screenings: Lung: Low Dose CT Chest recommended if Age 63-80years, 20 pack-year currently smoking OR have quit w/in 15years. Patient does not qualify. Breast:  Up to date on Mammogram? Yes   Up to date of Bone Density/Dexa? NA Colorectal: No- discussed options with patient to discuss with PCP. Cervical: Pap Smear- 10/19/2020  Additional Screenings:  Hepatitis C Screening: Completed  HIV Screening: Completed   Plan:  Apt tomorrow scheduled for leg pain.  PCP apt scheduled for 3/16 for diabetes. Keep up the good work walking! ROI signed for recent eye exam.  I have personally reviewed and noted the following in the patients chart:   Medical and social history Use of alcohol, tobacco or illicit drugs  Current medications and supplements Functional ability and status Nutritional status Physical activity Advanced directives List of other physicians Hospitalizations, surgeries, and ER visits in previous 12 months Vitals Screenings to include cognitive, depression, and falls Referrals and appointments  In addition, I have reviewed and discussed with patient certain preventive protocols, quality metrics, and best practice recommendations. A written personalized care plan for preventive services as well as general preventive health recommendations were provided to patient.  EDorna Bloom CMA  05/31/2021   I have reviewed this visit and agree with the documentation.

## 2021-05-31 NOTE — Patient Instructions (Addendum)
You spoke to Dorna Bloom, [place] for your annual wellness visit.  We discussed goals:   Goals      HEMOGLOBIN A1C < 7     A1c-5.4 01/2021       We also discussed recommended health maintenance. As discussed, you are due for: Health Maintenance  Topic Date Due   Zoster Vaccines- Shingrix (1 of 2) Never done   FOOT EXAM  08/20/2016   URINE MICROALBUMIN  11/12/2019   OPHTHALMOLOGY EXAM  12/10/2019   COVID-19 Vaccine (3 - Booster for Pfizer series) 03/04/2020   COLONOSCOPY (Pts 45-1yrs Insurance coverage will need to be confirmed)  05/13/2021   TETANUS/TDAP  05/16/2021   INFLUENZA VACCINE  08/03/2021 (Originally 12/04/2020)   HEMOGLOBIN A1C  07/30/2021   MAMMOGRAM  10/20/2022   PAP SMEAR-Modifier  10/19/2025   Hepatitis C Screening  Completed   HIV Screening  Completed   HPV VACCINES  Aged Out   Apt tomorrow scheduled for leg pain.  PCP apt scheduled for 3/16 for diabetes. Keep up the good work walking! ROI signed for recent eye exam.  We also discussed Colonoscopy options. Discuss with PCP in March if Cologuard would be a good option.   Preventive Care 63-68 Years Old, Female Preventive care refers to lifestyle choices and visits with your health care provider that can promote health and wellness. Preventive care visits are also called wellness exams. What can I expect for my preventive care visit? Counseling Your health care provider may ask you questions about your: Medical history, including: Past medical problems. Family medical history. Pregnancy history. Current health, including: Menstrual cycle. Method of birth control. Emotional well-being. Home life and relationship well-being. Sexual activity and sexual health. Lifestyle, including: Alcohol, nicotine or tobacco, and drug use. Access to firearms. Diet, exercise, and sleep habits. Work and work Statistician. Sunscreen use. Safety issues such as seatbelt and bike helmet use. Physical exam Your health care  provider will check your: Height and weight. These may be used to calculate your BMI (body mass index). BMI is a measurement that tells if you are at a healthy weight. Waist circumference. This measures the distance around your waistline. This measurement also tells if you are at a healthy weight and may help predict your risk of certain diseases, such as type 2 diabetes and high blood pressure. Heart rate and blood pressure. Body temperature. Skin for abnormal spots. What immunizations do I need? Vaccines are usually given at various ages, according to a schedule. Your health care provider will recommend vaccines for you based on your age, medical history, and lifestyle or other factors, such as travel or where you work. What tests do I need? Screening Your health care provider may recommend screening tests for certain conditions. This may include: Lipid and cholesterol levels. Diabetes screening. This is done by checking your blood sugar (glucose) after you have not eaten for a while (fasting). Pelvic exam and Pap test. Hepatitis B test. Hepatitis C test. HIV (human immunodeficiency virus) test. STI (sexually transmitted infection) testing, if you are at risk. Lung cancer screening. Colorectal cancer screening. Mammogram. Talk with your health care provider about when you should start having regular mammograms. This may depend on whether you have a family history of breast cancer. BRCA-related cancer screening. This may be done if you have a family history of breast, ovarian, tubal, or peritoneal cancers. Bone density scan. This is done to screen for osteoporosis. Talk with your health care provider about your test results, treatment  options, and if necessary, the need for more tests. Follow these instructions at home: Eating and drinking  Eat a diet that includes fresh fruits and vegetables, whole grains, lean protein, and low-fat dairy products. Take vitamin and mineral supplements as  recommended by your health care provider. Do not drink alcohol if: Your health care provider tells you not to drink. You are pregnant, may be pregnant, or are planning to become pregnant. If you drink alcohol: Limit how much you have to 0-1 drink a day. Know how much alcohol is in your drink. In the [place] one drink equals one 12 oz bottle of beer (355 mL), one 5 oz glass of wine (148 mL), or one 1 oz glass of hard liquor (44 mL). Lifestyle Brush your teeth every morning and night with fluoride toothpaste. Floss one time each day. Exercise for at least 30 minutes 5 or more days each week. Do not use any products that contain nicotine or tobacco. These products include cigarettes, chewing tobacco, and vaping devices, such as e-cigarettes. If you need help quitting, ask your health care provider. Do not use drugs. If you are sexually active, practice safe sex. Use a condom or other form of protection to prevent STIs. If you do not wish to become pregnant, use a form of birth control. If you plan to become pregnant, see your health care provider for a prepregnancy visit. Take aspirin only as told by your health care provider. Make sure that you understand how much to take and what form to take. Work with your health care provider to find out whether it is safe and beneficial for you to take aspirin daily. Find healthy ways to manage stress, such as: Meditation, yoga, or listening to music. Journaling. Talking to a trusted person. Spending time with friends and family. Minimize exposure to UV radiation to reduce your risk of skin cancer. Safety Always wear your seat belt while driving or riding in a vehicle. Do not drive: If you have been drinking alcohol. Do not ride with someone who has been drinking. When you are tired or distracted. While texting. If you have been using any mind-altering substances or drugs. Wear a helmet and other protective equipment during sports activities. If you  have firearms in your house, make sure you follow all gun safety procedures. Seek help if you have been physically or sexually abused. What's next? Visit your health care provider once a year for an annual wellness visit. Ask your health care provider how often you should have your eyes and teeth checked. Stay up to date on all vaccines. This information is not intended to replace advice given to you by your health care provider. Make sure you discuss any questions you have with your health care provider. Document Revised: 10/18/2020 Document Reviewed: 10/18/2020 Elsevier Patient Education  [place].   Diet Recommendations for Diabetes   1. Eat at least 3 meals and 1-2 snacks per day. Never go more than 4-5 hours while awake without eating. Eat breakfast within the first hour of getting up.   2. Limit starchy foods to TWO per meal and ONE per snack. ONE portion of a starchy  food is equal to the following:   - ONE slice of bread (or its equivalent, such as half of a hamburger bun).   - 1/2 cup of a "scoopable" starchy food such as potatoes or rice.   - 15 grams of Total Carbohydrate as shown on food label.  3. Include at  every meal: a protein food, a carb food, and vegetables and/or fruit.   - Obtain twice the volume of vegetables as protein or carbohydrate foods for both lunch and dinner.   - Fresh or frozen vegetables are best.   - Keep frozen vegetables on hand for a quick vegetable serving.       Starchy (carb) foods: Bread, rice, pasta, potatoes, corn, cereal, grits, crackers, bagels, muffins, all baked goods.  (Fruits, milk, and yogurt also have carbohydrate, but most of these foods will not spike your blood sugar as most starchy foods will.)  A few fruits do cause high blood sugars; use small portions of bananas (limit to 1/2 at a time), grapes, watermelon, oranges, and most tropical fruits.    Protein foods: Meat, fish, poultry, eggs, dairy foods, and beans such as pinto  and kidney beans (beans also provide carbohydrate).        Our clinic's number is [phone]. Please call with questions or concerns about what we discussed today.

## 2021-05-31 NOTE — Telephone Encounter (Signed)
Patient presented for AWV complaining of right leg pain. Patient reports 10/10 pain at this time. Patient reports the pain has been off and on for ~ 1 week. Patient denies any redness, swelling or bruising. Patient reports she has been taking Tylenol with no relief.   Patient reports she did have a fall last year and fell in a hole, however does not know if related.   Patient scheduled for tomorrow for evaluation.   Red flags discussed in the meantime.

## 2021-06-01 ENCOUNTER — Ambulatory Visit: Payer: 59

## 2021-06-01 NOTE — Progress Notes (Deleted)
° ° °  SUBJECTIVE:   CHIEF COMPLAINT / HPI:  No chief complaint on file.   Noted to have severe right leg pain intermittent for about 1 week.  PERTINENT  PMH / PSH: CVA, HTN, venous insufficiency, T2DM, ESRD on HD, former smoker  Patient Care Team: Carolyn Del, DO as PCP - General (Family Medicine) Center, [facility]   OBJECTIVE:   LMP  (LMP Unknown)   Physical Exam   Depression screen [facility] 2/9 05/31/2021  Decreased Interest 0  Down, Depressed, Hopeless 0  PHQ - 2 Score 0  Altered sleeping 0  Tired, decreased energy 1  Change in appetite 1  Feeling bad or failure about yourself  0  Trouble concentrating 0  Moving slowly or fidgety/restless 0  Suicidal thoughts 0  PHQ-9 Score 2  Difficult doing work/chores -  Some recent data might be hidden     {Show previous vital signs (optional):23777}  {Labs   Heme   Chem   Endocrine   Serology   Results Review (optional):23779}  ASSESSMENT/PLAN:   No problem-specific Assessment & Plan notes found for this encounter.    No follow-ups on file.   Zola Button, MD [facility]

## 2021-07-19 ENCOUNTER — Ambulatory Visit: Payer: 59 | Admitting: Family Medicine

## 2021-07-19 NOTE — Progress Notes (Deleted)
? ? ?  SUBJECTIVE:  ? ?CHIEF COMPLAINT / HPI:  ? ?Diabetic Follow Up: ?Patient is a 61 y.o. female who present today for diabetic follow up.  ? ?Patient endorses {rwdmsmartlistproblems:24882} ? ?Home medications include: None, diet controlled ? ?Most recent A1Cs:  ?Lab Results  ?Component Value Date  ? HGBA1C 5.4 01/30/2021  ? HGBA1C 6.7 (H) 09/19/2019  ? HGBA1C 5.1 03/10/2019  ? ?Last Microalbumin, LDL, Creatinine: ?Lab Results  ?Component Value Date  ? MICROALBUR 150 11/12/2018  ? [place] 147 (H) 01/30/2021  ? CREATININE 4.86 (H) 12/18/2020  ? ?Patient {rwdoesdoesnot:24881} check blood glucose on a regular basis. ? ?Patient {rwisisnot:24883} up to date on diabetic eye. ?Patient is not up to date on diabetic foot exam. ? 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  Per documentation is recommended to have yearly CT abdomen. ? ? ?PERTINENT  PMH / PSH: *** ? ?OBJECTIVE:  ? ?LMP  (LMP Unknown)  *** ? ?{Simple Foot Exam:25261::"Diabetic foot exam was performed. ","No deformities or other abnormal visual findings. ","Posterior tibialis and dorsalis pulse intact bilaterally. ","Intact to touch and monofilament testing bilaterally. "} ? ? ?General: NAD, pleasant, able to participate in exam ?Cardiac: RRR, no murmurs. ?Respiratory: CTAB, normal effort, No wheezes, rales or rhonchi ?Abdomen: Bowel sounds present, nontender, nondistended, no hepatosplenomegaly. ?Extremities: no edema or cyanosis. ?Skin: warm and dry, no rashes noted ?Neuro: alert, no obvious focal deficits ?Psych: Normal affect and mood ? ?ASSESSMENT/PLAN:  ? ?No problem-specific Assessment & Plan notes found for this encounter. ?  ?Diet-controlled type 2 diabetes: ?Takes no medications.  Previous A1c of 5.4, A1c today of***.  We will check urine microalbumin***.  Diabetic foot exam performed***.  Follow-up in 6 months ? ?Left renal artery aneurysm: ?Per documentation in previous discussions with vascular noted  in her chart she should have yearly CT abdomen to evaluate.  It appears this has been ordered by nephrology and I recommended that she complete this test. ? ?Health maintenance items include colonoscopy*** ? ?Lurline Del, DO ?[place]  ? ? ?{    This will disappear when note is signed, click to select method of visit    :1} ?

## 2021-07-23 NOTE — Progress Notes (Signed)
? ? ?SUBJECTIVE:  ? ?CHIEF COMPLAINT / HPI:  ? ?Diabetic Follow Up: ?Patient is a 61 y.o. female who present today for diabetic follow up.  ? ?Home medications include: None, diet controlled ? ?Most recent A1Cs:  ?Lab Results  ?Component Value Date  ? HGBA1C 5.4 07/24/2021  ? HGBA1C 5.4 01/30/2021  ? HGBA1C 6.7 (H) 09/19/2019  ? ?Last Microalbumin, LDL, Creatinine: ?Lab Results  ?Component Value Date  ? MICROALBUR 150 11/12/2018  ? [place] 147 (H) 01/30/2021  ? CREATININE 4.86 (H) 12/18/2020  ? ?Patient is up to date on diabetic eye. ?Patient is not up to date on diabetic foot exam. ? ?Dysuria: ?Started 1 week ago. No frequency. No suprapubic discomfort but has some back pain. No fevers.  ? ?Right-sided lower back pain: ?Denies trauma but states this started about a week ago.  Is located in her right lower back.  She denies any fevers.  Denies saddle paresthesias, urinary incontinence, bowel incontinence. ? ?Hypertension: ?BP was "200 over something" at HD yesterday. States she takes amlodipine.  She thinks her pain is causing her blood pressure to be elevated. ? ?PERTINENT  PMH / PSH: ESRD on HD ? ?OBJECTIVE:  ? ?BP (!) 165/82   Pulse 94   Ht '5\' 9"'$  (1.753 m)   Wt 216 lb 3.2 oz (98.1 kg)   LMP  (LMP Unknown)   SpO2 100%   BMI 31.93 kg/m?   ? ?General: NAD, pleasant, able to participate in exam ?Cardiac: RRR, no murmurs. ?Respiratory: CTAB, normal effort ?Abdomen: No discomfort to palpation, no suprapubic discomfort, negative CVA tenderness bilaterally ?MSK: She does have an area of discomfort in the musculature on the right side lateral to the lumbar spine in the region of L4-L5 that has some discomfort on palpation.  She has no midline discomfort to palpation of the spine.  She is got a negative straight leg raise test bilaterally. ?Psych: Normal affect and mood ? ?ASSESSMENT/PLAN:  ? ?No problem-specific Assessment & Plan notes found for this encounter. ?  ?Diet-controlled type 2 diabetes: ?Takes no  medications.  Previous A1c of 5.4, A1c today of 5.4.  Urine microalbumin ordered.  Follow-up in 6 months ? ?Right-sided low back pain: ?Has been going on for about a week.  No red flag symptoms.  On physical exam it seems to be located in the musculature.  Does not appear to be in the region of the kidneys and she has negative CVA tenderness.  She is got negative straight leg raise test.  Discussed case with Dr. Nori Riis and will initiate tramadol which appears to be safe given her HD status for 7 days and follow-up in 7 days to see if her symptoms improved.  Anticipate that this is due to musculoskeletal strain and expect improvement in 7 days.  If not we will further investigate.  I did discuss return precautions with her. ? ?Dysuria: ?She has dysuria for about a week with no urinary frequency, no suprapubic discomfort, no fevers.  Recommended a urinalysis today but the patient was unable to stay in order to give urine.  She states she will follow-up with this.  I have less concern for a UTI as she has no suprapubic discomfort, no urinary frequency, additionally she has no fevers.  She does endorse some back pain but this seems to be musculoskeletal and is not in the region of the kidneys and has negative CVA tenderness.  Recommended following up to drop off the urine sample as she is  able.  I have ordered this is a future order.  Discussed return precautions. ? ?Hypertension: ?Blood pressure elevated 165/82.  She states that is normally well controlled but recently at dialysis was "200 over something".  She thinks the blood pressure is due to her discomfort of her back and that is possible.  I recommended continuing to monitor her pressures after treating her low back pain (see above), and if they remain elevated at dialysis following up with her nephrologist.  I do not want to risk starting a medicine that may drop her blood pressure too low during HD. ? ?Lurline Del, DO ?[place]  ? ? ? ?

## 2021-07-24 ENCOUNTER — Ambulatory Visit (INDEPENDENT_AMBULATORY_CARE_PROVIDER_SITE_OTHER): Payer: 59 | Admitting: Family Medicine

## 2021-07-24 ENCOUNTER — Other Ambulatory Visit: Payer: Self-pay

## 2021-07-24 VITALS — BP 165/82 | HR 94 | Ht 69.0 in | Wt 216.2 lb

## 2021-07-24 DIAGNOSIS — E1159 Type 2 diabetes mellitus with other circulatory complications: Secondary | ICD-10-CM | POA: Diagnosis not present

## 2021-07-24 DIAGNOSIS — E119 Type 2 diabetes mellitus without complications: Secondary | ICD-10-CM

## 2021-07-24 DIAGNOSIS — M545 Low back pain, unspecified: Secondary | ICD-10-CM

## 2021-07-24 DIAGNOSIS — R300 Dysuria: Secondary | ICD-10-CM | POA: Diagnosis not present

## 2021-07-24 DIAGNOSIS — I152 Hypertension secondary to endocrine disorders: Secondary | ICD-10-CM

## 2021-07-24 LAB — POCT GLYCOSYLATED HEMOGLOBIN (HGB A1C): HbA1c, POC (controlled diabetic range): 5.4 % (ref 0.0–7.0)

## 2021-07-24 MED ORDER — TRAMADOL HCL 50 MG PO TABS: 50.0000 mg | ORAL_TABLET | Freq: Two times a day (BID) | ORAL | 0 refills | Status: AC | PRN

## 2021-07-24 NOTE — Patient Instructions (Signed)
For your back pain uncertain medication for the next 7 days called tramadol.  I will see you back in 1 week to see how your symptoms are improving.  If you have any worsening back pain or fevers please go to the ER. ? ?Your A1c is 5.4.  Continue the good work. ? ?For your blood pressure I think this will improve after treating your back pain.  If it does not or if it continues to be elevated at dialysis I do recommend following up with your kidney specialist.  If you are unable to get in with them please let us know. ?

## 2021-07-27 ENCOUNTER — Telehealth: Payer: Self-pay | Admitting: *Deleted

## 2021-07-27 NOTE — Telephone Encounter (Signed)
I have left a voicemail for patient to call back to schedule hospital colonoscopy. Currentl have an opening on 10/11/21. ? ? ?pt needs SCREENING colon recall (05/2021) at hospital due to Hx CVA, ESRD (awaiting transplant) on dialysis; OSA ?

## 2021-07-31 NOTE — Telephone Encounter (Signed)
I have left a message for patient to call back. 

## 2021-08-07 NOTE — Telephone Encounter (Signed)
We have not received any correspondence from patient in reply to our calls. I will send a letter to her home address. ?

## 2021-10-09 ENCOUNTER — Encounter: Payer: Self-pay | Admitting: *Deleted

## 2022-03-03 ENCOUNTER — Emergency Department (HOSPITAL_COMMUNITY)
Admission: EM | Admit: 2022-03-03 | Discharge: 2022-03-04 | Discharge status: Against Medical Advice | Payer: 59 | Attending: Emergency Medicine | Admitting: Emergency Medicine

## 2022-03-03 ENCOUNTER — Encounter (HOSPITAL_COMMUNITY): Payer: Self-pay | Admitting: Emergency Medicine

## 2022-03-03 DIAGNOSIS — R110 Nausea: Secondary | ICD-10-CM | POA: Diagnosis not present

## 2022-03-03 DIAGNOSIS — Z5321 Procedure and treatment not carried out due to patient leaving prior to being seen by health care provider: Secondary | ICD-10-CM | POA: Diagnosis not present

## 2022-03-03 DIAGNOSIS — R42 Dizziness and giddiness: Secondary | ICD-10-CM | POA: Diagnosis present

## 2022-03-03 LAB — CBC
HCT: 33.8 % — ABNORMAL LOW (ref 36.0–46.0)
Hemoglobin: 10.9 g/dL — ABNORMAL LOW (ref 12.0–15.0)
MCH: 30.1 pg (ref 26.0–34.0)
MCHC: 32.2 g/dL (ref 30.0–36.0)
MCV: 93.4 fL (ref 80.0–100.0)
Platelets: 303 10*3/uL (ref 150–400)
RBC: 3.62 MIL/uL — ABNORMAL LOW (ref 3.87–5.11)
RDW: 13.7 % (ref 11.5–15.5)
WBC: 10.9 10*3/uL — ABNORMAL HIGH (ref 4.0–10.5)
nRBC: 0 % (ref 0.0–0.2)

## 2022-03-03 LAB — BASIC METABOLIC PANEL
Anion gap: 18 — ABNORMAL HIGH (ref 5–15)
BUN: 55 mg/dL — ABNORMAL HIGH (ref 8–23)
CO2: 28 mmol/L (ref 22–32)
Calcium: 9.7 mg/dL (ref 8.9–10.3)
Chloride: 93 mmol/L — ABNORMAL LOW (ref 98–111)
Creatinine, Ser: 10.44 mg/dL — ABNORMAL HIGH (ref 0.44–1.00)
GFR, Estimated: 4 mL/min — ABNORMAL LOW (ref >60)
Glucose, Bld: 79 mg/dL (ref 70–99)
Potassium: 5.1 mmol/L (ref 3.5–5.1)
Sodium: 139 mmol/L (ref 135–145)

## 2022-03-03 NOTE — ED Notes (Signed)
Patient family member states the wait is long and they are leaving

## 2022-03-03 NOTE — ED Triage Notes (Signed)
Pt here from home with c/o dizziness since Friday after noon , along with some slight  nausea , no weakness on either side or other de fecit

## 2022-03-05 ENCOUNTER — Ambulatory Visit (INDEPENDENT_AMBULATORY_CARE_PROVIDER_SITE_OTHER): Payer: 59 | Admitting: Family Medicine

## 2022-03-05 ENCOUNTER — Encounter: Payer: Self-pay | Admitting: Family Medicine

## 2022-03-05 VITALS — BP 116/62 | HR 105 | Wt 215.0 lb

## 2022-03-05 DIAGNOSIS — R42 Dizziness and giddiness: Secondary | ICD-10-CM | POA: Diagnosis not present

## 2022-03-05 DIAGNOSIS — Z8673 Personal history of transient ischemic attack (TIA), and cerebral infarction without residual deficits: Secondary | ICD-10-CM | POA: Diagnosis not present

## 2022-03-05 DIAGNOSIS — H8120 Vestibular neuronitis, unspecified ear: Secondary | ICD-10-CM

## 2022-03-05 MED ORDER — FLUTICASONE PROPIONATE 50 MCG/ACT NA SUSP: 2.0000 | Freq: Every day | NASAL | 6 refills | Status: AC

## 2022-03-05 MED ORDER — ROSUVASTATIN CALCIUM 5 MG PO TABS: 5.0000 mg | ORAL_TABLET | Freq: Every day | ORAL | 2 refills | Status: DC

## 2022-03-05 NOTE — Patient Instructions (Signed)
Good to see you today - Thank you for coming in  Things we discussed today:  1) Your dizziness is most likely due to post-viral irritation. - Let's get an MRI of your brain to make sure there is nothing more serious happening - I have sent a referral for you to see Vestibular Rehab. They can help with techniques to improve the dizziness. - Using Flonase 2 puffs in each nostril daily until symptoms improve  2) Start taking Rosuvastatin '5mg'$  daily. This medication is helpful for preventing strokes and heart attacks.  Please always bring your medication bottles  Come back in 6 months to talk about cholesterol and check in.

## 2022-03-05 NOTE — Progress Notes (Deleted)
    SUBJECTIVE:   CHIEF COMPLAINT / HPI:   *** HA started when she left dialysis Friday a few hours after. Took a nap and then woke up with Head spinning. Dizziness comes and goes. Sometimes it will stop for 1-2 hours, but then will return. Not taken anything for it. Standing up and lights trigger. Better with being in a dark room. Swaying causes it to get worse. Went to dialysis on Monday, did not feel like it made it worse.   Tried to go to ER on Sunday, but left because it was taking too late.      PERTINENT  PMH / PSH: ***  OBJECTIVE:   BP 116/62   Pulse (!) 105   Wt 215 lb (97.5 kg)   LMP  (LMP Unknown)   SpO2 98%   BMI 31.75 kg/m   Gen: Uncomfortable appearing, but friendly older woman. NAD   ASSESSMENT/PLAN:   No problem-specific Assessment & Plan notes found for this encounter.     Arlyce Dice, MD [place]

## 2022-03-07 ENCOUNTER — Telehealth: Payer: Self-pay

## 2022-03-07 NOTE — Telephone Encounter (Signed)
Patient made aware of appointment.  MRI Brain w/o contrast [place]  03/18/2022 1700 hours with arrival at 1630 Entrance C  .Ozella Almond, CMA

## 2022-03-10 DIAGNOSIS — H8120 Vestibular neuronitis, unspecified ear: Secondary | ICD-10-CM | POA: Insufficient documentation

## 2022-03-10 NOTE — Assessment & Plan Note (Signed)
5 day hx of vertigo that is near-constant, feels like room is spinning. Exaccerbated by light, standing up, and swaying head. Denies hearing changes. Went to ED Sunday, workup at the time showed no electrolyte abnormalities, EKG showed NSR; but pt left early due to long wait time. Pt also had viral URI ~1wk prior. Pt has hx of CVA, but reassuringly does not have any focal neurological deficit today. Highest on differential is post-viral vestibular neuritis given recent viral URI, near-constant nature of vertigo. Meniere Disease is also considered, but less likely given lack of hearing changes and vertigo is not episodic. Acute nature of symptoms makes mass effect such as malignancy less likely.  - Supportive care. Discussed that symptoms are most likely self limited - Flonase for congestion - Referral sent for Vestibular Rehab - Brain MRI to rule-out other causes given hx of CVA

## 2022-03-10 NOTE — Assessment & Plan Note (Addendum)
Reports not taking Crestor. Given hx of CVA, pt would benefit from statin therapy for secondary prevention. Discussed benefit of stroke prevention and pt is agreeable to restarting. - Restart Crestor '5mg'$  daily. Consider increasing at f/u to optimize statin therapy.

## 2022-03-10 NOTE — Progress Notes (Signed)
    SUBJECTIVE:   CHIEF COMPLAINT / HPI:   [name] is a 61yo F h/f dizziness. Pt reports dizziness starting a few hours after dialysis Friday, she took a nap and then woke up with head spinning. Dizziness comes and goes. Sometimes it will stop for 1-2 hours, but then will return. Not taken anything for it. Standing up and lights trigger it. Better with being in a dark room. Swaying causes it to get worse. Went to dialysis on Monday, did not feel like it made it worse. Tried to go to ER on Sunday, but left because it was taking too late. She also reports recovering from a cold about a week prior.   PERTINENT  PMH / PSH: ESRD, hx of CVA, T2DM, HTN  OBJECTIVE:   BP 116/62   Pulse (!) 105   Wt 215 lb (97.5 kg)   LMP  (LMP Unknown)   SpO2 98%   BMI 31.75 kg/m   Gen: Uncomfortable appearing, but friendly older woman. NAD HEENT: PERRLA. MMM. NCAT. Neuro: CN2-12 intact. 5/5 strength in BL UE and LE. Sensation grossly intact and symmetric.  Head Impulse test: No nystagmus noted Test of Skew: No skew noted BL. Resp: Normal WOB on RA. CTAB CV: RRR  ASSESSMENT/PLAN:   Vestibular neuritis 5 day hx of vertigo that is near-constant, feels like room is spinning. Exaccerbated by light, standing up, and swaying head. Denies hearing changes. Went to ED Sunday, workup at the time showed no electrolyte abnormalities, EKG showed NSR; but pt left early due to long wait time. Pt also had viral URI ~1wk prior. Pt has hx of CVA, but reassuringly does not have any focal neurological deficit today. Highest on differential is post-viral vestibular neuritis given recent viral URI, near-constant nature of vertigo. Meniere Disease is also considered, but less likely given lack of hearing changes and vertigo is not episodic. Acute nature of symptoms makes mass effect such as malignancy less likely.  - Supportive care. 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 Given hx of CVA, pt would benefit from statin therapy for secondary prevention. Discussed benefit of stroke prevention and pt is agreeable to restarting. - Restart Crestor '5mg'$  daily. Consider increasing at f/u to optimize statin therapy.     Arlyce Dice, MD [place]

## 2022-03-12 ENCOUNTER — Emergency Department (HOSPITAL_COMMUNITY): Payer: 59

## 2022-03-12 ENCOUNTER — Other Ambulatory Visit: Payer: Self-pay

## 2022-03-12 ENCOUNTER — Inpatient Hospital Stay (HOSPITAL_COMMUNITY)
Admission: EM | Admit: 2022-03-12 | Discharge: 2022-03-16 | DRG: 682 | Disposition: A | Discharge status: Home / Self Care | Payer: 59 | Attending: Family Medicine | Admitting: Family Medicine

## 2022-03-12 ENCOUNTER — Encounter (HOSPITAL_COMMUNITY): Payer: Self-pay

## 2022-03-12 DIAGNOSIS — E119 Type 2 diabetes mellitus without complications: Secondary | ICD-10-CM

## 2022-03-12 DIAGNOSIS — E1122 Type 2 diabetes mellitus with diabetic chronic kidney disease: Secondary | ICD-10-CM

## 2022-03-12 DIAGNOSIS — I120 Hypertensive chronic kidney disease with stage 5 chronic kidney disease or end stage renal disease: Secondary | ICD-10-CM | POA: Diagnosis not present

## 2022-03-12 DIAGNOSIS — E875 Hyperkalemia: Secondary | ICD-10-CM

## 2022-03-12 DIAGNOSIS — I160 Hypertensive urgency: Secondary | ICD-10-CM | POA: Diagnosis present

## 2022-03-12 DIAGNOSIS — Z9841 Cataract extraction status, right eye: Secondary | ICD-10-CM

## 2022-03-12 DIAGNOSIS — E1143 Type 2 diabetes mellitus with diabetic autonomic (poly)neuropathy: Secondary | ICD-10-CM | POA: Diagnosis present

## 2022-03-12 DIAGNOSIS — Z833 Family history of diabetes mellitus: Secondary | ICD-10-CM

## 2022-03-12 DIAGNOSIS — Z888 Allergy status to other drugs, medicaments and biological substances status: Secondary | ICD-10-CM

## 2022-03-12 DIAGNOSIS — I472 Ventricular tachycardia, unspecified: Secondary | ICD-10-CM | POA: Insufficient documentation

## 2022-03-12 DIAGNOSIS — K219 Gastro-esophageal reflux disease without esophagitis: Secondary | ICD-10-CM | POA: Diagnosis present

## 2022-03-12 DIAGNOSIS — E785 Hyperlipidemia, unspecified: Secondary | ICD-10-CM | POA: Diagnosis present

## 2022-03-12 DIAGNOSIS — Z9049 Acquired absence of other specified parts of digestive tract: Secondary | ICD-10-CM

## 2022-03-12 DIAGNOSIS — Z8249 Family history of ischemic heart disease and other diseases of the circulatory system: Secondary | ICD-10-CM

## 2022-03-12 DIAGNOSIS — Z8673 Personal history of transient ischemic attack (TIA), and cerebral infarction without residual deficits: Secondary | ICD-10-CM

## 2022-03-12 DIAGNOSIS — Z992 Dependence on renal dialysis: Secondary | ICD-10-CM

## 2022-03-12 DIAGNOSIS — Z87891 Personal history of nicotine dependence: Secondary | ICD-10-CM

## 2022-03-12 DIAGNOSIS — N2581 Secondary hyperparathyroidism of renal origin: Secondary | ICD-10-CM | POA: Diagnosis present

## 2022-03-12 DIAGNOSIS — R7989 Other specified abnormal findings of blood chemistry: Secondary | ICD-10-CM | POA: Insufficient documentation

## 2022-03-12 DIAGNOSIS — I10 Essential (primary) hypertension: Secondary | ICD-10-CM | POA: Diagnosis present

## 2022-03-12 DIAGNOSIS — E1159 Type 2 diabetes mellitus with other circulatory complications: Secondary | ICD-10-CM | POA: Diagnosis present

## 2022-03-12 DIAGNOSIS — Z7982 Long term (current) use of aspirin: Secondary | ICD-10-CM

## 2022-03-12 DIAGNOSIS — R112 Nausea with vomiting, unspecified: Secondary | ICD-10-CM

## 2022-03-12 DIAGNOSIS — I152 Hypertension secondary to endocrine disorders: Secondary | ICD-10-CM | POA: Diagnosis present

## 2022-03-12 DIAGNOSIS — N186 End stage renal disease: Secondary | ICD-10-CM | POA: Diagnosis present

## 2022-03-12 DIAGNOSIS — Z79899 Other long term (current) drug therapy: Secondary | ICD-10-CM

## 2022-03-12 DIAGNOSIS — I4729 Other ventricular tachycardia: Secondary | ICD-10-CM | POA: Insufficient documentation

## 2022-03-12 DIAGNOSIS — I69354 Hemiplegia and hemiparesis following cerebral infarction affecting left non-dominant side: Secondary | ICD-10-CM

## 2022-03-12 DIAGNOSIS — R109 Unspecified abdominal pain: Secondary | ICD-10-CM | POA: Diagnosis present

## 2022-03-12 DIAGNOSIS — K3184 Gastroparesis: Secondary | ICD-10-CM | POA: Diagnosis present

## 2022-03-12 DIAGNOSIS — I700 Atherosclerosis of aorta: Secondary | ICD-10-CM | POA: Diagnosis present

## 2022-03-12 DIAGNOSIS — E1169 Type 2 diabetes mellitus with other specified complication: Secondary | ICD-10-CM | POA: Diagnosis present

## 2022-03-12 DIAGNOSIS — R1013 Epigastric pain: Principal | ICD-10-CM

## 2022-03-12 DIAGNOSIS — D631 Anemia in chronic kidney disease: Secondary | ICD-10-CM | POA: Diagnosis present

## 2022-03-12 DIAGNOSIS — R6510 Systemic inflammatory response syndrome (SIRS) of non-infectious origin without acute organ dysfunction: Secondary | ICD-10-CM | POA: Insufficient documentation

## 2022-03-12 LAB — CBC WITH DIFFERENTIAL/PLATELET
Abs Immature Granulocytes: 0.08 10*3/uL — ABNORMAL HIGH (ref 0.00–0.07)
Basophils Absolute: 0 10*3/uL (ref 0.0–0.1)
Basophils Relative: 0 %
Eosinophils Absolute: 0 10*3/uL (ref 0.0–0.5)
Eosinophils Relative: 0 %
HCT: 38.3 % (ref 36.0–46.0)
Hemoglobin: 12.5 g/dL (ref 12.0–15.0)
Immature Granulocytes: 1 %
Lymphocytes Relative: 8 %
Lymphs Abs: 1.1 10*3/uL (ref 0.7–4.0)
MCH: 30 pg (ref 26.0–34.0)
MCHC: 32.6 g/dL (ref 30.0–36.0)
MCV: 92.1 fL (ref 80.0–100.0)
Monocytes Absolute: 0.4 10*3/uL (ref 0.1–1.0)
Monocytes Relative: 3 %
Neutro Abs: 12.3 10*3/uL — ABNORMAL HIGH (ref 1.7–7.7)
Neutrophils Relative %: 88 %
Platelets: 383 10*3/uL (ref 150–400)
RBC: 4.16 MIL/uL (ref 3.87–5.11)
RDW: 13.2 % (ref 11.5–15.5)
WBC: 13.9 10*3/uL — ABNORMAL HIGH (ref 4.0–10.5)
nRBC: 0 % (ref 0.0–0.2)

## 2022-03-12 LAB — COMPREHENSIVE METABOLIC PANEL
ALT: 10 U/L (ref 0–44)
AST: 11 U/L — ABNORMAL LOW (ref 15–41)
Albumin: 3.8 g/dL (ref 3.5–5.0)
Alkaline Phosphatase: 80 U/L (ref 38–126)
Anion gap: 24 — ABNORMAL HIGH (ref 5–15)
BUN: 95 mg/dL — ABNORMAL HIGH (ref 8–23)
CO2: 23 mmol/L (ref 22–32)
Calcium: 9.8 mg/dL (ref 8.9–10.3)
Chloride: 95 mmol/L — ABNORMAL LOW (ref 98–111)
Creatinine, Ser: 16.14 mg/dL — ABNORMAL HIGH (ref 0.44–1.00)
GFR, Estimated: 2 mL/min — ABNORMAL LOW (ref >60)
Glucose, Bld: 128 mg/dL — ABNORMAL HIGH (ref 70–99)
Potassium: 5.6 mmol/L — ABNORMAL HIGH (ref 3.5–5.1)
Sodium: 142 mmol/L (ref 135–145)
Total Bilirubin: 0.6 mg/dL (ref 0.3–1.2)
Total Protein: 8.3 g/dL — ABNORMAL HIGH (ref 6.5–8.1)

## 2022-03-12 LAB — TROPONIN I (HIGH SENSITIVITY): Troponin I (High Sensitivity): 63 ng/L — ABNORMAL HIGH (ref <18)

## 2022-03-12 LAB — LIPASE, BLOOD: Lipase: 42 U/L (ref 11–51)

## 2022-03-12 MED ORDER — ONDANSETRON 4 MG PO TBDP
4.0000 mg | ORAL_TABLET | Freq: Once | ORAL | Status: AC
  Administered 2022-03-12: 4 mg via ORAL
  Filled 2022-03-12: qty 1

## 2022-03-12 MED ORDER — AMLODIPINE BESYLATE 5 MG PO TABS
10.0000 mg | ORAL_TABLET | Freq: Once | ORAL | Status: AC
  Administered 2022-03-12: 10 mg via ORAL
  Filled 2022-03-12: qty 2

## 2022-03-12 NOTE — ED Triage Notes (Signed)
Pt to er, pt states that she has been vomiting and having abd pain since Friday, states that she can't keep anything down, states that now she is having some epigastric pain.  States that she is a MWF dialysis pt and couldn't go Monday because she was too weak.

## 2022-03-13 ENCOUNTER — Inpatient Hospital Stay (HOSPITAL_COMMUNITY): Payer: 59

## 2022-03-13 ENCOUNTER — Encounter (HOSPITAL_COMMUNITY): Payer: Self-pay | Admitting: Emergency Medicine

## 2022-03-13 DIAGNOSIS — N186 End stage renal disease: Secondary | ICD-10-CM | POA: Diagnosis present

## 2022-03-13 DIAGNOSIS — D631 Anemia in chronic kidney disease: Secondary | ICD-10-CM | POA: Diagnosis present

## 2022-03-13 DIAGNOSIS — I69354 Hemiplegia and hemiparesis following cerebral infarction affecting left non-dominant side: Secondary | ICD-10-CM | POA: Diagnosis not present

## 2022-03-13 DIAGNOSIS — E875 Hyperkalemia: Secondary | ICD-10-CM | POA: Diagnosis present

## 2022-03-13 DIAGNOSIS — I160 Hypertensive urgency: Secondary | ICD-10-CM | POA: Diagnosis present

## 2022-03-13 DIAGNOSIS — R6510 Systemic inflammatory response syndrome (SIRS) of non-infectious origin without acute organ dysfunction: Secondary | ICD-10-CM | POA: Insufficient documentation

## 2022-03-13 DIAGNOSIS — Z992 Dependence on renal dialysis: Secondary | ICD-10-CM

## 2022-03-13 DIAGNOSIS — K3184 Gastroparesis: Secondary | ICD-10-CM | POA: Diagnosis present

## 2022-03-13 DIAGNOSIS — K219 Gastro-esophageal reflux disease without esophagitis: Secondary | ICD-10-CM | POA: Diagnosis present

## 2022-03-13 DIAGNOSIS — R7989 Other specified abnormal findings of blood chemistry: Secondary | ICD-10-CM | POA: Insufficient documentation

## 2022-03-13 DIAGNOSIS — Z8249 Family history of ischemic heart disease and other diseases of the circulatory system: Secondary | ICD-10-CM | POA: Diagnosis not present

## 2022-03-13 DIAGNOSIS — E1122 Type 2 diabetes mellitus with diabetic chronic kidney disease: Secondary | ICD-10-CM | POA: Diagnosis present

## 2022-03-13 DIAGNOSIS — I120 Hypertensive chronic kidney disease with stage 5 chronic kidney disease or end stage renal disease: Secondary | ICD-10-CM | POA: Diagnosis present

## 2022-03-13 DIAGNOSIS — R112 Nausea with vomiting, unspecified: Secondary | ICD-10-CM

## 2022-03-13 DIAGNOSIS — N2581 Secondary hyperparathyroidism of renal origin: Secondary | ICD-10-CM | POA: Diagnosis present

## 2022-03-13 DIAGNOSIS — E1143 Type 2 diabetes mellitus with diabetic autonomic (poly)neuropathy: Secondary | ICD-10-CM | POA: Diagnosis present

## 2022-03-13 DIAGNOSIS — Z7982 Long term (current) use of aspirin: Secondary | ICD-10-CM | POA: Diagnosis not present

## 2022-03-13 DIAGNOSIS — Z833 Family history of diabetes mellitus: Secondary | ICD-10-CM | POA: Diagnosis not present

## 2022-03-13 DIAGNOSIS — E785 Hyperlipidemia, unspecified: Secondary | ICD-10-CM | POA: Diagnosis present

## 2022-03-13 DIAGNOSIS — Z9841 Cataract extraction status, right eye: Secondary | ICD-10-CM | POA: Diagnosis not present

## 2022-03-13 DIAGNOSIS — Z87891 Personal history of nicotine dependence: Secondary | ICD-10-CM | POA: Diagnosis not present

## 2022-03-13 DIAGNOSIS — I472 Ventricular tachycardia, unspecified: Secondary | ICD-10-CM | POA: Diagnosis not present

## 2022-03-13 DIAGNOSIS — R1013 Epigastric pain: Secondary | ICD-10-CM | POA: Diagnosis not present

## 2022-03-13 DIAGNOSIS — I700 Atherosclerosis of aorta: Secondary | ICD-10-CM | POA: Diagnosis present

## 2022-03-13 DIAGNOSIS — Z9049 Acquired absence of other specified parts of digestive tract: Secondary | ICD-10-CM | POA: Diagnosis not present

## 2022-03-13 DIAGNOSIS — Z79899 Other long term (current) drug therapy: Secondary | ICD-10-CM | POA: Diagnosis not present

## 2022-03-13 HISTORY — DX: Nausea with vomiting, unspecified: R11.2

## 2022-03-13 LAB — URINALYSIS, ROUTINE W REFLEX MICROSCOPIC
Bilirubin Urine: NEGATIVE
Glucose, UA: 50 mg/dL — AB
Hgb urine dipstick: NEGATIVE
Ketones, ur: NEGATIVE mg/dL
Nitrite: NEGATIVE
Protein, ur: 100 mg/dL — AB
Specific Gravity, Urine: 1.011 (ref 1.005–1.030)
WBC, UA: >50 WBC/hpf — ABNORMAL HIGH (ref 0–5)
pH: 8 (ref 5.0–8.0)

## 2022-03-13 LAB — CBC WITH DIFFERENTIAL/PLATELET
Abs Immature Granulocytes: 0.04 10*3/uL (ref 0.00–0.07)
Basophils Absolute: 0 10*3/uL (ref 0.0–0.1)
Basophils Relative: 0 %
Eosinophils Absolute: 0 10*3/uL (ref 0.0–0.5)
Eosinophils Relative: 0 %
HCT: 34.8 % — ABNORMAL LOW (ref 36.0–46.0)
Hemoglobin: 11.4 g/dL — ABNORMAL LOW (ref 12.0–15.0)
Immature Granulocytes: 0 %
Lymphocytes Relative: 13 %
Lymphs Abs: 1.6 10*3/uL (ref 0.7–4.0)
MCH: 30 pg (ref 26.0–34.0)
MCHC: 32.8 g/dL (ref 30.0–36.0)
MCV: 91.6 fL (ref 80.0–100.0)
Monocytes Absolute: 0.8 10*3/uL (ref 0.1–1.0)
Monocytes Relative: 6 %
Neutro Abs: 9.7 10*3/uL — ABNORMAL HIGH (ref 1.7–7.7)
Neutrophils Relative %: 81 %
Platelets: 332 10*3/uL (ref 150–400)
RBC: 3.8 MIL/uL — ABNORMAL LOW (ref 3.87–5.11)
RDW: 13.2 % (ref 11.5–15.5)
WBC: 12.2 10*3/uL — ABNORMAL HIGH (ref 4.0–10.5)
nRBC: 0 % (ref 0.0–0.2)

## 2022-03-13 LAB — COMPREHENSIVE METABOLIC PANEL
ALT: 11 U/L (ref 0–44)
AST: 14 U/L — ABNORMAL LOW (ref 15–41)
Albumin: 3.3 g/dL — ABNORMAL LOW (ref 3.5–5.0)
Alkaline Phosphatase: 70 U/L (ref 38–126)
Anion gap: 24 — ABNORMAL HIGH (ref 5–15)
BUN: 99 mg/dL — ABNORMAL HIGH (ref 8–23)
CO2: 24 mmol/L (ref 22–32)
Calcium: 9.5 mg/dL (ref 8.9–10.3)
Chloride: 96 mmol/L — ABNORMAL LOW (ref 98–111)
Creatinine, Ser: 16.93 mg/dL — ABNORMAL HIGH (ref 0.44–1.00)
GFR, Estimated: 2 mL/min — ABNORMAL LOW (ref >60)
Glucose, Bld: 90 mg/dL (ref 70–99)
Potassium: 5.8 mmol/L — ABNORMAL HIGH (ref 3.5–5.1)
Sodium: 144 mmol/L (ref 135–145)
Total Bilirubin: 0.8 mg/dL (ref 0.3–1.2)
Total Protein: 7.8 g/dL (ref 6.5–8.1)

## 2022-03-13 LAB — RENAL FUNCTION PANEL
Albumin: 3.1 g/dL — ABNORMAL LOW (ref 3.5–5.0)
Anion gap: 27 — ABNORMAL HIGH (ref 5–15)
BUN: 111 mg/dL — ABNORMAL HIGH (ref 8–23)
CO2: 21 mmol/L — ABNORMAL LOW (ref 22–32)
Calcium: 9.2 mg/dL (ref 8.9–10.3)
Chloride: 93 mmol/L — ABNORMAL LOW (ref 98–111)
Creatinine, Ser: 18.09 mg/dL — ABNORMAL HIGH (ref 0.44–1.00)
GFR, Estimated: 2 mL/min — ABNORMAL LOW (ref >60)
Glucose, Bld: 136 mg/dL — ABNORMAL HIGH (ref 70–99)
Phosphorus: >30 mg/dL — ABNORMAL HIGH (ref 2.5–4.6)
Potassium: 6.2 mmol/L — ABNORMAL HIGH (ref 3.5–5.1)
Sodium: 141 mmol/L (ref 135–145)

## 2022-03-13 LAB — HEMOGLOBIN A1C
Hgb A1c MFr Bld: 5.2 % (ref 4.8–5.6)
Mean Plasma Glucose: 102.54 mg/dL

## 2022-03-13 LAB — C-REACTIVE PROTEIN: CRP: 4.3 mg/dL — ABNORMAL HIGH (ref <1.0)

## 2022-03-13 LAB — SEDIMENTATION RATE: Sed Rate: 108 mm/hr — ABNORMAL HIGH (ref 0–22)

## 2022-03-13 LAB — HIV ANTIBODY (ROUTINE TESTING W REFLEX): HIV Screen 4th Generation wRfx: NONREACTIVE

## 2022-03-13 LAB — TROPONIN I (HIGH SENSITIVITY)
Troponin I (High Sensitivity): 46 ng/L — ABNORMAL HIGH (ref <18)
Troponin I (High Sensitivity): 40 ng/L — ABNORMAL HIGH (ref <18)

## 2022-03-13 LAB — LACTIC ACID, PLASMA
Lactic Acid, Venous: 1.4 mmol/L (ref 0.5–1.9)
Lactic Acid, Venous: 0.8 mmol/L (ref 0.5–1.9)

## 2022-03-13 LAB — HEPATITIS B SURFACE ANTIGEN: Hepatitis B Surface Ag: NONREACTIVE

## 2022-03-13 LAB — GLUCOSE, CAPILLARY
Glucose-Capillary: 104 mg/dL — ABNORMAL HIGH (ref 70–99)
Glucose-Capillary: 74 mg/dL (ref 70–99)

## 2022-03-13 MED ORDER — SODIUM CHLORIDE 0.9 % IV SOLN
2.0000 g | INTRAVENOUS | Status: DC
  Administered 2022-03-13: 2 g via INTRAVENOUS
  Filled 2022-03-13: qty 20

## 2022-03-13 MED ORDER — SODIUM ZIRCONIUM CYCLOSILICATE 5 G PO PACK: 5.0000 g | PACK | Freq: Once | ORAL | Status: DC

## 2022-03-13 MED ORDER — PROCHLORPERAZINE EDISYLATE 10 MG/2ML IJ SOLN: 10.0000 mg | Freq: Four times a day (QID) | INTRAMUSCULAR | Status: DC | PRN

## 2022-03-13 MED ORDER — HEPARIN SODIUM (PORCINE) 1000 UNIT/ML DIALYSIS: 5000.0000 [IU] | INTRAMUSCULAR | Status: DC | PRN

## 2022-03-13 MED ORDER — LABETALOL HCL 5 MG/ML IV SOLN: 20.0000 mg | Freq: Once | INTRAVENOUS | Status: DC

## 2022-03-13 MED ORDER — METRONIDAZOLE 500 MG/100ML IV SOLN
500.0000 mg | Freq: Two times a day (BID) | INTRAVENOUS | Status: DC
  Administered 2022-03-14: 500 mg via INTRAVENOUS
  Filled 2022-03-13: qty 100

## 2022-03-13 MED ORDER — ASPIRIN 325 MG PO TABS
325.0000 mg | ORAL_TABLET | Freq: Every day | ORAL | Status: DC
  Administered 2022-03-14 – 2022-03-16 (×3): 325 mg via ORAL
  Filled 2022-03-13 (×3): qty 1

## 2022-03-13 MED ORDER — LIDOCAINE HCL (PF) 1 % IJ SOLN: 5.0000 mL | INTRAMUSCULAR | Status: DC | PRN

## 2022-03-13 MED ORDER — HEPARIN SODIUM (PORCINE) 5000 UNIT/ML IJ SOLN
5000.0000 [IU] | Freq: Three times a day (TID) | INTRAMUSCULAR | Status: DC
  Administered 2022-03-14 – 2022-03-16 (×5): 5000 [IU] via SUBCUTANEOUS
  Filled 2022-03-13 (×6): qty 1

## 2022-03-13 MED ORDER — FENTANYL CITRATE PF 50 MCG/ML IJ SOSY
50.0000 ug | PREFILLED_SYRINGE | Freq: Once | INTRAMUSCULAR | Status: AC
  Administered 2022-03-13: 50 ug via INTRAVENOUS
  Filled 2022-03-13: qty 1

## 2022-03-13 MED ORDER — HEPARIN SODIUM (PORCINE) 1000 UNIT/ML DIALYSIS: 1000.0000 [IU] | INTRAMUSCULAR | Status: DC | PRN

## 2022-03-13 MED ORDER — ALTEPLASE 2 MG IJ SOLR: 2.0000 mg | Freq: Once | INTRAMUSCULAR | Status: DC | PRN

## 2022-03-13 MED ORDER — ONDANSETRON HCL 4 MG/2ML IJ SOLN
4.0000 mg | Freq: Once | INTRAMUSCULAR | Status: AC
  Administered 2022-03-13: 4 mg via INTRAVENOUS
  Filled 2022-03-13: qty 2

## 2022-03-13 MED ORDER — AMLODIPINE BESYLATE 10 MG PO TABS
10.0000 mg | ORAL_TABLET | Freq: Every day | ORAL | Status: DC
  Administered 2022-03-13 – 2022-03-16 (×4): 10 mg via ORAL
  Filled 2022-03-13: qty 1
  Filled 2022-03-13: qty 2
  Filled 2022-03-13 (×2): qty 1

## 2022-03-13 MED ORDER — INSULIN ASPART 100 UNIT/ML IJ SOLN: 0.0000 [IU] | Freq: Three times a day (TID) | INTRAMUSCULAR | Status: DC

## 2022-03-13 MED ORDER — LIDOCAINE-PRILOCAINE 2.5-2.5 % EX CREA: 1.0000 | TOPICAL_CREAM | CUTANEOUS | Status: DC | PRN

## 2022-03-13 MED ORDER — FENTANYL CITRATE PF 50 MCG/ML IJ SOSY
50.0000 ug | PREFILLED_SYRINGE | INTRAMUSCULAR | Status: DC | PRN
  Administered 2022-03-13 – 2022-03-14 (×3): 50 ug via INTRAVENOUS
  Filled 2022-03-13 (×3): qty 1

## 2022-03-13 MED ORDER — DOXERCALCIFEROL 4 MCG/2ML IV SOLN
7.0000 ug | INTRAVENOUS | Status: DC
  Filled 2022-03-13: qty 4

## 2022-03-13 MED ORDER — METOPROLOL TARTRATE 5 MG/5ML IV SOLN
5.0000 mg | Freq: Four times a day (QID) | INTRAVENOUS | Status: DC | PRN
  Administered 2022-03-13: 5 mg via INTRAVENOUS
  Filled 2022-03-13: qty 5

## 2022-03-13 MED ORDER — ANTICOAGULANT SODIUM CITRATE 4% (200MG/5ML) IV SOLN: 5.0000 mL | Status: DC | PRN

## 2022-03-13 MED ORDER — PENTAFLUOROPROP-TETRAFLUOROETH EX AERO: 1.0000 | INHALATION_SPRAY | CUTANEOUS | Status: DC | PRN

## 2022-03-13 MED ORDER — CINACALCET HCL 30 MG PO TABS
60.0000 mg | ORAL_TABLET | Freq: Every day | ORAL | Status: DC
  Administered 2022-03-14: 60 mg via ORAL
  Filled 2022-03-13 (×4): qty 2

## 2022-03-13 MED ORDER — SODIUM ZIRCONIUM CYCLOSILICATE 5 G PO PACK
5.0000 g | PACK | Freq: Two times a day (BID) | ORAL | Status: AC
  Administered 2022-03-13: 5 g via ORAL
  Filled 2022-03-13: qty 1

## 2022-03-13 MED ORDER — OXYCODONE HCL 5 MG PO TABS
5.0000 mg | ORAL_TABLET | Freq: Four times a day (QID) | ORAL | Status: DC | PRN
  Administered 2022-03-13: 5 mg via ORAL
  Filled 2022-03-13: qty 1

## 2022-03-13 MED ORDER — FLUTICASONE PROPIONATE 50 MCG/ACT NA SUSP
2.0000 | Freq: Every day | NASAL | Status: DC
  Administered 2022-03-14 – 2022-03-16 (×3): 2 via NASAL
  Filled 2022-03-13: qty 16

## 2022-03-13 MED ORDER — HYDROMORPHONE HCL 1 MG/ML IJ SOLN
1.0000 mg | Freq: Once | INTRAMUSCULAR | Status: AC
  Administered 2022-03-13: 1 mg via INTRAVENOUS
  Filled 2022-03-13: qty 1

## 2022-03-13 MED ORDER — IOHEXOL 350 MG/ML SOLN
80.0000 mL | Freq: Once | INTRAVENOUS | Status: AC | PRN
  Administered 2022-03-13: 80 mL via INTRAVENOUS

## 2022-03-13 MED ORDER — CHLORHEXIDINE GLUCONATE CLOTH 2 % EX PADS: 6.0000 | MEDICATED_PAD | Freq: Every day | CUTANEOUS | Status: DC

## 2022-03-13 NOTE — H&P (Addendum)
History and Physical    Patient: [name] IHK:[id number] DOB: 1960-10-20 DOA: 03/12/2022 DOS: the patient was seen and examined on 03/13/2022 PCP: Lowry Ram, MD  Patient coming from: Home  Chief Complaint:  Chief Complaint  Patient presents with   Abdominal Pain   HPI: [name] is a 61 y.o. female with medical history significant of ESRD on HD MWF, DM2, HTN, CVA. Presenting with abdominal pain. Symptoms started 6 days ago after her last dialysis session. It starts as sharp RUQ abdominal pain. It eventually worsened to include the LUQ. She's had N/V, but no D or fevers. She didn't try any medications to help. Her pain kept her out of her dialysis session 2 days ago. When her symptoms did not improve yesterday, she decided to come to the ED for evaluation. She denies any other aggravating or alleviating factors.   Review of Systems: As mentioned in the history of present illness. All other systems reviewed and are negative. Past Medical History:  Diagnosis Date   Accelerated hypertension    Aortic atherosclerosis (HCC)    Arthritis of knee    bilateral   Diabetes mellitus    type 2 - no meds   Diabetic gastroparesis ([place])    Diverticulosis    ESRD (end stage renal disease) ([place])    Fibroid uterus    GERD (gastroesophageal reflux disease)    Hyperlipidemia    Hypertension    Hypertensive emergency 02/05/2019   Hypokalemia    IDA (iron deficiency anemia)    Nausea    Renal disorder    Umbilical hernia    Wears dentures    Past Surgical History:  Procedure Laterality Date   AV FISTULA PLACEMENT Left 02/17/2019   Procedure: ARTERIOVENOUS (AV) FISTULA CREATION LEFT UPPER ARM;  Surgeon: Waynetta Sandy, MD;  Location: [facility];  Service: Vascular;  Laterality: Left;   CATARACT EXTRACTION     right eye   CESAREAN SECTION     x2   CHOLECYSTECTOMY     laparoscopic   FISTULA SUPERFICIALIZATION Left 04/06/2019   Procedure: FISTULA SUPERFICIALIZATION LEFT  BRACHIOCEPHALIC;  Surgeon: Waynetta Sandy, MD;  Location: [facility];  Service: Vascular;  Laterality: Left;  Transposition left arm brachiocephalic fistula.   IR FLUORO GUIDE CV LINE RIGHT  02/09/2019   IR FLUORO GUIDE CV LINE RIGHT  03/05/2019   IR US GUIDE VASC ACCESS RIGHT  02/09/2019   MULTIPLE TOOTH EXTRACTIONS     REDUCTION MAMMAPLASTY Bilateral    Social History:  reports that she quit smoking about 10 years ago. Her smoking use included cigarettes. She has been exposed to tobacco smoke. She has never used smokeless tobacco. She reports that she does not drink alcohol and does not use drugs.  Allergies  Allergen Reactions   Lisinopril Anaphylaxis and Other (See Comments)    angioedema   Venofer  [Ferric Oxide]     Other reaction(s): Back Pain   Camellia Swelling and Other (See Comments)    Angioedema    Jardiance [Empagliflozin] Swelling and Rash    Family History  Problem Relation Age of Onset   Hypertension Mother    Diabetes Mother    Cancer Father        lung   Colon cancer Neg Hx    Stomach cancer Neg Hx    Esophageal cancer Neg Hx     Prior to Admission medications   Medication Sig Start Date End Date Taking? Authorizing Provider  amLODipine (NORVASC)  10 MG tablet Take 10 mg by mouth daily. 05/14/21  Yes [provider]  aspirin 325 MG tablet Take 325 mg by mouth daily.   Yes [provider]  fluticasone (FLONASE) 50 MCG/ACT nasal spray Place 2 sprays into both nostrils daily. 03/05/22  Yes Arlyce Dice, MD  ondansetron (ZOFRAN ODT) 4 MG disintegrating tablet Take 1-2 tablets (4-8 mg total) by mouth every 8 (eight) hours as needed for nausea or vomiting. 02/08/21  Yes Levin Erp, PA  sucroferric oxyhydroxide (VELPHORO) 500 MG chewable tablet Chew 500 mg by mouth 3 (three) times daily with meals.   Yes [provider]  Accu-Chek Softclix Lancets lancets Use as instructed Patient taking differently: 1 each by Other route See  admin instructions. Use as instructed 09/29/18   Diallo, Earna Coder, MD  Blood Glucose Monitoring Suppl (ACCU-CHEK AVIVA PLUS) w/Device KIT Use to check sugar three times a day Patient taking differently: 1 each by Other route in the morning, at noon, and at bedtime. 05/14/17   [name], MD  Darbepoetin Alfa (ARANESP) 200 MCG/0.4ML SOSY injection Inject 0.4 mLs (200 mcg total) into the vein every Wednesday with hemodialysis. Patient not taking: Reported on 03/13/2022 10/06/19   Angiulli, Lavon Paganini, PA-C  glucose blood (ACCU-CHEK AVIVA PLUS) test strip Use as instructed Patient taking differently: 1 each by Other route See admin instructions. Use as instructed 09/29/18   Diallo, Earna Coder, MD  Lancets (ACCU-CHEK SOFT TOUCH) lancets Use to check sugars three times a day Patient taking differently: 1 each by Other route in the morning, at noon, and at bedtime. 09/24/18   Diallo, Earna Coder, MD  metoCLOPramide (REGLAN) 10 MG tablet Take 1 tablet (10 mg total) by mouth 4 (four) times daily -  before meals and at bedtime. Patient not taking: Reported on 05/31/2021 02/08/21   Levin Erp, PA  rosuvastatin (CRESTOR) 5 MG tablet Take 1 tablet (5 mg total) by mouth daily. Patient not taking: Reported on 03/13/2022 03/05/22   Arlyce Dice, MD    Physical Exam: Vitals:   03/13/22 0216 03/13/22 0500 03/13/22 0606 03/13/22 0700  BP: (!) 145/78 139/71  (!) 153/70  Pulse: 91 83  88  Resp: _0 Temp: 97.7 F (36.5 C)  97.7 F (36.5 C) 97.8 F (36.6 C)  TempSrc: Oral  Oral Oral  SpO2: 100% 97%  98%  Weight:      Height:       General: 61 y.o. female resting in bed in NAD Eyes: PERRL, normal sclera ENMT: Nares patent w/o discharge, orophaynx clear, dentition normal, ears w/o discharge/lesions/ulcers Neck: Supple, trachea midline Cardiovascular: tachy, +S1, S2, no m/g/r, equal pulses throughout Respiratory: CTABL, no w/r/r, normal WOB GI: BS+, ND, RUQ/LUQ TTP, no masses noted, no  organomegaly noted MSK: No e/c/c Neuro: A&O x 3, no focal deficits Psyc: Appropriate interaction and affect, calm/cooperative  Data Reviewed:  Results for orders placed or performed during the hospital encounter of 03/12/22 (from the past 24 hour(s))  Comprehensive metabolic panel     Status: Abnormal   Collection Time: 03/12/22  7:23 PM  Result Value Ref Range   Sodium 142 135 - 145 mmol/L   Potassium 5.6 (H) 3.5 - 5.1 mmol/L   Chloride 95 (L) 98 - 111 mmol/L   CO2 23 22 - 32 mmol/L   Glucose, Bld 128 (H) 70 - 99 mg/dL   BUN 95 (H) 8 - 23 mg/dL   Creatinine, Ser 16.14 (H) 0.44 -  1.00 mg/dL   Calcium 9.8 8.9 - 10.3 mg/dL   Total Protein 8.3 (H) 6.5 - 8.1 g/dL   Albumin 3.8 3.5 - 5.0 g/dL   AST 11 (L) 15 - 41 U/L   ALT 10 0 - 44 U/L   Alkaline Phosphatase 80 38 - 126 U/L   Total Bilirubin 0.6 0.3 - 1.2 mg/dL   GFR, Estimated 2 (L) >60 mL/min   Anion gap 24 (H) 5 - 15  Lipase, blood     Status: None   Collection Time: 03/12/22  7:23 PM  Result Value Ref Range   Lipase 42 11 - 51 U/L  CBC with Differential     Status: Abnormal   Collection Time: 03/12/22  7:23 PM  Result Value Ref Range   WBC 13.9 (H) 4.0 - 10.5 K/uL   RBC 4.16 3.87 - 5.11 MIL/uL   Hemoglobin 12.5 12.0 - 15.0 g/dL   HCT 38.3 36.0 - 46.0 %   MCV 92.1 80.0 - 100.0 fL   MCH 30.0 26.0 - 34.0 pg   MCHC 32.6 30.0 - 36.0 g/dL   RDW 13.2 11.5 - 15.5 %   Platelets 383 150 - 400 K/uL   nRBC 0.0 0.0 - 0.2 %   Neutrophils Relative % 88 %   Neutro Abs 12.3 (H) 1.7 - 7.7 K/uL   Lymphocytes Relative 8 %   Lymphs Abs 1.1 0.7 - 4.0 K/uL   Monocytes Relative 3 %   Monocytes Absolute 0.4 0.1 - 1.0 K/uL   Eosinophils Relative 0 %   Eosinophils Absolute 0.0 0.0 - 0.5 K/uL   Basophils Relative 0 %   Basophils Absolute 0.0 0.0 - 0.1 K/uL   Immature Granulocytes 1 %   Abs Immature Granulocytes 0.08 (H) 0.00 - 0.07 K/uL  Troponin I (High Sensitivity)     Status: Abnormal   Collection Time: 03/12/22  7:23 PM  Result  Value Ref Range   Troponin I (High Sensitivity) 63 (H) <18 ng/L   CXR: 1. No active cardiopulmonary disease is radiographically apparent. 2.  Aortic Atherosclerosis (ICD10-I70.0).  CT ab/pelvis w/o 1. New mild tissue stranding is seen about the SMA. Finding is nonspecific, but could be seen in the setting of vasculitis. Correlate with laboratory analysis and recommend further evaluation with abdominal CTA. 2. Aortic Atherosclerosis (ICD10-I70.0).  EKG: sinus tach, not st elevations   Assessment and Plan: Hypertensive urgency     - placed in obs, progressive @ [facility]     - spoke with Family Medicine Residency Service; they will assume care when she [facility] at [facility]; appreciate their assistance     - continue home regimen; add PRN metoprolol; some of this is exacerbated by pain and her missed dialysis session  Hyperkalemia     - lokelma; trend  Abdominal pain SIRS     - CT ab/pelvis as above     - imaging questions vasculitis; she has no previous history of this     - check ANA, ANCA, ESR/CRP, blood cultures, UA     - given SIRS presentation, it is not unreasonable to start abx (rocephin, flagyl) UPDATE:     - spoke with nephro; agree with getting CTA (see their note); no need to wait for dialysis, appreciate assistance; CTA ordered  Elevated troponin     - likely demand ischemia     - no chest pain at this time; EKG is non-ischemic     - monitor  ESRD on HD     -  nephro (Dr. Posey Pronto) onboard, appreciate assistance  Normocytic anemia     - she is at baseline and there is no evidence of bleed; this is likely ACD, trend  DM2     - A1c, glucose checks, SSI, DM diet as tolerated  Hx of CVA HLD     - continue home regimen when confirmed  Advance Care Planning:   Code Status: Prior FULL  Consults: Nephrology (Dr. Posey Pronto); FMTS (to assume care upon arrival to [facility])  Family Communication: None at bedside  Severity of Illness: The appropriate patient status for this patient is  INPATIENT. Inpatient status is judged to be reasonable and necessary in order to provide the required intensity of service to ensure the patient's safety. The patient's presenting symptoms, physical exam findings, and initial radiographic and laboratory data in the context of their chronic comorbidities is felt to place them at high risk for further clinical deterioration. Furthermore, it is not anticipated that the patient will be medically stable for discharge from the hospital within 2 midnights of admission.   * I certify that at the point of admission it is my clinical judgment that the patient will require inpatient hospital care spanning beyond 2 midnights from the point of admission due to high intensity of service, high risk for further deterioration and high frequency of surveillance required.*  Author: Jonnie Finner, DO 03/13/2022 9:56 AM  For on call review [email or web address].

## 2022-03-13 NOTE — Assessment & Plan Note (Addendum)
History of T2DM diet controlled.  CBGs have ranged from 90s to 120s yesterday. - Renal and carb modified diet - CBG checks every 4 hours

## 2022-03-13 NOTE — Progress Notes (Signed)
FMTS Interim Progress Note  S: Reports pain is 9/10. Denies vomiting or BM today.   O: BP (!) 149/63 (BP Location: Right Arm)   Pulse 74   Temp 97.6 F (36.4 C) (Oral)   Resp 15   Ht '5\' 9"'$  (1.753 m)   Wt 97.2 kg   LMP  (LMP Unknown)   SpO2 97%   BMI 31.64 kg/m   Abm: Diffusely tender to palpation. Soft, nondistended. No guarding or rigidity.    A/P: Abm Pain - CT negative for ischemia - Pt getting ready to eat. Monitor if pain is related to eating - K elevated to 6.2. Did not receive Lokelma earlier today, but will get a dose now. Will get HD tonight  Arlyce Dice, MD 03/13/2022, 8:34 PM PGY-31, [place] Service pager [phone]

## 2022-03-13 NOTE — ED Notes (Signed)
[facility] arrived to transport patient to [facility].

## 2022-03-13 NOTE — Consult Note (Signed)
Reason for Consult:Hyperkalemia, continuity of ESRD care Referring Physician: Cherylann Ratel D.O. [facility])  HPI:  61 year old woman with past medical history significant for hypertension, type 2 diabetes mellitus, dyslipidemia, history of CVA back in 2021 with some mild residual left-sided weakness and end-stage renal disease on hemodialysis on a MWF schedule.  She presented to the emergency room with sharp right upper quadrant pain that seemingly spread to her left upper quadrant over the last 3 days.  She reports some preceding nausea and vomiting for the past 2 weeks and states that for the last several weeks she has been having episodes of intradialytic hypotension and "passing out".  She denies any diarrhea, fevers or chills and does not have any cough or sputum production.  She has had some recent increasing headaches for which she continues to follow-up closely with neurology who are setting her up for an MRI.  She complains of cold intolerance and generalized fatigue/intermittent lightheadedness and dizziness prior to presentation.  She missed her last dialysis treatment on Monday of this week and labs in the emergency room are significant for hyperkalemia with a potassium of 5.8 and elevated BUN/creatinine.  Abdominal CT scan showed mild tissue stranding about the SMA concerning for vasculitis.  She is awaiting transfer to [facility].  Hemodialysis prescription: [facility], MWF, 180 dialyzer, 4 hours, BFR 400/DFR 500, EDW 96 kg, 2K/2.0 calcium, UF profile #3, LUA AVF, 15 G needles, heparin 4000 unit bolus with 2000 units mid treatment.  [name] 7 mcg IV 3 times weekly, Sensipar 90 mg 3 times weekly,   Past Medical History:  Diagnosis Date   Accelerated hypertension    Aortic atherosclerosis (HCC)    Arthritis of knee    bilateral   Diabetes mellitus    type 2 - no meds   Diabetic gastroparesis (HCC)    Diverticulosis    ESRD (end stage renal disease) ([place])    Fibroid uterus     GERD (gastroesophageal reflux disease)    Hyperlipidemia    Hypertension    Hypertensive emergency 02/05/2019   Hypokalemia    IDA (iron deficiency anemia)    Nausea    Renal disorder    Umbilical hernia    Wears dentures     Past Surgical History:  Procedure Laterality Date   AV FISTULA PLACEMENT Left 02/17/2019   Procedure: ARTERIOVENOUS (AV) FISTULA CREATION LEFT UPPER ARM;  Surgeon: Waynetta Sandy, MD;  Location: [place];  Service: Vascular;  Laterality: Left;   CATARACT EXTRACTION     right eye   CESAREAN SECTION     x2   CHOLECYSTECTOMY     laparoscopic   FISTULA SUPERFICIALIZATION Left 04/06/2019   Procedure: FISTULA SUPERFICIALIZATION LEFT BRACHIOCEPHALIC;  Surgeon: Waynetta Sandy, MD;  Location: [place];  Service: Vascular;  Laterality: Left;  Transposition left arm brachiocephalic fistula.   IR FLUORO GUIDE CV LINE RIGHT  02/09/2019   IR FLUORO GUIDE CV LINE RIGHT  03/05/2019   IR US GUIDE VASC ACCESS RIGHT  02/09/2019   MULTIPLE TOOTH EXTRACTIONS     REDUCTION MAMMAPLASTY Bilateral     Family History  Problem Relation Age of Onset   Hypertension Mother    Diabetes Mother    Cancer Father        lung   Colon cancer Neg Hx    Stomach cancer Neg Hx    Esophageal cancer Neg Hx     Social History:  reports that she quit smoking about 10 years  ago. Her smoking use included cigarettes. She has been exposed to tobacco smoke. She has never used smokeless tobacco. She reports that she does not drink alcohol and does not use drugs.  Allergies:  Allergies  Allergen Reactions   Lisinopril Anaphylaxis and Other (See Comments)    angioedema   Venofer  [Ferric Oxide]     Other reaction(s): Back Pain   Camellia Swelling and Other (See Comments)    Angioedema    Jardiance [Empagliflozin] Swelling and Rash    Medications: I have reviewed the patient's current medications. Scheduled:  amLODipine  10 mg Oral Daily   sodium zirconium cyclosilicate  5 g  Oral BID       Latest Ref Rng & Units 03/13/2022   10:08 AM 03/12/2022    7:23 PM 03/03/2022   11:34 AM  BMP  Glucose 70 - 99 mg/dL 90  128  79   BUN 8 - 23 mg/dL 99  95  55   Creatinine 0.44 - 1.00 mg/dL 16.93  16.14  10.44   Sodium 135 - 145 mmol/L 144  142  139   Potassium 3.5 - 5.1 mmol/L 5.8  5.6  5.1   Chloride 98 - 111 mmol/L 96  95  93   CO2 22 - 32 mmol/L '24  23  28   '$ Calcium 8.9 - 10.3 mg/dL 9.5  9.8  9.7       Latest Ref Rng & Units 03/13/2022   10:08 AM 03/12/2022    7:23 PM 03/03/2022   11:34 AM  CBC  WBC 4.0 - 10.5 K/uL 12.2  13.9  10.9   Hemoglobin 12.0 - 15.0 g/dL 11.4  12.5  10.9   Hematocrit 36.0 - 46.0 % 34.8  38.3  33.8   Platelets 150 - 400 K/uL 332  383  303      CT ABDOMEN PELVIS WO CONTRAST  Result Date: 03/12/2022 CLINICAL DATA:  Acute abdominal pain EXAM: CT ABDOMEN AND PELVIS WITHOUT CONTRAST TECHNIQUE: Multidetector CT imaging of the abdomen and pelvis was performed following the standard protocol without IV contrast. RADIATION DOSE REDUCTION: This exam was performed according to the departmental dose-optimization program which includes automated exposure control, adjustment of the mA and/or kV according to patient size and/or use of iterative reconstruction technique. COMPARISON:  CT abdomen and pelvis dated April 02, 2019 FINDINGS: Lower chest: Mitral annular calcifications.  Small hiatal hernia. Hepatobiliary: No focal liver abnormality is seen. Status post cholecystectomy. No biliary dilatation. Pancreas: Unremarkable. No pancreatic ductal dilatation or surrounding inflammatory changes. Spleen: Normal in size without focal abnormality. Adrenals/Urinary Tract: Bilateral adrenal glands are unremarkable. No hydronephrosis or nephrolithiasis. Nonspecific bilateral symmetric perirenal stranding. Bladder is unremarkable. Stomach/Bowel: Stomach is within normal limits. Appendix appears normal. No evidence of bowel wall thickening, distention, or inflammatory  changes. Vascular/Lymphatic: Aortic atherosclerosis. New mild tissue stranding is seen about the SMA. 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 No enlarged abdominal or pelvic lymph nodes. Reproductive: Enlarged fibroid uterus, many of the fibroids are calcified. Other: Small fat containing ventral abdominal wall hernia. No abdominopelvic ascites. Musculoskeletal: No acute or significant osseous findings. IMPRESSION: 1. New mild tissue stranding is seen about the SMA. Finding is nonspecific, but could be seen in the setting of vasculitis. Correlate with laboratory analysis and recommend further evaluation with abdominal CTA. 2. Aortic Atherosclerosis (ICD10-I70.0). Electronically Signed   By: Yetta Glassman M.D.   On: 03/12/2022 19:33  DG Chest 2 View  Result Date: 03/12/2022 CLINICAL DATA:  Vomiting. Abdominal pain. Chest pain. Epigastric pain. EXAM: CHEST - 2 VIEW COMPARISON:  12/18/2020 FINDINGS: Atherosclerotic calcification of the aortic arch. Heart size within normal limits given the AP frontal projection. No blunting of the costophrenic angles. No pneumothorax or findings of free intraperitoneal gas. IMPRESSION: 1. No active cardiopulmonary disease is radiographically apparent. 2.  Aortic Atherosclerosis (ICD10-I70.0). Electronically Signed   By: Van Clines M.D.   On: 03/12/2022 18:52    Review of Systems  Constitutional:  Positive for appetite change and fatigue. Negative for chills and fever.  HENT: Negative.    Eyes: Negative.   Respiratory: Negative.    Cardiovascular: Negative.   Gastrointestinal:  Positive for abdominal pain, nausea and vomiting.  Endocrine: Positive for cold intolerance.  Genitourinary: Negative.   Musculoskeletal: Negative.   Skin: Negative.   Neurological:  Positive for dizziness, weakness and light-headedness.   Blood pressure (!) 155/65, pulse 85, temperature 98.1 F (36.7  C), temperature source Oral, resp. rate (!) 23, height '5\' 9"'$  (1.753 m), weight 97.1 kg, SpO2 98 %. Physical Exam Vitals and nursing note reviewed.  Constitutional:      Appearance: She is obese. She is ill-appearing.  HENT:     Head: Normocephalic and atraumatic.     Mouth/Throat:     Mouth: Mucous membranes are moist.     Pharynx: Oropharynx is clear.  Eyes:     Extraocular Movements: Extraocular movements intact.     Pupils: Pupils are equal, round, and reactive to light.  Cardiovascular:     Rate and Rhythm: Normal rate and regular rhythm.     Heart sounds: Normal heart sounds. No murmur heard. Pulmonary:     Effort: Pulmonary effort is normal.     Breath sounds: Normal breath sounds. No wheezing or rales.  Abdominal:     General: Abdomen is flat. Bowel sounds are normal. There is no distension.     Palpations: Abdomen is soft.     Tenderness: There is abdominal tenderness in the right upper quadrant, epigastric area and left upper quadrant.  Musculoskeletal:     Right lower leg: No edema.     Left lower leg: No edema.     Comments: Left upper arm AV fistula with palpable thrill  Skin:    General: Skin is warm and dry.  Neurological:     General: No focal deficit present.     Mental Status: She is alert and oriented to person, place, and time.    Assessment/Plan: 1.  Acute abdominal pain: Concern raised for vasculitis.  Given her past history with CVA and intradialytic hypotension, would also be concerned for SMA stenosis/ischemic pain.  Agree with the plan that CT angiogram will be useful to evaluate this further. 2.  Hyperkalemia: Secondary to missed dialysis treatment, will manage transiently with [facility] and definitively with dialysis. 3.  End-stage renal disease: Usually on a Monday/Wednesday/Friday schedule and unfortunately missed her dialysis Monday.  Will order for hemodialysis today. 4.  Hypertension: Blood pressure elevated at this time, suspect that this might be  in part due to her abdominal pain given preceding history of intradialytic hypotension. 5.  Anemia of chronic disease: Hemoglobin and hematocrit currently at goal, will continue to follow CBC trends. 6.  Secondary hyperparathyroidism: Continue renal diet and phosphorus binders when able to eat.  Resume [name] and Sensipar for PTH control.  Domino Holten K. 03/13/2022, 12:57 PM

## 2022-03-13 NOTE — ED Provider Notes (Signed)
[facility] DEPT Provider Note: Georgena Spurling, MD, FACEP  CSN: [id number] MRN: [id number] ARRIVAL: 03/12/22 at [place]: [place]  Abdominal Pain   HISTORY OF PRESENT ILLNESS  03/13/22 2:02 AM [name] is a 61 y.o. female who has been having abdominal pain for the past 5 days associated with nausea and vomiting.  She has not been able to keep anything down.  The abdominal pain is localized in the epigastric region radiating into her chest.  She rates it as a 10 out of 10.  It is worse with movement or palpation.  She has not had shortness of breath.  She has end-stage renal disease and receives dialysis on Monday Wednesday and Friday but did not go on Monday (2 days ago) because she was too weak.   Past Medical History:  Diagnosis Date   Accelerated hypertension    Aortic atherosclerosis (HCC)    Arthritis of knee    bilateral   Diabetes mellitus    type 2 - no meds   Diabetic gastroparesis ([place])    Diverticulosis    ESRD (end stage renal disease) ([place])    Fibroid uterus    GERD (gastroesophageal reflux disease)    Hyperlipidemia    Hypertension    Hypertensive emergency 02/05/2019   Hypokalemia    IDA (iron deficiency anemia)    Nausea    Renal disorder    Umbilical hernia    Wears dentures     Past Surgical History:  Procedure Laterality Date   AV FISTULA PLACEMENT Left 02/17/2019   Procedure: ARTERIOVENOUS (AV) FISTULA CREATION LEFT UPPER ARM;  Surgeon: Waynetta Sandy, MD;  Location: [place];  Service: Vascular;  Laterality: Left;   CATARACT EXTRACTION     right eye   CESAREAN SECTION     x2   CHOLECYSTECTOMY     laparoscopic   FISTULA SUPERFICIALIZATION Left 04/06/2019   Procedure: FISTULA SUPERFICIALIZATION LEFT BRACHIOCEPHALIC;  Surgeon: Waynetta Sandy, MD;  Location: [place];  Service: Vascular;  Laterality: Left;  Transposition left arm brachiocephalic fistula.   IR FLUORO GUIDE CV LINE RIGHT  02/09/2019   IR  FLUORO GUIDE CV LINE RIGHT  03/05/2019   IR US GUIDE VASC ACCESS RIGHT  02/09/2019   MULTIPLE TOOTH EXTRACTIONS     REDUCTION MAMMAPLASTY Bilateral     Family History  Problem Relation Age of Onset   Hypertension Mother    Diabetes Mother    Cancer Father        lung   Colon cancer Neg Hx    Stomach cancer Neg Hx    Esophageal cancer Neg Hx     Social History   Tobacco Use   Smoking status: Former    Years: 20.00    Types: Cigarettes    Quit date: 10/16/2011    Years since quitting: 10.4    Passive exposure: Past   Smokeless tobacco: Never   Tobacco comments:    1 pack will last a week  Vaping Use   Vaping Use: Never used  Substance Use Topics   Alcohol use: No    Alcohol/week: 0.0 standard drinks of alcohol   Drug use: No    Prior to Admission medications   Medication Sig Start Date End Date Taking? Authorizing Provider  amLODipine (NORVASC) 10 MG tablet Take 10 mg by mouth daily. 05/14/21  Yes [provider]  aspirin 325 MG tablet Take 325 mg by mouth daily.   Yes  [provider]  fluticasone (FLONASE) 50 MCG/ACT nasal spray Place 2 sprays into both nostrils daily. 03/05/22  Yes Arlyce Dice, MD  ondansetron (ZOFRAN ODT) 4 MG disintegrating tablet Take 1-2 tablets (4-8 mg total) by mouth every 8 (eight) hours as needed for nausea or vomiting. 02/08/21  Yes Levin Erp, PA  sucroferric oxyhydroxide (VELPHORO) 500 MG chewable tablet Chew 500 mg by mouth 3 (three) times daily with meals.   Yes [provider]  Accu-Chek Softclix Lancets lancets Use as instructed Patient taking differently: 1 each by Other route See admin instructions. Use as instructed 09/29/18   Diallo, Earna Coder, MD  Blood Glucose Monitoring Suppl (ACCU-CHEK AVIVA PLUS) w/Device KIT Use to check sugar three times a day Patient taking differently: 1 each by Other route in the morning, at noon, and at bedtime. 05/14/17   [name], MD  Darbepoetin Alfa (ARANESP)  200 MCG/0.4ML SOSY injection Inject 0.4 mLs (200 mcg total) into the vein every Wednesday with hemodialysis. Patient not taking: Reported on 03/13/2022 10/06/19   Angiulli, Lavon Paganini, PA-C  glucose blood (ACCU-CHEK AVIVA PLUS) test strip Use as instructed Patient taking differently: 1 each by Other route See admin instructions. Use as instructed 09/29/18   Diallo, Earna Coder, MD  Lancets (ACCU-CHEK SOFT TOUCH) lancets Use to check sugars three times a day Patient taking differently: 1 each by Other route in the morning, at noon, and at bedtime. 09/24/18   Diallo, Earna Coder, MD  metoCLOPramide (REGLAN) 10 MG tablet Take 1 tablet (10 mg total) by mouth 4 (four) times daily -  before meals and at bedtime. Patient not taking: Reported on 05/31/2021 02/08/21   Levin Erp, PA  rosuvastatin (CRESTOR) 5 MG tablet Take 1 tablet (5 mg total) by mouth daily. Patient not taking: Reported on 03/13/2022 03/05/22   Arlyce Dice, MD    Allergies Lisinopril, Venofer  [ferric oxide], Camellia, and Jardiance [empagliflozin]   REVIEW OF SYSTEMS  Negative except as noted here or in the History of Present Illness.   PHYSICAL EXAMINATION  Initial Vital Signs Blood pressure (!) 224/106, pulse (!) 107, temperature 98.4 F (36.9 C), temperature source Oral, resp. rate 18, height _0  (1.753 m), weight 97.1 kg, SpO2 100 %.  Examination General: Well-developed, well-nourished female in no acute distress; appearance consistent with age of record HENT: normocephalic; atraumatic Eyes: Normal appearance Neck: supple Heart: regular rate and rhythm; tachycardia Lungs: clear to auscultation bilaterally Abdomen: soft; nondistended; epigastric tenderness; bowel sounds present Extremities: No deformity; full range of motion; dialysis fistula left antecubital fossa with pulse and thrill Neurologic: Awake, alert and oriented; motor function intact in all extremities and symmetric; no facial droop Skin: Warm and  dry Psychiatric: Normal mood and affect   RESULTS  Summary of this visit's results, reviewed and interpreted by myself:   EKG Interpretation  Date/Time:  Tuesday March 12 2022 17:43:44 EST Ventricular Rate:  108 PR Interval:  146 QRS Duration: 103 QT Interval:  374 QTC Calculation: 502 R Axis:   27 Text Interpretation: Sinus tachycardia Nonspecific repol abnormality, lateral leads Prolonged QT interval Rate is faster Confirmed by Leoma Folds, Jenny Reichmann [phone]) on 03/13/2022 2:03:36 AM       Laboratory Studies: Results for orders placed or performed during the hospital encounter of 03/12/22 (from the past 24 hour(s))  Comprehensive metabolic panel     Status: Abnormal   Collection Time: 03/12/22  7:23 PM  Result Value Ref Range   Sodium 142 135 - 145 mmol/L  Potassium 5.6 (H) 3.5 - 5.1 mmol/L   Chloride 95 (L) 98 - 111 mmol/L   CO2 23 22 - 32 mmol/L   Glucose, Bld 128 (H) 70 - 99 mg/dL   BUN 95 (H) 8 - 23 mg/dL   Creatinine, Ser 16.14 (H) 0.44 - 1.00 mg/dL   Calcium 9.8 8.9 - 10.3 mg/dL   Total Protein 8.3 (H) 6.5 - 8.1 g/dL   Albumin 3.8 3.5 - 5.0 g/dL   AST 11 (L) 15 - 41 U/L   ALT 10 0 - 44 U/L   Alkaline Phosphatase 80 38 - 126 U/L   Total Bilirubin 0.6 0.3 - 1.2 mg/dL   GFR, Estimated 2 (L) >60 mL/min   Anion gap 24 (H) 5 - 15  Lipase, blood     Status: None   Collection Time: 03/12/22  7:23 PM  Result Value Ref Range   Lipase 42 11 - 51 U/L  CBC with Differential     Status: Abnormal   Collection Time: 03/12/22  7:23 PM  Result Value Ref Range   WBC 13.9 (H) 4.0 - 10.5 K/uL   RBC 4.16 3.87 - 5.11 MIL/uL   Hemoglobin 12.5 12.0 - 15.0 g/dL   HCT 38.3 36.0 - 46.0 %   MCV 92.1 80.0 - 100.0 fL   MCH 30.0 26.0 - 34.0 pg   MCHC 32.6 30.0 - 36.0 g/dL   RDW 13.2 11.5 - 15.5 %   Platelets 383 150 - 400 K/uL   nRBC 0.0 0.0 - 0.2 %   Neutrophils Relative % 88 %   Neutro Abs 12.3 (H) 1.7 - 7.7 K/uL   Lymphocytes Relative 8 %   Lymphs Abs 1.1 0.7 - 4.0 K/uL   Monocytes  Relative 3 %   Monocytes Absolute 0.4 0.1 - 1.0 K/uL   Eosinophils Relative 0 %   Eosinophils Absolute 0.0 0.0 - 0.5 K/uL   Basophils Relative 0 %   Basophils Absolute 0.0 0.0 - 0.1 K/uL   Immature Granulocytes 1 %   Abs Immature Granulocytes 0.08 (H) 0.00 - 0.07 K/uL  Troponin I (High Sensitivity)     Status: Abnormal   Collection Time: 03/12/22  7:23 PM  Result Value Ref Range   Troponin I (High Sensitivity) 63 (H) <18 ng/L   Imaging Studies: CT ABDOMEN PELVIS WO CONTRAST  Result Date: 03/12/2022 CLINICAL DATA:  Acute abdominal pain EXAM: CT ABDOMEN AND PELVIS WITHOUT CONTRAST TECHNIQUE: Multidetector CT imaging of the abdomen and pelvis was performed following the standard protocol without IV contrast. RADIATION DOSE REDUCTION: This exam was performed according to the departmental dose-optimization program which includes automated exposure control, adjustment of the mA and/or kV according to patient size and/or use of iterative reconstruction technique. COMPARISON:  CT abdomen and pelvis dated April 02, 2019 FINDINGS: Lower chest: Mitral annular calcifications.  Small hiatal hernia. Hepatobiliary: No focal liver abnormality is seen. Status post cholecystectomy. No biliary dilatation. Pancreas: Unremarkable. No pancreatic ductal dilatation or surrounding inflammatory changes. Spleen: Normal in size without focal abnormality. Adrenals/Urinary Tract: Bilateral adrenal glands are unremarkable. No hydronephrosis or nephrolithiasis. Nonspecific bilateral symmetric perirenal stranding. Bladder is unremarkable. Stomach/Bowel: Stomach is within normal limits. Appendix appears normal. No evidence of bowel wall thickening, distention, or inflammatory changes. Vascular/Lymphatic: Aortic atherosclerosis. New mild tissue stranding is seen about the SMA. 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 No enlarged abdominal or pelvic lymph  nodes. Reproductive: Enlarged fibroid uterus,  many of the fibroids are calcified. Other: Small fat containing ventral abdominal wall hernia. No abdominopelvic ascites. Musculoskeletal: No acute or significant osseous findings. IMPRESSION: 1. New mild tissue stranding is seen about the SMA. Finding is nonspecific, but could be seen in the setting of vasculitis. Correlate with laboratory analysis and recommend further evaluation with abdominal CTA. 2. Aortic Atherosclerosis (ICD10-I70.0). Electronically Signed   By: Yetta Glassman M.D.   On: 03/12/2022 19:33   DG Chest 2 View  Result Date: 03/12/2022 CLINICAL DATA:  Vomiting. Abdominal pain. Chest pain. Epigastric pain. EXAM: CHEST - 2 VIEW COMPARISON:  12/18/2020 FINDINGS: Atherosclerotic calcification of the aortic arch. Heart size within normal limits given the AP frontal projection. No blunting of the costophrenic angles. No pneumothorax or findings of free intraperitoneal gas. IMPRESSION: 1. No active cardiopulmonary disease is radiographically apparent. 2.  Aortic Atherosclerosis (ICD10-I70.0). Electronically Signed   By: Van Clines M.D.   On: 03/12/2022 18:52    ED COURSE and MDM  Nursing notes, initial and subsequent vitals signs, including pulse oximetry, reviewed and interpreted by myself.  Vitals:   03/12/22 1712 03/12/22 1720  BP: (!) 224/106   Pulse: (!) 107   Resp: 18   Temp: 98.4 F (36.9 C)   TempSrc: Oral   SpO2: 100%   Weight:  97.1 kg  Height:  _0  (1.753 m)   Medications  ondansetron (ZOFRAN) injection 4 mg (has no administration in time range)  fentaNYL (SUBLIMAZE) injection 50 mcg (has no administration in time range)  labetalol (NORMODYNE) injection 20 mg (has no administration in time range)  ondansetron (ZOFRAN-ODT) disintegrating tablet 4 mg (4 mg Oral Given 03/12/22 1829)  amLODipine (NORVASC) tablet 10 mg (10 mg Oral Given 03/12/22 1829)   We will have patient admitted for emergent dialysis given her  elevated potassium and significantly elevated creatinine.  She will also need further work-up of this abdominal pain, likely involving a CT angiogram of the abdomen.  I will hold off on a CT angiogram at the present time as she has already gotten a dye load and may benefit from having this dialyzed off before having further CT dye.  Patient was given amlodipine on arrival and her blood pressure is now 145/78.  3:30 AM Dr. Myna Hidalgo to admit to hospitalist service.  Patient will be transferred to [facility] for dialysis.  PROCEDURES  Procedures   ED DIAGNOSES     ICD-10-CM   1. Epigastric pain  R10.13     2. Nausea and vomiting in adult  R11.2     3. Hyperkalemia  E87.5     4. Admission for dialysis [facility])  Z99.2          Volanda Mangine, MD 03/13/22 0330

## 2022-03-13 NOTE — Assessment & Plan Note (Signed)
Patient hypertensive on admission and now normotensive. - Amlodipine 10 mg daily

## 2022-03-13 NOTE — ED Notes (Signed)
Report given to [facility] 

## 2022-03-13 NOTE — Progress Notes (Signed)
Family Medicine Teaching Service paged.

## 2022-03-13 NOTE — Assessment & Plan Note (Addendum)
Potassium trend: 6.2>5.3 after Lokelma '5mg'$ .  EKG within normal limits 11/7. Repeat EKG shows normal Qtc 474. - Trend BMP daily - Dialysis received - Monitor for chest pain and AMS

## 2022-03-13 NOTE — Assessment & Plan Note (Addendum)
Reported to be chronic for over a year.  Most likely due to gastritis or gastroparesis.  [facility] recommended restarting home reglan and protonix. -- Continue tylenol 650 mg Q6H PRN -- Continue MiraLAX daily -- Start Senna daily -- Restart Reglan 10 mg TID -- Start Protonix 40 mg  - PT/OT to treat -- OP f/u with [facility]

## 2022-03-13 NOTE — Assessment & Plan Note (Addendum)
Currently receives dialysis on Monday Wednesday and Friday.  She did not go on Monday because she was so weak.  Potassium now 4.4. Plan for HD today. - Nephrology consulted, appreciate help with management - Daily BMP

## 2022-03-13 NOTE — Plan of Care (Signed)

## 2022-03-13 NOTE — H&P (Addendum)
Hospital Admission History and Physical Service Pager: [phone]  Patient name: [name] Medical record number: [id number] Date of Birth: [birth year] Age: 61 y.o. Gender: female  Primary Care Provider: Lowry Ram, MD Consultants: Nephrology  Code Status: Full code   Preferred Emergency Contact:  Contact Information     Name Relation Home Work Mobile   [place] Daughter [phone]        Chief Complaint: Epigastric Abdominal Pain w/ N/V  Assessment and Plan: [name] is a 61 y.o. female presenting with epigastric abdominal pain with associated nausea and vomiting. Differential for this patient's presentation of this includes vasculitis, SBP, appendicitis.  Unclear etiology at this point but concerning for vasculitis on abdominal CT scan.  Spontaneous bacterial peritonitis also in the differential but less likely as patient is afebrile with stable white count and vital signs.  Appendicitis less likely as CT abdomen pelvis did not show any abnormalities around the appendix.  * Abdominal pain Abdominal pain associated with nausea vomiting s/p dialysis session.  CT abdomen pelvis without contrast showed new mild tissue stranding about the SMA.  Nonspecific finding but could be seen in setting of vasculitis.  Recommended abdominal CTA. - Admit to [facility], attending Dr. Ardelia Mems - To rule out vasculitis ANA, ANCA, CRP, sed rate ordered - Continuous cardiac monitoring EF - Consider starting Rocephin and Flagyl - CTA abdomen pelvis pending - One-time dose of Dilaudid given - We will add on oxycodone 5 to 10 mg q6h for moderate to severe pain - 0.5 mg Dilaudid for breakthrough pain q4h - PT/OT to treat - Fall precautions - Continuous cardiac monitoring - Strict I's and O's  Hyperkalemia Potassium 5.8.  EKG within normal limits 11/7. - Lokelma 5 g twice daily ordered - Repeat EKG - Trend BMP daily - Dialysis planned - Monitor for chest pain and AMS  ESRD needing  dialysis [facility]) Currently receives dialysis on Monday Wednesday and Friday.  She did not go on Monday because she was so weak.  Potassium elevated to 5.8.  EKG within normal limits 11/7. - Nephrology consulted, appreciate help with management - Daily BMP - Continue dialysis medications - Dialysis planned for today  Hypertension Patient continues to be hypertensive but not in the severe range. - Amlodipine 10 mg ordered daily - Upon chart review her pressures seem to be an ongoing issue.  May need to add second agent for BP control.  Type 2 diabetes mellitus without complications ([place]) History of T2DM diet controlled. - Renal and carb modified diet - CBG checks every 4 hours   FEN/GI: Renal diet VTE Prophylaxis: Heparin  Disposition: Med/tele, attending Dr. Ardelia Mems  History of Present Illness:  [name] is a 61 y.o. female presenting with epigastric abdominal pain and nausea vomiting present for 5 days after her last dialysis session.  Her pain was so severe she was unable to attend her Monday dialysis session.  Reports the pain starts in her epigastric area and moves to her lower right quadrant and chest.  She reports her chest pain started when she began vomiting.  She denies shortness of breath or increased work of breathing.  S/p cholecystectomy +10 years ago.  At [facility], she was admitted for abdominal pain and received a CT abdomen pelvis without contrast which showed stranding around the SMA which may be concerning for vasculitis.  They recommended a follow-up CTA abdomen pelvis for further evaluation and recommended immunologic studies.  Attending at [facility] reached out to  FMTS for transfer so patient could receive dialysis for her ESRD.  Review Of Systems: Per HPI with the following additions: Denies diarrhea/constipation, hematochezia, sick contacts  Pertinent Past Medical History: ESRD, history of CVA, type 2 diabetes, hypertension Remainder reviewed in history tab.   Pertinent  Past Surgical History: AV fistula placement, C-section, cholecystectomy, bilateral reduction mammoplasty Remainder reviewed in history tab.   Pertinent Social History: Tobacco use: Former, quit for over one year  Alcohol use: denies  Other Substance use: denies  Lives with son   Pertinent Family History: Mother: Hypertension diabetes, deceased Father: Lung cancer, deceased Remainder reviewed in history tab.   Important Outpatient Medications: Crestor 5 Amlodipine 10 ASA 325 mg Remainder reviewed in medication history.   Objective: BP (!) 155/62 (BP Location: Right Arm)   Pulse 76   Temp 97.9 F (36.6 C) (Oral)   Resp 14   Ht '5\' 9"'$  (1.753 m)   Wt 97.2 kg   LMP  (LMP Unknown)   SpO2 97%   BMI 31.64 kg/m  Exam: General: Chronically ill-appearing, no acute distress Cardiovascular: Regular rate, regular rhythm, no murmurs on exam Respiratory: Clear, no wheezing, no increased work of breathing Gastrointestinal: Bowel sounds present, diffusely tender, pain out of proportion to exam, pain seems to be concentrated in the left lower quadrant to epigastric areas. MSK: Chest wall tenderness reproducible on palpation Neuro: X3, no slurred speech, no facial droop, strength intact and equal bilaterally  Labs:  CBC BMET  Recent Labs  Lab 03/13/22 1008  WBC 12.2*  HGB 11.4*  HCT 34.8*  PLT 332   Recent Labs  Lab 03/13/22 1008  NA 144  K 5.8*  CL 96*  CO2 24  BUN 99*  CREATININE 16.93*  GLUCOSE 90  CALCIUM 9.5     Imaging Studies Performed:  CT abdomen pelvis without contrast: 1. New mild tissue stranding is seen about the SMA. Finding is nonspecific, but could be seen in the setting of vasculitis. Correlate with laboratory analysis and recommend further evaluation with abdominal CTA. 2. Aortic Atherosclerosis  Darci Current, DO 03/13/2022, 4:21 PM PGY-1, [place] Intern pager: [phone], text pages welcome Secure chat group [place]    I was personally present and re-performed the exam and medical decision. I verified the service and findings are accurately documented in the intern's note. Please see attending's attestation.  Alen Bleacher, MD 03/13/2022 5:14 PM

## 2022-03-13 NOTE — Progress Notes (Signed)
Patient has signed consent for hemodialysis.

## 2022-03-14 ENCOUNTER — Telehealth: Payer: Self-pay

## 2022-03-14 DIAGNOSIS — R1013 Epigastric pain: Secondary | ICD-10-CM

## 2022-03-14 LAB — CBC
HCT: 31 % — ABNORMAL LOW (ref 36.0–46.0)
Hemoglobin: 10 g/dL — ABNORMAL LOW (ref 12.0–15.0)
MCH: 29.5 pg (ref 26.0–34.0)
MCHC: 32.3 g/dL (ref 30.0–36.0)
MCV: 91.4 fL (ref 80.0–100.0)
Platelets: 316 10*3/uL (ref 150–400)
RBC: 3.39 MIL/uL — ABNORMAL LOW (ref 3.87–5.11)
RDW: 13.1 % (ref 11.5–15.5)
WBC: 11.3 10*3/uL — ABNORMAL HIGH (ref 4.0–10.5)
nRBC: 0 % (ref 0.0–0.2)

## 2022-03-14 LAB — COMPREHENSIVE METABOLIC PANEL
ALT: 9 U/L (ref 0–44)
AST: 9 U/L — ABNORMAL LOW (ref 15–41)
Albumin: 2.9 g/dL — ABNORMAL LOW (ref 3.5–5.0)
Alkaline Phosphatase: 55 U/L (ref 38–126)
Anion gap: 21 — ABNORMAL HIGH (ref 5–15)
BUN: 113 mg/dL — ABNORMAL HIGH (ref 8–23)
CO2: 22 mmol/L (ref 22–32)
Calcium: 9.1 mg/dL (ref 8.9–10.3)
Chloride: 95 mmol/L — ABNORMAL LOW (ref 98–111)
Creatinine, Ser: 18.37 mg/dL — ABNORMAL HIGH (ref 0.44–1.00)
GFR, Estimated: 2 mL/min — ABNORMAL LOW (ref >60)
Glucose, Bld: 133 mg/dL — ABNORMAL HIGH (ref 70–99)
Potassium: 5.3 mmol/L — ABNORMAL HIGH (ref 3.5–5.1)
Sodium: 138 mmol/L (ref 135–145)
Total Bilirubin: 0.5 mg/dL (ref 0.3–1.2)
Total Protein: 6.7 g/dL (ref 6.5–8.1)

## 2022-03-14 LAB — ANCA PROFILE
Anti-MPO Antibodies: <0.2 units (ref 0.0–0.9)
Anti-PR3 Antibodies: <0.2 units (ref 0.0–0.9)
Atypical P-ANCA titer: <1:20 {titer}
C-ANCA: <1:20 {titer}
P-ANCA: <1:20 {titer}

## 2022-03-14 LAB — GLUCOSE, CAPILLARY
Glucose-Capillary: 95 mg/dL (ref 70–99)
Glucose-Capillary: 75 mg/dL (ref 70–99)
Glucose-Capillary: 92 mg/dL (ref 70–99)
Glucose-Capillary: 109 mg/dL — ABNORMAL HIGH (ref 70–99)
Glucose-Capillary: 123 mg/dL — ABNORMAL HIGH (ref 70–99)

## 2022-03-14 LAB — HEPATITIS B SURFACE ANTIBODY, QUANTITATIVE: Hep B S AB Quant (Post): 6.6 m[IU]/mL — ABNORMAL LOW (ref >9.9)

## 2022-03-14 LAB — ANA W/REFLEX IF POSITIVE: Anti Nuclear Antibody (ANA): NEGATIVE

## 2022-03-14 MED ORDER — HEPARIN SODIUM (PORCINE) 5000 UNIT/ML IJ SOLN
5000.0000 [IU] | Freq: Once | INTRAMUSCULAR | Status: AC
  Administered 2022-03-14: 5000 [IU] via SUBCUTANEOUS

## 2022-03-14 MED ORDER — METOCLOPRAMIDE HCL 5 MG PO TABS
10.0000 mg | ORAL_TABLET | Freq: Three times a day (TID) | ORAL | Status: DC
  Administered 2022-03-14 – 2022-03-16 (×5): 10 mg via ORAL
  Filled 2022-03-14 (×5): qty 2

## 2022-03-14 MED ORDER — SUCROFERRIC OXYHYDROXIDE 500 MG PO CHEW
500.0000 mg | CHEWABLE_TABLET | Freq: Three times a day (TID) | ORAL | Status: DC
  Administered 2022-03-14 – 2022-03-16 (×5): 500 mg via ORAL
  Filled 2022-03-14 (×8): qty 1

## 2022-03-14 MED ORDER — ACETAMINOPHEN 325 MG PO TABS
650.0000 mg | ORAL_TABLET | Freq: Four times a day (QID) | ORAL | Status: DC
  Administered 2022-03-14 – 2022-03-16 (×8): 650 mg via ORAL
  Filled 2022-03-14 (×8): qty 2

## 2022-03-14 MED ORDER — CHLORHEXIDINE GLUCONATE CLOTH 2 % EX PADS
6.0000 | MEDICATED_PAD | Freq: Every day | CUTANEOUS | Status: DC
  Administered 2022-03-14 – 2022-03-15 (×2): 6 via TOPICAL

## 2022-03-14 MED ORDER — POLYETHYLENE GLYCOL 3350 17 G PO PACK
17.0000 g | PACK | Freq: Every day | ORAL | Status: DC
  Administered 2022-03-14 – 2022-03-16 (×3): 17 g via ORAL
  Filled 2022-03-14 (×3): qty 1

## 2022-03-14 MED ORDER — PANTOPRAZOLE SODIUM 40 MG PO TBEC
40.0000 mg | DELAYED_RELEASE_TABLET | Freq: Every day | ORAL | Status: DC
  Administered 2022-03-14 – 2022-03-16 (×3): 40 mg via ORAL
  Filled 2022-03-14 (×3): qty 1

## 2022-03-14 MED ORDER — POLYETHYLENE GLYCOL 3350 17 G PO PACK: 17.0000 g | PACK | Freq: Every day | ORAL | Status: DC | PRN

## 2022-03-14 NOTE — Progress Notes (Signed)
Received patient in bed to unit.  Alert and oriented.  Informed consent signed and in chart.   Treatment initiated: 0119 Treatment completed: 6701  Patient tolerated well.  Transported back to the room  Alert, without acute distress.  Hand-off given to patient's nurse.   Access used: [place] Access issues: none  Total UF removed: 1800 Medication(s) given: none Post HD VS: 99.5, 114/64(77), HR-103, RR-22, SP02-99 Post HD weight: 95.3kg   Machine clotted off with 1h to go. Rinsed pt back  [facility]

## 2022-03-14 NOTE — Assessment & Plan Note (Addendum)
Phosphorus yesterday >30 -Start Velphoro 500 3 times daily -Monitor CMP -HD today

## 2022-03-14 NOTE — Progress Notes (Signed)
  Patient has ongoing abdominal pain which may be more chronic, she follows with [facility] outpatient. Spoke with Vicie Mutters, PA  who will discuss with Dr. Henrene Pastor and place a note with recommendations. She also recommends protonix 40 mg daily and considering trial of reglan 10 mg tid before meals. I also wanted to get her in with outpatient follow up which they will schedule as well. Orders placed, greatly appreciate recommendations of GI team.   Donney Dice, DO 03/14/2022, 2:26 PM PGY-3, [place] Medicine Service pager [phone]

## 2022-03-14 NOTE — Progress Notes (Signed)
Nephrology Follow-Up Consult note   Assessment/Recommendations: [name] is a/an 61 y.o. female with a past medical history significant for ESRD (MWF [place]), admitted for abdominal pain.     1.  Acute abdominal pain: Concern raised for vasculitis.  Given her past history with CVA and intradialytic hypotension, would also be concerned for SMA stenosis/ischemic pain.  W/u and mgmt per primary team 2.  Hyperkalemia: improved with dialysis. 5.3 today. Hd again tomorrow 3.  End-stage renal disease: MWF at [place]. Hd on 03/13/22. Plan for HD tomorrow per schedule 4.  Hypertension: BP at goal. EDW 96kg 5.  Anemia of chronic disease: Hgb near goal at 10. Not on esa outpatient 6.  Secondary hyperparathyroidism: Continue renal diet and phosphorus binders when able to eat.  [name] and Sensipar for PTH control.   Recommendations conveyed to primary service.    [facility] 03/14/2022 9:38 AM  ___________________________________________________________  CC: abdominal pain  Interval History/Subjective: Persistent abdominal pain today.  Mild nausea associated with pain.  Tolerated dialysis yesterday with no issues.  No complaints   Medications:  Current Facility-Administered Medications  Medication Dose Route Frequency Provider Last Rate Last Admin   amLODipine (NORVASC) tablet 10 mg  10 mg Oral Daily Kyle, Tyrone A, DO   10 mg at 03/14/22 0931   aspirin tablet 325 mg  325 mg Oral Daily Kyle, Tyrone A, DO   325 mg at 03/14/22 0932   cefTRIAXone (ROCEPHIN) 2 g in sodium chloride 0.9 % 100 mL IVPB  2 g Intravenous Q24H Kyle, Tyrone A, DO 200 mL/hr at 03/13/22 1818 2 g at 03/13/22 1818   Chlorhexidine Gluconate Cloth 2 % PADS 6 each  6 each Topical Daily Lind Covert, MD   6 each at 03/14/22 0912   cinacalcet (SENSIPAR) tablet 60 mg  60 mg Oral Q supper Elmarie Shiley, MD       Derrill Memo ON 03/15/2022] doxercalciferol ([name]) injection 7 mcg  7 mcg Intravenous  Q M,W,F-HD Elmarie Shiley, MD       fentaNYL (SUBLIMAZE) injection 50 mcg  50 mcg Intravenous Q4H PRN Marylyn Ishihara, Tyrone A, DO   50 mcg at 03/14/22 0822   fluticasone (FLONASE) 50 MCG/ACT nasal spray 2 spray  2 spray Each Nare Daily Marylyn Ishihara, Tyrone A, DO       heparin injection 5,000 Units  5,000 Units Subcutaneous Q8H Kyle, Tyrone A, DO   5,000 Units at 03/14/22 0926   heparin injection 5,000 Units  5,000 Units Subcutaneous Once Coralyn Pear B, MD       insulin aspart (novoLOG) injection 0-6 Units  0-6 Units Subcutaneous TID WC Kyle, Tyrone A, DO       metoprolol tartrate (LOPRESSOR) injection 5 mg  5 mg Intravenous Q6H PRN Marylyn Ishihara, Tyrone A, DO   5 mg at 03/13/22 1149   metroNIDAZOLE (FLAGYL) IVPB 500 mg  500 mg Intravenous Q12H Kyle, Tyrone A, DO 100 mL/hr at 03/14/22 0516 500 mg at 03/14/22 0516   oxyCODONE (Oxy IR/ROXICODONE) immediate release tablet 5 mg  5 mg Oral Q6H PRN Cherylann Ratel A, DO   5 mg at 03/13/22 1149   polyethylene glycol (MIRALAX / GLYCOLAX) packet 17 g  17 g Oral Daily PRN Coralyn Pear B, MD       prochlorperazine (COMPAZINE) injection 10 mg  10 mg Intravenous Q6H PRN Marylyn Ishihara, Tyrone A, DO       sodium zirconium cyclosilicate (LOKELMA) packet 5 g  5 g Oral BID  Cherylann Ratel A, DO   5 g at 03/13/22 2018      Review of Systems: 10 systems reviewed and negative except per interval history/subjective  Physical Exam: Vitals:   03/14/22 0729 03/14/22 0931  BP: 114/60 97/71  Pulse: 90   Resp: 20   Temp: 98.2 F (36.8 C)   SpO2: 96%    No intake/output data recorded.  Intake/Output Summary (Last 24 hours) at 03/14/2022 9562 Last data filed at 03/14/2022 0510 Gross per 24 hour  Intake 240 ml  Output 1800 ml  Net -1560 ml   Constitutional: well-appearing, mild intermittent distress ENMT: ears and nose without scars or lesions, MMM CV: normal rate, no edema Respiratory: Bilateral chest rise, normal work of breathing Gastrointestinal: soft, non-distended, no palpable masses or  hernias Skin: no visible lesions or rashes Psych: alert, judgement/insight appropriate, appropriate mood and affect   Test Results I personally reviewed new and old clinical labs and radiology tests Lab Results  Component Value Date   NA 138 03/14/2022   K 5.3 (H) 03/14/2022   CL 95 (L) 03/14/2022   CO2 22 03/14/2022   BUN 113 (H) 03/14/2022   CREATININE 18.37 (H) 03/14/2022   GFR 77.11 10/26/2015   CALCIUM 9.1 03/14/2022   ALBUMIN 2.9 (L) 03/14/2022   PHOS >30.0 (H) 03/13/2022    CBC Recent Labs  Lab 03/12/22 1923 03/13/22 1008 03/14/22 0033  WBC 13.9* 12.2* 11.3*  NEUTROABS 12.3* 9.7*  --   HGB 12.5 11.4* 10.0*  HCT 38.3 34.8* 31.0*  MCV 92.1 91.6 91.4  PLT 383 332 316

## 2022-03-14 NOTE — Telephone Encounter (Signed)
Pt scheduled to see Dr. Norman Herrlich 05/28/22 at 9:10am.

## 2022-03-14 NOTE — Assessment & Plan Note (Addendum)
Hgb trend: 11.4>10>9.9.  No physical exam findings indicative of an active bleed. -Monitor CBC

## 2022-03-14 NOTE — Progress Notes (Signed)
Received a phone call from family medicine resident, Dr. Larae Grooms, in regards to patient of Dr. Hilarie Fredrickson asking for our opinion on a patient in the hospital with ESRD on dialysis in the hospital for hypotension and metabolic abnormalities from dialysis.. Last seen in the office 02/2021 for her longstanding history of abdominal, she has had a thorough work-up in the past, multiple CTs without findings, 2017 unremarkable EGD with Dr. Hilarie Fredrickson, GES negative however with patient's diabetes and history thought to be more related to transient gastroparesis. CTA AB in the ER negative for ischemia No hematemesis, melena or hematochezia. Suggested getting patient back on her outpatient Reglan and PPI. Will set up a follow-up in the office with myself or Dr. Hilarie Fredrickson outpatient.  If there is any further concerns or changes in patient's disposition during this hospital admission,please place a formal consult with GI.

## 2022-03-14 NOTE — Telephone Encounter (Signed)
-----   Message from Vladimir Crofts, [place] sent at 03/14/2022  3:02 PM EST ----- Regarding: Office follow up.. Patient of Dr. Hilarie Fredrickson, not seen in the hospital but primary team called about the patient.  She was complaining of chronic AB pain while in the hospital which she has a history, labs and CTA normal, has not been seen since 02/2021, can we set up FU with myself or Dr. Hilarie Fredrickson in 2-3 months? Thanks!

## 2022-03-14 NOTE — Hospital Course (Addendum)
[name] is a 61 y.o. female presenting with epigastric abdominal pain with associated nausea and vomiting s/p dialysis session.    Abdominal pain Abdominal pain associated with nausea vomiting s/p dialysis session. Reported to be chronic for over a year. Per chart review is being followed and worked up [place] and concern for possible paresis. Abdominal CTA was negative. No evidence of ischemic bowel with normal lactic acid and stable vitals. Will follow up with [facility].  ESRD needing dialysis [facility]) Currently receives dialysis on Monday Wednesday and Friday.  She did not go on Monday because she was so weak.  Presented with elevated potassium which resolved. EKG within normal limits 11/7. Dialysis received 11/8 and 11/10.  Issues for follow up: Please ensure patient follows up with GI outpatient.

## 2022-03-14 NOTE — Progress Notes (Signed)
Daily Progress Note Intern Pager: [phone]  Patient name: [name] Medical record number: [id number] Date of birth: 1960/07/04 Age: 61 y.o. Gender: female  Primary Care Provider: Lowry Ram, MD Consultants: Nephrology Code Status: Full  Pt Overview and Major Events to Date:  11/8-admitted  Assessment and Plan: [name] is a 61 y.o. female presenting with epigastric abdominal pain with associated nausea and vomiting s/p dialysis session.    * Abdominal pain Abdominal pain associated with nausea vomiting s/p dialysis session. Reported to be chronic for over a year. CTA abdomen pelvis shows SMA is patent without aneurysm or dissection.  --Talk to [facility] -- Start tylenol 650 mg Q6H PRN - ANA, ANCA pending - D/c Rocephin and Flagyl - Discontinue Oxycodone -- Start MiraLAX daily - Continuous cardiac monitoring EF - PT/OT to treat  Hyperphosphatemia Phosphorus yesterday >30 -Monitor CMP  Hyperkalemia Potassium trend: 6.2>5.3 after Lokelma '5mg'$ .  EKG within normal limits 11/7. Repeat EKG shows normal Qtc 474. - Trend BMP daily - Dialysis received - Monitor for chest pain and AMS  ESRD needing dialysis [facility]) Currently receives dialysis on Monday Wednesday and Friday.  She did not go on Monday because she was so weak.  Potassium now 5.3.  EKG within normal limits 11/7.  Dialysis received overnight. - Nephrology consulted, appreciate help with management -- Plan for HD tomorrow - Daily BMP - Continue dialysis medications  Anemia in chronic kidney disease Hgb trend: 11.4>10 -Monitor CBC  Hypertension Patient hypertensive on admission and now normotensive. - Amlodipine 10 mg daily   Type 2 diabetes mellitus without complications ([place]) History of T2DM diet controlled.  CBGs have ranged from 90s to 130s yesterday. - Renal and carb modified diet - CBG checks every 4 hours       FEN/GI: renal diet PPx: heparin Dispo:abdominal  workup  Subjective:  Patient was seen this morning.  She continues to have midline abdominal pain.  She is agreeable to receiving heparin for VTE prophylaxis.  Objective: Temp:  [97.6 F (36.4 C)-99.9 F (37.7 C)] 98.2 F (36.8 C) (11/09 1133) Pulse Rate:  [74-112] 98 (11/09 1133) Resp:  [14-24] 20 (11/09 1133) BP: (92-155)/(57-84) 149/84 (11/09 1133) SpO2:  [95 %-99 %] 98 % (11/09 1133) Weight:  [95.4 kg-97.2 kg] 96.2 kg (11/09 0509)  Physical Exam: General: Pleasant, well-appearing woman in no acute distress Cardiovascular: RRR.  No rubs, gallops, murmurs appreciated. Respiratory: CTAB. Abdomen: Diffuse tenderness, most tender in the midline upper region. Extremities: No lower extremity edema  Laboratory: Most recent CBC Lab Results  Component Value Date   WBC 11.3 (H) 03/14/2022   HGB 10.0 (L) 03/14/2022   HCT 31.0 (L) 03/14/2022   MCV 91.4 03/14/2022   PLT 316 03/14/2022   Most recent BMP    Latest Ref Rng & Units 03/14/2022   12:33 AM  BMP  Glucose 70 - 99 mg/dL 133   BUN 8 - 23 mg/dL 113   Creatinine 0.44 - 1.00 mg/dL 18.37   Sodium 135 - 145 mmol/L 138   Potassium 3.5 - 5.1 mmol/L 5.3   Chloride 98 - 111 mmol/L 95   CO2 22 - 32 mmol/L 22   Calcium 8.9 - 10.3 mg/dL 9.1     Other pertinent labs   Sed rate 108 CRP 4.3   Imaging/Diagnostic Tests: CTA A/P IMPRESSION: Very mild perivascular soft tissue is possible along the proximal SMA, but this is not convincing, and may simply reflect motion degradation. Regardless,  the SMA is patent, without aneurysm or dissection.   Otherwise, no acute vascular findings. 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   Carolyn Morin, MD 03/14/2022, 1:10 PM  PGY-1, [place] Intern pager: [phone], text pages welcome Secure chat group [place]

## 2022-03-14 NOTE — Progress Notes (Signed)
Mobility Specialist Progress Note    03/14/22 1108  Mobility  Activity Ambulated with assistance in hallway  Level of Assistance Minimal assist, patient does 75% or more  Assistive Device Front wheel walker  Distance Ambulated (ft) 80 ft  Activity Response Tolerated well  Mobility Referral Yes  $Mobility charge 1 Mobility   Pre-Mobility: 93 HR, 120/56 (73) BP, 98% SpO2 During Mobility: 119 HR, 97% SpO2 Post-Mobility: 103 HR, 135/82 (97) BP, 97% SpO2  Pt received in bed and agreeable. After first stand, pt c/o lightheadedness and took a seated rest break. No complaints during walk. Returned to chair with call bell in reach.    Hildred Alamin Mobility Specialist  Please Psychologist, sport and exercise or Rehab Office at [phone]

## 2022-03-15 DIAGNOSIS — I4729 Other ventricular tachycardia: Secondary | ICD-10-CM | POA: Insufficient documentation

## 2022-03-15 LAB — COMPREHENSIVE METABOLIC PANEL
ALT: 11 U/L (ref 0–44)
AST: 9 U/L — ABNORMAL LOW (ref 15–41)
Albumin: 3 g/dL — ABNORMAL LOW (ref 3.5–5.0)
Alkaline Phosphatase: 56 U/L (ref 38–126)
Anion gap: 19 — ABNORMAL HIGH (ref 5–15)
BUN: 62 mg/dL — ABNORMAL HIGH (ref 8–23)
CO2: 26 mmol/L (ref 22–32)
Calcium: 8.7 mg/dL — ABNORMAL LOW (ref 8.9–10.3)
Chloride: 93 mmol/L — ABNORMAL LOW (ref 98–111)
Creatinine, Ser: 12.33 mg/dL — ABNORMAL HIGH (ref 0.44–1.00)
GFR, Estimated: 3 mL/min — ABNORMAL LOW (ref >60)
Glucose, Bld: 69 mg/dL — ABNORMAL LOW (ref 70–99)
Potassium: 4.4 mmol/L (ref 3.5–5.1)
Sodium: 138 mmol/L (ref 135–145)
Total Bilirubin: 0.5 mg/dL (ref 0.3–1.2)
Total Protein: 6.9 g/dL (ref 6.5–8.1)

## 2022-03-15 LAB — GLUCOSE, CAPILLARY
Glucose-Capillary: 99 mg/dL (ref 70–99)
Glucose-Capillary: 118 mg/dL — ABNORMAL HIGH (ref 70–99)
Glucose-Capillary: 97 mg/dL (ref 70–99)

## 2022-03-15 LAB — CBC
HCT: 30.2 % — ABNORMAL LOW (ref 36.0–46.0)
Hemoglobin: 9.9 g/dL — ABNORMAL LOW (ref 12.0–15.0)
MCH: 29.9 pg (ref 26.0–34.0)
MCHC: 32.8 g/dL (ref 30.0–36.0)
MCV: 91.2 fL (ref 80.0–100.0)
Platelets: 298 10*3/uL (ref 150–400)
RBC: 3.31 MIL/uL — ABNORMAL LOW (ref 3.87–5.11)
RDW: 13 % (ref 11.5–15.5)
WBC: 10.4 10*3/uL (ref 4.0–10.5)
nRBC: 0 % (ref 0.0–0.2)

## 2022-03-15 MED ORDER — SENNA 8.6 MG PO TABS
1.0000 | ORAL_TABLET | Freq: Every day | ORAL | Status: DC
  Administered 2022-03-15 – 2022-03-16 (×2): 8.6 mg via ORAL
  Filled 2022-03-15 (×2): qty 1

## 2022-03-15 NOTE — Progress Notes (Signed)
9bts NSVT. Pt is alert and no complaints. DR Markus Jarvis was Notified. BP 118/62   Pulse 88   Temp 98.7 F (37.1 C) (Oral)   Resp 20   Ht '5\' 9"'$  (1.753 m)   Wt 96.2 kg   LMP  (LMP Unknown)   SpO2 96%   BMI 31.32 kg/m   Plan of care ongoing.

## 2022-03-15 NOTE — Assessment & Plan Note (Addendum)
Overnight, patient had 9 beats of nonsustained ventricular tachycardia shown on telemetry.  Patient is asymptomatic and vital signs have been stable.  On admission, patient did not have concerns of chest pain or heart palpitation.  On EKG, patient has premature ventricular complexes.  At this time there is low concern as this was a transient finding. - Continuous cardiac monitoring EF

## 2022-03-15 NOTE — Progress Notes (Signed)
Daily Progress Note Intern Pager: [phone]  Patient name: [name] Medical record number: [id number] Date of birth: 1960-06-17 Age: 61 y.o. Gender: female  Primary Care Provider: Lowry Ram, MD Consultants: Nephrology Code Status: Full  Pt Overview and Major Events to Date:  11/8-admitted  Assessment and Plan: [name] is a 61 y.o. female presenting with epigastric abdominal pain with associated nausea and vomiting s/p dialysis session.    * Abdominal pain Reported to be chronic for over a year.  Most likely due to gastritis or gastroparesis.  [facility] recommended restarting home reglan and protonix. -- Continue tylenol 650 mg Q6H PRN -- Continue MiraLAX daily -- Start Senna daily -- Restart Reglan 10 mg TID -- Start Protonix 40 mg  - PT/OT to treat -- OP f/u with [facility]  Paroxysmal ventricular tachycardia ([place]) Overnight, patient had 9 beats of nonsustained ventricular tachycardia shown on telemetry.  Patient is asymptomatic and vital signs have been stable.  On admission, patient did not have concerns of chest pain or heart palpitation.  On EKG, patient has premature ventricular complexes.  At this time there is low concern as this was a transient finding. - Continuous cardiac monitoring EF  Hyperphosphatemia Phosphorus yesterday >30 -Start Velphoro 500 3 times daily -Monitor CMP -HD today  ESRD needing dialysis [facility]) Currently receives dialysis on Monday Wednesday and Friday.  She did not go on Monday because she was so weak.  Potassium now 4.4. Plan for HD today. - Nephrology consulted, appreciate help with management - Daily BMP  Anemia in chronic kidney disease Hgb trend: 11.4>10>9.9.  No physical exam findings indicative of an active bleed. -Monitor CBC  Type 2 diabetes mellitus without complications ([place]) History of T2DM diet controlled.  CBGs have ranged from 90s to 120s yesterday. - Renal and carb modified  diet - CBG checks every 4 hours  Hyperkalemia-resolved as of 03/15/2022 Potassium trend: 6.2>5.3 after Lokelma '5mg'$ .  EKG within normal limits 11/7. Repeat EKG shows normal Qtc 474. - Trend BMP daily - Dialysis received - Monitor for chest pain and AMS  Hypertension-resolved as of 03/15/2022 Patient hypertensive on admission and now normotensive. - Amlodipine 10 mg daily        FEN/GI: renal diet PPx: heparin Dispo: home, HD  Subjective:  Patient seen in bed this AM.  Patient reports continued stomach pain that has been going on for over a year.  She reports diffuse abdominal pain that is worse in the midline abdominal region.  Objective: Temp:  [97.6 F (36.4 C)-98.7 F (37.1 C)] 98.7 F (37.1 C) (11/10 1123) Pulse Rate:  [87-98] 92 (11/10 1123) Resp:  [17-20] 19 (11/10 1123) BP: (103-149)/(51-84) 133/52 (11/10 1123) SpO2:  [95 %-100 %] 97 % (11/10 1123)  Physical Exam: General: Well-appearing, pleasant female.  No acute distress Cardiovascular: RRR, no rubs/gallops/murmurs appreciated Abdomen: Tender on palpation in the upper quadrants of abdomen.  Less tender in the lower quadrants. Extremities: No lower extremity edema  Laboratory: Most recent CBC Lab Results  Component Value Date   WBC 10.4 03/15/2022   HGB 9.9 (L) 03/15/2022   HCT 30.2 (L) 03/15/2022   MCV 91.2 03/15/2022   PLT 298 03/15/2022   Most recent BMP    Latest Ref Rng & Units 03/15/2022   12:20 AM  BMP  Glucose 70 - 99 mg/dL 69   BUN 8 - 23 mg/dL 62   Creatinine 0.44 - 1.00 mg/dL 12.33   Sodium 135 -  145 mmol/L 138   Potassium 3.5 - 5.1 mmol/L 4.4   Chloride 98 - 111 mmol/L 93   CO2 22 - 32 mmol/L 26   Calcium 8.9 - 10.3 mg/dL 8.7     Other pertinent labs   ANA, ANCA negative   Imaging/Diagnostic Tests: CTA A/P IMPRESSION: Very mild perivascular soft tissue is possible along the proximal SMA, but this is not convincing, and may simply reflect motion degradation. Regardless, the  SMA is patent, without aneurysm or dissection.   Otherwise, no acute vascular findings. 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   Alesia Morin, MD 03/15/2022, 11:31 AM  PGY-1, [place] Intern pager: [phone], text pages welcome Secure chat group [place]

## 2022-03-15 NOTE — Progress Notes (Signed)
Mobility Specialist Progress Note    03/15/22 0917  Mobility  Activity Ambulated with assistance in hallway  Level of Assistance Contact guard assist, steadying assist  Assistive Device Front wheel walker  Distance Ambulated (ft) 120 ft  Activity Response Tolerated fair  Mobility Referral Yes  $Mobility charge 1 Mobility   Pre-Mobility: 92 HR, 97% SpO2 During Mobility: 110 HR Post-Mobility: 98 HR, 142/67 (88) BP, 98% SpO2  Pt received in chair and agreeable. C/o being really dizzy. Returned to chair with call bell in reach.    Hildred Alamin Mobility Specialist  Please Psychologist, sport and exercise or Rehab Office at [phone]

## 2022-03-15 NOTE — Discharge Summary (Incomplete)
[facility] Discharge Summary  Patient name: [name] Medical record number: [id number] Date of birth: 03/30/1961 Age: 61 y.o. Gender: female Date of Admission: 03/12/2022  Date of Discharge: 11/10 Admitting Physician: Jonnie Finner, DO  Primary Care Provider: Lowry Ram, MD Consultants: Nephrology  Indication for Hospitalization: abdominal pain, dialysis  Discharge Diagnoses/Problem List:  Principal Problem for Admission: abdominal pain Other Problems addressed during stay:  Principal Problem:   Abdominal pain Active Problems:   Hyperlipidemia, unspecified   Type 2 diabetes mellitus without complications ([place])   Anemia in chronic kidney disease   Cerebellar cerebrovascular accident (CVA) without late effect   ESRD needing dialysis ([place])   SIRS (systemic inflammatory response syndrome) ([place])   Elevated troponin   Nausea and vomiting in adult   Hyperphosphatemia   Paroxysmal ventricular tachycardia [facility])    Brief Hospital Course:  [name] is a 61 y.o. female presenting with epigastric abdominal pain with associated nausea and vomiting s/p dialysis session.    Abdominal pain Abdominal pain associated with nausea vomiting s/p dialysis session. Reported to be chronic for over a year. Per chart review is being followed and worked up [place] and concern for possible paresis. Abdominal CTA was negative. No evidence of ischemic bowel with normal lactic acid and stable vitals. Will follow up with [facility].  ESRD needing dialysis [facility]) Currently receives dialysis on Monday Wednesday and Friday.  She did not go on Monday because she was so weak.  Presented with elevated potassium which resolved. EKG within normal limits 11/7. Dialysis received 11/8 and 11/10.  Issues for follow up: Please ensure patient follows up with GI outpatient.   Disposition: home  Discharge Condition: stable   Discharge Exam:  Vitals:   03/15/22 0735  03/15/22 1123  BP: 137/62 (!) 133/52  Pulse: 87 92  Resp: 20 19  Temp: 98.7 F (37.1 C) 98.7 F (37.1 C)  SpO2: 96% 97%   Physical Exam: General: Well-appearing, pleasant female.  No acute distress Cardiovascular: RRR, no rubs/gallops/murmurs appreciated Abdomen: Tender on palpation in the upper quadrants of abdomen.  Less tender in the lower quadrants. Extremities: No lower extremity edema  Significant Procedures: none  Significant Labs and Imaging:  Recent Labs  Lab 03/14/22 0033 03/15/22 0020  WBC 11.3* 10.4  HGB 10.0* 9.9*  HCT 31.0* 30.2*  PLT 316 298   Recent Labs  Lab 03/13/22 1822 03/14/22 0033 03/15/22 0020  NA 141 138 138  K 6.2* 5.3* 4.4  CL 93* 95* 93*  CO2 21* 22 26  GLUCOSE 136* 133* 69*  BUN 111* 113* 62*  CREATININE 18.09* 18.37* 12.33*  CALCIUM 9.2 9.1 8.7*  PHOS >30.0*  --   --   ALKPHOS  --  55 56  AST  --  9* 9*  ALT  --  9 11  ALBUMIN 3.1* 2.9* 3.0*    CT Angio Abd/Pel w/ and/or w/o  Result Date: 03/13/2022 CLINICAL DATA:  SMA vasculitis EXAM: CTA ABDOMEN AND PELVIS WITHOUT AND WITH CONTRAST TECHNIQUE: Multidetector CT imaging of the abdomen and pelvis was performed using the standard protocol during bolus administration of intravenous contrast. Multiplanar reconstructed images and MIPs were obtained and reviewed to evaluate the vascular anatomy. RADIATION DOSE REDUCTION: This exam was performed according to the departmental dose-optimization program which includes automated exposure control, adjustment of the mA and/or kV according to patient size and/or use of iterative reconstruction technique. CONTRAST:  87m OMNIPAQUE IOHEXOL 350 MG/ML  SOLN COMPARISON:  CT abdomen/pelvis dated 03/12/2022 FINDINGS: VASCULAR Aorta: No evidence abdominal aortic aneurysm or dissection.  Patent. Celiac: Patent.  Atherosclerotic calcifications at the origin. SMA: Patent. Atherosclerotic calcifications. There may be very mild perivascular soft tissue proximal along  the SMA (series 5/image 74), but this is equivocal, particularly given the associated motion degradation on the current and prior study. Renals: Patent bilaterally. Atherosclerotic calcifications, particularly at the origin. 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 IMA: Patent. Inflow: Patent bilaterally.  Atherosclerotic calcifications. Proximal Outflow: Patent bilaterally. Veins: Grossly unremarkable. Review of the MIP images confirms the above findings. NON-VASCULAR Lower chest: Mild scarring/atelectasis at the lung bases. Hepatobiliary: Liver is within normal limits. Status post cholecystectomy. No intrahepatic or extrahepatic ductal dilatation. Pancreas: Within normal limits. Spleen: Within normal limits Adrenals/Urinary Tract: Adrenal glands are within normal limits. 14 mm cyst in the posterior interpolar right kidney (series 5/image 90), measuring simple fluid density, benign (Bosniak I). No follow-up is recommended. Left kidney is within normal limits. Nonspecific perinephric stranding bilaterally, chronic. No hydronephrosis. Bladder is underdistended but unremarkable. Stomach/Bowel: Stomach is within normal limits. No evidence of bowel obstruction. Normal appendix (series 5/image 140). Mild left colonic diverticulosis, without evidence of diverticulitis. Lymphatic: No suspicious abdominopelvic lymphadenopathy. Reproductive: Calcified uterine fibroids. Bilateral ovaries are within normal limits. Other: No abdominopelvic ascites. Small fat containing periumbilical hernia (series 5/image 148). Musculoskeletal: Visualized osseous structures are within normal limits. IMPRESSION: Very mild perivascular soft tissue is possible along the proximal SMA, but this is not convincing, and may simply reflect motion degradation. Regardless, the SMA is patent, without aneurysm or dissection. Otherwise, no acute vascular findings. 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 Additional stable ancillary findings  as above. Electronically Signed   By: Julian Hy M.D.   On: 03/13/2022 20:15   CT ABDOMEN PELVIS WO CONTRAST  Result Date: 03/12/2022 CLINICAL DATA:  Acute abdominal pain EXAM: CT ABDOMEN AND PELVIS WITHOUT CONTRAST TECHNIQUE: Multidetector CT imaging of the abdomen and pelvis was performed following the standard protocol without IV contrast. RADIATION DOSE REDUCTION: This exam was performed according to the departmental dose-optimization program which includes automated exposure control, adjustment of the mA and/or kV according to patient size and/or use of iterative reconstruction technique. COMPARISON:  CT abdomen and pelvis dated April 02, 2019 FINDINGS: Lower chest: Mitral annular calcifications.  Small hiatal hernia. Hepatobiliary: No focal liver abnormality is seen. Status post cholecystectomy. No biliary dilatation. Pancreas: Unremarkable. No pancreatic ductal dilatation or surrounding inflammatory changes. Spleen: Normal in size without focal abnormality. Adrenals/Urinary Tract: Bilateral adrenal glands are unremarkable. No hydronephrosis or nephrolithiasis. Nonspecific bilateral symmetric perirenal stranding. Bladder is unremarkable. Stomach/Bowel: Stomach is within normal limits. Appendix appears normal. No evidence of bowel wall thickening, distention, or inflammatory changes. Vascular/Lymphatic: Aortic atherosclerosis. New mild tissue stranding is seen about the SMA. 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 No enlarged abdominal or pelvic lymph nodes. Reproductive: Enlarged fibroid uterus, many of the fibroids are calcified. Other: Small fat containing ventral abdominal wall hernia. No abdominopelvic ascites. Musculoskeletal: No acute or significant osseous findings. IMPRESSION: 1. New mild tissue stranding is seen about the SMA. Finding is nonspecific, but could be seen in the setting of vasculitis. Correlate  with laboratory analysis and recommend further evaluation with abdominal CTA. 2. Aortic Atherosclerosis (ICD10-I70.0). Electronically Signed   By: Yetta Glassman M.D.   On: 03/12/2022 19:33   DG Chest 2 View  Result Date: 03/12/2022  CLINICAL DATA:  Vomiting. Abdominal pain. Chest pain. Epigastric pain. EXAM: CHEST - 2 VIEW COMPARISON:  12/18/2020 FINDINGS: Atherosclerotic calcification of the aortic arch. Heart size within normal limits given the AP frontal projection. No blunting of the costophrenic angles. No pneumothorax or findings of free intraperitoneal gas. IMPRESSION: 1. No active cardiopulmonary disease is radiographically apparent. 2.  Aortic Atherosclerosis (ICD10-I70.0). Electronically Signed   By: Van Clines M.D.   On: 03/12/2022 18:52     Results/Tests Pending at Time of Discharge: none  Discharge Medications:  Allergies as of 03/15/2022       Reactions   Lisinopril Anaphylaxis, Other (See Comments)   angioedema   Venofer  [ferric Oxide]    Other reaction(s): Back Pain   Camellia Swelling, Other (See Comments)   Angioedema    Jardiance [empagliflozin] Swelling, Rash     Med Rec must be completed prior to using this [facility]***       Discharge Instructions: Please refer to Patient Instructions section of EMR for full details.  Patient was counseled important signs and symptoms that should prompt return to medical care, changes in medications, dietary instructions, activity restrictions, and follow up appointments.   Follow-Up Appointments:  Follow-up Information     Pyrtle, Lajuan Lines, MD. Go on 05/28/2022.   Specialty: Gastroenterology Why: Appointment at 9:10 am. Contact information: [address] [address] [phone]         [facility]. Go on 03/22/2022.   Specialty: Family Medicine Why: Appointment at 9:10 am, please arrive at least 15 minutes prior to your scheduled appointment. Contact information: [address] [id number] [place]                Alesia Morin, MD 03/15/2022, 1:12 PM PGY-1, [place]

## 2022-03-15 NOTE — Procedures (Signed)
HD Note:  Some information was entered later than the data was gathered due to patient care needs. The stated time with the data is accurate.  Received patient in bed to unit.  Alert and oriented.  Informed consent signed and in chart.        [facility]

## 2022-03-15 NOTE — Progress Notes (Signed)
Nephrology Follow-Up Consult note   Assessment/Recommendations: [name] is a/an 61 y.o. female with a past medical history significant for ESRD (MWF [place]), admitted for abdominal pain.     1.  Acute abdominal pain: Concern raised for vasculitis, so far negative.  Given her past history with CVA and intradialytic hypotension, would also be concerned for SMA stenosis/ischemic pain.  Follows with GI outpatient and they made recommendations about resuming home medications 2.  Hyperkalemia: improved with dialysis. 3.  End-stage renal disease: MWF at [place]. Continue schedule 4.  Hypertension: BP at goal. EDW 96kg 5.  Anemia of chronic disease: Hgb near goal at 10. Not on esa outpatient. Ctm  6.  Secondary hyperparathyroidism: Continue renal diet and phosphorus binders when able to eat.  [name] and Sensipar for PTH control.   Recommendations conveyed to primary service.    [facility] 03/15/2022 9:13 AM  ___________________________________________________________  CC: abdominal pain  Interval History/Subjective: Persistent abdominal pain today but feels like it is slightly better.  Feels sore in the middle of her abdomen.  Otherwise no complaints   Medications:  Current Facility-Administered Medications  Medication Dose Route Frequency Provider Last Rate Last Admin   acetaminophen (TYLENOL) tablet 650 mg  650 mg Oral Q6H Jacelyn Grip, MD   650 mg at 03/15/22 0541   amLODipine (NORVASC) tablet 10 mg  10 mg Oral Daily Marylyn Ishihara, Tyrone A, DO   10 mg at 03/14/22 9622   aspirin tablet 325 mg  325 mg Oral Daily Kyle, Tyrone A, DO   325 mg at 03/14/22 0932   Chlorhexidine Gluconate Cloth 2 % PADS 6 each  6 each Topical Daily Lind Covert, MD   6 each at 03/14/22 0912   cinacalcet (SENSIPAR) tablet 60 mg  60 mg Oral Q supper Elmarie Shiley, MD   60 mg at 03/14/22 1730   doxercalciferol ([name]) injection 7 mcg  7 mcg Intravenous Q M,W,F-HD Elmarie Shiley,  MD       fluticasone Asencion Islam) 50 MCG/ACT nasal spray 2 spray  2 spray Each Nare Daily Marylyn Ishihara, Tyrone A, DO   2 spray at 03/14/22 1005   heparin injection 5,000 Units  5,000 Units Subcutaneous Q8H Kyle, Tyrone A, DO   5,000 Units at 03/15/22 0540   insulin aspart (novoLOG) injection 0-6 Units  0-6 Units Subcutaneous TID WC Kyle, Tyrone A, DO       metoCLOPramide (REGLAN) tablet 10 mg  10 mg Oral TID AC Ganta, Anupa, DO   10 mg at 03/15/22 0659   metoprolol tartrate (LOPRESSOR) injection 5 mg  5 mg Intravenous Q6H PRN Marylyn Ishihara, Tyrone A, DO   5 mg at 03/13/22 1149   pantoprazole (PROTONIX) EC tablet 40 mg  40 mg Oral Daily Ganta, Anupa, DO   40 mg at 03/14/22 1457   polyethylene glycol (MIRALAX / GLYCOLAX) packet 17 g  17 g Oral Daily Jacelyn Grip, MD   17 g at 03/14/22 1348   prochlorperazine (COMPAZINE) injection 10 mg  10 mg Intravenous Q6H PRN Marylyn Ishihara, Tyrone A, DO       sucroferric oxyhydroxide (VELPHORO) chewable tablet 500 mg  500 mg Oral TID WC Reesa Chew, MD   500 mg at 03/15/22 0700      Review of Systems: 10 systems reviewed and negative except per interval history/subjective  Physical Exam: Vitals:   03/15/22 0333 03/15/22 0735  BP: (!) 103/58 137/62  Pulse:  87  Resp:  20  Temp:  98.3 F (36.8 C) 98.7 F (37.1 C)  SpO2:  96%   No intake/output data recorded.  Intake/Output Summary (Last 24 hours) at 03/15/2022 0913 Last data filed at 03/15/2022 0700 Gross per 24 hour  Intake 478 ml  Output 0 ml  Net 478 ml   Constitutional: well-appearing, mild intermittent distress ENMT: ears and nose without scars or lesions, MMM CV: normal rate, no edema Respiratory: Bilateral chest rise, normal work of breathing Gastrointestinal: soft, non-distended, no palpable masses or hernias Skin: no visible lesions or rashes Psych: alert, judgement/insight appropriate, appropriate mood and affect   Test Results I personally reviewed new and old clinical labs and radiology tests Lab  Results  Component Value Date   NA 138 03/15/2022   K 4.4 03/15/2022   CL 93 (L) 03/15/2022   CO2 26 03/15/2022   BUN 62 (H) 03/15/2022   CREATININE 12.33 (H) 03/15/2022   GFR 77.11 10/26/2015   CALCIUM 8.7 (L) 03/15/2022   ALBUMIN 3.0 (L) 03/15/2022   PHOS >30.0 (H) 03/13/2022    CBC Recent Labs  Lab 03/12/22 1923 03/13/22 1008 03/14/22 0033 03/15/22 0020  WBC 13.9* 12.2* 11.3* 10.4  NEUTROABS 12.3* 9.7*  --   --   HGB 12.5 11.4* 10.0* 9.9*  HCT 38.3 34.8* 31.0* 30.2*  MCV 92.1 91.6 91.4 91.2  PLT 383 332 316 298

## 2022-03-15 NOTE — Discharge Instructions (Signed)
You were hospitalized at [facility] due to abdominal pain.  We expect this is from gastritis or gastroparesis which improved after pain medications and dialysis  We are so glad you are feeling better.  Be sure to follow-up with your regularly scheduled GI appointments.  Please also be sure to follow-up with our clinic/PCP at your earliest convenience.  Thank you for allowing [place] to be a part of your medical care.  Take care, [facility] family medicine team

## 2022-03-16 LAB — COMPREHENSIVE METABOLIC PANEL
ALT: 10 U/L (ref 0–44)
AST: 13 U/L — ABNORMAL LOW (ref 15–41)
Albumin: 3.2 g/dL — ABNORMAL LOW (ref 3.5–5.0)
Alkaline Phosphatase: 57 U/L (ref 38–126)
Anion gap: 16 — ABNORMAL HIGH (ref 5–15)
BUN: 28 mg/dL — ABNORMAL HIGH (ref 8–23)
CO2: 28 mmol/L (ref 22–32)
Calcium: 9.4 mg/dL (ref 8.9–10.3)
Chloride: 91 mmol/L — ABNORMAL LOW (ref 98–111)
Creatinine, Ser: 7 mg/dL — ABNORMAL HIGH (ref 0.44–1.00)
GFR, Estimated: 6 mL/min — ABNORMAL LOW (ref >60)
Glucose, Bld: 98 mg/dL (ref 70–99)
Potassium: 4.3 mmol/L (ref 3.5–5.1)
Sodium: 135 mmol/L (ref 135–145)
Total Bilirubin: 0.5 mg/dL (ref 0.3–1.2)
Total Protein: 7.4 g/dL (ref 6.5–8.1)

## 2022-03-16 LAB — CBC
HCT: 31.2 % — ABNORMAL LOW (ref 36.0–46.0)
Hemoglobin: 10.6 g/dL — ABNORMAL LOW (ref 12.0–15.0)
MCH: 30.8 pg (ref 26.0–34.0)
MCHC: 34 g/dL (ref 30.0–36.0)
MCV: 90.7 fL (ref 80.0–100.0)
Platelets: 287 10*3/uL (ref 150–400)
RBC: 3.44 MIL/uL — ABNORMAL LOW (ref 3.87–5.11)
RDW: 12.8 % (ref 11.5–15.5)
WBC: 10.1 10*3/uL (ref 4.0–10.5)
nRBC: 0 % (ref 0.0–0.2)

## 2022-03-16 LAB — GLUCOSE, CAPILLARY
Glucose-Capillary: 70 mg/dL (ref 70–99)
Glucose-Capillary: 121 mg/dL — ABNORMAL HIGH (ref 70–99)

## 2022-03-16 MED ORDER — POLYETHYLENE GLYCOL 3350 17 G PO PACK: 17.0000 g | PACK | Freq: Every day | ORAL | 0 refills | Status: AC

## 2022-03-16 MED ORDER — SENNA 8.6 MG PO TABS: 1.0000 | ORAL_TABLET | Freq: Every day | ORAL | 0 refills | Status: DC

## 2022-03-16 MED ORDER — PROCHLORPERAZINE MALEATE 10 MG PO TABS: 10.0000 mg | ORAL_TABLET | Freq: Four times a day (QID) | ORAL | 0 refills | Status: DC | PRN

## 2022-03-16 MED ORDER — PANTOPRAZOLE SODIUM 40 MG PO TBEC: 40.0000 mg | DELAYED_RELEASE_TABLET | Freq: Every day | ORAL | 0 refills | Status: DC

## 2022-03-16 NOTE — Plan of Care (Signed)
  Problem: Education: Goal: Knowledge of General Education information will improve Description: Including pain rating scale, medication(s)/side effects and non-pharmacologic comfort measures Outcome: Progressing   Problem: Health Behavior/Discharge Planning: Goal: Ability to manage health-related needs will improve Outcome: Progressing   Problem: Clinical Measurements: Goal: Ability to maintain clinical measurements within normal limits will improve Outcome: Progressing Goal: Will remain free from infection Outcome: Progressing Goal: Cardiovascular complication will be avoided Outcome: Progressing   Problem: Activity: Goal: Risk for activity intolerance will decrease Outcome: Progressing   Problem: Nutrition: Goal: Adequate nutrition will be maintained Outcome: Progressing   Problem: Elimination: Goal: Will not experience complications related to bowel motility Outcome: Progressing   Problem: Pain Managment: Goal: General experience of comfort will improve Outcome: Progressing

## 2022-03-16 NOTE — Progress Notes (Signed)
Mobility Specialist Progress Note:   03/16/22 1148  Mobility  Activity Dangled on edge of bed  Level of Assistance Independent  Assistive Device None  Activity Response Tolerated well  Mobility Referral Yes  $Mobility charge 1 Mobility   Pt received in bed and agreeable with encouragement. C/o feeling sore in abdomen. Pt declined further mobility d/t upcoming discharge and feeling sore. Pt left sitting EOB with all needs met and call bell in reach.   Andrey Campanile Mobility Specialist Please contact via SecureChat or  Rehab office at [phone]

## 2022-03-16 NOTE — Progress Notes (Signed)
Nephrology Follow-Up Consult note   Assessment/Recommendations: [name] is a/an 61 y.o. female with a past medical history significant for ESRD (MWF [place]), admitted for abdominal pain.     1.  Acute abdominal pain: Concern raised for vasculitis, so far negative.  Given her past history with CVA and intradialytic hypotension, would also be concerned for SMA stenosis/ischemic pain.  GI made medication rec's and they will f/u in OP setting for endoscopy if needed. Pt is for dc today.  2.  Hyperkalemia: improved with dialysis. 3.  End-stage renal disease: MWF at [place]. HD at OP unit on Monday.  4.  Hypertension: BP at goal. EDW 96kg 5.  Anemia of chronic disease: Hgb near goal at 10. Not on esa outpatient. Ctm  6.  Secondary hyperparathyroidism: Continue renal diet and phosphorus binders when able to eat.  [name] and Sensipar for PTH control. 7. Dispo - stable for dc from renal standpoint  [facility] 03/16/2022 1:38 PM  ___________________________________________________________  CC: abdominal pain  Interval History/Subjective: no c/o's, may be going home   Medications:  Current Facility-Administered Medications  Medication Dose Route Frequency Provider Last Rate Last Admin   acetaminophen (TYLENOL) tablet 650 mg  650 mg Oral Q6H Jacelyn Grip, MD   650 mg at 03/16/22 1228   amLODipine (NORVASC) tablet 10 mg  10 mg Oral Daily Marylyn Ishihara, Tyrone A, DO   10 mg at 03/16/22 1026   aspirin tablet 325 mg  325 mg Oral Daily Kyle, Tyrone A, DO   325 mg at 03/16/22 1025   Chlorhexidine Gluconate Cloth 2 % PADS 6 each  6 each Topical Daily Lind Covert, MD   6 each at 03/15/22 1013   cinacalcet (SENSIPAR) tablet 60 mg  60 mg Oral Q supper Elmarie Shiley, MD   60 mg at 03/14/22 1730   doxercalciferol ([name]) injection 7 mcg  7 mcg Intravenous Q M,W,F-HD Elmarie Shiley, MD       fluticasone Asencion Islam) 50 MCG/ACT nasal spray 2 spray  2 spray Each Nare Daily Marylyn Ishihara,  Tyrone A, DO   2 spray at 03/16/22 1025   heparin injection 5,000 Units  5,000 Units Subcutaneous Q8H Kyle, Tyrone A, DO   5,000 Units at 03/16/22 0528   insulin aspart (novoLOG) injection 0-6 Units  0-6 Units Subcutaneous TID WC Kyle, Tyrone A, DO       metoCLOPramide (REGLAN) tablet 10 mg  10 mg Oral TID AC Ganta, Anupa, DO   10 mg at 03/16/22 1228   metoprolol tartrate (LOPRESSOR) injection 5 mg  5 mg Intravenous Q6H PRN Marylyn Ishihara, Tyrone A, DO   5 mg at 03/13/22 1149   pantoprazole (PROTONIX) EC tablet 40 mg  40 mg Oral Daily Ganta, Anupa, DO   40 mg at 03/16/22 1026   polyethylene glycol (MIRALAX / GLYCOLAX) packet 17 g  17 g Oral Daily Jacelyn Grip, MD   17 g at 03/16/22 1025   prochlorperazine (COMPAZINE) injection 10 mg  10 mg Intravenous Q6H PRN Marylyn Ishihara, Tyrone A, DO       senna (SENOKOT) tablet 8.6 mg  1 tablet Oral Daily Darci Current, DO   8.6 mg at 03/16/22 1026   sucroferric oxyhydroxide (VELPHORO) chewable tablet 500 mg  500 mg Oral TID WC Reesa Chew, MD   500 mg at 03/16/22 1229     Physical Exam: Constitutional: well-appearing, mild intermittent distress ENMT: ears and nose without scars or lesions, MMM CV: normal rate, no  edema Respiratory: Bilateral chest rise, normal work of breathing Gastrointestinal: soft, non-distended, no palpable masses or hernias Skin: no visible lesions or rashes Psych: alert, judgement/insight appropriate, appropriate mood and affect

## 2022-03-16 NOTE — TOC Transition Note (Signed)
Transition of Care [facility]) - CM/SW Discharge Note   Patient Details  Name: [name] MRN: [id number] Date of Birth: 08-04-1960  Transition of Care [facility] CM/SW Contact:  Konrad Penta, RN Phone Number: [phone] 03/16/2022, 1:09 PM   Clinical Narrative:   Spoke with [name] at beside prior to dc. [name] reports she has rolling walker from previous hospitalization. Lives with son. Daughter to transport home today. Goes to HD on Mondays, Wednesdays, and Fridays. Denies having any TOC needs.    Final next level of care: Home/Self Care Barriers to Discharge: No Barriers Identified   Patient Goals and CMS Choice Patient states their goals for this hospitalization and ongoing recovery are:: return home      Discharge Placement                       Discharge Plan and Services                                     Social Determinants of Health (SDOH) Interventions     Readmission Risk Interventions     No data to display

## 2022-03-18 ENCOUNTER — Telehealth: Payer: Self-pay

## 2022-03-18 ENCOUNTER — Ambulatory Visit (HOSPITAL_COMMUNITY)
Admission: RE | Admit: 2022-03-18 | Discharge: 2022-03-18 | Disposition: A | Discharge status: Home / Self Care | Payer: 59 | Source: Ambulatory Visit | Attending: Family Medicine | Admitting: Family Medicine

## 2022-03-18 DIAGNOSIS — R42 Dizziness and giddiness: Secondary | ICD-10-CM

## 2022-03-18 LAB — CULTURE, BLOOD (ROUTINE X 2)
Culture: NO GROWTH
Culture: NO GROWTH

## 2022-03-18 NOTE — Patient Outreach (Signed)
  Care Coordination [facility] Note Transition Care Management Unsuccessful Follow-up Telephone Call  Date of discharge and from where:  [facility] 03/12/22-03/16/22  Attempts:  1st Attempt  Reason for unsuccessful TCM follow-up call:  Left voice message  Johnney Killian, RN, BSN, CCM Care Management Coordinator [facility] Phone: [phone]: [phone]

## 2022-03-22 ENCOUNTER — Ambulatory Visit: Payer: Self-pay | Admitting: Student

## 2022-03-22 NOTE — Progress Notes (Deleted)
    SUBJECTIVE:   CHIEF COMPLAINT / HPI:   Hospital Follow-up Recently hospitalized with epigastric pain thought possibly to be due to gastroparesis. Started on Protonix and Reglan. Has outpatient f/u with GI on 1/23.   PERTINENT  PMH / PSH: ***  OBJECTIVE:   LMP  (LMP Unknown)   ***  ASSESSMENT/PLAN:   No problem-specific Assessment & Plan notes found for this encounter.     Pearla Dubonnet, MD [place]

## 2022-05-01 ENCOUNTER — Encounter: Payer: Self-pay | Admitting: *Deleted

## 2022-05-02 ENCOUNTER — Ambulatory Visit
Admission: RE | Admit: 2022-05-02 | Discharge: 2022-05-02 | Disposition: A | Discharge status: Home / Self Care | Payer: 59 | Source: Ambulatory Visit | Attending: Family Medicine | Admitting: Family Medicine

## 2022-05-02 ENCOUNTER — Ambulatory Visit (INDEPENDENT_AMBULATORY_CARE_PROVIDER_SITE_OTHER): Payer: 59 | Admitting: Family Medicine

## 2022-05-02 ENCOUNTER — Encounter: Payer: Self-pay | Admitting: Family Medicine

## 2022-05-02 VITALS — BP 126/78 | HR 89 | Temp 98.3°F | Ht 69.0 in | Wt 212.5 lb

## 2022-05-02 DIAGNOSIS — R059 Cough, unspecified: Secondary | ICD-10-CM | POA: Diagnosis not present

## 2022-05-02 DIAGNOSIS — J111 Influenza due to unidentified influenza virus with other respiratory manifestations: Secondary | ICD-10-CM | POA: Diagnosis not present

## 2022-05-02 DIAGNOSIS — R509 Fever, unspecified: Secondary | ICD-10-CM

## 2022-05-02 LAB — POCT INFLUENZA A/B
Influenza A, POC: NEGATIVE
Influenza B, POC: NEGATIVE

## 2022-05-02 MED ORDER — BENZONATATE 100 MG PO CAPS: 100.0000 mg | ORAL_CAPSULE | Freq: Two times a day (BID) | ORAL | 0 refills | Status: DC | PRN

## 2022-05-02 NOTE — Progress Notes (Signed)
    SUBJECTIVE:   CHIEF COMPLAINT / HPI:  Chief Complaint  Patient presents with   Cough    Since 'sunday    Patient started feeling ill 4 days ago with productive cough, congestion, headache, fatigue, body aches, abdominal discomfort, chest discomfort only when coughing. She had a fever up to 102 F initially until yesterday. Drinking OK but not much appetite. She has tried Muciex and Theraflu without much relief. Denies SOB.  She has not missed any dialysis sessions.  No known sick contacts.  She is requesting flu and COVID testing.  PERTINENT  PMH / PSH: ESRD on MWF HD, obesity, PSVT, CVA, T2DM, diabetic gastroparesis  Patient Care Team: Boyina, Akhila, MD as PCP - General (Family [facility]) [facility], [facility]   OBJECTIVE:   BP 126/78   Pulse 89   Temp 98.3 F (36.8 C) (Oral)   Ht 5\' 9" (1.753 m)   Wt 212 lb 8 oz (96.4 kg)   LMP  (LMP Unknown)   SpO2 98%   BMI 31.38 kg/m   Physical Exam Constitutional:      General: She is not in acute distress.    Appearance: Normal appearance. She is obese.  HENT:     Head: Normocephalic and atraumatic.     Nose: Congestion and rhinorrhea present.     Mouth/Throat:     Mouth: Mucous membranes are moist.     Pharynx: Oropharynx is clear. No oropharyngeal exudate or posterior oropharyngeal erythema.  Eyes:     General: No scleral icterus.       Right eye: No discharge.        Left eye: No discharge.     Extraocular Movements: Extraocular movements intact.  Cardiovascular:     Rate and Rhythm: Normal rate and regular rhythm.     Heart sounds: Normal heart sounds.  Pulmonary:     Effort: Pulmonary effort is normal. No respiratory distress.     Comments: Faint coarse rales appreciated in the bilateral bases Musculoskeletal:     Cervical back: Neck supple.  Lymphadenopathy:     Cervical: No cervical adenopathy.  Neurological:     Mental Status: She is alert.         12'$ /28/2023    1:33 PM  Depression screen PHQ  2/9  Down, Depressed, Hopeless 1  PHQ - 2 Score 1  Altered sleeping 1  Tired, decreased energy 1  PHQ-9 Score 3     {Show previous vital signs (optional):23777}    ASSESSMENT/PLAN:   Influenza-like illness Patient presenting with 5 days of viral prodrome of symptoms.  Suspect this is likely viral etiology, but given fever, productive cough, and adventitious lung sounds will evaluate for pneumonia.  Otherwise we will treat supportively. - supportive care, hydration - continue honey, rx benzonatate prn - CXR - POC flu negative - Covid test pending  Return if symptoms worsen or fail to improve.   Zola Button, MD [place]

## 2022-05-02 NOTE — Patient Instructions (Addendum)
It was nice seeing you today!  You can try Tessalon as needed for cough.  I also recommend honey for cough.  [facility] Address: [address], [place], [place] [address] Phone: [phone]   Stay well, Zola Button, MD [place] [phone]  --  Make sure to check out at the front desk before you leave today.  Please arrive at least 15 minutes prior to your scheduled appointments.  If you had blood work today, I will send you a MyChart message or a letter if results are normal. Otherwise, I will give you a call.  If you had a referral placed, they will call you to set up an appointment. Please give [place] a call if you don't hear back in the next 2 weeks.  If you need additional refills before your next appointment, please call your pharmacy first.

## 2022-05-04 LAB — NOVEL CORONAVIRUS, NAA: SARS-CoV-2, NAA: NOT DETECTED

## 2022-05-08 ENCOUNTER — Telehealth: Payer: Self-pay | Admitting: Family Medicine

## 2022-05-08 MED ORDER — DOXYCYCLINE HYCLATE 100 MG PO TABS: 100.0000 mg | ORAL_TABLET | Freq: Two times a day (BID) | ORAL | 0 refills | Status: AC

## 2022-05-08 NOTE — Telephone Encounter (Signed)
Called patient regarding X-ray results. Patient reports that she is still having a concerning cough. Given the infiltrates on x-ray and patient's lack of improvement, we will send in for antibiotics. - Doxycyline '100mg'$  BID

## 2022-05-28 ENCOUNTER — Ambulatory Visit (INDEPENDENT_AMBULATORY_CARE_PROVIDER_SITE_OTHER): Payer: 59 | Admitting: Internal Medicine

## 2022-05-28 ENCOUNTER — Encounter: Payer: Self-pay | Admitting: Internal Medicine

## 2022-05-28 VITALS — BP 160/102 | HR 97 | Ht 69.0 in | Wt 212.0 lb

## 2022-05-28 DIAGNOSIS — Z1211 Encounter for screening for malignant neoplasm of colon: Secondary | ICD-10-CM

## 2022-05-28 DIAGNOSIS — R1012 Left upper quadrant pain: Secondary | ICD-10-CM | POA: Diagnosis not present

## 2022-05-28 DIAGNOSIS — R112 Nausea with vomiting, unspecified: Secondary | ICD-10-CM

## 2022-05-28 DIAGNOSIS — K5909 Other constipation: Secondary | ICD-10-CM

## 2022-05-28 DIAGNOSIS — R6881 Early satiety: Secondary | ICD-10-CM | POA: Diagnosis not present

## 2022-05-28 MED ORDER — PANTOPRAZOLE SODIUM 40 MG PO TBEC: 40.0000 mg | DELAYED_RELEASE_TABLET | Freq: Every day | ORAL | 3 refills | Status: AC

## 2022-05-28 MED ORDER — METOCLOPRAMIDE HCL 10 MG PO TABS: 10.0000 mg | ORAL_TABLET | Freq: Three times a day (TID) | ORAL | 1 refills | Status: AC

## 2022-05-28 NOTE — Progress Notes (Signed)
Subjective:    Patient ID: [name], female    DOB: [birth year], 62 y.o.   MRN: [id number]  HPI [name] is a 62 year old female with a history of GERD, nausea and vomiting suspicious for intermittent gastroparesis despite previously normal gastric emptying scan, colonic diverticulosis, ESRD on dialysis, diabetes, hypertension, hyperlipidemia who presents for follow-up.  She was last seen in the office on 02/08/2021.  She reports that she has run out of her metoclopramide and pantoprazole.  Her nausea and vomiting have returned and bother her on a near daily basis.  She has a soreness in her upper and left upper abdomen.  Most recently she has been vomiting on a daily basis shortly after eating.  She has early fullness and upper abdominal pressure symptom.  The medications previously, pantoprazole and metoclopramide had worked very well for her and the symptoms had resolved.  Bowel movements have been more regular now that she is using MiraLAX 1-2 doses daily.  No blood in stool or melena.  She is not needing to use senna.  She continues hemodialysis on Monday, Wednesday and Friday.  She has been able to complete her sessions of late.   Review of Systems As per HPI, otherwise negative  Current Medications, Allergies, Past Medical History, Past Surgical History, Family History and Social History were reviewed in [employer] record.    Objective:   Physical Exam BP (!) 160/102   Pulse 97   Ht '5\' 9"'$  (1.753 m)   Wt 212 lb (96.2 kg)   LMP  (LMP Unknown)   BMI 31.31 kg/m  Gen: awake, alert, NAD HEENT: anicteric  CV: RRR, no mrg Pulm: CTA b/l Abd: soft, NT/ND, +BS throughout Ext: no c/c/e, left AV fistula Neuro: nonfocal       Latest Ref Rng & Units 03/16/2022   12:18 AM 03/15/2022   12:20 AM 03/14/2022   12:33 AM  CBC  WBC 4.0 - 10.5 K/uL 10.1  10.4  11.3   Hemoglobin 12.0 - 15.0 g/dL 10.6  9.9  10.0   Hematocrit 36.0 - 46.0 % 31.2  30.2  31.0    Platelets 150 - 400 K/uL 287  298  316    CMP     Component Value Date/Time   NA 135 03/16/2022 0018   NA 145 (H) 11/12/2018 0949   K 4.3 03/16/2022 0018   CL 91 (L) 03/16/2022 0018   CO2 28 03/16/2022 0018   GLUCOSE 98 03/16/2022 0018   BUN 28 (H) 03/16/2022 0018   BUN 34 (H) 11/12/2018 0949   CREATININE 7.00 (H) 03/16/2022 0018   CREATININE 1.19 (H) 09/28/2015 1524   CALCIUM 9.4 03/16/2022 0018   CALCIUM 8.8 02/12/2019 0318   PROT 7.4 03/16/2022 0018   PROT 7.1 08/06/2017 1450   ALBUMIN 3.2 (L) 03/16/2022 0018   ALBUMIN 3.7 08/06/2017 1450   AST 13 (L) 03/16/2022 0018   ALT 10 03/16/2022 0018   ALKPHOS 57 03/16/2022 0018   BILITOT 0.5 03/16/2022 0018   BILITOT 0.2 08/06/2017 1450   GFRNONAA 6 (L) 03/16/2022 0018   GFRNONAA 51 (L) 09/28/2015 1524   GFRAA 4 (L) 10/11/2019 1347   GFRAA 59 (L) 09/28/2015 1524   CTA ABDOMEN AND PELVIS WITHOUT AND WITH CONTRAST   TECHNIQUE: Multidetector CT imaging of the abdomen and pelvis was performed using the standard protocol during bolus administration of intravenous contrast. Multiplanar reconstructed images and MIPs were obtained and reviewed to evaluate the vascular anatomy.  RADIATION DOSE REDUCTION: This exam was performed according to the departmental dose-optimization program which includes automated exposure control, adjustment of the mA and/or kV according to patient size and/or use of iterative reconstruction technique.   CONTRAST:  8m OMNIPAQUE IOHEXOL 350 MG/ML SOLN   COMPARISON:  CT abdomen/pelvis dated 03/12/2022   FINDINGS: VASCULAR   Aorta: No evidence abdominal aortic aneurysm or dissection.  Patent.   Celiac: Patent.  Atherosclerotic calcifications at the origin.   SMA: Patent. Atherosclerotic calcifications. There may be very mild perivascular soft tissue proximal along the SMA (series 5/image 74), but this is equivocal, particularly given the associated motion degradation on the current and prior  study.   Renals: Patent bilaterally. Atherosclerotic calcifications, particularly at the origin. 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   IMA: Patent.   Inflow: Patent bilaterally.  Atherosclerotic calcifications.   Proximal Outflow: Patent bilaterally.   Veins: Grossly unremarkable.   Review of the MIP images confirms the above findings.   NON-VASCULAR   Lower chest: Mild scarring/atelectasis at the lung bases.   Hepatobiliary: Liver is within normal limits.   Status post cholecystectomy. No intrahepatic or extrahepatic ductal dilatation.   Pancreas: Within normal limits.   Spleen: Within normal limits   Adrenals/Urinary Tract: Adrenal glands are within normal limits.   14 mm cyst in the posterior interpolar right kidney (series 5/image 90), measuring simple fluid density, benign (Bosniak I). No follow-up is recommended. Left kidney is within normal limits. Nonspecific perinephric stranding bilaterally, chronic. No hydronephrosis.   Bladder is underdistended but unremarkable.   Stomach/Bowel: Stomach is within normal limits.   No evidence of bowel obstruction.   Normal appendix (series 5/image 140).   Mild left colonic diverticulosis, without evidence of diverticulitis.   Lymphatic: No suspicious abdominopelvic lymphadenopathy.   Reproductive: Calcified uterine fibroids.   Bilateral ovaries are within normal limits.   Other: No abdominopelvic ascites.   Small fat containing periumbilical hernia (series 5/image 148).   Musculoskeletal: Visualized osseous structures are within normal limits.   IMPRESSION: Very mild perivascular soft tissue is possible along the proximal SMA, but this is not convincing, and may simply reflect motion degradation. Regardless, the SMA is patent, without aneurysm or dissection.   Otherwise, no acute vascular findings. 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   Additional stable ancillary findings as  above.     Electronically Signed   By: SJulian HyM.D.   On: 03/13/2022 20:15      Assessment & Plan:  62year old female with a history of GERD, nausea and vomiting suspicious for intermittent gastroparesis despite previously normal gastric emptying scan, colonic diverticulosis, ESRD on dialysis, diabetes, hypertension, hyperlipidemia who presents for follow-up.   Nausea, vomiting/early satiety --high suspicion for gastroparesis though more remote gastric emptying scan was normal.  Also may be a component of acid peptic disease.  Mesenteric vasculitis is felt much less likely despite CT scan from 2 and half months ago. --Upper endoscopy in the outpatient hospital setting --Resume pantoprazole 40 mg daily --Resume metoclopramide 5 to 10 mg 3 times daily before meals and at bedtime on an as-needed basis --Gastroparesis type diet  2.  Colon cancer screening --last colonoscopy 10 years ago, repeat colonoscopy recommended for screening.  We discussed the risk, benefits and alternatives to both upper and lower endoscopy and she is agreeable and wishes to proceed --Colonoscopy in the outpatient hospital setting  3.  Chronic constipation -improved with daily MiraLAX --Continue MiraLAX 17 g 1-2 times  daily  30 minutes total spent today including patient facing time, coordination of care, reviewing medical history/procedures/pertinent radiology studies, and documentation of the encounter.

## 2022-05-28 NOTE — Patient Instructions (Signed)
It has been recommended to you by your physician that you have a(n) Colonoscopy/Endoscopy at [facility] completed.  We did not schedule the procedure(s) today due to availability. We will contact you as soon as a hospital case opens up.  We have sent the following medications to your pharmacy for you to pick up at your convenience:  Pantoprazole Reglan  _______________________________________________________  If your blood pressure at your visit was 140/90 or greater, please contact your primary care physician to follow up on this.  _______________________________________________________  If you are age 37 or older, your body mass index should be between 23-30. Your Body mass index is 31.31 kg/m. If this is out of the aforementioned range listed, please consider follow up with your Primary Care Provider.  If you are age 39 or younger, your body mass index should be between 19-25. Your Body mass index is 31.31 kg/m. If this is out of the aformentioned range listed, please consider follow up with your Primary Care Provider.   ________________________________________________________  The [facility] providers would like to encourage you to use [facility] to communicate with providers for non-urgent requests or questions.  Due to long hold times on the telephone, sending your provider a message by [facility] may be a faster and more efficient way to get a response.  Please allow 48 business hours for a response.  Please remember that this is for non-urgent requests.  _______________________________________________________   I appreciate the  opportunity to care for you  Thank You   Ulice Dash Pyrtle,MD

## 2022-05-30 ENCOUNTER — Other Ambulatory Visit: Payer: Self-pay | Admitting: Internal Medicine

## 2022-05-30 MED ORDER — PEG-KCL-NACL-NASULF-NA ASC-C 100 G PO SOLR: 1.0000 | Freq: Once | ORAL | 0 refills | Status: AC

## 2022-05-30 NOTE — Addendum Note (Signed)
Addended by: Larina Bras on: 05/30/2022 03:10 PM   Modules accepted: Orders

## 2022-05-31 ENCOUNTER — Telehealth: Payer: Self-pay | Admitting: Internal Medicine

## 2022-05-31 NOTE — Telephone Encounter (Signed)
[facility] called, stating MoviPrep was not approved, requesting prep medication to be changed. Please advise.

## 2022-05-31 NOTE — Telephone Encounter (Signed)
PA team- Any way we can get a prior auth for Moviprep on this patient? I would just change the prep choice but patient has severe kidney disease/kidney failure and is doing dialysis. Many of the other preps would be contraindicated for kidney failure pt I believe.

## 2022-06-03 ENCOUNTER — Telehealth: Payer: Self-pay | Admitting: *Deleted

## 2022-06-03 NOTE — Telephone Encounter (Signed)
Patient has been scheduled for hospital endoscopy/colonoscopy at [facility] as requested by provider. Appointment is scheduled for 08/08/22 at 730 am, 600 am arrival. I have sent Moviprep to patient's pharmacy (she has kidney failure and this would probably be the best option) and have created instructions for prep.   I have left a message for patient to call back so I can advise of time/date/location of procedure as well as prep.

## 2022-06-04 NOTE — Telephone Encounter (Signed)
Patient returned call and was instructed that she is scheduled for endoscopy/colonoscopy on 08-08-22 at 7:30am with 6 am arrival at [facility].  Patient advised that Moviprep was sent to pharmacy for pick up.  Copy of instructions mailed to the patient per her request.  Patient agreed to plan and verbalized understanding.  No further questions.

## 2022-07-23 ENCOUNTER — Inpatient Hospital Stay (HOSPITAL_COMMUNITY)
Admission: EM | Admit: 2022-07-23 | Discharge: 2022-08-08 | DRG: 871 | Disposition: A | Discharge status: Skilled Nursing Facility | Payer: 59 | Attending: Family Medicine | Admitting: Family Medicine

## 2022-07-23 ENCOUNTER — Other Ambulatory Visit: Payer: Self-pay

## 2022-07-23 ENCOUNTER — Emergency Department (HOSPITAL_COMMUNITY): Payer: 59

## 2022-07-23 DIAGNOSIS — I959 Hypotension, unspecified: Secondary | ICD-10-CM | POA: Diagnosis present

## 2022-07-23 DIAGNOSIS — R222 Localized swelling, mass and lump, trunk: Secondary | ICD-10-CM

## 2022-07-23 DIAGNOSIS — I6340 Cerebral infarction due to embolism of unspecified cerebral artery: Secondary | ICD-10-CM

## 2022-07-23 DIAGNOSIS — K219 Gastro-esophageal reflux disease without esophagitis: Secondary | ICD-10-CM | POA: Diagnosis present

## 2022-07-23 DIAGNOSIS — Z8673 Personal history of transient ischemic attack (TIA), and cerebral infarction without residual deficits: Secondary | ICD-10-CM

## 2022-07-23 DIAGNOSIS — R29708 NIHSS score 8: Secondary | ICD-10-CM | POA: Diagnosis not present

## 2022-07-23 DIAGNOSIS — R0989 Other specified symptoms and signs involving the circulatory and respiratory systems: Secondary | ICD-10-CM | POA: Diagnosis not present

## 2022-07-23 DIAGNOSIS — E1159 Type 2 diabetes mellitus with other circulatory complications: Secondary | ICD-10-CM | POA: Diagnosis present

## 2022-07-23 DIAGNOSIS — R109 Unspecified abdominal pain: Secondary | ICD-10-CM | POA: Diagnosis present

## 2022-07-23 DIAGNOSIS — Z6830 Body mass index (BMI) 30.0-30.9, adult: Secondary | ICD-10-CM

## 2022-07-23 DIAGNOSIS — R627 Adult failure to thrive: Secondary | ICD-10-CM | POA: Diagnosis present

## 2022-07-23 DIAGNOSIS — I252 Old myocardial infarction: Secondary | ICD-10-CM

## 2022-07-23 DIAGNOSIS — E1169 Type 2 diabetes mellitus with other specified complication: Secondary | ICD-10-CM | POA: Diagnosis present

## 2022-07-23 DIAGNOSIS — K5900 Constipation, unspecified: Secondary | ICD-10-CM | POA: Diagnosis not present

## 2022-07-23 DIAGNOSIS — I152 Hypertension secondary to endocrine disorders: Secondary | ICD-10-CM | POA: Diagnosis present

## 2022-07-23 DIAGNOSIS — Z1624 Resistance to multiple antibiotics: Secondary | ICD-10-CM | POA: Diagnosis present

## 2022-07-23 DIAGNOSIS — G8194 Hemiplegia, unspecified affecting left nondominant side: Secondary | ICD-10-CM | POA: Diagnosis not present

## 2022-07-23 DIAGNOSIS — Z992 Dependence on renal dialysis: Secondary | ICD-10-CM

## 2022-07-23 DIAGNOSIS — I700 Atherosclerosis of aorta: Secondary | ICD-10-CM | POA: Diagnosis present

## 2022-07-23 DIAGNOSIS — N186 End stage renal disease: Secondary | ICD-10-CM

## 2022-07-23 DIAGNOSIS — R29810 Facial weakness: Secondary | ICD-10-CM | POA: Diagnosis present

## 2022-07-23 DIAGNOSIS — Z87891 Personal history of nicotine dependence: Secondary | ICD-10-CM

## 2022-07-23 DIAGNOSIS — Z1152 Encounter for screening for COVID-19: Secondary | ICD-10-CM

## 2022-07-23 DIAGNOSIS — D509 Iron deficiency anemia, unspecified: Secondary | ICD-10-CM | POA: Diagnosis present

## 2022-07-23 DIAGNOSIS — R4701 Aphasia: Secondary | ICD-10-CM | POA: Diagnosis not present

## 2022-07-23 DIAGNOSIS — R1310 Dysphagia, unspecified: Secondary | ICD-10-CM | POA: Diagnosis not present

## 2022-07-23 DIAGNOSIS — R479 Unspecified speech disturbances: Secondary | ICD-10-CM

## 2022-07-23 DIAGNOSIS — I639 Cerebral infarction, unspecified: Secondary | ICD-10-CM | POA: Diagnosis not present

## 2022-07-23 DIAGNOSIS — I63443 Cerebral infarction due to embolism of bilateral cerebellar arteries: Secondary | ICD-10-CM | POA: Diagnosis not present

## 2022-07-23 DIAGNOSIS — Z7982 Long term (current) use of aspirin: Secondary | ICD-10-CM

## 2022-07-23 DIAGNOSIS — A419 Sepsis, unspecified organism: Principal | ICD-10-CM | POA: Diagnosis present

## 2022-07-23 DIAGNOSIS — Z79899 Other long term (current) drug therapy: Secondary | ICD-10-CM

## 2022-07-23 DIAGNOSIS — I722 Aneurysm of renal artery: Secondary | ICD-10-CM | POA: Diagnosis present

## 2022-07-23 DIAGNOSIS — I132 Hypertensive heart and chronic kidney disease with heart failure and with stage 5 chronic kidney disease, or end stage renal disease: Secondary | ICD-10-CM | POA: Diagnosis present

## 2022-07-23 DIAGNOSIS — E1122 Type 2 diabetes mellitus with diabetic chronic kidney disease: Secondary | ICD-10-CM | POA: Diagnosis present

## 2022-07-23 DIAGNOSIS — Z6828 Body mass index (BMI) 28.0-28.9, adult: Secondary | ICD-10-CM

## 2022-07-23 DIAGNOSIS — J189 Pneumonia, unspecified organism: Principal | ICD-10-CM | POA: Diagnosis present

## 2022-07-23 DIAGNOSIS — I214 Non-ST elevation (NSTEMI) myocardial infarction: Secondary | ICD-10-CM | POA: Diagnosis not present

## 2022-07-23 DIAGNOSIS — G8929 Other chronic pain: Secondary | ICD-10-CM | POA: Diagnosis present

## 2022-07-23 DIAGNOSIS — I2510 Atherosclerotic heart disease of native coronary artery without angina pectoris: Secondary | ICD-10-CM | POA: Diagnosis present

## 2022-07-23 DIAGNOSIS — Z8249 Family history of ischemic heart disease and other diseases of the circulatory system: Secondary | ICD-10-CM

## 2022-07-23 DIAGNOSIS — I472 Ventricular tachycardia, unspecified: Secondary | ICD-10-CM | POA: Diagnosis present

## 2022-07-23 DIAGNOSIS — A498 Other bacterial infections of unspecified site: Secondary | ICD-10-CM

## 2022-07-23 DIAGNOSIS — M170 Bilateral primary osteoarthritis of knee: Secondary | ICD-10-CM | POA: Diagnosis present

## 2022-07-23 DIAGNOSIS — E785 Hyperlipidemia, unspecified: Secondary | ICD-10-CM | POA: Diagnosis present

## 2022-07-23 DIAGNOSIS — Z833 Family history of diabetes mellitus: Secondary | ICD-10-CM

## 2022-07-23 DIAGNOSIS — E119 Type 2 diabetes mellitus without complications: Secondary | ICD-10-CM

## 2022-07-23 DIAGNOSIS — G4733 Obstructive sleep apnea (adult) (pediatric): Secondary | ICD-10-CM | POA: Diagnosis present

## 2022-07-23 DIAGNOSIS — G9341 Metabolic encephalopathy: Secondary | ICD-10-CM | POA: Diagnosis not present

## 2022-07-23 DIAGNOSIS — M898X9 Other specified disorders of bone, unspecified site: Secondary | ICD-10-CM | POA: Diagnosis present

## 2022-07-23 DIAGNOSIS — Z888 Allergy status to other drugs, medicaments and biological substances status: Secondary | ICD-10-CM

## 2022-07-23 DIAGNOSIS — J188 Other pneumonia, unspecified organism: Secondary | ICD-10-CM | POA: Diagnosis present

## 2022-07-23 DIAGNOSIS — Z801 Family history of malignant neoplasm of trachea, bronchus and lung: Secondary | ICD-10-CM

## 2022-07-23 DIAGNOSIS — E440 Moderate protein-calorie malnutrition: Secondary | ICD-10-CM | POA: Diagnosis present

## 2022-07-23 DIAGNOSIS — N2581 Secondary hyperparathyroidism of renal origin: Secondary | ICD-10-CM | POA: Diagnosis present

## 2022-07-23 DIAGNOSIS — R29898 Other symptoms and signs involving the musculoskeletal system: Secondary | ICD-10-CM

## 2022-07-23 DIAGNOSIS — I5021 Acute systolic (congestive) heart failure: Secondary | ICD-10-CM | POA: Diagnosis present

## 2022-07-23 DIAGNOSIS — I330 Acute and subacute infective endocarditis: Secondary | ICD-10-CM | POA: Diagnosis present

## 2022-07-23 DIAGNOSIS — K3184 Gastroparesis: Secondary | ICD-10-CM | POA: Diagnosis present

## 2022-07-23 DIAGNOSIS — D631 Anemia in chronic kidney disease: Secondary | ICD-10-CM | POA: Diagnosis present

## 2022-07-23 DIAGNOSIS — I76 Septic arterial embolism: Secondary | ICD-10-CM | POA: Diagnosis present

## 2022-07-23 DIAGNOSIS — R296 Repeated falls: Secondary | ICD-10-CM

## 2022-07-23 DIAGNOSIS — E1143 Type 2 diabetes mellitus with diabetic autonomic (poly)neuropathy: Secondary | ICD-10-CM | POA: Diagnosis present

## 2022-07-23 DIAGNOSIS — Z751 Person awaiting admission to adequate facility elsewhere: Secondary | ICD-10-CM

## 2022-07-23 DIAGNOSIS — R4182 Altered mental status, unspecified: Secondary | ICD-10-CM

## 2022-07-23 HISTORY — DX: Cerebral infarction due to thrombosis of left cerebellar artery: I63.342

## 2022-07-23 LAB — CBC WITH DIFFERENTIAL/PLATELET
Abs Immature Granulocytes: 0.06 10*3/uL (ref 0.00–0.07)
Basophils Absolute: 0 10*3/uL (ref 0.0–0.1)
Basophils Relative: 0 %
Eosinophils Absolute: 0 10*3/uL (ref 0.0–0.5)
Eosinophils Relative: 0 %
HCT: 36.9 % (ref 36.0–46.0)
Hemoglobin: 12.1 g/dL (ref 12.0–15.0)
Immature Granulocytes: 1 %
Lymphocytes Relative: 5 %
Lymphs Abs: 0.6 10*3/uL — ABNORMAL LOW (ref 0.7–4.0)
MCH: 28.2 pg (ref 26.0–34.0)
MCHC: 32.8 g/dL (ref 30.0–36.0)
MCV: 86 fL (ref 80.0–100.0)
Monocytes Absolute: 0.6 10*3/uL (ref 0.1–1.0)
Monocytes Relative: 5 %
Neutro Abs: 11.3 10*3/uL — ABNORMAL HIGH (ref 1.7–7.7)
Neutrophils Relative %: 89 %
Platelets: 252 10*3/uL (ref 150–400)
RBC: 4.29 MIL/uL (ref 3.87–5.11)
RDW: 14.4 % (ref 11.5–15.5)
WBC: 12.6 10*3/uL — ABNORMAL HIGH (ref 4.0–10.5)
nRBC: 0 % (ref 0.0–0.2)

## 2022-07-23 LAB — I-STAT CHEM 8, ED
BUN: 57 mg/dL — ABNORMAL HIGH (ref 8–23)
Calcium, Ion: 1.02 mmol/L — ABNORMAL LOW (ref 1.15–1.40)
Chloride: 101 mmol/L (ref 98–111)
Creatinine, Ser: 16.6 mg/dL — ABNORMAL HIGH (ref 0.44–1.00)
Glucose, Bld: 132 mg/dL — ABNORMAL HIGH (ref 70–99)
HCT: 34 % — ABNORMAL LOW (ref 36.0–46.0)
Hemoglobin: 11.6 g/dL — ABNORMAL LOW (ref 12.0–15.0)
Potassium: 4.6 mmol/L (ref 3.5–5.1)
Sodium: 136 mmol/L (ref 135–145)
TCO2: 23 mmol/L (ref 22–32)

## 2022-07-23 LAB — RESP PANEL BY RT-PCR (RSV, FLU A&B, COVID)  RVPGX2
Influenza A by PCR: NEGATIVE
Influenza B by PCR: NEGATIVE
Resp Syncytial Virus by PCR: NEGATIVE
SARS Coronavirus 2 by RT PCR: NEGATIVE

## 2022-07-23 LAB — CBG MONITORING, ED: Glucose-Capillary: 137 mg/dL — ABNORMAL HIGH (ref 70–99)

## 2022-07-23 MED ORDER — BENZONATATE 100 MG PO CAPS
200.0000 mg | ORAL_CAPSULE | Freq: Once | ORAL | Status: AC
  Administered 2022-07-23: 200 mg via ORAL
  Filled 2022-07-23: qty 2

## 2022-07-23 MED ORDER — SODIUM CHLORIDE 0.9 % IV SOLN
1.0000 g | Freq: Once | INTRAVENOUS | Status: AC
  Administered 2022-07-23: 1 g via INTRAVENOUS
  Filled 2022-07-23: qty 10

## 2022-07-23 MED ORDER — SODIUM CHLORIDE 0.9 % IV SOLN
500.0000 mg | Freq: Once | INTRAVENOUS | Status: AC
  Administered 2022-07-23: 500 mg via INTRAVENOUS
  Filled 2022-07-23: qty 5

## 2022-07-23 NOTE — ED Triage Notes (Addendum)
Pt arrived via [facility] from home for fall. Pt suffered mechanical fall around 5am this morning, denies striking head, loc, pain, or injury from fall. Pt was found down by son at 48 and EMS was called. Pt reports weakness, dizziness when standing. Pt reported that she has been sick with a "bad cold" this week, endorses nausea/vomiting, weakness, and cough. PMH dialysis, attended normal treatments this week.   BP 197/97 HR 123 SPO2 93% RA RR 18 CBG 176

## 2022-07-23 NOTE — ED Provider Notes (Signed)
[facility] Provider Note   CSN: [id number] Arrival date & time: 07/23/22  2023     History  Chief Complaint  Patient presents with   Fall   Dizziness   Weakness    [name] is a 62 y.o. female with a past medical history of type 2 diabetes, hypertension and ESRD on MWF dialysis presenting today after a fall.  She tells me that around 5 AM this morning she ambulated to the bathroom and then sustained a fall.  She believes it was mechanical but "she cannot really remember all of the details."  She subsequently spent 12 hours lying on the ground because she was unable to get up on her own.  She lives with her son who ultimately found her on the ground and it took 2 people to lift her up.  She was unable to move her left leg.  Tells me that she does not have any pain to this leg currently.  She does say for the past week she has been nauseous and vomiting.  Also endorses a cough that is occasionally wet for the same time.  Last dialysis yesterday, has not missed any sessions.  No history of seizure, syncope or arrhythmia.  Currently feels baseline.      Fall  Dizziness Associated symptoms: weakness   Weakness Associated symptoms: dizziness        Home Medications Prior to Admission medications   Medication Sig Start Date End Date Taking? Authorizing Provider  Accu-Chek Softclix Lancets lancets Use as instructed Patient taking differently: 1 each by Other route See admin instructions. Use as instructed 09/29/18   Diallo, Earna Coder, MD  amLODipine (NORVASC) 10 MG tablet Take 10 mg by mouth daily. 05/14/21   [provider]  aspirin 325 MG tablet Take 325 mg by mouth daily.    [provider]  benzonatate (TESSALON) 100 MG capsule Take 1 capsule (100 mg total) by mouth 2 (two) times daily as needed for cough. Patient not taking: Reported on 05/28/2022 05/02/22   Zola Button, MD  Blood Glucose Monitoring Suppl  (ACCU-CHEK AVIVA PLUS) w/Device KIT Use to check sugar three times a day Patient taking differently: 1 each by Other route in the morning, at noon, and at bedtime. 05/14/17   [name], MD  Darbepoetin Alfa (ARANESP) 200 MCG/0.4ML SOSY injection Inject 0.4 mLs (200 mcg total) into the vein every Wednesday with hemodialysis. Patient not taking: Reported on 03/13/2022 10/06/19   Angiulli, Lavon Paganini, PA-C  fluticasone [facility]) 50 MCG/ACT nasal spray Place 2 sprays into both nostrils daily. 03/05/22   Arlyce Dice, MD  glucose blood (ACCU-CHEK AVIVA PLUS) test strip Use as instructed Patient taking differently: 1 each by Other route See admin instructions. Use as instructed 09/29/18   Diallo, Earna Coder, MD  Lancets (ACCU-CHEK SOFT TOUCH) lancets Use to check sugars three times a day Patient taking differently: 1 each by Other route in the morning, at noon, and at bedtime. 09/24/18   Diallo, Earna Coder, MD  metoCLOPramide (REGLAN) 10 MG tablet Take 1 tablet (10 mg total) by mouth 4 (four) times daily -  before meals and at bedtime. Take 1/2 to 1 tablet prn 05/28/22   Pyrtle, Lajuan Lines, MD  pantoprazole (PROTONIX) 40 MG tablet Take 1 tablet (40 mg total) by mouth daily. 05/28/22   Pyrtle, Lajuan Lines, MD  polyethylene glycol (MIRALAX / GLYCOLAX) 17 g packet Take 17 g by mouth daily. 03/16/22  Alesia Morin, MD  prochlorperazine (COMPAZINE) 10 MG tablet Take 1 tablet (10 mg total) by mouth every 6 (six) hours as needed for nausea or vomiting. Patient not taking: Reported on 05/28/2022 03/16/22   Alesia Morin, MD  rosuvastatin (CRESTOR) 5 MG tablet Take 1 tablet (5 mg total) by mouth daily. 03/05/22   Arlyce Dice, MD  senna (SENOKOT) 8.6 MG TABS tablet Take 1 tablet (8.6 mg total) by mouth daily. 03/16/22   Alesia Morin, MD  sucroferric oxyhydroxide (VELPHORO) 500 MG chewable tablet Chew 500 mg by mouth 3 (three) times daily with meals. Patient not taking: Reported on 05/28/2022    [provider]      Allergies    Lisinopril, Venofer  [ferric oxide], Camellia, and Jardiance [empagliflozin]    Review of Systems   Review of Systems  Neurological:  Positive for dizziness and weakness.    Physical Exam Updated Vital Signs BP (!) 177/82 (BP Location: Right Arm)   Pulse (!) 121   Temp 98.9 F (37.2 C)   Resp 18   Ht 5\' 9"  (1.753 m)   Wt 97 kg   LMP  (LMP Unknown)   SpO2 90%   BMI 31.58 kg/m  Physical Exam Vitals and nursing note reviewed.  Constitutional:      Appearance: Normal appearance.  HENT:     Head: Normocephalic and atraumatic.     Nose: Nose normal.     Mouth/Throat:     Mouth: Mucous membranes are dry.     Pharynx: Oropharynx is clear.  Eyes:     General: No scleral icterus.    Conjunctiva/sclera: Conjunctivae normal.  Cardiovascular:     Rate and Rhythm: Regular rhythm. Tachycardia present.  Pulmonary:     Effort: Pulmonary effort is normal. No respiratory distress.     Breath sounds: Rhonchi and rales present. No wheezing.  Skin:    General: Skin is warm and dry.     Findings: No rash.  Neurological:     Mental Status: She is alert.  Psychiatric:        Mood and Affect: Mood normal.        Behavior: Behavior normal.     ED Results / Procedures / Treatments   Labs (all labs ordered are listed, but only abnormal results are displayed) Labs Reviewed - No data to display  EKG None  Radiology CT Head Wo Contrast  Result Date: 07/23/2022 CLINICAL DATA:  Mechanical fall at home, head and neck trauma. EXAM: CT HEAD WITHOUT CONTRAST CT CERVICAL SPINE WITHOUT CONTRAST TECHNIQUE: Multidetector CT imaging of the head and cervical spine was performed following the standard protocol without intravenous contrast. Multiplanar CT image reconstructions of the cervical spine were also generated. RADIATION DOSE REDUCTION: This exam was performed according to the departmental dose-optimization program which includes automated exposure control, adjustment  of the mA and/or kV according to patient size and/or use of iterative reconstruction technique. COMPARISON:  MR head examination dated March 18, 2022 FINDINGS: CT HEAD FINDINGS Brain: No evidence of acute infarction, hemorrhage, hydrocephalus, extra-axial collection or mass lesion/mass effect. Diffuse low-attenuation of the periventricular white matter presumed advanced chronic microvascular ischemic changes. Vascular: No hyperdense vessel or unexpected calcification. Skull: Normal. Negative for fracture or focal lesion. Sinuses/Orbits: No acute finding. Other: None. CT CERVICAL SPINE FINDINGS Alignment: Straightening of the cervical spine. Skull base and vertebrae: No acute fracture. No primary bone lesion or focal pathologic process. Soft tissues and spinal canal: No prevertebral  fluid or swelling. No visible canal hematoma. Disc levels: Mild multilevel degenerate disc disease prominent at C5-C6 and C6-C7 with anterior osteophytes. Upper chest: Right lung apex opacity measuring 0.4 x 0.9 cm partially imaged. Other: None IMPRESSION: CT head: 1. No acute intracranial abnormality. 2. Advanced chronic microvascular ischemic changes of the white matter. CT cervical spine: 1. No evidence of cervical spine fracture or traumatic subluxation. 2. 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 3. Right lung apex opacity measuring 0.9 x 0.5 cm partially imaged, which may represent pneumonia. Follow-up examination to resolution is recommended. Electronically Signed   By: Keane Police D.O.   On: 07/23/2022 23:29   CT Cervical Spine Wo Contrast  Result Date: 07/23/2022 CLINICAL DATA:  Mechanical fall at home, head and neck trauma. EXAM: CT HEAD WITHOUT CONTRAST CT CERVICAL SPINE WITHOUT CONTRAST TECHNIQUE: Multidetector CT imaging of the head and cervical spine was performed following the standard protocol without intravenous contrast. Multiplanar CT image reconstructions of the cervical spine were also  generated. RADIATION DOSE REDUCTION: This exam was performed according to the departmental dose-optimization program which includes automated exposure control, adjustment of the mA and/or kV according to patient size and/or use of iterative reconstruction technique. COMPARISON:  MR head examination dated March 18, 2022 FINDINGS: CT HEAD FINDINGS Brain: No evidence of acute infarction, hemorrhage, hydrocephalus, extra-axial collection or mass lesion/mass effect. Diffuse low-attenuation of the periventricular white matter presumed advanced chronic microvascular ischemic changes. Vascular: No hyperdense vessel or unexpected calcification. Skull: Normal. Negative for fracture or focal lesion. Sinuses/Orbits: No acute finding. Other: None. CT CERVICAL SPINE FINDINGS Alignment: Straightening of the cervical spine. Skull base and vertebrae: No acute fracture. No primary bone lesion or focal pathologic process. Soft tissues and spinal canal: No prevertebral fluid or swelling. No visible canal hematoma. Disc levels: Mild multilevel degenerate disc disease prominent at C5-C6 and C6-C7 with anterior osteophytes. Upper chest: Right lung apex opacity measuring 0.4 x 0.9 cm partially imaged. Other: None IMPRESSION: CT head: 1. No acute intracranial abnormality. 2. Advanced chronic microvascular ischemic changes of the white matter. CT cervical spine: 1. No evidence of cervical spine fracture or traumatic subluxation. 2. 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 3. Right lung apex opacity measuring 0.9 x 0.5 cm partially imaged, which may represent pneumonia. Follow-up examination to resolution is recommended. Electronically Signed   By: Keane Police D.O.   On: 07/23/2022 23:29   DG Pelvis 1-2 Views  Result Date: 07/23/2022 CLINICAL DATA:  Shortness of breath EXAM: PELVIS - 1-2 VIEW COMPARISON:  03/13/2022. FINDINGS: There is no evidence of pelvic fracture or diastasis. No pelvic bone lesions are seen.  Calcified uterine fibroids confirmed by prior CT. IMPRESSION: No acute finding. Electronically Signed   By: Jorje Guild M.D.   On: 07/23/2022 22:06   DG Chest 2 View  Result Date: 07/23/2022 CLINICAL DATA:  Shortness of breath EXAM: CHEST - 2 VIEW COMPARISON:  05/02/2022 FINDINGS: Patchy bilateral airspace disease consistent with pneumonia. Mild cardiac enlargement. No visible effusion or pneumothorax. IMPRESSION: Multifocal pneumonia. Electronically Signed   By: Jorje Guild M.D.   On: 07/23/2022 22:06    Procedures Procedures   Medications Ordered in ED Medications  azithromycin (ZITHROMAX) 500 mg in sodium chloride 0.9 % 250 mL IVPB (500 mg Intravenous New Bag/Given 07/23/22 2326)  cefTRIAXone (ROCEPHIN) 1 g in sodium chloride 0.9 % 100 mL IVPB (1 g Intravenous New Bag/Given 07/23/22 2324)  benzonatate (TESSALON) capsule 200 mg (200  mg Oral Given 07/23/22 2327)    ED Course/ Medical Decision Making/ A&P                             Medical Decision Making Amount and/or Complexity of Data Reviewed Labs: ordered. Radiology: ordered.  Risk Prescription drug management.   62 year old female who presented today after fall.  She was found on the ground 12 hours later.  She says that she was unable to get up on her own.  Differential includes but is not limited to hypotension, hypoglycemia, infection, brain mass, seizure, syncope, ACS.  This is not an exhaustive differential.    Past Medical History / Co-morbidities / Social History: HTN, DM, ESRD on dialysis  Physical Exam: Pertinent physical exam findings include Rales and rhonchi throughout  Lab Tests: I ordered, and personally interpreted labs.  The pertinent results include: WBC 12.6 Creatinine 16.6, BUN 16.6, normal potassium (I stat labs)   Imaging Studies: I ordered and independently visualized and interpreted CXR and I agree with the radiologist that there is a clear multifocal PNA  CTH and CT C spine  unremarkable.   Medications: I ordered medication including Tessalon for cough and roceph+azithro for PNA.   Consultations Obtained: Nephrologist, Dr. Augustin Coupe, notified via secure chat for patient to be dialyzed.   MDM/Disposition: This is a 62 year old female who presented today after a fall.  She laid on the ground for 12 hours until her family could help her out.  She is alert and says that she thinks it was mechanical but she has not fully sure.  Workup revealing of a multifocal pneumonia.  Patient did not meet sepsis criteria on arrival however she developed some tachypnea.  Lactic and cultures were ordered in addition to antibiotics for her pneumonia.  Will not start fluid bolus secondary to ESRD on dialysis.  She will require admission for multifocal pneumonia   Final Clinical Impression(s) / ED Diagnoses Final diagnoses:  Multifocal pneumonia    Rx / DC Orders ED Discharge Orders     None      I discussed this case with my attending physician Dr. Sabra Heck who cosigned this note including patient's presenting symptoms, physical exam, and planned diagnostics and interventions. Attending physician stated agreement with plan or made changes to plan which were implemented.      Darliss Ridgel 07/23/22 2357    Noemi Chapel, MD 07/24/22 [phone]

## 2022-07-24 ENCOUNTER — Inpatient Hospital Stay (HOSPITAL_COMMUNITY): Payer: 59

## 2022-07-24 ENCOUNTER — Encounter (HOSPITAL_COMMUNITY): Payer: Self-pay | Admitting: Family Medicine

## 2022-07-24 DIAGNOSIS — R918 Other nonspecific abnormal finding of lung field: Secondary | ICD-10-CM | POA: Diagnosis not present

## 2022-07-24 DIAGNOSIS — I6340 Cerebral infarction due to embolism of unspecified cerebral artery: Secondary | ICD-10-CM | POA: Diagnosis not present

## 2022-07-24 DIAGNOSIS — I330 Acute and subacute infective endocarditis: Secondary | ICD-10-CM | POA: Diagnosis present

## 2022-07-24 DIAGNOSIS — Z992 Dependence on renal dialysis: Secondary | ICD-10-CM | POA: Diagnosis not present

## 2022-07-24 DIAGNOSIS — J189 Pneumonia, unspecified organism: Secondary | ICD-10-CM | POA: Diagnosis present

## 2022-07-24 DIAGNOSIS — G8194 Hemiplegia, unspecified affecting left nondominant side: Secondary | ICD-10-CM | POA: Diagnosis not present

## 2022-07-24 DIAGNOSIS — I722 Aneurysm of renal artery: Secondary | ICD-10-CM | POA: Diagnosis present

## 2022-07-24 DIAGNOSIS — R222 Localized swelling, mass and lump, trunk: Secondary | ICD-10-CM | POA: Diagnosis not present

## 2022-07-24 DIAGNOSIS — I2510 Atherosclerotic heart disease of native coronary artery without angina pectoris: Secondary | ICD-10-CM | POA: Diagnosis not present

## 2022-07-24 DIAGNOSIS — A419 Sepsis, unspecified organism: Secondary | ICD-10-CM | POA: Diagnosis present

## 2022-07-24 DIAGNOSIS — I639 Cerebral infarction, unspecified: Secondary | ICD-10-CM | POA: Diagnosis not present

## 2022-07-24 DIAGNOSIS — R4789 Other speech disturbances: Secondary | ICD-10-CM | POA: Diagnosis not present

## 2022-07-24 DIAGNOSIS — E1143 Type 2 diabetes mellitus with diabetic autonomic (poly)neuropathy: Secondary | ICD-10-CM | POA: Diagnosis present

## 2022-07-24 DIAGNOSIS — I132 Hypertensive heart and chronic kidney disease with heart failure and with stage 5 chronic kidney disease, or end stage renal disease: Secondary | ICD-10-CM | POA: Diagnosis present

## 2022-07-24 DIAGNOSIS — I252 Old myocardial infarction: Secondary | ICD-10-CM | POA: Diagnosis not present

## 2022-07-24 DIAGNOSIS — N2581 Secondary hyperparathyroidism of renal origin: Secondary | ICD-10-CM | POA: Diagnosis present

## 2022-07-24 DIAGNOSIS — I214 Non-ST elevation (NSTEMI) myocardial infarction: Secondary | ICD-10-CM | POA: Diagnosis present

## 2022-07-24 DIAGNOSIS — E1122 Type 2 diabetes mellitus with diabetic chronic kidney disease: Secondary | ICD-10-CM | POA: Diagnosis present

## 2022-07-24 DIAGNOSIS — R7989 Other specified abnormal findings of blood chemistry: Secondary | ICD-10-CM | POA: Diagnosis not present

## 2022-07-24 DIAGNOSIS — R2990 Unspecified symptoms and signs involving the nervous system: Secondary | ICD-10-CM | POA: Diagnosis not present

## 2022-07-24 DIAGNOSIS — N186 End stage renal disease: Secondary | ICD-10-CM | POA: Diagnosis present

## 2022-07-24 DIAGNOSIS — N185 Chronic kidney disease, stage 5: Secondary | ICD-10-CM | POA: Diagnosis not present

## 2022-07-24 DIAGNOSIS — I63443 Cerebral infarction due to embolism of bilateral cerebellar arteries: Secondary | ICD-10-CM | POA: Diagnosis not present

## 2022-07-24 DIAGNOSIS — Z1624 Resistance to multiple antibiotics: Secondary | ICD-10-CM | POA: Diagnosis present

## 2022-07-24 DIAGNOSIS — E1169 Type 2 diabetes mellitus with other specified complication: Secondary | ICD-10-CM | POA: Diagnosis present

## 2022-07-24 DIAGNOSIS — I340 Nonrheumatic mitral (valve) insufficiency: Secondary | ICD-10-CM | POA: Diagnosis not present

## 2022-07-24 DIAGNOSIS — I6310 Cerebral infarction due to embolism of unspecified precerebral artery: Secondary | ICD-10-CM | POA: Diagnosis not present

## 2022-07-24 DIAGNOSIS — I081 Rheumatic disorders of both mitral and tricuspid valves: Secondary | ICD-10-CM | POA: Diagnosis not present

## 2022-07-24 DIAGNOSIS — I059 Rheumatic mitral valve disease, unspecified: Secondary | ICD-10-CM | POA: Diagnosis not present

## 2022-07-24 DIAGNOSIS — R4182 Altered mental status, unspecified: Secondary | ICD-10-CM

## 2022-07-24 DIAGNOSIS — I472 Ventricular tachycardia, unspecified: Secondary | ICD-10-CM | POA: Diagnosis present

## 2022-07-24 DIAGNOSIS — R4701 Aphasia: Secondary | ICD-10-CM | POA: Diagnosis not present

## 2022-07-24 DIAGNOSIS — G9341 Metabolic encephalopathy: Secondary | ICD-10-CM | POA: Diagnosis not present

## 2022-07-24 DIAGNOSIS — D631 Anemia in chronic kidney disease: Secondary | ICD-10-CM | POA: Diagnosis present

## 2022-07-24 DIAGNOSIS — I509 Heart failure, unspecified: Secondary | ICD-10-CM | POA: Diagnosis not present

## 2022-07-24 DIAGNOSIS — R931 Abnormal findings on diagnostic imaging of heart and coronary circulation: Secondary | ICD-10-CM | POA: Diagnosis not present

## 2022-07-24 DIAGNOSIS — R296 Repeated falls: Secondary | ICD-10-CM

## 2022-07-24 DIAGNOSIS — R29898 Other symptoms and signs involving the musculoskeletal system: Secondary | ICD-10-CM | POA: Diagnosis not present

## 2022-07-24 DIAGNOSIS — E440 Moderate protein-calorie malnutrition: Secondary | ICD-10-CM | POA: Diagnosis present

## 2022-07-24 DIAGNOSIS — Z6830 Body mass index (BMI) 30.0-30.9, adult: Secondary | ICD-10-CM | POA: Diagnosis not present

## 2022-07-24 DIAGNOSIS — I5021 Acute systolic (congestive) heart failure: Secondary | ICD-10-CM | POA: Diagnosis present

## 2022-07-24 DIAGNOSIS — R0989 Other specified symptoms and signs involving the circulatory and respiratory systems: Secondary | ICD-10-CM | POA: Diagnosis not present

## 2022-07-24 DIAGNOSIS — I76 Septic arterial embolism: Secondary | ICD-10-CM | POA: Diagnosis present

## 2022-07-24 DIAGNOSIS — Z87891 Personal history of nicotine dependence: Secondary | ICD-10-CM | POA: Diagnosis not present

## 2022-07-24 DIAGNOSIS — Z1152 Encounter for screening for COVID-19: Secondary | ICD-10-CM | POA: Diagnosis not present

## 2022-07-24 HISTORY — DX: Aneurysm of renal artery: I72.2

## 2022-07-24 LAB — HEPARIN LEVEL (UNFRACTIONATED)
Heparin Unfractionated: 0.32 IU/mL (ref 0.30–0.70)
Heparin Unfractionated: 0.34 IU/mL (ref 0.30–0.70)

## 2022-07-24 LAB — COMPREHENSIVE METABOLIC PANEL
ALT: 19 U/L (ref 0–44)
ALT: 17 U/L (ref 0–44)
AST: 44 U/L — ABNORMAL HIGH (ref 15–41)
AST: 48 U/L — ABNORMAL HIGH (ref 15–41)
Albumin: 3.2 g/dL — ABNORMAL LOW (ref 3.5–5.0)
Albumin: 3 g/dL — ABNORMAL LOW (ref 3.5–5.0)
Alkaline Phosphatase: 57 U/L (ref 38–126)
Alkaline Phosphatase: 51 U/L (ref 38–126)
Anion gap: 22 — ABNORMAL HIGH (ref 5–15)
Anion gap: 20 — ABNORMAL HIGH (ref 5–15)
BUN: 64 mg/dL — ABNORMAL HIGH (ref 8–23)
BUN: 64 mg/dL — ABNORMAL HIGH (ref 8–23)
CO2: 21 mmol/L — ABNORMAL LOW (ref 22–32)
CO2: 23 mmol/L (ref 22–32)
Calcium: 9 mg/dL (ref 8.9–10.3)
Calcium: 8.8 mg/dL — ABNORMAL LOW (ref 8.9–10.3)
Chloride: 94 mmol/L — ABNORMAL LOW (ref 98–111)
Chloride: 96 mmol/L — ABNORMAL LOW (ref 98–111)
Creatinine, Ser: 13.99 mg/dL — ABNORMAL HIGH (ref 0.44–1.00)
Creatinine, Ser: 14.02 mg/dL — ABNORMAL HIGH (ref 0.44–1.00)
GFR, Estimated: 3 mL/min — ABNORMAL LOW (ref >60)
GFR, Estimated: 3 mL/min — ABNORMAL LOW (ref >60)
Glucose, Bld: 136 mg/dL — ABNORMAL HIGH (ref 70–99)
Glucose, Bld: 134 mg/dL — ABNORMAL HIGH (ref 70–99)
Potassium: 4.4 mmol/L (ref 3.5–5.1)
Potassium: 4.9 mmol/L (ref 3.5–5.1)
Sodium: 137 mmol/L (ref 135–145)
Sodium: 139 mmol/L (ref 135–145)
Total Bilirubin: 0.8 mg/dL (ref 0.3–1.2)
Total Bilirubin: 0.6 mg/dL (ref 0.3–1.2)
Total Protein: 7.5 g/dL (ref 6.5–8.1)
Total Protein: 7 g/dL (ref 6.5–8.1)

## 2022-07-24 LAB — ECHOCARDIOGRAM COMPLETE
AR max vel: 2.8 cm2
AV Area VTI: 2.92 cm2
AV Area mean vel: 2.81 cm2
AV Mean grad: 2.5 mmHg
AV Peak grad: 4.9 mmHg
Ao pk vel: 1.11 m/s
Area-P 1/2: 10.11 cm2
Height: 70 in
MV VTI: 1.97 cm2
S' Lateral: 3.7 cm
Weight: 3360 oz

## 2022-07-24 LAB — CBC
HCT: 36.1 % (ref 36.0–46.0)
HCT: 32.7 % — ABNORMAL LOW (ref 36.0–46.0)
Hemoglobin: 11.8 g/dL — ABNORMAL LOW (ref 12.0–15.0)
Hemoglobin: 11 g/dL — ABNORMAL LOW (ref 12.0–15.0)
MCH: 28.2 pg (ref 26.0–34.0)
MCH: 28.5 pg (ref 26.0–34.0)
MCHC: 32.7 g/dL (ref 30.0–36.0)
MCHC: 33.6 g/dL (ref 30.0–36.0)
MCV: 86.4 fL (ref 80.0–100.0)
MCV: 84.7 fL (ref 80.0–100.0)
Platelets: 260 10*3/uL (ref 150–400)
Platelets: 246 10*3/uL (ref 150–400)
RBC: 4.18 MIL/uL (ref 3.87–5.11)
RBC: 3.86 MIL/uL — ABNORMAL LOW (ref 3.87–5.11)
RDW: 14.3 % (ref 11.5–15.5)
RDW: 14.7 % (ref 11.5–15.5)
WBC: 11.9 10*3/uL — ABNORMAL HIGH (ref 4.0–10.5)
WBC: 8.9 10*3/uL (ref 4.0–10.5)
nRBC: 0 % (ref 0.0–0.2)
nRBC: 0 % (ref 0.0–0.2)

## 2022-07-24 LAB — TROPONIN I (HIGH SENSITIVITY)
Troponin I (High Sensitivity): 2155 ng/L (ref <18)
Troponin I (High Sensitivity): 1948 ng/L (ref <18)
Troponin I (High Sensitivity): 6242 ng/L (ref <18)
Troponin I (High Sensitivity): 4206 ng/L (ref <18)
Troponin I (High Sensitivity): 4276 ng/L (ref <18)

## 2022-07-24 LAB — CK: Total CK: 1948 U/L — ABNORMAL HIGH (ref 38–234)

## 2022-07-24 LAB — PHOSPHORUS: Phosphorus: 6.8 mg/dL — ABNORMAL HIGH (ref 2.5–4.6)

## 2022-07-24 LAB — LACTIC ACID, PLASMA
Lactic Acid, Venous: 1.8 mmol/L (ref 0.5–1.9)
Lactic Acid, Venous: 2.2 mmol/L (ref 0.5–1.9)

## 2022-07-24 LAB — MAGNESIUM: Magnesium: 2.6 mg/dL — ABNORMAL HIGH (ref 1.7–2.4)

## 2022-07-24 LAB — HEPATITIS B SURFACE ANTIGEN: Hepatitis B Surface Ag: NONREACTIVE

## 2022-07-24 MED ORDER — PANTOPRAZOLE SODIUM 40 MG PO TBEC
40.0000 mg | DELAYED_RELEASE_TABLET | Freq: Every day | ORAL | Status: DC
  Administered 2022-07-24 – 2022-08-08 (×16): 40 mg via ORAL
  Filled 2022-07-24 (×16): qty 1

## 2022-07-24 MED ORDER — ROSUVASTATIN CALCIUM 5 MG PO TABS
5.0000 mg | ORAL_TABLET | Freq: Every day | ORAL | Status: DC
  Filled 2022-07-24: qty 1

## 2022-07-24 MED ORDER — PERFLUTREN LIPID MICROSPHERE
1.0000 mL | INTRAVENOUS | Status: AC | PRN
  Administered 2022-07-24: 2 mL via INTRAVENOUS

## 2022-07-24 MED ORDER — ACETAMINOPHEN 325 MG PO TABS
650.0000 mg | ORAL_TABLET | Freq: Four times a day (QID) | ORAL | Status: DC | PRN
  Administered 2022-07-24 – 2022-08-03 (×8): 650 mg via ORAL
  Filled 2022-07-24 (×10): qty 2

## 2022-07-24 MED ORDER — ASPIRIN 325 MG PO TABS
325.0000 mg | ORAL_TABLET | Freq: Every day | ORAL | Status: DC
  Filled 2022-07-24: qty 1

## 2022-07-24 MED ORDER — CHLORHEXIDINE GLUCONATE CLOTH 2 % EX PADS: 6.0000 | MEDICATED_PAD | Freq: Every day | CUTANEOUS | Status: DC

## 2022-07-24 MED ORDER — VANCOMYCIN HCL IN DEXTROSE 1-5 GM/200ML-% IV SOLN
1000.0000 mg | INTRAVENOUS | Status: DC
  Administered 2022-07-26 – 2022-07-29 (×2): 1000 mg via INTRAVENOUS
  Filled 2022-07-24 (×4): qty 200

## 2022-07-24 MED ORDER — ASPIRIN 81 MG PO TBEC
81.0000 mg | DELAYED_RELEASE_TABLET | Freq: Every day | ORAL | Status: DC
  Administered 2022-07-24 – 2022-07-25 (×2): 81 mg via ORAL
  Filled 2022-07-24 (×2): qty 1

## 2022-07-24 MED ORDER — HEPARIN BOLUS VIA INFUSION
4000.0000 [IU] | Freq: Once | INTRAVENOUS | Status: DC
  Filled 2022-07-24: qty 4000

## 2022-07-24 MED ORDER — SUCROFERRIC OXYHYDROXIDE 500 MG PO CHEW
500.0000 mg | CHEWABLE_TABLET | Freq: Three times a day (TID) | ORAL | Status: DC
  Administered 2022-07-24 – 2022-08-01 (×16): 500 mg via ORAL
  Filled 2022-07-24 (×28): qty 1

## 2022-07-24 MED ORDER — ATORVASTATIN CALCIUM 40 MG PO TABS
40.0000 mg | ORAL_TABLET | Freq: Every day | ORAL | Status: DC
  Administered 2022-07-24 – 2022-08-08 (×16): 40 mg via ORAL
  Filled 2022-07-24 (×16): qty 1

## 2022-07-24 MED ORDER — SODIUM CHLORIDE 0.9 % IV SOLN
500.0000 mg | INTRAVENOUS | Status: DC
  Administered 2022-07-24: 500 mg via INTRAVENOUS
  Filled 2022-07-24 (×2): qty 5

## 2022-07-24 MED ORDER — HEPARIN SODIUM (PORCINE) 5000 UNIT/ML IJ SOLN
4000.0000 [IU] | Freq: Once | INTRAMUSCULAR | Status: AC
  Administered 2022-07-24: 4000 [IU] via INTRAVENOUS
  Filled 2022-07-24: qty 1

## 2022-07-24 MED ORDER — HEPARIN (PORCINE) 25000 UT/250ML-% IV SOLN
1100.0000 [IU]/h | INTRAVENOUS | Status: DC
  Administered 2022-07-24 – 2022-07-25 (×2): 1000 [IU]/h via INTRAVENOUS
  Filled 2022-07-24 (×2): qty 250

## 2022-07-24 MED ORDER — NITROGLYCERIN 0.4 MG SL SUBL: 0.4000 mg | SUBLINGUAL_TABLET | SUBLINGUAL | Status: DC | PRN

## 2022-07-24 MED ORDER — VANCOMYCIN HCL 2000 MG/400ML IV SOLN
2000.0000 mg | Freq: Once | INTRAVENOUS | Status: AC
  Administered 2022-07-24: 2000 mg via INTRAVENOUS
  Filled 2022-07-24 (×2): qty 400

## 2022-07-24 MED ORDER — SODIUM CHLORIDE 0.9 % IV SOLN
1.0000 g | INTRAVENOUS | Status: DC
  Administered 2022-07-24 – 2022-07-26 (×3): 1 g via INTRAVENOUS
  Filled 2022-07-24 (×3): qty 10

## 2022-07-24 MED ORDER — VANCOMYCIN HCL IN DEXTROSE 1-5 GM/200ML-% IV SOLN
1000.0000 mg | INTRAVENOUS | Status: DC
  Filled 2022-07-24: qty 200

## 2022-07-24 MED ORDER — HYDROMORPHONE HCL 1 MG/ML IJ SOLN
0.5000 mg | Freq: Once | INTRAMUSCULAR | Status: AC | PRN
  Administered 2022-07-24: 0.5 mg via INTRAVENOUS
  Filled 2022-07-24: qty 1

## 2022-07-24 MED ORDER — ROSUVASTATIN CALCIUM 20 MG PO TABS: 20.0000 mg | ORAL_TABLET | Freq: Every day | ORAL | Status: DC

## 2022-07-24 MED ORDER — POLYETHYLENE GLYCOL 3350 17 G PO PACK
17.0000 g | PACK | Freq: Every day | ORAL | Status: DC
  Administered 2022-07-24 – 2022-08-08 (×10): 17 g via ORAL
  Filled 2022-07-24 (×14): qty 1

## 2022-07-24 MED ORDER — CINACALCET HCL 30 MG PO TABS
90.0000 mg | ORAL_TABLET | ORAL | Status: DC
  Administered 2022-07-26 – 2022-08-07 (×6): 90 mg via ORAL
  Filled 2022-07-24 (×6): qty 3

## 2022-07-24 MED ORDER — MORPHINE SULFATE (PF) 4 MG/ML IV SOLN
4.0000 mg | Freq: Once | INTRAVENOUS | Status: AC
  Administered 2022-07-24: 4 mg via INTRAVENOUS
  Filled 2022-07-24: qty 1

## 2022-07-24 MED ORDER — AMLODIPINE BESYLATE 10 MG PO TABS
10.0000 mg | ORAL_TABLET | Freq: Every day | ORAL | Status: DC
  Administered 2022-07-24 – 2022-07-25 (×2): 10 mg via ORAL
  Filled 2022-07-24 (×2): qty 1

## 2022-07-24 MED ORDER — METOPROLOL TARTRATE 12.5 MG HALF TABLET
12.5000 mg | ORAL_TABLET | Freq: Two times a day (BID) | ORAL | Status: DC
  Administered 2022-07-24 – 2022-08-04 (×22): 12.5 mg via ORAL
  Filled 2022-07-24 (×23): qty 1

## 2022-07-24 NOTE — Progress Notes (Signed)
ANTICOAGULATION CONSULT NOTE - Follow Up Consult  Pharmacy Consult for Heparin Indication: chest pain/ACS  Allergies  Allergen Reactions   Lisinopril Anaphylaxis and Other (See Comments)    angioedema   Venofer  [Ferric Oxide]     Other reaction(s): Back Pain   Camellia Swelling and Other (See Comments)    Angioedema    Jardiance [Empagliflozin] Swelling and Rash    Patient Measurements: Height: 5\' 10"  (177.8 cm) Weight: 92.1 kg (203 lb 0.7 oz) IBW/kg (Calculated) : 68.5 Heparin Dosing Weight: 88 kg  Vital Signs: Temp: 97.5 F (36.4 C) (03/20 1855) Temp Source: Oral (03/20 1855) BP: 132/78 (03/20 1855) Pulse Rate: 94 (03/20 1803)  Labs: Recent Labs    07/23/22 2244 07/23/22 2252 07/24/22 0120 07/24/22 0733 07/24/22 1221 07/24/22 1502 07/24/22 1953  HGB 12.1 11.6* 11.8*  --  11.0*  --   --   HCT 36.9 34.0* 36.1  --  32.7*  --   --   PLT 252  --  260  --  246  --   --   HEPARINUNFRC  --   --   --   --  0.32  --  0.34  CREATININE 13.99* 16.60* 14.02*  --   --   --   --   CKTOTAL 1,948*  --   --   --   --   --   --   TROPONINIHS 2,155*  --  1,948* 6,242* 4,206* 4,276*  --      Estimated Creatinine Clearance: 5.1 mL/min (A) (by C-G formula based on SCr of 14.02 mg/dL (H)).   Medications:  Scheduled:   amLODipine  10 mg Oral Daily   aspirin EC  81 mg Oral Daily   atorvastatin  40 mg Oral Daily   Chlorhexidine Gluconate Cloth  6 each Topical Q0600   [START ON 07/26/2022] cinacalcet  90 mg Oral Q M,W,F-HD   metoprolol tartrate  12.5 mg Oral BID   pantoprazole  40 mg Oral Daily   polyethylene glycol  17 g Oral Daily   sucroferric oxyhydroxide  500 mg Oral TID WC   Infusions:   azithromycin     cefTRIAXone (ROCEPHIN)  IV     heparin 1,000 Units/hr (07/24/22 0316)   [START ON 07/26/2022] vancomycin     vancomycin      Assessment: 62 yo F continues on heparin for NSTEMI.  Heparin level at lower end of goal on 1000 units/hr.  Anticipate heparin will  accumulate some based on ESRD therefore will not increase rate at this time.  Cardiology following for possible cardiac cath.    Heparin level came back therapeutic again. We will continue with the current rate.   Goal of Therapy:  Heparin level 0.3-0.7 units/ml Monitor platelets by anticoagulation protocol: Yes   Plan:  Continue heparin at 1000 units/hr Heparin level and CBC daily while on heparin.   Onnie Boer, PharmD, BCIDP, AAHIVP, CPP Infectious Disease Pharmacist 07/24/2022 10:02 PM

## 2022-07-24 NOTE — Progress Notes (Addendum)
FMTS Interim Progress Note  S: Received page from nurse about patient having new onset abdominal pain.  Patient had not had any pain earlier in the morning during admission.  Pain reportedly started 20 minutes ago and is described as severe sharp and diffuse she has accompanied nausea with.  She denies any chest pain or shortness of breath.  She is in obvious distress.  The nurse told me that she had a similar presentation yesterday and was given pain medicine for it which did seem to help with her pain.  On chart review patient received 4 mg of morphine around 1 AM this morning.  Patient's last bowel movement was tonight.  O: BP (!) 154/72 (BP Location: Right Arm)   Pulse (!) 104   Temp 98.4 F (36.9 C) (Oral)   Resp (!) 27   Ht 5\' 10"  (1.778 m)   Wt 95.3 kg   LMP  (LMP Unknown)   SpO2 99%   BMI 30.13 kg/m   Patient in acute distress Abdominal:  bowel sounds present, non-distended, diffusely tender   A/P: New Onset Abdominal Pain: Concerning for atypical chest pain as patient is known to be having an NSTEMI. - Ordered EKG stat - Ordered troponins - Ordered KUB to rule out abdominal process - Tylenol 650 mg Q6H PRN - Dilaudid 0.5 mg once PRN, avoiding morphine due to ESRD  - Consider adding Compazine 5 mg at reduced dose due to QTc prolongation of 543  Darci Current, DO 07/24/2022, 5:54 AM PGY-1, [place] Medicine Service pager [phone]

## 2022-07-24 NOTE — Progress Notes (Signed)
Mobility Specialist Progress Note:   07/24/22 1120  Mobility  Activity Stood at bedside (x4)  Level of Assistance Minimal assist, patient does 75% or more  Assistive Device Other (Comment) (HHA)  Distance Ambulated (ft)  (unable)  Activity Response Tolerated fair  Mobility Referral Yes  $Mobility charge 1 Mobility   Pt eager for mobility session. MinA required to stand EOB, pt c/o dizziness. Pt stood total of 4 times, unable to take steps to chair d/t dizziness. HR/O2 stable on RA, BP remains elevated 160s/80s. Pt back in bed with all needs met. Sister in room.  Nelta Numbers Mobility Specialist Please contact via SecureChat or  Rehab office at [phone]

## 2022-07-24 NOTE — Progress Notes (Signed)
ANTICOAGULATION CONSULT NOTE - Initial Consult  Pharmacy Consult for heparin Indication: chest pain/ACS  Allergies  Allergen Reactions   Lisinopril Anaphylaxis and Other (See Comments)    angioedema   Venofer  [Ferric Oxide]     Other reaction(s): Back Pain   Camellia Swelling and Other (See Comments)    Angioedema    Jardiance [Empagliflozin] Swelling and Rash    Patient Measurements: Height: 5\' 10"  (177.8 cm) Weight: 95.3 kg (210 lb) IBW/kg (Calculated) : 68.5 Heparin Dosing Weight: 88 Kg  Vital Signs: Temp: 98.8 F (37.1 C) (03/19 2337) Temp Source: Oral (03/19 2337) BP: 151/92 (03/20 0030) Pulse Rate: 110 (03/20 0030)  Labs: Recent Labs    07/23/22 2244 07/23/22 2252  HGB 12.1 11.6*  HCT 36.9 34.0*  PLT 252  --   CREATININE 13.99* 16.60*  CKTOTAL 1,948*  --   TROPONINIHS 2,155*  --     Estimated Creatinine Clearance: 4.3 mL/min (A) (by C-G formula based on SCr of 16.6 mg/dL (H)).   Medical History: Past Medical History:  Diagnosis Date   Accelerated hypertension    Aortic atherosclerosis (HCC)    Arthritis of knee    bilateral   Diabetes mellitus    type 2 - no meds   Diabetic gastroparesis (HCC)    Diverticulosis    ESRD (end stage renal disease) ([place])    Fibroid uterus    GERD (gastroesophageal reflux disease)    Hyperlipidemia    Hypertension    Hypertensive emergency 02/05/2019   Hypokalemia    IDA (iron deficiency anemia)    Nausea    Nausea and vomiting in adult 03/13/2022   Paroxysmal ventricular tachycardia (HCC)    Renal disorder    Umbilical hernia    Wears dentures      Assessment: 20 yoF  with a past medical history of type 2 diabetes, hypertension and ESRD on MWF dialysis presenting today after a fall now found to have elevated troponin. No oral anticoagulation reported prior to admission. Hgb 11.6, PLT 252; pharmacy consulted to dose heparin infusion.    Goal of Therapy:  Heparin level 0.3-0.7 units/ml Monitor platelets  by anticoagulation protocol: Yes   Plan:  Give 4000 units bolus x 1 Start heparin infusion at 1000 units/hr Check anti-Xa level in 8 hours and daily while on heparin Continue to monitor H&H and platelets  Georga Bora, PharmD Clinical Pharmacist 07/24/2022 1:03 AM Please check AMION for all [place] numbers

## 2022-07-24 NOTE — Care Management (Signed)
  Transition of Care [facility]) Screening Note   Patient Details  Name: [name] Date of Birth: 27-Jul-1960   Transition of Care [facility]) CM/SW Contact:    Bethena Roys, RN Phone Number: 07/24/2022, 4:01 PM    Transition of Care Department [facility]) has reviewed the patient and no TOC needs have been identified at this time. Patient presented for chest pain after being found down by unwitnessed fall. Hx of ESRD- HD MWF. Patient will benefit from PT/OT consult. Case Manager will continue to monitor patient advancement through interdisciplinary progression rounds. If new patient transition needs arise, please place a TOC consult.

## 2022-07-24 NOTE — Consult Note (Signed)
Renal Service Consult Note [facility]  [name] 07/24/2022 Sol Blazing, MD Requesting Physician: Dr. Erin Hearing  Reason for Consult: ESRD pt sp fall at home HPI: The patient is a 62 y.o. year-old w/ PMH as below who presented to ED after a fall at home. Pt was down for about 12 hrs reportedly per ED notes. In ED pt c/o cold symptoms this past week, some N/V and gen'd weakness and cough, and also substernal CP. In ED BP's 1978/97, HR 123, SpO2 93% on RA and RR 18. Trop was 2155 and cardiology was consulted. CXR showed possible pna and patient was given IV rocephin and zithromax. IV heparin was started and pt rec'd ntg for CP. Pt was admitted. We are asked to see for dialysis.   Pt seen in her room. Not in distress, coughing. Alert, no c/o's .   ROS - denies CP, no joint pain, no HA, no blurry vision, no rash, no diarrhea, no nausea/ vomiting, no dysuria, no difficulty voiding   Past Medical History  Past Medical History:  Diagnosis Date   Accelerated hypertension    Aortic atherosclerosis (HCC)    Arthritis of knee    bilateral   Diabetes mellitus    type 2 - no meds   Diabetic gastroparesis ([place])    Diverticulosis    ESRD (end stage renal disease) ([place])    Fibroid uterus    GERD (gastroesophageal reflux disease)    Hyperlipidemia    Hypertension    Hypertensive emergency 02/05/2019   Hypokalemia    IDA (iron deficiency anemia)    Nausea    Nausea and vomiting in adult 03/13/2022   Paroxysmal ventricular tachycardia ([place])    Renal disorder    Umbilical hernia    Wears dentures    Past Surgical History  Past Surgical History:  Procedure Laterality Date   AV FISTULA PLACEMENT Left 02/17/2019   Procedure: ARTERIOVENOUS (AV) FISTULA CREATION LEFT UPPER ARM;  Surgeon: Waynetta Sandy, MD;  Location: [facility];  Service: Vascular;  Laterality: Left;   CATARACT EXTRACTION     right eye   CESAREAN SECTION     x2   CHOLECYSTECTOMY      laparoscopic   FISTULA SUPERFICIALIZATION Left 04/06/2019   Procedure: FISTULA SUPERFICIALIZATION LEFT BRACHIOCEPHALIC;  Surgeon: Waynetta Sandy, MD;  Location: [facility];  Service: Vascular;  Laterality: Left;  Transposition left arm brachiocephalic fistula.   IR FLUORO GUIDE CV LINE RIGHT  02/09/2019   IR FLUORO GUIDE CV LINE RIGHT  03/05/2019   IR US GUIDE VASC ACCESS RIGHT  02/09/2019   MULTIPLE TOOTH EXTRACTIONS     REDUCTION MAMMAPLASTY Bilateral    Family History  Family History  Problem Relation Age of Onset   Hypertension Mother    Diabetes Mother    Cancer Father        lung   Colon cancer Neg Hx    Stomach cancer Neg Hx    Esophageal cancer Neg Hx    Social History  reports that she quit smoking about 10 years ago. Her smoking use included cigarettes. She has been exposed to tobacco smoke. She has never used smokeless tobacco. She reports that she does not drink alcohol and does not use drugs. Allergies  Allergies  Allergen Reactions   Lisinopril Anaphylaxis and Other (See Comments)    angioedema   Venofer  [Ferric Oxide]     Other reaction(s): Back Pain   Camellia Swelling and Other (See  Comments)    Angioedema    Jardiance [Empagliflozin] Swelling and Rash   Home medications Prior to Admission medications   Medication Sig Start Date End Date Taking? Authorizing Provider  acetaminophen (TYLENOL) 500 MG tablet Take 1,000 mg by mouth daily as needed for moderate pain.   Yes [provider]  amLODipine (NORVASC) 10 MG tablet Take 10 mg by mouth daily. 05/14/21  Yes [provider]  aspirin 325 MG tablet Take 325 mg by mouth daily.   Yes [provider]  fluticasone (FLONASE) 50 MCG/ACT nasal spray Place 2 sprays into both nostrils daily. Patient taking differently: Place 2 sprays into both nostrils daily as needed for allergies. 03/05/22  Yes Arlyce Dice, MD  lactulose (CHRONULAC) 10 GM/15ML solution Take 10 g by mouth daily as needed for  mild constipation.   Yes [provider]  metoCLOPramide (REGLAN) 10 MG tablet Take 1 tablet (10 mg total) by mouth 4 (four) times daily -  before meals and at bedtime. Take 1/2 to 1 tablet prn 05/28/22  Yes Pyrtle, Lajuan Lines, MD  Multiple Vitamin (DAILY-VITE) TABS Take 1 tablet by mouth daily.   Yes [provider]  pantoprazole (PROTONIX) 40 MG tablet Take 1 tablet (40 mg total) by mouth daily. 05/28/22  Yes Pyrtle, Lajuan Lines, MD  polyethylene glycol (MIRALAX / GLYCOLAX) 17 g packet Take 17 g by mouth daily. 03/16/22  Yes Alesia Morin, MD  sucroferric oxyhydroxide (VELPHORO) 500 MG chewable tablet Chew 500 mg by mouth 3 (three) times daily with meals.   Yes [provider]  traMADol (ULTRAM) 50 MG tablet Take 50 mg by mouth in the morning, at noon, in the evening, and at bedtime.   Yes [provider]  Accu-Chek Softclix Lancets lancets Use as instructed Patient taking differently: 1 each by Other route See admin instructions. Use as instructed 09/29/18   Diallo, Earna Coder, MD  Blood Glucose Monitoring Suppl (ACCU-CHEK AVIVA PLUS) w/Device KIT Use to check sugar three times a day Patient taking differently: 1 each by Other route in the morning, at noon, and at bedtime. 05/14/17   [name], MD  glucose blood (ACCU-CHEK AVIVA PLUS) test strip Use as instructed Patient taking differently: 1 each by Other route See admin instructions. Use as instructed 09/29/18   Diallo, Earna Coder, MD  Lancets (ACCU-CHEK SOFT TOUCH) lancets Use to check sugars three times a day Patient taking differently: 1 each by Other route in the morning, at noon, and at bedtime. 09/24/18   Diallo, Earna Coder, MD  rosuvastatin (CRESTOR) 5 MG tablet Take 1 tablet (5 mg total) by mouth daily. Patient not taking: Reported on 07/24/2022 03/05/22   Arlyce Dice, MD     Vitals:   07/24/22 0837 07/24/22 1030 07/24/22 1100 07/24/22 1156  BP: (!) 155/74 (!) 165/84 (!) 166/71 (!) 149/79  Pulse: 100    99  Resp: (!) 23  (!) 23 20  Temp: 98 F (36.7 C)   98 F (36.7 C)  TempSrc: Oral   Oral  SpO2: 98%   98%  Weight:      Height:       Exam Gen alert, no distress, on room air No rash, cyanosis or gangrene Sclera anicteric, throat clear  No jvd or bruits Chest sig bilat rhonchi L > R, no wheezing, occ rales RRR no MRG Abd soft ntnd no mass or ascites +bs GU normal female MS no joint effusions or deformity Ext no LE or UE edema,  no wounds or ulcers Neuro is alert, Ox 3 , nf    LUA AVF+bruit    Home meds include - lacutlose prn, reglan 10 qid, velphoro 500mg  ac tid, tramadol, norvasc 10, protonix, crestor, prns/ vits/ supps     OP HD: [facility]  4h  400/500   93kg   2/2 bath  LUA AVF  Heparin 4000+ 2082midrun - last HD 3/15, post wt was 92.9kg - venofer 100mg  tiw IV thru 3/25 - sensipar 90 mg po tiw - mircera 30 mcg IV q 2 wks, last 3/1, due 3/15 but held due to Hb 11.1   Assessment/ Plan: NSTEMI - trop's 2155, 1948, getting IV heparin.  PNA - CXR w/ R sided infiltrates. May have component of vol overload as well.  ESRD - on HD MWF. Missed HD Monday. Plan HD today on schedule.  HTN - cont metoprolol and additional BP med's as needed Volume - possible edema by CXR vs other, up 2 L. Coughing and lung exam w/ diffuse rhonchi more c/w PNA. Goal UF 2- 3 L w/ HD today.  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 Cont sensipar tiw w/ HD, and cont binder.  SP fall    Kelly Splinter  MD [facility] 07/24/2022, 1:29 PM  Recent Labs  Lab 07/23/22 2244 07/23/22 2252 07/24/22 0120 07/24/22 1221  HGB 12.1 11.6* 11.8* 11.0*  ALBUMIN 3.2*  --  3.0*  --   CALCIUM 9.0  --  8.8*  --   CREATININE 13.99* 16.60* 14.02*  --   K 4.4 4.6 4.9  --    Inpatient medications:  amLODipine  10 mg Oral Daily   aspirin EC  81 mg Oral Daily   atorvastatin  40 mg Oral Daily   Chlorhexidine Gluconate Cloth  6 each Topical Q0600   metoprolol tartrate  12.5 mg Oral  BID   pantoprazole  40 mg Oral Daily   polyethylene glycol  17 g Oral Daily    azithromycin     cefTRIAXone (ROCEPHIN)  IV     heparin 1,000 Units/hr (07/24/22 0316)   vancomycin     vancomycin     acetaminophen, nitroGLYCERIN

## 2022-07-24 NOTE — Progress Notes (Signed)
Pharmacy Antibiotic Note  [name] is a 62 y.o. female admitted on 07/23/2022 with  unwitnessed fall, abd pain, multifocal pneumonia on CT scan .  Pharmacy has been consulted for Vancomycin dosing.  Pt has ESRD with HD MWF, last 3/18.  Plan: Vancomycin 2gm IV x 1 now Vancomycin 1gm IV following each HD (MWF) Follow-up MRSA PCR  Height: 5\' 10"  (177.8 cm) Weight: 95.3 kg (210 lb) IBW/kg (Calculated) : 68.5  Temp (24hrs), Avg:98.4 F (36.9 C), Min:98 F (36.7 C), Max:98.9 F (37.2 C)  Recent Labs  Lab 07/23/22 2244 07/23/22 2250 07/23/22 2252 07/24/22 0120  WBC 12.6*  --   --  11.9*  CREATININE 13.99*  --  16.60* 14.02*  LATICACIDVEN  --  1.8  --  2.2*    Estimated Creatinine Clearance: 5.1 mL/min (A) (by C-G formula based on SCr of 14.02 mg/dL (H)).    Allergies  Allergen Reactions   Lisinopril Anaphylaxis and Other (See Comments)    angioedema   Venofer  [Ferric Oxide]     Other reaction(s): Back Pain   Camellia Swelling and Other (See Comments)    Angioedema    Jardiance [Empagliflozin] Swelling and Rash    Antimicrobials this admission: Rocephin 3/19 >>  Azithro 3/19 >>  Vanc 3/20 >>  Dose adjustments this admission:   Microbiology results: 3/19 BCx: ngtd 3/20 MRSA PCR: pending  Thank you for allowing pharmacy to be a part of this patient's care.   [employer], Pharm.D., BCPS Clinical Pharmacist Clinical phone for 07/24/2022 from 7:30-3:00 is [phone].  **Pharmacist phone directory can be found on [email or web address] listed under [place].  07/24/2022 12:38 PM

## 2022-07-24 NOTE — Procedures (Signed)
   I was present at this dialysis session, have reviewed the session itself and made  appropriate changes Kelly Splinter MD [place] pager [phone]   07/24/2022, 3:27 PM

## 2022-07-24 NOTE — ED Provider Notes (Signed)
Physical Exam  BP (!) 151/92   Pulse (!) 110   Temp 98.8 F (37.1 C) (Oral)   Resp (!) 38   Ht 5\' 10"  (1.778 m)   Wt 95.3 kg   LMP  (LMP Unknown)   SpO2 98%   BMI 30.13 kg/m   Physical Exam Vitals and nursing note reviewed.  Constitutional:      General: She is not in acute distress.    Appearance: She is well-developed.  HENT:     Head: Normocephalic and atraumatic.  Eyes:     Conjunctiva/sclera: Conjunctivae normal.  Cardiovascular:     Rate and Rhythm: Regular rhythm. Tachycardia present.     Heart sounds: No murmur heard.    Comments: Upper and lower extremity pulses are symmetric bilaterally Pulmonary:     Effort: Pulmonary effort is normal. No respiratory distress.     Breath sounds: Wheezing present.  Abdominal:     Palpations: Abdomen is soft.     Tenderness: There is no abdominal tenderness.  Musculoskeletal:        General: No swelling.     Cervical back: Neck supple.  Skin:    General: Skin is warm and dry.     Capillary Refill: Capillary refill takes less than 2 seconds.  Neurological:     Mental Status: She is alert.  Psychiatric:        Mood and Affect: Mood normal.     Procedures  .Critical Care  Performed by: Sherrill Raring, PA-C Authorized by: Sherrill Raring, PA-C   Critical care provider statement:    Critical care time (minutes):  30   Critical care start time:  07/24/2022 12:00 PM   Critical care end time:  07/24/2022 12:30 PM   Critical care time was exclusive of:  Separately billable procedures and treating other patients   Critical care was necessary to treat or prevent imminent or life-threatening deterioration of the following conditions:  Cardiac failure   Critical care was time spent personally by me on the following activities:  Development of treatment plan with patient or surrogate, discussions with consultants, evaluation of patient's response to treatment, examination of patient, ordering and review of laboratory studies, ordering and  review of radiographic studies, ordering and performing treatments and interventions, pulse oximetry, re-evaluation of patient's condition and review of old charts   Care discussed with: admitting provider     ED Course / MDM    Medical Decision Making Amount and/or Complexity of Data Reviewed Labs: ordered. Radiology: ordered. ECG/medicine tests: ordered.  Risk OTC drugs. Prescription drug management. Decision regarding hospitalization.   I some care from previous provider, patient was pending admission for multifocal pneumonia.  Second troponin came back significantly elevated at 2155, slight elevation in V1 and V2 no reciprocal changes.  Patient states chest pain started upon arrival with EMS, its constant substernal with worsening over time.  He does not radiate elsewhere, not tearing or sudden onset, denies any nausea or vomiting.  I ordered aspirin, nitro, heparin.  I will consult cardiology, concern for NSTEMI given new changes in V1 and V2 therapy on the EKG.  Patient also appears to be in rhabdo.  I consulted cardiology and spoke with Dr. Antonietta Breach he will see the patient.  Agrees with some ST changes but would not call it a STEMI.  Requesting repeat EKG, agrees with heparin, admission to medicine and he will come see the patient.  Spoke with hospitalist who agrees with admission    Sherrill Raring,  PA-C 07/24/22 [id number]    Regan Lemming, MD 07/24/22 [id number]    Regan Lemming, MD 07/24/22 [phone]

## 2022-07-24 NOTE — Progress Notes (Signed)
PT Cancellation Note  Patient Details Name: [name] MRN: [id number] DOB: Jul 29, 1960   Cancelled Treatment:    Reason Eval/Treat Not Completed: Patient at procedure or test/unavailable. RN arriving to room to attempt to start an IV. PT will follow up as time allows.   Zenaida Niece 07/24/2022, 1:30 PM

## 2022-07-24 NOTE — Assessment & Plan Note (Addendum)
Continues to be disoriented. Has waxing and waning orientation.  - Aspirin, subQ heparin - Continue delirium precautions - Palliative care consult

## 2022-07-24 NOTE — H&P (Addendum)
Hospital Admission History and Physical Service Pager: [phone]  Patient name: [name] Medical record number: [id number] Date of Birth: 02/22/1961 Age: 62 y.o. Gender: female  Primary Care Provider: Lowry Ram, MD Consultants: Cardiology Code Status: Full Code Preferred Emergency Contact:  [name]: [phone]   Chief Complaint: Chest Pain  Assessment and Plan: [name] is a 62 y.o. female presenting with chest pain, found to have NSTEMI, after being found down at home following unwitnessed fall. Also found to have multifocal PNA during fall work up.    * NSTEMI (non-ST elevated myocardial infarction) [facility]) Patient presents w/ chest pain that started this evening, while en route to hospital, after an unwitnessed fall at home. Pain substernal, pressure like, sitting in center of chest. Troponin's elevated to 2,155, w/ hx of ESRD and elevate CK 1948, likely affecting troponin's. EKG w ST changes but not ST elevations. Cardiology consulted in ED w/ plans to see patient.  -Admit to [facility], attending Dr. Erin Hearing -Consult Cardiology, appreciate recs -Heparin per pharmacy -Trend Trop -ASA 325 mg -Nitroglycerin 0.4 mg sublingual prn -Vitals per unit -Cardiac Telemetry  -NPO, pending cardiology recs -PT/OT eval and treat  Multifocal pneumonia Multifocal PNA discovered on CT Cervical spine imaging, confirmed on CXR, for unwitnessed fall at home. Patient came to ED for fall, but was later noted to meet sepsis criteria w/ tachycardia to 110, RR 38, WBC 12.6, AG 22, and her CXR findings, w/ O2 sats around 95% on RA. She was started on Ceftriaxone and Azithromycin. She notes feeling ill for the last week.  -Continue Ceftriaxone -Continue Azithromycin -F/u Blood cultures   Unwitnessed fall Patient fell at home around 5:30 am. Reports she stood up and felt dizzy, w/ warmth, and increased HR. She denies any loss of consciousness but was down for ~13 hours until her  daughter and son in law called EMS to get her off the floor. Etiology of fall sounds vasovagal all though daughter reports patient is altered/confused and keeps calling out. CT Head/Cervical Spine/DG Hip all negative. Patient was complaining of chest pain at time of evaluation (see above). BP elevated to 155/92 on admission but was as low as 105/74 while in ED. Patient arrived meeting sepsis criteria, although hypotension unlikely to have been source of fall. Uremia could be contributing to AMS picture (see below).   -Consider Orthostatic Vitals   AMS (altered mental status) Patient daughter notes patient has been confused and unlike herself since she found her on the floor this evening. Patient has been randomly calling out, or saying things that do not make sense. On admission she is A&O x 3, cooperative w/ exam and able to follow commands. She does however randomly call out or say non-sensible things randomly. Patient also had unwitnessed fall earlier today that may be related. She has an elevated BUN to 57 w/ Cr of 16, that may be playing a role in her mental status. Will continue to monitor for improvement as patient also having NSTEMI and Multifocal PNA meeting SIRS criteria on admission. -CTM -Delirium precautions    ESRD on dialysis [facility]) Patient receives HD on MWF, last session on Monday. Will consult nephrology about HD today. BUN 57, Cr 16 on admission, w/ baseline of 7.0 on 03/16/22 although values have mostly been elevated ranging from 16-18 since her nader in November.  -Consult Nephrology   Type 2 diabetes mellitus without complications ([place]) Hx of DM per chart review, last A1c 5.3 03/13/22. Patient not  on DM medications -CBG checks w/ BMP    Chronic Stable Conditions HTN - Home med Amlodipine  GERD - Protonix   FEN/GI: NPO pending card's recs VTE Prophylaxis: Heparin gtt  Disposition: Progressive  History of Present Illness:  [name] is a 62 y.o. female presenting with  chest pain that started during transport by EMS to ED for unwitnessed fall at home. Patient report's chest pain is sub sternal, pressure like, and dose not radiate. EKG was significant for tachycardia w/ borderline ST changes in anterior leads. Troponins were elevated to 2155, and cardiology was consulted in ED.   Patient also comes in after unwitnessed fall noted to have multifocal PNA on imaging during workup. She complains of feeling ill for the last week with a productive cough, but denies any fevers. Reports tolerating PO well. For the unwitnessed fall, she stood up and felt dizzy w/ fast HR and warmth. She denies any LOC, but did hit the floor. She remained down for 13 hours until her daughter and son in law came home and called EMS to get her off the floor/bring her to ED. Daughter at bedside report's patient has been confused/altered today, saying things that don't make sense and calling out at times.    In ED she met sepsis criteria w/ tachycardia to 110, RR 34, WBC 12.6. She was started on Azithromycin and Ceftriaxone. Head/Neck/Hip imaging were all negative. Troponins were noted to be elevated and cardiology was consulted. She was started on a heparin drip and given nitroglycerin for CP.     Review Of Systems: Per HPI  Pertinent Past Medical History: DM ESRD GERD HLD HTN Paroxysmal Ventricular Tachycardia   Remainder reviewed in history tab.   Pertinent Past Surgical History: AV Fistula 02/17/2019 Cataract extraction right eye Cesarean section x 2 Cholecystectomy Reduction mammaplasty   Remainder reviewed in history tab.   Pertinent Social History: Tobacco use: Former for 20 years, quit 2013 Alcohol use: None Other Substance use: None Lives with daughter  Pertinent Family History: HTN and DM in mother Lung Cancer in Father  Remainder reviewed in history tab.   Important Outpatient Medications: Amlodipine 10 mg Aranesp Flonase Reglan 10 mg Protonix 40  mg Miralax 17 g Crestor 5 mg Senna  Remainder reviewed in medication history.   Objective: BP (!) 145/92 (BP Location: Right Arm)   Pulse (!) 113   Temp 98.4 F (36.9 C) (Oral)   Resp (!) 32   Ht 5\' 10"  (1.778 m)   Wt 95.3 kg   LMP  (LMP Unknown)   SpO2 97%   BMI 30.13 kg/m  Exam: General: Acute distress, confused, redirectable, compliant w/ questions Cardiovascular: S1/S2, RRR, NRMG Respiratory: Coarse breath sounds, rhonci in LRL Gastrointestinal: Soft, NTTP, non-distende MSK: Moving all extremities, no edema Neuro: A&O x 3, slightly confused, calling out sporadically   Labs:  CBC BMET  Recent Labs  Lab 07/24/22 0120  WBC 11.9*  HGB 11.8*  HCT 36.1  PLT 260   Recent Labs  Lab 07/24/22 0120  NA 139  K 4.9  CL 96*  CO2 23  BUN 64*  CREATININE 14.02*  GLUCOSE 134*  CALCIUM 8.8*    Pertinent additional labs  CK:  1948 EKG: My own interpretation (not copied from electronic read) Tachycardic w/ borderline ST changes in anterior leads, prolong QTc interval to 543    Imaging Studies Performed:  CT Head Wo Contrast  Result Date: 07/23/2022 CLINICAL DATA:  Mechanical fall at home, head  and neck trauma. EXAM: CT HEAD WITHOUT CONTRAST CT CERVICAL SPINE WITHOUT CONTRAST TECHNIQUE: Multidetector CT imaging of the head and cervical spine was performed following the standard protocol without intravenous contrast. Multiplanar CT image reconstructions of the cervical spine were also generated. RADIATION DOSE REDUCTION: This exam was performed according to the departmental dose-optimization program which includes automated exposure control, adjustment of the mA and/or kV according to patient size and/or use of iterative reconstruction technique. COMPARISON:  MR head examination dated March 18, 2022 FINDINGS: CT HEAD FINDINGS Brain: No evidence of acute infarction, hemorrhage, hydrocephalus, extra-axial collection or mass lesion/mass effect. Diffuse low-attenuation of the  periventricular white matter presumed advanced chronic microvascular ischemic changes. Vascular: No hyperdense vessel or unexpected calcification. Skull: Normal. Negative for fracture or focal lesion. Sinuses/Orbits: No acute finding. Other: None. CT CERVICAL SPINE FINDINGS Alignment: Straightening of the cervical spine. Skull base and vertebrae: No acute fracture. No primary bone lesion or focal pathologic process. Soft tissues and spinal canal: No prevertebral fluid or swelling. No visible canal hematoma. Disc levels: Mild multilevel degenerate disc disease prominent at C5-C6 and C6-C7 with anterior osteophytes. Upper chest: Right lung apex opacity measuring 0.4 x 0.9 cm partially imaged. Other: None IMPRESSION: CT head: 1. No acute intracranial abnormality. 2. Advanced chronic microvascular ischemic changes of the white matter. CT cervical spine: 1. No evidence of cervical spine fracture or traumatic subluxation. 2. 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 3. Right lung apex opacity measuring 0.9 x 0.5 cm partially imaged, which may represent pneumonia. Follow-up examination to resolution is recommended. Electronically Signed   By: Keane Police D.O.   On: 07/23/2022 23:29   CT Cervical Spine Wo Contrast  Result Date: 07/23/2022 CLINICAL DATA:  Mechanical fall at home, head and neck trauma. EXAM: CT HEAD WITHOUT CONTRAST CT CERVICAL SPINE WITHOUT CONTRAST TECHNIQUE: Multidetector CT imaging of the head and cervical spine was performed following the standard protocol without intravenous contrast. Multiplanar CT image reconstructions of the cervical spine were also generated. RADIATION DOSE REDUCTION: This exam was performed according to the departmental dose-optimization program which includes automated exposure control, adjustment of the mA and/or kV according to patient size and/or use of iterative reconstruction technique. COMPARISON:  MR head examination dated March 18, 2022  FINDINGS: CT HEAD FINDINGS Brain: No evidence of acute infarction, hemorrhage, hydrocephalus, extra-axial collection or mass lesion/mass effect. Diffuse low-attenuation of the periventricular white matter presumed advanced chronic microvascular ischemic changes. Vascular: No hyperdense vessel or unexpected calcification. Skull: Normal. Negative for fracture or focal lesion. Sinuses/Orbits: No acute finding. Other: None. CT CERVICAL SPINE FINDINGS Alignment: Straightening of the cervical spine. Skull base and vertebrae: No acute fracture. No primary bone lesion or focal pathologic process. Soft tissues and spinal canal: No prevertebral fluid or swelling. No visible canal hematoma. Disc levels: Mild multilevel degenerate disc disease prominent at C5-C6 and C6-C7 with anterior osteophytes. Upper chest: Right lung apex opacity measuring 0.4 x 0.9 cm partially imaged. Other: None IMPRESSION: CT head: 1. No acute intracranial abnormality. 2. Advanced chronic microvascular ischemic changes of the white matter. CT cervical spine: 1. No evidence of cervical spine fracture or traumatic subluxation. 2. 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 3. Right lung apex opacity measuring 0.9 x 0.5 cm partially imaged, which may represent pneumonia. Follow-up examination to resolution is recommended. Electronically Signed   By: Keane Police D.O.   On: 07/23/2022 23:29   DG Pelvis 1-2 Views  Result Date: 07/23/2022 CLINICAL  DATA:  Shortness of breath EXAM: PELVIS - 1-2 VIEW COMPARISON:  03/13/2022. FINDINGS: There is no evidence of pelvic fracture or diastasis. No pelvic bone lesions are seen. Calcified uterine fibroids confirmed by prior CT. IMPRESSION: No acute finding. Electronically Signed   By: Jorje Guild M.D.   On: 07/23/2022 22:06   DG Chest 2 View  Result Date: 07/23/2022 CLINICAL DATA:  Shortness of breath EXAM: CHEST - 2 VIEW COMPARISON:  05/02/2022 FINDINGS: Patchy bilateral airspace disease  consistent with pneumonia. Mild cardiac enlargement. No visible effusion or pneumothorax. IMPRESSION: Multifocal pneumonia. Electronically Signed   By: Jorje Guild M.D.   On: 07/23/2022 22:06      Holley Bouche, MD 07/24/2022, 4:48 AM PGY-2, [place] Intern pager: [phone], text pages welcome Secure chat group [place]

## 2022-07-24 NOTE — Progress Notes (Signed)
ANTICOAGULATION CONSULT NOTE - Follow Up Consult  Pharmacy Consult for Heparin Indication: chest pain/ACS  Allergies  Allergen Reactions   Lisinopril Anaphylaxis and Other (See Comments)    angioedema   Venofer  [Ferric Oxide]     Other reaction(s): Back Pain   Camellia Swelling and Other (See Comments)    Angioedema    Jardiance [Empagliflozin] Swelling and Rash    Patient Measurements: Height: 5\' 10"  (177.8 cm) Weight: 95.3 kg (210 lb) IBW/kg (Calculated) : 68.5 Heparin Dosing Weight: 88 kg  Vital Signs: Temp: 98 F (36.7 C) (03/20 1156) Temp Source: Oral (03/20 1156) BP: 149/79 (03/20 1156) Pulse Rate: 99 (03/20 1156)  Labs: Recent Labs    07/23/22 2244 07/23/22 2252 07/24/22 0120 07/24/22 0733 07/24/22 1221  HGB 12.1 11.6* 11.8*  --  11.0*  HCT 36.9 34.0* 36.1  --  32.7*  PLT 252  --  260  --  246  HEPARINUNFRC  --   --   --   --  0.32  CREATININE 13.99* 16.60* 14.02*  --   --   CKTOTAL 1,948*  --   --   --   --   TROPONINIHS 2,155*  --  1,948* 6,242*  --     Estimated Creatinine Clearance: 5.1 mL/min (A) (by C-G formula based on SCr of 14.02 mg/dL (H)).   Medications:  Scheduled:   amLODipine  10 mg Oral Daily   aspirin EC  81 mg Oral Daily   atorvastatin  40 mg Oral Daily   Chlorhexidine Gluconate Cloth  6 each Topical Q0600   metoprolol tartrate  12.5 mg Oral BID   pantoprazole  40 mg Oral Daily   polyethylene glycol  17 g Oral Daily   Infusions:   azithromycin     cefTRIAXone (ROCEPHIN)  IV     heparin 1,000 Units/hr (07/24/22 0316)   vancomycin     vancomycin      Assessment: 63 yo F continues on heparin for NSTEMI.  Heparin level at lower end of goal on 1000 units/hr.  Anticipate heparin will accumulate some based on ESRD therefore will not increase rate at this time.  Cardiology following for possible cardiac cath.    Goal of Therapy:  Heparin level 0.3-0.7 units/ml Monitor platelets by anticoagulation protocol: Yes   Plan:   Continue heparin at 1000 units/hr Repeat heparin level in 8 hours for confirmation. Heparin level and CBC daily while on heparin.   [employer], Pharm.D., BCPS Clinical Pharmacist Clinical phone for 07/24/2022 from 7:30-3:00 is [phone].  **Pharmacist phone directory can be found on [email or web address] listed under [place].  07/24/2022 1:37 PM

## 2022-07-24 NOTE — Progress Notes (Signed)
   07/24/22 0247  Assess: MEWS Score  Temp 98.4 F (36.9 C)  BP (!) 145/92  MAP (mmHg) 108  Pulse Rate (!) 113  ECG Heart Rate (!) 108  Resp (!) 32  Level of Consciousness Alert  SpO2 97 %  O2 Device Room Air  Assess: MEWS Score  MEWS Temp 0  MEWS Systolic 0  MEWS Pulse 1  MEWS RR 2  MEWS LOC 0  MEWS Score 3  MEWS Score Color Yellow  Assess: if the MEWS score is Yellow or Red  Were vital signs taken at a resting state? Yes  Focused Assessment Change from prior assessment (see assessment flowsheet) (Patient has beedn RED mews in ED)  Does the patient meet 2 or more of the SIRS criteria? Yes  Does the patient have a confirmed or suspected source of infection? Yes  Provider and Rapid Response Notified? No  MEWS guidelines implemented  Yes, yellow  Treat  MEWS Interventions Considered administering scheduled or prn medications/treatments as ordered  Take Vital Signs  Increase Vital Sign Frequency  Yellow: Q2hr x1, continue Q4hrs until patient remains green for 12hrs  Escalate  MEWS: Escalate Yellow: Discuss with charge nurse and consider notifying provider and/or RRT  Notify: Charge Nurse/RN  Name of Charge Nurse/RN Notified Microbiologist  Provider Notification  Provider Name/Title Dr Sabra Heck  Date Provider Notified 07/24/22  Time Provider Notified 0300  Method of Notification Page  Notification Reason Critical Result;Other (Comment) (Tachypneic and Tachycardic; Lactic Acid 2.2)  Provider response No new orders;Other (Comment) (Continue to Monitor)  Date of Provider Response 07/24/22  Time of Provider Response 0300  Assess: SIRS CRITERIA  SIRS Temperature  0  SIRS Pulse 1  SIRS Respirations  1  SIRS WBC 0  SIRS Score Sum  2

## 2022-07-24 NOTE — Assessment & Plan Note (Deleted)
-  Seen on KUB

## 2022-07-24 NOTE — Progress Notes (Signed)
Heart Failure Navigator Progress Note  Assessed for Heart & Vascular TOC clinic readiness.  Patient does not meet criteria due to ESRD on hemodialysis.   Navigator will sign off at this time.    Emaan Gary, BSN, RN Heart Failure Nurse Navigator Secure Chat Only   

## 2022-07-24 NOTE — Assessment & Plan Note (Addendum)
HD on MWF, next HD session on 4/1. Renal function at baseline reassuringly despite repeat contrast. -Nephrology following -Velphoro, Sensipar

## 2022-07-24 NOTE — Assessment & Plan Note (Deleted)
VSS. Cardiac catheterization reassuringly without obstructive disease. TEE today. -Consult Cardiology, appreciate recs -Heparin per pharmacy -ASA 81 mg -Vitals per unit -Cardiac Telemetry  -PT/OT eval and treat -Continue Metoprolol -Restart VTE prophylaxis once out from TEE

## 2022-07-24 NOTE — Assessment & Plan Note (Deleted)
Respiratory status stable. - Completed empiric antibiotic course

## 2022-07-24 NOTE — Progress Notes (Addendum)
Rounding Note    Patient Name: [name] Date of Encounter: 07/24/2022  [place] Cardiologist: None   Subjective   No chest pain. Centralized abd pain which is tender to palpitation.   Inpatient Medications    Scheduled Meds:  amLODipine  10 mg Oral Daily   aspirin  325 mg Oral Daily   Chlorhexidine Gluconate Cloth  6 each Topical Q0600   pantoprazole  40 mg Oral Daily   polyethylene glycol  17 g Oral Daily   rosuvastatin  5 mg Oral Daily   Continuous Infusions:  heparin 1,000 Units/hr (07/24/22 0316)   PRN Meds: acetaminophen, nitroGLYCERIN, perflutren lipid microspheres (DEFINITY) IV suspension   Vital Signs    Vitals:   07/24/22 0247 07/24/22 0445 07/24/22 0539 07/24/22 0837  BP: (!) 145/92 (!) 155/94 (!) 154/72 (!) 155/74  Pulse: (!) 113 (!) 107 (!) 104 100  Resp: (!) 32 (!) 22 (!) 27 (!) 23  Temp: 98.4 F (36.9 C) 98.4 F (36.9 C)  98 F (36.7 C)  TempSrc: Oral Oral  Oral  SpO2: 97% 100% 99% 98%  Weight:      Height:        Intake/Output Summary (Last 24 hours) at 07/24/2022 1012 Last data filed at 07/24/2022 0316 Gross per 24 hour  Intake 381 ml  Output --  Net 381 ml      07/23/2022   11:37 PM 07/23/2022    8:30 PM 05/28/2022    9:15 AM  Last 3 Weights  Weight (lbs) 210 lb 213 lb 13.5 oz 212 lb  Weight (kg) 95.255 kg 97 kg 96.163 kg      Telemetry    Sinus tachycardia- Personally Reviewed  ECG    Sinus Tachycardia, 103 bpm with PVC  - Personally Reviewed  Physical Exam   GEN: No acute distress.   Neck: No JVD Cardiac: RRR, no murmurs, rubs, or gallops.  Respiratory: Clear to auscultation bilaterally. GI: Soft, ++ tender, non-distended  MS: No edema; No deformity. Neuro:  Nonfocal  Psych: Normal affect   Labs    High Sensitivity Troponin:   Recent Labs  Lab 07/23/22 2244 07/24/22 0120 07/24/22 0733  TROPONINIHS 2,155* 1,948* 6,242*     Chemistry Recent Labs  Lab 07/23/22 2244 07/23/22 2252  07/24/22 0120  NA 137 136 139  K 4.4 4.6 4.9  CL 94* 101 96*  CO2 21*  --  23  GLUCOSE 136* 132* 134*  BUN 64* 57* 64*  CREATININE 13.99* 16.60* 14.02*  CALCIUM 9.0  --  8.8*  MG 2.6*  --   --   PROT 7.5  --  7.0  ALBUMIN 3.2*  --  3.0*  AST 44*  --  48*  ALT 19  --  17  ALKPHOS 57  --  51  BILITOT 0.8  --  0.6  GFRNONAA 3*  --  3*  ANIONGAP 22*  --  20*    Lipids No results for input(s): "CHOL", "TRIG", "HDL", "LABVLDL", "LDLCALC", "CHOLHDL" in the last 168 hours.  Hematology Recent Labs  Lab 07/23/22 2244 07/23/22 2252 07/24/22 0120  WBC 12.6*  --  11.9*  RBC 4.29  --  4.18  HGB 12.1 11.6* 11.8*  HCT 36.9 34.0* 36.1  MCV 86.0  --  86.4  MCH 28.2  --  28.2  MCHC 32.8  --  32.7  RDW 14.4  --  14.3  PLT 252  --  260   Thyroid No  results for input(s): "TSH", "FREET4" in the last 168 hours.  BNPNo results for input(s): "BNP", "PROBNP" in the last 168 hours.  DDimer No results for input(s): "DDIMER" in the last 168 hours.   Radiology    DG Abd 1 View  Result Date: 07/24/2022 CLINICAL DATA:  Epigastric pain over the last 3 days.  Emesis. EXAM: ABDOMEN - 1 VIEW COMPARISON:  CT 03/13/2022 FINDINGS: Bowel gas pattern does not show evidence of ileus, obstruction or visible free air. Surgical clips present in the right upper quadrant consistent with previous cholecystectomy. 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 Patchy density in the lung bases as seen on yesterday's chest radiography. IMPRESSION: No acute radiographic finding. No evidence of ileus or obstruction. Previous cholecystectomy. 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 Electronically Signed   By: Nelson Chimes M.D.   On: 07/24/2022 09:39   ECHOCARDIOGRAM COMPLETE  Result Date: 07/24/2022    ECHOCARDIOGRAM REPORT   Patient Name:   [name] Date of Exam: 07/24/2022 Medical Rec #:  [id number]    Height:        70.0 in Accession #:    [id number]   Weight:       210.0 lb Date of Birth:  [birth year]    BSA:          2.131 m Patient Age:    62 years     BP:           155/74 mmHg Patient Gender: F            HR:           93 bpm. Exam Location:  Inpatient Procedure: 2D Echo, Cardiac Doppler and Color Doppler Indications:    NSTEMI  History:        Patient has prior history of Echocardiogram examinations.  Sonographer:    NA Referring Phys: [place]  1. Left ventricular ejection fraction, by estimation, is 35 to 40%. The left ventricle has moderately decreased function. The left ventricle demonstrates regional wall motion abnormalities with anteroseptal and peri-apical hypokinesis. There is mild concentric left ventricular hypertrophy. Left ventricular diastolic parameters are consistent with Grade I diastolic dysfunction (impaired relaxation).  2. Right ventricular systolic function is normal. The right ventricular size is normal. There is mildly elevated pulmonary artery systolic pressure. The estimated right ventricular systolic pressure is 0000000 mmHg.  3. Left atrial size was mildly dilated.  4. The mitral valve is normal in structure. Trivial mitral valve regurgitation. Mild mitral stenosis. The mean mitral valve gradient is 5.0 mmHg. Moderate mitral annular calcification.  5. Tricuspid valve regurgitation is mild to moderate.  6. Small (0.9 cm) mobile vegetation attached to the aortic side of the aortic valve near the annulus. This was only seen in the parasternal short axis view. Would consider TEE for full evaluation. The aortic valve is tricuspid. There is mild calcification of the aortic valve. Aortic valve regurgitation is not visualized. No aortic stenosis is present.  7. The inferior vena cava is normal in size with greater than 50% respiratory variability, suggesting right atrial pressure of 3 mmHg. FINDINGS  Left Ventricle: Left ventricular ejection fraction, by estimation, is 35 to  40%. The left ventricle has moderately decreased function. The left ventricle demonstrates regional wall motion abnormalities. The left ventricular internal cavity size was normal in size. There is mild concentric left ventricular hypertrophy. Left ventricular diastolic  parameters are consistent with Grade I diastolic dysfunction (impaired relaxation). Right Ventricle: The right ventricular size is normal. No increase in right ventricular wall thickness. Right ventricular systolic function is normal. There is mildly elevated pulmonary artery systolic pressure. The tricuspid regurgitant velocity is 3.24  m/s, and with an assumed right atrial pressure of 3 mmHg, the estimated right ventricular systolic pressure is 0000000 mmHg. Left Atrium: Left atrial size was mildly dilated. Right Atrium: Right atrial size was normal in size. Pericardium: Trivial pericardial effusion is present. Mitral Valve: The mitral valve is normal in structure. There is mild calcification of the mitral valve leaflet(s). Moderate mitral annular calcification. Trivial mitral valve regurgitation. Mild mitral valve stenosis. MV peak gradient, 8.8 mmHg. The mean  mitral valve gradient is 5.0 mmHg. Tricuspid Valve: The tricuspid valve is normal in structure. Tricuspid valve regurgitation is mild to moderate. Aortic Valve: Small (0.9 cm) mobile vegetation attached to the aortic side of the aortic valve near the annulus. The aortic valve is tricuspid. There is mild calcification of the aortic valve. Aortic valve regurgitation is not visualized. No aortic stenosis is present. Aortic valve mean gradient measures 2.5 mmHg. Aortic valve peak gradient measures 4.9 mmHg. Aortic valve area, by VTI measures 2.92 cm. Pulmonic Valve: The pulmonic valve was normal in structure. Pulmonic valve regurgitation is not visualized. Aorta: The aortic root is normal in size and structure. Venous: The inferior vena cava is normal in size with greater than 50% respiratory  variability, suggesting right atrial pressure of 3 mmHg. IAS/Shunts: No atrial level shunt detected by color flow Doppler.  LEFT VENTRICLE PLAX 2D LVIDd:         4.90 cm   Diastology LVIDs:         3.70 cm   LV e' medial:    3.08 cm/s LV PW:         1.30 cm   LV E/e' medial:  31.0 LV IVS:        1.40 cm   LV e' lateral:   4.95 cm/s LVOT diam:     2.00 cm   LV E/e' lateral: 19.3 LV SV:         51 LV SV Index:   24 LVOT Area:     3.14 cm  RIGHT VENTRICLE             IVC RV Basal diam:  3.60 cm     IVC diam: 1.30 cm RV S prime:     13.60 cm/s TAPSE (M-mode): 2.1 cm LEFT ATRIUM           Index        RIGHT ATRIUM           Index LA diam:      3.90 cm 1.83 cm/m   RA Area:     13.40 cm LA Vol (A2C): 90.1 ml 42.28 ml/m  RA Volume:   33.05 ml  15.51 ml/m LA Vol (A4C): 51.6 ml 24.21 ml/m  AORTIC VALVE                    PULMONIC VALVE AV Area (Vmax):    2.80 cm     PV Vmax:       0.80 m/s AV Area (Vmean):   2.81 cm     PV Peak grad:  2.5 mmHg AV Area (VTI):     2.92 cm AV Vmax:           111.00 cm/s AV Vmean:  74.450 cm/s AV VTI:            0.173 m AV Peak Grad:      4.9 mmHg AV Mean Grad:      2.5 mmHg LVOT Vmax:         99.00 cm/s LVOT Vmean:        66.500 cm/s LVOT VTI:          0.161 m LVOT/AV VTI ratio: 0.93  AORTA Ao Root diam: 2.70 cm Ao Asc diam:  3.20 cm Ao Arch diam: 2.9 cm MITRAL VALVE                TRICUSPID VALVE MV Area (PHT): 10.11 cm    TR Peak grad:   42.0 mmHg MV Area VTI:   1.97 cm     TR Vmax:        324.00 cm/s MV Peak grad:  8.8 mmHg MV Mean grad:  5.0 mmHg     SHUNTS MV Vmax:       1.48 m/s     Systemic VTI:  0.16 m MV Vmean:      98.1 cm/s    Systemic Diam: 2.00 cm MV Decel Time: 75 msec MV E velocity: 95.60 cm/s MV A velocity: 167.00 cm/s MV E/A ratio:  0.57 Dalton McleanMD Electronically signed by Franki Monte Signature Date/Time: 07/24/2022/8:52:18 AM    Final    CT Head Wo Contrast  Result Date: 07/23/2022 CLINICAL DATA:  Mechanical fall at home, head and neck trauma.  EXAM: CT HEAD WITHOUT CONTRAST CT CERVICAL SPINE WITHOUT CONTRAST TECHNIQUE: Multidetector CT imaging of the head and cervical spine was performed following the standard protocol without intravenous contrast. Multiplanar CT image reconstructions of the cervical spine were also generated. RADIATION DOSE REDUCTION: This exam was performed according to the departmental dose-optimization program which includes automated exposure control, adjustment of the mA and/or kV according to patient size and/or use of iterative reconstruction technique. COMPARISON:  MR head examination dated March 18, 2022 FINDINGS: CT HEAD FINDINGS Brain: No evidence of acute infarction, hemorrhage, hydrocephalus, extra-axial collection or mass lesion/mass effect. Diffuse low-attenuation of the periventricular white matter presumed advanced chronic microvascular ischemic changes. Vascular: No hyperdense vessel or unexpected calcification. Skull: Normal. Negative for fracture or focal lesion. Sinuses/Orbits: No acute finding. Other: None. CT CERVICAL SPINE FINDINGS Alignment: Straightening of the cervical spine. Skull base and vertebrae: No acute fracture. No primary bone lesion or focal pathologic process. Soft tissues and spinal canal: No prevertebral fluid or swelling. No visible canal hematoma. Disc levels: Mild multilevel degenerate disc disease prominent at C5-C6 and C6-C7 with anterior osteophytes. Upper chest: Right lung apex opacity measuring 0.4 x 0.9 cm partially imaged. Other: None IMPRESSION: CT head: 1. No acute intracranial abnormality. 2. Advanced chronic microvascular ischemic changes of the white matter. CT cervical spine: 1. No evidence of cervical spine fracture or traumatic subluxation. 2. 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 3. Right lung apex opacity measuring 0.9 x 0.5 cm partially imaged, which may represent pneumonia. Follow-up examination to resolution is recommended. Electronically Signed    By: Keane Police D.O.   On: 07/23/2022 23:29   CT Cervical Spine Wo Contrast  Result Date: 07/23/2022 CLINICAL DATA:  Mechanical fall at home, head and neck trauma. EXAM: CT HEAD WITHOUT CONTRAST CT CERVICAL SPINE WITHOUT CONTRAST TECHNIQUE: Multidetector CT imaging of the head and cervical spine was performed following the standard protocol without intravenous contrast. Multiplanar CT image reconstructions of the  cervical spine were also generated. RADIATION DOSE REDUCTION: This exam was performed according to the departmental dose-optimization program which includes automated exposure control, adjustment of the mA and/or kV according to patient size and/or use of iterative reconstruction technique. COMPARISON:  MR head examination dated March 18, 2022 FINDINGS: CT HEAD FINDINGS Brain: No evidence of acute infarction, hemorrhage, hydrocephalus, extra-axial collection or mass lesion/mass effect. Diffuse low-attenuation of the periventricular white matter presumed advanced chronic microvascular ischemic changes. Vascular: No hyperdense vessel or unexpected calcification. Skull: Normal. Negative for fracture or focal lesion. Sinuses/Orbits: No acute finding. Other: None. CT CERVICAL SPINE FINDINGS Alignment: Straightening of the cervical spine. Skull base and vertebrae: No acute fracture. No primary bone lesion or focal pathologic process. Soft tissues and spinal canal: No prevertebral fluid or swelling. No visible canal hematoma. Disc levels: Mild multilevel degenerate disc disease prominent at C5-C6 and C6-C7 with anterior osteophytes. Upper chest: Right lung apex opacity measuring 0.4 x 0.9 cm partially imaged. Other: None IMPRESSION: CT head: 1. No acute intracranial abnormality. 2. Advanced chronic microvascular ischemic changes of the white matter. CT cervical spine: 1. No evidence of cervical spine fracture or traumatic subluxation. 2. 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 3.  Right lung apex opacity measuring 0.9 x 0.5 cm partially imaged, which may represent pneumonia. Follow-up examination to resolution is recommended. Electronically Signed   By: Keane Police D.O.   On: 07/23/2022 23:29   DG Pelvis 1-2 Views  Result Date: 07/23/2022 CLINICAL DATA:  Shortness of breath EXAM: PELVIS - 1-2 VIEW COMPARISON:  03/13/2022. FINDINGS: There is no evidence of pelvic fracture or diastasis. No pelvic bone lesions are seen. Calcified uterine fibroids confirmed by prior CT. IMPRESSION: No acute finding. Electronically Signed   By: Jorje Guild M.D.   On: 07/23/2022 22:06   DG Chest 2 View  Result Date: 07/23/2022 CLINICAL DATA:  Shortness of breath EXAM: CHEST - 2 VIEW COMPARISON:  05/02/2022 FINDINGS: Patchy bilateral airspace disease consistent with pneumonia. Mild cardiac enlargement. No visible effusion or pneumothorax. IMPRESSION: Multifocal pneumonia. Electronically Signed   By: Jorje Guild M.D.   On: 07/23/2022 22:06    Cardiac Studies   Echo: 07/24/2022  IMPRESSIONS     1. Left ventricular ejection fraction, by estimation, is 35 to 40%. The  left ventricle has moderately decreased function. The left ventricle  demonstrates regional wall motion abnormalities with anteroseptal and  peri-apical hypokinesis. There is mild  concentric left ventricular hypertrophy. Left ventricular diastolic  parameters are consistent with Grade I diastolic dysfunction (impaired  relaxation).   2. Right ventricular systolic function is normal. The right ventricular  size is normal. There is mildly elevated pulmonary artery systolic  pressure. The estimated right ventricular systolic pressure is 0000000 mmHg.   3. Left atrial size was mildly dilated.   4. The mitral valve is normal in structure. Trivial mitral valve  regurgitation. Mild mitral stenosis. The mean mitral valve gradient is 5.0  mmHg. Moderate mitral annular calcification.   5. Tricuspid valve regurgitation is mild to  moderate.   6. Small (0.9 cm) mobile vegetation attached to the aortic side of the  aortic valve near the annulus. This was only seen in the parasternal short  axis view. Would consider TEE for full evaluation. The aortic valve is  tricuspid. There is mild  calcification of the aortic valve. Aortic valve regurgitation is not  visualized. No aortic stenosis is present.   7. The inferior vena cava is normal  in size with greater than 50%  respiratory variability, suggesting right atrial pressure of 3 mmHg.   FINDINGS   Left Ventricle: Left ventricular ejection fraction, by estimation, is 35  to 40%. The left ventricle has moderately decreased function. The left  ventricle demonstrates regional wall motion abnormalities. The left  ventricular internal cavity size was  normal in size. There is mild concentric left ventricular hypertrophy.  Left ventricular diastolic parameters are consistent with Grade I  diastolic dysfunction (impaired relaxation).   Right Ventricle: The right ventricular size is normal. No increase in  right ventricular wall thickness. Right ventricular systolic function is  normal. There is mildly elevated pulmonary artery systolic pressure. The  tricuspid regurgitant velocity is 3.24   m/s, and with an assumed right atrial pressure of 3 mmHg, the estimated  right ventricular systolic pressure is 0000000 mmHg.   Left Atrium: Left atrial size was mildly dilated.   Right Atrium: Right atrial size was normal in size.   Pericardium: Trivial pericardial effusion is present.   Mitral Valve: The mitral valve is normal in structure. There is mild  calcification of the mitral valve leaflet(s). Moderate mitral annular  calcification. Trivial mitral valve regurgitation. Mild mitral valve  stenosis. MV peak gradient, 8.8 mmHg. The mean   mitral valve gradient is 5.0 mmHg.   Tricuspid Valve: The tricuspid valve is normal in structure. Tricuspid  valve regurgitation is mild to  moderate.   Aortic Valve: Small (0.9 cm) mobile vegetation attached to the aortic side  of the aortic valve near the annulus. The aortic valve is tricuspid. There  is mild calcification of the aortic valve. Aortic valve regurgitation is  not visualized. No aortic  stenosis is present. Aortic valve mean gradient measures 2.5 mmHg. Aortic  valve peak gradient measures 4.9 mmHg. Aortic valve area, by VTI measures  2.92 cm.   Pulmonic Valve: The pulmonic valve was normal in structure. Pulmonic valve  regurgitation is not visualized.   Aorta: The aortic root is normal in size and structure.   Venous: The inferior vena cava is normal in size with greater than 50%  respiratory variability, suggesting right atrial pressure of 3 mmHg.   IAS/Shunts: No atrial level shunt detected by color flow Doppler.    Patient Profile     62 y.o. female with a hx of HTN, HLD, DM 2, CVA, ESRD on HD (MWF) who was seen 07/24/2022 for the evaluation of acute acute coronary syndrome at the request of ED.   Assessment & Plan    NSTEMI -- High-sensitivity troponin 2155>> 1948>> 6242.  EKG shows sinus tachycardia with TWI in anterolateral leads. Denies any chest pain PTA or currently. Mostly c/o abd pain with nausea. Discussed with MD, given her abd pain (unclear etiology), PNA and lack of anginal symptoms will treat medically today to allow to work up of other illnesses. Will recheck hsTn as pattern seems  -- continue IV heparin, add ASA, increase statin, add low dose metoprolol  HFrEF -- Echo showed LVEF of 35 to [id number], grade 1 diastolic dysfunction, normal RV size and function, wall motion abnormalities with anterior septal and periapical hypokinesis, Takotsubo cardiomyopathy? -- volume management per HD -- GDMT: will add low dose metoprolol   Abd pain Nausea -- Complains of centralized and lower abdominal pain, tender with palpation as well as nausea -- Abdominal x-ray with no acute findings, small left  renal artery aneurysm -- work up per primary  ERSD on HD -- On Monday Wednesday  Friday schedule, last session Monday. -- Nephrology following  PNA -- Chest x-ray with multifocal pneumonia -- Currently on ceftriaxone and azithromycin  AMS Unwitnessed fall -- Mental status seems improved this morning.  Reports she lives at home with her son.  Down on the floor with around 13 hours. CK 1948 -- CT head and neck negative  HLD -- lipid panel pending -- increase Crestor to 20mg  daily   For questions or updates, please contact [place] Please consult [email or web address] for contact info under        Signed, Reino Bellis, NP  07/24/2022, 10:12 AM    I have examined the patient and reviewed assessment and plan and discussed with patient.  Agree with above as stated.    Patient with fall and prolonged time on the floor before being found by a family member.  Troponin pattern is somewhat odd and that it was decreasing and then there was a higher reading.  Patient denies any chest pain.  She stated that she had some chest pain 2 days prior to coming to the hospital but this resolved.  It has not recurred.  Her biggest complaint now is lower abdominal pain.  I personally reviewed the echo images.  She has apical hypokinesis.  This could be indicative of Takotsubo cardiomyopathy.  Given her lack of chest discomfort and concerns for sepsis.  Will just anticoagulate for now with IV heparin.  Consider cath tomorrow after a day of antibiotics to treat her pneumonia.  She is hungry and wants to eat which hopefully is a positive sign for her.  Question of aortic valve vegetation noted on echocardiogram.  I personally reviewed the echo and the aortic and mitral valves are calcified.  Blood cultures pending, but were drawn after antibiotics were started.  Will have to consider TEE depending on her clinical course.  Larae Grooms

## 2022-07-24 NOTE — Evaluation (Signed)
Physical Therapy Evaluation Patient Details Name: [name] MRN: [id number] DOB: Sep 06, 1960 Today's Date: 07/24/2022  History of Present Illness  62 y.o. female presents to [facility] hospital on 07/23/2022 with chest pain after being found down at home, found to have NSTEMI and PNA. PMH includes DM, ESRD, GERD, HLD, HTN.  Clinical Impression  Pt presents to PT with deficits in strength, power, gait, balance, endurance, cognition. Pt is flat during session and appears exhausted, with increased time between segments of bed mobility. Pt is generally weak, requiring physical assistance for all functional mobility. PT evaluation limited by arrival of transportation to HDU, otherwise pt likely to initiate early gait training. PT recommends AIR consult to determine candidacy.       Recommendations for follow up therapy are one component of a multi-disciplinary discharge planning process, led by the attending physician.  Recommendations may be updated based on patient status, additional functional criteria and insurance authorization.  Follow Up Recommendations Acute inpatient rehab (3hours/day) Can patient physically be transported by private vehicle: No    Assistance Recommended at Discharge Intermittent Supervision/Assistance  Patient can return home with the following  A lot of help with walking and/or transfers;A lot of help with bathing/dressing/bathroom;Assistance with cooking/housework;Assist for transportation;Help with stairs or ramp for entrance    Equipment Recommendations Rolling walker (2 wheels);BSC/3in1  Recommendations for Other Services       Functional Status Assessment Patient has had a recent decline in their functional status and demonstrates the ability to make significant improvements in function in a reasonable and predictable amount of time.     Precautions / Restrictions Precautions Precautions: Fall Restrictions Weight Bearing Restrictions: No      Mobility  Bed  Mobility Overal bed mobility: Needs Assistance Bed Mobility: Supine to Sit, Sit to Supine     Supine to sit: Mod assist, HOB elevated Sit to supine: Mod assist, HOB elevated        Transfers Overall transfer level: Needs assistance Equipment used: Rolling walker (2 wheels) Transfers: Sit to/from Stand Sit to Stand: Mod assist                Ambulation/Gait Ambulation/Gait assistance:  (transport arrived for HDU, limiting evaluation)                Stairs            Wheelchair Mobility    Modified Rankin (Stroke Patients Only)       Balance Overall balance assessment: Needs assistance Sitting-balance support: No upper extremity supported, Feet supported Sitting balance-Leahy Scale: Fair     Standing balance support: Bilateral upper extremity supported, Reliant on assistive device for balance Standing balance-Leahy Scale: Poor                               Pertinent Vitals/Pain Pain Assessment Pain Assessment: No/denies pain    Home Living Family/patient expects to be discharged to:: Private residence Living Arrangements: Children Available Help at Discharge: Family;Available PRN/intermittently Type of Home: Apartment Home Access: Stairs to enter Entrance Stairs-Rails: Right Entrance Stairs-Number of Steps: flight   Home Layout: One level Home Equipment: None      Prior Function Prior Level of Function : Independent/Modified Independent             Mobility Comments: ambulatory without DME, 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       Journalist, newspaper  Extremity/Trunk Assessment   Upper Extremity Assessment Upper Extremity Assessment: Generalized weakness    Lower Extremity Assessment Lower Extremity Assessment: Generalized weakness    Cervical / Trunk Assessment Cervical / Trunk Assessment: Normal  Communication   Communication: No difficulties  Cognition Arousal/Alertness: Awake/alert Behavior During  Therapy: WFL for tasks assessed/performed Overall Cognitive Status: Impaired/Different from baseline Area of Impairment: Problem solving                             Problem Solving: Slow processing          General Comments General comments (skin integrity, edema, etc.): VSS on RA, pt sheets wet with urine, purewick device not hooked up. PT assists in changing sheets    Exercises     Assessment/Plan    PT Assessment Patient needs continued PT services  PT Problem List Decreased strength;Decreased activity tolerance;Decreased balance;Decreased mobility;Cardiopulmonary status limiting activity       PT Treatment Interventions DME instruction;Gait training;Stair training;Functional mobility training;Therapeutic activities;Therapeutic exercise;Balance training;Neuromuscular re-education;Patient/family education    PT Goals (Current goals can be found in the Care Plan section)  Acute Rehab PT Goals Patient Stated Goal: to return to independence and prior level of function PT Goal Formulation: With patient Time For Goal Achievement: 08/07/22 Potential to Achieve Goals: Fair    Frequency Min 3X/week     Co-evaluation               AM-PAC PT "6 Clicks" Mobility  Outcome Measure Help needed turning from your back to your side while in a flat bed without using bedrails?: A Lot Help needed moving from lying on your back to sitting on the side of a flat bed without using bedrails?: A Lot Help needed moving to and from a bed to a chair (including a wheelchair)?: A Lot Help needed standing up from a chair using your arms (e.g., wheelchair or bedside chair)?: A Lot Help needed to walk in hospital room?: Total Help needed climbing 3-5 steps with a railing? : Total 6 Click Score: 10    End of Session   Activity Tolerance: Patient limited by fatigue Patient left: in bed;with call bell/phone within reach Nurse Communication: Mobility status PT Visit Diagnosis:  Other abnormalities of gait and mobility (R26.89);Muscle weakness (generalized) (M62.81)    Time: [id number] PT Time Calculation (min) (ACUTE ONLY): 17 min   Charges:   PT Evaluation $PT Eval Low Complexity: [place], PT, DPT Acute Rehabilitation Office [phone]   Zenaida Niece 07/24/2022, 2:10 PM

## 2022-07-24 NOTE — Progress Notes (Signed)
FMTS Brief Progress Note  S:Went bedside w/ Dr. Sabra Heck to see patient. Patient sitting up in bed, no complaints of chest or abdominal pain. Daughter at bedside.    O: BP (!) 142/90 (BP Location: Right Wrist)   Pulse (!) 102   Temp 97.7 F (36.5 C) (Oral)   Resp (!) 24   Ht 5\' 10"  (1.778 m)   Wt 92.1 kg   LMP  (LMP Unknown)   SpO2 100%   BMI 29.13 kg/m   General: NAD, sitting up in bed  A/P:  NSTEMI Patient found to have NSTEMI w/ elevated trops and no ST elevations on EKG, currently on heparin. Cardiology not planning to cath at this time. Aortic vegetations noted on echo and patient was started on vancomycin, awaiting culture results. Cardiology felt that vegetations were likely calcifications.  -Plans per day team  Multifocal PNA  Patient on Vanc/Ceftriaxone/Azithromycin for CAP w/ increased risk for  MRSA PNA being ESRD patient. Will continue abx.  -Plans per day team  - Orders reviewed. Labs for AM ordered, which was adjusted as needed.   Holley Bouche, MD 07/24/2022, 10:05 PM PGY-2, [place] Night Resident  Please page [phone] with questions.

## 2022-07-24 NOTE — Consult Note (Addendum)
Cardiology Consultation   Patient ID: [name] MRN: [id number]; DOB: October 23, 1960  Admit date: 07/23/2022 Date of Consult: 07/24/2022  PCP:  Lowry Ram, [place] Providers Cardiologist:  None        Patient Profile:   [name] is a 62 y.o. female with a hx of HTN, HLD, DM 2, CVA, ESRD on HD (MWF) who is being seen 07/24/2022 for the evaluation of acute acute coronary syndrome at the request of ED.  History of Present Illness:   [name] states that earlier this morning she had a fall where she felt weak and had a ground-level fall.  She was at home alone and her son was at work so she was on the ground for an extended amount of time before family found her and brought her to the ED this evening.  She denies syncope or loss of consciousness prior to her fall, denies head strike.  She denies any preceding palpitations, chest pain, shortness of breath preceding her fall.  She states that she has been dealing with an upper respiratory infection for the last week with symptomatology of cough that is productive of yellow sputum.  And route to the ED she states that she developed sudden onset 8 out of 10 chest pain that was substernal in nature which improved to 5 out of 10 upon arrival to the ED.  States that she has never had chest pain like this before, it was not worse with inspiration or position.  She denies PND, orthopnea, or lower extremity edema.  In terms of her cardiac workup, most recent echocardiogram was in 2021 demonstrated normal LV function with moderate asymmetric LVH of the basal septal segments, grade 1 diastolic dysfunction.  No gross valvular abnormalities.  She had a limited nuke stress in 2022 that was low risk with normal LV function.  On arrival to the ED, she was afebrile but tachycardic to the 120s.  Blood pressures [id number] systolics, tachypneic to the high 30s but satting well on room air.  Labs were notable for leukocytosis to 12.6.  Sodium  was 137, K4.4, creatinine 14 (she denies missing any dialysis and her last session was on Monday; baseline creatinine anywhere 7-18 on review).  CK was 1900 and high-sensitivity troponin was 2155 (priors 40-60).  Lactic acid 1.8.  EKG was sinus tachycardia, right atrial enlargement, V1 through V2 with 1 to 1.5 mm ST elevations in the setting of left ventricular hypertrophy.  No reciprocal depressions, T wave versions in aVL.  CT head negative.  Chest x-ray with multifocal pneumonia.  Patient was aspirin loaded and started on heparin.  Spoke with interventional cardiologist Dr. Ellyn Hack overnight who felt like EKG more consistent with LVH and NSTEMI rather than STEMI.   Past Medical History:  Diagnosis Date   Accelerated hypertension    Aortic atherosclerosis (HCC)    Arthritis of knee    bilateral   Diabetes mellitus    type 2 - no meds   Diabetic gastroparesis ([place])    Diverticulosis    ESRD (end stage renal disease) ([place])    Fibroid uterus    GERD (gastroesophageal reflux disease)    Hyperlipidemia    Hypertension    Hypertensive emergency 02/05/2019   Hypokalemia    IDA (iron deficiency anemia)    Nausea    Nausea and vomiting in adult 03/13/2022   Paroxysmal ventricular tachycardia (HCC)    Renal disorder    Umbilical hernia  Wears dentures     Past Surgical History:  Procedure Laterality Date   AV FISTULA PLACEMENT Left 02/17/2019   Procedure: ARTERIOVENOUS (AV) FISTULA CREATION LEFT UPPER ARM;  Surgeon: Waynetta Sandy, MD;  Location: [facility];  Service: Vascular;  Laterality: Left;   CATARACT EXTRACTION     right eye   CESAREAN SECTION     x2   CHOLECYSTECTOMY     laparoscopic   FISTULA SUPERFICIALIZATION Left 04/06/2019   Procedure: FISTULA SUPERFICIALIZATION LEFT BRACHIOCEPHALIC;  Surgeon: Waynetta Sandy, MD;  Location: [facility];  Service: Vascular;  Laterality: Left;  Transposition left arm brachiocephalic fistula.   IR FLUORO GUIDE CV LINE RIGHT   02/09/2019   IR FLUORO GUIDE CV LINE RIGHT  03/05/2019   IR US GUIDE VASC ACCESS RIGHT  02/09/2019   MULTIPLE TOOTH EXTRACTIONS     REDUCTION MAMMAPLASTY Bilateral      Home Medications:  Prior to Admission medications   Medication Sig Start Date End Date Taking? Authorizing Provider  amLODipine (NORVASC) 10 MG tablet Take 10 mg by mouth daily. 05/14/21  Yes [provider]  aspirin 325 MG tablet Take 325 mg by mouth daily.   Yes [provider]  fluticasone (FLONASE) 50 MCG/ACT nasal spray Place 2 sprays into both nostrils daily. Patient taking differently: Place 2 sprays into both nostrils daily as needed for allergies. 03/05/22  Yes Arlyce Dice, MD  metoCLOPramide (REGLAN) 10 MG tablet Take 1 tablet (10 mg total) by mouth 4 (four) times daily -  before meals and at bedtime. Take 1/2 to 1 tablet prn 05/28/22  Yes Pyrtle, Lajuan Lines, MD  pantoprazole (PROTONIX) 40 MG tablet Take 1 tablet (40 mg total) by mouth daily. 05/28/22  Yes Pyrtle, Lajuan Lines, MD  polyethylene glycol (MIRALAX / GLYCOLAX) 17 g packet Take 17 g by mouth daily. 03/16/22  Yes Alesia Morin, MD  sucroferric oxyhydroxide (VELPHORO) 500 MG chewable tablet Chew 500 mg by mouth 3 (three) times daily with meals.   Yes [provider]  Accu-Chek Softclix Lancets lancets Use as instructed Patient taking differently: 1 each by Other route See admin instructions. Use as instructed 09/29/18   Diallo, Earna Coder, MD  Blood Glucose Monitoring Suppl (ACCU-CHEK AVIVA PLUS) w/Device KIT Use to check sugar three times a day Patient taking differently: 1 each by Other route in the morning, at noon, and at bedtime. 05/14/17   [name], MD  Darbepoetin Alfa (ARANESP) 200 MCG/0.4ML SOSY injection Inject 0.4 mLs (200 mcg total) into the vein every Wednesday with hemodialysis. Patient not taking: Reported on 07/24/2022 10/06/19   Cathlyn Parsons, PA-C  glucose blood (ACCU-CHEK AVIVA PLUS) test strip Use as  instructed Patient taking differently: 1 each by Other route See admin instructions. Use as instructed 09/29/18   Diallo, Earna Coder, MD  Lancets (ACCU-CHEK SOFT TOUCH) lancets Use to check sugars three times a day Patient taking differently: 1 each by Other route in the morning, at noon, and at bedtime. 09/24/18   Diallo, Earna Coder, MD  rosuvastatin (CRESTOR) 5 MG tablet Take 1 tablet (5 mg total) by mouth daily. Patient not taking: Reported on 07/24/2022 03/05/22   Arlyce Dice, MD    Inpatient Medications: Scheduled Meds:  aspirin  325 mg Oral Daily   rosuvastatin  5 mg Oral Daily   Continuous Infusions:  heparin 1,000 Units/hr (07/24/22 0125)   PRN Meds: nitroGLYCERIN  Allergies:    Allergies  Allergen Reactions   Lisinopril Anaphylaxis and Other (  See Comments)    angioedema   Venofer  [Ferric Oxide]     Other reaction(s): Back Pain   Camellia Swelling and Other (See Comments)    Angioedema    Jardiance [Empagliflozin] Swelling and Rash    Social History:   Social History   Socioeconomic History   Marital status: Legally Separated    Spouse name: Not on file   Number of children: 2   Years of education: 12   Highest education level: 12th grade  Occupational History   Occupation: housekeeping   Occupation: disabled  Tobacco Use   Smoking status: Former    Years: 20    Types: Cigarettes    Quit date: 10/16/2011    Years since quitting: 10.7    Passive exposure: Past   Smokeless tobacco: Never   Tobacco comments:    1 pack will last a week  Vaping Use   Vaping Use: Never used  Substance and Sexual Activity   Alcohol use: No    Alcohol/week: 0.0 standard drinks of alcohol   Drug use: No   Sexual activity: Not Currently  Other Topics Concern   Not on file  Social History Narrative   Patient and her son live together in [place].    Patient has (1) son and (1) daughter- they are very close.    Patient walks 30 minutes a day for exercise.    Patient is very  active in her church community.    Social Determinants of Health   Financial Resource Strain: Low Risk  (05/31/2021)   Overall Financial Resource Strain (CARDIA)    Difficulty of Paying Living Expenses: Not hard at all  Food Insecurity: No Food Insecurity (05/31/2021)   Hunger Vital Sign    Worried About Running Out of Food in the Last Year: Never true    Ran Out of Food in the Last Year: Never true  Transportation Needs: No Transportation Needs (05/31/2021)   PRAPARE - Hydrologist (Medical): No    Lack of Transportation (Non-Medical): No  Physical Activity: Sufficiently Active (05/31/2021)   Exercise Vital Sign    Days of Exercise per Week: 7 days    Minutes of Exercise per Session: 30 min  Stress: No Stress Concern Present (05/31/2021)   [place]    Feeling of Stress : Only a little  Social Connections: Moderately Integrated (05/31/2021)   Social Connection and Isolation Panel [NHANES]    Frequency of Communication with Friends and Family: More than three times a week    Frequency of Social Gatherings with Friends and Family: More than three times a week    Attends Religious Services: More than 4 times per year    Active Member of [employer] or Organizations: Yes    Attends Archivist Meetings: More than 4 times per year    Marital Status: Separated  Intimate Partner Violence: Not At Risk (05/31/2021)   Humiliation, Afraid, Rape, and Kick questionnaire    Fear of Current or Ex-Partner: No    Emotionally Abused: No    Physically Abused: No    Sexually Abused: No    Family History:    Family History  Problem Relation Age of Onset   Hypertension Mother    Diabetes Mother    Cancer Father        lung   Colon cancer Neg Hx    Stomach cancer Neg Hx    Esophageal  cancer Neg Hx      ROS:  Please see the history of present illness.   All other ROS reviewed and negative.      Physical Exam/Data:   Vitals:   07/23/22 2337 07/24/22 0000 07/24/22 0029 07/24/22 0030  BP: (!) 158/82 105/74 131/82 (!) 151/92  Pulse: (!) 108 (!) 110 (!) 111 (!) 110  Resp: (!) 30 (!) 39 (!) 33 (!) 38  Temp: 98.8 F (37.1 C)     TempSrc: Oral     SpO2: 95% 100% 97% 98%  Weight: 95.3 kg     Height: 5\' 10"  (1.778 m)      No intake or output data in the 24 hours ending 07/24/22 0233    07/23/2022   11:37 PM 07/23/2022    8:30 PM 05/28/2022    9:15 AM  Last 3 Weights  Weight (lbs) 210 lb 213 lb 13.5 oz 212 lb  Weight (kg) 95.255 kg 97 kg 96.163 kg     Body mass index is 30.13 kg/m.  General:  Well nourished, well developed, in mild respiratory distress HEENT: normal Neck: no JVD Vascular: No carotid bruits; Distal pulses 2+ bilaterally Cardiac:  normal S1, S2; RRR; no murmur  Lungs: Bilateral coarse breath sounds throughout with mild expiratory wheeze Abd: soft, nontender, no hepatomegaly  Ext: no edema Musculoskeletal:  No deformities, BUE and BLE strength normal and equal Skin: warm and dry  Neuro:  CNs 2-12 intact, no focal abnormalities noted Psych:  Normal affect   EKG:  The EKG was personally reviewed and demonstrates: Sinus tachycardia with borderline ST elevations V1 through V2 in the setting of left ventricular hypertrophy, T wave version in aVL Telemetry:  Telemetry was personally reviewed and demonstrates: Sinus tachycardia  Relevant CV Studies:  Stress 03/28/2021   Limited stress-only study due to signficant extracardiac tracer uptake.   The study is low risk.   No ST deviation was noted during stress test.   There is a small defect with mild reduction in uptake present in the mid to basal inferior location(s). There is normal wall motion in the defect area. Defect is most likely consistent with artifact caused by bowel tracer uptake.   Left ventricular function is normal. End diastolic cavity size is normal.   Prior study not available for  comparison.  TTE 09/19/2019 IMPRESSIONS     1. Left ventricular ejection fraction, by estimation, is 55 to 60%. The  left ventricle has normal function. The left ventricle has no regional  wall motion abnormalities. There is moderate asymmetric left ventricular  hypertrophy of the basal and septal  segments. Left ventricular diastolic parameters are consistent with Grade  I diastolic dysfunction (impaired relaxation).   2. Right ventricular systolic function is normal. The right ventricular  size is normal. There is normal pulmonary artery systolic pressure.   3. The mitral valve is normal in structure. No evidence of mitral valve  regurgitation. No evidence of mitral stenosis.   4. The aortic valve is tricuspid. Aortic valve regurgitation is not  visualized. Mild to moderate aortic valve sclerosis/calcification is  present, without any evidence of aortic stenosis.   5. The inferior vena cava is normal in size with greater than 50%  respiratory variability, suggesting right atrial pressure of 3 mmHg.   Laboratory Data:  High Sensitivity Troponin:   Recent Labs  Lab 07/23/22 2244  TROPONINIHS 2,155*     Chemistry Recent Labs  Lab 07/23/22 2244 07/23/22 2252  NA 137 136  K 4.4 4.6  CL 94* 101  CO2 21*  --   GLUCOSE 136* 132*  BUN 64* 57*  CREATININE 13.99* 16.60*  CALCIUM 9.0  --   MG 2.6*  --   GFRNONAA 3*  --   ANIONGAP 22*  --     Recent Labs  Lab 07/23/22 2244  PROT 7.5  ALBUMIN 3.2*  AST 44*  ALT 19  ALKPHOS 57  BILITOT 0.8   Lipids No results for input(s): "CHOL", "TRIG", "HDL", "LABVLDL", "LDLCALC", "CHOLHDL" in the last 168 hours.  Hematology Recent Labs  Lab 07/23/22 2244 07/23/22 2252 07/24/22 0120  WBC 12.6*  --  11.9*  RBC 4.29  --  4.18  HGB 12.1 11.6* 11.8*  HCT 36.9 34.0* 36.1  MCV 86.0  --  86.4  MCH 28.2  --  28.2  MCHC 32.8  --  32.7  RDW 14.4  --  14.3  PLT 252  --  260   Thyroid No results for input(s): "TSH", "FREET4" in the  last 168 hours.  BNPNo results for input(s): "BNP", "PROBNP" in the last 168 hours.  DDimer No results for input(s): "DDIMER" in the last 168 hours.   Radiology/Studies:  CT Head Wo Contrast  Result Date: 07/23/2022 CLINICAL DATA:  Mechanical fall at home, head and neck trauma. EXAM: CT HEAD WITHOUT CONTRAST CT CERVICAL SPINE WITHOUT CONTRAST TECHNIQUE: Multidetector CT imaging of the head and cervical spine was performed following the standard protocol without intravenous contrast. Multiplanar CT image reconstructions of the cervical spine were also generated. RADIATION DOSE REDUCTION: This exam was performed according to the departmental dose-optimization program which includes automated exposure control, adjustment of the mA and/or kV according to patient size and/or use of iterative reconstruction technique. COMPARISON:  MR head examination dated March 18, 2022 FINDINGS: CT HEAD FINDINGS Brain: No evidence of acute infarction, hemorrhage, hydrocephalus, extra-axial collection or mass lesion/mass effect. Diffuse low-attenuation of the periventricular white matter presumed advanced chronic microvascular ischemic changes. Vascular: No hyperdense vessel or unexpected calcification. Skull: Normal. Negative for fracture or focal lesion. Sinuses/Orbits: No acute finding. Other: None. CT CERVICAL SPINE FINDINGS Alignment: Straightening of the cervical spine. Skull base and vertebrae: No acute fracture. No primary bone lesion or focal pathologic process. Soft tissues and spinal canal: No prevertebral fluid or swelling. No visible canal hematoma. Disc levels: Mild multilevel degenerate disc disease prominent at C5-C6 and C6-C7 with anterior osteophytes. Upper chest: Right lung apex opacity measuring 0.4 x 0.9 cm partially imaged. Other: None IMPRESSION: CT head: 1. No acute intracranial abnormality. 2. Advanced chronic microvascular ischemic changes of the white matter. CT cervical spine: 1. No evidence of  cervical spine fracture or traumatic subluxation. 2. 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 3. Right lung apex opacity measuring 0.9 x 0.5 cm partially imaged, which may represent pneumonia. Follow-up examination to resolution is recommended. Electronically Signed   By: Keane Police D.O.   On: 07/23/2022 23:29   CT Cervical Spine Wo Contrast  Result Date: 07/23/2022 CLINICAL DATA:  Mechanical fall at home, head and neck trauma. EXAM: CT HEAD WITHOUT CONTRAST CT CERVICAL SPINE WITHOUT CONTRAST TECHNIQUE: Multidetector CT imaging of the head and cervical spine was performed following the standard protocol without intravenous contrast. Multiplanar CT image reconstructions of the cervical spine were also generated. RADIATION DOSE REDUCTION: This exam was performed according to the departmental dose-optimization program which includes automated exposure control, adjustment of the mA and/or kV according to patient size and/or  use of iterative reconstruction technique. COMPARISON:  MR head examination dated March 18, 2022 FINDINGS: CT HEAD FINDINGS Brain: No evidence of acute infarction, hemorrhage, hydrocephalus, extra-axial collection or mass lesion/mass effect. Diffuse low-attenuation of the periventricular white matter presumed advanced chronic microvascular ischemic changes. Vascular: No hyperdense vessel or unexpected calcification. Skull: Normal. Negative for fracture or focal lesion. Sinuses/Orbits: No acute finding. Other: None. CT CERVICAL SPINE FINDINGS Alignment: Straightening of the cervical spine. Skull base and vertebrae: No acute fracture. No primary bone lesion or focal pathologic process. Soft tissues and spinal canal: No prevertebral fluid or swelling. No visible canal hematoma. Disc levels: Mild multilevel degenerate disc disease prominent at C5-C6 and C6-C7 with anterior osteophytes. Upper chest: Right lung apex opacity measuring 0.4 x 0.9 cm partially imaged. Other: None  IMPRESSION: CT head: 1. No acute intracranial abnormality. 2. Advanced chronic microvascular ischemic changes of the white matter. CT cervical spine: 1. No evidence of cervical spine fracture or traumatic subluxation. 2. 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 3. Right lung apex opacity measuring 0.9 x 0.5 cm partially imaged, which may represent pneumonia. Follow-up examination to resolution is recommended. Electronically Signed   By: Keane Police D.O.   On: 07/23/2022 23:29   DG Pelvis 1-2 Views  Result Date: 07/23/2022 CLINICAL DATA:  Shortness of breath EXAM: PELVIS - 1-2 VIEW COMPARISON:  03/13/2022. FINDINGS: There is no evidence of pelvic fracture or diastasis. No pelvic bone lesions are seen. Calcified uterine fibroids confirmed by prior CT. IMPRESSION: No acute finding. Electronically Signed   By: Jorje Guild M.D.   On: 07/23/2022 22:06   DG Chest 2 View  Result Date: 07/23/2022 CLINICAL DATA:  Shortness of breath EXAM: CHEST - 2 VIEW COMPARISON:  05/02/2022 FINDINGS: Patchy bilateral airspace disease consistent with pneumonia. Mild cardiac enlargement. No visible effusion or pneumothorax. IMPRESSION: Multifocal pneumonia. Electronically Signed   By: Jorje Guild M.D.   On: 07/23/2022 22:06     Assessment and Plan:   NSTEMI Patient presents with what appears to be a week of upper respiratory infection secondary to multifocal pneumonia with acute onset chest pain today and marked troponin elevation (compared to prior) in the setting of end-stage renal disease.  She has known left ventricular hypertrophy with EKG repolarization abnormalities on prior EKGs.  Today's EKG and prior EKG comparator were reviewed with Dr. Ellyn Hack overnight.  Overall felt like presentation more consistent with NSTEMI vs. demand ischemia in the setting of infection.  Possible that she has component of myocarditis given her underlying infection.  Troponin elevation can be seen in end-stage  renal disease given its renally cleared; however, her creatinine in the past has been in the 13's with only minimal troponin elevation.  Given her age and comorbidities reasonable to treat with heparin and aspirin for NSTEMI.  Would obtain TTE for structural and functional evaluation tomorrow. -Continue heparin -Post aspirin load, continue aspirin 81 mg daily -Please order TTE for tomorrow -Keep n.p.o. after midnight in case of ischemic evaluation tomorrow  -Continue telemetry -Continue rosuvastatin 5 mg -If recurrence of chest pain please obtain repeat EKG and biomarkers  2.  Multifocal pneumonia -Management per primary team  Risk Assessment/Risk Scores:     TIMI Risk Score for Unstable Angina or Non-ST Elevation MI:   The patient's TIMI risk score is 4, which indicates a 20% risk of all cause mortality, new or recurrent myocardial infarction or need for urgent revascularization in the next 14 days.  For questions or updates, please contact [place] Please consult [email or web address] for contact info under    Signed, Silas Flood, MD  07/24/2022 2:33 AM

## 2022-07-24 NOTE — Assessment & Plan Note (Addendum)
Likely multifactorial in the setting of sepsis, and uremia. -Will treat sepsis and uremia as above -Consider orthostatics -Left foot XR -Patient likely CIR candidate

## 2022-07-24 NOTE — Progress Notes (Addendum)
Daily Progress Note Intern Pager: [phone]  Patient name: [name] Medical record number: [id number] Date of birth: March 19, 1961 Age: 62 y.o. Gender: female  Primary Care Provider: Lowry Ram, MD Consultants: Cardiology Code Status: Full Code  Pt Overview and Major Events to Date:  -Admitted 07/24/22  Assessment and Plan: KEYAIRAH ROMANS is a 62 y.o. female admitted for NSTEMI in the setting of multifocal pneumonia. Pertinent PMH/PSH includes ESRD on HD MWF, HTN, DM, OSA, HLD, Hx CVA.   * NSTEMI (non-ST elevated myocardial infarction) (HCC) VSS. Chest pain resolved but having abdominal pain. ECHO showing apical and anteroseptal wall motion abnormalities. Cardiology considering heat catheterization. Troponin increased 6242. -Consult Cardiology, appreciate recs -Heparin per pharmacy -Trend Trop -ASA 325 mg -Nitroglycerin 0.4 mg sublingual prn -Vitals per unit -Cardiac Telemetry  -PT/OT eval and treat  Multifocal pneumonia Continue to be afebrile, and remains stable on RA. WBC 11.9. ECHO showing aortic valve vegetation. Patient is higher risk for endocarditis given ESRD on HD. Will expand antibiotics with Vancomycin. -Continue Ceftriaxone -Continue Azithromycin -Vancomycin per pharmacy consult -F/u Blood cultures -AM CBC, BMP   Unwitnessed fall Likely multifactorial in the setting of sepsis, and uremia.  -Will treat sepsis and uremia as above -Consider orthostatics  AMS (altered mental status) Likely multifactorial in the setting of sepsis and uremia. Expect to approve with treatment of underlying issues -Delirium precautions   -Scheduled HD -Pneumonia treatment as above  ESRD on dialysis ([place]) BL Cr 7.0. HD on MWF. -Nephrology consulted recs, appreciated -Likely dialysis today -Electrolyte management per dialysis   Type 2 diabetes mellitus without complications ([place]) Hx of DM per chart review, last A1c 5.3 03/13/22. Patient not on DM medications -CBG  checks w/ BMP    FEN/GI: NPO PPx: Heparin Dispo: Pending clinical improvement  Subjective:  No acute event since admission. Patient denies chest pain, but does endorse abdominal pain that has been present since admission. States she feels confused, which is not her baseline.  Objective: Temp:  [98 F (36.7 C)-98.9 F (37.2 C)] 98 F (36.7 C) (03/20 1156) Pulse Rate:  [99-121] 99 (03/20 1156) Resp:  [18-39] 20 (03/20 1156) BP: (105-177)/(71-94) 149/79 (03/20 1156) SpO2:  [90 %-100 %] 98 % (03/20 1156) Weight:  [95.3 kg-97 kg] 95.3 kg (03/19 2337) Physical Exam: General: NAD, sleeping in hospital bed Neuro: A&O, unaware of year, but alert to self, person, place Cardiovascular: tachycardic, RR, no murmurs, no peripheral edema Respiratory: normal WOB on RA, bilateral upper lung field rhonchi Abdomen: soft, NTTP, no rebound or guarding Extremities: Moving all 4 extremities equally   Laboratory: Most recent CBC Lab Results  Component Value Date   WBC 11.9 (H) 07/24/2022   HGB 11.8 (L) 07/24/2022   HCT 36.1 07/24/2022   MCV 86.4 07/24/2022   PLT 260 07/24/2022   Most recent BMP    Latest Ref Rng & Units 07/24/2022    1:20 AM  BMP  Glucose 70 - 99 mg/dL 134   BUN 8 - 23 mg/dL 64   Creatinine 0.44 - 1.00 mg/dL 14.02   Sodium 135 - 145 mmol/L 139   Potassium 3.5 - 5.1 mmol/L 4.9   Chloride 98 - 111 mmol/L 96   CO2 22 - 32 mmol/L 23   Calcium 8.9 - 10.3 mg/dL 8.8     Other pertinent labs:  LA - 2.2  Troponin pending  Imaging/Diagnostic Tests:  KUB 07/24/22 No acute radiographic finding. No evidence of ileus or obstruction. Previous cholecystectomy.  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  ECHO 07/24/22 1. Left ventricular ejection fraction, by estimation, is 35 to 40%. The  left ventricle has moderately decreased function. The left ventricle  demonstrates regional wall motion abnormalities with anteroseptal and  peri-apical  hypokinesis. There is mild  concentric left ventricular hypertrophy. Left ventricular diastolic  parameters are consistent with Grade I diastolic dysfunction (impaired  relaxation).   2. Right ventricular systolic function is normal. The right ventricular  size is normal. There is mildly elevated pulmonary artery systolic  pressure. The estimated right ventricular systolic pressure is 0000000 mmHg.   3. Left atrial size was mildly dilated.   4. The mitral valve is normal in structure. Trivial mitral valve  regurgitation. Mild mitral stenosis. The mean mitral valve gradient is 5.0  mmHg. Moderate mitral annular calcification.   5. Tricuspid valve regurgitation is mild to moderate.   6. Small (0.9 cm) mobile vegetation attached to the aortic side of the  aortic valve near the annulus. This was only seen in the parasternal short  axis view. Would consider TEE for full evaluation. The aortic valve is  tricuspid. There is mild  calcification of the aortic valve. Aortic valve regurgitation is not  visualized. No aortic stenosis is present.   7. The inferior vena cava is normal in size with greater than 50%  respiratory variability, suggesting right atrial pressure of 3 mmHg.   Salvadore Oxford, MD 07/24/2022, 12:11 PM  PGY-1, [place] Intern pager: [phone], text pages welcome Secure chat group [place]

## 2022-07-24 NOTE — Assessment & Plan Note (Deleted)
Hg A1c 5.4. Hx of DM per chart review. Patient not on DM medications -CBG checks w/ BMP

## 2022-07-24 NOTE — ED Notes (Signed)
ED TO INPATIENT HANDOFF REPORT  ED Nurse Name and Phone #: Randall Hiss [phone]  S Name/Age/Gender [name] 62 y.o. female Room/Bed: 035C/035C  Code Status   Code Status: Full Code  Home/SNF/Other Home Patient oriented to: self Is this baseline? Yes   Triage Complete: Triage complete  Chief Complaint NSTEMI (non-ST elevated myocardial infarction) [facility]) [I21.4]  Triage Note Pt arrived via [facility] from home for fall. Pt suffered mechanical fall around 5am this morning, denies striking head, loc, pain, or injury from fall. Pt was found down by son at 9 and EMS was called. Pt reports weakness, dizziness when standing. Pt reported that she has been sick with a "bad cold" this week, endorses nausea/vomiting, weakness, and cough. PMH dialysis, attended normal treatments this week.   BP 197/97 HR 123 SPO2 93% RA RR 18 CBG 176   Allergies Allergies  Allergen Reactions   Lisinopril Anaphylaxis and Other (See Comments)    angioedema   Venofer  [Ferric Oxide]     Other reaction(s): Back Pain   Camellia Swelling and Other (See Comments)    Angioedema    Jardiance [Empagliflozin] Swelling and Rash    Level of Care/Admitting Diagnosis ED Disposition     ED Disposition  Admit   Condition  --   [place]: [place] [[id number]]  Level of Care: Progressive [102]  Admit to Progressive based on following criteria: CARDIOVASCULAR & THORACIC of moderate stability with acute coronary syndrome symptoms/low risk myocardial infarction/hypertensive urgency/arrhythmias/heart failure potentially compromising stability and stable post cardiovascular intervention patients.  May admit patient to [facility] or [facility] if equivalent level of care is available:: No  Covid Evaluation: Asymptomatic - no recent exposure (last 10 days) testing not required  Diagnosis: NSTEMI (non-ST elevated myocardial infarction) [facility]) [id number]  Admitting Physician: [place], Leona  Attending Physician: Lind Covert [id number]  Certification:: I certify this patient will need inpatient services for at least 2 midnights  Estimated Length of Stay: 2          B Medical/Surgery History Past Medical History:  Diagnosis Date   Accelerated hypertension    Aortic atherosclerosis ([place])    Arthritis of knee    bilateral   Diabetes mellitus    type 2 - no meds   Diabetic gastroparesis ([place])    Diverticulosis    ESRD (end stage renal disease) ([place])    Fibroid uterus    GERD (gastroesophageal reflux disease)    Hyperlipidemia    Hypertension    Hypertensive emergency 02/05/2019   Hypokalemia    IDA (iron deficiency anemia)    Nausea    Nausea and vomiting in adult 03/13/2022   Paroxysmal ventricular tachycardia ([place])    Renal disorder    Umbilical hernia    Wears dentures    Past Surgical History:  Procedure Laterality Date   AV FISTULA PLACEMENT Left 02/17/2019   Procedure: ARTERIOVENOUS (AV) FISTULA CREATION LEFT UPPER ARM;  Surgeon: Waynetta Sandy, MD;  Location: [facility];  Service: Vascular;  Laterality: Left;   CATARACT EXTRACTION     right eye   CESAREAN SECTION     x2   CHOLECYSTECTOMY     laparoscopic   FISTULA SUPERFICIALIZATION Left 04/06/2019   Procedure: FISTULA SUPERFICIALIZATION LEFT BRACHIOCEPHALIC;  Surgeon: Waynetta Sandy, MD;  Location: [facility];  Service: Vascular;  Laterality: Left;  Transposition left arm brachiocephalic fistula.   IR FLUORO GUIDE CV LINE RIGHT  02/09/2019  IR FLUORO GUIDE CV LINE RIGHT  03/05/2019   IR US GUIDE VASC ACCESS RIGHT  02/09/2019   MULTIPLE TOOTH EXTRACTIONS     REDUCTION MAMMAPLASTY Bilateral      A IV Location/Drains/Wounds Patient Lines/Drains/Airways Status     Active Line/Drains/Airways     Name Placement date Placement time Site Days   Peripheral IV 07/23/22 20 G Anterior;Proximal;Right Forearm 07/23/22  2200  Forearm  1   Fistula / Graft Left Upper arm  Arteriovenous fistula 02/17/19  1129  Upper arm  1253   External Urinary Catheter 07/24/22  0014  --  less than 1            Intake/Output Last 24 hours No intake or output data in the 24 hours ending 07/24/22 0130  Labs/Imaging Results for orders placed or performed during the hospital encounter of 07/23/22 (from the past 48 hour(s))  POC CBG, ED     Status: Abnormal   Collection Time: 07/23/22 10:39 PM  Result Value Ref Range   Glucose-Capillary 137 (H) 70 - 99 mg/dL    Comment: Glucose reference range applies only to samples taken after fasting for at least 8 hours.  CBC with Differential     Status: Abnormal   Collection Time: 07/23/22 10:44 PM  Result Value Ref Range   WBC 12.6 (H) 4.0 - 10.5 K/uL   RBC 4.29 3.87 - 5.11 MIL/uL   Hemoglobin 12.1 12.0 - 15.0 g/dL   HCT 36.9 36.0 - 46.0 %   MCV 86.0 80.0 - 100.0 fL   MCH 28.2 26.0 - 34.0 pg   MCHC 32.8 30.0 - 36.0 g/dL   RDW 14.4 11.5 - 15.5 %   Platelets 252 150 - 400 K/uL   nRBC 0.0 0.0 - 0.2 %   Neutrophils Relative % 89 %   Neutro Abs 11.3 (H) 1.7 - 7.7 K/uL   Lymphocytes Relative 5 %   Lymphs Abs 0.6 (L) 0.7 - 4.0 K/uL   Monocytes Relative 5 %   Monocytes Absolute 0.6 0.1 - 1.0 K/uL   Eosinophils Relative 0 %   Eosinophils Absolute 0.0 0.0 - 0.5 K/uL   Basophils Relative 0 %   Basophils Absolute 0.0 0.0 - 0.1 K/uL   Immature Granulocytes 1 %   Abs Immature Granulocytes 0.06 0.00 - 0.07 K/uL    Comment: Performed at [facility], [address] [place], [place] [address]  Magnesium     Status: Abnormal   Collection Time: 07/23/22 10:44 PM  Result Value Ref Range   Magnesium 2.6 (H) 1.7 - 2.4 mg/dL    Comment: Performed at [facility] [address], [place], [place] [address]  Troponin I (High Sensitivity)     Status: Abnormal   Collection Time: 07/23/22 10:44 PM  Result Value Ref Range   Troponin I (High Sensitivity) 2,155 (HH) <18 ng/L    Comment: CRITICAL RESULT CALLED TO, READ BACK BY AND  VERIFIED WITH Chandon Lazcano RN 07/24/22 0021 M KOROLESKI (NOTE) Elevated high sensitivity troponin I (hsTnI) values and significant  changes across serial measurements may suggest ACS but many other  chronic and acute conditions are known to elevate hsTnI results.  Refer to the "Links" section for chest pain algorithms and additional  guidance. Performed at [facility], [place] [address], [place], [place] [address]   Comprehensive metabolic panel     Status: Abnormal   Collection Time: 07/23/22 10:44 PM  Result Value Ref Range  Sodium 137 135 - 145 mmol/L   Potassium 4.4 3.5 - 5.1 mmol/L   Chloride 94 (L) 98 - 111 mmol/L   CO2 21 (L) 22 - 32 mmol/L   Glucose, Bld 136 (H) 70 - 99 mg/dL    Comment: Glucose reference range applies only to samples taken after fasting for at least 8 hours.   BUN 64 (H) 8 - 23 mg/dL   Creatinine, Ser 13.99 (H) 0.44 - 1.00 mg/dL   Calcium 9.0 8.9 - 10.3 mg/dL   Total Protein 7.5 6.5 - 8.1 g/dL   Albumin 3.2 (L) 3.5 - 5.0 g/dL   AST 44 (H) 15 - 41 U/L   ALT 19 0 - 44 U/L   Alkaline Phosphatase 57 38 - 126 U/L   Total Bilirubin 0.8 0.3 - 1.2 mg/dL   GFR, Estimated 3 (L) >60 mL/min    Comment: (NOTE) Calculated using the CKD-EPI Creatinine Equation (2021)    Anion gap 22 (H) 5 - 15    Comment: ELECTROLYTES REPEATED TO VERIFY Performed at [facility], [address], [place], [place] [address]   Resp panel by RT-PCR (RSV, Flu A&B, Covid) Anterior Nasal Swab     Status: None   Collection Time: 07/23/22 10:44 PM   Specimen: Anterior Nasal Swab  Result Value Ref Range   SARS Coronavirus 2 by RT PCR NEGATIVE NEGATIVE   Influenza A by PCR NEGATIVE NEGATIVE   Influenza B by PCR NEGATIVE NEGATIVE    Comment: (NOTE) The Xpert Xpress SARS-CoV-2/FLU/RSV plus assay is intended as an aid in the diagnosis of influenza from Nasopharyngeal swab specimens and should not be used as a sole basis for treatment. Nasal washings and aspirates are  unacceptable for Xpert Xpress SARS-CoV-2/FLU/RSV testing.  Fact Sheet for Patients: EntrepreneurPulse.com.au  Fact Sheet for Healthcare Providers: IncredibleEmployment.be  This test is not yet approved or cleared by the [place] FDA and has been authorized for detection and/or diagnosis of SARS-CoV-2 by FDA under an Emergency Use Authorization (EUA). This EUA will remain in effect (meaning this test can be used) for the duration of the COVID-19 declaration under Section 564(b)(1) of the Act, 21 U.S.C. section 360bbb-3(b)(1), unless the authorization is terminated or revoked.     Resp Syncytial Virus by PCR NEGATIVE NEGATIVE    Comment: (NOTE) Fact Sheet for Patients: EntrepreneurPulse.com.au  Fact Sheet for Healthcare Providers: IncredibleEmployment.be  This test is not yet approved or cleared by the [place] FDA and has been authorized for detection and/or diagnosis of SARS-CoV-2 by FDA under an Emergency Use Authorization (EUA). This EUA will remain in effect (meaning this test can be used) for the duration of the COVID-19 declaration under Section 564(b)(1) of the Act, 21 U.S.C. section 360bbb-3(b)(1), unless the authorization is terminated or revoked.  Performed at [facility], [place] [address], [place], [place] [address]   CK     Status: Abnormal   Collection Time: 07/23/22 10:44 PM  Result Value Ref Range   Total CK 1,948 (H) 38 - 234 U/L    Comment: Performed at [facility], [place] [address] [place], [place] [address]  Lactic acid, plasma     Status: None   Collection Time: 07/23/22 10:50 PM  Result Value Ref Range   Lactic Acid, Venous 1.8 0.5 - 1.9 mmol/L    Comment: Performed at [place] [address] [place], [place] [address]  I-stat chem 8, ED (not at [facility], [facility]  or [facility])     Status: Abnormal   Collection Time: 07/23/22 10:52 PM  Result Value Ref Range    Sodium 136 135 - 145 mmol/L   Potassium 4.6 3.5 - 5.1 mmol/L   Chloride 101 98 - 111 mmol/L   BUN 57 (H) 8 - 23 mg/dL   Creatinine, Ser 16.60 (H) 0.44 - 1.00 mg/dL   Glucose, Bld 132 (H) 70 - 99 mg/dL    Comment: Glucose reference range applies only to samples taken after fasting for at least 8 hours.   Calcium, Ion 1.02 (L) 1.15 - 1.40 mmol/L   TCO2 23 22 - 32 mmol/L   Hemoglobin 11.6 (L) 12.0 - 15.0 g/dL   HCT 34.0 (L) 36.0 - 46.0 %   CT Head Wo Contrast  Result Date: 07/23/2022 CLINICAL DATA:  Mechanical fall at home, head and neck trauma. EXAM: CT HEAD WITHOUT CONTRAST CT CERVICAL SPINE WITHOUT CONTRAST TECHNIQUE: Multidetector CT imaging of the head and cervical spine was performed following the standard protocol without intravenous contrast. Multiplanar CT image reconstructions of the cervical spine were also generated. RADIATION DOSE REDUCTION: This exam was performed according to the departmental dose-optimization program which includes automated exposure control, adjustment of the mA and/or kV according to patient size and/or use of iterative reconstruction technique. COMPARISON:  MR head examination dated March 18, 2022 FINDINGS: CT HEAD FINDINGS Brain: No evidence of acute infarction, hemorrhage, hydrocephalus, extra-axial collection or mass lesion/mass effect. Diffuse low-attenuation of the periventricular white matter presumed advanced chronic microvascular ischemic changes. Vascular: No hyperdense vessel or unexpected calcification. Skull: Normal. Negative for fracture or focal lesion. Sinuses/Orbits: No acute finding. Other: None. CT CERVICAL SPINE FINDINGS Alignment: Straightening of the cervical spine. Skull base and vertebrae: No acute fracture. No primary bone lesion or focal pathologic process. Soft tissues and spinal canal: No prevertebral fluid or swelling. No visible canal hematoma. Disc levels: Mild multilevel degenerate disc disease prominent at C5-C6 and C6-C7 with anterior  osteophytes. Upper chest: Right lung apex opacity measuring 0.4 x 0.9 cm partially imaged. Other: None IMPRESSION: CT head: 1. No acute intracranial abnormality. 2. Advanced chronic microvascular ischemic changes of the white matter. CT cervical spine: 1. No evidence of cervical spine fracture or traumatic subluxation. 2. 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 3. Right lung apex opacity measuring 0.9 x 0.5 cm partially imaged, which may represent pneumonia. Follow-up examination to resolution is recommended. Electronically Signed   By: Keane Police D.O.   On: 07/23/2022 23:29   CT Cervical Spine Wo Contrast  Result Date: 07/23/2022 CLINICAL DATA:  Mechanical fall at home, head and neck trauma. EXAM: CT HEAD WITHOUT CONTRAST CT CERVICAL SPINE WITHOUT CONTRAST TECHNIQUE: Multidetector CT imaging of the head and cervical spine was performed following the standard protocol without intravenous contrast. Multiplanar CT image reconstructions of the cervical spine were also generated. RADIATION DOSE REDUCTION: This exam was performed according to the departmental dose-optimization program which includes automated exposure control, adjustment of the mA and/or kV according to patient size and/or use of iterative reconstruction technique. COMPARISON:  MR head examination dated March 18, 2022 FINDINGS: CT HEAD FINDINGS Brain: No evidence of acute infarction, hemorrhage, hydrocephalus, extra-axial collection or mass lesion/mass effect. Diffuse low-attenuation of the periventricular white matter presumed advanced chronic microvascular ischemic changes. Vascular: No hyperdense vessel or unexpected calcification. Skull: Normal. Negative for fracture or focal lesion. Sinuses/Orbits: No acute finding. Other: None. CT CERVICAL SPINE FINDINGS Alignment: Straightening of the cervical spine. Skull  base and vertebrae: No acute fracture. No primary bone lesion or focal pathologic process. Soft tissues and  spinal canal: No prevertebral fluid or swelling. No visible canal hematoma. Disc levels: Mild multilevel degenerate disc disease prominent at C5-C6 and C6-C7 with anterior osteophytes. Upper chest: Right lung apex opacity measuring 0.4 x 0.9 cm partially imaged. Other: None IMPRESSION: CT head: 1. No acute intracranial abnormality. 2. Advanced chronic microvascular ischemic changes of the white matter. CT cervical spine: 1. No evidence of cervical spine fracture or traumatic subluxation. 2. 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 3. Right lung apex opacity measuring 0.9 x 0.5 cm partially imaged, which may represent pneumonia. Follow-up examination to resolution is recommended. Electronically Signed   By: Keane Police D.O.   On: 07/23/2022 23:29   DG Pelvis 1-2 Views  Result Date: 07/23/2022 CLINICAL DATA:  Shortness of breath EXAM: PELVIS - 1-2 VIEW COMPARISON:  03/13/2022. FINDINGS: There is no evidence of pelvic fracture or diastasis. No pelvic bone lesions are seen. Calcified uterine fibroids confirmed by prior CT. IMPRESSION: No acute finding. Electronically Signed   By: Jorje Guild M.D.   On: 07/23/2022 22:06   DG Chest 2 View  Result Date: 07/23/2022 CLINICAL DATA:  Shortness of breath EXAM: CHEST - 2 VIEW COMPARISON:  05/02/2022 FINDINGS: Patchy bilateral airspace disease consistent with pneumonia. Mild cardiac enlargement. No visible effusion or pneumothorax. IMPRESSION: Multifocal pneumonia. Electronically Signed   By: Jorje Guild M.D.   On: 07/23/2022 22:06    Pending Labs Unresulted Labs (From admission, onward)     Start     Ordered   07/25/22 0500  Heparin level (unfractionated)  Daily,   R      07/24/22 0110   07/24/22 1000  Heparin level (unfractionated)  Once-Timed,   URGENT        07/24/22 0110   07/24/22 1000  CBC  Daily,   R      07/24/22 0110   07/24/22 0500  CBC  Daily,   R      07/24/22 0113   07/24/22 0500  Comprehensive metabolic panel   Daily,   R      07/24/22 0113   07/23/22 2351  Blood culture (routine x 2)  BLOOD CULTURE X 2,   R (with STAT occurrences)      07/23/22 2350   07/23/22 2250  Lactic acid, plasma  Now then every 2 hours,   R (with STAT occurrences)      07/23/22 2250            Vitals/Pain Today's Vitals   07/23/22 2352 07/24/22 0000 07/24/22 0029 07/24/22 0030  BP:  105/74 131/82 (!) 151/92  Pulse:  (!) 110 (!) 111 (!) 110  Resp:  (!) 39 (!) 33 (!) 38  Temp:      TempSrc:      SpO2:  100% 97% 98%  Weight:      Height:      PainSc: 5        Isolation Precautions No active isolations  Medications Medications  aspirin tablet 325 mg (has no administration in time range)  nitroGLYCERIN (NITROSTAT) SL tablet 0.4 mg (has no administration in time range)  rosuvastatin (CRESTOR) tablet 5 mg (has no administration in time range)  heparin ADULT infusion 100 units/mL (25000 units/27mL) (1,000 Units/hr Intravenous New Bag/Given 07/24/22 0125)  cefTRIAXone (ROCEPHIN) 1 g in sodium chloride 0.9 % 100 mL IVPB (0 g Intravenous Stopped 07/24/22 0021)  azithromycin (ZITHROMAX) 500 mg in sodium chloride 0.9 % 250 mL IVPB (0 mg Intravenous Stopped 07/24/22 0029)  benzonatate (TESSALON) capsule 200 mg (200 mg Oral Given 07/23/22 2327)  heparin injection 4,000 Units (4,000 Units Intravenous Given 07/24/22 0053)  morphine (PF) 4 MG/ML injection 4 mg (4 mg Intravenous Given 07/24/22 0117)    Mobility walks     Focused Assessments Cardiac Assessment Handoff:    Lab Results  Component Value Date   CKTOTAL 1,948 (H) 07/23/2022   CKMB 3.5 01/19/2012   TROPONINI 0.03 09/22/2015   Lab Results  Component Value Date   DDIMER 2.68 (H) 02/05/2019   Does the Patient currently have chest pain? Yes    R Recommendations: See Admitting Provider Note  Report given to:   Additional Notes: heparin drip running 54morphine given for pain, a and o x 4, cooperative

## 2022-07-24 NOTE — Progress Notes (Signed)
Received patient in bed ,awake,alert and oriented x 2.  Access used:Left upper arm avf,that worked well.  Duration of treatment :3.5 hours  Fluid removed 3,000 cc   Medicine given : None . Reminded patient's nurse about the new dosage of Vancomycin.  Hand off to the patient's nurse.

## 2022-07-24 NOTE — Progress Notes (Signed)
Inpatient Rehab Admissions Coordinator Note:   Per therapy recommendations patient was screened for CIR candidacy by Michel Santee, PT. At this time, pt appears to be a potential candidate for CIR. I will place an order for rehab consult for full assessment, per our protocol.  Please contact me any with questions.Shann Medal, PT, DPT [phone] 07/24/22 4:18 PM

## 2022-07-25 ENCOUNTER — Encounter (HOSPITAL_COMMUNITY)
Admission: EM | Disposition: A | Discharge status: Skilled Nursing Facility | Payer: Self-pay | Source: Home / Self Care | Attending: Family Medicine

## 2022-07-25 DIAGNOSIS — R7989 Other specified abnormal findings of blood chemistry: Secondary | ICD-10-CM | POA: Diagnosis not present

## 2022-07-25 DIAGNOSIS — J189 Pneumonia, unspecified organism: Secondary | ICD-10-CM | POA: Diagnosis not present

## 2022-07-25 DIAGNOSIS — N186 End stage renal disease: Secondary | ICD-10-CM | POA: Diagnosis not present

## 2022-07-25 DIAGNOSIS — I214 Non-ST elevation (NSTEMI) myocardial infarction: Secondary | ICD-10-CM | POA: Diagnosis not present

## 2022-07-25 HISTORY — PX: CORONARY ANGIOGRAPHY: CATH118303

## 2022-07-25 LAB — LIPID PANEL
Cholesterol: 198 mg/dL (ref 0–200)
HDL: 38 mg/dL — ABNORMAL LOW (ref >40)
LDL Cholesterol: 133 mg/dL — ABNORMAL HIGH (ref 0–99)
Total CHOL/HDL Ratio: 5.2 RATIO
Triglycerides: 133 mg/dL (ref <150)
VLDL: 27 mg/dL (ref 0–40)

## 2022-07-25 LAB — MRSA NEXT GEN BY PCR, NASAL: MRSA by PCR Next Gen: NOT DETECTED

## 2022-07-25 LAB — CBC
HCT: 35.3 % — ABNORMAL LOW (ref 36.0–46.0)
Hemoglobin: 11.4 g/dL — ABNORMAL LOW (ref 12.0–15.0)
MCH: 27.8 pg (ref 26.0–34.0)
MCHC: 32.3 g/dL (ref 30.0–36.0)
MCV: 86.1 fL (ref 80.0–100.0)
Platelets: 254 10*3/uL (ref 150–400)
RBC: 4.1 MIL/uL (ref 3.87–5.11)
RDW: 14.6 % (ref 11.5–15.5)
WBC: 7 10*3/uL (ref 4.0–10.5)
nRBC: 0 % (ref 0.0–0.2)

## 2022-07-25 LAB — COMPREHENSIVE METABOLIC PANEL
ALT: 20 U/L (ref 0–44)
AST: 47 U/L — ABNORMAL HIGH (ref 15–41)
Albumin: 2.7 g/dL — ABNORMAL LOW (ref 3.5–5.0)
Alkaline Phosphatase: 49 U/L (ref 38–126)
Anion gap: 17 — ABNORMAL HIGH (ref 5–15)
BUN: 38 mg/dL — ABNORMAL HIGH (ref 8–23)
CO2: 23 mmol/L (ref 22–32)
Calcium: 8.4 mg/dL — ABNORMAL LOW (ref 8.9–10.3)
Chloride: 96 mmol/L — ABNORMAL LOW (ref 98–111)
Creatinine, Ser: 8.52 mg/dL — ABNORMAL HIGH (ref 0.44–1.00)
GFR, Estimated: 5 mL/min — ABNORMAL LOW (ref >60)
Glucose, Bld: 70 mg/dL (ref 70–99)
Potassium: 4 mmol/L (ref 3.5–5.1)
Sodium: 136 mmol/L (ref 135–145)
Total Bilirubin: 0.9 mg/dL (ref 0.3–1.2)
Total Protein: 6.6 g/dL (ref 6.5–8.1)

## 2022-07-25 LAB — HEMOGLOBIN A1C
Hgb A1c MFr Bld: 5.4 % (ref 4.8–5.6)
Mean Plasma Glucose: 108 mg/dL

## 2022-07-25 LAB — HEPARIN LEVEL (UNFRACTIONATED)
Heparin Unfractionated: 0.27 IU/mL — ABNORMAL LOW (ref 0.30–0.70)
Heparin Unfractionated: 0.44 IU/mL (ref 0.30–0.70)

## 2022-07-25 SURGERY — CORONARY ANGIOGRAPHY (CATH LAB)
Anesthesia: LOCAL

## 2022-07-25 MED ORDER — FENTANYL CITRATE (PF) 100 MCG/2ML IJ SOLN
INTRAMUSCULAR | Status: DC | PRN
  Administered 2022-07-25: 25 ug via INTRAVENOUS

## 2022-07-25 MED ORDER — VERAPAMIL HCL 2.5 MG/ML IV SOLN
INTRAVENOUS | Status: AC
  Filled 2022-07-25: qty 2

## 2022-07-25 MED ORDER — HEPARIN SODIUM (PORCINE) 1000 UNIT/ML IJ SOLN
INTRAMUSCULAR | Status: AC
  Filled 2022-07-25: qty 10

## 2022-07-25 MED ORDER — IOHEXOL 350 MG/ML SOLN
INTRAVENOUS | Status: DC | PRN
  Administered 2022-07-25: 55 mL

## 2022-07-25 MED ORDER — CHLORHEXIDINE GLUCONATE CLOTH 2 % EX PADS
6.0000 | MEDICATED_PAD | Freq: Every day | CUTANEOUS | Status: DC
  Administered 2022-07-25 – 2022-07-27 (×3): 6 via TOPICAL

## 2022-07-25 MED ORDER — LIDOCAINE HCL (PF) 1 % IJ SOLN
INTRAMUSCULAR | Status: DC | PRN
  Administered 2022-07-25: 10 mL

## 2022-07-25 MED ORDER — FENTANYL CITRATE (PF) 100 MCG/2ML IJ SOLN
INTRAMUSCULAR | Status: AC
  Filled 2022-07-25: qty 2

## 2022-07-25 MED ORDER — MIDAZOLAM HCL 2 MG/2ML IJ SOLN
INTRAMUSCULAR | Status: DC | PRN
  Administered 2022-07-25: 1 mg via INTRAVENOUS

## 2022-07-25 MED ORDER — LIDOCAINE HCL (PF) 1 % IJ SOLN
INTRAMUSCULAR | Status: AC
  Filled 2022-07-25: qty 30

## 2022-07-25 MED ORDER — HEPARIN (PORCINE) IN NACL 1000-0.9 UT/500ML-% IV SOLN
INTRAVENOUS | Status: DC | PRN
  Administered 2022-07-25 (×2): 500 mL

## 2022-07-25 MED ORDER — SODIUM CHLORIDE 0.9 % IV SOLN
500.0000 mg | INTRAVENOUS | Status: AC
  Administered 2022-07-25: 500 mg via INTRAVENOUS
  Filled 2022-07-25: qty 5

## 2022-07-25 MED ORDER — SODIUM CHLORIDE 0.9 % IV SOLN: INTRAVENOUS | Status: DC

## 2022-07-25 MED ORDER — MIDAZOLAM HCL 2 MG/2ML IJ SOLN
INTRAMUSCULAR | Status: AC
  Filled 2022-07-25: qty 2

## 2022-07-25 SURGICAL SUPPLY — 10 items
CATH INFINITI 5FR MULTPACK ANG (CATHETERS) IMPLANT
CLOSURE MYNX CONTROL 5F (Vascular Products) IMPLANT
KIT HEART LEFT (KITS) ×1 IMPLANT
MAT PREVALON FULL STRYKER (MISCELLANEOUS) IMPLANT
PACK CARDIAC CATHETERIZATION (CUSTOM PROCEDURE TRAY) ×1 IMPLANT
SHEATH PINNACLE 5F 10CM (SHEATH) IMPLANT
SHEATH PROBE COVER 6X72 (BAG) IMPLANT
TRANSDUCER W/STOPCOCK (MISCELLANEOUS) ×1 IMPLANT
TUBING CIL FLEX 10 FLL-RA (TUBING) ×1 IMPLANT
WIRE EMERALD 3MM-J .035X150CM (WIRE) IMPLANT

## 2022-07-25 NOTE — Progress Notes (Addendum)
[facility] Progress Note   Subjective:   Patient seen and examined at bedside.  Reports pleuritic CP this AM and productive cough.  States it has been unchanged since admission.  Appetite decreased and missed a meal due to dialysis yesterday.  Has not had breakfast.  Admits to nausea.  Denies palpitations, SOB, vomiting and diarrhea.  States she tolerated dialysis well yesterday.   Objective Vitals:   07/24/22 1855 07/24/22 2204 07/25/22 0025 07/25/22 0743  BP: 132/78 (!) 142/90 122/65 130/60  Pulse:  (!) 102 91 85  Resp: (!) 22 (!) 24 (!) 22 (!) 26  Temp: (!) 97.5 F (36.4 C) 97.7 F (36.5 C) 98.2 F (36.8 C) 98.1 F (36.7 C)  TempSrc: Oral Oral Oral Oral  SpO2: 98% 100% 100% 100%  Weight:      Height:       Physical Exam General:ill appearing female in AND Heart:RRR, no mrg Lungs:+diffuse rhonchi, nml WOB on RA Abdomen:soft, NTND Extremities:no LE edema Dialysis Access: LU AVF +b/t   Filed Weights   07/23/22 2337 07/24/22 1400 07/24/22 1803  Weight: 95.3 kg 94.6 kg 92.1 kg    Intake/Output Summary (Last 24 hours) at 07/25/2022 1003 Last data filed at 07/25/2022 0300 Gross per 24 hour  Intake 977.55 ml  Output 3000 ml  Net -2022.45 ml    Additional Objective Labs: Basic Metabolic Panel: Recent Labs  Lab 07/23/22 2244 07/23/22 2252 07/24/22 0120 07/24/22 1502 07/25/22 0158  NA 137 136 139  --  136  K 4.4 4.6 4.9  --  4.0  CL 94* 101 96*  --  96*  CO2 21*  --  23  --  23  GLUCOSE 136* 132* 134*  --  70  BUN 64* 57* 64*  --  38*  CREATININE 13.99* 16.60* 14.02*  --  8.52*  CALCIUM 9.0  --  8.8*  --  8.4*  PHOS  --   --   --  6.8*  --    Liver Function Tests: Recent Labs  Lab 07/23/22 2244 07/24/22 0120 07/25/22 0158  AST 44* 48* 47*  ALT 19 17 20   ALKPHOS 57 51 49  BILITOT 0.8 0.6 0.9  PROT 7.5 7.0 6.6  ALBUMIN 3.2* 3.0* 2.7*   CBC: Recent Labs  Lab 07/23/22 2244 07/23/22 2252 07/24/22 0120 07/24/22 1221 07/25/22 0158   WBC 12.6*  --  11.9* 8.9 7.0  NEUTROABS 11.3*  --   --   --   --   HGB 12.1   < > 11.8* 11.0* 11.4*  HCT 36.9   < > 36.1 32.7* 35.3*  MCV 86.0  --  86.4 84.7 86.1  PLT 252  --  260 246 254   < > = values in this interval not displayed.   Blood Culture    Component Value Date/Time   SDES BLOOD SITE NOT SPECIFIED 07/24/2022 0030   SPECREQUEST  07/24/2022 0030    BOTTLES DRAWN AEROBIC AND ANAEROBIC Blood Culture adequate volume   CULT  07/24/2022 0030    NO GROWTH 1 DAY Performed at [facility], [place] [address] [place], [place] [address]    REPTSTATUS PENDING 07/24/2022 0030    Cardiac Enzymes: Recent Labs  Lab 07/23/22 2244  CKTOTAL 1,948*   CBG: Recent Labs  Lab 07/23/22 2239  GLUCAP 137*    Studies/Results: DG Abd 1 View  Result Date: 07/24/2022 CLINICAL DATA:  Epigastric pain over the last 3 days.  Emesis. EXAM:  ABDOMEN - 1 VIEW COMPARISON:  CT 03/13/2022 FINDINGS: Bowel gas pattern does not show evidence of ileus, obstruction or visible free air. Surgical clips present in the right upper quadrant consistent with previous cholecystectomy. 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 Patchy density in the lung bases as seen on yesterday's chest radiography. IMPRESSION: No acute radiographic finding. No evidence of ileus or obstruction. Previous cholecystectomy. 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 Electronically Signed   By: Nelson Chimes M.D.   On: 07/24/2022 09:39   ECHOCARDIOGRAM COMPLETE  Result Date: 07/24/2022    ECHOCARDIOGRAM REPORT   Patient Name:   QUITA BOTTICELLI Date of Exam: 07/24/2022 Medical Rec #:  [id number]    Height:       70.0 in Accession #:    [id number]   Weight:       210.0 lb Date of Birth:  01-Jan-1961    BSA:          2.131 m Patient Age:    23 years     BP:           155/74 mmHg Patient Gender: F            HR:           93 bpm. Exam Location:   Inpatient Procedure: 2D Echo, Cardiac Doppler and Color Doppler Indications:    NSTEMI  History:        Patient has prior history of Echocardiogram examinations.  Sonographer:    NA Referring Phys: [place]  1. Left ventricular ejection fraction, by estimation, is 35 to 40%. The left ventricle has moderately decreased function. The left ventricle demonstrates regional wall motion abnormalities with anteroseptal and peri-apical hypokinesis. There is mild concentric left ventricular hypertrophy. Left ventricular diastolic parameters are consistent with Grade I diastolic dysfunction (impaired relaxation).  2. Right ventricular systolic function is normal. The right ventricular size is normal. There is mildly elevated pulmonary artery systolic pressure. The estimated right ventricular systolic pressure is 0000000 mmHg.  3. Left atrial size was mildly dilated.  4. The mitral valve is normal in structure. Trivial mitral valve regurgitation. Mild mitral stenosis. The mean mitral valve gradient is 5.0 mmHg. Moderate mitral annular calcification.  5. Tricuspid valve regurgitation is mild to moderate.  6. Small (0.9 cm) mobile vegetation attached to the aortic side of the aortic valve near the annulus. This was only seen in the parasternal short axis view. Would consider TEE for full evaluation. The aortic valve is tricuspid. There is mild calcification of the aortic valve. Aortic valve regurgitation is not visualized. No aortic stenosis is present.  7. The inferior vena cava is normal in size with greater than 50% respiratory variability, suggesting right atrial pressure of 3 mmHg. FINDINGS  Left Ventricle: Left ventricular ejection fraction, by estimation, is 35 to 40%. The left ventricle has moderately decreased function. The left ventricle demonstrates regional wall motion abnormalities. The left ventricular internal cavity size was normal in size. There is mild concentric left ventricular  hypertrophy. Left ventricular diastolic parameters are consistent with Grade I diastolic dysfunction (impaired relaxation). Right Ventricle: The right ventricular size is normal. No increase in right ventricular wall thickness. Right ventricular systolic function is normal. There is mildly elevated pulmonary artery systolic pressure. The tricuspid regurgitant velocity is 3.24  m/s, and with an assumed right atrial pressure of 3 mmHg, the estimated right ventricular  systolic pressure is 0000000 mmHg. Left Atrium: Left atrial size was mildly dilated. Right Atrium: Right atrial size was normal in size. Pericardium: Trivial pericardial effusion is present. Mitral Valve: The mitral valve is normal in structure. There is mild calcification of the mitral valve leaflet(s). Moderate mitral annular calcification. Trivial mitral valve regurgitation. Mild mitral valve stenosis. MV peak gradient, 8.8 mmHg. The mean  mitral valve gradient is 5.0 mmHg. Tricuspid Valve: The tricuspid valve is normal in structure. Tricuspid valve regurgitation is mild to moderate. Aortic Valve: Small (0.9 cm) mobile vegetation attached to the aortic side of the aortic valve near the annulus. The aortic valve is tricuspid. There is mild calcification of the aortic valve. Aortic valve regurgitation is not visualized. No aortic stenosis is present. Aortic valve mean gradient measures 2.5 mmHg. Aortic valve peak gradient measures 4.9 mmHg. Aortic valve area, by VTI measures 2.92 cm. Pulmonic Valve: The pulmonic valve was normal in structure. Pulmonic valve regurgitation is not visualized. Aorta: The aortic root is normal in size and structure. Venous: The inferior vena cava is normal in size with greater than 50% respiratory variability, suggesting right atrial pressure of 3 mmHg. IAS/Shunts: No atrial level shunt detected by color flow Doppler.  LEFT VENTRICLE PLAX 2D LVIDd:         4.90 cm   Diastology LVIDs:         3.70 cm   LV e' medial:    3.08 cm/s  LV PW:         1.30 cm   LV E/e' medial:  31.0 LV IVS:        1.40 cm   LV e' lateral:   4.95 cm/s LVOT diam:     2.00 cm   LV E/e' lateral: 19.3 LV SV:         51 LV SV Index:   24 LVOT Area:     3.14 cm  RIGHT VENTRICLE             IVC RV Basal diam:  3.60 cm     IVC diam: 1.30 cm RV S prime:     13.60 cm/s TAPSE (M-mode): 2.1 cm LEFT ATRIUM           Index        RIGHT ATRIUM           Index LA diam:      3.90 cm 1.83 cm/m   RA Area:     13.40 cm LA Vol (A2C): 90.1 ml 42.28 ml/m  RA Volume:   33.05 ml  15.51 ml/m LA Vol (A4C): 51.6 ml 24.21 ml/m  AORTIC VALVE                    PULMONIC VALVE AV Area (Vmax):    2.80 cm     PV Vmax:       0.80 m/s AV Area (Vmean):   2.81 cm     PV Peak grad:  2.5 mmHg AV Area (VTI):     2.92 cm AV Vmax:           111.00 cm/s AV Vmean:          74.450 cm/s AV VTI:            0.173 m AV Peak Grad:      4.9 mmHg AV Mean Grad:      2.5 mmHg LVOT Vmax:         99.00 cm/s LVOT Vmean:  66.500 cm/s LVOT VTI:          0.161 m LVOT/AV VTI ratio: 0.93  AORTA Ao Root diam: 2.70 cm Ao Asc diam:  3.20 cm Ao Arch diam: 2.9 cm MITRAL VALVE                TRICUSPID VALVE MV Area (PHT): 10.11 cm    TR Peak grad:   42.0 mmHg MV Area VTI:   1.97 cm     TR Vmax:        324.00 cm/s MV Peak grad:  8.8 mmHg MV Mean grad:  5.0 mmHg     SHUNTS MV Vmax:       1.48 m/s     Systemic VTI:  0.16 m MV Vmean:      98.1 cm/s    Systemic Diam: 2.00 cm MV Decel Time: 75 msec MV E velocity: 95.60 cm/s MV A velocity: 167.00 cm/s MV E/A ratio:  0.57 Dalton McleanMD Electronically signed by Franki Monte Signature Date/Time: 07/24/2022/8:52:18 AM    Final    CT Head Wo Contrast  Result Date: 07/23/2022 CLINICAL DATA:  Mechanical fall at home, head and neck trauma. EXAM: CT HEAD WITHOUT CONTRAST CT CERVICAL SPINE WITHOUT CONTRAST TECHNIQUE: Multidetector CT imaging of the head and cervical spine was performed following the standard protocol without intravenous contrast. Multiplanar CT image  reconstructions of the cervical spine were also generated. RADIATION DOSE REDUCTION: This exam was performed according to the departmental dose-optimization program which includes automated exposure control, adjustment of the mA and/or kV according to patient size and/or use of iterative reconstruction technique. COMPARISON:  MR head examination dated March 18, 2022 FINDINGS: CT HEAD FINDINGS Brain: No evidence of acute infarction, hemorrhage, hydrocephalus, extra-axial collection or mass lesion/mass effect. Diffuse low-attenuation of the periventricular white matter presumed advanced chronic microvascular ischemic changes. Vascular: No hyperdense vessel or unexpected calcification. Skull: Normal. Negative for fracture or focal lesion. Sinuses/Orbits: No acute finding. Other: None. CT CERVICAL SPINE FINDINGS Alignment: Straightening of the cervical spine. Skull base and vertebrae: No acute fracture. No primary bone lesion or focal pathologic process. Soft tissues and spinal canal: No prevertebral fluid or swelling. No visible canal hematoma. Disc levels: Mild multilevel degenerate disc disease prominent at C5-C6 and C6-C7 with anterior osteophytes. Upper chest: Right lung apex opacity measuring 0.4 x 0.9 cm partially imaged. Other: None IMPRESSION: CT head: 1. No acute intracranial abnormality. 2. Advanced chronic microvascular ischemic changes of the white matter. CT cervical spine: 1. No evidence of cervical spine fracture or traumatic subluxation. 2. 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 3. Right lung apex opacity measuring 0.9 x 0.5 cm partially imaged, which may represent pneumonia. Follow-up examination to resolution is recommended. Electronically Signed   By: Keane Police D.O.   On: 07/23/2022 23:29   CT Cervical Spine Wo Contrast  Result Date: 07/23/2022 CLINICAL DATA:  Mechanical fall at home, head and neck trauma. EXAM: CT HEAD WITHOUT CONTRAST CT CERVICAL SPINE WITHOUT  CONTRAST TECHNIQUE: Multidetector CT imaging of the head and cervical spine was performed following the standard protocol without intravenous contrast. Multiplanar CT image reconstructions of the cervical spine were also generated. RADIATION DOSE REDUCTION: This exam was performed according to the departmental dose-optimization program which includes automated exposure control, adjustment of the mA and/or kV according to patient size and/or use of iterative reconstruction technique. COMPARISON:  MR head examination dated March 18, 2022 FINDINGS: CT HEAD FINDINGS Brain: No evidence of acute infarction,  hemorrhage, hydrocephalus, extra-axial collection or mass lesion/mass effect. Diffuse low-attenuation of the periventricular white matter presumed advanced chronic microvascular ischemic changes. Vascular: No hyperdense vessel or unexpected calcification. Skull: Normal. Negative for fracture or focal lesion. Sinuses/Orbits: No acute finding. Other: None. CT CERVICAL SPINE FINDINGS Alignment: Straightening of the cervical spine. Skull base and vertebrae: No acute fracture. No primary bone lesion or focal pathologic process. Soft tissues and spinal canal: No prevertebral fluid or swelling. No visible canal hematoma. Disc levels: Mild multilevel degenerate disc disease prominent at C5-C6 and C6-C7 with anterior osteophytes. Upper chest: Right lung apex opacity measuring 0.4 x 0.9 cm partially imaged. Other: None IMPRESSION: CT head: 1. No acute intracranial abnormality. 2. Advanced chronic microvascular ischemic changes of the white matter. CT cervical spine: 1. No evidence of cervical spine fracture or traumatic subluxation. 2. 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 3. Right lung apex opacity measuring 0.9 x 0.5 cm partially imaged, which may represent pneumonia. Follow-up examination to resolution is recommended. Electronically Signed   By: Keane Police D.O.   On: 07/23/2022 23:29   DG  Pelvis 1-2 Views  Result Date: 07/23/2022 CLINICAL DATA:  Shortness of breath EXAM: PELVIS - 1-2 VIEW COMPARISON:  03/13/2022. FINDINGS: There is no evidence of pelvic fracture or diastasis. No pelvic bone lesions are seen. Calcified uterine fibroids confirmed by prior CT. IMPRESSION: No acute finding. Electronically Signed   By: Jorje Guild M.D.   On: 07/23/2022 22:06   DG Chest 2 View  Result Date: 07/23/2022 CLINICAL DATA:  Shortness of breath EXAM: CHEST - 2 VIEW COMPARISON:  05/02/2022 FINDINGS: Patchy bilateral airspace disease consistent with pneumonia. Mild cardiac enlargement. No visible effusion or pneumothorax. IMPRESSION: Multifocal pneumonia. Electronically Signed   By: Jorje Guild M.D.   On: 07/23/2022 22:06    Medications:  azithromycin Stopped (07/25/22 0831)   cefTRIAXone (ROCEPHIN)  IV Stopped (07/25/22 0831)   heparin 1,100 Units/hr (07/25/22 0403)   [START ON 07/26/2022] vancomycin      amLODipine  10 mg Oral Daily   aspirin EC  81 mg Oral Daily   atorvastatin  40 mg Oral Daily   [START ON 07/26/2022] cinacalcet  90 mg Oral Q M,W,F-HD   metoprolol tartrate  12.5 mg Oral BID   pantoprazole  40 mg Oral Daily   polyethylene glycol  17 g Oral Daily   sucroferric oxyhydroxide  500 mg Oral TID WC    Dialysis Orders: [facility]  4h  400/500   93kg   2/2 bath  LUA AVF  Heparin 4000+ 2031midrun - last HD 3/15, post wt was 92.9kg - venofer 100mg  tiw IV thru 3/25 - sensipar 90 mg po tiw - mircera 30 mcg IV q 2 wks, last 3/1, due 3/15 but held due to Hb 11.1     Assessment/ Plan: NSTEMI/Acute systolic HF- trop's 0000000, 1948, getting IV heparin. Wall motion consistent with Takotsubo CM. Plan for cardiac cath today, TEE tomorrow. Per cards  Aortic valve vegetation - noted on TTE, plan for TEE tomorrow.  PNA - CXR w/ R sided infiltrates. On ABX.  ESRD - on HD MWF. HD tomorrow per regular schedule.  Will plan to do after TEE.   HTN - BP in goal.  Continue home meds.    Volume - possible edema by CXR vs other, up 2 L. Coughing and lung exam w/ diffuse rhonchi more c/w PNA. Net UF goal 3L yesterday, under dry weight.  UF as tolerated and  lower dry on dc if indicated.  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 Cont sensipar and binders.  SP fall  Jen Mow, PA-C [facility] 07/25/2022,10:03 AM  LOS: 1 day   Pt seen, examined and agree w A/P as above.  Kelly Splinter  MD 07/25/2022, 4:52 PM

## 2022-07-25 NOTE — H&P (View-Only) (Signed)
Rounding Note    Patient Name: [name] [place] Date of Encounter: 07/25/2022  [place] Cardiologist: None   Subjective   Reports chest pain when she coughs.  Inpatient Medications    Scheduled Meds:  amLODipine  10 mg Oral Daily   aspirin EC  81 mg Oral Daily   atorvastatin  40 mg Oral Daily   [START ON 07/26/2022] cinacalcet  90 mg Oral Q M,W,F-HD   metoprolol tartrate  12.5 mg Oral BID   pantoprazole  40 mg Oral Daily   polyethylene glycol  17 g Oral Daily   sucroferric oxyhydroxide  500 mg Oral TID WC   Continuous Infusions:  azithromycin Stopped (07/25/22 0831)   cefTRIAXone (ROCEPHIN)  IV Stopped (07/25/22 0831)   heparin 1,100 Units/hr (07/25/22 0403)   [START ON 07/26/2022] vancomycin     PRN Meds: acetaminophen, nitroGLYCERIN   Vital Signs    Vitals:   07/24/22 1855 07/24/22 2204 07/25/22 0025 07/25/22 0743  BP: 132/78 (!) 142/90 122/65 130/60  Pulse:  (!) 102 91 85  Resp: (!) 22 (!) 24 (!) 22 (!) 26  Temp: (!) 97.5 F (36.4 C) 97.7 F (36.5 C) 98.2 F (36.8 C) 98.1 F (36.7 C)  TempSrc: Oral Oral Oral Oral  SpO2: 98% 100% 100% 100%  Weight:      Height:        Intake/Output Summary (Last 24 hours) at 07/25/2022 0948 Last data filed at 07/25/2022 0300 Gross per 24 hour  Intake 977.55 ml  Output 3000 ml  Net -2022.45 ml      07/24/2022    6:03 PM 07/24/2022    2:00 PM 07/23/2022   11:37 PM  Last 3 Weights  Weight (lbs) 203 lb 0.7 oz 208 lb 8.9 oz 210 lb  Weight (kg) 92.1 kg 94.6 kg 95.255 kg      Telemetry    Normal sinus rhythm- Personally Reviewed  ECG      Physical Exam   GEN: No acute distress.  Easily startled Neck: No JVD Cardiac: RRR, no murmurs, rubs, or gallops.  Respiratory: Clear to auscultation bilaterally. GI: Soft, nontender, non-distended  MS: No edema; No deformity. Neuro:  Nonfocal  Psych: Normal affect   Labs    High Sensitivity Troponin:   Recent Labs  Lab 07/23/22 2244 07/24/22 0120  07/24/22 0733 07/24/22 1221 07/24/22 1502  TROPONINIHS 2,155* 1,948* 6,242* 4,206* 4,276*     Chemistry Recent Labs  Lab 07/23/22 2244 07/23/22 2252 07/24/22 0120 07/25/22 0158  NA 137 136 139 136  K 4.4 4.6 4.9 4.0  CL 94* 101 96* 96*  CO2 21*  --  23 23  GLUCOSE 136* 132* 134* 70  BUN 64* 57* 64* 38*  CREATININE 13.99* 16.60* 14.02* 8.52*  CALCIUM 9.0  --  8.8* 8.4*  MG 2.6*  --   --   --   PROT 7.5  --  7.0 6.6  ALBUMIN 3.2*  --  3.0* 2.7*  AST 44*  --  48* 47*  ALT 19  --  17 20  ALKPHOS 57  --  51 49  BILITOT 0.8  --  0.6 0.9  GFRNONAA 3*  --  3* 5*  ANIONGAP 22*  --  20* 17*    Lipids  Recent Labs  Lab 07/25/22 0158  CHOL 198  TRIG 133  HDL 38*  LDLCALC 133*  CHOLHDL 5.2    Hematology Recent Labs  Lab 07/24/22 0120 07/24/22 1221 07/25/22 0158  WBC 11.9* 8.9 7.0  RBC 4.18 3.86* 4.10  HGB 11.8* 11.0* 11.4*  HCT 36.1 32.7* 35.3*  MCV 86.4 84.7 86.1  MCH 28.2 28.5 27.8  MCHC 32.7 33.6 32.3  RDW 14.3 14.7 14.6  PLT 260 246 254   Thyroid No results for input(s): "TSH", "FREET4" in the last 168 hours.  BNPNo results for input(s): "BNP", "PROBNP" in the last 168 hours.  DDimer No results for input(s): "DDIMER" in the last 168 hours.   Radiology    DG Abd 1 View  Result Date: 07/24/2022 CLINICAL DATA:  Epigastric pain over the last 3 days.  Emesis. EXAM: ABDOMEN - 1 VIEW COMPARISON:  CT 03/13/2022 FINDINGS: Bowel gas pattern does not show evidence of ileus, obstruction or visible free air. Surgical clips present in the right upper quadrant consistent with previous cholecystectomy. 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 Patchy density in the lung bases as seen on yesterday's chest radiography. IMPRESSION: No acute radiographic finding. No evidence of ileus or obstruction. Previous cholecystectomy. 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  Electronically Signed   By: Nelson Chimes M.D.   On: 07/24/2022 09:39   ECHOCARDIOGRAM COMPLETE  Result Date: 07/24/2022    ECHOCARDIOGRAM REPORT   Patient Name:   [name] Date of Exam: 07/24/2022 Medical Rec #:  [id number]    Height:       70.0 in Accession #:    [id number]   Weight:       210.0 lb Date of Birth:  14-Sep-1960    BSA:          2.131 m Patient Age:    62 years     BP:           155/74 mmHg Patient Gender: F            HR:           93 bpm. Exam Location:  Inpatient Procedure: 2D Echo, Cardiac Doppler and Color Doppler Indications:    NSTEMI  History:        Patient has prior history of Echocardiogram examinations.  Sonographer:    NA Referring Phys: [place]  1. Left ventricular ejection fraction, by estimation, is 35 to 40%. The left ventricle has moderately decreased function. The left ventricle demonstrates regional wall motion abnormalities with anteroseptal and peri-apical hypokinesis. There is mild concentric left ventricular hypertrophy. Left ventricular diastolic parameters are consistent with Grade I diastolic dysfunction (impaired relaxation).  2. Right ventricular systolic function is normal. The right ventricular size is normal. There is mildly elevated pulmonary artery systolic pressure. The estimated right ventricular systolic pressure is 0000000 mmHg.  3. Left atrial size was mildly dilated.  4. The mitral valve is normal in structure. Trivial mitral valve regurgitation. Mild mitral stenosis. The mean mitral valve gradient is 5.0 mmHg. Moderate mitral annular calcification.  5. Tricuspid valve regurgitation is mild to moderate.  6. Small (0.9 cm) mobile vegetation attached to the aortic side of the aortic valve near the annulus. This was only seen in the parasternal short axis view. Would consider TEE for full evaluation. The aortic valve is tricuspid. There is mild calcification of the aortic valve. Aortic valve regurgitation is not visualized. No aortic  stenosis is present.  7. The inferior vena cava is normal in size with greater than 50% respiratory variability, suggesting right atrial pressure of 3 mmHg. FINDINGS  Left Ventricle: Left ventricular ejection fraction, by estimation, is 35 to 40%. The left ventricle has moderately decreased function. The left ventricle demonstrates regional wall motion abnormalities. The left ventricular internal cavity size was normal in size. There is mild concentric left ventricular hypertrophy. Left ventricular diastolic parameters are consistent with Grade I diastolic dysfunction (impaired relaxation). Right Ventricle: The right ventricular size is normal. No increase in right ventricular wall thickness. Right ventricular systolic function is normal. There is mildly elevated pulmonary artery systolic pressure. The tricuspid regurgitant velocity is 3.24  m/s, and with an assumed right atrial pressure of 3 mmHg, the estimated right ventricular systolic pressure is 0000000 mmHg. Left Atrium: Left atrial size was mildly dilated. Right Atrium: Right atrial size was normal in size. Pericardium: Trivial pericardial effusion is present. Mitral Valve: The mitral valve is normal in structure. There is mild calcification of the mitral valve leaflet(s). Moderate mitral annular calcification. Trivial mitral valve regurgitation. Mild mitral valve stenosis. MV peak gradient, 8.8 mmHg. The mean  mitral valve gradient is 5.0 mmHg. Tricuspid Valve: The tricuspid valve is normal in structure. Tricuspid valve regurgitation is mild to moderate. Aortic Valve: Small (0.9 cm) mobile vegetation attached to the aortic side of the aortic valve near the annulus. The aortic valve is tricuspid. There is mild calcification of the aortic valve. Aortic valve regurgitation is not visualized. No aortic stenosis is present. Aortic valve mean gradient measures 2.5 mmHg. Aortic valve peak gradient measures 4.9 mmHg. Aortic valve area, by VTI measures 2.92 cm.  Pulmonic Valve: The pulmonic valve was normal in structure. Pulmonic valve regurgitation is not visualized. Aorta: The aortic root is normal in size and structure. Venous: The inferior vena cava is normal in size with greater than 50% respiratory variability, suggesting right atrial pressure of 3 mmHg. IAS/Shunts: No atrial level shunt detected by color flow Doppler.  LEFT VENTRICLE PLAX 2D LVIDd:         4.90 cm   Diastology LVIDs:         3.70 cm   LV e' medial:    3.08 cm/s LV PW:         1.30 cm   LV E/e' medial:  31.0 LV IVS:        1.40 cm   LV e' lateral:   4.95 cm/s LVOT diam:     2.00 cm   LV E/e' lateral: 19.3 LV SV:         51 LV SV Index:   24 LVOT Area:     3.14 cm  RIGHT VENTRICLE             IVC RV Basal diam:  3.60 cm     IVC diam: 1.30 cm RV S prime:     13.60 cm/s TAPSE (M-mode): 2.1 cm LEFT ATRIUM           Index        RIGHT ATRIUM           Index LA diam:      3.90 cm 1.83 cm/m   RA Area:     13.40 cm LA Vol (A2C): 90.1 ml 42.28 ml/m  RA Volume:   33.05 ml  15.51 ml/m LA Vol (A4C): 51.6 ml 24.21 ml/m  AORTIC VALVE                    PULMONIC VALVE AV Area (Vmax):    2.80 cm     PV Vmax:  0.80 m/s AV Area (Vmean):   2.81 cm     PV Peak grad:  2.5 mmHg AV Area (VTI):     2.92 cm AV Vmax:           111.00 cm/s AV Vmean:          74.450 cm/s AV VTI:            0.173 m AV Peak Grad:      4.9 mmHg AV Mean Grad:      2.5 mmHg LVOT Vmax:         99.00 cm/s LVOT Vmean:        66.500 cm/s LVOT VTI:          0.161 m LVOT/AV VTI ratio: 0.93  AORTA Ao Root diam: 2.70 cm Ao Asc diam:  3.20 cm Ao Arch diam: 2.9 cm MITRAL VALVE                TRICUSPID VALVE MV Area (PHT): 10.11 cm    TR Peak grad:   42.0 mmHg MV Area VTI:   1.97 cm     TR Vmax:        324.00 cm/s MV Peak grad:  8.8 mmHg MV Mean grad:  5.0 mmHg     SHUNTS MV Vmax:       1.48 m/s     Systemic VTI:  0.16 m MV Vmean:      98.1 cm/s    Systemic Diam: 2.00 cm MV Decel Time: 75 msec MV E velocity: 95.60 cm/s MV A velocity: 167.00 cm/s  MV E/A ratio:  0.57 Dalton McleanMD Electronically signed by Franki Monte Signature Date/Time: 07/24/2022/8:52:18 AM    Final    CT Head Wo Contrast  Result Date: 07/23/2022 CLINICAL DATA:  Mechanical fall at home, head and neck trauma. EXAM: CT HEAD WITHOUT CONTRAST CT CERVICAL SPINE WITHOUT CONTRAST TECHNIQUE: Multidetector CT imaging of the head and cervical spine was performed following the standard protocol without intravenous contrast. Multiplanar CT image reconstructions of the cervical spine were also generated. RADIATION DOSE REDUCTION: This exam was performed according to the departmental dose-optimization program which includes automated exposure control, adjustment of the mA and/or kV according to patient size and/or use of iterative reconstruction technique. COMPARISON:  MR head examination dated March 18, 2022 FINDINGS: CT HEAD FINDINGS Brain: No evidence of acute infarction, hemorrhage, hydrocephalus, extra-axial collection or mass lesion/mass effect. Diffuse low-attenuation of the periventricular white matter presumed advanced chronic microvascular ischemic changes. Vascular: No hyperdense vessel or unexpected calcification. Skull: Normal. Negative for fracture or focal lesion. Sinuses/Orbits: No acute finding. Other: None. CT CERVICAL SPINE FINDINGS Alignment: Straightening of the cervical spine. Skull base and vertebrae: No acute fracture. No primary bone lesion or focal pathologic process. Soft tissues and spinal canal: No prevertebral fluid or swelling. No visible canal hematoma. Disc levels: Mild multilevel degenerate disc disease prominent at C5-C6 and C6-C7 with anterior osteophytes. Upper chest: Right lung apex opacity measuring 0.4 x 0.9 cm partially imaged. Other: None IMPRESSION: CT head: 1. No acute intracranial abnormality. 2. Advanced chronic microvascular ischemic changes of the white matter. CT cervical spine: 1. No evidence of cervical spine fracture or traumatic  subluxation. 2. 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 3. Right lung apex opacity measuring 0.9 x 0.5 cm partially imaged, which may represent pneumonia. Follow-up examination to resolution is recommended. Electronically Signed   By: Keane Police D.O.   On: 07/23/2022 23:29   CT Cervical  Spine Wo Contrast  Result Date: 07/23/2022 CLINICAL DATA:  Mechanical fall at home, head and neck trauma. EXAM: CT HEAD WITHOUT CONTRAST CT CERVICAL SPINE WITHOUT CONTRAST TECHNIQUE: Multidetector CT imaging of the head and cervical spine was performed following the standard protocol without intravenous contrast. Multiplanar CT image reconstructions of the cervical spine were also generated. RADIATION DOSE REDUCTION: This exam was performed according to the departmental dose-optimization program which includes automated exposure control, adjustment of the mA and/or kV according to patient size and/or use of iterative reconstruction technique. COMPARISON:  MR head examination dated March 18, 2022 FINDINGS: CT HEAD FINDINGS Brain: No evidence of acute infarction, hemorrhage, hydrocephalus, extra-axial collection or mass lesion/mass effect. Diffuse low-attenuation of the periventricular white matter presumed advanced chronic microvascular ischemic changes. Vascular: No hyperdense vessel or unexpected calcification. Skull: Normal. Negative for fracture or focal lesion. Sinuses/Orbits: No acute finding. Other: None. CT CERVICAL SPINE FINDINGS Alignment: Straightening of the cervical spine. Skull base and vertebrae: No acute fracture. No primary bone lesion or focal pathologic process. Soft tissues and spinal canal: No prevertebral fluid or swelling. No visible canal hematoma. Disc levels: Mild multilevel degenerate disc disease prominent at C5-C6 and C6-C7 with anterior osteophytes. Upper chest: Right lung apex opacity measuring 0.4 x 0.9 cm partially imaged. Other: None IMPRESSION: CT head: 1. No acute  intracranial abnormality. 2. Advanced chronic microvascular ischemic changes of the white matter. CT cervical spine: 1. No evidence of cervical spine fracture or traumatic subluxation. 2. 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 3. Right lung apex opacity measuring 0.9 x 0.5 cm partially imaged, which may represent pneumonia. Follow-up examination to resolution is recommended. Electronically Signed   By: Keane Police D.O.   On: 07/23/2022 23:29   DG Pelvis 1-2 Views  Result Date: 07/23/2022 CLINICAL DATA:  Shortness of breath EXAM: PELVIS - 1-2 VIEW COMPARISON:  03/13/2022. FINDINGS: There is no evidence of pelvic fracture or diastasis. No pelvic bone lesions are seen. Calcified uterine fibroids confirmed by prior CT. IMPRESSION: No acute finding. Electronically Signed   By: Jorje Guild M.D.   On: 07/23/2022 22:06   DG Chest 2 View  Result Date: 07/23/2022 CLINICAL DATA:  Shortness of breath EXAM: CHEST - 2 VIEW COMPARISON:  05/02/2022 FINDINGS: Patchy bilateral airspace disease consistent with pneumonia. Mild cardiac enlargement. No visible effusion or pneumothorax. IMPRESSION: Multifocal pneumonia. Electronically Signed   By: Jorje Guild M.D.   On: 07/23/2022 22:06    Cardiac Studies   Echocardiogram showing apical hypokinesis and decreased LV function,, question of aortic valve vegetation  Patient Profile     62 y.o. female with ESRD, elevated troponin and decreased LV function  Assessment & Plan    Acute systolic heart failure: Wall motion abnormality may be consistent with Takotsubo cardiomyopathy.  She had a fall and was down on the ground for 12 hours.  Will plan for cardiac cath today.  Patient is n.p.o. I discussed this with the patient and with the patient's daughter.  Would not try to cross the aortic valve aggressively due to the question of aortic valve vegetation.  Will plan for TEE tomorrow.  This was also discussed with the patient's daughter.  All  questions about the cardiac catheterization were answered.  ESRD: Dialysis tomorrow.  Pneumonia: On antibiotics.  Vancomycin was added to cover MRSA and for potential endocarditis.  Blood cultures pending.  Apparently blood cultures were drawn shortly after antibiotics were given.       For  questions or updates, please contact [place] Please consult [email or web address] for contact info under        Signed, Larae Grooms, MD  07/25/2022, 9:48 AM

## 2022-07-25 NOTE — Progress Notes (Signed)
Daily Progress Note Intern Pager: [phone]  Patient name: [name] Medical record number: [id number] Date of birth: 01-15-1961 Age: 62 y.o. Gender: female  Primary Care Provider: Lowry Ram, MD Consultants: Cardiology Code Status: Full Code  Pt Overview and Major Events to Date:  -Admitted 03/20/2   Assessment and Plan: [name] is a 62 y.o. female admitted for NSTEMI in the setting of multifocal pneumonia. Pertinent PMH/PSH includes ESRD on HD MWF, HTN, DM, OSA, HLD, Hx CVA.   * NSTEMI (non-ST elevated myocardial infarction) (HCC) VSS. Troponin down-trending. Cardiology doing cardiac catheterization today, TEE tomorrow. -Consult Cardiology, appreciate recs -Heparin per pharmacy -ASA 325 mg -Nitroglycerin 0.4 mg sublingual prn -Vitals per unit -Cardiac Telemetry  -PT/OT eval and treat -Continue Metoprolol -NPO at midnight for TEE tomorrow  Multifocal pneumonia Continue to be afebrile, and remains stable on RA with mild tachypnea. WBC 7.0. Continue treatment of CAP, will follow blood cultures for concern for endocarditis. 1/4 Bcx growing gram positive rod, likely contaminant, but covered by antibiotic regimen. -Continue Ceftriaxone -Continue Azithromycin -Vancomycin per pharmacy consult -Continue Bcx f/u -AM CBC, BMP   Unwitnessed fall Likely multifactorial in the setting of sepsis, and uremia.  -Will treat sepsis and uremia as above -Consider orthostatics  AMS (altered mental status) Improved with dialysis. -Delirium precautions   -Scheduled HD -Pneumonia treatment as above  ESRD on dialysis ([place]) HD on MWF. -Nephrology consulted recs, appreciated -Electrolyte management per dialysis  -Velphoro, Sensipar  Aneurysm of left renal artery ([place]) -Seen on KUB  Type 2 diabetes mellitus without complications ([place]) Hx of DM per chart review, last A1c 5.3 03/13/22. Patient not on DM medications -CBG checks w/ BMP    FEN/GI: NPO, pending  possibility of cath today PPx: Heparin Dispo:Pending clinical improvement  Subjective:  NAEO. Patient states she is doing unwell due to her "cold". Cannot specify specific symptoms other than cough. Still has abdominal pain. Would like a diet.   Objective: Temp:  [97.1 F (36.2 C)-98.2 F (36.8 C)] 97.8 F (36.6 C) (03/21 1146) Pulse Rate:  [60-102] 83 (03/21 1146) Resp:  [17-33] 17 (03/21 1146) BP: (122-158)/(60-90) 130/76 (03/21 1146) SpO2:  [71 %-100 %] 100 % (03/21 1146) Weight:  [92.1 kg-94.6 kg] 92.1 kg (03/20 1803) Physical Exam: General: NAD, lying comfortably in hospital bed, able to pull herself to sitting. Cardiovascular: RRR, no murmurs, no peripheral edema Respiratory: normal WOB on RA, coarse rhonchi bilateral lung fields Abdomen: soft, diffuse tenderness to palpation, no rebound or guarding Extremities: Moving all 4 extremities equally   Laboratory: Most recent CBC Lab Results  Component Value Date   WBC 7.0 07/25/2022   HGB 11.4 (L) 07/25/2022   HCT 35.3 (L) 07/25/2022   MCV 86.1 07/25/2022   PLT 254 07/25/2022   Most recent BMP    Latest Ref Rng & Units 07/25/2022    1:58 AM  BMP  Glucose 70 - 99 mg/dL 70   BUN 8 - 23 mg/dL 38   Creatinine 0.44 - 1.00 mg/dL 8.52   Sodium 135 - 145 mmol/L 136   Potassium 3.5 - 5.1 mmol/L 4.0   Chloride 98 - 111 mmol/L 96   CO2 22 - 32 mmol/L 23   Calcium 8.9 - 10.3 mg/dL 8.4     Other pertinent labs: Heparin 0.27   Imaging/Diagnostic Tests: No new imaging.  Salvadore Oxford, MD 07/25/2022, 12:45 PM  PGY-1, [place] Intern pager: [phone], text pages welcome Secure chat  group [facility] Teaching Service

## 2022-07-25 NOTE — Progress Notes (Signed)
PHARMACY - PHYSICIAN COMMUNICATION CRITICAL VALUE ALERT - BLOOD CULTURE IDENTIFICATION (BCID)  [name] is an 62 y.o. female who presented to [facility] on 07/23/2022 with a chief complaint of chest pain  Assessment:  One blood culture positive for gram positive rods.  BCID not performed.  Possible contamination  Name of physician (or Provider) Contacted: Horton Chin PharmD who will let MD know  Current antibiotics: Ceftriaxone/azithromycin/vancomycin  Changes to prescribed antibiotics recommended: No changes needed   No results found for this or any previous visit.  [name] 07/25/2022  1:26 PM

## 2022-07-25 NOTE — Progress Notes (Signed)
ANTICOAGULATION CONSULT NOTE - Follow Up Consult  Pharmacy Consult for Heparin Indication: chest pain/ACS  Allergies  Allergen Reactions   Lisinopril Anaphylaxis and Other (See Comments)    angioedema   Venofer  [Ferric Oxide]     Other reaction(s): Back Pain   Camellia Swelling and Other (See Comments)    Angioedema    Jardiance [Empagliflozin] Swelling and Rash    Patient Measurements: Height: 5\' 10"  (177.8 cm) Weight: 92.1 kg (203 lb 0.7 oz) IBW/kg (Calculated) : 68.5 Heparin Dosing Weight: 88 kg  Vital Signs: Temp: 98.2 F (36.8 C) (03/21 0025) Temp Source: Oral (03/21 0025) BP: 122/65 (03/21 0025) Pulse Rate: 91 (03/21 0025)  Labs: Recent Labs    07/23/22 2244 07/23/22 2252 07/24/22 0120 07/24/22 0733 07/24/22 1221 07/24/22 1502 07/24/22 1953 07/25/22 0158  HGB 12.1 11.6* 11.8*  --  11.0*  --   --  11.4*  HCT 36.9 34.0* 36.1  --  32.7*  --   --  35.3*  PLT 252  --  260  --  246  --   --  254  HEPARINUNFRC  --   --   --   --  0.32  --  0.34 0.27*  CREATININE 13.99* 16.60* 14.02*  --   --   --   --  8.52*  CKTOTAL 1,948*  --   --   --   --   --   --   --   TROPONINIHS 2,155*  --  1,948* 6,242* 4,206* 4,276*  --   --      Estimated Creatinine Clearance: 8.3 mL/min (A) (by C-G formula based on SCr of 8.52 mg/dL (H)).   Medications:  Scheduled:   amLODipine  10 mg Oral Daily   aspirin EC  81 mg Oral Daily   atorvastatin  40 mg Oral Daily   Chlorhexidine Gluconate Cloth  6 each Topical Q0600   [START ON 07/26/2022] cinacalcet  90 mg Oral Q M,W,F-HD   metoprolol tartrate  12.5 mg Oral BID   pantoprazole  40 mg Oral Daily   polyethylene glycol  17 g Oral Daily   sucroferric oxyhydroxide  500 mg Oral TID WC   Infusions:   azithromycin 500 mg (07/24/22 2217)   cefTRIAXone (ROCEPHIN)  IV 1 g (07/24/22 2221)   heparin 1,000 Units/hr (07/25/22 0247)   [START ON 07/26/2022] vancomycin      Assessment: 62 yo F continues on heparin for NSTEMI.  Heparin  level at lower end of goal on 1000 units/hr.  Anticipate heparin will accumulate some based on ESRD therefore will not increase rate at this time.  Cardiology following for possible cardiac cath.    3/21 AM update:  Heparin level sub-therapeutic   Goal of Therapy:  Heparin level 0.3-0.7 units/ml Monitor platelets by anticoagulation protocol: Yes   Plan:  Inc heparin to 1100 units/hr 1200 heparin level  Narda Bonds, PharmD, BCPS Clinical Pharmacist Phone: [phone]

## 2022-07-25 NOTE — Progress Notes (Signed)
ANTICOAGULATION CONSULT NOTE - Follow Up Consult  Pharmacy Consult for Heparin Indication: chest pain/ACS  Allergies  Allergen Reactions   Lisinopril Anaphylaxis and Other (See Comments)    angioedema   Venofer  [Ferric Oxide]     Other reaction(s): Back Pain   Camellia Swelling and Other (See Comments)    Angioedema    Jardiance [Empagliflozin] Swelling and Rash    Patient Measurements: Height: 5\' 10"  (177.8 cm) Weight: 92.1 kg (203 lb 0.7 oz) IBW/kg (Calculated) : 68.5 Heparin Dosing Weight: 88 kg  Vital Signs: Temp: 97.8 F (36.6 C) (03/21 1146) Temp Source: Oral (03/21 1146) BP: 130/76 (03/21 1146) Pulse Rate: 83 (03/21 1146)  Labs: Recent Labs    07/23/22 2244 07/23/22 2252 07/24/22 0120 07/24/22 0120 07/24/22 0733 07/24/22 1221 07/24/22 1502 07/24/22 1953 07/25/22 0158 07/25/22 1141  HGB 12.1 11.6* 11.8*  --   --  11.0*  --   --  11.4*  --   HCT 36.9 34.0* 36.1  --   --  32.7*  --   --  35.3*  --   PLT 252  --  260  --   --  246  --   --  254  --   HEPARINUNFRC  --   --   --    < >  --  0.32  --  0.34 0.27* 0.44  CREATININE 13.99* 16.60* 14.02*  --   --   --   --   --  8.52*  --   CKTOTAL 1,948*  --   --   --   --   --   --   --   --   --   TROPONINIHS 2,155*  --  1,948*  --  6,242* 4,206* 4,276*  --   --   --    < > = values in this interval not displayed.     Estimated Creatinine Clearance: 8.3 mL/min (A) (by C-G formula based on SCr of 8.52 mg/dL (H)).   Medications:  Scheduled:   [MAR Hold] amLODipine  10 mg Oral Daily   [MAR Hold] aspirin EC  81 mg Oral Daily   [MAR Hold] atorvastatin  40 mg Oral Daily   [MAR Hold] Chlorhexidine Gluconate Cloth  6 each Topical Q0600   [MAR Hold] cinacalcet  90 mg Oral Q M,W,F-HD   [MAR Hold] metoprolol tartrate  12.5 mg Oral BID   [MAR Hold] pantoprazole  40 mg Oral Daily   [MAR Hold] polyethylene glycol  17 g Oral Daily   [MAR Hold] sucroferric oxyhydroxide  500 mg Oral TID WC   Infusions:   [MAR Hold]  azithromycin Stopped (07/25/22 0831)   [MAR Hold] cefTRIAXone (ROCEPHIN)  IV Stopped (07/25/22 0831)   heparin 1,100 Units/hr (07/25/22 1204)   [MAR Hold] vancomycin      Assessment: 62 yo F continues on heparin for NSTEMI.  Heparin level now within goal on 1100 units/hr.  Patient currently in the cath lab.  Will follow-up after procedure.  Goal of Therapy:  Heparin level 0.3-0.7 units/ml Monitor platelets by anticoagulation protocol: Yes   Plan:  Continue heparin at 1100 units/hr Follow-up after cath  [facility], Pharm.D., BCPS Clinical Pharmacist Clinical phone for 07/25/2022 from 7:30-3:00 is [phone].  **Pharmacist phone directory can be found on [email or web address] listed under [email or web address].  07/25/2022 2:06 PM

## 2022-07-25 NOTE — Consult Note (Addendum)
Physical Medicine and Rehabilitation Consult Reason for Consult:Possible rehab admission and to help from rehab standpoint Referring Physician: Dr Ardelia Mems   HPI: [name] is a 62 y.o. female  With hx of ESRD on HD, HTN, DM, OSA, HLD and hx of CVA who came in with chest pain and was on floor from fall for 12 hours; and admitted with NSTEMI and found also to have multifocal pneumonia- base don TTE, found to have possible aortic valve vegetation and ECHO c/w Takosubo Cardiomyopathy. Initially Cardiology didn't think cardiac cath needed, however with increase of Troponin, decided to do Cardiac catheterization today.  She also was started on IV ABX for PNA- Vanc, Rocephin and Zithromax- and also covered for endocarditis since there was a concern with TTE results- Has TEE tomorrow to determine if vegetation vs calcification. Pt reports still having pleuritic chest pain from coughing; also c/o thick yellow sputum- using Purewick for voiding.  Also been having a lot of nausea since admitted to hospital, however no vomiting.  Denies any other pain except L foot which hurts diffusely from fall just prior to admission.   Pt reports LBM yesterday.       Review of Systems  Constitutional:  Positive for chills and malaise/fatigue. Negative for weight loss.  HENT:  Positive for sinus pain and sore throat.   Eyes: Negative.   Respiratory:  Positive for cough, sputum production, shortness of breath and wheezing.   Cardiovascular:  Negative for orthopnea and claudication.       Pleuritic chest pain  Gastrointestinal:  Positive for nausea. Negative for constipation and vomiting.  Genitourinary:  Negative for dysuria and urgency.  Musculoskeletal:  Positive for joint pain. Negative for myalgias.       L foot  Skin: Negative.   Neurological:  Positive for weakness. Negative for tingling, sensory change, focal weakness and headaches.  Endo/Heme/Allergies: Negative.   Psychiatric/Behavioral:   Negative for depression, hallucinations and suicidal ideas. The patient is not nervous/anxious.   All other systems reviewed and are negative.  Past Medical History:  Diagnosis Date   Accelerated hypertension    Aortic atherosclerosis (HCC)    Arthritis of knee    bilateral   Diabetes mellitus    type 2 - no meds   Diabetic gastroparesis ([place])    Diverticulosis    ESRD (end stage renal disease) ([place])    Fibroid uterus    GERD (gastroesophageal reflux disease)    Hyperlipidemia    Hypertension    Hypertensive emergency 02/05/2019   Hypokalemia    IDA (iron deficiency anemia)    Nausea    Nausea and vomiting in adult 03/13/2022   Paroxysmal ventricular tachycardia (HCC)    Renal disorder    Umbilical hernia    Wears dentures    Past Surgical History:  Procedure Laterality Date   AV FISTULA PLACEMENT Left 02/17/2019   Procedure: ARTERIOVENOUS (AV) FISTULA CREATION LEFT UPPER ARM;  Surgeon: Waynetta Sandy, MD;  Location: [facility];  Service: Vascular;  Laterality: Left;   CATARACT EXTRACTION     right eye   CESAREAN SECTION     x2   CHOLECYSTECTOMY     laparoscopic   FISTULA SUPERFICIALIZATION Left 04/06/2019   Procedure: FISTULA SUPERFICIALIZATION LEFT BRACHIOCEPHALIC;  Surgeon: Waynetta Sandy, MD;  Location: [facility];  Service: Vascular;  Laterality: Left;  Transposition left arm brachiocephalic fistula.   IR FLUORO GUIDE CV LINE RIGHT  02/09/2019   IR FLUORO GUIDE  CV LINE RIGHT  03/05/2019   IR US GUIDE VASC ACCESS RIGHT  02/09/2019   MULTIPLE TOOTH EXTRACTIONS     REDUCTION MAMMAPLASTY Bilateral    Family History  Problem Relation Age of Onset   Hypertension Mother    Diabetes Mother    Cancer Father        lung   Colon cancer Neg Hx    Stomach cancer Neg Hx    Esophageal cancer Neg Hx    Social History:  reports that she quit smoking about 10 years ago. Her smoking use included cigarettes. She has been exposed to tobacco smoke. She has never used  smokeless tobacco. She reports that she does not drink alcohol and does not use drugs. Allergies:  Allergies  Allergen Reactions   Lisinopril Anaphylaxis and Other (See Comments)    angioedema   Venofer  [Ferric Oxide]     Other reaction(s): Back Pain   Camellia Swelling and Other (See Comments)    Angioedema    Jardiance [Empagliflozin] Swelling and Rash   Medications Prior to Admission  Medication Sig Dispense Refill   acetaminophen (TYLENOL) 500 MG tablet Take 1,000 mg by mouth daily as needed for moderate pain.     amLODipine (NORVASC) 10 MG tablet Take 10 mg by mouth daily.     aspirin 325 MG tablet Take 325 mg by mouth daily.     fluticasone (FLONASE) 50 MCG/ACT nasal spray Place 2 sprays into both nostrils daily. (Patient taking differently: Place 2 sprays into both nostrils daily as needed for allergies.) 16 g 6   lactulose (CHRONULAC) 10 GM/15ML solution Take 10 g by mouth daily as needed for mild constipation.     metoCLOPramide (REGLAN) 10 MG tablet Take 1 tablet (10 mg total) by mouth 4 (four) times daily -  before meals and at bedtime. Take 1/2 to 1 tablet prn 30 tablet 1   Multiple Vitamin (DAILY-VITE) TABS Take 1 tablet by mouth daily.     pantoprazole (PROTONIX) 40 MG tablet Take 1 tablet (40 mg total) by mouth daily. 90 tablet 3   polyethylene glycol (MIRALAX / GLYCOLAX) 17 g packet Take 17 g by mouth daily. 14 each 0   sucroferric oxyhydroxide (VELPHORO) 500 MG chewable tablet Chew 500 mg by mouth 3 (three) times daily with meals.     traMADol (ULTRAM) 50 MG tablet Take 50 mg by mouth in the morning, at noon, in the evening, and at bedtime.     Accu-Chek Softclix Lancets lancets Use as instructed (Patient taking differently: 1 each by Other route See admin instructions. Use as instructed) 100 each 12   Blood Glucose Monitoring Suppl (ACCU-CHEK AVIVA PLUS) w/Device KIT Use to check sugar three times a day (Patient taking differently: 1 each by Other route in the morning, at  noon, and at bedtime.) 1 kit 0   glucose blood (ACCU-CHEK AVIVA PLUS) test strip Use as instructed (Patient taking differently: 1 each by Other route See admin instructions. Use as instructed) 100 each 12   Lancets (ACCU-CHEK SOFT TOUCH) lancets Use to check sugars three times a day (Patient taking differently: 1 each by Other route in the morning, at noon, and at bedtime.) 100 each 12   rosuvastatin (CRESTOR) 5 MG tablet Take 1 tablet (5 mg total) by mouth daily. (Patient not taking: Reported on 07/24/2022) 30 tablet 2    Home: [place] expects to be discharged to:: Private residence Living Arrangements: Children Available Help at Discharge: Family,  Available PRN/intermittently Type of Home: Apartment Home Access: Stairs to enter Entrance Stairs-Number of Steps: flight Entrance Stairs-Rails: Right Home Layout: One level Bathroom Shower/Tub: Chiropodist: Standard Home Equipment: None  Functional History: Prior Function Prior Level of Function : Independent/Modified Independent Mobility Comments: ambulatory without DME, 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 Functional Status:  Mobility: Bed Mobility Overal bed mobility: Needs Assistance Bed Mobility: Supine to Sit, Sit to Supine Supine to sit: Mod assist, HOB elevated Sit to supine: Mod assist, HOB elevated General bed mobility comments: assist to advance BLE and powerup trunk. Multimodal cues needed. Decreased inititation. Difficulty sequencing. Transfers Overall transfer level: Needs assistance Equipment used: Rolling walker (2 wheels) Transfers: Sit to/from Stand Sit to Stand: Mod assist General transfer comment: not assessed 2/2 dizziness EOB which did not resolve with time. LOB posteriorly sitting. up to mod A for static sitting balance. Ambulation/Gait Ambulation/Gait assistance:  (transport arrived for HDU, limiting evaluation)    ADL: ADL Overall ADL's : Needs  assistance/impaired Eating/Feeding: NPO Grooming: Set up, Bed level Upper Body Bathing: Sitting, Maximal assistance Lower Body Bathing: Maximal assistance, Sitting/lateral leans, Bed level Upper Body Dressing : Moderate assistance, Sitting Lower Body Dressing: Maximal assistance, Sitting/lateral leans, Bed level General ADL Comments: Pt limited by onset of dizziness EOB. Mod A for static sitting balance. Posterior lean and LOB.  Cognition: Cognition Overall Cognitive Status: Impaired/Different from baseline Orientation Level: Oriented X4 Cognition Arousal/Alertness: Awake/alert Behavior During Therapy: Flat affect, Anxious Overall Cognitive Status: Impaired/Different from baseline Area of Impairment: Following commands, Attention, Awareness, Problem solving Current Attention Level: Focused Following Commands: Follows one step commands with increased time Awareness: Intellectual Problem Solving: Slow processing, Decreased initiation, Difficulty sequencing, Requires verbal cues, Requires tactile cues General Comments: Pt needs multimodal cues for bed mobility. Decreased initiation. Pt with LOB posteriorly sitting EOB, delayed awareness of this by pt. Minimal verbalizations throughout session. Flat affect. Internally distracted? Pt c/o dizziness sitting EOB. VSS.  Blood pressure 130/76, pulse 83, temperature 97.8 F (36.6 C), temperature source Oral, resp. rate 17, height 5\' 10"  (1.778 m), weight 92.1 kg, SpO2 100 %. Physical Exam  Pt's sats 93-100%- heart rate 80s-90s- and RR is 25-30 on tele General- sitting up in bed; still coughing and sneezing a lot- sister at bedside; No acute distress HEENT: some nasal/nare swelling- like some allergic response CV: RRR with an occasional extra beat- fistula in LUE- IV in R upper arm- looks OK Pulm: sounds junky in upper lobes- a lot of rhonchi/wheezing- with productive thick sounding cough/yellow sputum- decreased air movement in lower lobes-  L>>R GI- soft, NT, ND, slightly hypoactive BS GU- has purewick in place- just emptied MS: 5/5 in Ue's in biceps, triceps, WE, grip and FA B/L  5-/5 in HF, KE, KF, DF and PF B/L- however a lot of pain with palpation of dorsum of L foot- not point tender/specific to a specific location Neuro: decreased to light touch in LE's below knees- chronic Skin: some venous stasis changes in LE's from mid calf downwards B/L   Results for orders placed or performed during the hospital encounter of 07/23/22 (from the past 24 hour(s))  Troponin I (High Sensitivity)     Status: Abnormal   Collection Time: 07/24/22  3:02 PM  Result Value Ref Range   Troponin I (High Sensitivity) 4,276 (HH) <18 ng/L  Phosphorus     Status: Abnormal   Collection Time: 07/24/22  3:02 PM  Result Value Ref Range   Phosphorus  6.8 (H) 2.5 - 4.6 mg/dL  Heparin level (unfractionated)     Status: None   Collection Time: 07/24/22  7:53 PM  Result Value Ref Range   Heparin Unfractionated 0.34 0.30 - 0.70 IU/mL  Heparin level (unfractionated)     Status: Abnormal   Collection Time: 07/25/22  1:58 AM  Result Value Ref Range   Heparin Unfractionated 0.27 (L) 0.30 - 0.70 IU/mL  CBC     Status: Abnormal   Collection Time: 07/25/22  1:58 AM  Result Value Ref Range   WBC 7.0 4.0 - 10.5 K/uL   RBC 4.10 3.87 - 5.11 MIL/uL   Hemoglobin 11.4 (L) 12.0 - 15.0 g/dL   HCT 35.3 (L) 36.0 - 46.0 %   MCV 86.1 80.0 - 100.0 fL   MCH 27.8 26.0 - 34.0 pg   MCHC 32.3 30.0 - 36.0 g/dL   RDW 14.6 11.5 - 15.5 %   Platelets 254 150 - 400 K/uL   nRBC 0.0 0.0 - 0.2 %  Comprehensive metabolic panel     Status: Abnormal   Collection Time: 07/25/22  1:58 AM  Result Value Ref Range   Sodium 136 135 - 145 mmol/L   Potassium 4.0 3.5 - 5.1 mmol/L   Chloride 96 (L) 98 - 111 mmol/L   CO2 23 22 - 32 mmol/L   Glucose, Bld 70 70 - 99 mg/dL   BUN 38 (H) 8 - 23 mg/dL   Creatinine, Ser 8.52 (H) 0.44 - 1.00 mg/dL   Calcium 8.4 (L) 8.9 - 10.3 mg/dL   Total  Protein 6.6 6.5 - 8.1 g/dL   Albumin 2.7 (L) 3.5 - 5.0 g/dL   AST 47 (H) 15 - 41 U/L   ALT 20 0 - 44 U/L   Alkaline Phosphatase 49 38 - 126 U/L   Total Bilirubin 0.9 0.3 - 1.2 mg/dL   GFR, Estimated 5 (L) >60 mL/min   Anion gap 17 (H) 5 - 15  Lipid panel     Status: Abnormal   Collection Time: 07/25/22  1:58 AM  Result Value Ref Range   Cholesterol 198 0 - 200 mg/dL   Triglycerides 133 <150 mg/dL   HDL 38 (L) >40 mg/dL   Total CHOL/HDL Ratio 5.2 RATIO   VLDL 27 0 - 40 mg/dL   LDL Cholesterol 133 (H) 0 - 99 mg/dL  Heparin level (unfractionated)     Status: None   Collection Time: 07/25/22 11:41 AM  Result Value Ref Range   Heparin Unfractionated 0.44 0.30 - 0.70 IU/mL   DG Abd 1 View  Result Date: 07/24/2022 CLINICAL DATA:  Epigastric pain over the last 3 days.  Emesis. EXAM: ABDOMEN - 1 VIEW COMPARISON:  CT 03/13/2022 FINDINGS: Bowel gas pattern does not show evidence of ileus, obstruction or visible free air. Surgical clips present in the right upper quadrant consistent with previous cholecystectomy. 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 Patchy density in the lung bases as seen on yesterday's chest radiography. IMPRESSION: No acute radiographic finding. No evidence of ileus or obstruction. Previous cholecystectomy. 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 Electronically Signed   By: Nelson Chimes M.D.   On: 07/24/2022 09:39   ECHOCARDIOGRAM COMPLETE  Result Date: 07/24/2022    ECHOCARDIOGRAM REPORT   Patient Name:   [name] Date of Exam: 07/24/2022 Medical Rec #:  [id number]    Height:  70.0 in Accession #:    [id number]   Weight:       210.0 lb Date of Birth:  1960/08/27    BSA:          2.131 m Patient Age:    47 years     BP:           155/74 mmHg Patient Gender: F            HR:           93 bpm. Exam Location:  Inpatient Procedure: 2D Echo, Cardiac Doppler and Color  Doppler Indications:    NSTEMI  History:        Patient has prior history of Echocardiogram examinations.  Sonographer:    NA Referring Phys: Robinson  1. Left ventricular ejection fraction, by estimation, is 35 to 40%. The left ventricle has moderately decreased function. The left ventricle demonstrates regional wall motion abnormalities with anteroseptal and peri-apical hypokinesis. There is mild concentric left ventricular hypertrophy. Left ventricular diastolic parameters are consistent with Grade I diastolic dysfunction (impaired relaxation).  2. Right ventricular systolic function is normal. The right ventricular size is normal. There is mildly elevated pulmonary artery systolic pressure. The estimated right ventricular systolic pressure is 0000000 mmHg.  3. Left atrial size was mildly dilated.  4. The mitral valve is normal in structure. Trivial mitral valve regurgitation. Mild mitral stenosis. The mean mitral valve gradient is 5.0 mmHg. Moderate mitral annular calcification.  5. Tricuspid valve regurgitation is mild to moderate.  6. Small (0.9 cm) mobile vegetation attached to the aortic side of the aortic valve near the annulus. This was only seen in the parasternal short axis view. Would consider TEE for full evaluation. The aortic valve is tricuspid. There is mild calcification of the aortic valve. Aortic valve regurgitation is not visualized. No aortic stenosis is present.  7. The inferior vena cava is normal in size with greater than 50% respiratory variability, suggesting right atrial pressure of 3 mmHg. FINDINGS  Left Ventricle: Left ventricular ejection fraction, by estimation, is 35 to 40%. The left ventricle has moderately decreased function. The left ventricle demonstrates regional wall motion abnormalities. The left ventricular internal cavity size was normal in size. There is mild concentric left ventricular hypertrophy. Left ventricular diastolic parameters are  consistent with Grade I diastolic dysfunction (impaired relaxation). Right Ventricle: The right ventricular size is normal. No increase in right ventricular wall thickness. Right ventricular systolic function is normal. There is mildly elevated pulmonary artery systolic pressure. The tricuspid regurgitant velocity is 3.24  m/s, and with an assumed right atrial pressure of 3 mmHg, the estimated right ventricular systolic pressure is 0000000 mmHg. Left Atrium: Left atrial size was mildly dilated. Right Atrium: Right atrial size was normal in size. Pericardium: Trivial pericardial effusion is present. Mitral Valve: The mitral valve is normal in structure. There is mild calcification of the mitral valve leaflet(s). Moderate mitral annular calcification. Trivial mitral valve regurgitation. Mild mitral valve stenosis. MV peak gradient, 8.8 mmHg. The mean  mitral valve gradient is 5.0 mmHg. Tricuspid Valve: The tricuspid valve is normal in structure. Tricuspid valve regurgitation is mild to moderate. Aortic Valve: Small (0.9 cm) mobile vegetation attached to the aortic side of the aortic valve near the annulus. The aortic valve is tricuspid. There is mild calcification of the aortic valve. Aortic valve regurgitation is not visualized. No aortic stenosis is present. Aortic valve mean gradient measures 2.5 mmHg. Aortic valve peak gradient  measures 4.9 mmHg. Aortic valve area, by VTI measures 2.92 cm. Pulmonic Valve: The pulmonic valve was normal in structure. Pulmonic valve regurgitation is not visualized. Aorta: The aortic root is normal in size and structure. Venous: The inferior vena cava is normal in size with greater than 50% respiratory variability, suggesting right atrial pressure of 3 mmHg. IAS/Shunts: No atrial level shunt detected by color flow Doppler.  LEFT VENTRICLE PLAX 2D LVIDd:         4.90 cm   Diastology LVIDs:         3.70 cm   LV e' medial:    3.08 cm/s LV PW:         1.30 cm   LV E/e' medial:  31.0 LV IVS:         1.40 cm   LV e' lateral:   4.95 cm/s LVOT diam:     2.00 cm   LV E/e' lateral: 19.3 LV SV:         51 LV SV Index:   24 LVOT Area:     3.14 cm  RIGHT VENTRICLE             IVC RV Basal diam:  3.60 cm     IVC diam: 1.30 cm RV S prime:     13.60 cm/s TAPSE (M-mode): 2.1 cm LEFT ATRIUM           Index        RIGHT ATRIUM           Index LA diam:      3.90 cm 1.83 cm/m   RA Area:     13.40 cm LA Vol (A2C): 90.1 ml 42.28 ml/m  RA Volume:   33.05 ml  15.51 ml/m LA Vol (A4C): 51.6 ml 24.21 ml/m  AORTIC VALVE                    PULMONIC VALVE AV Area (Vmax):    2.80 cm     PV Vmax:       0.80 m/s AV Area (Vmean):   2.81 cm     PV Peak grad:  2.5 mmHg AV Area (VTI):     2.92 cm AV Vmax:           111.00 cm/s AV Vmean:          74.450 cm/s AV VTI:            0.173 m AV Peak Grad:      4.9 mmHg AV Mean Grad:      2.5 mmHg LVOT Vmax:         99.00 cm/s LVOT Vmean:        66.500 cm/s LVOT VTI:          0.161 m LVOT/AV VTI ratio: 0.93  AORTA Ao Root diam: 2.70 cm Ao Asc diam:  3.20 cm Ao Arch diam: 2.9 cm MITRAL VALVE                TRICUSPID VALVE MV Area (PHT): 10.11 cm    TR Peak grad:   42.0 mmHg MV Area VTI:   1.97 cm     TR Vmax:        324.00 cm/s MV Peak grad:  8.8 mmHg MV Mean grad:  5.0 mmHg     SHUNTS MV Vmax:       1.48 m/s     Systemic VTI:  0.16 m MV Vmean:      98.1  cm/s    Systemic Diam: 2.00 cm MV Decel Time: 75 msec MV E velocity: 95.60 cm/s MV A velocity: 167.00 cm/s MV E/A ratio:  0.57 Dalton McleanMD Electronically signed by Franki Monte Signature Date/Time: 07/24/2022/8:52:18 AM    Final    CT Head Wo Contrast  Result Date: 07/23/2022 CLINICAL DATA:  Mechanical fall at home, head and neck trauma. EXAM: CT HEAD WITHOUT CONTRAST CT CERVICAL SPINE WITHOUT CONTRAST TECHNIQUE: Multidetector CT imaging of the head and cervical spine was performed following the standard protocol without intravenous contrast. Multiplanar CT image reconstructions of the cervical spine were also generated.  RADIATION DOSE REDUCTION: This exam was performed according to the departmental dose-optimization program which includes automated exposure control, adjustment of the mA and/or kV according to patient size and/or use of iterative reconstruction technique. COMPARISON:  MR head examination dated March 18, 2022 FINDINGS: CT HEAD FINDINGS Brain: No evidence of acute infarction, hemorrhage, hydrocephalus, extra-axial collection or mass lesion/mass effect. Diffuse low-attenuation of the periventricular white matter presumed advanced chronic microvascular ischemic changes. Vascular: No hyperdense vessel or unexpected calcification. Skull: Normal. Negative for fracture or focal lesion. Sinuses/Orbits: No acute finding. Other: None. CT CERVICAL SPINE FINDINGS Alignment: Straightening of the cervical spine. Skull base and vertebrae: No acute fracture. No primary bone lesion or focal pathologic process. Soft tissues and spinal canal: No prevertebral fluid or swelling. No visible canal hematoma. Disc levels: Mild multilevel degenerate disc disease prominent at C5-C6 and C6-C7 with anterior osteophytes. Upper chest: Right lung apex opacity measuring 0.4 x 0.9 cm partially imaged. Other: None IMPRESSION: CT head: 1. No acute intracranial abnormality. 2. Advanced chronic microvascular ischemic changes of the white matter. CT cervical spine: 1. No evidence of cervical spine fracture or traumatic subluxation. 2. 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 3. Right lung apex opacity measuring 0.9 x 0.5 cm partially imaged, which may represent pneumonia. Follow-up examination to resolution is recommended. Electronically Signed   By: Keane Police D.O.   On: 07/23/2022 23:29   CT Cervical Spine Wo Contrast  Result Date: 07/23/2022 CLINICAL DATA:  Mechanical fall at home, head and neck trauma. EXAM: CT HEAD WITHOUT CONTRAST CT CERVICAL SPINE WITHOUT CONTRAST TECHNIQUE: Multidetector CT imaging of the head and  cervical spine was performed following the standard protocol without intravenous contrast. Multiplanar CT image reconstructions of the cervical spine were also generated. RADIATION DOSE REDUCTION: This exam was performed according to the departmental dose-optimization program which includes automated exposure control, adjustment of the mA and/or kV according to patient size and/or use of iterative reconstruction technique. COMPARISON:  MR head examination dated March 18, 2022 FINDINGS: CT HEAD FINDINGS Brain: No evidence of acute infarction, hemorrhage, hydrocephalus, extra-axial collection or mass lesion/mass effect. Diffuse low-attenuation of the periventricular white matter presumed advanced chronic microvascular ischemic changes. Vascular: No hyperdense vessel or unexpected calcification. Skull: Normal. Negative for fracture or focal lesion. Sinuses/Orbits: No acute finding. Other: None. CT CERVICAL SPINE FINDINGS Alignment: Straightening of the cervical spine. Skull base and vertebrae: No acute fracture. No primary bone lesion or focal pathologic process. Soft tissues and spinal canal: No prevertebral fluid or swelling. No visible canal hematoma. Disc levels: Mild multilevel degenerate disc disease prominent at C5-C6 and C6-C7 with anterior osteophytes. Upper chest: Right lung apex opacity measuring 0.4 x 0.9 cm partially imaged. Other: None IMPRESSION: CT head: 1. No acute intracranial abnormality. 2. Advanced chronic microvascular ischemic changes of the white matter. CT cervical spine: 1. No evidence of cervical  spine fracture or traumatic subluxation. 2. 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 3. Right lung apex opacity measuring 0.9 x 0.5 cm partially imaged, which may represent pneumonia. Follow-up examination to resolution is recommended. Electronically Signed   By: Keane Police D.O.   On: 07/23/2022 23:29   DG Pelvis 1-2 Views  Result Date: 07/23/2022 CLINICAL DATA:   Shortness of breath EXAM: PELVIS - 1-2 VIEW COMPARISON:  03/13/2022. FINDINGS: There is no evidence of pelvic fracture or diastasis. No pelvic bone lesions are seen. Calcified uterine fibroids confirmed by prior CT. IMPRESSION: No acute finding. Electronically Signed   By: Jorje Guild M.D.   On: 07/23/2022 22:06   DG Chest 2 View  Result Date: 07/23/2022 CLINICAL DATA:  Shortness of breath EXAM: CHEST - 2 VIEW COMPARISON:  05/02/2022 FINDINGS: Patchy bilateral airspace disease consistent with pneumonia. Mild cardiac enlargement. No visible effusion or pneumothorax. IMPRESSION: Multifocal pneumonia. Electronically Signed   By: Jorje Guild M.D.   On: 07/23/2022 22:06       Assessment/Plan: Diagnosis: Debility due to NSTEMI/Cardiac and multifocal pneumonia as main issues Does the need for close, 24 hr/day medical supervision in concert with the patient's rehab needs make it unreasonable for this patient to be served in a less intensive setting? 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 Yes Does the patient require coordinated care of a physician, rehab nurse, therapy disciplines of PT and OT to address physical and functional deficits in the context of the above medical diagnosis(es)? Yes Addressing deficits in the following areas: balance, endurance, locomotion, strength, transferring, bowel/bladder control, bathing, dressing, feeding, grooming, and toileting Can the patient actively participate in an intensive therapy program of at least 3 hrs of therapy per day at least 5 days per week? Yes The potential for patient to make measurable gains  while on inpatient rehab is good Anticipated functional outcomes upon discharge from inpatient rehab are supervision  with PT, supervision with OT, n/a with SLP. Estimated rehab length of stay to reach the above functional goals is: 10-14 days, possibly Anticipated discharge destination: Home Overall Rehab/Functional Prognosis: good  RECOMMENDATIONS: This patient's condition is appropriate for continued rehabilitative care in the following setting: CIR Patient has agreed to participate in recommended program. Yes Note that insurance prior authorization may be required for reimbursement for recommended care.  Comment: For her other medical issues Suggest xray of L foot since diffusely painful and could barely let me place light pressure on dorsum of L foot - from fall Suggested A [place] to nursing for sputum production Has moderate t o severe malnutrition with Albumin of 2.7 which is going down IV ABX for multifocal pneumonia as well as possible endocarditis Nausea is not improving- suggest Zofran as needed for nausea- could help without sedation- I think would be helpful.  Once pt medically ready and appropriate, would be a great candidate for inpt rehab Thank you for this consult.     I spent a total of  83  minutes on total care today- >50% coordination of care- due to  20 minutes doing chart review and 15 minutes documenting- also spent 40 minutes seeing patient and d/w pt's sister and spent 8 minutes d/w nursing.    Jinny Blossom  Petula Rotolo, MD 07/25/2022

## 2022-07-25 NOTE — Progress Notes (Signed)
Rounding Note    Patient Name: [name] Date of Encounter: 07/25/2022  [place] Cardiologist: None   Subjective   Reports chest pain when she coughs.  Inpatient Medications    Scheduled Meds:  amLODipine  10 mg Oral Daily   aspirin EC  81 mg Oral Daily   atorvastatin  40 mg Oral Daily   [START ON 07/26/2022] cinacalcet  90 mg Oral Q M,W,F-HD   metoprolol tartrate  12.5 mg Oral BID   pantoprazole  40 mg Oral Daily   polyethylene glycol  17 g Oral Daily   sucroferric oxyhydroxide  500 mg Oral TID WC   Continuous Infusions:  azithromycin Stopped (07/25/22 0831)   cefTRIAXone (ROCEPHIN)  IV Stopped (07/25/22 0831)   heparin 1,100 Units/hr (07/25/22 0403)   [START ON 07/26/2022] vancomycin     PRN Meds: acetaminophen, nitroGLYCERIN   Vital Signs    Vitals:   07/24/22 1855 07/24/22 2204 07/25/22 0025 07/25/22 0743  BP: 132/78 (!) 142/90 122/65 130/60  Pulse:  (!) 102 91 85  Resp: (!) 22 (!) 24 (!) 22 (!) 26  Temp: (!) 97.5 F (36.4 C) 97.7 F (36.5 C) 98.2 F (36.8 C) 98.1 F (36.7 C)  TempSrc: Oral Oral Oral Oral  SpO2: 98% 100% 100% 100%  Weight:      Height:        Intake/Output Summary (Last 24 hours) at 07/25/2022 0948 Last data filed at 07/25/2022 0300 Gross per 24 hour  Intake 977.55 ml  Output 3000 ml  Net -2022.45 ml      07/24/2022    6:03 PM 07/24/2022    2:00 PM 07/23/2022   11:37 PM  Last 3 Weights  Weight (lbs) 203 lb 0.7 oz 208 lb 8.9 oz 210 lb  Weight (kg) 92.1 kg 94.6 kg 95.255 kg      Telemetry    Normal sinus rhythm- Personally Reviewed  ECG      Physical Exam   GEN: No acute distress.  Easily startled Neck: No JVD Cardiac: RRR, no murmurs, rubs, or gallops.  Respiratory: Clear to auscultation bilaterally. GI: Soft, nontender, non-distended  MS: No edema; No deformity. Neuro:  Nonfocal  Psych: Normal affect   Labs    High Sensitivity Troponin:   Recent Labs  Lab 07/23/22 2244 07/24/22 0120  07/24/22 0733 07/24/22 1221 07/24/22 1502  TROPONINIHS 2,155* 1,948* 6,242* 4,206* 4,276*     Chemistry Recent Labs  Lab 07/23/22 2244 07/23/22 2252 07/24/22 0120 07/25/22 0158  NA 137 136 139 136  K 4.4 4.6 4.9 4.0  CL 94* 101 96* 96*  CO2 21*  --  23 23  GLUCOSE 136* 132* 134* 70  BUN 64* 57* 64* 38*  CREATININE 13.99* 16.60* 14.02* 8.52*  CALCIUM 9.0  --  8.8* 8.4*  MG 2.6*  --   --   --   PROT 7.5  --  7.0 6.6  ALBUMIN 3.2*  --  3.0* 2.7*  AST 44*  --  48* 47*  ALT 19  --  17 20  ALKPHOS 57  --  51 49  BILITOT 0.8  --  0.6 0.9  GFRNONAA 3*  --  3* 5*  ANIONGAP 22*  --  20* 17*    Lipids  Recent Labs  Lab 07/25/22 0158  CHOL 198  TRIG 133  HDL 38*  LDLCALC 133*  CHOLHDL 5.2    Hematology Recent Labs  Lab 07/24/22 0120 07/24/22 1221 07/25/22 0158  WBC 11.9* 8.9 7.0  RBC 4.18 3.86* 4.10  HGB 11.8* 11.0* 11.4*  HCT 36.1 32.7* 35.3*  MCV 86.4 84.7 86.1  MCH 28.2 28.5 27.8  MCHC 32.7 33.6 32.3  RDW 14.3 14.7 14.6  PLT 260 246 254   Thyroid No results for input(s): "TSH", "FREET4" in the last 168 hours.  BNPNo results for input(s): "BNP", "PROBNP" in the last 168 hours.  DDimer No results for input(s): "DDIMER" in the last 168 hours.   Radiology    DG Abd 1 View  Result Date: 07/24/2022 CLINICAL DATA:  Epigastric pain over the last 3 days.  Emesis. EXAM: ABDOMEN - 1 VIEW COMPARISON:  CT 03/13/2022 FINDINGS: Bowel gas pattern does not show evidence of ileus, obstruction or visible free air. Surgical clips present in the right upper quadrant consistent with previous cholecystectomy. 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 Patchy density in the lung bases as seen on yesterday's chest radiography. IMPRESSION: No acute radiographic finding. No evidence of ileus or obstruction. Previous cholecystectomy. 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  Electronically Signed   By: Nelson Chimes M.D.   On: 07/24/2022 09:39   ECHOCARDIOGRAM COMPLETE  Result Date: 07/24/2022    ECHOCARDIOGRAM REPORT   Patient Name:   [name] Date of Exam: 07/24/2022 Medical Rec #:  [id number]    Height:       70.0 in Accession #:    [id number]   Weight:       210.0 lb Date of Birth:  19-Jan-1961    BSA:          2.131 m Patient Age:    72 years     BP:           155/74 mmHg Patient Gender: F            HR:           93 bpm. Exam Location:  Inpatient Procedure: 2D Echo, Cardiac Doppler and Color Doppler Indications:    NSTEMI  History:        Patient has prior history of Echocardiogram examinations.  Sonographer:    NA Referring Phys: [place]  1. Left ventricular ejection fraction, by estimation, is 35 to 40%. The left ventricle has moderately decreased function. The left ventricle demonstrates regional wall motion abnormalities with anteroseptal and peri-apical hypokinesis. There is mild concentric left ventricular hypertrophy. Left ventricular diastolic parameters are consistent with Grade I diastolic dysfunction (impaired relaxation).  2. Right ventricular systolic function is normal. The right ventricular size is normal. There is mildly elevated pulmonary artery systolic pressure. The estimated right ventricular systolic pressure is 0000000 mmHg.  3. Left atrial size was mildly dilated.  4. The mitral valve is normal in structure. Trivial mitral valve regurgitation. Mild mitral stenosis. The mean mitral valve gradient is 5.0 mmHg. Moderate mitral annular calcification.  5. Tricuspid valve regurgitation is mild to moderate.  6. Small (0.9 cm) mobile vegetation attached to the aortic side of the aortic valve near the annulus. This was only seen in the parasternal short axis view. Would consider TEE for full evaluation. The aortic valve is tricuspid. There is mild calcification of the aortic valve. Aortic valve regurgitation is not visualized. No aortic  stenosis is present.  7. The inferior vena cava is normal in size with greater than 50% respiratory variability, suggesting right atrial pressure of 3 mmHg. FINDINGS  Left Ventricle: Left ventricular ejection fraction, by estimation, is 35 to 40%. The left ventricle has moderately decreased function. The left ventricle demonstrates regional wall motion abnormalities. The left ventricular internal cavity size was normal in size. There is mild concentric left ventricular hypertrophy. Left ventricular diastolic parameters are consistent with Grade I diastolic dysfunction (impaired relaxation). Right Ventricle: The right ventricular size is normal. No increase in right ventricular wall thickness. Right ventricular systolic function is normal. There is mildly elevated pulmonary artery systolic pressure. The tricuspid regurgitant velocity is 3.24  m/s, and with an assumed right atrial pressure of 3 mmHg, the estimated right ventricular systolic pressure is 0000000 mmHg. Left Atrium: Left atrial size was mildly dilated. Right Atrium: Right atrial size was normal in size. Pericardium: Trivial pericardial effusion is present. Mitral Valve: The mitral valve is normal in structure. There is mild calcification of the mitral valve leaflet(s). Moderate mitral annular calcification. Trivial mitral valve regurgitation. Mild mitral valve stenosis. MV peak gradient, 8.8 mmHg. The mean  mitral valve gradient is 5.0 mmHg. Tricuspid Valve: The tricuspid valve is normal in structure. Tricuspid valve regurgitation is mild to moderate. Aortic Valve: Small (0.9 cm) mobile vegetation attached to the aortic side of the aortic valve near the annulus. The aortic valve is tricuspid. There is mild calcification of the aortic valve. Aortic valve regurgitation is not visualized. No aortic stenosis is present. Aortic valve mean gradient measures 2.5 mmHg. Aortic valve peak gradient measures 4.9 mmHg. Aortic valve area, by VTI measures 2.92 cm.  Pulmonic Valve: The pulmonic valve was normal in structure. Pulmonic valve regurgitation is not visualized. Aorta: The aortic root is normal in size and structure. Venous: The inferior vena cava is normal in size with greater than 50% respiratory variability, suggesting right atrial pressure of 3 mmHg. IAS/Shunts: No atrial level shunt detected by color flow Doppler.  LEFT VENTRICLE PLAX 2D LVIDd:         4.90 cm   Diastology LVIDs:         3.70 cm   LV e' medial:    3.08 cm/s LV PW:         1.30 cm   LV E/e' medial:  31.0 LV IVS:        1.40 cm   LV e' lateral:   4.95 cm/s LVOT diam:     2.00 cm   LV E/e' lateral: 19.3 LV SV:         51 LV SV Index:   24 LVOT Area:     3.14 cm  RIGHT VENTRICLE             IVC RV Basal diam:  3.60 cm     IVC diam: 1.30 cm RV S prime:     13.60 cm/s TAPSE (M-mode): 2.1 cm LEFT ATRIUM           Index        RIGHT ATRIUM           Index LA diam:      3.90 cm 1.83 cm/m   RA Area:     13.40 cm LA Vol (A2C): 90.1 ml 42.28 ml/m  RA Volume:   33.05 ml  15.51 ml/m LA Vol (A4C): 51.6 ml 24.21 ml/m  AORTIC VALVE                    PULMONIC VALVE AV Area (Vmax):    2.80 cm     PV Vmax:  0.80 m/s AV Area (Vmean):   2.81 cm     PV Peak grad:  2.5 mmHg AV Area (VTI):     2.92 cm AV Vmax:           111.00 cm/s AV Vmean:          74.450 cm/s AV VTI:            0.173 m AV Peak Grad:      4.9 mmHg AV Mean Grad:      2.5 mmHg LVOT Vmax:         99.00 cm/s LVOT Vmean:        66.500 cm/s LVOT VTI:          0.161 m LVOT/AV VTI ratio: 0.93  AORTA Ao Root diam: 2.70 cm Ao Asc diam:  3.20 cm Ao Arch diam: 2.9 cm MITRAL VALVE                TRICUSPID VALVE MV Area (PHT): 10.11 cm    TR Peak grad:   42.0 mmHg MV Area VTI:   1.97 cm     TR Vmax:        324.00 cm/s MV Peak grad:  8.8 mmHg MV Mean grad:  5.0 mmHg     SHUNTS MV Vmax:       1.48 m/s     Systemic VTI:  0.16 m MV Vmean:      98.1 cm/s    Systemic Diam: 2.00 cm MV Decel Time: 75 msec MV E velocity: 95.60 cm/s MV A velocity: 167.00 cm/s  MV E/A ratio:  0.57 Dalton McleanMD Electronically signed by Franki Monte Signature Date/Time: 07/24/2022/8:52:18 AM    Final    CT Head Wo Contrast  Result Date: 07/23/2022 CLINICAL DATA:  Mechanical fall at home, head and neck trauma. EXAM: CT HEAD WITHOUT CONTRAST CT CERVICAL SPINE WITHOUT CONTRAST TECHNIQUE: Multidetector CT imaging of the head and cervical spine was performed following the standard protocol without intravenous contrast. Multiplanar CT image reconstructions of the cervical spine were also generated. RADIATION DOSE REDUCTION: This exam was performed according to the departmental dose-optimization program which includes automated exposure control, adjustment of the mA and/or kV according to patient size and/or use of iterative reconstruction technique. COMPARISON:  MR head examination dated March 18, 2022 FINDINGS: CT HEAD FINDINGS Brain: No evidence of acute infarction, hemorrhage, hydrocephalus, extra-axial collection or mass lesion/mass effect. Diffuse low-attenuation of the periventricular white matter presumed advanced chronic microvascular ischemic changes. Vascular: No hyperdense vessel or unexpected calcification. Skull: Normal. Negative for fracture or focal lesion. Sinuses/Orbits: No acute finding. Other: None. CT CERVICAL SPINE FINDINGS Alignment: Straightening of the cervical spine. Skull base and vertebrae: No acute fracture. No primary bone lesion or focal pathologic process. Soft tissues and spinal canal: No prevertebral fluid or swelling. No visible canal hematoma. Disc levels: Mild multilevel degenerate disc disease prominent at C5-C6 and C6-C7 with anterior osteophytes. Upper chest: Right lung apex opacity measuring 0.4 x 0.9 cm partially imaged. Other: None IMPRESSION: CT head: 1. No acute intracranial abnormality. 2. Advanced chronic microvascular ischemic changes of the white matter. CT cervical spine: 1. No evidence of cervical spine fracture or traumatic  subluxation. 2. 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 3. Right lung apex opacity measuring 0.9 x 0.5 cm partially imaged, which may represent pneumonia. Follow-up examination to resolution is recommended. Electronically Signed   By: Keane Police D.O.   On: 07/23/2022 23:29   CT Cervical  Spine Wo Contrast  Result Date: 07/23/2022 CLINICAL DATA:  Mechanical fall at home, head and neck trauma. EXAM: CT HEAD WITHOUT CONTRAST CT CERVICAL SPINE WITHOUT CONTRAST TECHNIQUE: Multidetector CT imaging of the head and cervical spine was performed following the standard protocol without intravenous contrast. Multiplanar CT image reconstructions of the cervical spine were also generated. RADIATION DOSE REDUCTION: This exam was performed according to the departmental dose-optimization program which includes automated exposure control, adjustment of the mA and/or kV according to patient size and/or use of iterative reconstruction technique. COMPARISON:  MR head examination dated March 18, 2022 FINDINGS: CT HEAD FINDINGS Brain: No evidence of acute infarction, hemorrhage, hydrocephalus, extra-axial collection or mass lesion/mass effect. Diffuse low-attenuation of the periventricular white matter presumed advanced chronic microvascular ischemic changes. Vascular: No hyperdense vessel or unexpected calcification. Skull: Normal. Negative for fracture or focal lesion. Sinuses/Orbits: No acute finding. Other: None. CT CERVICAL SPINE FINDINGS Alignment: Straightening of the cervical spine. Skull base and vertebrae: No acute fracture. No primary bone lesion or focal pathologic process. Soft tissues and spinal canal: No prevertebral fluid or swelling. No visible canal hematoma. Disc levels: Mild multilevel degenerate disc disease prominent at C5-C6 and C6-C7 with anterior osteophytes. Upper chest: Right lung apex opacity measuring 0.4 x 0.9 cm partially imaged. Other: None IMPRESSION: CT head: 1. No acute  intracranial abnormality. 2. Advanced chronic microvascular ischemic changes of the white matter. CT cervical spine: 1. No evidence of cervical spine fracture or traumatic subluxation. 2. 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 3. Right lung apex opacity measuring 0.9 x 0.5 cm partially imaged, which may represent pneumonia. Follow-up examination to resolution is recommended. Electronically Signed   By: Keane Police D.O.   On: 07/23/2022 23:29   DG Pelvis 1-2 Views  Result Date: 07/23/2022 CLINICAL DATA:  Shortness of breath EXAM: PELVIS - 1-2 VIEW COMPARISON:  03/13/2022. FINDINGS: There is no evidence of pelvic fracture or diastasis. No pelvic bone lesions are seen. Calcified uterine fibroids confirmed by prior CT. IMPRESSION: No acute finding. Electronically Signed   By: Jorje Guild M.D.   On: 07/23/2022 22:06   DG Chest 2 View  Result Date: 07/23/2022 CLINICAL DATA:  Shortness of breath EXAM: CHEST - 2 VIEW COMPARISON:  05/02/2022 FINDINGS: Patchy bilateral airspace disease consistent with pneumonia. Mild cardiac enlargement. No visible effusion or pneumothorax. IMPRESSION: Multifocal pneumonia. Electronically Signed   By: Jorje Guild M.D.   On: 07/23/2022 22:06    Cardiac Studies   Echocardiogram showing apical hypokinesis and decreased LV function,, question of aortic valve vegetation  Patient Profile     62 y.o. female with ESRD, elevated troponin and decreased LV function  Assessment & Plan    Acute systolic heart failure: Wall motion abnormality may be consistent with Takotsubo cardiomyopathy.  She had a fall and was down on the ground for 12 hours.  Will plan for cardiac cath today.  Patient is n.p.o. I discussed this with the patient and with the patient's daughter.  Would not try to cross the aortic valve aggressively due to the question of aortic valve vegetation.  Will plan for TEE tomorrow.  This was also discussed with the patient's daughter.  All  questions about the cardiac catheterization were answered.  ESRD: Dialysis tomorrow.  Pneumonia: On antibiotics.  Vancomycin was added to cover MRSA and for potential endocarditis.  Blood cultures pending.  Apparently blood cultures were drawn shortly after antibiotics were given.       For  questions or updates, please contact [facility] [place] Please consult [email or web address] for contact info under        Signed, Larae Grooms, MD  07/25/2022, 9:48 AM

## 2022-07-25 NOTE — Interval H&P Note (Signed)
History and Physical Interval Note:  07/25/2022 1:38 PM  [name]  has presented today for surgery, with the diagnosis of chest pain.  The various methods of treatment have been discussed with the patient and family. After consideration of risks, benefits and other options for treatment, the patient has consented to  Procedure(s): LEFT HEART CATH AND CORONARY ANGIOGRAPHY (N/A) as a surgical intervention.  The patient's history has been reviewed, patient examined, no change in status, stable for surgery.  I have reviewed the patient's chart and labs.  Questions were answered to the patient's satisfaction.    Cath Lab Visit (complete for each Cath Lab visit)  Clinical Evaluation Leading to the Procedure:   ACS: Yes.    Non-ACS:    Anginal Classification: CCS III  Anti-ischemic medical therapy: Minimal Therapy (1 class of medications)  Non-Invasive Test Results: No non-invasive testing performed  Prior CABG: No previous CABG       [name] 07/25/2022 1:38 PM

## 2022-07-25 NOTE — Progress Notes (Signed)
PT Cancellation Note  Patient Details Name: [name] MRN: [id number] DOB: 1961/04/29   Cancelled Treatment:    Reason Eval/Treat Not Completed: Other (comment). Pt declining participation in therapy. She just finished working with OT and is now waiting on transport for cardiac cath.    Lorriane Shire 07/25/2022, 11:42 AM

## 2022-07-25 NOTE — Progress Notes (Signed)
Inpatient Rehab Admissions Coordinator:   Stopped by to see pt today, in cath lab.  Note plans for TEE tomorrow.  Will try to f/u.  Shann Medal, PT, DPT Admissions Coordinator [phone] 07/25/22  4:35 PM

## 2022-07-25 NOTE — Evaluation (Signed)
Occupational Therapy Evaluation Patient Details Name: [name] MRN: [id number] DOB: 08-Dec-1960 Today's Date: 07/25/2022   History of Present Illness 62 y.o. female presents to [facility] hospital on 07/23/2022 with chest pain after being found down at home, found to have NSTEMI and PNA. PMH includes DM, ESRD, GERD, HLD, HTN.   Clinical Impression   Pt admitted with the above diagnoses and presents with below problem list. Pt will benefit from continued acute OT to address the below listed deficits and maximize independence with basic ADLs prior to d/c to venue below. PTA pt was independent with ADLs. Pt presents with deficits in strength, activity tolerance, cognition, and balance. Pt currently needs mod-max A with UB/LB ADLs. Limited by onset of dizziness EOB, pt alos c/o feeling hot (baseline cold natured per sister). Posterior lean EOB. HR 88, SpO2 on RA 100, BP 128/88 EOB.        Recommendations for follow up therapy are one component of a multi-disciplinary discharge planning process, led by the attending physician.  Recommendations may be updated based on patient status, additional functional criteria and insurance authorization.   Follow Up Recommendations  Acute inpatient rehab (3hours/day)     Assistance Recommended at Discharge Frequent or constant Supervision/Assistance  Patient can return home with the following A lot of help with walking and/or transfers;A lot of help with bathing/dressing/bathroom;Assist for transportation;Direct supervision/assist for medications management;Assistance with cooking/housework;Help with stairs or ramp for entrance    Functional Status Assessment  Patient has had a recent decline in their functional status and demonstrates the ability to make significant improvements in function in a reasonable and predictable amount of time.  Equipment Recommendations   (defer to next venue)    Recommendations for Other Services       Precautions / Restrictions  Precautions Precautions: Fall Restrictions Weight Bearing Restrictions: No      Mobility Bed Mobility Overal bed mobility: Needs Assistance Bed Mobility: Supine to Sit, Sit to Supine     Supine to sit: Mod assist, HOB elevated Sit to supine: Mod assist, HOB elevated   General bed mobility comments: assist to advance BLE and powerup trunk. Multimodal cues needed. Decreased inititation. Difficulty sequencing.    Transfers                   General transfer comment: not assessed 2/2 dizziness EOB which did not resolve with time. LOB posteriorly sitting. up to mod A for static sitting balance.      Balance Overall balance assessment: Needs assistance Sitting-balance support: No upper extremity supported, Feet supported Sitting balance-Leahy Scale: Poor Sitting balance - Comments: fair-poor. pt able to sit with no UE for a few seconds then quickly drifts posterior. + dizziness. Decreased righting reaction and pt with difficulty following cues for BUE support position. Postural control: Posterior lean Standing balance support: No upper extremity supported                               ADL either performed or assessed with clinical judgement   ADL Overall ADL's : Needs assistance/impaired Eating/Feeding: NPO   Grooming: Set up;Bed level   Upper Body Bathing: Sitting;Maximal assistance   Lower Body Bathing: Maximal assistance;Sitting/lateral leans;Bed level   Upper Body Dressing : Moderate assistance;Sitting   Lower Body Dressing: Maximal assistance;Sitting/lateral leans;Bed level                 General ADL Comments: Pt limited by  onset of dizziness EOB. Mod A for static sitting balance. Posterior lean and LOB.     Vision         Perception     Praxis      Pertinent Vitals/Pain Pain Assessment Pain Assessment: Faces Faces Pain Scale: Hurts a little bit Pain Location: abdomen Pain Descriptors / Indicators: Discomfort Pain  Intervention(s): Monitored during session, Limited activity within patient's tolerance, Repositioned     Hand Dominance Right   Extremity/Trunk Assessment Upper Extremity Assessment Upper Extremity Assessment: Generalized weakness   Lower Extremity Assessment Lower Extremity Assessment: Defer to PT evaluation       Communication Communication Communication: No difficulties   Cognition Arousal/Alertness: Awake/alert Behavior During Therapy: Flat affect, Anxious Overall Cognitive Status: Impaired/Different from baseline Area of Impairment: Following commands, Attention, Awareness, Problem solving                   Current Attention Level: Focused   Following Commands: Follows one step commands with increased time   Awareness: Intellectual Problem Solving: Slow processing, Decreased initiation, Difficulty sequencing, Requires verbal cues, Requires tactile cues General Comments: Pt needs multimodal cues for bed mobility. Decreased initiation. Pt with LOB posteriorly sitting EOB, delayed awareness of this by pt. Minimal verbalizations throughout session. Flat affect. Internally distracted? Pt c/o dizziness sitting EOB. VSS.     General Comments  VSS, HR 88 EOB, BP 128/88 sitting EOB. Pt back noted to be damp and pt c/o feeling hot, diaphoresis? Provided portable table fan. Pt's sister present throughout session.    Exercises     Shoulder Instructions      Home Living Family/patient expects to be discharged to:: Private residence Living Arrangements: Children Available Help at Discharge: Family;Available PRN/intermittently Type of Home: Apartment Home Access: Stairs to enter Entrance Stairs-Number of Steps: flight Entrance Stairs-Rails: Right Home Layout: One level     Bathroom Shower/Tub: Teacher, early years/pre: Standard     Home Equipment: None          Prior Functioning/Environment Prior Level of Function : Independent/Modified Independent              Mobility Comments: ambulatory without DME, 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          OT Problem List: Decreased strength;Decreased activity tolerance;Impaired balance (sitting and/or standing);Decreased cognition;Decreased knowledge of use of DME or AE;Decreased knowledge of precautions;Pain;Cardiopulmonary status limiting activity      OT Treatment/Interventions: Self-care/ADL training;Energy conservation;DME and/or AE instruction;Therapeutic activities;Cognitive remediation/compensation;Patient/family education;Balance training    OT Goals(Current goals can be found in the care plan section) Acute Rehab OT Goals Patient Stated Goal: not stated OT Goal Formulation: With patient/family Time For Goal Achievement: 08/08/22 Potential to Achieve Goals: Good  OT Frequency: Min 2X/week    Co-evaluation              AM-PAC OT "6 Clicks" Daily Activity     Outcome Measure Help from another person eating meals?: None Help from another person taking care of personal grooming?: A Little Help from another person toileting, which includes using toliet, bedpan, or urinal?: A Lot Help from another person bathing (including washing, rinsing, drying)?: A Lot Help from another person to put on and taking off regular upper body clothing?: A Little Help from another person to put on and taking off regular lower body clothing?: A Lot 6 Click Score: 16   End of Session    Activity Tolerance: Patient limited by fatigue;Other (comment) (+  dizziness EOB) Patient left: in bed;with call bell/phone within reach;with family/visitor present  OT Visit Diagnosis: Unsteadiness on feet (R26.81);Muscle weakness (generalized) (M62.81);History of falling (Z91.81);Other symptoms and signs involving cognitive function;Pain                Time: [id number] OT Time Calculation (min): 21 min Charges:  OT General Charges $OT Visit: 1 Visit OT Evaluation $OT Eval Moderate Complexity: [place], OT Acute Rehabilitation Services Office: [phone]   Hortencia Pilar 07/25/2022, 10:58 AM

## 2022-07-26 ENCOUNTER — Other Ambulatory Visit (HOSPITAL_COMMUNITY): Payer: Self-pay

## 2022-07-26 ENCOUNTER — Inpatient Hospital Stay (HOSPITAL_COMMUNITY): Payer: 59 | Admitting: Certified Registered Nurse Anesthetist

## 2022-07-26 ENCOUNTER — Encounter (HOSPITAL_COMMUNITY): Payer: Self-pay | Admitting: Cardiology

## 2022-07-26 ENCOUNTER — Encounter (HOSPITAL_COMMUNITY)
Admission: EM | Disposition: A | Discharge status: Skilled Nursing Facility | Payer: Self-pay | Source: Home / Self Care | Attending: Family Medicine

## 2022-07-26 ENCOUNTER — Inpatient Hospital Stay (HOSPITAL_COMMUNITY): Payer: 59

## 2022-07-26 DIAGNOSIS — Z87891 Personal history of nicotine dependence: Secondary | ICD-10-CM

## 2022-07-26 DIAGNOSIS — R7989 Other specified abnormal findings of blood chemistry: Secondary | ICD-10-CM | POA: Diagnosis not present

## 2022-07-26 DIAGNOSIS — I132 Hypertensive heart and chronic kidney disease with heart failure and with stage 5 chronic kidney disease, or end stage renal disease: Secondary | ICD-10-CM

## 2022-07-26 DIAGNOSIS — I081 Rheumatic disorders of both mitral and tricuspid valves: Secondary | ICD-10-CM

## 2022-07-26 DIAGNOSIS — I340 Nonrheumatic mitral (valve) insufficiency: Secondary | ICD-10-CM | POA: Diagnosis not present

## 2022-07-26 DIAGNOSIS — I252 Old myocardial infarction: Secondary | ICD-10-CM

## 2022-07-26 DIAGNOSIS — I509 Heart failure, unspecified: Secondary | ICD-10-CM | POA: Diagnosis not present

## 2022-07-26 DIAGNOSIS — I330 Acute and subacute infective endocarditis: Secondary | ICD-10-CM | POA: Diagnosis present

## 2022-07-26 DIAGNOSIS — Z992 Dependence on renal dialysis: Secondary | ICD-10-CM

## 2022-07-26 DIAGNOSIS — N186 End stage renal disease: Secondary | ICD-10-CM

## 2022-07-26 DIAGNOSIS — D631 Anemia in chronic kidney disease: Secondary | ICD-10-CM

## 2022-07-26 DIAGNOSIS — J189 Pneumonia, unspecified organism: Secondary | ICD-10-CM | POA: Diagnosis not present

## 2022-07-26 DIAGNOSIS — I214 Non-ST elevation (NSTEMI) myocardial infarction: Secondary | ICD-10-CM | POA: Diagnosis not present

## 2022-07-26 HISTORY — PX: TEE WITHOUT CARDIOVERSION: SHX5443

## 2022-07-26 LAB — CBC
HCT: 30.2 % — ABNORMAL LOW (ref 36.0–46.0)
HCT: 33.1 % — ABNORMAL LOW (ref 36.0–46.0)
Hemoglobin: 10 g/dL — ABNORMAL LOW (ref 12.0–15.0)
Hemoglobin: 10.6 g/dL — ABNORMAL LOW (ref 12.0–15.0)
MCH: 28.3 pg (ref 26.0–34.0)
MCH: 27.8 pg (ref 26.0–34.0)
MCHC: 33.1 g/dL (ref 30.0–36.0)
MCHC: 32 g/dL (ref 30.0–36.0)
MCV: 85.6 fL (ref 80.0–100.0)
MCV: 86.9 fL (ref 80.0–100.0)
Platelets: 249 10*3/uL (ref 150–400)
Platelets: 260 10*3/uL (ref 150–400)
RBC: 3.53 MIL/uL — ABNORMAL LOW (ref 3.87–5.11)
RBC: 3.81 MIL/uL — ABNORMAL LOW (ref 3.87–5.11)
RDW: 14.7 % (ref 11.5–15.5)
RDW: 14.7 % (ref 11.5–15.5)
WBC: 5.6 10*3/uL (ref 4.0–10.5)
WBC: 7.4 10*3/uL (ref 4.0–10.5)
nRBC: 0 % (ref 0.0–0.2)
nRBC: 0 % (ref 0.0–0.2)

## 2022-07-26 LAB — BASIC METABOLIC PANEL
Anion gap: 16 — ABNORMAL HIGH (ref 5–15)
BUN: 56 mg/dL — ABNORMAL HIGH (ref 8–23)
CO2: 24 mmol/L (ref 22–32)
Calcium: 8.3 mg/dL — ABNORMAL LOW (ref 8.9–10.3)
Chloride: 96 mmol/L — ABNORMAL LOW (ref 98–111)
Creatinine, Ser: 10.28 mg/dL — ABNORMAL HIGH (ref 0.44–1.00)
GFR, Estimated: 4 mL/min — ABNORMAL LOW (ref >60)
Glucose, Bld: 65 mg/dL — ABNORMAL LOW (ref 70–99)
Potassium: 3.7 mmol/L (ref 3.5–5.1)
Sodium: 136 mmol/L (ref 135–145)

## 2022-07-26 LAB — GLUCOSE, CAPILLARY: Glucose-Capillary: 78 mg/dL (ref 70–99)

## 2022-07-26 LAB — ECHO TEE

## 2022-07-26 LAB — HEPATITIS B SURFACE ANTIBODY, QUANTITATIVE: Hep B S AB Quant (Post): 189.7 m[IU]/mL (ref >9.9)

## 2022-07-26 SURGERY — ECHOCARDIOGRAM, TRANSESOPHAGEAL
Anesthesia: Monitor Anesthesia Care

## 2022-07-26 MED ORDER — HEPARIN SODIUM (PORCINE) 1000 UNIT/ML DIALYSIS: 1000.0000 [IU] | INTRAMUSCULAR | Status: DC | PRN

## 2022-07-26 MED ORDER — ISOSORB DINITRATE-HYDRALAZINE 20-37.5 MG PO TABS
1.0000 | ORAL_TABLET | Freq: Three times a day (TID) | ORAL | Status: DC
  Administered 2022-07-26 – 2022-07-29 (×10): 1 via ORAL
  Filled 2022-07-26 (×10): qty 1

## 2022-07-26 MED ORDER — ANTICOAGULANT SODIUM CITRATE 4% (200MG/5ML) IV SOLN: 5.0000 mL | Status: DC | PRN

## 2022-07-26 MED ORDER — PHENYLEPHRINE 80 MCG/ML (10ML) SYRINGE FOR IV PUSH (FOR BLOOD PRESSURE SUPPORT): PREFILLED_SYRINGE | INTRAVENOUS | Status: DC | PRN

## 2022-07-26 MED ORDER — LIDOCAINE-PRILOCAINE 2.5-2.5 % EX CREA: 1.0000 | TOPICAL_CREAM | CUTANEOUS | Status: DC | PRN

## 2022-07-26 MED ORDER — ALTEPLASE 2 MG IJ SOLR: 2.0000 mg | Freq: Once | INTRAMUSCULAR | Status: DC | PRN

## 2022-07-26 MED ORDER — DIPHENHYDRAMINE HCL 50 MG/ML IJ SOLN
INTRAMUSCULAR | Status: AC
  Filled 2022-07-26: qty 1

## 2022-07-26 MED ORDER — PROPOFOL 10 MG/ML IV BOLUS
INTRAVENOUS | Status: DC | PRN
  Administered 2022-07-26: 30 mg via INTRAVENOUS
  Administered 2022-07-26: 20 mg via INTRAVENOUS

## 2022-07-26 MED ORDER — HEPARIN SODIUM (PORCINE) 5000 UNIT/ML IJ SOLN
5000.0000 [IU] | Freq: Three times a day (TID) | INTRAMUSCULAR | Status: DC
  Administered 2022-07-26 – 2022-08-08 (×38): 5000 [IU] via SUBCUTANEOUS
  Filled 2022-07-26 (×39): qty 1

## 2022-07-26 MED ORDER — SODIUM CHLORIDE 0.9 % IV SOLN: INTRAVENOUS | Status: DC

## 2022-07-26 MED ORDER — LIDOCAINE HCL (PF) 1 % IJ SOLN: 5.0000 mL | INTRAMUSCULAR | Status: DC | PRN

## 2022-07-26 MED ORDER — PROPOFOL 500 MG/50ML IV EMUL
INTRAVENOUS | Status: DC | PRN
  Administered 2022-07-26: 50 ug/kg/min via INTRAVENOUS

## 2022-07-26 MED ORDER — DIPHENHYDRAMINE HCL 50 MG/ML IJ SOLN
25.0000 mg | Freq: Once | INTRAMUSCULAR | Status: AC
  Administered 2022-07-26: 25 mg via INTRAVENOUS

## 2022-07-26 MED ORDER — PENTAFLUOROPROP-TETRAFLUOROETH EX AERO: 1.0000 | INHALATION_SPRAY | CUTANEOUS | Status: DC | PRN

## 2022-07-26 MED ORDER — ASPIRIN 81 MG PO TBEC
81.0000 mg | DELAYED_RELEASE_TABLET | Freq: Every day | ORAL | Status: DC
  Administered 2022-07-27 – 2022-08-08 (×13): 81 mg via ORAL
  Filled 2022-07-26 (×13): qty 1

## 2022-07-26 NOTE — Progress Notes (Signed)
[facility] Progress Note   Subjective:   Patient seen and examined at bedside.  Denies CP, SOB, abdominal pain and n/v/d.  Hopeful about may be going to inpatient rehab to help improve debility.  Going to TEE soon.   Objective Vitals:   07/26/22 0544 07/26/22 0750 07/26/22 0951 07/26/22 1010  BP: 132/66 134/61 (!) 144/76 122/69  Pulse: 81 81 85 89  Resp: 20 20 15 18   Temp: 98 F (36.7 C) 97.7 F (36.5 C) 97.7 F (36.5 C) 97.7 F (36.5 C)  TempSrc: Oral Oral Oral Temporal  SpO2: 100% 100%  96%  Weight:      Height:       Physical Exam General:alert, female in NAD Heart:RRR, no mrg Lungs:+scattered rhonchi, nml WOB on RA Abdomen:soft, NTND Extremities:no LE edema Dialysis Access: LU AVF +b/t   Filed Weights   07/23/22 2337 07/24/22 1400 07/24/22 1803  Weight: 95.3 kg 94.6 kg 92.1 kg    Intake/Output Summary (Last 24 hours) at 07/26/2022 1046 Last data filed at 07/25/2022 1800 Gross per 24 hour  Intake 111.86 ml  Output --  Net 111.86 ml    Additional Objective Labs: Basic Metabolic Panel: Recent Labs  Lab 07/24/22 0120 07/24/22 1502 07/25/22 0158 07/26/22 0244  NA 139  --  136 136  K 4.9  --  4.0 3.7  CL 96*  --  96* 96*  CO2 23  --  23 24  GLUCOSE 134*  --  70 65*  BUN 64*  --  38* 56*  CREATININE 14.02*  --  8.52* 10.28*  CALCIUM 8.8*  --  8.4* 8.3*  PHOS  --  6.8*  --   --    Liver Function Tests: Recent Labs  Lab 07/23/22 2244 07/24/22 0120 07/25/22 0158  AST 44* 48* 47*  ALT 19 17 20   ALKPHOS 57 51 49  BILITOT 0.8 0.6 0.9  PROT 7.5 7.0 6.6  ALBUMIN 3.2* 3.0* 2.7*   CBC: Recent Labs  Lab 07/23/22 2244 07/23/22 2252 07/24/22 0120 07/24/22 1221 07/25/22 0158 07/26/22 0244  WBC 12.6*  --  11.9* 8.9 7.0 5.6  NEUTROABS 11.3*  --   --   --   --   --   HGB 12.1   < > 11.8* 11.0* 11.4* 10.0*  HCT 36.9   < > 36.1 32.7* 35.3* 30.2*  MCV 86.0  --  86.4 84.7 86.1 85.6  PLT 252  --  260 246 254 249   < > = values in this  interval not displayed.   Cardiac Enzymes: Recent Labs  Lab 07/23/22 2244  CKTOTAL 1,948*   CBG: Recent Labs  Lab 07/23/22 2239 07/26/22 0854  GLUCAP 137* 78    Studies/Results: CARDIAC CATHETERIZATION  Result Date: 07/25/2022 Minor coronary irregularities. No obstructive disease Plan: medical management.    Medications:  sodium chloride 20 mL/hr at 07/26/22 1029   [MAR Hold] cefTRIAXone (ROCEPHIN)  IV 1 g (07/25/22 2148)   [MAR Hold] vancomycin      [MAR Hold] atorvastatin  40 mg Oral Daily   [MAR Hold] Chlorhexidine Gluconate Cloth  6 each Topical Q0600   [MAR Hold] cinacalcet  90 mg Oral Q M,W,F-HD   [MAR Hold] isosorbide-hydrALAZINE  1 tablet Oral TID   [MAR Hold] metoprolol tartrate  12.5 mg Oral BID   [MAR Hold] pantoprazole  40 mg Oral Daily   [MAR Hold] polyethylene glycol  17 g Oral Daily   [MAR Hold] sucroferric oxyhydroxide  500 mg Oral TID WC    Dialysis Orders: [facility]  4h  400/500   93kg   2/2 bath  LUA AVF  Heparin 4000+ 2039midrun - last HD 3/15, post wt was 92.9kg - venofer 100mg  tiw IV thru 3/25 - sensipar 90 mg po tiw - mircera 30 mcg IV q 2 wks, last 3/1, due 3/15 but held due to Hb 11.1     Assessment/ Plan: NSTEMI/Acute systolic HF- elevated troponin's, getting IV heparin. Cardiac cath yesterday with no CAD. Wall motion consistent with Takotsubo CM. TEE today. Per cards  Aortic valve vegetation - noted on TTE, plan for TEE tomorrow. BC+ GPR on vanc. PNA - CXR w/ R sided infiltrates. On ABX.  ESRD - on HD MWF. HD today per regular schedule, plan for after TEE.  HTN - BP in goal.  On metoprolol. Amlodipine stopped, BiDil started per cardiology.   Volume - possible edema by CXR vs other, up 2 L. Coughing and lung exam w/ diffuse rhonchi more c/w PNA. Net UF goal 3L yesterday, under dry weight post.  Continue UF as tolerated and lower dry on dc if indicated.  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  Cont sensipar and binders.  SP fall   [facility] 07/26/2022,10:46 AM  LOS: 2 days

## 2022-07-26 NOTE — Anesthesia Procedure Notes (Signed)
Procedure Name: MAC Date/Time: 07/26/2022 10:35 AM  Performed by: Darletta Moll, CRNAPre-anesthesia Checklist: Patient identified, Emergency Drugs available, Suction available and Patient being monitored Patient Re-evaluated:Patient Re-evaluated prior to induction Oxygen Delivery Method: Nasal cannula

## 2022-07-26 NOTE — Hospital Course (Addendum)
[name] is a 62 y.o.female with a history of ESRD on HD MWF, HTN, T2DM, CVA, paroxysmal VT, HLD, GERD  who was admitted to the [facility] Medicine Teaching Service at [facility] for NSTEMI in the setting acute bacterial endocarditis. Her hospital course is detailed below:   AMS/Fall Initially presented after fall and altered mental status. AMS and fall thought to be multifactorial in the setting of missed dialysis session, and multiple acute medical problems below. Mental status resolved after resuming dialysis. Further complicated by stroke course below. Patient evaluated by physical and occupational therapy and recommended inpatient rehab.  Multifocal Pneumonia Suspicion of multifocal pneumonia seen on chest x ray in ED. Clinically supported by patient's cough. Patient was started on Azithromycin and Ceftriaxone. Blood cultures were drawn. Patient completed three day course of Azithromycin. See remaining ID course below. Patient remain stable on room air through discharge.  NSTEMI Patient presented with chest pain and troponin elevated to peak of 6242 and then down-trended. Patient started on heparin gtt. Cardiology was consulted. EKG did not show significant changes from prior. ECHO demonstrated apical hypokinesis, EF 35-40%, and concern for vegetation on aortic valve. Cardiac catheterization demonstrated no obstructive findings. Patient switched to subcutaneous heparin.   Acute bacterial endocarditis Vancomycin added to antibiotic regimen after ECHO resulted. 1/4 initial blood cultures positive for staph species though to be contaminant. 2/4 initial blood cultures positive for corynebacterium. Cardiology ordered TEE to further characterize vegetation seen on ECHO. TEE showed recovered EF 60-65%, mobile mass attached to tricuspid annulus suggestive of vegetation, and mitral annulus calcification with central hypoechogenicity. 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 CT Coronary morph  inconclusive for abscess versus caseating MAC. Recommended FDG PET CT. Infectious disease was consulted, ceftriaxone stopped, Vancomycin continued. Cardiothoracic Surgery consulted for possible surgical intervention, but patient not surgical candidate in setting of stroke and other medical problems below. Patient discharged on Vancomycin MWF during dialysis sessions for total antibiotic treatment course of 6 weeks with end date 09/05/2022.  Embolic Stroke On [id number] patient with acute light sided weakness and aphasia concerning for CVA. Code stroke initiated and Neurology consulted but patient was not a candidate for TNK. MRI revealed multiple scattered acute infarcts in bilateral hemispheres and pons likely septic emboli from acute endocarditis. Subsequently underwent stroke rehab with PT/OT/SLP and continued on ASA.   ESRD HD MWF, renal function worse from baseline upon admission but no reported only missing dialysis session on day of admission. Nephrology consulted and managed HD during hospitalization.  Sternal edema Patient noted to have non-erythematous, mild edema of right sternal at level of 2nd-4th ribs, with reproducible chest well tenderness. Chest x-ray showed no bony abnormality. CT chest showed no acute abnormalities, resolving opacities and solitary pulmonary nodule to follow up with outpatient.  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 Follow up imaging in 1-2 years.  FDG PET CT. Cardiology follow-up Neurology follow-up. 2 mm right solid pulmonary nodule within the upper lobe. Consider a non-contrast Chest CT at 12 months  Continue to discuss [place] outpatient, patient has poor prognosis long term. Was seen by palliative while inpatient.

## 2022-07-26 NOTE — Interval H&P Note (Signed)
History and Physical Interval Note:  07/26/2022 9:56 AM  [name]  has presented today for surgery, with the diagnosis of AORTIC VALVE VEGETATION.  The various methods of treatment have been discussed with the patient and family. After consideration of risks, benefits and other options for treatment, the patient has consented to  Procedure(s): TRANSESOPHAGEAL ECHOCARDIOGRAM (TEE) (N/A) as a surgical intervention.  The patient's history has been reviewed, patient examined, no change in status, stable for surgery.  I have reviewed the patient's chart and labs.  Questions were answered to the patient's satisfaction.     Prescott Truex

## 2022-07-26 NOTE — Anesthesia Preprocedure Evaluation (Addendum)
Anesthesia Evaluation  Patient identified by MRN, date of birth, ID band Patient awake    Reviewed: Allergy & Precautions, NPO status , Patient's Chart, lab work & pertinent test results  History of Anesthesia Complications Negative for: history of anesthetic complications  Airway Mallampati: II  TM Distance: >3 FB Neck ROM: Full    Dental  (+) Dental Advisory Given   Pulmonary sleep apnea , former smoker   Pulmonary exam normal        Cardiovascular hypertension, Pt. on medications + Past MI and +CHF  Normal cardiovascular exam+ dysrhythmias Ventricular Tachycardia + Valvular Problems/Murmurs    '24 TTE - EF 35 to 40%. The left ventricle demonstrates regional wall motion abnormalities with anteroseptal and peri-apical hypokinesis. There is mild concentric left ventricular hypertrophy. Grade I diastolic dysfunction (impaired relaxation). There is mildly elevated pulmonary artery systolic pressure. The estimated right ventricular systolic pressure is 0000000 mmHg. Left atrial size was mildly dilated. Trivial mitral valve  regurgitation. Mild mitral stenosis. The mean mitral valve gradient is 5.0 mmHg. Tricuspid valve regurgitation is mild to moderate. Small (0.9 cm) mobile vegetation attached to the aortic side of the aortic valve near the annulus. This was only seen in the parasternal short axis view. Would consider TEE for full evaluation. Aortic valve regurgitation is not visualized. No aortic stenosis is present.      Neuro/Psych CVA, No Residual Symptoms  negative psych ROS   GI/Hepatic Neg liver ROS,GERD  Medicated and Controlled,,  Endo/Other  diabetes    Renal/GU ESRF and DialysisRenal disease     Musculoskeletal  (+) Arthritis ,    Abdominal   Peds  Hematology  (+) Blood dyscrasia, anemia   Anesthesia Other Findings   Reproductive/Obstetrics                             Anesthesia  Physical Anesthesia Plan  ASA: 4  Anesthesia Plan: MAC   Post-op Pain Management:    Induction:   PONV Risk Score and Plan: 2 and Propofol infusion and Treatment may vary due to age or medical condition  Airway Management Planned: Nasal Cannula and Natural Airway  Additional Equipment: None  Intra-op Plan:   Post-operative Plan:   Informed Consent: I have reviewed the patients History and Physical, chart, labs and discussed the procedure including the risks, benefits and alternatives for the proposed anesthesia with the patient or authorized representative who has indicated his/her understanding and acceptance.       Plan Discussed with: CRNA and Anesthesiologist  Anesthesia Plan Comments:         Anesthesia Quick Evaluation

## 2022-07-26 NOTE — Progress Notes (Signed)
     Daily Progress Note Intern Pager: [phone]  Patient name: [name] Medical record number: [id number] Date of birth: 1960-12-31 Age: 62 y.o. Gender: female  Primary Care Provider: Lowry Ram, MD Consultants: Cardiology Code Status: Full Code  Pt Overview and Major Events to Date:  -Admitted 07/24/22  Assessment and Plan: [name] is a 62 y.o. female admitted for NSTEMI in the setting of multifocal pneumonia. Pertinent PMH/PSH includes ESRD on HD MWF, HTN, DM, OSA, HLD, Hx CVA.   * NSTEMI (non-ST elevated myocardial infarction) (HCC) VSS. Cardiac catheterization reassuringly without obstructive disease. TEE today. -Consult Cardiology, appreciate recs -Heparin per pharmacy -ASA 325 mg -Nitroglycerin 0.4 mg sublingual prn -Vitals per unit -Cardiac Telemetry  -PT/OT eval and treat -Continue Metoprolol  Multifocal pneumonia Continue to be afebrile, and remains stable on RA with mild tachypnea. WBC 5.6. Continue treatment of CAP, 2/4 Blood cultures positive for gram positive rods. Concern for bacteremia/endocarditis. TEE today. -Continue Ceftriaxone -Stop Azithromycin -Vancomycin per pharmacy consult -Follow-up speciation and sensitivities -AM CBC, BMP -Repeat bcx   Unwitnessed fall Likely multifactorial in the setting of sepsis, and uremia. -Will treat sepsis and uremia as above -Consider orthostatics -Left foot XR  AMS (altered mental status) Improved with dialysis. -Delirium precautions   -Scheduled HD -Pneumonia treatment as above  ESRD on dialysis ([place]) HD on MWF. -Nephrology consulted recs, appreciated -Electrolyte management per dialysis  -Velphoro, Sensipar  Type 2 diabetes mellitus without complications (HCC) Hg 123456 5.4. Hx of DM per chart review. Patient not on DM medications -CBG checks w/ BMP   FEN/GI: NPO, diet after TEE today PPx: Heparin Dispo: Pending clinical improvement  Subjective:  NAEO. Patient states she is doing  better. No chest pain. Abdominal pain has improved. Still has cough.  Objective: Temp:  [97.7 F (36.5 C)-98 F (36.7 C)] 97.7 F (36.5 C) (03/22 0750) Pulse Rate:  [81-92] 81 (03/22 0750) Resp:  [17-34] 20 (03/22 0750) BP: (110-140)/(57-88) 134/61 (03/22 0750) SpO2:  [100 %] 100 % (03/22 0750) Physical Exam: General: NAD, A&Ox4 Cardiovascular: RRR, no murmurs, no peripheral edema Respiratory: normal WOB on RA, bilateral rhonchi Abdomen: soft, TTP diffusely, no rebound or guarding Extremities: Moving all 4 extremities equally  Left foot: no obvious deformity, erythema, or swelling, mild TTP head of 5th metatarsal, bilateral posterior malleoli non tender, 5/5 plantar and dorsiflexion, ROM wnl  Laboratory: Most recent CBC Lab Results  Component Value Date   WBC 5.6 07/26/2022   HGB 10.0 (L) 07/26/2022   HCT 30.2 (L) 07/26/2022   MCV 85.6 07/26/2022   PLT 249 07/26/2022   Most recent BMP    Latest Ref Rng & Units 07/26/2022    2:44 AM  BMP  Glucose 70 - 99 mg/dL 65   BUN 8 - 23 mg/dL 56   Creatinine 0.44 - 1.00 mg/dL 10.28   Sodium 135 - 145 mmol/L 136   Potassium 3.5 - 5.1 mmol/L 3.7   Chloride 98 - 111 mmol/L 96   CO2 22 - 32 mmol/L 24   Calcium 8.9 - 10.3 mg/dL 8.3     Other pertinent labs: none  Imaging/Diagnostic Tests: No new imaging.  Salvadore Oxford, MD 07/26/2022, 8:16 AM  PGY-1, [place] Intern pager: [phone], text pages welcome Secure chat group [place]

## 2022-07-26 NOTE — Progress Notes (Signed)
Physical Therapy Treatment Patient Details Name: [name] MRN: [id number] DOB: 1960/06/01 Today's Date: 07/26/2022   History of Present Illness 62 y.o. female presents to [facility] hospital on 07/23/2022 with chest pain after being found down at home, found to have NSTEMI and PNA. PMH includes DM, ESRD, GERD, HLD, HTN.    PT Comments    Pt was seen for mobility to get from bed to chair initially, and note that she has not done any walking with PT yet due to testing and generalized weakness.  Pt is heading to endoscopy and will follow up with any mobility that she can tolerate afterward.  Recommending AIR to see if pt can qualify for this level of tx given her many deficits of strength, balance and quality of movement with steps.  Focus on her goals of PT and progress with more standing and time OOB as her testing permits.   Recommendations for follow up therapy are one component of a multi-disciplinary discharge planning process, led by the attending physician.  Recommendations may be updated based on patient status, additional functional criteria and insurance authorization.  Follow Up Recommendations  Acute inpatient rehab (3hours/day) Can patient physically be transported by private vehicle: No   Assistance Recommended at Discharge Frequent or constant Supervision/Assistance  Patient can return home with the following Two people to help with walking and/or transfers;A lot of help with bathing/dressing/bathroom;Direct supervision/assist for medications management;Direct supervision/assist for financial management;Assistance with cooking/housework;Assist for transportation;Help with stairs or ramp for entrance   Equipment Recommendations  Rolling walker (2 wheels);BSC/3in1    Recommendations for Other Services       Precautions / Restrictions Precautions Precautions: Fall Precaution Comments: watch dizziness Restrictions Weight Bearing Restrictions: No     Mobility  Bed  Mobility Overal bed mobility: Needs Assistance Bed Mobility: Supine to Sit     Supine to sit: Mod assist, HOB elevated          Transfers Overall transfer level: Needs assistance Equipment used: Rolling walker (2 wheels), 2 person hand held assist Transfers: Sit to/from Stand, Bed to chair/wheelchair/BSC Sit to Stand: Mod assist, +2 physical assistance, +2 safety/equipment   Step pivot transfers: Mod assist, +2 physical assistance, +2 safety/equipment       General transfer comment: no complaints of dizziness and no nystagmus    Ambulation/Gait               General Gait Details: deferred   Stairs             Wheelchair Mobility    Modified Rankin (Stroke Patients Only)       Balance Overall balance assessment: Needs assistance Sitting-balance support: Feet supported Sitting balance-Leahy Scale: Fair Sitting balance - Comments: able to sit independently once up Postural control: Posterior lean, Right lateral lean Standing balance support: Bilateral upper extremity supported, During functional activity Standing balance-Leahy Scale: Poor                              Cognition Arousal/Alertness: Awake/alert Behavior During Therapy: Flat affect Overall Cognitive Status: Impaired/Different from baseline Area of Impairment: Safety/judgement, Following commands, Attention, Problem solving                   Current Attention Level: Selective   Following Commands: Follows one step commands with increased time Safety/Judgement: Decreased awareness of deficits, Decreased awareness of safety Awareness: Intellectual Problem Solving: Slow processing, Decreased initiation, Difficulty sequencing, Requires verbal  cues, Requires tactile cues General Comments: pt is up to side of bed to get to recliner, and transport arrives for endoscopy.  Mod assist to get to chair of two        Exercises      General Comments General comments (skin  integrity, edema, etc.): Pt is up to move with help on standing and support of weakness to step. Reluctant to move and requires encouragemetn      Pertinent Vitals/Pain Pain Assessment Pain Assessment: No/denies pain    Home Living                          Prior Function            PT Goals (current goals can now be found in the care plan section) Acute Rehab PT Goals Patient Stated Goal: get home and stronger    Frequency    Min 3X/week      PT Plan Current plan remains appropriate    Co-evaluation              AM-PAC PT "6 Clicks" Mobility   Outcome Measure  Help needed turning from your back to your side while in a flat bed without using bedrails?: A Lot Help needed moving from lying on your back to sitting on the side of a flat bed without using bedrails?: A Lot Help needed moving to and from a bed to a chair (including a wheelchair)?: Total Help needed standing up from a chair using your arms (e.g., wheelchair or bedside chair)?: A Lot Help needed to walk in hospital room?: Total Help needed climbing 3-5 steps with a railing? : Total 6 Click Score: 9    End of Session Equipment Utilized During Treatment: Gait belt Activity Tolerance: Patient limited by fatigue;Treatment limited secondary to medical complications (Comment) Patient left: in chair;with nursing/sitter in room;Other (comment) (going to procedure so purwick left in place) Nurse Communication: Mobility status PT Visit Diagnosis: Other abnormalities of gait and mobility (R26.89);Muscle weakness (generalized) (M62.81)     Time: [id number] PT Time Calculation (min) (ACUTE ONLY): 16 min  Charges:  $Therapeutic Activity: 8-22 mins        Ramond Dial 07/26/2022, 12:02 PM  Mee Hives, PT PhD Acute Rehab Dept. Number: [place] [facility] [place]

## 2022-07-26 NOTE — TOC Benefit Eligibility Note (Signed)
Patient Teacher, English as a foreign language completed.    The patient is currently admitted and upon discharge could be taking isosorbide-hydralazine (Bidil) 20-37.5 mg tablet.  The current 30 day co-pay is $0.00.   The patient is insured through [employer] Part D   This test claim was processed through [facility] amounts may vary at other pharmacies due to pharmacy/plan contracts, or as the patient moves through the different stages of their insurance plan.  Lyndel Safe, [place] Patient Advocate Specialist [place] Patient Advocate Team Direct Number: [phone]  Fax: [phone]

## 2022-07-26 NOTE — Progress Notes (Signed)
FMTS Interim Progress Note  Spoke with ID on-call Dr. 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 Confirmed currently on CTX and Vancomycin for coverage. States the team will see the patient in the AM.   Colletta Maryland, MD 07/26/2022, 6:41 PM PGY-1, [place] [facility] Service pager [phone]

## 2022-07-26 NOTE — Anesthesia Postprocedure Evaluation (Signed)
Anesthesia Post Note  Patient: [name]  Procedure(s) Performed: TRANSESOPHAGEAL ECHOCARDIOGRAM (TEE)     Patient location during evaluation: PACU Anesthesia Type: MAC Level of consciousness: awake and alert Pain management: pain level controlled Vital Signs Assessment: post-procedure vital signs reviewed and stable Respiratory status: spontaneous breathing, nonlabored ventilation and respiratory function stable Cardiovascular status: stable and blood pressure returned to baseline Anesthetic complications: no   No notable events documented.  Last Vitals:  Vitals:   07/26/22 1105 07/26/22 1115  BP: (!) 99/49 111/65  Pulse: 86 83  Resp: 20 19  Temp:    SpO2: 98% 98%    Last Pain:  Vitals:   07/26/22 1115  TempSrc:   PainSc: 0-No pain                 Audry Pili

## 2022-07-26 NOTE — Progress Notes (Signed)
  Echocardiogram Echocardiogram Transesophageal has been performed.  [name] 07/26/2022, 11:04 AM

## 2022-07-26 NOTE — Op Note (Signed)
INDICATIONS: endocarditis  PROCEDURE:   Informed consent was obtained prior to the procedure. The risks, benefits and alternatives for the procedure were discussed and the patient comprehended these risks.  Risks include, but are not limited to, cough, sore throat, vomiting, nausea, somnolence, esophageal and stomach trauma or perforation, bleeding, low blood pressure, aspiration, pneumonia, infection, trauma to the teeth and death.    After a procedural time-out, the oropharynx was anesthetized with 20% benzocaine spray.   During this procedure the patient was administered IV propofol by Anesthesiology, Dr. Fransisco Beau.   The transesophageal probe was inserted in the esophagus and stomach without difficulty and multiple views were obtained.  The patient was kept under observation until the patient left the procedure room.  The patient left the procedure room in stable condition.   Agitated microbubble saline contrast was not administered.  COMPLICATIONS:    There were no immediate complications.  FINDINGS:   Left ventricular function and wall motion is now normal. LVEF 60%. There is at least moderate LVH. There is heavy mitral annulus calcification with central hypoechogenicity, suggesting abscess formation or caseous necrosis. The ventricular surface of the mitral annulus is hypermobile, suggesting structural undermining, but there is no flow in the mitral annulus echo-free space. There is a highly mobile echodensity on the tricuspid annulus, in the direct vicinity aof the aortic annulus, consistent with a vegetation (this is the structure that was identified as a possible aortic annulus vegetation on the transthoracic echo). 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 There is moderate TR. There is no AR. There is mild MR. 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 Consider cardiac MRI.  Time Spent Directly with the Patient:  45 minutes   [name] 07/26/2022, 10:57 AM

## 2022-07-26 NOTE — Transfer of Care (Signed)
Immediate Anesthesia Transfer of Care Note  Patient: [name]  Procedure(s) Performed: TRANSESOPHAGEAL ECHOCARDIOGRAM (TEE)  Patient Location: Endoscopy Unit  Anesthesia Type:MAC  Level of Consciousness: drowsy  Airway & Oxygen Therapy: Patient Spontanous Breathing and Patient connected to nasal cannula oxygen  Post-op Assessment: Report given to RN, Post -op Vital signs reviewed and stable, and Patient moving all extremities X 4  Post vital signs: Reviewed and stable  Last Vitals:  Vitals Value Taken Time  BP 94/50   Temp    Pulse 86   Resp 22   SpO2 96     Last Pain:  Vitals:   07/26/22 1010  TempSrc: Temporal  PainSc: 0-No pain      Patients Stated Pain Goal: 0 ([id number])  Complications: No notable events documented.

## 2022-07-26 NOTE — Progress Notes (Addendum)
Rounding Note    Patient Name: [name] Date of Encounter: 07/26/2022  [place] Cardiologist: None   Subjective   Feeling much better this morning. Planned for TEE today.   Inpatient Medications    Scheduled Meds:  amLODipine  10 mg Oral Daily   aspirin EC  81 mg Oral Daily   atorvastatin  40 mg Oral Daily   Chlorhexidine Gluconate Cloth  6 each Topical Q0600   cinacalcet  90 mg Oral Q M,W,F-HD   metoprolol tartrate  12.5 mg Oral BID   pantoprazole  40 mg Oral Daily   polyethylene glycol  17 g Oral Daily   sucroferric oxyhydroxide  500 mg Oral TID WC   Continuous Infusions:  cefTRIAXone (ROCEPHIN)  IV 1 g (07/25/22 2148)   vancomycin     PRN Meds: acetaminophen, nitroGLYCERIN   Vital Signs    Vitals:   07/25/22 2030 07/25/22 2352 07/26/22 0544 07/26/22 0750  BP: (!) 121/57 (!) 110/58 132/66 134/61  Pulse: 91 83 81 81  Resp: 18 (!) 22 20 20   Temp: 97.8 F (36.6 C) 97.8 F (36.6 C) 98 F (36.7 C) 97.7 F (36.5 C)  TempSrc: Oral Oral Oral Oral  SpO2: 100% 100% 100% 100%  Weight:      Height:        Intake/Output Summary (Last 24 hours) at 07/26/2022 0757 Last data filed at 07/25/2022 1800 Gross per 24 hour  Intake 111.86 ml  Output --  Net 111.86 ml      07/24/2022    6:03 PM 07/24/2022    2:00 PM 07/23/2022   11:37 PM  Last 3 Weights  Weight (lbs) 203 lb 0.7 oz 208 lb 8.9 oz 210 lb  Weight (kg) 92.1 kg 94.6 kg 95.255 kg      Telemetry    Sinus Rhythm, deep TWI - Personally Reviewed  ECG    No new tracing this morning  Physical Exam   GEN: No acute distress.   Neck: No JVD Cardiac: RRR, no murmurs, rubs, or gallops.  Respiratory: Clear to auscultation bilaterally. GI: Soft, nontender, non-distended  MS: No edema; No deformity. Right femoral cath site stable.  Neuro:  Nonfocal  Psych: Normal affect   Labs    High Sensitivity Troponin:   Recent Labs  Lab 07/23/22 2244 07/24/22 0120 07/24/22 0733 07/24/22 1221  07/24/22 1502  TROPONINIHS 2,155* 1,948* 6,242* 4,206* 4,276*     Chemistry Recent Labs  Lab 07/23/22 2244 07/23/22 2252 07/24/22 0120 07/25/22 0158 07/26/22 0244  NA 137   < > 139 136 136  K 4.4   < > 4.9 4.0 3.7  CL 94*   < > 96* 96* 96*  CO2 21*  --  23 23 24   GLUCOSE 136*   < > 134* 70 65*  BUN 64*   < > 64* 38* 56*  CREATININE 13.99*   < > 14.02* 8.52* 10.28*  CALCIUM 9.0  --  8.8* 8.4* 8.3*  MG 2.6*  --   --   --   --   PROT 7.5  --  7.0 6.6  --   ALBUMIN 3.2*  --  3.0* 2.7*  --   AST 44*  --  48* 47*  --   ALT 19  --  17 20  --   ALKPHOS 57  --  51 49  --   BILITOT 0.8  --  0.6 0.9  --   GFRNONAA 3*  --  3* 5* 4*  ANIONGAP 22*  --  20* 17* 16*   < > = values in this interval not displayed.    Lipids  Recent Labs  Lab 07/25/22 0158  CHOL 198  TRIG 133  HDL 38*  LDLCALC 133*  CHOLHDL 5.2    Hematology Recent Labs  Lab 07/24/22 1221 07/25/22 0158 07/26/22 0244  WBC 8.9 7.0 5.6  RBC 3.86* 4.10 3.53*  HGB 11.0* 11.4* 10.0*  HCT 32.7* 35.3* 30.2*  MCV 84.7 86.1 85.6  MCH 28.5 27.8 28.3  MCHC 33.6 32.3 33.1  RDW 14.7 14.6 14.7  PLT 246 254 249   Thyroid No results for input(s): "TSH", "FREET4" in the last 168 hours.  BNPNo results for input(s): "BNP", "PROBNP" in the last 168 hours.  DDimer No results for input(s): "DDIMER" in the last 168 hours.   Radiology    CARDIAC CATHETERIZATION  Result Date: 07/25/2022 Minor coronary irregularities. No obstructive disease Plan: medical management.   DG Abd 1 View  Result Date: 07/24/2022 CLINICAL DATA:  Epigastric pain over the last 3 days.  Emesis. EXAM: ABDOMEN - 1 VIEW COMPARISON:  CT 03/13/2022 FINDINGS: Bowel gas pattern does not show evidence of ileus, obstruction or visible free air. Surgical clips present in the right upper quadrant consistent with previous cholecystectomy. 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 Patchy  density in the lung bases as seen on yesterday's chest radiography. IMPRESSION: No acute radiographic finding. No evidence of ileus or obstruction. Previous cholecystectomy. 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 Electronically Signed   By: Nelson Chimes M.D.   On: 07/24/2022 09:39   ECHOCARDIOGRAM COMPLETE  Result Date: 07/24/2022    ECHOCARDIOGRAM REPORT   Patient Name:   [name] Date of Exam: 07/24/2022 Medical Rec #:  [id number]    Height:       70.0 in Accession #:    [id number]   Weight:       210.0 lb Date of Birth:  [birth year]    BSA:          2.131 m Patient Age:    62 years     BP:           155/74 mmHg Patient Gender: F            HR:           93 bpm. Exam Location:  Inpatient Procedure: 2D Echo, Cardiac Doppler and Color Doppler Indications:    NSTEMI  History:        Patient has prior history of Echocardiogram examinations.  Sonographer:    NA Referring Phys: [place]  1. Left ventricular ejection fraction, by estimation, is 35 to 40%. The left ventricle has moderately decreased function. The left ventricle demonstrates regional wall motion abnormalities with anteroseptal and peri-apical hypokinesis. There is mild concentric left ventricular hypertrophy. Left ventricular diastolic parameters are consistent with Grade I diastolic dysfunction (impaired relaxation).  2. Right ventricular systolic function is normal. The right ventricular size is normal. There is mildly elevated pulmonary artery systolic pressure. The estimated right ventricular systolic pressure is 0000000 mmHg.  3. Left atrial size was mildly dilated.  4. The mitral valve is normal in structure. Trivial mitral valve regurgitation. Mild mitral stenosis. The mean mitral valve gradient is 5.0 mmHg. Moderate mitral annular calcification.  5. Tricuspid valve regurgitation is mild to moderate.  6. Small (  0.9 cm) mobile vegetation attached to the aortic side of the aortic valve near  the annulus. This was only seen in the parasternal short axis view. Would consider TEE for full evaluation. The aortic valve is tricuspid. There is mild calcification of the aortic valve. Aortic valve regurgitation is not visualized. No aortic stenosis is present.  7. The inferior vena cava is normal in size with greater than 50% respiratory variability, suggesting right atrial pressure of 3 mmHg. FINDINGS  Left Ventricle: Left ventricular ejection fraction, by estimation, is 35 to 40%. The left ventricle has moderately decreased function. The left ventricle demonstrates regional wall motion abnormalities. The left ventricular internal cavity size was normal in size. There is mild concentric left ventricular hypertrophy. Left ventricular diastolic parameters are consistent with Grade I diastolic dysfunction (impaired relaxation). Right Ventricle: The right ventricular size is normal. No increase in right ventricular wall thickness. Right ventricular systolic function is normal. There is mildly elevated pulmonary artery systolic pressure. The tricuspid regurgitant velocity is 3.24  m/s, and with an assumed right atrial pressure of 3 mmHg, the estimated right ventricular systolic pressure is 0000000 mmHg. Left Atrium: Left atrial size was mildly dilated. Right Atrium: Right atrial size was normal in size. Pericardium: Trivial pericardial effusion is present. Mitral Valve: The mitral valve is normal in structure. There is mild calcification of the mitral valve leaflet(s). Moderate mitral annular calcification. Trivial mitral valve regurgitation. Mild mitral valve stenosis. MV peak gradient, 8.8 mmHg. The mean  mitral valve gradient is 5.0 mmHg. Tricuspid Valve: The tricuspid valve is normal in structure. Tricuspid valve regurgitation is mild to moderate. Aortic Valve: Small (0.9 cm) mobile vegetation attached to the aortic side of the aortic valve near the annulus. The aortic valve is tricuspid. There is mild  calcification of the aortic valve. Aortic valve regurgitation is not visualized. No aortic stenosis is present. Aortic valve mean gradient measures 2.5 mmHg. Aortic valve peak gradient measures 4.9 mmHg. Aortic valve area, by VTI measures 2.92 cm. Pulmonic Valve: The pulmonic valve was normal in structure. Pulmonic valve regurgitation is not visualized. Aorta: The aortic root is normal in size and structure. Venous: The inferior vena cava is normal in size with greater than 50% respiratory variability, suggesting right atrial pressure of 3 mmHg. IAS/Shunts: No atrial level shunt detected by color flow Doppler.  LEFT VENTRICLE PLAX 2D LVIDd:         4.90 cm   Diastology LVIDs:         3.70 cm   LV e' medial:    3.08 cm/s LV PW:         1.30 cm   LV E/e' medial:  31.0 LV IVS:        1.40 cm   LV e' lateral:   4.95 cm/s LVOT diam:     2.00 cm   LV E/e' lateral: 19.3 LV SV:         51 LV SV Index:   24 LVOT Area:     3.14 cm  RIGHT VENTRICLE             IVC RV Basal diam:  3.60 cm     IVC diam: 1.30 cm RV S prime:     13.60 cm/s TAPSE (M-mode): 2.1 cm LEFT ATRIUM           Index        RIGHT ATRIUM           Index LA diam:  3.90 cm 1.83 cm/m   RA Area:     13.40 cm LA Vol (A2C): 90.1 ml 42.28 ml/m  RA Volume:   33.05 ml  15.51 ml/m LA Vol (A4C): 51.6 ml 24.21 ml/m  AORTIC VALVE                    PULMONIC VALVE AV Area (Vmax):    2.80 cm     PV Vmax:       0.80 m/s AV Area (Vmean):   2.81 cm     PV Peak grad:  2.5 mmHg AV Area (VTI):     2.92 cm AV Vmax:           111.00 cm/s AV Vmean:          74.450 cm/s AV VTI:            0.173 m AV Peak Grad:      4.9 mmHg AV Mean Grad:      2.5 mmHg LVOT Vmax:         99.00 cm/s LVOT Vmean:        66.500 cm/s LVOT VTI:          0.161 m LVOT/AV VTI ratio: 0.93  AORTA Ao Root diam: 2.70 cm Ao Asc diam:  3.20 cm Ao Arch diam: 2.9 cm MITRAL VALVE                TRICUSPID VALVE MV Area (PHT): 10.11 cm    TR Peak grad:   42.0 mmHg MV Area VTI:   1.97 cm     TR Vmax:         324.00 cm/s MV Peak grad:  8.8 mmHg MV Mean grad:  5.0 mmHg     SHUNTS MV Vmax:       1.48 m/s     Systemic VTI:  0.16 m MV Vmean:      98.1 cm/s    Systemic Diam: 2.00 cm MV Decel Time: 75 msec MV E velocity: 95.60 cm/s MV A velocity: 167.00 cm/s MV E/A ratio:  0.57 Dalton McleanMD Electronically signed by Franki Monte Signature Date/Time: 07/24/2022/8:52:18 AM    Final     Cardiac Studies   Echo: 07/24/2022   IMPRESSIONS     1. Left ventricular ejection fraction, by estimation, is 35 to 40%. The  left ventricle has moderately decreased function. The left ventricle  demonstrates regional wall motion abnormalities with anteroseptal and  peri-apical hypokinesis. There is mild  concentric left ventricular hypertrophy. Left ventricular diastolic  parameters are consistent with Grade I diastolic dysfunction (impaired  relaxation).   2. Right ventricular systolic function is normal. The right ventricular  size is normal. There is mildly elevated pulmonary artery systolic  pressure. The estimated right ventricular systolic pressure is 0000000 mmHg.   3. Left atrial size was mildly dilated.   4. The mitral valve is normal in structure. Trivial mitral valve  regurgitation. Mild mitral stenosis. The mean mitral valve gradient is 5.0  mmHg. Moderate mitral annular calcification.   5. Tricuspid valve regurgitation is mild to moderate.   6. Small (0.9 cm) mobile vegetation attached to the aortic side of the  aortic valve near the annulus. This was only seen in the parasternal short  axis view. Would consider TEE for full evaluation. The aortic valve is  tricuspid. There is mild  calcification of the aortic valve. Aortic valve regurgitation is not  visualized. No aortic stenosis is present.   7.  The inferior vena cava is normal in size with greater than 50%  respiratory variability, suggesting right atrial pressure of 3 mmHg.   FINDINGS   Left Ventricle: Left ventricular ejection fraction, by  estimation, is 35  to 40%. The left ventricle has moderately decreased function. The left  ventricle demonstrates regional wall motion abnormalities. The left  ventricular internal cavity size was  normal in size. There is mild concentric left ventricular hypertrophy.  Left ventricular diastolic parameters are consistent with Grade I  diastolic dysfunction (impaired relaxation).   Right Ventricle: The right ventricular size is normal. No increase in  right ventricular wall thickness. Right ventricular systolic function is  normal. There is mildly elevated pulmonary artery systolic pressure. The  tricuspid regurgitant velocity is 3.24   m/s, and with an assumed right atrial pressure of 3 mmHg, the estimated  right ventricular systolic pressure is 0000000 mmHg.   Left Atrium: Left atrial size was mildly dilated.   Right Atrium: Right atrial size was normal in size.   Pericardium: Trivial pericardial effusion is present.   Mitral Valve: The mitral valve is normal in structure. There is mild  calcification of the mitral valve leaflet(s). Moderate mitral annular  calcification. Trivial mitral valve regurgitation. Mild mitral valve  stenosis. MV peak gradient, 8.8 mmHg. The mean   mitral valve gradient is 5.0 mmHg.   Tricuspid Valve: The tricuspid valve is normal in structure. Tricuspid  valve regurgitation is mild to moderate.   Aortic Valve: Small (0.9 cm) mobile vegetation attached to the aortic side  of the aortic valve near the annulus. The aortic valve is tricuspid. There  is mild calcification of the aortic valve. Aortic valve regurgitation is  not visualized. No aortic  stenosis is present. Aortic valve mean gradient measures 2.5 mmHg. Aortic  valve peak gradient measures 4.9 mmHg. Aortic valve area, by VTI measures  2.92 cm.   Pulmonic Valve: The pulmonic valve was normal in structure. Pulmonic valve  regurgitation is not visualized.   Aorta: The aortic root is normal in  size and structure.   Venous: The inferior vena cava is normal in size with greater than 50%  respiratory variability, suggesting right atrial pressure of 3 mmHg.   IAS/Shunts: No atrial level shunt detected by color flow Doppler.    Cath: 07/25/2022  Minor coronary irregularities. No obstructive disease   Plan: medical management.  Patient Profile     62 y.o. female with a hx of HTN, HLD, DM 2, CVA, ESRD on HD (MWF) who was seen 07/24/2022 for the evaluation of acute acute coronary syndrome at the request of ED.   Assessment & Plan    NSTEMI -- High-sensitivity troponin 2155>> 1948>> 6242.  EKG showed sinus tachycardia with TWI in anterolateral leads. Denies any chest pain PTA or currently. Mostly was abd pain with nausea. Underwent cardiac cath yesterday with no CAD.  -- continue ASA, statin, metoprolol    HFrEF -- Echo showed LVEF of 35 to [id number], grade 1 diastolic dysfunction, normal RV size and function, wall motion abnormalities with anterior septal and periapical hypokinesis. Suspect this is more of a stress cardiomyopathy -- volume management per HD -- GDMT: continue low dose metoprolol, add low dose BiDil. Stop norvasc   Abd pain Nausea -- Complains of centralized and lower abdominal pain, tender with palpation as well as nausea -- Abdominal x-ray with no acute findings, small left renal artery aneurysm -- work up per primary   ERSD on HD -- On  Monday Wednesday Friday schedule -- Nephrology following   PNA -- Chest x-ray with multifocal pneumonia -- Currently on ceftriaxone and vancomycin    AMS Unwitnessed fall -- Mental status seems improved this morning.  Reports she lives at home with her son.  Down on the floor with around 13 hours. CK 1948 -- CT head and neck negative   HLD -- lipid panel pending -- continue Crestor to 20mg  daily   Possible aortic valve vegetation -- noted on TTE, planned for TEE today -- BC with GPR, currently on vancomycin to cover for  potential endocarditis    For questions or updates, please contact [place] Please consult [email or web address] for contact info under        Signed, Reino Bellis, NP  07/26/2022, 7:57 AM    I have examined the patient and reviewed assessment and plan and discussed with patient.  Agree with above as stated.    I discussed the TEE findings and reviewed the TEE with Dr. Sallyanne Kuster.  Concerning mitral valve anatomy with possible abscess vs. Caseous necrosis.  Will have to consider cMRI.  Await blood culture results.   Discussed with imaging team and CT felt to be the best test.  I discussed CT scan to better characterize valve issue with the patient and daughter.  THey are agreeable.  Trying to determine timing of [place]

## 2022-07-26 NOTE — Procedures (Signed)
HD Note:  Some information was entered later than the data was gathered due to patient care needs. The stated time with the data is accurate.  Received patient in bed to unit.  Alert and oriented.  Informed consent signed and in chart.   TX duration:3.5 hours  Patient tolerated well. Did have complaints of itching, medicated, see MAR. Patient became sleepy after the medication.  Transported back to the room  Alert, without acute distress.  Hand-off given to patient's nurse.   Access used: Upper left arm fistula Access issues: None  Total UF removed: 3000 ml     [facility]

## 2022-07-27 ENCOUNTER — Inpatient Hospital Stay (HOSPITAL_COMMUNITY): Payer: 59

## 2022-07-27 DIAGNOSIS — R29898 Other symptoms and signs involving the musculoskeletal system: Secondary | ICD-10-CM | POA: Diagnosis not present

## 2022-07-27 DIAGNOSIS — I330 Acute and subacute infective endocarditis: Secondary | ICD-10-CM | POA: Diagnosis not present

## 2022-07-27 DIAGNOSIS — I059 Rheumatic mitral valve disease, unspecified: Secondary | ICD-10-CM | POA: Diagnosis not present

## 2022-07-27 DIAGNOSIS — N186 End stage renal disease: Secondary | ICD-10-CM | POA: Diagnosis not present

## 2022-07-27 DIAGNOSIS — R4789 Other speech disturbances: Secondary | ICD-10-CM

## 2022-07-27 DIAGNOSIS — I6310 Cerebral infarction due to embolism of unspecified precerebral artery: Secondary | ICD-10-CM

## 2022-07-27 DIAGNOSIS — R2990 Unspecified symptoms and signs involving the nervous system: Secondary | ICD-10-CM | POA: Diagnosis not present

## 2022-07-27 DIAGNOSIS — I2510 Atherosclerotic heart disease of native coronary artery without angina pectoris: Secondary | ICD-10-CM | POA: Diagnosis not present

## 2022-07-27 DIAGNOSIS — R931 Abnormal findings on diagnostic imaging of heart and coronary circulation: Secondary | ICD-10-CM

## 2022-07-27 DIAGNOSIS — R479 Unspecified speech disturbances: Secondary | ICD-10-CM

## 2022-07-27 DIAGNOSIS — I214 Non-ST elevation (NSTEMI) myocardial infarction: Secondary | ICD-10-CM | POA: Diagnosis not present

## 2022-07-27 LAB — BLOOD CULTURE ID PANEL (REFLEXED) - BCID2

## 2022-07-27 LAB — RENAL FUNCTION PANEL
Albumin: 2.8 g/dL — ABNORMAL LOW (ref 3.5–5.0)
Anion gap: 15 (ref 5–15)
BUN: 34 mg/dL — ABNORMAL HIGH (ref 8–23)
CO2: 24 mmol/L (ref 22–32)
Calcium: 8 mg/dL — ABNORMAL LOW (ref 8.9–10.3)
Chloride: 91 mmol/L — ABNORMAL LOW (ref 98–111)
Creatinine, Ser: 7.63 mg/dL — ABNORMAL HIGH (ref 0.44–1.00)
GFR, Estimated: 6 mL/min — ABNORMAL LOW (ref >60)
Glucose, Bld: 164 mg/dL — ABNORMAL HIGH (ref 70–99)
Phosphorus: 5 mg/dL — ABNORMAL HIGH (ref 2.5–4.6)
Potassium: 3.8 mmol/L (ref 3.5–5.1)
Sodium: 130 mmol/L — ABNORMAL LOW (ref 135–145)

## 2022-07-27 LAB — CBC
HCT: 33.8 % — ABNORMAL LOW (ref 36.0–46.0)
Hemoglobin: 11.1 g/dL — ABNORMAL LOW (ref 12.0–15.0)
MCH: 28.2 pg (ref 26.0–34.0)
MCHC: 32.8 g/dL (ref 30.0–36.0)
MCV: 85.8 fL (ref 80.0–100.0)
Platelets: 296 10*3/uL (ref 150–400)
RBC: 3.94 MIL/uL (ref 3.87–5.11)
RDW: 14.6 % (ref 11.5–15.5)
WBC: 8.1 10*3/uL (ref 4.0–10.5)
nRBC: 0 % (ref 0.0–0.2)

## 2022-07-27 LAB — GLUCOSE, CAPILLARY: Glucose-Capillary: 82 mg/dL (ref 70–99)

## 2022-07-27 MED ORDER — MIDAZOLAM HCL 2 MG/2ML IJ SOLN
1.0000 mg | Freq: Once | INTRAMUSCULAR | Status: AC | PRN
  Administered 2022-07-27: 1 mg via INTRAVENOUS
  Filled 2022-07-27: qty 2

## 2022-07-27 MED ORDER — NEPRO/CARBSTEADY PO LIQD
237.0000 mL | Freq: Two times a day (BID) | ORAL | Status: DC
  Administered 2022-07-28 – 2022-08-07 (×14): 237 mL via ORAL

## 2022-07-27 MED ORDER — CALCIUM CARBONATE ANTACID 500 MG PO CHEW
1.0000 | CHEWABLE_TABLET | Freq: Three times a day (TID) | ORAL | Status: DC | PRN
  Filled 2022-07-27 (×3): qty 1
  Filled 2022-07-27: qty 2
  Filled 2022-07-27: qty 1

## 2022-07-27 MED ORDER — LIDOCAINE 5 % EX PTCH
1.0000 | MEDICATED_PATCH | CUTANEOUS | Status: DC
  Administered 2022-07-27 – 2022-08-01 (×6): 1 via TRANSDERMAL
  Filled 2022-07-27 (×6): qty 1

## 2022-07-27 MED ORDER — IOHEXOL 350 MG/ML SOLN
95.0000 mL | Freq: Once | INTRAVENOUS | Status: AC | PRN
  Administered 2022-07-27: 95 mL via INTRAVENOUS

## 2022-07-27 MED ORDER — CAPSAICIN 0.025 % EX CREA
TOPICAL_CREAM | Freq: Two times a day (BID) | CUTANEOUS | Status: DC | PRN
  Filled 2022-07-27: qty 60

## 2022-07-27 MED ORDER — LIDOCAINE VISCOUS HCL 2 % MT SOLN: 15.0000 mL | Freq: Four times a day (QID) | OROMUCOSAL | Status: DC | PRN

## 2022-07-27 MED ORDER — MIDAZOLAM HCL 2 MG/2ML IJ SOLN: 1.0000 mg | Freq: Once | INTRAMUSCULAR | Status: DC

## 2022-07-27 MED ORDER — IOHEXOL 350 MG/ML SOLN
75.0000 mL | Freq: Once | INTRAVENOUS | Status: AC | PRN
  Administered 2022-07-27: 75 mL via INTRAVENOUS

## 2022-07-27 NOTE — Evaluation (Signed)
Speech Language Pathology Evaluation Patient Details Name: [name] MRN: [id number] DOB: January 27, 1961 Today's Date: 07/27/2022 Time: [id number] SLP Time Calculation (min) (ACUTE ONLY): 15 min  Problem List:  Patient Active Problem List   Diagnosis Date Noted   Speech disturbance 07/27/2022   Bilateral leg weakness 07/27/2022   Acute bacterial endocarditis 07/26/2022   NSTEMI (non-ST elevated myocardial infarction) ([place]) 07/24/2022   Multifocal pneumonia 07/24/2022   Suspected stroke 07/27/22 07/24/2022   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   Paroxysmal ventricular tachycardia ([place]) 03/15/2022   Hyperphosphatemia 03/14/2022   ESRD needing dialysis ([place]) 03/13/2022   SIRS (systemic inflammatory response syndrome) ([place]) 03/13/2022   Elevated troponin 03/13/2022   Nausea and vomiting in adult 03/13/2022   Vestibular neuritis 03/10/2022   ESRD on dialysis [facility])    Cerebellar cerebrovascular accident (CVA) without late effect 09/23/2019   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)    Leukocytosis    Stroke ([place]) 09/18/2019   Type 2 diabetes mellitus without complications ([place]) [id number]   Anemia in chronic kidney disease 02/19/2019   ESRD (end stage renal disease) ([place]) 02/19/2019   Secondary hyperparathyroidism of renal origin ([place]) 02/19/2019   Coagulation defect, unspecified ([place]) 02/19/2019   Venous (peripheral) insufficiency 08/06/2017   Abdominal pain 09/22/2015   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 ([place]) 08/21/2015   Diabetic gastroparesis ([place]) 08/09/2015   Obstructive sleep apnea of adult 01/16/2012   Osteoarthritis of both knees 01/16/2012   Hyperlipidemia, unspecified 07/18/2011   LEIOMYOMA, UTERUS 01/14/2007   Morbid obesity ([place]) 07/03/2006   Former smoker 07/03/2006   Past Medical History:  Past Medical History:  Diagnosis Date   Accelerated hypertension    Aortic atherosclerosis (HCC)    Arthritis of knee    bilateral    Diabetes mellitus    type 2 - no meds   Diabetic gastroparesis (HCC)    Diverticulosis    ESRD (end stage renal disease) ([place])    Fibroid uterus    GERD (gastroesophageal reflux disease)    Hyperlipidemia    Hypertension    Hypertensive emergency 02/05/2019   Hypokalemia    IDA (iron deficiency anemia)    Nausea    Nausea and vomiting in adult 03/13/2022   Paroxysmal ventricular tachycardia ([place])    Renal disorder    Umbilical hernia    Wears dentures    Past Surgical History:  Past Surgical History:  Procedure Laterality Date   AV FISTULA PLACEMENT Left 02/17/2019   Procedure: ARTERIOVENOUS (AV) FISTULA CREATION LEFT UPPER ARM;  Surgeon: Waynetta Sandy, MD;  Location: [facility];  Service: Vascular;  Laterality: Left;   CATARACT EXTRACTION     right eye   CESAREAN SECTION     x2   CHOLECYSTECTOMY     laparoscopic   CORONARY ANGIOGRAPHY N/A 07/25/2022   Procedure: CORONARY ANGIOGRAPHY;  Surgeon: [name], [name], MD;  Location: [facility];  Service: Cardiovascular;  Laterality: N/A;   FISTULA SUPERFICIALIZATION Left 04/06/2019   Procedure: FISTULA SUPERFICIALIZATION LEFT BRACHIOCEPHALIC;  Surgeon: Waynetta Sandy, MD;  Location: [facility];  Service: Vascular;  Laterality: Left;  Transposition left arm brachiocephalic fistula.   IR FLUORO GUIDE CV LINE RIGHT  02/09/2019   IR FLUORO GUIDE CV LINE RIGHT  03/05/2019   IR US GUIDE VASC ACCESS RIGHT  02/09/2019   MULTIPLE TOOTH EXTRACTIONS     REDUCTION MAMMAPLASTY Bilateral    TEE WITHOUT CARDIOVERSION N/A 07/26/2022   Procedure: TRANSESOPHAGEAL ECHOCARDIOGRAM (  TEE);  Surgeon: [name], MD;  Location: [facility] ENDOSCOPY;  Service: Cardiovascular;  Laterality: N/A;   HPI:  Patient is a 62 y.o. female with PMH: GERD, HTN, DM, ESRD on HD, HLD. She presented to the hospital from home on 07/24/22 with chest pain after being found down at home following an unwittnessed fall. In ED, she was found to have NSTEMI and  multifocal PNA. She later met sepsis criteria. On 3/23, Code Stroke was called while patient in unit 6E with c/o aphasia, left sided weakness. CT head did not show acute intracranial process but evaluation was limited by contrast in vasculature; MRI brain ordered but has not yet been completed.   Assessment / Plan / Recommendation Clinical Impression  Patient is presenting with a mild-moderate cognitive impairment as per this evaluation. She was slow to respond, had difficulty maintaining attention when answering questions and required frequent cues to redirect. Initiation was delayed and patient would frequently have a confused look on her face, at times looking to family members as if to request help but not adequately communicating what she wanted. She was able to state correct year when given cue "two thousand and......" and when initially stating month, she stated "January" then "February, March June". Patient was able to recount what happened to cause her to be hospitalized only when her sister gave her a context cue. Pragmatically, patient was monotone but at one point did start laughing when talking about her son. SLP is recommending ongoing skilled intervention including therapeutic intervention as well as dynamic assessment.    SLP Assessment  SLP Recommendation/Assessment: Patient needs continued Speech [facility] SLP Visit Diagnosis: Cognitive communication deficit (R41.841)    Recommendations for follow up therapy are one component of a multi-disciplinary discharge planning process, led by the attending physician.  Recommendations may be updated based on patient status, additional functional criteria and insurance authorization.    Follow Up Recommendations  Acute inpatient rehab (3hours/day)    Assistance Recommended at Discharge  Intermittent Supervision/Assistance  Functional Status Assessment Patient has had a recent decline in their functional status and  demonstrates the ability to make significant improvements in function in a reasonable and predictable amount of time.  Frequency and Duration min 1 x/week  2 weeks      SLP Evaluation Cognition  Overall Cognitive Status: Impaired/Different from baseline Arousal/Alertness: Awake/alert Orientation Level: Oriented to person;Oriented to place;Disoriented to time;Disoriented to situation Year: 2023 Month: January Day of Week: Incorrect Attention: Sustained Sustained Attention: Impaired Sustained Attention Impairment: Verbal basic;Functional basic Memory: Impaired Memory Impairment: Storage deficit Awareness: Impaired Awareness Impairment: Intellectual impairment Problem Solving: Impaired Problem Solving Impairment: Verbal basic Executive Function: Initiating Initiating: Impaired Initiating Impairment: Verbal basic;Functional basic       Comprehension  Auditory Comprehension Overall Auditory Comprehension: Impaired Yes/No Questions: Within Functional Limits Commands: Within Functional Limits Conversation: Simple Interfering Components: Attention;Working memory;Processing speed EffectiveTechniques: Media planner: Not tested Reading Comprehension Reading Status: Not tested    Expression Expression Primary Mode of Expression: Verbal Verbal Expression Overall Verbal Expression: Impaired Initiation: Impaired Repetition: No impairment Naming: No impairment Pragmatics: Impairment Impairments: Abnormal affect;Monotone;Topic maintenance Interfering Components: Attention;Speech intelligibility Non-Verbal Means of Communication: Not applicable   Oral / Motor  Oral Motor/Sensory Function Overall Oral Motor/Sensory Function: Within functional limits Motor Speech Overall Motor Speech: Appears within functional limits for tasks assessed Respiration: Within functional limits Resonance: Within functional  limits Articulation: Within functional limitis Intelligibility: Intelligible Motor Planning: Witnin functional limits Motor Speech  Errors: Not applicable Interfering Components: Premorbid status Effective Techniques: Increased vocal intensity            Sonia Baller, MA, CCC-SLP Speech Therapy

## 2022-07-27 NOTE — Progress Notes (Signed)
Rounding Note    Patient Name: [name] Date of Encounter: 07/27/2022  [place] for cardiac CTA now   Inpatient Medications    Scheduled Meds:  aspirin EC  81 mg Oral Daily   atorvastatin  40 mg Oral Daily   Chlorhexidine Gluconate Cloth  6 each Topical Q0600   cinacalcet  90 mg Oral Q M,W,F-HD   heparin injection (subcutaneous)  5,000 Units Subcutaneous Q8H   isosorbide-hydrALAZINE  1 tablet Oral TID   metoprolol tartrate  12.5 mg Oral BID   pantoprazole  40 mg Oral Daily   polyethylene glycol  17 g Oral Daily   sucroferric oxyhydroxide  500 mg Oral TID WC   Continuous Infusions:  cefTRIAXone (ROCEPHIN)  IV Stopped (07/26/22 2252)   vancomycin 1,000 mg (07/26/22 1743)   PRN Meds: acetaminophen, nitroGLYCERIN   Vital Signs    Vitals:   07/26/22 1919 07/27/22 0001 07/27/22 0330 07/27/22 0826  BP: 120/66 (!) 110/50 116/62 (!) 160/58  Pulse: 85 86 84 90  Resp: (!) 26 15 (!) 25 15  Temp: (!) 97.4 F (36.3 C) 97.8 F (36.6 C) 97.9 F (36.6 C) 98 F (36.7 C)  TempSrc: Axillary Oral Oral Oral  SpO2: 100% 99% 98% 97%  Weight:      Height:        Intake/Output Summary (Last 24 hours) at 07/27/2022 0841 Last data filed at 07/27/2022 0400 Gross per 24 hour  Intake 597.18 ml  Output 3000 ml  Net -2402.82 ml      07/24/2022    6:03 PM 07/24/2022    2:00 PM 07/23/2022   11:37 PM  Last 3 Weights  Weight (lbs) 203 lb 0.7 oz 208 lb 8.9 oz 210 lb  Weight (kg) 92.1 kg 94.6 kg 95.255 kg      Telemetry    Sinus Rhythm, deep TWI - Personally Reviewed  ECG    No new tracing this morning  Physical Exam   Chronically ill LUE fistula for dialysis with thrill Bilateral rhonchi SEM Abdomen benign Plus one edema  Labs    High Sensitivity Troponin:   Recent Labs  Lab 07/23/22 2244 07/24/22 0120 07/24/22 0733 07/24/22 1221 07/24/22 1502  TROPONINIHS 2,155* 1,948* 6,242* 4,206* 4,276*      Chemistry Recent Labs  Lab 07/23/22 2244 07/23/22 2252 07/24/22 0120 07/25/22 0158 07/26/22 0244  NA 137   < > 139 136 136  K 4.4   < > 4.9 4.0 3.7  CL 94*   < > 96* 96* 96*  CO2 21*  --  23 23 24   GLUCOSE 136*   < > 134* 70 65*  BUN 64*   < > 64* 38* 56*  CREATININE 13.99*   < > 14.02* 8.52* 10.28*  CALCIUM 9.0  --  8.8* 8.4* 8.3*  MG 2.6*  --   --   --   --   PROT 7.5  --  7.0 6.6  --   ALBUMIN 3.2*  --  3.0* 2.7*  --   AST 44*  --  48* 47*  --   ALT 19  --  17 20  --   ALKPHOS 57  --  51 49  --   BILITOT 0.8  --  0.6 0.9  --   GFRNONAA 3*  --  3* 5* 4*  ANIONGAP 22*  --  20* 17* 16*   < > = values in this interval  not displayed.    Lipids  Recent Labs  Lab 07/25/22 0158  CHOL 198  TRIG 133  HDL 38*  LDLCALC 133*  CHOLHDL 5.2    Hematology Recent Labs  Lab 07/26/22 0244 07/26/22 1414 07/27/22 0126  WBC 5.6 7.4 8.1  RBC 3.53* 3.81* 3.94  HGB 10.0* 10.6* 11.1*  HCT 30.2* 33.1* 33.8*  MCV 85.6 86.9 85.8  MCH 28.3 27.8 28.2  MCHC 33.1 32.0 32.8  RDW 14.7 14.7 14.6  PLT 249 260 296   Thyroid No results for input(s): "TSH", "FREET4" in the last 168 hours.  BNPNo results for input(s): "BNP", "PROBNP" in the last 168 hours.  DDimer No results for input(s): "DDIMER" in the last 168 hours.   Radiology    ECHO TEE  Result Date: 07/26/2022    TRANSESOPHOGEAL ECHO REPORT   Patient Name:   [name] Date of Exam: 07/26/2022 Medical Rec #:  [id number]    Height:       70.0 in Accession #:    [id number]   Weight:       203.0 lb Date of Birth:  1960/12/31    BSA:          2.101 m Patient Age:    36 years     BP:           111/65 mmHg Patient Gender: F            HR:           88 bpm. Exam Location:  Inpatient Procedure: Transesophageal Echo, 3D Echo, Cardiac Doppler and Color Doppler Indications:    Aortic valve vegetation  History:        Patient has prior history of Echocardiogram examinations, most                 recent 07/24/2022. Arrythmias:Tachycardia; Risk                  Factors:Hypertension, Dyslipidemia and Diabetes. ESRD.  Sonographer:    Eartha Inch Referring Phys: [id number] [place]: After discussion of the risks and benefits of a TEE, an informed consent was obtained from the patient. TEE procedure time was 12 minutes. The transesophogeal probe was passed without difficulty through the esophogus of the patient. Imaged were obtained with the patient in a left lateral decubitus position. Sedation performed by different physician. The patient was monitored while under deep sedation. Anesthestetic sedation was provided intravenously by Anesthesiology: 132.89mg  of Propofol. Image quality was good. The patient's vital signs; including heart rate, blood pressure, and oxygen saturation; remained stable throughout the procedure. The patient developed no complications during the procedure.  IMPRESSIONS  1. Left ventricular ejection fraction, by estimation, is 60 to 65%. The left ventricle has normal function. The left ventricle has no regional wall motion abnormalities. There is moderate concentric left ventricular hypertrophy.  2. Right ventricular systolic function is normal. The right ventricular size is normal.  3. Left atrial size was mildly dilated. No left atrial/left atrial appendage thrombus was detected. The LAA emptying velocity was 70 cm/s.  4. There is a mobile mass in the right atrium (described under tricuspid valve).  5. There is exuberant mitral annulus calcification, up to 2 cm in thickness. There is central hypoechogenicity. This could be due to caseous necrosis or annular abscess. In the medial posterior annulus (corresponding to the medial scallop (P3), the MAC appears unusually mobile, suggesting tissue undermining. There is no clear evidence of communication between the  left heart chambers and the mitral annulus cavity. The mitral valve is degenerative. Mild mitral valve regurgitation. Severe mitral annular calcification.  6. There is a  highly mobile mass attached to the right atrial wall at the junction of the tricuspid annulus and the aortic annulus, at the right-most segment of the right fibrous trigone. It measures 11 mm in length and 3 mm in thickness and is highly suggestive of vegetation. The tricuspid valve is abnormal. Tricuspid valve regurgitation is mild to moderate.  7. There is a tiny filamentous mobile echodensity on the aortic noncoronary cusp. This is most likely a Lambl's excrescence, although a vegetation cannot be completely excluded. The large vegetation described on the transthoracic echo is actually attached to the tricuspid annulus and is located in the right atrium. There is no clear evidence of aortic annulus abscess. The aortic valve is normal in structure. Aortic valve regurgitation is not visualized.  8. There is Moderate (Grade III) layered plaque involving the aortic arch and descending aorta. 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 FINDINGS  Left Ventricle: Left ventricular ejection fraction, by estimation, is 60 to 65%. The left ventricle has normal function. The left ventricle has no regional wall motion abnormalities. The left ventricular internal cavity size was normal in size. There is  moderate concentric left ventricular hypertrophy. Right Ventricle: The right ventricular size is normal. No increase in right ventricular wall thickness. Right ventricular systolic function is normal. Left Atrium: Left atrial size was mildly dilated. No left atrial/left atrial appendage thrombus was detected. The LAA emptying velocity was 70 cm/s. Right  Atrium: Right atrial size was normal in size. There is a mobile mass in the right atrium (described under tricuspid valve). Pericardium: There is no evidence of pericardial effusion. Mitral Valve: There is exuberant mitral annulus calcification, up to 2 cm in thickness. There is central hypoechogenicity. This could be due to caseous necrosis or annular abscess. In the medial posterior annulus (corresponding to the medial scallop (P3), the MAC appears unusually mobile, suggesting tissue undermining. There is no clear evidence of communication between the left heart chambers and the mitral annulus cavity. The mitral valve is degenerative in appearance. Severe mitral annular calcification. Mild mitral valve regurgitation. Tricuspid Valve: There is a highly mobile mass attached to the right atrial wall at the junction of the tricuspid annulus and the aortic annulus, at the right-most segment of the right fibrous trigone. It measures 11 mm in length and 3 mm in thickness and is highly suggestive of vegetation. The tricuspid valve is abnormal. Tricuspid valve regurgitation is mild to moderate. Aortic Valve: There is a tiny filamentous mobile echodensity on the aortic noncoronary cusp. This is most likely a Lambl's excrescence, although a vegetation cannot be completely excluded. The large vegetation described on the transthoracic echo is actually attached to the tricuspid annulus and is located in the right atrium. There is no clear evidence of aortic annulus abscess. The aortic valve is normal in structure. Aortic valve regurgitation is not visualized. Pulmonic Valve: The pulmonic valve was normal in structure. Pulmonic valve regurgitation is trivial. Aorta: The aortic root, ascending aorta, aortic arch and descending aorta are all structurally normal, with no evidence of dilitation or obstruction. There  is moderate (Grade III) layered plaque involving the aortic arch and descending aorta. IAS/Shunts: No atrial level  shunt detected by color flow Doppler. There is no evidence of an atrial septal defect. Additional Comments: Spectral Doppler performed. [name] MD Electronically signed by [name] MD Signature Date/Time: 07/26/2022/2:44:38 PM    Final    DG Foot Complete Left  Result Date: 07/26/2022 CLINICAL DATA:  LEFT foot pain EXAM: LEFT FOOT - COMPLETE 3+ VIEW COMPARISON:  None available FINDINGS: Osseous demineralization. Joint spaces preserved. No fracture, dislocation, or bone destruction. IMPRESSION: No acute osseous abnormalities. Electronically Signed   By: Lavonia Dana M.D.   On: 07/26/2022 12:07   CARDIAC CATHETERIZATION  Result Date: 07/25/2022 Minor coronary irregularities. No obstructive disease Plan: medical management.    Cardiac Studies   Echo: 07/24/2022   IMPRESSIONS     1. Left ventricular ejection fraction, by estimation, is 35 to 40%. The  left ventricle has moderately decreased function. The left ventricle  demonstrates regional wall motion abnormalities with anteroseptal and  peri-apical hypokinesis. There is mild  concentric left ventricular hypertrophy. Left ventricular diastolic  parameters are consistent with Grade I diastolic dysfunction (impaired  relaxation).   2. Right ventricular systolic function is normal. The right ventricular  size is normal. There is mildly elevated pulmonary artery systolic  pressure. The estimated right ventricular systolic pressure is 0000000 mmHg.   3. Left atrial size was mildly dilated.   4. The mitral valve is normal in structure. Trivial mitral valve  regurgitation. Mild mitral stenosis. The mean mitral valve gradient is 5.0  mmHg. Moderate mitral annular calcification.   5. Tricuspid valve regurgitation is mild to moderate.   6. Small (0.9 cm) mobile vegetation attached to the aortic side of the  aortic valve near the annulus. This was only seen in the parasternal short  axis view. Would consider TEE for full evaluation. The  aortic valve is  tricuspid. There is mild  calcification of the aortic valve. Aortic valve regurgitation is not  visualized. No aortic stenosis is present.   7. The inferior vena cava is normal in size with greater than 50%  respiratory variability, suggesting right atrial pressure of 3 mmHg.   FINDINGS   Left Ventricle: Left ventricular ejection fraction, by estimation, is 35  to 40%. The left ventricle has moderately decreased function. The left  ventricle demonstrates regional wall motion abnormalities. The left  ventricular internal cavity size was  normal in size. There is mild concentric left ventricular hypertrophy.  Left ventricular diastolic parameters are consistent with Grade I  diastolic dysfunction (impaired relaxation).   Right Ventricle: The right ventricular size is normal. No increase in  right ventricular wall thickness. Right ventricular systolic function is  normal. There is mildly elevated pulmonary artery systolic pressure. The  tricuspid regurgitant velocity is 3.24   m/s, and with an assumed right atrial pressure of 3 mmHg, the estimated  right ventricular systolic pressure is 0000000 mmHg.   Left Atrium: Left atrial size was mildly dilated.   Right Atrium: Right atrial size was normal in size.   Pericardium: Trivial pericardial effusion is present.   Mitral Valve: The mitral valve is normal in structure. There is mild  calcification of the mitral valve leaflet(s). Moderate mitral annular  calcification. Trivial mitral valve regurgitation. Mild mitral valve  stenosis. MV peak gradient, 8.8 mmHg. The mean   mitral valve gradient is 5.0 mmHg.   Tricuspid Valve: The tricuspid valve is normal in structure.  Tricuspid  valve regurgitation is mild to moderate.   Aortic Valve: Small (0.9 cm) mobile vegetation attached to the aortic side  of the aortic valve near the annulus. The aortic valve is tricuspid. There  is mild calcification of the aortic valve. Aortic  valve regurgitation is  not visualized. No aortic  stenosis is present. Aortic valve mean gradient measures 2.5 mmHg. Aortic  valve peak gradient measures 4.9 mmHg. Aortic valve area, by VTI measures  2.92 cm.   Pulmonic Valve: The pulmonic valve was normal in structure. Pulmonic valve  regurgitation is not visualized.   Aorta: The aortic root is normal in size and structure.   Venous: The inferior vena cava is normal in size with greater than 50%  respiratory variability, suggesting right atrial pressure of 3 mmHg.   IAS/Shunts: No atrial level shunt detected by color flow Doppler.    Cath: 07/25/2022  Minor coronary irregularities. No obstructive disease   Plan: medical management.  Patient Profile     62 y.o. female with a hx of HTN, HLD, DM 2, CVA, ESRD on HD (MWF) who was seen 07/24/2022 for the evaluation of acute acute coronary syndrome at the request of ED.   Assessment & Plan    NSTEMI -- High-sensitivity troponin 2155>> 1948>> 6242.  EKG showed sinus tachycardia with TWI in anterolateral leads. Cath 07/25/22 no obstructive CAD    HFrEF -- Echo showed LVEF of 35 to [id number], grade 1 diastolic dysfunction, normal RV size and function, wall motion abnormalities with anterior septal and periapical hypokinesis. Suspect this is more of a stress cardiomyopathy -- volume management per HD -- GDMT: continue low dose metoprolol, add low dose BiDil. Stop norvasc - note TEE suggested EF normalized    Abd pain Nausea -- Complains of centralized and lower abdominal pain, tender with palpation as well as nausea -- Abdominal x-ray with no acute findings, small left renal artery aneurysm -- work up per primary   ERSD on HD -- On Monday Wednesday Friday schedule -- Nephrology following - fistula LUE with thrill    PNA -- Chest x-ray with multifocal pneumonia -- Currently on ceftriaxone and vancomycin    AMS Unwitnessed fall -- Mental status seems improved this morning.  Reports  she lives at home with her son.  Down on the floor with around 13 hours. CK 1948 -- CT head and neck negative   HLD -- lipid panel pending -- continue Crestor to 20mg  daily   ? SBE Abnormal TTE --  TEE 3/22 with mobile mass in RA adjacent to TR/AV annulus  -   Cardiac CTA today to assess Caseating MAC vs abscess of MV -  Don't think cardiac CTA will be very useful in defining infection vs degenerated MAC - FDG PET/CT ideal test but currently not done at [facility]  - BC;s pending    For questions or updates, please contact [place] Please consult [email or web address] for contact info under      Signed, Jenkins Rouge, MD  07/27/2022, 8:41 AM

## 2022-07-27 NOTE — Consult Note (Signed)
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 Corynebacterium overlaps with propionebacterium species and almost never caused endocarditis outside of prosthesis/trauma association. It is usually an indolent process and when found signals a far advanced process with complication requiring surgery, which appears to be likely in her case  This particular species of corynebacterium also is very multidrug resistance. Vancomycin maintains highest percentage of efficacy   Critically, she developed stroke today with left sided weakness. Neurology following  With this new complication and abscess finding on tee, and as mentioned above the chronic nature of the coryne infection, would get CTS on board   Multifocal pna ?embolic from atrial infected clot?  Micro: 3/20 2 of 2 sets 20 minutes apart grew corynebacterium jk 3/22 bcx negative  Imaging: 3/22 tee showed severe/exubertan mv annulous calc with concern of perivalvular abscess. There is also a right atrial wall mass  Plan: Please discuss case with cts She meets criteria for early valve surgery Continue vancomycin Can stop ceftriaxone Discussed with primary team    I spent more than 80 minute reviewing data/chart, and coordinating care and >50% direct face to face time providing counseling/discussing diagnostics/treatment plan with patient   ------------------------------------------------ Principal Problem:   NSTEMI (non-ST elevated myocardial infarction) ([place]) Active Problems:   Type 2 diabetes mellitus without complications ([place])   ESRD (end stage renal disease) ([place])    ESRD on dialysis ([place])   Multifocal pneumonia   Unwitnessed fall   AMS (altered mental status)   Acute bacterial endocarditis    HPI: [name] is a 62 y.o. female esrd on ihd admitted with ftt and fall, found to have bacteremia/mv-?right atrial infected clot endocarditis  She was feeling weak and had chest pain but also suffered a fall and eventually came to admission on 3/19. Family also reports nausea vomiting and cough for the past few days prior to admission  No prosthetics or cardiac devices present No recent admission outside of dialysis visits. She has lue avf  Patient has mild leukocytosis at presentation but no fever otherwise Cxr showed bilateral opacity Bcx coryne Tte --> tee right atrial wall and mv endocarditis concern for perivalvular abscess On vanc/azith/ceftriaxone now just vanc/ceftriaxone  She also had fall so head ct/cspine ct done no obvious acute abnormality  When I saw her today she just coded stroke, which started with expressive aphasia vs dysarthria. She is back in room with family members. The dysarthria is currently better, but on my exam still has left leg weakness  Family History  Problem Relation Age of Onset   Hypertension Mother    Diabetes Mother    Cancer Father        lung   Colon cancer Neg Hx    Stomach cancer Neg Hx    Esophageal cancer Neg Hx     Social History   Tobacco Use   Smoking status: Former    Years: 20    Types: Cigarettes    Quit date: 10/16/2011    Years since quitting: 10.7    Passive exposure: Past  Smokeless tobacco: Never   Tobacco comments:    1 pack will last a week  Vaping Use   Vaping Use: Never used  Substance Use Topics   Alcohol use: No    Alcohol/week: 0.0 standard drinks of alcohol   Drug use: No    Allergies  Allergen Reactions   Lisinopril Anaphylaxis and Other (See Comments)    angioedema   Venofer  [Ferric Oxide]     Other reaction(s): Back Pain   Camellia Swelling and Other (See  Comments)    Angioedema    Jardiance [Empagliflozin] Swelling and Rash    Review of Systems: ROS All Other ROS was negative, except mentioned above   Past Medical History:  Diagnosis Date   Accelerated hypertension    Aortic atherosclerosis (HCC)    Arthritis of knee    bilateral   Diabetes mellitus    type 2 - no meds   Diabetic gastroparesis (HCC)    Diverticulosis    ESRD (end stage renal disease) (HCC)    Fibroid uterus    GERD (gastroesophageal reflux disease)    Hyperlipidemia    Hypertension    Hypertensive emergency 02/05/2019   Hypokalemia    IDA (iron deficiency anemia)    Nausea    Nausea and vomiting in adult 03/13/2022   Paroxysmal ventricular tachycardia (HCC)    Renal disorder    Umbilical hernia    Wears dentures        Scheduled Meds:  aspirin EC  81 mg Oral Daily   atorvastatin  40 mg Oral Daily   Chlorhexidine Gluconate Cloth  6 each Topical Q0600   cinacalcet  90 mg Oral Q M,W,F-HD   heparin injection (subcutaneous)  5,000 Units Subcutaneous Q8H   isosorbide-hydrALAZINE  1 tablet Oral TID   metoprolol tartrate  12.5 mg Oral BID   pantoprazole  40 mg Oral Daily   polyethylene glycol  17 g Oral Daily   sucroferric oxyhydroxide  500 mg Oral TID WC   Continuous Infusions:  cefTRIAXone (ROCEPHIN)  IV Stopped (07/26/22 2252)   vancomycin 1,000 mg (07/26/22 1743)   PRN Meds:.acetaminophen, nitroGLYCERIN   OBJECTIVE: Blood pressure (!) 160/58, pulse 90, temperature 98 F (36.7 C), temperature source Oral, resp. rate 15, height 5\' 10"  (1.778 m), weight 92.1 kg, SpO2 97 %.  Physical Exam  General/constitutional: no distress, pleasant, cooperative HEENT: Normocephalic, PER, Conj Clear, EOMI, Oropharynx clear Neck supple CV: rrr no mrg Lungs: clear to auscultation, normal respiratory effort Abd: Soft, Nontender Ext: no edema -- left upper ext avf with good thrill Skin: No Rash Neuro: words conversant, no prolonged sentences. Left leg  paresis. Can't do pronator drift test lying in bed  MSK: no peripheral joint swelling/tenderness/warmth; back spines nontender     Lab Results Lab Results  Component Value Date   WBC 8.1 07/27/2022   HGB 11.1 (L) 07/27/2022   HCT 33.8 (L) 07/27/2022   MCV 85.8 07/27/2022   PLT 296 07/27/2022    Lab Results  Component Value Date   CREATININE 10.28 (H) 07/26/2022   BUN 56 (H) 07/26/2022   NA 136 07/26/2022   K 3.7 07/26/2022   CL 96 (L) 07/26/2022   CO2 24 07/26/2022    Lab Results  Component Value Date   ALT 20 07/25/2022   AST 47 (H) 07/25/2022   ALKPHOS 49 07/25/2022   BILITOT 0.9 07/25/2022      Microbiology: Recent Results (from the past 240 hour(s))  Resp panel by  RT-PCR (RSV, Flu A&B, Covid) Anterior Nasal Swab     Status: None   Collection Time: 07/23/22 10:44 PM   Specimen: Anterior Nasal Swab  Result Value Ref Range Status   SARS Coronavirus 2 by RT PCR NEGATIVE NEGATIVE Final   Influenza A by PCR NEGATIVE NEGATIVE Final   Influenza B by PCR NEGATIVE NEGATIVE Final    Comment: (NOTE) The Xpert Xpress SARS-CoV-2/FLU/RSV plus assay is intended as an aid in the diagnosis of influenza from Nasopharyngeal swab specimens and should not be used as a sole basis for treatment. Nasal washings and aspirates are unacceptable for Xpert Xpress SARS-CoV-2/FLU/RSV testing.  Fact Sheet for Patients: EntrepreneurPulse.com.au  Fact Sheet for Healthcare Providers: IncredibleEmployment.be  This test is not yet approved or cleared by the [place] FDA and has been authorized for detection and/or diagnosis of SARS-CoV-2 by FDA under an Emergency Use Authorization (EUA). This EUA will remain in effect (meaning this test can be used) for the duration of the COVID-19 declaration under Section 564(b)(1) of the Act, 21 U.S.C. section 360bbb-3(b)(1), unless the authorization is terminated or revoked.     Resp Syncytial Virus by PCR  NEGATIVE NEGATIVE Final    Comment: (NOTE) Fact Sheet for Patients: EntrepreneurPulse.com.au  Fact Sheet for Healthcare Providers: IncredibleEmployment.be  This test is not yet approved or cleared by the [place] FDA and has been authorized for detection and/or diagnosis of SARS-CoV-2 by FDA under an Emergency Use Authorization (EUA). This EUA will remain in effect (meaning this test can be used) for the duration of the COVID-19 declaration under Section 564(b)(1) of the Act, 21 U.S.C. section 360bbb-3(b)(1), unless the authorization is terminated or revoked.  Performed at [facility], [place] [address] [place], [place] [address]   Blood culture (routine x 2)     Status: Abnormal (Preliminary result)   Collection Time: 07/24/22 12:10 AM   Specimen: BLOOD  Result Value Ref Range Status   Specimen Description BLOOD SITE NOT SPECIFIED  Final   Special Requests   Final    BOTTLES DRAWN AEROBIC AND ANAEROBIC Blood Culture adequate volume   Culture  Setup Time   Final    GRAM POSITIVE RODS AEROBIC BOTTLE ONLY CRITICAL RESULT CALLED TO, READ BACK BY AND VERIFIED WITH: PHARMD [place] [id number] FCP    Culture (A)  Final    CORYNEBACTERIUM, GROUP JK Standardized susceptibility testing for this organism is not available. CULTURE REINCUBATED FOR BETTER GROWTH Performed at [facility], [place] [address], [place], [place] [address]    Report Status PENDING  Incomplete  Blood culture (routine x 2)     Status: Abnormal (Preliminary result)   Collection Time: 07/24/22 12:30 AM   Specimen: BLOOD  Result Value Ref Range Status   Specimen Description BLOOD SITE NOT SPECIFIED  Final   Special Requests   Final    BOTTLES DRAWN AEROBIC AND ANAEROBIC Blood Culture adequate volume   Culture  Setup Time   Final    GRAM POSITIVE RODS AEROBIC BOTTLE ONLY CRITICAL RESULT CALLED TO, READ BACK BY AND VERIFIED WITH: PHARMD JEREMY FRENS ON 07/25/22  @ 1436 BY DRT GRAM POSITIVE COCCI ANAEROBIC BOTTLE ONLY CRITICAL RESULT CALLED TO, READ BACK BY AND VERIFIED WITH: PHARMD J. LEDFORD 07/27/22 @ 0259 BY AB Performed at [facility], [place] [address] [place], [place] [address]    Culture (A)  Final    CORYNEBACTERIUM, GROUP JK Standardized susceptibility testing for this organism  is not available. GRAM POSITIVE COCCI    Report Status PENDING  Incomplete  Blood Culture ID Panel (Reflexed)     Status: Abnormal   Collection Time: 07/24/22 12:30 AM  Result Value Ref Range Status   Enterococcus faecalis NOT DETECTED NOT DETECTED Final   Enterococcus Faecium NOT DETECTED NOT DETECTED Final   Listeria monocytogenes NOT DETECTED NOT DETECTED Final   Staphylococcus species DETECTED (A) NOT DETECTED Final    Comment: CRITICAL RESULT CALLED TO, READ BACK BY AND VERIFIED WITH: PHARMD J. LEDFORD 07/27/22 @ 0259 BY AB    Staphylococcus aureus (BCID) NOT DETECTED NOT DETECTED Final   Staphylococcus epidermidis NOT DETECTED NOT DETECTED Final   Staphylococcus lugdunensis NOT DETECTED NOT DETECTED Final   Streptococcus species NOT DETECTED NOT DETECTED Final   Streptococcus agalactiae NOT DETECTED NOT DETECTED Final   Streptococcus pneumoniae NOT DETECTED NOT DETECTED Final   Streptococcus pyogenes NOT DETECTED NOT DETECTED Final   A.calcoaceticus-baumannii NOT DETECTED NOT DETECTED Final   Bacteroides fragilis NOT DETECTED NOT DETECTED Final   Enterobacterales NOT DETECTED NOT DETECTED Final   Enterobacter cloacae complex NOT DETECTED NOT DETECTED Final   Escherichia coli NOT DETECTED NOT DETECTED Final   Klebsiella aerogenes NOT DETECTED NOT DETECTED Final   Klebsiella oxytoca NOT DETECTED NOT DETECTED Final   Klebsiella pneumoniae NOT DETECTED NOT DETECTED Final   Proteus species NOT DETECTED NOT DETECTED Final   Salmonella species NOT DETECTED NOT DETECTED Final   Serratia marcescens NOT DETECTED NOT DETECTED Final   Haemophilus  influenzae NOT DETECTED NOT DETECTED Final   Neisseria meningitidis NOT DETECTED NOT DETECTED Final   Pseudomonas aeruginosa NOT DETECTED NOT DETECTED Final   Stenotrophomonas maltophilia NOT DETECTED NOT DETECTED Final   Candida albicans NOT DETECTED NOT DETECTED Final   Candida auris NOT DETECTED NOT DETECTED Final   Candida glabrata NOT DETECTED NOT DETECTED Final   Candida krusei NOT DETECTED NOT DETECTED Final   Candida parapsilosis NOT DETECTED NOT DETECTED Final   Candida tropicalis NOT DETECTED NOT DETECTED Final   Cryptococcus neoformans/gattii NOT DETECTED NOT DETECTED Final    Comment: Performed at [facility], [address], [place], [place] [address]  MRSA Next Gen by PCR, Nasal     Status: None   Collection Time: 07/24/22 11:14 AM   Specimen: Nasal Mucosa; Nasal Swab  Result Value Ref Range Status   MRSA by PCR Next Gen NOT DETECTED NOT DETECTED Final    Comment: (NOTE) The GeneXpert MRSA Assay (FDA approved for NASAL specimens only), is one component of a comprehensive MRSA colonization surveillance program. It is not intended to diagnose MRSA infection nor to guide or monitor treatment for MRSA infections. Test performance is not FDA approved in patients less than 72 years old. Performed at [facility], [place] [address], [place], [place] [address]   Culture, blood (Routine X 2) w Reflex to ID Panel     Status: None (Preliminary result)   Collection Time: 07/26/22  8:35 AM   Specimen: BLOOD  Result Value Ref Range Status   Specimen Description BLOOD SITE NOT SPECIFIED  Final   Special Requests   Final    BOTTLES DRAWN AEROBIC AND ANAEROBIC Blood Culture adequate volume   Culture   Final    NO GROWTH 1 DAY Performed at [facility], [address], [place], [place] [address]    Report Status PENDING  Incomplete  Culture, blood (Routine X 2) w Reflex to  ID Panel     Status: None (Preliminary result)   Collection Time: 07/26/22  8:40 AM    Specimen: BLOOD  Result Value Ref Range Status   Specimen Description BLOOD SITE NOT SPECIFIED  Final   Special Requests   Final    BOTTLES DRAWN AEROBIC AND ANAEROBIC Blood Culture adequate volume   Culture   Final    NO GROWTH 1 DAY Performed at [facility], [place] [address] [place], [place] [address]    Report Status PENDING  Incomplete     Serology:    Imaging: If present, new imagings (plain films, ct scans, and mri) have been personally visualized and interpreted; radiology reports have been reviewed. Decision making incorporated into the Impression / Recommendations.  3/22 tee  1. Left ventricular ejection fraction, by estimation, is 60 to 65%. The left ventricle has normal function. The left ventricle has no regional wall motion abnormalities. There is moderate concentric left ventricular hypertrophy.   2. Right ventricular systolic function is normal. The right ventricular size is normal.   3. Left atrial size was mildly dilated. No left atrial/left atrial  appendage thrombus was detected. The LAA emptying velocity was 70 cm/s.   4. There is a mobile mass in the right atrium (described under tricuspid valve).   5. There is exuberant mitral annulus calcification, up to 2 cm in thickness. There is central hypoechogenicity. This could be due to caseous necrosis or annular abscess. In the medial posterior annulus (corresponding to the medial scallop (P3), the MAC appears unusually mobile, suggesting tissue undermining. There is no clear evidence of communication between the left heart chambers and the mitral annulus cavity. The mitral valve is degenerative. Mild mitral valve regurgitation. Severe mitral annular calcification.   6. There is a highly mobile mass attached to the right atrial wall at the junction of the tricuspid annulus and the aortic annulus, at the right-most segment of the right fibrous trigone. It measures 11 mm in length and 3 mm in thickness and is highly  suggestive of vegetation. The tricuspid valve is abnormal. Tricuspid valve regurgitation is mild to moderate.   7. There is a tiny filamentous mobile echodensity on the aortic  noncoronary cusp. This is most likely a Lambl's excrescence, although a vegetation cannot be completely excluded. The large vegetation described on the transthoracic echo is actually attached to the tricuspid annulus and is located in the right atrium. There is no clear evidence of aortic annulus abscess. The aortic valve is normal in structure. Aortic valve regurgitation is not visualized.   8. There is Moderate (Grade III) layered plaque involving the aortic arch and descending aorta.   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    3/19 ct head/cspine IMPRESSION: CT head:   1. No acute intracranial abnormality. 2. Advanced chronic microvascular ischemic changes of the white matter.   CT cervical spine:   1. No evidence of cervical spine fracture or traumatic subluxation. 2. 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 3. Right lung apex opacity measuring 0.9 x 0.5 cm partially imaged, which may represent pneumonia. Follow-up examination to resolution is recommended.  Jabier Mutton, [place] [phone] pager    07/27/2022, 11:17 AM

## 2022-07-27 NOTE — Significant Event (Signed)
Significant event - Late entry  Code stroke called approx 10:45 am. MD entered room shortly before, found patient to be slightly altered, giving delayed one-word answers, left arm weakness, inability to raise either leg.  Oriented to self, [facility] hospital, and city.  Needed to look at calendar on the wall for month and year.  When asked if this was normal speech for her, patient was able to shake her head and say no.  When asked if she was having difficulty getting her words out, she was able to nod her head and said yes.  RN came into room and confirmed that patient was last known normal at 8:30 AM, prior to her going to a cardiac CT.  Code stroke was called.  Rapid response RN came to room quickly, followed by stroke team NP.  This MD accompanied patient down to CT, provided report to neurologist.  Please see neuro and rapid response notes for further information.   Ezequiel Essex, MD

## 2022-07-27 NOTE — Progress Notes (Signed)
PHARMACY - PHYSICIAN COMMUNICATION CRITICAL VALUE ALERT - BLOOD CULTURE IDENTIFICATION (BCID)  [name] is an 62 y.o. female who presented to [facility] on 07/23/2022  Name of physician (or Provider) Contacted: Dr. Sabra Heck  Current antibiotics: Vancomycin, Ceftriaxone   Changes to prescribed antibiotics recommended:  No changes ID to see today for ?vegetation   Results for orders placed or performed during the hospital encounter of 07/23/22  Blood Culture ID Panel (Reflexed) (Collected: 07/24/2022 12:30 AM)  Result Value Ref Range   Enterococcus faecalis NOT DETECTED NOT DETECTED   Enterococcus Faecium NOT DETECTED NOT DETECTED   Listeria monocytogenes NOT DETECTED NOT DETECTED   Staphylococcus species DETECTED (A) NOT DETECTED   Staphylococcus aureus (BCID) NOT DETECTED NOT DETECTED   Staphylococcus epidermidis NOT DETECTED NOT DETECTED   Staphylococcus lugdunensis NOT DETECTED NOT DETECTED   Streptococcus species NOT DETECTED NOT DETECTED   Streptococcus agalactiae NOT DETECTED NOT DETECTED   Streptococcus pneumoniae NOT DETECTED NOT DETECTED   Streptococcus pyogenes NOT DETECTED NOT DETECTED   A.calcoaceticus-baumannii NOT DETECTED NOT DETECTED   Bacteroides fragilis NOT DETECTED NOT DETECTED   Enterobacterales NOT DETECTED NOT DETECTED   Enterobacter cloacae complex NOT DETECTED NOT DETECTED   Escherichia coli NOT DETECTED NOT DETECTED   Klebsiella aerogenes NOT DETECTED NOT DETECTED   Klebsiella oxytoca NOT DETECTED NOT DETECTED   Klebsiella pneumoniae NOT DETECTED NOT DETECTED   Proteus species NOT DETECTED NOT DETECTED   Salmonella species NOT DETECTED NOT DETECTED   Serratia marcescens NOT DETECTED NOT DETECTED   Haemophilus influenzae NOT DETECTED NOT DETECTED   Neisseria meningitidis NOT DETECTED NOT DETECTED   Pseudomonas aeruginosa NOT DETECTED NOT DETECTED   Stenotrophomonas maltophilia NOT DETECTED NOT DETECTED   Candida albicans NOT DETECTED NOT DETECTED    Candida auris NOT DETECTED NOT DETECTED   Candida glabrata NOT DETECTED NOT DETECTED   Candida krusei NOT DETECTED NOT DETECTED   Candida parapsilosis NOT DETECTED NOT DETECTED   Candida tropicalis NOT DETECTED NOT DETECTED   Cryptococcus neoformans/gattii NOT DETECTED NOT DETECTED    Narda Bonds, PharmD, BCPS Clinical Pharmacist Phone: [phone]

## 2022-07-27 NOTE — Progress Notes (Signed)
Daily Progress Note Intern Pager: [phone] LATE ENTRY  Patient name: [name] Medical record number: [id number] Date of birth: May 18, 1960 Age: 62 y.o. Gender: female  Primary Care Provider: Lowry Ram, MD Consultants: Cardiology, nephrology, ID Code Status: FULL  Pt Overview and Major Events to Date:  3/20 Admitted 3/21 [place] 3/22 TEE 3/23 CT coronary morph, CODE STROKE  Assessment and Plan: 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 Work up ongoing.   * Acute bacterial endocarditis Blood cultures staph growth in one bottle, Corynebacterium group JK growth in 2 bottles. TEE showed exuberant mitral annulus calcification (caseous necrosis vs annular abscess) and likely tricuspid vegetation. CT cardiac morphology shows heterogenous ground-glass in both lungs, CAD RADS 2 (non-obstructive CAD), complex mass post mitral annulus, small mass at base of septal TV leaflet. Cardiology recommends FDG PET CT to distinguish degenerative disease vs inflammation, however this scan is not available at [facility]. ID recommends consultation of CT surgery, will await cardiology recommendations Sunday 3/24.  - ID consulted, patient recommendations - Cardiology consulted, appreciate care of this patient - Hold consult of CT surgery at this time, will discuss with cardiology - Continue Rocephin 1 g every 24 - Continue Vancomycin 1000 mg MWF - Continue to follow original and repeat blood cultures  NSTEMI (non-ST elevated myocardial infarction) ([place]) NSTEMI resolving, with troponin peaked and now leveling.  See acute bacterial endocarditis problem for more information on current cardiac issues. - Continue aspirin daily - Subcu heparin for 5,000 units Q8  ESRD on dialysis ([place]) HD on  MWF. -Nephrology consulted recs, appreciated -Electrolyte management per dialysis  -Velphoro, Sensipar  Type 2 diabetes mellitus without complications (HCC) Hg 123456 5.4. Hx of DM per chart review. Patient not on DM medications -CBG checks w/ BMP  Suspected stroke 07/27/22 Code stroke called 3/23.  High suspicion given acute onset of symptoms and known endocarditis with tricuspid vegetation. Thus far CT head without hemorrhage, CTA head and neck without large vessel occlusion.  Awaiting MRI.  Stroke team on board.  Thankfully, patient's symptoms seem to be improving. - Appreciate ongoing care by stroke team - Consult SLP for swallow eval and also cognition/language eval   Multifocal pneumonia Afebrile, VSS, normal SpO2 on room air. Blood cultures staph growth in one bottle,Corynebacterium group JK growth in 2 bottles.  - ID consulted, appreciate recommendations - Continue ceftriaxone and vancomycin - Continue to follow original and repeat blood cultures     FEN/GI: NPO until cleared by SLP PPx: asa, subcu heparin Dispo:Pending PT recommendations  pending clinical improvement . Barriers include continued work up.   Subjective:  Found patient to be slightly altered, initially quite sleepy. Once awake, found to be giving delayed one-word answers, left arm weakness, inability to raise either leg.  Oriented to self, [facility] hospital, and city.  Needed to look at calendar on the wall for month and year.  When asked if this was normal speech for her, patient was able to shake her head and say no.  When asked if she was having difficulty getting her words out, she was able to nod her head and said yes. CODE STROKE initiated.   Objective: Temp:  [97.4 F (36.3 C)-99 F (37.2 C)] 98.3 F (36.8 C) (03/23 1715) Pulse Rate:  [82-92] 87 (03/23  1715) Resp:  [11-26] 22 (03/23 1715) BP: (104-175)/(49-78) 110/55 (03/23 1715) SpO2:  [93 %-100 %] 99 % (03/23 1715)  Physical Exam: General: awake,  alert, difficulty with answering questions Cardiovascular: RRR, no murmur Respiratory: CTAB Abdomen: soft, non-distended Neuro: Oriented to person, hospital, city; diminished strength of left arm (4/5 grip, push, pull compared to 5/5 of right); patient unable to lift either leg to command  Laboratory: Most recent CBC Lab Results  Component Value Date   WBC 8.1 07/27/2022   HGB 11.1 (L) 07/27/2022   HCT 33.8 (L) 07/27/2022   MCV 85.8 07/27/2022   PLT 296 07/27/2022   Most recent BMP    Latest Ref Rng & Units 07/27/2022    3:05 PM  BMP  Glucose 70 - 99 mg/dL 164   BUN 8 - 23 mg/dL 34   Creatinine 0.44 - 1.00 mg/dL 7.63   Sodium 135 - 145 mmol/L 130   Potassium 3.5 - 5.1 mmol/L 3.8   Chloride 98 - 111 mmol/L 91   CO2 22 - 32 mmol/L 24   Calcium 8.9 - 10.3 mg/dL 8.0    Imaging/Diagnostic Tests: None last 24 hours.  Ezequiel Essex, MD 07/27/2022, 6:00 PM  PGY-3, [place] Intern pager: [phone], text pages welcome Secure chat group [facility] Teaching Mitral valveWith possibleService   caseous necrosis v is ongoing.  Ersus annular abscess.

## 2022-07-27 NOTE — Progress Notes (Signed)
Subjective: Seen and examined in room, earlier had cardiac CTA, results pending, and evidently recently back in room had code stroke activated "With new neuro findings with aphasia and left-sided weakness".  Currently tells me she thinks her speech is getting better,:" but not back to normal" tolerated dialysis yesterday without difficulty.  3 L UF  Objective Vital signs in last 24 hours: Vitals:   07/26/22 1919 07/27/22 0001 07/27/22 0330 07/27/22 0826  BP: 120/66 (!) 110/50 116/62 (!) 160/58  Pulse: 85 86 84 90  Resp: (!) 26 15 (!) 25 15  Temp: (!) 97.4 F (36.3 C) 97.8 F (36.6 C) 97.9 F (36.6 C) 98 F (36.7 C)  TempSrc: Axillary Oral Oral Oral  SpO2: 100% 99% 98% 97%  Weight:      Height:       Weight change:   Physical Exam: General: Alert elderly female NAD Heart: RRR, SEM Lungs: CTA bilaterally nonlabored breathing Abdomen: NABS soft NT ND Extremities: No pedal edema Dialysis Access: LUA AVF+ bruit  OP dialysis Orders: MWF [place]  4h  400/500   93kg   2/2 bath  LUA AVF  Heparin 4000+ 2078midrun - last HD 3/15, post wt was 92.9kg - venofer 100mg  tiw IV thru 3/25 - sensipar 90 mg po tiw - mircera 30 mcg IV q 2 wks, last 3/1, due 3/15 but held due to Hb 11.1     Problem/Plan: NSTEMI/Acute systolic HF-cardiology evaluating ,elevated troponin's, getting IV heparin. Cardiac cath 3/21 with no CAD. Wall motion consistent with Takotsubo CM. TEE = concern for  perivalvular absces and right atrial wall mass /cardiac CTA, per cards  Aortic valve vegetation - noted on TTE, TEE as above possible abscess ID now following corynebacterium jk species BC+ , on vanc. PNA - CXR w/ R sided infiltrates. On ABX.  ESRD - on HD MWF. per regular schedule,  HTN - BP in goal.  On metoprolol. Amlodipine stopped, BiDil started per cardiology.   Volume - possible edema by CXR vs other, up 2 L.  Yesterday coughing and lung exam w/ diffuse rhonchi more c/w PNA.  Today more clear net UF goal 3L  yesterday, under dry weight post.  Continue UF as tolerated and lower dry on dc if indicated.  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 Cont sensipar and binders.  No acute ischemic infarct in setting of endocarditis = neurology evaluating stroke team following    Ernest Haber, PA-C [place] [phone] 07/27/2022,8:37 AM  LOS: 3 days   Labs: Basic Metabolic Panel: Recent Labs  Lab 07/24/22 0120 07/24/22 1502 07/25/22 0158 07/26/22 0244  NA 139  --  136 136  K 4.9  --  4.0 3.7  CL 96*  --  96* 96*  CO2 23  --  23 24  GLUCOSE 134*  --  70 65*  BUN 64*  --  38* 56*  CREATININE 14.02*  --  8.52* 10.28*  CALCIUM 8.8*  --  8.4* 8.3*  PHOS  --  6.8*  --   --    Liver Function Tests: Recent Labs  Lab 07/23/22 2244 07/24/22 0120 07/25/22 0158  AST 44* 48* 47*  ALT 19 17 20   ALKPHOS 57 51 49  BILITOT 0.8 0.6 0.9  PROT 7.5 7.0 6.6  ALBUMIN 3.2* 3.0* 2.7*   No results for input(s): "LIPASE", "AMYLASE" in the last 168 hours. No results for  input(s): "AMMONIA" in the last 168 hours. CBC: Recent Labs  Lab 07/23/22 2244 07/23/22 2252 07/24/22 1221 07/25/22 0158 07/26/22 0244 07/26/22 1414 07/27/22 0126  WBC 12.6*   < > 8.9 7.0 5.6 7.4 8.1  NEUTROABS 11.3*  --   --   --   --   --   --   HGB 12.1   < > 11.0* 11.4* 10.0* 10.6* 11.1*  HCT 36.9   < > 32.7* 35.3* 30.2* 33.1* 33.8*  MCV 86.0   < > 84.7 86.1 85.6 86.9 85.8  PLT 252   < > 246 254 249 260 296   < > = values in this interval not displayed.   Cardiac Enzymes: Recent Labs  Lab 07/23/22 2244  CKTOTAL 1,948*   CBG: Recent Labs  Lab 07/23/22 2239 07/26/22 0854  GLUCAP 137* 78    Studies/Results: ECHO TEE  Result Date: 07/26/2022    TRANSESOPHOGEAL ECHO REPORT   Patient Name:   [name] Date of Exam: 07/26/2022 Medical Rec #:  [id number]    Height:       70.0 in Accession #:    [id number]   Weight:       203.0 lb  Date of Birth:  Dec 10, 1960    BSA:          2.101 m Patient Age:    62 years     BP:           111/65 mmHg Patient Gender: F            HR:           88 bpm. Exam Location:  Inpatient Procedure: Transesophageal Echo, 3D Echo, Cardiac Doppler and Color Doppler Indications:    Aortic valve vegetation  History:        Patient has prior history of Echocardiogram examinations, most                 recent 07/24/2022. Arrythmias:Tachycardia; Risk                 Factors:Hypertension, Dyslipidemia and Diabetes. ESRD.  Sonographer:    Eartha Inch Referring Phys: [id number] [place]: After discussion of the risks and benefits of a TEE, an informed consent was obtained from the patient. TEE procedure time was 12 minutes. The transesophogeal probe was passed without difficulty through the esophogus of the patient. Imaged were obtained with the patient in a left lateral decubitus position. Sedation performed by different physician. The patient was monitored while under deep sedation. Anesthestetic sedation was provided intravenously by Anesthesiology: 132.89mg  of Propofol. Image quality was good. The patient's vital signs; including heart rate, blood pressure, and oxygen saturation; remained stable throughout the procedure. The patient developed no complications during the procedure.  IMPRESSIONS  1. Left ventricular ejection fraction, by estimation, is 60 to 65%. The left ventricle has normal function. The left ventricle has no regional wall motion abnormalities. There is moderate concentric left ventricular hypertrophy.  2. Right ventricular systolic function is normal. The right ventricular size is normal.  3. Left atrial size was mildly dilated. No left atrial/left atrial appendage thrombus was detected. The LAA emptying velocity was 70 cm/s.  4. There is a mobile mass in the right atrium (described under tricuspid valve).  5. There is exuberant mitral annulus calcification, up to 2 cm in thickness. There is  central hypoechogenicity. This could be due to caseous necrosis or annular abscess. In the medial posterior annulus (corresponding to the  medial scallop (P3), the MAC appears unusually mobile, suggesting tissue undermining. There is no clear evidence of communication between the left heart chambers and the mitral annulus cavity. The mitral valve is degenerative. Mild mitral valve regurgitation. Severe mitral annular calcification.  6. There is a highly mobile mass attached to the right atrial wall at the junction of the tricuspid annulus and the aortic annulus, at the right-most segment of the right fibrous trigone. It measures 11 mm in length and 3 mm in thickness and is highly suggestive of vegetation. The tricuspid valve is abnormal. Tricuspid valve regurgitation is mild to moderate.  7. There is a tiny filamentous mobile echodensity on the aortic noncoronary cusp. This is most likely a Lambl's excrescence, although a vegetation cannot be completely excluded. The large vegetation described on the transthoracic echo is actually attached to the tricuspid annulus and is located in the right atrium. There is no clear evidence of aortic annulus abscess. The aortic valve is normal in structure. Aortic valve regurgitation is not visualized.  8. There is Moderate (Grade III) layered plaque involving the aortic arch and descending aorta. 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 FINDINGS  Left Ventricle: Left ventricular ejection fraction, by estimation, is 60 to 65%. The left ventricle has normal function. The left ventricle has no regional wall  motion abnormalities. The left ventricular internal cavity size was normal in size. There is  moderate concentric left ventricular hypertrophy. Right Ventricle: The right ventricular size is normal. No increase in right ventricular wall thickness. Right ventricular systolic function is normal. Left Atrium: Left atrial size was mildly dilated. No left atrial/left atrial appendage thrombus was detected. The LAA emptying velocity was 70 cm/s. Right Atrium: Right atrial size was normal in size. There is a mobile mass in the right atrium (described under tricuspid valve). Pericardium: There is no evidence of pericardial effusion. Mitral Valve: There is exuberant mitral annulus calcification, up to 2 cm in thickness. There is central hypoechogenicity. This could be due to caseous necrosis or annular abscess. In the medial posterior annulus (corresponding to the medial scallop (P3), the MAC appears unusually mobile, suggesting tissue undermining. There is no clear evidence of communication between the left heart chambers and the mitral annulus cavity. The mitral valve is degenerative in appearance. Severe mitral annular calcification. Mild mitral valve regurgitation. Tricuspid Valve: There is a highly mobile mass attached to the right atrial wall at the junction of the tricuspid annulus and the aortic annulus, at the right-most segment of the right fibrous trigone. It measures 11 mm in length and 3 mm in thickness and is highly suggestive of vegetation. The tricuspid valve is abnormal. Tricuspid valve regurgitation is mild to moderate. Aortic Valve: There is a tiny filamentous mobile echodensity on the aortic noncoronary cusp. This is most likely a Lambl's excrescence, although a vegetation cannot be completely excluded. The large vegetation described on the transthoracic echo is actually attached to the tricuspid annulus and is located in the right atrium. There is no clear evidence of aortic annulus abscess. The aortic  valve is normal in structure. Aortic valve regurgitation is not visualized. Pulmonic Valve: The pulmonic valve was normal in structure. Pulmonic valve regurgitation is trivial. Aorta: The aortic  root, ascending aorta, aortic arch and descending aorta are all structurally normal, with no evidence of dilitation or obstruction. There is moderate (Grade III) layered plaque involving the aortic arch and descending aorta. IAS/Shunts: No atrial level shunt detected by color flow Doppler. There is no evidence of an atrial septal defect. Additional Comments: Spectral Doppler performed. [name] MD Electronically signed by [name] MD Signature Date/Time: 07/26/2022/2:44:38 PM    Final    DG Foot Complete Left  Result Date: 07/26/2022 CLINICAL DATA:  LEFT foot pain EXAM: LEFT FOOT - COMPLETE 3+ VIEW COMPARISON:  None available FINDINGS: Osseous demineralization. Joint spaces preserved. No fracture, dislocation, or bone destruction. IMPRESSION: No acute osseous abnormalities. Electronically Signed   By: Lavonia Dana M.D.   On: 07/26/2022 12:07   CARDIAC CATHETERIZATION  Result Date: 07/25/2022 Minor coronary irregularities. No obstructive disease Plan: medical management.   Medications:  cefTRIAXone (ROCEPHIN)  IV Stopped (07/26/22 2252)   vancomycin 1,000 mg (07/26/22 1743)    aspirin EC  81 mg Oral Daily   atorvastatin  40 mg Oral Daily   Chlorhexidine Gluconate Cloth  6 each Topical Q0600   cinacalcet  90 mg Oral Q M,W,F-HD   heparin injection (subcutaneous)  5,000 Units Subcutaneous Q8H   isosorbide-hydrALAZINE  1 tablet Oral TID   metoprolol tartrate  12.5 mg Oral BID   pantoprazole  40 mg Oral Daily   polyethylene glycol  17 g Oral Daily   sucroferric oxyhydroxide  500 mg Oral TID WC

## 2022-07-27 NOTE — Progress Notes (Signed)
Spoke with ID, who recommends discontinuing Rocephin.  Note from today will be signed later.  ID Dr. Gale Journey recommends calls consulting CT surgery.  CT surgery consulted, spoke with Dr. Cyndia Bent. They will see her. Urge antibiotic treatment.   Ezequiel Essex, MD

## 2022-07-27 NOTE — Assessment & Plan Note (Addendum)
VSS. Afebrile. Cardiology recommends FDG PET CT to distinguish degenerative disease vs inflammation, however this scan is not available at [facility]. - CTS consulted, recs appreciated - not a surgical candidate - Continue Vancomycin 1000 mg MWF, 05/01 per infectious disease

## 2022-07-27 NOTE — Progress Notes (Signed)
Family Medicine physcian Dr. Jeani Hawking at bedside noted acute change with word-finding challenge and new mentation changes and bilateral lower extremity weakness.  Transported to CT with MD, Rapid RN.

## 2022-07-27 NOTE — Consult Note (Signed)
Neurology Consultation  Reason for Consult: Code Stroke Referring Physician: Ardelia Mems  CC: Word finding difficulty  History is obtained from: Patient, Family Medicine Team  HPI: [name] is a 62 y.o. 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 She initially came to the hospital for NSTEMI and tricuspid vegetation. She was on a heparin gtt initially and is now just on subq heparin and ASA 81mg . 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 She has been receiving dialysis MWF with 3L UF. She is not a TNK candidate given the concern for endocarditis and she does not appear to have an LVO. She appears to have generalized weakness with equal strength bilaterally. She has difficulty elevating her bilateral lower extremities off the bed but plantar push and dorsiflexion are equal. She does not notice a change in her strength. She does have delayed responses and word finding difficulties that were noticed when the family medicine team was doing their exam.    LKW: 0830 tpa given?: No, endocarditis  Premorbid modified Rankin scale (mRS):  1-No significant post stroke disability and can perform usual duties with stroke symptoms   ROS: Full ROS was performed and is negative except as noted in the HPI.   Past Medical History:  Diagnosis Date   Accelerated hypertension    Aortic atherosclerosis (HCC)    Arthritis of knee    bilateral   Diabetes mellitus    type 2 - no meds   Diabetic gastroparesis (HCC)    Diverticulosis    ESRD (end stage renal disease) ([place])    Fibroid uterus    GERD (gastroesophageal reflux disease)    Hyperlipidemia    Hypertension    Hypertensive emergency 02/05/2019   Hypokalemia    IDA (iron deficiency anemia)    Nausea    Nausea and vomiting in adult 03/13/2022   Paroxysmal ventricular tachycardia (HCC)    Renal disorder     Umbilical hernia    Wears dentures     Family History  Problem Relation Age of Onset   Hypertension Mother    Diabetes Mother    Cancer Father        lung   Colon cancer Neg Hx    Stomach cancer Neg Hx    Esophageal cancer Neg Hx    Social History:   reports that she quit smoking about 10 years ago. Her smoking use included cigarettes. She has been exposed to tobacco smoke. She has never used smokeless tobacco. She reports that she does not drink alcohol and does not use drugs.  Medications  Current Facility-Administered Medications:    acetaminophen (TYLENOL) tablet 650 mg, 650 mg, Oral, Q6H PRN, Darci Current, DO, 650 mg at 07/27/22 0401   aspirin EC tablet 81 mg, 81 mg, Oral, Daily, Reino Bellis B, NP   atorvastatin (LIPITOR) tablet 40 mg, 40 mg, Oral, Daily, Reino Bellis B, NP, 40 mg at 07/26/22 0951   cefTRIAXone (ROCEPHIN) 1 g in sodium chloride 0.9 % 100 mL IVPB, 1 g, Intravenous, Q24H, Colletta Maryland, MD, Stopped at 07/26/22 2252   Chlorhexidine Gluconate Cloth 2 % PADS 6 each, 6 each, Topical, Q0600, Penninger, Lindsay, PA, 6 each at 07/27/22 0600   cinacalcet (SENSIPAR) tablet 90 mg, 90 mg, Oral, Q M,W,F-HD, Roney Jaffe, MD, 90 mg at 07/26/22 1259   heparin injection 5,000 Units, 5,000 Units, Subcutaneous, Q8H, Salvadore Oxford,  MD, 5,000 Units at 07/27/22 0607   isosorbide-hydrALAZINE (BIDIL) 20-37.5 MG per tablet 1 tablet, 1 tablet, Oral, TID, Cheryln Manly, NP, 1 tablet at 07/26/22 2216   metoprolol tartrate (LOPRESSOR) tablet 12.5 mg, 12.5 mg, Oral, BID, Reino Bellis B, NP, 12.5 mg at 07/26/22 2216   nitroGLYCERIN (NITROSTAT) SL tablet 0.4 mg, 0.4 mg, Sublingual, Q5 min PRN, Holley Bouche, MD   pantoprazole (PROTONIX) EC tablet 40 mg, 40 mg, Oral, Daily, Sowell, Erlene Quan, MD, 40 mg at 07/26/22 0951   polyethylene glycol (MIRALAX / GLYCOLAX) packet 17 g, 17 g, Oral, Daily, Holley Bouche, MD, 17 g at 07/25/22 0841   sucroferric oxyhydroxide  (VELPHORO) chewable tablet 500 mg, 500 mg, Oral, TID WC, Roney Jaffe, MD, 500 mg at 07/26/22 1250   vancomycin (VANCOCIN) IVPB 1000 mg/200 mL premix, 1,000 mg, Intravenous, Q M,W,F-HD, Leeanne Rio, MD, Last Rate: 200 mL/hr at 07/26/22 1743, 1,000 mg at 07/26/22 1743   Exam: Current vital signs: BP (!) 160/58 (BP Location: Right Leg)   Pulse 90   Temp 98 F (36.7 C) (Oral)   Resp 15   Ht 5\' 10"  (1.778 m)   Wt 92.1 kg   LMP  (LMP Unknown)   SpO2 97%   BMI 29.13 kg/m  Vital signs in last 24 hours: Temp:  [97.4 F (36.3 C)-98 F (36.7 C)] 98 F (36.7 C) (03/23 0826) Pulse Rate:  [74-90] 90 (03/23 0826) Resp:  [11-26] 15 (03/23 0826) BP: (110-160)/(50-97) 160/58 (03/23 0826) SpO2:  [97 %-100 %] 97 % (03/23 0826)  GENERAL: Awake, alert in NAD HEENT: - Normocephalic and atraumatic, dry mm, no LN++, no Thyromegally LUNGS - Clear to auscultation bilaterally with no wheezes CV - S1S2 RRR, no m/r/g, equal pulses bilaterally. ABDOMEN - Soft, nontender, nondistended with normoactive BS Ext: warm, well perfused, intact peripheral pulses, no edema  NEURO:  Mental Status: AA&Ox3  Language: speech is clear.  Responses are delayed  Cranial Nerves: PERRL. EOMI, visual fields full, no facial asymmetry, facial sensation intact, hearing intact, tongue/uvula/soft palate midline, normal sternocleidomastoid and trapezius muscle strength. No evidence of tongue atrophy or fasciculations Motor:  4/5 strength in bilateral upper extremities  2/5 strength in bilateral lower extremities  Tone: is normal and bulk is normal Sensation- Intact to light touch bilaterally Coordination: FTN intact bilaterally, no ataxia in BLE. Gait- deferred    NIHSS components Score: Comment  1a Level of Conscious 0[x]  1[]  2[]  3[]         1b LOC Questions 0[x]  1[]  2[]           1c LOC Commands 0[x]  1[]  2[]           2 Best Gaze 0[x]  1[]  2[]           3 Visual 0[x]  1[]  2[]  3[]         4 Facial Palsy 0[x]  1[]   2[]  3[]         5a Motor Arm - left 0[]  1[x]  2[]  3[]  4[]  UN[]     5b Motor Arm - Right 0[]  1[x]  2[]  3[]  4[]  UN[]     6a Motor Leg - Left 0[]  1[]  2[]  3[x]  4[]  UN[]     6b Motor Leg - Right 0[]  1[]  2[]  3[x]  4[]  UN[]     7 Limb Ataxia 0[x]  1[]  2[]  3[]  UN[]       8 Sensory 0[x]  1[]  2[]  UN[]         9 Best Language 0[x]  1[]  2[]  3[]         10 Dysarthria 0[x]   1[]  2[]  UN[]         11 Extinct. and Inattention 0[x]  1[]  2[]           TOTAL: 8        Labs I have reviewed labs in epic and the results pertinent to this consultation are:  CBC    Component Value Date/Time   WBC 8.1 07/27/2022 0126   RBC 3.94 07/27/2022 0126   HGB 11.1 (L) 07/27/2022 0126   HGB 11.4 11/12/2018 0949   HCT 33.8 (L) 07/27/2022 0126   HCT 34.6 11/12/2018 0949   PLT 296 07/27/2022 0126   PLT 404 11/12/2018 0949   MCV 85.8 07/27/2022 0126   MCV 78 (L) 11/12/2018 0949   MCH 28.2 07/27/2022 0126   MCHC 32.8 07/27/2022 0126   RDW 14.6 07/27/2022 0126   RDW 15.0 11/12/2018 0949   LYMPHSABS 0.6 (L) 07/23/2022 2244   MONOABS 0.6 07/23/2022 2244   EOSABS 0.0 07/23/2022 2244   BASOSABS 0.0 07/23/2022 2244    CMP     Component Value Date/Time   NA 136 07/26/2022 0244   NA 145 (H) 11/12/2018 0949   K 3.7 07/26/2022 0244   CL 96 (L) 07/26/2022 0244   CO2 24 07/26/2022 0244   GLUCOSE 65 (L) 07/26/2022 0244   BUN 56 (H) 07/26/2022 0244   BUN 34 (H) 11/12/2018 0949   CREATININE 10.28 (H) 07/26/2022 0244   CREATININE 1.19 (H) 09/28/2015 1524   CALCIUM 8.3 (L) 07/26/2022 0244   CALCIUM 8.8 02/12/2019 0318   PROT 6.6 07/25/2022 0158   PROT 7.1 08/06/2017 1450   ALBUMIN 2.7 (L) 07/25/2022 0158   ALBUMIN 3.7 08/06/2017 1450   AST 47 (H) 07/25/2022 0158   ALT 20 07/25/2022 0158   ALKPHOS 49 07/25/2022 0158   BILITOT 0.9 07/25/2022 0158   BILITOT 0.2 08/06/2017 1450   GFRNONAA 4 (L) 07/26/2022 0244   GFRNONAA 51 (L) 09/28/2015 1524   GFRAA 4 (L) 10/11/2019 1347   GFRAA 59 (L) 09/28/2015 1524    Lipid Panel      Component Value Date/Time   CHOL 198 07/25/2022 0158   CHOL 222 (H) 01/30/2021 1039   TRIG 133 07/25/2022 0158   HDL 38 (L) 07/25/2022 0158   HDL 60 01/30/2021 1039   CHOLHDL 5.2 07/25/2022 0158   VLDL 27 07/25/2022 0158   LDLCALC 133 (H) 07/25/2022 0158   LDLCALC 147 (H) 01/30/2021 1039     Imaging I have reviewed the images obtained:  CT-head 1. Evaluation is somewhat limited by contrast in the vasculature from recent CT heart study. Within this limitation, no acute intracranial process. 2. Air-fluid levels in the maxillary sinuses, which can be seen with acute sinusitis in the appropriate clinical setting. 3. ASPECTS is 10.  CTA Head and Neck: No emergent LVO but 80% stenosis of the left CCA.  Cardiac CT 1. CAD RADS 2 non obstructive CAD see description above study done without nitro or beta blocker 2. 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 Contrast enhancement and large central area of mixed HU's cannot really tell if severe caseating MAC vs annular abscess but latter may be more likely FDG PET CT would likely be best distinguishing test between degenerative disease vs infection/inflammation 3. Contrast enhancing small mass at base of septal TV leaflet near non coronary aortic annulus  3/22- TEE 1. Left ventricular ejection fraction, by estimation, is 60 to 65%. There is moderate concentric left ventricular hypertrophy.  2. Right ventricular systolic  function is normal. The right ventricular size is normal.  3. Left atrial size was mildly dilated. No left atrial/left atrial  appendage thrombus was detected. The LAA emptying velocity was 70 cm/s.  4. There is a mobile mass in the right atrium (described under tricuspid valve).  5. There is exuberant mitral annulus calcification, up to 2 cm in thickness. There is central hypoechogenicity. This could be due to caseous necrosis or annular abscess. In the medial posterior annulus (corresponding to the  medial scallop (P3), the MAC appears unusually mobile, suggesting tissue undermining. There is no clear evidence of communication between the left heart chambers and the mitral annulus cavity. The mitral valve is degenerative. Mild mitral valve regurgitation. Severe mitral annular calcification.  6. There is a highly mobile mass attached to the right atrial wall at the junction of the tricuspid annulus and the aortic annulus, at the right-most segment of the right fibrous trigone. It measures 11 mm in length and 3 mm in thickness and is highly suggestive of vegetation. The tricuspid valve is abnormal. Tricuspid valve regurgitation is mild to moderate.  7. There is a tiny filamentous mobile echodensity on the aortic  noncoronary cusp. This is most likely a Lambl's excrescence, although a vegetation cannot be completely excluded. The large vegetation described on the transthoracic echo is actually attached to the tricuspid annulus and is located in the right atrium. There is no clear evidence of aortic annulus abscess. The aortic valve is normal in structure. Aortic valve regurgitation is not visualized.  8. There is Moderate (Grade III) layered plaque involving the aortic arch and descending aorta.   Assessment:  62 y.o. female presenting with delayed speech and left sided weakness (resolved). She initially came to the hospital as a NSTEMI and was found to have endocarditis. She now presents with an acute neuro change. She is not a TNK candidate given her endocarditis and she does not have an LVO. She will need an MRI and a stroke work up. ID is on board to manage ABX and cardiology is following as well. She has had a TEE and cardiac CT already during this hospitalization. She is currently on ASA 81mg  per cardiology team. Heparin IV has been discontinued.   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 MD to update note as needed.   Janine Ores, DNP, FNP-BC [facility] Pager: [phone]  Attending Neurohospitalist Addendum Patient seen and examined with APP/Resident. Agree with the history and physical as documented above. Agree with the plan as documented, which I helped formulate. I have independently reviewed the chart, obtained history, review of systems and examined the patient.I have personally reviewed pertinent head/neck/spine imaging (CT/MRI).  62 year old with endocarditis, now with slurred speech, questionable aphasia which resolved and some left-sided weakness on exam plus minus effort dependent weakness but given her risk factors, would need workup to optimize stroke risk factors.  Recommendations as above.  Stroke team to follow.  Please feel free to call with any questions.  -- Amie Portland, MD Neurologist [facility] Pager: [phone]

## 2022-07-27 NOTE — Progress Notes (Signed)
FMTS Brief Progress Note  S:Went to bedside to evaluate chest swelling. No trauma to area No true chest pain but tenderness over sternum where swelling is present Patient reports that she noticed this after returning from MRI Never happened before   O: BP (!) 127/55 (BP Location: Right Wrist)   Pulse 84   Temp (!) 97.5 F (36.4 C) (Oral)   Resp 20   Ht 5\' 10"  (1.778 m)   Wt 92.1 kg   LMP  (LMP Unknown)   SpO2 100%   BMI 29.13 kg/m   Chest Wall: Upper sternal swelling without fluctuance, skin changes, drainage, warmth TTP without crepitus   A/P: Unsure of cause, had a fall and could have hit chest during this ordeal  Overall reassuring exam  Check bony architecture with CXR May need Soft tissue U/S in AM HDS, MRI brain pending  Discussed plan at bedside  Erskine Emery, MD 07/27/2022, 9:20 PM PGY-2, [facility] Night Resident  Please page [phone] with questions.

## 2022-07-27 NOTE — Progress Notes (Signed)
A bit drowsier this shift. Had busy day on with TEE and HD session. Did not eat much. A&Ox4 but communication less smooth. Takes longer to answer questions that are not yes/no.   The past 3 nights with this RN, patient has had sudden abdominal pain. Not always best at describing how it feels or rating it. Patient winces and groans in bed. PRN tylenol utilized each time and seems to help relieve the pain and patient falls back asleep.

## 2022-07-27 NOTE — Evaluation (Signed)
Clinical/Bedside Swallow Evaluation Patient Details  Name: [name] MRN: [id number] Date of Birth: 11-Jan-1961  Today's Date: 07/27/2022 Time: SLP Start Time (ACUTE ONLY): [id number] SLP Stop Time (ACUTE ONLY): [id number] SLP Time Calculation (min) (ACUTE ONLY): 10 min  Past Medical History:  Past Medical History:  Diagnosis Date   Accelerated hypertension    Aortic atherosclerosis ([place])    Arthritis of knee    bilateral   Diabetes mellitus    type 2 - no meds   Diabetic gastroparesis ([place])    Diverticulosis    ESRD (end stage renal disease) ([place])    Fibroid uterus    GERD (gastroesophageal reflux disease)    Hyperlipidemia    Hypertension    Hypertensive emergency 02/05/2019   Hypokalemia    IDA (iron deficiency anemia)    Nausea    Nausea and vomiting in adult 03/13/2022   Paroxysmal ventricular tachycardia (HCC)    Renal disorder    Umbilical hernia    Wears dentures    Past Surgical History:  Past Surgical History:  Procedure Laterality Date   AV FISTULA PLACEMENT Left 02/17/2019   Procedure: ARTERIOVENOUS (AV) FISTULA CREATION LEFT UPPER ARM;  Surgeon: Waynetta Sandy, MD;  Location: [facility];  Service: Vascular;  Laterality: Left;   CATARACT EXTRACTION     right eye   CESAREAN SECTION     x2   CHOLECYSTECTOMY     laparoscopic   CORONARY ANGIOGRAPHY N/A 07/25/2022   Procedure: CORONARY ANGIOGRAPHY;  Surgeon: [name], [name], MD;  Location: [facility] CV [facility];  Service: Cardiovascular;  Laterality: N/A;   FISTULA SUPERFICIALIZATION Left 04/06/2019   Procedure: FISTULA SUPERFICIALIZATION LEFT BRACHIOCEPHALIC;  Surgeon: Waynetta Sandy, MD;  Location: [facility];  Service: Vascular;  Laterality: Left;  Transposition left arm brachiocephalic fistula.   IR FLUORO GUIDE CV LINE RIGHT  02/09/2019   IR FLUORO GUIDE CV LINE RIGHT  03/05/2019   IR US GUIDE VASC ACCESS RIGHT  02/09/2019   MULTIPLE TOOTH EXTRACTIONS     REDUCTION MAMMAPLASTY Bilateral    TEE WITHOUT  CARDIOVERSION N/A 07/26/2022   Procedure: TRANSESOPHAGEAL ECHOCARDIOGRAM (TEE);  Surgeon: [name], MD;  Location: [facility] ENDOSCOPY;  Service: Cardiovascular;  Laterality: N/A;   HPI:  Patient is a 62 y.o. female with PMH: GERD, HTN, DM, ESRD on HD, HLD. She presented to the hospital from home on 07/24/22 with chest pain after being found down at home following an unwittnessed fall. In ED, she was found to have NSTEMI and multifocal PNA. She later met sepsis criteria. On 3/23, Code Stroke was called while patient in unit 6E with c/o aphasia, left sided weakness. CT head did not show acute intracranial process but evaluation was limited by contrast in vasculature; MRI brain ordered but has not yet been completed.    Assessment / Plan / Recommendation  Clinical Impression  Patient is not currently presenting with clinical s/s of dysphagia as per this bedside swallow evaluation. Swallow initiation was timely, no overt s/s aspiration or penetration and appearance of good laryngeal elevation during swallow. SLP not recommending further skilled intervention for dysphagia. SLP Visit Diagnosis: Dysphagia, unspecified (R13.10)    Aspiration Risk  No limitations    Diet Recommendation Regular;Thin liquid   Medication Administration: Whole meds with liquid Supervision: Patient able to self feed Compensations: Slow rate;Small sips/bites Postural Changes: Seated upright at 90 degrees    Other  Recommendations Oral Care Recommendations: Oral care BID    Recommendations for follow up  therapy are one component of a multi-disciplinary discharge planning process, led by the attending physician.  Recommendations may be updated based on patient status, additional functional criteria and insurance authorization.  Follow up Recommendations No SLP follow up      Assistance Recommended at Discharge  N/A  Functional Status Assessment  N/A  Frequency and Duration     N/A       Prognosis   N/A      Swallow Study   General Date of Onset: 07/27/22 HPI: Patient is a 62 y.o. female with PMH: GERD, HTN, DM, ESRD on HD, HLD. She presented to the hospital from home on 07/24/22 with chest pain after being found down at home following an unwittnessed fall. In ED, she was found to have NSTEMI and multifocal PNA. She later met sepsis criteria. On 3/23, Code Stroke was called while patient in unit 6E with c/o aphasia, left sided weakness. CT head did not show acute intracranial process but evaluation was limited by contrast in vasculature; MRI brain ordered but has not yet been completed. Type of Study: Bedside Swallow Evaluation Previous Swallow Assessment: none found Diet Prior to this Study: Regular;Thin liquids (Level 0) Temperature Spikes Noted: No Respiratory Status: Room air History of Recent Intubation: No Behavior/Cognition: Alert;Cooperative;Pleasant mood;Confused Oral Cavity Assessment: Within Functional Limits Oral Care Completed by SLP: No Oral Cavity - Dentition: Adequate natural dentition Vision: Functional for self-feeding Self-Feeding Abilities: Able to feed self Patient Positioning: Upright in bed Baseline Vocal Quality: Normal Volitional Cough: Strong Volitional Swallow: Able to elicit    Oral/Motor/Sensory Function Overall Oral Motor/Sensory Function: Within functional limits   Ice Chips     Thin Liquid Thin Liquid: Within functional limits Presentation: Straw;Self Fed    Nectar Thick     Honey Thick     Puree Puree: Not tested   Solid     Solid: Not tested     Sonia Baller, MA, CCC-SLP Speech Therapy

## 2022-07-27 NOTE — Code Documentation (Signed)
Stroke Response Nurse Documentation Code Documentation  [name] is a 62 y.o. female admitted to [facility]  on 07/23/2022 for NSTEMI with past medical hx of type 2 diabetes, HTN, ESRD on MWF dialysis. On aspirin 81 mg daily. Code stroke was activated by primary MD.   Patient on [facility] unit where she was LKW at [place] and now complaining of aphasia and left sided weakness. Patient was evaluated by primary RN at 0830 and no focal deficits were noted. Upon MD evaluation at 1045, pt was noted to have left arm weakness and delayed speech.  Stroke team at the bedside after patient activation. Patient to CT with team.   NIHSS 8, see documentation for details and code stroke times. Patient with disoriented, left arm weakness, and bilateral leg weakness on exam.   The following imaging was completed:  CT Head and CTA.   Patient is not a candidate for IV Thrombolytic due to diagnosis of endocarditis. Patient is not a candidate for IR due to LVO negative on CTA.   Care/Plan: q2h NIHSS and VS.   Bedside handoff with RN Ivy.    Vivion Romano L Eevie Lapp  Rapid Response RN

## 2022-07-27 NOTE — Progress Notes (Signed)
Noted patient to have swelling to upper chest area that is tender to touch, no warmth or ecchymosis noted.  Pt denies pain anywhere else, VSS.  Family at bedside that has been here since 2pm verified this is new.  MD paged via AMION with findings.  Awaiting return call.

## 2022-07-28 ENCOUNTER — Encounter (HOSPITAL_COMMUNITY): Payer: Self-pay | Admitting: Family Medicine

## 2022-07-28 ENCOUNTER — Inpatient Hospital Stay (HOSPITAL_COMMUNITY): Payer: 59

## 2022-07-28 DIAGNOSIS — I6340 Cerebral infarction due to embolism of unspecified cerebral artery: Secondary | ICD-10-CM | POA: Diagnosis not present

## 2022-07-28 DIAGNOSIS — R918 Other nonspecific abnormal finding of lung field: Secondary | ICD-10-CM | POA: Diagnosis not present

## 2022-07-28 DIAGNOSIS — R222 Localized swelling, mass and lump, trunk: Secondary | ICD-10-CM

## 2022-07-28 DIAGNOSIS — Z992 Dependence on renal dialysis: Secondary | ICD-10-CM

## 2022-07-28 DIAGNOSIS — I340 Nonrheumatic mitral (valve) insufficiency: Secondary | ICD-10-CM | POA: Diagnosis not present

## 2022-07-28 DIAGNOSIS — A498 Other bacterial infections of unspecified site: Secondary | ICD-10-CM

## 2022-07-28 DIAGNOSIS — E1122 Type 2 diabetes mellitus with diabetic chronic kidney disease: Secondary | ICD-10-CM

## 2022-07-28 DIAGNOSIS — I330 Acute and subacute infective endocarditis: Secondary | ICD-10-CM | POA: Diagnosis not present

## 2022-07-28 DIAGNOSIS — N185 Chronic kidney disease, stage 5: Secondary | ICD-10-CM | POA: Diagnosis not present

## 2022-07-28 LAB — RENAL FUNCTION PANEL
Albumin: 2.8 g/dL — ABNORMAL LOW (ref 3.5–5.0)
Anion gap: 14 (ref 5–15)
BUN: 36 mg/dL — ABNORMAL HIGH (ref 8–23)
CO2: 26 mmol/L (ref 22–32)
Calcium: 8.4 mg/dL — ABNORMAL LOW (ref 8.9–10.3)
Chloride: 92 mmol/L — ABNORMAL LOW (ref 98–111)
Creatinine, Ser: 8.57 mg/dL — ABNORMAL HIGH (ref 0.44–1.00)
GFR, Estimated: 5 mL/min — ABNORMAL LOW (ref >60)
Glucose, Bld: 83 mg/dL (ref 70–99)
Phosphorus: 5.6 mg/dL — ABNORMAL HIGH (ref 2.5–4.6)
Potassium: 3.7 mmol/L (ref 3.5–5.1)
Sodium: 132 mmol/L — ABNORMAL LOW (ref 135–145)

## 2022-07-28 LAB — CBC
HCT: 31.9 % — ABNORMAL LOW (ref 36.0–46.0)
Hemoglobin: 10.2 g/dL — ABNORMAL LOW (ref 12.0–15.0)
MCH: 27.9 pg (ref 26.0–34.0)
MCHC: 32 g/dL (ref 30.0–36.0)
MCV: 87.2 fL (ref 80.0–100.0)
Platelets: 327 10*3/uL (ref 150–400)
RBC: 3.66 MIL/uL — ABNORMAL LOW (ref 3.87–5.11)
RDW: 14.8 % (ref 11.5–15.5)
WBC: 7.6 10*3/uL (ref 4.0–10.5)
nRBC: 0 % (ref 0.0–0.2)

## 2022-07-28 LAB — GLUCOSE, CAPILLARY: Glucose-Capillary: 88 mg/dL (ref 70–99)

## 2022-07-28 MED ORDER — DARBEPOETIN ALFA 25 MCG/0.42ML IJ SOSY
25.0000 ug | PREFILLED_SYRINGE | INTRAMUSCULAR | Status: DC
  Administered 2022-07-29: 25 ug via SUBCUTANEOUS
  Filled 2022-07-28: qty 0.42

## 2022-07-28 NOTE — Progress Notes (Signed)
PT Cancellation Note  Patient Details Name: [name] MRN: [id number] DOB: July 31, 1960   Cancelled Treatment:    Reason Eval/Treat Not Completed: (P) Medical issues which prohibited therapy Pt awaiting additional brain MRI, concern for brain bleed. PT will follow back this afternoon for Re-Evaluation as able.   Taniya Dasher B. Migdalia Dk PT, DPT Acute Rehabilitation Services Please use secure chat or  Call Office [phone]    [place] 07/28/2022, 10:43 AM

## 2022-07-28 NOTE — Assessment & Plan Note (Deleted)
Uncertain chronicity. Chest xray no acute bony abnormality. No sign of erythema or drainage. CT negative. -Continue to monitor (can consider imaging if persistent or changes in symptoms)

## 2022-07-28 NOTE — Progress Notes (Signed)
Subjective: Seen in room and examined, this a.m. patient unable to answer questions was awakened and nods yes and no to understanding questioning agrees for dialysis tomorrow on schedule, denying shortness of breath or chest pain  Objective Vital signs in last 24 hours: Vitals:   07/28/22 0331 07/28/22 0418 07/28/22 0818 07/28/22 0947  BP: 102/60  136/67 133/66  Pulse: 79  81 89  Resp: 16  17   Temp: 98 F (36.7 C)  98.6 F (37 C) 97.8 F (36.6 C)  TempSrc: Oral  Oral Oral  SpO2:   100% 100%  Weight:  91.6 kg    Height:       Weight change:   Physical Exam: General: Alert elderly female NAD, nonverbal nodding yes and no to questions Heart: RRR, SEM Lungs: CTA bilaterally nonlabored breathing Abdomen: NABS soft NT ND Extremities: No pedal edema Dialysis Access: LUA AVF+ bruit   OP dialysis Orders: [facility]  4h  400/500   93kg   2/2 bath  LUA AVF  Heparin 4000+ 209midrun - last HD 3/15, post wt was 92.9kg - venofer 100mg  tiw IV thru 3/25 - sensipar 90 mg po tiw - mircera 30 mcg IV q 2 wks, last 3/1, due 3/15 but held due to Hb 11.1     Problem/Plan: ESRD - on HD MWF. per regular schedule,  NSTEMI/Acute systolic HF-cardiology evaluating ,elevated troponin's, getting IV heparin. Cardiac cath 3/21 with no CAD. Wall motion consistent with Takotsubo CM. 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 On ABX.  HTN - BP in goal.  On metoprolol. Amlodipine stopped, BiDil started per cardiology.   Volume - possible edema by CXR vs other PNA , max uf  as allowed up with HD ,under dry weight post.   Continue UF as tolerated and lower dry on dc if indicated.  Anemia esrd - Hb 11.1 >.10.2 trending down hemoglobin continue low-dose Aranesp tomorrow 3/25 MBD ckd - CCa in range, phos 5.6. Cont sensipar and binders.    Ernest Haber, PA-C [facility] Beeper [phone] 07/28/2022,10:39 AM  LOS: 4 days   Labs: Basic Metabolic Panel: Recent Labs  Lab 07/24/22 1502 07/25/22 0158 07/26/22 0244 07/27/22 1505 07/28/22 0245  NA  --    < > 136 130* 132*  K  --    < > 3.7 3.8 3.7  CL  --    < > 96* 91* 92*  CO2  --    < > 24 24 26   GLUCOSE  --    < > 65* 164* 83  BUN  --    < > 56* 34* 36*  CREATININE  --    < > 10.28* 7.63* 8.57*  CALCIUM  --    < > 8.3* 8.0* 8.4*  PHOS 6.8*  --   --  5.0* 5.6*   < > = values in this interval not displayed.   Liver Function Tests: Recent Labs  Lab 07/23/22 2244 07/24/22 0120 07/25/22 0158 07/27/22 1505 07/28/22 0245  AST 44* 48* 47*  --   --   ALT 19 17 20   --   --   ALKPHOS 57 51 49  --   --  BILITOT 0.8 0.6 0.9  --   --   PROT 7.5 7.0 6.6  --   --   ALBUMIN 3.2* 3.0* 2.7* 2.8* 2.8*   No results for input(s): "LIPASE", "AMYLASE" in the last 168 hours. No results for input(s): "AMMONIA" in the last 168 hours. CBC: Recent Labs  Lab 07/23/22 2244 07/23/22 2252 07/25/22 0158 07/26/22 0244 07/26/22 1414 07/27/22 0126 07/28/22 0245  WBC 12.6*   < > 7.0 5.6 7.4 8.1 7.6  NEUTROABS 11.3*  --   --   --   --   --   --   HGB 12.1   < > 11.4* 10.0* 10.6* 11.1* 10.2*  HCT 36.9   < > 35.3* 30.2* 33.1* 33.8* 31.9*  MCV 86.0   < > 86.1 85.6 86.9 85.8 87.2  PLT 252   < > 254 249 260 296 327   < > = values in this interval not displayed.   Cardiac Enzymes: Recent Labs  Lab 07/23/22 2244  CKTOTAL 1,948*   CBG: Recent Labs  Lab 07/23/22 2239 07/26/22 0854 07/27/22 1052 07/28/22 0838  GLUCAP 137* 78 82 88    Medications:  vancomycin 1,000 mg (07/26/22 1743)    aspirin EC  81 mg Oral Daily   atorvastatin  40 mg Oral  Daily   cinacalcet  90 mg Oral Q M,W,F-HD   feeding supplement (NEPRO CARB STEADY)  237 mL Oral BID BM   heparin injection (subcutaneous)  5,000 Units Subcutaneous Q8H   isosorbide-hydrALAZINE  1 tablet Oral TID   lidocaine  1 patch Transdermal Q24H   metoprolol tartrate  12.5 mg Oral BID   pantoprazole  40 mg Oral Daily   polyethylene glycol  17 g Oral Daily   sucroferric oxyhydroxide  500 mg Oral TID WC

## 2022-07-28 NOTE — Progress Notes (Signed)
MRI results sent to attending night coverage.

## 2022-07-28 NOTE — Progress Notes (Addendum)
FMTS Interim Progress Note  S: In to round on patient.  Nurse present.  Per nursing signout patient was alert and oriented x 3 at 5 AM.  At 7 AM patient A&O x 1 with increased difficulty speaking.  O: BP 136/67 (BP Location: Right Arm)   Pulse 81   Temp 98.6 F (37 C) (Oral)   Resp 17   Ht 5\' 10"  (1.778 m)   Wt 91.6 kg   LMP  (LMP Unknown)   SpO2 100%   BMI 28.98 kg/m   General: NAD, ill appearing, lying in hospital bed Neuro: A&Ox1, strength 4/5 bilateral upper extremities, symmetric, patient to respond to, words slurred, left side of face unable to smile  A/P: Embolic Stroke Code stroke call yesterday AM. MRI overnight consistent with embolic source due to patient's endocarditis. Per chart review patient was A&Ox3 yesterday, and did not have facial droop on neuro exam. Concern for repeat embolic stroke. Uncertain last known normal. I cannot rule out that this is a new mental status change since yesterday. Unfortunately, repeat stroke is unlikely to change current management unless large vessel occlusion that may benefit from mechanical thrombectomy. HDS. -Called Code Stroke   Salvadore Oxford, MD 07/28/2022, 9:21 AM PGY-1, [place] Service pager [phone]   Addendum: Discussed at bedside with Dr. Rory Percy. Treatment will likely not change, but will rule out new intracranial bleed with non contrast head CT. Dr. Leonie Man to follow-up today.

## 2022-07-28 NOTE — Progress Notes (Signed)
Daily Progress Note Intern Pager: [phone]  Patient name: [name] Medical record number: [id number] Date of birth: [birth year] Age: 62 y.o. Gender: female  Primary Care Provider: Lowry Ram, MD Consultants: Cardiology, Nephrology, ID, Neurology Code Status: FULL  Pt Overview and Major Events to Date:  3/20 Admitted 3/21 [place] 3/22 TEE 3/23 CT coronary morph, CODE STROKE  Assessment and Plan: 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 Work up ongoing.   * Acute bacterial endocarditis VSS. Afebrile. Cardiology recommends FDG PET CT to distinguish degenerative disease vs inflammation, however this scan is not available at [facility]. NGTD repeat Bcx. NSTEMI resolved but high risk for repeat cardiac event. - ID consulted, recs appreciated, Rocephin stopped - Cardiology consulted, appreciate recs - Continue Vancomycin 1000 mg MWF - Continue to follow original and repeat blood cultures  Stroke due to embolism [facility]) Neuro exam today worse with facial droop and A&O x1. Code stroke repeat. See interim progress note. MRI consistent with embolic stroke source likely from endocarditis.  Stroke team on board. - Appreciate ongoing care by stroke team - SLP repeat eval - Aspirin, subQ heparin - Neuro checks   Localized swelling of chest wall Uncertain chronicity. Chest xray no acute bony abnormality. No sign of erythema or drainage.  -Continue to monitor -Consider mandibular chest x ray view -Consider soft tissue US  Multifocal pneumonia Respiratory status stable. Completed 3 day Azithromycin course. - ID consulted, appreciate recommendations - Stop ceftriaxone and continue vancomycin - Continue to follow original and repeat blood cultures    ESRD on dialysis  [facility]) HD on MWF. Renal function at baseline reassuringly despite repeat contrast. -Nephrology consulted recs, appreciated -Electrolyte management per dialysis  -Velphoro, Sensipar    FEN/GI: Heart healthy, carb modified PPx: ASA, SubQ Heparin Dispo:Pending clinical improvement  Subjective:  NAEO. Patient unable to answer many questions. Able to be awakened with multiple verbal and tactile stimuli. Demonstrates some understanding of medical situation asking what happened.  Objective: Temp:  [97.5 F (36.4 C)-98.6 F (37 C)] 97.8 F (36.6 C) (03/24 0947) Pulse Rate:  [79-92] 89 (03/24 0947) Resp:  [11-26] 17 (03/24 0818) BP: (94-165)/(49-88) 133/66 (03/24 0947) SpO2:  [93 %-100 %] 100 % (03/24 0947) Weight:  [91.6 kg] 91.6 kg (03/24 0418) Physical Exam: General: NAD, ill appearing, lying comfortably in hospital bed Neuro: A&Ox1, strength 4/5 bilateral upper extremities, symmetric, patient to respond to, words slurred, left side of face unable to smile  Cardiovascular: RRR, no murmurs, no peripheral edema, sternal swelling present, non-erythematous Respiratory: normal WOB on RA, CTAB, no wheezes, ronchi or rales Abdomen: soft, mildly diffusely TTP, no rebound or guarding   Laboratory: Most recent CBC Lab Results  Component Value Date   WBC 7.6 07/28/2022   HGB 10.2 (L) 07/28/2022   HCT 31.9 (L) 07/28/2022   MCV 87.2 07/28/2022   PLT 327 07/28/2022   Most recent BMP    Latest Ref Rng & Units 07/28/2022    2:45 AM  BMP  Glucose 70 - 99 mg/dL 83   BUN 8 - 23 mg/dL 36   Creatinine 0.44 - 1.00 mg/dL 8.57   Sodium 135 - 145 mmol/L 132   Potassium 3.5 - 5.1 mmol/L 3.7   Chloride 98 - 111 mmol/L 92  CO2 22 - 32 mmol/L 26   Calcium 8.9 - 10.3 mg/dL 8.4     Other pertinent labs: none   Imaging/Diagnostic Tests:  MRI Brain 07/27/22 IMPRESSION: 1. Multiple scattered subcentimeter acute ischemic infarcts involving the bilateral cerebral hemispheres and pons. No  associated hemorrhage or mass effect. A central thromboembolic etiology is suspected given the various vascular distributions involved. 2. Underlying moderately advanced chronic microvascular ischemic disease with a few scattered remote infarcts involving the right frontal lobe and cerebellum.  Salvadore Oxford, MD 07/28/2022, 11:18 AM  PGY-1, [place] Intern pager: [phone], text pages welcome Secure chat group [place]

## 2022-07-28 NOTE — Progress Notes (Addendum)
Rounding Note    Patient Name: THANDIWE SAMARAS Date of Encounter: 07/28/2022  [place]  Subjective   Not very verbal discussed CTA findings with primary ? Fall prior to admission with swelling over anterior chest   Inpatient Medications    Scheduled Meds:  aspirin EC  81 mg Oral Daily   atorvastatin  40 mg Oral Daily   cinacalcet  90 mg Oral Q M,W,F-HD   feeding supplement (NEPRO CARB STEADY)  237 mL Oral BID BM   heparin injection (subcutaneous)  5,000 Units Subcutaneous Q8H   isosorbide-hydrALAZINE  1 tablet Oral TID   lidocaine  1 patch Transdermal Q24H   metoprolol tartrate  12.5 mg Oral BID   pantoprazole  40 mg Oral Daily   polyethylene glycol  17 g Oral Daily   sucroferric oxyhydroxide  500 mg Oral TID WC   Continuous Infusions:  vancomycin 1,000 mg (07/26/22 1743)   PRN Meds: acetaminophen, calcium carbonate, capsaicin, lidocaine, nitroGLYCERIN   Vital Signs    Vitals:   07/28/22 0043 07/28/22 0331 07/28/22 0418 07/28/22 0818  BP: (!) 104/56 102/60  136/67  Pulse: 81 79  81  Resp: 14 16  17   Temp: 97.9 F (36.6 C) 98 F (36.7 C)  98.6 F (37 C)  TempSrc: Oral Oral  Oral  SpO2: 100%   100%  Weight:   91.6 kg   Height:        Intake/Output Summary (Last 24 hours) at 07/28/2022 0837 Last data filed at 07/27/2022 2000 Gross per 24 hour  Intake 236 ml  Output --  Net 236 ml      07/28/2022    4:18 AM 07/24/2022    6:03 PM 07/24/2022    2:00 PM  Last 3 Weights  Weight (lbs) 201 lb 15.1 oz 203 lb 0.7 oz 208 lb 8.9 oz  Weight (kg) 91.6 kg 92.1 kg 94.6 kg      Telemetry    Sinus Rhythm, deep TWI - Personally Reviewed  ECG    No new tracing this morning  Physical Exam   Chronically ill LUE fistula for dialysis with thrill Bilateral rhonchi SEM Abdomen benign Plus one edema  Labs    High Sensitivity Troponin:   Recent Labs  Lab 07/23/22 2244 07/24/22 0120 07/24/22 0733 07/24/22 1221 07/24/22 1502   TROPONINIHS 2,155* 1,948* 6,242* 4,206* 4,276*     Chemistry Recent Labs  Lab 07/23/22 2244 07/23/22 2252 07/24/22 0120 07/25/22 0158 07/26/22 0244 07/27/22 1505 07/28/22 0245  NA 137   < > 139 136 136 130* 132*  K 4.4   < > 4.9 4.0 3.7 3.8 3.7  CL 94*   < > 96* 96* 96* 91* 92*  CO2 21*  --  23 23 24 24 26   GLUCOSE 136*   < > 134* 70 65* 164* 83  BUN 64*   < > 64* 38* 56* 34* 36*  CREATININE 13.99*   < > 14.02* 8.52* 10.28* 7.63* 8.57*  CALCIUM 9.0  --  8.8* 8.4* 8.3* 8.0* 8.4*  MG 2.6*  --   --   --   --   --   --   PROT 7.5  --  7.0 6.6  --   --   --   ALBUMIN 3.2*  --  3.0* 2.7*  --  2.8* 2.8*  AST 44*  --  48* 47*  --   --   --   ALT 19  --  17 20  --   --   --   ALKPHOS 57  --  51 49  --   --   --   BILITOT 0.8  --  0.6 0.9  --   --   --   GFRNONAA 3*  --  3* 5* 4* 6* 5*  ANIONGAP 22*  --  20* 17* 16* 15 14   < > = values in this interval not displayed.    Lipids  Recent Labs  Lab 07/25/22 0158  CHOL 198  TRIG 133  HDL 38*  LDLCALC 133*  CHOLHDL 5.2    Hematology Recent Labs  Lab 07/26/22 1414 07/27/22 0126 07/28/22 0245  WBC 7.4 8.1 7.6  RBC 3.81* 3.94 3.66*  HGB 10.6* 11.1* 10.2*  HCT 33.1* 33.8* 31.9*  MCV 86.9 85.8 87.2  MCH 27.8 28.2 27.9  MCHC 32.0 32.8 32.0  RDW 14.7 14.6 14.8  PLT 260 296 327   Thyroid No results for input(s): "TSH", "FREET4" in the last 168 hours.  BNPNo results for input(s): "BNP", "PROBNP" in the last 168 hours.  DDimer No results for input(s): "DDIMER" in the last 168 hours.   Radiology    MR BRAIN WO CONTRAST  Result Date: 07/28/2022 CLINICAL DATA:  Initial evaluation for neuro deficit, stroke suspected. EXAM: MRI HEAD WITHOUT CONTRAST TECHNIQUE: Multiplanar, multiecho pulse sequences of the brain and surrounding structures were obtained without intravenous contrast. COMPARISON:  Prior CTs from earlier the same day. FINDINGS: Brain: Cerebral volume within normal limits. Patchy and confluent T2/FLAIR hyperintensity  involving the periventricular deep white matter both cerebral hemispheres as well as the pons, consistent with chronic small vessel ischemic disease, moderately advanced in nature. Small remote cortical subcortical infarct involving the right frontal lobe noted. Few small remote cerebellar infarcts noted as well. Multiple scattered foci of restricted diffusion are seen involving the bilateral cerebral hemispheres, consistent with acute ischemic infarcts. For reference purposes, the largest of these foci is seen at the subcortical left frontal lobe in measures 1 cm (series 7, image 58). Patchy involvement of the ventral pons noted as well (series 5, image 69). No associated hemorrhage or mass effect. A central thromboembolic etiology is suspected given the various vascular distributions involved. No mass lesion, midline shift or mass effect. No hydrocephalus or extra-axial fluid collection. Pituitary gland and suprasellar region within normal limits. Vascular: Major intracranial vascular flow voids are maintained. Skull and upper cervical spine: Craniocervical junction within normal limits. Bone marrow signal intensity normal. No scalp soft tissue abnormality. Sinuses/Orbits: Prior bilateral ocular lens replacement. Scattered mucosal thickening present throughout the paranasal sinuses. Small volume layering secretions present within the left sphenoid and maxillary sinuses. No mastoid effusion. Other: None. IMPRESSION: 1. Multiple scattered subcentimeter acute ischemic infarcts involving the bilateral cerebral hemispheres and pons. No associated hemorrhage or mass effect. A central thromboembolic etiology is suspected given the various vascular distributions involved. 2. Underlying moderately advanced chronic microvascular ischemic disease with a few scattered remote infarcts involving the right frontal lobe and cerebellum. Electronically Signed   By: Jeannine Boga M.D.   On: 07/28/2022 00:37   DG Chest 1  View  Result Date: 07/27/2022 CLINICAL DATA:  Endocarditis EXAM: PORTABLE CHEST 1 VIEW COMPARISON:  07/23/2022 FINDINGS: Cardiac shadow is enlarged but stable. Aortic calcifications are seen. Previously seen patchy infiltrates have resolved in the interval. No new focal infiltrate is seen. No bony abnormality is noted. IMPRESSION: No active disease. Electronically Signed   By: Inez Catalina  M.D.   On: 07/27/2022 21:36   CT CORONARY MORPH W/CTA COR W/SCORE W/CA W/CM &/OR WO/CM  Addendum Date: 07/27/2022   ADDENDUM REPORT: 07/27/2022 12:11 CLINICAL DATA:  This over-read does not include interpretation of cardiac or coronary anatomy or pathology. The coronary CTA interpretation by the cardiologist is attached. COMPARISON:  None available. FINDINGS: Patchy areas of ground-glass opacity are seen throughout the visualized portions of both lungs, which nonspecific and could be due to edema, atypical infection, or acute lung injury. No evidence of pulmonary consolidation no suspicious nodules or masses are identified in the visualized portion of the lungs. No pleural fluid seen. The visualized portions of the mediastinum and hilar regions are unremarkable. IMPRESSION: Heterogeneous ground-glass opacity throughout visualized portions of both lungs. This finding is nonspecific and could be due to edema, atypical infection, or acute lung injury. Electronically Signed   By: Marlaine Hind M.D.   On: 07/27/2022 12:11   Result Date: 07/27/2022 CLINICAL DATA:  Question SBE with Vegetations Abnormal Mitral Valve EXAM: Cardiac CT TECHNIQUE: The patient was scanned on a Siemens Force [id number] slice scanner. A 120 kV retrospective scan was triggered in the ascending thoracic aorta at 140 HU's. Gantry rotation speed was 250 msecs and collimation was .6 mm. No beta blockade or nitro were given. The 3D data set was reconstructed in 5% intervals of the R-R cycle. Systolic and diastolic phases were analyzed on a dedicated work station  using MPR, MIP and VRT modes. The patient received 80 cc of contrast. FINDINGS: Mild bi atrial enlargement No ASD/VSD/PFO. No pericardial effusion Normal ascending thoracic aorta 2.9 cm Moderate calcific atherosclerosis with normal arch vessels AV: Tri leaflet No vegetation noted There is a small area of apparent contrast enhancement from non coronary sinus to the RA Cannot r/o fistula but correlates with mobile mass seen on TEE measuring 7.7 mm x 4.1 mm Non coronary sinus: 2.85 cm Right coronary sinus: 2.84 cm Left coronary sinus: 2.74 cm MV: Markedly abnormal posterior annulus. Mass on ventricular side of leaflet measuring 1.9 cm x 1.5 cm partially calcified but mixed tissue density with HU's 150 to 250 Apears to be some contrast enhancement The posterior leaflet has restricted motion with entire leaflet being thickened 1.2 cm x 2.9 cm The overall appearance appears more to be and annular abscess rather than caseating mitral annular calcification The mass extends posteriorly toward the coronary sinus The non contrast images show calcified annular edges through out the mitral annulus with edge HU's as high as 650 The core area inside the MAC measures only 60-90 HU's TV: Not well seen with contrast wash out in RV PV: Not well seen with contrast wash out in RVOT Coronary Arteries: 1-24% calcified plaque in proximal and mid vessel 25-49% calcified plaque in PDA/PLB LM is normal LAD with 25-40% calcified plaque in proximal and mid vessel Normal diagonal and IM branches LCX 1024% soft plaque proximally and 1024% calcified plaque distally . IMPRESSION: 1. CAD RADS 2 non obstructive CAD see description above study done without nitro or beta blocker 2. 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 Contrast enhancement and large central area of mixed HU's cannot really tell if severe caseating MAC vs annular abscess but latter may be more likely FDG PET CT would likely be best distinguishing test between  degenerative disease vs infection/inflammation 3. Contrast enhancing small mass at base of septal TV leaflet near non coronary aortic annulus Jenkins Rouge Electronically Signed: By: Jenkins Rouge M.D. On:  07/27/2022 11:39   CT ANGIO HEAD NECK W WO CM (CODE STROKE)  Result Date: 07/27/2022 CLINICAL DATA:  Aphasia and left-sided weakness EXAM: CT ANGIOGRAPHY HEAD AND NECK TECHNIQUE: Multidetector CT imaging of the head and neck was performed using the standard protocol during bolus administration of intravenous contrast. Multiplanar CT image reconstructions and MIPs were obtained to evaluate the vascular anatomy. Carotid stenosis measurements (when applicable) are obtained utilizing NASCET criteria, using the distal internal carotid diameter as the denominator. RADIATION DOSE REDUCTION: This exam was performed according to the departmental dose-optimization program which includes automated exposure control, adjustment of the mA and/or kV according to patient size and/or use of iterative reconstruction technique. CONTRAST:  22mL OMNIPAQUE IOHEXOL 350 MG/ML SOLN COMPARISON:  09/18/2019 CTA head and neck, correlation is also made with CT head 07/27/2022 FINDINGS: CT HEAD FINDINGS For noncontrast findings, please see same day CT head. CTA NECK FINDINGS Aortic arch: Standard branching. Imaged portion shows no evidence of aneurysm or dissection. Aortic atherosclerosis. Approximately 80% stenosis at the origin of the left ICA (series 9, image 127 and series 7, image 326). Right carotid system: No evidence of dissection, occlusion, or hemodynamically significant stenosis (greater than 50%). Atherosclerotic disease at the bifurcation and in the proximal ICA is not hemodynamically significant. Left carotid system: No evidence of dissection, occlusion, or hemodynamically significant stenosis (greater than 50%). Atherosclerotic disease at the bifurcation and in the proximal ICA is not hemodynamically significant. Vertebral  arteries: Mild stenosis at the origin of the left vertebral artery, with additional moderate stenosis in the left V2 segment (series 7, image 2917). The right vertebral artery is patent from its origin to the skull base without significant stenosis. No evidence of dissection. Skeleton: No acute osseous abnormality. Degenerative changes in the cervical spine. Other neck: Negative. Upper chest: Ground-glass opacities in the right lung. No pleural effusion. Review of the MIP images confirms the above findings CTA HEAD FINDINGS Anterior circulation: Both internal carotid arteries are patent to the termini, with moderate stenosis in the right cavernous and left supraclinoid ICA. A1 segments patent. Normal anterior communicating artery. Anterior cerebral arteries are patent to their distal aspects. No M1 stenosis or occlusion. MCA branches perfused and symmetric. Posterior circulation: Vertebral arteries patent to the vertebrobasilar junction without stenosis. Basilar patent to its distal aspect. Superior cerebellar arteries patent proximally. Patent P1 segments, hypoplastic on the right. Near fetal origin of the right PCA from the right posterior communicating artery. The left posterior communicating artery is also patent. PCAs are somewhat irregular perfused to their distal aspects without focal stenosis. Venous sinuses: As permitted by contrast timing, patent. Anatomic variants: None significant. Review of the MIP images confirms the above findings IMPRESSION: 1. No intracranial large vessel occlusion. 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 2. Approximately 80% stenosis at the origin of the left ICA. Mild stenosis at the left vertebral artery origin and moderate stenosis in the left V2. No other hemodynamically significant stenosis in the neck. 3. Ground-glass opacities in the imaged right lung, which could be infectious or inflammatory. Consider CT chest for further evaluation. 4. Aortic  atherosclerosis. Aortic Atherosclerosis (ICD10-I70.0). Imaging results were communicated on 07/27/2022 at 11:43 am to provider Dr. Rory Percy via secure text paging. Electronically Signed   By: Merilyn Baba M.D.   On: 07/27/2022 11:43   CT HEAD CODE STROKE WO CONTRAST  Result Date: 07/27/2022 CLINICAL DATA:  Code stroke.  Aphasia and left-sided weakness EXAM: CT HEAD WITHOUT CONTRAST TECHNIQUE: Contiguous  axial images were obtained from the base of the skull through the vertex without intravenous contrast. RADIATION DOSE REDUCTION: This exam was performed according to the departmental dose-optimization program which includes automated exposure control, adjustment of the mA and/or kV according to patient size and/or use of iterative reconstruction technique. COMPARISON:  07/23/2022 CT head FINDINGS: Brain: No evidence of acute infarction, hemorrhage, mass, mass effect, or midline shift. No hydrocephalus or extra-axial collection. Vascular: Contrast is noted with vasculature from same day CT heart, which limits evaluation for hyperdense vessel. Skull: Negative for fracture or focal lesion. Sinuses/Orbits: Mucosal thickening in the ethmoid air cells and left sphenoid sinus, with air-fluid levels in the maxillary sinuses. Status post bilateral lens replacements. Other: The mastoid air cells are well aerated. ASPECTS [facility] Stroke Program Early CT Score) - Ganglionic level infarction (caudate, lentiform nuclei, internal capsule, insula, M1-M3 cortex): 7 - Supraganglionic infarction (M4-M6 cortex): 3 Total score (0-10 with 10 being normal): 10 IMPRESSION: 1. Evaluation is somewhat limited by contrast in the vasculature from recent CT heart study. Within this limitation, no acute intracranial process. 2. Air-fluid levels in the maxillary sinuses, which can be seen with acute sinusitis in the appropriate clinical setting. 3. ASPECTS is 10. Imaging results were communicated on 07/27/2022 at 11:14 am to provider Dr. Rory Percy via  secure text paging. Electronically Signed   By: Merilyn Baba M.D.   On: 07/27/2022 11:14   ECHO TEE  Result Date: 07/26/2022    TRANSESOPHOGEAL ECHO REPORT   Patient Name:   SERINA JAMESON Date of Exam: 07/26/2022 Medical Rec #:  [id number]    Height:       70.0 in Accession #:    [id number]   Weight:       203.0 lb Date of Birth:  1960/08/20    BSA:          2.101 m Patient Age:    9 years     BP:           111/65 mmHg Patient Gender: F            HR:           88 bpm. Exam Location:  Inpatient Procedure: Transesophageal Echo, 3D Echo, Cardiac Doppler and Color Doppler Indications:    Aortic valve vegetation  History:        Patient has prior history of Echocardiogram examinations, most                 recent 07/24/2022. Arrythmias:Tachycardia; Risk                 Factors:Hypertension, Dyslipidemia and Diabetes. ESRD.  Sonographer:    Eartha Inch Referring Phys: [id number] [place]: After discussion of the risks and benefits of a TEE, an informed consent was obtained from the patient. TEE procedure time was 12 minutes. The transesophogeal probe was passed without difficulty through the esophogus of the patient. Imaged were obtained with the patient in a left lateral decubitus position. Sedation performed by different physician. The patient was monitored while under deep sedation. Anesthestetic sedation was provided intravenously by Anesthesiology: 132.89mg  of Propofol. Image quality was good. The patient's vital signs; including heart rate, blood pressure, and oxygen saturation; remained stable throughout the procedure. The patient developed no complications during the procedure.  IMPRESSIONS  1. Left ventricular ejection fraction, by estimation, is 60 to 65%. The left ventricle has normal function. The left ventricle has no regional wall motion abnormalities. There is moderate  concentric left ventricular hypertrophy.  2. Right ventricular systolic function is normal. The right ventricular size is  normal.  3. Left atrial size was mildly dilated. No left atrial/left atrial appendage thrombus was detected. The LAA emptying velocity was 70 cm/s.  4. There is a mobile mass in the right atrium (described under tricuspid valve).  5. There is exuberant mitral annulus calcification, up to 2 cm in thickness. There is central hypoechogenicity. This could be due to caseous necrosis or annular abscess. In the medial posterior annulus (corresponding to the medial scallop (P3), the MAC appears unusually mobile, suggesting tissue undermining. There is no clear evidence of communication between the left heart chambers and the mitral annulus cavity. The mitral valve is degenerative. Mild mitral valve regurgitation. Severe mitral annular calcification.  6. There is a highly mobile mass attached to the right atrial wall at the junction of the tricuspid annulus and the aortic annulus, at the right-most segment of the right fibrous trigone. It measures 11 mm in length and 3 mm in thickness and is highly suggestive of vegetation. The tricuspid valve is abnormal. Tricuspid valve regurgitation is mild to moderate.  7. There is a tiny filamentous mobile echodensity on the aortic noncoronary cusp. This is most likely a Lambl's excrescence, although a vegetation cannot be completely excluded. The large vegetation described on the transthoracic echo is actually attached to the tricuspid annulus and is located in the right atrium. There is no clear evidence of aortic annulus abscess. The aortic valve is normal in structure. Aortic valve regurgitation is not visualized.  8. There is Moderate (Grade III) layered plaque involving the aortic arch and descending aorta. 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 FINDINGS  Left Ventricle: Left ventricular ejection fraction, by estimation, is 60 to 65%. The left ventricle has normal function. The left ventricle has no regional wall motion abnormalities. The left ventricular internal cavity size was normal in size. There is  moderate concentric left ventricular hypertrophy. Right Ventricle: The right ventricular size is normal. No increase in right ventricular wall thickness. Right ventricular systolic function is normal. Left Atrium: Left atrial size was mildly dilated. No left atrial/left atrial appendage thrombus was detected. The LAA emptying velocity was 70 cm/s. Right Atrium: Right atrial size was normal in size. There is a mobile mass in the right atrium (described under tricuspid valve). Pericardium: There is no evidence of pericardial effusion. Mitral Valve: There is exuberant mitral annulus calcification, up to 2 cm in thickness. There is central hypoechogenicity. This could be due to caseous necrosis or annular abscess. In the medial posterior annulus (corresponding to the medial scallop (P3), the MAC appears unusually mobile, suggesting tissue undermining. There is no clear evidence of communication between the left heart chambers and the mitral annulus cavity. The mitral valve is degenerative in appearance. Severe mitral annular calcification. Mild mitral valve regurgitation. Tricuspid Valve: There is a highly mobile mass attached to the right atrial wall at the junction of the tricuspid annulus and the aortic annulus, at the right-most segment of the right fibrous trigone. It measures 11 mm in length and 3 mm in thickness and is highly suggestive of vegetation. The tricuspid valve is abnormal. Tricuspid valve regurgitation is  mild to moderate. Aortic Valve: There is a tiny filamentous mobile echodensity on the  aortic noncoronary cusp. This is most likely a Lambl's excrescence, although a vegetation cannot be completely excluded. The large vegetation described on the transthoracic echo is actually attached to the tricuspid annulus and is located in the right atrium. There is no clear evidence of aortic annulus abscess. The aortic valve is normal in structure. Aortic valve regurgitation is not visualized. Pulmonic Valve: The pulmonic valve was normal in structure. Pulmonic valve regurgitation is trivial. Aorta: The aortic root, ascending aorta, aortic arch and descending aorta are all structurally normal, with no evidence of dilitation or obstruction. There is moderate (Grade III) layered plaque involving the aortic arch and descending aorta. IAS/Shunts: No atrial level shunt detected by color flow Doppler. There is no evidence of an atrial septal defect. Additional Comments: Spectral Doppler performed. [name] MD Electronically signed by [name] MD Signature Date/Time: 07/26/2022/2:44:38 PM    Final     Cardiac Studies   Echo: 07/24/2022   IMPRESSIONS     1. Left ventricular ejection fraction, by estimation, is 35 to 40%. The  left ventricle has moderately decreased function. The left ventricle  demonstrates regional wall motion abnormalities with anteroseptal and  peri-apical hypokinesis. There is mild  concentric left ventricular hypertrophy. Left ventricular diastolic  parameters are consistent with Grade I diastolic dysfunction (impaired  relaxation).   2. Right ventricular systolic function is normal. The right ventricular  size is normal. There is mildly elevated pulmonary artery systolic  pressure. The estimated right ventricular systolic pressure is 0000000 mmHg.   3. Left atrial size was mildly dilated.   4. The mitral valve is normal in structure. Trivial mitral valve  regurgitation. Mild mitral stenosis. The mean mitral valve gradient is 5.0  mmHg. Moderate mitral annular  calcification.   5. Tricuspid valve regurgitation is mild to moderate.   6. Small (0.9 cm) mobile vegetation attached to the aortic side of the  aortic valve near the annulus. This was only seen in the parasternal short  axis view. Would consider TEE for full evaluation. The aortic valve is  tricuspid. There is mild  calcification of the aortic valve. Aortic valve regurgitation is not  visualized. No aortic stenosis is present.   7. The inferior vena cava is normal in size with greater than 50%  respiratory variability, suggesting right atrial pressure of 3 mmHg.   FINDINGS   Left Ventricle: Left ventricular ejection fraction, by estimation, is 35  to 40%. The left ventricle has moderately decreased function. The left  ventricle demonstrates regional wall motion abnormalities. The left  ventricular internal cavity size was  normal in size. There is mild concentric left ventricular hypertrophy.  Left ventricular diastolic parameters are consistent with Grade I  diastolic dysfunction (impaired relaxation).   Right Ventricle: The right ventricular size is normal. No increase in  right ventricular wall thickness. Right ventricular systolic function is  normal. There is mildly elevated pulmonary artery systolic pressure. The  tricuspid regurgitant velocity is 3.24   m/s, and with an assumed right atrial pressure of 3 mmHg, the estimated  right ventricular systolic pressure is 0000000 mmHg.   Left Atrium: Left atrial size was mildly dilated.   Right Atrium: Right atrial size was normal in size.   Pericardium: Trivial pericardial effusion is present.   Mitral Valve: The mitral valve is normal in structure. There is mild  calcification of the mitral valve leaflet(s). Moderate mitral annular  calcification. Trivial mitral valve regurgitation. Mild mitral valve  stenosis. MV peak gradient, 8.8 mmHg. The mean   mitral valve gradient is 5.0 mmHg.   Tricuspid Valve: The tricuspid valve is  normal in structure. Tricuspid  valve regurgitation is mild to moderate.   Aortic Valve: Small (0.9 cm) mobile vegetation attached to the aortic side  of the aortic valve near the annulus. The aortic valve is tricuspid. There  is mild calcification of the aortic valve. Aortic valve regurgitation is  not visualized. No aortic  stenosis is present. Aortic valve mean gradient measures 2.5 mmHg. Aortic  valve peak gradient measures 4.9 mmHg. Aortic valve area, by VTI measures  2.92 cm.   Pulmonic Valve: The pulmonic valve was normal in structure. Pulmonic valve  regurgitation is not visualized.   Aorta: The aortic root is normal in size and structure.   Venous: The inferior vena cava is normal in size with greater than 50%  respiratory variability, suggesting right atrial pressure of 3 mmHg.   IAS/Shunts: No atrial level shunt detected by color flow Doppler.    Cath: 07/25/2022  Minor coronary irregularities. No obstructive disease   Plan: medical management.  Patient Profile     62 y.o. female with a hx of HTN, HLD, DM 2, CVA, ESRD on HD (MWF) who was seen 07/24/2022 for the evaluation of acute acute coronary syndrome at the request of ED.   Assessment & Plan    NSTEMI -- High-sensitivity troponin 2155>> 1948>> 6242.  EKG showed sinus tachycardia with TWI in anterolateral leads. Cath 07/25/22 no obstructive CAD    HFrEF -- Echo showed LVEF of 35 to [id number], grade 1 diastolic dysfunction, normal RV size and function, wall motion abnormalities with anterior septal and periapical hypokinesis. Suspect this is more of a stress cardiomyopathy -- volume management per HD -- GDMT: continue low dose metoprolol, add low dose BiDil. Stop norvasc - note TEE suggested EF normalized    Abd pain Nausea -- Complains of centralized and lower abdominal pain, tender with palpation as well as nausea -- Abdominal x-ray with no acute findings, small left renal artery aneurysm -- work up per primary    ERSD on HD -- On Monday Wednesday Friday schedule -- Nephrology following - fistula LUE with thrill    PNA -- Chest x-ray with multifocal pneumonia -- Currently on ceftriaxone and vancomycin    AMS Unwitnessed fall -- Mental status poor see below .  Reports she lives at home with her son.  Down on the floor with around 13 hours. CK 1948 -- CT head and neck negative - MRI with multiple ischemic infarcts ? Embolic  - Not a great candidate for anticoagulation with MS/Falls favor ASA/Plavix   HLD -- lipid panel pending -- continue Crestor to 20mg  daily   ? SBE Abnormal TTE --  TEE 3/22 with mobile mass in RA adjacent to TR/AV annulus  -   Cardiac CTA similar findings Would need FDG PET or tagged WBC study To differentiate Certainly could be source of embolus BC;s negative Consider prolonged antibiotics per ID and outpatient FDG PET ASA / Plavix for further stroke prophylaxis I have spoken with Dr Cyndia Bent who will see patient She does not appear to be surgical candidate with poor functional status and recent embolic stroke Surgery would be extremely challenging with risk of valve dehiscence, occlusion of LCX or coronary sinus    For questions or updates, please contact [place] Please consult [email or web address] for contact info  under      Signed, Jenkins Rouge, MD  07/28/2022, 8:37 AM

## 2022-07-28 NOTE — Progress Notes (Signed)
FMTS Brief Progress Note  S:Went bedside w/ Dr. Jerilee Hoh to see patient. Patient sitting up in bed, in good spirits, with sister at bedside. Denies any chest pain, but continues to note some abdominal pain from time to time. Patient otherwise feeling well, but has been having some issue w/ word finding.   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  -plans per day team  Acute bacterial endocarditis -Plans per day team -Continue abx, Vancomycin  - Orders reviewed. Labs for AM ordered, which was adjusted as needed.   Holley Bouche, MD 07/28/2022, 10:21 PM PGY-2, [place] Night Resident  Please page [phone] with questions.

## 2022-07-28 NOTE — Progress Notes (Signed)
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 Corynebacterium overlaps with propionebacterium species and almost never caused endocarditis outside of prosthesis/trauma association. It is usually an indolent process and when found signals a far advanced process with complication requiring surgery, which appears to be likely in her case   This particular species of corynebacterium also is very multidrug resistance. Vancomycin maintains highest percentage of efficacy    Critically, she developed stroke today with left sided weakness. Neurology following   With this new complication and abscess finding on tee, and as mentioned above the chronic nature of the coryne infection, would get CTS on board    Multifocal pna ?embolic from atrial infected clot?   Micro: 3/20 2 of 2 sets 20 minutes apart grew corynebacterium jk 3/22 bcx negative   Imaging: 3/22 tee showed severe/exubertan mv annulous calc with concern of perivalvular abscess. There is also a right atrial wall mass    ------------- 3/24 assessment Mri brain confirms acute stroke bilaterally suggesting central emboli. Repeat bcx on abx has remains negative  I discussed case with dr Johnsie Cancel. He thinks the tee finding is rather odd looking/unusually for endocarditis; I asked if he is concerned about the tee suggestion of paravalvular abscess and he relates that there will be further review of the imaging  From my standpoint, she meets definitive criteria for IE based on clinic picture and [facility] (TEE per cards unusual however). This is complicated further by what appears to be embolic stroke to the brain  A  negative pet scan won't lower my suspicion for IE as this is a high pretest probability IE, and indolent nature of coryne infection  And again with coryne bacterium endocarditis, it would be difficult to treat with abx alone, but reasonable to try if there is no concern of valvular abscess     Plan: Await further cardiology and CTS input Continue vancomycin Discussed with primary team    I spent more than 50 minute reviewing data/chart, and coordinating care and >50% direct face to face time providing counseling/discussing diagnostics/treatment plan with patient   Principal Problem:   Acute bacterial endocarditis Active Problems:   Hyperlipidemia associated with type 2 diabetes mellitus ([place])   Type 2 diabetes mellitus with diabetic chronic kidney disease ([place])   Anemia in chronic kidney disease   History of stroke   ESRD on dialysis [facility])   Multifocal pneumonia   Stroke due to embolism ([place])   Localized swelling of chest wall   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   Allergies  Allergen Reactions   Lisinopril Anaphylaxis and Other (See Comments)    angioedema   Venofer  [Ferric Oxide]     Other reaction(s): Back Pain   Camellia Swelling and Other (See Comments)    Angioedema    Jardiance [Empagliflozin] Swelling and Rash    Scheduled Meds:  aspirin EC  81 mg  Oral Daily   atorvastatin  40 mg Oral Daily   cinacalcet  90 mg Oral Q M,W,F-HD   [START ON 07/29/2022] darbepoetin (ARANESP) injection - DIALYSIS  25 mcg Subcutaneous Q Mon-1800   feeding supplement (NEPRO CARB STEADY)  237 mL Oral BID BM   heparin injection (subcutaneous)  5,000 Units Subcutaneous Q8H   isosorbide-hydrALAZINE  1 tablet Oral TID   lidocaine  1 patch Transdermal Q24H   metoprolol tartrate  12.5 mg Oral BID   pantoprazole  40 mg Oral Daily   polyethylene glycol  17 g Oral Daily   sucroferric oxyhydroxide  500 mg Oral TID WC   Continuous Infusions:  vancomycin 1,000 mg (07/26/22 1743)    PRN Meds:.acetaminophen, calcium carbonate, capsaicin, lidocaine, nitroGLYCERIN   SUBJECTIVE: Feels better no more chest pain Strong left side but over feels very weak still No headache No f/c Mri brain bilateral strokes acute concenring for central emboli  Review of Systems: ROS All other ROS was negative, except mentioned above     OBJECTIVE: Vitals:   07/28/22 0331 07/28/22 0418 07/28/22 0818 07/28/22 0947  BP: 102/60  136/67 133/66  Pulse: 79  81 89  Resp: 16  17   Temp: 98 F (36.7 C)  98.6 F (37 C) 97.8 F (36.6 C)  TempSrc: Oral  Oral Oral  SpO2:   100% 100%  Weight:  91.6 kg    Height:       Body mass index is 28.98 kg/m.  Physical Exam General/constitutional: no distress, pleasant; more alert today and conversant HEENT: Normocephalic, PER, Conj Clear, EOMI, Oropharynx clear Neck supple CV: rrr no mrg Lungs: clear to auscultation, normal respiratory effort Abd: Soft, Nontender Ext: no edema Skin: No Rash Neuro: generalized weakness but much improved/resolved left sided weakness     Lab Results Lab Results  Component Value Date   WBC 7.6 07/28/2022   HGB 10.2 (L) 07/28/2022   HCT 31.9 (L) 07/28/2022   MCV 87.2 07/28/2022   PLT 327 07/28/2022    Lab Results  Component Value Date   CREATININE 8.57 (H) 07/28/2022   BUN 36 (H) 07/28/2022   NA 132 (L) 07/28/2022   K 3.7 07/28/2022   CL 92 (L) 07/28/2022   CO2 26 07/28/2022    Lab Results  Component Value Date   ALT 20 07/25/2022   AST 47 (H) 07/25/2022   ALKPHOS 49 07/25/2022   BILITOT 0.9 07/25/2022      Microbiology: Recent Results (from the past 240 hour(s))  Resp panel by RT-PCR (RSV, Flu A&B, Covid) Anterior Nasal Swab     Status: None   Collection Time: 07/23/22 10:44 PM   Specimen: Anterior Nasal Swab  Result Value Ref Range Status   SARS Coronavirus 2 by RT PCR NEGATIVE NEGATIVE Final   Influenza A by PCR NEGATIVE NEGATIVE Final   Influenza B by PCR NEGATIVE NEGATIVE  Final    Comment: (NOTE) The Xpert Xpress SARS-CoV-2/FLU/RSV plus assay is intended as an aid in the diagnosis of influenza from Nasopharyngeal swab specimens and should not be used as a sole basis for treatment. Nasal washings and aspirates are unacceptable for Xpert Xpress SARS-CoV-2/FLU/RSV testing.  Fact Sheet for Patients: EntrepreneurPulse.com.au  Fact Sheet for Healthcare Providers: IncredibleEmployment.be  This test is not yet approved or cleared by the [place] FDA and has been authorized for detection and/or diagnosis of SARS-CoV-2 by FDA under an Emergency Use Authorization (EUA). This EUA will remain in effect (meaning this test  can be used) for the duration of the COVID-19 declaration under Section 564(b)(1) of the Act, 21 U.S.C. section 360bbb-3(b)(1), unless the authorization is terminated or revoked.     Resp Syncytial Virus by PCR NEGATIVE NEGATIVE Final    Comment: (NOTE) Fact Sheet for Patients: EntrepreneurPulse.com.au  Fact Sheet for Healthcare Providers: IncredibleEmployment.be  This test is not yet approved or cleared by the [place] FDA and has been authorized for detection and/or diagnosis of SARS-CoV-2 by FDA under an Emergency Use Authorization (EUA). This EUA will remain in effect (meaning this test can be used) for the duration of the COVID-19 declaration under Section 564(b)(1) of the Act, 21 U.S.C. section 360bbb-3(b)(1), unless the authorization is terminated or revoked.  Performed at [facility], [place] [address], [place], [place] [address]   Blood culture (routine x 2)     Status: Abnormal   Collection Time: 07/24/22 12:10 AM   Specimen: BLOOD  Result Value Ref Range Status   Specimen Description BLOOD SITE NOT SPECIFIED  Final   Special Requests   Final    BOTTLES DRAWN AEROBIC AND ANAEROBIC Blood Culture adequate volume   Culture  Setup Time   Final     GRAM POSITIVE RODS AEROBIC BOTTLE ONLY CRITICAL RESULT CALLED TO, READ BACK BY AND VERIFIED WITH: Park Breed [id number] FCP Performed at [facility], [place] [address] [place], [place] [address]    Culture (A)  Final    CORYNEBACTERIUM, GROUP JK ENTEROCOCCUS FAECALIS Standardized susceptibility testing for this organism is not available. FOR CORYNEBACTERIUM, GROUP JK    Report Status 07/28/2022 FINAL  Final   Organism ID, Bacteria ENTEROCOCCUS FAECALIS  Final      Susceptibility   Enterococcus faecalis - MIC*    AMPICILLIN <=2 SENSITIVE Sensitive     VANCOMYCIN 2 SENSITIVE Sensitive     GENTAMICIN SYNERGY SENSITIVE Sensitive     * ENTEROCOCCUS FAECALIS  Blood culture (routine x 2)     Status: Abnormal (Preliminary result)   Collection Time: 07/24/22 12:30 AM   Specimen: BLOOD  Result Value Ref Range Status   Specimen Description BLOOD SITE NOT SPECIFIED  Final   Special Requests   Final    BOTTLES DRAWN AEROBIC AND ANAEROBIC Blood Culture adequate volume   Culture  Setup Time   Final    GRAM POSITIVE RODS AEROBIC BOTTLE ONLY CRITICAL RESULT CALLED TO, READ BACK BY AND VERIFIED WITH: PHARMD JEREMY FRENS ON 07/25/22 @ 1436 BY DRT GRAM POSITIVE COCCI ANAEROBIC BOTTLE ONLY CRITICAL RESULT CALLED TO, READ BACK BY AND VERIFIED WITH: PHARMD J. LEDFORD 07/27/22 @ 0259 BY AB    Culture (A)  Final    CORYNEBACTERIUM, GROUP JK Standardized susceptibility testing for this organism is not available. GRAM POSITIVE COCCI IDENTIFICATION TO FOLLOW Performed at [facility], [place] [address], [place],  [address]    Report Status PENDING  Incomplete  Blood Culture ID Panel (Reflexed)     Status: Abnormal   Collection Time: 07/24/22 12:30 AM  Result Value Ref Range Status   Enterococcus faecalis NOT DETECTED NOT DETECTED Final   Enterococcus Faecium NOT DETECTED NOT DETECTED Final   Listeria monocytogenes NOT DETECTED NOT DETECTED Final   Staphylococcus species  DETECTED (A) NOT DETECTED Final    Comment: CRITICAL RESULT CALLED TO, READ BACK BY AND VERIFIED WITH: PHARMD J. LEDFORD 07/27/22 @ 0259 BY AB    Staphylococcus aureus (BCID) NOT DETECTED NOT DETECTED Final  Staphylococcus epidermidis NOT DETECTED NOT DETECTED Final   Staphylococcus lugdunensis NOT DETECTED NOT DETECTED Final   Streptococcus species NOT DETECTED NOT DETECTED Final   Streptococcus agalactiae NOT DETECTED NOT DETECTED Final   Streptococcus pneumoniae NOT DETECTED NOT DETECTED Final   Streptococcus pyogenes NOT DETECTED NOT DETECTED Final   A.calcoaceticus-baumannii NOT DETECTED NOT DETECTED Final   Bacteroides fragilis NOT DETECTED NOT DETECTED Final   Enterobacterales NOT DETECTED NOT DETECTED Final   Enterobacter cloacae complex NOT DETECTED NOT DETECTED Final   Escherichia coli NOT DETECTED NOT DETECTED Final   Klebsiella aerogenes NOT DETECTED NOT DETECTED Final   Klebsiella oxytoca NOT DETECTED NOT DETECTED Final   Klebsiella pneumoniae NOT DETECTED NOT DETECTED Final   Proteus species NOT DETECTED NOT DETECTED Final   Salmonella species NOT DETECTED NOT DETECTED Final   Serratia marcescens NOT DETECTED NOT DETECTED Final   Haemophilus influenzae NOT DETECTED NOT DETECTED Final   Neisseria meningitidis NOT DETECTED NOT DETECTED Final   Pseudomonas aeruginosa NOT DETECTED NOT DETECTED Final   Stenotrophomonas maltophilia NOT DETECTED NOT DETECTED Final   Candida albicans NOT DETECTED NOT DETECTED Final   Candida auris NOT DETECTED NOT DETECTED Final   Candida glabrata NOT DETECTED NOT DETECTED Final   Candida krusei NOT DETECTED NOT DETECTED Final   Candida parapsilosis NOT DETECTED NOT DETECTED Final   Candida tropicalis NOT DETECTED NOT DETECTED Final   Cryptococcus neoformans/gattii NOT DETECTED NOT DETECTED Final    Comment: Performed at [facility], [address], [place], [place] [address]  MRSA Next Gen by PCR, Nasal     Status: None   Collection  Time: 07/24/22 11:14 AM   Specimen: Nasal Mucosa; Nasal Swab  Result Value Ref Range Status   MRSA by PCR Next Gen NOT DETECTED NOT DETECTED Final    Comment: (NOTE) The GeneXpert MRSA Assay (FDA approved for NASAL specimens only), is one component of a comprehensive MRSA colonization surveillance program. It is not intended to diagnose MRSA infection nor to guide or monitor treatment for MRSA infections. Test performance is not FDA approved in patients less than 68 years old. Performed at [facility], [place] [address], [place], [place] [address]   Culture, blood (Routine X 2) w Reflex to ID Panel     Status: None (Preliminary result)   Collection Time: 07/26/22  8:35 AM   Specimen: BLOOD  Result Value Ref Range Status   Specimen Description BLOOD SITE NOT SPECIFIED  Final   Special Requests   Final    BOTTLES DRAWN AEROBIC AND ANAEROBIC Blood Culture adequate volume   Culture   Final    NO GROWTH 2 DAYS Performed at [facility], [address] [place], [place] [address]    Report Status PENDING  Incomplete  Culture, blood (Routine X 2) w Reflex to ID Panel     Status: None (Preliminary result)   Collection Time: 07/26/22  8:40 AM   Specimen: BLOOD  Result Value Ref Range Status   Specimen Description BLOOD SITE NOT SPECIFIED  Final   Special Requests   Final    BOTTLES DRAWN AEROBIC AND ANAEROBIC Blood Culture adequate volume   Culture   Final    NO GROWTH 2 DAYS Performed at [facility], [address] [place], [place] [address]    Report Status PENDING  Incomplete     Serology:   Imaging: If present, new imagings (plain films, ct scans, and mri) have  been personally visualized and interpreted; radiology reports have been reviewed. Decision making incorporated into the Impression / Recommendations.  3/23 mri brain 1. Multiple scattered subcentimeter acute ischemic infarcts involving the bilateral cerebral hemispheres and pons. No associated  hemorrhage or mass effect. A central thromboembolic etiology is suspected given the various vascular distributions involved. 2. Underlying moderately advanced chronic microvascular ischemic disease with a few scattered remote infarcts involving the right frontal lobe and cerebellum.  3/22 tee  1. Left ventricular ejection fraction, by estimation, is 60 to 65%. The left ventricle has normal function. The left ventricle has no regional wall motion abnormalities. There is moderate concentric left ventricular hypertrophy.   2. Right ventricular systolic function is normal. The right ventricular size is normal.   3. Left atrial size was mildly dilated. No left atrial/left atrial  appendage thrombus was detected. The LAA emptying velocity was 70 cm/s.   4. There is a mobile mass in the right atrium (described under tricuspid valve).   5. There is exuberant mitral annulus calcification, up to 2 cm in thickness. There is central hypoechogenicity. This could be due to caseous necrosis or annular abscess. In the medial posterior annulus (corresponding to the medial scallop (P3), the MAC appears unusually mobile, suggesting tissue undermining. There is no clear evidence of communication between the left heart chambers and the mitral annulus cavity. The mitral valve is degenerative. Mild mitral valve regurgitation. Severe mitral annular calcification.   6. There is a highly mobile mass attached to the right atrial wall at the junction of the tricuspid annulus and the aortic annulus, at the right-most segment of the right fibrous trigone. It measures 11 mm in length and 3 mm in thickness and is highly suggestive of vegetation. The tricuspid valve is abnormal. Tricuspid valve regurgitation is mild to moderate.   7. There is a tiny filamentous mobile echodensity on the aortic  noncoronary cusp. This is most likely a Lambl's excrescence, although a vegetation cannot be completely excluded. The large vegetation  described on the transthoracic echo is actually attached to the tricuspid annulus and is located in the right atrium. There is no clear evidence of aortic annulus abscess. The aortic valve is normal in structure. Aortic valve regurgitation is not visualized.   8. There is Moderate (Grade III) layered plaque involving the aortic arch and descending aorta.   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      3/19 ct head/cspine IMPRESSION: CT head:   1. No acute intracranial abnormality. 2. Advanced chronic microvascular ischemic changes of the white matter.   CT cervical spine:   1. No evidence of cervical spine fracture or traumatic subluxation. 2. 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 3. Right lung apex opacity measuring 0.9 x 0.5 cm partially imaged, which may represent pneumonia. Follow-up examination to resolution is recommended.    Jabier Mutton, [place] for [facility] [place] [phone] pager    07/28/2022, 12:32 PM

## 2022-07-28 NOTE — Evaluation (Addendum)
Occupational Therapy Evaluation Patient Details Name: [name] MRN: [id number] DOB: [birth year] Today's Date: 07/28/2022   History of Present Illness 62 y.o. female presents to [facility] hospital on 07/23/2022 with chest pain after being found down at home, found to have NSTEMI and PNA. Code stroke called 3/23 and MRI revealed multiple scattered subcentimeter acute ischemic infarcts  involving the bilateral cerebral hemispheres and pons.   PMH includes DM, ESRD, GERD, HLD, HTN.   Clinical Impression   Pt seen for re-evaluation after MRI revealing multiple scattered infarcts, new imaging with no hemorrhage/mass effect. Pt needing min-total A for ADLs, mod A -max A +2 for bed mobility,and max A -total A+2 for standing from EOB. Pt with LUE deficits and preference for R head turn gaze this session (although family present on pt's R side), and R lateral lean. Pt with decreased cognition, increased time needed to recognize family members in room at end of session, RN notified. Pt presenting with impairments listed below, will follow acutely. Pt would benefit from post-acute rehab (>3 hrs/day) to progress ADLs/functional mobility prior to d/c home.      Recommendations for follow up therapy are one component of a multi-disciplinary discharge planning process, led by the attending physician.  Recommendations may be updated based on patient status, additional functional criteria and insurance authorization.   Follow Up Recommendations  Acute inpatient rehab (3hours/day)     Assistance Recommended at Discharge Frequent or constant Supervision/Assistance  Patient can return home with the following A lot of help with walking and/or transfers;A lot of help with bathing/dressing/bathroom;Assist for transportation;Direct supervision/assist for medications management;Assistance with cooking/housework;Help with stairs or ramp for entrance    Functional Status Assessment  Patient has had a recent decline in their  functional status and demonstrates the ability to make significant improvements in function in a reasonable and predictable amount of time.  Equipment Recommendations  Other (comment) (defer)    Recommendations for Other Services PT consult;Rehab consult     Precautions / Restrictions Precautions Precautions: Fall Restrictions Weight Bearing Restrictions: No      Mobility Bed Mobility Overal bed mobility: Needs Assistance Bed Mobility: Supine to Sit     Supine to sit: Mod assist, +2 for physical assistance Sit to supine: Max assist, +2 for physical assistance   General bed mobility comments: use of bed pad to scoot hips to EOB    Transfers Overall transfer level: Needs assistance Equipment used: 2 person hand held assist Transfers: Sit to/from Stand, Bed to chair/wheelchair/BSC Sit to Stand: Max assist, +2 physical assistance, Total assist           General transfer comment: unable to take/steps/weight shift in standing      Balance Overall balance assessment: Needs assistance Sitting-balance support: Feet supported Sitting balance-Leahy Scale: Poor Sitting balance - Comments: posterior and R lateral lean Postural control: Posterior lean, Right lateral lean Standing balance support: During functional activity, Reliant on assistive device for balance Standing balance-Leahy Scale: Zero                             ADL either performed or assessed with clinical judgement   ADL Overall ADL's : Needs assistance/impaired Eating/Feeding: Minimal assistance;Bed level   Grooming: Minimal assistance;Bed level   Upper Body Bathing: Bed level;Moderate assistance   Lower Body Bathing: Maximal assistance;Bed level   Upper Body Dressing : Bed level;Moderate assistance   Lower Body Dressing: Maximal assistance;Bed level   Toilet  Transfer: Maximal assistance;Total assistance;+2 for physical assistance   Toileting- Clothing Manipulation and Hygiene: Total  assistance       Functional mobility during ADLs: Maximal assistance;Total assistance;+2 for physical assistance       Vision   Additional Comments: increased time tracking to L side, favors looking to R     Perception Perception Perception Tested?: No   Praxis Praxis Praxis tested?: Not tested    Pertinent Vitals/Pain Pain Assessment Pain Assessment: No/denies pain     Hand Dominance Right   Extremity/Trunk Assessment Upper Extremity Assessment Upper Extremity Assessment: LUE deficits/detail LUE Deficits / Details: proximal vs distal weakness, ataxic-like movement with shoulder flexion, can hold arm up against gravity, can flex/ext elbow , grasp 3/5. manually facilitates movement of LUE with RUE unless cued LUE Coordination: decreased gross motor;decreased fine motor   Lower Extremity Assessment Lower Extremity Assessment: Defer to PT evaluation   Cervical / Trunk Assessment Cervical / Trunk Assessment: Other exceptions (pt with R lateral lean)   Communication Communication Communication: No difficulties   Cognition Arousal/Alertness: Awake/alert Behavior During Therapy: Flat affect Overall Cognitive Status: Impaired/Different from baseline Area of Impairment: Safety/judgement, Following commands, Attention, Problem solving                   Current Attention Level: Selective   Following Commands: Follows one step commands with increased time Safety/Judgement: Decreased awareness of deficits, Decreased awareness of safety Awareness: Intellectual Problem Solving: Slow processing, Decreased initiation, Difficulty sequencing, Requires verbal cues, Requires tactile cues General Comments: increased time to state place, states she is at [facility], and it is June, when given hint of month, pt then states it is May. Able to recognize state sister's name upon arrival when sitting EOB, increased time needed to recall sister's name once returned to supine      General Comments  VSS on RA, family present and supportive    Exercises     Shoulder Instructions      Home Living Family/patient expects to be discharged to:: Private residence Living Arrangements: Children Available Help at Discharge: Family;Available PRN/intermittently Type of Home: Apartment Home Access: Stairs to enter Entrance Stairs-Number of Steps: flight Entrance Stairs-Rails: Right Home Layout: One level     Bathroom Shower/Tub: Teacher, early years/pre: Standard Bathroom Accessibility: Yes   Home Equipment: None          Prior Functioning/Environment Prior Level of Function : Independent/Modified Independent             Mobility Comments: ambulatory without DME, 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          OT Problem List: Decreased strength;Decreased activity tolerance;Impaired balance (sitting and/or standing);Decreased cognition;Decreased knowledge of use of DME or AE;Decreased knowledge of precautions;Pain;Cardiopulmonary status limiting activity;Decreased coordination;Decreased safety awareness;Impaired UE functional use      OT Treatment/Interventions: Self-care/ADL training;Energy conservation;DME and/or AE instruction;Therapeutic activities;Cognitive remediation/compensation;Patient/family education;Balance training;Therapeutic exercise;Neuromuscular education;Visual/perceptual remediation/compensation    OT Goals(Current goals can be found in the care plan section) Acute Rehab OT Goals Patient Stated Goal: none stated OT Goal Formulation: With patient/family Time For Goal Achievement: 08/11/22 Potential to Achieve Goals: Good ADL Goals Pt Will Perform Grooming: bed level;sitting;with min guard assist Pt Will Perform Upper Body Dressing: with min assist;sitting;bed level Additional ADL Goal #1: pt wil complete bed mobility with min A in prep for ADLs  OT Frequency: Min 2X/week    Co-evaluation PT/OT/SLP Co-Evaluation/Treatment:  Yes Reason for Co-Treatment: Complexity of the patient's  impairments (multi-system involvement);For patient/therapist safety;To address functional/ADL transfers   OT goals addressed during session: ADL's and self-care;Strengthening/ROM      AM-PAC OT "6 Clicks" Daily Activity     Outcome Measure Help from another person eating meals?: None Help from another person taking care of personal grooming?: A Little Help from another person toileting, which includes using toliet, bedpan, or urinal?: A Lot Help from another person bathing (including washing, rinsing, drying)?: A Lot Help from another person to put on and taking off regular upper body clothing?: A Little Help from another person to put on and taking off regular lower body clothing?: A Lot 6 Click Score: 16   End of Session Equipment Utilized During Treatment: Gait belt Nurse Communication: Mobility status (increased confusion, possibly fatigue related?)  Activity Tolerance: Patient limited by fatigue Patient left: in bed;with call bell/phone within reach;with bed alarm set;with family/visitor present  OT Visit Diagnosis: Unsteadiness on feet (R26.81);Muscle weakness (generalized) (M62.81);History of falling (Z91.81);Other symptoms and signs involving cognitive function;Pain                Time: 1112-1140 OT Time Calculation (min): 28 min Charges:  OT General Charges $OT Visit: 1 Visit OT Evaluation $OT Re-eval: 1 Re-eval  Renaye Rakers, OTD, OTR/L SecureChat Preferred Acute Rehab [phone]  Renaye Rakers [place] 07/28/2022, 12:50 PM

## 2022-07-28 NOTE — Progress Notes (Signed)
OT Cancellation Note  Patient Details Name: [name] MRN: [id number] DOB: 27-Feb-1961   Cancelled Treatment:    Reason Eval/Treat Not Completed: Other (comment) (per RN, pt going for additional imaging to rule out further bleeding. Will follow up for OT evaluation as medically appropriate.)  Renaye Rakers, OTD, OTR/L SecureChat Preferred Acute Rehab [phone] - [place] 07/28/2022, 10:58 AM

## 2022-07-28 NOTE — Progress Notes (Signed)
Speech Language Pathology Treatment: Cognitive-Linquistic  Patient Details Name: TIINA DEBOW MRN: [id number] DOB: [birth year] Today's Date: 07/28/2022 Time: [id number] SLP Time Calculation (min) (ACUTE ONLY): 15 min  Assessment / Plan / Recommendation Clinical Impression  MD sent secure message to SLP to request reassessment of patient's cognition and swallow as patient exhibited some new difficulty with speaking as well as inability to swallow her saliva. MRI brain was completed, which showed multiple scattered subcentimeter acute ischemic infarcts involving the bilateral cerebral hemispheres and pons. When SLP arrived into room, patient completing PT and OT evaluation. Two of her sisters were present in room. Patient continues with cognitive impairment which significantly impacts her attention, awareness, initiation, memory/short term recall but this does not appear to be significantly changed as compared to SLP's evaluation completed yesterday. SLP did screen patient's swallow function and she was able to adequately swallow water (thin liquids) without observed swallow initiation delay or overt s/s aspiration or penetration. Patient did have instance of needing to expectorate some phlegm which she indicated by looking at SLP with lips closed and gesturing in direction of her tissue box. Patient only able to recall very vague details of PT and OT evaluation session, telling SLP that she "sat at edge of bed". She continues to not be oriented to time or situation. Often times, she will give a confused or blank stare when a question was posed. She requires moderate frequency of cues to redirect attention and requires mod-max frequency of question repeating and rephrasing. SLP continues to recommend AIR but will continue to follow while on acute care level.   HPI HPI: Patient is a 62 y.o. female with PMH: GERD, HTN, DM, ESRD on HD, HLD. She presented to the hospital from home on 07/24/22 with chest pain after  being found down at home following an unwittnessed fall. In ED, she was found to have NSTEMI and multifocal PNA. She later met sepsis criteria. On 3/23, Code Stroke was called while patient in unit 6E with c/o aphasia, left sided weakness. CT head did not show acute intracranial process but evaluation was limited by contrast in vasculature; MRI brain ordered but has not yet been completed.      SLP Plan  Continue with current plan of care      Recommendations for follow up therapy are one component of a multi-disciplinary discharge planning process, led by the attending physician.  Recommendations may be updated based on patient status, additional functional criteria and insurance authorization.    Recommendations                   Follow Up Recommendations: Acute inpatient rehab (3hours/day) Assistance recommended at discharge: Frequent or constant Supervision/Assistance SLP Visit Diagnosis: Cognitive communication deficit [id number]) Plan: Continue with current plan of care           Sonia Baller, MA, CCC-SLP Speech Therapy

## 2022-07-28 NOTE — Consult Note (Signed)
[facility] [address]       Brown,[name]             [phone]      Cardiothoracic Surgery Consultation  Reason for Consult: Mitral valve endocarditis Referring Physician: Dr. Mechele Claude is an 62 y.o. female.  HPI:   The patient is a 62 year old woman with a history of type 2 diabetes, hypertension, hyperlipidemia, ESRD on hemodialysis for the past 2 years, who presented after a fall at home when she was found 13 hours later on the floor.  She lives at home with her son.  She does not remember feeling bad before the fall and does not remember the fall itself.  She had been dealing with an upper respiratory infection for the week prior to presentation with cough productive of yellow sputum.  She was complaining of substernal chest pain and was noted to have an elevated troponin of 2155 and an elevated CK of 1948.  She had a CTA of the head and neck which showed no evidence of cervical spine fracture with multilevel degenerative disc disease in the cervical spine.  There was no acute intracranial abnormality but advanced chronic microvascular ischemic changes of the white matter.  He was also noted to have a right lung apical opacity that was only partially imaged but may represent pneumonia.  A 2D echocardiogram showed trivial MR and mild mitral stenosis with a mean mitral gradient 5 mmHg and moderate mitral annular calcification.  There is mild to moderate tricuspid valve regurgitation.  There is a 0.9 cm mobile mass attached to the aortic side of the aortic valve near the annulus suggesting possible vegetation.  Left ventricular ejection fraction was 35 to 40% with mild concentric LVH and grade 1 diastolic dysfunction.  She underwent coronary angiography which showed minor coronary irregularities with no obstructive disease.  A TEE on 07/26/2022 showed exuberant mitral annular calcification noted to be up to 2 cm in thickness with central hypoechogenicity suggesting  either caseous necrosis or annular abscess or both.  There is mild mitral valve regurgitation and severe mitral annular calcification.  There is also a highly mobile mass attached to the right atrial wall at the junction of the tricuspid annulus and aortic annulus measuring 11 mm x 3 mm suggesting vegetation.  There is mild to moderate tricuspid regurgitation.  There is a tiny filamentous mobile echodensity on the aortic noncoronary cusp that was felt to be a Lambl's excrescence.  There is no evidence of aortic annular abscess.  There is moderate layered plaque involving the aortic arch and descending aorta.  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  The posterior mitral leaflet had restricted motion with the entire leaflet being thickened.  The mass extends posteriorly to the coronary sinus.  2/2 blood cultures from 07/24/2022 grew corynebacterium.  Follow-up blood cultures from 07/26/2022 were negative.  Yesterday she developed aphasia and left-sided weakness and a code stroke was called.  MRI of the brain showed multiple scattered subcentimeter acute ischemic infarcts involving the bilateral cerebral hemispheres and pons.  There was underlying moderately advanced chronic microvascular ischemic disease with a few scattered remote infarcts involving the right frontal lobe and cerebellum.  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  Aortic atherosclerosis (HCC)    Arthritis of knee    bilateral   Cerebellar cerebrovascular accident (CVA) without late effect 09/23/2019   Coagulation defect, unspecified ([place]) 02/19/2019   Diabetes mellitus    type 2 - no meds   Diabetic gastroparesis (HCC)    Diverticulosis    ESRD (end stage renal disease) ([place])    Fibroid uterus    GERD (gastroesophageal  reflux disease)    Hyperlipidemia    Hypertension    Hypertensive emergency 02/05/2019   Hypokalemia    IDA (iron deficiency anemia)    Iron deficiency anemia, unspecified 02/24/2019   Nausea    Nausea and vomiting in adult 03/13/2022   Paroxysmal ventricular tachycardia (HCC)    Renal disorder    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[place])    Umbilical hernia    Wears dentures     Past Surgical History:  Procedure Laterality Date   AV FISTULA PLACEMENT Left 02/17/2019   Procedure: ARTERIOVENOUS (AV) FISTULA CREATION LEFT UPPER ARM;  Surgeon: Waynetta Sandy, MD;  Location: [place] [facility];  Service: Vascular;  Laterality: Left;   CATARACT EXTRACTION     right eye   CESAREAN SECTION     x2   CHOLECYSTECTOMY     laparoscopic   CORONARY ANGIOGRAPHY N/A 07/25/2022   Procedure: CORONARY ANGIOGRAPHY;  Surgeon: [name], [name], MD;  Location: [facility];  Service: Cardiovascular;  Laterality: N/A;   FISTULA SUPERFICIALIZATION Left 04/06/2019   Procedure: FISTULA SUPERFICIALIZATION LEFT BRACHIOCEPHALIC;  Surgeon: Waynetta Sandy, MD;  Location: [facility];  Service: Vascular;  Laterality: Left;  Transposition left arm brachiocephalic fistula.   IR FLUORO GUIDE CV LINE RIGHT  02/09/2019   IR FLUORO GUIDE CV LINE RIGHT  03/05/2019   IR US GUIDE VASC ACCESS RIGHT  02/09/2019   MULTIPLE TOOTH EXTRACTIONS     REDUCTION MAMMAPLASTY Bilateral    TEE WITHOUT CARDIOVERSION N/A 07/26/2022   Procedure: TRANSESOPHAGEAL ECHOCARDIOGRAM (TEE);  Surgeon: [name], MD;  Location: [facility];  Service: Cardiovascular;  Laterality: N/A;    Family History  Problem Relation Age of Onset   Hypertension Mother    Diabetes Mother    Cancer Father        lung   Colon cancer Neg Hx    Stomach cancer Neg Hx    Esophageal cancer Neg Hx     Social History:  reports that she quit smoking about 10 years ago. Her smoking use included cigarettes. She has been exposed to  tobacco smoke. She has never used smokeless tobacco. She reports that she does not drink alcohol and does not use drugs.  Allergies:  Allergies  Allergen Reactions   Lisinopril Anaphylaxis and Other (See Comments)    angioedema   Venofer  [Ferric Oxide]     Other reaction(s): Back Pain   Camellia Swelling and Other (See Comments)    Angioedema    Jardiance [Empagliflozin] Swelling and Rash    Medications: I have reviewed the patient's current medications. Prior to Admission:  Medications Prior to Admission  Medication Sig Dispense Refill Last Dose   acetaminophen (TYLENOL) 500 MG tablet Take 1,000 mg by mouth daily as needed for moderate pain.   07/22/2022   amLODipine (NORVASC) 10 MG tablet Take 10 mg by mouth daily.   07/22/2022 at am   aspirin 325 MG tablet Take 325 mg by mouth daily.   07/22/2022 at am   fluticasone (FLONASE) 50 MCG/ACT nasal spray Place 2  sprays into both nostrils daily. (Patient taking differently: Place 2 sprays into both nostrils daily as needed for allergies.) 16 g 6 Past Week   lactulose (CHRONULAC) 10 GM/15ML solution Take 10 g by mouth daily as needed for mild constipation.   Past Month at ran out   metoCLOPramide (REGLAN) 10 MG tablet Take 1 tablet (10 mg total) by mouth 4 (four) times daily -  before meals and at bedtime. Take 1/2 to 1 tablet prn 30 tablet 1 07/22/2022   Multiple Vitamin (DAILY-VITE) TABS Take 1 tablet by mouth daily.   07/22/2022   pantoprazole (PROTONIX) 40 MG tablet Take 1 tablet (40 mg total) by mouth daily. 90 tablet 3 07/22/2022   polyethylene glycol (MIRALAX / GLYCOLAX) 17 g packet Take 17 g by mouth daily. 14 each 0 07/22/2022   sucroferric oxyhydroxide (VELPHORO) 500 MG chewable tablet Chew 500 mg by mouth 3 (three) times daily with meals.   07/22/2022 at pm   traMADol (ULTRAM) 50 MG tablet Take 50 mg by mouth in the morning, at noon, in the evening, and at bedtime.   07/22/2022 at pm   Accu-Chek Softclix Lancets lancets Use as instructed  (Patient taking differently: 1 each by Other route See admin instructions. Use as instructed) 100 each 12    Blood Glucose Monitoring Suppl (ACCU-CHEK AVIVA PLUS) w/Device KIT Use to check sugar three times a day (Patient taking differently: 1 each by Other route in the morning, at noon, and at bedtime.) 1 kit 0    glucose blood (ACCU-CHEK AVIVA PLUS) test strip Use as instructed (Patient taking differently: 1 each by Other route See admin instructions. Use as instructed) 100 each 12    Lancets (ACCU-CHEK SOFT TOUCH) lancets Use to check sugars three times a day (Patient taking differently: 1 each by Other route in the morning, at noon, and at bedtime.) 100 each 12    rosuvastatin (CRESTOR) 5 MG tablet Take 1 tablet (5 mg total) by mouth daily. (Patient not taking: Reported on 07/24/2022) 30 tablet 2 Not Taking   Scheduled:  aspirin EC  81 mg Oral Daily   atorvastatin  40 mg Oral Daily   cinacalcet  90 mg Oral Q M,W,F-HD   [START ON 07/29/2022] darbepoetin (ARANESP) injection - DIALYSIS  25 mcg Subcutaneous Q Mon-1800   feeding supplement (NEPRO CARB STEADY)  237 mL Oral BID BM   heparin injection (subcutaneous)  5,000 Units Subcutaneous Q8H   isosorbide-hydrALAZINE  1 tablet Oral TID   lidocaine  1 patch Transdermal Q24H   metoprolol tartrate  12.5 mg Oral BID   pantoprazole  40 mg Oral Daily   polyethylene glycol  17 g Oral Daily   sucroferric oxyhydroxide  500 mg Oral TID WC   Continuous:  vancomycin 1,000 mg (07/26/22 1743)   [id number], calcium carbonate, capsaicin, lidocaine, nitroGLYCERIN  Results for orders placed or performed during the hospital encounter of 07/23/22 (from the past 48 hour(s))  CBC     Status: Abnormal   Collection Time: 07/26/22  2:14 PM  Result Value Ref Range   WBC 7.4 4.0 - 10.5 K/uL   RBC 3.81 (L) 3.87 - 5.11 MIL/uL   Hemoglobin 10.6 (L) 12.0 - 15.0 g/dL   HCT 33.1 (L) 36.0 - 46.0 %   MCV 86.9 80.0 - 100.0 fL   MCH 27.8 26.0 - 34.0 pg   MCHC 32.0  30.0 - 36.0 g/dL   RDW 14.7 11.5 - 15.5 %   Platelets 260 150 -  400 K/uL   nRBC 0.0 0.0 - 0.2 %    Comment: Performed at [facility], [place] [address], [place], [place]  CBC     Status: Abnormal   Collection Time: 07/27/22  1:26 AM  Result Value Ref Range   WBC 8.1 4.0 - 10.5 K/uL   RBC 3.94 3.87 - 5.11 MIL/uL   Hemoglobin 11.1 (L) 12.0 - 15.0 g/dL   HCT 33.8 (L) 36.0 - 46.0 %   MCV 85.8 80.0 - 100.0 fL   MCH 28.2 26.0 - 34.0 pg   MCHC 32.8 30.0 - 36.0 g/dL   RDW 14.6 11.5 - 15.5 %   Platelets 296 150 - 400 K/uL   nRBC 0.0 0.0 - 0.2 %    Comment: Performed at [facility], [place] [address] [place], [place] [address]  Glucose, capillary     Status: None   Collection Time: 07/27/22 10:52 AM  Result Value Ref Range   Glucose-Capillary 82 70 - 99 mg/dL    Comment: Glucose reference range applies only to samples taken after fasting for at least 8 hours.  Renal function panel     Status: Abnormal   Collection Time: 07/27/22  3:05 PM  Result Value Ref Range   Sodium 130 (L) 135 - 145 mmol/L   Potassium 3.8 3.5 - 5.1 mmol/L   Chloride 91 (L) 98 - 111 mmol/L   CO2 24 22 - 32 mmol/L   Glucose, Bld 164 (H) 70 - 99 mg/dL    Comment: Glucose reference range applies only to samples taken after fasting for at least 8 hours.   BUN 34 (H) 8 - 23 mg/dL   Creatinine, Ser 7.63 (H) 0.44 - 1.00 mg/dL   Calcium 8.0 (L) 8.9 - 10.3 mg/dL   Phosphorus 5.0 (H) 2.5 - 4.6 mg/dL   Albumin 2.8 (L) 3.5 - 5.0 g/dL   GFR, Estimated 6 (L) >60 mL/min    Comment: (NOTE) Calculated using the CKD-EPI Creatinine Equation (2021)    Anion gap 15 5 - 15    Comment: Performed at [facility] [address] [place], [place] [address]  CBC     Status: Abnormal   Collection Time: 07/28/22  2:45 AM  Result Value Ref Range   WBC 7.6 4.0 - 10.5 K/uL   RBC 3.66 (L) 3.87 - 5.11 MIL/uL   Hemoglobin 10.2 (L) 12.0 - 15.0 g/dL   HCT 31.9 (L) 36.0 - 46.0 %   MCV 87.2 80.0 - 100.0 fL   MCH 27.9  26.0 - 34.0 pg   MCHC 32.0 30.0 - 36.0 g/dL   RDW 14.8 11.5 - 15.5 %   Platelets 327 150 - 400 K/uL   nRBC 0.0 0.0 - 0.2 %    Comment: Performed at [facility], [place] [address], [place], [place] [address]  Renal function panel     Status: Abnormal   Collection Time: 07/28/22  2:45 AM  Result Value Ref Range   Sodium 132 (L) 135 - 145 mmol/L   Potassium 3.7 3.5 - 5.1 mmol/L   Chloride 92 (L) 98 - 111 mmol/L   CO2 26 22 - 32 mmol/L   Glucose, Bld 83 70 - 99 mg/dL    Comment: Glucose reference range applies only to samples taken after fasting for at least 8 hours.   BUN 36 (H) 8 - 23 mg/dL   Creatinine, Ser 8.57 (H) 0.44 - 1.00 mg/dL  Calcium 8.4 (L) 8.9 - 10.3 mg/dL   Phosphorus 5.6 (H) 2.5 - 4.6 mg/dL   Albumin 2.8 (L) 3.5 - 5.0 g/dL   GFR, Estimated 5 (L) >60 mL/min    Comment: (NOTE) Calculated using the CKD-EPI Creatinine Equation (2021)    Anion gap 14 5 - 15    Comment: Performed at [facility] [address], [place], [place]  Glucose, capillary     Status: None   Collection Time: 07/28/22  8:38 AM  Result Value Ref Range   Glucose-Capillary 88 70 - 99 mg/dL    Comment: Glucose reference range applies only to samples taken after fasting for at least 8 hours.    CT HEAD WO CONTRAST (5MM)  Result Date: 07/28/2022 CLINICAL DATA:  62 year old female code stroke presentation yesterday. Numerous scattered small embolic appearing infarcts on brain MRI. EXAM: CT HEAD WITHOUT CONTRAST TECHNIQUE: Contiguous axial images were obtained from the base of the skull through the vertex without intravenous contrast. RADIATION DOSE REDUCTION: This exam was performed according to the departmental dose-optimization program which includes automated exposure control, adjustment of the mA and/or kV according to patient size and/or use of iterative reconstruction technique. COMPARISON:  Head CT, CTA head and neck, cardiac CTA, and brain MRI yesterday. FINDINGS: Brain:  Intravascular contrast persists. This limits evaluation for hyperdense intracranial hemorrhage. No midline shift or intracranial mass effect. No ventriculomegaly. No IVH is evident. Confluent bilateral cerebral white matter hypodensity redemonstrated. The scattered small gray and white matter infarcts demonstrated by MRI yesterday remain largely occult by CT. No superimposed major vascular territory infarct identified. Vascular: Abundant intravascular contrast persists, query renal insufficiency if no repeat contrast dose since yesterday. The major intracranial vascular structures are enhancing as expected. Skull: No acute osseous abnormality identified. Sinuses/Orbits: Small sinus fluid levels, scattered bubbly opacity and mucosal thickening has not significantly changed. Tympanic cavities and mastoids remain well aerated. Other: No acute orbit or scalp soft tissue finding. IMPRESSION: 1. Abundant intravascular contrast persists, query Renal Insufficiency if no repeat iodinated contrast dose since yesterday. 2. Small embolic cerebral infarcts remain occult by CT. No intracranial mass effect. No definite acute intracranial hemorrhage. 3. Chronic white matter disease. 4. Paranasal sinus inflammation. Electronically Signed   By: Genevie Ann M.D.   On: 07/28/2022 09:53   MR BRAIN WO CONTRAST  Result Date: 07/28/2022 CLINICAL DATA:  Initial evaluation for neuro deficit, stroke suspected. EXAM: MRI HEAD WITHOUT CONTRAST TECHNIQUE: Multiplanar, multiecho pulse sequences of the brain and surrounding structures were obtained without intravenous contrast. COMPARISON:  Prior CTs from earlier the same day. FINDINGS: Brain: Cerebral volume within normal limits. Patchy and confluent T2/FLAIR hyperintensity involving the periventricular deep white matter both cerebral hemispheres as well as the pons, consistent with chronic small vessel ischemic disease, moderately advanced in nature. Small remote cortical subcortical infarct  involving the right frontal lobe noted. Few small remote cerebellar infarcts noted as well. Multiple scattered foci of restricted diffusion are seen involving the bilateral cerebral hemispheres, consistent with acute ischemic infarcts. For reference purposes, the largest of these foci is seen at the subcortical left frontal lobe in measures 1 cm (series 7, image 58). Patchy involvement of the ventral pons noted as well (series 5, image 69). No associated hemorrhage or mass effect. A central thromboembolic etiology is suspected given the various vascular distributions involved. No mass lesion, midline shift or mass effect. No hydrocephalus or extra-axial fluid collection. Pituitary gland and suprasellar region within normal limits. Vascular:  Major intracranial vascular flow voids are maintained. Skull and upper cervical spine: Craniocervical junction within normal limits. Bone marrow signal intensity normal. No scalp soft tissue abnormality. Sinuses/Orbits: Prior bilateral ocular lens replacement. Scattered mucosal thickening present throughout the paranasal sinuses. Small volume layering secretions present within the left sphenoid and maxillary sinuses. No mastoid effusion. Other: None. IMPRESSION: 1. Multiple scattered subcentimeter acute ischemic infarcts involving the bilateral cerebral hemispheres and pons. No associated hemorrhage or mass effect. A central thromboembolic etiology is suspected given the various vascular distributions involved. 2. Underlying moderately advanced chronic microvascular ischemic disease with a few scattered remote infarcts involving the right frontal lobe and cerebellum. Electronically Signed   By: Jeannine Boga M.D.   On: 07/28/2022 00:37   DG Chest 1 View  Result Date: 07/27/2022 CLINICAL DATA:  Endocarditis EXAM: PORTABLE CHEST 1 VIEW COMPARISON:  07/23/2022 FINDINGS: Cardiac shadow is enlarged but stable. Aortic calcifications are seen. Previously seen patchy  infiltrates have resolved in the interval. No new focal infiltrate is seen. No bony abnormality is noted. IMPRESSION: No active disease. Electronically Signed   By: Inez Catalina M.D.   On: 07/27/2022 21:36   CT CORONARY MORPH W/CTA COR W/SCORE W/CA W/CM &/OR WO/CM  Addendum Date: 07/27/2022   ADDENDUM REPORT: 07/27/2022 12:11 CLINICAL DATA:  This over-read does not include interpretation of cardiac or coronary anatomy or pathology. The coronary CTA interpretation by the cardiologist is attached. COMPARISON:  None available. FINDINGS: Patchy areas of ground-glass opacity are seen throughout the visualized portions of both lungs, which nonspecific and could be due to edema, atypical infection, or acute lung injury. No evidence of pulmonary consolidation no suspicious nodules or masses are identified in the visualized portion of the lungs. No pleural fluid seen. The visualized portions of the mediastinum and hilar regions are unremarkable. IMPRESSION: Heterogeneous ground-glass opacity throughout visualized portions of both lungs. This finding is nonspecific and could be due to edema, atypical infection, or acute lung injury. Electronically Signed   By: Marlaine Hind M.D.   On: 07/27/2022 12:11   Result Date: 07/27/2022 CLINICAL DATA:  Question SBE with Vegetations Abnormal Mitral Valve EXAM: Cardiac CT TECHNIQUE: The patient was scanned on a Siemens Force [id number] slice scanner. A 120 kV retrospective scan was triggered in the ascending thoracic aorta at 140 HU's. Gantry rotation speed was 250 msecs and collimation was .6 mm. No beta blockade or nitro were given. The 3D data set was reconstructed in 5% intervals of the R-R cycle. Systolic and diastolic phases were analyzed on a dedicated work station using MPR, MIP and VRT modes. The patient received 80 cc of contrast. FINDINGS: Mild bi atrial enlargement No ASD/VSD/PFO. No pericardial effusion Normal ascending thoracic aorta 2.9 cm Moderate calcific atherosclerosis  with normal arch vessels AV: Tri leaflet No vegetation noted There is a small area of apparent contrast enhancement from non coronary sinus to the RA Cannot r/o fistula but correlates with mobile mass seen on TEE measuring 7.7 mm x 4.1 mm Non coronary sinus: 2.85 cm Right coronary sinus: 2.84 cm Left coronary sinus: 2.74 cm MV: Markedly abnormal posterior annulus. Mass on ventricular side of leaflet measuring 1.9 cm x 1.5 cm partially calcified but mixed tissue density with HU's 150 to 250 Apears to be some contrast enhancement The posterior leaflet has restricted motion with entire leaflet being thickened 1.2 cm x 2.9 cm The overall appearance appears more to be and annular abscess rather than caseating mitral annular calcification The mass extends posteriorly  toward the coronary sinus The non contrast images show calcified annular edges through out the mitral annulus with edge HU's as high as 650 The core area inside the MAC measures only 60-90 HU's TV: Not well seen with contrast wash out in RV PV: Not well seen with contrast wash out in RVOT Coronary Arteries: 1-24% calcified plaque in proximal and mid vessel 25-49% calcified plaque in PDA/PLB LM is normal LAD with 25-40% calcified plaque in proximal and mid vessel Normal diagonal and IM branches LCX 1024% soft plaque proximally and 1024% calcified plaque distally . IMPRESSION: 1. CAD RADS 2 non obstructive CAD see description above study done without nitro or beta blocker 2. 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 Contrast enhancement and large central area of mixed HU's cannot really tell if severe caseating MAC vs annular abscess but latter may be more likely FDG PET CT would likely be best distinguishing test between degenerative disease vs infection/inflammation 3. Contrast enhancing small mass at base of septal TV leaflet near non coronary aortic annulus Jenkins Rouge Electronically Signed: By: Jenkins Rouge M.D. On: 07/27/2022 11:39    CT ANGIO HEAD NECK W WO CM (CODE STROKE)  Result Date: 07/27/2022 CLINICAL DATA:  Aphasia and left-sided weakness EXAM: CT ANGIOGRAPHY HEAD AND NECK TECHNIQUE: Multidetector CT imaging of the head and neck was performed using the standard protocol during bolus administration of intravenous contrast. Multiplanar CT image reconstructions and MIPs were obtained to evaluate the vascular anatomy. Carotid stenosis measurements (when applicable) are obtained utilizing NASCET criteria, using the distal internal carotid diameter as the denominator. RADIATION DOSE REDUCTION: This exam was performed according to the departmental dose-optimization program which includes automated exposure control, adjustment of the mA and/or kV according to patient size and/or use of iterative reconstruction technique. CONTRAST:  67mL OMNIPAQUE IOHEXOL 350 MG/ML SOLN COMPARISON:  09/18/2019 CTA head and neck, correlation is also made with CT head 07/27/2022 FINDINGS: CT HEAD FINDINGS For noncontrast findings, please see same day CT head. CTA NECK FINDINGS Aortic arch: Standard branching. Imaged portion shows no evidence of aneurysm or dissection. Aortic atherosclerosis. Approximately 80% stenosis at the origin of the left ICA (series 9, image 127 and series 7, image 326). Right carotid system: No evidence of dissection, occlusion, or hemodynamically significant stenosis (greater than 50%). Atherosclerotic disease at the bifurcation and in the proximal ICA is not hemodynamically significant. Left carotid system: No evidence of dissection, occlusion, or hemodynamically significant stenosis (greater than 50%). Atherosclerotic disease at the bifurcation and in the proximal ICA is not hemodynamically significant. Vertebral arteries: Mild stenosis at the origin of the left vertebral artery, with additional moderate stenosis in the left V2 segment (series 7, image 2917). The right vertebral artery is patent from its origin to the skull base  without significant stenosis. No evidence of dissection. Skeleton: No acute osseous abnormality. Degenerative changes in the cervical spine. Other neck: Negative. Upper chest: Ground-glass opacities in the right lung. No pleural effusion. Review of the MIP images confirms the above findings CTA HEAD FINDINGS Anterior circulation: Both internal carotid arteries are patent to the termini, with moderate stenosis in the right cavernous and left supraclinoid ICA. A1 segments patent. Normal anterior communicating artery. Anterior cerebral arteries are patent to their distal aspects. No M1 stenosis or occlusion. MCA branches perfused and symmetric. Posterior circulation: Vertebral arteries patent to the vertebrobasilar junction without stenosis. Basilar patent to its distal aspect. Superior cerebellar arteries patent proximally. Patent P1 segments, hypoplastic on the right.  Near fetal origin of the right PCA from the right posterior communicating artery. The left posterior communicating artery is also patent. PCAs are somewhat irregular perfused to their distal aspects without focal stenosis. Venous sinuses: As permitted by contrast timing, patent. Anatomic variants: None significant. Review of the MIP images confirms the above findings IMPRESSION: 1. No intracranial large vessel occlusion. 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 2. Approximately 80% stenosis at the origin of the left ICA. Mild stenosis at the left vertebral artery origin and moderate stenosis in the left V2. No other hemodynamically significant stenosis in the neck. 3. Ground-glass opacities in the imaged right lung, which could be infectious or inflammatory. Consider CT chest for further evaluation. 4. Aortic atherosclerosis. Aortic Atherosclerosis (ICD10-I70.0). Imaging results were communicated on 07/27/2022 at 11:43 am to provider Dr. Rory Percy via secure text paging. Electronically Signed   By: Merilyn Baba M.D.   On:  07/27/2022 11:43   CT HEAD CODE STROKE WO CONTRAST  Result Date: 07/27/2022 CLINICAL DATA:  Code stroke.  Aphasia and left-sided weakness EXAM: CT HEAD WITHOUT CONTRAST TECHNIQUE: Contiguous axial images were obtained from the base of the skull through the vertex without intravenous contrast. RADIATION DOSE REDUCTION: This exam was performed according to the departmental dose-optimization program which includes automated exposure control, adjustment of the mA and/or kV according to patient size and/or use of iterative reconstruction technique. COMPARISON:  07/23/2022 CT head FINDINGS: Brain: No evidence of acute infarction, hemorrhage, mass, mass effect, or midline shift. No hydrocephalus or extra-axial collection. Vascular: Contrast is noted with vasculature from same day CT heart, which limits evaluation for hyperdense vessel. Skull: Negative for fracture or focal lesion. Sinuses/Orbits: Mucosal thickening in the ethmoid air cells and left sphenoid sinus, with air-fluid levels in the maxillary sinuses. Status post bilateral lens replacements. Other: The mastoid air cells are well aerated. ASPECTS [facility] Stroke Program Early CT Score) - Ganglionic level infarction (caudate, lentiform nuclei, internal capsule, insula, M1-M3 cortex): 7 - Supraganglionic infarction (M4-M6 cortex): 3 Total score (0-10 with 10 being normal): 10 IMPRESSION: 1. Evaluation is somewhat limited by contrast in the vasculature from recent CT heart study. Within this limitation, no acute intracranial process. 2. Air-fluid levels in the maxillary sinuses, which can be seen with acute sinusitis in the appropriate clinical setting. 3. ASPECTS is 10. Imaging results were communicated on 07/27/2022 at 11:14 am to provider Dr. Rory Percy via secure text paging. Electronically Signed   By: Merilyn Baba M.D.   On: 07/27/2022 11:14    Review of Systems  Constitutional:  Positive for appetite change and unexpected weight change. Negative for fever.   HENT: Negative.    Eyes: Negative.   Respiratory:  Positive for cough. Negative for shortness of breath.   Cardiovascular:  Positive for chest pain. Negative for leg swelling.  Gastrointestinal: Negative.   Genitourinary: Negative.   Musculoskeletal: Negative.   Allergic/Immunologic: Negative.   Neurological:  Negative for dizziness, syncope and light-headedness.  Hematological: Negative.   Psychiatric/Behavioral: Negative.     Blood pressure (!) 105/58, pulse 81, temperature (!) 97.4 F (36.3 C), temperature source Oral, resp. rate 17, height 5\' 10"  (1.778 m), weight 91.6 kg, SpO2 100 %. Physical Exam Constitutional:      Appearance: Normal appearance.  HENT:     Head: Normocephalic and atraumatic.  Eyes:     Extraocular Movements: Extraocular movements intact.     Pupils: Pupils are equal, round, and reactive to light.  Cardiovascular:  Rate and Rhythm: Normal rate and regular rhythm.     Pulses: Normal pulses.     Heart sounds: Normal heart sounds. No murmur heard. Pulmonary:     Effort: Pulmonary effort is normal.     Breath sounds: Normal breath sounds.  Abdominal:     General: Bowel sounds are normal. There is no distension.     Tenderness: There is no abdominal tenderness.  Musculoskeletal:        General: No swelling.     Cervical back: Neck supple.  Skin:    General: Skin is warm and dry.  Neurological:     Mental Status: She is alert.     Comments: Left leg weakness.  Cannot lift off bed.    TRANSESOPHOGEAL ECHO REPORT       Patient Name:   [name] Date of Exam: 07/26/2022  Medical Rec #:  [id number]    Height:       70.0 in  Accession #:    [id number]   Weight:       203.0 lb  Date of Birth:  [birth year]    BSA:          2.101 m  Patient Age:    40 years     BP:           111/65 mmHg  Patient Gender: F            HR:           88 bpm.  Exam Location:  Inpatient   Procedure: Transesophageal Echo, 3D Echo, Cardiac Doppler and Color  Doppler    Indications:   Aortic valve vegetation    History:        Patient has prior history of Echocardiogram examinations,  most                 recent 07/24/2022. Arrythmias:Tachycardia; Risk                  Factors:Hypertension, Dyslipidemia and Diabetes. ESRD.    Sonographer:    Eartha Inch  Referring Phys: [id number] [place]: After discussion of the risks and benefits of a TEE, an  informed consent was obtained from the patient. TEE procedure time was 12  minutes. The transesophogeal probe was passed without difficulty through  the esophogus of the patient. Imaged  were obtained with the patient in a left lateral decubitus position.  Sedation performed by different physician. The patient was monitored while  under deep sedation. Anesthestetic sedation was provided intravenously by  Anesthesiology: 132.89mg  of  Propofol. Image quality was good. The patient's vital signs; including  heart rate, blood pressure, and oxygen saturation; remained stable  throughout the procedure. The patient developed no complications during  the procedure.    IMPRESSIONS     1. Left ventricular ejection fraction, by estimation, is 60 to 65%. The  left ventricle has normal function. The left ventricle has no regional  wall motion abnormalities. There is moderate concentric left ventricular  hypertrophy.   2. Right ventricular systolic function is normal. The right ventricular  size is normal.   3. Left atrial size was mildly dilated. No left atrial/left atrial  appendage thrombus was detected. The LAA emptying velocity was 70 cm/s.   4. There is a mobile mass in the right atrium (described under tricuspid  valve).   5. There is exuberant mitral annulus calcification, up to 2 cm in  thickness. There is central  hypoechogenicity. This could be due to caseous  necrosis or annular abscess. In the medial posterior annulus  (corresponding to the medial scallop (P3), the MAC  appears  unusually mobile, suggesting tissue undermining. There is no clear  evidence of communication between the left heart chambers and the mitral  annulus cavity. The mitral valve is degenerative. Mild mitral valve  regurgitation. Severe mitral annular  calcification.   6. There is a highly mobile mass attached to the right atrial wall at the  junction of the tricuspid annulus and the aortic annulus, at the  right-most segment of the right fibrous trigone. It measures 11 mm in  length and 3 mm in thickness and is highly  suggestive of vegetation. The tricuspid valve is abnormal. Tricuspid valve  regurgitation is mild to moderate.   7. There is a tiny filamentous mobile echodensity on the aortic  noncoronary cusp. This is most likely a Lambl's excrescence, although a  vegetation cannot be completely excluded. The large vegetation described  on the transthoracic echo is actually  attached to the tricuspid annulus and is located in the right atrium.  There is no clear evidence of aortic annulus abscess. The aortic valve is  normal in structure. Aortic valve regurgitation is not visualized.   8. There is Moderate (Grade III) layered plaque involving the aortic arch  and descending aorta.   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   FINDINGS   Left Ventricle: Left ventricular ejection fraction, by estimation, is 60  to 65%. The left ventricle has normal function. The left ventricle has no  regional wall motion abnormalities. The left ventricular internal cavity  size was normal in size. There is   moderate  concentric left ventricular hypertrophy.   Right Ventricle: The right ventricular size is normal. No increase in  right ventricular wall thickness. Right ventricular systolic function is  normal.   Left Atrium: Left atrial size was mildly dilated. No left atrial/left  atrial appendage thrombus was detected. The LAA emptying velocity was 70  cm/s.   Right Atrium: Right atrial size was normal in size. There is a mobile mass  in the right atrium (described under tricuspid valve).   Pericardium: There is no evidence of pericardial effusion.   Mitral Valve: There is exuberant mitral annulus calcification, up to 2 cm  in thickness. There is central hypoechogenicity. This could be due to  caseous necrosis or annular abscess. In the medial posterior annulus  (corresponding to the medial scallop  (P3), the MAC appears unusually mobile, suggesting tissue undermining.  There is no clear evidence of communication between the left heart  chambers and the mitral annulus cavity. The mitral valve is degenerative  in appearance. Severe mitral annular  calcification. Mild mitral valve regurgitation.   Tricuspid Valve: There is a highly mobile mass attached to the right  atrial wall at the junction of the tricuspid annulus and the aortic  annulus, at the right-most segment of the right fibrous trigone. It  measures 11 mm in length and 3 mm in thickness  and is highly suggestive of vegetation. The tricuspid valve is abnormal.  Tricuspid valve regurgitation is mild to moderate.  Aortic Valve: There is a tiny filamentous mobile echodensity on the aortic  noncoronary cusp. This is most likely a Lambl's excrescence, although a  vegetation cannot be completely excluded. The large vegetation described  on the transthoracic echo is  actually attached to the tricuspid annulus and is located in the right  atrium. There is no clear evidence of aortic annulus abscess. The aortic  valve is normal in  structure. Aortic valve regurgitation is not  visualized.   Pulmonic Valve: The pulmonic valve was normal in structure. Pulmonic valve  regurgitation is trivial.   Aorta: The aortic root, ascending aorta, aortic arch and descending aorta  are all structurally normal, with no evidence of dilitation or  obstruction. There is moderate (Grade III) layered plaque involving the  aortic arch and descending aorta.   IAS/Shunts: No atrial level shunt detected by color flow Doppler. There is  no evidence of an atrial septal defect.   Additional Comments: Spectral Doppler performed.   [name] MD  Electronically signed by [name] MD  Signature Date/Time: 07/26/2022/2:44:38 PM        Final      ADDENDUM REPORT: 07/27/2022 12:11   CLINICAL DATA:  This over-read does not include interpretation of cardiac or coronary anatomy or pathology. The coronary CTA interpretation by the cardiologist is attached.   COMPARISON:  None available.   FINDINGS: Patchy areas of ground-glass opacity are seen throughout the visualized portions of both lungs, which nonspecific and could be due to edema, atypical infection, or acute lung injury. No evidence of pulmonary consolidation no suspicious nodules or masses are identified in the visualized portion of the lungs. No pleural fluid seen.   The visualized portions of the mediastinum and hilar regions are unremarkable.   IMPRESSION: Heterogeneous ground-glass opacity throughout visualized portions of both lungs. This finding is nonspecific and could be due to edema, atypical infection, or acute lung injury.     Electronically Signed   By: Marlaine Hind M.D.   On: 07/27/2022 12:11    Addended by Earle Gell, MD on 07/27/2022 12:13 PM    Study Result  Narrative & Impression  CLINICAL DATA:  Question SBE with Vegetations Abnormal Mitral Valve   EXAM: Cardiac CT   TECHNIQUE: The patient was scanned on a Siemens Force [id number] slice  scanner. A 120 kV retrospective scan was triggered in the ascending thoracic aorta at 140 HU's. Gantry rotation speed was 250 msecs and collimation was .6 mm. No beta blockade or nitro were given. The 3D data set was reconstructed in 5% intervals of the R-R cycle. Systolic and diastolic phases were analyzed on a dedicated work station using MPR, MIP and VRT modes. The patient received 80 cc of contrast.   FINDINGS: Mild bi atrial enlargement No ASD/VSD/PFO. No pericardial effusion Normal ascending thoracic aorta 2.9 cm Moderate calcific atherosclerosis with normal arch vessels   AV: Tri leaflet No vegetation noted There is a small area of apparent contrast enhancement from non coronary sinus to the RA Cannot r/o fistula but correlates with mobile mass seen on TEE measuring 7.7 mm x 4.1 mm   Non coronary sinus: 2.85 cm   Right coronary sinus: 2.84 cm   Left coronary sinus: 2.74 cm   MV: Markedly abnormal posterior annulus. Mass on ventricular side of leaflet measuring 1.9 cm x 1.5 cm partially calcified but mixed tissue density with HU's 150 to 250 Apears to be some contrast enhancement The posterior leaflet has restricted motion with entire  leaflet being thickened 1.2 cm x 2.9 cm The overall appearance appears more to be and annular abscess rather than caseating mitral annular calcification The mass extends posteriorly toward the coronary sinus   The non contrast images show calcified annular edges through out the mitral annulus with edge HU's as high as 650 The core area inside the MAC measures only 60-90 HU's   TV: Not well seen with contrast wash out in RV   PV: Not well seen with contrast wash out in RVOT   Coronary Arteries: 1-24% calcified plaque in proximal and mid vessel 25-49% calcified plaque in PDA/PLB LM is normal LAD with 25-40% calcified plaque in proximal and mid vessel Normal diagonal and IM branches LCX 1024% soft plaque proximally and 1024% calcified  plaque distally .   IMPRESSION: 1. CAD RADS 2 non obstructive CAD see description above study done without nitro or beta blocker   2. 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 Contrast enhancement and large central area of mixed HU's cannot really tell if severe caseating MAC vs annular abscess but latter may be more likely FDG PET CT would likely be best distinguishing test between degenerative disease vs infection/inflammation   3. Contrast enhancing small mass at base of septal TV leaflet near non coronary aortic annulus   Jenkins Rouge   Electronically Signed: By: Jenkins Rouge M.D. On: 07/27/2022 11:39    Assessment/Plan:  This 62 year old woman was admitted after a fall of unclear etiology.  She does not remember any of the details about it and there were no witnesses but she laid on the floor for about 13 hours.  I would have to suspect a possible arrhythmia.  She has positive blood cultures for corynebacterium and a complex mass in the posterior mitral annulus that could be severe caseating MAC or an annular abscess or both.  She has severe mitral annular calcification.  There is only mild mitral regurgitation and no mitral stenosis.  There is also a mass in the right atrium suggesting vegetation.  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  I think she would be a poor operative candidate at this time given her comorbidities including end-stage renal disease, malnutrition, recent stroke, reduced ejection fraction, limited functional status at home, and the complexity of preparing a possible abscess of this extent with the severely calcified posterior mitral annulus.  This would be very difficult to reconstruct with significant risk of atrioventricular disconnection and injury to the coronary sinus or  left circumflex coronary arteries.  I would recommend continuing intravenous antibiotics and stroke rehabilitation at this time.  I discussed all this with the patient and her present family and answered their questions.  Gaye Pollack 07/28/2022, 1:58 PM

## 2022-07-28 NOTE — Progress Notes (Addendum)
STROKE TEAM PROGRESS NOTE   INTERVAL HISTORY Her daughter is at the bedside.  Patient is sitting in bed in no apparent distress.  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  Vitals:   07/28/22 0418 07/28/22 0818 07/28/22 0947 07/28/22 1250  BP:  136/67 133/66 (!) 105/58  Pulse:  81 89 81  Resp:  17  17  Temp:  98.6 F (37 C) 97.8 F (36.6 C) (!) 97.4 F (36.3 C)  TempSrc:  Oral Oral Oral  SpO2:  100% 100% 100%  Weight: 91.6 kg     Height:       CBC:  Recent Labs  Lab 07/23/22 2244 07/23/22 2252 07/27/22 0126 07/28/22 0245  WBC 12.6*   < > 8.1 7.6  NEUTROABS 11.3*  --   --   --   HGB 12.1   < > 11.1* 10.2*  HCT 36.9   < > 33.8* 31.9*  MCV 86.0   < > 85.8 87.2  PLT 252   < > 296 327   < > = values in this interval not displayed.   Basic Metabolic Panel:  Recent Labs  Lab 07/23/22 2244 07/23/22 2252 07/27/22 1505 07/28/22 0245  NA 137   < > 130* 132*  K 4.4   < > 3.8 3.7  CL 94*   < > 91* 92*  CO2 21*   < > 24 26  GLUCOSE 136*   < > 164* 83  BUN 64*   < > 34* 36*  CREATININE 13.99*   < > 7.63* 8.57*  CALCIUM 9.0   < > 8.0* 8.4*  MG 2.6*  --   --   --   PHOS  --    < > 5.0* 5.6*   < > = values in this interval not displayed.   Lipid Panel:  Recent Labs  Lab 07/25/22 0158  CHOL 198  TRIG 133  HDL 38*  CHOLHDL 5.2  VLDL 27  LDLCALC 133*   HgbA1c:  Recent Labs  Lab 07/25/22 0158  HGBA1C 5.4   Urine Drug Screen: No results for input(s): "LABOPIA", "COCAINSCRNUR", "LABBENZ", "AMPHETMU", "THCU", "LABBARB" in the last 168 hours.  Alcohol Level No results for input(s): "ETH" in the last 168 hours.  IMAGING past 24 hours CT HEAD WO  CONTRAST (5MM)  Result Date: 07/28/2022 CLINICAL DATA:  62 year old female code stroke presentation yesterday. Numerous scattered small embolic appearing infarcts on brain MRI. EXAM: CT HEAD WITHOUT CONTRAST TECHNIQUE: Contiguous axial images were obtained from the base of the skull through the vertex without intravenous contrast. RADIATION DOSE REDUCTION: This exam was performed according to the departmental dose-optimization program which includes automated exposure control, adjustment of the mA and/or kV according to patient size and/or use of iterative reconstruction technique. COMPARISON:  Head CT, CTA head and neck, cardiac CTA, and brain MRI yesterday. FINDINGS: Brain: Intravascular contrast persists. This limits evaluation for hyperdense intracranial hemorrhage. No midline shift or intracranial mass effect. No ventriculomegaly. No IVH is evident. Confluent bilateral cerebral white matter hypodensity redemonstrated. The scattered small gray and white matter infarcts demonstrated by MRI yesterday remain largely occult by CT. No superimposed major vascular territory infarct identified. Vascular: Abundant intravascular contrast persists,  query renal insufficiency if no repeat contrast dose since yesterday. The major intracranial vascular structures are enhancing as expected. Skull: No acute osseous abnormality identified. Sinuses/Orbits: Small sinus fluid levels, scattered bubbly opacity and mucosal thickening has not significantly changed. Tympanic cavities and mastoids remain well aerated. Other: No acute orbit or scalp soft tissue finding. IMPRESSION: 1. Abundant intravascular contrast persists, query Renal Insufficiency if no repeat iodinated contrast dose since yesterday. 2. Small embolic cerebral infarcts remain occult by CT. No intracranial mass effect. No definite acute intracranial hemorrhage. 3. Chronic white matter disease. 4. Paranasal sinus inflammation. Electronically Signed   By: Genevie Ann M.D.    On: 07/28/2022 09:53   MR BRAIN WO CONTRAST  Result Date: 07/28/2022 CLINICAL DATA:  Initial evaluation for neuro deficit, stroke suspected. EXAM: MRI HEAD WITHOUT CONTRAST TECHNIQUE: Multiplanar, multiecho pulse sequences of the brain and surrounding structures were obtained without intravenous contrast. COMPARISON:  Prior CTs from earlier the same day. FINDINGS: Brain: Cerebral volume within normal limits. Patchy and confluent T2/FLAIR hyperintensity involving the periventricular deep white matter both cerebral hemispheres as well as the pons, consistent with chronic small vessel ischemic disease, moderately advanced in nature. Small remote cortical subcortical infarct involving the right frontal lobe noted. Few small remote cerebellar infarcts noted as well. Multiple scattered foci of restricted diffusion are seen involving the bilateral cerebral hemispheres, consistent with acute ischemic infarcts. For reference purposes, the largest of these foci is seen at the subcortical left frontal lobe in measures 1 cm (series 7, image 58). Patchy involvement of the ventral pons noted as well (series 5, image 69). No associated hemorrhage or mass effect. A central thromboembolic etiology is suspected given the various vascular distributions involved. No mass lesion, midline shift or mass effect. No hydrocephalus or extra-axial fluid collection. Pituitary gland and suprasellar region within normal limits. Vascular: Major intracranial vascular flow voids are maintained. Skull and upper cervical spine: Craniocervical junction within normal limits. Bone marrow signal intensity normal. No scalp soft tissue abnormality. Sinuses/Orbits: Prior bilateral ocular lens replacement. Scattered mucosal thickening present throughout the paranasal sinuses. Small volume layering secretions present within the left sphenoid and maxillary sinuses. No mastoid effusion. Other: None. IMPRESSION: 1. Multiple scattered subcentimeter acute  ischemic infarcts involving the bilateral cerebral hemispheres and pons. No associated hemorrhage or mass effect. A central thromboembolic etiology is suspected given the various vascular distributions involved. 2. Underlying moderately advanced chronic microvascular ischemic disease with a few scattered remote infarcts involving the right frontal lobe and cerebellum. Electronically Signed   By: Jeannine Boga M.D.   On: 07/28/2022 00:37   DG Chest 1 View  Result Date: 07/27/2022 CLINICAL DATA:  Endocarditis EXAM: PORTABLE CHEST 1 VIEW COMPARISON:  07/23/2022 FINDINGS: Cardiac shadow is enlarged but stable. Aortic calcifications are seen. Previously seen patchy infiltrates have resolved in the interval. No new focal infiltrate is seen. No bony abnormality is noted. IMPRESSION: No active disease. Electronically Signed   By: Inez Catalina M.D.   On: 07/27/2022 21:36    PHYSICAL EXAM  Temp:  [97.4 F (36.3 C)-98.6 F (37 C)] 97.4 F (36.3 C) (03/24 1250) Pulse Rate:  [79-89] 81 (03/24 1250) Resp:  [11-26] 17 (03/24 1250) BP: (94-136)/(49-88) 105/58 (03/24 1250) SpO2:  [93 %-100 %] 100 % (03/24 1250) Weight:  [91.6 kg] 91.6 kg (03/24 0418)  General - Well nourished, well developed pleasant middle-aged African-American lady, in no apparent distress Cardiovascular - Regular rhythm and rate.  Mental Status -  Level of arousal  and orientation to time, place, and person were intact.  Responses are delayed Language including expression, naming, repetition, comprehension was assessed and found i slow Attention span and concentration were diminished Recent and remote memory were poor Fund of Knowledge was assessed and was diminished  Cranial Nerves II - XII - II - Visual field intact OU. III, IV, VI - Extraocular movements intact. V - Facial sensation intact bilaterally. VII - Facial movement intact bilaterally. VIII - Hearing & vestibular intact bilaterally. X - Palate elevates  symmetrically. XI - Chin turning & shoulder shrug intact bilaterally. XII - Tongue protrusion intact.  Motor Strength -generalized weakness of all extremities, with lower extremities more than upper extremities and right upper extremity being weaker than the left.  Left leg appears to be weaker than the right Motor Tone - Muscle tone was assessed at the neck and appendages and was normal. Sensory - Light touch, temperature/pinprick were assessed and were symmetrical.    Coordination - The patient had normal movements in the hands and feet with no ataxia or dysmetria.  Tremor was absent.  Gait and Station - deferred.  ASSESSMENT/PLAN Ms. JHOANA ECCLESTON is a 62 y.o. 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 She initially came to the hospital for NSTEMI and tricuspid vegetation. She was on a heparin gtt initially and is now just on subq heparin and ASA 81mg . 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 She has been receiving dialysis MWF with 3L UF.  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 ASPECTS 10.    CTA head & neck no LVO MRI multiple acute ischemic infarcts in bilateral cerebral hemispheres and pons Repeat CT head changed TEE EF 60 to 65%, moderate concentric left ventricular hypertrophy, left atria mildly dilated, mobile mass in right atrium, exuberant mitral annulus calcification, appears mobile.  Filamentous mobile echodensity on the aortic noncoronary cusp likely a Lambl's excrescence LDL 133 HgbA1c 5.4 VTE prophylaxis -subcu    Diet   Diet Heart Room service  appropriate? Yes; Fluid consistency: Thin   Aspirin 325 mg prior to admission, now on aspirin 81.  Therapy recommendations: Pending Disposition: Pending  Hypertension Home meds: Norvasc 10 mg Stable Permissive hypertension (OK if < 220/120) but gradually normalize in 5-7 days Long-term BP goal normotensive  Hyperlipidemia Home meds: Crestor 5 mg, switch to atorvastatin 40 mg LDL 133, goal < 70 Change Crestor 5 mg to atorvastatin 40 mg Continue statin at discharge  Diabetes type II Controlled Home meds: None HgbA1c 5.4, goal < 7.0 CBGs Recent Labs    07/26/22 0854 07/27/22 1052 07/28/22 0838  GLUCAP 78 82 88    SSI   Other Active Problems Stage renal disease on dialysis GERD Iron deficiency anemia  Hospital day # Tea, [facility]  [facility]  STROKE MD NOTE :  I have personally obtained history,examined this patient, reviewed notes, independently viewed imaging studies, participated in medical decision making and plan of care.ROS completed by me personally and pertinent positives fully documented  I have made any additions or clarifications directly to the above note. Agree with note above.  Dysarthria  aphasia and weakness yesterday and MRI scan shows multiple by cerebral as well as pontine embolic infarcts likely from her acute endocarditis as well as mitral annulus abscess and caseous necrosis and calcification.  Neurological prognosis is guarded recommend continue IV antibiotics for now.  She may not be the best candidate for open heart surgery at this time.  Continue aspirin and aggressive risk factor modification.  Physical occupational and speech therapy consults.  She will likely need rehab.  No family available at the bedside.  Greater than 50% time during this 50-minute visit was spent on counseling and coordination of care about her embolic strokes and discussion with patient and care team and answering questions.  Antony Contras,  MD Medical Director [facility] Pager: [phone] 07/28/2022 4:07 PM   To contact Stroke Continuity provider, please refer to [email or web address] After hours, contact General Neurology

## 2022-07-28 NOTE — Evaluation (Addendum)
Physical Therapy Evaluation Patient Details Name: [name] MRN: [id number] DOB: Sep 19, 1960 Today's Date: 07/28/2022  History of Present Illness  62 y.o. female presents to [facility] hospital on 07/23/2022 with chest pain after being found down at home, found to have NSTEMI and PNA. Code stroke called 3/23 and MRI revealed multiple scattered subcentimeter acute ischemic infarcts  involving the bilateral cerebral hemispheres and pons.   PMH includes DM, ESRD, GERD, HLD, HTN.  Clinical Impression  Pt seen for Re-Evaluation secondary to MRI revealing  multiple scattered infarcts, new imaging with no hemorrhage/mass effect. Pt with increased deficits since original Evaluation effecting R UE and L LE, and decrease in cognition. Pt requires maxAx2 for bed mobility, and total A to come to and maintain standing, unable to take steps. Pt lives with son who works during the day however multiple supportive family members in room during session, who could provide 24 hour assist at discharge. Pt with increased desire to return to PLOF and would make good candidate for AIR level rehab. PT will continue to follow acutely.      Recommendations for follow up therapy are one component of a multi-disciplinary discharge planning process, led by the attending physician.  Recommendations may be updated based on patient status, additional functional criteria and insurance authorization.  Follow Up Recommendations Acute inpatient rehab (3hours/day) Can patient physically be transported by private vehicle: No    Assistance Recommended at Discharge Frequent or constant Supervision/Assistance  Patient can return home with the following  Two people to help with walking and/or transfers;Two people to help with bathing/dressing/bathroom;Assistance with cooking/housework;Assistance with feeding;Direct supervision/assist for medications management;Direct supervision/assist for financial management;Assist for transportation;Help with  stairs or ramp for entrance    Equipment Recommendations Wheelchair (measurements PT);Wheelchair cushion (measurements PT);BSC/3in1  Recommendations for Other Services  Rehab consult    Functional Status Assessment Patient has had a recent decline in their functional status and demonstrates the ability to make significant improvements in function in a reasonable and predictable amount of time.     Precautions / Restrictions Precautions Precautions: Fall Restrictions Weight Bearing Restrictions: No      Mobility  Bed Mobility Overal bed mobility: Needs Assistance Bed Mobility: Supine to Sit     Supine to sit: Mod assist, +2 for physical assistance Sit to supine: Max assist, +2 for physical assistance   General bed mobility comments: use of bed pad to scoot hips to EOB    Transfers Overall transfer level: Needs assistance Equipment used: 2 person hand held assist Transfers: Sit to/from Stand, Bed to chair/wheelchair/BSC Sit to Stand: Max assist, +2 physical assistance, Total assist           General transfer comment: unable to take/steps/weight shift in standing       Modified Rankin (Stroke Patients Only) Modified Rankin (Stroke Patients Only) Pre-Morbid Rankin Score: No significant disability Modified Rankin: Severe disability     Balance Overall balance assessment: Needs assistance Sitting-balance support: Feet supported Sitting balance-Leahy Scale: Poor Sitting balance - Comments: posterior and R lateral lean Postural control: Posterior lean, Right lateral lean Standing balance support: During functional activity, Reliant on assistive device for balance Standing balance-Leahy Scale: Zero                               Pertinent Vitals/Pain Pain Assessment Pain Assessment: No/denies pain    Home Living Family/patient expects to be discharged to:: Private residence Living Arrangements: Children Available  Help at Discharge:  Family;Available PRN/intermittently Type of Home: Apartment Home Access: Stairs to enter Entrance Stairs-Rails: Right Entrance Stairs-Number of Steps: flight   Home Layout: One level Home Equipment: None      Prior Function Prior Level of Function : Independent/Modified Independent             Mobility Comments: ambulatory without DME, 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       Hand Dominance   Dominant Hand: Right    Extremity/Trunk Assessment   Upper Extremity Assessment Upper Extremity Assessment: Defer to OT evaluation LUE Deficits / Details: proximal vs distal weakness, ataxic-like movement with shoulder flexion, can hold arm up against gravity, can flex/ext elbow , grasp 3/5. manually facilitates movement of LUE with RUE unless cued LUE Coordination: decreased gross motor;decreased fine motor    Lower Extremity Assessment Lower Extremity Assessment: RLE deficits/detail;LLE deficits/detail RLE Deficits / Details: weakness >proximal than distal, increased activation time, utilizes UE to facilitate movement of LE RLE Sensation: WNL RLE Coordination: decreased fine motor;decreased gross motor LLE Deficits / Details: 2/3 hip strength, knee and ankle 3+/5    Cervical / Trunk Assessment Cervical / Trunk Assessment: Other exceptions (pt with R lateral lean)  Communication   Communication: No difficulties  Cognition Arousal/Alertness: Awake/alert Behavior During Therapy: Flat affect Overall Cognitive Status: Impaired/Different from baseline Area of Impairment: Safety/judgement, Following commands, Attention, Problem solving                   Current Attention Level: Selective   Following Commands: Follows one step commands with increased time Safety/Judgement: Decreased awareness of deficits, Decreased awareness of safety Awareness: Intellectual Problem Solving: Slow processing, Decreased initiation, Difficulty sequencing, Requires verbal cues, Requires tactile  cues General Comments: increased time to state place, states she is at [facility], and it is June, when given hint of month, pt then states it is May. Able to recognize state sister's name upon arrival when sitting EOB, increased time needed to recall sister's name once returned to supine        General Comments General comments (skin integrity, edema, etc.): VSS on RA, family present        Assessment/Plan    PT Assessment Patient needs continued PT services  PT Problem List Decreased strength;Decreased activity tolerance;Decreased balance;Decreased mobility;Cardiopulmonary status limiting activity       PT Treatment Interventions DME instruction;Gait training;Functional mobility training;Therapeutic activities;Therapeutic exercise;Balance training;Cognitive remediation;Patient/family education    PT Goals (Current goals can be found in the Care Plan section)  Acute Rehab PT Goals PT Goal Formulation: With patient/family Time For Goal Achievement: 08/07/22 Potential to Achieve Goals: Fair    Frequency Min 1X/week     Co-evaluation PT/OT/SLP Co-Evaluation/Treatment: Yes Reason for Co-Treatment: Complexity of the patient's impairments (multi-system involvement);For patient/therapist safety;To address functional/ADL transfers PT goals addressed during session: Mobility/safety with mobility OT goals addressed during session: ADL's and self-care;Strengthening/ROM       AM-PAC PT "6 Clicks" Mobility  Outcome Measure Help needed turning from your back to your side while in a flat bed without using bedrails?: Total Help needed moving from lying on your back to sitting on the side of a flat bed without using bedrails?: Total Help needed moving to and from a bed to a chair (including a wheelchair)?: Total Help needed standing up from a chair using your arms (e.g., wheelchair or bedside chair)?: Total Help needed to walk in hospital room?: Total Help needed climbing 3-5 steps with  a railing? : Total 6 Click Score: 6    End of Session Equipment Utilized During Treatment: Gait belt Activity Tolerance: Patient limited by fatigue Patient left: in bed;with call bell/phone within reach;with bed alarm set Nurse Communication: Mobility status PT Visit Diagnosis: Other abnormalities of gait and mobility (R26.89);Muscle weakness (generalized) (M62.81)    Time: [id number] PT Time Calculation (min) (ACUTE ONLY): 27 min   Charges:   PT Evaluation $PT Re-evaluation: 1 Re-eval          Rozell Theiler B. Migdalia Dk PT, DPT Acute Rehabilitation Services Please use secure chat or  Call Office [phone]   [place] 07/28/2022, 1:36 PM

## 2022-07-29 ENCOUNTER — Inpatient Hospital Stay (HOSPITAL_COMMUNITY): Payer: 59

## 2022-07-29 ENCOUNTER — Other Ambulatory Visit (HOSPITAL_COMMUNITY): Payer: 59

## 2022-07-29 DIAGNOSIS — I330 Acute and subacute infective endocarditis: Secondary | ICD-10-CM | POA: Diagnosis not present

## 2022-07-29 LAB — RENAL FUNCTION PANEL
Albumin: 2.9 g/dL — ABNORMAL LOW (ref 3.5–5.0)
Anion gap: 15 (ref 5–15)
BUN: 45 mg/dL — ABNORMAL HIGH (ref 8–23)
CO2: 24 mmol/L (ref 22–32)
Calcium: 8.8 mg/dL — ABNORMAL LOW (ref 8.9–10.3)
Chloride: 90 mmol/L — ABNORMAL LOW (ref 98–111)
Creatinine, Ser: 10.28 mg/dL — ABNORMAL HIGH (ref 0.44–1.00)
GFR, Estimated: 4 mL/min — ABNORMAL LOW (ref >60)
Glucose, Bld: 89 mg/dL (ref 70–99)
Phosphorus: 6.9 mg/dL — ABNORMAL HIGH (ref 2.5–4.6)
Potassium: 3.7 mmol/L (ref 3.5–5.1)
Sodium: 129 mmol/L — ABNORMAL LOW (ref 135–145)

## 2022-07-29 LAB — CBC
HCT: 31.2 % — ABNORMAL LOW (ref 36.0–46.0)
Hemoglobin: 10.4 g/dL — ABNORMAL LOW (ref 12.0–15.0)
MCH: 28.6 pg (ref 26.0–34.0)
MCHC: 33.3 g/dL (ref 30.0–36.0)
MCV: 85.7 fL (ref 80.0–100.0)
Platelets: 379 10*3/uL (ref 150–400)
RBC: 3.64 MIL/uL — ABNORMAL LOW (ref 3.87–5.11)
RDW: 15 % (ref 11.5–15.5)
WBC: 9.6 10*3/uL (ref 4.0–10.5)
nRBC: 0 % (ref 0.0–0.2)

## 2022-07-29 LAB — TROPONIN I (HIGH SENSITIVITY)
Troponin I (High Sensitivity): 292 ng/L (ref <18)
Troponin I (High Sensitivity): 252 ng/L (ref <18)

## 2022-07-29 MED ORDER — PENTAFLUOROPROP-TETRAFLUOROETH EX AERO: 1.0000 | INHALATION_SPRAY | CUTANEOUS | Status: DC | PRN

## 2022-07-29 MED ORDER — ALTEPLASE 2 MG IJ SOLR: 2.0000 mg | Freq: Once | INTRAMUSCULAR | Status: DC | PRN

## 2022-07-29 MED ORDER — LIDOCAINE-PRILOCAINE 2.5-2.5 % EX CREA: 1.0000 | TOPICAL_CREAM | CUTANEOUS | Status: DC | PRN

## 2022-07-29 MED ORDER — HEPARIN SODIUM (PORCINE) 1000 UNIT/ML IJ SOLN
INTRAMUSCULAR | Status: AC
  Filled 2022-07-29: qty 6

## 2022-07-29 MED ORDER — LIDOCAINE HCL (PF) 1 % IJ SOLN: 5.0000 mL | INTRAMUSCULAR | Status: DC | PRN

## 2022-07-29 MED ORDER — HEPARIN SODIUM (PORCINE) 1000 UNIT/ML DIALYSIS
1000.0000 [IU] | INTRAMUSCULAR | Status: DC | PRN
  Administered 2022-07-29: 2000 [IU]
  Filled 2022-07-29: qty 1

## 2022-07-29 MED ORDER — ANTICOAGULANT SODIUM CITRATE 4% (200MG/5ML) IV SOLN: 5.0000 mL | Status: DC | PRN

## 2022-07-29 NOTE — Progress Notes (Signed)
Attempted MRI of sternum, patient unable to hold still for exam. Excessive patient motion artifact from confusion, breathing, and coughing.

## 2022-07-29 NOTE — TOC Initial Note (Signed)
Transition of Care [facility]) - Initial/Assessment Note    Patient Details  Name: [name] MRN: [id number] Date of Birth: Jan 28, 1961  Transition of [facility]) CM/SW Contact:    Carolyn Roys, RN Phone Number: 07/29/2022, 4:53 PM  Clinical Narrative:  Risk for readmission assessment completed. Patient presented for chest pain. HX ESRD-MWF HD session. ID following for acute bacterial endocarditis-continues on vancomycin IV. Case Manager reached out to daughter regarding transition of care needs. Daughter states prior to arrival patient was independent from home with son [name].  Case Manager made the daughter aware that PT recommendations are for inpatient rehab-and if the patient cannot tolerate CIR, daughter is aware that we may need to look into SNF. Case Manager will continue to follow for transition of care needs as the patient progresses.                  Expected Discharge Plan: IP Rehab Facility Barriers to Discharge: Continued Medical Work up   Patient Goals and CMS Choice Patient states their goals for this hospitalization and ongoing recovery are:: daughter would like inpatient rehab- aware that we may need to look at backup for snf.   Expected Discharge Plan and Services In-house Referral: Clinical Social Work Discharge Planning Services: CM Consult Post Acute Care Choice: IP Rehab Living arrangements for the past 2 months: Apartment                   DME Agency: NA  Prior Living Arrangements/Services Living arrangements for the past 2 months: Apartment Lives with:: Adult Children (lives with son [name].) Patient language and need for interpreter reviewed:: Yes        Need for Family Participation in Patient Care: Yes (Comment) Care giver support system in place?: Yes (comment)   Criminal Activity/Legal Involvement Pertinent to Current Situation/Hospitalization: No - Comment as needed  Activities of Daily Living Home Assistive Devices/Equipment: None ADL  Screening (condition at time of admission) Patient's cognitive ability adequate to safely complete daily activities?: Yes Is the patient deaf or have difficulty hearing?: No Does the patient have difficulty seeing, even when wearing glasses/contacts?: No Does the patient have difficulty concentrating, remembering, or making decisions?: No Patient able to express need for assistance with ADLs?: Yes Does the patient have difficulty dressing or bathing?: No Independently performs ADLs?: Yes (appropriate for developmental age) Does the patient have difficulty walking or climbing stairs?: No Weakness of Legs: None Weakness of Arms/Hands: None  Permission Sought/Granted Permission sought to share information with : Family Supports, Case Manager     Emotional Assessment Appearance:: Appears stated age       Alcohol / Substance Use: Not Applicable Psych Involvement: No (comment)  Admission diagnosis:  NSTEMI (non-ST elevated myocardial infarction) ([place]) [I21.4] Multifocal pneumonia [J18.9] Patient Active Problem List   Diagnosis Date Noted   Localized swelling of chest wall 07/28/2022   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 07/28/2022   Acute bacterial endocarditis 07/26/2022   Multifocal pneumonia 07/24/2022   Stroke due to embolism ([place]) 07/24/2022   ESRD on dialysis [facility])    History of stroke 09/18/2019   Type 2 diabetes mellitus with diabetic chronic kidney disease ([place]) 02/19/2019   Anemia in chronic kidney disease 02/19/2019   Secondary hyperparathyroidism of renal origin ([place]) 02/19/2019   Venous (peripheral) insufficiency 08/06/2017   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 [place] ([place]) [place] [address] Phone: [phone] Fax: [phone]     Social Determinants of Health (SDOH) Social History: [place]: No Food Insecurity (07/24/2022)  Housing: Low Risk  (07/24/2022)  Transportation Needs: No Transportation Needs (07/24/2022)  Utilities: Not At Risk (07/24/2022)  Alcohol Screen: Low Risk  (05/31/2021)  Depression (PHQ2-9): Low Risk  (05/02/2022)  Financial Resource Strain: Low Risk  (05/31/2021)  Physical Activity: Sufficiently Active (05/31/2021)  Social Connections: Moderately Integrated (05/31/2021)  Stress: No Stress Concern Present (05/31/2021)  Tobacco Use: Medium Risk (07/28/2022)   Readmission Risk Interventions    07/29/2022    4:53 PM  Readmission Risk Prevention Plan  Transportation Screening Complete  HRI or Home Care Consult Complete  Social Work Consult for [place] Planning/Counseling Complete  Palliative Care Screening Not Applicable  Medication Review Press photographer) Referral to Pharmacy

## 2022-07-29 NOTE — Progress Notes (Signed)
Per Dr. Jeani Hawking, patient does not need to be on tele for MRI

## 2022-07-29 NOTE — Progress Notes (Signed)
PT Cancellation Note  Patient Details Name: [name] MRN: [id number] DOB: [birth year]   Cancelled Treatment:    Reason Eval/Treat Not Completed: Patient declined, no reason specified.  Pt was in HD and now declining to move with PT.  Retry as time and pt allow.   Ramond Dial 07/29/2022, 3:26 PM  Mee Hives, PT PhD Acute Rehab Dept. Number: [place]

## 2022-07-29 NOTE — Procedures (Signed)
I was present at this dialysis session. I have reviewed the session itself and made appropriate changes.   Filed Weights   07/28/22 0418 07/29/22 0420 07/29/22 0757  Weight: 91.6 kg 93.9 kg 93.8 kg    Recent Labs  Lab 07/29/22 0027  NA 129*  K 3.7  CL 90*  CO2 24  GLUCOSE 89  BUN 45*  CREATININE 10.28*  CALCIUM 8.8*  PHOS 6.9*    Recent Labs  Lab 07/23/22 2244 07/23/22 2252 07/27/22 0126 07/28/22 0245 07/29/22 0027  WBC 12.6*   < > 8.1 7.6 9.6  NEUTROABS 11.3*  --   --   --   --   HGB 12.1   < > 11.1* 10.2* 10.4*  HCT 36.9   < > 33.8* 31.9* 31.2*  MCV 86.0   < > 85.8 87.2 85.7  PLT 252   < > 296 327 379   < > = values in this interval not displayed.    Scheduled Meds:  aspirin EC  81 mg Oral Daily   atorvastatin  40 mg Oral Daily   cinacalcet  90 mg Oral Q M,W,F-HD   darbepoetin (ARANESP) injection - DIALYSIS  25 mcg Subcutaneous Q Mon-1800   feeding supplement (NEPRO CARB STEADY)  237 mL Oral BID BM   heparin injection (subcutaneous)  5,000 Units Subcutaneous Q8H   heparin sodium (porcine)       isosorbide-hydrALAZINE  1 tablet Oral TID   lidocaine  1 patch Transdermal Q24H   metoprolol tartrate  12.5 mg Oral BID   pantoprazole  40 mg Oral Daily   polyethylene glycol  17 g Oral Daily   sucroferric oxyhydroxide  500 mg Oral TID WC   Continuous Infusions:  anticoagulant sodium citrate     vancomycin 1,000 mg (07/26/22 1743)   PRN Meds:.acetaminophen, alteplase, anticoagulant sodium citrate, calcium carbonate, capsaicin, heparin, heparin sodium (porcine), lidocaine (PF), lidocaine, lidocaine-prilocaine, nitroGLYCERIN, pentafluoroprop-tetrafluoroeth   Santiago Bumpers,  MD 07/29/2022, 9:48 AM

## 2022-07-29 NOTE — Progress Notes (Signed)
Rounding Note    Patient Name: [name] Date of Encounter: 07/29/2022  [facility] Cardiologist: Dr. Casandra Doffing  Subjective   Patient more communicative today.  She denies chest pain or shortness of breath.  Her abdominal pain seems to have resolved as well.  Inpatient Medications    Scheduled Meds:  aspirin EC  81 mg Oral Daily   atorvastatin  40 mg Oral Daily   cinacalcet  90 mg Oral Q M,W,F-HD   darbepoetin (ARANESP) injection - DIALYSIS  25 mcg Subcutaneous Q Mon-1800   feeding supplement (NEPRO CARB STEADY)  237 mL Oral BID BM   heparin injection (subcutaneous)  5,000 Units Subcutaneous Q8H   heparin sodium (porcine)       isosorbide-hydrALAZINE  1 tablet Oral TID   lidocaine  1 patch Transdermal Q24H   metoprolol tartrate  12.5 mg Oral BID   pantoprazole  40 mg Oral Daily   polyethylene glycol  17 g Oral Daily   sucroferric oxyhydroxide  500 mg Oral TID WC   Continuous Infusions:  anticoagulant sodium citrate     vancomycin 1,000 mg (07/26/22 1743)   PRN Meds: acetaminophen, alteplase, anticoagulant sodium citrate, calcium carbonate, capsaicin, heparin, heparin sodium (porcine), lidocaine (PF), lidocaine, lidocaine-prilocaine, nitroGLYCERIN, pentafluoroprop-tetrafluoroeth   Vital Signs    Vitals:   07/29/22 0421 07/29/22 0757 07/29/22 0817 07/29/22 0830  BP: (!) 124/50 (!) 164/55 (!) 149/69 (!) 140/73  Pulse: 76 83 82 80  Resp: 15 (!) 27 (!) 24 (!) 24  Temp: 97.9 F (36.6 C) (!) 97.3 F (36.3 C)    TempSrc: Oral Oral    SpO2: 100% 100% 100% 100%  Weight:  93.8 kg    Height:        Intake/Output Summary (Last 24 hours) at 07/29/2022 0859 Last data filed at 07/29/2022 [id number] Gross per 24 hour  Intake --  Output 1 ml  Net -1 ml      07/29/2022    7:57 AM 07/29/2022    4:20 AM 07/28/2022    4:18 AM  Last 3 Weights  Weight (lbs) 206 lb 12.7 oz 207 lb 0.2 oz 201 lb 15.1 oz  Weight (kg) 93.8 kg 93.9 kg 91.6 kg      Telemetry    Not on  telemetry currently- Personally Reviewed  ECG    Not performed today- Personally Reviewed  Physical Exam   GEN: No acute distress.   Neck: No JVD Cardiac: RRR, no murmurs, rubs, or gallops.  Respiratory: Clear to auscultation bilaterally. GI: Soft, nontender, non-distended  MS: No edema; No deformity. Neuro:  Nonfocal  Psych: Normal affect   Labs    High Sensitivity Troponin:   Recent Labs  Lab 07/23/22 2244 07/24/22 0120 07/24/22 0733 07/24/22 1221 07/24/22 1502  TROPONINIHS 2,155* 1,948* 6,242* 4,206* 4,276*     Chemistry Recent Labs  Lab 07/23/22 2244 07/23/22 2252 07/24/22 0120 07/25/22 0158 07/26/22 0244 07/27/22 1505 07/28/22 0245 07/29/22 0027  NA 137   < > 139 136   < > 130* 132* 129*  K 4.4   < > 4.9 4.0   < > 3.8 3.7 3.7  CL 94*   < > 96* 96*   < > 91* 92* 90*  CO2 21*  --  23 23   < > 24 26 24   GLUCOSE 136*   < > 134* 70   < > 164* 83 89  BUN 64*   < > 64* 38*   < >  34* 36* 45*  CREATININE 13.99*   < > 14.02* 8.52*   < > 7.63* 8.57* 10.28*  CALCIUM 9.0  --  8.8* 8.4*   < > 8.0* 8.4* 8.8*  MG 2.6*  --   --   --   --   --   --   --   PROT 7.5  --  7.0 6.6  --   --   --   --   ALBUMIN 3.2*  --  3.0* 2.7*  --  2.8* 2.8* 2.9*  AST 44*  --  48* 47*  --   --   --   --   ALT 19  --  17 20  --   --   --   --   ALKPHOS 57  --  51 49  --   --   --   --   BILITOT 0.8  --  0.6 0.9  --   --   --   --   GFRNONAA 3*  --  3* 5*   < > 6* 5* 4*  ANIONGAP 22*  --  20* 17*   < > 15 14 15    < > = values in this interval not displayed.    Lipids  Recent Labs  Lab 07/25/22 0158  CHOL 198  TRIG 133  HDL 38*  LDLCALC 133*  CHOLHDL 5.2    Hematology Recent Labs  Lab 07/27/22 0126 07/28/22 0245 07/29/22 0027  WBC 8.1 7.6 9.6  RBC 3.94 3.66* 3.64*  HGB 11.1* 10.2* 10.4*  HCT 33.8* 31.9* 31.2*  MCV 85.8 87.2 85.7  MCH 28.2 27.9 28.6  MCHC 32.8 32.0 33.3  RDW 14.6 14.8 15.0  PLT 296 327 379   Thyroid No results for input(s): "TSH", "FREET4" in the last  168 hours.  BNPNo results for input(s): "BNP", "PROBNP" in the last 168 hours.  DDimer No results for input(s): "DDIMER" in the last 168 hours.   Radiology    CT HEAD WO CONTRAST (5MM)  Result Date: 07/28/2022 CLINICAL DATA:  62 year old female code stroke presentation yesterday. Numerous scattered small embolic appearing infarcts on brain MRI. EXAM: CT HEAD WITHOUT CONTRAST TECHNIQUE: Contiguous axial images were obtained from the base of the skull through the vertex without intravenous contrast. RADIATION DOSE REDUCTION: This exam was performed according to the departmental dose-optimization program which includes automated exposure control, adjustment of the mA and/or kV according to patient size and/or use of iterative reconstruction technique. COMPARISON:  Head CT, CTA head and neck, cardiac CTA, and brain MRI yesterday. FINDINGS: Brain: Intravascular contrast persists. This limits evaluation for hyperdense intracranial hemorrhage. No midline shift or intracranial mass effect. No ventriculomegaly. No IVH is evident. Confluent bilateral cerebral white matter hypodensity redemonstrated. The scattered small gray and white matter infarcts demonstrated by MRI yesterday remain largely occult by CT. No superimposed major vascular territory infarct identified. Vascular: Abundant intravascular contrast persists, query renal insufficiency if no repeat contrast dose since yesterday. The major intracranial vascular structures are enhancing as expected. Skull: No acute osseous abnormality identified. Sinuses/Orbits: Small sinus fluid levels, scattered bubbly opacity and mucosal thickening has not significantly changed. Tympanic cavities and mastoids remain well aerated. Other: No acute orbit or scalp soft tissue finding. IMPRESSION: 1. Abundant intravascular contrast persists, query Renal Insufficiency if no repeat iodinated contrast dose since yesterday. 2. Small embolic cerebral infarcts remain occult by CT. No  intracranial mass effect. No definite acute intracranial hemorrhage. 3. Chronic white matter disease. 4.  Paranasal sinus inflammation. Electronically Signed   By: Genevie Ann M.D.   On: 07/28/2022 09:53   MR BRAIN WO CONTRAST  Result Date: 07/28/2022 CLINICAL DATA:  Initial evaluation for neuro deficit, stroke suspected. EXAM: MRI HEAD WITHOUT CONTRAST TECHNIQUE: Multiplanar, multiecho pulse sequences of the brain and surrounding structures were obtained without intravenous contrast. COMPARISON:  Prior CTs from earlier the same day. FINDINGS: Brain: Cerebral volume within normal limits. Patchy and confluent T2/FLAIR hyperintensity involving the periventricular deep white matter both cerebral hemispheres as well as the pons, consistent with chronic small vessel ischemic disease, moderately advanced in nature. Small remote cortical subcortical infarct involving the right frontal lobe noted. Few small remote cerebellar infarcts noted as well. Multiple scattered foci of restricted diffusion are seen involving the bilateral cerebral hemispheres, consistent with acute ischemic infarcts. For reference purposes, the largest of these foci is seen at the subcortical left frontal lobe in measures 1 cm (series 7, image 58). Patchy involvement of the ventral pons noted as well (series 5, image 69). No associated hemorrhage or mass effect. A central thromboembolic etiology is suspected given the various vascular distributions involved. No mass lesion, midline shift or mass effect. No hydrocephalus or extra-axial fluid collection. Pituitary gland and suprasellar region within normal limits. Vascular: Major intracranial vascular flow voids are maintained. Skull and upper cervical spine: Craniocervical junction within normal limits. Bone marrow signal intensity normal. No scalp soft tissue abnormality. Sinuses/Orbits: Prior bilateral ocular lens replacement. Scattered mucosal thickening present throughout the paranasal sinuses.  Small volume layering secretions present within the left sphenoid and maxillary sinuses. No mastoid effusion. Other: None. IMPRESSION: 1. Multiple scattered subcentimeter acute ischemic infarcts involving the bilateral cerebral hemispheres and pons. No associated hemorrhage or mass effect. A central thromboembolic etiology is suspected given the various vascular distributions involved. 2. Underlying moderately advanced chronic microvascular ischemic disease with a few scattered remote infarcts involving the right frontal lobe and cerebellum. Electronically Signed   By: Jeannine Boga M.D.   On: 07/28/2022 00:37   DG Chest 1 View  Result Date: 07/27/2022 CLINICAL DATA:  Endocarditis EXAM: PORTABLE CHEST 1 VIEW COMPARISON:  07/23/2022 FINDINGS: Cardiac shadow is enlarged but stable. Aortic calcifications are seen. Previously seen patchy infiltrates have resolved in the interval. No new focal infiltrate is seen. No bony abnormality is noted. IMPRESSION: No active disease. Electronically Signed   By: Inez Catalina M.D.   On: 07/27/2022 21:36   CT CORONARY MORPH W/CTA COR W/SCORE W/CA W/CM &/OR WO/CM  Addendum Date: 07/27/2022   ADDENDUM REPORT: 07/27/2022 12:11 CLINICAL DATA:  This over-read does not include interpretation of cardiac or coronary anatomy or pathology. The coronary CTA interpretation by the cardiologist is attached. COMPARISON:  None available. FINDINGS: Patchy areas of ground-glass opacity are seen throughout the visualized portions of both lungs, which nonspecific and could be due to edema, atypical infection, or acute lung injury. No evidence of pulmonary consolidation no suspicious nodules or masses are identified in the visualized portion of the lungs. No pleural fluid seen. The visualized portions of the mediastinum and hilar regions are unremarkable. IMPRESSION: Heterogeneous ground-glass opacity throughout visualized portions of both lungs. This finding is nonspecific and could be due  to edema, atypical infection, or acute lung injury. Electronically Signed   By: Marlaine Hind M.D.   On: 07/27/2022 12:11   Result Date: 07/27/2022 CLINICAL DATA:  Question SBE with Vegetations Abnormal Mitral Valve EXAM: Cardiac CT TECHNIQUE: The patient was scanned on a [employer] 192  slice scanner. A 120 kV retrospective scan was triggered in the ascending thoracic aorta at 140 HU's. Gantry rotation speed was 250 msecs and collimation was .6 mm. No beta blockade or nitro were given. The 3D data set was reconstructed in 5% intervals of the R-R cycle. Systolic and diastolic phases were analyzed on a dedicated work station using MPR, MIP and VRT modes. The patient received 80 cc of contrast. FINDINGS: Mild bi atrial enlargement No ASD/VSD/PFO. No pericardial effusion Normal ascending thoracic aorta 2.9 cm Moderate calcific atherosclerosis with normal arch vessels AV: Tri leaflet No vegetation noted There is a small area of apparent contrast enhancement from non coronary sinus to the RA Cannot r/o fistula but correlates with mobile mass seen on TEE measuring 7.7 mm x 4.1 mm Non coronary sinus: 2.85 cm Right coronary sinus: 2.84 cm Left coronary sinus: 2.74 cm MV: Markedly abnormal posterior annulus. Mass on ventricular side of leaflet measuring 1.9 cm x 1.5 cm partially calcified but mixed tissue density with HU's 150 to 250 Apears to be some contrast enhancement The posterior leaflet has restricted motion with entire leaflet being thickened 1.2 cm x 2.9 cm The overall appearance appears more to be and annular abscess rather than caseating mitral annular calcification The mass extends posteriorly toward the coronary sinus The non contrast images show calcified annular edges through out the mitral annulus with edge HU's as high as 650 The core area inside the MAC measures only 60-90 HU's TV: Not well seen with contrast wash out in RV PV: Not well seen with contrast wash out in RVOT Coronary Arteries: 1-24%  calcified plaque in proximal and mid vessel 25-49% calcified plaque in PDA/PLB LM is normal LAD with 25-40% calcified plaque in proximal and mid vessel Normal diagonal and IM branches LCX 1024% soft plaque proximally and 1024% calcified plaque distally . IMPRESSION: 1. CAD RADS 2 non obstructive CAD see description above study done without nitro or beta blocker 2. 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 Contrast enhancement and large central area of mixed HU's cannot really tell if severe caseating MAC vs annular abscess but latter may be more likely FDG PET CT would likely be best distinguishing test between degenerative disease vs infection/inflammation 3. Contrast enhancing small mass at base of septal TV leaflet near non coronary aortic annulus Jenkins Rouge Electronically Signed: By: Jenkins Rouge M.D. On: 07/27/2022 11:39   CT ANGIO HEAD NECK W WO CM (CODE STROKE)  Result Date: 07/27/2022 CLINICAL DATA:  Aphasia and left-sided weakness EXAM: CT ANGIOGRAPHY HEAD AND NECK TECHNIQUE: Multidetector CT imaging of the head and neck was performed using the standard protocol during bolus administration of intravenous contrast. Multiplanar CT image reconstructions and MIPs were obtained to evaluate the vascular anatomy. Carotid stenosis measurements (when applicable) are obtained utilizing NASCET criteria, using the distal internal carotid diameter as the denominator. RADIATION DOSE REDUCTION: This exam was performed according to the departmental dose-optimization program which includes automated exposure control, adjustment of the mA and/or kV according to patient size and/or use of iterative reconstruction technique. CONTRAST:  44mL OMNIPAQUE IOHEXOL 350 MG/ML SOLN COMPARISON:  09/18/2019 CTA head and neck, correlation is also made with CT head 07/27/2022 FINDINGS: CT HEAD FINDINGS For noncontrast findings, please see same day CT head. CTA NECK FINDINGS Aortic arch: Standard branching.  Imaged portion shows no evidence of aneurysm or dissection. Aortic atherosclerosis. Approximately 80% stenosis at the origin of the left ICA (series 9, image 127 and series 7,  image 326). Right carotid system: No evidence of dissection, occlusion, or hemodynamically significant stenosis (greater than 50%). Atherosclerotic disease at the bifurcation and in the proximal ICA is not hemodynamically significant. Left carotid system: No evidence of dissection, occlusion, or hemodynamically significant stenosis (greater than 50%). Atherosclerotic disease at the bifurcation and in the proximal ICA is not hemodynamically significant. Vertebral arteries: Mild stenosis at the origin of the left vertebral artery, with additional moderate stenosis in the left V2 segment (series 7, image 2917). The right vertebral artery is patent from its origin to the skull base without significant stenosis. No evidence of dissection. Skeleton: No acute osseous abnormality. Degenerative changes in the cervical spine. Other neck: Negative. Upper chest: Ground-glass opacities in the right lung. No pleural effusion. Review of the MIP images confirms the above findings CTA HEAD FINDINGS Anterior circulation: Both internal carotid arteries are patent to the termini, with moderate stenosis in the right cavernous and left supraclinoid ICA. A1 segments patent. Normal anterior communicating artery. Anterior cerebral arteries are patent to their distal aspects. No M1 stenosis or occlusion. MCA branches perfused and symmetric. Posterior circulation: Vertebral arteries patent to the vertebrobasilar junction without stenosis. Basilar patent to its distal aspect. Superior cerebellar arteries patent proximally. Patent P1 segments, hypoplastic on the right. Near fetal origin of the right PCA from the right posterior communicating artery. The left posterior communicating artery is also patent. PCAs are somewhat irregular perfused to their distal aspects without  focal stenosis. Venous sinuses: As permitted by contrast timing, patent. Anatomic variants: None significant. Review of the MIP images confirms the above findings IMPRESSION: 1. No intracranial large vessel occlusion. 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 2. Approximately 80% stenosis at the origin of the left ICA. Mild stenosis at the left vertebral artery origin and moderate stenosis in the left V2. No other hemodynamically significant stenosis in the neck. 3. Ground-glass opacities in the imaged right lung, which could be infectious or inflammatory. Consider CT chest for further evaluation. 4. Aortic atherosclerosis. Aortic Atherosclerosis (ICD10-I70.0). Imaging results were communicated on 07/27/2022 at 11:43 am to provider Dr. Rory Percy via secure text paging. Electronically Signed   By: Merilyn Baba M.D.   On: 07/27/2022 11:43   CT HEAD CODE STROKE WO CONTRAST  Result Date: 07/27/2022 CLINICAL DATA:  Code stroke.  Aphasia and left-sided weakness EXAM: CT HEAD WITHOUT CONTRAST TECHNIQUE: Contiguous axial images were obtained from the base of the skull through the vertex without intravenous contrast. RADIATION DOSE REDUCTION: This exam was performed according to the departmental dose-optimization program which includes automated exposure control, adjustment of the mA and/or kV according to patient size and/or use of iterative reconstruction technique. COMPARISON:  07/23/2022 CT head FINDINGS: Brain: No evidence of acute infarction, hemorrhage, mass, mass effect, or midline shift. No hydrocephalus or extra-axial collection. Vascular: Contrast is noted with vasculature from same day CT heart, which limits evaluation for hyperdense vessel. Skull: Negative for fracture or focal lesion. Sinuses/Orbits: Mucosal thickening in the ethmoid air cells and left sphenoid sinus, with air-fluid levels in the maxillary sinuses. Status post bilateral lens replacements. Other: The mastoid air cells  are well aerated. ASPECTS [facility] Stroke Program Early CT Score) - Ganglionic level infarction (caudate, lentiform nuclei, internal capsule, insula, M1-M3 cortex): 7 - Supraganglionic infarction (M4-M6 cortex): 3 Total score (0-10 with 10 being normal): 10 IMPRESSION: 1. Evaluation is somewhat limited by contrast in the vasculature from recent CT heart study. Within this limitation, no acute intracranial process. 2.  Air-fluid levels in the maxillary sinuses, which can be seen with acute sinusitis in the appropriate clinical setting. 3. ASPECTS is 10. Imaging results were communicated on 07/27/2022 at 11:14 am to provider Dr. Rory Percy via secure text paging. Electronically Signed   By: Merilyn Baba M.D.   On: 07/27/2022 11:14    Cardiac Studies   Transesophageal echo (07/26/2022)  IMPRESSIONS     1. Left ventricular ejection fraction, by estimation, is 60 to 65%. The  left ventricle has normal function. The left ventricle has no regional  wall motion abnormalities. There is moderate concentric left ventricular  hypertrophy.   2. Right ventricular systolic function is normal. The right ventricular  size is normal.   3. Left atrial size was mildly dilated. No left atrial/left atrial  appendage thrombus was detected. The LAA emptying velocity was 70 cm/s.   4. There is a mobile mass in the right atrium (described under tricuspid  valve).   5. There is exuberant mitral annulus calcification, up to 2 cm in  thickness. There is central hypoechogenicity. This could be due to caseous  necrosis or annular abscess. In the medial posterior annulus  (corresponding to the medial scallop (P3), the MAC  appears unusually mobile, suggesting tissue undermining. There is no clear  evidence of communication between the left heart chambers and the mitral  annulus cavity. The mitral valve is degenerative. Mild mitral valve  regurgitation. Severe mitral annular  calcification.   6. There is a highly mobile mass  attached to the right atrial wall at the  junction of the tricuspid annulus and the aortic annulus, at the  right-most segment of the right fibrous trigone. It measures 11 mm in  length and 3 mm in thickness and is highly  suggestive of vegetation. The tricuspid valve is abnormal. Tricuspid valve  regurgitation is mild to moderate.   7. There is a tiny filamentous mobile echodensity on the aortic  noncoronary cusp. This is most likely a Lambl's excrescence, although a  vegetation cannot be completely excluded. The large vegetation described  on the transthoracic echo is actually  attached to the tricuspid annulus and is located in the right atrium.  There is no clear evidence of aortic annulus abscess. The aortic valve is  normal in structure. Aortic valve regurgitation is not visualized.   8. There is Moderate (Grade III) layered plaque involving the aortic arch  and descending aorta.   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   Cardiac catheterization (07/25/2022)  gnostic Dominance: Right    Coronary CTA (07/27/2022)   Cardiac CT   TECHNIQUE: The patient was scanned on a [id number] slice scanner. A 120 kV retrospective scan was triggered in the ascending thoracic aorta at 140 HU's. Gantry rotation speed was 250 msecs and collimation was .6 mm. No beta blockade or nitro were given. The 3D data set was reconstructed in 5% intervals of the R-R cycle. Systolic and diastolic phases were analyzed on a dedicated work station using MPR, MIP and VRT modes. The patient received 80 cc of  contrast.   FINDINGS: Mild bi atrial enlargement No ASD/VSD/PFO. No pericardial effusion Normal ascending thoracic aorta  2.9 cm Moderate calcific atherosclerosis with normal arch vessels   AV: Tri leaflet No vegetation noted There is a small area of apparent contrast enhancement from non coronary sinus to the RA Cannot r/o fistula but correlates with mobile mass seen on TEE measuring 7.7 mm x 4.1 mm   Non coronary sinus: 2.85 cm   Right coronary sinus: 2.84 cm   Left coronary sinus: 2.74 cm   MV: Markedly abnormal posterior annulus. Mass on ventricular side of leaflet measuring 1.9 cm x 1.5 cm partially calcified but mixed tissue density with HU's 150 to 250 Apears to be some contrast enhancement The posterior leaflet has restricted motion with entire leaflet being thickened 1.2 cm x 2.9 cm The overall appearance appears more to be and annular abscess rather than caseating mitral annular calcification The mass extends posteriorly toward the coronary sinus   The non contrast images show calcified annular edges through out the mitral annulus with edge HU's as high as 650 The core area inside the MAC measures only 60-90 HU's   TV: Not well seen with contrast wash out in RV   PV: Not well seen with contrast wash out in RVOT   Coronary Arteries: 1-24% calcified plaque in proximal and mid vessel 25-49% calcified plaque in PDA/PLB LM is normal LAD with 25-40% calcified plaque in proximal and mid vessel Normal diagonal and IM branches LCX 1024% soft plaque proximally and 1024% calcified plaque distally .   IMPRESSION: 1. CAD RADS 2 non obstructive CAD see description above study done without nitro or beta blocker   2. 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 Contrast enhancement and large central area of mixed HU's cannot really tell if severe caseating MAC vs annular abscess but latter may be more likely FDG PET CT would likely be best  distinguishing test between degenerative disease vs infection/inflammation   3. Contrast enhancing small mass at base of septal TV leaflet near non coronary aortic annulus   Jenkins Rouge Patient Profile     62 y.o. female with a hx of HTN, HLD, DM 2, CVA, ESRD on HD (MWF) who was seen 07/24/2022 for the evaluation of acute acute coronary syndrome at the request of ED.   Assessment & Plan    1: Non-STEMI-troponins rose to 6200.  Catheter showed no obstructive CAD suggesting stress cardiomyopathy.  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  3: End-stage renal disease-on hemodialysis Monday/Wednesday/Friday  4: Pneumonia-multifocal on chest x-ray.  On antibiotics followed by the primary service.  5: Hyperlipidemia-LDL 133 on atorvastatin 40 recently started.  6: SBE-complex situation on coronary CTA and TEE.  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  She was evaluated by Dr. Cyndia Bent who did not think she was a surgical candidate given her severe comorbidities and extensiveness of her disease.  He recommended continued antibiotic therapy.  7: Stroke-patient had multifocal changes on her MRI suggesting embolic event.  She was minimally communicative yesterday but is communicative today.  Being seen by the stroke service.  Agree with antiplatelet therapy and antibiotic therapy for SBE at this time.     For questions or updates, please contact [place] Please consult [email or web address] for contact info under        Signed, Quay Burow, MD  07/29/2022, 8:59 AM

## 2022-07-29 NOTE — Progress Notes (Signed)
Daily Progress Note Intern Pager: [phone]  Patient name: [name] Medical record number: [id number] Date of birth: [birth year] Age: 62 y.o. Gender: female  Primary Care Provider: Lowry Ram, MD Consultants: Cardiology, Nephrology, ID, Neurology, CTS Code Status: Full  Pt Overview and Major Events to Date:  3/20 Admitted 3/21 [place] 3/22 TEE 3/23 CT coronary morph, CODE STROKE  Assessment and Plan: Ms. Navalta is a 62 year old woman with PMH of ESRD on HD MWF, HTN, T2DM, CVA, paroxysmal VT, HLD, GERD currently admitted for suspected acute bacterial endocarditis, initially presenting with pneumonia and NSTEMI.   * Acute bacterial endocarditis VSS. Afebrile. Cardiology recommends FDG PET CT to distinguish degenerative disease vs inflammation, however this scan is not available at [facility]. NGTD repeat Bcx. NSTEMI resolved but high risk for repeat cardiac event. - ID consulted, recs appreciated - Cardiology consulted, appreciate recs - CTS consulted, recs appreciated - not a surgical candidate - Continue Vancomycin 1000 mg MWF, day 6 - Repeat Bcx NGTD 3 days  Stroke due to embolism [facility]) Neuro exam today stable.  - Appreciate ongoing care by stroke team - Cleared by SLP - Aspirin, subQ heparin - Neuro checks   Localized swelling of chest wall Uncertain chronicity. Chest xray no acute bony abnormality. No sign of erythema or drainage.  -Continue to monitor -Consider mandibular chest x ray view -Consider soft tissue US  Multifocal pneumonia Respiratory status stable. - ID consulted, appreciate recommendations - Continue vancomycin day 6 - Continue to follow original and repeat blood cultures    ESRD on dialysis [facility]) HD on MWF. Renal function at baseline reassuringly despite repeat contrast. -Nephrology consulted recs, appreciated -Electrolyte management per dialysis  -Velphoro, Sensipar       FEN/GI: Heart healthy, carb modified  PPx: ASA, SubQ Heparin   Dispo:Pending clinical improvement  Subjective:  NAEO. Patient states she is doing much better today. States that her sternal swelling is new, and slightly painful.   Objective: Temp:  [97.4 F (36.3 C)-98.6 F (37 C)] 97.7 F (36.5 C) (03/25 0022) Pulse Rate:  [76-89] 76 (03/25 0421) Resp:  [15-19] 15 (03/25 0421) BP: (105-142)/(50-67) 124/50 (03/25 0421) SpO2:  [97 %-100 %] 100 % (03/25 0421) Weight:  [93.9 kg] 93.9 kg (03/25 0420) Physical Exam: General: NAD, lying comfortably in hospital bed Neuro: A&O, strength 5/5 RUE, 4/5 LUE, symmetric smile Cardiovascular: RRR, no murmurs, no peripheral edema, sternal TTP Respiratory: normal WOB on RA, CTAB, no wheezes, ronchi or rales Abdomen: soft, NTTP, no rebound or guarding Extremities: Moving all 4 extremities equally   Laboratory: Most recent CBC Lab Results  Component Value Date   WBC 9.6 07/29/2022   HGB 10.4 (L) 07/29/2022   HCT 31.2 (L) 07/29/2022   MCV 85.7 07/29/2022   PLT 379 07/29/2022   Most recent BMP    Latest Ref Rng & Units 07/29/2022   12:27 AM  BMP  Glucose 70 - 99 mg/dL 89   BUN 8 - 23 mg/dL 45   Creatinine 0.44 - 1.00 mg/dL 10.28   Sodium 135 - 145 mmol/L 129   Potassium 3.5 - 5.1 mmol/L 3.7   Chloride 98 - 111 mmol/L 90   CO2 22 - 32 mmol/L 24   Calcium 8.9 - 10.3 mg/dL 8.8     Other pertinent labs: none   Imaging/Diagnostic Tests: CT Head w/o contrast 07/28/22 IMPRESSION: 1. Abundant intravascular contrast persists, query Renal Insufficiency if no repeat iodinated contrast dose since yesterday. 2. Small  embolic cerebral infarcts remain occult by CT. No intracranial mass effect. No definite acute intracranial hemorrhage. 3. Chronic white matter disease. 4. Paranasal sinus inflammation.  Salvadore Oxford, MD 07/29/2022, 7:42 AM  PGY-1, [place] Intern pager: [phone], text pages welcome Secure chat group [place]

## 2022-07-29 NOTE — Care Management Important Message (Signed)
Important Message  Patient Details  Name: [name] MRN: [id number] Date of Birth: 03-10-1961   Medicare Important Message Given:  Yes     Shelda Altes 07/29/2022, 9:08 AM

## 2022-07-29 NOTE — Progress Notes (Addendum)
Critical trop 292. Dr. Jeani Hawking notified. Continue to trend.

## 2022-07-29 NOTE — Progress Notes (Signed)
[facility] for Infectious Disease    Date of Admission:  07/23/2022   Total days of antibiotics 6           ID: [name] is a 62 y.o. 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    Subjective: Afebrile but has some slight pain about right [place] joint;tolerating HD  Medications:   aspirin EC  81 mg Oral Daily   atorvastatin  40 mg Oral Daily   cinacalcet  90 mg Oral Q M,W,F-HD   darbepoetin (ARANESP) injection - DIALYSIS  25 mcg Subcutaneous Q Mon-1800   feeding supplement (NEPRO CARB STEADY)  237 mL Oral BID BM   heparin injection (subcutaneous)  5,000 Units Subcutaneous Q8H   heparin sodium (porcine)       isosorbide-hydrALAZINE  1 tablet Oral TID   lidocaine  1 patch Transdermal Q24H   metoprolol tartrate  12.5 mg Oral BID   pantoprazole  40 mg Oral Daily   polyethylene glycol  17 g Oral Daily   sucroferric oxyhydroxide  500 mg Oral TID WC    Objective: Vital signs in last 24 hours: Temp:  [97.3 F (36.3 C)-98.5 F (36.9 C)] 97.7 F (36.5 C) (03/25 1230) Pulse Rate:  [76-91] 91 (03/25 1230) Resp:  [13-27] 18 (03/25 1230) BP: (92-164)/(50-75) 102/55 (03/25 1230) SpO2:  [97 %-100 %] 100 % (03/25 1230) Weight:  [93.8 kg-93.9 kg] 93.8 kg (03/25 0757)  Physical Exam  Constitutional:  oriented to person. appears well-developed and well-nourished. No distress.  HENT: August/AT, PERRLA, no scleral icterus Mouth/Throat: Oropharynx is clear and moist. No oropharyngeal exudate.  Cardiovascular: Normal rate,  regular rhythm and normal heart sounds. Exam reveals no gallop and no friction rub.  Chest wall: swelling/raised area of [place] joint No murmur heard.  Pulmonary/Chest: Effort normal and breath sounds normal. No respiratory distress.  has no wheezes.  Neck = supple, no nuchal rigidity Abdominal: Soft. Bowel sounds are normal.  exhibits no distension. There is no tenderness.  Lymphadenopathy: no cervical adenopathy. No axillary adenopathy Neurological: alert and oriented to person. Skin: Skin is warm and dry. No rash noted. No erythema.  Psychiatric: a normal mood and affect.  behavior is normal.    Lab Results Recent Labs    07/28/22 0245 07/29/22 0027  WBC 7.6 9.6  HGB 10.2* 10.4*  HCT 31.9* 31.2*  NA 132* 129*  K 3.7 3.7  CL 92* 90*  CO2 26 24  BUN 36* 45*  CREATININE 8.57* 10.28*   Liver Panel Recent Labs    07/28/22 0245 07/29/22 0027  ALBUMIN 2.8* 2.9*    Microbiology: 3/19 1 bottle: corynbacteremia plus staph capitis 3/19 1 bottle: corynbacteremia plus enterococcus Studies/Results: CT HEAD WO CONTRAST (5MM)  Result Date: 07/28/2022 CLINICAL DATA:  62 year old female code stroke presentation yesterday. Numerous scattered small embolic appearing infarcts on brain MRI. EXAM: CT HEAD WITHOUT CONTRAST TECHNIQUE: Contiguous axial images were obtained from the base of the skull through the vertex without intravenous contrast. RADIATION DOSE REDUCTION:  This exam was performed according to the departmental dose-optimization program which includes automated exposure control, adjustment of the mA and/or kV according to patient size and/or use of iterative reconstruction technique. COMPARISON:  Head CT, CTA head and neck, cardiac CTA, and brain MRI yesterday. FINDINGS: Brain: Intravascular contrast persists. This limits evaluation for hyperdense intracranial hemorrhage. No midline shift or intracranial mass effect. No ventriculomegaly. No IVH is evident. Confluent bilateral cerebral  white matter hypodensity redemonstrated. The scattered small gray and white matter infarcts demonstrated by MRI yesterday remain largely occult by CT. No superimposed major vascular territory infarct identified. Vascular: Abundant intravascular contrast persists, query renal insufficiency if no repeat contrast dose since yesterday. The major intracranial vascular structures are enhancing as expected. Skull: No acute osseous abnormality identified. Sinuses/Orbits: Small sinus fluid levels, scattered bubbly opacity and mucosal thickening has not significantly changed. Tympanic cavities and mastoids remain well aerated. Other: No acute orbit or scalp soft tissue finding. IMPRESSION: 1. Abundant intravascular contrast persists, query Renal Insufficiency if no repeat iodinated contrast dose since yesterday. 2. Small embolic cerebral infarcts remain occult by CT. No intracranial mass effect. No definite acute intracranial hemorrhage. 3. Chronic white matter disease. 4. Paranasal sinus inflammation. Electronically Signed   By: Genevie Ann M.D.   On: 07/28/2022 09:53   MR BRAIN WO CONTRAST  Result Date: 07/28/2022 CLINICAL DATA:  Initial evaluation for neuro deficit, stroke suspected. EXAM: MRI HEAD WITHOUT CONTRAST TECHNIQUE: Multiplanar, multiecho pulse sequences of the brain and surrounding structures were obtained without intravenous contrast. COMPARISON:  Prior CTs from earlier the same day. FINDINGS: Brain: Cerebral volume within normal limits. Patchy and confluent T2/FLAIR hyperintensity involving the periventricular deep white matter both cerebral hemispheres as well as the pons, consistent with chronic small vessel ischemic disease, moderately advanced in nature. Small remote cortical subcortical infarct involving the right frontal lobe noted. Few small remote cerebellar infarcts noted as well. Multiple scattered foci of restricted diffusion are seen involving the bilateral cerebral hemispheres, consistent with  acute ischemic infarcts. For reference purposes, the largest of these foci is seen at the subcortical left frontal lobe in measures 1 cm (series 7, image 58). Patchy involvement of the ventral pons noted as well (series 5, image 69). No associated hemorrhage or mass effect. A central thromboembolic etiology is suspected given the various vascular distributions involved. No mass lesion, midline shift or mass effect. No hydrocephalus or extra-axial fluid collection. Pituitary gland and suprasellar region within normal limits. Vascular: Major intracranial vascular flow voids are maintained. Skull and upper cervical spine: Craniocervical junction within normal limits. Bone marrow signal intensity normal. No scalp soft tissue abnormality. Sinuses/Orbits: Prior bilateral ocular lens replacement. Scattered mucosal thickening present throughout the paranasal sinuses. Small volume layering secretions present within the left sphenoid and maxillary sinuses. No mastoid effusion. Other: None. IMPRESSION: 1. Multiple scattered subcentimeter acute ischemic infarcts involving the bilateral cerebral hemispheres and pons. No associated hemorrhage or mass effect. A central thromboembolic etiology is suspected given the various vascular distributions involved. 2. Underlying moderately advanced chronic microvascular ischemic disease with a few scattered remote infarcts involving the right frontal lobe and cerebellum. Electronically Signed   By: Jeannine Boga M.D.   On: 07/28/2022 00:37   DG Chest 1 View  Result Date: 07/27/2022 CLINICAL DATA:  Endocarditis EXAM: PORTABLE CHEST 1 VIEW COMPARISON:  07/23/2022 FINDINGS: Cardiac shadow is enlarged but stable. Aortic calcifications are seen. Previously seen patchy infiltrates have resolved in the interval. No new focal infiltrate is seen. No bony  abnormality is noted. IMPRESSION: No active disease. Electronically Signed   By: Inez Catalina M.D.   On: 07/27/2022 21:36      Assessment/Plan: Polymicrobial native MV endocarditis = continue on vancomycin. Will have micro do extended micro testing. Unusual pathogen. Vancomycin to cover what has been isolated. Plan for 6 wk with HD  [place] joint inflammation, questionable = recommend mr of sternum without contrast  [facility] Pager: [phone]  07/29/2022, 12:52 PM

## 2022-07-29 NOTE — H&P (Incomplete)
Physical Medicine and Rehabilitation Admission H&P    Chief Complaint  Patient presents with   Functional deficits due to encephalopathy/bacteremia    HPI:  Dorri Bergara. 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   Aortic atherosclerosis (HCC)    Arthritis of knee    bilateral   Cerebellar cerebrovascular accident (CVA) without late effect 09/23/2019   Coagulation defect, unspecified ([place]) 02/19/2019   Diabetes mellitus    type 2 - no meds   Diabetic gastroparesis (HCC)    Diverticulosis    ESRD (end stage renal disease) ([place])    Fibroid uterus    GERD (gastroesophageal reflux disease)    Hyperlipidemia    Hypertension    Hypertensive emergency 02/05/2019   Hypokalemia    IDA (iron deficiency anemia)    Iron deficiency anemia, unspecified 02/24/2019   Nausea    Nausea and vomiting in adult 03/13/2022   Paroxysmal ventricular tachycardia (HCC)    Renal disorder    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[place])    Umbilical hernia    Wears dentures     Past Surgical History:  Procedure Laterality Date   AV FISTULA PLACEMENT Left 02/17/2019   Procedure: ARTERIOVENOUS (AV) FISTULA CREATION LEFT UPPER ARM;  Surgeon: Waynetta Sandy, MD;  Location: [facility];  Service: Vascular;  Laterality: Left;   CATARACT EXTRACTION     right eye   CESAREAN SECTION     x2   CHOLECYSTECTOMY     laparoscopic   CORONARY ANGIOGRAPHY N/A 07/25/2022   Procedure: CORONARY ANGIOGRAPHY;  Surgeon: [name], [name], MD;  Location: [facility];  Service: Cardiovascular;  Laterality: N/A;   FISTULA SUPERFICIALIZATION Left 04/06/2019   Procedure: FISTULA SUPERFICIALIZATION LEFT BRACHIOCEPHALIC;  Surgeon: Waynetta Sandy, MD;  Location: [facility];  Service: Vascular;  Laterality: Left;  Transposition left arm brachiocephalic fistula.    IR FLUORO GUIDE CV LINE RIGHT  02/09/2019   IR FLUORO GUIDE CV LINE RIGHT  03/05/2019   IR US GUIDE VASC ACCESS RIGHT  02/09/2019   MULTIPLE TOOTH EXTRACTIONS     REDUCTION MAMMAPLASTY Bilateral    TEE WITHOUT CARDIOVERSION N/A 07/26/2022   Procedure: TRANSESOPHAGEAL ECHOCARDIOGRAM (TEE);  Surgeon: [name], MD;  Location: [facility] END[facility];  Service: Cardiovascular;  Laterality: N/A;    Family History  Problem Relation Age of Onset   Hypertension Mother    Diabetes Mother    Cancer Father        lung   Colon cancer Neg Hx    Stomach cancer Neg Hx    Esophageal cancer Neg Hx     Social History:  reports that she quit smoking about 10 years ago. Her smoking use included cigarettes. She has been exposed to tobacco smoke. She has never used smokeless tobacco. She reports that she does not drink alcohol and does not use drugs.   Allergies  Allergen Reactions   Lisinopril Anaphylaxis and Other (See Comments)    angioedema   Venofer  [Ferric Oxide]     Other reaction(s): Back Pain   Camellia Swelling and Other (See Comments)    Angioedema    Jardiance [Empagliflozin] Swelling and Rash    Medications Prior to Admission  Medication Sig Dispense Refill   acetaminophen (TYLENOL) 500 MG tablet Take 1,000 mg by mouth  daily as needed for moderate pain.     amLODipine (NORVASC) 10 MG tablet Take 10 mg by mouth daily.     aspirin 325 MG tablet Take 325 mg by mouth daily.     fluticasone (FLONASE) 50 MCG/ACT nasal spray Place 2 sprays into both nostrils daily. (Patient taking differently: Place 2 sprays into both nostrils daily as needed for allergies.) 16 g 6   lactulose (CHRONULAC) 10 GM/15ML solution Take 10 g by mouth daily as needed for mild constipation.     metoCLOPramide (REGLAN) 10 MG tablet Take 1 tablet (10 mg total) by mouth 4 (four) times daily -  before meals and at bedtime. Take 1/2 to 1 tablet prn 30 tablet 1   Multiple Vitamin (DAILY-VITE) TABS Take 1 tablet by mouth daily.      pantoprazole (PROTONIX) 40 MG tablet Take 1 tablet (40 mg total) by mouth daily. 90 tablet 3   polyethylene glycol (MIRALAX / GLYCOLAX) 17 g packet Take 17 g by mouth daily. 14 each 0   sucroferric oxyhydroxide (VELPHORO) 500 MG chewable tablet Chew 500 mg by mouth 3 (three) times daily with meals.     traMADol (ULTRAM) 50 MG tablet Take 50 mg by mouth in the morning, at noon, in the evening, and at bedtime.     Accu-Chek Softclix Lancets lancets Use as instructed (Patient taking differently: 1 each by Other route See admin instructions. Use as instructed) 100 each 12   Blood Glucose Monitoring Suppl (ACCU-CHEK AVIVA PLUS) w/Device KIT Use to check sugar three times a day (Patient taking differently: 1 each by Other route in the morning, at noon, and at bedtime.) 1 kit 0   glucose blood (ACCU-CHEK AVIVA PLUS) test strip Use as instructed (Patient taking differently: 1 each by Other route See admin instructions. Use as instructed) 100 each 12   Lancets (ACCU-CHEK SOFT TOUCH) lancets Use to check sugars three times a day (Patient taking differently: 1 each by Other route in the morning, at noon, and at bedtime.) 100 each 12   rosuvastatin (CRESTOR) 5 MG tablet Take 1 tablet (5 mg total) by mouth daily. (Patient not taking: Reported on 07/24/2022) 30 tablet 2      Home: [place] expects to be discharged to:: Private residence Living Arrangements: Children Available Help at Discharge: Family, Available PRN/intermittently Type of Home: Apartment Home Access: Stairs to enter Technical brewer of Steps: flight Entrance Stairs-Rails: Right Home Layout: One level Bathroom Shower/Tub: Chiropodist: Standard Bathroom Accessibility: Yes Home Equipment: None  Lives With: Son   Functional History: Prior Function Prior Level of Function : Independent/Modified Independent Mobility Comments: ambulatory without DME, 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  Functional Status:  Mobility: Bed Mobility Overal bed mobility: Needs Assistance Bed Mobility: Supine to Sit Supine to sit: Mod assist, +2 for physical assistance Sit to supine: Max assist, +2 for physical assistance General bed mobility comments: use of bed pad to scoot hips to EOB Transfers Overall transfer level: Needs assistance Equipment used: 2 person hand held assist Transfers: Sit to/from Stand, Bed to chair/wheelchair/BSC Sit to Stand: Max assist, +2 physical assistance, Total assist Bed to/from chair/wheelchair/BSC transfer type:: Step pivot Step pivot transfers: Mod assist, +2 physical assistance, +2 safety/equipment General transfer comment: unable to take/steps/weight shift in standing Ambulation/Gait Ambulation/Gait assistance:  (transport arrived for HDU, limiting evaluation) General Gait Details: deferred    ADL: ADL Overall ADL's : Needs assistance/impaired Eating/Feeding: Minimal assistance, Bed level  Grooming: Minimal assistance, Bed level Upper Body Bathing: Bed level, Moderate assistance Lower Body Bathing: Maximal assistance, Bed level Upper Body Dressing : Bed level, Moderate assistance Lower Body Dressing: Maximal assistance, Bed level Toilet Transfer: Maximal assistance, Total assistance, +2 for physical assistance Toileting- Clothing Manipulation and Hygiene: Total assistance Functional mobility during ADLs: Maximal assistance, Total assistance, +2 for physical assistance General ADL Comments: Pt limited by onset of dizziness EOB. Mod A for static sitting balance. Posterior lean and LOB.  Cognition: Cognition Overall Cognitive Status: Impaired/Different from baseline Arousal/Alertness: Awake/alert Orientation Level: Oriented X4 Year: 2023 Month: January Day of Week: Incorrect Attention: Sustained Sustained Attention: Impaired Sustained Attention Impairment: Verbal basic, Functional basic Memory: Impaired Memory Impairment: Storage  deficit Awareness: Impaired Awareness Impairment: Intellectual impairment Problem Solving: Impaired Problem Solving Impairment: Verbal basic Executive Function: Initiating Initiating: Impaired Initiating Impairment: Verbal basic, Functional basic Cognition Arousal/Alertness: Awake/alert Behavior During Therapy: Flat affect Overall Cognitive Status: Impaired/Different from baseline Area of Impairment: Safety/judgement, Following commands, Attention, Problem solving Current Attention Level: Selective Following Commands: Follows one step commands with increased time Safety/Judgement: Decreased awareness of deficits, Decreased awareness of safety Awareness: Intellectual Problem Solving: Slow processing, Decreased initiation, Difficulty sequencing, Requires verbal cues, Requires tactile cues General Comments: increased time to state place, states she is at [facility], and it is June, when given hint of month, pt then states it is May. Able to recognize state sister's name upon arrival when sitting EOB, increased time needed to recall sister's name once returned to supine  Physical Exam: Blood pressure (!) 102/55, pulse 91, temperature 97.7 F (36.5 C), resp. rate 18, height 5\' 10"  (1.778 m), weight 93.8 kg, SpO2 100 %. Physical Exam  Results for orders placed or performed during the hospital encounter of 07/23/22 (from the past 48 hour(s))  Renal function panel     Status: Abnormal   Collection Time: 07/27/22  3:05 PM  Result Value Ref Range   Sodium 130 (L) 135 - 145 mmol/L   Potassium 3.8 3.5 - 5.1 mmol/L   Chloride 91 (L) 98 - 111 mmol/L   CO2 24 22 - 32 mmol/L   Glucose, Bld 164 (H) 70 - 99 mg/dL    Comment: Glucose reference range applies only to samples taken after fasting for at least 8 hours.   BUN 34 (H) 8 - 23 mg/dL   Creatinine, Ser 7.63 (H) 0.44 - 1.00 mg/dL   Calcium 8.0 (L) 8.9 - 10.3 mg/dL   Phosphorus 5.0 (H) 2.5 - 4.6 mg/dL   Albumin 2.8 (L) 3.5 - 5.0 g/dL   GFR,  Estimated 6 (L) >60 mL/min    Comment: (NOTE) Calculated using the CKD-EPI Creatinine Equation (2021)    Anion gap 15 5 - 15    Comment: Performed at [facility] [address] [place], [place] [address]  CBC     Status: Abnormal   Collection Time: 07/28/22  2:45 AM  Result Value Ref Range   WBC 7.6 4.0 - 10.5 K/uL   RBC 3.66 (L) 3.87 - 5.11 MIL/uL   Hemoglobin 10.2 (L) 12.0 - 15.0 g/dL   HCT 31.9 (L) 36.0 - 46.0 %   MCV 87.2 80.0 - 100.0 fL   MCH 27.9 26.0 - 34.0 pg   MCHC 32.0 30.0 - 36.0 g/dL   RDW 14.8 11.5 - 15.5 %   Platelets 327 150 - 400 K/uL   nRBC 0.0 0.0 - 0.2 %    Comment: Performed at [facility], [place] [address], [place], [place] [address]  Renal function panel     Status: Abnormal   Collection Time: 07/28/22  2:45 AM  Result Value Ref Range   Sodium 132 (L) 135 - 145 mmol/L   Potassium 3.7 3.5 - 5.1 mmol/L   Chloride 92 (L) 98 - 111 mmol/L   CO2 26 22 - 32 mmol/L   Glucose, Bld 83 70 - 99 mg/dL    Comment: Glucose reference range applies only to samples taken after fasting for at least 8 hours.   BUN 36 (H) 8 - 23 mg/dL   Creatinine, Ser 8.57 (H) 0.44 - 1.00 mg/dL   Calcium 8.4 (L) 8.9 - 10.3 mg/dL   Phosphorus 5.6 (H) 2.5 - 4.6 mg/dL   Albumin 2.8 (L) 3.5 - 5.0 g/dL   GFR, Estimated 5 (L) >60 mL/min    Comment: (NOTE) Calculated using the CKD-EPI Creatinine Equation (2021)    Anion gap 14 5 - 15    Comment: Performed at [facility] [address], [place], [place] [address]  Glucose, capillary     Status: None   Collection Time: 07/28/22  8:38 AM  Result Value Ref Range   Glucose-Capillary 88 70 - 99 mg/dL    Comment: Glucose reference range applies only to samples taken after fasting for at least 8 hours.  Renal function panel     Status: Abnormal   Collection Time: 07/29/22 12:27 AM  Result Value Ref Range   Sodium 129 (L) 135 - 145 mmol/L   Potassium 3.7 3.5 - 5.1 mmol/L   Chloride 90 (L) 98 - 111 mmol/L   CO2 24 22 - 32  mmol/L   Glucose, Bld 89 70 - 99 mg/dL    Comment: Glucose reference range applies only to samples taken after fasting for at least 8 hours.   BUN 45 (H) 8 - 23 mg/dL   Creatinine, Ser 10.28 (H) 0.44 - 1.00 mg/dL   Calcium 8.8 (L) 8.9 - 10.3 mg/dL   Phosphorus 6.9 (H) 2.5 - 4.6 mg/dL   Albumin 2.9 (L) 3.5 - 5.0 g/dL   GFR, Estimated 4 (L) >60 mL/min    Comment: (NOTE) Calculated using the CKD-EPI Creatinine Equation (2021)    Anion gap 15 5 - 15    Comment: Performed at [facility] [address] [place], [place] [address]  CBC     Status: Abnormal   Collection Time: 07/29/22 12:27 AM  Result Value Ref Range   WBC 9.6 4.0 - 10.5 K/uL   RBC 3.64 (L) 3.87 - 5.11 MIL/uL   Hemoglobin 10.4 (L) 12.0 - 15.0 g/dL   HCT 31.2 (L) 36.0 - 46.0 %   MCV 85.7 80.0 - 100.0 fL   MCH 28.6 26.0 - 34.0 pg   MCHC 33.3 30.0 - 36.0 g/dL   RDW 15.0 11.5 - 15.5 %   Platelets 379 150 - 400 K/uL   nRBC 0.0 0.0 - 0.2 %    Comment: Performed at [facility], [place] [address] [place], [place] [address]   CT HEAD WO CONTRAST (5MM)  Result Date: 07/28/2022 CLINICAL DATA:  62 year old female code stroke presentation yesterday. Numerous scattered small embolic appearing infarcts on brain MRI. EXAM: CT HEAD WITHOUT CONTRAST TECHNIQUE: Contiguous axial images were obtained from the base of the skull through the vertex without intravenous contrast. RADIATION DOSE REDUCTION: This exam was performed according to the departmental dose-optimization program which includes automated  exposure control, adjustment of the mA and/or kV according to patient size and/or use of iterative reconstruction technique. COMPARISON:  Head CT, CTA head and neck, cardiac CTA, and brain MRI yesterday. FINDINGS: Brain: Intravascular contrast persists. This limits evaluation for hyperdense intracranial hemorrhage. No midline shift or intracranial mass effect. No ventriculomegaly. No IVH is evident. Confluent bilateral cerebral white  matter hypodensity redemonstrated. The scattered small gray and white matter infarcts demonstrated by MRI yesterday remain largely occult by CT. No superimposed major vascular territory infarct identified. Vascular: Abundant intravascular contrast persists, query renal insufficiency if no repeat contrast dose since yesterday. The major intracranial vascular structures are enhancing as expected. Skull: No acute osseous abnormality identified. Sinuses/Orbits: Small sinus fluid levels, scattered bubbly opacity and mucosal thickening has not significantly changed. Tympanic cavities and mastoids remain well aerated. Other: No acute orbit or scalp soft tissue finding. IMPRESSION: 1. Abundant intravascular contrast persists, query Renal Insufficiency if no repeat iodinated contrast dose since yesterday. 2. Small embolic cerebral infarcts remain occult by CT. No intracranial mass effect. No definite acute intracranial hemorrhage. 3. Chronic white matter disease. 4. Paranasal sinus inflammation. Electronically Signed   By: Genevie Ann M.D.   On: 07/28/2022 09:53   MR BRAIN WO CONTRAST  Result Date: 07/28/2022 CLINICAL DATA:  Initial evaluation for neuro deficit, stroke suspected. EXAM: MRI HEAD WITHOUT CONTRAST TECHNIQUE: Multiplanar, multiecho pulse sequences of the brain and surrounding structures were obtained without intravenous contrast. COMPARISON:  Prior CTs from earlier the same day. FINDINGS: Brain: Cerebral volume within normal limits. Patchy and confluent T2/FLAIR hyperintensity involving the periventricular deep white matter both cerebral hemispheres as well as the pons, consistent with chronic small vessel ischemic disease, moderately advanced in nature. Small remote cortical subcortical infarct involving the right frontal lobe noted. Few small remote cerebellar infarcts noted as well. Multiple scattered foci of restricted diffusion are seen involving the bilateral cerebral hemispheres, consistent with acute  ischemic infarcts. For reference purposes, the largest of these foci is seen at the subcortical left frontal lobe in measures 1 cm (series 7, image 58). Patchy involvement of the ventral pons noted as well (series 5, image 69). No associated hemorrhage or mass effect. A central thromboembolic etiology is suspected given the various vascular distributions involved. No mass lesion, midline shift or mass effect. No hydrocephalus or extra-axial fluid collection. Pituitary gland and suprasellar region within normal limits. Vascular: Major intracranial vascular flow voids are maintained. Skull and upper cervical spine: Craniocervical junction within normal limits. Bone marrow signal intensity normal. No scalp soft tissue abnormality. Sinuses/Orbits: Prior bilateral ocular lens replacement. Scattered mucosal thickening present throughout the paranasal sinuses. Small volume layering secretions present within the left sphenoid and maxillary sinuses. No mastoid effusion. Other: None. IMPRESSION: 1. Multiple scattered subcentimeter acute ischemic infarcts involving the bilateral cerebral hemispheres and pons. No associated hemorrhage or mass effect. A central thromboembolic etiology is suspected given the various vascular distributions involved. 2. Underlying moderately advanced chronic microvascular ischemic disease with a few scattered remote infarcts involving the right frontal lobe and cerebellum. Electronically Signed   By: Jeannine Boga M.D.   On: 07/28/2022 00:37   DG Chest 1 View  Result Date: 07/27/2022 CLINICAL DATA:  Endocarditis EXAM: PORTABLE CHEST 1 VIEW COMPARISON:  07/23/2022 FINDINGS: Cardiac shadow is enlarged but stable. Aortic calcifications are seen. Previously seen patchy infiltrates have resolved in the interval. No new focal infiltrate is seen. No bony abnormality is noted. IMPRESSION: No active disease. Electronically Signed   By: Elta Guadeloupe  Lukens M.D.   On: 07/27/2022 21:36      Blood  pressure (!) 102/55, pulse 91, temperature 97.7 F (36.5 C), resp. rate 18, height 5\' 10"  (1.778 m), weight 93.8 kg, SpO2 100 %.  Medical Problem List and Plan: 1. Functional deficits secondary to ***  -patient may *** shower  -ELOS/Goals: *** 2.  Antithrombotics: -DVT/anticoagulation:  {VTE PROPHYLAXIS/ANTICOAGULATION - [id number]  -antiplatelet therapy: *** 3. Pain Management: *** 4. Mood/Behavior/Sleep: ***  -antipsychotic agents: *** 5. Neuropsych/cognition: This patient *** capable of making decisions on *** own behalf. 6. Skin/Wound Care: *** 7. Fluids/Electrolytes/Nutrition: ***     ***  Bary Leriche, PA-C 07/29/2022

## 2022-07-29 NOTE — Progress Notes (Signed)
Inpatient Rehab Admissions Coordinator:   Stopped by to see pt but sleeping and does not answer to voice or tactile stimulation.  Note declining participation with therapy today.  Will f/u tomorrow.   Shann Medal, PT, DPT Admissions Coordinator [phone] 07/29/22  3:37 PM

## 2022-07-29 NOTE — Progress Notes (Signed)
Subjective: Minimal interaction today but denies complaints and tolerating dialysis with no issues  Objective Vital signs in last 24 hours: Vitals:   07/29/22 0817 07/29/22 0830 07/29/22 0900 07/29/22 0930  BP: (!) 149/69 (!) 140/73 139/75 134/67  Pulse: 82 80 80 84  Resp: (!) 24 17 17  (!) 24  Temp:      TempSrc:      SpO2: 100% 100% 100% 100%  Weight:      Height:       Weight change: 2.3 kg  Physical Exam: General: Alert elderly female NAD, minimally verbal Heart: normal rate Lungs: Bilateral chest rise with no increased work of breathing Abdomen: NABS soft NT ND Extremities: No pedal edema Dialysis Access: LUA AVF+ bruit   OP dialysis Orders: [facility]WF [facility]  4h  400/500   93kg   2/2 bath  LUA AVF  Heparin 4000+ 2060midrun - last HD 3/15, post wt was 92.9kg - venofer 100mg  tiw IV thru 3/25 - sensipar 90 mg po tiw - mircera 30 mcg IV q 2 wks, last 3/1, due 3/15 but held due to Hb 11.1     Problem/Plan: ESRD - on HD MWF. per regular schedule,  NSTEMI/Acute systolic HF/perivalvular abscess-cardiology evaluating ,elevated troponin's, getting IV heparin. Cardiac cath 3/21 with no CAD. Wall motion consistent with Takotsubo CM. TEE = concern for  perivalvular absces and right atrial wall mass /cardiac CTA, currently no plans for surgical intervention and treating with antibiotics. 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  No plans for surgical intervention Multifocal PNA - CXR w/ R sided infiltrates. On ABX.  HTN - BP in goal.  On metoprolol and BiDil Volume - continue optimizing with dialysis Anemia esrd -he will globin 10.4.  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  Phosphorus mildly elevated.  Cont sensipar and binders.     07/29/2022,9:48 AM  LOS: 5 days    Labs: Basic Metabolic Panel: Recent Labs  Lab 07/27/22 1505 07/28/22 0245 07/29/22 0027  NA 130* 132* 129*  K 3.8 3.7 3.7  CL 91* 92* 90*  CO2 24 26 24   GLUCOSE 164* 83 89  BUN 34* 36* 45*  CREATININE 7.63* 8.57* 10.28*  CALCIUM 8.0* 8.4* 8.8*  PHOS 5.0* 5.6* 6.9*   Liver Function Tests: Recent Labs  Lab 07/23/22 2244 07/24/22 0120 07/25/22 0158 07/27/22 1505 07/28/22 0245 07/29/22 0027  AST 44* 48* 47*  --   --   --   ALT 19 17 20   --   --   --   ALKPHOS 57 51 49  --   --   --   BILITOT 0.8 0.6 0.9  --   --   --   PROT 7.5 7.0 6.6  --   --   --   ALBUMIN 3.2* 3.0* 2.7* 2.8* 2.8* 2.9*   No results for input(s): "LIPASE", "AMYLASE" in the last 168 hours. No results for input(s): "AMMONIA" in the last 168 hours. CBC: Recent Labs  Lab 07/23/22 2244 07/23/22 2252 07/26/22 0244 07/26/22 1414 07/27/22 0126 07/28/22 0245 07/29/22 0027  WBC 12.6*   < > 5.6 7.4 8.1 7.6 9.6  NEUTROABS 11.3*  --   --   --   --   --   --   HGB 12.1   < > 10.0* 10.6* 11.1* 10.2* 10.4*  HCT 36.9   < >  30.2* 33.1* 33.8* 31.9* 31.2*  MCV 86.0   < > 85.6 86.9 85.8 87.2 85.7  PLT 252   < > 249 260 296 327 379   < > = values in this interval not displayed.   Cardiac Enzymes: Recent Labs  Lab 07/23/22 2244  CKTOTAL 1,948*   CBG: Recent Labs  Lab 07/23/22 2239 07/26/22 0854 07/27/22 1052 07/28/22 0838  GLUCAP 137* 78 82 88    Medications:  anticoagulant sodium citrate     vancomycin 1,000 mg (07/26/22 1743)    aspirin EC  81 mg Oral Daily   atorvastatin  40 mg Oral Daily   cinacalcet  90 mg Oral Q M,W,F-HD   darbepoetin (ARANESP) injection - DIALYSIS  25 mcg Subcutaneous Q Mon-1800   feeding supplement (NEPRO CARB STEADY)  237 mL Oral BID BM   heparin injection (subcutaneous)  5,000 Units Subcutaneous Q8H   heparin sodium (porcine)       isosorbide-hydrALAZINE  1 tablet Oral TID   lidocaine  1 patch Transdermal Q24H   metoprolol tartrate  12.5 mg Oral BID    pantoprazole  40 mg Oral Daily   polyethylene glycol  17 g Oral Daily   sucroferric oxyhydroxide  500 mg Oral TID WC

## 2022-07-29 NOTE — Progress Notes (Signed)
   07/29/22 1252  Vitals  Temp 97.7 F (36.5 C)  Pulse Rate 91  Resp 20  BP (!) 102/55  SpO2 100 %  O2 Device Room Air  Weight 90.2 kg  Type of Weight Post-Dialysis  Oxygen Therapy  Patient Activity (if Appropriate) In bed  Pulse Oximetry Type Continuous  Oximetry Probe Site Changed No  Post Treatment  Dialyzer Clearance Lightly streaked  Duration of HD Treatment -hour(s) 300 hour(s)  Hemodialysis Intake (mL) 0 mL  Liters Processed 93.5  Fluid Removed (mL) 2200 mL  Tolerated HD Treatment Yes  Post-Hemodialysis Comments ekg complete----NSR  AVG/AVF Arterial Site Held (minutes) 10 minutes  AVG/AVF Venous Site Held (minutes) 10 minutes   Received patient in bed to unit.  Alert and oriented.  Informed consent signed and in chart.   [facility] [facility] duration:4.0  Patient tolerated well.  Transported back to the room  Alert, without acute distress.  Hand-off given to patient's nurse.   Access used: LUAF Access issues: no complications  Total UF removed: 2200 Medication(s) given: none   [facility]

## 2022-07-30 ENCOUNTER — Inpatient Hospital Stay (HOSPITAL_COMMUNITY): Payer: 59

## 2022-07-30 DIAGNOSIS — I330 Acute and subacute infective endocarditis: Secondary | ICD-10-CM | POA: Diagnosis not present

## 2022-07-30 MED ORDER — ISOSORB DINITRATE-HYDRALAZINE 20-37.5 MG PO TABS
1.0000 | ORAL_TABLET | Freq: Two times a day (BID) | ORAL | Status: DC
  Administered 2022-07-31 – 2022-08-03 (×5): 1 via ORAL
  Filled 2022-07-30 (×6): qty 1

## 2022-07-30 NOTE — Progress Notes (Signed)
Inpatient Rehab Coordinator Note:  I spoke with patient's daughter, Robyne Askew, over the phone to discuss CIR recommendations and goals/expectations of CIR stay.  We reviewed 3 hrs/day of therapy, physician follow up, and average length of stay 2 weeks (dependent upon progress) with goals of supervision to min assist.  [place] plans on taking FMLA to assist pt at discharge.  We discussed need for insurance approval and I will start that process today, once therapy has updated notes.  Will follow.   Shann Medal, PT, DPT Admissions Coordinator [phone] 07/30/22  12:51 PM

## 2022-07-30 NOTE — Progress Notes (Signed)
Daily Progress Note Intern Pager: [phone]  Patient name: [name] Medical record number: [id number] Date of birth: November 08, 1960 Age: 62 y.o. Gender: female  Primary Care Provider: Lowry Ram, MD Consultants: Cardiology, Nephrology, ID, Neurology, CTS  Code Status: Full  Pt Overview and Major Events to Date:  3/20 Admitted 3/21 [place] 3/22 TEE 3/23 CT coronary morph, CODE STROKE  Assessment and Plan: Ms. Fehler is a 62 year old woman with PMH of ESRD on HD MWF, HTN, T2DM, CVA, paroxysmal VT, HLD, GERD currently admitted for suspected acute bacterial endocarditis, initially presenting with pneumonia and NSTEMI.   * Acute bacterial endocarditis VSS. Afebrile. Cardiology recommends FDG PET CT to distinguish degenerative disease vs inflammation, however this scan is not available at [facility]. NGTD repeat Bcx. NSTEMI resolved but high risk for repeat cardiac event. - ID consulted, recs appreciated - Cardiology consulted, appreciate recs - CTS consulted, recs appreciated - not a surgical candidate - Continue Vancomycin 1000 mg MWF, day 7 - Repeat Bcx NGTD  Stroke due to embolism ([place]) Neuro remarkably improved today. - Appreciate ongoing care by stroke team - Cleared by SLP - Aspirin, subQ heparin - Neuro checks   Localized swelling of chest wall Uncertain chronicity. Chest xray no acute bony abnormality. No sign of erythema or drainage.  -Continue to monitor -Consider mandibular chest x ray view -Consider soft tissue US -ID recommended MR Sternum, unable to obtain, will obtain CT today  Multifocal pneumonia Respiratory status stable. - ID consulted, appreciate recommendations - Completed empiric antibiotic course - Continue to follow original and repeat blood cultures    ESRD on dialysis [facility]) HD on MWF. Renal function at baseline reassuringly despite repeat contrast. -Nephrology consulted recs, appreciated -Electrolyte management per dialysis  -Velphoro,  Sensipar       FEN/GI: Heart healthy, carb modified PPx: ASA, SubQ Heparin Dispo:CIR  Subjective:  NAEO. States she is doing better then yesterday. Still has reproducible chest pain. Chronic abdominal pain.  Objective: Temp:  [97.2 F (36.2 C)-98.2 F (36.8 C)] 97.8 F (36.6 C) (03/26 1123) Pulse Rate:  [86-96] 86 (03/26 1123) Resp:  [15-23] 17 (03/26 1123) BP: (86-109)/(38-70) 99/70 (03/26 1123) SpO2:  [100 %] 100 % (03/26 1123) Weight:  [90.2 kg] 90.2 kg (03/25 1252) Physical Exam: General: NAD  Neruo: A&O, strength 5/5 bilateral upper extremities, smile symmetric Cardiovascular: RRR, no murmurs, no peripheral edema, sternal edema present, no erythematous, mildly tender to palpation Respiratory: normal WOB on RA, CTAB, no wheezes, ronchi or rales Abdomen: soft, NTTP, no rebound or guarding Extremities: Moving all 4 extremities equally   Laboratory: Most recent CBC Lab Results  Component Value Date   WBC 9.6 07/29/2022   HGB 10.4 (L) 07/29/2022   HCT 31.2 (L) 07/29/2022   MCV 85.7 07/29/2022   PLT 379 07/29/2022   Most recent BMP    Latest Ref Rng & Units 07/29/2022   12:27 AM  BMP  Glucose 70 - 99 mg/dL 89   BUN 8 - 23 mg/dL 45   Creatinine 0.44 - 1.00 mg/dL 10.28   Sodium 135 - 145 mmol/L 129   Potassium 3.5 - 5.1 mmol/L 3.7   Chloride 98 - 111 mmol/L 90   CO2 22 - 32 mmol/L 24   Calcium 8.9 - 10.3 mg/dL 8.8     Other pertinent labs: Troponin 252   Imaging/Diagnostic Tests: No new imaging.  Salvadore Oxford, MD 07/30/2022, 12:35 PM  PGY-1, [place] Intern pager: [phone],  text pages welcome Secure chat group [place]

## 2022-07-30 NOTE — Discharge Instructions (Addendum)
Dear [name],  Thank you for letting us participate in your care. You were hospitalized for altered mental status and diagnosed with Acute bacterial endocarditis. Bacterial endocarditis is an infection within your heart. This can sometimes break off and go to other places. This is what caused you to later have a stroke. You were treated with antibiotics. You were treated with Aspirin for the stroke.    You will continue to get antibiotics with dialysis through May. Please make sure that you continue to follow up with Nephrology. There were some findings on imagining that were found while you were in the hospital that will require follow up imaging (like a CT). One was a small lung nodule and the other was a renal artery aneurysm. These are both small but recommended to get follow up imaging in 1-2 years.   POST-HOSPITAL & CARE INSTRUCTIONS Please continue taking your medications as directed below. Please make sure to follow-up with Cardiology, Neurology, Infectious Disease. Go to your follow up appointments (listed below)   DOCTOR'S APPOINTMENT   No future appointments.  Follow-up Information     [facility]              Follow up.   Contact information: [address] [place] [phone]        [facility]. Schedule an appointment as soon as possible for a visit.   Specialty: Cardiology Contact information: [address] [place] [id number] [place] [phone]                Take care and be well!  [facility]  [place], [place] [address] [phone]

## 2022-07-30 NOTE — Progress Notes (Signed)
Physical Therapy Treatment Patient Details Name: [name] MRN: [id number] DOB: [birth year] Today's Date: 07/30/2022   History of Present Illness 62 y.o. female presents to [facility] hospital on 07/23/2022 with chest pain after being found down at home, found to have NSTEMI and PNA. Code stroke called 3/23 and MRI revealed multiple scattered subcentimeter acute ischemic infarcts  involving the bilateral cerebral hemispheres and pons.   PMH includes DM, ESRD, GERD, HLD, HTN.    PT Comments    Pt with fair tolerance to treatment today. Co treat with OT. Pt was able to sit EOB however required +2 total A for all mobility due to heavy posterior and L lateral lean. Attempted 1 sit to stand however pt was unable to complete requiring +2 total A to scoot up in bed. Majority of session focused on pericare due to pt bowel incontinence. Despite this, I believe that pt remains an excellent candidate for AIR as pt can tolerate 3 hour of therapy a day, has adequate support at home, and PLOF was independent with no AD. PT will continue to follow.   Recommendations for follow up therapy are one component of a multi-disciplinary discharge planning process, led by the attending physician.  Recommendations may be updated based on patient status, additional functional criteria and insurance authorization.  Follow Up Recommendations  Can patient physically be transported by private vehicle: No    Assistance Recommended at Discharge Frequent or constant Supervision/Assistance  Patient can return home with the following Two people to help with walking and/or transfers;Two people to help with bathing/dressing/bathroom;Assistance with cooking/housework;Assistance with feeding;Direct supervision/assist for medications management;Direct supervision/assist for financial management;Assist for transportation;Help with stairs or ramp for entrance   Equipment Recommendations  Wheelchair (measurements PT);Wheelchair cushion  (measurements PT);BSC/3in1    Recommendations for Other Services       Precautions / Restrictions Precautions Precautions: Fall Restrictions Weight Bearing Restrictions: No     Mobility  Bed Mobility Overal bed mobility: Needs Assistance Bed Mobility: Supine to Sit, Sit to Supine, Rolling Rolling: +2 for physical assistance, Max assist   Supine to sit: +2 for physical assistance, Total assist Sit to supine: +2 for physical assistance, Total assist   General bed mobility comments: Use of bed pad to scoot hips to EOB. Pt required assistance for BLE management. Pt demonstrated heavy L lateral lean requiring total assist to correct.    Transfers                   General transfer comment: Attempted 1 sit to stand however pt was unable lift up more than a few inches off bed with +2 Total A.    Ambulation/Gait                   Stairs             Wheelchair Mobility    Modified Rankin (Stroke Patients Only)       Balance Overall balance assessment: Needs assistance Sitting-balance support: Feet supported Sitting balance-Leahy Scale: Poor Sitting balance - Comments: posterior and L lateral lean. Pt required +2 total A to correct. Postural control: Posterior lean, Left lateral lean                                  Cognition Arousal/Alertness: Awake/alert Behavior During Therapy: Flat affect Overall Cognitive Status: Impaired/Different from baseline Area of Impairment: Safety/judgement, Following commands, Attention, Problem solving  Current Attention Level: Selective   Following Commands: Follows one step commands with increased time Safety/Judgement: Decreased awareness of deficits, Decreased awareness of safety Awareness: Intellectual Problem Solving: Slow processing, Decreased initiation, Difficulty sequencing, Requires verbal cues, Requires tactile cues General Comments: Pt was oriented to self, DOB,  and location however required increased time and multiple verbal and tactile cues for following commands.        Exercises      General Comments General comments (skin integrity, edema, etc.): BP: 106/67 supine. BP: 92/68 supine at end of session.      Pertinent Vitals/Pain Pain Assessment Pain Assessment: No/denies pain    Home Living                          Prior Function            PT Goals (current goals can now be found in the care plan section) Progress towards PT goals: Progressing toward goals    Frequency    Min 1X/week      PT Plan Current plan remains appropriate    Co-evaluation PT/OT/SLP Co-Evaluation/Treatment: Yes Reason for Co-Treatment: Complexity of the patient's impairments (multi-system involvement);For patient/therapist safety;To address functional/ADL transfers PT goals addressed during session: Mobility/safety with mobility OT goals addressed during session: ADL's and self-care;Strengthening/ROM      AM-PAC PT "6 Clicks" Mobility   Outcome Measure  Help needed turning from your back to your side while in a flat bed without using bedrails?: Total Help needed moving from lying on your back to sitting on the side of a flat bed without using bedrails?: Total Help needed moving to and from a bed to a chair (including a wheelchair)?: Total Help needed standing up from a chair using your arms (e.g., wheelchair or bedside chair)?: Total Help needed to walk in hospital room?: Total Help needed climbing 3-5 steps with a railing? : Total 6 Click Score: 6    End of Session   Activity Tolerance: Patient limited by fatigue Patient left: in bed;with call bell/phone within reach;with bed alarm set Nurse Communication: Mobility status PT Visit Diagnosis: Other abnormalities of gait and mobility (R26.89);Muscle weakness (generalized) (M62.81)     Time: [id number] PT Time Calculation (min) (ACUTE ONLY): 33 min  Charges:  $Therapeutic  Activity: 8-22 mins                     Shelby Mattocks, PT, DPT Acute Rehab Services [id number]    Viann Shove 07/30/2022, 3:49 PM

## 2022-07-30 NOTE — Progress Notes (Signed)
Rounding Note    Patient Name: [name] Date of Encounter: 07/30/2022  [facility] Cardiologist: Dr. Casandra Doffing  Subjective   Patient more communicative today.  She still complains of chest pain which is more pain to palpation.  She also complained of some abdominal pain overnight.  Inpatient Medications    Scheduled Meds:  aspirin EC  81 mg Oral Daily   atorvastatin  40 mg Oral Daily   cinacalcet  90 mg Oral Q M,W,F-HD   darbepoetin (ARANESP) injection - DIALYSIS  25 mcg Subcutaneous Q Mon-1800   feeding supplement (NEPRO CARB STEADY)  237 mL Oral BID BM   heparin injection (subcutaneous)  5,000 Units Subcutaneous Q8H   isosorbide-hydrALAZINE  1 tablet Oral BID   lidocaine  1 patch Transdermal Q24H   metoprolol tartrate  12.5 mg Oral BID   pantoprazole  40 mg Oral Daily   polyethylene glycol  17 g Oral Daily   sucroferric oxyhydroxide  500 mg Oral TID WC   Continuous Infusions:  vancomycin 1,000 mg (07/29/22 1335)   PRN Meds: acetaminophen, calcium carbonate, capsaicin, lidocaine, nitroGLYCERIN   Vital Signs    Vitals:   07/30/22 0000 07/30/22 0340 07/30/22 0742 07/30/22 0833  BP: (!) 100/58 (!) 98/58 93/61 (!) 86/55  Pulse: 94 87 91 87  Resp: (!) 23 20 15 18   Temp: 98.2 F (36.8 C) 98 F (36.7 C)  97.8 F (36.6 C)  TempSrc: Oral Oral Oral Oral  SpO2: 100% 100% 100% 100%  Weight:      Height:        Intake/Output Summary (Last 24 hours) at 07/30/2022 0920 Last data filed at 07/29/2022 1252 Gross per 24 hour  Intake --  Output 2200 ml  Net -2200 ml      07/29/2022   12:52 PM 07/29/2022    7:57 AM 07/29/2022    4:20 AM  Last 3 Weights  Weight (lbs) 198 lb 13.7 oz 206 lb 12.7 oz 207 lb 0.2 oz  Weight (kg) 90.2 kg 93.8 kg 93.9 kg      Telemetry    Sinus rhythm with occasional PVCs- Personally Reviewed  ECG    Not performed today, EKG performed yesterday showed sinus rhythm at 92 with LVH and repull changes.- Personally  Reviewed  Physical Exam   GEN: No acute distress.   Neck: No JVD Cardiac: RRR, no murmurs, rubs, or gallops.,  There was pain to palpation of her anterior chest Respiratory: Clear to auscultation bilaterally. GI: Soft, slightly tender to palpation, non-distended, active bowel sounds MS: No edema; No deformity. Neuro:  Nonfocal  Psych: Normal affect   Labs    High Sensitivity Troponin:   Recent Labs  Lab 07/24/22 0733 07/24/22 1221 07/24/22 1502 07/29/22 1342 07/29/22 1512  TROPONINIHS 6,242* 4,206* 4,276* 292* 252*     Chemistry Recent Labs  Lab 07/23/22 2244 07/23/22 2252 07/24/22 0120 07/25/22 0158 07/26/22 0244 07/27/22 1505 07/28/22 0245 07/29/22 0027  NA 137   < > 139 136   < > 130* 132* 129*  K 4.4   < > 4.9 4.0   < > 3.8 3.7 3.7  CL 94*   < > 96* 96*   < > 91* 92* 90*  CO2 21*  --  23 23   < > 24 26 24   GLUCOSE 136*   < > 134* 70   < > 164* 83 89  BUN 64*   < > 64* 38*   < >  34* 36* 45*  CREATININE 13.99*   < > 14.02* 8.52*   < > 7.63* 8.57* 10.28*  CALCIUM 9.0  --  8.8* 8.4*   < > 8.0* 8.4* 8.8*  MG 2.6*  --   --   --   --   --   --   --   PROT 7.5  --  7.0 6.6  --   --   --   --   ALBUMIN 3.2*  --  3.0* 2.7*  --  2.8* 2.8* 2.9*  AST 44*  --  48* 47*  --   --   --   --   ALT 19  --  17 20  --   --   --   --   ALKPHOS 57  --  51 49  --   --   --   --   BILITOT 0.8  --  0.6 0.9  --   --   --   --   GFRNONAA 3*  --  3* 5*   < > 6* 5* 4*  ANIONGAP 22*  --  20* 17*   < > 15 14 15    < > = values in this interval not displayed.    Lipids  Recent Labs  Lab 07/25/22 0158  CHOL 198  TRIG 133  HDL 38*  LDLCALC 133*  CHOLHDL 5.2    Hematology Recent Labs  Lab 07/27/22 0126 07/28/22 0245 07/29/22 0027  WBC 8.1 7.6 9.6  RBC 3.94 3.66* 3.64*  HGB 11.1* 10.2* 10.4*  HCT 33.8* 31.9* 31.2*  MCV 85.8 87.2 85.7  MCH 28.2 27.9 28.6  MCHC 32.8 32.0 33.3  RDW 14.6 14.8 15.0  PLT 296 327 379   Thyroid No results for input(s): "TSH", "FREET4" in the  last 168 hours.  BNPNo results for input(s): "BNP", "PROBNP" in the last 168 hours.  DDimer No results for input(s): "DDIMER" in the last 168 hours.   Radiology    CT HEAD WO CONTRAST (5MM)  Result Date: 07/28/2022 CLINICAL DATA:  62 year old female code stroke presentation yesterday. Numerous scattered small embolic appearing infarcts on brain MRI. EXAM: CT HEAD WITHOUT CONTRAST TECHNIQUE: Contiguous axial images were obtained from the base of the skull through the vertex without intravenous contrast. RADIATION DOSE REDUCTION: This exam was performed according to the departmental dose-optimization program which includes automated exposure control, adjustment of the mA and/or kV according to patient size and/or use of iterative reconstruction technique. COMPARISON:  Head CT, CTA head and neck, cardiac CTA, and brain MRI yesterday. FINDINGS: Brain: Intravascular contrast persists. This limits evaluation for hyperdense intracranial hemorrhage. No midline shift or intracranial mass effect. No ventriculomegaly. No IVH is evident. Confluent bilateral cerebral white matter hypodensity redemonstrated. The scattered small gray and white matter infarcts demonstrated by MRI yesterday remain largely occult by CT. No superimposed major vascular territory infarct identified. Vascular: Abundant intravascular contrast persists, query renal insufficiency if no repeat contrast dose since yesterday. The major intracranial vascular structures are enhancing as expected. Skull: No acute osseous abnormality identified. Sinuses/Orbits: Small sinus fluid levels, scattered bubbly opacity and mucosal thickening has not significantly changed. Tympanic cavities and mastoids remain well aerated. Other: No acute orbit or scalp soft tissue finding. IMPRESSION: 1. Abundant intravascular contrast persists, query Renal Insufficiency if no repeat iodinated contrast dose since yesterday. 2. Small embolic cerebral infarcts remain occult by CT.  No intracranial mass effect. No definite acute intracranial hemorrhage. 3. Chronic white matter disease. 4.  Paranasal sinus inflammation. Electronically Signed   By: Genevie Ann M.D.   On: 07/28/2022 09:53    Cardiac Studies   Transesophageal echo (07/26/2022)  IMPRESSIONS     1. Left ventricular ejection fraction, by estimation, is 60 to 65%. The  left ventricle has normal function. The left ventricle has no regional  wall motion abnormalities. There is moderate concentric left ventricular  hypertrophy.   2. Right ventricular systolic function is normal. The right ventricular  size is normal.   3. Left atrial size was mildly dilated. No left atrial/left atrial  appendage thrombus was detected. The LAA emptying velocity was 70 cm/s.   4. There is a mobile mass in the right atrium (described under tricuspid  valve).   5. There is exuberant mitral annulus calcification, up to 2 cm in  thickness. There is central hypoechogenicity. This could be due to caseous  necrosis or annular abscess. In the medial posterior annulus  (corresponding to the medial scallop (P3), the MAC  appears unusually mobile, suggesting tissue undermining. There is no clear  evidence of communication between the left heart chambers and the mitral  annulus cavity. The mitral valve is degenerative. Mild mitral valve  regurgitation. Severe mitral annular  calcification.   6. There is a highly mobile mass attached to the right atrial wall at the  junction of the tricuspid annulus and the aortic annulus, at the  right-most segment of the right fibrous trigone. It measures 11 mm in  length and 3 mm in thickness and is highly  suggestive of vegetation. The tricuspid valve is abnormal. Tricuspid valve  regurgitation is mild to moderate.   7. There is a tiny filamentous mobile echodensity on the aortic  noncoronary cusp. This is most likely a Lambl's excrescence, although a  vegetation cannot be completely excluded. The large  vegetation described  on the transthoracic echo is actually  attached to the tricuspid annulus and is located in the right atrium.  There is no clear evidence of aortic annulus abscess. The aortic valve is  normal in structure. Aortic valve regurgitation is not visualized.   8. There is Moderate (Grade III) layered plaque involving the aortic arch  and descending aorta.   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   Cardiac catheterization (07/25/2022)  gnostic Dominance: Right    Coronary CTA (07/27/2022)   Cardiac CT   TECHNIQUE: The patient was scanned on a Siemens Force [id number] slice scanner. A 120 kV retrospective scan was triggered in the ascending thoracic aorta at 140 HU's. Gantry rotation speed was 250 msecs and collimation was .6 mm. No beta blockade or nitro were given. The 3D data set was reconstructed in 5% intervals of the R-R cycle. Systolic and diastolic phases were analyzed on a dedicated work station using MPR, MIP and VRT modes. The patient received 80 cc of contrast.   FINDINGS: Mild bi atrial enlargement No ASD/VSD/PFO. No pericardial effusion Normal ascending thoracic aorta 2.9 cm Moderate calcific atherosclerosis with normal arch vessels   AV: Tri leaflet No vegetation noted There is a small area of apparent contrast enhancement from non coronary sinus to the RA Cannot r/o fistula but  correlates with mobile mass seen on TEE measuring 7.7 mm x 4.1 mm   Non coronary sinus: 2.85 cm   Right coronary sinus: 2.84 cm   Left coronary sinus: 2.74 cm   MV: Markedly  abnormal posterior annulus. Mass on ventricular side of leaflet measuring 1.9 cm x 1.5 cm partially calcified but mixed tissue density with HU's 150 to 250 Apears to be some contrast enhancement The posterior leaflet has restricted motion with entire leaflet being thickened 1.2 cm x 2.9 cm The overall appearance appears more to be and annular abscess rather than caseating mitral annular calcification The mass extends posteriorly toward the coronary sinus   The non contrast images show calcified annular edges through out the mitral annulus with edge HU's as high as 650 The core area inside the MAC measures only 60-90 HU's   TV: Not well seen with contrast wash out in RV   PV: Not well seen with contrast wash out in RVOT   Coronary Arteries: 1-24% calcified plaque in proximal and mid vessel 25-49% calcified plaque in PDA/PLB LM is normal LAD with 25-40% calcified plaque in proximal and mid vessel Normal diagonal and IM branches LCX 1024% soft plaque proximally and 1024% calcified plaque distally .   IMPRESSION: 1. CAD RADS 2 non obstructive CAD see description above study done without nitro or beta blocker   2. 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 Contrast enhancement and large central area of mixed HU's cannot really tell if severe caseating MAC vs annular abscess but latter may be more likely FDG PET CT would likely be best distinguishing test between degenerative disease vs infection/inflammation   3. Contrast enhancing small mass at base of septal TV leaflet near non coronary aortic annulus   [place] Patient Profile     62 y.o. female with a hx of HTN, HLD, DM 2, CVA, ESRD on HD (MWF) who was seen 07/24/2022 for the evaluation of acute acute coronary syndrome at the request of ED.   Assessment & Plan    1: Non-STEMI-troponins rose to 6200.  Cardiac catheterization showed no obstructive CAD suggesting stress cardiomyopathy.  2: Stress  cardiomyopathy-EF initially 35 to 60% but normal in follow-up.  3: End-stage renal disease-on hemodialysis Monday/Wednesday/Friday  4: Pneumonia-multifocal on chest x-ray.  On antibiotics followed by the primary service.  5: Hyperlipidemia-LDL 133 on atorvastatin 40 recently started.  6: SBE-complex situation on coronary CTA and TEE.  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  She was evaluated by Dr. Cyndia Bent who did not think she was a surgical candidate given her severe comorbidities and extensiveness of her disease.  He recommended continued antibiotic therapy.  7: Stroke-patient had multifocal changes on her MRI suggesting embolic event.  She was minimally communicative several days ago but is communicative today.  Being seen by the stroke service.  Agree with antiplatelet therapy and antibiotic therapy for SBE at this time.    At this point, patient being considered for transfer to inpatient rehab by the primary service.  Nothing further to offer from a cardiac standpoint given her clean coronary arteries and normal LV function.  Antibiotic therapy per the infectious disease service.  Will be happy to see her back as an outpatient and we will arrange.  Our service will sign off for now.  For questions or updates, please contact [place] Please consult [email or web address] for contact info under        Signed, Quay Burow, MD  07/30/2022, 9:20 AM

## 2022-07-30 NOTE — Progress Notes (Addendum)
Occupational Therapy Treatment Patient Details Name: [name] MRN: [id number] DOB: Jan 11, 1961 Today's Date: 07/30/2022   History of present illness 62 y.o. female presents to [facility] hospital on 07/23/2022 with chest pain after being found down at home, found to have NSTEMI and PNA. Code stroke called 3/23 and MRI revealed multiple scattered subcentimeter acute ischemic infarcts  involving the bilateral cerebral hemispheres and pons.   PMH includes DM, ESRD, GERD, HLD, HTN.   OT comments  Pt with slow progression toward goals this session, mod-total A for ADLs, max-total A +2 for bed mobility and x1 standing attempt at EOB, unable to clear hips. Pt with L lateral and forward lean this session, needing mod A to sit EOB. Pt with soft BP once returned to supine and episode of bowel incontinence. Pt able to complete LUE therex seated in bed-chair position, provided with squeeze ball and pt able to demo use. Pt improving with problem solving/awareness, when asked "what day is it?" Pt locates calendar in room to read day of the week. Pt presenting with impairments listed below, however very motivated to progress, will follow acutely. Continue to recommend inpatient intensive rehab (3 hrs/day) to maximize safety and independence with ADLs prior to d/c home.   Recommendations for follow up therapy are one component of a multi-disciplinary discharge planning process, led by the attending physician.  Recommendations may be updated based on patient status, additional functional criteria and insurance authorization.    Assistance Recommended at Discharge Frequent or constant Supervision/Assistance  Patient can return home with the following  A lot of help with bathing/dressing/bathroom;Assist for transportation;Direct supervision/assist for medications management;Assistance with cooking/housework;Help with stairs or ramp for entrance;Two people to help with walking and/or transfers;Direct supervision/assist for  financial management   Equipment Recommendations  Other (comment) (defer)    Recommendations for Other Services PT consult;Rehab consult    Precautions / Restrictions Precautions Precautions: Fall Precaution Comments: watch BP Restrictions Weight Bearing Restrictions: No       Mobility Bed Mobility Overal bed mobility: Needs Assistance Bed Mobility: Supine to Sit, Sit to Supine, Rolling Rolling: +2 for physical assistance, Max assist   Supine to sit: +2 for physical assistance, Total assist Sit to supine: +2 for physical assistance, Total assist   General bed mobility comments: pt with heavy lean to L this session and forward lean, cues to keep head upright    Transfers                   General transfer comment: attempted x1 but unable to clear pt's hips from EOB     Balance Overall balance assessment: Needs assistance Sitting-balance support: Feet supported Sitting balance-Leahy Scale: Poor Sitting balance - Comments: posterior and L lateral lean. Pt required +2 total A to correct. Postural control: Posterior lean, Left lateral lean                                 ADL either performed or assessed with clinical judgement   ADL Overall ADL's : Needs assistance/impaired                 Upper Body Dressing : Bed level;Moderate assistance Upper Body Dressing Details (indicate cue type and reason): donning clean gown while in bed-chair position     Toilet Transfer: Maximal assistance;Total assistance;+2 for physical assistance   Toileting- Clothing Manipulation and Hygiene: Total assistance Toileting - Clothing Manipulation Details (indicate cue type  and reason): incontinent     Functional mobility during ADLs: Maximal assistance;Total assistance;+2 for physical assistance      Extremity/Trunk Assessment Upper Extremity Assessment LUE Deficits / Details: proximal vs distal weakness, ataxic-like movement with shoulder flexion, can  hold arm up against gravity, can flex/ext elbow , grasp 3/5. manually facilitates movement of LUE with RUE unless cued LUE Coordination: decreased gross motor;decreased fine motor   Lower Extremity Assessment Lower Extremity Assessment: Defer to PT evaluation        Vision   Additional Comments: gaze preference R >L   Perception Perception Perception: Not tested   Praxis Praxis Praxis: Impaired Praxis Impairment Details: Motor planning;Initiation Praxis-Other Comments: difficulty planning movements LUE when asked to touch her nose; tactile cues needed for initiation    Cognition Arousal/Alertness: Awake/alert Behavior During Therapy: Flat affect Overall Cognitive Status: Impaired/Different from baseline Area of Impairment: Safety/judgement, Following commands, Attention, Problem solving                   Current Attention Level: Selective   Following Commands: Follows one step commands with increased time Safety/Judgement: Decreased awareness of deficits, Decreased awareness of safety Awareness: Intellectual Problem Solving: Slow processing, Decreased initiation, Difficulty sequencing, Requires verbal cues, Requires tactile cues General Comments: pt oritented to self and place, states she is at a hospital in [place], improved problem solving skills, when asked what day it was, pt turns to calendar in room and states "tuesday"        Exercises Exercises: Other exercises Other Exercises Other Exercises: AAROM shoulder flexion LUE x10 Other Exercises: AAROM elbow flex/ext x10 LUE Other Exercises: LUE squeeze ball x10    Shoulder Instructions       General Comments BP: 106/67 supine. BP: 92/68 supine at end of session.    Pertinent Vitals/ Pain       Pain Assessment Pain Assessment: No/denies pain  Home Living                                          Prior Functioning/Environment              Frequency  Min 2X/week         Progress Toward Goals  OT Goals(current goals can now be found in the care plan section)  Progress towards OT goals: Progressing toward goals  Acute Rehab OT Goals Patient Stated Goal: none stated OT Goal Formulation: With patient/family Time For Goal Achievement: 08/11/22 Potential to Achieve Goals: Good ADL Goals Pt Will Perform Grooming: bed level;sitting;with min guard assist Pt Will Perform Upper Body Dressing: with min assist;sitting;bed level Pt Will Perform Lower Body Dressing: with min assist;sit to/from stand;with min guard assist;sitting/lateral leans Pt Will Transfer to Toilet: with min guard assist;ambulating;bedside commode Pt Will Perform Toileting - Clothing Manipulation and hygiene: with min guard assist;with min assist;sitting/lateral leans;sit to/from stand Additional ADL Goal #1: pt wil complete bed mobility with min A in prep for ADLs  Plan Discharge plan remains appropriate;Frequency remains appropriate    Co-evaluation    PT/OT/SLP Co-Evaluation/Treatment: Yes Reason for Co-Treatment: To address functional/ADL transfers;For patient/therapist safety;Complexity of the patient's impairments (multi-system involvement) PT goals addressed during session: Mobility/safety with mobility OT goals addressed during session: ADL's and self-care;Strengthening/ROM      AM-PAC OT "6 Clicks" Daily Activity     Outcome Measure   Help from another person eating meals?: None  Help from another person taking care of personal grooming?: A Little Help from another person toileting, which includes using toliet, bedpan, or urinal?: A Lot Help from another person bathing (including washing, rinsing, drying)?: A Lot Help from another person to put on and taking off regular upper body clothing?: A Little Help from another person to put on and taking off regular lower body clothing?: A Lot 6 Click Score: 16    End of Session Equipment Utilized During Treatment: Gait belt  OT  Visit Diagnosis: Unsteadiness on feet (R26.81);Muscle weakness (generalized) (M62.81);History of falling (Z91.81);Other symptoms and signs involving cognitive function;Pain   Activity Tolerance Patient limited by fatigue   Patient Left in bed;with call bell/phone within reach;with bed alarm set (in bed-chair position)   Nurse Communication Mobility status        Time: [id number] OT Time Calculation (min): 35 min  Charges: OT General Charges $OT Visit: 1 Visit OT Treatments $Self Care/Home Management : 8-22 mins  Renaye Rakers, OTD, OTR/L SecureChat Preferred Acute Rehab [phone] - [place] 07/30/2022, 4:20 PM

## 2022-07-30 NOTE — Progress Notes (Signed)
Subjective: Patient more talkative today.  She is having some chest pain and shortness of breath but denies other complaints  Objective Vital signs in last 24 hours: Vitals:   07/30/22 0000 07/30/22 0340 07/30/22 0742 07/30/22 0833  BP: (!) 100/58 (!) 98/58 93/61 (!) 86/55  Pulse: 94 87 91 87  Resp: (!) 23 20 15 18   Temp: 98.2 F (36.8 C) 98 F (36.7 C)  97.8 F (36.6 C)  TempSrc: Oral Oral Oral Oral  SpO2: 100% 100% 100% 100%  Weight:      Height:       Weight change: -0.1 kg  Physical Exam: General: Alert elderly female NAD, minimally verbal Heart: normal rate Lungs: Bilateral chest rise with no increased work of breathing Abdomen: NABS soft NT ND Extremities: No pedal edema Dialysis Access: LUA AVF+ bruit   OP dialysis Orders: [facility]  4h  400/500   93kg   2/2 bath  LUA AVF  Heparin 4000+ 2057midrun - last HD 3/15, post wt was 92.9kg - venofer 100mg  tiw IV thru 3/25 - sensipar 90 mg po tiw - mircera 30 mcg IV q 2 wks, last 3/1, due 3/15 but held due to Hb 11.1     Problem/Plan: ESRD - on HD MWF. per regular schedule,  NSTEMI/Acute systolic HF/perivalvular abscess-cardiology evaluating ,elevated troponin's, getting IV heparin. Cardiac cath 3/21 with no CAD. Wall motion consistent with Takotsubo CM. TEE = concern for  perivalvular absces and right atrial wall mass /cardiac CTA, currently no plans for surgical intervention and treating with antibiotics. 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  No plans for surgical intervention Multifocal PNA - CXR w/ R sided infiltrates. On ABX.  HTN - BP in goal.  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  Phosphorus mildly  elevated.  Cont sensipar and binders.     07/30/2022,10:04 AM  LOS: 6 days   Labs: Basic Metabolic Panel: Recent Labs  Lab 07/27/22 1505 07/28/22 0245 07/29/22 0027  NA 130* 132* 129*  K 3.8 3.7 3.7  CL 91* 92* 90*  CO2 24 26 24   GLUCOSE 164* 83 89  BUN 34* 36* 45*  CREATININE 7.63* 8.57* 10.28*  CALCIUM 8.0* 8.4* 8.8*  PHOS 5.0* 5.6* 6.9*   Liver Function Tests: Recent Labs  Lab 07/23/22 2244 07/24/22 0120 07/25/22 0158 07/27/22 1505 07/28/22 0245 07/29/22 0027  AST 44* 48* 47*  --   --   --   ALT 19 17 20   --   --   --   ALKPHOS 57 51 49  --   --   --   BILITOT 0.8 0.6 0.9  --   --   --   PROT 7.5 7.0 6.6  --   --   --   ALBUMIN 3.2* 3.0* 2.7* 2.8* 2.8* 2.9*   No results for input(s): "LIPASE", "AMYLASE" in the last 168 hours. No results for input(s): "AMMONIA" in the last 168 hours. CBC: Recent Labs  Lab 07/23/22 2244 07/23/22 2252 07/26/22 0244 07/26/22 1414 07/27/22 0126 07/28/22 0245 07/29/22 0027  WBC 12.6*   < > 5.6 7.4 8.1 7.6 9.6  NEUTROABS 11.3*  --   --   --   --   --   --   HGB  12.1   < > 10.0* 10.6* 11.1* 10.2* 10.4*  HCT 36.9   < > 30.2* 33.1* 33.8* 31.9* 31.2*  MCV 86.0   < > 85.6 86.9 85.8 87.2 85.7  PLT 252   < > 249 260 296 327 379   < > = values in this interval not displayed.   Cardiac Enzymes: Recent Labs  Lab 07/23/22 2244  CKTOTAL 1,948*   CBG: Recent Labs  Lab 07/23/22 2239 07/26/22 0854 07/27/22 1052 07/28/22 0838  GLUCAP 137* 78 82 88    Medications:  vancomycin 1,000 mg (07/29/22 1335)    aspirin EC  81 mg Oral Daily   atorvastatin  40 mg Oral Daily   cinacalcet  90 mg Oral Q M,W,F-HD   darbepoetin (ARANESP) injection - DIALYSIS  25 mcg Subcutaneous Q Mon-1800   feeding supplement (NEPRO CARB STEADY)  237 mL Oral BID BM   heparin injection (subcutaneous)  5,000 Units Subcutaneous Q8H   isosorbide-hydrALAZINE  1 tablet Oral BID   lidocaine  1 patch Transdermal Q24H   metoprolol tartrate  12.5 mg Oral BID    pantoprazole  40 mg Oral Daily   polyethylene glycol  17 g Oral Daily   sucroferric oxyhydroxide  500 mg Oral TID WC

## 2022-07-30 NOTE — Progress Notes (Signed)
Speech Language Pathology Treatment: Cognitive-Linquistic  Patient Details Name: NIKINA HAYER MRN: [id number] DOB: 1961/02/09 Today's Date: 07/30/2022 Time: [id number] SLP Time Calculation (min) (ACUTE ONLY): 15 min  Assessment / Plan / Recommendation Clinical Impression  Patient seen by SLP for skilled treatment focused on cognitive function goals. Patient was much more interactive today and responses were not as delayed. She continues to require cues to redirect and is easily distracted by things such as TV. She initially stated month as "February" but then quickly corrected to "March". She recalled the upcoming holiday of Easter, recalled the year and when asked day of week she said, "tomorrow's Wednesday so......" She was unable to then deduce today's date without SLP cues. Patient recalled therapy session but only able to state she worked on "arms". She continues to require extra time to process, cues to direct and redirect attention but she is improving in quickness of responses, accuracy with orientation questions and basic level recall. SLP will continue to follow.   HPI HPI: Patient is a 62 y.o. female with PMH: GERD, HTN, DM, ESRD on HD, HLD. She presented to the hospital from home on 07/24/22 with chest pain after being found down at home following an unwittnessed fall. In ED, she was found to have NSTEMI and multifocal PNA. She later met sepsis criteria. On 3/23, Code Stroke was called while patient in unit 6E with c/o aphasia, left sided weakness. CT head did not show acute intracranial process but evaluation was limited by contrast in vasculature; MRI brain ordered but has not yet been completed.      SLP Plan  Continue with current plan of care      Recommendations for follow up therapy are one component of a multi-disciplinary discharge planning process, led by the attending physician.  Recommendations may be updated based on patient status, additional functional criteria and  insurance authorization.    Recommendations                         Frequent or constant Supervision/Assistance Cognitive communication deficit (R41.841)     Continue with current plan of care     Sonia Baller, MA, CCC-SLP Speech Therapy

## 2022-07-31 DIAGNOSIS — I330 Acute and subacute infective endocarditis: Secondary | ICD-10-CM | POA: Diagnosis not present

## 2022-07-31 LAB — CBC
HCT: 37.2 % (ref 36.0–46.0)
Hemoglobin: 11.7 g/dL — ABNORMAL LOW (ref 12.0–15.0)
MCH: 27.9 pg (ref 26.0–34.0)
MCHC: 31.5 g/dL (ref 30.0–36.0)
MCV: 88.8 fL (ref 80.0–100.0)
Platelets: 456 10*3/uL — ABNORMAL HIGH (ref 150–400)
RBC: 4.19 MIL/uL (ref 3.87–5.11)
RDW: 15.4 % (ref 11.5–15.5)
WBC: 14.8 10*3/uL — ABNORMAL HIGH (ref 4.0–10.5)
nRBC: 0 % (ref 0.0–0.2)

## 2022-07-31 LAB — CULTURE, BLOOD (ROUTINE X 2)
Culture: NO GROWTH
Culture: NO GROWTH
Special Requests: ADEQUATE
Special Requests: ADEQUATE

## 2022-07-31 LAB — RENAL FUNCTION PANEL
Albumin: 3.4 g/dL — ABNORMAL LOW (ref 3.5–5.0)
Anion gap: 17 — ABNORMAL HIGH (ref 5–15)
BUN: 37 mg/dL — ABNORMAL HIGH (ref 8–23)
CO2: 26 mmol/L (ref 22–32)
Calcium: 9.3 mg/dL (ref 8.9–10.3)
Chloride: 91 mmol/L — ABNORMAL LOW (ref 98–111)
Creatinine, Ser: 9.04 mg/dL — ABNORMAL HIGH (ref 0.44–1.00)
GFR, Estimated: 5 mL/min — ABNORMAL LOW (ref >60)
Glucose, Bld: 86 mg/dL (ref 70–99)
Phosphorus: 6.2 mg/dL — ABNORMAL HIGH (ref 2.5–4.6)
Potassium: 4.4 mmol/L (ref 3.5–5.1)
Sodium: 134 mmol/L — ABNORMAL LOW (ref 135–145)

## 2022-07-31 LAB — VANCOMYCIN, RANDOM: Vancomycin Rm: 33 ug/mL

## 2022-07-31 MED ORDER — METOPROLOL TARTRATE 25 MG PO TABS: 12.5000 mg | ORAL_TABLET | Freq: Two times a day (BID) | ORAL | 0 refills | Status: DC

## 2022-07-31 MED ORDER — HEPARIN SODIUM (PORCINE) 1000 UNIT/ML IJ SOLN
INTRAMUSCULAR | Status: AC
  Administered 2022-07-31: 4000 [IU]
  Filled 2022-07-31: qty 2

## 2022-07-31 MED ORDER — CINACALCET HCL 30 MG PO TABS: 90.0000 mg | ORAL_TABLET | ORAL | Status: DC

## 2022-07-31 MED ORDER — CALCIUM CARBONATE ANTACID 500 MG PO CHEW: 1.0000 | CHEWABLE_TABLET | Freq: Three times a day (TID) | ORAL | Status: AC | PRN

## 2022-07-31 MED ORDER — ASPIRIN 81 MG PO TBEC: 81.0000 mg | DELAYED_RELEASE_TABLET | Freq: Every day | ORAL | 12 refills | Status: AC

## 2022-07-31 MED ORDER — VANCOMYCIN HCL IN DEXTROSE 1-5 GM/200ML-% IV SOLN
1000.0000 mg | INTRAVENOUS | Status: DC
  Administered 2022-08-02 – 2022-08-05 (×2): 1000 mg via INTRAVENOUS
  Filled 2022-07-31 (×4): qty 200

## 2022-07-31 MED ORDER — VANCOMYCIN HCL IN DEXTROSE 1-5 GM/200ML-% IV SOLN: 1000.0000 mg | INTRAVENOUS | Status: DC

## 2022-07-31 MED ORDER — ISOSORB DINITRATE-HYDRALAZINE 20-37.5 MG PO TABS: 1.0000 | ORAL_TABLET | Freq: Two times a day (BID) | ORAL | 0 refills | Status: AC

## 2022-07-31 MED ORDER — VANCOMYCIN HCL 500 MG/100ML IV SOLN
500.0000 mg | Freq: Once | INTRAVENOUS | Status: AC
  Administered 2022-07-31: 500 mg via INTRAVENOUS
  Filled 2022-07-31: qty 100

## 2022-07-31 MED ORDER — HEPARIN SODIUM (PORCINE) 1000 UNIT/ML IJ SOLN
INTRAMUSCULAR | Status: AC
  Administered 2022-07-31: 2000 [IU]
  Filled 2022-07-31: qty 4

## 2022-07-31 MED ORDER — NITROGLYCERIN 0.4 MG SL SUBL: 0.4000 mg | SUBLINGUAL_TABLET | SUBLINGUAL | 0 refills | Status: AC | PRN

## 2022-07-31 MED ORDER — NEPRO/CARBSTEADY PO LIQD: 237.0000 mL | Freq: Two times a day (BID) | ORAL | 0 refills | Status: DC

## 2022-07-31 MED ORDER — DARBEPOETIN ALFA 25 MCG/0.42ML IJ SOSY: 25.0000 ug | PREFILLED_SYRINGE | INTRAMUSCULAR | Status: DC

## 2022-07-31 MED ORDER — ATORVASTATIN CALCIUM 40 MG PO TABS: 40.0000 mg | ORAL_TABLET | Freq: Every day | ORAL | 0 refills | Status: DC

## 2022-07-31 NOTE — Progress Notes (Signed)
Pharmacy Antibiotic Note  [name] is a 62 y.o. female admitted on 07/23/2022 with  unwitnessed fall, abd pain, multifocal pneumonia on CT scan .  Pharmacy has been consulted for vancomycin dosing.  Pt has ESRD with HD MWF, in HD currently.  Pre-HD level 33 mcg/ml. Expect post-HD level to ~18 mcg/ml.   Plan: Vancomycin 500 mg IV after HD today Vancomycin 1 g IV following each HD (MWF) F/U HD schedule and toleration Plan is for 6 weeks of antibiotics   Height: 5\' 10"  (177.8 cm) Weight: 90.2 kg (198 lb 13.7 oz) IBW/kg (Calculated) : 68.5  Temp (24hrs), Avg:97.8 F (36.6 C), Min:97.6 F (36.4 C), Max:98.1 F (36.7 C)  Recent Labs  Lab 07/26/22 0244 07/26/22 1414 07/27/22 0126 07/27/22 1505 07/28/22 0245 07/29/22 0027 07/31/22 0524  WBC 5.6 7.4 8.1  --  7.6 9.6  --   CREATININE 10.28*  --   --  7.63* 8.57* 10.28* 9.04*  VANCORANDOM  --   --   --   --   --   --  33     Estimated Creatinine Clearance: 7.8 mL/min (A) (by C-G formula based on SCr of 9.04 mg/dL (H)).    Allergies  Allergen Reactions   Lisinopril Anaphylaxis and Other (See Comments)    angioedema   Venofer  [Ferric Oxide]     Other reaction(s): Back Pain   Camellia Swelling and Other (See Comments)    Angioedema    Jardiance [Empagliflozin] Swelling and Rash    Antimicrobials this admission: Rocephin 3/19 >> 3/23 Azithro 3/19 >> 3/21 Vanc 3/20 >>  Dose adjustments this admission: 3/27 preHD lvl 33 - vanc 500 mg x1  Microbiology results: 3/20 BCx: 2/4 GPR-corynebacterium JK + e. Faecalis (amp sens) + Staph capitis 3/19 COVID, Flu, RSV neg 3/20 MRSA PCR neg 3/22 BCx: NGTD 3/23 BCID: staph species  Thank you for involving pharmacy in this patient's care.  Renold Genta, PharmD, BCPS Clinical Pharmacist Clinical phone for 07/31/2022 is [phone] 07/31/2022 9:17 AM

## 2022-07-31 NOTE — Progress Notes (Signed)
Subjective: Patient seen on dialysis today with no complaints  Objective Vital signs in last 24 hours: Vitals:   07/31/22 0830 07/31/22 0900 07/31/22 0930 07/31/22 1000  BP: 112/74 100/66 119/65 97/66  Pulse: 86 93 98 100  Resp: (!) 36 (!) 22 (!) 27 (!) 25  Temp:      TempSrc:      SpO2:  100%  100%  Weight:      Height:       Weight change:   Physical Exam: General: Alert elderly female NAD, minimally verbal Heart: normal rate Lungs: Bilateral chest rise with no increased work of breathing Abdomen: NABS soft NT ND Extremities: No pedal edema Dialysis Access: LUA AVF+ bruit   OP dialysis Orders: [facility]  4h  400/500   93kg   2/2 bath  LUA AVF  Heparin 4000+ 20107midrun - last HD 3/15, post wt was 92.9kg - venofer 100mg  tiw IV thru 3/25 - sensipar 90 mg po tiw - mircera 30 mcg IV q 2 wks, last 3/1, due 3/15 but held due to Hb 11.1     Problem/Plan: ESRD - on HD MWF. per regular schedule,  NSTEMI/Acute systolic HF/perivalvular abscess-cardiology evaluating ,elevated troponin's, getting IV heparin. Cardiac cath 3/21 with no CAD. Wall motion consistent with Takotsubo CM. TEE = concern for  perivalvular absces and right atrial wall mass /cardiac CTA, currently no plans for surgical intervention and treating with antibiotics. 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  No plans for surgical intervention Multifocal PNA - CXR w/ R sided infiltrates. On ABX.  HTN - BP in goal.  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  Phosphorus mildly elevated.  Cont sensipar and binders.     07/31/2022,10:20 AM  LOS: 7 days   Labs: Basic Metabolic Panel: Recent Labs  Lab 07/28/22 0245  07/29/22 0027 07/31/22 0524  NA 132* 129* 134*  K 3.7 3.7 4.4  CL 92* 90* 91*  CO2 26 24 26   GLUCOSE 83 89 86  BUN 36* 45* 37*  CREATININE 8.57* 10.28* 9.04*  CALCIUM 8.4* 8.8* 9.3  PHOS 5.6* 6.9* 6.2*   Liver Function Tests: Recent Labs  Lab 07/25/22 0158 07/27/22 1505 07/28/22 0245 07/29/22 0027 07/31/22 0524  AST 47*  --   --   --   --   ALT 20  --   --   --   --   ALKPHOS 49  --   --   --   --   BILITOT 0.9  --   --   --   --   PROT 6.6  --   --   --   --   ALBUMIN 2.7*   < > 2.8* 2.9* 3.4*   < > = values in this interval not displayed.   No results for input(s): "LIPASE", "AMYLASE" in the last 168 hours. No results for input(s): "AMMONIA" in the last 168 hours. CBC: Recent Labs  Lab 07/26/22 0244 07/26/22 1414 07/27/22 0126 07/28/22 0245 07/29/22 0027  WBC 5.6 7.4 8.1 7.6 9.6  HGB 10.0* 10.6* 11.1* 10.2* 10.4*  HCT 30.2* 33.1* 33.8* 31.9* 31.2*  MCV 85.6 86.9 85.8 87.2 85.7  PLT 249 260 296 327 379   Cardiac Enzymes: No results for input(s): "CKTOTAL", "  CKMB", "CKMBINDEX", "TROPONINI" in the last 168 hours.  CBG: Recent Labs  Lab 07/26/22 0854 07/27/22 1052 07/28/22 0838  GLUCAP 78 82 88    Medications:  [START ON 08/02/2022] vancomycin     vancomycin      aspirin EC  81 mg Oral Daily   atorvastatin  40 mg Oral Daily   cinacalcet  90 mg Oral Q M,W,F-HD   darbepoetin (ARANESP) injection - DIALYSIS  25 mcg Subcutaneous Q Mon-1800   feeding supplement (NEPRO CARB STEADY)  237 mL Oral BID BM   heparin injection (subcutaneous)  5,000 Units Subcutaneous Q8H   heparin sodium (porcine)       isosorbide-hydrALAZINE  1 tablet Oral BID   lidocaine  1 patch Transdermal Q24H   metoprolol tartrate  12.5 mg Oral BID   pantoprazole  40 mg Oral Daily   polyethylene glycol  17 g Oral Daily   sucroferric oxyhydroxide  500 mg Oral TID WC

## 2022-07-31 NOTE — Progress Notes (Signed)
Pt receives out-pt HD at [facility] on MWF. Will assist as needed.   Rock Sobol Renal Navigator [phone] 

## 2022-07-31 NOTE — Procedures (Signed)
HD Note:  Some information was entered later than the data was gathered due to patient care needs. The stated time with the data is accurate.  Received patient in bed to unit.  Alert and oriented.  Informed consent signed and in chart.   TX duration: 4  Patient's SBP was below the parameter set in the order of 110 at one point.  UF had to be stopped for the time frame to allow for recovery of the BP.  Patient was asymptomatic with lower BP.Patient did expectorate large volume of phlem at one time.  Cough congested  Transported back to the room  Alert, without acute distress.  Hand-off given to patient's nurse.   Access used: Left AVF Access issues: None  Total UF removed: 500 ml    [facility]

## 2022-07-31 NOTE — Progress Notes (Signed)
Daily Progress Note Intern Pager: [phone]  Patient name: [name] Medical record number: [id number] Date of birth: [birth year] Age: 62 y.o. Gender: female  Primary Care Provider: Lowry Ram, MD Consultants: Cardiology, Nephrology, ID, Neurology, CTS  Code Status: Full   Pt Overview and Major Events to Date:  3/20 Admitted 3/21 [place] 3/22 TEE 3/23 CT coronary morph, CODE STROKE  Assessment and Plan: Ms. Rackham is a 62 year old woman with PMH of ESRD on HD MWF, HTN, T2DM, CVA, paroxysmal VT, HLD, GERD currently admitted for suspected acute bacterial endocarditis, initially presenting with pneumonia and NSTEMI.   Medically Stable for discharge to CIR.  * Acute bacterial endocarditis VSS. Afebrile. Cardiology recommends FDG PET CT to distinguish degenerative disease vs inflammation, however this scan is not available at [facility]. NGTD repeat Bcx. NSTEMI resolved but high risk for repeat cardiac event. - ID consulted, recs appreciated - Cardiology consulted, appreciate recs - CTS consulted, recs appreciated - not a surgical candidate - Continue Vancomycin 1000 mg MWF, 05/01 - Repeat Bcx NGTD  Stroke due to embolism ([place]) Neuro status remarkably improved today. - Appreciate ongoing care by stroke team - Cleared by SLP - Aspirin, subQ heparin - Neuro checks   Localized swelling of chest wall Uncertain chronicity. Chest xray no acute bony abnormality. No sign of erythema or drainage. CT negative. -Continue to monitor -Consider mandibular chest x ray view -Consider soft tissue US  Multifocal pneumonia Respiratory status stable. - ID consulted, appreciate recommendations - Completed empiric antibiotic course - Continue to follow original and repeat blood cultures    ESRD on dialysis [facility]) HD on MWF. Renal function at baseline reassuringly despite repeat contrast. -Nephrology consulted recs, appreciated -Electrolyte management per dialysis  -Velphoro,  Sensipar    FEN/GI: Heart healthy, carb modified  PPx: ASA, SubQ Heparin  Dispo: CIR, pending insurance auth  Subjective:  NAEO. No new complaints.  Objective: Temp:  [97.6 F (36.4 C)-98.1 F (36.7 C)] 98.1 F (36.7 C) (03/27 0415) Pulse Rate:  [82-100] 99 (03/27 1221) Resp:  [17-36] 17 (03/27 1221) BP: (82-119)/(63-83) 82/71 (03/27 1221) SpO2:  [99 %-100 %] 100 % (03/27 1221) Physical Exam: General: NAD  Neuro: A&O, strength 4/5 bilateral upper extremities, lifts both legs against gravity, smile symmetric Cardiovascular: RRR, no murmurs, no peripheral edema Respiratory: normal WOB on RA, CTAB, no wheezes, ronchi or rales Abdomen: soft, NTTP, no rebound or guarding Extremities: Moving all 4 extremities equally   Laboratory: Most recent CBC Lab Results  Component Value Date   WBC 9.6 07/29/2022   HGB 10.4 (L) 07/29/2022   HCT 31.2 (L) 07/29/2022   MCV 85.7 07/29/2022   PLT 379 07/29/2022   Most recent BMP    Latest Ref Rng & Units 07/31/2022    5:24 AM  BMP  Glucose 70 - 99 mg/dL 86   BUN 8 - 23 mg/dL 37   Creatinine 0.44 - 1.00 mg/dL 9.04   Sodium 135 - 145 mmol/L 134   Potassium 3.5 - 5.1 mmol/L 4.4   Chloride 98 - 111 mmol/L 91   CO2 22 - 32 mmol/L 26   Calcium 8.9 - 10.3 mg/dL 9.3     Other pertinent labs: none   Imaging/Diagnostic Tests: CT Chest 1. No evidence for mediastinal stranding or fluid collection. 2. No acute cardiopulmonary process. 3. Borderline cardiomegaly. 4. 2 mm right solid pulmonary nodule within the upper lobe. Per Fleischner Society Guidelines, if patient is low risk for malignancy, no  routine follow-up imaging is recommended. If patient is high risk for malignancy, a non-contrast Chest CT at 12 months is optional. If performed and the nodule is stable at 12 months, no further follow-up is recommended.  Salvadore Oxford, MD 07/31/2022, 12:30 PM  PGY-1, [place] Intern pager: [phone], text pages  welcome Secure chat group [place]

## 2022-07-31 NOTE — Procedures (Signed)
I was present at this dialysis session. I have reviewed the session itself and made appropriate changes.   Filed Weights   07/29/22 0420 07/29/22 0757 07/29/22 1252  Weight: 93.9 kg 93.8 kg 90.2 kg    Recent Labs  Lab 07/31/22 0524  NA 134*  K 4.4  CL 91*  CO2 26  GLUCOSE 86  BUN 37*  CREATININE 9.04*  CALCIUM 9.3  PHOS 6.2*    Recent Labs  Lab 07/27/22 0126 07/28/22 0245 07/29/22 0027  WBC 8.1 7.6 9.6  HGB 11.1* 10.2* 10.4*  HCT 33.8* 31.9* 31.2*  MCV 85.8 87.2 85.7  PLT 296 327 379    Scheduled Meds:  aspirin EC  81 mg Oral Daily   atorvastatin  40 mg Oral Daily   cinacalcet  90 mg Oral Q M,W,F-HD   darbepoetin (ARANESP) injection - DIALYSIS  25 mcg Subcutaneous Q Mon-1800   feeding supplement (NEPRO CARB STEADY)  237 mL Oral BID BM   heparin injection (subcutaneous)  5,000 Units Subcutaneous Q8H   heparin sodium (porcine)       isosorbide-hydrALAZINE  1 tablet Oral BID   lidocaine  1 patch Transdermal Q24H   metoprolol tartrate  12.5 mg Oral BID   pantoprazole  40 mg Oral Daily   polyethylene glycol  17 g Oral Daily   sucroferric oxyhydroxide  500 mg Oral TID WC   Continuous Infusions:  [START ON 08/02/2022] vancomycin     vancomycin     PRN Meds:.acetaminophen, calcium carbonate, capsaicin, heparin sodium (porcine), lidocaine, nitroGLYCERIN   Santiago Bumpers,  MD 07/31/2022, 10:22 AM

## 2022-08-01 DIAGNOSIS — I330 Acute and subacute infective endocarditis: Secondary | ICD-10-CM | POA: Diagnosis not present

## 2022-08-01 MED ORDER — CHLORHEXIDINE GLUCONATE CLOTH 2 % EX PADS
6.0000 | MEDICATED_PAD | Freq: Every day | CUTANEOUS | Status: DC
  Administered 2022-08-01 – 2022-08-02 (×2): 6 via TOPICAL

## 2022-08-01 NOTE — Progress Notes (Signed)
[facility] Progress Note   Subjective:   No new concerns today, denies SOB, CP, dizziness and nausea. Tolerated HD yesterday with 580ml UF, did have some hypotension.   Objective Vitals:   08/01/22 0717 08/01/22 0800 08/01/22 0815 08/01/22 0852  BP: (!) 86/56 (!) 107/91 115/66 97/64  Pulse: 92 91 93   Resp: 18 17 (!) 24   Temp: 97.9 F (36.6 C) 98.2 F (36.8 C)    TempSrc: Axillary Oral    SpO2: 99% 100% 100%   Weight:      Height:       Physical Exam General: Alert elderly female in NAD Heart: RRR, no murmur Lungs: CTA bilaterally, respirations unlabored Abdomen: Soft, non-distended, +BS Extremities: No edema b/l lower extremities Dialysis Access:  LUE AVF + bruit  Additional Objective Labs: Basic Metabolic Panel: Recent Labs  Lab 07/28/22 0245 07/29/22 0027 07/31/22 0524  NA 132* 129* 134*  K 3.7 3.7 4.4  CL 92* 90* 91*  CO2 26 24 26   GLUCOSE 83 89 86  BUN 36* 45* 37*  CREATININE 8.57* 10.28* 9.04*  CALCIUM 8.4* 8.8* 9.3  PHOS 5.6* 6.9* 6.2*   Liver Function Tests: Recent Labs  Lab 07/28/22 0245 07/29/22 0027 07/31/22 0524  ALBUMIN 2.8* 2.9* 3.4*   No results for input(s): "LIPASE", "AMYLASE" in the last 168 hours. CBC: Recent Labs  Lab 07/26/22 1414 07/27/22 0126 07/28/22 0245 07/29/22 0027 07/31/22 0540  WBC 7.4 8.1 7.6 9.6 14.8*  HGB 10.6* 11.1* 10.2* 10.4* 11.7*  HCT 33.1* 33.8* 31.9* 31.2* 37.2  MCV 86.9 85.8 87.2 85.7 88.8  PLT 260 296 327 379 456*   Blood Culture    Component Value Date/Time   SDES BLOOD SITE NOT SPECIFIED 07/26/2022 0840   SPECREQUEST  07/26/2022 0840    BOTTLES DRAWN AEROBIC AND ANAEROBIC Blood Culture adequate volume   CULT  07/26/2022 0840    NO GROWTH 5 DAYS Performed at [facility], [place] [address], [place], [place] [id number]    REPTSTATUS 07/31/2022 FINAL 07/26/2022 0840    Cardiac Enzymes: No results for input(s): "CKTOTAL", "CKMB", "CKMBINDEX", "TROPONINI" in the last 168  hours. CBG: Recent Labs  Lab 07/26/22 0854 07/27/22 1052 07/28/22 0838  GLUCAP 78 82 88   Iron Studies: No results for input(s): "IRON", "TIBC", "TRANSFERRIN", "FERRITIN" in the last 72 hours. @lablastinr3 @ Studies/Results: CT CHEST WO CONTRAST  Result Date: 07/30/2022 CLINICAL DATA:  Lung/mediastinal abscess in sternal edema. EXAM: CT CHEST WITHOUT CONTRAST TECHNIQUE: Multidetector CT imaging of the chest was performed following the standard protocol without IV contrast. RADIATION DOSE REDUCTION: This exam was performed according to the departmental dose-optimization program which includes automated exposure control, adjustment of the mA and/or kV according to patient size and/or use of iterative reconstruction technique. COMPARISON:  Cardiac CT 07/27/2022 FINDINGS: Cardiovascular: Heart is borderline enlarged. Aorta is normal in size. There are atherosclerotic calcifications of the aorta and coronary arteries. Mediastinum/Nodes: No enlarged mediastinal or axillary lymph nodes. Thyroid gland, trachea, and esophagus demonstrate no significant findings. There is no mediastinal stranding or fluid collection. Lungs/Pleura: There are atelectatic changes in the lung bases. Bilateral upper lobe ground-glass opacities on prior examination have largely resolved. There is no new focal lung infiltrate. There is a 2 mm nodule in the right upper lobe image 4/40. There is no pleural effusion or pneumothorax. Upper Abdomen: No acute abnormality. Cholecystectomy clips are present. There are questionable renal calcifications. Musculoskeletal: There is no acute fracture or dislocation. There are  subcentimeter scattered lucent areas within the sternum. There is no evidence for cortical erosion or periosteal reaction. There is no evidence for fluid collection or soft tissue gas. IMPRESSION: 1. No evidence for mediastinal stranding or fluid collection. 2. No acute cardiopulmonary process. 3. Borderline cardiomegaly. 4. 2  mm right solid pulmonary nodule within the upper lobe. Per Fleischner Society Guidelines, if patient is low risk for malignancy, no routine follow-up imaging is recommended. If patient is high risk for malignancy, a non-contrast Chest CT at 12 months is optional. If performed and the nodule is stable at 12 months, no further follow-up is recommended. These guidelines do not apply to immunocompromised patients and patients with cancer. Follow up in patients with significant comorbidities as clinically warranted. For lung cancer screening, adhere to Lung-RADS guidelines. Reference: Radiology. 2017; 284(1):228-43. Aortic Atherosclerosis (ICD10-I70.0). Electronically Signed   By: Ronney Asters M.D.   On: 07/30/2022 19:58   Medications:  [START ON 08/02/2022] vancomycin      aspirin EC  81 mg Oral Daily   atorvastatin  40 mg Oral Daily   cinacalcet  90 mg Oral Q M,W,F-HD   darbepoetin (ARANESP) injection - DIALYSIS  25 mcg Subcutaneous Q Mon-1800   feeding supplement (NEPRO CARB STEADY)  237 mL Oral BID BM   heparin injection (subcutaneous)  5,000 Units Subcutaneous Q8H   isosorbide-hydrALAZINE  1 tablet Oral BID   lidocaine  1 patch Transdermal Q24H   metoprolol tartrate  12.5 mg Oral BID   pantoprazole  40 mg Oral Daily   polyethylene glycol  17 g Oral Daily   sucroferric oxyhydroxide  500 mg Oral TID WC    OP Dialysis Orders: [facility]  4h  400/500   93kg   2/2 bath  LUA AVF  Heparin 4000+ 2043midrun - last HD 3/15, post wt was 92.9kg - venofer 100mg  tiw IV thru 3/25 - sensipar 90 mg po tiw - mircera 30 mcg IV q 2 wks, last 3/1, due 3/15 but held due to Hb 11.1  Assessment/Plan: ESRD - on HD MWF. per regular schedule,  NSTEMI/Acute systolic HF/perivalvular abscess-cardiology evaluating ,elevated troponin's, getting IV heparin. Cardiac cath 3/21 with no CAD. Wall motion consistent with Takotsubo CM. TEE = concern for  perivalvular absces and right atrial wall mass /cardiac CTA, currently no  plans for surgical intervention and treating with antibiotics. 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  No plans for surgical intervention Multifocal PNA - CXR w/ R sided infiltrates. On ABX.  HTN/volume  - BP soft overnight. No volume overload on exam. 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  Phosphorus mildly elevated.  Cont sensipar and binders.   Anice Paganini, PA-C 08/01/2022, 9:45 AM  [facility] Pager: [phone]

## 2022-08-01 NOTE — Progress Notes (Signed)
     Daily Progress Note Intern Pager: [phone]  Patient name: [name] Medical record number: [id number] Date of birth: 1960-10-30 Age: 62 y.o. Gender: female  Primary Care Provider: Lowry Ram, MD Consultants:  Cardiology, Nephrology, ID, Neurology, CTS  Code Status: Full Code  Pt Overview and Major Events to Date:  3/20 Admitted 3/21 [place] 3/22 TEE 3/23 CT coronary morph, CODE STROKE  Assessment and Plan:  Ms. Juaire is a 62 year old woman with PMH of ESRD on HD MWF, HTN, T2DM, CVA, paroxysmal VT, HLD, GERD currently admitted for suspected acute bacterial endocarditis, initially presenting with pneumonia and NSTEMI.    Medically Stable for discharge to CIR.   * Acute bacterial endocarditis VSS. Afebrile. Cardiology recommends FDG PET CT to distinguish degenerative disease vs inflammation, however this scan is not available at [facility]. NGTD on repeat Bcx. NSTEMI resolved but high risk for repeat cardiac event. - ID consulted, recs appreciated - Cardiology consulted, appreciate recs - CTS consulted, recs appreciated - not a surgical candidate - Continue Vancomycin 1000 mg MWF, 05/01  Stroke due to embolism [facility]) Neuro status stable. - Appreciate ongoing care by stroke team - Cleared by SLP - Aspirin, subQ heparin - Neuro checks   Localized swelling of chest wall Uncertain chronicity. Chest xray no acute bony abnormality. No sign of erythema or drainage. CT negative. -Continue to monitor -Consider mandibular chest x ray view -Consider soft tissue US  ESRD on dialysis ([place]) HD on MWF. Renal function at baseline reassuringly despite repeat contrast. -Nephrology consulted recs, appreciated -Electrolyte management per dialysis  -Velphoro, Sensipar  Multifocal pneumonia-resolved as of 08/01/2022 Respiratory status stable. - Completed empiric antibiotic course   FEN/GI: Heart healthy, carb modified  PPx: ASA, SubQ Heparin  Dispo:CIR  Pending bed opening .    Subjective:  Doing well this morning, no complaints  Objective: Temp:  [97.8 F (36.6 C)-98.2 F (36.8 C)] 98 F (36.7 C) (03/28 1518) Pulse Rate:  [88-100] 88 (03/28 2050) Resp:  [16-20] 17 (03/28 1518) BP: (86-115)/(56-91) 100/58 (03/28 2050) SpO2:  [99 %-100 %] 100 % (03/28 2050) Physical Exam: General: NAD, lying in bed comfortably, cooperative Cardiovascular: RRR, [place] Respiratory: CTABL Abdomen: Soft, NTTP, non-distended Extremities: Moving all extremities, no edema  Laboratory: Most recent CBC Lab Results  Component Value Date   WBC 14.8 (H) 07/31/2022   HGB 11.7 (L) 07/31/2022   HCT 37.2 07/31/2022   MCV 88.8 07/31/2022   PLT 456 (H) 07/31/2022   Most recent BMP    Latest Ref Rng & Units 07/31/2022    5:24 AM  BMP  Glucose 70 - 99 mg/dL 86   BUN 8 - 23 mg/dL 37   Creatinine 0.44 - 1.00 mg/dL 9.04   Sodium 135 - 145 mmol/L 134   Potassium 3.5 - 5.1 mmol/L 4.4   Chloride 98 - 111 mmol/L 91   CO2 22 - 32 mmol/L 26   Calcium 8.9 - 10.3 mg/dL 9.3     Holley Bouche, MD 08/01/2022, 10:24 PM  PGY-2, [place] Intern pager: [phone], text pages welcome Secure chat group [place]

## 2022-08-01 NOTE — Progress Notes (Signed)
     Daily Progress Note Intern Pager: [phone]  Patient name: [name] Medical record number: [id number] Date of birth: [birth year] Age: 62 y.o. Gender: female  Primary Care Provider: Lowry Ram, MD Consultants: Cardiology, Nephrology, ID, Neurology, CTS  Code Status: Full  Pt Overview and Major Events to Date:  3/20 Admitted 3/21 [place] 3/22 TEE 3/23 CT coronary morph, CODE STROKE  Assessment and Plan: Ms. Masarik is a 62 year old woman with PMH of ESRD on HD MWF, HTN, T2DM, CVA, paroxysmal VT, HLD, GERD currently admitted for suspected acute bacterial endocarditis, initially presenting with pneumonia and NSTEMI.    Medically Stable for discharge to CIR.  * Acute bacterial endocarditis VSS. Afebrile. Cardiology recommends FDG PET CT to distinguish degenerative disease vs inflammation, however this scan is not available at [facility]. NGTD on repeat Bcx. NSTEMI resolved but high risk for repeat cardiac event. - ID consulted, recs appreciated - Cardiology consulted, appreciate recs - CTS consulted, recs appreciated - not a surgical candidate - Continue Vancomycin 1000 mg MWF, 05/01  Stroke due to embolism [facility]) Neuro status stable. - Appreciate ongoing care by stroke team - Cleared by SLP - Aspirin, subQ heparin - Neuro checks   Localized swelling of chest wall Uncertain chronicity. Chest xray no acute bony abnormality. No sign of erythema or drainage. CT negative. -Continue to monitor -Consider mandibular chest x ray view -Consider soft tissue US  ESRD on dialysis ([place]) HD on MWF. Renal function at baseline reassuringly despite repeat contrast. -Nephrology consulted recs, appreciated -Electrolyte management per dialysis  -Velphoro, Sensipar  Multifocal pneumonia-resolved as of 08/01/2022 Respiratory status stable. - Completed empiric antibiotic course    FEN/GI: Heart healthy, carb modified PPx: ASA, SubQ Heparin Dispo:CIR, pending insurance auth   Subjective:   NAEO. Patient states she is doing well. A little confused. Actively working with physical therapy.  Objective: Temp:  [97.6 F (36.4 C)-98.2 F (36.8 C)] 98.2 F (36.8 C) (03/28 0800) Pulse Rate:  [91-101] 93 (03/28 0815) Resp:  [17-22] 20 (03/28 0815) BP: (82-115)/(56-91) 97/64 (03/28 0852) SpO2:  [99 %-100 %] 100 % (03/28 0815) Physical Exam: General: NAD, lying comfortably in hospital bed Cardiovascular: RRR, no murmurs, no peripheral edema Respiratory: normal WOB on RA, CTAB, no wheezes, ronchi or rales Abdomen: soft, NTTP, no rebound or guarding Extremities: Moving all 4 extremities equally   Laboratory: Most recent CBC Lab Results  Component Value Date   WBC 14.8 (H) 07/31/2022   HGB 11.7 (L) 07/31/2022   HCT 37.2 07/31/2022   MCV 88.8 07/31/2022   PLT 456 (H) 07/31/2022   Most recent BMP    Latest Ref Rng & Units 07/31/2022    5:24 AM  BMP  Glucose 70 - 99 mg/dL 86   BUN 8 - 23 mg/dL 37   Creatinine 0.44 - 1.00 mg/dL 9.04   Sodium 135 - 145 mmol/L 134   Potassium 3.5 - 5.1 mmol/L 4.4   Chloride 98 - 111 mmol/L 91   CO2 22 - 32 mmol/L 26   Calcium 8.9 - 10.3 mg/dL 9.3     Other pertinent labs: none   Imaging/Diagnostic Tests: No new imaging.  Salvadore Oxford, MD 08/01/2022, 11:01 AM  PGY-1, [place] Intern pager: [phone], text pages welcome Secure chat group [place]

## 2022-08-01 NOTE — Progress Notes (Signed)
Occupational Therapy Treatment Patient Details Name: [name] MRN: [id number] DOB: 01/13/1961 Today's Date: 08/01/2022   History of present illness 62 y.o. female presents [facility] hospital on 07/23/2022 with chest pain after being found down at home, found to have NSTEMI and PNA. Code stroke called 3/23 and MRI revealed multiple scattered subcentimeter acute ischemic infarcts  involving the bilateral cerebral hemispheres and pons.   PMH includes DM, ESRD, GERD, HLD, HTN.   OT comments  Pt seen for OT ADL retraining session with focus on bed mobility total assist +2, sitting at EOB with Max-total assist +2 with consistent verbal and tactile cues to assist in maintaining upright posture (pt with noted right lateral lean in sitting followed by posterior lean and left lateral lean at times). Weight shifting sitting at EOB with consistent verbal and tactile cues and Mod-Max +1-2 assist that fluctuated to total +2 assist near end of session. Pt attempted reaching with right and left UE's in sitting with hand over hand technique as pt was noted to keep elbows flexed at her side. Discussed going to in-pt rehab and goals for increased independence and balance and pt verbalized agreement with this. Will continue to follow acutely.   Recommendations for follow up therapy are one component of a multi-disciplinary discharge planning process, led by the attending physician.  Recommendations may be updated based on patient status, additional functional criteria and insurance authorization.    Assistance Recommended at Discharge Frequent or constant Supervision/Assistance  Patient can return home with the following  A lot of help with bathing/dressing/bathroom;Assist for transportation;Direct supervision/assist for medications management;Assistance with cooking/housework;Help with stairs or ramp for entrance;Two people to help with walking and/or transfers;Direct supervision/assist for financial management    Equipment Recommendations  Other (comment) (Defer to next venue)    Recommendations for Other Services      Precautions / Restrictions Precautions Precautions: Fall Precaution Comments: watch BP Restrictions Weight Bearing Restrictions: No       Mobility Bed Mobility Overal bed mobility: Needs Assistance Bed Mobility: Supine to Sit, Sit to Supine     Supine to sit: +2 for physical assistance, Total assist Sit to supine: +2 for physical assistance, Total assist   General bed mobility comments: pt with heavy lean to R this session and posterior vs forward lean at times, consistent verbal and tactile cues required to keep head upright and weight shift Patient Response: Flat affect  Transfers   General transfer comment: Attempted sit to stand x1 but pt unable to clear hips from EOB     Balance Overall balance assessment: Needs assistance Sitting-balance support: Feet supported, Single extremity supported, Bilateral upper extremity supported Sitting balance-Leahy Scale: Poor Sitting balance - Comments: posterior and R vs L lateral lean at times. Pt required Mod +1- total +2 A to correct. Postural control: Posterior lean, Right lateral lean, Left lateral lean     ADL either performed or assessed with clinical judgement   ADL Overall ADL's : Needs assistance/impaired     Grooming: Wash/dry hands;Wash/dry face;Bed level;Set up Grooming Details (indicate cue type and reason): VC's and increased time required to use R UE to wash face with assist from L UE   General ADL Comments: Pt seen for co-tx with PT with focus on bed mobility total assist +2, sitting at EOB with Max-total assist +2 with consistent verbal and tactile cues to assist in maintaining upright posture (pt with noted right lateral lean in sitting followed by posterior lean and left lateral lean  at times). Weight shifting sitting at EOB with consistent verbal and tactile cues and Mod-Max +1-2 assist that fluctuated  to total +2 assist near end of session. Pt attempted reaching with right and left UE's in sitting with hand over hand technique as pt was noted to keep elbows flexed at her side. Discussed going to in-pt rehab and goals for increased independence and balance and pt verbalized agreement with this. Will continue to follow acutely.    Extremity/Trunk Assessment Upper Extremity Assessment Upper Extremity Assessment: Defer to OT evaluation   Lower Extremity Assessment Lower Extremity Assessment: Defer to PT evaluation        Vision Patient Visual Report:  (Difficult to assess)            Cognition Arousal/Alertness: Awake/alert Behavior During Therapy: Flat affect Overall Cognitive Status: Impaired/Different from baseline Area of Impairment: Safety/judgement, Following commands, Attention, Problem solving   Current Attention Level: Selective   Following Commands: Follows one step commands with increased time Safety/Judgement: Decreased awareness of deficits, Decreased awareness of safety Awareness: Intellectual Problem Solving: Slow processing, Decreased initiation, Difficulty sequencing, Requires verbal cues, Requires tactile cues                General Comments BP 97/64 Supine; BP 108/58 sitting at EOB    Pertinent Vitals/ Pain       Pain Assessment Pain Assessment: No/denies pain Faces Pain Scale: No hurt  Home Living Family/patient expects to be discharged to:: Private residence Living Arrangements: Children Available Help at Discharge: Family;Available PRN/intermittently Type of Home: Apartment Home Access: Stairs to enter Entrance Stairs-Number of Steps: flight Entrance Stairs-Rails: Right Home Layout: One level     Bathroom Shower/Tub: Teacher, early years/pre: Standard Bathroom Accessibility: Yes   Home Equipment: None      Lives With: Son    Prior Functioning/Environment   Refer to initial OT assessment for details   Frequency  Min  2X/week        Progress Toward Goals  OT Goals(current goals can now be found in the care plan section)  Progress towards OT goals: Progressing toward goals  Acute Rehab OT Goals Patient Stated Goal: None stated, pt verbalized agreement with in-pt Rehab OT Goal Formulation: With patient/family Time For Goal Achievement: 08/11/22 Potential to Achieve Goals: Good  Plan Discharge plan remains appropriate;Frequency remains appropriate    Co-evaluation      Reason for Co-Treatment: To address functional/ADL transfers;For patient/therapist safety;Complexity of the patient's impairments (multi-system involvement) PT goals addressed during session: Mobility/safety with mobility OT goals addressed during session: ADL's and self-care;Strengthening/ROM      AM-PAC OT "6 Clicks" Daily Activity     Outcome Measure   Help from another person eating meals?: A Little Help from another person taking care of personal grooming?: A Little Help from another person toileting, which includes using toliet, bedpan, or urinal?: A Lot Help from another person bathing (including washing, rinsing, drying)?: A Lot Help from another person to put on and taking off regular upper body clothing?: A Lot Help from another person to put on and taking off regular lower body clothing?: Total 6 Click Score: 13    End of Session Equipment Utilized During Treatment: Gait belt;Rolling walker (2 wheels)  OT Visit Diagnosis: Unsteadiness on feet (R26.81);Muscle weakness (generalized) (M62.81);History of falling (Z91.81);Other symptoms and signs involving cognitive function;Pain   Activity Tolerance Patient tolerated treatment well;Patient limited by fatigue   Patient Left in bed;with call bell/phone within reach;with bed alarm  set   Nurse Communication Mobility status        Time: [id number] OT Time Calculation (min): 26 min  Charges: OT General Charges $OT Visit: 1 Visit OT Treatments $Self Care/Home  Management : 8-22 mins   Josephine Igo Dixon, OTR/L 08/01/2022, 9:01 AM

## 2022-08-01 NOTE — Progress Notes (Signed)
Inpatient Rehab Admissions Coordinator:   I did receive insurance approval for CIR.  Note slow to progress mobility with therapy, still requiring total +2 for EOB and attempts at standing.  May benefit from lateral scoot/slide board transfer to progress out of bed.  Will follow.    Shann Medal, PT, DPT Admissions Coordinator [phone] 08/01/22  1:09 PM

## 2022-08-01 NOTE — Progress Notes (Signed)
Physical Therapy Treatment Patient Details Name: [name] MRN: [id number] DOB: Apr 20, 1961 Today's Date: 08/01/2022   History of Present Illness 62 y.o. female presents to [facility] hospital on 07/23/2022 with chest pain after being found down at home, found to have NSTEMI and PNA. Code stroke called 3/23 and MRI revealed multiple scattered subcentimeter acute ischemic infarcts  involving the bilateral cerebral hemispheres and pons.   PMH includes DM, ESRD, GERD, HLD, HTN.    PT Comments    Pt with similar presentation to previous session. Co treat with OT. Pt continues to be +2 Total Assist for all mobility. Attempted 1 sit to stand with pt however pt was unable to clear hips. No change in DC/DME recs at this time. PT will continue to follow.   Recommendations for follow up therapy are one component of a multi-disciplinary discharge planning process, led by the attending physician.  Recommendations may be updated based on patient status, additional functional criteria and insurance authorization.  Follow Up Recommendations  Can patient physically be transported by private vehicle: No    Assistance Recommended at Discharge Frequent or constant Supervision/Assistance  Patient can return home with the following Two people to help with walking and/or transfers;Two people to help with bathing/dressing/bathroom;Assistance with cooking/housework;Assistance with feeding;Direct supervision/assist for medications management;Direct supervision/assist for financial management;Assist for transportation;Help with stairs or ramp for entrance   Equipment Recommendations  Wheelchair (measurements PT);Wheelchair cushion (measurements PT);BSC/3in1    Recommendations for Other Services       Precautions / Restrictions Precautions Precautions: Fall Precaution Comments: watch BP Restrictions Weight Bearing Restrictions: No     Mobility  Bed Mobility Overal bed mobility: Needs Assistance Bed Mobility:  Supine to Sit, Sit to Supine     Supine to sit: +2 for physical assistance, Total assist Sit to supine: +2 for physical assistance, Total assist   General bed mobility comments: pt with heavy lean to R this session and posterior vs forward lean at times, consistent verbal and tactile cues required to keep head upright and weight shift    Transfers                   General transfer comment: Attempted sit to stand x1 but pt unable to clear hips from EOB    Ambulation/Gait                   Stairs             Wheelchair Mobility    Modified Rankin (Stroke Patients Only)       Balance Overall balance assessment: Needs assistance Sitting-balance support: Feet supported, Single extremity supported, Bilateral upper extremity supported Sitting balance-Leahy Scale: Poor Sitting balance - Comments: posterior and R vs L lateral lean at times. Pt required Mod +1- total +2 A to correct. Postural control: Posterior lean, Right lateral lean, Left lateral lean                                  Cognition Arousal/Alertness: Awake/alert Behavior During Therapy: Flat affect Overall Cognitive Status: Impaired/Different from baseline Area of Impairment: Safety/judgement, Following commands, Attention, Problem solving                   Current Attention Level: Selective   Following Commands: Follows one step commands with increased time Safety/Judgement: Decreased awareness of deficits, Decreased awareness of safety Awareness: Intellectual Problem Solving: Slow processing, Decreased initiation, Difficulty  sequencing, Requires verbal cues, Requires tactile cues          Exercises      General Comments General comments (skin integrity, edema, etc.): BP: 97/64 supine; BP: 108/58 seated EOB      Pertinent Vitals/Pain Pain Assessment Pain Assessment: No/denies pain    Home Living Family/patient expects to be discharged to:: Private  residence Living Arrangements: Children Available Help at Discharge: Family;Available PRN/intermittently Type of Home: Apartment Home Access: Stairs to enter Entrance Stairs-Rails: Right Entrance Stairs-Number of Steps: flight   Home Layout: One level Home Equipment: None      Prior Function            PT Goals (current goals can now be found in the care plan section) Progress towards PT goals: Progressing toward goals    Frequency    Min 1X/week      PT Plan Current plan remains appropriate    Co-evaluation PT/OT/SLP Co-Evaluation/Treatment: Yes Reason for Co-Treatment: To address functional/ADL transfers;For patient/therapist safety;Complexity of the patient's impairments (multi-system involvement) PT goals addressed during session: Mobility/safety with mobility OT goals addressed during session: ADL's and self-care;Strengthening/ROM      AM-PAC PT "6 Clicks" Mobility   Outcome Measure  Help needed turning from your back to your side while in a flat bed without using bedrails?: Total Help needed moving from lying on your back to sitting on the side of a flat bed without using bedrails?: Total Help needed moving to and from a bed to a chair (including a wheelchair)?: Total Help needed standing up from a chair using your arms (e.g., wheelchair or bedside chair)?: Total Help needed to walk in hospital room?: Total Help needed climbing 3-5 steps with a railing? : Total 6 Click Score: 6    End of Session Equipment Utilized During Treatment: Gait belt Activity Tolerance: Patient limited by fatigue Patient left: in bed;with call bell/phone within reach;with bed alarm set Nurse Communication: Mobility status PT Visit Diagnosis: Other abnormalities of gait and mobility (R26.89);Muscle weakness (generalized) (M62.81)     Time: [id number] PT Time Calculation (min) (ACUTE ONLY): 23 min  Charges:  $Therapeutic Activity: 8-22 mins                     Shelby Mattocks, PT,  DPT Acute Rehab Services [id number]    Viann Shove 08/01/2022, 10:00 AM

## 2022-08-02 DIAGNOSIS — I330 Acute and subacute infective endocarditis: Secondary | ICD-10-CM | POA: Diagnosis not present

## 2022-08-02 LAB — RENAL FUNCTION PANEL
Albumin: 3.4 g/dL — ABNORMAL LOW (ref 3.5–5.0)
Anion gap: 16 — ABNORMAL HIGH (ref 5–15)
BUN: 40 mg/dL — ABNORMAL HIGH (ref 8–23)
CO2: 26 mmol/L (ref 22–32)
Calcium: 9.6 mg/dL (ref 8.9–10.3)
Chloride: 91 mmol/L — ABNORMAL LOW (ref 98–111)
Creatinine, Ser: 8.4 mg/dL — ABNORMAL HIGH (ref 0.44–1.00)
GFR, Estimated: 5 mL/min — ABNORMAL LOW (ref >60)
Glucose, Bld: 87 mg/dL (ref 70–99)
Phosphorus: 5.7 mg/dL — ABNORMAL HIGH (ref 2.5–4.6)
Potassium: 4.5 mmol/L (ref 3.5–5.1)
Sodium: 133 mmol/L — ABNORMAL LOW (ref 135–145)

## 2022-08-02 LAB — CBC
HCT: 38 % (ref 36.0–46.0)
Hemoglobin: 12.2 g/dL (ref 12.0–15.0)
MCH: 28.4 pg (ref 26.0–34.0)
MCHC: 32.1 g/dL (ref 30.0–36.0)
MCV: 88.4 fL (ref 80.0–100.0)
Platelets: 395 10*3/uL (ref 150–400)
RBC: 4.3 MIL/uL (ref 3.87–5.11)
RDW: 15.9 % — ABNORMAL HIGH (ref 11.5–15.5)
WBC: 12.9 10*3/uL — ABNORMAL HIGH (ref 4.0–10.5)
nRBC: 0.2 % (ref 0.0–0.2)

## 2022-08-02 MED ORDER — HEPARIN SODIUM (PORCINE) 1000 UNIT/ML IJ SOLN
INTRAMUSCULAR | Status: AC
  Filled 2022-08-02: qty 4

## 2022-08-02 MED ORDER — LIDOCAINE 5 % EX PTCH
1.0000 | MEDICATED_PATCH | CUTANEOUS | Status: DC
  Administered 2022-08-02 – 2022-08-07 (×6): 1 via TRANSDERMAL
  Filled 2022-08-02 (×6): qty 1

## 2022-08-02 MED ORDER — HEPARIN SODIUM (PORCINE) 1000 UNIT/ML DIALYSIS
2000.0000 [IU] | INTRAMUSCULAR | Status: AC | PRN
  Administered 2022-08-02: 2000 [IU] via INTRAVENOUS_CENTRAL

## 2022-08-02 MED ORDER — HEPARIN SODIUM (PORCINE) 1000 UNIT/ML IJ SOLN
INTRAMUSCULAR | Status: AC
  Filled 2022-08-02: qty 2

## 2022-08-02 MED ORDER — SUCROFERRIC OXYHYDROXIDE 500 MG PO CHEW
1000.0000 mg | CHEWABLE_TABLET | Freq: Three times a day (TID) | ORAL | Status: DC
  Administered 2022-08-02 – 2022-08-07 (×8): 1000 mg via ORAL
  Filled 2022-08-02 (×20): qty 2

## 2022-08-02 MED ORDER — HEPARIN SODIUM (PORCINE) 1000 UNIT/ML DIALYSIS
4000.0000 [IU] | Freq: Once | INTRAMUSCULAR | Status: AC
  Administered 2022-08-02: 4000 [IU] via INTRAVENOUS_CENTRAL

## 2022-08-02 NOTE — Procedures (Signed)
I was present at this dialysis session. I have reviewed the session itself and made appropriate changes.   Filed Weights   07/29/22 0420 07/29/22 0757 07/29/22 1252  Weight: 93.9 kg 93.8 kg 90.2 kg    Recent Labs  Lab 08/02/22 0645  NA 133*  K 4.5  CL 91*  CO2 26  GLUCOSE 87  BUN 40*  CREATININE 8.40*  CALCIUM 9.6  PHOS 5.7*    Recent Labs  Lab 07/29/22 0027 07/31/22 0540 08/02/22 0645  WBC 9.6 14.8* 12.9*  HGB 10.4* 11.7* 12.2  HCT 31.2* 37.2 38.0  MCV 85.7 88.8 88.4  PLT 379 456* 395    Scheduled Meds:  aspirin EC  81 mg Oral Daily   atorvastatin  40 mg Oral Daily   Chlorhexidine Gluconate Cloth  6 each Topical Q0600   cinacalcet  90 mg Oral Q M,W,F-HD   darbepoetin (ARANESP) injection - DIALYSIS  25 mcg Subcutaneous Q Mon-1800   feeding supplement (NEPRO CARB STEADY)  237 mL Oral BID BM   heparin  4,000 Units Dialysis Once in dialysis   heparin injection (subcutaneous)  5,000 Units Subcutaneous Q8H   heparin sodium (porcine)       heparin sodium (porcine)       isosorbide-hydrALAZINE  1 tablet Oral BID   lidocaine  1 patch Transdermal Q24H   metoprolol tartrate  12.5 mg Oral BID   pantoprazole  40 mg Oral Daily   polyethylene glycol  17 g Oral Daily   sucroferric oxyhydroxide  500 mg Oral TID WC   Continuous Infusions:  vancomycin     PRN Meds:.acetaminophen, calcium carbonate, capsaicin, [START ON 08/03/2022] heparin, heparin sodium (porcine), heparin sodium (porcine), lidocaine   Santiago Bumpers,  MD 08/02/2022, 10:10 AM

## 2022-08-02 NOTE — Progress Notes (Signed)
Physical Therapy Treatment Patient Details Name: [name] MRN: [id number] DOB: February 09, 1961 Today's Date: 08/02/2022   History of Present Illness 62 y.o. female presents to [facility] hospital on 07/23/2022 with chest pain after being found down at home, found to have NSTEMI and PNA. Code stroke called 3/23 and MRI revealed multiple scattered subcentimeter acute ischemic infarcts  involving the bilateral cerebral hemispheres and pons.   PMH includes DM, ESRD, GERD, HLD, HTN.    PT Comments    Pt continues to require total assist for all mobility and max assist to maintain sitting EOB. Pt has poor motor planning as well as poor balance and weakness. Pt unable to move LE's to command except toes which may be more of a motor planning problem than a true weakness. Pt currently will need mechanical lift for any OOB activity. Do not feel she can tolerate or progress quickly enough for intensive rehab >3 hours/day. Expect she will need less intense longer term post acute rehab.    Recommendations for follow up therapy are one component of a multi-disciplinary discharge planning process, led by the attending physician.  Recommendations may be updated based on patient status, additional functional criteria and insurance authorization.  Follow Up Recommendations  Can patient physically be transported by private vehicle: No    Assistance Recommended at Discharge Frequent or constant Supervision/Assistance  Patient can return home with the following Two people to help with walking and/or transfers;Two people to help with bathing/dressing/bathroom;Assist for transportation;Help with stairs or ramp for entrance;Direct supervision/assist for financial management;Direct supervision/assist for medications management;Assistance with cooking/housework   Equipment Recommendations  Wheelchair (measurements PT);Wheelchair cushion (measurements PT);Other (comment);Hospital bed (hoyer lift)    Recommendations for Other  Services       Precautions / Restrictions Precautions Precautions: Fall Precaution Comments: watch BP Restrictions Weight Bearing Restrictions: No     Mobility  Bed Mobility Overal bed mobility: Needs Assistance Bed Mobility: Sidelying to Sit, Sit to Supine   Sidelying to sit: +2 for physical assistance, Total assist   Sit to supine: +2 for physical assistance, Total assist   General bed mobility comments: Assist with all aspects. Pt not able to assist with movement of LE's and needs hand over hand assist to move place arms in a position to possibly assist.    Transfers                   General transfer comment: Will need mechanical lift    Ambulation/Gait                   Stairs             Wheelchair Mobility    Modified Rankin (Stroke Patients Only) Modified Rankin (Stroke Patients Only) Pre-Morbid Rankin Score: No significant disability Modified Rankin: Severe disability     Balance Overall balance assessment: Needs assistance Sitting-balance support: Bilateral upper extremity supported, Feet supported Sitting balance-Leahy Scale: Zero Sitting balance - Comments: Pt required max assist to maintain sitting EOB. Leaning in all different directions at times. Intermittent minimally balance reactions but unable to correct without max assist.                                    Cognition Arousal/Alertness: Awake/alert Behavior During Therapy: Flat affect Overall Cognitive Status: Impaired/Different from baseline Area of Impairment: Attention, Following commands, Problem solving, Awareness  Current Attention Level: Sustained   Following Commands: Follows one step commands inconsistently, Follows one step commands with increased time   Awareness: Intellectual Problem Solving: Slow processing, Decreased initiation, Difficulty sequencing, Requires verbal cues, Requires tactile cues General Comments:  Pt with very slow processing and appears to have significant problems with motor planning.        Exercises      General Comments General comments (skin integrity, edema, etc.): VSS on RA      Pertinent Vitals/Pain Pain Assessment Pain Assessment: No/denies pain    Home Living                          Prior Function            PT Goals (current goals can now be found in the care plan section) Progress towards PT goals: Not progressing toward goals - comment;Goals downgraded-see care plan    Frequency    Min 1X/week      PT Plan Discharge plan needs to be updated    Co-evaluation              AM-PAC PT "6 Clicks" Mobility   Outcome Measure  Help needed turning from your back to your side while in a flat bed without using bedrails?: Total Help needed moving from lying on your back to sitting on the side of a flat bed without using bedrails?: Total Help needed moving to and from a bed to a chair (including a wheelchair)?: Total Help needed standing up from a chair using your arms (e.g., wheelchair or bedside chair)?: Total Help needed to walk in hospital room?: Total Help needed climbing 3-5 steps with a railing? : Total 6 Click Score: 6    End of Session   Activity Tolerance: Patient limited by fatigue Patient left: in bed;with call bell/phone within reach;with bed alarm set Nurse Communication: Mobility status;Need for lift equipment PT Visit Diagnosis: Other abnormalities of gait and mobility (R26.89);Muscle weakness (generalized) (M62.81);Apraxia (R48.2)     Time: [id number] PT Time Calculation (min) (ACUTE ONLY): 19 min  Charges:  $Therapeutic Activity: 8-22 mins                     [place] [facility] [place] 08/02/2022, 3:41 PM

## 2022-08-02 NOTE — Progress Notes (Signed)
Inpatient Rehab Admissions Coordinator:   Met with patient at bedside with PT.  Pt continues to require total assist of 1-2 for limited mobility.  I don't think she will be able to tolerate the intensity of CIR, nor do I think she will make significant gains in a short rehab period.  Recommend SNF at this time, and PT in agreement.  Will withdraw insurance request.    Shann Medal, PT, DPT Admissions Coordinator [phone] 08/02/22  3:37 PM

## 2022-08-02 NOTE — Progress Notes (Addendum)
Received patient in bed,awake,alert and oriented x 4 .  Access used: Left upper arm AVF that worked well.  Medicine given :Heparin 4,000 units pre treatment .2000 midtreatment.  Duration of treatment : 3.5 hours  Fluid removed : 200 cc  Hemo issue/comment:Last hour into her treatment ,she has had severe cramping as well as her SBP dropped to low 90"s ,a total of 300 cc were given to eased out her cramping.  Hand off to the patient's nurse.

## 2022-08-02 NOTE — NC FL2 (Addendum)
[place] LEVEL OF CARE FORM     IDENTIFICATION  Patient Name: [name] Birthdate: 18-Apr-1961 Sex: female Admission Date (Current Location): 07/23/2022  [facility] and [place] Number:  Herbalist and Address:  [facility] [place], [place] [address], [place], [place] [address]      Provider Number: [id number]  Attending Physician Name and Address:  Lind Covert, MD  Relative Name and Phone Number:  Robyne Askew (daughter) [phone]    Current Level of Care: Hospital Recommended Level of Care: [place] Prior Approval Number:    Date Approved/Denied:   PASRR Number: [id number]  Discharge Plan: SNF    Current Diagnoses: Patient Active Problem List   Diagnosis Date Noted   Localized swelling of chest wall 07/28/2022   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 07/28/2022   Acute bacterial endocarditis 07/26/2022   Stroke due to embolism ([place]) 07/24/2022   ESRD on dialysis [facility])    History of stroke 09/18/2019   Type 2 diabetes mellitus with diabetic chronic kidney disease ([place]) 02/19/2019   Anemia in chronic kidney disease 02/19/2019   Secondary hyperparathyroidism of renal origin ([place]) 02/19/2019   Venous (peripheral) insufficiency 08/06/2017   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 ([place]) 08/21/2015   Diabetic gastroparesis ([place]) 08/09/2015   Obstructive sleep apnea of adult 01/16/2012   Osteoarthritis of both knees 01/16/2012   Hyperlipidemia associated with type 2 diabetes mellitus ([place]) 07/18/2011   LEIOMYOMA, UTERUS 01/14/2007   Former smoker 07/03/2006    Orientation RESPIRATION BLADDER Height & Weight     Self, Time, Place  Normal Continent Weight: 198 lb 13.7 oz (90.2 kg) Height:  5\' 10"  (177.8 cm)  BEHAVIORAL SYMPTOMS/MOOD NEUROLOGICAL BOWEL NUTRITION STATUS      Incontinent Diet (Please see discharge summary)  AMBULATORY STATUS COMMUNICATION OF NEEDS Skin   Extensive Assist Verbally Other  (Comment) (WDL, Appropriate for ethnicity,Dry,Flaky,Wound/Incision LDAs)                       Personal Care Assistance Level of Assistance  Bathing, Feeding, Dressing Bathing Assistance: Maximum assistance Feeding assistance: Limited assistance Dressing Assistance: Maximum assistance     Functional Limitations Info  Sight, Hearing, Speech   Hearing Info:  (WDL) Speech Info: Impaired (difficulty speaking)    SPECIAL CARE FACTORS FREQUENCY  PT (By licensed PT), OT (By licensed OT)     PT Frequency: 5x min weekly OT Frequency: 5x min weekly            Contractures Contractures Info: Not present    Additional Factors Info  Code Status, Allergies Code Status Info: FULL Allergies Info: Lisinopril,Venofer  (ferric Oxide),Camellia,Jardiance (empagliflozin)           Current Medications (08/02/2022):  This is the current hospital active medication list Current Facility-Administered Medications  Medication Dose Route Frequency Provider Last Rate Last Admin   acetaminophen (TYLENOL) tablet 650 mg  650 mg Oral Q6H PRN [name], [name], MD   650 mg at 07/31/22 1627   aspirin EC tablet 81 mg  81 mg Oral Daily Leeanne Rio, MD   81 mg at 08/02/22 1257   atorvastatin (LIPITOR) tablet 40 mg  40 mg Oral Daily [name], [name], MD   40 mg at 08/02/22 1257   calcium carbonate (TUMS - dosed in mg elemental calcium) chewable tablet 200-400 mg of elemental calcium  1-2 tablet Oral TID PRN Ezequiel Essex, MD  capsaicin (ZOSTRIX) 0.025 % cream   Topical BID PRN Ezequiel Essex, MD   Given at 07/30/22 1413   Chlorhexidine Gluconate Cloth 2 % PADS 6 each  6 each Topical Q0600 Janalee Dane, PA-C   6 each at 08/02/22 0715   cinacalcet (SENSIPAR) tablet 90 mg  90 mg Oral Q M,W,F-HD [name], [name], MD   90 mg at 08/02/22 1301   feeding supplement (NEPRO CARB STEADY) liquid 237 mL  237 mL Oral BID BM Ezequiel Essex, MD   237 mL at 08/02/22 1258   heparin injection 5,000  Units  5,000 Units Subcutaneous Q8H Leeanne Rio, MD   5,000 Units at 08/02/22 1441   heparin sodium (porcine) 1000 UNIT/ML injection            heparin sodium (porcine) 1000 UNIT/ML injection            isosorbide-hydrALAZINE (BIDIL) 20-37.5 MG per tablet 1 tablet  1 tablet Oral BID Lorretta Harp, MD   1 tablet at 08/02/22 1257   lidocaine (LIDODERM) 5 % 1 patch  1 patch Transdermal Q24H Ezequiel Essex, MD   1 patch at 08/01/22 2132   lidocaine (XYLOCAINE) 2 % viscous mouth solution 15 mL  15 mL Mouth/Throat Q6H PRN Ezequiel Essex, MD       metoprolol tartrate (LOPRESSOR) tablet 12.5 mg  12.5 mg Oral BID Colletta [place], MD   12.5 mg at 08/02/22 1257   pantoprazole (PROTONIX) EC tablet 40 mg  40 mg Oral Daily [name], [name], MD   40 mg at 08/02/22 1257   polyethylene glycol (MIRALAX / GLYCOLAX) packet 17 g  17 g Oral Daily [name], [name], MD   17 g at 08/01/22 1053   sucroferric oxyhydroxide (VELPHORO) chewable tablet 1,000 mg  1,000 mg Oral TID WC Stephania Fragmin R, PA-C   1,000 mg at 08/02/22 1302   vancomycin (VANCOCIN) IVPB 1000 mg/200 mL premix  1,000 mg Intravenous Q M,W,F-HD Alvira Philips, RPH 200 mL/hr at 08/02/22 1308 1,000 mg at 08/02/22 1308     Discharge Medications: Please see discharge summary for a list of discharge medications.  Relevant Imaging Results:  Relevant Lab Results:   Additional Information [id number]  HD at [facility] on MWF   [place], [place]

## 2022-08-02 NOTE — TOC Initial Note (Addendum)
Transition of Care [facility]) - Initial/Assessment Note    Patient Details  Name: [name] MRN: [id number] Date of Birth: [birth year]  Transition of Care [facility]) CM/SW Contact:    Milas Gain, [place] Phone Number: 08/02/2022, 3:46 PM  Clinical Narrative:                  CSW received consult for possible SNF placement at time of discharge. Due to patients current orientation CSW spoke with patients daughter Robyne Askew regarding  SNF placement at time of discharge. Patients daughter reports PTA patient comes from home with son.Patients daughter expressed understanding of PT recommendation and is agreeable to SNF placement for patient at time of discharge. Patients daughter gave CSW permission to fax out initial referral near the [place] area for possible SNF placement. CSW discussed insurance authorization process and will provide Medicare SNF ratings list with accepted SNF bed offers when available.  No further questions reported at this time. CSW to continue to follow and assist with discharge planning needs.   Update- CSW had issues with navi portal, was unable to start auth through portal so CSW called patients insurance and started insurance authorization for patient. Reference # [id number].CSW faxed over clinicals to patients insurance for review. CSW will need to call patients insurance and add facility, once snf choice confirmed.   Expected Discharge Plan: Skilled Nursing Facility Barriers to Discharge: Continued Medical Work up   Patient Goals and CMS Choice Patient states their goals for this hospitalization and ongoing recovery are:: daughter would like inpatient rehab- aware that we may need to look at backup for snf. CMS Medicare.gov Compare Post Acute Care list provided to:: Patient Represenative (must comment) (patients daughter) Choice offered to / list presented to : Adult Children      Expected Discharge Plan and Services In-house Referral: Clinical Social Work Discharge  Planning Services: CM Consult Post Acute Care Choice: IP Rehab Living arrangements for the past 2 months: Apartment                   DME Agency: NA                  Prior Living Arrangements/Services Living arrangements for the past 2 months: Apartment Lives with:: Adult Children (Lives with son) Patient language and need for interpreter reviewed:: Yes        Need for Family Participation in Patient Care: Yes (Comment) Care giver support system in place?: Yes (comment)   Criminal Activity/Legal Involvement Pertinent to Current Situation/Hospitalization: No - Comment as needed  Activities of Daily Living Home Assistive Devices/Equipment: None ADL Screening (condition at time of admission) Patient's cognitive ability adequate to safely complete daily activities?: Yes Is the patient deaf or have difficulty hearing?: No Does the patient have difficulty seeing, even when wearing glasses/contacts?: No Does the patient have difficulty concentrating, remembering, or making decisions?: No Patient able to express need for assistance with ADLs?: Yes Does the patient have difficulty dressing or bathing?: No Independently performs ADLs?: Yes (appropriate for developmental age) Does the patient have difficulty walking or climbing stairs?: No Weakness of Legs: None Weakness of Arms/Hands: None  Permission Sought/Granted Permission sought to share information with : Case Manager, Family Supports, Chartered certified accountant granted to share information with : No  Share Information with NAME: Due to current orientation CSW spoke with patients daughter Robyne Askew  Permission granted to share info w AGENCY: Due to current orientation CSW spoke with patients daughter Canvis/SNF  Permission  granted to share info w Relationship: Due to current orientation CSW spoke with patients daughter Robyne Askew  Permission granted to share info w Contact Information: Due to current orientation CSW  spoke with patients daughter Canvis  Emotional Assessment Appearance:: Appears stated age     Orientation: : Oriented to Self, Oriented to Place, Oriented to  Time Alcohol / Substance Use: Not Applicable Psych Involvement: No (comment)  Admission diagnosis:  NSTEMI (non-ST elevated myocardial infarction) [I21.4] Multifocal pneumonia [J18.9] Patient Active Problem List   Diagnosis Date Noted   Localized swelling of chest wall 07/28/2022   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 07/28/2022   Acute bacterial endocarditis 07/26/2022   Stroke due to embolism ([place]) 07/24/2022   ESRD on dialysis 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 [place] ([place]) [place] [address] Phone: [phone] Fax: [phone]     Social Determinants of Health (SDOH) Social History: SDOH Screenings   Food Insecurity: No Food Insecurity (07/24/2022)  Housing: Low Risk  (07/24/2022)  Transportation Needs: No Transportation Needs (07/24/2022)  Utilities: Not At Risk (07/24/2022)  Alcohol Screen: Low Risk  (05/31/2021)  Depression (PHQ2-9): Low Risk  (05/02/2022)  Financial Resource Strain: Low Risk  (05/31/2021)  Physical Activity: Sufficiently Active (05/31/2021)  Social Connections: Moderately Integrated (05/31/2021)  Stress: No  Stress Concern Present (05/31/2021)  Tobacco Use: Medium Risk (07/28/2022)   SDOH Interventions:     Readmission Risk Interventions    07/29/2022    4:53 PM  Readmission Risk Prevention Plan  Transportation Screening Complete  HRI or Home Care Consult Complete  Social Work Consult for [place] Planning/Counseling Complete  Palliative Care Screening Not Applicable  Medication Review Press photographer) Referral to Pharmacy

## 2022-08-02 NOTE — Progress Notes (Signed)
[facility] Progress Note   Subjective:  Seen on HD - 1L UFG and tolerating. Denies CP or dyspnea.  Objective Vitals:   08/02/22 0830 08/02/22 0902 08/02/22 0930 08/02/22 0948  BP: 106/75 105/71 101/65 96/64  Pulse: 85 94 96   Resp: (!) 24 (!) 21 (!) 26   Temp:      TempSrc:      SpO2: 100% 100% 100%   Weight:      Height:       Physical Exam General: Ill appearing woman, NAD. Looks frail compared to her usual self. Heart: RRR; ?murmur Lungs: CTA anteriorly Abdomen: soft Extremities: No LE edema Dialysis Access:  LUE AVF + bruit  Additional Objective Labs: Basic Metabolic Panel: Recent Labs  Lab 07/29/22 0027 07/31/22 0524 08/02/22 0645  NA 129* 134* 133*  K 3.7 4.4 4.5  CL 90* 91* 91*  CO2 24 26 26   GLUCOSE 89 86 87  BUN 45* 37* 40*  CREATININE 10.28* 9.04* 8.40*  CALCIUM 8.8* 9.3 9.6  PHOS 6.9* 6.2* 5.7*   Liver Function Tests: Recent Labs  Lab 07/29/22 0027 07/31/22 0524 08/02/22 0645  ALBUMIN 2.9* 3.4* 3.4*   CBC: Recent Labs  Lab 07/27/22 0126 07/28/22 0245 07/29/22 0027 07/31/22 0540 08/02/22 0645  WBC 8.1 7.6 9.6 14.8* 12.9*  HGB 11.1* 10.2* 10.4* 11.7* 12.2  HCT 33.8* 31.9* 31.2* 37.2 38.0  MCV 85.8 87.2 85.7 88.8 88.4  PLT 296 327 379 456* 395   Blood Culture    Component Value Date/Time   SDES BLOOD SITE NOT SPECIFIED 07/26/2022 0840   SPECREQUEST  07/26/2022 0840    BOTTLES DRAWN AEROBIC AND ANAEROBIC Blood Culture adequate volume   CULT  07/26/2022 0840    NO GROWTH 5 DAYS Performed at [facility], [place] [address], [place], [place] [address]    REPTSTATUS 07/31/2022 FINAL 07/26/2022 0840   Medications:  vancomycin      aspirin EC  81 mg Oral Daily   atorvastatin  40 mg Oral Daily   Chlorhexidine Gluconate Cloth  6 each Topical Q0600   cinacalcet  90 mg Oral Q M,W,F-HD   darbepoetin (ARANESP) injection - DIALYSIS  25 mcg Subcutaneous Q Mon-1800   feeding supplement (NEPRO CARB STEADY)  237 mL Oral  BID BM   heparin  4,000 Units Dialysis Once in dialysis   heparin injection (subcutaneous)  5,000 Units Subcutaneous Q8H   heparin sodium (porcine)       heparin sodium (porcine)       isosorbide-hydrALAZINE  1 tablet Oral BID   lidocaine  1 patch Transdermal Q24H   metoprolol tartrate  12.5 mg Oral BID   pantoprazole  40 mg Oral Daily   polyethylene glycol  17 g Oral Daily   sucroferric oxyhydroxide  500 mg Oral TID WC    Dialysis Orders: MWF South 4hr, 400/500, EDW 93kg, 2K/2Ca bath, LUE AVF, heparin 4000u + 2000u mid-run bolus - Was getting course of IV iron - Sensipar 90mg  PO q HD - Mircera 30 mcg IV q 2 weeks - last 3/1, not given 3/15 due to Hgb > 11   Assessment/Plan: ESRD: Continue HD on usual MWF schedule - HD today. 1L UFG and tolerating. 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 NSTEMI: Cardiology following. LHC 3/21 negative, felt that acute HF was due to stress cardiomyopathy (Takotsubo). EF 35-40% on admit ->  back up to 60-65% on 07/26/22. Embolic stroke: In setting of #2. On ASA. Neuro was involved, now signed off and she transferred to CIR. Multifocal pneumonia: CXR w/ R sided infiltrates. On ABX.  HTN/volume: BP stable, no edema on exam - low UF goal today. May need to reduce/hold BiDil soon. Anemia of ESRD: Hgb > 12, will hold ESA. Secondary HPTH: CorrCa and Phos both slightly high, continue Velphoro - ^ dose. Continue sensipar.  Nutrition: Alb low, continue Nepro supplements.   Veneta Penton, PA-C 08/02/2022, 10:05 AM  [employer]

## 2022-08-03 DIAGNOSIS — I330 Acute and subacute infective endocarditis: Secondary | ICD-10-CM | POA: Diagnosis not present

## 2022-08-03 NOTE — Progress Notes (Signed)
     Daily Progress Note Intern Pager: [phone]  Patient name: [name] Medical record number: [id number] Date of birth: 1961-02-28 Age: 62 y.o. Gender: female  Primary Care Provider: Lowry Ram, MD Consultants: Cardiology, Nephrology, ID, Neurology, CTS  Code Status: Full Code  Pt Overview and Major Events to Date:  3/20 Admitted 3/21 [place] 3/22 TEE 3/23 CT coronary morph, CODE STROKE  Assessment and Plan:  Ms. Enright is a 62 year old woman with PMH of ESRD on HD MWF, HTN, T2DM, CVA, paroxysmal VT, HLD, GERD currently admitted for suspected acute bacterial endocarditis, initially presenting with pneumonia and NSTEMI.    Medically Stable for discharge to CIR.   * Acute bacterial endocarditis VSS. Afebrile. Cardiology recommends FDG PET CT to distinguish degenerative disease vs inflammation, however this scan is not available at [facility]. Corynebacterium, group JK, and Enterococcus Faecalis on repeat Bcx, susceptibilities show sensitive to Vancomycin. NSTEMI resolved but high risk for repeat cardiac event. - ID consulted, recs appreciated - Cardiology consulted, appreciate recs - CTS consulted, recs appreciated - not a surgical candidate - Continue Vancomycin 1000 mg MWF, 05/01  Stroke due to embolism [facility]) Neuro status stable. - Appreciate ongoing care by stroke team - Cleared by SLP - Aspirin, subQ heparin - Neuro checks   Localized swelling of chest wall Uncertain chronicity. Chest xray no acute bony abnormality. No sign of erythema or drainage. CT negative. -Continue to monitor -Consider mandibular chest x ray view -Consider soft tissue US  ESRD on dialysis ([place]) HD on MWF. Renal function at baseline reassuringly despite repeat contrast. -Nephrology consulted recs, appreciated -Electrolyte management per dialysis  -Velphoro, Sensipar  Multifocal pneumonia-resolved as of 08/01/2022 Respiratory status stable. - Completed empiric antibiotic course    FEN/GI:  Heart Healthy, Carb modified PPx: ASA, Sub Q Heparin Dispo:SNF  Pending insurance authorization .    Subjective:  Doing well this morning, no complaints, awake watching TV  Objective: Temp:  [97.7 F (36.5 C)-98.5 F (36.9 C)] 98.5 F (36.9 C) (03/30 0444) Pulse Rate:  [85-101] 93 (03/30 0444) Resp:  [17-27] 17 (03/30 0444) BP: (90-114)/(51-83) 97/56 (03/30 0444) SpO2:  [99 %-100 %] 100 % (03/30 0444) Physical Exam: General: NAD, elderly, frail, ill appearing Cardiovascular: S1/S2, RRR, NRMG Respiratory: CTABL, no wheezes or rales Abdomen: Soft, NTTP, non-distended Extremities: Moving all extremities, no edema  Laboratory: Most recent CBC Lab Results  Component Value Date   WBC 12.9 (H) 08/02/2022   HGB 12.2 08/02/2022   HCT 38.0 08/02/2022   MCV 88.4 08/02/2022   PLT 395 08/02/2022   Most recent BMP    Latest Ref Rng & Units 08/02/2022    6:45 AM  BMP  Glucose 70 - 99 mg/dL 87   BUN 8 - 23 mg/dL 40   Creatinine 0.44 - 1.00 mg/dL 8.40   Sodium 135 - 145 mmol/L 133   Potassium 3.5 - 5.1 mmol/L 4.5   Chloride 98 - 111 mmol/L 91   CO2 22 - 32 mmol/L 26   Calcium 8.9 - 10.3 mg/dL 9.6      Holley Bouche, MD 08/03/2022, 6:23 AM  PGY-2, [place] Intern pager: [phone], text pages welcome Secure chat group [place]

## 2022-08-03 NOTE — Progress Notes (Addendum)
 Daily Progress Note Intern Pager: [phone]  Patient name: [name] Medical record number: [id number] Date of birth: 07/09/1960 Age: 62 y.o. Gender: female  Primary Care Provider: Nicholas Bar, MD Consultants: Cardiology, Nephrology, ID, Neurology, CTS  Code Status: Full Code  Pt Overview and Major Events to Date:  3/20 Admitted 3/21 [facility] 3/22 TEE 3/23 CT coronary morph, CODE STROKE   Assessment and Plan: [name] is a 62 year old woman with PMH of ESRD on HD MWF, HTN, T2DM, CVA, paroxysmal VT, HLD, GERD currently admitted for suspected acute bacterial endocarditis, initially presenting with pneumonia and NSTEMI.   She is medically stable for discharge to SNF.  * Acute bacterial endocarditis VSS. Afebrile. Cardiology recommends FDG PET CT to distinguish degenerative disease vs inflammation, however this scan is not available at [facility]. - CTS consulted, recs appreciated - not a surgical candidate - Continue Vancomycin  1000 mg MWF, 05/01 per infectious disease  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) Appears to be at her baseline, awakens and she is able to answer my questions.  Although, she does think that it is Monday and she appears to have some confusion - Aspirin , subQ heparin  -Continue delirium precautions  Hypertension associated with diabetes (HCC) Multiple low BP overnight, as low as 78/56.  MAP still > 65. -Reduce dose of  isosorbide -hydralazine  (vital) 20-37.5 mg from 1 tablet to 1/2 TABLET BID -Holding home Amlodipine   Localized swelling of chest wall Uncertain chronicity. Chest xray no acute bony abnormality. No sign of erythema or drainage. CT negative. -Continue to monitor (can consider imaging if persistent or changes in symptoms)  ESRD on dialysis [facility]) HD on MWF, next HD session on 4/1. Renal function at baseline reassuringly despite repeat contrast. -Nephrology following -Velphoro , Sensipar   Multifocal pneumonia-resolved as of 08/01/2022 Respiratory  status stable. - Completed empiric antibiotic course     FEN/GI: Heart healthy PPx: Heparin , 5000 units SQ every 8 hours Dispo:SNF  tomorrow or the next day . Barriers include placement.   Subjective:  [name] has no complaints this morning.  When asked her location, she says hospital and when asked what day the week it is she says Monday.  Due to pain anywhere.  Objective: Temp:  [97.9 F (36.6 C)-98.6 F (37 C)] 97.9 F (36.6 C) (03/30 1910) Pulse Rate:  [91-112] 93 (03/31 0501) Resp:  [17-18] 18 (03/31 0501) BP: (77-106)/(54-81) 82/62 (03/31 0501) SpO2:  [98 %-100 %] 100 % (03/31 0501) Physical Exam: General: Pleasant, no distress, laying in bed Cardiovascular: Regular rate and rhythm Respiratory: Normal work of breathing on room air, clear to auscultation anteriorly Abdomen: Soft, nontender nondistended Extremities: No edema appreciated  Laboratory: Most recent CBC Lab Results  Component Value Date   WBC 12.9 (H) 08/02/2022   HGB 12.2 08/02/2022   HCT 38.0 08/02/2022   MCV 88.4 08/02/2022   PLT 395 08/02/2022   Most recent BMP    Latest Ref Rng & Units 08/02/2022    6:45 AM  BMP  Glucose 70 - 99 mg/dL 87   BUN 8 - 23 mg/dL 40   Creatinine 9.55 - 1.00 mg/dL 1.59   Sodium 864 - 854 mmol/L 133   Potassium 3.5 - 5.1 mmol/L 4.5   Chloride 98 - 111 mmol/L 91   CO2 22 - 32 mmol/L 26   Calcium  8.9 - 10.3 mg/dL 9.6    Dartha Geralds, DO 08/04/2022, 7:17 AM  PGY-2, [facility] FPTS Intern pager: [phone], text pages welcome  Secure chat group [facility] Teaching Service

## 2022-08-03 NOTE — Progress Notes (Signed)
  [facility] Progress Note   Subjective:  Seen in room - awake, says "yea" to all my questioning. Denies CP or dyspnea.  Objective Vitals:   08/02/22 2352 08/03/22 0444 08/03/22 0749 08/03/22 0818  BP: 92/60 (!) 97/56 (!) 92/58 106/63  Pulse: 85 93 93 97  Resp: 18 17 18    Temp: 97.9 F (36.6 C) 98.5 F (36.9 C) 98.2 F (36.8 C)   TempSrc: Oral Oral Oral   SpO2: 100% 100% 100% 100%  Weight:      Height:       Physical Exam General: Ill appearing woman, NAD. Frail appearing compared to her usual self. Heart: RRR; ?murmur Lungs: CTA anteriorly Abdomen: soft Extremities: No LE edema Dialysis Access:  LUE AVF + bruit  Additional Objective Labs: Basic Metabolic Panel: Recent Labs  Lab 07/29/22 0027 07/31/22 0524 08/02/22 0645  NA 129* 134* 133*  K 3.7 4.4 4.5  CL 90* 91* 91*  CO2 24 26 26   GLUCOSE 89 86 87  BUN 45* 37* 40*  CREATININE 10.28* 9.04* 8.40*  CALCIUM 8.8* 9.3 9.6  PHOS 6.9* 6.2* 5.7*   Liver Function Tests: Recent Labs  Lab 07/29/22 0027 07/31/22 0524 08/02/22 0645  ALBUMIN 2.9* 3.4* 3.4*   CBC: Recent Labs  Lab 07/28/22 0245 07/29/22 0027 07/31/22 0540 08/02/22 0645  WBC 7.6 9.6 14.8* 12.9*  HGB 10.2* 10.4* 11.7* 12.2  HCT 31.9* 31.2* 37.2 38.0  MCV 87.2 85.7 88.8 88.4  PLT 327 379 456* 395   Medications:  vancomycin 1,000 mg (08/02/22 1308)    aspirin EC  81 mg Oral Daily   atorvastatin  40 mg Oral Daily   cinacalcet  90 mg Oral Q M,W,F-HD   feeding supplement (NEPRO CARB STEADY)  237 mL Oral BID BM   heparin injection (subcutaneous)  5,000 Units Subcutaneous Q8H   isosorbide-hydrALAZINE  1 tablet Oral BID   lidocaine  1 patch Transdermal Q24H   metoprolol tartrate  12.5 mg Oral BID   pantoprazole  40 mg Oral Daily   polyethylene glycol  17 g Oral Daily   sucroferric oxyhydroxide  1,000 mg Oral TID WC    Dialysis Orders: MWF South 4hr, 400/500, EDW 93kg, 2K/2Ca bath, LUE AVF, heparin 4000u + 2000u mid-run  bolus - Was getting course of IV iron - Sensipar 90mg  PO q HD - Mircera 30 mcg IV q 2 weeks - last 3/1, not given 3/15 due to Hgb > 11   Assessment/Plan: ESRD: Continue HD on usual MWF schedule - next HD 4/1. 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 NSTEMI: Cardiology following. LHC 3/21 negative, felt that acute HF was due to stress cardiomyopathy (Takotsubo). EF 35-40% on admit -> back up to 60-65% on 07/26/22. Embolic stroke: In setting of #2. On ASA. Neuro was involved, now signed off and she transferred to CIR. Improved MS, but not to baseline. Multifocal pneumonia: CXR w/ R sided infiltrates. On ABX.  HTN/volume: BP stable, no edema on exam. May need to reduce/hold BiDil soon. 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 Secondary HPTH: CorrCa and Phos both slightly high, continue Velphoro - ^ dose. Continue sensipar.  Nutrition: Alb low, continue Nepro supplements.  Veneta Penton, PA-C 08/03/2022, 9:46 AM  [employer]

## 2022-08-04 DIAGNOSIS — I330 Acute and subacute infective endocarditis: Secondary | ICD-10-CM | POA: Diagnosis not present

## 2022-08-04 LAB — GLUCOSE, CAPILLARY: Glucose-Capillary: 91 mg/dL (ref 70–99)

## 2022-08-04 MED ORDER — LACTATED RINGERS IV BOLUS
250.0000 mL | Freq: Once | INTRAVENOUS | Status: AC
  Administered 2022-08-04: 250 mL via INTRAVENOUS

## 2022-08-04 MED ORDER — ISOSORB DINITRATE-HYDRALAZINE 20-37.5 MG PO TABS: 1.0000 | ORAL_TABLET | Freq: Two times a day (BID) | ORAL | Status: DC

## 2022-08-04 MED ORDER — ISOSORB DINITRATE-HYDRALAZINE 20-37.5 MG PO TABS: 1.0000 | ORAL_TABLET | Freq: Every day | ORAL | Status: DC

## 2022-08-04 MED ORDER — ISOSORB DINITRATE-HYDRALAZINE 20-37.5 MG PO TABS
0.5000 | ORAL_TABLET | Freq: Two times a day (BID) | ORAL | Status: DC
  Administered 2022-08-04: 0.5 via ORAL
  Filled 2022-08-04: qty 1

## 2022-08-04 NOTE — Progress Notes (Signed)
Pharmacy Antibiotic Note  [name] is a 62 y.o. female admitted on 07/23/2022 with  unwitnessed fall, abd pain, multifocal pneumonia on CT scan .  Pharmacy has been consulted for vancomycin dosing.  Pt has ESRD with HD MWF, in HD currently.  Plan: Vancomycin 1 g IV following each HD (MWF) F/U HD schedule and toleration Plan is for 6 weeks of antibiotics (end date of 05/01) Consider pre-dialysis level this week   Height: 5\' 10"  (177.8 cm) Weight: 90.2 kg (198 lb 13.7 oz) IBW/kg (Calculated) : 68.5  Temp (24hrs), Avg:98 F (36.7 C), Min:97.6 F (36.4 C), Max:98.6 F (37 C)  Recent Labs  Lab 07/29/22 0027 07/31/22 0524 07/31/22 0540 08/02/22 0645  WBC 9.6  --  14.8* 12.9*  CREATININE 10.28* 9.04*  --  8.40*  VANCORANDOM  --  33  --   --      Estimated Creatinine Clearance: 8.4 mL/min (A) (by C-G formula based on SCr of 8.4 mg/dL (H)).    Allergies  Allergen Reactions   Lisinopril Anaphylaxis and Other (See Comments)    angioedema   Venofer  [Ferric Oxide]     Other reaction(s): Back Pain   Camellia Swelling and Other (See Comments)    Angioedema    Jardiance [Empagliflozin] Swelling and Rash    Antimicrobials this admission: Rocephin 3/19 >> 3/23 Azithro 3/19 >> 3/21 Vanc 3/20 >>  Dose adjustments this admission: 3/27 preHD lvl 33 - vanc 500 mg x1  Microbiology results: 3/20 BCx: 2/4 GPR-corynebacterium JK + e. Faecalis (amp sens) + Staph capitis 3/19 COVID, Flu, RSV neg 3/20 MRSA PCR neg 3/22 BCx: NGTD 3/23 BCID: staph species  Thank you for involving pharmacy in this patient's care.  Francena Hanly, PharmD Pharmacy Resident  08/04/2022 7:52 AM

## 2022-08-04 NOTE — Progress Notes (Signed)
FMTS Interim Progress Note  S:Went to bedside to check on patient alongside Dr. Marcina Millard. Patient was resting in bed comfortably, I did not disturb her.   O: BP 100/59 (BP Location: Right Wrist)   Pulse 82  F (36.3 C) (Axillary)   Resp 16   Ht 5\' 10"  (1.778 m)   Wt 90.2 kg   LMP  (LMP Unknown)   SpO2 100%   BMI 28.53 kg/m   General: Patient resting comfortably in bed, in no acute distress. Resp: normal work of breathing noted without signs of respiratory distress, breathing comfortably on room air  A/P: Hypotension Patient seems to be experiencing worsening hypotension since yesterday, although she has some degree of baseline lower blood pressures. S/p 250 mL bolus x2. Metoprolol held and will continue to hold. MAP improved, will continue to closely monitor BP. Plan for possibly another 250 mL bolus, however will monitor volume status closely given history of ESRD, next HD session scheduled for tomorrow. If MAPs continue to decrease and patient in need of vasopressor, then will contact CCM if appropriate. Remainder of plan per day, awaiting SNF placement.   Donney Dice, DO 08/04/2022, 7:57 PM PGY-3, [place] Medicine Service pager [phone]

## 2022-08-04 NOTE — Plan of Care (Signed)
Problem: Education: Goal: Knowledge of General Education information will improve Description: Including pain rating scale, medication(s)/side effects and non-pharmacologic comfort measures Outcome: Not Progressing   Problem: Health Behavior/Discharge Planning: Goal: Ability to manage health-related needs will improve Outcome: Not Progressing   Problem: Clinical Measurements: Goal: Ability to maintain clinical measurements within normal limits will improve Outcome: Not Progressing Goal: Will remain free from infection Outcome: Not Progressing Goal: Diagnostic test results will improve Outcome: Not Progressing Goal: Respiratory complications will improve Outcome: Not Progressing Goal: Cardiovascular complication will be avoided Outcome: Not Progressing   Problem: Activity: Goal: Risk for activity intolerance will decrease Outcome: Not Progressing   Problem: Nutrition: Goal: Adequate nutrition will be maintained Outcome: Not Progressing   Problem: Coping: Goal: Level of anxiety will decrease Outcome: Not Progressing   Problem: Elimination: Goal: Will not experience complications related to bowel motility Outcome: Not Progressing Goal: Will not experience complications related to urinary retention Outcome: Not Progressing   Problem: Pain Managment: Goal: General experience of comfort will improve Outcome: Not Progressing   Problem: Safety: Goal: Ability to remain free from injury will improve Outcome: Not Progressing   Problem: Skin Integrity: Goal: Risk for impaired skin integrity will decrease Outcome: Not Progressing   Problem: Education: Goal: Understanding of CV disease, CV risk reduction, and recovery process will improve Outcome: Not Progressing Goal: Individualized Educational Video(s) Outcome: Not Progressing   Problem: Activity: Goal: Ability to return to baseline activity level will improve Outcome: Not Progressing   Problem:  Cardiovascular: Goal: Ability to achieve and maintain adequate cardiovascular perfusion will improve Outcome: Not Progressing Goal: Vascular access site(s) Level 0-1 will be maintained Outcome: Not Progressing   Problem: Health Behavior/Discharge Planning: Goal: Ability to safely manage health-related needs after discharge will improve Outcome: Not Progressing   Problem: Coping: Goal: Ability to adjust to condition or change in health will improve Outcome: Not Progressing   Problem: Education: Goal: Ability to describe self-care measures that may prevent or decrease complications (Diabetes Survival Skills Education) will improve Outcome: Not Progressing Goal: Individualized Educational Video(s) Outcome: Not Progressing   Problem: Fluid Volume: Goal: Ability to maintain a balanced intake and output will improve Outcome: Not Progressing   Problem: Health Behavior/Discharge Planning: Goal: Ability to identify and utilize available resources and services will improve Outcome: Not Progressing Goal: Ability to manage health-related needs will improve Outcome: Not Progressing   Problem: Metabolic: Goal: Ability to maintain appropriate glucose levels will improve Outcome: Not Progressing   Problem: Nutritional: Goal: Maintenance of adequate nutrition will improve Outcome: Not Progressing Goal: Progress toward achieving an optimal weight will improve Outcome: Not Progressing   Problem: Skin Integrity: Goal: Risk for impaired skin integrity will decrease Outcome: Not Progressing   Problem: Tissue Perfusion: Goal: Adequacy of tissue perfusion will improve Outcome: Not Progressing   Problem: Fluid Volume: Goal: Hemodynamic stability will improve Outcome: Not Progressing   Problem: Clinical Measurements: Goal: Diagnostic test results will improve Outcome: Not Progressing Goal: Signs and symptoms of infection will decrease Outcome: Not Progressing   Problem:  Respiratory: Goal: Ability to maintain adequate ventilation will improve Outcome: Not Progressing   Problem: Education: Goal: Knowledge of disease and its progression will improve Outcome: Not Progressing Goal: Individualized Educational Video(s) Outcome: Not Progressing   Problem: Fluid Volume: Goal: Compliance with measures to maintain balanced fluid volume will improve Outcome: Not Progressing   Problem: Health Behavior/Discharge Planning: Goal: Ability to manage health-related needs will improve Outcome: Not Progressing   Problem: Nutritional: Goal: Ability to  make healthy dietary choices will improve Outcome: Not Progressing   Problem: Clinical Measurements: Goal: Complications related to the disease process, condition or treatment will be avoided or minimized Outcome: Not Progressing

## 2022-08-04 NOTE — Progress Notes (Signed)
  [facility] Progress Note   Subjective:  Seen in room - breakfast in front of her, but not eating and she says she is not hungry, encouraged. Hypotensive this AM - BP meds held. She remains with flat affect, far from prior bubbly baseline personality.  Objective Vitals:   08/04/22 0040 08/04/22 0240 08/04/22 0501 08/04/22 0736  BP: (!) 90/54 (!) 88/59 (!) 82/62 (!) 92/59  Pulse: 94 98 93 92  Resp:  18 18 16   Temp:    97.6 F (36.4 C)  TempSrc:    Axillary  SpO2: 100% 98% 100% 100%  Weight:      Height:       Physical Exam General: Ill appearing woman, NAD. Heart: RRR; ?murmur Lungs: CTA anteriorly Abdomen: soft Extremities: No LE edema Dialysis Access:  LUE AVF + bruit  Additional Objective Labs: Basic Metabolic Panel: Recent Labs  Lab 07/29/22 0027 07/31/22 0524 08/02/22 0645  NA 129* 134* 133*  K 3.7 4.4 4.5  CL 90* 91* 91*  CO2 24 26 26   GLUCOSE 89 86 87  BUN 45* 37* 40*  CREATININE 10.28* 9.04* 8.40*  CALCIUM 8.8* 9.3 9.6  PHOS 6.9* 6.2* 5.7*   Liver Function Tests: Recent Labs  Lab 07/29/22 0027 07/31/22 0524 08/02/22 0645  ALBUMIN 2.9* 3.4* 3.4*   CBC: Recent Labs  Lab 07/29/22 0027 07/31/22 0540 08/02/22 0645  WBC 9.6 14.8* 12.9*  HGB 10.4* 11.7* 12.2  HCT 31.2* 37.2 38.0  MCV 85.7 88.8 88.4  PLT 379 456* 395   Medications:  vancomycin 1,000 mg (08/02/22 1308)    aspirin EC  81 mg Oral Daily   atorvastatin  40 mg Oral Daily   cinacalcet  90 mg Oral Q M,W,F-HD   feeding supplement (NEPRO CARB STEADY)  237 mL Oral BID BM   heparin injection (subcutaneous)  5,000 Units Subcutaneous Q8H   isosorbide-hydrALAZINE  0.5 tablet Oral BID   lidocaine  1 patch Transdermal Q24H   metoprolol tartrate  12.5 mg Oral BID   pantoprazole  40 mg Oral Daily   polyethylene glycol  17 g Oral Daily   sucroferric oxyhydroxide  1,000 mg Oral TID WC    Dialysis Orders: [facility]WF [facility] 4hr, 400/500, EDW 93kg, 2K/2Ca bath, LUE AVF, heparin 4000u  + 2000u mid-run bolus - Was getting course of IV iron - Sensipar 90mg  PO q HD - Mircera 30 mcg IV q 2 weeks - last 3/1, not given 3/15 due to Hgb > 11   Assessment/Plan: ESRD: Continue HD on usual MWF schedule - next HD 4/1. 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 NSTEMI: Cardiology following. LHC 3/21 negative, felt that acute HF was due to stress cardiomyopathy (Takotsubo). EF 35-40% on admit -> back up to 60-65% on 07/26/22. Embolic stroke: In setting of #2. On ASA. Neuro was involved, now signed off and she transferred to CIR. Improved MS, but not to baseline. Multifocal pneumonia: CXR w/ R sided infiltrates. On ABX.  HTN/volume: BP low, no edema on exam. BiDil dose reduced this AM, probably needs to be discontinued. Remains on low dose metoprolol. Not eating much. 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 Secondary HPTH: CorrCa and Phos both slightly high, continue Velphoro - not eating much. Continue sensipar.  Nutrition: Alb low, continue Nepro supplements.  Veneta Penton, PA-C 08/04/2022, 9:24 AM  [employer]

## 2022-08-04 NOTE — Assessment & Plan Note (Signed)
Multiple low BP overnight, as low as 78/56.  MAP still > 65. -Reduce dose of  isosorbide-hydralazine (vital) 20-37.5 mg from 1 tablet to 1/2 TABLET BID -Holding home Amlodipine

## 2022-08-04 NOTE — Assessment & Plan Note (Deleted)
Multiple low BP overnight, as low as 78/56.  MAP still > 65. -Reduce dose of  isosorbide-hydralazine (vital) 20-37.5 mg from 1 tablet to 1/2 TABLET BID

## 2022-08-04 NOTE — Progress Notes (Addendum)
2000 patient alert to place self and year able to make needs known no pain noted BP monitoring q30 minutes for hypotension goal MAP 65. Patient states she is unaware of what her BP runs normally or at hemodialysis. Patient unable to lift legs from gravity at this time reported that patient was total care. Patient not eating only ate eggs today food at bedside patient states she's not hungry. 1030 patient has pain patch ordered lidocaine, patient unsure why she's getting patch unaware that she had one prior MAR doesn't indicate where to place it notified pharmacy

## 2022-08-04 NOTE — Progress Notes (Addendum)
FMTS Interim Progress Note  S: Received message from nurse that patient has been hypotensive and somnolent throughout the day.  Went to bedside to assess with Dr. Nancy Fetter.  She opens her eyes to voice and touch, but is unable to speak coherently.  RN at bedside reports this is part of her normal waxing and waning symptoms.  Although most days she is able to perk up and have social interactions.  O: BP (!) 75/45 (BP Location: Right Wrist)   Pulse 82   Temp (!) 97.4 F (36.3 C) (Axillary)   Resp 16   Ht 5\' 10"  (1.778 m)   Wt 90.2 kg   LMP  (LMP Unknown)   SpO2 100%   BMI 28.53 kg/m   General: Somnolent, chronically ill-appearing, no acute distress Cards: Regular rate, regular rhythm Abdomen: Mildly distended, nontender Extremities: No peripheral edema, squeezes hands when prompted bilaterally with equal grip strength  A/P: Hypotension: On chart review pressures have been trending down and have been low for quite some time. -Will order 250 cc bolus and attempt to avoid midodrine -Hold home metoprolol -BiDil discontinued  Somnolence: Difficult to differentiate between normal waxing and waning symptoms versus a new onset/acute worsening.  She does not appear to have any focal deficits at this time requiring advanced imaging.   -Due to her somnolence will order SLP eval for swallowing. -Recheck for mental status changes later today   Addendum:  Called and spoke with daughter, Canvis. Discussed worsening blood pressures throughout the day that may require additional medication support either oral or through the IV. Canvis would like her mother's care escalated to ICU level if that is required overnight and wants all measures done.    Hypotension:  - ordered second 250 cc bolus  - per RN patient has not taken anything PO since 1000 am this morning when she was more alert.  - will probably need IV blood pressure support overnight  Darci Current, DO 08/04/2022, 4:21 PM PGY-1, [facility] pager [phone]

## 2022-08-04 NOTE — Progress Notes (Addendum)
FMTS Brief Progress Note  S:Paged by RN about hypotension (83/59) and patient complaining of some abdominal pain.  Went to bedside to assess patient.  She was sleeping comfortably when I had entered the room.  I awakened patient and asked her if she was in any pain.  She says she denies any pain.  Denies any chest pain.  Denies any nausea/vomiting.  She says her last bowel movement was yesterday morning and it was normal.  O: BP (!) 83/59   Pulse 91   Temp 97.9 F (36.6 C) (Oral)   Resp 17   Ht 5\' 10"  (1.778 m)   Wt 90.2 kg   LMP  (LMP Unknown)   SpO2 100%   BMI 28.53 kg/m    Gen: NAD, comfortable appearing, alert and responsive to all questions CV: RRR, no murmurs or gallops Abdomen: Soft, nondistended, normoactive bowel sounds, mildly tender throughout abdomen, no rebound or gaurding  A/P: Hypotension Reviewed patient's chart and does seem to have some hypotension down to 70s/50s on prior nights.  She is asymptomatic as she is alert and responsive to all questions.  Does not have any respiratory distress on RA.  Likely a component of renal vasoplegia. Also received BiDil tonight. -Keep MAP above 65 -Can consider midodrine if worsening, consider reducing BiDil  Abdominal Pain Per chart review seems to have component of chronic abdominal pain. 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 Reassuring that patient's abdomen was soft and she was comfortable when I was talking with her. She has been eating and drinking normally without nausea or vomiting. Last BM yesterday. Unlikely ileus/obstructive picture. -tylenol prn -capsaicin cream prn -lidocaine patch   Gerrit Heck, MD 08/04/2022, 12:41 AM PGY-2, [facility] Night Resident  Please page [phone] with questions.

## 2022-08-04 NOTE — TOC Progression Note (Signed)
Transition of Care [facility]) - Progression Note    Patient Details  Name: [name] MRN: [id number] Date of Birth: 1961-01-07  Transition of [facility] CM/SW [place], LCSW Phone Number: 08/04/2022, 3:22 PM  Clinical Narrative:     CSW awaiting to see if more bed offer come back due to referral being sent out Friday afternoon. Pt is not on EDD board for tomorrow.  TOC team will continue to assist with discharge planning needs.    Expected Discharge Plan: [place] Barriers to Discharge: Continued Medical Work up  Expected Discharge Plan and Services In-house Referral: Clinical Social Work Discharge Planning Services: CM Consult Post Acute Care Choice: IP Rehab Living arrangements for the past 2 months: Apartment                   DME Agency: NA                   Social Determinants of Health (SDOH) Interventions SDOH Screenings   Food Insecurity: No Food Insecurity (07/24/2022)  Housing: Low Risk  (07/24/2022)  Transportation Needs: No Transportation Needs (07/24/2022)  Utilities: Not At Risk (07/24/2022)  Alcohol Screen: Low Risk  (05/31/2021)  Depression (PHQ2-9): Low Risk  (05/02/2022)  Financial Resource Strain: Low Risk  (05/31/2021)  Physical Activity: Sufficiently Active (05/31/2021)  Social Connections: Moderately Integrated (05/31/2021)  Stress: No Stress Concern Present (05/31/2021)  Tobacco Use: Medium Risk (07/28/2022)    Readmission Risk Interventions   Row Labels 07/29/2022    4:53 PM  Readmission Risk Prevention Plan   Section Header. No data exists in this row.   Transportation Screening   Complete  HRI or Home Care Consult   Complete  Social Work Consult for [place] Planning/Counseling   Complete  Palliative Care Screening   Not Applicable  Medication Review [employer])   Referral to Pharmacy

## 2022-08-05 DIAGNOSIS — I330 Acute and subacute infective endocarditis: Secondary | ICD-10-CM | POA: Diagnosis not present

## 2022-08-05 LAB — RENAL FUNCTION PANEL
Albumin: 3.2 g/dL — ABNORMAL LOW (ref 3.5–5.0)
Anion gap: 20 — ABNORMAL HIGH (ref 5–15)
BUN: 56 mg/dL — ABNORMAL HIGH (ref 8–23)
CO2: 25 mmol/L (ref 22–32)
Calcium: 9.6 mg/dL (ref 8.9–10.3)
Chloride: 93 mmol/L — ABNORMAL LOW (ref 98–111)
Creatinine, Ser: 10.72 mg/dL — ABNORMAL HIGH (ref 0.44–1.00)
GFR, Estimated: 4 mL/min — ABNORMAL LOW (ref >60)
Glucose, Bld: 68 mg/dL — ABNORMAL LOW (ref 70–99)
Phosphorus: 7.1 mg/dL — ABNORMAL HIGH (ref 2.5–4.6)
Potassium: 4.8 mmol/L (ref 3.5–5.1)
Sodium: 138 mmol/L (ref 135–145)

## 2022-08-05 LAB — CBC
HCT: 35.7 % — ABNORMAL LOW (ref 36.0–46.0)
Hemoglobin: 11.2 g/dL — ABNORMAL LOW (ref 12.0–15.0)
MCH: 28.4 pg (ref 26.0–34.0)
MCHC: 31.4 g/dL (ref 30.0–36.0)
MCV: 90.6 fL (ref 80.0–100.0)
Platelets: 316 10*3/uL (ref 150–400)
RBC: 3.94 MIL/uL (ref 3.87–5.11)
RDW: 16.2 % — ABNORMAL HIGH (ref 11.5–15.5)
WBC: 10.2 10*3/uL (ref 4.0–10.5)
nRBC: 0 % (ref 0.0–0.2)

## 2022-08-05 LAB — MISC LABCORP TEST (SEND OUT): Labcorp test code: 88021

## 2022-08-05 MED ORDER — HEPARIN SODIUM (PORCINE) 1000 UNIT/ML DIALYSIS
4000.0000 [IU] | Freq: Once | INTRAMUSCULAR | Status: AC
  Administered 2022-08-05: 4000 [IU] via INTRAVENOUS_CENTRAL
  Filled 2022-08-05 (×2): qty 4

## 2022-08-05 MED ORDER — MIDODRINE HCL 5 MG PO TABS
5.0000 mg | ORAL_TABLET | Freq: Three times a day (TID) | ORAL | Status: DC
  Administered 2022-08-05 – 2022-08-08 (×8): 5 mg via ORAL
  Filled 2022-08-05 (×9): qty 1

## 2022-08-05 NOTE — Progress Notes (Signed)
Physical Therapy Treatment Patient Details Name: [name] MRN: [id number] DOB: 22-Sep-1960 Today's Date: 08/05/2022   History of Present Illness 62 y.o. female presents to [facility] hospital on 07/23/2022 with chest pain after being found down at home, found to have NSTEMI and PNA. Code stroke called 3/23 and MRI revealed multiple scattered subcentimeter acute ischemic infarcts  involving the bilateral cerebral hemispheres and pons.   PMH includes DM, ESRD, GERD, HLD, HTN.    PT Comments    Pt making slow progress. Sitting balance slightly improved from last visit. Remains slow to initiate and difficulty with motor planning. Patient will benefit from continued inpatient follow up therapy, <3 hours/day    Recommendations for follow up therapy are one component of a multi-disciplinary discharge planning process, led by the attending physician.  Recommendations may be updated based on patient status, additional functional criteria and insurance authorization.  Follow Up Recommendations  Can patient physically be transported by private vehicle: No    Assistance Recommended at Discharge Frequent or constant Supervision/Assistance  Patient can return home with the following Two people to help with walking and/or transfers;Two people to help with bathing/dressing/bathroom;Assist for transportation;Help with stairs or ramp for entrance;Direct supervision/assist for financial management;Direct supervision/assist for medications management;Assistance with cooking/housework   Equipment Recommendations  Wheelchair (measurements PT);Wheelchair cushion (measurements PT);Other (comment);Hospital bed (hoyer lift)    Recommendations for Other Services       Precautions / Restrictions Precautions Precautions: Fall Precaution Comments: watch BP     Mobility  Bed Mobility Overal bed mobility: Needs Assistance Bed Mobility: Sidelying to Sit, Sit to Supine   Sidelying to sit: +2 for physical assistance,  Total assist   Sit to supine: +2 for physical assistance, Total assist   General bed mobility comments: Assist with all aspects. Pt not able to assist with movement of LE's and needs hand over hand assist to move place arms in a position to possibly assist.    Transfers                   General transfer comment: Will need mechanical lift    Ambulation/Gait                   Stairs             Wheelchair Mobility    Modified Rankin (Stroke Patients Only) Modified Rankin (Stroke Patients Only) Pre-Morbid Rankin Score: No significant disability Modified Rankin: Severe disability     Balance Overall balance assessment: Needs assistance Sitting-balance support: Bilateral upper extremity supported, Feet supported Sitting balance-Leahy Scale: Poor Sitting balance - Comments: Pt sat EOB x 10 minutes with min to mod assist. Pt using LUE to prop. Attempted reaching in sitting with pt needing hand over hand to perform. Postural control: Posterior lean, Left lateral lean                                  Cognition Arousal/Alertness: Awake/alert Behavior During Therapy: Flat affect Overall Cognitive Status: Impaired/Different from baseline Area of Impairment: Attention, Following commands, Problem solving, Awareness                   Current Attention Level: Sustained   Following Commands: Follows one step commands inconsistently, Follows one step commands with increased time Safety/Judgement: Decreased awareness of safety, Decreased awareness of deficits Awareness: Intellectual Problem Solving: Slow processing, Decreased initiation, Difficulty sequencing, Requires verbal cues, Requires  tactile cues General Comments: Pt with very slow processing and appears to have significant problems with motor planning.        Exercises      General Comments        Pertinent Vitals/Pain Pain Assessment Pain Assessment: Faces Faces Pain  Scale: Hurts little more Pain Location: rt heel to touch Pain Descriptors / Indicators: Grimacing, Guarding Pain Intervention(s): Monitored during session, Repositioned    Home Living                          Prior Function            PT Goals (current goals can now be found in the care plan section) Progress towards PT goals: Progressing toward goals    Frequency    Min 1X/week      PT Plan Current plan remains appropriate    Co-evaluation              AM-PAC PT "6 Clicks" Mobility   Outcome Measure  Help needed turning from your back to your side while in a flat bed without using bedrails?: Total Help needed moving from lying on your back to sitting on the side of a flat bed without using bedrails?: Total Help needed moving to and from a bed to a chair (including a wheelchair)?: Total Help needed standing up from a chair using your arms (e.g., wheelchair or bedside chair)?: Total Help needed to walk in hospital room?: Total Help needed climbing 3-5 steps with a railing? : Total 6 Click Score: 6    End of Session   Activity Tolerance: Patient limited by fatigue Patient left: in bed;with call bell/phone within reach;with bed alarm set Nurse Communication: Mobility status;Need for lift equipment PT Visit Diagnosis: Other abnormalities of gait and mobility (R26.89);Muscle weakness (generalized) (M62.81);Apraxia (R48.2)     Time: [id number] PT Time Calculation (min) (ACUTE ONLY): 17 min  Charges:  $Therapeutic Activity: 8-22 mins                     [place] 08/05/2022, 4:18 PM

## 2022-08-05 NOTE — Progress Notes (Signed)
   08/05/22 1200  Vitals  Temp 98.2 F (36.8 C)  Pulse Rate 100  Resp (!) 25  BP 106/60  SpO2 100 %  O2 Device Room Air  Weight 88.8 kg  Type of Weight Post-Dialysis  Oxygen Therapy  Patient Activity (if Appropriate) In bed  Pulse Oximetry Type Continuous  Oximetry Probe Site Changed No  Post Treatment  Dialyzer Clearance Lightly streaked  Duration of HD Treatment -hour(s) 3.5 hour(s)  Hemodialysis Intake (mL) 0 mL  Liters Processed 84  Fluid Removed (mL) 500 mL  Tolerated HD Treatment Yes  AVG/AVF Arterial Site Held (minutes) 8 minutes  AVG/AVF Venous Site Held (minutes) 8 minutes   Received patient in bed to unit.  Alert and oriented.  Informed consent signed and in chart.   [place] duration:3.5hrs  Patient tolerated well.  Transported back to the room  Alert, without acute distress.  Hand-off given to patient's nurse.   Access used: LUA AVF Access issues: none  Total UF removed: 518mL Medication(s) given: Vanc 1G   [facility]

## 2022-08-05 NOTE — Progress Notes (Signed)
Daily Progress Note Intern Pager: [phone]  Patient name: [name] Medical record number: [id number] Date of birth: 1960/07/29 Age: 62 y.o. Gender: female  Primary Care Provider: Lowry Ram, MD Consultants: Cardiology, Nephrology, ID, Neurology, CTS  Code Status: Full Code   Pt Overview and Major Events to Date:  3/20 Admitted 3/21 [place] 3/22 TEE 3/23 CT coronary morph, CODE STROKE   Assessment and Plan: [name] is a 63 year old woman with PMH of ESRD on HD MWF, HTN, T2DM, CVA, paroxysmal VT, HLD, GERD currently admitted for suspected acute bacterial endocarditis, initially presenting with pneumonia and NSTEMI.    She is medically stable for discharge to SNF.  * Acute bacterial endocarditis VSS. Afebrile. Cardiology recommends FDG PET CT to distinguish degenerative disease vs inflammation, however this scan is not available at [facility]. - CTS consulted, recs appreciated - not a surgical candidate - Continue Vancomycin 1000 mg MWF, 05/01 per infectious disease  Stroke due to embolism Appears to be at her baseline, awakens and she is able to answer my questions.  Although, she does think that it is Monday and she appears to have some confusion - Aspirin, subQ heparin -Continue delirium precautions  Hypertension associated with diabetes BP remains soft overnight at 80-98/52-58. MAP wavering at 65. -Hold isosorbide-hydralazine -Holding home Amlodipine -Start midodrine 5 mg TID with HD coming up  Localized swelling of chest wall Uncertain chronicity. Chest xray no acute bony abnormality. No sign of erythema or drainage. CT negative. -Continue to monitor (can consider imaging if persistent or changes in symptoms)  ESRD on dialysis HD on MWF, next HD session today. Renal function at baseline reassuringly despite repeat contrast. -Nephrology following -Velphoro, Sensipar  FEN/GI: Heart healthy PPx: Heparin, 5000 units SQ every 8 hours Dispo: SNF soon. Barriers  include placement.   Subjective:  Patient without concerns today. She remains confused and unable to speak other words with me other than her name.  Objective: Temp:  [97.4 F (36.3 C)-98.1 F (36.7 C)] 97.9 F (36.6 C) (04/01 0806) Pulse Rate:  [80-114] 86 (04/01 0930) Resp:  [15-23] 17 (04/01 0930) BP: (75-103)/(45-64) 80/58 (04/01 0930) SpO2:  [37 %-100 %] 100 % (04/01 0930) Weight:  [89.3 kg] 89.3 kg (04/01 0806) Physical Exam: General: Lying in bed, in NAD Skin: Warm, dry, and intact HEENT: NCAT, EOM grossly normal, midline nasal septum Cardiac: RRR, no m/r/g appreciated Respiratory: CTAB, breathing and speaking comfortably on RA Abdominal: Soft, nontender, nondistended, normoactive bowel sounds Neurological: Oriented only to self, otherwise will only say "uh-huh" when asked questions, is able to localize to voice  Laboratory: Most recent CBC Lab Results  Component Value Date   WBC 10.2 08/05/2022   HGB 11.2 (L) 08/05/2022   HCT 35.7 (L) 08/05/2022   MCV 90.6 08/05/2022   PLT 316 08/05/2022   Most recent BMP    Latest Ref Rng & Units 08/05/2022    6:37 AM  BMP  Glucose 70 - 99 mg/dL 68   BUN 8 - 23 mg/dL 56   Creatinine 0.44 - 1.00 mg/dL 10.72   Sodium 135 - 145 mmol/L 138   Potassium 3.5 - 5.1 mmol/L 4.8   Chloride 98 - 111 mmol/L 93   CO2 22 - 32 mmol/L 25   Calcium 8.9 - 10.3 mg/dL 9.6    Ethelene Hal, MD 08/05/2022, 9:57 AM PGY-1, [place] Intern pager: [phone], text pages welcome Secure chat group [facility] Teaching Service

## 2022-08-05 NOTE — Telephone Encounter (Signed)
Received notification from inpatient nursing staff that patient is scheduled for endo/colon at the hospital on 08/08/22 outpatient with Dr Hilarie Fredrickson. However, she is currently inpatient for CVA and multifocal pneumonia. Dr Hilarie Fredrickson, please advise as to your recommendations regarding procedures.

## 2022-08-05 NOTE — Progress Notes (Signed)
Pt receives out-pt HD at [place] on MWF. Pt arrives at 6:45 am for 6:55 am chair time. Will assist as needed.   Melven Sartorius Renal Navigator [phone]

## 2022-08-05 NOTE — Evaluation (Signed)
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   Aortic atherosclerosis (HCC)    Arthritis of knee    bilateral   Cerebellar cerebrovascular accident (CVA) without late effect 09/23/2019   Coagulation defect, unspecified ([name]) 02/19/2019   Diabetes mellitus    type 2 - no meds   Diabetic gastroparesis ([name])    Diverticulosis    ESRD (end stage renal disease) ([place])    Fibroid uterus    GERD (gastroesophageal reflux disease)    Hyperlipidemia    Hypertension    Hypertensive emergency 02/05/2019   Hypokalemia    IDA (iron deficiency anemia)    Iron deficiency anemia, unspecified 02/24/2019   Nausea    Nausea and vomiting in adult 03/13/2022   Paroxysmal ventricular tachycardia (HCC)    Renal disorder    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[name])    Umbilical hernia    Wears dentures    Past Surgical History:  Past Surgical History:  Procedure Laterality Date   AV FISTULA PLACEMENT Left 02/17/2019   Procedure: ARTERIOVENOUS (AV) FISTULA CREATION LEFT UPPER ARM;  Surgeon: Waynetta Sandy, MD;  Location: [place];  Service: Vascular;  Laterality: Left;   CATARACT EXTRACTION     right eye   CESAREAN SECTION     x2   CHOLECYSTECTOMY     laparoscopic   CORONARY ANGIOGRAPHY N/A 07/25/2022   Procedure: CORONARY ANGIOGRAPHY;  Surgeon: [name], [name], MD;  Location: [name];  Service: Cardiovascular;  Laterality: N/A;   FISTULA SUPERFICIALIZATION Left 04/06/2019   Procedure: FISTULA SUPERFICIALIZATION LEFT BRACHIOCEPHALIC;  Surgeon: Waynetta Sandy, MD;  Location: [name];  Service: Vascular;  Laterality: Left;  Transposition left arm  brachiocephalic fistula.   IR FLUORO GUIDE CV LINE RIGHT  02/09/2019   IR FLUORO GUIDE CV LINE RIGHT  03/05/2019   IR US GUIDE VASC ACCESS RIGHT  02/09/2019   MULTIPLE TOOTH EXTRACTIONS     REDUCTION MAMMAPLASTY Bilateral    TEE WITHOUT CARDIOVERSION N/A 07/26/2022   Procedure: TRANSESOPHAGEAL ECHOCARDIOGRAM (TEE);  Surgeon: Carolyn Klein, MD;  Location: [name] END[facility]COPY;  Service: Cardiovascular;  Laterality: N/A;   HPI:  Patient is a 62 y.o. female with PMH: GERD, HTN, DM, ESRD on HD, HLD. Admitted  from home on 07/24/22 with chest pain after being found down at home following an unwittnessed fall. In ED, she was found to have NSTEMI and multifocal PNA. On 3/23, Code Stroke was called while patient in unit 6E with c/o aphasia, left sided weakness. CT head did not show acute intracranial process but evaluation was limited by contrast in vasculature; MRI multiple scattered subcentimeter acute ischemic infarcts  involving the bilateral cerebral hemispheres and pons. BSE 3/23 did not present with s/s aspiration and regular/thin liquids recommended. Per RN pt is now pocketing food and with signficantly decreased intake and waxing and waning ability to take po. She denies coughing with liquids.    Assessment / Plan / Recommendation  Clinical Impression  Pt's swallowing appears to have declined since initially assessed 3/23. RN states pt is pocketing and has decreased intake. Upper dentures loose and SLP obtained cream which facilitated fit, unfortunately she was unable to  masticate solid and required SLP to remove. With her communicative/cognitive impairments her subjective thoughts re: chewing and swallowing was unreliable. There were several coughs with thin intermittently. Applesauce consumed timely without residue. Suspect her ability to masticate is reliant on her waxing and waning cognition and communciation impairments. Recommend downgrade to puree, continue thin liquids and ST will continue to work with  pt on her mastication for upgrade when appropriate. SLP Visit Diagnosis: Dysphagia, unspecified (R13.10)    Aspiration Risk  Mild aspiration risk    Diet Recommendation Dysphagia 1 (Puree);Thin liquid   Liquid Administration via: Straw;Cup Medication Administration: Whole meds with puree Supervision: Staff to assist with self feeding;Full supervision/cueing for compensatory strategies Compensations: Slow rate;Small sips/bites;Lingual sweep for clearance of pocketing Postural Changes: Seated upright at 90 degrees    Other  Recommendations Oral Care Recommendations: Oral care BID    Recommendations for follow up therapy are one component of a multi-disciplinary discharge planning process, led by the attending physician.  Recommendations may be updated based on patient status, additional functional criteria and insurance authorization.  Follow up Recommendations  (TBD)      Assistance Recommended at Discharge    Functional Status Assessment Patient has had a recent decline in their functional status and demonstrates the ability to make significant improvements in function in a reasonable and predictable amount of time.  Frequency and Duration min 2x/week  2 weeks       Prognosis Prognosis for improved oropharyngeal function:  (fair-good) Barriers to Reach Goals: Cognitive deficits      Swallow Study   General Date of Onset: 07/23/22 HPI: Patient is a 62 y.o. female with PMH: GERD, HTN, DM, ESRD on HD, HLD. Admitted  from home on 07/24/22 with chest pain after being found down at home following an unwittnessed fall. In ED, she was found to have NSTEMI and multifocal PNA. On 3/23, Code Stroke was called while patient in unit 6E with c/o aphasia, left sided weakness. CT head did not show acute intracranial process but evaluation was limited by contrast in vasculature; MRI multiple scattered subcentimeter acute ischemic infarcts  involving the bilateral cerebral hemispheres and pons. BSE  3/23 did not present with s/s aspiration and regular/thin liquids recommended. Per RN pt is now pocketing food and with signficantly decreased intake and waxing and waning ability to take po. She denies coughing with liquids. Type of Study: Bedside Swallow Evaluation Previous Swallow Assessment:  (see HPI) Diet Prior to this Study: Regular;Thin liquids (Level 0) Temperature Spikes Noted: No Respiratory Status: Room air History of Recent Intubation: No Behavior/Cognition: Alert;Cooperative;Distractible;Requires cueing;Pleasant mood Oral Cavity Assessment: Within Functional Limits Oral Care Completed by SLP: No Oral Cavity - Dentition: Dentures, top;Dentures, bottom Vision: Functional for self-feeding Self-Feeding Abilities: Needs assist Patient Positioning: Upright in bed Baseline Vocal Quality: Normal Volitional Cough: Strong Volitional Swallow: Able to elicit    Oral/Motor/Sensory Function Overall Oral Motor/Sensory Function: Within functional limits   Ice Chips Ice chips: Not tested   Thin Liquid Thin Liquid: Impaired Pharyngeal  Phase Impairments: Cough - Immediate    Nectar Thick Nectar Thick Liquid: Not tested   Honey Thick Honey Thick Liquid: Not tested   Puree     Solid     Solid: Impaired Oral Phase Impairments: Reduced lingual movement/coordination;Impaired mastication Oral Phase Functional Implications: Right lateral sulci pocketing      Houston Siren 08/05/2022,3:50 PM

## 2022-08-05 NOTE — Progress Notes (Addendum)
Subjective: Seen in hemodialysis currently tolerating 0.5 L UF, systolic blood pressure [id number], appears asymptomatic nodding head to questions= yes/ no, feels comfortable  Objective Vital signs in last 24 hours: Vitals:   08/05/22 0524 08/05/22 0600 08/05/22 0702 08/05/22 0806  BP: (!) 87/61 (!) 80/56 (!) 89/58 (!) 96/51  Pulse:  (!) 114 85 85  Resp:    (!) 21  Temp:    97.9 F (36.6 C)  TempSrc:      SpO2:  (!) 37% 100% 97%  Weight:    89.3 kg  Height:       Weight change:   Physical Exam: General: Alert on HD, chronically ill, obese appearing adult female in NAD Heart: RRR, 1/6 SEM, no rub or gallop Lungs: CTA anteriorly, nonlabored breathing Abdomen: Obese, soft NABS, NTND Extremities: No pedal edema Dialysis Access: LUA AVF patent on HD   OP dialysis Orders: MWF [place] 4hr, 400/500, EDW 93kg, 2K/2Ca bath, LUE AVF, heparin 4000u + 2000u mid-run bolus - Was getting course of IV iron - Sensipar 90mg  PO q HD - Mircera 30 mcg IV q 2 weeks - last 3/1, not given 3/15 due to Hgb > 11    Problem/Plan: ESRD: Continue HD on usual MWF schedule -HD today, tolerating, next HD 4/ 03 HTN/volume: Yesterday BP low, no edema on exam. BiDil dose reduced and then DC'd because of hypotension, was on low dose metoprolol.  And this was held yesterday.  Required IV fluids last p.m. not eating much..  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 NSTEMI: Cardiology following. LHC 3/21 negative, felt that acute HF was due to stress cardiomyopathy (Takotsubo). EF 35-40% on admit -> back up to 60-65% on 07/26/22. Embolic stroke: In setting of #2. On ASA. Neuro was involved, now signed off and she transferred to CIR. Improved MS, but not to baseline. Multifocal pneumonia:  CXR w/ R sided infiltrates. On ABX.  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 Secondary HPTH: CorrCa elevated and Phos 7.1 (diet is heart because of decreased appetite), continue Velphoro - not eating much. Continue sensipar.  Nutrition: Alb low, continue Nepro supplements   Ernest Haber, PA-C [facility] Beeper [phone] 08/05/2022,8:23 AM  LOS: 12 days   Labs: Basic Metabolic Panel: Recent Labs  Lab 07/31/22 0524 08/02/22 0645 08/05/22 0637  NA 134* 133* 138  K 4.4 4.5 4.8  CL 91* 91* 93*  CO2 26 26 25   GLUCOSE 86 87 68*  BUN 37* 40* 56*  CREATININE 9.04* 8.40* 10.72*  CALCIUM 9.3 9.6 9.6  PHOS 6.2* 5.7* 7.1*   Liver Function Tests: Recent Labs  Lab 07/31/22 0524 08/02/22 0645 08/05/22 0637  ALBUMIN 3.4* 3.4* 3.2*   No results for input(s): "LIPASE", "AMYLASE" in the last 168 hours. No results for input(s): "AMMONIA" in the last 168 hours. CBC: Recent Labs  Lab 07/31/22 0540 08/02/22 0645  WBC 14.8* 12.9*  HGB 11.7* 12.2  HCT 37.2 38.0  MCV 88.8 88.4  PLT 456* 395   Cardiac Enzymes: No results for input(s): "CKTOTAL", "CKMB", "CKMBINDEX", "TROPONINI" in the last 168 hours. CBG: Recent Labs  Lab 08/04/22 1612  GLUCAP 91    Studies/Results: No results found. Medications:  vancomycin 1,000 mg (08/02/22 1308)    aspirin EC  81 mg Oral Daily   atorvastatin  40 mg Oral Daily   cinacalcet  90 mg Oral Q M,W,F-HD   feeding supplement (NEPRO CARB STEADY)  237 mL Oral BID BM   heparin injection (subcutaneous)  5,000 Units Subcutaneous Q8H   lidocaine  1 patch Transdermal Q24H   pantoprazole  40 mg Oral Daily   polyethylene glycol  17 g Oral Daily   sucroferric oxyhydroxide  1,000 mg Oral TID WC

## 2022-08-06 ENCOUNTER — Inpatient Hospital Stay (HOSPITAL_COMMUNITY): Payer: 59

## 2022-08-06 DIAGNOSIS — I330 Acute and subacute infective endocarditis: Secondary | ICD-10-CM | POA: Diagnosis not present

## 2022-08-06 LAB — CULTURE, BLOOD (ROUTINE X 2)
Special Requests: ADEQUATE
Special Requests: ADEQUATE

## 2022-08-06 LAB — PTH, INTACT AND CALCIUM
Calcium, Total (PTH): 9.6 mg/dL (ref 8.7–10.3)
PTH: 36 pg/mL (ref 15–65)

## 2022-08-06 MED ORDER — SORBITOL 70 % SOLN
960.0000 mL | TOPICAL_OIL | Freq: Once | ORAL | Status: AC
  Administered 2022-08-06: 960 mL via RECTAL
  Filled 2022-08-06: qty 240

## 2022-08-06 MED ORDER — PROCHLORPERAZINE EDISYLATE 10 MG/2ML IJ SOLN
10.0000 mg | Freq: Four times a day (QID) | INTRAMUSCULAR | Status: DC | PRN
  Administered 2022-08-06: 10 mg via INTRAVENOUS
  Filled 2022-08-06: qty 2

## 2022-08-06 NOTE — Progress Notes (Addendum)
Speech Language Pathology Treatment: Dysphagia;Cognitive-Linquistic  Patient Details Name: [name] MRN: [id number] DOB: 29-May-1960 Today's Date: 08/06/2022 Time: 1001-1013 SLP Time Calculation (min) (ACUTE ONLY): 12 min  Assessment / Plan / Recommendation Clinical Impression  Pt seen for dysphagia and cognitive therapy. Her responses were less delayed today. She was oriented to self, place, city, year and birthday. She was able to state the months of the year and receptive/expressive language appears functional. Impairments appear cognitive based and her ability to follow commands/answer questions waxes and wanes. Her sustained attention is decreased and can be distractible.  RN reported she declined to eat any breakfast this morning. Observed with graham cracker with delayed mastication and transit. She was eating very little even when she was on regular texture and began to pocket yesterday. Suspect intake is not due to any specific texture but a multitude of reasons including decreased appetite (motivation to eat), exacerbated by cognitive impairments. If texture is increased suspect she will continue to pocket. PT stated pt had chewable med this am and still had oral residue of med when they arrived (cleared by liquid wash). No s/s aspiration with thin, puree or cracker. RN states MD is talking to family about supplemental feeds with a Cortrak due to decreased intake. ST will continue to work with pt on dysphagia and cognition.   HPI HPI: Patient is a 62 y.o. female with PMH: GERD, HTN, DM, ESRD on HD, HLD. Admitted  from home on 07/24/22 with chest pain after being found down at home following an unwittnessed fall. In ED, she was found to have NSTEMI and multifocal PNA. On 3/23, Code Stroke was called while patient in unit 6E with c/o aphasia, left sided weakness. CT head did not show acute intracranial process but evaluation was limited by contrast in vasculature; MRI multiple scattered  subcentimeter acute ischemic infarcts  involving the bilateral cerebral hemispheres and pons. BSE 3/23 did not present with s/s aspiration and regular/thin liquids recommended. Per RN pt is now pocketing food and with signficantly decreased intake and waxing and waning ability to take po. She denies coughing with liquids.      SLP Plan  Continue with current plan of care      Recommendations for follow up therapy are one component of a multi-disciplinary discharge planning process, led by the attending physician.  Recommendations may be updated based on patient status, additional functional criteria and insurance authorization.    Recommendations  Diet recommendations: Dysphagia 1 (puree);Thin liquid Liquids provided via: Straw Medication Administration: Crushed with puree Supervision: Staff to assist with self feeding;Full supervision/cueing for compensatory strategies Compensations: Slow rate;Small sips/bites;Lingual sweep for clearance of pocketing Postural Changes and/or Swallow Maneuvers: Seated upright 90 degrees                  Oral care BID   Frequent or constant Supervision/Assistance Cognitive communication deficit (R41.841);Dysphagia, unspecified (R13.10)     Continue with current plan of care     Houston Siren  08/06/2022, 10:24 AM

## 2022-08-06 NOTE — Progress Notes (Signed)
     Daily Progress Note Intern Pager: [phone]  Patient name: [name] Medical record number: [id number] Date of birth: [birth year] Age: 62 y.o. Gender: female  Primary Care Provider: Lowry Ram, MD Cardiology, Nephrology, ID, Neurology, CTS  Code Status: Full Code   Pt Overview and Major Events to Date:  3/20 Admitted 3/21 [place] 3/22 TEE 3/23 CT coronary morph, CODE STROKE   Assessment and Plan: Ms. [name] is a 62 year old woman with PMH of ESRD on HD MWF, HTN, T2DM, CVA, paroxysmal VT, HLD, GERD currently admitted for acute bacterial endocarditis, initially presenting with pneumonia and NSTEMI, then progressed to having bacterial endocarditis, septic emboli causing stroke. .    She is now medically stable for discharge to SNF and having ongoing palliative discussions.  * Acute bacterial endocarditis Stable. Afebrile.  - Continue vancomycin with HD.   Stroke due to embolism Continues to be disoriented. Has waxing and waning orientation.  - Aspirin, subQ heparin - Continue delirium precautions - Palliative care consult   Hypertension associated with diabetes BP now stable with midodrine.  - Continue midodrine 5 mg TID  - Continue to monitor  - Holding home medications  Localized swelling of chest wall No edema of the marked area on chest today. No tenderness to palpation.   ESRD on dialysis HD on MWF, Renal function at baseline reassuringly despite repeat contrast. -Nephrology following -Velphoro, Sensipar  Abdominal pain Patient has diffuse abdominal pain without guarding. Abdomen is soft with increased bowel sounds. She has not had any vomiting. Most likely this is constipation. Ischemic bowel or obstruction less likely given soft abdomen. Patient has been unable to drink miralax for bowel regimen.  - SMOG Enema and reevaluate abdomen     FEN/GI: Dys 1 PPx: heparin  Dispo:SNF pending approval.   Subjective:  Patient endorses abdominal pain. She just  responds to other questions with "mhmm".   Objective: Temp:  [97.9 F (36.6 C)-98.2 F (36.8 C)] 97.9 F (36.6 C) (04/02 1157) Pulse Rate:  [93-113] 98 (04/02 1157) Resp:  [18-26] 26 (04/02 1157) BP: (82-141)/(50-64) 141/61 (04/02 1157) SpO2:  [98 %-100 %] 100 % (04/02 1157) Physical Exam: General: In no acute distress Cardiovascular: RRR, radial pulses normal and symmetric, no BLE edema  Respiratory: Normal work of breathing on room air Abdomen: Tender to palpation diffusely, soft, no guarding or rebound guarding  MSK: No edema or erythema in marked area on chest.   Laboratory: No labs today   Lowry Ram, MD 08/06/2022, 1:53 PM  PGY-1, [place] Intern pager: [phone], text pages welcome Secure chat group [place]

## 2022-08-06 NOTE — Progress Notes (Signed)
Occupational Therapy Treatment Patient Details Name: [name] MRN: [id number] DOB: Mar 14, 1961 Today's Date: 08/06/2022   History of present illness 62 y.o. female presents to [facility] hospital on 07/23/2022 with chest pain after being found down at home, found to have NSTEMI and PNA. Code stroke called 3/23 and MRI revealed multiple scattered subcentimeter acute ischemic infarcts  involving the bilateral cerebral hemispheres and pons.   PMH includes DM, ESRD, GERD, HLD, HTN.   OT comments  Pt making slow progression toward goals this session, lethargic for most of session, but more alert once returned to supine/semi-reclined in bed at end of session. Pt needing max +2 for bed mobility and max posterior support in sitting to remain upright, pt leaning to L side frequently in sitting, presents with decreased attention to R side this session. Able to sit EOB x 10-15 mins for therex. Pt presenting with impairments listed below, will follow acutely. Patient will benefit from continued inpatient follow up therapy, <3 hours/day, updating d/c recommendation to maximize safety and independence with ADLs/mobility.    Recommendations for follow up therapy are one component of a multi-disciplinary discharge planning process, led by the attending physician.  Recommendations may be updated based on patient status, additional functional criteria and insurance authorization.    Assistance Recommended at Discharge Frequent or constant Supervision/Assistance  Patient can return home with the following  A lot of help with bathing/dressing/bathroom;Assist for transportation;Direct supervision/assist for medications management;Assistance with cooking/housework;Help with stairs or ramp for entrance;Two people to help with walking and/or transfers;Direct supervision/assist for financial management   Equipment Recommendations  Other (comment) (defer)    Recommendations for Other Services PT consult    Precautions /  Restrictions Precautions Precautions: Fall Precaution Comments: watch BP Restrictions Weight Bearing Restrictions: No       Mobility Bed Mobility Overal bed mobility: Needs Assistance Bed Mobility: Sidelying to Sit, Sit to Supine, Rolling Rolling: +2 for physical assistance, Max assist Sidelying to sit: +2 for physical assistance, Total assist, HOB elevated   Sit to supine: +2 for physical assistance, Total assist        Transfers                         Balance Overall balance assessment: Needs assistance Sitting-balance support: Bilateral upper extremity supported, Feet supported Sitting balance-Leahy Scale: Poor Sitting balance - Comments: max A support in sitting EOB                                   ADL either performed or assessed with clinical judgement   ADL       Grooming: Wash/dry hands;Wash/dry face;Bed level;Moderate assistance Grooming Details (indicate cue type and reason): mod verbal and visual cues to bring hand to mouth                                    Extremity/Trunk Assessment Upper Extremity Assessment LUE Deficits / Details: AAROM of BUE this session, limited due to cognition?   Lower Extremity Assessment Lower Extremity Assessment: Defer to PT evaluation        Vision       Perception Perception Perception: Not tested   Praxis Praxis Praxis: Impaired Praxis Impairment Details: Motor planning;Initiation    Cognition Arousal/Alertness: Awake/alert Behavior During Therapy: Flat affect Overall Cognitive Status: Impaired/Different from  baseline Area of Impairment: Attention, Following commands, Problem solving, Awareness                   Current Attention Level: Sustained   Following Commands: Follows one step commands inconsistently, Follows one step commands with increased time Safety/Judgement: Decreased awareness of safety, Decreased awareness of deficits Awareness:  Intellectual Problem Solving: Slow processing, Decreased initiation, Difficulty sequencing, Requires verbal cues, Requires tactile cues General Comments: pt with R inattention this session, cues to gaze to R to look at hand before planning movement        Exercises Other Exercises Other Exercises: BUE AAROM x5 functional reach in sitting/supported sitting    Shoulder Instructions       General Comments VSS On RA    Pertinent Vitals/ Pain       Pain Assessment Pain Assessment: Faces Pain Score: 4  Faces Pain Scale: Hurts little more Pain Location: abdomen/stomach Pain Descriptors / Indicators: Grimacing, Guarding, Discomfort Pain Intervention(s): Limited activity within patient's tolerance, Monitored during session, Repositioned  Home Living                                          Prior Functioning/Environment              Frequency  Min 2X/week        Progress Toward Goals  OT Goals(current goals can now be found in the care plan section)  Progress towards OT goals: Progressing toward goals  Acute Rehab OT Goals Patient Stated Goal: none stated OT Goal Formulation: With patient Time For Goal Achievement: 08/11/22 Potential to Achieve Goals: Good ADL Goals Pt Will Perform Grooming: bed level;sitting;with min guard assist Pt Will Perform Upper Body Dressing: with min assist;sitting;bed level Pt Will Perform Lower Body Dressing: with min assist;sit to/from stand;with min guard assist;sitting/lateral leans Pt Will Transfer to Toilet: with min guard assist;ambulating;bedside commode Pt Will Perform Toileting - Clothing Manipulation and hygiene: with min guard assist;with min assist;sitting/lateral leans;sit to/from stand Additional ADL Goal #1: pt wil complete bed mobility with min A in prep for ADLs  Plan Frequency remains appropriate;Discharge plan needs to be updated    Co-evaluation    PT/OT/SLP Co-Evaluation/Treatment: Yes Reason for  Co-Treatment: To address functional/ADL transfers;For patient/therapist safety PT goals addressed during session: Mobility/safety with mobility;Balance;Strengthening/ROM OT goals addressed during session: ADL's and self-care;Strengthening/ROM      AM-PAC OT "6 Clicks" Daily Activity     Outcome Measure   Help from another person eating meals?: A Lot Help from another person taking care of personal grooming?: A Lot Help from another person toileting, which includes using toliet, bedpan, or urinal?: Total Help from another person bathing (including washing, rinsing, drying)?: A Lot Help from another person to put on and taking off regular upper body clothing?: A Lot Help from another person to put on and taking off regular lower body clothing?: Total 6 Click Score: 10    End of Session Equipment Utilized During Treatment: Gait belt;Rolling walker (2 wheels)  OT Visit Diagnosis: Unsteadiness on feet (R26.81);Muscle weakness (generalized) (M62.81);History of falling (Z91.81);Other symptoms and signs involving cognitive function;Pain   Activity Tolerance Patient tolerated treatment well;Patient limited by fatigue   Patient Left in bed;with call bell/phone within reach;with bed alarm set   Nurse Communication Mobility status        Time: [id number] OT Time Calculation (min): 36 min  Charges: OT General Charges $OT Visit: 1 Visit OT Treatments $Therapeutic Activity: 8-22 mins  Renaye Rakers, OTD, OTR/L SecureChat Preferred Acute Rehab [phone]   Ulla Gallo 08/06/2022, 12:37 PM

## 2022-08-06 NOTE — Progress Notes (Signed)
Case discussed with nephrologist and renal PA. Pt is for snf placement and reviewed PT notes. Inquired of nephrology staff if it felt pt should trial HD in the chair prior to d/c to make sure pt can tolerate and appropriate for out-pt HD. Renal PA has ordered HD tomorrow in the chair to see how pt tolerates. Update provided to CSW.   Melven Sartorius Renal Navigator [phone]

## 2022-08-06 NOTE — Progress Notes (Signed)
Physical Therapy Treatment Patient Details Name: [name] MRN: [id number] DOB: 11-Aug-1960 Today's Date: 08/06/2022   History of Present Illness 62 y.o. female presents to [facility] hospital on 07/23/2022 with chest pain after being found down at home, found to have NSTEMI and PNA. Code stroke called 3/23 and MRI revealed multiple scattered subcentimeter acute ischemic infarcts  involving the bilateral cerebral hemispheres and pons.   PMH includes DM, ESRD, GERD, HLD, HTN.    PT Comments    Patient engaging with head nodes and facial expressions at start of session, difficulty speaking. Total Assist required for bed mobility roll, supine<>sit with pt making improved initiation of UE movement to reach bed rails; no initiation noted in LE's. Pt demonstrated some attempt to press up through Lt forearm with rise to sit EOB but is still unable to maintain seated balance without support posteriorly due to Lt/posterior lean in sitting. Pt able to attempt forearm prop on Rt UE with Max assist to guide to position. 2x sit<>stand attempted with +2 max assist, no initiation noted in LE's but pt using bil Ue's to support trunk by holding arms of PT/OT. Pt returned to supine and moved to chair position. Noted pt had some chewable meds in mouth from earlier and encouraged small sips of water to clear mouth. Pt engaged in self care with OT and improved verbal communication noted after with clear 1-2 word responses. EOS pt resting in chair position, Alarm on and call bell within reach. Continue to recommend intense follow up rehab in post-acute setting.   Recommendations for follow up therapy are one component of a multi-disciplinary discharge planning process, led by the attending physician.  Recommendations may be updated based on patient status, additional functional criteria and insurance authorization.  Follow Up Recommendations  Can patient physically be transported by private vehicle: No    Assistance Recommended  at Discharge Frequent or constant Supervision/Assistance  Patient can return home with the following Two people to help with walking and/or transfers;Two people to help with bathing/dressing/bathroom;Assist for transportation;Help with stairs or ramp for entrance;Direct supervision/assist for financial management;Direct supervision/assist for medications management;Assistance with cooking/housework;Assistance with feeding   Equipment Recommendations  Wheelchair (measurements PT);Wheelchair cushion (measurements PT);Other (comment);Hospital bed (hoyer lift)    Recommendations for Other Services       Precautions / Restrictions Precautions Precautions: Fall Precaution Comments: watch BP Restrictions Weight Bearing Restrictions: No     Mobility  Bed Mobility Overal bed mobility: Needs Assistance Bed Mobility: Sidelying to Sit, Sit to Supine, Rolling Rolling: +2 for physical assistance, Max assist Sidelying to sit: +2 for physical assistance, Total assist, HOB elevated   Sit to supine: +2 for physical assistance, Total assist   General bed mobility comments: pt with some initiation of Lt UE to bed rail with tactile cues and repositioning trunk to improve position for reaching. Hand ove hand assist to bring Rt UE across body to reach rail with Lt hand. Pt with no initaition of LE's to EOB and Total assist for  lower body. Pt required Assist to bring trunk upright and once 50% to sitting pt propped on Lt elbow to attempt initiation of press up, Max assist ultimately needed. Pt unable to maintain seated balance at EOB without support, significant posterior and Lt lean. Pt completed forearm propr on Rt for ~4 minutes with Max assist to obtain position. Sit<>stand attempted with 2+ assist (3rd person assisting posteriorly to prevent posterior LOB); verbal/tactile cues for pt to grasp therapists arms and pt  able to do so Lt>Rt. BEd pad for Max Assist at hips for facilitate lift/clearance. No  activation noted in LE's to attempt rise, Total assist +2 to clear ~1 inch off bed 2x. Lateral scoot with bed pad +2 assist to move towards [facility] prior to return to supine. Total Assist to control return to supine. Once supine and pt boosted superiorly bed adjust to chair position. Pt able to reach for bil bed rails with Mod assist for bil UE's and initaited forward trunk lean/pull up to bring trunk off bed. Pt maintained sitting back unsupported with Lt UE support and slight lean to drink small sips of water to wash mouth. Pt able to switch UE support to Rt rail and release Lt UE to pull with Rt and obtain more midline posture. Pt perforemd self care with facilitation of OT. EOS pt reported comfortable in chair position, rested back with bil UE supported with pillows.    Transfers                        Ambulation/Gait                   Stairs             Wheelchair Mobility    Modified Rankin (Stroke Patients Only) Modified Rankin (Stroke Patients Only) Pre-Morbid Rankin Score: No significant disability Modified Rankin: Severe disability     Balance Overall balance assessment: Needs assistance Sitting-balance support: Bilateral upper extremity supported, Feet supported Sitting balance-Leahy Scale: Poor   Postural control: Posterior lean, Left lateral lean   Standing balance-Leahy Scale: Zero                              Cognition Arousal/Alertness: Awake/alert Behavior During Therapy: Flat affect (smiling intermittently and expression changes through eye movements/eyebrows) Overall Cognitive Status: Impaired/Different from baseline Area of Impairment: Attention, Following commands, Problem solving, Awareness                   Current Attention Level: Sustained   Following Commands: Follows one step commands inconsistently, Follows one step commands with increased time Safety/Judgement: Decreased awareness of safety, Decreased  awareness of deficits Awareness: Intellectual Problem Solving: Slow processing, Decreased initiation, Difficulty sequencing, Requires verbal cues, Requires tactile cues General Comments: Pt with very slow processing and appears to have significant problems with motor planning/initiation. Pt with better initiation of UE's vs LE's. pt appears to have Rt inattention but able to attend with verbal/tactile cues.        Exercises      General Comments        Pertinent Vitals/Pain Pain Assessment Pain Assessment: Faces Faces Pain Scale: Hurts little more Pain Location: abdomen/stomach Pain Descriptors / Indicators: Grimacing, Guarding, Discomfort Pain Intervention(s): Limited activity within patient's tolerance, Monitored during session, Repositioned    Home Living                          Prior Function            PT Goals (current goals can now be found in the care plan section) Acute Rehab PT Goals Patient Stated Goal: get home and stronger PT Goal Formulation: With patient Time For Goal Achievement: 08/16/22 Potential to Achieve Goals: Fair Progress towards PT goals: Progressing toward goals (slow)    Frequency    Min 1X/week  PT Plan Current plan remains appropriate    Co-evaluation PT/OT/SLP Co-Evaluation/Treatment: Yes Reason for Co-Treatment: To address functional/ADL transfers;For patient/therapist safety PT goals addressed during session: Mobility/safety with mobility;Balance;Strengthening/ROM OT goals addressed during session: ADL's and self-care;Strengthening/ROM      AM-PAC PT "6 Clicks" Mobility   Outcome Measure  Help needed turning from your back to your side while in a flat bed without using bedrails?: Total Help needed moving from lying on your back to sitting on the side of a flat bed without using bedrails?: Total Help needed moving to and from a bed to a chair (including a wheelchair)?: Total Help needed standing up from a chair  using your arms (e.g., wheelchair or bedside chair)?: Total Help needed to walk in hospital room?: Total Help needed climbing 3-5 steps with a railing? : Total 6 Click Score: 6    End of Session   Activity Tolerance: Patient limited by fatigue Patient left: in bed;with call bell/phone within reach;with bed alarm set (chair position of bed) Nurse Communication: Mobility status;Need for lift equipment PT Visit Diagnosis: Other abnormalities of gait and mobility (R26.89);Muscle weakness (generalized) (M62.81);Apraxia (R48.2)     Time: [id number] PT Time Calculation (min) (ACUTE ONLY): 35 min  Charges:  $Therapeutic Activity: 8-22 mins                     Verner Mould, DPT Acute Rehabilitation Services Office [phone]  08/06/22 10:15 AM

## 2022-08-06 NOTE — Progress Notes (Signed)
Called patient's daughter Ms. Arnoldo Morale.  Patient's daughter says that she was here last night.  She has talked to her mother regarding patient not eating.  She relates that patient did not want a PEG tube however she was not able to eat much.  Daughter says that patient has had issues with her stomach and nausea and vomiting with presumed gastroparesis in the past.  Daughter thinks that gastroparesis may be the reason that she is not eating.  Daughter does feel that patient "has given up".  Upon talking about patient's abdominal pain, daughter says that she does not think that patient is constipated.  She says that they have had to clean up patient after she had bowel incontinence yesterday and the day before. Daughter says that she understands that neck step would be feeding tube if she wanted to go down that route.  She wants to do everything that she can for her mother but also understands that her current stage may not get better.  She is amenable to speaking with palliative care to discuss her options. I discussed with the daughter that we will need to balance keeping patient comfortable and treating her symptoms with life prolonging measures.  Lowry Ram, MD  [place]

## 2022-08-06 NOTE — Assessment & Plan Note (Signed)
Patient has diffuse abdominal pain without guarding. Abdomen is soft with increased bowel sounds. She has not had any vomiting. Most likely this is constipation. Ischemic bowel or obstruction less likely given soft abdomen. Patient has been unable to drink miralax for bowel regimen.  - SMOG Enema and reevaluate abdomen

## 2022-08-06 NOTE — Telephone Encounter (Signed)
Cancelled procedures at [facility] for 08/08/22. Will contact patient to schedule office visit after she has recovered from the hospital.

## 2022-08-06 NOTE — Telephone Encounter (Signed)
If she is inpatient for CVA and pneumonia her procedures definitely need to be canceled and rescheduled It would likely be best to have her come back to the office to be reseen given stroke and recent hospitalization by either me or an APP prior to endoscopic evaluations

## 2022-08-06 NOTE — Progress Notes (Addendum)
Subjective:  Seen in room slightly more animated today states normal dialysis "Monday Wednesday Friday," said tolerated dialysis yesterday, HD tomorrow on schedule will plan WF with little p.o. intake  Objective Vital signs in last 24 hours: Vitals:   08/05/22 2347 08/06/22 0427 08/06/22 0753 08/06/22 0937  BP: (!) 122/55 (!) 111/51 (!) 114/53 102/64  Pulse: 95 99 99 (!) 113  Resp:  18 (!) 24   Temp: 97.9 F (36.6 C) 98.1 F (36.7 C)    TempSrc: Oral Oral    SpO2: 99% 100% 100% 99%  Weight:      Height:       Weight change:   Physical Exam: General: Alert , chronically ill, obese appearing adult female in NAD, more animated with speech today  Heart: RRR, 1/6 SEM, no rub or gallop Lungs: CTA anteriorly, nonlabored breathing Abdomen: Obese, soft NABS, NTND Extremities: No pedal edema Dialysis Access: LUA AVF + bruit     OP dialysis Orders: MWF [place] 4hr, 400/500, EDW 93kg, 2K/2Ca bath, LUE AVF, heparin 4000u + 2000u mid-run bolus - Was getting course of IV iron - Sensipar 90mg  PO q HD - Mircera 30 mcg IV q 2 weeks - last 3/1, not given 3/15 due to Hgb > 11     Problem/Plan: ESRD: Continue HD on usual MWF schedule -next HD 4/03 HTN/volume: BP lowish with meds now off/on hold no edema on exam..  With low p.o. 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 NSTEMI: Cardiology workup LHC 3/21 negative, felt that acute HF was due to stress cardiomyopathy (Takotsubo). EF 35-40% on admit -> back up to 60-65% on 07/26/22. 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 Neuro was involved, now signed off /MS waxes and wanes not to baseline./ Multifocal pneumonia: CXR w/ R sided infiltrates. On ABX.  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 Secondary HPTH: CorrCa elevated and Phos 7.1 (diet is heart because of decreased appetite), on  Velphoro - not  eating much. Continue sensipar.  Nutrition: Alb 3.2,  on Nepro supplements/noted speech involved for dysphagia/cognitive/Linquistic  Ernest Haber, PA-C [facility] Beeper [phone] 08/06/2022,10:33 AM  LOS: 13 days   Labs: Basic Metabolic Panel: Recent Labs  Lab 07/31/22 0524 08/02/22 0645 08/05/22 0637  NA 134* 133* 138  K 4.4 4.5 4.8  CL 91* 91* 93*  CO2 26 26 25   GLUCOSE 86 87 68*  BUN 37* 40* 56*  CREATININE 9.04* 8.40* 10.72*  CALCIUM 9.3 9.6 9.6  PHOS 6.2* 5.7* 7.1*   Liver Function Tests: Recent Labs  Lab 07/31/22 0524 08/02/22 0645 08/05/22 0637  ALBUMIN 3.4* 3.4* 3.2*   No results for input(s): "LIPASE", "AMYLASE" in the last 168 hours. No results for input(s): "AMMONIA" in the last 168 hours. CBC: Recent Labs  Lab 07/31/22 0540 08/02/22 0645 08/05/22 0816  WBC 14.8* 12.9* 10.2  HGB 11.7* 12.2 11.2*  HCT 37.2 38.0 35.7*  MCV 88.8 88.4 90.6  PLT 456* 395 316   Cardiac Enzymes: No results for input(s): "CKTOTAL", "CKMB", "CKMBINDEX", "TROPONINI" in the last 168 hours. CBG: Recent Labs  Lab 08/04/22 1612  GLUCAP 91    Studies/Results: No results found. Medications:  vancomycin Stopped (08/05/22 1358)    aspirin EC  81 mg Oral Daily   atorvastatin  40 mg Oral Daily   cinacalcet  90 mg Oral Q  M,W,F-HD   feeding supplement (NEPRO CARB STEADY)  237 mL Oral BID BM   heparin injection (subcutaneous)  5,000 Units Subcutaneous Q8H   lidocaine  1 patch Transdermal Q24H   midodrine  5 mg Oral TID WC   pantoprazole  40 mg Oral Daily   polyethylene glycol  17 g Oral Daily   sorbitol, milk of mag, mineral oil, glycerin (SMOG) enema  960 mL Rectal Once   sucroferric oxyhydroxide  1,000 mg Oral TID WC

## 2022-08-06 NOTE — TOC Progression Note (Signed)
Transition of Care [facility]) - Progression Note    Patient Details  Name: [name] MRN: [id number] Date of Birth: [birth year]  Transition of Care [facility]) CM/SW [place], [place] Phone Number: 08/06/2022, 11:38 AM  Clinical Narrative:     Insurance requested P2P [phone] option 5 , must be completed by 2:30 pm today.   Expected Discharge Plan: [place] Barriers to Discharge: Continued Medical Work up  Expected Discharge Plan and Services In-house Referral: Clinical Social Work Discharge Planning Services: CM Consult Post Acute Care Choice: IP Rehab Living arrangements for the past 2 months: Apartment                   DME Agency: NA                   Social Determinants of Health (SDOH) Interventions SDOH Screenings   Food Insecurity: No Food Insecurity (07/24/2022)  Housing: Low Risk  (07/24/2022)  Transportation Needs: No Transportation Needs (07/24/2022)  Utilities: Not At Risk (07/24/2022)  Alcohol Screen: Low Risk  (05/31/2021)  Depression (PHQ2-9): Low Risk  (05/02/2022)  Financial Resource Strain: Low Risk  (05/31/2021)  Physical Activity: Sufficiently Active (05/31/2021)  Social Connections: Moderately Integrated (05/31/2021)  Stress: No Stress Concern Present (05/31/2021)  Tobacco Use: Medium Risk (07/28/2022)    Readmission Risk Interventions    07/29/2022    4:53 PM  Readmission Risk Prevention Plan  Transportation Screening Complete  HRI or Home Care Consult Complete  Social Work Consult for [place] Planning/Counseling Complete  Palliative Care Screening Not Applicable  Medication Review Press photographer) Referral to Pharmacy

## 2022-08-06 NOTE — TOC Progression Note (Signed)
Transition of Care [facility]) - Progression Note    Patient Details  Name: [name] MRN: [id number] Date of Birth: July 20, 1960  Transition of Care [facility]) CM/SW [place], [place] Phone Number: 08/06/2022, 2:32 PM  Clinical Narrative:     CSW met with pt sister bedside. Provided SNF bed offers. Sister states that doctors informed pt's daughter that palliative is supposed to meet with them to discuss [place]. TOC will follow.   SNF [facility] is currently approved though Everlene Balls has contacted CSW requesting a facility choice to be added to [place].    Expected Discharge Plan: [place] Barriers to Discharge: Continued Medical Work up  Expected Discharge Plan and Services In-house Referral: Clinical Social Work Discharge Planning Services: CM Consult Post Acute Care Choice: IP Rehab Living arrangements for the past 2 months: Apartment                   DME Agency: NA                   Social Determinants of Health (SDOH) Interventions SDOH Screenings   Food Insecurity: No Food Insecurity (07/24/2022)  Housing: Low Risk  (07/24/2022)  Transportation Needs: No Transportation Needs (07/24/2022)  Utilities: Not At Risk (07/24/2022)  Alcohol Screen: Low Risk  (05/31/2021)  Depression (PHQ2-9): Low Risk  (05/02/2022)  Financial Resource Strain: Low Risk  (05/31/2021)  Physical Activity: Sufficiently Active (05/31/2021)  Social Connections: Moderately Integrated (05/31/2021)  Stress: No Stress Concern Present (05/31/2021)  Tobacco Use: Medium Risk (07/28/2022)    Readmission Risk Interventions    07/29/2022    4:53 PM  Readmission Risk Prevention Plan  Transportation Screening Complete  HRI or Home Care Consult Complete  Social Work Consult for [place] Planning/Counseling Complete  Palliative Care Screening Not Applicable  Medication Review Press photographer) Referral to Pharmacy

## 2022-08-07 DIAGNOSIS — I330 Acute and subacute infective endocarditis: Secondary | ICD-10-CM | POA: Diagnosis not present

## 2022-08-07 LAB — CBC
HCT: 36.4 % (ref 36.0–46.0)
Hemoglobin: 11.5 g/dL — ABNORMAL LOW (ref 12.0–15.0)
MCH: 28.3 pg (ref 26.0–34.0)
MCHC: 31.6 g/dL (ref 30.0–36.0)
MCV: 89.4 fL (ref 80.0–100.0)
Platelets: 336 10*3/uL (ref 150–400)
RBC: 4.07 MIL/uL (ref 3.87–5.11)
RDW: 16.2 % — ABNORMAL HIGH (ref 11.5–15.5)
WBC: 10.1 10*3/uL (ref 4.0–10.5)
nRBC: 0 % (ref 0.0–0.2)

## 2022-08-07 LAB — COMPREHENSIVE METABOLIC PANEL
ALT: 69 U/L — ABNORMAL HIGH (ref 0–44)
AST: 45 U/L — ABNORMAL HIGH (ref 15–41)
Albumin: 3.3 g/dL — ABNORMAL LOW (ref 3.5–5.0)
Alkaline Phosphatase: 58 U/L (ref 38–126)
Anion gap: 16 — ABNORMAL HIGH (ref 5–15)
BUN: 48 mg/dL — ABNORMAL HIGH (ref 8–23)
CO2: 26 mmol/L (ref 22–32)
Calcium: 9.5 mg/dL (ref 8.9–10.3)
Chloride: 94 mmol/L — ABNORMAL LOW (ref 98–111)
Creatinine, Ser: 9.49 mg/dL — ABNORMAL HIGH (ref 0.44–1.00)
GFR, Estimated: 4 mL/min — ABNORMAL LOW (ref >60)
Glucose, Bld: 78 mg/dL (ref 70–99)
Potassium: 5.1 mmol/L (ref 3.5–5.1)
Sodium: 136 mmol/L (ref 135–145)
Total Bilirubin: 0.9 mg/dL (ref 0.3–1.2)
Total Protein: 7.1 g/dL (ref 6.5–8.1)

## 2022-08-07 LAB — RENAL FUNCTION PANEL
Albumin: 3.2 g/dL — ABNORMAL LOW (ref 3.5–5.0)
Anion gap: 17 — ABNORMAL HIGH (ref 5–15)
BUN: 48 mg/dL — ABNORMAL HIGH (ref 8–23)
CO2: 26 mmol/L (ref 22–32)
Calcium: 9.5 mg/dL (ref 8.9–10.3)
Chloride: 94 mmol/L — ABNORMAL LOW (ref 98–111)
Creatinine, Ser: 9.35 mg/dL — ABNORMAL HIGH (ref 0.44–1.00)
GFR, Estimated: 4 mL/min — ABNORMAL LOW (ref >60)
Glucose, Bld: 80 mg/dL (ref 70–99)
Phosphorus: 7.4 mg/dL — ABNORMAL HIGH (ref 2.5–4.6)
Potassium: 4.8 mmol/L (ref 3.5–5.1)
Sodium: 137 mmol/L (ref 135–145)

## 2022-08-07 LAB — VANCOMYCIN, RANDOM: Vancomycin Rm: 36 ug/mL

## 2022-08-07 MED ORDER — HEPARIN SODIUM (PORCINE) 1000 UNIT/ML IJ SOLN
INTRAMUSCULAR | Status: AC
  Administered 2022-08-07: 4000 [IU]
  Filled 2022-08-07: qty 4

## 2022-08-07 MED ORDER — HEPARIN SODIUM (PORCINE) 1000 UNIT/ML IJ SOLN
2000.0000 [IU] | Freq: Once | INTRAMUSCULAR | Status: AC
  Administered 2022-08-07: 2000 [IU] via INTRAVENOUS
  Filled 2022-08-07: qty 2

## 2022-08-07 MED ORDER — VANCOMYCIN HCL 750 MG/150ML IV SOLN: 750.0000 mg | INTRAVENOUS | Status: DC

## 2022-08-07 NOTE — TOC Progression Note (Addendum)
Transition of Care [facility]) - Progression Note    Patient Details  Name: [name] MRN: [id number] Date of Birth: 1960-08-02  Transition of Care [facility]) CM/SW [place], [place] Phone Number: 08/07/2022, 2:27 PM  Clinical Narrative:      Update- CSW spoke with [facility] who confirmed she can accept patient for SNF . CSW added facility to patients insurance authorization approval. Auth ID# [id number]. Insurance authorization approved from 4/2-4/4. CSW informed tracy renal navigator. CSW spoke with patients daughter regarding SNF bed offers. Patients daughter accepted SNF bed offer with [facility]. CSW called Kitty with [facility] and LVM. CSW awaiting call back to confirm SNF bed for patient. CSW will continue to follow and assist with patients dc planning needs.  Expected Discharge Plan: [place] Barriers to Discharge: Continued Medical Work up  Expected Discharge Plan and Services In-house Referral: Clinical Social Work Discharge Planning Services: CM Consult Post Acute Care Choice: IP Rehab Living arrangements for the past 2 months: Apartment                   DME Agency: NA                   Social Determinants of Health (SDOH) Interventions SDOH Screenings   Food Insecurity: No Food Insecurity (07/24/2022)  Housing: Low Risk  (07/24/2022)  Transportation Needs: No Transportation Needs (07/24/2022)  Utilities: Not At Risk (07/24/2022)  Alcohol Screen: Low Risk  (05/31/2021)  Depression (PHQ2-9): Low Risk  (05/02/2022)  Financial Resource Strain: Low Risk  (05/31/2021)  Physical Activity: Sufficiently Active (05/31/2021)  Social Connections: Moderately Integrated (05/31/2021)  Stress: No Stress Concern Present (05/31/2021)  Tobacco Use: Medium Risk (07/28/2022)    Readmission Risk Interventions    07/29/2022    4:53 PM  Readmission Risk Prevention Plan  Transportation Screening Complete  HRI or Home Care Consult Complete  Social Work  Consult for [place] Planning/Counseling Complete  Palliative Care Screening Not Applicable  Medication Review Press photographer) Referral to Pharmacy

## 2022-08-07 NOTE — Progress Notes (Signed)
Pharmacy Antibiotic Note  [name] is a 62 y.o. female admitted on 07/23/2022 with  unwitnessed fall, abd pain, multifocal pneumonia on CT scan . Blood cultures have grown corynebacterium and pan(s) e-faecalis. On 3/22 TEE revealed endocarditis. Pharmacy has been consulted for vancomycin dosing.  Pt has ESRD with HD MWF.  Pre-dialysis vancomycin level 36 mcg/ml. Expect post-dialysis level ~20 mcg/ml  Plan: Hold post-dialysis vancomycin today  Decrease vancomycin to 750mg  IV following each HD (MWF) F/U HD schedule and clinical progression Plan is for 6 weeks of antibiotics (end date of 05/01)  Height: 5\' 10"  (177.8 cm) Weight: 88.8 kg (195 lb 12.3 oz) IBW/kg (Calculated) : 68.5  Temp (24hrs), Avg:98.1 F (36.7 C), Min:97.6 F (36.4 C), Max:98.6 F (37 C)  Recent Labs  Lab 08/02/22 0645 08/05/22 0637 08/05/22 0816 08/07/22 0626 08/07/22 0841  WBC 12.9*  --  10.2 10.1  --   CREATININE 8.40* 10.72*  --  9.49*  9.35*  --   VANCORANDOM  --   --   --   --  36     Estimated Creatinine Clearance: 7.4 mL/min (A) (by C-G formula based on SCr of 9.35 mg/dL (H)).    Allergies  Allergen Reactions   Lisinopril Anaphylaxis and Other (See Comments)    angioedema   Venofer  [Ferric Oxide]     Other reaction(s): Back Pain   Camellia Swelling and Other (See Comments)    Angioedema    Jardiance [Empagliflozin] Swelling and Rash    Antimicrobials this admission: Rocephin 3/19 >> 3/23 Azithro 3/19 >> 3/21 Vanc 3/20 >>  Dose adjustments this admission: 3/27 preHD lvl 33 - vanc 500 mg x1 4/3 preHD lvl 36 - vanc held X1, dose reduced to 750mg  MWF with HD  Microbiology results: 3/20 BCx: 2/4 GPR-corynebacterium JK + e. Faecalis (amp sens) + Staph capitis 3/19 COVID, Flu, RSV neg 3/20 MRSA PCR neg 3/22 BCx: NGTD 3/23 BCID: staph species  Thank you for involving pharmacy in this patient's care.  Titus Dubin, PharmD PGY1 Pharmacy Resident 08/07/2022 10:51 AM

## 2022-08-07 NOTE — Progress Notes (Signed)
Physical Therapy Treatment Patient Details Name: [name] MRN: [id number] DOB: 08-19-1960 Today's Date: 08/07/2022   History of Present Illness 62 y.o. female presents to [facility] hospital on 07/23/2022 with chest pain after being found down at home, found to have NSTEMI and PNA. Code stroke called 3/23 and MRI revealed multiple scattered subcentimeter acute ischemic infarcts  involving the bilateral cerebral hemispheres and pons.   PMH includes DM, ESRD, GERD, HLD, HTN.    PT Comments    Pt received sitting in dialysis recliner at start of session and more alert and communicating in sentences at start of session. Pt highly motivated to participate in therapy and session focused on functional sit<>stands with [facility] and Max assist followed by [facility] transfer with max +2 assist. Suspect pt became orthostatic with standing in [place] and Total Assist for stand from Stedy paddles and return to EOB and supine. Pt's symptoms improved once supine and engaging in conversation again and following simple commands after ~1 minute able to complete SAQ with bed in partial chair position. BP stable. EOS lunch tray provided with set up assist and pt's family at bedside. Will continue to progress as able with plan for post-acute follow up therapy to progress functional mobility and address deficits.   Recommendations for follow up therapy are one component of a multi-disciplinary discharge planning process, led by the attending physician.  Recommendations may be updated based on patient status, additional functional criteria and insurance authorization.  Follow Up Recommendations  Can patient physically be transported by private vehicle: No    Assistance Recommended at Discharge Frequent or constant Supervision/Assistance  Patient can return home with the following Two people to help with walking and/or transfers;Two people to help with bathing/dressing/bathroom;Assist for transportation;Help with stairs or ramp for  entrance;Direct supervision/assist for financial management;Direct supervision/assist for medications management;Assistance with cooking/housework;Assistance with feeding   Equipment Recommendations  Wheelchair (measurements PT);Wheelchair cushion (measurements PT);Other (comment);Hospital bed    Recommendations for Other Services       Precautions / Restrictions Precautions Precautions: Fall Precaution Comments: watch BP Restrictions Weight Bearing Restrictions: No     Mobility  Bed Mobility Overal bed mobility: Needs Assistance Bed Mobility: Sit to Supine       Sit to supine: +2 for physical assistance, Total assist   General bed mobility comments: Pt possibly orthostatic following sit<>stand from chair to [facility]. Total assist from [facility] to sit back EOb and return to supine to control trunk and bring bil LE's onto bed.    Transfers Overall transfer level: Needs assistance Equipment used: 2 person hand held assist Transfers: Sit to/from Stand, Bed to chair/wheelchair/BSC Sit to Stand: +2 physical assistance, +2 safety/equipment, Max assist   Step pivot transfers: +2 safety/equipment, +2 physical assistance, Max assist       General transfer comment: Pt seated in dialysis chair at start of session. 2+ Max asisst for sit<>stand with 2HHA. PT/Ot blocking bil LE's and facilitating at hips for upright standing. 2x with HHA. Stedy provided for sit<>stand from recliner. pt performed 3x total with Max assist at hips to shift anterior and Rt/Lt for paddle lowering. Once standing pt comminicating well with therapist, after ~30 seconds pt stating "wow" and drifting Rt slightly. pt pivoted in Stedy to move to EOB and Total assist to rise and lower back to sit EOB and return supine. alertness improved in supine and BP stable. Transfer via Lift Equipment: Stedy  Ambulation/Gait  Stairs             Wheelchair Mobility    Modified Rankin (Stroke Patients  Only)       Balance Overall balance assessment: Needs assistance Sitting-balance support: Bilateral upper extremity supported, Feet supported Sitting balance-Leahy Scale: Poor Sitting balance - Comments: max A support in sitting EOB   Standing balance support: Bilateral upper extremity supported Standing balance-Leahy Scale: Zero Standing balance comment: dependent on external support                            Cognition Arousal/Alertness: Awake/alert Behavior During Therapy: Flat affect Overall Cognitive Status: Impaired/Different from baseline Area of Impairment: Attention, Following commands, Problem solving, Awareness, Safety/judgement                   Current Attention Level: Sustained   Following Commands: Follows one step commands inconsistently, Follows one step commands with increased time, Follows multi-step commands inconsistently, Follows multi-step commands with increased time Safety/Judgement: Decreased awareness of safety, Decreased awareness of deficits Awareness: Intellectual Problem Solving: Slow processing, Decreased initiation, Difficulty sequencing, Requires verbal cues, Requires tactile cues General Comments: pt less alert after sit<>stand with Stedy but remained awake and responding with simple clear answers. Mod assist        Exercises Other Exercises Other Exercises: 5 reps bil LE SAQ with bed in partial chair position    General Comments        Pertinent Vitals/Pain Pain Assessment Pain Assessment: No/denies pain Faces Pain Scale: No hurt Pain Intervention(s): Monitored during session, Repositioned    Home Living                          Prior Function            PT Goals (current goals can now be found in the care plan section) Acute Rehab PT Goals Patient Stated Goal: get home and stronger PT Goal Formulation: With patient Time For Goal Achievement: 08/16/22 Potential to Achieve Goals: Fair Progress  towards PT goals: Progressing toward goals    Frequency    Min 1X/week      PT Plan Current plan remains appropriate    Co-evaluation PT/OT/SLP Co-Evaluation/Treatment: Yes Reason for Co-Treatment: To address functional/ADL transfers;For patient/therapist safety PT goals addressed during session: Mobility/safety with mobility;Balance;Strengthening/ROM OT goals addressed during session: ADL's and self-care;Strengthening/ROM      AM-PAC PT "6 Clicks" Mobility   Outcome Measure  Help needed turning from your back to your side while in a flat bed without using bedrails?: Total Help needed moving from lying on your back to sitting on the side of a flat bed without using bedrails?: Total Help needed moving to and from a bed to a chair (including a wheelchair)?: Total Help needed standing up from a chair using your arms (e.g., wheelchair or bedside chair)?: Total Help needed to walk in hospital room?: Total Help needed climbing 3-5 steps with a railing? : Total 6 Click Score: 6    End of Session Equipment Utilized During Treatment: Gait belt Activity Tolerance: Patient limited by fatigue Patient left: in bed;with call bell/phone within reach;with bed alarm set Nurse Communication: Mobility status;Need for lift equipment PT Visit Diagnosis: Other abnormalities of gait and mobility (R26.89);Muscle weakness (generalized) (M62.81);Apraxia (R48.2)     Time: [id number] PT Time Calculation (min) (ACUTE ONLY): 36 min  Charges:  $Therapeutic Activity: 8-22 mins  Verner Mould, DPT Acute Rehabilitation Services Office [phone]  08/07/22 3:18 PM

## 2022-08-07 NOTE — Consult Note (Signed)
Consultation Note Date: 08/07/2022 at 1500  Patient Name: [name]  DOB: 1960-11-01  MRN: [id number]  Age / Sex: 62 y.o., female  PCP: Lowry Ram, MD Referring Physician: Lind Covert, MD  Reason for Consultation: S/p stroke, patient is not eating to discuss code status, management now that patient is not eating   HPI/Patient Profile: 62 y.o. female  with past medical history of ESRD on HD MWF, HTN, T2DM, CVA, paroxysmal VT, HLD, GERD  admitted on 07/23/2022 with initially presenting with pneumonia and NSTEMI, then progressed to having bacterial endocarditis, septic emboli causing stroke.    Clinical Assessment and Goals of Care: I have reviewed medical records including EPIC notes, labs and imaging, assessed the patient and then met with patient and her 2 sisters at bedside to discuss diagnosis prognosis, GOC, EOL wishes, disposition and options.  I introduced Palliative Medicine as specialized medical care for people living with serious illness. It focuses on providing relief from the symptoms and stress of a serious illness. The goal is to improve quality of life for both the patient and the family.  As far as functional and nutritional status patient and sisters endorse patient had a poor appetite at home.  Patient recalls feeling nauseous, vomiting, and then not wanting to eat for 2 to 3 days.  When she started to feel like she could eat again, she would eat a few bites, and the same cycle of nausea with vomiting would continue.  Family states patient has not had more than a few bites and sips of every meal for "quite some time".  We discussed patient's current illness and what it means in the larger context of patient's on-going co-morbidities.  Education provided on NSTEMI, CVA, and risk of cardiovascular events with ESRD on HD.  Natural disease trajectory discussed.  I attempted to elicit  values and goals of care important to the patient.  Patient says she wants to live.  She wants everything done to keep her alive.  Symptoms assessed.  Patient shares that her stomach pain is significantly improved since yesterday.  She has no acute complaints at this time.  Sisters at bedside share that patient was "not herself" yesterday.  However, they believe she is back to almost "90% of her old self".  They share she is almost completely back to baseline as far as cognitive status.  Advance directives, concepts specific to code status, artificial feeding and hydration, and rehospitalization were considered and discussed.  Full code versus DNR status discussed in detail.  Patient shares she would be accepting of all offered, available, and appropriate medical interventions to sustain her life.  Sisters at bedside shared they would like further information to be shared with patient's daughter in order to determine CODE STATUS.  No changes to plan of care at this time.  Full code remains.  Family is facing treatment option decisions, advanced directive, and anticipatory care needs.  Family shares they are interested in CIR and that PT/OT are working closely for appropriate discharge  planning.  Patient's functional, nutritional, and cognitive status discussed is in significant indicators of patient's overall prognosis.  Reviewed importance of nutritional intake and food as "feel" and not for fun.  Patient shares she would do her best to eat and drink what she could but that she just does not have a huge appetite. Renal diet reviewed.    Discussed with patient/family the importance of continued conversation with family and the medical providers regarding overall plan of care and treatment options, ensuring decisions are within the context of the patient's values and GOCs.    Palliative Care services outpatient were explained.   Patient and daughter again expressed they would like for these  discussions and information to be conveyed to daughter.  I placed a call to patient's daughter.  No answer.  HIPAA compliant voicemail left.  Questions and concerns were addressed. The family was encouraged to call with questions or concerns. PMT will continue to follow.   Primary Decision Maker NEXT OF KIN  Physical Exam Constitutional:      General: She is not in acute distress.    Appearance: She is normal weight. She is not ill-appearing.  HENT:     Head: Normocephalic.     Mouth/Throat:     Mouth: Mucous membranes are moist.  Eyes:     Pupils: Pupils are equal, round, and reactive to light.  Cardiovascular:     Rate and Rhythm: Tachycardia present.     Pulses: Normal pulses.  Abdominal:     General: Bowel sounds are normal.     Palpations: Abdomen is soft.  Musculoskeletal:     Comments: Generalized weakness  Skin:    General: Skin is warm and dry.  Neurological:     Mental Status: She is alert and oriented to person, place, and time.  Psychiatric:        Mood and Affect: Mood normal.        Behavior: Behavior normal.        Thought Content: Thought content normal.        Judgment: Judgment normal.     Palliative Assessment/Data: 50%     Thank you for this consult. Palliative medicine will continue to follow and assist holistically.   Time Total: 75 minutes Greater than 50%  of this time was spent counseling and coordinating care related to the above assessment and plan.  Signed by: Jordan Hawks, DNP, FNP-BC Palliative Medicine    Please contact Palliative Medicine Team phone at [phone] for questions and concerns.  For individual provider: See Shea Evans

## 2022-08-07 NOTE — TOC Progression Note (Signed)
Transition of Care [facility]) - Progression Note    Patient Details  Name: [name] MRN: [id number] Date of Birth: March 18, 1961  Transition of Care [facility]) CM/SW [place], [place] Phone Number: 08/07/2022, 10:15 AM  Clinical Narrative:     [employer] rep called CSW requesting facility to be added to SNF auth. CSW provided update that facility had not been chosen; PMT will be meeting with family to discuss [place]. She requests update regarding facility choice if family still plans to pursue SNF.   Expected Discharge Plan: [place] Barriers to Discharge: Continued Medical Work up  Expected Discharge Plan and Services In-house Referral: Clinical Social Work Discharge Planning Services: CM Consult Post Acute Care Choice: IP Rehab Living arrangements for the past 2 months: Apartment                   DME Agency: NA                   Social Determinants of Health (SDOH) Interventions SDOH Screenings   Food Insecurity: No Food Insecurity (07/24/2022)  Housing: Low Risk  (07/24/2022)  Transportation Needs: No Transportation Needs (07/24/2022)  Utilities: Not At Risk (07/24/2022)  Alcohol Screen: Low Risk  (05/31/2021)  Depression (PHQ2-9): Low Risk  (05/02/2022)  Financial Resource Strain: Low Risk  (05/31/2021)  Physical Activity: Sufficiently Active (05/31/2021)  Social Connections: Moderately Integrated (05/31/2021)  Stress: No Stress Concern Present (05/31/2021)  Tobacco Use: Medium Risk (07/28/2022)    Readmission Risk Interventions    07/29/2022    4:53 PM  Readmission Risk Prevention Plan  Transportation Screening Complete  HRI or Home Care Consult Complete  Social Work Consult for [place] Planning/Counseling Complete  Palliative Care Screening Not Applicable  Medication Review Press photographer) Referral to Pharmacy

## 2022-08-07 NOTE — Progress Notes (Signed)
OT Cancellation Note  Patient Details Name: [name] MRN: [id number] DOB: 02/20/1961   Cancelled Treatment:    Reason Eval/Treat Not Completed: Patient at procedure or test/ unavailable (pt at HD)  Renaye Rakers, OTD, OTR/L SecureChat Preferred Acute Rehab [phone] - [place] 08/07/2022, 11:47 AM

## 2022-08-07 NOTE — Progress Notes (Signed)
Per nephrology notes, pt was able to tolerate HD in the chair today. CSW reports that family has accepted bed at [facility]. CSW also states that snf can accommodate pt's HD clinic and schedule. Will assist as needed.   Melven Sartorius Renal Navigator [phone]

## 2022-08-07 NOTE — Progress Notes (Signed)
     Daily Progress Note Intern Pager: [phone]  Patient name: [name] Medical record number: [id number] Date of birth: Sep 24, 1960 Age: 62 y.o. Gender: female  Primary Care Provider: Lowry Ram, MD Consultants: Nephrology, Palliative  Code Status: Full   Pt Overview and Major Events to Date:  3/20 Admitted 3/21 [place] 3/22 TEE 3/23 CT coronary morph, CODE STROKE   Assessment and Plan: Ms. Jakobsen is a 62 year old woman with PMH of ESRD on HD MWF, HTN, T2DM, CVA, paroxysmal VT, HLD, GERD currently admitted for acute bacterial endocarditis, initially presenting with pneumonia and NSTEMI, then progressed to having bacterial endocarditis, septic emboli causing stroke. .    She is now medically stable for discharge to SNF and having ongoing palliative discussions.  * Acute bacterial endocarditis Stable. Afebrile.  - Continue vancomycin with HD.   Stroke due to embolism Continues to be disoriented. Has waxing and waning orientation.  - Aspirin, subQ heparin - Continue delirium precautions - Palliative care consult   Hypertension associated with diabetes BP now stable with midodrine.  - Continue midodrine 5 mg TID  - Continue to monitor  - Holding home medications  ESRD on dialysis HD on MWF, Renal function at baseline reassuringly despite repeat contrast. -Nephrology following -Velphoro, Sensipar  Abdominal pain-resolved as of 08/07/2022 Now resolved. Most likely was due to constipation. Would continue to encourage taking miralax to prevent constipation in the future.     FEN/GI: Dysphagia 1  PPx: heparin  Dispo:SNF pending palliative discussions .   Subjective:  Patient denies abdominal pain or any other complaints. She says she feels well and excited to see her daughter today.   Objective: Temp:  [97.6 F (36.4 C)-98.6 F (37 C)] 97.6 F (36.4 C) (04/03 0836) Pulse Rate:  [79-113] 88 (04/03 0930) Resp:  [16-26] 18 (04/03 0930) BP: (102-157)/(41-77) 124/72  (04/03 0930) SpO2:  [99 %-100 %] 100 % (04/03 0930) Physical Exam: General: Chronically ill, in no acute distress  Cardiovascular: RRR, palpable thrill at AV fistula, currently receiving dialysis  Respiratory: No increased work of breathing Abdomen: Soft, non tender to any quadrant, non distended  Neuro: Alert and oriented to self, place, and year, not situation.   Laboratory: Most recent CBC Lab Results  Component Value Date   WBC 10.1 08/07/2022   HGB 11.5 (L) 08/07/2022   HCT 36.4 08/07/2022   MCV 89.4 08/07/2022   PLT 336 08/07/2022   Most recent BMP    Latest Ref Rng & Units 08/07/2022    6:26 AM  BMP  Glucose 70 - 99 mg/dL 70 - 99 mg/dL 80    78   BUN 8 - 23 mg/dL 8 - 23 mg/dL 48    48   Creatinine 0.44 - 1.00 mg/dL 0.44 - 1.00 mg/dL 9.35    9.49   Sodium 135 - 145 mmol/L 135 - 145 mmol/L 137    136   Potassium 3.5 - 5.1 mmol/L 3.5 - 5.1 mmol/L 4.8    5.1   Chloride 98 - 111 mmol/L 98 - 111 mmol/L 94    94   CO2 22 - 32 mmol/L 22 - 32 mmol/L 26    26   Calcium 8.9 - 10.3 mg/dL 8.9 - 10.3 mg/dL 9.5    9.5     Lowry Ram, MD 08/07/2022, 9:36 AM  PGY-1, [place] Intern pager: [phone], text pages welcome Secure chat group [place]

## 2022-08-07 NOTE — Progress Notes (Signed)
Subjective: Up in recliner in hemodialysis and tolerating.  More animated today and talkative aware "today is Wednesday dialysis today"  Objective Vital signs in last 24 hours: Vitals:   08/07/22 0333 08/07/22 0700 08/07/22 0836 08/07/22 0846  BP: (!) 106/41 121/77 112/70 109/70  Pulse: 93 90 89 79  Resp: 19 16 (!) 22 20  Temp: 97.9 F (36.6 C) 97.8 F (36.6 C) 97.6 F (36.4 C)   TempSrc: Oral Oral Oral   SpO2: 100% 100% 100% 100%  Weight:      Height:       Weight change:   Physical Exam: General: Alert , in recliner hemodialysis chair, chronically ill, appearing adult female  NAD, more animated with speech today  Heart: RRR, 1/6 SEM, no rub or gallop Lungs: CTA anteriorly, nonlabored breathing Abdomen: Obese, soft NABS, NTND Extremities: No pedal edema Dialysis Access: LUA AVF patent on HD     OP dialysis Orders: MWF [place] 4hr, 400/500, EDW 93kg, 2K/2Ca bath, LUE AVF, heparin 4000u + 2000u mid-run bolus - Was getting course of IV iron - Sensipar 90mg  PO q HD - Mircera 30 mcg IV q 2 weeks - last 3/1, not given 3/15 due to Hgb > 11     Problem/Plan: ESRD: Continue HD on usual MWF schedule -on schedule and tolerating  recliner HD today HTN/volume: Recent BP lowish with meds now off/on hold no edema on exam..  With low p.o. 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 NSTEMI: Cardiology workup LHC 3/21 negative, felt that acute HF was due to stress cardiomyopathy (Takotsubo). EF 35-40% on admit -> back up to 60-65% on 07/26/22. 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 Neuro was involved, now signed off /MS waxes and wanes not to baseline./ Multifocal pneumonia: CXR w/ R sided infiltrates. On ABX.  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 Secondary HPTH: CorrCa elevated and Phos 7.4 (diet is heart because of decreased appetite), on   Velphoro - not eating much. Continue sensipar. /PTH lab pending results Nutrition: Alb 3.3,  on Nepro supplements/noted speech involved for dysphagia/cognitive/[place] Per primary team stable for discharge to SNF-per renal team tolerating recliner HD, okay for DC  Ernest Haber, PA-C [facility] Beeper [phone] 08/07/2022,9:01 AM  LOS: 14 days   Labs: Basic Metabolic Panel: Recent Labs  Lab 08/02/22 0645 08/05/22 0637 08/05/22 0835 08/07/22 0626  NA 133* 138  --  136  137  K 4.5 4.8  --  5.1  4.8  CL 91* 93*  --  94*  94*  CO2 26 25  --  26  26  GLUCOSE 87 68*  --  78  80  BUN 40* 56*  --  48*  48*  CREATININE 8.40* 10.72*  --  9.49*  9.35*  CALCIUM 9.6 9.6 9.6 9.5  9.5  PHOS 5.7* 7.1*  --  7.4*   Liver Function Tests: Recent Labs  Lab 08/02/22 0645 08/05/22 0637 08/07/22 0626  AST  --   --  45*  ALT  --   --  69*  ALKPHOS  --   --  58  BILITOT  --   --  0.9  PROT  --   --  7.1  ALBUMIN 3.4* 3.2* 3.3*  3.2*   No results for input(s): "LIPASE", "AMYLASE" in the last 168 hours. No results for  input(s): "AMMONIA" in the last 168 hours. CBC: Recent Labs  Lab 08/02/22 0645 08/05/22 0816 08/07/22 0626  WBC 12.9* 10.2 10.1  HGB 12.2 11.2* 11.5*  HCT 38.0 35.7* 36.4  MCV 88.4 90.6 89.4  PLT 395 316 336   Cardiac Enzymes: No results for input(s): "CKTOTAL", "CKMB", "CKMBINDEX", "TROPONINI" in the last 168 hours. CBG: Recent Labs  Lab 08/04/22 1612  GLUCAP 91    Studies/Results: DG Abd 1 View  Result Date: 08/06/2022 CLINICAL DATA:  Abdominal pain EXAM: ABDOMEN - 1 VIEW COMPARISON:  Radiograph 07/24/2022 FINDINGS: There is no evidence of bowel obstruction. Moderate colonic stool burden. Right upper quadrant surgical clips. Calcified uterine fibroids. IMPRESSION: Nonobstructive bowel gas pattern.  Moderate colonic stool burden. Electronically Signed   By: Maurine Simmering M.D.   On: 08/06/2022 16:01   Medications:  vancomycin Stopped  (08/05/22 1358)    aspirin EC  81 mg Oral Daily   atorvastatin  40 mg Oral Daily   cinacalcet  90 mg Oral Q M,W,F-HD   feeding supplement (NEPRO CARB STEADY)  237 mL Oral BID BM   heparin injection (subcutaneous)  5,000 Units Subcutaneous Q8H   heparin sodium (porcine)  2,000 Units Intravenous Once   lidocaine  1 patch Transdermal Q24H   midodrine  5 mg Oral TID WC   pantoprazole  40 mg Oral Daily   polyethylene glycol  17 g Oral Daily   sucroferric oxyhydroxide  1,000 mg Oral TID WC

## 2022-08-07 NOTE — Progress Notes (Signed)
Received patient in bed to unit.  Alert and oriented.  Informed consent signed and in chart.   [place] duration:4  Patient tolerated well.  Transported back to the room  Alert, without acute distress.  Hand-off given to patient's nurse.   Access used: LEFT AVF Access issues: NONE  Total UF removed: 0 Medication(s) given: NONE COMPLETED HD IN CHAIR   08/07/22 1253  Vitals  Temp (!) 97.5 F (36.4 C)  Temp Source Oral  BP 130/88  MAP (mmHg) 102  BP Location Right Wrist  BP Method Automatic  Patient Position (if appropriate) Sitting  Pulse Rate 100  Pulse Rate Source Monitor  ECG Heart Rate (!) 101  Resp 18  Oxygen Therapy  SpO2 100 %  O2 Device Room Air  During Treatment Monitoring  HD Safety Checks Performed Yes  Intra-Hemodialysis Comments Tx completed;Tolerated well  Dialysis Fluid Bolus Normal Saline  Bolus Amount (mL) 300 mL  Fistula / Graft Left Upper arm Arteriovenous fistula  Placement Date/Time: 02/17/19 1129   Placed prior to admission: No  Orientation: Left  Access Location: Upper arm  Access Type: Arteriovenous fistula  Status Deaccessed;Flushed      Elliot Meldrum S [facility]

## 2022-08-07 NOTE — Progress Notes (Signed)
Occupational Therapy Treatment Patient Details Name: [name] MRN: [id number] DOB: 1961-04-12 Today's Date: 08/07/2022   History of present illness 62 y.o. female presents to [facility] hospital on 07/23/2022 with chest pain after being found down at home, found to have NSTEMI and PNA. Code stroke called 3/23 and MRI revealed multiple scattered subcentimeter acute ischemic infarcts  involving the bilateral cerebral hemispheres and pons.   PMH includes DM, ESRD, GERD, HLD, HTN.   OT comments  Pt progressing towards goals this session, needing set up A for self-feeding, max A +2 for simulated transfer with use of Stedy. Pt attempted standing x3 from HD chair, on 3rd attempt pt able to stand in [place]. Pt with decreased alertness once standing in Stedy, suspect BP drop as pt with increased alertness/communication once seated/supine in bed. Pt afterward reported she did feel dizzy/lightheaded when standing. Pt presenting with impairments listed below, will follow acutely. Patient will benefit from continued inpatient follow up therapy, <3 hours/day to maximize safety/independence with ADLs/functional mobility.   Recommendations for follow up therapy are one component of a multi-disciplinary discharge planning process, led by the attending physician.  Recommendations may be updated based on patient status, additional functional criteria and insurance authorization.    Assistance Recommended at Discharge Frequent or constant Supervision/Assistance  Patient can return home with the following  A lot of help with bathing/dressing/bathroom;Assist for transportation;Direct supervision/assist for medications management;Assistance with cooking/housework;Help with stairs or ramp for entrance;Two people to help with walking and/or transfers;Direct supervision/assist for financial management   Equipment Recommendations  Other (comment) (defer)    Recommendations for Other Services PT consult    Precautions /  Restrictions Precautions Precautions: Fall Precaution Comments: watch BP Restrictions Weight Bearing Restrictions: No       Mobility Bed Mobility Overal bed mobility: Needs Assistance Bed Mobility: Sit to Supine       Sit to supine: +2 for physical assistance, Total assist   General bed mobility comments: Pt possibly orthostatic following sit<>stand from chair to [facility]. Total assist from [facility] to sit back EOb and return to supine to control trunk and bring bil LE's onto bed.    Transfers Overall transfer level: Needs assistance Equipment used: 2 person hand held assist Transfers: Sit to/from Stand, Bed to chair/wheelchair/BSC Sit to Stand: +2 physical assistance, +2 safety/equipment, Max assist     Step pivot transfers: +2 safety/equipment, +2 physical assistance, Max assist     General transfer comment: Pt seated in dialysis chair at start of session. 2+ Max asisst for sit<>stand with 2HHA. PT/Ot blocking bil LE's and facilitating at hips for upright standing. 2x with HHA. Stedy provided for sit<>stand from recliner. pt performed 3x total with Max assist at hips to shift anterior and Rt/Lt for paddle lowering. Once standing pt comminicating well with therapist, after ~30 seconds pt stating "wow" and drifting Rt slightly. pt pivoted in Stedy to move to EOB and Total assist to rise and lower back to sit EOB and return supine. alertness improved in supine and BP stable. Transfer via Lift Equipment: Stedy   Balance Overall balance assessment: Needs assistance Sitting-balance support: Bilateral upper extremity supported, Feet supported Sitting balance-Leahy Scale: Poor Sitting balance - Comments: max A support in sitting EOB   Standing balance support: Bilateral upper extremity supported Standing balance-Leahy Scale: Zero Standing balance comment: dependent on external support  ADL either performed or assessed with clinical judgement   ADL  Overall ADL's : Needs assistance/impaired Eating/Feeding: Set up Eating/Feeding Details (indicate cue type and reason): self-feeds ice cream                     Toilet Transfer: Maximal assistance;+2 for physical assistance Toilet Transfer Details (indicate cue type and reason): with use of Stedy         Functional mobility during ADLs: Maximal assistance;+2 for physical assistance      Extremity/Trunk Assessment Upper Extremity Assessment Upper Extremity Assessment: Generalized weakness LUE Deficits / Details: strength/ROM ability changes with pt's level of alertness, able to use BUE to hold and pull up on Stedy this session and self feed ice cream   Lower Extremity Assessment Lower Extremity Assessment: Defer to PT evaluation        Vision   Vision Assessment?: Yes Eye Alignment: Within Functional Limits Alignment/Gaze Preference: Within Defined Limits Tracking/Visual Pursuits: Decreased smoothness of horizontal tracking;Decreased smoothness of eye movement to LEFT superior field;Decreased smoothness of eye movement to LEFT inferior field;Impaired - to be further tested in functional context;Unable to hold eye position out of midline (cannot hold eye position in L gaze) Visual Fields: Impaired-to be further tested in functional context Additional Comments: identifies numbers held up in different quadrants of visual fields (periphery), looks toward number in R inferior visual field and L superior visual field   Perception Perception Perception: Not tested   Praxis Praxis Praxis: Not tested    Cognition Arousal/Alertness: Awake/alert Behavior During Therapy: Flat affect Overall Cognitive Status: Impaired/Different from baseline Area of Impairment: Attention, Following commands, Problem solving, Awareness, Safety/judgement                   Current Attention Level: Sustained   Following Commands: Follows one step commands inconsistently, Follows one step  commands with increased time, Follows multi-step commands inconsistently, Follows multi-step commands with increased time Safety/Judgement: Decreased awareness of safety, Decreased awareness of deficits Awareness: Intellectual Problem Solving: Slow processing, Decreased initiation, Difficulty sequencing, Requires verbal cues, Requires tactile cues General Comments: more alert upon arrival, decreased alertness once standing in stedy and with heavy R lateral lean, afterward reported she did get lightheaded. benefits from single step command, and decreased external stimuli during session        Exercises      Shoulder Instructions       General Comments VSS on RA    Pertinent Vitals/ Pain       Pain Assessment Pain Assessment: No/denies pain Pain Intervention(s): Limited activity within patient's tolerance, Monitored during session  Home Living                                          Prior Functioning/Environment              Frequency  Min 2X/week        Progress Toward Goals  OT Goals(current goals can now be found in the care plan section)  Progress towards OT goals: Progressing toward goals  Acute Rehab OT Goals Patient Stated Goal: none stated OT Goal Formulation: With patient Time For Goal Achievement: 08/11/22 Potential to Achieve Goals: Good ADL Goals Pt Will Perform Grooming: bed level;sitting;with min guard assist Pt Will Perform Upper Body Dressing: with min assist;sitting;bed level Pt Will Perform Lower Body Dressing: with min assist;sit to/from  stand;with min guard assist;sitting/lateral leans Pt Will Transfer to Toilet: with min guard assist;ambulating;bedside commode Pt Will Perform Toileting - Clothing Manipulation and hygiene: with min guard assist;with min assist;sitting/lateral leans;sit to/from stand Additional ADL Goal #1: pt wil complete bed mobility with min A in prep for ADLs  Plan Frequency remains appropriate;Discharge  plan needs to be updated    Co-evaluation    PT/OT/SLP Co-Evaluation/Treatment: Yes Reason for Co-Treatment: To address functional/ADL transfers;For patient/therapist safety PT goals addressed during session: Mobility/safety with mobility;Balance;Strengthening/ROM OT goals addressed during session: ADL's and self-care;Strengthening/ROM      AM-PAC OT "6 Clicks" Daily Activity     Outcome Measure   Help from another person eating meals?: A Little Help from another person taking care of personal grooming?: A Little Help from another person toileting, which includes using toliet, bedpan, or urinal?: Total Help from another person bathing (including washing, rinsing, drying)?: A Lot Help from another person to put on and taking off regular upper body clothing?: A Lot Help from another person to put on and taking off regular lower body clothing?: Total 6 Click Score: 12    End of Session Equipment Utilized During Treatment: Gait belt;Rolling walker (2 wheels)  OT Visit Diagnosis: Unsteadiness on feet (R26.81);Muscle weakness (generalized) (M62.81);History of falling (Z91.81);Other symptoms and signs involving cognitive function;Pain   Activity Tolerance Patient tolerated treatment well;Patient limited by fatigue   Patient Left in bed;with call bell/phone within reach;with bed alarm set   Nurse Communication Mobility status        Time: [id number] OT Time Calculation (min): 36 min  Charges: OT General Charges $OT Visit: 1 Visit OT Treatments $Self Care/Home Management : 8-22 mins  Renaye Rakers, OTD, OTR/L SecureChat Preferred Acute Rehab [phone] [place] 08/07/2022, 5:22 PM

## 2022-08-08 ENCOUNTER — Ambulatory Visit (HOSPITAL_COMMUNITY): Admission: RE | Admit: 2022-08-08 | Payer: 59 | Source: Home / Self Care | Admitting: Internal Medicine

## 2022-08-08 ENCOUNTER — Encounter (HOSPITAL_COMMUNITY): Admission: RE | Payer: Self-pay | Source: Home / Self Care

## 2022-08-08 DIAGNOSIS — N186 End stage renal disease: Secondary | ICD-10-CM | POA: Diagnosis not present

## 2022-08-08 DIAGNOSIS — J189 Pneumonia, unspecified organism: Secondary | ICD-10-CM | POA: Diagnosis not present

## 2022-08-08 DIAGNOSIS — I330 Acute and subacute infective endocarditis: Secondary | ICD-10-CM | POA: Diagnosis not present

## 2022-08-08 DIAGNOSIS — I6340 Cerebral infarction due to embolism of unspecified cerebral artery: Secondary | ICD-10-CM | POA: Diagnosis not present

## 2022-08-08 SURGERY — ESOPHAGOGASTRODUODENOSCOPY (EGD) WITH PROPOFOL
Anesthesia: Monitor Anesthesia Care

## 2022-08-08 MED ORDER — SUCROFERRIC OXYHYDROXIDE 500 MG PO CHEW: 1000.0000 mg | CHEWABLE_TABLET | Freq: Three times a day (TID) | ORAL | Status: DC

## 2022-08-08 MED ORDER — VANCOMYCIN HCL 750 MG/150ML IV SOLN: 750.0000 mg | INTRAVENOUS | Status: DC

## 2022-08-08 MED ORDER — MIDODRINE HCL 5 MG PO TABS: 5.0000 mg | ORAL_TABLET | Freq: Three times a day (TID) | ORAL | Status: DC

## 2022-08-08 NOTE — Progress Notes (Signed)
Updated receiving nurse Talmadge Chad, LPN about discharge blood thinner being just aspirin and also updated her that velphoro dose was clarified.

## 2022-08-08 NOTE — TOC Transition Note (Addendum)
Transition of Care [facility]) - CM/SW Discharge Note   Patient Details  Name: [name] MRN: [id number] Date of Birth: 1961/02/12  Transition of Care [facility]) CM/SW Contact:  Milas Gain, [place] Phone Number: 08/08/2022, 11:42 AM   Clinical Narrative:     Patient will DC to: [facility] and Rehab  Anticipated DC date: 08/08/2022  Family notified: Canvis   Transport by: Corey Harold   ?  Per MD patient ready for DC to [facility] and Rehab . RN, patient, patient's family, Carloyn Manner Renal Navigator,and facility notified of DC. HD clinic and schedule provided to [facility] at [place].Discharge Summary sent to facility. RN given number for report tele#[phone] . DC packet on chart. Ambulance transport requested for patient.  CSW signing off.   Final next level of care: Skilled Nursing Facility Barriers to Discharge: No Barriers Identified   Patient Goals and CMS Choice CMS Medicare.gov Compare Post Acute Care list provided to:: Patient Represenative (must comment) (Patients daughter Robyne Askew) Choice offered to / list presented to : Adult Children (Patients daughter Robyne Askew)  Discharge Placement                Patient chooses bed at: [facility] and Rehab Patient to be transferred to facility by: [place] Name of family member notified: Canvis Patient and family notified of of transfer: 08/08/22  Discharge Plan and Services Additional resources added to the After Visit Summary for   In-house Referral: Clinical Social Work Discharge Planning Services: CM Consult Post Acute Care Choice: IP Rehab            DME Agency: NA                  Social Determinants of Health (SDOH) Interventions SDOH Screenings   Food Insecurity: No Food Insecurity (07/24/2022)  Housing: Low Risk  (07/24/2022)  Transportation Needs: No Transportation Needs (07/24/2022)  Utilities: Not At Risk (07/24/2022)  Alcohol Screen: Low Risk  (05/31/2021)  Depression (PHQ2-9): Low Risk  (05/02/2022)   Financial Resource Strain: Low Risk  (05/31/2021)  Physical Activity: Sufficiently Active (05/31/2021)  Social Connections: Moderately Integrated (05/31/2021)  Stress: No Stress Concern Present (05/31/2021)  Tobacco Use: Medium Risk (07/28/2022)     Readmission Risk Interventions    07/29/2022    4:53 PM  Readmission Risk Prevention Plan  Transportation Screening Complete  HRI or Home Care Consult Complete  Social Work Consult for [place] Planning/Counseling Complete  Palliative Care Screening Not Applicable  Medication Review Press photographer) Referral to Pharmacy

## 2022-08-08 NOTE — TOC Progression Note (Addendum)
Transition of Care [facility]) - Progression Note    Patient Details  Name: [name] MRN: [id number] Date of Birth: 27-Oct-1960  Transition of Care [facility]) CM/SW [place], [place] Phone Number: 08/08/2022, 9:51 AM  Clinical Narrative:     CSW spoke with [place] with [facility] who informed CSW that they can accept patient today if medically ready.  CSW informed MD. CSW will continue to follow and assist with patients dc planning needs.  Expected Discharge Plan: [place] Barriers to Discharge: Continued Medical Work up  Expected Discharge Plan and Services In-house Referral: Clinical Social Work Discharge Planning Services: CM Consult Post Acute Care Choice: IP Rehab Living arrangements for the past 2 months: Apartment                   DME Agency: NA                   Social Determinants of Health (SDOH) Interventions SDOH Screenings   Food Insecurity: No Food Insecurity (07/24/2022)  Housing: Low Risk  (07/24/2022)  Transportation Needs: No Transportation Needs (07/24/2022)  Utilities: Not At Risk (07/24/2022)  Alcohol Screen: Low Risk  (05/31/2021)  Depression (PHQ2-9): Low Risk  (05/02/2022)  Financial Resource Strain: Low Risk  (05/31/2021)  Physical Activity: Sufficiently Active (05/31/2021)  Social Connections: Moderately Integrated (05/31/2021)  Stress: No Stress Concern Present (05/31/2021)  Tobacco Use: Medium Risk (07/28/2022)    Readmission Risk Interventions    07/29/2022    4:53 PM  Readmission Risk Prevention Plan  Transportation Screening Complete  HRI or Home Care Consult Complete  Social Work Consult for [place] Planning/Counseling Complete  Palliative Care Screening Not Applicable  Medication Review Press photographer) Referral to Pharmacy

## 2022-08-08 NOTE — Care Management Important Message (Signed)
Important Message  Patient Details  Name: [name] MRN: [id number] Date of Birth: 03/11/1961   Medicare Important Message Given:  Yes     Hannah Beat 08/08/2022, 12:13 PM

## 2022-08-08 NOTE — Progress Notes (Addendum)
Report called to [facility], spoke to [place] (Web designer) stated that nurse NAE is on break, phone number left with Rosendo Gros for nurse to call back for report.    Call back received at [phone], report given to [facility], [place].

## 2022-08-08 NOTE — Discharge Summary (Addendum)
[facility] Discharge Summary  Patient name: [name] Medical record number: [id number] Date of birth: 1960/12/12 Age: 62 y.o. Gender: female Date of Admission: 07/23/2022  Date of Discharge: 08/08/22  Admitting Physician: Lind Covert, MD  Primary Care Provider: Lowry Ram, MD Consultants: Neurology, Nephrology, IR, Cardiology  Indication for Hospitalization: Altered mental status   Discharge Diagnoses/Problem List:  Bacterial endocarditis, Septic Embolism causing Stroke, NSTEMI Principal Problem for Admission: Acute bacterial endocarditis  Other Problems addressed during stay:  Principal Problem:   Acute bacterial endocarditis Active Problems:   Hyperlipidemia associated with type 2 diabetes mellitus   Type 2 diabetes mellitus with diabetic chronic kidney disease   Hypertension associated with diabetes   Stroke due to embolism   ESRD on dialysis   Anemia in chronic kidney disease   History of stroke   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 Her hospital course is detailed below:   AMS/Fall Initially presented after fall and altered mental status. AMS and fall thought to be multifactorial in the setting of missed dialysis session, and multiple acute medical problems below. Mental status resolved after resuming dialysis. Further complicated by stroke course below. Patient evaluated by physical and occupational therapy and recommended inpatient rehab.  Multifocal Pneumonia Suspicion of multifocal pneumonia seen on chest x ray in ED. Clinically supported by patient's cough. Patient was started on Azithromycin and Ceftriaxone. Blood cultures were drawn. Patient completed three day  course of Azithromycin. See remaining ID course below. Patient remain stable on room air through discharge.  NSTEMI Patient presented with chest pain and troponin elevated to peak of 6242 and then down-trended. Patient started on heparin gtt. Cardiology was consulted. EKG did not show significant changes from prior. ECHO demonstrated apical hypokinesis, EF 35-40%, and concern for vegetation on aortic valve. Cardiac catheterization demonstrated no obstructive findings. Patient switched to subcutaneous heparin.   Acute bacterial endocarditis Vancomycin added to antibiotic regimen after ECHO resulted with concern for acute bacterial endocarditis. 1/4 initial blood cultures positive for staph species though to be contaminant. 2/4 initial blood cultures positive for corynebacterium. Cardiology ordered TEE to further characterize vegetation seen on ECHO. TEE showed recovered EF 60-65%, mobile mass attached to tricuspid annulus suggestive of vegetation, and mitral annulus calcification with central hypoechogenicity. 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 CT Coronary morph inconclusive for abscess versus caseating MAC. Recommended FDG PET CT. Infectious disease was consulted, ceftriaxone stopped, Vancomycin continued. Cardiothoracic Surgery consulted for possible surgical intervention, but patient not surgical candidate in setting of stroke and other medical problems below. Patient discharged on Vancomycin MWF during dialysis sessions for total antibiotic treatment course of 6 weeks with end date 09/05/2022.  Embolic Stroke On [id number] patient with acute light sided weakness and aphasia concerning for CVA. Code stroke initiated and Neurology consulted but patient was not a candidate for TNK. MRI revealed multiple scattered acute infarcts in bilateral hemispheres and pons likely septic emboli from acute endocarditis. Subsequently underwent stroke rehab with PT/OT/SLP and continued on ASA.   ESRD HD MWF,  renal function worse from baseline upon admission but no reported only missing dialysis session on day of admission. Nephrology consulted and managed HD during hospitalization.  Sternal edema  Patient noted to have non-erythematous, mild edema of right sternal at level of 2nd-4th ribs, with reproducible chest well tenderness. Chest x-ray showed no bony abnormality. CT chest showed no acute abnormalities, resolving opacities and solitary pulmonary nodule to follow up with outpatient.  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 Follow up imaging in 1-2 years.  FDG PET CT. Cardiology follow-up Neurology follow-up. 2 mm right solid pulmonary nodule within the upper lobe. Consider a non-contrast Chest CT at 12 months  Continue to discuss [place] outpatient, patient has poor prognosis long term. Was seen by palliative while inpatient.   Disposition: SNF  Discharge Condition: Stable   Discharge Exam:  Vitals:   08/08/22 0900 08/08/22 1245  BP: 111/86 117/73  Pulse: 81 80  Resp: 17 16  Temp: 98.5 F (36.9 C) (!) 97.5 F (36.4 C)  SpO2: 97% 98%   General: chronically ill appearing, in no acute distress CV: RRR, soft left sternal border systolic II/VI murmur, radial pulses equal and palpable, no BLE edema  Resp: Normal work of breathing on room air, CTAB Abd: Soft, non tender, non distended  Neuro: Alert & Oriented x 4, sometimes confused, can move all extremities equally, no defecits CN2-12.    Significant Procedures: HD  Significant Labs and Imaging:  Recent Labs  Lab 08/07/22 0626  WBC 10.1  HGB 11.5*  HCT 36.4  PLT 336   Recent Labs  Lab 08/07/22 0626  NA 136  137  K 5.1  4.8  CL 94*  94*  CO2 26  26  GLUCOSE 78  80  BUN 48*  48*  CREATININE 9.49*  9.35*  CALCIUM 9.5  9.5  PHOS 7.4*  ALKPHOS 58  AST 45*  ALT 69*   ALBUMIN 3.3*  3.2*    CT Head WO Contrast 07/28/22 1. Abundant intravascular contrast persists, query Renal Insufficiency if no repeat iodinated contrast dose since yesterday. 2. Small embolic cerebral infarcts remain occult by CT. No intracranial mass effect. No definite acute intracranial hemorrhage. 3. Chronic white matter disease. 4. Paranasal sinus inflammation.  MR Brain WO Contrast  1. Multiple scattered subcentimeter acute ischemic infarcts involving the bilateral cerebral hemispheres and pons. No associated hemorrhage or mass effect. A central thromboembolic etiology is suspected given the various vascular distributions involved. 2. Underlying moderately advanced chronic microvascular ischemic disease with a few scattered remote infarcts involving the right frontal lobe and cerebellum.  CT Chest Wo Contrast 07/28/22 1. No evidence for mediastinal stranding or fluid collection. 2. No acute cardiopulmonary process. 3. Borderline cardiomegaly. 4. 2 mm right solid pulmonary nodule within the upper lobe. Per Fleischner Society Guidelines, if patient is low risk for malignancy, no routine follow-up imaging is recommended. If patient is high risk for malignancy, a non-contrast Chest CT at 12 months is optional. If performed and the nodule is stable at 12 months, no further follow-up is recommended. These guidelines do not apply to immunocompromised patients and patients with cancer. Follow up in patients with significant comorbidities as clinically warranted. For lung cancer screening, adhere to Lung-RADS guidelines. Reference: Radiology. 2017; 284(1):228-43.   Aortic Atherosclerosis (ICD10-I70.0).  CT Angio Head Neck W wo Contrast 1. No intracranial large vessel occlusion. 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 2. Approximately 80% stenosis at the origin of the left ICA. Mild stenosis at the left vertebral artery origin and moderate  stenosis in the left V2. No other hemodynamically significant stenosis in the neck. 3. Ground-glass opacities in the imaged right lung, which could be infectious or inflammatory. Consider CT chest for further evaluation. 4. Aortic atherosclerosis.   Aortic Atherosclerosis (ICD10-I70.0).  CT Coronary Morph  Heterogeneous ground-glass opacity throughout visualized portions of both lungs. This finding is nonspecific and could be due to edema, atypical infection, or acute lung injury.   Results/Tests Pending at Time of Discharge: None  Discharge Medications:  Allergies as of 08/08/2022       Reactions   Lisinopril Anaphylaxis, Other (See Comments)   angioedema   Venofer  [ferric Oxide]    Other reaction(s): Back Pain   Camellia Swelling, Other (See Comments)   Angioedema    Jardiance [empagliflozin] Swelling, Rash        Medication List     STOP taking these medications    amLODipine 10 MG tablet Commonly known as: NORVASC   aspirin 325 MG tablet Replaced by: aspirin EC 81 MG tablet   lactulose 10 GM/15ML solution Commonly known as: CHRONULAC   rosuvastatin 5 MG tablet Commonly known as: CRESTOR   traMADol 50 MG tablet Commonly known as: ULTRAM       TAKE these medications    Accu-Chek Aviva Plus w/Device Kit Use to check sugar three times a day What changed:  how much to take how to take this when to take this additional instructions   accu-chek soft touch lancets Use to check sugars three times a day What changed:  how much to take how to take this when to take this additional instructions   Accu-Chek Softclix Lancets lancets Use as instructed What changed:  how much to take how to take this when to take this   acetaminophen 500 MG tablet Commonly known as: TYLENOL Take 1,000 mg by mouth daily as needed for moderate pain.   aspirin EC 81 MG tablet Take 1 tablet (81 mg total) by mouth daily. Swallow whole. Replaces: aspirin 325 MG  tablet   atorvastatin 40 MG tablet Commonly known as: LIPITOR Take 1 tablet (40 mg total) by mouth daily.   calcium carbonate 500 MG chewable tablet Commonly known as: TUMS - dosed in mg elemental calcium Chew 1-2 tablets (200-400 mg of elemental calcium total) by mouth 3 (three) times daily as needed for indigestion or heartburn (Abdominal pain).   cinacalcet 30 MG tablet Commonly known as: SENSIPAR Take 3 tablets (90 mg total) by mouth every Monday, Wednesday, and Friday with hemodialysis.   Daily-Vite Tabs Take 1 tablet by mouth daily.   Darbepoetin Alfa 25 MCG/0.42ML Sosy injection Commonly known as: ARANESP Inject 0.42 mLs (25 mcg total) into the skin every Monday at 6 PM.   feeding supplement (NEPRO CARB STEADY) Liqd Take 237 mLs by mouth 2 (two) times daily between meals.   fluticasone 50 MCG/ACT nasal spray Commonly known as: FLONASE Place 2 sprays into both nostrils daily. What changed:  when to take this reasons to take this   glucose blood test strip Commonly known as: Accu-Chek Aviva Plus Use as instructed What changed:  how much to take how to take this when to take this   isosorbide-hydrALAZINE 20-37.5 MG tablet Commonly known as: BIDIL Take 1 tablet by mouth 2 (two) times daily.   metoCLOPramide 10 MG tablet Commonly known as: Reglan Take 1 tablet (10 mg total) by mouth 4 (four) times daily -  before meals and at bedtime. Take 1/2 to 1 tablet  prn   metoprolol tartrate 25 MG tablet Commonly known as: LOPRESSOR Take 0.5 tablets (12.5 mg total) by mouth 2 (two) times daily.   midodrine 5 MG tablet Commonly known as: PROAMATINE Take 1 tablet (5 mg total) by mouth 3 (three) times daily with meals.   nitroGLYCERIN 0.4 MG SL tablet Commonly known as: NITROSTAT Place 1 tablet (0.4 mg total) under the tongue every 5 (five) minutes as needed for up to 10 days for chest pain.   pantoprazole 40 MG tablet Commonly known as: PROTONIX Take 1 tablet (40 mg  total) by mouth daily.   polyethylene glycol 17 g packet Commonly known as: MIRALAX / GLYCOLAX Take 17 g by mouth daily.   sucroferric oxyhydroxide 500 MG chewable tablet Commonly known as: VELPHORO Chew 2 tablets (1,000 mg total) by mouth 3 (three) times daily with meals. What changed: how much to take   vancomycin 750 MG/150ML Soln Commonly known as: VANCOREADY Inject 150 mLs (750 mg total) into the vein every Monday, Wednesday, and Friday with hemodialysis. Start taking on: August 09, 2022        Discharge Instructions: Please refer to Patient Instructions section of EMR for full details.  Patient was counseled important signs and symptoms that should prompt return to medical care, changes in medications, dietary instructions, activity restrictions, and follow up appointments.   Follow-Up Appointments:  Contact information for follow-up providers     [facility]              Follow up.   Contact information: [address] [place] [phone]        [facility]. Schedule an appointment as soon as possible for a visit.   Specialty: Cardiology Contact information: [address] [place] [id number] [place] [phone]             Contact information for after-discharge care     [facility] .   Service: Skilled Nursing Contact information: [id number] [place] [id number]                     Lowry Ram, MD 08/08/2022, 1:12 PM PGY-1, [place]

## 2022-08-08 NOTE — Progress Notes (Signed)
Subjective: Seen and examined in room, no voiced complaints, "feels good", noted tolerated recliner dialysis yesterday.  Objective Vital signs in last 24 hours: Vitals:   08/08/22 0052 08/08/22 0328 08/08/22 0755 08/08/22 0900  BP: 118/73 120/63 122/67 111/86  Pulse: 90 85 85 81  Resp: 17 18 17 17   Temp: 97.6 F (36.4 C) (!) 97.2 F (36.2 C) 98.7 F (37.1 C) 98.5 F (36.9 C)  TempSrc: Oral Oral Axillary Oral  SpO2: 96% 97% 96% 97%  Weight:      Height:       Weight change:   Physical Exam: General: Alert , in recliner hemodialysis chair, chronically ill, appearing adult female  NAD, more animated with speech today  Heart: RRR, 1/6 SEM, no rub or gallop Lungs: CTA anteriorly, nonlabored breathing Abdomen: Obese, soft NABS, NTND Extremities: No pedal edema Dialysis Access: LUA AVF patent on HD     OP dialysis Orders: MWF [place] 4hr, 400/500, EDW 93kg, 2K/2Ca bath, LUE AVF, heparin 4000u + 2000u mid-run bolus - Was getting course of IV iron - Sensipar 90mg  PO q HD - Mircera 30 mcg IV q 2 weeks - last 3/1, not given 3/15 due to Hgb > 11     Problem/Plan: ESRD: Continue HD on usual MWF schedule -on schedule and tolerating  recliner HD today HTN/volume: BP now stable, //recent BP lowish with meds dc/ no edema on exam..  P.o. 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 NSTEMI: Cardiology workup LHC 3/21 negative, felt that acute HF was due to stress cardiomyopathy (Takotsubo). EF 35-40% on admit -> back up to 60-65% on 07/26/22. 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 Neuro was involved, now signed off /MS waxes and wanes not to baseline./I MS mproving over the last 2 days Multifocal pneumonia: CXR w/ R sided infiltrates. On ABX.  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 Secondary HPTH: CorrCa elevated and Phos 7.4  (diet is heart because of decreased appetite), on  Velphoro - not eating much.  PTH =36 will DC Sensipar  Nutrition: Alb 3.3,  on Nepro supplements/noted speech involved for dysphagia/cognitive/Linquisti Per primary team stable for discharge to SNF-per renal team tolerating recliner HD, okay for DC  Ernest Haber, PA-C [facility] [phone] 08/08/2022,10:07 AM  LOS: 15 days   Labs: Basic Metabolic Panel: Recent Labs  Lab 08/02/22 0645 08/05/22 0637 08/05/22 0835 08/07/22 0626  NA 133* 138  --  136  137  K 4.5 4.8  --  5.1  4.8  CL 91* 93*  --  94*  94*  CO2 26 25  --  26  26  GLUCOSE 87 68*  --  78  80  BUN 40* 56*  --  48*  48*  CREATININE 8.40* 10.72*  --  9.49*  9.35*  CALCIUM 9.6 9.6 9.6 9.5  9.5  PHOS 5.7* 7.1*  --  7.4*   Liver Function Tests: Recent Labs  Lab 08/02/22 0645 08/05/22 0637 08/07/22 0626  AST  --   --  45*  ALT  --   --  69*  ALKPHOS  --   --  58  BILITOT  --   --  0.9  PROT  --   --  7.1  ALBUMIN 3.4* 3.2* 3.3*  3.2*   No results for input(s): "LIPASE", "  AMYLASE" in the last 168 hours. No results for input(s): "AMMONIA" in the last 168 hours. CBC: Recent Labs  Lab 08/02/22 0645 08/05/22 0816 08/07/22 0626  WBC 12.9* 10.2 10.1  HGB 12.2 11.2* 11.5*  HCT 38.0 35.7* 36.4  MCV 88.4 90.6 89.4  PLT 395 316 336   Cardiac Enzymes: No results for input(s): "CKTOTAL", "CKMB", "CKMBINDEX", "TROPONINI" in the last 168 hours. CBG: Recent Labs  Lab 08/04/22 1612  GLUCAP 91    Studies/Results: DG Abd 1 View  Result Date: 08/06/2022 CLINICAL DATA:  Abdominal pain EXAM: ABDOMEN - 1 VIEW COMPARISON:  Radiograph 07/24/2022 FINDINGS: There is no evidence of bowel obstruction. Moderate colonic stool burden. Right upper quadrant surgical clips. Calcified uterine fibroids. IMPRESSION: Nonobstructive bowel gas pattern.  Moderate colonic stool burden. Electronically Signed   By: Maurine Simmering M.D.   On: 08/06/2022 16:01    Medications:  [START ON 08/09/2022] vancomycin      aspirin EC  81 mg Oral Daily   atorvastatin  40 mg Oral Daily   cinacalcet  90 mg Oral Q M,W,F-HD   feeding supplement (NEPRO CARB STEADY)  237 mL Oral BID BM   heparin injection (subcutaneous)  5,000 Units Subcutaneous Q8H   lidocaine  1 patch Transdermal Q24H   midodrine  5 mg Oral TID WC   pantoprazole  40 mg Oral Daily   polyethylene glycol  17 g Oral Daily   sucroferric oxyhydroxide  1,000 mg Oral TID WC

## 2022-08-08 NOTE — Progress Notes (Signed)
Spoke to Dr  Eugene Garnet and clarified velphoro dose (duplicate order with different dosing) as well if patient needed blood thinner at dc, MD states patient is just leaving with aspirin.

## 2022-08-09 ENCOUNTER — Telehealth: Payer: Self-pay | Admitting: Family Medicine

## 2022-08-09 NOTE — Telephone Encounter (Signed)
Daughter dropped off form at front desk for [name].  Verified that patient section of form has been completed.  Last DOS/WCC with PCP was 05/02/22.  Placed form in red team folder to be completed by clinical staff.  Vilinda Blanks

## 2022-08-12 NOTE — Telephone Encounter (Signed)
Placed in MDs box to be filled out. Mann Skaggs, CMA  

## 2022-08-15 NOTE — Telephone Encounter (Signed)
FMLA form placed up front for pick up.   Copy made for batch scanning.   I do not see a ROI. LVM for daughter to pick up form.

## 2022-08-15 NOTE — Progress Notes (Signed)
Cardiology Office Note:    Date:  08/20/2022   ID:  [name], DOB 11-Apr-1961, MRN [id number]  PCP:  Lockie Mola, MD  [facility] Providers Cardiologist:  Lance Muss, MD     Referring MD: Lockie Mola, MD   Chief Complaint:  Hospitalization Follow-up     History of Present Illness:   [name] is a 63 y.o. female with a hx of HTN, HLD, DM 2, CVA, ESRD on HD (MWF).  Patient had a fall and prolonged time on the floor before being found by a family member. Our staff saw her 07/24/2022 for the evaluation of acute acute coronary syndrome. Troponins 6200, cath no obstructive disease, felt to have stress cardiomyopathy EF 35%. Also mobile mass in right atrium and on the side of aortic side of aortic valve in addition involves her mitral annulus with severe MAC. TEE showed recovered EF 60-65%, mobile mass attached to tricuspid annulus suggestive of vegetation, and mitral annulus calcification with central hypoechogenicity. 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 CT Coronary morph inconclusive for abscess versus caseating MAC. Recommended FDG PET CT. Infectious disease was consulted, ceftriaxone stopped, Vancomycin continued. Cardiothoracic Surgery consulted for possible surgical intervention, but patient not surgical candidate in setting of stroke and other medical problems below. Patient discharged on Vancomycin MWF during dialysis sessions for total antibiotic treatment course of 6 weeks with end date 09/05/2022. Dr. Laneta Simmers didn't feel she was a surgical candidate. Antibiotics recommended. She also had a CVA and is now in a rehab facility.  Patient comes in alone from a facility. She struggles with speech and slurring but seems to know what is going on. Says she's not walking yet. No chest pain, dyspnea, dizziness or presyncope.       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   Aortic  atherosclerosis    Arthritis of knee    bilateral   Cerebellar cerebrovascular accident (CVA) without late effect 09/23/2019   Coagulation defect, unspecified 02/19/2019   Diabetes mellitus    type 2 - no meds   Diabetic gastroparesis    Diverticulosis    ESRD (end stage renal disease)    Fibroid uterus    GERD (gastroesophageal reflux disease)    Hyperlipidemia    Hypertension    Hypertensive emergency 02/05/2019   Hypokalemia    IDA (iron deficiency anemia)    Iron deficiency anemia, unspecified 02/24/2019   Nausea    Nausea and vomiting in adult 03/13/2022   Paroxysmal ventricular tachycardia    Renal disorder    Thrombotic stroke involving left cerebellar artery    Umbilical hernia    Wears dentures    Current Medications: Current Meds  Medication Sig   Accu-Chek Softclix Lancets lancets Use as instructed (Patient taking differently: 1 each by Other route See admin instructions. Use as instructed)   acetaminophen (TYLENOL) 500 MG tablet Take 1,000 mg by mouth daily as needed for moderate pain.   aspirin EC 81 MG tablet Take 1 tablet (81 mg total) by mouth daily. Swallow whole.   atorvastatin (LIPITOR) 40 MG tablet Take 1 tablet (40 mg total) by mouth daily.   b complex-vitamin c-folic acid (NEPHRO-VITE) 0.8 MG TABS tablet Take 1 tablet by mouth at bedtime.   Blood Glucose Monitoring Suppl (ACCU-CHEK AVIVA PLUS) w/Device KIT Use to check sugar three times a day (Patient taking differently: 1 each by Other route in the  morning, at noon, and at bedtime.)   calcium carbonate (TUMS - DOSED IN MG ELEMENTAL CALCIUM) 500 MG chewable tablet Chew 1-2 tablets (200-400 mg of elemental calcium total) by mouth 3 (three) times daily as needed for indigestion or heartburn (Abdominal pain).   Darbepoetin Alfa (ARANESP) 25 MCG/0.42ML SOSY injection Inject 0.42 mLs (25 mcg total) into the skin every Monday at 6 PM.   fluticasone (FLONASE) 50 MCG/ACT nasal spray Place 2 sprays into both nostrils  daily. (Patient taking differently: Place 2 sprays into both nostrils daily as needed for allergies.)   glucose blood (ACCU-CHEK AVIVA PLUS) test strip Use as instructed (Patient taking differently: 1 each by Other route See admin instructions. Use as instructed)   isosorbide-hydrALAZINE (BIDIL) 20-37.5 MG tablet Take 1 tablet by mouth 2 (two) times daily.   Lancets (ACCU-CHEK SOFT TOUCH) lancets Use to check sugars three times a day (Patient taking differently: 1 each by Other route in the morning, at noon, and at bedtime.)   metoCLOPramide (REGLAN) 10 MG tablet Take 1 tablet (10 mg total) by mouth 4 (four) times daily -  before meals and at bedtime. Take 1/2 to 1 tablet prn   metoprolol tartrate (LOPRESSOR) 25 MG tablet Take 0.5 tablets (12.5 mg total) by mouth 2 (two) times daily.   midodrine (PROAMATINE) 5 MG tablet Take 1 tablet (5 mg total) by mouth 3 (three) times daily with meals.   pantoprazole (PROTONIX) 40 MG tablet Take 1 tablet (40 mg total) by mouth daily.   polyethylene glycol (MIRALAX / GLYCOLAX) 17 g packet Take 17 g by mouth daily.   vancomycin (VANCOREADY) 750 MG/150ML SOLN Inject 150 mLs (750 mg total) into the vein every Monday, Wednesday, and Friday with hemodialysis.   [DISCONTINUED] Multiple Vitamin (DAILY-VITE) TABS Take 1 tablet by mouth daily.   [DISCONTINUED] Nutritional Supplements (FEEDING SUPPLEMENT, NEPRO CARB STEADY,) LIQD Take 237 mLs by mouth 2 (two) times daily between meals.   [DISCONTINUED] sucroferric oxyhydroxide (VELPHORO) 500 MG chewable tablet Chew 2 tablets (1,000 mg total) by mouth 3 (three) times daily with meals.    Allergies:   Lisinopril, Venofer  [ferric oxide], Camellia, and Jardiance [empagliflozin]   Social History   Tobacco Use   Smoking status: Former    Years: 20    Types: Cigarettes    Quit date: 10/16/2011    Years since quitting: 10.8    Passive exposure: Past   Smokeless tobacco: Never   Tobacco comments:    1 pack will last a week   Vaping Use   Vaping Use: Never used  Substance Use Topics   Alcohol use: No    Alcohol/week: 0.0 standard drinks of alcohol   Drug use: No    Family Hx: The patient's family history includes Cancer in her father; Diabetes in her mother; Hypertension in her mother. There is no history of Colon cancer, Stomach cancer, or Esophageal cancer.  ROS     Physical Exam:    VS:  BP 118/60   Pulse 96   Ht 5\' 10"  (1.778 m)   Wt 200 lb (90.7 kg) Comment: pt stated weight  LMP  (LMP Unknown)   BMI 28.70 kg/m     Wt Readings from Last 3 Encounters:  08/20/22 200 lb (90.7 kg)  08/05/22 195 lb 12.3 oz (88.8 kg)  05/28/22 212 lb (96.2 kg)    Physical Exam  GEN: Well nourished, well developed, in no acute distress  Neck: no JVD, carotid bruits, or masses Cardiac:RRR;  no murmurs, rubs, or gallops  Respiratory:  clear to auscultation bilaterally, normal work of breathing GI: soft, nontender, nondistended, + BS Ext: without cyanosis, clubbing, or edema, Decreased distal pulses bilaterally Neuro:  Alert and Oriented x 3,  Psych: euthymic mood, full affect        EKGs/Labs/Other Test Reviewed:    EKG:  EKG is not ordered today.    Recent Labs: 07/23/2022: Magnesium 2.6 08/07/2022: ALT 69; BUN 48; BUN 48; Creatinine, Ser 9.35; Creatinine, Ser 9.49; Hemoglobin 11.5; Platelets 336; Potassium 4.8; Potassium 5.1; Sodium 137; Sodium 136   Recent Lipid Panel Recent Labs    07/25/22 0158  CHOL 198  TRIG 133  HDL 38*  VLDL 27  LDLCALC 657*     Prior CV Studies:   Transesophageal echo (12-Aug-2022)   IMPRESSIONS     1. Left ventricular ejection fraction, by estimation, is 60 to 65%. The  left ventricle has normal function. The left ventricle has no regional  wall motion abnormalities. There is moderate concentric left ventricular  hypertrophy.   2. Right ventricular systolic function is normal. The right ventricular  size is normal.   3. Left atrial size was mildly dilated. No  left atrial/left atrial  appendage thrombus was detected. The LAA emptying velocity was 70 cm/s.   4. There is a mobile mass in the right atrium (described under tricuspid  valve).   5. There is exuberant mitral annulus calcification, up to 2 cm in  thickness. There is central hypoechogenicity. This could be due to caseous  necrosis or annular abscess. In the medial posterior annulus  (corresponding to the medial scallop (P3), the MAC  appears unusually mobile, suggesting tissue undermining. There is no clear  evidence of communication between the left heart chambers and the mitral  annulus cavity. The mitral valve is degenerative. Mild mitral valve  regurgitation. Severe mitral annular  calcification.   6. There is a highly mobile mass attached to the right atrial wall at the  junction of the tricuspid annulus and the aortic annulus, at the  right-most segment of the right fibrous trigone. It measures 11 mm in  length and 3 mm in thickness and is highly  suggestive of vegetation. The tricuspid valve is abnormal. Tricuspid valve  regurgitation is mild to moderate.   7. There is a tiny filamentous mobile echodensity on the aortic  noncoronary cusp. This is most likely a Lambl's excrescence, although a  vegetation cannot be completely excluded. The large vegetation described  on the transthoracic echo is actually  attached to the tricuspid annulus and is located in the right atrium.  There is no clear evidence of aortic annulus abscess. The aortic valve is  normal in structure. Aortic valve regurgitation is not visualized.   8. There is Moderate (Grade III) layered plaque involving the aortic arch  and descending aorta.   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    Cardiac catheterization (07/25/2022)   gnostic Dominance: Right      Coronary CTA (07/27/2022)    Cardiac CT   TECHNIQUE: The patient was scanned on a Siemens Force 192 slice scanner. A 120 kV retrospective scan was  triggered in the ascending thoracic aorta at 140 HU's. Gantry rotation speed was 250 msecs and collimation was .6 mm. No beta blockade or nitro were given. The 3D data set was reconstructed in 5% intervals of the R-R cycle. Systolic and diastolic phases were analyzed on a dedicated work station using MPR, MIP and VRT modes. The patient received 80 cc of contrast.   FINDINGS: Mild bi atrial enlargement No ASD/VSD/PFO. No pericardial effusion Normal ascending thoracic aorta 2.9 cm Moderate calcific atherosclerosis with normal arch vessels   AV: Tri leaflet No vegetation noted There is a small area of apparent contrast enhancement from non coronary sinus to the RA Cannot r/o fistula but correlates with mobile mass seen on TEE measuring 7.7 mm x 4.1 mm   Non coronary sinus: 2.85 cm   Right coronary sinus: 2.84 cm   Left coronary sinus: 2.74 cm   MV: Markedly abnormal posterior annulus. Mass on ventricular side of leaflet measuring 1.9 cm x 1.5 cm partially calcified but mixed tissue density with HU's 150 to 250 Apears to be some contrast enhancement The posterior leaflet has restricted motion with entire leaflet being thickened 1.2 cm x 2.9 cm The overall appearance appears more to be and annular abscess rather than caseating mitral annular calcification The mass extends posteriorly toward the coronary sinus   The non contrast images show calcified annular edges through out the mitral annulus with edge HU's as high as 650 The core area inside the MAC measures only 60-90 HU's   TV: Not well  seen with contrast wash out in RV   PV: Not well seen with contrast wash out in RVOT   Coronary Arteries: 1-24% calcified plaque in proximal and mid vessel 25-49% calcified plaque in PDA/PLB LM is normal LAD with 25-40% calcified plaque in proximal and mid vessel Normal diagonal and IM branches LCX 1024% soft plaque proximally and 1024% calcified plaque distally .   IMPRESSION: 1. CAD RADS 2 non obstructive CAD see description above study done without nitro or beta blocker   2. 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 Contrast enhancement and large central area of mixed HU's cannot really tell if severe caseating MAC vs annular abscess but latter may be more likely FDG PET CT would likely be best distinguishing test between degenerative disease vs infection/inflammation   3. Contrast enhancing small mass at base of septal TV leaflet near non coronary aortic annulus     Risk Assessment/Calculations/Metrics:              ASSESSMENT & PLAN:   No problem-specific Assessment & Plan notes found for this encounter.   Non-STEMI-troponins rose to 6200.  Cardiac catheterization showed no obstructive CAD suggesting stress cardiomyopathy. No cardiac symptoms    Stress cardiomyopathy-EF initially 35 but up to 60% but normal in follow-up.    End-stage renal disease-on hemodialysis Monday/Wednesday/Friday   Hyperlipidemia-LDL 133 on atorvastatin 40 recently started.    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  She was evaluated by Dr. Laneta SimmersBartle who did not think she was a surgical candidate given her severe comorbidities and extensiveness of her disease.  He recommended continued antibiotic therapy-receives IV Vancomycin during dialysis    Stroke-patient had  multifocal changes on her MRI suggesting embolic event.  She was minimally communicative several days ago but is communicative today.  Agree with antiplatelet therapy and antibiotic therapy for SBE at this time. She is at rehab facilty, some trouble finding words and slurring. Says she's not walking yet.                 Dispo:  No follow-ups on file.   Medication Adjustments/Labs and Tests Ordered: Current medicines are reviewed at length with the patient today.  Concerns regarding medicines are outlined above.  Tests Ordered: No orders of the defined types were placed in this encounter.  Medication Changes: No orders of the defined types were placed in this encounter.  Elson Clan, PA-C  08/20/2022 8:12 AM    [facility] [address] [place], [place], [place]  [address] Phone: [phone]; Fax: [phone]

## 2022-08-16 ENCOUNTER — Ambulatory Visit: Payer: 59 | Admitting: Physician Assistant

## 2022-08-20 ENCOUNTER — Encounter: Payer: Self-pay | Admitting: Physician Assistant

## 2022-08-20 ENCOUNTER — Ambulatory Visit: Payer: 59 | Attending: Physician Assistant | Admitting: Physician Assistant

## 2022-08-20 VITALS — BP 118/60 | HR 96 | Ht 70.0 in | Wt 200.0 lb

## 2022-08-20 DIAGNOSIS — E7849 Other hyperlipidemia: Secondary | ICD-10-CM | POA: Diagnosis not present

## 2022-08-20 DIAGNOSIS — Z992 Dependence on renal dialysis: Secondary | ICD-10-CM

## 2022-08-20 DIAGNOSIS — N186 End stage renal disease: Secondary | ICD-10-CM

## 2022-08-20 DIAGNOSIS — I5181 Takotsubo syndrome: Secondary | ICD-10-CM | POA: Diagnosis not present

## 2022-08-20 DIAGNOSIS — Z8673 Personal history of transient ischemic attack (TIA), and cerebral infarction without residual deficits: Secondary | ICD-10-CM

## 2022-08-20 DIAGNOSIS — I214 Non-ST elevation (NSTEMI) myocardial infarction: Secondary | ICD-10-CM

## 2022-08-20 DIAGNOSIS — I330 Acute and subacute infective endocarditis: Secondary | ICD-10-CM

## 2022-08-20 NOTE — Patient Instructions (Signed)
Medication Instructions:  Your physician recommends that you continue on your current medications as directed. Please refer to the Current Medication list given to you today.  *If you need a refill on your cardiac medications before your next appointment, please call your pharmacy*   Lab Work: None ordered   If you have labs (blood work) drawn today and your tests are completely normal, you will receive your results only by: MyChart Message (if you have MyChart) OR A paper copy in the mail If you have any lab test that is abnormal or we need to change your treatment, we will call you to review the results.   Testing/Procedures: None ordered    Follow-Up: Follow up as scheduled   Other Instructions       

## 2022-08-22 NOTE — Telephone Encounter (Signed)
Dr Rhea Belton- Patient was previously scheduled for endo/colon at [facility] (nausea/vomiting, early satiety, colon cancer screening) 08/08/22 but this had to be cancelled as patient was hospitalized for multiple medical issues including CVA, acute bacterial endocarditis, corynebacterium jeikeium infection. It appears that [name] has since been discharged to a skilled nursing facility and does have residual effects from the stroke including speech difficulty and inability to walk. You previously stated that [name] needed to come to the office to discuss whether [name] could still have procedure following the stroke. Do you want me to place a recall note in the system for office visit in a few months to give her time to improve, or shall I go ahead and add her to the next available appointment space?

## 2022-08-26 NOTE — Telephone Encounter (Signed)
Recall office visit reminder placed in epic for 10/2022.

## 2022-08-26 NOTE — Telephone Encounter (Signed)
Recall appointment for 2 to 3 months from now to allow time for healing

## 2022-08-30 ENCOUNTER — Telehealth: Payer: Self-pay | Admitting: Family Medicine

## 2022-08-30 NOTE — Telephone Encounter (Signed)
Called patient to schedule Medicare Annual Wellness Visit (AWV). Left message for patient to call back and schedule Medicare Annual Wellness Visit (AWV).  Last date of AWV: 05/31/2021   Please schedule an AWVS appointment at any time with [facility] VISIT.  If any questions, please contact me at [phone].   Thank you,  [facility] Direct dial  [phone]

## 2022-09-04 ENCOUNTER — Emergency Department (HOSPITAL_COMMUNITY): Payer: 59

## 2022-09-04 ENCOUNTER — Other Ambulatory Visit: Payer: Self-pay

## 2022-09-04 ENCOUNTER — Observation Stay (HOSPITAL_COMMUNITY)
Admission: EM | Admit: 2022-09-04 | Discharge: 2022-09-05 | Disposition: A | Discharge status: Home / Self Care | Payer: 59 | Attending: Family Medicine | Admitting: Family Medicine

## 2022-09-04 ENCOUNTER — Encounter (HOSPITAL_COMMUNITY): Payer: Self-pay | Admitting: Family Medicine

## 2022-09-04 DIAGNOSIS — R112 Nausea with vomiting, unspecified: Secondary | ICD-10-CM | POA: Diagnosis present

## 2022-09-04 DIAGNOSIS — Z1152 Encounter for screening for COVID-19: Secondary | ICD-10-CM | POA: Diagnosis not present

## 2022-09-04 DIAGNOSIS — Z79899 Other long term (current) drug therapy: Secondary | ICD-10-CM | POA: Insufficient documentation

## 2022-09-04 DIAGNOSIS — Z87891 Personal history of nicotine dependence: Secondary | ICD-10-CM | POA: Diagnosis not present

## 2022-09-04 DIAGNOSIS — I152 Hypertension secondary to endocrine disorders: Secondary | ICD-10-CM | POA: Diagnosis present

## 2022-09-04 DIAGNOSIS — Z7982 Long term (current) use of aspirin: Secondary | ICD-10-CM | POA: Insufficient documentation

## 2022-09-04 DIAGNOSIS — I160 Hypertensive urgency: Secondary | ICD-10-CM

## 2022-09-04 DIAGNOSIS — A498 Other bacterial infections of unspecified site: Secondary | ICD-10-CM | POA: Diagnosis present

## 2022-09-04 DIAGNOSIS — E1159 Type 2 diabetes mellitus with other circulatory complications: Secondary | ICD-10-CM | POA: Diagnosis present

## 2022-09-04 DIAGNOSIS — I120 Hypertensive chronic kidney disease with stage 5 chronic kidney disease or end stage renal disease: Secondary | ICD-10-CM | POA: Diagnosis not present

## 2022-09-04 DIAGNOSIS — E1122 Type 2 diabetes mellitus with diabetic chronic kidney disease: Secondary | ICD-10-CM | POA: Diagnosis not present

## 2022-09-04 DIAGNOSIS — N186 End stage renal disease: Secondary | ICD-10-CM | POA: Diagnosis not present

## 2022-09-04 DIAGNOSIS — Z992 Dependence on renal dialysis: Secondary | ICD-10-CM | POA: Diagnosis not present

## 2022-09-04 LAB — CBC WITH DIFFERENTIAL/PLATELET
Abs Immature Granulocytes: 0.03 10*3/uL (ref 0.00–0.07)
Basophils Absolute: 0 10*3/uL (ref 0.0–0.1)
Basophils Relative: 0 %
Eosinophils Absolute: 0 10*3/uL (ref 0.0–0.5)
Eosinophils Relative: 0 %
HCT: 39.6 % (ref 36.0–46.0)
Hemoglobin: 12.7 g/dL (ref 12.0–15.0)
Immature Granulocytes: 0 %
Lymphocytes Relative: 13 %
Lymphs Abs: 1 10*3/uL (ref 0.7–4.0)
MCH: 28.9 pg (ref 26.0–34.0)
MCHC: 32.1 g/dL (ref 30.0–36.0)
MCV: 90 fL (ref 80.0–100.0)
Monocytes Absolute: 0.4 10*3/uL (ref 0.1–1.0)
Monocytes Relative: 5 %
Neutro Abs: 6.5 10*3/uL (ref 1.7–7.7)
Neutrophils Relative %: 82 %
Platelets: 310 10*3/uL (ref 150–400)
RBC: 4.4 MIL/uL (ref 3.87–5.11)
RDW: 15 % (ref 11.5–15.5)
WBC: 8 10*3/uL (ref 4.0–10.5)
nRBC: 0 % (ref 0.0–0.2)

## 2022-09-04 LAB — LACTIC ACID, PLASMA: Lactic Acid, Venous: 2.4 mmol/L (ref 0.5–1.9)

## 2022-09-04 LAB — COMPREHENSIVE METABOLIC PANEL
ALT: 32 U/L (ref 0–44)
AST: 26 U/L (ref 15–41)
Albumin: 3.4 g/dL — ABNORMAL LOW (ref 3.5–5.0)
Alkaline Phosphatase: 78 U/L (ref 38–126)
Anion gap: 14 (ref 5–15)
BUN: 6 mg/dL — ABNORMAL LOW (ref 8–23)
CO2: 30 mmol/L (ref 22–32)
Calcium: 9.3 mg/dL (ref 8.9–10.3)
Chloride: 92 mmol/L — ABNORMAL LOW (ref 98–111)
Creatinine, Ser: 2.86 mg/dL — ABNORMAL HIGH (ref 0.44–1.00)
GFR, Estimated: 18 mL/min — ABNORMAL LOW (ref >60)
Glucose, Bld: 117 mg/dL — ABNORMAL HIGH (ref 70–99)
Potassium: 3.2 mmol/L — ABNORMAL LOW (ref 3.5–5.1)
Sodium: 136 mmol/L (ref 135–145)
Total Bilirubin: 0.8 mg/dL (ref 0.3–1.2)
Total Protein: 7.5 g/dL (ref 6.5–8.1)

## 2022-09-04 LAB — CBG MONITORING, ED: Glucose-Capillary: 100 mg/dL — ABNORMAL HIGH (ref 70–99)

## 2022-09-04 LAB — MAGNESIUM: Magnesium: 2 mg/dL (ref 1.7–2.4)

## 2022-09-04 LAB — SARS CORONAVIRUS 2 BY RT PCR: SARS Coronavirus 2 by RT PCR: NEGATIVE

## 2022-09-04 LAB — TROPONIN I (HIGH SENSITIVITY)
Troponin I (High Sensitivity): 31 ng/L — ABNORMAL HIGH (ref <18)
Troponin I (High Sensitivity): 31 ng/L — ABNORMAL HIGH (ref <18)

## 2022-09-04 LAB — LIPASE, BLOOD: Lipase: 39 U/L (ref 11–51)

## 2022-09-04 MED ORDER — PANTOPRAZOLE SODIUM 40 MG PO TBEC
40.0000 mg | DELAYED_RELEASE_TABLET | Freq: Every day | ORAL | Status: DC
  Administered 2022-09-05: 40 mg via ORAL
  Filled 2022-09-04: qty 1

## 2022-09-04 MED ORDER — MELATONIN 3 MG PO TABS
3.0000 mg | ORAL_TABLET | Freq: Every day | ORAL | Status: DC
  Administered 2022-09-04: 3 mg via ORAL
  Filled 2022-09-04: qty 1

## 2022-09-04 MED ORDER — ASPIRIN 81 MG PO TBEC
81.0000 mg | DELAYED_RELEASE_TABLET | Freq: Every day | ORAL | Status: DC
  Administered 2022-09-05: 81 mg via ORAL
  Filled 2022-09-04: qty 1

## 2022-09-04 MED ORDER — LABETALOL HCL 5 MG/ML IV SOLN
20.0000 mg | Freq: Once | INTRAVENOUS | Status: AC
  Administered 2022-09-04: 20 mg via INTRAVENOUS
  Filled 2022-09-04: qty 4

## 2022-09-04 MED ORDER — LABETALOL HCL 5 MG/ML IV SOLN: 10.0000 mg | Freq: Once | INTRAVENOUS | Status: DC

## 2022-09-04 MED ORDER — METOPROLOL TARTRATE 25 MG PO TABS: 12.5000 mg | ORAL_TABLET | Freq: Two times a day (BID) | ORAL | Status: DC

## 2022-09-04 MED ORDER — ONDANSETRON HCL 4 MG/2ML IJ SOLN
4.0000 mg | Freq: Once | INTRAMUSCULAR | Status: AC
  Administered 2022-09-04: 4 mg via INTRAVENOUS
  Filled 2022-09-04: qty 2

## 2022-09-04 MED ORDER — METOCLOPRAMIDE HCL 5 MG/ML IJ SOLN
10.0000 mg | INTRAMUSCULAR | Status: AC
  Administered 2022-09-04: 10 mg via INTRAVENOUS
  Filled 2022-09-04: qty 2

## 2022-09-04 MED ORDER — PNEUMOCOCCAL 20-VAL CONJ VACC 0.5 ML IM SUSY
0.5000 mL | PREFILLED_SYRINGE | INTRAMUSCULAR | Status: DC
  Filled 2022-09-04: qty 0.5

## 2022-09-04 MED ORDER — METOCLOPRAMIDE HCL 5 MG/ML IJ SOLN
10.0000 mg | Freq: Four times a day (QID) | INTRAMUSCULAR | Status: DC
  Administered 2022-09-04 – 2022-09-05 (×2): 10 mg via INTRAVENOUS
  Filled 2022-09-04 (×2): qty 2

## 2022-09-04 MED ORDER — IOHEXOL 350 MG/ML SOLN
75.0000 mL | Freq: Once | INTRAVENOUS | Status: AC | PRN
  Administered 2022-09-04: 75 mL via INTRAVENOUS

## 2022-09-04 MED ORDER — DIPHENHYDRAMINE HCL 50 MG/ML IJ SOLN
12.5000 mg | Freq: Once | INTRAMUSCULAR | Status: AC
  Administered 2022-09-04: 12.5 mg via INTRAVENOUS
  Filled 2022-09-04: qty 1

## 2022-09-04 MED ORDER — ATORVASTATIN CALCIUM 40 MG PO TABS
40.0000 mg | ORAL_TABLET | Freq: Every day | ORAL | Status: DC
  Administered 2022-09-05: 40 mg via ORAL
  Filled 2022-09-04: qty 1

## 2022-09-04 MED ORDER — METOPROLOL TARTRATE 25 MG PO TABS
25.0000 mg | ORAL_TABLET | Freq: Two times a day (BID) | ORAL | Status: DC
  Administered 2022-09-04: 25 mg via ORAL
  Filled 2022-09-04: qty 1

## 2022-09-04 MED ORDER — HEPARIN SODIUM (PORCINE) 5000 UNIT/ML IJ SOLN
5000.0000 [IU] | Freq: Three times a day (TID) | INTRAMUSCULAR | Status: DC
  Administered 2022-09-04 – 2022-09-05 (×2): 5000 [IU] via SUBCUTANEOUS
  Filled 2022-09-04 (×2): qty 1

## 2022-09-04 MED ORDER — LACTATED RINGERS IV BOLUS
250.0000 mL | Freq: Once | INTRAVENOUS | Status: AC
  Administered 2022-09-04: 250 mL via INTRAVENOUS

## 2022-09-04 MED ORDER — HYDRALAZINE HCL 25 MG PO TABS
25.0000 mg | ORAL_TABLET | Freq: Once | ORAL | Status: AC
  Administered 2022-09-04: 25 mg via ORAL
  Filled 2022-09-04: qty 1

## 2022-09-04 NOTE — ED Provider Notes (Signed)
[place] EMERGENCY DEPARTMENT AT [facility] Provider Note   CSN: [id number] Arrival date & time: 09/04/22  1045     History  Chief Complaint  Patient presents with   Fatigue    [name] is a 62 y.o. female.  62 year old female with a history of an NSTEMI, HTN, HLD, DM2, and ESRD on Monday Wednesday Friday dialysis who presents emergency department nausea and vomiting.  Patient tells me that she started having nausea and vomiting after dialysis.  Has been nonbloody nonbilious.  Initially denied any abdominal pain but then later in the interview told me that she was having abdominal pain but cannot specify exactly where.  Says that she felt normal last night and that her symptoms did not start until today.  No significant shortness of breath or chest pain.  No fevers.  Denies any diarrhea.  Of note, did tell EMS that she was having the nausea and vomiting prior to dialysis.  Reported to me and EMS that she had completed her session today and tells me that she has been compliant with her dialysis recently.  Has a history of C-section and cholecystectomy.       Home Medications Prior to Admission medications   Medication Sig Start Date End Date Taking? Authorizing Provider  Accu-Chek Softclix Lancets lancets Use as instructed Patient taking differently: 1 each by Other route See admin instructions. Use as instructed 09/29/18   Diallo, Lilia Argue, MD  acetaminophen (TYLENOL) 500 MG tablet Take 1,000 mg by mouth daily as needed for moderate pain.    [provider]  aspirin EC 81 MG tablet Take 1 tablet (81 mg total) by mouth daily. Swallow whole. 07/31/22   Fayette Pho, MD  atorvastatin (LIPITOR) 40 MG tablet Take 1 tablet (40 mg total) by mouth daily. 07/31/22 08/30/22  Fayette Pho, MD  b complex-vitamin c-folic acid (NEPHRO-VITE) 0.8 MG TABS tablet Take 1 tablet by mouth at bedtime.    [provider]  Blood Glucose Monitoring Suppl (ACCU-CHEK AVIVA  PLUS) w/Device KIT Use to check sugar three times a day Patient taking differently: 1 each by Other route in the morning, at noon, and at bedtime. 05/14/17   Palma Holter, MD  calcium carbonate (TUMS - DOSED IN MG ELEMENTAL CALCIUM) 500 MG chewable tablet Chew 1-2 tablets (200-400 mg of elemental calcium total) by mouth 3 (three) times daily as needed for indigestion or heartburn (Abdominal pain). 07/31/22   Fayette Pho, MD  Darbepoetin Alfa (ARANESP) 25 MCG/0.42ML SOSY injection Inject 0.42 mLs (25 mcg total) into the skin every Monday at 6 PM. 08/05/22   Fayette Pho, MD  fluticasone [facility]) 50 MCG/ACT nasal spray Place 2 sprays into both nostrils daily. Patient taking differently: Place 2 sprays into both nostrils daily as needed for allergies. 03/05/22   Lincoln Brigham, MD  glucose blood (ACCU-CHEK AVIVA PLUS) test strip Use as instructed Patient taking differently: 1 each by Other route See admin instructions. Use as instructed 09/29/18   Diallo, Lilia Argue, MD  Lancets (ACCU-CHEK SOFT TOUCH) lancets Use to check sugars three times a day Patient taking differently: 1 each by Other route in the morning, at noon, and at bedtime. 09/24/18   Diallo, Lilia Argue, MD  metoCLOPramide (REGLAN) 10 MG tablet Take 1 tablet (10 mg total) by mouth 4 (four) times daily -  before meals and at bedtime. Take 1/2 to 1 tablet prn 05/28/22   Pyrtle, Carie Caddy, MD  metoprolol tartrate (LOPRESSOR) 25 MG tablet Take  0.5 tablets (12.5 mg total) by mouth 2 (two) times daily. 07/31/22 08/30/22  Fayette Pho, MD  midodrine (PROAMATINE) 5 MG tablet Take 1 tablet (5 mg total) by mouth 3 (three) times daily with meals. 08/08/22   Lockie Mola, MD  nitroGLYCERIN (NITROSTAT) 0.4 MG SL tablet Place 1 tablet (0.4 mg total) under the tongue every 5 (five) minutes as needed for up to 10 days for chest pain. 07/31/22 08/10/22  Fayette Pho, MD  pantoprazole (PROTONIX) 40 MG tablet Take 1 tablet (40 mg total) by mouth daily. 05/28/22    Pyrtle, Carie Caddy, MD  polyethylene glycol (MIRALAX / GLYCOLAX) 17 g packet Take 17 g by mouth daily. 03/16/22   Lance Muss, MD  vancomycin (VANCOREADY) 750 MG/150ML SOLN Inject 150 mLs (750 mg total) into the vein every Monday, Wednesday, and Friday with hemodialysis. 08/09/22   Lockie Mola, MD      Allergies    Lisinopril, Venofer  [ferric oxide], Camellia, and Jardiance [empagliflozin]    Review of Systems   Review of Systems  Physical Exam Updated Vital Signs BP (!) 195/84 (BP Location: Right Arm)   Pulse (!) 111   Temp 99.6 F (37.6 C) (Oral)   Resp (!) 32   Ht 5\' 10"  (1.778 m)   Wt 90.7 kg   LMP  (LMP Unknown)   SpO2 99%   BMI 28.70 kg/m  Physical Exam Vitals and nursing note reviewed.  Constitutional:      General: She is not in acute distress.    Appearance: She is well-developed.     Comments: Holding emesis basin.  Uncomfortable appearing but is conversant and does not appear toxic or in acute distress.  HENT:     Head: Normocephalic and atraumatic.     Right Ear: External ear normal.     Left Ear: External ear normal.     Nose: Nose normal.  Eyes:     Extraocular Movements: Extraocular movements intact.     Conjunctiva/sclera: Conjunctivae normal.     Pupils: Pupils are equal, round, and reactive to light.  Cardiovascular:     Rate and Rhythm: Normal rate and regular rhythm.     Heart sounds: No murmur heard.    Comments: Fistula in left upper extremity with bruit and thrill. Pulmonary:     Effort: Pulmonary effort is normal. No respiratory distress.     Breath sounds: Normal breath sounds.  Abdominal:     General: Abdomen is flat. There is no distension.     Palpations: Abdomen is soft. There is no mass.     Tenderness: There is abdominal tenderness (Minimal, diffuse). There is no guarding.  Musculoskeletal:     Cervical back: Normal range of motion and neck supple.     Right lower leg: Edema present.     Left lower leg: Edema present.  Skin:     General: Skin is warm and dry.  Neurological:     Mental Status: She is alert and oriented to person, place, and time. Mental status is at baseline.  Psychiatric:        Mood and Affect: Mood normal.     ED Results / Procedures / Treatments   Labs (all labs ordered are listed, but only abnormal results are displayed) Labs Reviewed  COMPREHENSIVE METABOLIC PANEL - Abnormal; Notable for the following components:      Result Value   Potassium 3.2 (*)    Chloride 92 (*)    Glucose, Bld 117 (*)  BUN 6 (*)    Creatinine, Ser 2.86 (*)    Albumin 3.4 (*)    GFR, Estimated 18 (*)    All other components within normal limits  CBG MONITORING, ED - Abnormal; Notable for the following components:   Glucose-Capillary 100 (*)    All other components within normal limits  TROPONIN I (HIGH SENSITIVITY) - Abnormal; Notable for the following components:   Troponin I (High Sensitivity) 31 (*)    All other components within normal limits  TROPONIN I (HIGH SENSITIVITY) - Abnormal; Notable for the following components:   Troponin I (High Sensitivity) 31 (*)    All other components within normal limits  LIPASE, BLOOD  CBC WITH DIFFERENTIAL/PLATELET  URINALYSIS, ROUTINE W REFLEX MICROSCOPIC  MAGNESIUM    EKG EKG Interpretation  Date/Time:  Wednesday Sep 04 2022 10:48:59 EDT Ventricular Rate:  101 PR Interval:  160 QRS Duration: 106 QT Interval:  382 QTC Calculation: 496 R Axis:   40 Text Interpretation: Sinus tachycardia Ventricular premature complex Biatrial enlargement Nonspecific repol abnormality, lateral leads Confirmed by Vonita Moss [phone]) on 09/04/2022 10:55:30 AM  Radiology CT ABDOMEN PELVIS W CONTRAST  Result Date: 09/04/2022 CLINICAL DATA:  Abdominal pain.  Nausea vomiting.  Dialysis. EXAM: CT ABDOMEN AND PELVIS WITH CONTRAST TECHNIQUE: Multidetector CT imaging of the abdomen and pelvis was performed using the standard protocol following bolus administration of intravenous  contrast. RADIATION DOSE REDUCTION: This exam was performed according to the departmental dose-optimization program which includes automated exposure control, adjustment of the mA and/or kV according to patient size and/or use of iterative reconstruction technique. CONTRAST:  75mL OMNIPAQUE IOHEXOL 350 MG/ML SOLN COMPARISON:  November 2023 CT scans FINDINGS: Lower chest: Lung bases are clear. No pleural effusion. Small hiatal hernia. Hepatobiliary: No focal liver abnormality is seen. Status post cholecystectomy. No biliary dilatation. Pancreas: Unremarkable. No pancreatic ductal dilatation or surrounding inflammatory changes. Spleen: Normal in size without focal abnormality. Adrenals/Urinary Tract: The adrenal glands are preserved. Moderate atrophy of the kidneys. 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 Small Bosniak 1 right-sided renal cyst posteriorly measuring 14 mm in diameter and Hounsfield unit of 5. No specific imaging follow-up. No collecting system dilatation. The ureters have normal course and caliber down to the bladder. Bladder is underdistended. Stomach/Bowel: The large bowel has a normal course and caliber. Moderate overall colonic stool. Few left-sided colonic diverticula. Significant stool in the rectum. Normal appendix. Stomach is nondilated. Mild luminal fluid and air. Small bowel is nondilated. Vascular/Lymphatic: Diffuse vascular calcifications along the aorta and branch vessels. Areas of stenosis seen at multiple levels including the renal arteries, celiac and SMA. Also significant stenosis along the internal iliac vessels. Reproductive: Multilobular uterus with calcifications consistent with multiple fibroids. Other: Small fat containing umbilical hernia with rectus muscle diastasis. No free air or free fluid. Nonspecific presacral fat stranding. Musculoskeletal: Mild degenerative changes along the spine. IMPRESSION: No bowel obstruction, free air  or free fluid. Diffuse colonic stool. Normal appendix. Nonspecific presacral fat stranding. Of note there is significant stool in the rectum. Atrophic kidneys consistent with known history of chronic renal disease. 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 Prior cholecystectomy.  Small hiatal hernia. Electronically Signed   By: Karen Kays M.D.   On: 09/04/2022 12:32   DG Chest Portable 1 View  Result Date: 09/04/2022 CLINICAL DATA:  Nausea and vomiting.  Abdominal pain. EXAM: PORTABLE CHEST 1 VIEW COMPARISON:  07/27/2022 FINDINGS: Artifact  overlies chest. Mild cardiomegaly. Chronic aortic atherosclerotic calcification. The lungs are clear. The pulmonary vascularity is normal. No infiltrate, collapse or effusion. No abnormal bone finding. IMPRESSION: No active disease. Mild cardiomegaly. Aortic atherosclerotic calcification. Electronically Signed   By: Paulina Fusi M.D.   On: 09/04/2022 11:30    Procedures Procedures   Medications Ordered in ED Medications  labetalol (NORMODYNE) injection 20 mg (has no administration in time range)  ondansetron (ZOFRAN) injection 4 mg (4 mg Intravenous Given 09/04/22 1125)  hydrALAZINE (APRESOLINE) tablet 25 mg (25 mg Oral Given 09/04/22 1148)  iohexol (OMNIPAQUE) 350 MG/ML injection 75 mL (75 mLs Intravenous Contrast Given 09/04/22 1222)  labetalol (NORMODYNE) injection 20 mg (20 mg Intravenous Given 09/04/22 1302)  diphenhydrAMINE (BENADRYL) injection 12.5 mg (12.5 mg Intravenous Given 09/04/22 1619)  metoCLOPramide (REGLAN) injection 10 mg (10 mg Intravenous Given 09/04/22 1619)    ED Course/ Medical Decision Making/ A&P Clinical Course as of 09/04/22 1747  Wed Sep 04, 2022  1652 61 F with ESRD, CAD here with intractable nausea and vomiting. Suspected diabetic gastroparesis.  [VB]  1717 FM to admit.  Spoke to Dr Ardyth Harps. [RP]    Clinical Course User Index [RP] Rondel Baton, MD [VB] Mardene Sayer, MD                              Medical Decision Making Amount and/or Complexity of Data Reviewed Labs: ordered. Radiology: ordered.  Risk Prescription drug management. Decision regarding hospitalization.   EVERLEE QUAKENBUSH is a 61 y.o. female with comorbidities that complicate the patient evaluation including NSTEMI, HTN, HLD, DM, and ESRD on dialysis who presents emergency department with nausea and vomiting and abdominal pain  Initial Ddx:  Unclear exactly what is causing the patient's symptoms.  Could potentially have a viral gastritis.  Unable to fully characterize her abdominal pain but given her age is at risk for atypical presentations of intra-abdominal infections and with her surgical history is predisposed to small bowel obstructions will obtain a CT of the abdomen pelvis today.  With her history of NSTEMI could potentially be an anginal equivalent as well though feel this is less likely.  MDM:  Labs Troponin Lipase EKG Chest x-ray CT abdomen pelvis IV contrast Zofran  Plan:  Labs Lipase Troponin EKG Chest x-ray CT abdomen pelvis IV contrast  ED Summary/Re-evaluation:  Patient underwent the above workup which did not show any acute abnormalities.  Her troponin was mildly elevated but stable at 31.  Feel that acute ischemia is less likely.  CT did not show any acute findings.  Patient required multiple doses of antiemetics and was eventually admitted for intractable nausea and vomiting.  Was also found to be significantly hypertensive and required multiple doses of IV labetalol and was also given hydralazine.  This patient presents to the ED for concern of complaints listed in HPI, this involves an extensive number of treatment options, and is a complaint that carries with it a high risk of complications and morbidity. Disposition including potential need for admission considered.   Dispo: Admit to Floor  Records reviewed Outpatient Clinic Notes The following labs were independently interpreted:  Chemistry and show CKD I independently reviewed the following imaging with scope of interpretation limited to determining acute life threatening conditions related to emergency care: Chest x-ray and agree with the radiologist interpretation with the following exceptions: none I personally reviewed and interpreted cardiac monitoring: normal sinus rhythm  I personally reviewed and interpreted the pt's EKG: see above for interpretation  I have reviewed the patients home medications and made adjustments as needed Consults:  family medicine  Final Clinical Impression(s) / ED Diagnoses Final diagnoses:  Nausea and vomiting, unspecified vomiting type  Hypertensive urgency    Rx / DC Orders ED Discharge Orders     None         Rondel Baton, MD 09/04/22 1747

## 2022-09-04 NOTE — ED Provider Notes (Signed)
Clinical Course as of 09/04/22 1724  Wed Sep 04, 2022  1652 21 F with ESRD, CAD here with intractable nausea and vomiting. Suspected diabetic gastroparesis.  [VB]  1717 FM to admit.  Spoke to Dr Ardyth Harps. [RP]    Clinical Course User Index [RP] Rondel Baton, MD [VB] Mardene Sayer, MD      Mardene Sayer, MD 09/04/22 [phone]

## 2022-09-04 NOTE — Progress Notes (Signed)
Interim Progress Note  S: Patient was asleep.  Daughter and RN were at the bedside.  Daughter was inquiring what triggered her mother to start vomiting. Discussed that this may be from her gastroparesis vs viral GI bug. Seems as though her vomiting has subsided or at least slowed down- RN reports no recurrent episodes since coming to the floor. Patient briefly awoke during examination but did not answer questions, only nodded her head when asked if she was tired.   O: Blood pressure (!) 169/72, pulse 99, temperature 99.6 F (37.6 C), temperature source Oral, resp. rate 20, height 5\' 10"  (1.778 m), weight 90.7 kg, SpO2 97 %. Gen: Asleep, in NAD Cardio:  Resp: Normal WOB on room air  Abd: soft, nttp in all quadrants   A/P:  #N/V  Seems to be improving with medications. Afebrile but will continue to monitor vitals as temp did rise from 97.8 to 99.6. Plan for blood cultures if develops fever. Plan for head imaging if develop HA. Otherwise, supportive care.   #HTN BP 160s/70s currently, improvement from previous pressures in the ED. S/P labetalol, hydralazine from the ED.  -Monitor for now

## 2022-09-04 NOTE — Hospital Course (Signed)
[name] is a 62 y.o.female with a history of NSTEMI, HTN, HLD, DM2, and ESRD on MWF dialysis who was admitted to the [facility] Teaching Service at [facility] for . Her hospital course is detailed below:  Nausea  Vomiting    Other chronic conditions were medically managed with home medications and formulary alternatives as necessary ()  PCP Follow-up Recommendations:

## 2022-09-04 NOTE — Assessment & Plan Note (Signed)
Complete 6 week course of IV Vancomycin during dialysis session today. Previously hospitalized for Endocarditis from 3/19-4/4.

## 2022-09-04 NOTE — ED Notes (Signed)
ED TO INPATIENT HANDOFF REPORT  ED Nurse Name and Phone #: Joya, RN  S Name/Age/Gender [name] 62 y.o. female Room/Bed: 042C/042C  Code Status   Code Status: Full Code  Home/SNF/Other [place] Patient oriented to: self, place, time, and situation Is this baseline? Yes   Triage Complete: Triage complete  Chief Complaint Intractable nausea and vomiting [R11.2]  Triage Note Pt arrived via [facility] from dialysis with c/c of Malaise. Pt EMS opt was feeling malaise since she woke. Pt threw up 6 times but when to dialysis and completed treatment then called EMS. Pt recently d/c from hospital.  CBG 129, 104HR, 100% 180/96    Allergies Allergies  Allergen Reactions   Lisinopril Anaphylaxis and Other (See Comments)    angioedema   Venofer  [Ferric Oxide]     Other reaction(s): Back Pain   Camellia Swelling and Other (See Comments)    Angioedema    Jardiance [Empagliflozin] Swelling and Rash    Level of Care/Admitting Diagnosis ED Disposition     ED Disposition  Admit   Condition  --   Comment  Hospital Area: [facility] [[id number]]  Level of Care: Telemetry Medical [104]  May place patient in observation at [facility] or [facility] if equivalent level of care is available:: No  Covid Evaluation: Asymptomatic - no recent exposure (last 10 days) testing not required  Diagnosis: Intractable nausea and vomiting [720114]  Admitting Physician: Westley Chandler [[id number]]  Attending Physician: Westley Chandler [[id number]]          B Medical/Surgery History 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   Aortic atherosclerosis (HCC)    Arthritis of knee    bilateral   Cerebellar cerebrovascular accident (CVA) without late effect 09/23/2019   Coagulation defect, unspecified (HCC) 02/19/2019   Diabetes mellitus    type 2 - no meds   Diabetic gastroparesis (HCC)    Diverticulosis     ESRD (end stage renal disease) (HCC)    Fibroid uterus    GERD (gastroesophageal reflux disease)    Hyperlipidemia    Hypertension    Hypertensive emergency 02/05/2019   Hypokalemia    IDA (iron deficiency anemia)    Iron deficiency anemia, unspecified 02/24/2019   Nausea    Nausea and vomiting in adult 03/13/2022   Paroxysmal ventricular tachycardia (HCC)    Renal disorder    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)    Umbilical hernia    Wears dentures    Past Surgical History:  Procedure Laterality Date   AV FISTULA PLACEMENT Left 02/17/2019   Procedure: ARTERIOVENOUS (AV) FISTULA CREATION LEFT UPPER ARM;  Surgeon: Maeola Harman, MD;  Location: [facility] OR;  Service: Vascular;  Laterality: Left;   CATARACT EXTRACTION     right eye   CESAREAN SECTION     x2   CHOLECYSTECTOMY     laparoscopic   CORONARY ANGIOGRAPHY N/A 07/25/2022   Procedure: CORONARY ANGIOGRAPHY;  Surgeon: Swaziland, [name], MD;  Location: [facility] INVASIVE CV LAB;  Service: Cardiovascular;  Laterality: N/A;   FISTULA SUPERFICIALIZATION Left 04/06/2019   Procedure: FISTULA SUPERFICIALIZATION LEFT BRACHIOCEPHALIC;  Surgeon: Maeola Harman, MD;  Location: [facility] OR;  Service: Vascular;  Laterality: Left;  Transposition left arm brachiocephalic fistula.   IR FLUORO GUIDE CV LINE RIGHT  02/09/2019   IR FLUORO GUIDE CV LINE RIGHT  03/05/2019   IR  US GUIDE VASC ACCESS RIGHT  02/09/2019   MULTIPLE TOOTH EXTRACTIONS     REDUCTION MAMMAPLASTY Bilateral    TEE WITHOUT CARDIOVERSION N/A 07/26/2022   Procedure: TRANSESOPHAGEAL ECHOCARDIOGRAM (TEE);  Surgeon: Thurmon Fair, MD;  Location: [facility] ENDOSCOPY;  Service: Cardiovascular;  Laterality: N/A;     A IV Location/Drains/Wounds Patient Lines/Drains/Airways Status     Active Line/Drains/Airways     Name Placement date Placement time Site Days   Peripheral IV 09/04/22 20 G 1" Right Antecubital 09/04/22  1112  Antecubital  less than 1   Fistula /  Graft Left Upper arm Arteriovenous fistula 02/17/19  1129  Upper arm  1295            Intake/Output Last 24 hours No intake or output data in the 24 hours ending 09/04/22 1803  Labs/Imaging Results for orders placed or performed during the hospital encounter of 09/04/22 (from the past 48 hour(s))  Comprehensive metabolic panel     Status: Abnormal   Collection Time: 09/04/22 10:53 AM  Result Value Ref Range   Sodium 136 135 - 145 mmol/L   Potassium 3.2 (L) 3.5 - 5.1 mmol/L   Chloride 92 (L) 98 - 111 mmol/L   CO2 30 22 - 32 mmol/L   Glucose, Bld 117 (H) 70 - 99 mg/dL    Comment: Glucose reference range applies only to samples taken after fasting for at least 8 hours.   BUN 6 (L) 8 - 23 mg/dL   Creatinine, Ser 1.61 (H) 0.44 - 1.00 mg/dL   Calcium 9.3 8.9 - 09.6 mg/dL   Total Protein 7.5 6.5 - 8.1 g/dL   Albumin 3.4 (L) 3.5 - 5.0 g/dL   AST 26 15 - 41 U/L   ALT 32 0 - 44 U/L   Alkaline Phosphatase 78 38 - 126 U/L   Total Bilirubin 0.8 0.3 - 1.2 mg/dL   GFR, Estimated 18 (L) >60 mL/min    Comment: (NOTE) Calculated using the CKD-EPI Creatinine Equation (2021)    Anion gap 14 5 - 15    Comment: Performed at [facility], [address] [place], [place] [address]  Lipase, blood     Status: None   Collection Time: 09/04/22 10:53 AM  Result Value Ref Range   Lipase 39 11 - 51 U/L    Comment: Performed at [facility], [address], [place], [place] [address]  CBC with Diff     Status: None   Collection Time: 09/04/22 10:53 AM  Result Value Ref Range   WBC 8.0 4.0 - 10.5 K/uL   RBC 4.40 3.87 - 5.11 MIL/uL   Hemoglobin 12.7 12.0 - 15.0 g/dL   HCT 14.7 82.9 - 56.2 %   MCV 90.0 80.0 - 100.0 fL   MCH 28.9 26.0 - 34.0 pg   MCHC 32.1 30.0 - 36.0 g/dL   RDW 13.0 86.5 - 78.4 %   Platelets 310 150 - 400 K/uL   nRBC 0.0 0.0 - 0.2 %   Neutrophils Relative % 82 %   Neutro Abs 6.5 1.7 - 7.7 K/uL   Lymphocytes Relative 13 %   Lymphs Abs 1.0 0.7 - 4.0 K/uL    Monocytes Relative 5 %   Monocytes Absolute 0.4 0.1 - 1.0 K/uL   Eosinophils Relative 0 %   Eosinophils Absolute 0.0 0.0 - 0.5 K/uL   Basophils Relative 0 %   Basophils Absolute 0.0 0.0 - 0.1 K/uL   Immature Granulocytes 0 %  Abs Immature Granulocytes 0.03 0.00 - 0.07 K/uL    Comment: Performed at [facility], [address], [place], [place] [address]  Troponin I (High Sensitivity)     Status: Abnormal   Collection Time: 09/04/22 10:53 AM  Result Value Ref Range   Troponin I (High Sensitivity) 31 (H) <18 ng/L    Comment: (NOTE) Elevated high sensitivity troponin I (hsTnI) values and significant  changes across serial measurements may suggest ACS but many other  chronic and acute conditions are known to elevate hsTnI results.  Refer to the "Links" section for chest pain algorithms and additional  guidance. Performed at [facility], [address], [place], [place] [address]   POC CBG, ED     Status: Abnormal   Collection Time: 09/04/22 11:11 AM  Result Value Ref Range   Glucose-Capillary 100 (H) 70 - 99 mg/dL    Comment: Glucose reference range applies only to samples taken after fasting for at least 8 hours.   Comment 1 Notify RN    Comment 2 Document in Chart   Troponin I (High Sensitivity)     Status: Abnormal   Collection Time: 09/04/22  1:08 PM  Result Value Ref Range   Troponin I (High Sensitivity) 31 (H) <18 ng/L    Comment: (NOTE) Elevated high sensitivity troponin I (hsTnI) values and significant  changes across serial measurements may suggest ACS but many other  chronic and acute conditions are known to elevate hsTnI results.  Refer to the "Links" section for chest pain algorithms and additional  guidance. Performed at [facility], [address], [place], [place] [address]    CT ABDOMEN PELVIS W CONTRAST  Result Date: 09/04/2022 CLINICAL DATA:  Abdominal pain.  Nausea vomiting.  Dialysis. EXAM: CT ABDOMEN AND PELVIS WITH CONTRAST TECHNIQUE:  Multidetector CT imaging of the abdomen and pelvis was performed using the standard protocol following bolus administration of intravenous contrast. RADIATION DOSE REDUCTION: This exam was performed according to the departmental dose-optimization program which includes automated exposure control, adjustment of the mA and/or kV according to patient size and/or use of iterative reconstruction technique. CONTRAST:  75mL OMNIPAQUE IOHEXOL 350 MG/ML SOLN COMPARISON:  November 2023 CT scans FINDINGS: Lower chest: Lung bases are clear. No pleural effusion. Small hiatal hernia. Hepatobiliary: No focal liver abnormality is seen. Status post cholecystectomy. No biliary dilatation. Pancreas: Unremarkable. No pancreatic ductal dilatation or surrounding inflammatory changes. Spleen: Normal in size without focal abnormality. Adrenals/Urinary Tract: The adrenal glands are preserved. Moderate atrophy of the kidneys. 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 Small Bosniak 1 right-sided renal cyst posteriorly measuring 14 mm in diameter and Hounsfield unit of 5. No specific imaging follow-up. No collecting system dilatation. The ureters have normal course and caliber down to the bladder. Bladder is underdistended. Stomach/Bowel: The large bowel has a normal course and caliber. Moderate overall colonic stool. Few left-sided colonic diverticula. Significant stool in the rectum. Normal appendix. Stomach is nondilated. Mild luminal fluid and air. Small bowel is nondilated. Vascular/Lymphatic: Diffuse vascular calcifications along the aorta and branch vessels. Areas of stenosis seen at multiple levels including the renal arteries, celiac and SMA. Also significant stenosis along the internal iliac vessels. Reproductive: Multilobular uterus with calcifications consistent with multiple fibroids. Other: Small fat containing umbilical hernia with rectus muscle diastasis. No free air or free fluid.  Nonspecific presacral fat stranding. Musculoskeletal: Mild degenerative changes along the spine. IMPRESSION: No bowel  obstruction, free air or free fluid. Diffuse colonic stool. Normal appendix. Nonspecific presacral fat stranding. Of note there is significant stool in the rectum. Atrophic kidneys consistent with known history of chronic renal disease. 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 Prior cholecystectomy.  Small hiatal hernia. Electronically Signed   By: Karen Kays M.D.   On: 09/04/2022 12:32   DG Chest Portable 1 View  Result Date: 09/04/2022 CLINICAL DATA:  Nausea and vomiting.  Abdominal pain. EXAM: PORTABLE CHEST 1 VIEW COMPARISON:  07/27/2022 FINDINGS: Artifact overlies chest. Mild cardiomegaly. Chronic aortic atherosclerotic calcification. The lungs are clear. The pulmonary vascularity is normal. No infiltrate, collapse or effusion. No abnormal bone finding. IMPRESSION: No active disease. Mild cardiomegaly. Aortic atherosclerotic calcification. Electronically Signed   By: Paulina Fusi M.D.   On: 09/04/2022 11:30    Pending Labs Unresulted Labs (From admission, onward)     Start     Ordered   09/04/22 1756  SARS Coronavirus 2 by RT PCR (hospital order, performed in [facility] hospital lab) *cepheid single result test* Anterior Nasal Swab  (Tier 2 - SARS Coronavirus 2 by RT PCR (hospital order, performed in [facility] hospital lab) *cepheid single result test*)  Once,   R        09/04/22 1755   09/04/22 1700  Magnesium  Add-on,   AD        09/04/22 1659   09/04/22 1053  Urinalysis, Routine w reflex microscopic -Urine, Clean Catch  Once,   URGENT       Question:  Specimen Source  Answer:  Urine, Clean Catch   09/04/22 1054            Vitals/Pain Today's Vitals   09/04/22 1600 09/04/22 1615 09/04/22 1623 09/04/22 1750  BP: (!) 191/88 (!) 195/84  (!) 190/75  Pulse:  (!) 111  (!) 109  Resp: (!) 34 (!) 32  (!) 27  Temp:   99.6 F (37.6 C)   TempSrc:   Oral    SpO2:  99%  96%  Weight:      Height:      PainSc:        Isolation Precautions Airborne and Contact precautions  Medications Medications  aspirin EC tablet 81 mg (has no administration in time range)  atorvastatin (LIPITOR) tablet 40 mg (has no administration in time range)  metoprolol tartrate (LOPRESSOR) tablet 12.5 mg (has no administration in time range)  pantoprazole (PROTONIX) EC tablet 40 mg (has no administration in time range)  heparin injection 5,000 Units (has no administration in time range)  ondansetron (ZOFRAN) injection 4 mg (4 mg Intravenous Given 09/04/22 1125)  hydrALAZINE (APRESOLINE) tablet 25 mg (25 mg Oral Given 09/04/22 1148)  iohexol (OMNIPAQUE) 350 MG/ML injection 75 mL (75 mLs Intravenous Contrast Given 09/04/22 1222)  labetalol (NORMODYNE) injection 20 mg (20 mg Intravenous Given 09/04/22 1302)  diphenhydrAMINE (BENADRYL) injection 12.5 mg (12.5 mg Intravenous Given 09/04/22 1619)  metoCLOPramide (REGLAN) injection 10 mg (10 mg Intravenous Given 09/04/22 1619)  labetalol (NORMODYNE) injection 20 mg (20 mg Intravenous Given 09/04/22 1751)    Mobility manual wheelchair     Focused Assessments Renal Assessment Handoff:  Hemodialysis Schedule: Hemodialysis Schedule: Monday/Wednesday/Friday Last Hemodialysis date and time: today   Restricted appendage: left arm   R Recommendations: See Admitting Provider Note  Report given to:   Additional Notes:

## 2022-09-04 NOTE — ED Triage Notes (Signed)
Pt arrived via [facility] from dialysis with c/c of Malaise. Pt EMS opt was feeling malaise since she woke. Pt threw up 6 times but when to dialysis and completed treatment then called EMS. Pt recently d/c from hospital.  CBG 129, 104HR, 100% 180/96

## 2022-09-04 NOTE — Assessment & Plan Note (Signed)
Dialysis MWF, reports she has not missed a session. -Will need Nephrology consult if hospitalized on Friday

## 2022-09-04 NOTE — Assessment & Plan Note (Addendum)
Significant improvement from admission and now having soft Bps. Asymptomatic. She was increased to Metoprolol 25mg  BID but will decrease back to home dose of Metoprolol 12.5mg  BID. -Decrease to Metoprolol 12.5mg  BID  -Restart home Midodrine 5mg  TID

## 2022-09-04 NOTE — H&P (Cosign Needed Addendum)
Hospital Admission History and Physical Service Pager: [phone]  Patient name: [name] Medical record number: [id number] Date of Birth: [birth year] Age: 62 y.o. Gender: female  Primary Care Provider: Lockie Mola, MD Consultants: None Code Status: FULL  Preferred Emergency Contact:  Contact Information     Name Relation Home Work Mobile   [place] Daughter [phone]          Chief Complaint: N/V  Assessment and Plan: [name] is a 62 y.o. female has PMHx ESRD, HTN, T2DM, NSTEMI presenting with N/V. Differential for this patient's presentation of this includes infection, cardiac etiology, SBO/ileus, pancreatitis (reassuringly CT abdomen nml, abdominal labs nml).    * Intractable nausea and vomiting N/V since this morning, unable to tolerate PO. Ill-appearing but hydrated upon exam. Tachycardia but afebrile. CT abdomen and CXR unremarkable. Labs reassuring. Most likely food poisoning vs viral gastroenteritis given sudden onset. -Admitted to [facility] med tele, Dr. Manson Passey attending -Reglan 10mg  QID (home dosing) -Continue home Protonix 40mg  daily -COVID testing -Monitor hydration status closely, hold on fluids given hydrated and h/o ESRD  Hypertension associated with diabetes (HCC) Hypertensive to 190s upon admission. Taking Midodrine 5mg  TID for previously low blood pressure. Reports checking BP at home, typically in 150-160s without any pressures below 140 systolic. Asymptomatic -Restart home Metoprolol 12.5mg  BID  -Consider Nephrology input for further BP management  Type 2 diabetes mellitus with diabetic chronic kidney disease (HCC) A1c 5.4 in 07/2022 and not on medication outpatient. Minimal PO intake given current illness -Consider BGL as diet advances  ESRD on dialysis [facility]) Dialysis MWF, completed session today. 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 Previously hospitalized for Endocarditis from 3/19-4/4.   Chronic stable conditions: HLD: continue Lipitor 40mg  daily  FEN/GI: Clear liquid diet VTE Prophylaxis: Heparin  Disposition: Med tele  History of Present Illness:  [name] is a 62 y.o. female presenting with nausea and vomiting that began this morning. Associated with stomach pain, but no dyspnea or chest pain. Last BM Monday, constipated at baseline. Ate last night, but now unable to eat or drink over the course of the day. Denies new restaurants or foods and sick contacts. Denies fevers. Some cough with sputum production and congestion that started today. Denies hematochezia or hematemesis.   She has not missed any doses of medications, has been recovering well from last hospitalization. On dialysis MWF, completed full session today. On midodrine at home three times a day, reports compliance. Home BP 160s systolic, 150s in dialysis.  In the ED, received EKG, trop trended flat, Zofran, metoclopramide, diphenhydramine, lipase nml, CMP with K 3.2, Cr 2.86, CBC nml, CBG 100.   Review Of Systems: Per HPI above  Pertinent Past Medical History: T2DM ESRD - MWF GERD HLD HTN Paroxysmal Ventricular Tachycardia  Remainder reviewed in history tab.   Pertinent Past Surgical History: AV Fistula 02/17/2019 Cataract extraction right eye Cesarean section x 2 Cholecystectomy Reduction mammaplasty Remainder reviewed in history tab.   Pertinent Social History: Tobacco use: Former, 20 pack year history. Quit in 2013 Alcohol use: None Other Substance use: None Lives with daughter  Pertinent Family History: Mother: HTN, DM Father: lung cancer Remainder reviewed in history tab.   Important Outpatient Medications: ASA 81mg  daily Lipitor 40mg  daily Aranesp Flonase Reglan 10mg  QID Metoprolol 12.5mg  BID Midodrine 5mg  TID Protonix 40mg  daily Vancomycin with dialysis - completed today Remainder  reviewed in medication history.   Objective: BP (!) 190/75 (BP Location: Right Arm)   Pulse (!) 109   Temp 99.6 F (37.6 C) (Oral)   Resp (!) 27   Ht 5\' 10"  (1.778 m)   Wt 90.7 kg   LMP  (LMP Unknown)   SpO2 96%   BMI 28.70 kg/m  Exam: General: Ill-appearing, alert, listless gazing in the room Eyes: PERRLA, conjunctival injection ENTM: Moist mucus membranes. Foamy spit present on lips. Towels with yellow-green vomit present. Neck: Supple, non-tender Cardiovascular: RRR without murmur Respiratory: CTAB. Normal WOB on RA Gastrointestinal: Soft, non-distended, mildly TTP diffusely MSK: No peripheral edema Derm: Warm, dry, no rashes noted Neuro: CN intact. Motor and sensation intact globally Psych: Cooperative, pleasant   Labs:  CBC BMET  Recent Labs  Lab 09/04/22 1053  WBC 8.0  HGB 12.7  HCT 39.6  PLT 310   Recent Labs  Lab 09/04/22 1053  NA 136  K 3.2*  CL 92*  CO2 30  BUN 6*  CREATININE 2.86*  GLUCOSE 117*  CALCIUM 9.3    Pertinent additional labs: Troponin: 31>31 Lipase: 39  EKG: Tachycardia with one PVC   Imaging Studies Performed: CT ABDOMEN PELVIS W CONTRAST Result Date: 09/04/2022 IMPRESSION: No bowel obstruction, free air or free fluid. Diffuse colonic stool. Normal appendix. Nonspecific presacral fat stranding. Of note there is significant stool in the rectum. Atrophic kidneys consistent with known history of chronic renal disease. 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 Prior cholecystectomy.  Small hiatal hernia.  DG Chest Portable 1 View Result Date: 09/04/2022 IMPRESSION: No active disease. Mild cardiomegaly. Aortic atherosclerotic calcification.   Elberta Fortis, MD 09/04/2022, 6:19 PM PGY-1, [facility] Family Medicine  FPTS Intern pager: [phone], text pages welcome Secure chat group [facility] Teaching Service   Upper Level Addendum:  I have seen and evaluated this patient along with Dr. Ardyth Harps and  reviewed the above note, making necessary revisions as appropriate.  I agree with the medical decision making and physical exam as noted above.  Alfredo Martinez, MD PGY-2 [facility] Family Medicine Residency

## 2022-09-04 NOTE — Assessment & Plan Note (Addendum)
Well controlled at baseline (A1c 5.4 in 07/2022), consider adding CBG checks as diet advances.

## 2022-09-04 NOTE — Assessment & Plan Note (Addendum)
No further episodes of vomiting reports. Advance patient diet as tolerated. COVID negative. -Reglan 10mg  QID -Continue home Protonix 40mg  daily -Monitor hydration status closely, hold on fluids given hydrated and h/o ESRD

## 2022-09-05 ENCOUNTER — Encounter (HOSPITAL_COMMUNITY): Payer: Self-pay | Admitting: Family Medicine

## 2022-09-05 ENCOUNTER — Inpatient Hospital Stay: Payer: 59 | Admitting: Internal Medicine

## 2022-09-05 DIAGNOSIS — I160 Hypertensive urgency: Secondary | ICD-10-CM

## 2022-09-05 DIAGNOSIS — R112 Nausea with vomiting, unspecified: Secondary | ICD-10-CM | POA: Diagnosis not present

## 2022-09-05 LAB — BASIC METABOLIC PANEL
Anion gap: 14 (ref 5–15)
BUN: 13 mg/dL (ref 8–23)
CO2: 31 mmol/L (ref 22–32)
Calcium: 9.6 mg/dL (ref 8.9–10.3)
Chloride: 92 mmol/L — ABNORMAL LOW (ref 98–111)
Creatinine, Ser: 4.72 mg/dL — ABNORMAL HIGH (ref 0.44–1.00)
GFR, Estimated: 10 mL/min — ABNORMAL LOW (ref >60)
Glucose, Bld: 74 mg/dL (ref 70–99)
Potassium: 3.8 mmol/L (ref 3.5–5.1)
Sodium: 137 mmol/L (ref 135–145)

## 2022-09-05 LAB — CBC
HCT: 28.9 % — ABNORMAL LOW (ref 36.0–46.0)
Hemoglobin: 9.3 g/dL — ABNORMAL LOW (ref 12.0–15.0)
MCH: 28.6 pg (ref 26.0–34.0)
MCHC: 32.2 g/dL (ref 30.0–36.0)
MCV: 88.9 fL (ref 80.0–100.0)
Platelets: 254 10*3/uL (ref 150–400)
RBC: 3.25 MIL/uL — ABNORMAL LOW (ref 3.87–5.11)
RDW: 15.5 % (ref 11.5–15.5)
WBC: 6.9 10*3/uL (ref 4.0–10.5)
nRBC: 0 % (ref 0.0–0.2)

## 2022-09-05 LAB — LACTIC ACID, PLASMA: Lactic Acid, Venous: 1.6 mmol/L (ref 0.5–1.9)

## 2022-09-05 MED ORDER — METOCLOPRAMIDE HCL 5 MG PO TABS
10.0000 mg | ORAL_TABLET | Freq: Three times a day (TID) | ORAL | Status: DC
  Administered 2022-09-05: 10 mg via ORAL
  Filled 2022-09-05: qty 2

## 2022-09-05 MED ORDER — METOPROLOL TARTRATE 12.5 MG HALF TABLET
12.5000 mg | ORAL_TABLET | Freq: Two times a day (BID) | ORAL | Status: DC
  Filled 2022-09-05: qty 1

## 2022-09-05 MED ORDER — MIDODRINE HCL 5 MG PO TABS: 5.0000 mg | ORAL_TABLET | Freq: Three times a day (TID) | ORAL | Status: DC

## 2022-09-05 NOTE — Discharge Instructions (Signed)
Dear [name],   Thank you for letting us participate in your care! In this section, you will find a brief hospital admission summary of why you were admitted to the hospital, what happened during your admission, your diagnosis/diagnoses, and recommended follow up.  Primary diagnosis: Nausea and vomiting Treatment plan: You were treated with supportive care for your nausea and vomiting. It is likely it was due to a brief viral illness or food poisoning.  POST-HOSPITAL & CARE INSTRUCTIONS We recommend following up with your PCP within 1 week from being discharged from the hospital. Please let PCP/Specialists know of any changes in medications that were made which you will be able to see in the medications section of this packet.  DOCTOR'S APPOINTMENTS & FOLLOW UP Future Appointments  Date Time Provider Department Center  09/05/2022 11:00 AM Judyann Munson, MD [facility]  10/31/2022  9:30 AM Micki Riley, MD [facility] None  11/05/2022  9:30 AM Corky Crafts, MD [facility]     Thank you for choosing [facility]! Take care and be well!  Family Medicine Teaching Service Inpatient Team [place]  [facility]  [address] [place], [place] [address] [phone]

## 2022-09-05 NOTE — TOC Transition Note (Signed)
Transition of Care [facility]) - CM/SW Discharge Note   Patient Details  Name: [name] MRN: [id number] Date of Birth: 1961/04/12  Transition [facility] Care [facility]) CM/SW Contact:  Baker Kogler A [place], Theresia Majors Phone Number: 09/05/2022, 3:15 PM   Clinical Narrative:     CSW met with pt at beside. She stated she was ready to go back to [place]. CSW will assist with transportation at discharge.   Patient will DC to: [facility]  Anticipated DC date: 09/05/22  Family notified: Anne Fu  Transport by: Sharin Mons      Per MD patient ready for DC to [facility] . RN, patient, patient's family, and facility notified of DC. Discharge Summary sent to facility. RN to call report prior to discharge  ([phone], Room 230B) DC packet on chart. Ambulance transport requested for patient.     CSW will sign off for now as social work intervention is no longer needed. Please consult [place] again if new needs arise.   Final next level of care: Long Term Nursing Home Barriers to Discharge: No Barriers Identified   Patient Goals and CMS Choice      Discharge Placement                  Patient to be transferred to facility by: [facility] Name of family member notified: Anne Fu Patient and family notified of of transfer: 09/05/22  Discharge Plan and Services Additional resources added to the After Visit Summary for                                       Social Determinants of Health (SDOH) Interventions SDOH Screenings   Food Insecurity: No Food Insecurity (09/04/2022)  Housing: Low Risk  (09/04/2022)  Transportation Needs: No Transportation Needs (09/04/2022)  Utilities: Not At Risk (09/04/2022)  Alcohol Screen: Low Risk  (05/31/2021)  Depression (PHQ2-9): Low Risk  (05/02/2022)  Financial Resource Strain: Low Risk  (05/31/2021)  Physical Activity: Sufficiently Active (05/31/2021)  Social Connections: Moderately Integrated (05/31/2021)  Stress: No Stress Concern Present (05/31/2021)  Tobacco Use:  Medium Risk (09/05/2022)     Readmission Risk Interventions    08/08/2022   12:16 PM 07/29/2022    4:53 PM  Readmission Risk Prevention Plan  Transportation Screening Complete Complete  HRI or Home Care Consult  Complete  Social Work Consult for Recovery Care Planning/Counseling  Complete  Palliative Care Screening  Not Applicable  Medication Review [facility])  Referral to Pharmacy

## 2022-09-05 NOTE — Progress Notes (Signed)
Attempted to call report to [facility] but no one was available to answer the phone

## 2022-09-05 NOTE — Progress Notes (Signed)
Report given to [place] at [facility]

## 2022-09-05 NOTE — Progress Notes (Addendum)
     Daily Progress Note Intern Pager: [phone]  Patient name: [name] Medical record number: [id number] Date of birth: 1960-12-05 Age: 62 y.o. Gender: female  Primary Care Provider: Lockie Mola, MD Consultants: None Code Status: Full code  Pt Overview and Major Events to Date:  5/1: Admitted to FMTS  Assessment and Plan: [name] is a 62 y.o. female has PMHx ESRD, HTN, T2DM, NSTEMI presenting with N/V.   * Intractable nausea and vomiting No further episodes of vomiting reports. Advance patient diet as tolerated. COVID negative. -Reglan 10mg  QID -Continue home Protonix 40mg  daily -Monitor hydration status closely, hold on fluids given hydrated and h/o ESRD  Hypertension associated with diabetes (HCC) Significant improvement from admission and now having soft Bps. Asymptomatic. She was increased to Metoprolol 25mg  BID but will decrease back to home dose of Metoprolol 12.5mg  BID. -Decrease to Metoprolol 12.5mg  BID  -Restart home Midodrine 5mg  TID  Type 2 diabetes mellitus with diabetic chronic kidney disease (HCC) Well controlled at baseline (A1c 5.4 in 07/2022), consider adding CBG checks as diet advances.  ESRD on dialysis [facility]) Dialysis MWF, reports she has not missed a session. -Will need Nephrology consult if hospitalized on Friday   Chronic stable conditions: HLD: continue Lipitor 40mg  daily  FEN/GI: Heart healthy diet PPx: Heparin Dispo: Home pending clinical improvement  Subjective:  Patient assessed at bedside, states she feels much better. Has not vomited since admission. Has been drinking some fluids and keeping them down. Denies diarrhea or chills. Lives at home with son, will return there upon d/c  Objective: Temp:  [98.2 F (36.8 C)-99.6 F (37.6 C)] 98.2 F (36.8 C) (05/02 0758) Pulse Rate:  [82-111] 82 (05/02 0859) Resp:  [14-34] 18 (05/02 0758) BP: (90-196)/(33-94) 97/49 (05/02 0859) SpO2:  [96 %-100 %] 100 % (05/02 0758) Physical  Exam: General: Sitting up in bed, alert, NAD Cardiovascular: RRR without murmur Respiratory: CTAB, normal WOB on RA Abdomen: Diffusely tender to mild palpation. Soft. Non-distended Extremities: No peripheral edema  Laboratory: Most recent CBC Lab Results  Component Value Date   WBC 6.9 09/05/2022   HGB 9.3 (L) 09/05/2022   HCT 28.9 (L) 09/05/2022   MCV 88.9 09/05/2022   PLT 254 09/05/2022   Most recent BMP    Latest Ref Rng & Units 09/05/2022    8:00 AM  BMP  Glucose 70 - 99 mg/dL 74   BUN 8 - 23 mg/dL 13   Creatinine 1.47 - 1.00 mg/dL 8.29   Sodium 562 - 130 mmol/L 137   Potassium 3.5 - 5.1 mmol/L 3.8   Chloride 98 - 111 mmol/L 92   CO2 22 - 32 mmol/L 31   Calcium 8.9 - 10.3 mg/dL 9.6     Other pertinent labs: Lactate: 2.4>1.6 COVID: neg  Imaging/Diagnostic Tests: CT ABDOMEN PELVIS W CONTRAST Result Date: 09/04/2022 IMPRESSION: No bowel obstruction, free air or free fluid. Diffuse colonic stool. Normal appendix. Nonspecific presacral fat stranding. Of note there is significant stool in the rectum. Atrophic kidneys consistent with known history of chronic renal disease. 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 Prior cholecystectomy.  Small hiatal hernia.    DG Chest Portable 1 View Result Date: 09/04/2022 IMPRESSION: No active disease. Mild cardiomegaly. Aortic atherosclerotic calcification.  Elberta Fortis, MD 09/05/2022, 12:07 PM  PGY-1, [facility] FPTS Intern pager: [phone], text pages welcome Secure chat group [facility] Teaching Service

## 2022-09-05 NOTE — Care Management Obs Status (Cosign Needed)
MEDICARE OBSERVATION STATUS NOTIFICATION   Patient Details  Name: [name] MRN: [id number] Date of Birth: 1960/05/07   Medicare Observation Status Notification Given:  Yes    Janae Bridgeman, RN 09/05/2022, 4:38 PM

## 2022-09-05 NOTE — Discharge Summary (Signed)
Family Medicine Teaching [facility] Discharge Summary  Patient name: [name] Medical record number: [id number] Date of birth: 07-Sep-1960 Age: 62 y.o. Gender: female Date of Admission: 09/04/2022  Date of Discharge: 09/05/2022 Admitting Physician: Westley Chandler, MD  Primary Care Provider: Lockie Mola, MD Consultants: None  Indication for Hospitalization: N/V  Discharge Diagnoses/Problem List:  Principal Problem for Admission: N/V Other Problems addressed during stay:  Principal Problem:   Intractable nausea and vomiting Active Problems:   Type 2 diabetes mellitus with diabetic chronic kidney disease (HCC)   Hypertension associated with diabetes (HCC)   ESRD on dialysis (HCC)   Nausea and vomiting  Brief Hospital Course:  [name] is a 62 y.o.female with a history of NSTEMI, HTN, HLD, DM2, and ESRD on MWF dialysis who was admitted to the [facility] Teaching Service at [facility] for . Her hospital course is detailed below:  N/V Present with N/V x 12 hours and unable to tolerate PO. Likely viral illness worsened by existing diabetic gastroparesis. Restarted on home Reglan 10mg  QID. No further episodes of vomiting after discharge and tolerating PO.  Blood pressure - HTN and hypotension Patient initially hypertensive to 190s upon admission. Takes Midodrine 5mg  TID at home which was held. Home Metoprolol was increased to 25mg  BID. BP corrected as symptoms improved and patient became hypotensive. Her Metoprolol was reduced back to home dose of 12.5mg  BID and home Midodrine 5mg  TID was restarted. BP stable at the time of discharge.   Other chronic conditions were medically managed with home medications and formulary alternatives as necessary (HLD)  PCP Follow-up Recommendations: BP monitoring, BP lability while inpatient  Disposition: Stable  Discharge Condition: [facility]  Discharge Exam:  Vitals:   09/05/22 0758 09/05/22 0859  BP: (!) 90/33 (!) 97/49  Pulse: 90 82  Resp:  18   Temp: 98.2 F (36.8 C)   SpO2: 100%    General: Sitting up in bed, alert, NAD Cardiovascular: RRR without murmur Respiratory: CTAB, normal WOB on RA Abdomen: Diffusely tender to mild palpation. Soft. Non-distended Extremities: No peripheral edema  Significant Procedures: None  Significant Labs and Imaging:  Recent Labs  Lab 09/04/22 1053 09/05/22 0800  WBC 8.0 6.9  HGB 12.7 9.3*  HCT 39.6 28.9*  PLT 310 254   Recent Labs  Lab 09/04/22 1053 09/04/22 2131 09/05/22 0800  NA 136  --  137  K 3.2*  --  3.8  CL 92*  --  92*  CO2 30  --  31  GLUCOSE 117*  --  74  BUN 6*  --  13  CREATININE 2.86*  --  4.72*  CALCIUM 9.3  --  9.6  MG  --  2.0  --   ALKPHOS 78  --   --   AST 26  --   --   ALT 32  --   --   ALBUMIN 3.4*  --   --     Pertinent Imaging: CT ABDOMEN PELVIS W CONTRAST Result Date: 09/04/2022 IMPRESSION: No bowel obstruction, free air or free fluid. Diffuse colonic stool. Normal appendix. Nonspecific presacral fat stranding. Of note there is significant stool in the rectum. Atrophic kidneys consistent with known history of chronic renal disease. 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 Prior cholecystectomy.  Small hiatal hernia.   DG Chest Portable 1 View Result Date: 09/04/2022 IMPRESSION: No active disease. Mild cardiomegaly. Aortic atherosclerotic calcification.   Results/Tests Pending at Time of Discharge: None  Discharge Medications:  Allergies as of 09/05/2022       Reactions   Lisinopril Anaphylaxis, Other (See Comments)   angioedema   Venofer  [ferric Oxide] Other (See Comments)    Back Pain   Camellia Swelling, Other (See Comments)   Angioedema    Jardiance [empagliflozin] Swelling, Rash        Medication List     STOP taking these medications    vancomycin 750 MG/150ML Soln Commonly known as: VANCOREADY       TAKE these medications    accu-chek soft touch lancets Use to check sugars three times a day What  changed:  how much to take how to take this when to take this additional instructions   Accu-Chek Softclix Lancets lancets Use as instructed What changed:  how much to take how to take this when to take this   acetaminophen 500 MG tablet Commonly known as: TYLENOL Take 1,000 mg by mouth every 6 (six) hours as needed for moderate pain.   aspirin EC 81 MG tablet Take 1 tablet (81 mg total) by mouth daily. Swallow whole.   atorvastatin 40 MG tablet Commonly known as: LIPITOR Take 1 tablet (40 mg total) by mouth daily.   b complex-vitamin c-folic acid 0.8 MG Tabs tablet Take 1 tablet by mouth at bedtime.   Calcium Acetate 667 MG Tabs Take 667 mg by mouth in the morning, at noon, and at bedtime.   calcium carbonate 500 MG chewable tablet Commonly known as: TUMS - dosed in mg elemental calcium Chew 1-2 tablets (200-400 mg of elemental calcium total) by mouth 3 (three) times daily as needed for indigestion or heartburn (Abdominal pain). What changed:  how much to take reasons to take this   Darbepoetin Alfa 25 MCG/0.42ML Sosy injection Commonly known as: ARANESP Inject 0.42 mLs (25 mcg total) into the skin every Monday at 6 PM.   feeding supplement (NEPRO CARB STEADY) Liqd Take 237 mLs by mouth in the morning and at bedtime.   fluticasone 50 MCG/ACT nasal spray Commonly known as: FLONASE Place 2 sprays into both nostrils daily.   glucose blood test strip Commonly known as: Accu-Chek Aviva Plus Use as instructed What changed:  how much to take how to take this when to take this   isosorbide-hydrALAZINE 20-37.5 MG tablet Commonly known as: BIDIL Take 1 tablet by mouth in the morning and at bedtime.   metoCLOPramide 10 MG tablet Commonly known as: Reglan Take 1 tablet (10 mg total) by mouth 4 (four) times daily -  before meals and at bedtime. Take 1/2 to 1 tablet prn What changed: additional instructions   metoprolol tartrate 25 MG tablet Commonly known as:  LOPRESSOR Take 0.5 tablets (12.5 mg total) by mouth 2 (two) times daily.   midodrine 5 MG tablet Commonly known as: PROAMATINE Take 1 tablet (5 mg total) by mouth 3 (three) times daily with meals.   nitroGLYCERIN 0.4 MG SL tablet Commonly known as: NITROSTAT Place 1 tablet (0.4 mg total) under the tongue every 5 (five) minutes as needed for up to 10 days for chest pain.   ondansetron 4 MG tablet Commonly known as: ZOFRAN Take 2 mg by mouth every 8 (eight) hours as needed for nausea or vomiting.   pantoprazole 40 MG tablet Commonly known as: PROTONIX Take 1 tablet (40 mg total) by mouth daily. What changed: when to take this   polyethylene glycol 17 g packet Commonly known as: MIRALAX / GLYCOLAX Take 17 g  by mouth daily.        Discharge Instructions: Please refer to Patient Instructions section of EMR for full details.  Patient was counseled important signs and symptoms that should prompt return to medical care, changes in medications, dietary instructions, activity restrictions, and follow up appointments.   Follow-Up Appointments: With PCP once released from [facility], Florentina Addison, MD 09/05/2022, 1:29 PM PGY-1, [facility] Family Medicine

## 2022-10-03 ENCOUNTER — Encounter: Payer: Self-pay | Admitting: Internal Medicine

## 2022-10-03 ENCOUNTER — Other Ambulatory Visit: Payer: Self-pay

## 2022-10-03 ENCOUNTER — Ambulatory Visit (INDEPENDENT_AMBULATORY_CARE_PROVIDER_SITE_OTHER): Payer: 59 | Admitting: Internal Medicine

## 2022-10-03 VITALS — BP 123/73 | HR 82 | Temp 97.5°F

## 2022-10-03 DIAGNOSIS — I330 Acute and subacute infective endocarditis: Secondary | ICD-10-CM

## 2022-10-03 NOTE — Progress Notes (Signed)
Patient ID: [name], female   DOB: 1961/01/17, 62 y.o.   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 Has been receiving vancomycin with hd, which she takes m-w-f. Finished iv abtx on 5/3.   During her march hospitalization, she was found down > 10hrs. Had aki, and bacteremia with  positive blood cultures for corynebacterium and a complex mass in the posterior mitral annulus that could be severe caseating MAC or an annular abscess or both.  She has severe mitral annular calcification.  There is only mild mitral regurgitation and no mitral stenosis.  There is also a mass in the right atrium suggesting vegetation.  She appears to have had an embolic strokewith development of dysarthria and left-sided weakness with brain MRI showing multiple scattered infarcts throughout both cerebral hemispheres and pons.  I think she would be a poor operative candidate.  She remains in SNF. Doing pt/ot Microbiology: 3/19 1 bottle: corynbacteremia plus staph capitis 3/19 1 bottle: corynbacteremia plus enterococcus Outpatient Encounter Medications as of 10/03/2022  Medication Sig   Accu-Chek Softclix Lancets lancets Use as instructed (Patient taking differently: 1 each by Other route See admin instructions. Use as instructed)   acetaminophen (TYLENOL) 500 MG tablet Take 1,000 mg by mouth every 6 (six) hours as needed for moderate pain.   aspirin EC 81 MG tablet Take 1 tablet (81 mg total) by mouth daily. Swallow whole.   atorvastatin (LIPITOR) 40 MG tablet Take by mouth.   b complex-vitamin c-folic acid (NEPHRO-VITE) 0.8 MG TABS tablet Take 1 tablet by mouth at bedtime.   Calcium Acetate 667 MG TABS Take 667 mg by mouth in the morning, at noon, and at bedtime.   calcium carbonate (TUMS - DOSED IN MG ELEMENTAL CALCIUM) 500 MG chewable tablet Chew 1-2 tablets (200-400 mg of elemental calcium total)  by mouth 3 (three) times daily as needed for indigestion or heartburn (Abdominal pain). (Patient taking differently: Chew 1,000 mg by mouth 3 (three) times daily as needed for indigestion or heartburn.)   fluticasone (FLONASE) 50 MCG/ACT nasal spray Place 2 sprays into both nostrils daily.   glucose blood (ACCU-CHEK AVIVA PLUS) test strip Use as instructed (Patient taking differently: 1 each by Other route See admin instructions. Use as instructed)   isosorbide-hydrALAZINE (BIDIL) 20-37.5 MG tablet Take 1 tablet by mouth in the morning and at bedtime.   Lancets (ACCU-CHEK SOFT TOUCH) lancets Use to check sugars three times a day (Patient taking differently: 1 each by Other route in the morning, at noon, and at bedtime.)   metoCLOPramide (REGLAN) 10 MG tablet Take 1 tablet (10 mg total) by mouth 4 (four) times daily -  before meals and at bedtime. Take 1/2 to 1 tablet prn (Patient taking differently: Take 10 mg by mouth 4 (four) times daily -  before meals and at bedtime. PRN order: take 10 mg by mouth every day as needed nausea)   metoprolol tartrate (LOPRESSOR) 25 MG tablet Take 0.5 tablets (12.5 mg total) by mouth 2 (two) times daily.   midodrine (PROAMATINE) 5 MG tablet Take 1 tablet (5 mg total) by mouth 3 (three) times daily with meals.   Nutritional Supplements (FEEDING SUPPLEMENT, NEPRO CARB STEADY,) LIQD Take 237 mLs by mouth in the morning and at bedtime.   ondansetron (ZOFRAN) 4 MG tablet Take 2 mg by mouth every 8 (eight) hours as needed for nausea  or vomiting.   pantoprazole (PROTONIX) 40 MG tablet Take 1 tablet (40 mg total) by mouth daily. (Patient taking differently: Take 40 mg by mouth at bedtime.)   polyethylene glycol (MIRALAX / GLYCOLAX) 17 g packet Take 17 g by mouth daily.   atorvastatin (LIPITOR) 40 MG tablet Take 1 tablet (40 mg total) by mouth daily.   Darbepoetin Alfa (ARANESP) 25 MCG/0.42ML SOSY injection Inject 0.42 mLs (25 mcg total) into the skin every Monday at 6 PM.    nitroGLYCERIN (NITROSTAT) 0.4 MG SL tablet Place 1 tablet (0.4 mg total) under the tongue every 5 (five) minutes as needed for up to 10 days for chest pain. (Patient not taking: Reported on 09/05/2022)   No facility-administered encounter medications on file as of 10/03/2022.     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   Diabetic gastroparesis (HCC) 08/09/2015   Nausea and vomiting 01/18/2012   Obstructive sleep apnea of adult 01/16/2012   Osteoarthritis of both knees 01/16/2012   Hyperlipidemia associated with type 2 diabetes mellitus (HCC) 07/18/2011   LEIOMYOMA, UTERUS 01/14/2007   Former smoker 07/03/2006     Health Maintenance Due  Topic Date Due   Zoster Vaccines- Shingrix (1 of 2) Never done   FOOT EXAM  08/20/2016   OPHTHALMOLOGY EXAM  12/10/2019   Colonoscopy  05/13/2021   DTaP/Tdap/Td (2 - Td or Tdap) 05/16/2021   COVID-19 Vaccine (3 - 2023-24 season) 01/04/2022   Medicare Annual Wellness (AWV)  05/31/2022     Review of Systems 12 point ros + deconditioning, denies chest pain or shortness of breath Physical Exam   BP 123/73   Pulse 82   Temp (!) 97.5 F (36.4 C) (Oral)   LMP  (LMP Unknown)   SpO2 (!) 82%   Physical Exam  Constitutional:  oriented to person, place, and time. appears well-developed and well-nourished. No distress.  HENT: /AT, PERRLA, no scleral icterus Mouth/Throat: Oropharynx is clear and moist. No  oropharyngeal exudate.  Cardiovascular: Normal rate, regular rhythm and normal heart sounds. + SEM No murmur heard.  Pulmonary/Chest: Effort normal and breath sounds normal. No respiratory distress.  has no wheezes.  Neck = supple, no nuchal rigidity Abdominal: Soft. Bowel sounds are normal.  exhibits no distension. There is no tenderness.  Lymphadenopathy: no cervical adenopathy. No axillary adenopathy Neurological: alert and oriented to person, place, and time.  Skin: Skin is warm and dry. No rash noted. No erythema.  Psychiatric: a normal mood and affect.  behavior is normal.    CBC Lab Results  Component Value Date   WBC 6.9 09/05/2022   RBC 3.25 (L) 09/05/2022   HGB 9.3 (L) 09/05/2022   HCT 28.9 (L) 09/05/2022   PLT 254 09/05/2022   MCV 88.9 09/05/2022   MCH 28.6 09/05/2022   MCHC 32.2 09/05/2022   RDW 15.5 09/05/2022   LYMPHSABS 1.0 09/04/2022   MONOABS 0.4 09/04/2022   EOSABS 0.0 09/04/2022    BMET Lab  Results  Component Value Date   NA 137 09/05/2022   K 3.8 09/05/2022   CL 92 (L) 09/05/2022   CO2 31 09/05/2022   GLUCOSE 74 09/05/2022   BUN 13 09/05/2022   CREATININE 4.72 (H) 09/05/2022   CALCIUM 9.6 09/05/2022   GFRNONAA 10 (L) 09/05/2022   GFRAA 4 (L) 10/11/2019      Assessment and Plan 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 Plan to Get TTE to see how treatment may have changed findings from previous echo. Arrange for repeat blood cx through HD center  See back in 4-6 wk

## 2022-10-05 ENCOUNTER — Other Ambulatory Visit: Payer: Self-pay

## 2022-10-05 ENCOUNTER — Inpatient Hospital Stay (HOSPITAL_COMMUNITY)
Admission: EM | Admit: 2022-10-05 | Discharge: 2022-10-08 | DRG: 280 | Disposition: A | Discharge status: Skilled Nursing Facility | Payer: 59 | Source: Skilled Nursing Facility | Attending: Family Medicine | Admitting: Family Medicine

## 2022-10-05 ENCOUNTER — Emergency Department (HOSPITAL_COMMUNITY): Payer: 59

## 2022-10-05 DIAGNOSIS — I169 Hypertensive crisis, unspecified: Secondary | ICD-10-CM | POA: Diagnosis present

## 2022-10-05 DIAGNOSIS — K219 Gastro-esophageal reflux disease without esophagitis: Secondary | ICD-10-CM | POA: Diagnosis present

## 2022-10-05 DIAGNOSIS — D72829 Elevated white blood cell count, unspecified: Secondary | ICD-10-CM | POA: Diagnosis present

## 2022-10-05 DIAGNOSIS — Z87891 Personal history of nicotine dependence: Secondary | ICD-10-CM

## 2022-10-05 DIAGNOSIS — E1122 Type 2 diabetes mellitus with diabetic chronic kidney disease: Secondary | ICD-10-CM | POA: Diagnosis present

## 2022-10-05 DIAGNOSIS — E1143 Type 2 diabetes mellitus with diabetic autonomic (poly)neuropathy: Secondary | ICD-10-CM | POA: Diagnosis present

## 2022-10-05 DIAGNOSIS — K3184 Gastroparesis: Secondary | ICD-10-CM

## 2022-10-05 DIAGNOSIS — I160 Hypertensive urgency: Principal | ICD-10-CM | POA: Diagnosis present

## 2022-10-05 DIAGNOSIS — M898X9 Other specified disorders of bone, unspecified site: Secondary | ICD-10-CM | POA: Diagnosis present

## 2022-10-05 DIAGNOSIS — Z8249 Family history of ischemic heart disease and other diseases of the circulatory system: Secondary | ICD-10-CM | POA: Diagnosis not present

## 2022-10-05 DIAGNOSIS — Z888 Allergy status to other drugs, medicaments and biological substances status: Secondary | ICD-10-CM

## 2022-10-05 DIAGNOSIS — L89151 Pressure ulcer of sacral region, stage 1: Secondary | ICD-10-CM | POA: Diagnosis present

## 2022-10-05 DIAGNOSIS — Z1152 Encounter for screening for COVID-19: Secondary | ICD-10-CM | POA: Diagnosis not present

## 2022-10-05 DIAGNOSIS — Z992 Dependence on renal dialysis: Secondary | ICD-10-CM

## 2022-10-05 DIAGNOSIS — I69392 Facial weakness following cerebral infarction: Secondary | ICD-10-CM

## 2022-10-05 DIAGNOSIS — D631 Anemia in chronic kidney disease: Secondary | ICD-10-CM | POA: Diagnosis present

## 2022-10-05 DIAGNOSIS — E785 Hyperlipidemia, unspecified: Secondary | ICD-10-CM | POA: Diagnosis present

## 2022-10-05 DIAGNOSIS — Z79899 Other long term (current) drug therapy: Secondary | ICD-10-CM | POA: Diagnosis not present

## 2022-10-05 DIAGNOSIS — I120 Hypertensive chronic kidney disease with stage 5 chronic kidney disease or end stage renal disease: Secondary | ICD-10-CM | POA: Diagnosis present

## 2022-10-05 DIAGNOSIS — R112 Nausea with vomiting, unspecified: Secondary | ICD-10-CM

## 2022-10-05 DIAGNOSIS — Z7982 Long term (current) use of aspirin: Secondary | ICD-10-CM

## 2022-10-05 DIAGNOSIS — N186 End stage renal disease: Secondary | ICD-10-CM | POA: Diagnosis present

## 2022-10-05 DIAGNOSIS — Z833 Family history of diabetes mellitus: Secondary | ICD-10-CM | POA: Diagnosis not present

## 2022-10-05 DIAGNOSIS — I21A1 Myocardial infarction type 2: Secondary | ICD-10-CM | POA: Diagnosis present

## 2022-10-05 DIAGNOSIS — I38 Endocarditis, valve unspecified: Secondary | ICD-10-CM | POA: Diagnosis not present

## 2022-10-05 DIAGNOSIS — I69354 Hemiplegia and hemiparesis following cerebral infarction affecting left non-dominant side: Secondary | ICD-10-CM

## 2022-10-05 LAB — CBC
HCT: 37.5 % (ref 36.0–46.0)
Hemoglobin: 12 g/dL (ref 12.0–15.0)
MCH: 29.3 pg (ref 26.0–34.0)
MCHC: 32 g/dL (ref 30.0–36.0)
MCV: 91.5 fL (ref 80.0–100.0)
Platelets: 374 10*3/uL (ref 150–400)
RBC: 4.1 MIL/uL (ref 3.87–5.11)
RDW: 15.5 % (ref 11.5–15.5)
WBC: 13.1 10*3/uL — ABNORMAL HIGH (ref 4.0–10.5)
nRBC: 0.2 % (ref 0.0–0.2)

## 2022-10-05 LAB — RESP PANEL BY RT-PCR (RSV, FLU A&B, COVID)  RVPGX2
Influenza A by PCR: NEGATIVE
Influenza B by PCR: NEGATIVE
Resp Syncytial Virus by PCR: NEGATIVE
SARS Coronavirus 2 by RT PCR: NEGATIVE

## 2022-10-05 LAB — URINALYSIS, ROUTINE W REFLEX MICROSCOPIC
Bacteria, UA: NONE SEEN
Bilirubin Urine: NEGATIVE
Glucose, UA: 50 mg/dL — AB
Hgb urine dipstick: NEGATIVE
Ketones, ur: NEGATIVE mg/dL
Leukocytes,Ua: NEGATIVE
Nitrite: NEGATIVE
Protein, ur: 100 mg/dL — AB
Specific Gravity, Urine: 1.006 (ref 1.005–1.030)
pH: 9 — ABNORMAL HIGH (ref 5.0–8.0)

## 2022-10-05 LAB — COMPREHENSIVE METABOLIC PANEL
ALT: 15 U/L (ref 0–44)
AST: 18 U/L (ref 15–41)
Albumin: 3.6 g/dL (ref 3.5–5.0)
Alkaline Phosphatase: 87 U/L (ref 38–126)
Anion gap: 16 — ABNORMAL HIGH (ref 5–15)
BUN: 15 mg/dL (ref 8–23)
CO2: 30 mmol/L (ref 22–32)
Calcium: 10.5 mg/dL — ABNORMAL HIGH (ref 8.9–10.3)
Chloride: 90 mmol/L — ABNORMAL LOW (ref 98–111)
Creatinine, Ser: 5.04 mg/dL — ABNORMAL HIGH (ref 0.44–1.00)
GFR, Estimated: 9 mL/min — ABNORMAL LOW (ref >60)
Glucose, Bld: 126 mg/dL — ABNORMAL HIGH (ref 70–99)
Potassium: 3.7 mmol/L (ref 3.5–5.1)
Sodium: 136 mmol/L (ref 135–145)
Total Bilirubin: 0.7 mg/dL (ref 0.3–1.2)
Total Protein: 7.8 g/dL (ref 6.5–8.1)

## 2022-10-05 LAB — CBC WITH DIFFERENTIAL/PLATELET
Abs Immature Granulocytes: 0.12 10*3/uL — ABNORMAL HIGH (ref 0.00–0.07)
Basophils Absolute: 0 10*3/uL (ref 0.0–0.1)
Basophils Relative: 0 %
Eosinophils Absolute: 0 10*3/uL (ref 0.0–0.5)
Eosinophils Relative: 0 %
HCT: 39.2 % (ref 36.0–46.0)
Hemoglobin: 12.2 g/dL (ref 12.0–15.0)
Immature Granulocytes: 1 %
Lymphocytes Relative: 9 %
Lymphs Abs: 1.1 10*3/uL (ref 0.7–4.0)
MCH: 28.2 pg (ref 26.0–34.0)
MCHC: 31.1 g/dL (ref 30.0–36.0)
MCV: 90.7 fL (ref 80.0–100.0)
Monocytes Absolute: 0.4 10*3/uL (ref 0.1–1.0)
Monocytes Relative: 3 %
Neutro Abs: 10.4 10*3/uL — ABNORMAL HIGH (ref 1.7–7.7)
Neutrophils Relative %: 87 %
Platelets: 349 10*3/uL (ref 150–400)
RBC: 4.32 MIL/uL (ref 3.87–5.11)
RDW: 15.4 % (ref 11.5–15.5)
WBC: 12 10*3/uL — ABNORMAL HIGH (ref 4.0–10.5)
nRBC: 0 % (ref 0.0–0.2)

## 2022-10-05 LAB — PROTIME-INR
INR: 1 (ref 0.8–1.2)
Prothrombin Time: 13.7 seconds (ref 11.4–15.2)

## 2022-10-05 LAB — TROPONIN I (HIGH SENSITIVITY)
Troponin I (High Sensitivity): 194 ng/L (ref <18)
Troponin I (High Sensitivity): 206 ng/L (ref <18)

## 2022-10-05 LAB — GLUCOSE, CAPILLARY
Glucose-Capillary: 142 mg/dL — ABNORMAL HIGH (ref 70–99)
Glucose-Capillary: 151 mg/dL — ABNORMAL HIGH (ref 70–99)

## 2022-10-05 LAB — MRSA NEXT GEN BY PCR, NASAL: MRSA by PCR Next Gen: NOT DETECTED

## 2022-10-05 LAB — LACTIC ACID, PLASMA
Lactic Acid, Venous: 1.1 mmol/L (ref 0.5–1.9)
Lactic Acid, Venous: 0.9 mmol/L (ref 0.5–1.9)

## 2022-10-05 LAB — MAGNESIUM: Magnesium: 2.3 mg/dL (ref 1.7–2.4)

## 2022-10-05 LAB — BRAIN NATRIURETIC PEPTIDE: B Natriuretic Peptide: 2051.8 pg/mL — ABNORMAL HIGH (ref 0.0–100.0)

## 2022-10-05 LAB — APTT: aPTT: 28 seconds (ref 24–36)

## 2022-10-05 MED ORDER — ASPIRIN 81 MG PO CHEW
81.0000 mg | CHEWABLE_TABLET | Freq: Every day | ORAL | Status: DC
  Administered 2022-10-06 – 2022-10-08 (×3): 81 mg via ORAL
  Filled 2022-10-05 (×3): qty 1

## 2022-10-05 MED ORDER — ONDANSETRON HCL 4 MG/2ML IJ SOLN
4.0000 mg | Freq: Once | INTRAMUSCULAR | Status: AC
  Administered 2022-10-05: 4 mg via INTRAVENOUS
  Filled 2022-10-05: qty 2

## 2022-10-05 MED ORDER — NICARDIPINE HCL IN NACL 20-0.86 MG/200ML-% IV SOLN
0.0000 mg/h | INTRAVENOUS | Status: DC
  Administered 2022-10-05 – 2022-10-06 (×2): 5 mg/h via INTRAVENOUS
  Filled 2022-10-05 (×3): qty 200

## 2022-10-05 MED ORDER — METOCLOPRAMIDE HCL 5 MG PO TABS
10.0000 mg | ORAL_TABLET | Freq: Three times a day (TID) | ORAL | Status: DC
  Administered 2022-10-05 – 2022-10-08 (×12): 10 mg via ORAL
  Filled 2022-10-05: qty 1
  Filled 2022-10-05 (×2): qty 2
  Filled 2022-10-05 (×4): qty 1
  Filled 2022-10-05: qty 2
  Filled 2022-10-05 (×2): qty 1
  Filled 2022-10-05: qty 2
  Filled 2022-10-05: qty 1
  Filled 2022-10-05: qty 2
  Filled 2022-10-05 (×2): qty 1

## 2022-10-05 MED ORDER — CHLORHEXIDINE GLUCONATE CLOTH 2 % EX PADS
6.0000 | MEDICATED_PAD | Freq: Every day | CUTANEOUS | Status: DC
  Administered 2022-10-05 – 2022-10-08 (×4): 6 via TOPICAL

## 2022-10-05 MED ORDER — METOPROLOL TARTRATE 12.5 MG HALF TABLET
12.5000 mg | ORAL_TABLET | Freq: Two times a day (BID) | ORAL | Status: DC
  Administered 2022-10-05: 12.5 mg via ORAL
  Filled 2022-10-05 (×2): qty 1

## 2022-10-05 MED ORDER — POLYETHYLENE GLYCOL 3350 17 G PO PACK: 17.0000 g | PACK | Freq: Every day | ORAL | Status: DC | PRN

## 2022-10-05 MED ORDER — ORAL CARE MOUTH RINSE: 15.0000 mL | OROMUCOSAL | Status: DC | PRN

## 2022-10-05 MED ORDER — ONDANSETRON HCL 4 MG/2ML IJ SOLN
4.0000 mg | Freq: Four times a day (QID) | INTRAMUSCULAR | Status: DC | PRN
  Administered 2022-10-06: 4 mg via INTRAVENOUS
  Filled 2022-10-05: qty 2

## 2022-10-05 MED ORDER — PANTOPRAZOLE SODIUM 40 MG PO TBEC
40.0000 mg | DELAYED_RELEASE_TABLET | Freq: Every day | ORAL | Status: DC
  Administered 2022-10-05 – 2022-10-08 (×4): 40 mg via ORAL
  Filled 2022-10-05 (×4): qty 1

## 2022-10-05 MED ORDER — HEPARIN SODIUM (PORCINE) 5000 UNIT/ML IJ SOLN
5000.0000 [IU] | Freq: Three times a day (TID) | INTRAMUSCULAR | Status: DC
  Administered 2022-10-05 – 2022-10-08 (×10): 5000 [IU] via SUBCUTANEOUS
  Filled 2022-10-05 (×10): qty 1

## 2022-10-05 MED ORDER — LACTATED RINGERS IV SOLN: INTRAVENOUS | Status: DC

## 2022-10-05 MED ORDER — ATORVASTATIN CALCIUM 40 MG PO TABS
40.0000 mg | ORAL_TABLET | Freq: Every day | ORAL | Status: DC
  Administered 2022-10-06 – 2022-10-08 (×3): 40 mg via ORAL
  Filled 2022-10-05 (×3): qty 1

## 2022-10-05 MED ORDER — INSULIN ASPART 100 UNIT/ML IJ SOLN
0.0000 [IU] | INTRAMUSCULAR | Status: DC
  Administered 2022-10-05: 1 [IU] via SUBCUTANEOUS

## 2022-10-05 MED ORDER — DOCUSATE SODIUM 100 MG PO CAPS: 100.0000 mg | ORAL_CAPSULE | Freq: Two times a day (BID) | ORAL | Status: DC | PRN

## 2022-10-05 MED ORDER — HYDRALAZINE HCL 20 MG/ML IJ SOLN
10.0000 mg | INTRAMUSCULAR | Status: AC
  Administered 2022-10-05: 10 mg via INTRAVENOUS
  Filled 2022-10-05: qty 1

## 2022-10-05 MED ORDER — SODIUM CHLORIDE 0.9 % IV SOLN
2.0000 g | INTRAVENOUS | Status: DC
  Administered 2022-10-05: 2 g via INTRAVENOUS
  Filled 2022-10-05: qty 20

## 2022-10-05 MED ORDER — ISOSORB DINITRATE-HYDRALAZINE 20-37.5 MG PO TABS
1.0000 | ORAL_TABLET | Freq: Two times a day (BID) | ORAL | Status: DC
  Administered 2022-10-05 – 2022-10-08 (×6): 1 via ORAL
  Filled 2022-10-05 (×8): qty 1

## 2022-10-05 MED ORDER — SODIUM CHLORIDE 0.9 % IV SOLN: INTRAVENOUS | Status: DC

## 2022-10-05 NOTE — Progress Notes (Signed)
Elink following for sepsis protocol. 

## 2022-10-05 NOTE — ED Triage Notes (Signed)
Pt BIB EMS from [facility] with c/o n/v since last night. Per facility, pt has been febrile. Pt is a HD pt and goes MWF. Pt had entire HD session on Friday. Pt is hypertensive for EMS in the 200s. Pt has hx of CVA and MI.

## 2022-10-05 NOTE — H&P (Addendum)
NAME:  [name], MRN:  [id number], DOB:  06-13-1960, LOS: 0 ADMISSION DATE:  10/05/2022, CONSULTATION DATE:  10/05/22 REFERRING MD:  Hyacinth Meeker - EM, CHIEF COMPLAINT:  HTN    History of Present Illness:  62 yo F PMH ESRD MWF HD, prior CVA w residual L sided wknss, DM2, stress cardiomyopathy, recent admission (09/04/22) for intractable n/v and labile BP-- felt likely diabetic gastroparesis, who presented to ED from [facility] nursing facility 6/1 after she was found to be tachycardic, hypertensive and warm to touch at facility. Associated nausea, abd pain which is not constant, and had subsided by arrival to ED. SBPs in ED were 240s, refractory to hydralazine, prompting cardene gtt initiation.   PCCM consulted for admission in this setting    Reportedly rcvd full run of HD Friday 5/31  Started having n/v last night, nausea has persisted. No HA blurry vision chest pain breathing difficulty   In obtaining collateral hx from daughter via phone, nv started after eating acidic foods, which historically has triggered the same in the past, followed by HTN   Pertinent  Medical History  HTN  Caridomyopathy CVA ESRD DM2  Significant Hospital Events: Including procedures, antibiotic start and stop dates in addition to other pertinent events   6/1 ED from [place], HTN tachy, admit to PCCM on cardene   Interim History / Subjective:  Starting cardene gtt   Objective   Blood pressure (!) 226/70, pulse 92, temperature 98.7 F (37.1 C), temperature source Rectal, resp. rate (!) 32, height 5\' 10"  (1.778 m), weight 90.7 kg, SpO2 99 %.       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 Completed abx for MV endocarditis about a month ago. Doubt septic.  P -admit to ICU -cardene gtt for SBP goal 180-200  -do not restart home midodrine -start home metop 12.5 bid, bidil 20-37.5 bid  -check BNP, trop -- EKG with some slight st depression   -reglan 10mg  TID with meals and qHS  -prn zofran -- if not helpful try changing to prn reglan  -check mag  -AM nephro consult  -lipitor, ASA to start tomorrow  -SSI  -dc abx and robust mIVF  -will get TTE (rec in ID outpt note 5/30)  Best Practice (right click and "Reselect all SmartList Selections" daily)   Diet/type: Regular consistency (see orders) DVT prophylaxis: prophylactic heparin  GI prophylaxis: PPI Lines: N/A Foley:  N/A Code Status:  full code Last date of multidisciplinary goals of care discussion [updated pt at bedside and daughter Canvis over the phone 6/1]  Labs   CBC: Recent Labs  Lab 10/05/22 1721  WBC 12.0*  NEUTROABS 10.4*  HGB 12.2  HCT 39.2  MCV 90.7  PLT 349    Basic Metabolic Panel: Recent Labs  Lab 10/05/22 1721  NA 136  K 3.7  CL  90*  CO2 30  GLUCOSE 126*  BUN 15  CREATININE 5.04*  CALCIUM 10.5*   GFR: Estimated Creatinine Clearance: 14 mL/min (A) (by C-G formula based on SCr of 5.04 mg/dL (H)). Recent Labs  Lab 10/05/22 1721  WBC 12.0*  LATICACIDVEN 1.1    Liver Function Tests: Recent Labs  Lab 10/05/22 1721  AST 18  ALT 15  ALKPHOS 87  BILITOT 0.7  PROT 7.8  ALBUMIN 3.6   No results for input(s): "LIPASE", "AMYLASE" in the last 168 hours. No results for input(s): "AMMONIA" in the last 168 hours.  ABG    Component Value Date/Time   PHART 7.379 02/05/2019 0306   PCO2ART 30.0 (L)  02/05/2019 0306   PO2ART 74.0 (L) 02/05/2019 0306   HCO3 17.7 (L) 02/05/2019 0306   TCO2 23 07/23/2022 2252   ACIDBASEDEF 6.0 (H) 02/05/2019 0306   O2SAT 95.0 02/05/2019 0306     Coagulation Profile: Recent Labs  Lab 10/05/22 1721  INR 1.0    Cardiac Enzymes: No results for input(s): "CKTOTAL", "CKMB", "CKMBINDEX", "TROPONINI" in the last 168 hours.  HbA1C: HbA1c, POC (controlled diabetic range)  Date/Time Value Ref Range Status  07/24/2021 01:52 PM 5.4 0.0 - 7.0 % Final  01/30/2021 09:30 AM 5.4 0.0 - 7.0 % Final   Hgb A1c MFr Bld  Date/Time Value Ref Range Status  07/25/2022 01:58 AM 5.4 4.8 - 5.6 % Final    Comment:    (NOTE)         Prediabetes: 5.7 - 6.4         Diabetes: >6.4         Glycemic control for adults with diabetes: <7.0   03/13/2022 03:13 PM 5.2 4.8 - 5.6 % Final    Comment:    (NOTE) Pre diabetes:          5.7%-6.4%  Diabetes:              >6.4%  Glycemic control for   <7.0% adults with diabetes     CBG: No results for input(s): "GLUCAP" in the last 168 hours.  Review of Systems:   Review of Systems  Constitutional:  Positive for malaise/fatigue.  HENT: Negative.    Eyes: Negative.   Respiratory: Negative.    Cardiovascular: Negative.   Gastrointestinal:  Positive for nausea and vomiting. Negative for abdominal pain, blood in stool, constipation, diarrhea and melena.  Genitourinary: Negative.   Musculoskeletal: Negative.   Skin: Negative.   Neurological:  Negative for dizziness, tingling, tremors, sensory change, speech change, focal weakness, loss of consciousness and headaches.  Endo/Heme/Allergies: Negative.   Psychiatric/Behavioral: Negative.       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), Aortic atherosclerosis (HCC), Arthritis of knee, Cerebellar cerebrovascular accident (CVA) without late effect (09/23/2019), Coagulation defect,  unspecified (HCC) (02/19/2019), Diabetes mellitus, Diabetic gastroparesis (HCC), Diverticulosis, ESRD (end stage renal disease) (HCC), Fibroid uterus, GERD (gastroesophageal reflux disease), Hyperlipidemia, Hypertension, Hypertensive emergency (02/05/2019), Hypokalemia, IDA (iron deficiency anemia), Iron deficiency anemia, unspecified (02/24/2019), Nausea, Nausea and vomiting in adult (03/13/2022), Paroxysmal ventricular tachycardia (HCC), Renal disorder, 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   Surgical History:   Past Surgical History:  Procedure Laterality Date   AV FISTULA PLACEMENT Left 02/17/2019   Procedure: ARTERIOVENOUS (AV) FISTULA CREATION LEFT UPPER ARM;  Surgeon: Maeola Harman, MD;  Location: [facility] OR;  Service: Vascular;  Laterality:  Left;   CATARACT EXTRACTION     right eye   CESAREAN SECTION     x2   CHOLECYSTECTOMY     laparoscopic   CORONARY ANGIOGRAPHY N/A 07/25/2022   Procedure: CORONARY ANGIOGRAPHY;  Surgeon: Swaziland, [name], MD;  Location[facility] INVASIVE CV LAB;  Service: Cardiovascular;  Laterality: N/A;   FISTULA SUPERFICIALIZATION Left 04/06/2019   Procedure: FISTULA SUPERFICIALIZATION LEFT BRACHIOCEPHALIC;  Surgeon: Maeola Harman, MD;  Location: [facility] OR;  Service: Vascular;  Laterality: Left;  Transposition left arm brachiocephalic fistula.   IR FLUORO GUIDE CV LINE RIGHT  02/09/2019   IR FLUORO GUIDE CV LINE RIGHT  03/05/2019   IR US GUIDE VASC ACCESS RIGHT  02/09/2019   MULTIPLE TOOTH EXTRACTIONS     REDUCTION MAMMAPLASTY Bilateral    TEE WITHOUT CARDIOVERSION N/A 07/26/2022   Procedure: TRANSESOPHAGEAL ECHOCARDIOGRAM (TEE);  Surgeon: Thurmon Fair, MD;  Location: [facility] ENDOSCOPY;  Service: Cardiovascular;  Laterality: N/A;     Social History:   reports that she quit smoking about 10 years ago. Her smoking use included cigarettes. She has been exposed to tobacco smoke. She has never used  smokeless tobacco. She reports that she does not drink alcohol and does not use drugs.   Family History:  Her family history includes Cancer in her father; Diabetes in her mother; Hypertension in her mother. There is no history of Colon cancer, Stomach cancer, or Esophageal cancer.   Allergies Allergies  Allergen Reactions   Lisinopril Anaphylaxis and Other (See Comments)    angioedema   Venofer  [Ferric Oxide] Other (See Comments)     Back Pain   Camellia Swelling and Other (See Comments)    Angioedema    Jardiance [Empagliflozin] Swelling and Rash     Home Medications  Prior to Admission medications   Medication Sig Start Date End Date Taking? Authorizing Provider  Accu-Chek Softclix Lancets lancets Use as instructed Patient taking differently: 1 each by Other route See admin instructions. Use as instructed 09/29/18   Diallo, Lilia Argue, MD  acetaminophen (TYLENOL) 500 MG tablet Take 1,000 mg by mouth every 6 (six) hours as needed for moderate pain.    [provider]  aspirin EC 81 MG tablet Take 1 tablet (81 mg total) by mouth daily. Swallow whole. 07/31/22   Fayette Pho, MD  atorvastatin (LIPITOR) 40 MG tablet Take 1 tablet (40 mg total) by mouth daily. 07/31/22 09/05/22  Fayette Pho, MD  atorvastatin (LIPITOR) 40 MG tablet Take by mouth. 09/27/22   [provider]  b complex-vitamin c-folic acid (NEPHRO-VITE) 0.8 MG TABS tablet Take 1 tablet by mouth at bedtime.    [provider]  Calcium Acetate 667 MG TABS Take 667 mg by mouth in the morning, at noon, and at bedtime.    [provider]  calcium carbonate (TUMS - DOSED IN MG ELEMENTAL CALCIUM) 500 MG chewable tablet Chew 1-2 tablets (200-400 mg of elemental calcium total) by mouth 3 (three) times daily as needed for indigestion or heartburn (Abdominal pain). Patient taking differently: Chew 1,000 mg by mouth 3 (three) times daily as needed for indigestion or heartburn. 07/31/22   Fayette Pho, MD  Darbepoetin Alfa (ARANESP) 25 MCG/0.42ML SOSY injection Inject 0.42 mLs (25 mcg total) into the skin every Monday at 6 PM. 08/05/22   Fayette Pho, MD  fluticasone [facility]) 50 MCG/ACT nasal spray Place 2 sprays into both nostrils daily. 03/05/22   Lincoln Brigham, MD  glucose blood (ACCU-CHEK AVIVA PLUS) test strip  Use as instructed Patient taking differently: 1 each by Other route See admin instructions. Use as instructed 09/29/18   Diallo, Abdoulaye, MD  isosorbide-hydrALAZINE (BIDIL) 20-37.5 MG tablet Take 1 tablet by mouth in the morning and at bedtime.    [provider]  Lancets (ACCU-CHEK SOFT TOUCH) lancets Use to check sugars three times a day Patient taking differently: 1 each by Other route in the morning, at noon, and at bedtime. 09/24/18   Diallo, Lilia Argue, MD  metoCLOPramide (REGLAN) 10 MG tablet Take 1 tablet (10 mg total) by mouth 4 (four) times daily -  before meals and at bedtime. Take 1/2 to 1 tablet prn Patient taking differently: Take 10 mg by mouth 4 (four) times daily -  before meals and at bedtime. PRN order: take 10 mg by mouth every day as needed nausea 05/28/22   Pyrtle, Carie Caddy, MD  metoprolol tartrate (LOPRESSOR) 25 MG tablet Take 0.5 tablets (12.5 mg total) by mouth 2 (two) times daily. 07/31/22 10/03/22  Fayette Pho, MD  midodrine (PROAMATINE) 5 MG tablet Take 1 tablet (5 mg total) by mouth 3 (three) times daily with meals. 08/08/22   Lockie Mola, MD  nitroGLYCERIN (NITROSTAT) 0.4 MG SL tablet Place 1 tablet (0.4 mg total) under the tongue every 5 (five) minutes as needed for up to 10 days for chest pain. Patient not taking: Reported on 09/05/2022 07/31/22 08/10/22  Fayette Pho, MD  Nutritional Supplements (FEEDING SUPPLEMENT, NEPRO CARB STEADY,) LIQD Take 237 mLs by mouth in the morning and at bedtime.    [provider]  ondansetron (ZOFRAN) 4 MG tablet Take 2 mg by mouth every 8 (eight) hours as needed for nausea or vomiting.    [provider]  pantoprazole (PROTONIX) 40 MG tablet Take 1 tablet (40 mg total) by mouth daily. Patient taking differently: Take 40 mg by mouth at bedtime. 05/28/22   Pyrtle, Carie Caddy, MD  polyethylene glycol (MIRALAX / GLYCOLAX) 17 g packet Take 17 g by mouth daily. 03/16/22   Lance Muss, MD     Critical care time: 31     CRITICAL CARE Performed by: Lanier Clam   Total critical care time: 40 minutes  Critical care time was exclusive of separately billable procedures and treating other patients.  Critical care was necessary to treat or prevent imminent or life-threatening deterioration.  Critical care was time spent personally by me on the following activities: development of treatment plan with patient and/or surrogate as well as nursing, discussions with consultants, evaluation of patient's response to treatment, examination of patient, obtaining history from patient or surrogate, ordering and performing treatments and interventions, ordering and review of laboratory studies, ordering and review of radiographic studies, pulse oximetry and re-evaluation of patient's condition.  Tessie Fass MSN, AGACNP-BC [facility] Pulmonary/Critical Care Medicine Amion for pager 10/05/2022, 8:26 PM

## 2022-10-05 NOTE — ED Notes (Signed)
Daughter [name] [phone] would like an update asap

## 2022-10-05 NOTE — ED Notes (Signed)
ATTEMPTED EKG TWICE PT VERY SWEATY , CLAMMY AND DAMP SKIN FROM SWEAT , NEEDS TO DRY

## 2022-10-05 NOTE — Progress Notes (Addendum)
eLink Physician-Brief Progress Note Patient Name: GARCIA MEDEMA DOB: April 17, 1961 MRN: [id number]   Date of Service  10/05/2022  HPI/Events of Note  62 year old female with end-stage renal disease on dialysis and previous CVA and metabolic syndrome who initially presented with nausea, vomiting, and hypertensive urgency.  Initially found to be hypertensive refractory to medications and was initially started on nicardipine.  Remains tachycardic, oxygenating 98% on room air  Metabolic panel with elevated creatinine consistent with renal failure.  BNP is elevated.  Mild leukocytosis.  Chest radiograph unremarkable.  EKG with sinus tachycardia.  eICU Interventions  Agree with home meds as ordered.  DVT prophylaxis with heparin, GI prophylaxis not indicated.  No immediate intervention necessary   2246 - Trop 194 -> continue to trend, likely Type II in the setting of HTN  Intervention Category Evaluation Type: New Patient Evaluation  Brysin Towery 10/05/2022, 10:19 PM

## 2022-10-05 NOTE — ED Provider Notes (Signed)
EMERGENCY DEPARTMENT AT [facility] Provider Note   CSN: [id number] Arrival date & time: 10/05/22  1639     History  Chief Complaint  Patient presents with   Emesis    [name] is a 62 y.o. female.   Emesis  This patient is a 62 year old female with a prior stroke causing left-sided weakness, she has end-stage renal disease on dialysis Monday Wednesday Friday, currently at [facility] facility.  She had a complete dialysis session yesterday, last night she started to feel nauseated and weak and today was noted to be warm to the touch, tachycardic and very hypertensive.  She was sent to the hospital for further evaluation.  The patient states she is still nauseated, she also has some abdominal discomfort which comes and goes but is not constant and not present at this time.  Paramedics noted the patient to be tachycardic to 120 warm to the touch but no formal IV access was established nor was there a temperature taken prehospital    Home Medications Prior to Admission medications   Medication Sig Start Date End Date Taking? Authorizing Provider  Accu-Chek Softclix Lancets lancets Use as instructed Patient taking differently: 1 each by Other route See admin instructions. Use as instructed 09/29/18   Diallo, Lilia Argue, MD  acetaminophen (TYLENOL) 500 MG tablet Take 1,000 mg by mouth every 6 (six) hours as needed for moderate pain.    [provider]  aspirin EC 81 MG tablet Take 1 tablet (81 mg total) by mouth daily. Swallow whole. 07/31/22   Fayette Pho, MD  atorvastatin (LIPITOR) 40 MG tablet Take 1 tablet (40 mg total) by mouth daily. 07/31/22 09/05/22  Fayette Pho, MD  atorvastatin (LIPITOR) 40 MG tablet Take by mouth. 09/27/22   [provider]  b complex-vitamin c-folic acid (NEPHRO-VITE) 0.8 MG TABS tablet Take 1 tablet by mouth at bedtime.    [provider]  Calcium Acetate 667 MG TABS Take 667 mg by mouth in the  morning, at noon, and at bedtime.    [provider]  calcium carbonate (TUMS - DOSED IN MG ELEMENTAL CALCIUM) 500 MG chewable tablet Chew 1-2 tablets (200-400 mg of elemental calcium total) by mouth 3 (three) times daily as needed for indigestion or heartburn (Abdominal pain). Patient taking differently: Chew 1,000 mg by mouth 3 (three) times daily as needed for indigestion or heartburn. 07/31/22   Fayette Pho, MD  Darbepoetin Alfa (ARANESP) 25 MCG/0.42ML SOSY injection Inject 0.42 mLs (25 mcg total) into the skin every Monday at 6 PM. 08/05/22   Fayette Pho, MD  fluticasone [facility]) 50 MCG/ACT nasal spray Place 2 sprays into both nostrils daily. 03/05/22   Lincoln Brigham, MD  glucose blood (ACCU-CHEK AVIVA PLUS) test strip Use as instructed Patient taking differently: 1 each by Other route See admin instructions. Use as instructed 09/29/18   Diallo, Abdoulaye, MD  isosorbide-hydrALAZINE (BIDIL) 20-37.5 MG tablet Take 1 tablet by mouth in the morning and at bedtime.    [provider]  Lancets (ACCU-CHEK SOFT TOUCH) lancets Use to check sugars three times a day Patient taking differently: 1 each by Other route in the morning, at noon, and at bedtime. 09/24/18   Diallo, Lilia Argue, MD  metoCLOPramide (REGLAN) 10 MG tablet Take 1 tablet (10 mg total) by mouth 4 (four) times daily -  before meals and at bedtime. Take 1/2 to 1 tablet prn Patient taking differently: Take 10 mg by mouth 4 (four) times daily -  before meals and at bedtime. PRN order: take 10 mg by mouth every day as needed nausea 05/28/22   Pyrtle, Carie Caddy, MD  metoprolol tartrate (LOPRESSOR) 25 MG tablet Take 0.5 tablets (12.5 mg total) by mouth 2 (two) times daily. 07/31/22 10/03/22  Fayette Pho, MD  midodrine (PROAMATINE) 5 MG tablet Take 1 tablet (5 mg total) by mouth 3 (three) times daily with meals. 08/08/22   Lockie Mola, MD  nitroGLYCERIN (NITROSTAT) 0.4 MG SL tablet Place 1 tablet (0.4 mg total) under the tongue  every 5 (five) minutes as needed for up to 10 days for chest pain. Patient not taking: Reported on 09/05/2022 07/31/22 08/10/22  Fayette Pho, MD  Nutritional Supplements (FEEDING SUPPLEMENT, NEPRO CARB STEADY,) LIQD Take 237 mLs by mouth in the morning and at bedtime.    [provider]  ondansetron (ZOFRAN) 4 MG tablet Take 2 mg by mouth every 8 (eight) hours as needed for nausea or vomiting.    [provider]  pantoprazole (PROTONIX) 40 MG tablet Take 1 tablet (40 mg total) by mouth daily. Patient taking differently: Take 40 mg by mouth at bedtime. 05/28/22   Pyrtle, Carie Caddy, MD  polyethylene glycol (MIRALAX / GLYCOLAX) 17 g packet Take 17 g by mouth daily. 03/16/22   Lance Muss, MD      Allergies    Lisinopril, Venofer  [ferric oxide], Camellia, and Jardiance [empagliflozin]    Review of Systems   Review of Systems  Gastrointestinal:  Positive for vomiting.  All other systems reviewed and are negative.   Physical Exam Updated Vital Signs BP (!) 226/70   Pulse 92   Temp 98.7 F (37.1 C) (Rectal)   Resp (!) 32   Ht 1.778 m (5\' 10" )   Wt 90.7 kg   LMP  (LMP Unknown)   SpO2 99%   BMI 28.70 kg/m  Physical Exam Vitals and nursing note reviewed.  Constitutional:      General: She is not in acute distress.    Appearance: She is well-developed. She is ill-appearing.  HENT:     Head: Normocephalic and atraumatic.     Nose: Nose normal. No congestion or rhinorrhea.     Mouth/Throat:     Mouth: Mucous membranes are moist.     Pharynx: No oropharyngeal exudate.  Eyes:     General: No scleral icterus.       Right eye: No discharge.        Left eye: No discharge.     Conjunctiva/sclera: Conjunctivae normal.     Pupils: Pupils are equal, round, and reactive to light.  Neck:     Thyroid: No thyromegaly.     Vascular: No JVD.  Cardiovascular:     Rate and Rhythm: Regular rhythm. Tachycardia present.     Heart sounds: Normal heart sounds. No murmur heard.     No friction rub. No gallop.  Pulmonary:     Effort: Pulmonary effort is normal. No respiratory distress.     Breath sounds: Normal breath sounds. No wheezing or rales.  Abdominal:     General: Bowel sounds are normal. There is no distension.     Palpations: Abdomen is soft. There is no mass.     Tenderness: There is no abdominal tenderness.  Genitourinary:    Comments: Perineal and rectal exam, external, small stage I decub, nothing infected or open or draining Musculoskeletal:        General: No tenderness. Normal range of motion.  Cervical back: Normal range of motion and neck supple.     Right lower leg: No edema.     Left lower leg: No edema.  Lymphadenopathy:     Cervical: No cervical adenopathy.  Skin:    General: Skin is warm and dry.     Findings: No erythema or rash.  Neurological:     Mental Status: She is alert.     Comments: Patient at baseline with slight left-sided weakness, slight left-sided facial droop, normal right-sided strength, normal level of alertness and speech  Psychiatric:        Behavior: Behavior normal.     ED Results / Procedures / Treatments   Labs (all labs ordered are listed, but only abnormal results are displayed) Labs Reviewed  COMPREHENSIVE METABOLIC PANEL - Abnormal; Notable for the following components:      Result Value   Chloride 90 (*)    Glucose, Bld 126 (*)    Creatinine, Ser 5.04 (*)    Calcium 10.5 (*)    GFR, Estimated 9 (*)    Anion gap 16 (*)    All other components within normal limits  CBC WITH DIFFERENTIAL/PLATELET - Abnormal; Notable for the following components:   WBC 12.0 (*)    Neutro Abs 10.4 (*)    Abs Immature Granulocytes 0.12 (*)    All other components within normal limits  URINALYSIS, ROUTINE W REFLEX MICROSCOPIC - Abnormal; Notable for the following components:   APPearance HAZY (*)    pH 9.0 (*)    Glucose, UA 50 (*)    Protein, ur 100 (*)    All other components within normal limits  RESP PANEL BY  RT-PCR (RSV, FLU A&B, COVID)  RVPGX2  CULTURE, BLOOD (ROUTINE X 2)  CULTURE, BLOOD (ROUTINE X 2)  URINE CULTURE  LACTIC ACID, PLASMA  PROTIME-INR  APTT  LACTIC ACID, PLASMA    EKG EKG Interpretation  Date/Time:  Saturday October 05 2022 17:59:05 EDT Ventricular Rate:  120 PR Interval:  150 QRS Duration: 100 QT Interval:  336 QTC Calculation: 475 R Axis:   47 Text Interpretation: Sinus tachycardia Minimal ST depression, diffuse leads Confirmed by Eber Hong ([id number]) on 10/05/2022 7:01:40 PM  Radiology DG Chest Port 1 View  Result Date: 10/05/2022 CLINICAL DATA:  Questionable sepsis EXAM: PORTABLE CHEST 1 VIEW COMPARISON:  Chest x-ray 09/04/2022 FINDINGS: The heart is enlarged, unchanged. Lungs are clear. There is no pleural effusion or pneumothorax. There are atherosclerotic calcifications of the aorta. No acute fractures are seen. IMPRESSION: 1. No active disease. 2. Cardiomegaly. Electronically Signed   By: Darliss Cheney M.D.   On: 10/05/2022 17:20    Procedures .Critical Care  Performed by: Eber Hong, MD Authorized by: Eber Hong, MD   Critical care provider statement:    Critical care time (minutes):  45   Critical care time was exclusive of:  Separately billable procedures and treating other patients and teaching time   Critical care was necessary to treat or prevent imminent or life-threatening deterioration of the following conditions:  Renal failure (Hypertensive crisis)   Critical care was time spent personally by me on the following activities:  Development of treatment plan with patient or surrogate, discussions with consultants, evaluation of patient's response to treatment, examination of patient, obtaining history from patient or surrogate, review of old charts, re-evaluation of patient's condition, pulse oximetry, ordering and review of radiographic studies, ordering and review of laboratory studies and ordering and performing treatments and interventions  I  assumed direction of critical care for this patient from another provider in my specialty: no     Care discussed with: admitting provider   Comments:           Medications Ordered in ED Medications  lactated ringers infusion ( Intravenous New Bag/Given 10/05/22 1823)  cefTRIAXone (ROCEPHIN) 2 g in sodium chloride 0.9 % 100 mL IVPB (0 g Intravenous Stopped 10/05/22 1834)  hydrALAZINE (APRESOLINE) injection 10 mg (has no administration in time range)  0.9 %  sodium chloride infusion (has no administration in time range)  nicardipine (CARDENE) 20mg  in 0.86% saline IV infusion (0.1 mg/ml) (has no administration in time range)  ondansetron (ZOFRAN) injection 4 mg (4 mg Intravenous Given 10/05/22 1750)    ED Course/ Medical Decision Making/ A&P                             Medical Decision Making Amount and/or Complexity of Data Reviewed Labs: ordered. Radiology: ordered. ECG/medicine tests: ordered.  Risk Prescription drug management. Decision regarding hospitalization.    This patient presents to the ED for concern of fever vomiting and generalized weakness, this involves an extensive number of treatment options, and is a complaint that carries with it a high risk of complications and morbidity.  The differential diagnosis includes sepsis, UTI, pyelo-, pneumonia, there does not appear to be a skin related infection, she could be bacteremic from dialysis   Co morbidities that complicate the patient evaluation  End-stage renal disease, prior stroke   Additional history obtained:  Additional history obtained from medical record External records from outside source obtained and reviewed including admission, admitted 1 month ago to this hospital, state approximately 24 hours, nausea and vomiting, no fever at that time, also admitted in March, echocardiogram showed 60 to 65% ejection fraction, 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  The patient is unaware if she is still getting antibiotics The patient was not a surgical candidate during that admission for removal of vegetation or valve procedure given the presence of stroke, septic emboli multifocal pneumonia at that time.   Lab Tests:  I Ordered, and personally interpreted labs.  The pertinent results include: Creatinine of 5, not unexpected   Imaging Studies ordered:  I ordered imaging studies including chest x-ray I independently visualized and interpreted imaging which showed no acute findings I agree with the radiologist interpretation   Cardiac Monitoring: / EKG:  The patient was maintained on a cardiac monitor.  I personally viewed and interpreted the cardiac monitored which showed an underlying rhythm of: Sinus tachycardia   Consultations Obtained:  I requested consultation with the family practice physician, they feel uncomfortable with the patient given the severe hypertension which is reasonable, discussed with ICU,  and discussed lab and imaging findings as well as pertinent plan - they recommend: Admission to ICU   Problem List / ED Course / Critical interventions / Medication management  Hypertensive crisis, blood pressure rising now 240 systolic despite hydralazine, I ordered medication including Cardene for hypertensive emergency Reevaluation of the patient after these medicines showed that the patient critically ill but improving I have  reviewed the patients home medicines and have made adjustments as needed   Social Determinants of Health:  History of stroke, dialysis   Test / Admission - Considered:  Admit to ICU         Final Clinical  Impression(s) / ED Diagnoses Final diagnoses:  Hypertensive crisis  Nausea and vomiting, unspecified vomiting type    Rx / DC Orders ED Discharge Orders     None         Eber Hong, MD 10/05/22 1934

## 2022-10-05 NOTE — Progress Notes (Signed)
Date and time results received: 10/05/22 2238   Test: Troponin Critical Value: 194  Name of Provider Notified: Pola Corn MD  Cranford Mon

## 2022-10-06 ENCOUNTER — Inpatient Hospital Stay (HOSPITAL_COMMUNITY): Payer: 59

## 2022-10-06 DIAGNOSIS — I169 Hypertensive crisis, unspecified: Secondary | ICD-10-CM | POA: Diagnosis not present

## 2022-10-06 DIAGNOSIS — I160 Hypertensive urgency: Secondary | ICD-10-CM | POA: Diagnosis not present

## 2022-10-06 DIAGNOSIS — I38 Endocarditis, valve unspecified: Secondary | ICD-10-CM

## 2022-10-06 LAB — ECHOCARDIOGRAM COMPLETE
Area-P 1/2: 3.87 cm2
Height: 69 in
MV VTI: 1.56 cm2
S' Lateral: 3.5 cm
Weight: 3015.89 oz

## 2022-10-06 LAB — BASIC METABOLIC PANEL
Anion gap: 12 (ref 5–15)
BUN: 17 mg/dL (ref 8–23)
CO2: 29 mmol/L (ref 22–32)
Calcium: 10.1 mg/dL (ref 8.9–10.3)
Chloride: 96 mmol/L — ABNORMAL LOW (ref 98–111)
Creatinine, Ser: 5.45 mg/dL — ABNORMAL HIGH (ref 0.44–1.00)
GFR, Estimated: 8 mL/min — ABNORMAL LOW (ref >60)
Glucose, Bld: 130 mg/dL — ABNORMAL HIGH (ref 70–99)
Potassium: 3.8 mmol/L (ref 3.5–5.1)
Sodium: 137 mmol/L (ref 135–145)

## 2022-10-06 LAB — GLUCOSE, CAPILLARY
Glucose-Capillary: 112 mg/dL — ABNORMAL HIGH (ref 70–99)
Glucose-Capillary: 108 mg/dL — ABNORMAL HIGH (ref 70–99)
Glucose-Capillary: 116 mg/dL — ABNORMAL HIGH (ref 70–99)
Glucose-Capillary: 151 mg/dL — ABNORMAL HIGH (ref 70–99)
Glucose-Capillary: 121 mg/dL — ABNORMAL HIGH (ref 70–99)
Glucose-Capillary: 134 mg/dL — ABNORMAL HIGH (ref 70–99)

## 2022-10-06 LAB — CBC
HCT: 37.4 % (ref 36.0–46.0)
Hemoglobin: 11.6 g/dL — ABNORMAL LOW (ref 12.0–15.0)
MCH: 28.1 pg (ref 26.0–34.0)
MCHC: 31 g/dL (ref 30.0–36.0)
MCV: 90.6 fL (ref 80.0–100.0)
Platelets: 363 10*3/uL (ref 150–400)
RBC: 4.13 MIL/uL (ref 3.87–5.11)
RDW: 15.5 % (ref 11.5–15.5)
WBC: 13.7 10*3/uL — ABNORMAL HIGH (ref 4.0–10.5)
nRBC: 0.1 % (ref 0.0–0.2)

## 2022-10-06 LAB — HEPATITIS B SURFACE ANTIGEN: Hepatitis B Surface Ag: NONREACTIVE

## 2022-10-06 LAB — CULTURE, BLOOD (ROUTINE X 2)

## 2022-10-06 LAB — PHOSPHORUS: Phosphorus: 5.4 mg/dL — ABNORMAL HIGH (ref 2.5–4.6)

## 2022-10-06 MED ORDER — CHLORHEXIDINE GLUCONATE CLOTH 2 % EX PADS: 6.0000 | MEDICATED_PAD | Freq: Every day | CUTANEOUS | Status: DC

## 2022-10-06 MED ORDER — METOPROLOL TARTRATE 25 MG PO TABS
25.0000 mg | ORAL_TABLET | Freq: Two times a day (BID) | ORAL | Status: DC
  Administered 2022-10-06 – 2022-10-08 (×5): 25 mg via ORAL
  Filled 2022-10-06 (×5): qty 1

## 2022-10-06 NOTE — Consult Note (Signed)
Renal Service Consult Note [facility]  [name] 10/06/2022 Carolyn Krabbe, MD Requesting Physician: Dr. Judeth Brown  Reason for Consult: ESRD pt admitted for HTN'sive urgency HPI: The patient is a 62 y.o. year-old w/ PMH as below who presented to ED from SNF for nausea / vomiting overnight, along w/ fevers. Pt on HD MWF, had full session on Friday. Hx of DM and gastroparesis. Recently was rx'd for endocarditis. In ED BP's were 200s/ 90s, HR tachy, warm to touch. BP didn't respond to IV meds in ED. CCM was consulted and admitted pt to ICU w/ Cardene gtt, reglan tid and zofran for N/V. Abx were not felt to be needed. F/u echo was ordered to f/u endocarditis (completed IV vanc course on 09/06/22). Today BP's are improved. We are asked to see ESRD.    Pt seen in room. Still nauseous but better than yesterday. No CP, SOB or leg swelling. She was vomiting for about 1-1.5 days at home.   Pt was living alone and driving herself to dialysis. Now is in a SNF and they provide transportation to and from dialysis.   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   Aortic atherosclerosis (HCC)    Arthritis of knee    bilateral   Cerebellar cerebrovascular accident (CVA) without late effect 09/23/2019   Coagulation defect, unspecified (HCC) 02/19/2019   Diabetes mellitus    type 2 - no meds   Diabetic gastroparesis (HCC)    Diverticulosis    ESRD (end stage renal disease) (HCC)    Fibroid uterus    GERD (gastroesophageal reflux disease)    Hyperlipidemia    Hypertension    Hypertensive emergency 02/05/2019   Hypokalemia    IDA (iron deficiency anemia)    Iron deficiency anemia, unspecified 02/24/2019   Nausea    Nausea and vomiting in adult  03/13/2022   Paroxysmal ventricular tachycardia (HCC)    Renal disorder    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)    Umbilical hernia    Wears dentures    Past Surgical History  Past Surgical History:  Procedure Laterality Date   AV FISTULA PLACEMENT Left 02/17/2019   Procedure: ARTERIOVENOUS (AV) FISTULA CREATION LEFT UPPER ARM;  Surgeon: Carolyn Harman, MD;  Location: [facility] OR;  Service: Vascular;  Laterality: Left;   CATARACT EXTRACTION     right eye   CESAREAN SECTION     x2   CHOLECYSTECTOMY     laparoscopic   CORONARY ANGIOGRAPHY N/A 07/25/2022   Procedure: CORONARY ANGIOGRAPHY;  Surgeon: [place], [name], MD;  Location: [facility] INVASIVE CV LAB;  Service: Cardiovascular;  Laterality: N/A;   FISTULA SUPERFICIALIZATION Left 04/06/2019   Procedure: FISTULA SUPERFICIALIZATION LEFT BRACHIOCEPHALIC;  Surgeon: Carolyn Harman, MD;  Location: [facility] OR;  Service: Vascular;  Laterality: Left;  Transposition left arm brachiocephalic fistula.   IR FLUORO GUIDE CV LINE RIGHT  02/09/2019   IR FLUORO GUIDE CV LINE RIGHT  03/05/2019   IR US GUIDE VASC ACCESS RIGHT  02/09/2019   MULTIPLE TOOTH EXTRACTIONS     REDUCTION MAMMAPLASTY Bilateral    TEE WITHOUT CARDIOVERSION N/A 07/26/2022   Procedure: TRANSESOPHAGEAL ECHOCARDIOGRAM (TEE);  Surgeon: Carolyn Fair, MD;  Location: [facility] ENDOSCOPY;  Service:  Cardiovascular;  Laterality: N/A;   Family History  Family History  Problem Relation Age of Onset   Hypertension Mother    Diabetes Mother    Cancer Father        lung   Colon cancer Neg Hx    Stomach cancer Neg Hx    Esophageal cancer Neg Hx    Social History  reports that she quit smoking about 10 years ago. Her smoking use included cigarettes. She has been exposed to tobacco smoke. She has never used smokeless tobacco. She reports that she does not drink alcohol and does not use drugs. Allergies  Allergies  Allergen Reactions   Lisinopril Anaphylaxis and Other  (See Comments)    angioedema   Venofer  [Ferric Oxide] Other (See Comments)     Back Pain   Camellia Swelling and Other (See Comments)    Angioedema    Jardiance [Empagliflozin] Swelling and Rash   Home medications Prior to Admission medications   Medication Sig Start Date End Date Taking? Authorizing Provider  aspirin EC 81 MG tablet Take 1 tablet (81 mg total) by mouth daily. Swallow whole. 07/31/22  Yes Carolyn Pho, MD  atorvastatin (LIPITOR) 40 MG tablet Take by mouth. 09/27/22  Yes [provider]  b complex-vitamin c-folic acid (NEPHRO-VITE) 0.8 MG TABS tablet Take 1 tablet by mouth at bedtime.   Yes [provider]  Calcium Acetate 667 MG TABS Take 667 mg by mouth in the morning, at noon, and at bedtime.   Yes [provider]  fluticasone (FLONASE) 50 MCG/ACT nasal spray Place 2 sprays into both nostrils daily. 03/05/22  Yes Carolyn Brigham, MD  isosorbide-hydrALAZINE (BIDIL) 20-37.5 MG tablet Take 1 tablet by mouth in the morning and at bedtime.   Yes [provider]  Methoxy PEG-Epoetin Beta (MIRCERA IJ) Inject 1 Syringe into the skin every 14 (fourteen) days. Injection given during dialysis every 2 weeks, last dose given on 09/20/22. 09/06/22 09/19/23 Yes [provider]  metoCLOPramide (REGLAN) 10 MG tablet Take 1 tablet (10 mg total) by mouth 4 (four) times daily -  before meals and at bedtime. Take 1/2 to 1 tablet prn Patient taking differently: Take 10 mg by mouth 4 (four) times daily -  before meals and at bedtime. PRN order: take 10 mg by mouth every day as needed nausea 05/28/22  Yes Pyrtle, Carie Caddy, MD  metoprolol tartrate (LOPRESSOR) 25 MG tablet Take 0.5 tablets (12.5 mg total) by mouth 2 (two) times daily. 07/31/22 10/05/22 Yes Carolyn Pho, MD  Nutritional Supplements (FEEDING SUPPLEMENT, NEPRO CARB STEADY,) LIQD Take 237 mLs by mouth in the morning and at bedtime.   Yes [provider]  ondansetron (ZOFRAN) 4 MG tablet Take 2  mg by mouth every 8 (eight) hours as needed for nausea or vomiting.   Yes [provider]  pantoprazole (PROTONIX) 40 MG tablet Take 1 tablet (40 mg total) by mouth daily. Patient taking differently: Take 40 mg by mouth at bedtime. 05/28/22  Yes Pyrtle, Carie Caddy, MD  polyethylene glycol (MIRALAX / GLYCOLAX) 17 g packet Take 17 g by mouth daily. 03/16/22  Yes Lance Muss, MD  Accu-Chek Softclix Lancets lancets Use as instructed Patient taking differently: 1 each by Other route See admin instructions. Use as instructed 09/29/18   Diallo, Lilia Argue, MD  acetaminophen (TYLENOL) 500 MG tablet Take 1,000 mg by mouth every 6 (six) hours as needed for moderate pain.    [provider]  calcium carbonate (  TUMS - DOSED IN MG ELEMENTAL CALCIUM) 500 MG chewable tablet Chew 1-2 tablets (200-400 mg of elemental calcium total) by mouth 3 (three) times daily as needed for indigestion or heartburn (Abdominal pain). Patient taking differently: Chew 1,000 mg by mouth 3 (three) times daily as needed for indigestion or heartburn. 07/31/22   Carolyn Pho, MD  glucose blood (ACCU-CHEK AVIVA PLUS) test strip Use as instructed Patient taking differently: 1 each by Other route See admin instructions. Use as instructed 09/29/18   Diallo, Lilia Argue, MD  Lancets (ACCU-CHEK SOFT TOUCH) lancets Use to check sugars three times a day Patient taking differently: 1 each by Other route in the morning, at noon, and at bedtime. 09/24/18   Diallo, Lilia Argue, MD  nitroGLYCERIN (NITROSTAT) 0.4 MG SL tablet Place 1 tablet (0.4 mg total) under the tongue every 5 (five) minutes as needed for up to 10 days for chest pain. 07/31/22 08/10/22  Carolyn Pho, MD     Vitals:   10/06/22 1000 10/06/22 1100 10/06/22 1118 10/06/22 1200  BP: (!) 159/79 (!) 152/72  (!) 134/97  Pulse: (!) 101 97  98  Resp: (!) 24 (!) 25  13  Temp:   98.6 F (37 C)   TempSrc:   Oral   SpO2: 96% 96%  99%  Weight:      Height:       Exam Gen  alert, no distress, pleasant adult AAF No rash, cyanosis or gangrene Sclera anicteric, throat clear  No jvd or bruits Chest clear bilat to bases, no rales/ wheezing RRR no MRG Abd soft ntnd no mass or ascites +bs GU defer MS no joint effusions or deformity Ext no LE or UE edema, no wounds or ulcers Neuro is alert, Ox 3 , nf    LUA AVF+bruit    Home meds include - asa, lipitor, phoslo 1 ac tid, bidil 20-37.5 bid, reglan 10 qid or prn, nepro bid prn, protonix, metoprolol 25 bid, prns/ vits/ supps    OP HD: [facility]  4h  400/500  85.4kg  2/2 bath  AVF  Heparin 4000+ 2000 midrun - last HD 5/31, post wt 84.9kg   - mircera 50 mcg IV q 2 wks, last 5/31, due 6/14 - last Hb 10.6    Assessment/ Plan: Hypertensive urgency - prob due to not keeping her BP meds down w/ N/V at home. Sp cardene gtt overnight. BP's better. She is back on her home meds bidil and metoprolol.  N/V/ gastroparesis - getting reglan and IV antiemetics ESRD - on HD MWF. Has not missed HD. HD tomorrow.  Volume - is 1kg up, no edema on exam. UF 2 L w/ HD tomorrow.  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 Add on phos. Is not on vdra's. Hold phoslo (Ca++ containing) until phos is back.  DM2 - getting insulin here H/o prior CVA H/o infective endocarditis - +corynebacterium, completed IV vanc course on 09/06/22      Vinson Moselle  MD [facility] 10/06/2022, 12:27 PM  Recent Labs  Lab 10/05/22 1721 10/05/22 2144 10/06/22 0053  HGB 12.2 12.0 11.6*  ALBUMIN 3.6  --   --   CALCIUM 10.5*  --  10.1  CREATININE 5.04*  --  5.45*  K 3.7  --  3.8   Inpatient medications:  aspirin  81 mg Oral Daily   atorvastatin  40 mg Oral Daily   Chlorhexidine Gluconate Cloth  6  each Topical Daily   heparin  5,000 Units Subcutaneous Q8H   isosorbide-hydrALAZINE  1 tablet Oral Q12H   metoCLOPramide  10 mg Oral TID AC & HS   metoprolol tartrate  25 mg Oral BID   pantoprazole  40 mg Oral  Daily    niCARDipine 0 mg/hr (10/06/22 0301)   docusate sodium, ondansetron (ZOFRAN) IV, mouth rinse, polyethylene glycol

## 2022-10-06 NOTE — Hospital Course (Signed)
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  Hypertensive crisis Uncontrolled SBP's to the 240s refractory to hydralazine prompting Cardene drip initiation in the ED. Admitted initially to ICU. Suspect the sequence of events was nausea/vomiting from gastroparesis and recent antibiotic use for endocarditis which led to missed antihypertensives presenting into hypertensive crisis.  Admitted to ICU for this and was weaned off of cardene on 10/06/2022. Metoprolol increased to 25mg  BID. Transferred back to [facility]MTS on 10/08/2022. At discharge BP was well controlled with Metoprolol and home Bidil dose.   Other chronic conditions were medically managed with home medications and formulary alternatives as necessary (T2DM, hx of CVA, gastroparesis)  PCP Follow-up Recommendations: Switch Zofran to Compazine for better reduction in QT prolongation risk.  Metoprolol increased to 25mg  BID. Patient may require G/J tube placement in future, if emesis persists inhibiting BP control.

## 2022-10-06 NOTE — H&P (Signed)
NAME:  [name], MRN:  [id number], DOB:  1960-07-09, LOS: 1 ADMISSION DATE:  10/05/2022, CONSULTATION DATE:  10/05/22 REFERRING MD:  Hyacinth Meeker - EM, CHIEF COMPLAINT:  HTN    History of Present Illness:  62 yo F PMH ESRD MWF HD, prior CVA w residual L sided wknss, DM2, stress cardiomyopathy, recent admission (09/04/22) for intractable n/v and labile BP-- felt likely diabetic gastroparesis, who presented to ED from [facility] nursing facility 6/1 after she was found to be tachycardic, hypertensive and warm to touch at facility. Associated nausea, abd pain which is not constant, and had subsided by arrival to ED. SBPs in ED were 240s, refractory to hydralazine, prompting cardene gtt initiation.   PCCM consulted for admission in this setting    Reportedly rcvd full run of HD Friday 5/31  Started having n/v last night, nausea has persisted. No HA blurry vision chest pain breathing difficulty   In obtaining collateral hx from daughter via phone, nv started after eating acidic foods, which historically has triggered the same in the past, followed by HTN   Pertinent  Medical History  HTN  Caridomyopathy CVA ESRD DM2  Significant Hospital Events: Including procedures, antibiotic start and stop dates in addition to other pertinent events   6/1 ED from [place], HTN tachy, admit to PCCM on cardene   Interim History / Subjective:  Starting cardene gtt   Objective   Blood pressure (!) 172/76, pulse (!) 111, temperature 98.6 F (37 C), temperature source Oral, resp. rate (!) 26, height 5\' 9"  (1.753 m), weight 85.5 kg, SpO2 97 %.        Intake/Output Summary (Last 24 hours) at 10/06/2022 1011 Last data filed at 10/06/2022 0900 Gross per 24 hour  Intake 369.85 ml  Output 75 ml  Net 294.85 ml   Filed Weights   10/05/22 1644 10/05/22 2157 10/06/22 0347  Weight: 90.7 kg 85.5 kg 85.5 kg    Examination: General: chronically ill F NAD  HENT: NCAT clear oral secretions  Lungs: CTA on RA   Cardiovascular: tachycardic regular  Abdomen: soft ndnt normoactive  Extremities: no acute joint deformity  Neuro: chronic L sided weakness  GU: defer   Resolved Hospital Problem list     Assessment & Plan:   HTN crisis HTN  ESRD on HD  Suspected gastroparesis  DM2 Prior CVA Hx corynebacterium MV endocarditis (abx completed 5/3)  -started on rocephin in ED for possible sepsis, no wbc fever, ua clean cxr clear. Completed abx for MV endocarditis about a month ago. Doubt septic.  Antibiotics discontinued P -cardene gtt for SBP goal 180-200 wean off -do not restart home midodrine, discontinue from home med list -Resume bidil 20-37.5 bid, increase home metoprolol 12.5 to 25 mg twice daily -Mild elevations in troponin less than recent values, no further trending -reglan 10mg  TID with meals and qHS  -prn zofran  -Nephrology alerted to patient admission -Resume home lipitor, ASA to start tomorrow  -SSI  -will get TTE (rec in ID outpt note 5/30)  Best Practice (right click and "Reselect all SmartList Selections" daily)   Diet/type: Regular consistency (see orders) DVT prophylaxis: prophylactic heparin  GI prophylaxis: PPI Lines: N/A Foley:  N/A Code Status:  full code Last date of multidisciplinary goals of care discussion [updated pt at bedside and daughter Canvis over the phone 6/1]  Labs   CBC: Recent Labs  Lab 10/05/22 1721 10/05/22 2144 10/06/22 0053  WBC 12.0* 13.1* 13.7*  NEUTROABS 10.4*  --   --  HGB 12.2 12.0 11.6*  HCT 39.2 37.5 37.4  MCV 90.7 91.5 90.6  PLT 349 374 363     Basic Metabolic Panel: Recent Labs  Lab 10/05/22 1721 10/05/22 2144 10/06/22 0053  NA 136  --  137  K 3.7  --  3.8  CL 90*  --  96*  CO2 30  --  29  GLUCOSE 126*  --  130*  BUN 15  --  17  CREATININE 5.04*  --  5.45*  CALCIUM 10.5*  --  10.1  MG  --  2.3  --     GFR: Estimated Creatinine Clearance: 12.3 mL/min (A) (by C-G formula based on SCr of 5.45 mg/dL  (H)). Recent Labs  Lab 10/05/22 1721 10/05/22 2144 10/05/22 2254 10/06/22 0053  WBC 12.0* 13.1*  --  13.7*  LATICACIDVEN 1.1  --  0.9  --      Liver Function Tests: Recent Labs  Lab 10/05/22 1721  AST 18  ALT 15  ALKPHOS 87  BILITOT 0.7  PROT 7.8  ALBUMIN 3.6    No results for input(s): "LIPASE", "AMYLASE" in the last 168 hours. No results for input(s): "AMMONIA" in the last 168 hours.  ABG    Component Value Date/Time   PHART 7.379 02/05/2019 0306   PCO2ART 30.0 (L) 02/05/2019 0306   PO2ART 74.0 (L) 02/05/2019 0306   HCO3 17.7 (L) 02/05/2019 0306   TCO2 23 07/23/2022 2252   ACIDBASEDEF 6.0 (H) 02/05/2019 0306   O2SAT 95.0 02/05/2019 0306     Coagulation Profile: Recent Labs  Lab 10/05/22 1721  INR 1.0     Cardiac Enzymes: No results for input(s): "CKTOTAL", "CKMB", "CKMBINDEX", "TROPONINI" in the last 168 hours.  HbA1C: HbA1c, POC (controlled diabetic range)  Date/Time Value Ref Range Status  07/24/2021 01:52 PM 5.4 0.0 - 7.0 % Final  01/30/2021 09:30 AM 5.4 0.0 - 7.0 % Final   Hgb A1c MFr Bld  Date/Time Value Ref Range Status  07/25/2022 01:58 AM 5.4 4.8 - 5.6 % Final    Comment:    (NOTE)         Prediabetes: 5.7 - 6.4         Diabetes: >6.4         Glycemic control for adults with diabetes: <7.0   03/13/2022 03:13 PM 5.2 4.8 - 5.6 % Final    Comment:    (NOTE) Pre diabetes:          5.7%-6.4%  Diabetes:              >6.4%  Glycemic control for   <7.0% adults with diabetes     CBG: Recent Labs  Lab 10/05/22 2209 10/05/22 2341 10/06/22 0343 10/06/22 0724  GLUCAP 142* 151* 112* 108*    Review of Systems:   N/a   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), Aortic atherosclerosis (HCC), Arthritis of knee, Cerebellar cerebrovascular accident (CVA) without late effect (09/23/2019), Coagulation defect, unspecified (HCC) (02/19/2019),  Diabetes mellitus, Diabetic gastroparesis (HCC), Diverticulosis, ESRD (end stage renal disease) (HCC), Fibroid uterus, GERD (gastroesophageal reflux disease), Hyperlipidemia, Hypertension, Hypertensive emergency (02/05/2019), Hypokalemia, IDA (iron deficiency anemia), Iron deficiency anemia, unspecified (02/24/2019), Nausea, Nausea and vomiting in adult (03/13/2022), Paroxysmal ventricular tachycardia (HCC), Renal disorder, 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   Surgical History:   Past Surgical History:  Procedure Laterality Date  AV FISTULA PLACEMENT Left 02/17/2019   Procedure: ARTERIOVENOUS (AV) FISTULA CREATION LEFT UPPER ARM;  Surgeon: Maeola Harman, MD;  Location: [facility] OR;  Service: Vascular;  Laterality: Left;   CATARACT EXTRACTION     right eye   CESAREAN SECTION     x2   CHOLECYSTECTOMY     laparoscopic   CORONARY ANGIOGRAPHY N/A 07/25/2022   Procedure: CORONARY ANGIOGRAPHY;  Surgeon: Swaziland, [name], MD;  Location: [facility] INVASIVE CV LAB;  Service: Cardiovascular;  Laterality: N/A;   FISTULA SUPERFICIALIZATION Left 04/06/2019   Procedure: FISTULA SUPERFICIALIZATION LEFT BRACHIOCEPHALIC;  Surgeon: Maeola Harman, MD;  Location: [facility] OR;  Service: Vascular;  Laterality: Left;  Transposition left arm brachiocephalic fistula.   IR FLUORO GUIDE CV LINE RIGHT  02/09/2019   IR FLUORO GUIDE CV LINE RIGHT  03/05/2019   IR US GUIDE VASC ACCESS RIGHT  02/09/2019   MULTIPLE TOOTH EXTRACTIONS     REDUCTION MAMMAPLASTY Bilateral    TEE WITHOUT CARDIOVERSION N/A 07/26/2022   Procedure: TRANSESOPHAGEAL ECHOCARDIOGRAM (TEE);  Surgeon: Thurmon Fair, MD;  Location: [facility] ENDOSCOPY;  Service: Cardiovascular;  Laterality: N/A;     Social History:   reports that she quit smoking about 10 years ago. Her smoking use included cigarettes. She has been exposed to tobacco smoke. She has never used smokeless tobacco. She reports that she  does not drink alcohol and does not use drugs.   Family History:  Her family history includes Cancer in her father; Diabetes in her mother; Hypertension in her mother. There is no history of Colon cancer, Stomach cancer, or Esophageal cancer.   Allergies Allergies  Allergen Reactions   Lisinopril Anaphylaxis and Other (See Comments)    angioedema   Venofer  [Ferric Oxide] Other (See Comments)     Back Pain   Camellia Swelling and Other (See Comments)    Angioedema    Jardiance [Empagliflozin] Swelling and Rash     Home Medications  Prior to Admission medications   Medication Sig Start Date End Date Taking? Authorizing Provider  Accu-Chek Softclix Lancets lancets Use as instructed Patient taking differently: 1 each by Other route See admin instructions. Use as instructed 09/29/18   Diallo, Lilia Argue, MD  acetaminophen (TYLENOL) 500 MG tablet Take 1,000 mg by mouth every 6 (six) hours as needed for moderate pain.    [provider]  aspirin EC 81 MG tablet Take 1 tablet (81 mg total) by mouth daily. Swallow whole. 07/31/22   Fayette Pho, MD  atorvastatin (LIPITOR) 40 MG tablet Take 1 tablet (40 mg total) by mouth daily. 07/31/22 09/05/22  Fayette Pho, MD  atorvastatin (LIPITOR) 40 MG tablet Take by mouth. 09/27/22   [provider]  b complex-vitamin c-folic acid (NEPHRO-VITE) 0.8 MG TABS tablet Take 1 tablet by mouth at bedtime.    [provider]  Calcium Acetate 667 MG TABS Take 667 mg by mouth in the morning, at noon, and at bedtime.    [provider]  calcium carbonate (TUMS - DOSED IN MG ELEMENTAL CALCIUM) 500 MG chewable tablet Chew 1-2 tablets (200-400 mg of elemental calcium total) by mouth 3 (three) times daily as needed for indigestion or heartburn (Abdominal pain). Patient taking differently: Chew 1,000 mg by mouth 3 (three) times daily as needed for indigestion or heartburn. 07/31/22   Fayette Pho, MD  Darbepoetin Alfa (ARANESP) 25  MCG/0.42ML SOSY injection Inject 0.42 mLs (25 mcg total) into the skin every Monday at 6 PM. 08/05/22   Larita Fife,  Santina Evans, MD  fluticasone [facility]) 50 MCG/ACT nasal spray Place 2 sprays into both nostrils daily. 03/05/22   Lincoln Brigham, MD  glucose blood (ACCU-CHEK AVIVA PLUS) test strip Use as instructed Patient taking differently: 1 each by Other route See admin instructions. Use as instructed 09/29/18   Diallo, Abdoulaye, MD  isosorbide-hydrALAZINE (BIDIL) 20-37.5 MG tablet Take 1 tablet by mouth in the morning and at bedtime.    [provider]  Lancets (ACCU-CHEK SOFT TOUCH) lancets Use to check sugars three times a day Patient taking differently: 1 each by Other route in the morning, at noon, and at bedtime. 09/24/18   Diallo, Lilia Argue, MD  metoCLOPramide (REGLAN) 10 MG tablet Take 1 tablet (10 mg total) by mouth 4 (four) times daily -  before meals and at bedtime. Take 1/2 to 1 tablet prn Patient taking differently: Take 10 mg by mouth 4 (four) times daily -  before meals and at bedtime. PRN order: take 10 mg by mouth every day as needed nausea 05/28/22   Pyrtle, Carie Caddy, MD  metoprolol tartrate (LOPRESSOR) 25 MG tablet Take 0.5 tablets (12.5 mg total) by mouth 2 (two) times daily. 07/31/22 10/03/22  Fayette Pho, MD  midodrine (PROAMATINE) 5 MG tablet Take 1 tablet (5 mg total) by mouth 3 (three) times daily with meals. 08/08/22   Lockie Mola, MD  nitroGLYCERIN (NITROSTAT) 0.4 MG SL tablet Place 1 tablet (0.4 mg total) under the tongue every 5 (five) minutes as needed for up to 10 days for chest pain. Patient not taking: Reported on 09/05/2022 07/31/22 08/10/22  Fayette Pho, MD  Nutritional Supplements (FEEDING SUPPLEMENT, NEPRO CARB STEADY,) LIQD Take 237 mLs by mouth in the morning and at bedtime.    [provider]  ondansetron (ZOFRAN) 4 MG tablet Take 2 mg by mouth every 8 (eight) hours as needed for nausea or vomiting.    [provider]  pantoprazole (PROTONIX) 40  MG tablet Take 1 tablet (40 mg total) by mouth daily. Patient taking differently: Take 40 mg by mouth at bedtime. 05/28/22   Pyrtle, Carie Caddy, MD  polyethylene glycol (MIRALAX / GLYCOLAX) 17 g packet Take 17 g by mouth daily. 03/16/22   Lance Muss, MD     Critical care time:      CRITICAL CARE Performed by: Karren Burly   Total critical care time: 34 minutes  Critical care time was exclusive of separately billable procedures and treating other patients.  Critical care was necessary to treat or prevent imminent or life-threatening deterioration.  Critical care was time spent personally by me on the following activities: development of treatment plan with patient and/or surrogate as well as nursing, discussions with consultants, evaluation of patient's response to treatment, examination of patient, obtaining history from patient or surrogate, ordering and performing treatments and interventions, ordering and review of laboratory studies, ordering and review of radiographic studies, pulse oximetry and re-evaluation of patient's condition.  Karren Burly, MD [facility] Pulmonary/Critical Care Medicine Amion for pager 10/06/2022, 10:11 AM

## 2022-10-06 NOTE — Progress Notes (Signed)
Echocardiogram 2D Echocardiogram has been performed.  [name] RDCS 10/06/2022, 11:55 AM

## 2022-10-07 DIAGNOSIS — R112 Nausea with vomiting, unspecified: Secondary | ICD-10-CM

## 2022-10-07 DIAGNOSIS — I169 Hypertensive crisis, unspecified: Secondary | ICD-10-CM | POA: Diagnosis not present

## 2022-10-07 LAB — RENAL FUNCTION PANEL
Albumin: 3.1 g/dL — ABNORMAL LOW (ref 3.5–5.0)
Anion gap: 15 (ref 5–15)
BUN: 28 mg/dL — ABNORMAL HIGH (ref 8–23)
CO2: 29 mmol/L (ref 22–32)
Calcium: 9.7 mg/dL (ref 8.9–10.3)
Chloride: 93 mmol/L — ABNORMAL LOW (ref 98–111)
Creatinine, Ser: 7.19 mg/dL — ABNORMAL HIGH (ref 0.44–1.00)
GFR, Estimated: 6 mL/min — ABNORMAL LOW (ref >60)
Glucose, Bld: 125 mg/dL — ABNORMAL HIGH (ref 70–99)
Phosphorus: 5.1 mg/dL — ABNORMAL HIGH (ref 2.5–4.6)
Potassium: 3.8 mmol/L (ref 3.5–5.1)
Sodium: 137 mmol/L (ref 135–145)

## 2022-10-07 LAB — CBC
HCT: 34.2 % — ABNORMAL LOW (ref 36.0–46.0)
Hemoglobin: 10.6 g/dL — ABNORMAL LOW (ref 12.0–15.0)
MCH: 28.3 pg (ref 26.0–34.0)
MCHC: 31 g/dL (ref 30.0–36.0)
MCV: 91.2 fL (ref 80.0–100.0)
Platelets: 328 10*3/uL (ref 150–400)
RBC: 3.75 MIL/uL — ABNORMAL LOW (ref 3.87–5.11)
RDW: 15.8 % — ABNORMAL HIGH (ref 11.5–15.5)
WBC: 10.9 10*3/uL — ABNORMAL HIGH (ref 4.0–10.5)
nRBC: 0.5 % — ABNORMAL HIGH (ref 0.0–0.2)

## 2022-10-07 LAB — GLUCOSE, CAPILLARY: Glucose-Capillary: 76 mg/dL (ref 70–99)

## 2022-10-07 LAB — URINE CULTURE: Culture: NO GROWTH

## 2022-10-07 MED ORDER — HEPARIN SODIUM (PORCINE) 1000 UNIT/ML IJ SOLN
INTRAMUSCULAR | Status: AC
  Filled 2022-10-07: qty 4

## 2022-10-07 MED ORDER — PROCHLORPERAZINE MALEATE 5 MG PO TABS: 5.0000 mg | ORAL_TABLET | Freq: Four times a day (QID) | ORAL | Status: DC | PRN

## 2022-10-07 MED ORDER — PROCHLORPERAZINE EDISYLATE 10 MG/2ML IJ SOLN: 5.0000 mg | Freq: Four times a day (QID) | INTRAMUSCULAR | Status: DC | PRN

## 2022-10-07 MED ORDER — HEPARIN SODIUM (PORCINE) 1000 UNIT/ML DIALYSIS
4000.0000 [IU] | Freq: Once | INTRAMUSCULAR | Status: AC
  Administered 2022-10-07: 4000 [IU] via INTRAVENOUS_CENTRAL

## 2022-10-07 MED ORDER — HEPARIN SODIUM (PORCINE) 1000 UNIT/ML DIALYSIS: 2000.0000 [IU] | INTRAMUSCULAR | Status: DC | PRN

## 2022-10-07 MED ORDER — HEPARIN SODIUM (PORCINE) 1000 UNIT/ML IJ SOLN
INTRAMUSCULAR | Status: AC
  Filled 2022-10-07: qty 2

## 2022-10-07 MED ORDER — FUROSEMIDE 10 MG/ML IJ SOLN
40.0000 mg | Freq: Once | INTRAMUSCULAR | Status: DC
  Filled 2022-10-07: qty 4

## 2022-10-07 NOTE — Progress Notes (Signed)
NAME:  [name], MRN:  [id number], DOB:  [birth year], LOS: 2 ADMISSION DATE:  10/05/2022, CONSULTATION DATE:  10/05/22 REFERRING MD:  Hyacinth Meeker - EM, CHIEF COMPLAINT:  HTN    History of Present Illness:  62 yo F PMH ESRD MWF HD, prior CVA w residual L sided wknss, DM2, stress cardiomyopathy, recent admission (09/04/22) for intractable n/v and labile BP-- felt likely diabetic gastroparesis, who presented to ED from [facility] nursing facility 6/1 after she was found to be tachycardic, hypertensive and warm to touch at facility. Associated nausea, abd pain which is not constant, and had subsided by arrival to ED. SBPs in ED were 240s, refractory to hydralazine, prompting cardene gtt initiation.   PCCM consulted for admission in this setting    Reportedly rcvd full run of HD Friday 5/31  Started having n/v last night, nausea has persisted. No HA blurry vision chest pain breathing difficulty   In obtaining collateral hx from daughter via phone, nv started after eating acidic foods, which historically has triggered the same in the past, followed by HTN   Pertinent  Medical History  HTN  Caridomyopathy CVA ESRD DM2  Significant Hospital Events: Including procedures, antibiotic start and stop dates in addition to other pertinent events   6/1 ED from [place], HTN tachy, admit to PCCM on cardene  6/2 off cardene in evening  Interim History / Subjective:  Off cardene. Tolerated meds this am. Tolerated oatmeal for breakfast. Due for HD today.   Objective   Blood pressure 118/73, pulse 95, temperature 98.8 F (37.1 C), temperature source Oral, resp. rate 19, height 5\' 9"  (1.753 m), weight 88.2 kg, SpO2 100 %.        Intake/Output Summary (Last 24 hours) at 10/07/2022 0926 Last data filed at 10/07/2022 0900 Gross per 24 hour  Intake 230 ml  Output --  Net 230 ml   Filed Weights   10/05/22 2157 10/06/22 0347 10/07/22 0500  Weight: 85.5 kg 85.5 kg 88.2 kg    Examination: Gen:     Elderly,  no distress, sitting up in chair HEENT:  MMM Lungs:    sounds of mechanical ventilation auscultated no wheeze CV:         tachycardic, regular Abd:      + bowel sounds; soft, non-tender; no palpable masses, no distension Ext:    LUE AVF +thrill and bruit Skin:      Warm and dry; no rashes Neuro:   alert, oriented, follows commands, normal speech, moves all 4 extremities  Labs reivewed EKG reivewed - Qtc prolonged  Na 137 K 3.8 Cr 7.19 Phos 5.1  Resolved Hospital Problem list     Assessment & Plan:   HTN crisis HTN  ESRD on HD  Suspected gastroparesis  DM2 Prior CVA Hx corynebacterium MV endocarditis (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 Suspect sequence of events was nausea/vomiting from gastroparesis and recent abx use for endocarditis.  This led to missed anti-hypertensives and now hypertensive crisis. Now on scheduled reglan and prn zofran. Given prolonged QTC will continue reglan for now and switch to prn compazine.  -continue bidil 20-37.5 bid, metoprolol 25 mg twice daily -Mild elevations in troponin less than recent values, no further trending - HD today, discussed with nephrology -continue home lipitor, ASA  -SSI  -TTE reviewed today shows improved Mitral valve vegetation.  - she is at high risk of bouncing back and repeat admission if gastroparesis does not improve. Would need to  consider likely G-J tube.   Best Practice (right click and "Reselect all SmartList Selections" daily)   Diet/type: Regular consistency (see orders) DVT prophylaxis: prophylactic heparin  GI prophylaxis: PPI Lines: N/A Foley:  N/A Code Status:  full code Last date of multidisciplinary goals of care discussion [updated pt at bedside and daughter [name] over the phone 6/1]  Dispo -  Likely she can be discharged back to [place] after dialysis today.   I spent 35 minutes in total visit time for this patient, with more than 50% spent counseling/coordinating care.   Durel Salts, MD Pulmonary and Critical Care Medicine [facility] 10/07/2022 9:35 AM Pager: see AMION  If no response to pager, please call critical care on call (see AMION) until 7pm After 7:00 pm call Elink

## 2022-10-07 NOTE — Progress Notes (Signed)
Received patient in bed.Alert ,awake and oriented x 3.  Access used : Left upper arm AVF that worked well.  Medicine given Heparin 4,000 units bolus.  Duration of treatment: 3.5 hours.  Fluid removed : Achieved prescribed UF goal of 2 liters.  Hemo comment:Tolerated treatment well.  Hand off to the patient's nurse.

## 2022-10-07 NOTE — Progress Notes (Signed)
Patient transferred via bed with monitor to the hemodialysis unit for Dialysis

## 2022-10-07 NOTE — Progress Notes (Signed)
New Admission Note:    Arrival Method: ICU bed Mental Orientation: a/o x3 Telemetry: box 8 Assessment: completed Skin: stage 2 rectum, deep tissue rt foot IV: 20 G rt ac, rt forearm Pain:n/a Tubes: n/a Safety Measures:  Admission: completed Orientation: Patient has been oriented to the room, unit and staff.  Family: n/a Belongings: n/a  Orders have been reviewed and implemented. Will continue to monitor the patient. Call light has been placed within reach and bed alarm has been activated.   Fabian Sharp BSN, RN-BC Phone number: [phone]

## 2022-10-07 NOTE — Progress Notes (Signed)
Patient ID: LATISIA WRZESINSKI, female   DOB: December 25, 1960, 62 y.o.   MRN: [id number] S: no new complaints.  Off of cardene gtt. O:BP 133/87   Pulse 100   Temp 98.8 F (37.1 C) (Oral)   Resp 17   Ht 5\' 9"  (1.753 m)   Wt 88.2 kg   LMP  (LMP Unknown)   SpO2 100%   BMI 28.71 kg/m   Intake/Output Summary (Last 24 hours) at 10/07/2022 0855 Last data filed at 10/07/2022 0800 Gross per 24 hour  Intake 170 ml  Output 75 ml  Net 95 ml   Intake/Output: I/O last 3 completed shifts: In: 419.9 [P.O.:50; I.V.:269.7; IV Piggyback:100.1] Out: 75 [Urine:75]  Intake/Output this shift:  Total I/O In: 120 [P.O.:120] Out: -  Weight change: -2.519 kg Gen: NAD CVS: RRR  Resp:CTA Abd: +BS, soft, Nt/Nd Ext: no edema, LUE AVF +T/B  Recent Labs  Lab 10/05/22 1721 10/06/22 0053 10/06/22 1301 10/07/22 0038  NA 136 137  --  137  K 3.7 3.8  --  3.8  CL 90* 96*  --  93*  CO2 30 29  --  29  GLUCOSE 126* 130*  --  125*  BUN 15 17  --  28*  CREATININE 5.04* 5.45*  --  7.19*  ALBUMIN 3.6  --   --  3.1*  CALCIUM 10.5* 10.1  --  9.7  PHOS  --   --  5.4* 5.1*  AST 18  --   --   --   ALT 15  --   --   --    Liver Function Tests: Recent Labs  Lab 10/05/22 1721 10/07/22 0038  AST 18  --   ALT 15  --   ALKPHOS 87  --   BILITOT 0.7  --   PROT 7.8  --   ALBUMIN 3.6 3.1*   No results for input(s): "LIPASE", "AMYLASE" in the last 168 hours. No results for input(s): "AMMONIA" in the last 168 hours. CBC: Recent Labs  Lab 10/05/22 1721 10/05/22 2144 10/06/22 0053 10/07/22 0038  WBC 12.0* 13.1* 13.7* 10.9*  NEUTROABS 10.4*  --   --   --   HGB 12.2 12.0 11.6* 10.6*  HCT 39.2 37.5 37.4 34.2*  MCV 90.7 91.5 90.6 91.2  PLT 349 374 363 328   Cardiac Enzymes: No results for input(s): "CKTOTAL", "CKMB", "CKMBINDEX", "TROPONINI" in the last 168 hours. CBG: Recent Labs  Lab 10/06/22 0724 10/06/22 1117 10/06/22 1516 10/06/22 1915 10/06/22 2313  GLUCAP 108* 116* 151* 121* 134*    Iron Studies: No  results for input(s): "IRON", "TIBC", "TRANSFERRIN", "FERRITIN" in the last 72 hours. Studies/Results: ECHOCARDIOGRAM COMPLETE  Result Date: 10/06/2022    ECHOCARDIOGRAM REPORT   Patient Name:   CATTALEYA SA Date of Exam: 10/06/2022 Medical Rec #:  [id number]    Height:       69.0 in Accession #:    [id number]   Weight:       188.5 lb Date of Birth:  1960-08-05    BSA:          2.015 m Patient Age:    61 years     BP:           152/72 mmHg Patient Gender: F            HR:           97 bpm. Exam Location:  Inpatient Procedure: 2D Echo, Color Doppler and Cardiac Doppler Indications:  Endocarditis  History:        Patient has prior history of Echocardiogram examinations, most                 recent 07/26/2022. Risk Factors:Hypertension, Diabetes,                 Dyslipidemia and Sleep Apnea.  Sonographer:    Irving Burton Senior RDCS Referring Phys: [id number] GRACE E BOWSER IMPRESSIONS  1. Left ventricular ejection fraction, by estimation, is 55 to 60%. The left ventricle has normal function. The left ventricle has no regional wall motion abnormalities. There is mild concentric left ventricular hypertrophy. Left ventricular diastolic parameters are consistent with Grade I diastolic dysfunction (impaired relaxation).  2. Right ventricular systolic function is normal. The right ventricular size is normal. There is mildly elevated pulmonary artery systolic pressure.  3. Left atrial size was mildly dilated.  4. The mitral valve is abnormal. Mild mitral valve regurgitation. Moderate mitral stenosis. The mean mitral valve gradient is 8.0 mmHg. Severe mitral annular calcification.  5. The tricuspid valve is abnormal. Tricuspid valve regurgitation is mild to moderate.  6. The aortic valve is tricuspid. Aortic valve regurgitation is not visualized. Aortic valve sclerosis is present, with no evidence of aortic valve stenosis.  7. The inferior vena cava is normal in size with <50% respiratory variability, suggesting right atrial pressure  of 8 mmHg. FINDINGS  Left Ventricle: Left ventricular ejection fraction, by estimation, is 55 to 60%. The left ventricle has normal function. The left ventricle has no regional wall motion abnormalities. The left ventricular internal cavity size was normal in size. There is  mild concentric left ventricular hypertrophy. Left ventricular diastolic parameters are consistent with Grade I diastolic dysfunction (impaired relaxation). Right Ventricle: The right ventricular size is normal. No increase in right ventricular wall thickness. Right ventricular systolic function is normal. There is mildly elevated pulmonary artery systolic pressure. The tricuspid regurgitant velocity is 2.65  m/s, and with an assumed right atrial pressure of 8 mmHg, the estimated right ventricular systolic pressure is 36.1 mmHg. Left Atrium: Left atrial size was mildly dilated. Right Atrium: Right atrial size was normal in size. Pericardium: There is no evidence of pericardial effusion. Mitral Valve: The mitral valve is abnormal. Severe mitral annular calcification. Mild mitral valve regurgitation. Moderate mitral valve stenosis. MV peak gradient, 15.1 mmHg. The mean mitral valve gradient is 8.0 mmHg. Tricuspid Valve: The tricuspid valve is abnormal. Tricuspid valve regurgitation is mild to moderate. Aortic Valve: The aortic valve is tricuspid. Aortic valve regurgitation is not visualized. Aortic valve sclerosis is present, with no evidence of aortic valve stenosis. Pulmonic Valve: The pulmonic valve was normal in structure. Pulmonic valve regurgitation is trivial. Aorta: The aortic root and ascending aorta are structurally normal, with no evidence of dilitation. Venous: The inferior vena cava is normal in size with less than 50% respiratory variability, suggesting right atrial pressure of 8 mmHg. IAS/Shunts: No atrial level shunt detected by color flow Doppler.  LEFT VENTRICLE PLAX 2D LVIDd:         4.80 cm LVIDs:         3.50 cm LV PW:          1.20 cm LV IVS:        1.20 cm LVOT diam:     2.10 cm LV SV:         48 LV SV Index:   24 LVOT Area:     3.46 cm  RIGHT VENTRICLE RV S  prime:     14.10 cm/s TAPSE (M-mode): 2.2 cm LEFT ATRIUM             Index        RIGHT ATRIUM           Index LA diam:        3.60 cm 1.79 cm/m   RA Area:     15.10 cm LA Vol (A2C):   67.1 ml 33.31 ml/m  RA Volume:   41.10 ml  20.40 ml/m LA Vol (A4C):   73.4 ml 36.43 ml/m LA Biplane Vol: 75.3 ml 37.38 ml/m  AORTIC VALVE LVOT Vmax:   95.40 cm/s LVOT Vmean:  77.200 cm/s LVOT VTI:    0.138 m  AORTA Ao Root diam: 3.00 cm Ao Asc diam:  2.80 cm MITRAL VALVE                TRICUSPID VALVE MV Area (PHT): 3.87 cm     TR Peak grad:   28.1 mmHg MV Area VTI:   1.56 cm     TR Vmax:        265.00 cm/s MV Peak grad:  15.1 mmHg MV Mean grad:  8.0 mmHg     SHUNTS MV Vmax:       1.94 m/s     Systemic VTI:  0.14 m MV Vmean:      129.5 cm/s   Systemic Diam: 2.10 cm MV Decel Time: 196 msec MV E velocity: 128.00 cm/s MV A velocity: 174.00 cm/s MV E/A ratio:  0.74 Donato Schultz MD Electronically signed by Donato Schultz MD Signature Date/Time: 10/06/2022/12:50:08 PM    Final    DG Chest Port 1 View  Result Date: 10/05/2022 CLINICAL DATA:  Questionable sepsis EXAM: PORTABLE CHEST 1 VIEW COMPARISON:  Chest x-ray 09/04/2022 FINDINGS: The heart is enlarged, unchanged. Lungs are clear. There is no pleural effusion or pneumothorax. There are atherosclerotic calcifications of the aorta. No acute fractures are seen. IMPRESSION: 1. No active disease. 2. Cardiomegaly. Electronically Signed   By: Darliss Cheney M.D.   On: 10/05/2022 17:20    aspirin  81 mg Oral Daily   atorvastatin  40 mg Oral Daily   Chlorhexidine Gluconate Cloth  6 each Topical Daily   Chlorhexidine Gluconate Cloth  6 each Topical Q0600   heparin  5,000 Units Subcutaneous Q8H   isosorbide-hydrALAZINE  1 tablet Oral Q12H   metoCLOPramide  10 mg Oral TID AC & HS   metoprolol tartrate  25 mg Oral BID   pantoprazole  40 mg Oral Daily     BMET    Component Value Date/Time   NA 137 10/07/2022 0038   NA 145 (H) 11/12/2018 0949   K 3.8 10/07/2022 0038   CL 93 (L) 10/07/2022 0038   CO2 29 10/07/2022 0038   GLUCOSE 125 (H) 10/07/2022 0038   BUN 28 (H) 10/07/2022 0038   BUN 34 (H) 11/12/2018 0949   CREATININE 7.19 (H) 10/07/2022 0038   CREATININE 1.19 (H) 09/28/2015 1524   CALCIUM 9.7 10/07/2022 0038   CALCIUM 9.6 08/05/2022 0835   GFRNONAA 6 (L) 10/07/2022 0038   GFRNONAA 51 (L) 09/28/2015 1524   GFRAA 4 (L) 10/11/2019 1347   GFRAA 59 (L) 09/28/2015 1524   CBC    Component Value Date/Time   WBC 10.9 (H) 10/07/2022 0038   RBC 3.75 (L) 10/07/2022 0038   HGB 10.6 (L) 10/07/2022 0038   HGB 11.4 11/12/2018 0949   HCT 34.2 (L) 10/07/2022  0038   HCT 34.6 11/12/2018 0949   PLT 328 10/07/2022 0038   PLT 404 11/12/2018 0949   MCV 91.2 10/07/2022 0038   MCV 78 (L) 11/12/2018 0949   MCH 28.3 10/07/2022 0038   MCHC 31.0 10/07/2022 0038   RDW 15.8 (H) 10/07/2022 0038   RDW 15.0 11/12/2018 0949   LYMPHSABS 1.1 10/05/2022 1721   MONOABS 0.4 10/05/2022 1721   EOSABS 0.0 10/05/2022 1721   BASOSABS 0.0 10/05/2022 1721    Home meds include - asa, lipitor, phoslo 1 ac tid, bidil 20-37.5 bid, reglan 10 qid or prn, nepro bid prn, protonix, metoprolol 25 bid, prns/ vits/ supps      [facility] HD[facility]  4h  400/500  85.4kg  2/2 bath  AVF  Heparin 4000+ 2000 midrun - last HD 5/31, post wt 84.9kg   - mircera 50 mcg IV q 2 wks, last 5/31, due 6/14 - last Hb 10.6       Assessment/ Plan: Hypertensive urgency - prob due to not keeping her BP meds down w/ N/V at SNF.  Off of cardene gtt. BP's better now that she is back on her home meds bidil and metoprolol.  N/V/ gastroparesis - getting reglan and IV antiemetics ESRD - on HD MWF. Has not missed HD. HD today.  Volume - is 1kg up, no edema on exam. UF 2 L w/ HD tomorrow.  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 Add on  phos. Is not on vdra's. Hold phoslo (Ca++ containing) until phos is back.  DM2 - getting insulin here H/o prior CVA H/o infective endocarditis - +corynebacterium, completed IV vanc course on 09/06/22 Disposition - hopefully can be discharged back to SNF after HD.   Irena Cords, MD [facility]

## 2022-10-07 NOTE — Progress Notes (Signed)
Pt receives out-pt HD at [facility] on MWF. Appears pt admitted from snf per medical record review. Will assist as needed.   Olivia Canter Renal Navigator [phone]

## 2022-10-08 DIAGNOSIS — I169 Hypertensive crisis, unspecified: Secondary | ICD-10-CM | POA: Diagnosis not present

## 2022-10-08 LAB — HEPATITIS B SURFACE ANTIBODY, QUANTITATIVE: Hep B S AB Quant (Post): 88.4 m[IU]/mL (ref >9.9)

## 2022-10-08 LAB — GLUCOSE, CAPILLARY
Glucose-Capillary: 81 mg/dL (ref 70–99)
Glucose-Capillary: 79 mg/dL (ref 70–99)

## 2022-10-08 LAB — CULTURE, BLOOD (ROUTINE X 2)

## 2022-10-08 MED ORDER — METOPROLOL TARTRATE 25 MG PO TABS: 25.0000 mg | ORAL_TABLET | Freq: Two times a day (BID) | ORAL | 0 refills | Status: AC

## 2022-10-08 MED ORDER — PROCHLORPERAZINE MALEATE 5 MG PO TABS: 5.0000 mg | ORAL_TABLET | Freq: Four times a day (QID) | ORAL | 0 refills | Status: AC | PRN

## 2022-10-08 NOTE — Progress Notes (Signed)
Pt to d/c to snf today. Contacted [facility] to advise staff of pt's d/c today and that pt should resume care tomorrow. Spoke to [place] at clinic.   Olivia Canter Renal Navigator [phone]

## 2022-10-08 NOTE — NC FL2 (Signed)
[place] MEDICAID FL2 LEVEL OF CARE FORM     IDENTIFICATION  Patient Name: [name] Birthdate: 24-Feb-1961 Sex: female Admission Date (Current Location): 10/05/2022  [place] and [place] Number:  Haynes Bast [id number] S Facility and Address:  [facility] [place], [address] [place], [place] [address]      Provider Number: [id number]  Attending Physician Name and Address:  Latrelle Dodrill, MD  Relative Name and Phone Number:  Anne Fu (Daughter) [phone]    Current Level of Care: Hospital Recommended Level of Care: Nursing Facility Prior Approval Number:    Date Approved/Denied:   PASRR Number: [id number]  Discharge Plan: SNF    Current Diagnoses: Patient Active Problem List   Diagnosis Date Noted   Hypertensive urgency 10/05/2022   Gastroparesis 10/05/2022   Hypertensive crisis 10/05/2022   Intractable nausea and vomiting 09/04/2022   Acute bacterial endocarditis 07/26/2022   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) 07/24/2022   ESRD on dialysis [facility])    History of stroke 09/18/2019   Type 2 diabetes mellitus with diabetic chronic kidney disease (HCC) 02/19/2019   Anemia in chronic kidney disease 02/19/2019   ESRD (end stage renal disease) (HCC) 02/19/2019   Secondary hyperparathyroidism of renal origin (HCC) 02/19/2019   Venous (peripheral) insufficiency 08/06/2017   Hypertension associated with diabetes (HCC)    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   Diabetic gastroparesis (HCC) 08/09/2015   Nausea and vomiting 01/18/2012   Obstructive sleep apnea of adult 01/16/2012   Osteoarthritis of both knees 01/16/2012   Hyperlipidemia associated with type 2 diabetes mellitus (HCC) 07/18/2011   LEIOMYOMA, UTERUS 01/14/2007   Former smoker 07/03/2006    Orientation RESPIRATION BLADDER Height & Weight     Self  Normal Incontinent Weight: 185 lb 3 oz (84 kg) Height:  5\' 9"  (175.3 cm)  BEHAVIORAL SYMPTOMS/MOOD NEUROLOGICAL  BOWEL NUTRITION STATUS      Incontinent Diet (slee d/c summary)  AMBULATORY STATUS COMMUNICATION OF NEEDS Skin   Total Care Verbally PU Stage and Appropriate Care (R. Ankle- deep tissue; L. Thigh- deep tissue; Buttocks- stage 2)                       Personal Care Assistance Level of Assistance  Bathing, Feeding, Dressing Bathing Assistance: Maximum assistance Feeding assistance: Limited assistance Dressing Assistance: Maximum assistance     Functional Limitations Info  Sight, Hearing, Speech Sight Info: Adequate Hearing Info: Adequate Speech Info: Adequate    SPECIAL CARE FACTORS FREQUENCY  OT (By licensed OT)       OT Frequency: 2x/ week            Contractures Contractures Info: Not present    Additional Factors Info  Code Status, Allergies Code Status Info: Full Allergies Info: Lisinopril  Venofer  (Ferric Oxide)  Camellia  Jardiance (Empagliflozin)           Current Medications (10/08/2022):  This is the current hospital active medication list Current Facility-Administered Medications  Medication Dose Route Frequency Provider Last Rate Last Admin   aspirin chewable tablet 81 mg  81 mg Oral Daily Bowser, Kaylyn Layer, NP   81 mg at 10/08/22 0812   atorvastatin (LIPITOR) tablet 40 mg  40 mg Oral Daily Bowser, Kaylyn Layer, NP   40 mg at 10/08/22 8413   docusate sodium (COLACE) capsule 100 mg  100 mg Oral BID PRN Lanier Clam, NP       heparin injection 5,000  Units  5,000 Units Subcutaneous Q8H Bowser, Kaylyn Layer, NP   5,000 Units at 10/08/22 1343   isosorbide-hydrALAZINE (BIDIL) 20-37.5 MG per tablet 1 tablet  1 tablet Oral Q12H Bowser, Kaylyn Layer, NP   1 tablet at 10/08/22 0812   metoCLOPramide (REGLAN) tablet 10 mg  10 mg Oral TID AC & HS Bowser, Kaylyn Layer, NP   10 mg at 10/08/22 1542   metoprolol tartrate (LOPRESSOR) tablet 25 mg  25 mg Oral BID Hunsucker, Lesia Sago, MD   25 mg at 10/08/22 0812   ondansetron (ZOFRAN) injection 4 mg  4 mg Intravenous Q6H PRN Lanier Clam, NP   4 mg at 10/06/22 2033   Oral care mouth rinse  15 mL Mouth Rinse PRN Olalere, Adewale A, MD       pantoprazole (PROTONIX) EC tablet 40 mg  40 mg Oral Daily Bowser, Kaylyn Layer, NP   40 mg at 10/08/22 1610   polyethylene glycol (MIRALAX / GLYCOLAX) packet 17 g  17 g Oral Daily PRN Bowser, Kaylyn Layer, NP       prochlorperazine (COMPAZINE) injection 5 mg  5 mg Intravenous Q6H PRN Charlott Holler, MD       Or   prochlorperazine (COMPAZINE) tablet 5 mg  5 mg Oral Q6H PRN Charlott Holler, MD         Discharge Medications: Please see discharge summary for a list of discharge medications.  Relevant Imaging Results:  Relevant Lab Results:   Additional Information [id number]  Ralene Bathe, LCSW

## 2022-10-08 NOTE — Care Management Important Message (Signed)
Important Message  Patient Details  Name: [name] MRN: [id number] Date of Birth: 1960/05/22   Medicare Important Message Given:  Yes     Dorena Bodo 10/08/2022, 2:22 PM

## 2022-10-08 NOTE — TOC Transition Note (Signed)
Transition of Care [facility]) - CM/SW Discharge Note   Patient Details  Name: [name] MRN: [id number] Date of Birth: 1960-10-31  Transition of Care [facility]) CM/SW Contact:  Ralene Bathe, LCSW Phone Number: 10/08/2022, 4:04 PM   Clinical Narrative:    Patient will DC to: [facility] Anticipated DC date: 10/08/2022 Family notified: Yes, daughter Transport by: Sharin Mons   Per MD patient ready for DC to SNF. RN to call report prior to discharge [phone] room 102). RN, patient's family, and facility notified of DC. Discharge Summary and FL2 sent to facility.  Ambulance transport requested for patient.   CSW will sign off for now as social work intervention is no longer needed. Please consult [place] again if new needs arise.    Final next level of care: Long Term Nursing Home Barriers to Discharge: No Barriers Identified   Patient Goals and CMS Choice      Discharge Placement                Patient chooses bed at:  [facility]) Patient to be transferred to facility by: [facility] Name of family member notified: Anne Fu (Daughter) [phone] Patient and family notified of of transfer: 10/08/22  Discharge Plan and Services Additional resources added to the After Visit Summary for                                       Social Determinants of Health (SDOH) Interventions SDOH Screenings   Food Insecurity: No Food Insecurity (09/04/2022)  Housing: Low Risk  (09/04/2022)  Transportation Needs: No Transportation Needs (09/04/2022)  Utilities: Not At Risk (09/04/2022)  Alcohol Screen: Low Risk  (05/31/2021)  Depression (PHQ2-9): Low Risk  (10/03/2022)  Financial Resource Strain: Low Risk  (05/31/2021)  Physical Activity: Sufficiently Active (05/31/2021)  Social Connections: Moderately Integrated (05/31/2021)  Stress: No Stress Concern Present (05/31/2021)  Tobacco Use: Medium Risk (10/03/2022)     Readmission Risk Interventions    08/08/2022   12:16 PM 07/29/2022    4:53 PM   Readmission Risk Prevention Plan  Transportation Screening Complete Complete  HRI or Home Care Consult  Complete  Social Work Consult for Recovery Care Planning/Counseling  Complete  Palliative Care Screening  Not Applicable  Medication Review Oceanographer)  Referral to Pharmacy

## 2022-10-08 NOTE — TOC Progression Note (Signed)
Transition of Care [facility]) - Initial/Assessment Note    Patient Details  Name: [name] MRN: [id number] Date of Birth: [birth year]  Transition of Care [facility]) CM/SW Contact:    Ralene Bathe, LCSW Phone Number: 10/08/2022, 8:55 AM  Clinical Narrative:                 LCSW contacted admissions at [facility].  The patient is LTC at the facility and can return to the facility when medically ready.  TOC following.        Patient Goals and CMS Choice            Expected Discharge Plan and Services                                              Prior Living Arrangements/Services                       Activities of Daily Living Home Assistive Devices/Equipment: Splint (specify type), None ADL Screening (condition at time of admission) Patient's cognitive ability adequate to safely complete daily activities?: No Is the patient deaf or have difficulty hearing?: No Does the patient have difficulty seeing, even when wearing glasses/contacts?: No Does the patient have difficulty concentrating, remembering, or making decisions?: No Patient able to express need for assistance with ADLs?: Yes Does the patient have difficulty dressing or bathing?: Yes Independently performs ADLs?: No Communication: Independent Dressing (OT): Needs assistance Is this a change from baseline?: Pre-admission baseline Grooming: Needs assistance Is this a change from baseline?: Pre-admission baseline Feeding: Independent Bathing: Needs assistance Is this a change from baseline?: Pre-admission baseline Toileting: Needs assistance Is this a change from baseline?: Pre-admission baseline In/Out Bed: Needs assistance Is this a change from baseline?: Pre-admission baseline Walks in Home: Dependent Is this a change from baseline?: Pre-admission baseline Does the patient have difficulty walking or climbing stairs?: Yes Weakness of Legs: Both Weakness of Arms/Hands: Both  Permission  Sought/Granted                  Emotional Assessment              Admission diagnosis:  Hypertensive crisis [I16.9] Hypertensive urgency [I16.0] Nausea and vomiting, unspecified vomiting type [R11.2] Patient Active Problem List   Diagnosis Date Noted   Hypertensive urgency 10/05/2022   Gastroparesis 10/05/2022   Hypertensive crisis 10/05/2022   Intractable nausea and vomiting 09/04/2022   Acute bacterial endocarditis 07/26/2022   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) 07/24/2022   ESRD on dialysis [facility])    History of stroke 09/18/2019   Type 2 diabetes mellitus with diabetic chronic kidney disease (HCC) 02/19/2019   Anemia in chronic kidney disease 02/19/2019   ESRD (end stage renal disease) (HCC) 02/19/2019   Secondary hyperparathyroidism of renal origin (HCC) 02/19/2019   Venous (peripheral) insufficiency 08/06/2017   Hypertension associated with diabetes (HCC)    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  [address] [place] ([place]) [place] [address] Phone: [phone] Fax: [phone]     Social Determinants of Health (SDOH) Social History: SDOH Screenings   Food Insecurity: No Food Insecurity (09/04/2022)  Housing: Low Risk  (09/04/2022)  Transportation Needs: No Transportation Needs (09/04/2022)  Utilities: Not At Risk (09/04/2022)  Alcohol Screen: Low Risk  (05/31/2021)  Depression (PHQ2-9): Low Risk  (10/03/2022)  Financial Resource Strain: Low Risk  (05/31/2021)  Physical Activity: Sufficiently Active (05/31/2021)  Social Connections: Moderately  Integrated (05/31/2021)  Stress: No Stress Concern Present (05/31/2021)  Tobacco Use: Medium Risk (10/03/2022)   SDOH Interventions:     Readmission Risk Interventions    08/08/2022   12:16 PM 07/29/2022    4:53 PM  Readmission Risk Prevention Plan  Transportation Screening Complete Complete  HRI or Home Care Consult  Complete  Social Work Consult for Recovery Care Planning/Counseling  Complete  Palliative Care Screening  Not Applicable  Medication Review Oceanographer)  Referral to Pharmacy

## 2022-10-08 NOTE — Evaluation (Signed)
Occupational Therapy Evaluation Patient Details Name: [name] MRN: [id number] DOB: 01/20/1961 Today's Date: 10/08/2022   History of Present Illness 62 y.o. female presents from [facility] tachycardic, hypertensive and warm to tough. Admitted for hypertensive crisis. PMH includes DM, ESRD, GERD, gastroparesis, HLD, HTN, CVA, MI.   Clinical Impression   Pt evaluated s/p above admission list. Pt from [facility] and reports staff assistance for ADLs and use of hoyer for transfers at baseline.  Pt able to self-feed and wears a brief at baseline. Pt presents with generalized weakness,  decreased balance,  decreased activity tolerance and required increased time to follow simple 1-step commands. Pt currently requires mod A for seated UB ADLs and max A +2 for LB ADLs. Pt performed bed mobility with max A +2 and STS transfer from EOB with mod A +2. Pt unable to march in place in standing this session. Pt would benefit from continued acute OT services to maximize functional independence and facilitate transition back to skilled inpatient follow up therapy, <3 hours/day.      Recommendations for follow up therapy are one component of a multi-disciplinary discharge planning process, led by the attending physician.  Recommendations may be updated based on patient status, additional functional criteria and insurance authorization.   Assistance Recommended at Discharge Frequent or constant Supervision/Assistance  Patient can return home with the following Two people to help with walking and/or transfers;Two people to help with bathing/dressing/bathroom;Assistance with cooking/housework;Direct supervision/assist for medications management;Direct supervision/assist for financial management;Assist for transportation;Help with stairs or ramp for entrance    Functional Status Assessment  Patient has had a recent decline in their functional status and demonstrates the ability to make significant  improvements in function in a reasonable and predictable amount of time.  Equipment Recommendations  Other (comment) (defer)    Recommendations for Other Services       Precautions / Restrictions Precautions Precautions: Fall Restrictions Weight Bearing Restrictions: No      Mobility Bed Mobility Overal bed mobility: Needs Assistance Bed Mobility: Rolling, Sidelying to Sit, Sit to Sidelying Rolling: Min assist Sidelying to sit: Max assist, HOB elevated, +2 for physical assistance, +2 for safety/equipment     Sit to sidelying: Max assist, +2 for physical assistance, +2 for safety/equipment General bed mobility comments: Assist for trunk and BLE management    Transfers Overall transfer level: Needs assistance Equipment used: 2 person hand held assist Transfers: Sit to/from Stand Sit to Stand: Mod assist, +2 physical assistance, +2 safety/equipment           General transfer comment: STS from EOB with mod A +2 with BUE support on therapists for stability      Balance Overall balance assessment: Needs assistance Sitting-balance support: Bilateral upper extremity supported, Feet supported Sitting balance-Leahy Scale: Poor Sitting balance - Comments: sitting EOB, min A provided for stability   Standing balance support: Bilateral upper extremity supported Standing balance-Leahy Scale: Poor Standing balance comment: reliant on BUE support on therapists for stability                           ADL either performed or assessed with clinical judgement   ADL Overall ADL's : Needs assistance/impaired Eating/Feeding: Set up;Sitting Eating/Feeding Details (indicate cue type and reason): supported sitting Grooming: Set up;Sitting Grooming Details (indicate cue type and reason): supported sitting Upper Body Bathing: Moderate assistance;Sitting   Lower Body Bathing: Maximal assistance;+2 for physical assistance;+2 for safety/equipment;Sit to/from stand  Upper  Body Dressing : Moderate assistance;Sitting   Lower Body Dressing: Maximal assistance;+2 for physical assistance;+2 for safety/equipment;Sit to/from stand   Toilet Transfer: +2 for physical assistance;+2 for safety/equipment;BSC/3in1;Stand-pivot;Moderate assistance Toilet Transfer Details (indicate cue type and reason): simulated Toileting- Clothing Manipulation and Hygiene: Total assistance       Functional mobility during ADLs: Maximal assistance;+2 for physical assistance;+2 for safety/equipment General ADL Comments: limited by decreased strength, sitting/standing balance and tolerance. Mod A +2 for STS transfers from EOB with BUE support     Vision Baseline Vision/History: 0 No visual deficits Ability to See in Adequate Light: 0 Adequate Vision Assessment?: No apparent visual deficits     Perception Perception Perception Tested?: No   Praxis Praxis Praxis tested?: Not tested    Pertinent Vitals/Pain Pain Assessment Pain Assessment: No/denies pain     Hand Dominance Right   Extremity/Trunk Assessment Upper Extremity Assessment Upper Extremity Assessment: Generalized weakness   Lower Extremity Assessment Lower Extremity Assessment: Defer to PT evaluation   Cervical / Trunk Assessment Cervical / Trunk Assessment: Kyphotic   Communication Communication Communication: No difficulties   Cognition Arousal/Alertness: Awake/alert Behavior During Therapy: WFL for tasks assessed/performed Overall Cognitive Status: No family/caregiver present to determine baseline cognitive functioning                                 General Comments: Pt A+O x4, however required increased time to answer questions and recall recent events. Pt benefits from simple 1-step commands throughout session.     General Comments  VSS on RA    Exercises     Shoulder Instructions      Home Living Family/patient expects to be discharged to:: Skilled nursing facility                                  Additional Comments: [facility]      Prior Functioning/Environment Prior Level of Function : Needs assist             Mobility Comments: Pt reports staff use hoyer lift for transfers, unable to state last time she stood. ADLs Comments: Pt requires assist from staff for dressing and sponge bathing, wears a brief. Pt feeds herself.        OT Problem List: Decreased strength;Decreased range of motion;Decreased activity tolerance;Impaired balance (sitting and/or standing);Decreased cognition;Decreased safety awareness;Decreased knowledge of use of DME or AE;Decreased knowledge of precautions      OT Treatment/Interventions: Self-care/ADL training;Energy conservation;DME and/or AE instruction;Therapeutic activities;Cognitive remediation/compensation;Patient/family education;Balance training    OT Goals(Current goals can be found in the care plan section) Acute Rehab OT Goals Patient Stated Goal: to lay down OT Goal Formulation: With patient Time For Goal Achievement: 10/22/22 Potential to Achieve Goals: Fair ADL Goals Pt Will Perform Upper Body Dressing: sitting;with min guard assist Pt Will Perform Lower Body Dressing: with min assist;sit to/from stand Pt Will Transfer to Toilet: with min assist;bedside commode;stand pivot transfer Additional ADL Goal #1: Pt will complete bed mobility with min A as a precursor to ADLs  OT Frequency: Min 2X/week    Co-evaluation              AM-PAC OT "6 Clicks" Daily Activity     Outcome Measure Help from another person eating meals?: A Little Help from another person taking care of personal grooming?: A Little Help from another person toileting,  which includes using toliet, bedpan, or urinal?: A Lot Help from another person bathing (including washing, rinsing, drying)?: A Lot Help from another person to put on and taking off regular upper body clothing?: A Lot Help from another person to put on and taking  off regular lower body clothing?: A Lot 6 Click Score: 14   End of Session Equipment Utilized During Treatment: Gait belt Nurse Communication: Mobility status  Activity Tolerance: Patient tolerated treatment well Patient left: in bed;with call bell/phone within reach;with bed alarm set  OT Visit Diagnosis: Unsteadiness on feet (R26.81);Other abnormalities of gait and mobility (R26.89);Muscle weakness (generalized) (M62.81)                Time: 2956-2130 OT Time Calculation (min): 15 min Charges:  OT General Charges $OT Visit: 1 Visit OT Evaluation $OT Eval Moderate Complexity: 1 Mod  Sherley Bounds, OTS Acute Rehabilitation Services Office [phone] Secure Chat Communication Preferred   Sherley Bounds 10/08/2022, 1:09 PM

## 2022-10-08 NOTE — Discharge Summary (Signed)
Family Medicine Teaching [facility] Discharge Summary  Patient name: [name] Medical record number: [id number] Date of birth: 06/28/1960 Age: 62 y.o. Gender: female Date of Admission: 10/05/2022  Date of Discharge: 10/08/22 Admitting Physician: Tomma Lightning, MD  Primary Care Provider: Lockie Mola, MD Consultants: CCM  Indication for Hospitalization: Hypertensive Urgency  Discharge Diagnoses/Problem List:  Principal Problem for Admission: Hypertensive Urgency Other Problems addressed during stay:  Active Problems:   ESRD (end stage renal disease) (HCC)   Hypertensive urgency   Gastroparesis   Hypertensive crisis    Brief Hospital Course:  [name] is a 62 yo F PMH ESRD MWF HD, prior CVA w residual L sided weakness, DM2, stress cardiomyopathy, 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 Medicine Teaching Service for hypertensive urgency.  Hypertensive crisis Uncontrolled SBP's to the 240s refractory to hydralazine prompting Cardene drip initiation in the ED. Admitted initially to ICU. Suspect the sequence of events was nausea/vomiting from gastroparesis and recent antibiotic use for endocarditis which led to missed antihypertensives presenting into hypertensive crisis.  Admitted to ICU for this and was weaned off of cardene on 10/06/2022. Metoprolol increased to 25mg  BID. Transferred back to [facility]MTS on 10/08/2022. At discharge BP was well controlled with Metoprolol and home Bidil dose.   Other chronic conditions were medically managed with home medications and formulary alternatives as necessary (T2DM, hx of CVA, gastroparesis)  PCP Follow-up Recommendations: Switch Zofran to Compazine for better reduction in QT prolongation risk.  Metoprolol increased to 25mg  BID. Patient may require G/J tube placement in future, if emesis persists inhibiting BP control.   Disposition: [facility]  Discharge Condition: stable   Discharge Exam:  Vitals:   10/08/22 0532 10/08/22 0859  BP: (!) 121/58 (!) 99/56  Pulse: 93 90  Resp: 18 18  Temp: 98.5 F (36.9 C) 98.3 F (36.8 C)  SpO2: 97% 99%   General: NAD, chronically ill appearing Cardiovascular: RRR, no murmurs, no peripheral edema Respiratory: normal WOB on RA, CTAB, no wheezes, ronchi or rales Abdomen: soft, NTTP, no rebound or guarding Extremities: Moving all 4 extremities equally   Significant Procedures: Dialysis  Significant Labs and Imaging:  Recent Labs  Lab 10/07/22 0038  WBC 10.9*  HGB 10.6*  HCT 34.2*  PLT 328   Recent Labs  Lab 10/07/22 0038  NA 137  K 3.8  CL 93*  CO2 29  GLUCOSE 125*  BUN 28*  CREATININE 7.19*  CALCIUM 9.7  PHOS 5.1*  ALBUMIN 3.1*    No pertinent imaging.  Results/Tests Pending at Time of Discharge: None  Discharge Medications:  Allergies as of 10/08/2022       Reactions   Lisinopril Anaphylaxis, Other (See Comments)   angioedema   Venofer  [ferric Oxide] Other (See Comments)    Back Pain   Camellia Swelling, Other (See Comments)   Angioedema    Jardiance [empagliflozin] Swelling, Rash        Medication List     STOP taking these medications    ondansetron 4 MG tablet Commonly known as: ZOFRAN       TAKE these medications    accu-chek soft touch lancets Use to check sugars three times a day What changed:  how much to take how to take this when to take this additional instructions   Accu-Chek Softclix Lancets lancets Use as instructed What changed:  how much to take how to take this when  to take this   acetaminophen 500 MG tablet Commonly known as: TYLENOL Take 1,000 mg by mouth every 6 (six) hours as needed for moderate pain.   aspirin EC 81 MG tablet Take 1 tablet (81 mg total) by mouth daily. Swallow whole.   atorvastatin 40 MG tablet Commonly known as: LIPITOR Take by mouth.   b complex-vitamin c-folic acid 0.8 MG  Tabs tablet Take 1 tablet by mouth at bedtime.   Calcium Acetate 667 MG Tabs Take 667 mg by mouth in the morning, at noon, and at bedtime.   calcium carbonate 500 MG chewable tablet Commonly known as: TUMS - dosed in mg elemental calcium Chew 1-2 tablets (200-400 mg of elemental calcium total) by mouth 3 (three) times daily as needed for indigestion or heartburn (Abdominal pain). What changed:  how much to take reasons to take this   feeding supplement (NEPRO CARB STEADY) Liqd Take 237 mLs by mouth in the morning and at bedtime.   fluticasone 50 MCG/ACT nasal spray Commonly known as: FLONASE Place 2 sprays into both nostrils daily.   glucose blood test strip Commonly known as: Accu-Chek Aviva Plus Use as instructed What changed:  how much to take how to take this when to take this   isosorbide-hydrALAZINE 20-37.5 MG tablet Commonly known as: BIDIL Take 1 tablet by mouth in the morning and at bedtime.   metoCLOPramide 10 MG tablet Commonly known as: Reglan Take 1 tablet (10 mg total) by mouth 4 (four) times daily -  before meals and at bedtime. Take 1/2 to 1 tablet prn What changed: additional instructions   metoprolol tartrate 25 MG tablet Commonly known as: LOPRESSOR Take 1 tablet (25 mg total) by mouth 2 (two) times daily. What changed: how much to take   MIRCERA IJ Inject 1 Syringe into the skin every 14 (fourteen) days. Injection given during dialysis every 2 weeks, last dose given on 09/20/22.   nitroGLYCERIN 0.4 MG SL tablet Commonly known as: NITROSTAT Place 1 tablet (0.4 mg total) under the tongue every 5 (five) minutes as needed for up to 10 days for chest pain.   pantoprazole 40 MG tablet Commonly known as: PROTONIX Take 1 tablet (40 mg total) by mouth daily. What changed: when to take this   polyethylene glycol 17 g packet Commonly known as: MIRALAX / GLYCOLAX Take 17 g by mouth daily.   prochlorperazine 5 MG tablet Commonly known as:  COMPAZINE Take 1 tablet (5 mg total) by mouth every 6 (six) hours as needed for refractory nausea / vomiting.        Discharge Instructions: Please refer to Patient Instructions section of EMR for full details.  Patient was counseled important signs and symptoms that should prompt return to medical care, changes in medications, dietary instructions, activity restrictions, and follow up appointments.   Follow-Up Appointments:  Patient should see PCP once able to leave [facility].  Celine Mans, MD 10/08/2022, 1:40 PM PGY-1, [facility] Family Medicine

## 2022-10-08 NOTE — Discharge Instructions (Signed)
Dear [name],  Thank you for letting us participate in your care. You were hospitalized for severely high blood pressures. This was likely caused by you not being able to take your blood pressure medicine, from the nausea and vomiting caused by gastroparesis. Your blood pressure was brought down gradually and we gave you medicine to help with the gastroparesis.   POST-HOSPITAL & CARE INSTRUCTIONS Continue taking your blood pressure medicine as prescribed Go to your follow up appointments (listed below)   DOCTOR'S APPOINTMENT   Future Appointments  Date Time Provider Department Center  10/31/2022  9:30 AM Micki Riley, MD [facility] None  11/05/2022  9:30 AM Corky Crafts, MD [facility]  11/12/2022  3:30 PM Judyann Munson, MD [facility]     Take care and be well!  Family Medicine Teaching Service Inpatient Team [place]  [facility]  [address] [place], [place] [address] [phone]

## 2022-10-08 NOTE — Progress Notes (Signed)
[facility] Progress Note   Subjective: Seen in room. No C/Os. Wants to go home.    Objective Vitals:   10/07/22 1856 10/07/22 1946 10/08/22 0532 10/08/22 0859  BP:  (!) 122/59 (!) 121/58 (!) 99/56  Pulse:  95 93 90  Resp:  19 18 18   Temp:  98.4 F (36.9 C) 98.5 F (36.9 C) 98.3 F (36.8 C)  TempSrc:  Oral Oral Oral  SpO2:  99% 97% 99%  Weight: 84 kg     Height:       Physical Exam General: chronically ill appearing female in NAD Heart: S1,S2 RRR no M/R/G Lungs: CTAB Abdomen: NABS NT Extremities: No LE edema Dialysis Access: L AVF +T/B   Additional Objective Labs: Basic Metabolic Panel: Recent Labs  Lab 10/05/22 1721 10/06/22 0053 10/06/22 1301 10/07/22 0038  NA 136 137  --  137  K 3.7 3.8  --  3.8  CL 90* 96*  --  93*  CO2 30 29  --  29  GLUCOSE 126* 130*  --  125*  BUN 15 17  --  28*  CREATININE 5.04* 5.45*  --  7.19*  CALCIUM 10.5* 10.1  --  9.7  PHOS  --   --  5.4* 5.1*   Liver Function Tests: Recent Labs  Lab 10/05/22 1721 10/07/22 0038  AST 18  --   ALT 15  --   ALKPHOS 87  --   BILITOT 0.7  --   PROT 7.8  --   ALBUMIN 3.6 3.1*   No results for input(s): "LIPASE", "AMYLASE" in the last 168 hours. CBC: Recent Labs  Lab 10/05/22 1721 10/05/22 2144 10/06/22 0053 10/07/22 0038  WBC 12.0* 13.1* 13.7* 10.9*  NEUTROABS 10.4*  --   --   --   HGB 12.2 12.0 11.6* 10.6*  HCT 39.2 37.5 37.4 34.2*  MCV 90.7 91.5 90.6 91.2  PLT 349 374 363 328   Blood Culture    Component Value Date/Time   SDES URINE, CATHETERIZED 10/05/2022 1843   SPECREQUEST NONE 10/05/2022 1843   CULT  10/05/2022 1843    NO GROWTH Performed at [facility], [address], [place], [place] [address]    REPTSTATUS 10/07/2022 FINAL 10/05/2022 1843    Cardiac Enzymes: No results for input(s): "CKTOTAL", "CKMB", "CKMBINDEX", "TROPONINI" in the last 168 hours. CBG: Recent Labs  Lab 10/06/22 1516 10/06/22 1915 10/06/22 2313 10/07/22 1949  10/08/22 0714  GLUCAP 151* 121* 134* 76 81   Iron Studies: No results for input(s): "IRON", "TIBC", "TRANSFERRIN", "FERRITIN" in the last 72 hours. @lablastinr3 @ Studies/Results: ECHOCARDIOGRAM COMPLETE  Result Date: 10/06/2022    ECHOCARDIOGRAM REPORT   Patient Name:   [name] Date of Exam: 10/06/2022 Medical Rec #:  [id number]    Height:       69.0 in Accession #:    [id number]   Weight:       188.5 lb Date of Birth:  Aug 07, 1960    BSA:          2.015 m Patient Age:    61 years     BP:           152/72 mmHg Patient Gender: F            HR:           97 bpm. Exam Location:  Inpatient Procedure: 2D Echo, Color Doppler and Cardiac Doppler Indications:    Endocarditis  History:  Patient has prior history of Echocardiogram examinations, most                 recent 07/26/2022. Risk Factors:Hypertension, Diabetes,                 Dyslipidemia and Sleep Apnea.  Sonographer:    Irving Burton Senior RDCS Referring Phys: [phone] GRACE E BOWSER IMPRESSIONS  1. Left ventricular ejection fraction, by estimation, is 55 to 60%. The left ventricle has normal function. The left ventricle has no regional wall motion abnormalities. There is mild concentric left ventricular hypertrophy. Left ventricular diastolic parameters are consistent with Grade I diastolic dysfunction (impaired relaxation).  2. Right ventricular systolic function is normal. The right ventricular size is normal. There is mildly elevated pulmonary artery systolic pressure.  3. Left atrial size was mildly dilated.  4. The mitral valve is abnormal. Mild mitral valve regurgitation. Moderate mitral stenosis. The mean mitral valve gradient is 8.0 mmHg. Severe mitral annular calcification.  5. The tricuspid valve is abnormal. Tricuspid valve regurgitation is mild to moderate.  6. The aortic valve is tricuspid. Aortic valve regurgitation is not visualized. Aortic valve sclerosis is present, with no evidence of aortic valve stenosis.  7. The inferior vena cava is  normal in size with <50% respiratory variability, suggesting right atrial pressure of 8 mmHg. FINDINGS  Left Ventricle: Left ventricular ejection fraction, by estimation, is 55 to 60%. The left ventricle has normal function. The left ventricle has no regional wall motion abnormalities. The left ventricular internal cavity size was normal in size. There is  mild concentric left ventricular hypertrophy. Left ventricular diastolic parameters are consistent with Grade I diastolic dysfunction (impaired relaxation). Right Ventricle: The right ventricular size is normal. No increase in right ventricular wall thickness. Right ventricular systolic function is normal. There is mildly elevated pulmonary artery systolic pressure. The tricuspid regurgitant velocity is 2.65  m/s, and with an assumed right atrial pressure of 8 mmHg, the estimated right ventricular systolic pressure is 36.1 mmHg. Left Atrium: Left atrial size was mildly dilated. Right Atrium: Right atrial size was normal in size. Pericardium: There is no evidence of pericardial effusion. Mitral Valve: The mitral valve is abnormal. Severe mitral annular calcification. Mild mitral valve regurgitation. Moderate mitral valve stenosis. MV peak gradient, 15.1 mmHg. The mean mitral valve gradient is 8.0 mmHg. Tricuspid Valve: The tricuspid valve is abnormal. Tricuspid valve regurgitation is mild to moderate. Aortic Valve: The aortic valve is tricuspid. Aortic valve regurgitation is not visualized. Aortic valve sclerosis is present, with no evidence of aortic valve stenosis. Pulmonic Valve: The pulmonic valve was normal in structure. Pulmonic valve regurgitation is trivial. Aorta: The aortic root and ascending aorta are structurally normal, with no evidence of dilitation. Venous: The inferior vena cava is normal in size with less than 50% respiratory variability, suggesting right atrial pressure of 8 mmHg. IAS/Shunts: No atrial level shunt detected by color flow Doppler.   LEFT VENTRICLE PLAX 2D LVIDd:         4.80 cm LVIDs:         3.50 cm LV PW:         1.20 cm LV IVS:        1.20 cm LVOT diam:     2.10 cm LV SV:         48 LV SV Index:   24 LVOT Area:     3.46 cm  RIGHT VENTRICLE RV S prime:     14.10 cm/s TAPSE (M-mode): 2.2  cm LEFT ATRIUM             Index        RIGHT ATRIUM           Index LA diam:        3.60 cm 1.79 cm/m   RA Area:     15.10 cm LA Vol (A2C):   67.1 ml 33.31 ml/m  RA Volume:   41.10 ml  20.40 ml/m LA Vol (A4C):   73.4 ml 36.43 ml/m LA Biplane Vol: 75.3 ml 37.38 ml/m  AORTIC VALVE LVOT Vmax:   95.40 cm/s LVOT Vmean:  77.200 cm/s LVOT VTI:    0.138 m  AORTA Ao Root diam: 3.00 cm Ao Asc diam:  2.80 cm MITRAL VALVE                TRICUSPID VALVE MV Area (PHT): 3.87 cm     TR Peak grad:   28.1 mmHg MV Area VTI:   1.56 cm     TR Vmax:        265.00 cm/s MV Peak grad:  15.1 mmHg MV Mean grad:  8.0 mmHg     SHUNTS MV Vmax:       1.94 m/s     Systemic VTI:  0.14 m MV Vmean:      129.5 cm/s   Systemic Diam: 2.10 cm MV Decel Time: 196 msec MV E velocity: 128.00 cm/s MV A velocity: 174.00 cm/s MV E/A ratio:  0.74 Donato Schultz MD Electronically signed by Donato Schultz MD Signature Date/Time: 10/06/2022/12:50:08 PM    Final    Medications:   aspirin  81 mg Oral Daily   atorvastatin  40 mg Oral Daily   Chlorhexidine Gluconate Cloth  6 each Topical Daily   Chlorhexidine Gluconate Cloth  6 each Topical Q0600   heparin  5,000 Units Subcutaneous Q8H   isosorbide-hydrALAZINE  1 tablet Oral Q12H   metoCLOPramide  10 mg Oral TID AC & HS   metoprolol tartrate  25 mg Oral BID   pantoprazole  40 mg Oral Daily     [facility]  4h  400/500  85.4kg  2/2 bath  AVF  Heparin 4000 units IV + 2000 units IV midrun - last HD 5/31, post wt 84.9kg   - mircera 50 mcg IV q 2 wks, last 5/31, due 6/14 - last Hb 10.6       Assessment/ Plan: Hypertensive urgency - prob due to not keeping her BP meds down w/ N/V at SNF.  Off of cardene gtt. BP's better now that she is  back on her home meds bidil and metoprolol.  N/V/ gastroparesis - getting reglan and IV antiemetics ESRD - on HD MWF. Has not missed HD. HD 10/10/2022 on schedule.  Volume - Now under OP EDW. Will need lower EDW on discharge.  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 Add on phos. Is not on vdra's. Hold phoslo (Ca++ containing) until phos is back.  DM2 - getting insulin here H/o prior CVA H/o infective endocarditis - +corynebacterium, completed IV vanc course on 09/06/22 Disposition - hopefully can be discharged back to SNF after HD.   Indra Wolters H. Quantasia Stegner NP-C 10/08/2022, 11:24 AM  [employer] [phone]

## 2022-10-08 NOTE — Progress Notes (Addendum)
PT Cancellation Note  Patient Details Name: JANNATUL DROZE MRN: [id number] DOB: March 19, 1961   Cancelled Treatment:    Reason Eval/Treat Not Completed: PT screened, no needs identified, will sign off; noted OT already completed assessment today including sit to stand at the bedside with mod A of 2.  Note she is from [place] and has been up to chair via lift there and plans to return.  No further acute skilled PT needs, but recommend PT at facility.  Will sign off.    Elray Mcgregor 10/08/2022, 2:13 PM Sheran Lawless, PT Acute Rehabilitation Services [phone] 10/08/2022

## 2022-10-09 NOTE — Plan of Care (Signed)
[facility] Patient Discharge Orders- [facility] CLINIC: [name]  Patient's name: [name] Admit/DC Dates: 10/05/2022 - 10/08/2022  Discharge Diagnoses: Hypertensive urgency  Gastroparesis  Aranesp: Given: NA   Date and amount of last dose: NA  Last Hgb: 10.6 PRBC's Given: NA Date/# of units: NA ESA dose for discharge: mircera 50 mcg IV q 2 weeks  IV Iron dose at discharge: per protocol  Heparin change: NA  EDW Change: Yes New EDW: 84 kg  Bath Change: NA  Access intervention/Change: NA Details:  [name]/Calcitriol change: Hold Corrected Calcium 10.4  Discharge Labs: Calcium 9.7 Phosphorus 5.1 Albumin 3.1 K+ 3.8  IV Antibiotics: NA Details:  On Coumadin?: NA Last INR: Next INR: Managed By:   OTHER/APPTS/LAB ORDERS:    D/C Meds to be reconciled by nurse after every discharge.  Completed By: Alonna Buckler [facility] [phone]   Reviewed by: MD:______ RN_______

## 2022-10-10 LAB — CULTURE, BLOOD (ROUTINE X 2)
Culture: NO GROWTH
Culture: NO GROWTH

## 2022-10-30 ENCOUNTER — Ambulatory Visit: Payer: Medicaid Other | Admitting: Neurology

## 2022-10-31 ENCOUNTER — Encounter: Payer: Self-pay | Admitting: Neurology

## 2022-10-31 ENCOUNTER — Ambulatory Visit (INDEPENDENT_AMBULATORY_CARE_PROVIDER_SITE_OTHER): Payer: 59 | Admitting: Neurology

## 2022-10-31 VITALS — BP 139/68 | HR 96

## 2022-10-31 DIAGNOSIS — I339 Acute and subacute endocarditis, unspecified: Secondary | ICD-10-CM

## 2022-10-31 DIAGNOSIS — R29898 Other symptoms and signs involving the musculoskeletal system: Secondary | ICD-10-CM | POA: Diagnosis not present

## 2022-10-31 DIAGNOSIS — I6310 Cerebral infarction due to embolism of unspecified precerebral artery: Secondary | ICD-10-CM | POA: Diagnosis not present

## 2022-10-31 NOTE — Patient Instructions (Signed)
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 I also advised the patient to eat a healthy diet with plenty of whole grains, cereals, fruits and vegetables, exercise regularly and maintain ideal body weight .continue on physical and Occupational Therapy.  Followup in the future with my nurse practitioner in 6 months or call earlier if necessary.  Stroke Prevention Some medical conditions and behaviors can lead to a higher chance of having a stroke. You can help prevent a stroke by eating healthy, exercising, not smoking, and managing any medical conditions you have. Stroke is a leading cause of functional impairment. Primary prevention is particularly important because a majority of strokes are first-time events. Stroke changes the lives of not only those who experience a stroke but also their family and other caregivers. How can this condition affect me? A stroke is a medical emergency and should be treated right away. A stroke can lead to brain damage and can sometimes be life-threatening. If a person gets medical treatment right away, there is a better chance of surviving and recovering from a stroke. What can increase my risk? The following medical conditions may increase your risk of a stroke: Cardiovascular disease. High blood pressure (hypertension). Diabetes. High cholesterol. Sickle cell disease. Blood clotting disorders (hypercoagulable state). Obesity. Sleep disorders (obstructive sleep apnea). Other risk factors include: Being older than age 27. Having a history of blood clots, stroke, or  mini-stroke (transient ischemic attack, TIA). Genetic factors, such as race, ethnicity, or a family history of stroke. Smoking cigarettes or using other tobacco products. Taking birth control pills, especially if you also use tobacco. Heavy use of alcohol or drugs, especially cocaine and methamphetamine. Physical inactivity. What actions can I take to prevent this? Manage your health conditions High cholesterol levels. Eating a healthy diet is important for preventing high cholesterol. If cholesterol cannot be managed through diet alone, you may need to take medicines. Take any prescribed medicines to control your cholesterol as told by your health care provider. Hypertension. To reduce your risk of stroke, try to keep your blood pressure below 130/80. Eating a healthy diet and exercising regularly are important for controlling blood pressure. If these steps are not enough to manage your blood pressure, you may need to take medicines. Take any prescribed medicines to control hypertension as told by your health care provider. Ask your health care provider if you should monitor your blood pressure at home. Have your blood pressure checked every year, even if your blood pressure is normal. Blood pressure increases with age and some medical conditions. Diabetes. Eating a healthy diet and exercising regularly are important parts of managing your blood sugar (glucose). If your blood sugar cannot be managed through diet and exercise, you may need to take medicines. Take any prescribed medicines to control your diabetes as told by your health care provider. Get evaluated for obstructive sleep apnea. Talk to your health care provider about getting a sleep evaluation if you snore a lot or have excessive sleepiness. Make sure that any other medical conditions you have, such as atrial fibrillation or atherosclerosis, are managed. Nutrition Follow instructions from your health care provider about what to  eat or  drink to help manage your health condition. These instructions may include: Reducing your daily calorie intake. Limiting how much salt (sodium) you use to 1,500 milligrams (mg) each day. Using only healthy fats for cooking, such as olive oil, canola oil, or sunflower oil. Eating healthy foods. You can do this by: Choosing foods that are high in fiber, such as whole grains, and fresh fruits and vegetables. Eating at least 5 servings of fruits and vegetables a day. Try to fill one-half of your plate with fruits and vegetables at each meal. Choosing lean protein foods, such as lean cuts of meat, poultry without skin, fish, tofu, beans, and nuts. Eating low-fat dairy products. Avoiding foods that are high in sodium. This can help lower blood pressure. Avoiding foods that have saturated fat, trans fat, and cholesterol. This can help prevent high cholesterol. Avoiding processed and prepared foods. Counting your daily carbohydrate intake.  Lifestyle If you drink alcohol: Limit how much you have to: 0-1 drink a day for women who are not pregnant. 0-2 drinks a day for men. Know how much alcohol is in your drink. In the [place] one drink equals one 12 oz bottle of beer (310m), one 5 oz glass of wine (1436m, or one 1 oz glass of hard liquor (4479m Do not use any products that contain nicotine or tobacco. These products include cigarettes, chewing tobacco, and vaping devices, such as e-cigarettes. If you need help quitting, ask your health care provider. Avoid secondhand smoke. Do not use drugs. Activity  Try to stay at a healthy weight. Get at least 30 minutes of exercise on most days, such as: Fast walking. Biking. Swimming. Medicines Take over-the-counter and prescription medicines only as told by your health care provider. Aspirin or blood thinners (antiplatelets or anticoagulants) may be recommended to reduce your risk of forming blood clots that can lead to stroke. Avoid taking  birth control pills. Talk to your health care provider about the risks of taking birth control pills if: You are over 35 29ars old. You smoke. You get very bad headaches. You have had a blood clot. Where to find more information American Stroke Association: www.strokeassociation.org Get help right away if: You or a loved one has any symptoms of a stroke. "BE FAST" is an easy way to remember the main warning signs of a stroke: B - Balance. Signs are dizziness, sudden trouble walking, or loss of balance. E - Eyes. Signs are trouble seeing or a sudden change in vision. F - Face. Signs are sudden weakness or numbness of the face, or the face or eyelid drooping on one side. A - Arms. Signs are weakness or numbness in an arm. This happens suddenly and usually on one side of the body. S - Speech. Signs are sudden trouble speaking, slurred speech, or trouble understanding what people say. T - Time. Time to call emergency services. Write down what time symptoms started. You or a loved one has other signs of a stroke, such as: A sudden, severe headache with no known cause. Nausea or vomiting. Seizure. These symptoms may represent a serious problem that is an emergency. Do not wait to see if the symptoms will go away. Get medical help right away. Call your local emergency services (911 in the [place] Do not drive yourself to the hospital. Summary You can help to prevent a stroke by eating healthy, exercising, not smoking, limiting alcohol intake, and managing any medical conditions you may have. Do not use any products that contain nicotine  or tobacco. These include cigarettes, chewing tobacco, and vaping devices, such as e-cigarettes. If you need help quitting, ask your health care provider. Remember "BE FAST" for warning signs of a stroke. Get help right away if you or a loved one has any of these signs. This information is not intended to replace advice given to you by your health care provider. Make  sure you discuss any questions you have with your health care provider. Document Revised: 03/25/2022 Document Reviewed: 03/25/2022 Elsevier Patient Education  2024 [employer].

## 2022-10-31 NOTE — Progress Notes (Signed)
[facility] [address] [place]. [place] [address] [phone]       OFFICE FOLLOW-UP NOTE  Ms. BRITTANNI CARIKER Date of Birth:  Mar 17, 1961 Medical Record Number:  [id number]   HPI: Ms Chouinard is a pleasant 62 year old African-American lady with past medical history of hypertension, hyperlipidemia, diabetes, end-stage renal disease on hemodialysis.  She was initially admitted on 09/18/2019 for left hemiparesis, slurred speech and facial droop.  CT head showed no acute findings but old frontal cortical and subcortical infarct.  MRI showed mild left cerebellum infarct as well as punctate periventricular white matter infarct in the right lateral ventricle.  CT angiogram of the head and neck showed no large vessel occlusion but mild atelectatic changes at both carotid palpitations without significant echocardiogram showing ejection fraction of 55%.  LDL cholesterol 127 with replacement along with lipid and 30-day heart monitor was recommended.  She had residual mild left hemiparesis and word finding difficulties. She was seen again by neurology on  3/23 524 after initial admission for NSTEMI and tricuspid regurgitation.  She was treated with IV heparin initially and then transition to aspirin and heparin.  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  Hemoglobin A1c was 5.4.  CTA head and neck revealed no LVO.  He was not felt to be a candidate for emergent cardiac valve replacement surgery IV antibiotics as per ID.  Course of antibiotics on 09/06/2022.   He is currently he is yet not able to walk independently.  She cannot stand up and walk even with  therapist help...  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   Aortic atherosclerosis (HCC)    Arthritis of knee    bilateral   Cerebellar cerebrovascular accident (CVA) without late effect 09/23/2019   Coagulation defect, unspecified (HCC) 02/19/2019   Diabetes mellitus    type 2 - no meds   Diabetic gastroparesis (HCC)    Diverticulosis    ESRD (end stage renal disease) (HCC)    Fibroid uterus    GERD (gastroesophageal reflux disease)    Hyperlipidemia    Hypertension    Hypertensive emergency 02/05/2019   Hypokalemia    IDA (iron deficiency anemia)    Iron deficiency anemia, unspecified 02/24/2019   Nausea    Nausea and vomiting in adult 03/13/2022   Paroxysmal ventricular tachycardia (HCC)    Renal disorder    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)    Umbilical hernia    Wears dentures     Social History:  Social History   Socioeconomic History   Marital status: Legally Separated    Spouse name: Not on file   Number of children: 2   Years of education: 12   Highest education level: 12th grade  Occupational History   Occupation: housekeeping   Occupation: disabled  Tobacco Use   Smoking  status: Former    Years: 20    Types: Cigarettes    Quit date: 10/16/2011    Years since quitting: 11.0    Passive exposure: Past   Smokeless tobacco: Never   Tobacco comments:    1 pack will last a week  Vaping Use   Vaping Use: Never used  Substance and Sexual Activity   Alcohol use: No    Alcohol/week: 0.0 standard drinks of alcohol   Drug use: No   Sexual activity: Not Currently  Other Topics  Concern   Not on file  Social History Narrative   Patient and her son live together in [place].    Patient has (1) son and (1) daughter- they are very close.    Patient walks 30 minutes a day for exercise.    Patient is very active in her church community.    Social Determinants of Health   Financial Resource Strain: Low Risk  (05/31/2021)   Overall Financial Resource Strain (CARDIA)    Difficulty of Paying Living Expenses: Not hard at all  Food Insecurity: No Food Insecurity (09/04/2022)   Hunger Vital Sign    Worried About Running Out of Food in the Last Year: Never true    Ran Out of Food in the Last Year: Never true  Transportation Needs: No Transportation Needs (09/04/2022)   PRAPARE - Administrator, Civil Service (Medical): No    Lack of Transportation (Non-Medical): No  Physical Activity: Sufficiently Active (05/31/2021)   Exercise Vital Sign    Days of Exercise per Week: 7 days    Minutes of Exercise per Session: 30 min  Stress: No Stress Concern Present (05/31/2021)   [employer] of Occupational Health - Occupational Stress Questionnaire    Feeling of Stress : Only a little  Social Connections: Moderately Integrated (05/31/2021)   Social Connection and Isolation Panel [NHANES]    Frequency of Communication with Friends and Family: More than three times a week    Frequency of Social Gatherings with Friends and Family: More than three times a week    Attends Religious Services: More than 4 times per year    Active Member of [employer] or Organizations: Yes    Attends Banker Meetings: More than 4 times per year    Marital Status: Separated  Intimate Partner Violence: Not At Risk (09/04/2022)   Humiliation, Afraid, Rape, and Kick questionnaire    Fear of Current or Ex-Partner: No    Emotionally Abused: No    Physically Abused: No    Sexually Abused: No    Medications:   Current Outpatient Medications on File Prior to Visit  Medication Sig  Dispense Refill   Accu-Chek Softclix Lancets lancets Use as instructed (Patient taking differently: 1 each by Other route See admin instructions. Use as instructed) 100 each 12   acetaminophen (TYLENOL) 500 MG tablet Take 1,000 mg by mouth every 6 (six) hours as needed for moderate pain.     aspirin EC 81 MG tablet Take 1 tablet (81 mg total) by mouth daily. Swallow whole. 30 tablet 12   atorvastatin (LIPITOR) 40 MG tablet Take by mouth.     b complex-vitamin c-folic acid (NEPHRO-VITE) 0.8 MG TABS tablet Take 1 tablet by mouth at bedtime.     Calcium Acetate 667 MG TABS Take 667 mg by mouth in the morning, at noon, and at bedtime.     calcium carbonate (TUMS - DOSED IN MG ELEMENTAL CALCIUM) 500 MG chewable  tablet Chew 1-2 tablets (200-400 mg of elemental calcium total) by mouth 3 (three) times daily as needed for indigestion or heartburn (Abdominal pain). (Patient taking differently: Chew 1,000 mg by mouth 3 (three) times daily as needed for indigestion or heartburn.)     fluticasone (FLONASE) 50 MCG/ACT nasal spray Place 2 sprays into both nostrils daily. 16 g 6   glucose blood (ACCU-CHEK AVIVA PLUS) test strip Use as instructed (Patient taking differently: 1 each by Other route See admin instructions. Use as instructed) 100 each 12   isosorbide-hydrALAZINE (BIDIL) 20-37.5 MG tablet Take 1 tablet by mouth in the morning and at bedtime.     Lancets (ACCU-CHEK SOFT TOUCH) lancets Use to check sugars three times a day (Patient taking differently: 1 each by Other route in the morning, at noon, and at bedtime.) 100 each 12   Methoxy PEG-Epoetin Beta (MIRCERA IJ) Inject 1 Syringe into the skin every 14 (fourteen) days. Injection given during dialysis every 2 weeks, last dose given on 09/20/22.     metoCLOPramide (REGLAN) 10 MG tablet Take 1 tablet (10 mg total) by mouth 4 (four) times daily -  before meals and at bedtime. Take 1/2 to 1 tablet prn (Patient taking differently: Take 10 mg by mouth 4 (four)  times daily -  before meals and at bedtime. PRN order: take 10 mg by mouth every day as needed nausea) 30 tablet 1   metoprolol tartrate (LOPRESSOR) 25 MG tablet Take 1 tablet (25 mg total) by mouth 2 (two) times daily. 60 tablet 0   Nutritional Supplements (FEEDING SUPPLEMENT, NEPRO CARB STEADY,) LIQD Take 237 mLs by mouth in the morning and at bedtime.     pantoprazole (PROTONIX) 40 MG tablet Take 1 tablet (40 mg total) by mouth daily. (Patient taking differently: Take 40 mg by mouth at bedtime.) 90 tablet 3   polyethylene glycol (MIRALAX / GLYCOLAX) 17 g packet Take 17 g by mouth daily. 14 each 0   prochlorperazine (COMPAZINE) 5 MG tablet Take 1 tablet (5 mg total) by mouth every 6 (six) hours as needed for refractory nausea / vomiting. 30 tablet 0   nitroGLYCERIN (NITROSTAT) 0.4 MG SL tablet Place 1 tablet (0.4 mg total) under the tongue every 5 (five) minutes as needed for up to 10 days for chest pain. 10 tablet 0   No current facility-administered medications on file prior to visit.    Allergies:   Allergies  Allergen Reactions   Lisinopril Anaphylaxis and Other (See Comments)    angioedema   Venofer  [Ferric Oxide] Other (See Comments)     Back Pain   Camellia Swelling and Other (See Comments)    Angioedema    Jardiance [Empagliflozin] Swelling and Rash    Physical Exam General: Frail malnourished looking middle-aged African-American lady seated, in no evident distress Head: head normocephalic and atraumatic.  Neck: supple with no carotid or supraclavicular bruits Cardiovascular: regular rate and rhythm, no murmurs Musculoskeletal: no deformity Skin:  no rash/petichiae Vascular:  Normal pulses all extremities Vitals:   10/31/22 0937  BP: 139/68  Pulse: 96   Neurologic Exam Mental Status: Awake and fully alert. Oriented to place and time. Recent and remote memory intact. Attention span, concentration and fund of knowledge appropriate. Mood and affect appropriate.  Cranial  Nerves: Fundoscopic exam reveals sharp disc margins. Pupils equal, briskly reactive to light. Extraocular movements full without nystagmus. Visual fields full to confrontation. Hearing intact. Facial sensation intact.  Mild left lower facial weakness.  Tongue, palate moves normally and symmetrically.  Motor: Mild spastic left hemiparesis with 4/5 strength in the left upper extremity with weakness of grip and increasing hand muscles.  Mild left lower extremity drift with proximal left leg and ankle dorsiflexor weakness.  Mild right lower extremity weakness 4/5 proximally and distally.  Tone is increased on the left compared to the right. Sensory.: intact to touch ,pinprick .position and vibratory sensation.  Coordination: Rapid alternating movements normal in all extremities. Finger-to-nose and heel-to-shin performed accurately bilaterally. Gait and Station: Deferred as patient is in the wheelchair and unable to walk  reflexes: 2+ brisk and symmetric. Toes downgoing.   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  She has finished antibiotic course still has residual significant hemiplegia and gait difficulty     PLAN:I had a long d/w patient about her recent strokes from endocarditis,, risk for recurrent stroke/TIAs, personally independently reviewed imaging studies and stroke evaluation results and answered questions.Continue aspirin 81 mg daily  for secondary stroke prevention and maintain strict control of hypertension with blood pressure goal below 130/90, diabetes with hemoglobin A1c goal below 6.5% and lipids with LDL cholesterol goal below 70 mg/dL. I also advised the patient to eat a healthy diet with plenty of whole grains, cereals, fruits and vegetables, exercise regularly and maintain ideal body weight .continue on physical and Occupational Therapy.  Followup in the future with my nurse  practitioner in 6 months or call earlier if necessary. Greater than 50% of time during this 35 minute visit was spent on counseling,explanation of diagnosis of endocarditis and embolic strokes and residual hemiplegia, planning of further management, discussion with patient and family and coordination of care Delia Heady, MD Note: This document was prepared with digital dictation and possible smart phrase technology. Any transcriptional errors that result from this process are unintentional

## 2022-11-05 ENCOUNTER — Encounter: Payer: Self-pay | Admitting: Interventional Cardiology

## 2022-11-05 ENCOUNTER — Ambulatory Visit: Payer: 59 | Attending: Interventional Cardiology | Admitting: Interventional Cardiology

## 2022-11-05 VITALS — BP 118/58 | HR 96 | Ht 69.0 in | Wt 200.0 lb

## 2022-11-05 DIAGNOSIS — E7849 Other hyperlipidemia: Secondary | ICD-10-CM

## 2022-11-05 DIAGNOSIS — I3481 Nonrheumatic mitral (valve) annulus calcification: Secondary | ICD-10-CM

## 2022-11-05 DIAGNOSIS — Z8673 Personal history of transient ischemic attack (TIA), and cerebral infarction without residual deficits: Secondary | ICD-10-CM

## 2022-11-05 DIAGNOSIS — N186 End stage renal disease: Secondary | ICD-10-CM

## 2022-11-05 DIAGNOSIS — I059 Rheumatic mitral valve disease, unspecified: Secondary | ICD-10-CM

## 2022-11-05 DIAGNOSIS — Z992 Dependence on renal dialysis: Secondary | ICD-10-CM

## 2022-11-05 MED ORDER — AMOXICILLIN 500 MG PO TABS: ORAL_TABLET | ORAL | 3 refills | Status: AC

## 2022-11-05 NOTE — Patient Instructions (Signed)
Medication Instructions:  Your physician recommends that you continue on your current medications as directed. Please refer to the Current Medication list given to you today.  *If you need a refill on your cardiac medications before your next appointment, please call your pharmacy*   Lab Work: none If you have labs (blood work) drawn today and your tests are completely normal, you will receive your results only by: MyChart Message (if you have MyChart) OR A paper copy in the mail If you have any lab test that is abnormal or we need to change your treatment, we will call you to review the results.   Testing/Procedures: none   Follow-Up: At [facility], you and your health needs are our priority.  As part of our continuing mission to provide you with exceptional heart care, we have created designated Provider Care Teams.  These Care Teams include your primary Cardiologist (physician) and Advanced Practice Providers (APPs -  Physician Assistants and Nurse Practitioners) who all work together to provide you with the care you need, when you need it.  We recommend signing up for the patient portal called "MyChart".  Sign up information is provided on this After Visit Summary.  MyChart is used to connect with patients for Virtual Visits (Telemedicine).  Patients are able to view lab/test results, encounter notes, upcoming appointments, etc.  Non-urgent messages can be sent to your provider as well.   To learn more about what you can do with MyChart, go to [email or web address].    Your next appointment:   12 month(s)  Provider:   Jayadeep Varanasi, MD     Other Instructions Your physician discussed the importance of taking an antibiotic prior to any dental, gastrointestinal, genitourinary procedures to prevent damage to the heart valves from infection. You were given a prescription for an antibiotic based on current SBE prophylaxis guidelines.    

## 2022-11-05 NOTE — Progress Notes (Signed)
Cardiology Office Note   Date:  11/05/2022   ID:  [name], DOB 05/20/1960, MRN [id number]  PCP:  Lockie Mola, MD    No chief complaint on file.  Mitral valve disease  Wt Readings from Last 3 Encounters:  11/05/22 200 lb (90.7 kg)  10/07/22 185 lb 3 oz (84 kg)  09/04/22 200 lb (90.7 kg)       History of Present Illness: [name] is a 62 y.o. female  with a hx of HTN, HLD, DM 2, CVA, ESRD on HD (MWF).   Patient had a fall and prolonged time on the floor before being found by a family member. Our staff saw her 07/24/2022 for the evaluation of acute acute coronary syndrome. Troponins 6200, cath no obstructive disease, felt to have stress cardiomyopathy EF 35%. Also mobile mass in right atrium and on the side of aortic side of aortic valve in addition involves her mitral annulus with severe MAC. TEE showed recovered EF 60-65%, mobile mass attached to tricuspid annulus suggestive of vegetation, and mitral annulus calcification with central hypoechogenicity. 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 CT Coronary morph inconclusive for abscess versus caseating MAC. Recommended FDG PET CT. Infectious disease was consulted, ceftriaxone stopped, Vancomycin continued. Cardiothoracic Surgery consulted for possible surgical intervention, but patient not surgical candidate in setting of stroke and other medical problems below. Patient discharged on Vancomycin MWF during dialysis sessions for total antibiotic treatment course of 6 weeks with end date 09/05/2022. Dr. Laneta Simmers didn't feel she was a surgical candidate. Antibiotics recommended. She also had a CVA and is now in a rehab facility.   Post CVA, She struggled with speech but this is improved significantly since her hospital stay.  She is still in a nursing facility.  She does physical therapy.  She feels she is getting stronger but is not walking independently.   Denies : Chest pain. Dizziness. Leg edema. Nitroglycerin use.  Orthopnea. Palpitations. Paroxysmal nocturnal dyspnea. Shortness of breath. Syncope.       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   Aortic atherosclerosis (HCC)    Arthritis of knee    bilateral   Cerebellar cerebrovascular accident (CVA) without late effect 09/23/2019   Coagulation defect, unspecified (HCC) 02/19/2019   Diabetes mellitus    type 2 - no meds   Diabetic gastroparesis (HCC)    Diverticulosis    ESRD (end stage renal disease) (HCC)    Fibroid uterus    GERD (gastroesophageal reflux disease)    Hyperlipidemia    Hypertension    Hypertensive emergency 02/05/2019   Hypokalemia    IDA (iron deficiency anemia)    Iron deficiency anemia, unspecified 02/24/2019   Nausea    Nausea and vomiting in adult 03/13/2022   Paroxysmal ventricular tachycardia (HCC)    Renal disorder    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)    Umbilical hernia    Wears dentures     Past Surgical History:  Procedure Laterality Date   AV FISTULA PLACEMENT Left 02/17/2019   Procedure: ARTERIOVENOUS (AV) FISTULA CREATION LEFT UPPER ARM;  Surgeon: Maeola Harman, MD;  Location: [facility] OR;  Service: Vascular;  Laterality: Left;   CATARACT EXTRACTION     right eye   CESAREAN SECTION     x2   CHOLECYSTECTOMY     laparoscopic   CORONARY ANGIOGRAPHY N/A 07/25/2022   Procedure: CORONARY  ANGIOGRAPHY;  Surgeon: [place], [name], MD;  Location: [facility] INVASIVE CV LAB;  Service: Cardiovascular;  Laterality: N/A;   FISTULA SUPERFICIALIZATION Left 04/06/2019   Procedure: FISTULA SUPERFICIALIZATION LEFT BRACHIOCEPHALIC;  Surgeon: Maeola Harman, MD;  Location: [facility] OR;  Service: Vascular;  Laterality: Left;  Transposition left arm brachiocephalic fistula.   IR FLUORO GUIDE CV LINE RIGHT  02/09/2019   IR FLUORO GUIDE CV LINE RIGHT  03/05/2019   IR US GUIDE VASC ACCESS RIGHT  02/09/2019   MULTIPLE TOOTH  EXTRACTIONS     REDUCTION MAMMAPLASTY Bilateral    TEE WITHOUT CARDIOVERSION N/A 07/26/2022   Procedure: TRANSESOPHAGEAL ECHOCARDIOGRAM (TEE);  Surgeon: Thurmon Fair, MD;  Location: [facility] ENDOSCOPY;  Service: Cardiovascular;  Laterality: N/A;     Current Outpatient Medications  Medication Sig Dispense Refill   acetaminophen (TYLENOL) 500 MG tablet Take 1,000 mg by mouth every 6 (six) hours as needed for moderate pain.     amoxicillin (AMOXIL) 500 MG tablet Take 4 tablets by mouth one hour prior to dental appointments 4 tablet 3   aspirin EC 81 MG tablet Take 1 tablet (81 mg total) by mouth daily. Swallow whole. 30 tablet 12   atorvastatin (LIPITOR) 40 MG tablet Take by mouth.     b complex-vitamin c-folic acid (NEPHRO-VITE) 0.8 MG TABS tablet Take 1 tablet by mouth at bedtime.     Calcium Acetate 667 MG TABS Take 667 mg by mouth in the morning, at noon, and at bedtime.     calcium carbonate (TUMS - DOSED IN MG ELEMENTAL CALCIUM) 500 MG chewable tablet Chew 1-2 tablets (200-400 mg of elemental calcium total) by mouth 3 (three) times daily as needed for indigestion or heartburn (Abdominal pain). (Patient taking differently: Chew 1,000 mg by mouth 3 (three) times daily as needed for indigestion or heartburn.)     fluticasone (FLONASE) 50 MCG/ACT nasal spray Place 2 sprays into both nostrils daily. 16 g 6   isosorbide-hydrALAZINE (BIDIL) 20-37.5 MG tablet Take 1 tablet by mouth in the morning and at bedtime.     metoCLOPramide (REGLAN) 10 MG tablet Take 1 tablet (10 mg total) by mouth 4 (four) times daily -  before meals and at bedtime. Take 1/2 to 1 tablet prn (Patient taking differently: Take 10 mg by mouth 4 (four) times daily -  before meals and at bedtime. PRN order: take 10 mg by mouth every day as needed nausea) 30 tablet 1   metoprolol tartrate (LOPRESSOR) 25 MG tablet Take 1 tablet (25 mg total) by mouth 2 (two) times daily. 60 tablet 0   nitroGLYCERIN (NITROSTAT) 0.4 MG SL tablet Place 1  tablet (0.4 mg total) under the tongue every 5 (five) minutes as needed for up to 10 days for chest pain. 10 tablet 0   Nutritional Supplements (FEEDING SUPPLEMENT, NEPRO CARB STEADY,) LIQD Take 237 mLs by mouth in the morning and at bedtime.     pantoprazole (PROTONIX) 40 MG tablet Take 1 tablet (40 mg total) by mouth daily. (Patient taking differently: Take 40 mg by mouth at bedtime.) 90 tablet 3   polyethylene glycol (MIRALAX / GLYCOLAX) 17 g packet Take 17 g by mouth daily. 14 each 0   Accu-Chek Softclix Lancets lancets Use as instructed (Patient not taking: Reported on 11/05/2022) 100 each 12   glucose blood (ACCU-CHEK AVIVA PLUS) test strip Use as instructed (Patient not taking: Reported on 11/05/2022) 100 each 12   Lancets (ACCU-CHEK SOFT TOUCH) lancets Use to check sugars three times  a day (Patient taking differently: 1 each by Other route in the morning, at noon, and at bedtime.) 100 each 12   Methoxy PEG-Epoetin Beta (MIRCERA IJ) Inject 1 Syringe into the skin every 14 (fourteen) days. Injection given during dialysis every 2 weeks, last dose given on 09/20/22. (Patient not taking: Reported on 11/05/2022)     prochlorperazine (COMPAZINE) 5 MG tablet Take 1 tablet (5 mg total) by mouth every 6 (six) hours as needed for refractory nausea / vomiting. (Patient not taking: Reported on 11/05/2022) 30 tablet 0   No current facility-administered medications for this visit.    Allergies:   Lisinopril, Venofer  [ferric oxide], Camellia, and Jardiance [empagliflozin]    Social History:  The patient  reports that she quit smoking about 11 years ago. Her smoking use included cigarettes. She has been exposed to tobacco smoke. She has never used smokeless tobacco. She reports that she does not drink alcohol and does not use drugs.   Family History:  The patient's family history includes Cancer in her father; Diabetes in her mother; Hypertension in her mother.    ROS:  Please see the history of present  illness.   Otherwise, review of systems are positive for still not walking.   All other systems are reviewed and negative.    PHYSICAL EXAM: VS:  BP (!) 118/58   Pulse 96   Ht 5\' 9"  (1.753 m)   Wt 200 lb (90.7 kg) Comment: as reported..wheel chair  LMP  (LMP Unknown)   SpO2 98%   BMI 29.53 kg/m  , BMI Body mass index is 29.53 kg/m. GEN: Well nourished, well developed, in no acute distress; speech improved from when I saw her in the hospital HEENT: normal Neck: no JVD, carotid bruits, or masses Cardiac: RRR; no murmurs, rubs, or gallops,no edema  Respiratory:  clear to auscultation bilaterally, normal work of breathing GI: soft, nontender, nondistended, + BS MS: no deformity or atrophy Skin: warm and dry, no rash Neuro: In wheelchair Psych: euthymic mood, flat affect   EKG:   The ekg ordered October 07, 2022 demonstrates sinus tachycardia   Recent Labs: 10/05/2022: ALT 15; B Natriuretic Peptide 2,051.8; Magnesium 2.3 10/07/2022: BUN 28; Creatinine, Ser 7.19; Hemoglobin 10.6; Platelets 328; Potassium 3.8; Sodium 137   Lipid Panel    Component Value Date/Time   CHOL 198 07/25/2022 0158   CHOL 222 (H) 01/30/2021 1039   TRIG 133 07/25/2022 0158   HDL 38 (L) 07/25/2022 0158   HDL 60 01/30/2021 1039   CHOLHDL 5.2 07/25/2022 0158   VLDL 27 07/25/2022 0158   LDLCALC 133 (H) 07/25/2022 0158   LDLCALC 147 (H) 01/30/2021 1039     Other studies Reviewed: Additional studies/ records that were reviewed today with results demonstrating: Records reviewed.  Infectious disease note reviewed.  Potassium 3.8 in June 2024.   ASSESSMENT AND PLAN:  Mitral valve disease: Prior endocarditis and caseating mitral annular calcification.  Not a surgical candidate due to other comorbidities.  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 Needs SBE prophylaxis prior to dental visits.  She should have dental cleanings 2x/year. ESRD: Dialysis Monday Wednesday Friday.  Tolerating dialysis well. CVA: Much  improved speech from before.  Continue Physical therapy and trying to get stronger Hyperlipidemia: LDL 133 in March 2024.  Started on atorvastatin 40 mg daily.  Normal LFTs in June 2024   Current medicines are reviewed at length with the patient today.  The patient concerns regarding her medicines were addressed.  The following changes have been made:  No change  Labs/ tests ordered today include:  No orders of the defined types were placed in this encounter.   Recommend 150 minutes/week of aerobic exercise Low fat, low carb, high fiber diet recommended  Disposition:   FU in 1 year   Signed, Lance Muss, MD  11/05/2022 9:54 AM    [facility] [address] [place], [place], [place]  [address] Phone: [phone]; Fax: [phone]

## 2022-11-12 ENCOUNTER — Other Ambulatory Visit: Payer: Self-pay

## 2022-11-12 ENCOUNTER — Encounter: Payer: Self-pay | Admitting: Internal Medicine

## 2022-11-12 ENCOUNTER — Ambulatory Visit (INDEPENDENT_AMBULATORY_CARE_PROVIDER_SITE_OTHER): Payer: 59 | Admitting: Internal Medicine

## 2022-11-12 VITALS — BP 120/77 | HR 86 | Temp 97.4°F

## 2022-11-12 DIAGNOSIS — I330 Acute and subacute infective endocarditis: Secondary | ICD-10-CM | POA: Diagnosis not present

## 2022-11-12 NOTE — Progress Notes (Signed)
Patient ID: [name], female   DOB: 1960-06-23, 62 y.o.   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 Has been receiving vancomycin with hd, which she takes m-w-f. Finished iv abtx on 5/3.  She was last seen during her hospitalization on 5/30 thru 6/4 for hypertensive crisis, she was still being treated for bacteremia with positive blood cultures for corynebacterium and a complex mass in the posterior mitral annulus that could be severe caseating MAC or an annular abscess or both.  She has severe mitral annular calcification.  There is only mild mitral regurgitation and no mitral stenosis.  There is also a mass in the right atrium suggesting vegetation.  She appears to have had an embolic strokewith development of dysarthria and left-sided weakness with brain MRI showing multiple scattered infarcts throughout both cerebral hemispheres and pons.  a poor operative candidate. She remains in SNF. Doing pt/ot. Able to stand up, slide and pivot in physical therapy - making progress. She followed up with dr Nechama Guard in early July. Tolerating with hemodialysis. No diarrhea or yeast infection as side effect from diarrhea Microbiology: 3/19 1 bottle: corynbacteremia plus staph capitis 3/19 1 bottle: corynbacteremia plus enterococcus  Outpatient Encounter Medications as of 11/12/2022  Medication Sig   acetaminophen (TYLENOL) 500 MG tablet Take 1,000 mg by mouth every 6 (six) hours as needed for moderate pain.   amoxicillin (AMOXIL) 500 MG tablet Take 4 tablets by mouth one hour prior to dental appointments   aspirin EC 81 MG tablet Take 1 tablet (81 mg total) by mouth daily. Swallow whole.   atorvastatin (LIPITOR) 40 MG tablet Take by mouth.   b complex-vitamin c-folic acid (NEPHRO-VITE) 0.8 MG TABS tablet Take 1 tablet by mouth at bedtime.   Calcium Acetate 667 MG TABS Take 667 mg by mouth in  the morning, at noon, and at bedtime.   calcium carbonate (TUMS - DOSED IN MG ELEMENTAL CALCIUM) 500 MG chewable tablet Chew 1-2 tablets (200-400 mg of elemental calcium total) by mouth 3 (three) times daily as needed for indigestion or heartburn (Abdominal pain). (Patient taking differently: Chew 1,000 mg by mouth 3 (three) times daily as needed for indigestion or heartburn.)   fluticasone (FLONASE) 50 MCG/ACT nasal spray Place 2 sprays into both nostrils daily.   isosorbide-hydrALAZINE (BIDIL) 20-37.5 MG tablet Take 1 tablet by mouth in the morning and at bedtime.   metoCLOPramide (REGLAN) 10 MG tablet Take 1 tablet (10 mg total) by mouth 4 (four) times daily -  before meals and at bedtime. Take 1/2 to 1 tablet prn (Patient taking differently: Take 10 mg by mouth 4 (four) times daily -  before meals and at bedtime. PRN order: take 10 mg by mouth every day as needed nausea)   metoprolol tartrate (LOPRESSOR) 25 MG tablet Take 1 tablet (25 mg total) by mouth 2 (two) times daily.   Nutritional Supplements (FEEDING SUPPLEMENT, NEPRO CARB STEADY,) LIQD Take 237 mLs by mouth in the morning and at bedtime.   pantoprazole (PROTONIX) 40 MG tablet Take 1 tablet (40 mg total) by mouth daily. (Patient taking differently: Take 40 mg by mouth at bedtime.)   polyethylene glycol (MIRALAX / GLYCOLAX) 17 g packet Take 17 g by mouth daily.   Accu-Chek Softclix Lancets lancets Use as instructed (Patient not taking: Reported on 11/05/2022)   glucose blood (ACCU-CHEK AVIVA PLUS) test strip Use as  instructed (Patient not taking: Reported on 11/05/2022)   Lancets (ACCU-CHEK SOFT TOUCH) lancets Use to check sugars three times a day (Patient not taking: Reported on 11/12/2022)   Methoxy PEG-Epoetin Beta (MIRCERA IJ) Inject 1 Syringe into the skin every 14 (fourteen) days. Injection given during dialysis every 2 weeks, last dose given on 09/20/22. (Patient not taking: Reported on 11/05/2022)   nitroGLYCERIN (NITROSTAT) 0.4 MG SL tablet  Place 1 tablet (0.4 mg total) under the tongue every 5 (five) minutes as needed for up to 10 days for chest pain.   prochlorperazine (COMPAZINE) 5 MG tablet Take 1 tablet (5 mg total) by mouth every 6 (six) hours as needed for refractory nausea / vomiting. (Patient not taking: Reported on 11/05/2022)   No facility-administered encounter medications on file as of 11/12/2022.     Patient Active Problem List   Diagnosis Date Noted   Hypertensive urgency 10/05/2022   Gastroparesis 10/05/2022   Hypertensive crisis 10/05/2022   Intractable nausea and vomiting 09/04/2022   Acute bacterial endocarditis 07/26/2022   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) 07/24/2022   ESRD on dialysis [facility])    History of stroke 09/18/2019   Type 2 diabetes mellitus with diabetic chronic kidney disease (HCC) 02/19/2019   Anemia in chronic kidney disease 02/19/2019   ESRD (end stage renal disease) (HCC) 02/19/2019   Secondary hyperparathyroidism of renal origin (HCC) 02/19/2019   Venous (peripheral) insufficiency 08/06/2017   Hypertension associated with diabetes (HCC)    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   Diabetic gastroparesis (HCC) 08/09/2015   Nausea and vomiting 01/18/2012   Obstructive sleep apnea of adult 01/16/2012   Osteoarthritis of both knees 01/16/2012   Hyperlipidemia associated with type 2 diabetes mellitus (HCC) 07/18/2011   LEIOMYOMA, UTERUS 01/14/2007   Former smoker 07/03/2006     Health Maintenance Due  Topic Date Due   Zoster Vaccines- Shingrix (1 of 2) Never done   FOOT EXAM  08/20/2016   OPHTHALMOLOGY EXAM  12/10/2019   Colonoscopy  05/13/2021   DTaP/Tdap/Td (2 - Td or Tdap) 05/16/2021   COVID-19 Vaccine (3 - 2023-24 season) 01/04/2022   Medicare Annual Wellness (AWV)  05/31/2022   MAMMOGRAM  10/20/2022     Review of Systems 12 point ros is negative except what is mentioned above Physical Exam   BP 120/77   Pulse 86   Temp (!) 97.4 F (36.3 C) (Temporal)    LMP  (LMP Unknown)   SpO2 98%    Physical Exam  Constitutional:  oriented to person, place, and time. appears well-developed and well-nourished. No distress.  HENT: [place]/AT, PERRLA, no scleral icterus Mouth/Throat: Oropharynx is clear and moist. No oropharyngeal exudate.  Cardiovascular: Normal rate, regular rhythm and normal heart sounds. Exam reveals no gallop and no friction rub.  No murmur heard.  Pulmonary/Chest: Effort normal and breath sounds normal. No respiratory distress.  has no wheezes.  Neck = supple, no nuchal rigidity Skin: Skin is warm and dry. No rash noted. No erythema.  Psychiatric: a normal mood and affect.  behavior is normal.    CBC Lab Results  Component Value Date   WBC 10.9 (H) 10/07/2022   RBC 3.75 (L) 10/07/2022   HGB 10.6 (L) 10/07/2022   HCT 34.2 (L) 10/07/2022   PLT 328 10/07/2022   MCV 91.2 10/07/2022   MCH 28.3 10/07/2022   MCHC 31.0 10/07/2022   RDW 15.8 (H) 10/07/2022   LYMPHSABS 1.1 10/05/2022   MONOABS 0.4 10/05/2022  EOSABS 0.0 10/05/2022    BMET Lab Results  Component Value Date   NA 137 10/07/2022   K 3.8 10/07/2022   CL 93 (L) 10/07/2022   CO2 29 10/07/2022   GLUCOSE 125 (H) 10/07/2022   BUN 28 (H) 10/07/2022   CREATININE 7.19 (H) 10/07/2022   CALCIUM 9.7 10/07/2022   GFRNONAA 6 (L) 10/07/2022   GFRAA 4 (L) 10/11/2019      Assessment and Plan Corynebacterium MV endocarditis = completed course of iv abtx.No further labwork Continue with plan for sbe proph with dental work per cardiology  Health maintenance = in the Fall get flu and covid vaccine  Rtc if needed

## 2022-12-12 ENCOUNTER — Ambulatory Visit (INDEPENDENT_AMBULATORY_CARE_PROVIDER_SITE_OTHER): Payer: Medicare Other | Admitting: Podiatry

## 2022-12-12 VITALS — BP 132/64

## 2022-12-12 DIAGNOSIS — M79674 Pain in right toe(s): Secondary | ICD-10-CM

## 2022-12-12 DIAGNOSIS — B351 Tinea unguium: Secondary | ICD-10-CM

## 2022-12-12 DIAGNOSIS — M79675 Pain in left toe(s): Secondary | ICD-10-CM | POA: Diagnosis not present

## 2022-12-12 NOTE — Progress Notes (Signed)
  Subjective:  Patient ID: [name], female    DOB: Nov 20, 1960,  MRN: [id number]  Chief Complaint  Patient presents with   Nail Problem    np - dfc nail trim / nail fungus,patient has a wound on the right heel per patient is being treated by nursing home    62 y.o. female presents with the above complaint. History confirmed with patient.   Objective:  Physical Exam: warm, good capillary refill, normal sensory exam, and +1 DP and PT pulse. Left Foot: dystrophic yellowed discolored nail plates with subungual debris Right Foot: dystrophic yellowed discolored nail plates with subungual debris and deep tissue injury heel  Assessment:   1. Pain due to onychomycosis of toenails of both feet      Plan:  Patient was evaluated and treated and all questions answered.  DTI being cared for at facility, recommended offloading as is being done  Patient educated on diabetes. Discussed proper diabetic foot care and discussed risks and complications of disease. Educated patient in depth on reasons to return to the office immediately should he/she discover anything concerning or new on the feet. All questions answered. Discussed proper shoes as well.   Discussed the etiology and treatment options for the condition in detail with the patient.  Recommended debridement of the nails today. Sharp and mechanical debridement performed of all painful and mycotic nails today. Nails debrided in length and thickness using a nail nipper to level of comfort. Discussed treatment options including appropriate shoe gear. Follow up as needed for painful nails.   Return in about 3 months (around 03/14/2023) for at risk diabetic foot care.

## 2023-03-13 ENCOUNTER — Ambulatory Visit (INDEPENDENT_AMBULATORY_CARE_PROVIDER_SITE_OTHER): Payer: Medicare Other | Admitting: Podiatry

## 2023-03-13 ENCOUNTER — Encounter: Payer: Self-pay | Admitting: Podiatry

## 2023-03-13 DIAGNOSIS — E1122 Type 2 diabetes mellitus with diabetic chronic kidney disease: Secondary | ICD-10-CM

## 2023-03-13 DIAGNOSIS — M79675 Pain in left toe(s): Secondary | ICD-10-CM

## 2023-03-13 DIAGNOSIS — N186 End stage renal disease: Secondary | ICD-10-CM

## 2023-03-13 DIAGNOSIS — B351 Tinea unguium: Secondary | ICD-10-CM

## 2023-03-13 DIAGNOSIS — M79674 Pain in right toe(s): Secondary | ICD-10-CM

## 2023-03-13 DIAGNOSIS — Z992 Dependence on renal dialysis: Secondary | ICD-10-CM

## 2023-03-13 NOTE — Progress Notes (Signed)
This patient returns to my office for at risk foot care.  This patient requires this care by a professional since this patient will be at risk due to having type 2 diabetes and CKD. This patient is unable to cut nails herself since the patient cannot reach her nails.These nails are painful walking and wearing shoes.  This patient presents for at risk foot care today. Patient presents to the office in a wheelchair.  General Appearance  Alert, conversant and in no acute stress.  Vascular  Dorsalis pedis and posterior tibial  pulses are palpable  bilaterally.  Capillary return is within normal limits  bilaterally. Temperature is within normal limits  bilaterally.  Neurologic  Senn-Weinstein monofilament wire test within normal limits  bilaterally. Muscle power within normal limits bilaterally.  Nails Thick disfigured discolored nails with subungual debris  from hallux to fifth toes bilaterally. No evidence of bacterial infection or drainage bilaterally.  Orthopedic  No limitations of motion  feet .  No crepitus or effusions noted.  No bony pathology or digital deformities noted.  Skin  normotropic skin with no porokeratosis noted bilaterally.  No signs of infections or ulcers noted.     Onychomycosis  Pain in right toes  Pain in left toes  Consent was obtained for treatment procedures.   Mechanical debridement of nails 1-5  bilaterally performed with a nail nipper.  Filed with dremel without incident.    Return office visit      3 months                Told patient to return for periodic foot care and evaluation due to potential at risk complications.   Helane Gunther DPM

## 2023-05-12 ENCOUNTER — Ambulatory Visit: Payer: 59 | Admitting: Family Medicine

## 2023-06-12 ENCOUNTER — Ambulatory Visit (INDEPENDENT_AMBULATORY_CARE_PROVIDER_SITE_OTHER): Payer: Medicare Other | Admitting: Podiatry

## 2023-06-12 ENCOUNTER — Encounter: Payer: Self-pay | Admitting: Podiatry

## 2023-06-12 DIAGNOSIS — B351 Tinea unguium: Secondary | ICD-10-CM

## 2023-06-12 DIAGNOSIS — E1122 Type 2 diabetes mellitus with diabetic chronic kidney disease: Secondary | ICD-10-CM | POA: Diagnosis not present

## 2023-06-12 DIAGNOSIS — M79675 Pain in left toe(s): Secondary | ICD-10-CM | POA: Diagnosis not present

## 2023-06-12 DIAGNOSIS — Z992 Dependence on renal dialysis: Secondary | ICD-10-CM

## 2023-06-12 DIAGNOSIS — N186 End stage renal disease: Secondary | ICD-10-CM

## 2023-06-12 DIAGNOSIS — M79674 Pain in right toe(s): Secondary | ICD-10-CM | POA: Diagnosis not present

## 2023-06-12 NOTE — Progress Notes (Signed)
 This patient returns to my office for at risk foot care.  This patient requires this care by a professional since this patient will be at risk due to having type 2 diabetes and CKD. This patient is unable to cut nails herself since the patient cannot reach her nails.These nails are painful walking and wearing shoes.  This patient presents for at risk foot care today. Patient presents to the office in a wheelchair.  General Appearance  Alert, conversant and in no acute stress.  Vascular  Dorsalis pedis and posterior tibial  pulses are palpable  bilaterally.  Capillary return is within normal limits  bilaterally. Temperature is within normal limits  bilaterally.  Neurologic  Senn-Weinstein monofilament wire test within normal limits  bilaterally. Muscle power within normal limits bilaterally.  Nails Thick disfigured discolored nails with subungual debris  from hallux to fifth toes bilaterally. No evidence of bacterial infection or drainage bilaterally.  Orthopedic  No limitations of motion  feet .  No crepitus or effusions noted.  No bony pathology or digital deformities noted.  Skin  normotropic skin with no porokeratosis noted bilaterally.  No signs of infections or ulcers noted.     Onychomycosis  Pain in right toes  Pain in left toes  Consent was obtained for treatment procedures.   Mechanical debridement of nails 1-5  bilaterally performed with a nail nipper.  Filed with dremel without incident.    Return office visit      4  months                Told patient to return for periodic foot care and evaluation due to potential at risk complications.   Cordella Bold DPM

## 2023-06-13 ENCOUNTER — Ambulatory Visit: Payer: Medicare Other | Admitting: Podiatry

## 2023-07-16 ENCOUNTER — Telehealth: Payer: Self-pay | Admitting: Family Medicine

## 2023-07-16 ENCOUNTER — Encounter: Payer: Self-pay | Admitting: Family Medicine

## 2023-07-16 NOTE — Telephone Encounter (Signed)
 Attempted to call patient, wrong number in chart. Sent letter in mail informing pt of need to reschedule 11/06/23 appt - NP out

## 2023-09-11 ENCOUNTER — Ambulatory Visit: Payer: Medicare Other | Admitting: Podiatry

## 2023-11-06 ENCOUNTER — Ambulatory Visit: Payer: 59 | Admitting: Family Medicine

## 2024-02-05 ENCOUNTER — Ambulatory Visit: Admitting: Podiatry

## 2024-02-05 ENCOUNTER — Encounter: Payer: Self-pay | Admitting: Podiatry

## 2024-02-05 DIAGNOSIS — N186 End stage renal disease: Secondary | ICD-10-CM | POA: Diagnosis not present

## 2024-02-05 DIAGNOSIS — E1122 Type 2 diabetes mellitus with diabetic chronic kidney disease: Secondary | ICD-10-CM | POA: Diagnosis not present

## 2024-02-05 DIAGNOSIS — Z992 Dependence on renal dialysis: Secondary | ICD-10-CM

## 2024-02-05 DIAGNOSIS — M79674 Pain in right toe(s): Secondary | ICD-10-CM

## 2024-02-05 DIAGNOSIS — B351 Tinea unguium: Secondary | ICD-10-CM | POA: Diagnosis not present

## 2024-02-05 DIAGNOSIS — M79675 Pain in left toe(s): Secondary | ICD-10-CM

## 2024-02-05 NOTE — Progress Notes (Addendum)
 This patient returns to my office for at risk foot care.  This patient requires this care by a professional since this patient will be at risk due to having type 2 diabetes and CKD. This patient is unable to cut nails herself since the patient cannot reach her nails.These nails are painful walking and wearing shoes.  This patient presents for at risk foot care today. Patient presents to the office in a wheelchair.  General Appearance  Alert, conversant and in no acute stress.  Vascular  Dorsalis pedis and posterior tibial  pulses are palpable  bilaterally.  Capillary return is within normal limits  bilaterally. Temperature is within normal limits  bilaterally.  Neurologic  Senn-Weinstein monofilament wire test within normal limits  bilaterally. Muscle power within normal limits bilaterally.  Nails Thick disfigured discolored nails with subungual debris  from hallux to fifth toes bilaterally. No evidence of bacterial infection or drainage bilaterally.  Orthopedic  No limitations of motion  feet .  No crepitus or effusions noted.  No bony pathology or digital deformities noted.  Skin  normotropic skin with no porokeratosis noted bilaterally.  No signs of infections or ulcers noted.     Onychomycosis  Pain in right toes  Pain in left toes  Consent was obtained for treatment procedures.   Mechanical debridement of nails 1-5  bilaterally performed with a nail nipper.  Filed with dremel without incident.    Return office visit      6  months                Told patient to return for periodic foot care and evaluation due to potential at risk complications.   Cordella Bold DPM  tomma

## 2024-04-20 ENCOUNTER — Encounter (HOSPITAL_COMMUNITY): Payer: Self-pay

## 2024-04-22 ENCOUNTER — Other Ambulatory Visit: Payer: Self-pay

## 2024-04-22 ENCOUNTER — Encounter (HOSPITAL_COMMUNITY): Admission: RE | Disposition: A | Payer: Self-pay | Attending: Vascular Surgery

## 2024-04-22 ENCOUNTER — Encounter (HOSPITAL_COMMUNITY): Payer: Self-pay | Admitting: Vascular Surgery

## 2024-04-22 ENCOUNTER — Ambulatory Visit (HOSPITAL_COMMUNITY)
Admission: RE | Admit: 2024-04-22 | Discharge: 2024-04-22 | Disposition: A | Discharge status: Home / Self Care | Attending: Vascular Surgery | Admitting: Vascular Surgery

## 2024-04-22 DIAGNOSIS — Z79899 Other long term (current) drug therapy: Secondary | ICD-10-CM | POA: Diagnosis not present

## 2024-04-22 DIAGNOSIS — Z7982 Long term (current) use of aspirin: Secondary | ICD-10-CM | POA: Insufficient documentation

## 2024-04-22 DIAGNOSIS — Z992 Dependence on renal dialysis: Secondary | ICD-10-CM | POA: Insufficient documentation

## 2024-04-22 DIAGNOSIS — Y832 Surgical operation with anastomosis, bypass or graft as the cause of abnormal reaction of the patient, or of later complication, without mention of misadventure at the time of the procedure: Secondary | ICD-10-CM | POA: Diagnosis not present

## 2024-04-22 DIAGNOSIS — T82898A Other specified complication of vascular prosthetic devices, implants and grafts, initial encounter: Secondary | ICD-10-CM

## 2024-04-22 DIAGNOSIS — I120 Hypertensive chronic kidney disease with stage 5 chronic kidney disease or end stage renal disease: Secondary | ICD-10-CM | POA: Insufficient documentation

## 2024-04-22 DIAGNOSIS — Z87891 Personal history of nicotine dependence: Secondary | ICD-10-CM | POA: Insufficient documentation

## 2024-04-22 DIAGNOSIS — T82858A Stenosis of vascular prosthetic devices, implants and grafts, initial encounter: Secondary | ICD-10-CM | POA: Insufficient documentation

## 2024-04-22 DIAGNOSIS — N186 End stage renal disease: Secondary | ICD-10-CM | POA: Diagnosis not present

## 2024-04-22 DIAGNOSIS — E1122 Type 2 diabetes mellitus with diabetic chronic kidney disease: Secondary | ICD-10-CM | POA: Insufficient documentation

## 2024-04-22 HISTORY — PX: A/V FISTULAGRAM: CATH118298

## 2024-04-22 HISTORY — PX: VENOUS ANGIOPLASTY: CATH118376

## 2024-04-22 LAB — GLUCOSE, CAPILLARY: Glucose-Capillary: 82 mg/dL (ref 70–99)

## 2024-04-22 SURGERY — A/V FISTULAGRAM
Anesthesia: LOCAL | Site: Arm Upper | Laterality: Left

## 2024-04-22 MED ORDER — HEPARIN (PORCINE) IN NACL 1000-0.9 UT/500ML-% IV SOLN
INTRAVENOUS | Status: DC | PRN
  Administered 2024-04-22: 10:00:00 500 mL

## 2024-04-22 MED ORDER — LIDOCAINE HCL (PF) 1 % IJ SOLN
INTRAMUSCULAR | Status: AC
  Filled 2024-04-22: qty 30

## 2024-04-22 MED ORDER — IODIXANOL 320 MG/ML IV SOLN
INTRAVENOUS | Status: DC | PRN
  Administered 2024-04-22: 10:00:00 35 mL via INTRAVENOUS

## 2024-04-22 MED ORDER — LIDOCAINE HCL (PF) 1 % IJ SOLN
INTRAMUSCULAR | Status: DC | PRN
  Administered 2024-04-22: 10:00:00 2 mL via SUBCUTANEOUS

## 2024-04-22 SURGICAL SUPPLY — 12 items
BALLOON MUSTANG 7X60X75 (BALLOONS) IMPLANT
BALLOON MUSTANG 8.0X40 75 (BALLOONS) IMPLANT
DEVICE INFLATION ENCORE 26 (MISCELLANEOUS) IMPLANT
KIT MICROPUNCTURE NIT STIFF (SHEATH) IMPLANT
KIT PV (KITS) ×2 IMPLANT
MAT PREVALON FULL STRYKER (MISCELLANEOUS) IMPLANT
SHEATH PINNACLE R/O II 6F 4CM (SHEATH) IMPLANT
SHEATH PROBE COVER 6X72 (BAG) IMPLANT
STOPCOCK MORSE 400PSI 3WAY (MISCELLANEOUS) IMPLANT
TRAY PV CATH (CUSTOM PROCEDURE TRAY) ×2 IMPLANT
TUBING CIL FLEX 10 FLL-RA (TUBING) IMPLANT
WIRE BENTSON .035X145CM (WIRE) IMPLANT

## 2024-04-22 NOTE — H&P (Signed)
 H+P  History of Present Illness: This is a 63 y.o. female with history of end-stage renal disease on dialysis via two-stage left arm cephalic vein fistula.  She states the fistula continues to work for dialysis and was used yesterday however they are having difficulty with cannulation.  She is here today to discuss options regarding her left arm fistula malfunction.  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   Aortic atherosclerosis    Arthritis of knee    bilateral   Cerebellar cerebrovascular accident (CVA) without late effect 09/23/2019   Coagulation defect, unspecified 02/19/2019   Diabetes mellitus    type 2 - no meds   Diabetic gastroparesis (HCC)    Diverticulosis    ESRD (end stage renal disease) (HCC)    Fibroid uterus    GERD (gastroesophageal reflux disease)    Hyperlipidemia    Hypertension    Hypertensive emergency 02/05/2019   Hypokalemia    IDA (iron  deficiency anemia)    Iron  deficiency anemia, unspecified 02/24/2019   Nausea    Nausea and vomiting in adult 03/13/2022   Paroxysmal ventricular tachycardia (HCC)    Renal disorder    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)    Umbilical hernia    Wears dentures     Past Surgical History:  Procedure Laterality Date   AV FISTULA PLACEMENT Left 02/17/2019   Procedure: ARTERIOVENOUS (AV) FISTULA CREATION LEFT UPPER ARM;  Surgeon: Sheree Penne Bruckner, MD;  Location: [facility] OR;  Service: Vascular;  Laterality: Left;   CATARACT EXTRACTION     right eye   CESAREAN SECTION     x2   CHOLECYSTECTOMY     laparoscopic   CORONARY ANGIOGRAPHY N/A 07/25/2022   Procedure: CORONARY ANGIOGRAPHY;  Surgeon: Jordan, [name], MD;  Location[facility] INVASIVE CV LAB;  Service: Cardiovascular;  Laterality: N/A;   FISTULA SUPERFICIALIZATION Left 04/06/2019   Procedure: FISTULA SUPERFICIALIZATION LEFT BRACHIOCEPHALIC;  Surgeon: Sheree Penne Bruckner,  MD;  Location: [facility] OR;  Service: Vascular;  Laterality: Left;  Transposition left arm brachiocephalic fistula.   IR FLUORO GUIDE CV LINE RIGHT  02/09/2019   IR FLUORO GUIDE CV LINE RIGHT  03/05/2019   IR US  GUIDE VASC ACCESS RIGHT  02/09/2019   MULTIPLE TOOTH EXTRACTIONS     REDUCTION MAMMAPLASTY Bilateral    TEE WITHOUT CARDIOVERSION N/A 07/26/2022   Procedure: TRANSESOPHAGEAL ECHOCARDIOGRAM (TEE);  Surgeon: Francyne Headland, MD;  Location: [facility] ENDOSCOPY;  Service: Cardiovascular;  Laterality: N/A;    Allergies[1]  Prior to Admission medications  Medication Sig Start Date End Date Taking? Authorizing Provider  Accu-Chek Softclix Lancets lancets Use as instructed 09/29/18   Diallo, Irving, MD  acetaminophen  (TYLENOL ) 500 MG tablet Take 1,000 mg by mouth every 6 (six) hours as needed for moderate pain.    [provider]  amoxicillin  (AMOXIL ) 500 MG tablet Take 4 tablets by mouth one hour prior to dental appointments 11/05/22   Dann Candyce RAMAN, MD  aspirin  EC 81 MG tablet Take 1 tablet (81 mg total) by mouth daily. Swallow whole. 07/31/22   Macario Dorothyann HERO, MD  atorvastatin  (LIPITOR) 40 MG tablet Take by mouth. 09/27/22   [provider]  b complex-vitamin c-folic acid  (NEPHRO-VITE) 0.8 MG TABS tablet Take 1 tablet by mouth at bedtime.    [provider]  Calcium  Acetate 667 MG TABS Take 667 mg by mouth in the morning, at noon, and at bedtime.  [provider]  calcium  carbonate (TUMS - DOSED IN MG ELEMENTAL CALCIUM ) 500 MG chewable tablet Chew 1-2 tablets (200-400 mg of elemental calcium  total) by mouth 3 (three) times daily as needed for indigestion or heartburn (Abdominal pain). Patient taking differently: Chew 1,000 mg by mouth 3 (three) times daily as needed for indigestion or heartburn. 07/31/22   Macario Dorothyann HERO, MD  fluticasone  (FLONASE ) 50 MCG/ACT nasal spray Place 2 sprays into both nostrils daily. 03/05/22   Elicia Hamlet, MD  glucose blood  (ACCU-CHEK AVIVA PLUS) test strip Use as instructed 09/29/18   Diallo, Irving, MD  isosorbide -hydrALAZINE  (BIDIL ) 20-37.5 MG tablet Take 1 tablet by mouth in the morning and at bedtime.    [provider]  Lancets (ACCU-CHEK SOFT TOUCH) lancets Use to check sugars three times a day 09/24/18   Diallo, Irving, MD  metoCLOPramide  (REGLAN ) 10 MG tablet Take 1 tablet (10 mg total) by mouth 4 (four) times daily -  before meals and at bedtime. Take 1/2 to 1 tablet prn Patient taking differently: Take 10 mg by mouth 4 (four) times daily -  before meals and at bedtime. PRN order: take 10 mg by mouth every day as needed nausea 05/28/22   Pyrtle, Gordy HERO, MD  metoprolol  tartrate (LOPRESSOR ) 25 MG tablet Take 1 tablet (25 mg total) by mouth 2 (two) times daily. 10/08/22   Orlando Pond, DO  nitroGLYCERIN  (NITROSTAT ) 0.4 MG SL tablet Place 1 tablet (0.4 mg total) under the tongue every 5 (five) minutes as needed for up to 10 days for chest pain. 07/31/22 02/05/24  Macario Dorothyann HERO, MD  Nutritional Supplements (FEEDING SUPPLEMENT, NEPRO CARB STEADY,) LIQD Take 237 mLs by mouth in the morning and at bedtime.    [provider]  pantoprazole  (PROTONIX ) 40 MG tablet Take 1 tablet (40 mg total) by mouth daily. Patient taking differently: Take 40 mg by mouth at bedtime. 05/28/22   Pyrtle, Gordy HERO, MD  polyethylene glycol (MIRALAX  / GLYCOLAX ) 17 g packet Take 17 g by mouth daily. 03/16/22   Izella Ismael NOVAK, MD  prochlorperazine  (COMPAZINE ) 5 MG tablet Take 1 tablet (5 mg total) by mouth every 6 (six) hours as needed for refractory nausea / vomiting. 10/08/22   Orlando Pond, DO    Social History   Socioeconomic History   Marital status: Legally Separated    Spouse name: Not on file   Number of children: 2   Years of education: 21   Highest education level: 12th grade  Occupational History   Occupation: housekeeping   Occupation: disabled  Tobacco Use   Smoking status: Former    Current  packs/day: 0.00    Types: Cigarettes    Start date: 10/16/1991    Quit date: 10/16/2011    Years since quitting: 12.5    Passive exposure: Past   Smokeless tobacco: Never   Tobacco comments:    1 pack will last a week  Vaping Use   Vaping status: Never Used  Substance and Sexual Activity   Alcohol use: No    Alcohol/week: 0.0 standard drinks of alcohol   Drug use: No   Sexual activity: Not Currently  Other Topics Concern   Not on file  Social History Narrative   Patient and her son live together in [place].    Patient has (1) son and (1) daughter- they are very close.    Patient walks 30 minutes a day for exercise.    Patient is very active in her  church community.    Social Drivers of Health   Tobacco Use: Medium Risk (02/05/2024)   Patient History    Smoking Tobacco Use: Former    Smokeless Tobacco Use: Never    Passive Exposure: Past  Physicist, Medical Strain: Low Risk (05/31/2021)   Overall Financial Resource Strain (CARDIA)    Difficulty of Paying Living Expenses: Not hard at all  Food Insecurity: No Food Insecurity (09/04/2022)   Hunger Vital Sign    Worried About Running Out of Food in the Last Year: Never true    Ran Out of Food in the Last Year: Never true  Transportation Needs: No Transportation Needs (09/04/2022)   PRAPARE - Administrator, Civil Service (Medical): No    Lack of Transportation (Non-Medical): No  Physical Activity: Sufficiently Active (05/31/2021)   Exercise Vital Sign    Days of Exercise per Week: 7 days    Minutes of Exercise per Session: 30 min  Stress: No Stress Concern Present (05/31/2021)   [employer] of Occupational Health - Occupational Stress Questionnaire    Feeling of Stress : Only a little  Social Connections: Moderately Integrated (05/31/2021)   Social Connection and Isolation Panel    Frequency of Communication with Friends and Family: More than three times a week    Frequency of Social Gatherings with Friends and  Family: More than three times a week    Attends Religious Services: More than 4 times per year    Active Member of [employer] or Organizations: Yes    Attends Banker Meetings: More than 4 times per year    Marital Status: Separated  Intimate Partner Violence: Not At Risk (09/04/2022)   Humiliation, Afraid, Rape, and Kick questionnaire    Fear of Current or Ex-Partner: No    Emotionally Abused: No    Physically Abused: No    Sexually Abused: No  Depression (PHQ2-9): Low Risk (11/12/2022)   Depression (PHQ2-9)    PHQ-2 Score: 0  Alcohol Screen: Low Risk (05/31/2021)   Alcohol Screen    Last Alcohol Screening Score (AUDIT): 0  Housing: Low Risk (09/04/2022)   Housing    Last Housing Risk Score: 0  Utilities: Not At Risk (09/04/2022)   AHC Utilities    Threatened with loss of utilities: No  Health Literacy: Not on file     Family History  Problem Relation Age of Onset   Hypertension Mother    Diabetes Mother    Cancer Father        lung   Colon cancer Neg Hx    Stomach cancer Neg Hx    Esophageal cancer Neg Hx     ROS: [x]  Positive   [ ]  Negative   [ ]  All sytems reviewed and are negative Cardiovascular: []  chest pain/pressure []  palpitations []  SOB lying flat []  DOE []  pain in legs while walking []  pain in legs at rest []  pain in legs at night []  non-healing ulcers []  hx of DVT []  swelling in legs  Pulmonary: []  productive cough []  asthma/wheezing []  home O2  Neurologic: []  weakness in []  arms []  legs []  numbness in []  arms []  legs []  hx of CVA []  mini stroke [] difficulty speaking or slurred speech []  temporary loss of vision in one eye []  dizziness  Hematologic: []  hx of cancer []  bleeding problems []  problems with blood clotting easily  Endocrine:   []  diabetes []  thyroid  disease  GI []  vomiting blood []  blood in stool  GU: []  CKD/renal failure []  HD--[]  M/W/F or []  T/T/S []  burning with urination [x]  Malfunction left arm  fistula  Psychiatric: []  anxiety []  depression  Musculoskeletal: []  arthritis []  joint pain  Integumentary: []  rashes []  ulcers  Constitutional: []  fever []  chills   Physical Examination Vitals:   04/22/24 0823  BP: (!) 122/56  Pulse: 91  Resp: 12  Temp: 97.6 F (36.4 C)  SpO2: 99%    Physical Exam HENT:     Head: Normocephalic.     Nose: Nose normal.  Eyes:     Pupils: Pupils are equal, round, and reactive to light.  Cardiovascular:     Rate and Rhythm: Normal rate.  Pulmonary:     Effort: Pulmonary effort is normal.  Abdominal:     General: Abdomen is flat.  Musculoskeletal:        General: Normal range of motion.     Cervical back: Normal range of motion.     Comments: Left arm AV fistula with an area of pseudoaneurysmal degeneration with pulsatility more cephalad in the arm that pulsatility is weak  Neurological:     Mental Status: She is alert.      CBC    Component Value Date/Time   WBC 10.9 (H) 10/07/2022 0038   RBC 3.75 (L) 10/07/2022 0038   HGB 10.6 (L) 10/07/2022 0038   HGB 11.4 11/12/2018 0949   HCT 34.2 (L) 10/07/2022 0038   HCT 34.6 11/12/2018 0949   PLT 328 10/07/2022 0038   PLT 404 11/12/2018 0949   MCV 91.2 10/07/2022 0038   MCV 78 (L) 11/12/2018 0949   MCH 28.3 10/07/2022 0038   MCHC 31.0 10/07/2022 0038   RDW 15.8 (H) 10/07/2022 0038   RDW 15.0 11/12/2018 0949   LYMPHSABS 1.1 10/05/2022 1721   MONOABS 0.4 10/05/2022 1721   EOSABS 0.0 10/05/2022 1721   BASOSABS 0.0 10/05/2022 1721    BMET    Component Value Date/Time   NA 137 10/07/2022 0038   NA 145 (H) 11/12/2018 0949   K 3.8 10/07/2022 0038   CL 93 (L) 10/07/2022 0038   CO2 29 10/07/2022 0038   GLUCOSE 125 (H) 10/07/2022 0038   BUN 28 (H) 10/07/2022 0038   BUN 34 (H) 11/12/2018 0949   CREATININE 7.19 (H) 10/07/2022 0038   CREATININE 1.19 (H) 09/28/2015 1524   CALCIUM  9.7 10/07/2022 0038   CALCIUM  9.6 08/05/2022 0835   GFRNONAA 6 (L) 10/07/2022 0038   GFRNONAA  51 (L) 09/28/2015 1524   GFRAA 4 (L) 10/11/2019 1347   GFRAA 59 (L) 09/28/2015 1524    COAGS: Lab Results  Component Value Date   INR 1.0 10/05/2022   INR 1.0 09/18/2019   INR 1.1 02/08/2019      ASSESSMENT/PLAN: This is a 63 y.o. female with history of end-stage renal disease dialyzing via left arm AV fistula has had difficulty with cannulation on dialysis. we have discussed her options including continued use versus proceeding with fistulogram.  She has elected for fistulogram with possible intervention.  We discussed the risk benefits alternatives and consent was signed.  Terron Merfeld C. Sheree, MD [facility] Office: [phone] Pager: [phone]     [1]  Allergies Allergen Reactions   Lisinopril Anaphylaxis and Other (See Comments)    angioedema   Venofer   [Ferric Oxide] Other (See Comments)     Back Pain   Camellia Swelling and Other (See Comments)    Angioedema    Jardiance  [Empagliflozin ]  Swelling and Rash

## 2024-04-22 NOTE — Op Note (Signed)
° ° °  Patient name: [name] MRN: [id number] DOB: [birth year] Sex: female  04/22/2024 Pre-operative Diagnosis: End-stage renal disease, malfunctioning left arm AV fistula with difficult cannulation Post-operative diagnosis:  Same Surgeon:  Penne BROCKS. Sheree, MD Procedure Performed: 1.  Percutaneous ultrasound-guided cannulation left arm AV fistula 2.  Left upper extremity fistulogram 3.  Plain balloon angioplasty left cephalic vein stenosis with 7 and 8 mm balloons  Indications: 63 year old female with end-stage renal disease on dialysis via left arm cephalic vein fistula which continues to work however is having difficulty with cannulation.  We have discussed her options she is elected for fistulogram with possible invention.  Findings: There is 1 area of stenosis between 2 areas of pseudoaneurysm approximately 5 cm distal to the arteriovenous anastomosis.  Stenosis initially measured 80% and was reduced to less than 10% after balloon angioplasty with 7 and 8 mm balloons.  There adjacent pseudoaneurysms on either side of the stenosis.  At completion there was a much improved thrill throughout the fistula.  Fistula is ready for use, it remains amenable to future percutaneous intervention.   Procedure:  The patient was identified in the holding area and taken to the heart and vascular procedure room.  The patient was then placed supine on the table and prepped and draped in the usual sterile fashion.  A time out was called.  Ultrasound was used to evaluate the left arm AV fistula near the antecubitum.  The area was anesthetized with 1% lidocaine  and cannulated with a micropuncture needle followed by wire and sheath.  Ultrasound images saved to permanent record.  Performed left upper extremity fistulogram with the above findings we placed a Bentson wire followed by 6 French sheath under fluoroscopic guidance.  We then performed balloon angioplasty of the stenosed area using 7 and 8 mm balloons and with  a 7 mm balloon inflated perform retrograde imaging to evaluate the arteriovenous anastomosis which was noted to be patent without flow-limiting stenosis.  At completion there was a much improved thrill throughout the fistula cephalad to the stenosis and much improved thrill by fistulogram.  We then removed the wire and sheath and the cannulation site was suture-ligated.  She tolerated the procedure without immediate complication.   Contrast: 35 cc  Waynette Towers C. Sheree, MD [facility] Office: [phone] Pager: [phone]

## 2024-06-24 ENCOUNTER — Ambulatory Visit (HOSPITAL_COMMUNITY): Admit: 2024-06-24 | Admitting: Surgery

## 2024-06-24 ENCOUNTER — Encounter (HOSPITAL_COMMUNITY): Payer: Self-pay

## 2024-08-05 ENCOUNTER — Ambulatory Visit: Admitting: Podiatry
